# Patient Record
Sex: Male | Born: 1940
Health system: Southern US, Community
[De-identification: ages and names within clinical notes are randomized; demographics above are authoritative.]

## PROBLEM LIST (undated history)

## (undated) DIAGNOSIS — G4733 Obstructive sleep apnea (adult) (pediatric): Secondary | ICD-10-CM

## (undated) DIAGNOSIS — D649 Anemia, unspecified: Secondary | ICD-10-CM

## (undated) DIAGNOSIS — I10 Essential (primary) hypertension: Secondary | ICD-10-CM

## (undated) DIAGNOSIS — S069XAA Unspecified intracranial injury with loss of consciousness status unknown, initial encounter: Secondary | ICD-10-CM

## (undated) DIAGNOSIS — G629 Polyneuropathy, unspecified: Secondary | ICD-10-CM

## (undated) DIAGNOSIS — E785 Hyperlipidemia, unspecified: Secondary | ICD-10-CM

## (undated) DIAGNOSIS — S069X9A Unspecified intracranial injury with loss of consciousness of unspecified duration, initial encounter: Secondary | ICD-10-CM

## (undated) DIAGNOSIS — Z950 Presence of cardiac pacemaker: Secondary | ICD-10-CM

## (undated) DIAGNOSIS — T7840XA Allergy, unspecified, initial encounter: Secondary | ICD-10-CM

## (undated) DIAGNOSIS — K219 Gastro-esophageal reflux disease without esophagitis: Secondary | ICD-10-CM

## (undated) DIAGNOSIS — M1990 Unspecified osteoarthritis, unspecified site: Secondary | ICD-10-CM

## (undated) HISTORY — DX: Allergy, unspecified, initial encounter: T78.40XA

## (undated) HISTORY — DX: Essential (primary) hypertension: I10

## (undated) HISTORY — DX: Anemia, unspecified: D64.9

## (undated) HISTORY — PX: APPENDECTOMY: SHX54

## (undated) HISTORY — PX: ROTATOR CUFF REPAIR: SHX139

## (undated) HISTORY — DX: Unspecified osteoarthritis, unspecified site: M19.90

## (undated) HISTORY — DX: Polyneuropathy, unspecified: G62.9

## (undated) HISTORY — PX: COLONOSCOPY: SHX174

## (undated) HISTORY — DX: Hyperlipidemia, unspecified: E78.5

## (undated) HISTORY — DX: Obstructive sleep apnea (adult) (pediatric): G47.33

## (undated) HISTORY — DX: Gastro-esophageal reflux disease without esophagitis: K21.9

---
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
## 1965-07-13 HISTORY — PX: THORACOTOMY: SUR1349

## 1998-04-08 ENCOUNTER — Ambulatory Visit (HOSPITAL_COMMUNITY): Admission: RE | Admit: 1998-04-08 | Discharge: 1998-04-08 | Payer: Self-pay | Admitting: Ophthalmology

## 1999-07-14 DIAGNOSIS — G629 Polyneuropathy, unspecified: Secondary | ICD-10-CM

## 1999-07-14 HISTORY — DX: Polyneuropathy, unspecified: G62.9

## 2002-05-29 ENCOUNTER — Encounter: Payer: Self-pay | Admitting: Internal Medicine

## 2002-05-29 LAB — CONVERTED CEMR LAB

## 2002-06-12 LAB — HM COLONOSCOPY

## 2002-06-15 ENCOUNTER — Encounter: Payer: Self-pay | Admitting: Internal Medicine

## 2002-10-02 ENCOUNTER — Encounter: Payer: Self-pay | Admitting: Internal Medicine

## 2002-10-02 ENCOUNTER — Encounter: Admission: RE | Admit: 2002-10-02 | Discharge: 2002-10-02 | Payer: Self-pay | Admitting: Internal Medicine

## 2002-11-14 ENCOUNTER — Encounter: Payer: Self-pay | Admitting: Urology

## 2002-11-17 ENCOUNTER — Ambulatory Visit (HOSPITAL_COMMUNITY): Admission: RE | Admit: 2002-11-17 | Discharge: 2002-11-17 | Payer: Self-pay | Admitting: Urology

## 2002-12-27 ENCOUNTER — Inpatient Hospital Stay (HOSPITAL_COMMUNITY): Admission: EM | Admit: 2002-12-27 | Discharge: 2002-12-29 | Payer: Self-pay | Admitting: Emergency Medicine

## 2002-12-27 ENCOUNTER — Encounter: Payer: Self-pay | Admitting: Orthopedic Surgery

## 2002-12-28 ENCOUNTER — Encounter: Payer: Self-pay | Admitting: Orthopedic Surgery

## 2003-12-19 ENCOUNTER — Encounter: Admission: RE | Admit: 2003-12-19 | Discharge: 2003-12-19 | Payer: Self-pay | Admitting: Internal Medicine

## 2004-07-13 HISTORY — PX: ESOPHAGOGASTRODUODENOSCOPY: SHX1529

## 2004-10-03 ENCOUNTER — Ambulatory Visit: Payer: Self-pay | Admitting: Internal Medicine

## 2004-10-06 ENCOUNTER — Ambulatory Visit: Payer: Self-pay | Admitting: Internal Medicine

## 2004-10-16 ENCOUNTER — Ambulatory Visit (HOSPITAL_BASED_OUTPATIENT_CLINIC_OR_DEPARTMENT_OTHER): Admission: RE | Admit: 2004-10-16 | Discharge: 2004-10-16 | Payer: Self-pay | Admitting: Internal Medicine

## 2004-10-16 ENCOUNTER — Encounter: Payer: Self-pay | Admitting: Pulmonary Disease

## 2004-10-30 ENCOUNTER — Ambulatory Visit: Payer: Self-pay | Admitting: Pulmonary Disease

## 2005-01-09 ENCOUNTER — Ambulatory Visit: Payer: Self-pay | Admitting: Pulmonary Disease

## 2005-02-27 ENCOUNTER — Ambulatory Visit: Payer: Self-pay | Admitting: Pulmonary Disease

## 2005-03-31 ENCOUNTER — Ambulatory Visit: Payer: Self-pay | Admitting: Pulmonary Disease

## 2005-04-06 ENCOUNTER — Ambulatory Visit: Payer: Self-pay | Admitting: Internal Medicine

## 2005-04-22 ENCOUNTER — Ambulatory Visit: Payer: Self-pay | Admitting: Internal Medicine

## 2005-05-06 ENCOUNTER — Ambulatory Visit: Payer: Self-pay | Admitting: Internal Medicine

## 2005-05-14 ENCOUNTER — Encounter (INDEPENDENT_AMBULATORY_CARE_PROVIDER_SITE_OTHER): Payer: Self-pay | Admitting: Specialist

## 2005-05-14 ENCOUNTER — Ambulatory Visit: Payer: Self-pay | Admitting: Internal Medicine

## 2005-05-25 ENCOUNTER — Ambulatory Visit: Payer: Self-pay | Admitting: Internal Medicine

## 2005-05-29 ENCOUNTER — Encounter: Admission: RE | Admit: 2005-05-29 | Discharge: 2005-05-29 | Payer: Self-pay | Admitting: Orthopedic Surgery

## 2005-07-24 ENCOUNTER — Ambulatory Visit: Payer: Self-pay | Admitting: Internal Medicine

## 2005-08-19 ENCOUNTER — Ambulatory Visit: Payer: Self-pay | Admitting: Internal Medicine

## 2005-08-20 ENCOUNTER — Ambulatory Visit: Payer: Self-pay | Admitting: Internal Medicine

## 2005-08-21 ENCOUNTER — Ambulatory Visit: Payer: Self-pay | Admitting: Internal Medicine

## 2005-08-25 ENCOUNTER — Ambulatory Visit: Payer: Self-pay | Admitting: Internal Medicine

## 2005-09-22 ENCOUNTER — Ambulatory Visit: Payer: Self-pay | Admitting: Internal Medicine

## 2005-11-03 ENCOUNTER — Ambulatory Visit: Payer: Self-pay | Admitting: Internal Medicine

## 2005-12-02 ENCOUNTER — Ambulatory Visit: Payer: Self-pay | Admitting: Internal Medicine

## 2006-01-06 ENCOUNTER — Ambulatory Visit: Payer: Self-pay | Admitting: Internal Medicine

## 2006-02-03 ENCOUNTER — Ambulatory Visit: Payer: Self-pay | Admitting: Internal Medicine

## 2006-03-16 ENCOUNTER — Ambulatory Visit: Payer: Self-pay | Admitting: Internal Medicine

## 2006-04-14 ENCOUNTER — Ambulatory Visit: Payer: Self-pay | Admitting: Internal Medicine

## 2006-04-15 DIAGNOSIS — E118 Type 2 diabetes mellitus with unspecified complications: Secondary | ICD-10-CM | POA: Insufficient documentation

## 2006-04-15 DIAGNOSIS — D519 Vitamin B12 deficiency anemia, unspecified: Secondary | ICD-10-CM | POA: Insufficient documentation

## 2006-04-15 DIAGNOSIS — J309 Allergic rhinitis, unspecified: Secondary | ICD-10-CM | POA: Insufficient documentation

## 2006-04-15 DIAGNOSIS — I10 Essential (primary) hypertension: Secondary | ICD-10-CM | POA: Insufficient documentation

## 2006-05-19 ENCOUNTER — Ambulatory Visit: Payer: Self-pay | Admitting: Internal Medicine

## 2006-06-23 ENCOUNTER — Ambulatory Visit: Payer: Self-pay | Admitting: Internal Medicine

## 2006-07-28 ENCOUNTER — Ambulatory Visit: Payer: Self-pay | Admitting: Internal Medicine

## 2006-08-23 ENCOUNTER — Ambulatory Visit: Payer: Self-pay | Admitting: Internal Medicine

## 2006-09-22 ENCOUNTER — Ambulatory Visit: Payer: Self-pay | Admitting: Internal Medicine

## 2006-10-29 ENCOUNTER — Ambulatory Visit: Payer: Self-pay | Admitting: Internal Medicine

## 2006-10-29 LAB — CONVERTED CEMR LAB: Vitamin B-12: 394 pg/mL (ref 211–911)

## 2006-12-08 ENCOUNTER — Ambulatory Visit: Payer: Self-pay | Admitting: Internal Medicine

## 2006-12-21 ENCOUNTER — Ambulatory Visit: Payer: Self-pay | Admitting: Internal Medicine

## 2006-12-23 ENCOUNTER — Encounter: Payer: Self-pay | Admitting: Internal Medicine

## 2006-12-23 DIAGNOSIS — H47019 Ischemic optic neuropathy, unspecified eye: Secondary | ICD-10-CM | POA: Insufficient documentation

## 2007-01-13 ENCOUNTER — Ambulatory Visit: Payer: Self-pay | Admitting: Internal Medicine

## 2007-02-22 ENCOUNTER — Ambulatory Visit: Payer: Self-pay | Admitting: Internal Medicine

## 2007-03-02 ENCOUNTER — Ambulatory Visit: Payer: Self-pay | Admitting: Internal Medicine

## 2007-03-04 LAB — CONVERTED CEMR LAB
Basophils Absolute: 0 10*3/uL (ref 0.0–0.1)
Basophils Relative: 0.5 % (ref 0.0–1.0)
Eosinophils Absolute: 0.2 10*3/uL (ref 0.0–0.6)
Eosinophils Relative: 3.6 % (ref 0.0–5.0)
Ferritin: 61.4 ng/mL (ref 22.0–322.0)
Folate: 9.8 ng/mL
HCT: 36.2 % — ABNORMAL LOW (ref 39.0–52.0)
Hemoglobin: 12.4 g/dL — ABNORMAL LOW (ref 13.0–17.0)
Iron: 56 ug/dL (ref 42–165)
Lymphocytes Relative: 23 % (ref 12.0–46.0)
MCHC: 34.1 g/dL (ref 30.0–36.0)
MCV: 82.8 fL (ref 78.0–100.0)
Monocytes Absolute: 0.7 10*3/uL (ref 0.2–0.7)
Monocytes Relative: 11 % (ref 3.0–11.0)
Neutro Abs: 4 10*3/uL (ref 1.4–7.7)
Neutrophils Relative %: 61.9 % (ref 43.0–77.0)
Platelets: 199 10*3/uL (ref 150–400)
RBC: 4.37 M/uL (ref 4.22–5.81)
RDW: 12.9 % (ref 11.5–14.6)
Transferrin: 249.4 mg/dL (ref >=212.0)
Vitamin B-12: 529 pg/mL (ref 211–911)
WBC: 6.3 10*3/uL (ref 4.5–10.5)

## 2007-04-12 ENCOUNTER — Ambulatory Visit: Payer: Self-pay | Admitting: Internal Medicine

## 2007-05-10 ENCOUNTER — Ambulatory Visit: Payer: Self-pay | Admitting: Internal Medicine

## 2007-06-01 ENCOUNTER — Ambulatory Visit: Payer: Self-pay | Admitting: Family Medicine

## 2007-06-21 ENCOUNTER — Ambulatory Visit: Payer: Self-pay | Admitting: Internal Medicine

## 2007-06-21 DIAGNOSIS — N400 Enlarged prostate without lower urinary tract symptoms: Secondary | ICD-10-CM | POA: Insufficient documentation

## 2007-07-27 ENCOUNTER — Ambulatory Visit: Payer: Self-pay | Admitting: Internal Medicine

## 2007-07-27 DIAGNOSIS — E785 Hyperlipidemia, unspecified: Secondary | ICD-10-CM | POA: Insufficient documentation

## 2007-07-29 LAB — CONVERTED CEMR LAB
Basophils Absolute: 0 10*3/uL (ref 0.0–0.1)
Basophils Relative: 0.6 % (ref 0.0–1.0)
Eosinophils Absolute: 0.2 10*3/uL (ref 0.0–0.6)
Eosinophils Relative: 3.7 % (ref 0.0–5.0)
Ferritin: 47 ng/mL (ref 22.0–322.0)
HCT: 34.9 % — ABNORMAL LOW (ref 39.0–52.0)
Hemoglobin: 11.6 g/dL — ABNORMAL LOW (ref 13.0–17.0)
Iron: 77 ug/dL (ref 42–165)
Lymphocytes Relative: 22.8 % (ref 12.0–46.0)
MCHC: 33.4 g/dL (ref 30.0–36.0)
MCV: 83.7 fL (ref 78.0–100.0)
Monocytes Absolute: 0.6 10*3/uL (ref 0.2–0.7)
Monocytes Relative: 9.7 % (ref 3.0–11.0)
Neutro Abs: 4.1 10*3/uL (ref 1.4–7.7)
Neutrophils Relative %: 63.2 % (ref 43.0–77.0)
Platelets: 179 10*3/uL (ref 150–400)
RBC: 4.17 M/uL — ABNORMAL LOW (ref 4.22–5.81)
RDW: 13.1 % (ref 11.5–14.6)
Saturation Ratios: 23.3 % (ref 20.0–50.0)
Transferrin: 235.9 mg/dL (ref >=212.0)
WBC: 6.3 10*3/uL (ref 4.5–10.5)

## 2007-08-24 ENCOUNTER — Ambulatory Visit: Payer: Self-pay | Admitting: Internal Medicine

## 2007-09-21 ENCOUNTER — Ambulatory Visit: Payer: Self-pay | Admitting: Internal Medicine

## 2007-10-27 ENCOUNTER — Ambulatory Visit: Payer: Self-pay | Admitting: Internal Medicine

## 2007-10-28 ENCOUNTER — Telehealth: Payer: Self-pay | Admitting: Internal Medicine

## 2007-11-23 ENCOUNTER — Ambulatory Visit: Payer: Self-pay | Admitting: Internal Medicine

## 2007-12-12 ENCOUNTER — Telehealth: Payer: Self-pay | Admitting: Internal Medicine

## 2007-12-13 ENCOUNTER — Telehealth (INDEPENDENT_AMBULATORY_CARE_PROVIDER_SITE_OTHER): Payer: Self-pay | Admitting: *Deleted

## 2007-12-15 ENCOUNTER — Telehealth (INDEPENDENT_AMBULATORY_CARE_PROVIDER_SITE_OTHER): Payer: Self-pay | Admitting: *Deleted

## 2007-12-21 ENCOUNTER — Encounter: Payer: Self-pay | Admitting: Internal Medicine

## 2007-12-28 ENCOUNTER — Ambulatory Visit: Payer: Self-pay | Admitting: Internal Medicine

## 2008-02-07 ENCOUNTER — Ambulatory Visit: Payer: Self-pay | Admitting: Internal Medicine

## 2008-03-07 ENCOUNTER — Ambulatory Visit: Payer: Self-pay | Admitting: Internal Medicine

## 2008-04-05 ENCOUNTER — Ambulatory Visit: Payer: Self-pay | Admitting: Internal Medicine

## 2008-05-23 ENCOUNTER — Ambulatory Visit: Payer: Self-pay | Admitting: Internal Medicine

## 2008-06-20 ENCOUNTER — Ambulatory Visit: Payer: Self-pay | Admitting: Internal Medicine

## 2008-07-19 ENCOUNTER — Ambulatory Visit: Payer: Self-pay | Admitting: Internal Medicine

## 2008-07-23 LAB — CONVERTED CEMR LAB
ALT: 23 units/L (ref 0–53)
AST: 22 units/L (ref 0–37)
Albumin: 3.8 g/dL (ref 3.5–5.2)
Alkaline Phosphatase: 49 units/L (ref 39–117)
BUN: 22 mg/dL (ref 6–23)
Basophils Absolute: 0 10*3/uL (ref 0.0–0.1)
Basophils Relative: 0.7 % (ref 0.0–3.0)
Bilirubin, Direct: 0.1 mg/dL (ref 0.0–0.3)
CO2: 31 meq/L (ref 19–32)
Calcium: 9.5 mg/dL (ref 8.4–10.5)
Chloride: 103 meq/L (ref 96–112)
Cholesterol: 108 mg/dL (ref 0–200)
Creatinine, Ser: 0.9 mg/dL (ref 0.4–1.5)
Eosinophils Absolute: 0.3 10*3/uL (ref 0.0–0.7)
Eosinophils Relative: 5 % (ref 0.0–5.0)
GFR calc Af Amer: 108 mL/min
GFR calc non Af Amer: 89 mL/min
Glucose, Bld: 99 mg/dL (ref 70–99)
HCT: 36.2 % — ABNORMAL LOW (ref 39.0–52.0)
HDL: 55 mg/dL (ref >39.0)
Hemoglobin: 12.2 g/dL — ABNORMAL LOW (ref 13.0–17.0)
Hgb A1c MFr Bld: 6.4 % — ABNORMAL HIGH (ref 4.6–6.0)
LDL Cholesterol: 42 mg/dL (ref 0–99)
Lymphocytes Relative: 21.1 % (ref 12.0–46.0)
MCHC: 33.6 g/dL (ref 30.0–36.0)
MCV: 84.6 fL (ref 78.0–100.0)
Monocytes Absolute: 0.6 10*3/uL (ref 0.1–1.0)
Monocytes Relative: 11.7 % (ref 3.0–12.0)
Neutro Abs: 3.2 10*3/uL (ref 1.4–7.7)
Neutrophils Relative %: 61.5 % (ref 43.0–77.0)
Platelets: 142 10*3/uL — ABNORMAL LOW (ref 150–400)
Potassium: 4.7 meq/L (ref 3.5–5.1)
RBC: 4.28 M/uL (ref 4.22–5.81)
RDW: 13.3 % (ref 11.5–14.6)
Sodium: 140 meq/L (ref 135–145)
Total Bilirubin: 0.6 mg/dL (ref 0.3–1.2)
Total CHOL/HDL Ratio: 2
Total Protein: 6.4 g/dL (ref 6.0–8.3)
Triglycerides: 56 mg/dL (ref 0–149)
VLDL: 11 mg/dL (ref 0–40)
Vitamin B-12: 553 pg/mL (ref 211–911)
WBC: 5.2 10*3/uL (ref 4.5–10.5)

## 2008-08-14 ENCOUNTER — Ambulatory Visit: Payer: Self-pay | Admitting: Internal Medicine

## 2008-09-13 ENCOUNTER — Ambulatory Visit: Payer: Self-pay | Admitting: Pulmonary Disease

## 2008-09-13 DIAGNOSIS — G4730 Sleep apnea, unspecified: Secondary | ICD-10-CM | POA: Insufficient documentation

## 2008-09-13 DIAGNOSIS — G4733 Obstructive sleep apnea (adult) (pediatric): Secondary | ICD-10-CM | POA: Insufficient documentation

## 2008-09-19 ENCOUNTER — Ambulatory Visit: Payer: Self-pay | Admitting: Internal Medicine

## 2008-09-20 ENCOUNTER — Encounter: Payer: Self-pay | Admitting: Pulmonary Disease

## 2008-11-12 ENCOUNTER — Ambulatory Visit: Payer: Self-pay | Admitting: Internal Medicine

## 2008-12-19 ENCOUNTER — Telehealth: Payer: Self-pay | Admitting: Internal Medicine

## 2008-12-19 ENCOUNTER — Ambulatory Visit: Payer: Self-pay | Admitting: Internal Medicine

## 2009-01-16 ENCOUNTER — Ambulatory Visit: Payer: Self-pay | Admitting: Internal Medicine

## 2009-02-20 ENCOUNTER — Ambulatory Visit: Payer: Self-pay | Admitting: Internal Medicine

## 2009-03-27 ENCOUNTER — Ambulatory Visit: Payer: Self-pay | Admitting: Internal Medicine

## 2009-04-17 ENCOUNTER — Ambulatory Visit: Payer: Self-pay | Admitting: Internal Medicine

## 2009-05-13 ENCOUNTER — Ambulatory Visit: Payer: Self-pay | Admitting: Internal Medicine

## 2009-06-12 ENCOUNTER — Ambulatory Visit: Payer: Self-pay | Admitting: Internal Medicine

## 2009-06-12 LAB — HM DIABETES EYE EXAM

## 2009-07-13 HISTORY — PX: OTHER SURGICAL HISTORY: SHX169

## 2009-07-17 ENCOUNTER — Ambulatory Visit: Payer: Self-pay | Admitting: Internal Medicine

## 2009-07-17 LAB — HM DIABETES FOOT EXAM

## 2009-08-14 ENCOUNTER — Ambulatory Visit: Payer: Self-pay | Admitting: Internal Medicine

## 2009-09-11 ENCOUNTER — Ambulatory Visit: Payer: Self-pay | Admitting: Internal Medicine

## 2009-09-13 ENCOUNTER — Telehealth: Payer: Self-pay | Admitting: Internal Medicine

## 2009-10-24 ENCOUNTER — Ambulatory Visit: Payer: Self-pay | Admitting: Internal Medicine

## 2009-11-22 ENCOUNTER — Ambulatory Visit: Payer: Self-pay | Admitting: Internal Medicine

## 2009-11-25 LAB — CONVERTED CEMR LAB: Vit D, 25-Hydroxy: 22 ng/mL — ABNORMAL LOW (ref 30–89)

## 2009-12-04 ENCOUNTER — Ambulatory Visit: Payer: Self-pay | Admitting: Internal Medicine

## 2009-12-04 ENCOUNTER — Telehealth: Payer: Self-pay | Admitting: Internal Medicine

## 2009-12-25 ENCOUNTER — Ambulatory Visit: Payer: Self-pay | Admitting: Internal Medicine

## 2009-12-25 DIAGNOSIS — F329 Major depressive disorder, single episode, unspecified: Secondary | ICD-10-CM

## 2009-12-25 DIAGNOSIS — F32A Depression, unspecified: Secondary | ICD-10-CM | POA: Insufficient documentation

## 2009-12-27 ENCOUNTER — Ambulatory Visit: Payer: Self-pay | Admitting: Internal Medicine

## 2010-01-01 ENCOUNTER — Ambulatory Visit: Payer: Self-pay | Admitting: Internal Medicine

## 2010-01-08 ENCOUNTER — Ambulatory Visit: Payer: Self-pay | Admitting: Family Medicine

## 2010-01-16 ENCOUNTER — Ambulatory Visit
Admission: RE | Admit: 2010-01-16 | Discharge: 2010-01-16 | Payer: Self-pay | Source: Home / Self Care | Admitting: Orthopedic Surgery

## 2010-01-16 ENCOUNTER — Encounter: Payer: Self-pay | Admitting: Emergency Medicine

## 2010-01-16 ENCOUNTER — Ambulatory Visit: Payer: Self-pay | Admitting: Diagnostic Radiology

## 2010-01-17 ENCOUNTER — Encounter: Payer: Self-pay | Admitting: Internal Medicine

## 2010-02-05 ENCOUNTER — Ambulatory Visit: Payer: Self-pay | Admitting: Internal Medicine

## 2010-02-17 ENCOUNTER — Encounter: Payer: Self-pay | Admitting: Internal Medicine

## 2010-03-19 ENCOUNTER — Telehealth: Payer: Self-pay | Admitting: Internal Medicine

## 2010-03-19 ENCOUNTER — Ambulatory Visit: Payer: Self-pay | Admitting: Internal Medicine

## 2010-03-19 LAB — CONVERTED CEMR LAB
ALT: 27 units/L (ref 0–53)
AST: 26 units/L (ref 0–37)
Albumin: 3.7 g/dL (ref 3.5–5.2)
Alkaline Phosphatase: 59 units/L (ref 39–117)
BUN: 20 mg/dL (ref 6–23)
Basophils Absolute: 0 10*3/uL (ref 0.0–0.1)
Basophils Relative: 0.6 % (ref 0.0–3.0)
Bilirubin, Direct: 0.1 mg/dL (ref 0.0–0.3)
CO2: 33 meq/L — ABNORMAL HIGH (ref 19–32)
Calcium: 9.2 mg/dL (ref 8.4–10.5)
Chloride: 97 meq/L (ref 96–112)
Creatinine, Ser: 0.9 mg/dL (ref 0.4–1.5)
Eosinophils Absolute: 0.3 10*3/uL (ref 0.0–0.7)
Eosinophils Relative: 3.9 % (ref 0.0–5.0)
GFR calc non Af Amer: 89.43 mL/min (ref >60)
Glucose, Bld: 100 mg/dL — ABNORMAL HIGH (ref 70–99)
HCT: 35.5 % — ABNORMAL LOW (ref 39.0–52.0)
Hemoglobin: 11.9 g/dL — ABNORMAL LOW (ref 13.0–17.0)
Lymphocytes Relative: 22.5 % (ref 12.0–46.0)
Lymphs Abs: 1.5 10*3/uL (ref 0.7–4.0)
MCHC: 33.4 g/dL (ref 30.0–36.0)
MCV: 85 fL (ref 78.0–100.0)
Monocytes Absolute: 0.8 10*3/uL (ref 0.1–1.0)
Monocytes Relative: 12 % (ref 3.0–12.0)
Neutro Abs: 3.9 10*3/uL (ref 1.4–7.7)
Neutrophils Relative %: 61 % (ref 43.0–77.0)
Platelets: 175 10*3/uL (ref 150.0–400.0)
Potassium: 5 meq/L (ref 3.5–5.1)
RBC: 4.18 M/uL — ABNORMAL LOW (ref 4.22–5.81)
RDW: 14.7 % — ABNORMAL HIGH (ref 11.5–14.6)
Sodium: 136 meq/L (ref 135–145)
Total Bilirubin: 0.6 mg/dL (ref 0.3–1.2)
Total Protein: 6 g/dL (ref 6.0–8.3)
WBC: 6.5 10*3/uL (ref 4.5–10.5)

## 2010-03-24 LAB — CONVERTED CEMR LAB
Tissue Transglutaminase Ab, IgA: 8 units (ref <20)
Vit D, 25-Hydroxy: 31 ng/mL (ref 30–89)

## 2010-04-16 ENCOUNTER — Ambulatory Visit: Payer: Self-pay | Admitting: Internal Medicine

## 2010-05-14 ENCOUNTER — Ambulatory Visit: Payer: Self-pay | Admitting: Internal Medicine

## 2010-06-18 ENCOUNTER — Ambulatory Visit: Payer: Self-pay | Admitting: Internal Medicine

## 2010-06-25 ENCOUNTER — Ambulatory Visit: Payer: Self-pay | Admitting: Internal Medicine

## 2010-06-25 LAB — CONVERTED CEMR LAB: Vitamin B-12: 927 pg/mL — ABNORMAL HIGH (ref 211–911)

## 2010-07-13 LAB — HM DIABETES EYE EXAM: HM Diabetic Eye Exam: NORMAL

## 2010-07-23 ENCOUNTER — Ambulatory Visit
Admission: RE | Admit: 2010-07-23 | Discharge: 2010-07-23 | Payer: Self-pay | Source: Home / Self Care | Attending: Internal Medicine | Admitting: Internal Medicine

## 2010-07-23 ENCOUNTER — Encounter: Payer: Self-pay | Admitting: Internal Medicine

## 2010-07-23 DIAGNOSIS — E559 Vitamin D deficiency, unspecified: Secondary | ICD-10-CM | POA: Insufficient documentation

## 2010-07-24 LAB — CONVERTED CEMR LAB: Vit D, 25-Hydroxy: 30 ng/mL (ref 30–89)

## 2010-08-02 ENCOUNTER — Encounter: Payer: Self-pay | Admitting: Orthopedic Surgery

## 2010-08-10 LAB — CONVERTED CEMR LAB
Basophils Absolute: 0 10*3/uL (ref 0.0–0.1)
Basophils Relative: 0.5 % (ref 0.0–3.0)
Eosinophils Absolute: 0.3 10*3/uL (ref 0.0–0.7)
Eosinophils Relative: 5 % (ref 0.0–5.0)
Ferritin: 36.8 ng/mL (ref 22.0–322.0)
HCT: 36.5 % — ABNORMAL LOW (ref 39.0–52.0)
Hemoglobin: 11.6 g/dL — ABNORMAL LOW (ref 13.0–17.0)
Iron: 56 ug/dL (ref 42–165)
Lymphocytes Relative: 25.7 % (ref 12.0–46.0)
Lymphs Abs: 1.3 10*3/uL (ref 0.7–4.0)
MCHC: 31.7 g/dL (ref 30.0–36.0)
MCV: 87.2 fL (ref 78.0–100.0)
Monocytes Absolute: 0.5 10*3/uL (ref 0.1–1.0)
Monocytes Relative: 9.6 % (ref 3.0–12.0)
Neutro Abs: 3 10*3/uL (ref 1.4–7.7)
Neutrophils Relative %: 59.2 % (ref 43.0–77.0)
Platelets: 151 10*3/uL (ref 150.0–400.0)
RBC: 4.19 M/uL — ABNORMAL LOW (ref 4.22–5.81)
RDW: 14.2 % (ref 11.5–14.6)
Saturation Ratios: 16 % — ABNORMAL LOW (ref 20.0–50.0)
TSH: 0.98 microintl units/mL (ref 0.35–5.50)
Transferrin: 249.4 mg/dL (ref 212.0–360.0)
Vit D, 25-Hydroxy: 16 ng/mL — ABNORMAL LOW (ref 30–89)
WBC: 5.1 10*3/uL (ref 4.5–10.5)

## 2010-08-13 ENCOUNTER — Ambulatory Visit: Admit: 2010-08-13 | Payer: Self-pay | Admitting: Internal Medicine

## 2010-08-14 NOTE — Assessment & Plan Note (Signed)
Summary: B-12INJ/RCD   Nurse Visit   Allergies: 1)  Sulfamethoxazole (Sulfamethoxazole) 2)  Penicillin V Potassium (Penicillin V Potassium)  Medication Administration  Injection # 1:    Medication: Vit B12 1000 mcg    Diagnosis: ANEMIA-NOS (ICD-285.9)    Route: IM    Site: L deltoid    Exp Date: 7/13    Lot #: [id number]    Mfr: American Regent    Patient tolerated injection without complications    Given by: Alfred Levins, CMA (July 23, 2010 1:25 PM)  Orders Added: 1)  Venipuncture [16109] 2)  Specimen Handling [99000] 3)  T-Vitamin D (25-Hydroxy) [60454-09811] 4)  Vit B12 1000 mcg [J3420] 5)  Admin of Therapeutic Inj  intramuscular or subcutaneous [91478]

## 2010-08-14 NOTE — Letter (Signed)
Summary: CPAP Supp[facility]  CPAP Supplies/[facility]   Imported By: Sherian Rein 10/02/2008 10:59:18  _____________________________________________________________________  External Attachment:    Type:   Image     Comment:   External Document

## 2010-08-14 NOTE — Progress Notes (Signed)
Summary: Labs/Injection today  Phone Note Call from Patient Call back at [phone]   Summary of Call: Patient called with questions about visit today and if he is due for labs or just B-12 inj. I made patient aware that he is due for labs per his July visit and we would draw them here today when he comes for the B-12 inj. Initial call taken by: Lucious Groves CMA,  March 19, 2010 9:18 AM

## 2010-08-14 NOTE — Progress Notes (Signed)
Summary: would like earlier appt about carpal tunnel   Phone Note Call from Patient Call back at [phone]   Caller: pt vm triage Call For: Swords Reason for Call: Lab or Test Results Summary of Call: Has appt  about his carpal tunnel on June 19th would like earlier appt if possible.   Initial call taken by: Roselle Locus,  December 13, 2007 11:12 AM  Follow-up for Phone Call        Left message for patient to return my call. Need information on physician Follow-up by: Florentina Addison,  December 14, 2007 12:47 PM  Additional Follow-up for Phone Call Additional follow up Details #1::        Spoke with patient/Dr. [name] Pea will not have any appts before 06/19/office will put him on the cancellation list. Additional Follow-up by: Florentina Addison,  December 14, 2007 3:03 PM

## 2010-08-14 NOTE — Assessment & Plan Note (Signed)
Summary: B12 INJ / RS   Nurse Visit   Allergies: 1)  Sulfamethoxazole (Sulfamethoxazole) 2)  Penicillin V Potassium (Penicillin V Potassium)  Medication Administration  Injection # 1:    Medication: Vit B12 1000 mcg    Diagnosis: ANEMIA-NOS (ICD-285.9)    Route: IM    Site: R deltoid    Exp Date: 03/14/2011    Lot #: [id number]    Mfr: American Regent    Patient tolerated injection without complications    Given by: Gladis Riffle, RN (January 01, 2010 2:12 PM)  Orders Added: 1)  Vit B12 1000 mcg [J3420] 2)  Admin of Therapeutic Inj  intramuscular or subcutaneous [96372]   Medication Administration  Injection # 1:    Medication: Vit B12 1000 mcg    Diagnosis: ANEMIA-NOS (ICD-285.9)    Route: IM    Site: R deltoid    Exp Date: 03/14/2011    Lot #: [id number]    Mfr: American Regent    Patient tolerated injection without complications    Given by: Gladis Riffle, RN (January 01, 2010 2:12 PM)  Orders Added: 1)  Vit B12 1000 mcg [J3420] 2)  Admin of Therapeutic Inj  intramuscular or subcutaneous [54098]

## 2010-08-14 NOTE — Assessment & Plan Note (Signed)
Summary: b-12 inj/cjr   Nurse Visit   Allergies: 1)  Sulfamethoxazole (Sulfamethoxazole) 2)  Penicillin V Potassium (Penicillin V Potassium)  Medication Administration  Injection # 1:    Medication: Vit B12 1000 mcg    Diagnosis: ANEMIA-NOS (ICD-285.9)    Route: IM    Site: L deltoid    Exp Date: 10/10/2009    Lot #: [id number]    Mfr: American Regent    Patient tolerated injection without complications    Given by: Willy Eddy, LPN (May 13, 2009 1:29 PM)  Orders Added: 1)  Vit B12 1000 mcg [J3420] 2)  Admin of Therapeutic Inj  intramuscular or subcutaneous [96045]

## 2010-08-14 NOTE — Assessment & Plan Note (Signed)
Summary: b12 inj/njr   Nurse Visit   Allergies: 1)  Sulfamethoxazole (Sulfamethoxazole) 2)  Penicillin V Potassium (Penicillin V Potassium)  Medication Administration  Injection # 1:    Medication: Vit B12 1000 mcg    Diagnosis: ANEMIA-NOS (ICD-285.9)    Route: IM    Site: L deltoid    Exp Date: 01/11/2012    Lot #: [id number]    Mfr: American Regent    Patient tolerated injection without complications    Given by: Kern Reap CMA (AAMA) (May 14, 2010 2:00 PM)  Orders Added: 1)  Vit B12 1000 mcg [J3420] 2)  Admin of Therapeutic Inj  intramuscular or subcutaneous [16109]

## 2010-08-14 NOTE — Assessment & Plan Note (Signed)
Summary: B -12 INJ/RCD  Nurse Visit    Prior Medications: ACTOS 45 MG TABS (PIOGLITAZONE HCL) Take 1 tablet by mouth once a day ASPIR-81 81 MG TBEC (ASPIRIN) Take 1 tablet by mouth once a day CYANOCOBALAMIN 1000 MCG/ML SOLN (CYANOCOBALAMIN) Inject GLIPIZIDE 10 MG TABS (GLIPIZIDE) Take 1 tablet by mouth once a day JANUVIA 100 MG  TABS (SITAGLIPTIN PHOSPHATE) Take 1 tablet by mouth once a day LIPITOR 20 MG TABS (ATORVASTATIN CALCIUM) Take 1 tablet by mouth once a day LISINOPRIL 5 MG TABS (LISINOPRIL) Take 1 tablet by mouth once a day METFORMIN HCL 1000 MG TABS (METFORMIN HCL) Take 1 tablet by mouth twice a day CALCIUM 600/VITAMIN D 600-200 MG-UNIT  TABS (CALCIUM CARBONATE-VITAMIN D)  IRON 28 MG  TABS (FERROUS SULFATE) once daily NASONEX 50 MCG/ACT SUSP (MOMETASONE FUROATE) 1-2 each nostril qd DOXAZOSIN MESYLATE 2 MG TABS (DOXAZOSIN MESYLATE) 1 by mouth at bedtime or as directed Current Allergies: SULFAMETHOXAZOLE (SULFAMETHOXAZOLE) PENICILLIN V POTASSIUM (PENICILLIN V POTASSIUM)    Medication Administration  Injection # 1:    Medication: Vit B12 1000 mcg    Diagnosis: ANEMIA-NOS (ICD-285.9)    Route: IM    Site: L deltoid    Exp Date: 11/10/2008    Lot #: [id number]    Mfr: American Regent    Patient tolerated injection without complications    Given by: Willy Eddy, LPN (October 27, 2007 3:57 PM)  Orders Added: 1)  Vit B12 1000 mcg [J3420] 2)  Admin of Therapeutic Inj  intramuscular or subcutaneous Lepidus.[place]    ]

## 2010-08-14 NOTE — Progress Notes (Signed)
Summary: return call  Phone Note Call from Patient   Caller: Patient Call For: Alvino Chapel Summary of Call: returned call ph- [phone] Initial call taken by: Raechel Ache, RN,  September 13, 2009 2:59 PM  Follow-up for Phone Call        only has one Vit D 50000 U left.  Told when he completes to call and make appt for lab to draw Vit D in 4 weeks.  Meanwhile can go back on OTC Vit D 1000U.Patient notified.  Follow-up by: Gladis Riffle, RN,  September 16, 2009 4:24 PM    New/Updated Medications: VITAMIN D 1000 UNIT TABS (CHOLECALCIFEROL) once daily

## 2010-08-14 NOTE — Assessment & Plan Note (Signed)
Summary: ? sore throat//ccm   Vital Signs:  Patient profile:   70 year old male Temp:     98.4 degrees F oral BP sitting:   130 / 64  (left arm) Cuff size:   large  Vitals Entered By: Sid Falcon LPN (January 08, 2010 12:19 PM) CC: sore throat, bronchitis   History of Present Illness: Patient is seen with one day history of sore throat and cough productive of yellow sputum. No fever or chills. Prior history of pneumonia. Has history of diabetes which has been well-controlled. Able to sleep okay last night. Has taken Afrin and Claritin-D with some improvement in nasal congestive symptoms. Denies any nausea, vomiting, or diarrhea. Ex smoker quit over 25 years ago.  Allergies: 1)  Sulfamethoxazole (Sulfamethoxazole) 2)  Penicillin V Potassium (Penicillin V Potassium)  Past History:  Past Medical History: Last updated: 09/13/2008 Allergic rhinitis Diabetes mellitus, type II Hypertension Ischemic optic neuropathy L 2001 Anemia-NOS iron deficient and B12 deficient Hyperlipidemia OSA PMH reviewed for relevance  Review of Systems      See HPI  Physical Exam  General:  Well-developed,well-nourished,in no acute distress; alert,appropriate and cooperative throughout examination Head:  Normocephalic and atraumatic without obvious abnormalities. No apparent alopecia or balding. Ears:  External ear exam shows no significant lesions or deformities.  Otoscopic examination reveals clear canals, tympanic membranes are intact bilaterally without bulging, retraction, inflammation or discharge. Hearing is grossly normal bilaterally. Nose:  External nasal examination shows no deformity or inflammation. Nasal mucosa are pink and moist without lesions or exudates. Mouth:  Oral mucosa and oropharynx without lesions or exudates.  Teeth in good repair. Neck:  No deformities, masses, or tenderness noted. Lungs:  Normal respiratory effort, chest expands symmetrically. Lungs are clear to auscultation,  no crackles or wheezes. Heart:  normal rate and regular rhythm.     Impression & Recommendations:  Problem # 1:  ACUTE BRONCHITIS (ICD-466.0)  suspect viral origin. We've written prescription to fill only if he develops fever or worsening symptoms over the next several days. His updated medication list for this problem includes:    Azithromycin 250 Mg Tabs (Azithromycin) .[name]... 2 by mouth day one then one by mouth once daily for 4 days.  Orders: Prescription Created Electronically [phone]  Complete Medication List: 1)  Actos 45 Mg Tabs (Pioglitazone hcl) .... Take 1 tablet by mouth once a day 2)  Aspir-81 81 Mg Tbec (Aspirin) .... Take 1 tablet by mouth once a day 3)  Cyanocobalamin 1000 Mcg/ml Soln (Cyanocobalamin) .... Inject 4)  Glipizide 10 Mg Tabs (Glipizide) .... Take 1 tablet by mouth once a day 5)  Januvia 100 Mg Tabs (Sitagliptin phosphate) .... Take 1 tablet by mouth once a day 6)  Lipitor 20 Mg Tabs (Atorvastatin calcium) .... Take 1 tablet by mouth once a day 7)  Lisinopril 5 Mg Tabs (Lisinopril) .... Take 1 tablet by mouth once a day 8)  Metformin Hcl 1000 Mg Tabs (Metformin hcl) .... Take 1 tablet by mouth twice a day 9)  Calcium 600/vitamin D 600-200 Mg-unit Tabs (Calcium carbonate-vitamin d) .... Once daily 10)  Iron 28 Mg Tabs (Ferrous sulfate) .... Once daily 11)  Doxazosin Mesylate 4 Mg Tabs (Doxazosin mesylate) .... Take 1 tablet by mouth once a day 12)  Multivitamins Tabs (Multiple vitamin) .... Once daily 13)  Vitamin D 1000 Unit Tabs (Cholecalciferol) .... Once daily--hold while on 50000u 14)  D3-50 50000 Unit Caps (Cholecalciferol) .... Take on once weekly x 12 weeks--call for  vit d level for 4 weeks after completion 15)  Nystatin-triamcinolone 100000-0.1 Unit/gm-% Crea (Nystatin-triamcinolone) .... Apply two times a day to affected area 16)  Citalopram Hydrobromide 10 Mg Tabs (Citalopram hydrobromide) .... Take 1 tab by mouth daily 17)  Azithromycin 250 Mg Tabs  (Azithromycin) .... 2 by mouth day one then one by mouth once daily for 4 days.  Other Orders: Rapid Strep (16109)  Patient Instructions: 1)  Acute Bronchitis symptoms for less then 10 days are not  helped by antibiotics. Take over the counter cough medications. Call if no improvement in 5-7 days, sooner if increasing cough, fever, or new symptoms ( shortness of breath, chest pain) .  Prescriptions: AZITHROMYCIN 250 MG TABS (AZITHROMYCIN) 2 by mouth day one then one by mouth once daily for 4 days.  #6 x 0   Entered and Authorized by:   Evelena Peat MD   Signed by:   Evelena Peat MD on 01/08/2010   Method used:   Print then Give to Patient   RxID:   [id number]

## 2010-08-14 NOTE — Progress Notes (Signed)
Summary: request  Phone Note Call from Patient Call back at Home Phone [phone]   Caller: Patient live Call For: Birdie Sons MD Summary of Call: requests Rx cortisone cream to [facility].  Has had in past from urologist for yeast infection but has not seen him in 5 years.  OTC not strong enough. Initial call taken by: Gladis Riffle, RN,  Dec 04, 2009 1:45 PM  Follow-up for Phone Call        more infor. is this "jock itch" Follow-up by: Birdie Sons MD,  Dec 04, 2009 4:50 PM  Additional Follow-up for Phone Call Additional follow up Details #1::        Left message on machine. Pt to call back. Gladis Riffle, RN  Dec 05, 2009 7:51 AM     Additional Follow-up for Phone Call Additional follow up Details #2::    yes, jock itch.   Follow-up by: Gladis Riffle, RN,  Dec 05, 2009 12:33 PM  Additional Follow-up for Phone Call Additional follow up Details #3:: Details for Additional Follow-up Action Taken: see rx.Patient notified.  Additional Follow-up by: Gladis Riffle, RN,  Dec 06, 2009 12:33 PM  New/Updated Medications: NYSTATIN-TRIAMCINOLONE 100000-0.1 UNIT/GM-% CREA (NYSTATIN-TRIAMCINOLONE) apply two times a day to affected area Prescriptions: NYSTATIN-TRIAMCINOLONE 100000-0.1 UNIT/GM-% CREA (NYSTATIN-TRIAMCINOLONE) apply two times a day to affected area  #30 grams x 1   Entered and Authorized by:   Birdie Sons MD   Signed by:   Birdie Sons MD on 12/06/2009   Method used:   Electronically to        [facility]  [phone]* (retail)       [address]       [place], [place]  [address]       Ph: [phone] or [phone]       Fax: [phone]   RxID:   [id number]

## 2010-08-14 NOTE — Assessment & Plan Note (Signed)
Summary: 6 wk rov/b12 inj/njr/pt rescd from bump//ccm   Vital Signs:  Patient profile:   70 year old male Weight:      228 pounds Temp:     98.1 degrees F oral Pulse rate:   82 / minute BP sitting:   110 / 52  (left arm) Cuff size:   large  Vitals Entered By: Kathrynn Speed CMA (February 05, 2010 10:31 AM) CC: 6 wk fu, B12 injection, src   CC:  6 wk fu, B12 injection, and src.  History of Present Illness:  Follow-Up Visit      This is a 70 year old man who presents for Follow-up visit.  The patient denies chest pain and palpitations.  Since the last visit the patient notes no new problems or concerns.  The patient reports taking meds as prescribed.  When questioned about possible medication side effects, the patient notes none.    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 Take 1 Tablet By Mouth Once A Day 2)  Aspir-81 81 Mg Tbec (Aspirin) .... Take 1 Tablet By Mouth Once A Day 3)  Cyanocobalamin 1000 Mcg/ml Soln (Cyanocobalamin) .... Inject 4)  Glipizide 10 Mg Tabs (Glipizide) .... Take 1 Tablet By Mouth Once A Day 5)  Januvia 100 Mg  Tabs (Sitagliptin Phosphate) .... Take 1 Tablet By Mouth Once A Day 6)  Lipitor 20 Mg Tabs (Atorvastatin Calcium) .... Take 1 Tablet By Mouth Once A Day 7)  Lisinopril 5 Mg Tabs (Lisinopril) .... Take 1 Tablet By Mouth Once A Day 8)  Metformin Hcl 1000 Mg Tabs (Metformin Hcl) .... Take 1 Tablet By Mouth Twice A Day 9)  Calcium 600/vitamin D 600-200 Mg-Unit  Tabs (Calcium Carbonate-Vitamin D) .... Once  Daily 10)  Iron 28 Mg  Tabs (Ferrous Sulfate) .... Two Times A Day 11)  Doxazosin Mesylate 4 Mg Tabs (Doxazosin Mesylate) .... Take 1 Tablet By Mouth Once A Day 12)  Multivitamins  Tabs (Multiple Vitamin) .... Once Daily 13)  Vitamin D 1000 Unit Tabs (Cholecalciferol) .... Once Daily--Hold While On 50000u 14)  D3-50 50000 Unit Caps (Cholecalciferol) .... Take On Once Weekly X 12 Weeks--Call For Vit D Level For 4 Weeks After Completion 15)  Nystatin-Triamcinolone 100000-0.1 Unit/gm-% Crea (Nystatin-Triamcinolone) .... Apply Two Times A Day To Affected Area 16)  Citalopram Hydrobromide 10 Mg  Tabs (Citalopram Hydrobromide) .... 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 Former Smoker. started at age 9.  less  than 4 ppd. Alcohol use-yes  Risk Factors: Smoking Status: quit (12/25/2009)  Physical Exam  General:  alert and well-developed.   Head:  normocephalic and atraumatic.   Eyes:  pupils equal and pupils round.   Neck:  No deformities, masses, or tenderness noted. Lungs:  Normal respiratory effort, chest expands symmetrically. Lungs are clear to auscultation, no crackles or wheezes. Heart:  no gallop and no rub.    Abdomen:  soft and non-tender.   Msk:  No deformity or scoliosis noted of thoracic or lumbar spine.   Neurologic:  cranial nerves II-XII intact and gait normal.     Impression & Recommendations:  Problem # 1:  ANEMIA-NOS (ICD-285.9) will need contined followup His updated medication list for this problem includes:    Cyanocobalamin 1000 Mcg/ml Soln (Cyanocobalamin) ..... Inject    Iron 28 Mg Tabs (Ferrous sulfate) .[name]..[name] Two times a day  Orders: Vit B12 1000 mcg (J3420) Admin of Therapeutic Inj  intramuscular or subcutaneous (16109)  Hgb: 11.6 (07/17/2009)   Hct: 36.5 (07/17/2009)   Platelets: 151.0 (07/17/2009) RBC: 4.19 (07/17/2009)   RDW: 14.2 (07/17/2009)   WBC: 5.1 (07/17/2009) MCV: 87.2 (07/17/2009)   MCHC: 31.7 (07/17/2009) Ferritin: 36.8 (07/17/2009) Iron: 56 (07/17/2009)   % Sat: 16.0 (07/17/2009) B12: 553 (07/19/2008)   Folate: 9.8 (03/02/2007)   TSH: 0.98 (07/17/2009)  Problem # 2:  DEPRESSIVE DISORDER (ICD-311) discussed continue current medications  His updated medication list for this problem includes:    Citalopram Hydrobromide 10 Mg Tabs (Citalopram hydrobromide) .[name]... Take 1 tab by mouth daily  Problem # 3:  DIABETES MELLITUS, TYPE II (ICD-250.00) needs labs continue current medications  His updated medication list for this problem includes:    Actos 45 Mg Tabs (Pioglitazone hcl) .[name]... Take 1 tablet by mouth once a day    Aspir-81 81 Mg Tbec (Aspirin) .[name]... Take 1 tablet by mouth once a day    Glipizide 10 Mg Tabs (Glipizide) .[name]... Take 1 tablet by mouth once a day    Januvia 100 Mg Tabs (Sitagliptin phosphate) .[name]... Take 1 tablet by mouth once a day    Lisinopril 5 Mg Tabs (Lisinopril) .[name]... Take 1 tablet by mouth once a day    Metformin Hcl 1000 Mg Tabs (Metformin hcl) .[name]... Take 1 tablet by mouth twice a day  Labs Reviewed: Creat: 0.9 (07/19/2008)     Last Eye Exam: normal (06/12/2009) Reviewed HgBA1c results: 6.4 (07/19/2008)  Complete Medication  List: 1)  Actos 45 Mg Tabs (Pioglitazone hcl) .... Take 1 tablet by mouth once a day 2)  Aspir-81 81 Mg Tbec (Aspirin) .... Take 1 tablet by mouth once a day 3)  Cyanocobalamin 1000 Mcg/ml Soln (Cyanocobalamin) .... Inject 4)  Glipizide 10 Mg Tabs (Glipizide) .... Take 1 tablet by mouth once a day 5)  Januvia 100 Mg Tabs (Sitagliptin phosphate) .... Take 1 tablet by mouth once a day 6)  Lipitor 20 Mg Tabs (Atorvastatin calcium) .... Take 1 tablet by mouth once a day 7)  Lisinopril 5 Mg Tabs (Lisinopril) .... Take 1 tablet by mouth once a day 8)  Metformin Hcl 1000 Mg Tabs (Metformin hcl) .... Take 1 tablet by mouth twice a day 9)  Calcium 600/vitamin D 600-200 Mg-unit Tabs (Calcium carbonate-vitamin d) .... Once daily 10)  Iron 28 Mg Tabs (Ferrous sulfate) .... Two times a day 11)  Doxazosin Mesylate 4 Mg Tabs (Doxazosin mesylate) .... Take 1 tablet by mouth once a day 12)  Multivitamins Tabs (Multiple vitamin) .Marland KitchenMarland KitchenMarland Kitchen  Once daily 13)  Vitamin D 1000 Unit Tabs (Cholecalciferol) .... Once daily--hold while on 50000u 14)  D3-50 50000 Unit Caps (Cholecalciferol) .... Take on once weekly x 12 weeks--call for vit d level for 4 weeks after completion 15)  Nystatin-triamcinolone 100000-0.1 Unit/gm-% Crea (Nystatin-triamcinolone) .... Apply two times a day to affected area 16)  Citalopram Hydrobromide 10 Mg Tabs (Citalopram hydrobromide) .... Take 1 tab by mouth daily  Patient Instructions: 1)  6 weeks no office 2)  CBC  3)  Sprue panel 4)  lfts 5)  bmet 6)  vitamin D   Medication Administration  Injection # 1:    Medication: Vit B12 1000 mcg    Diagnosis: ANEMIA-NOS (ICD-285.9)    Route: IM    Site: L deltoid    Exp Date: 08/14/2011    Lot #: [id number]    Mfr: American Regent    Patient tolerated injection without complications    Given by: Kern Reap CMA (AAMA) (February 05, 2010 11:19 AM)  Orders Added: 1)  Vit B12 1000 mcg [J3420] 2)  Admin of Therapeutic Inj  intramuscular or  subcutaneous [96372] 3)  Est. Patient Level IV [45409]

## 2010-08-14 NOTE — Assessment & Plan Note (Signed)
Summary: ?shingles/njr   Vital Signs:  Patient profile:   70 year old male Weight:      232 pounds Temp:     98.2 degrees F oral BP sitting:   110 / 60  (left arm) Cuff size:   regular  Vitals Entered By: Kathrynn Speed CMA (December 27, 2009 12:52 PM) CC: Shingles   CC:  Shingles.  History of Present Illness: 70 year old patient, who presents with a two-day history of a urticarial pruritic rash involving his back region bilaterally.  He was concerned about possible shingles, which he has had in the past.  He has been doing some outdoor work, but always is covered.  He has had some contact dermatitis issues in the remote past.  He does have well controlled type 2 diabetes  Current Medications (verified): 1)  Actos 45 Mg Tabs (Pioglitazone Hcl) .... Take 1 Tablet By Mouth Once A Day 2)  Aspir-81 81 Mg Tbec (Aspirin) .... Take 1 Tablet By Mouth Once A Day 3)  Cyanocobalamin 1000 Mcg/ml Soln (Cyanocobalamin) .... Inject 4)  Glipizide 10 Mg Tabs (Glipizide) .... Take 1 Tablet By Mouth Once A Day 5)  Januvia 100 Mg  Tabs (Sitagliptin Phosphate) .... Take 1 Tablet By Mouth Once A Day 6)  Lipitor 20 Mg Tabs (Atorvastatin Calcium) .... Take 1 Tablet By Mouth Once A Day 7)  Lisinopril 5 Mg Tabs (Lisinopril) .... Take 1 Tablet By Mouth Once A Day 8)  Metformin Hcl 1000 Mg Tabs (Metformin Hcl) .... Take 1 Tablet By Mouth Twice A Day 9)  Calcium 600/vitamin D 600-200 Mg-Unit  Tabs (Calcium Carbonate-Vitamin D) .... Once Daily 10)  Iron 28 Mg  Tabs (Ferrous Sulfate) .... Once Daily 11)  Doxazosin Mesylate 4 Mg Tabs (Doxazosin Mesylate) .... Take 1 Tablet By Mouth Once A Day 12)  Multivitamins  Tabs (Multiple Vitamin) .... Once Daily 13)  Vitamin D 1000 Unit Tabs (Cholecalciferol) .... Once Daily--Hold While On 50000u 14)  D3-50 50000 Unit Caps (Cholecalciferol) .... Take On Once Weekly X 12 Weeks--Call For Vit D Level For 4 Weeks After Completion 15)  Nystatin-Triamcinolone 100000-0.1 Unit/gm-%  Crea (Nystatin-Triamcinolone) .... Apply Two Times A Day To Affected Area 16)  Citalopram Hydrobromide 10 Mg  Tabs (Citalopram Hydrobromide) .... Take 1 Tab By Mouth Daily  Allergies (verified): 1)  Sulfamethoxazole (Sulfamethoxazole) 2)  Penicillin V Potassium (Penicillin V Potassium)  Past History:  Past Medical History: Reviewed history from 09/13/2008 and no changes required. Allergic rhinitis Diabetes mellitus, type II Hypertension Ischemic optic neuropathy L 2001 Anemia-NOS iron deficient and B12 deficient Hyperlipidemia OSA  Physical Exam  General:  overweight-appearing.  no distress.  Normal blood pressure Skin:  scattered erythematous, raised urticarial lesions over the mid back region bilaterally.  These are quite pruritic   Impression & Recommendations:  Problem # 1:  DIABETES MELLITUS, TYPE II (ICD-250.00)  His updated medication list for this problem includes:    Actos 45 Mg Tabs (Pioglitazone hcl) .[name]... Take 1 tablet by mouth once a day    Aspir-81 81 Mg Tbec (Aspirin) .[name]... Take 1 tablet by mouth once a day    Glipizide 10 Mg Tabs (Glipizide) .[name]... Take 1 tablet by mouth once a day    Januvia 100 Mg Tabs (Sitagliptin phosphate) .[name]... Take 1 tablet by mouth once a day    Lisinopril 5 Mg Tabs (Lisinopril) .[name]... Take 1 tablet by mouth once a day    Metformin Hcl 1000 Mg Tabs (Metformin hcl) .[name]... Take 1  tablet by mouth twice a day  Problem # 2:  DERMATITIS (ICD-692.9) diabetes seems to be quite well controlled with M-mode A1c is always less than 7.  He is quite uncomfortable with the pruritus; will treat with 40 of Depo-Medrol  Complete Medication List: 1)  Actos 45 Mg Tabs (Pioglitazone hcl) .... Take 1 tablet by mouth once a day 2)  Aspir-81 81 Mg Tbec (Aspirin) .... Take 1 tablet by mouth once a day 3)  Cyanocobalamin 1000 Mcg/ml Soln (Cyanocobalamin) .... Inject 4)  Glipizide 10 Mg Tabs (Glipizide) .... Take 1 tablet by mouth once a day 5)  Januvia 100 Mg  Tabs (Sitagliptin phosphate) .... Take 1 tablet by mouth once a day 6)  Lipitor 20 Mg Tabs (Atorvastatin calcium) .... Take 1 tablet by mouth once a day 7)  Lisinopril 5 Mg Tabs (Lisinopril) .... Take 1 tablet by mouth once a day 8)  Metformin Hcl 1000 Mg Tabs (Metformin hcl) .... Take 1 tablet by mouth twice a day 9)  Calcium 600/vitamin D 600-200 Mg-unit Tabs (Calcium carbonate-vitamin d) .... Once daily 10)  Iron 28 Mg Tabs (Ferrous sulfate) .... Once daily 11)  Doxazosin Mesylate 4 Mg Tabs (Doxazosin mesylate) .... Take 1 tablet by mouth once a day 12)  Multivitamins Tabs (Multiple vitamin) .... Once daily 13)  Vitamin D 1000 Unit Tabs (Cholecalciferol) .... Once daily--hold while on 50000u 14)  D3-50 50000 Unit Caps (Cholecalciferol) .... Take on once weekly x 12 weeks--call for vit d level for 4 weeks after completion 15)  Nystatin-triamcinolone 100000-0.1 Unit/gm-% Crea (Nystatin-triamcinolone) .... Apply two times a day to affected area 16)  Citalopram Hydrobromide 10 Mg Tabs (Citalopram hydrobromide) .... Take 1 tab by mouth daily  Patient Instructions: 1)  Please schedule a follow-up appointment as needed.

## 2010-08-14 NOTE — Assessment & Plan Note (Signed)
Summary: b12 shot/jls   Nurse Visit    Prior Medications: ACTOS 45 MG TABS (PIOGLITAZONE HCL) Take 1 tablet by mouth once a day ASPIR-81 81 MG TBEC (ASPIRIN) Take 1 tablet by mouth once a day CYANOCOBALAMIN 1000 MCG/ML SOLN (CYANOCOBALAMIN) Inject GLIPIZIDE 10 MG TABS (GLIPIZIDE) Take 1 tablet by mouth once a day JANUVIA 100 MG  TABS (SITAGLIPTIN PHOSPHATE) Take 1 tablet by mouth once a day LIPITOR 20 MG TABS (ATORVASTATIN CALCIUM) Take 1 tablet by mouth once a day LISINOPRIL 5 MG TABS (LISINOPRIL) Take 1 tablet by mouth once a day METFORMIN HCL 1000 MG TABS (METFORMIN HCL) Take 1 tablet by mouth twice a day CALCIUM 600/VITAMIN D 600-200 MG-UNIT  TABS (CALCIUM CARBONATE-VITAMIN D)  IRON 28 MG  TABS (FERROUS SULFATE) once daily NASONEX 50 MCG/ACT SUSP (MOMETASONE FUROATE) 1-2 each nostril qd DOXAZOSIN MESYLATE 2 MG TABS (DOXAZOSIN MESYLATE) 1 by mouth at bedtime or as directed Current Allergies: SULFAMETHOXAZOLE (SULFAMETHOXAZOLE) PENICILLIN V POTASSIUM (PENICILLIN V POTASSIUM)    Medication Administration  Injection # 1:    Medication: Vit B12 1000 mcg    Diagnosis: ANEMIA-NOS (ICD-285.9)    Route: IM    Site: L deltoid    Exp Date: 10/11/2009    Lot #: [id number]    Mfr: [employer]    Patient tolerated injection without complications    Given by: Gladis Riffle, RN (May 23, 2008 1:37 PM)  Orders Added: 1)  Vit B12 1000 mcg [J3420] 2)  Admin of Therapeutic Inj  intramuscular or subcutaneous Lepidus.Putnam    ]

## 2010-08-14 NOTE — Assessment & Plan Note (Signed)
Summary: roa/mhf lmom for pt to come in 2.15p/njr  Medications Added JANUVIA 100 MG  TABS (SITAGLIPTIN PHOSPHATE) Take 1 tablet by mouth once a day IRON 28 MG  TABS (FERROUS SULFATE) once daily NASONEX 50 MCG/ACT SUSP (MOMETASONE FUROATE) 1-2 each nostril qd DOXAZOSIN MESYLATE 2 MG TABS (DOXAZOSIN MESYLATE) 1 by mouth at bedtime or as directed        Vital Signs:  Patient Profile:   70 Years Old Male Weight:      225 pounds Temp:     98.2 degrees F oral Pulse rate:   80 / minute BP sitting:   130 / 80  (left arm)  Vitals Entered By: Gladis Riffle, RN (June 21, 2007 4:33 PM)                 Chief Complaint:  difficulty urination and has tried flomax--A1c 6.8 last week.  History of Present Illness: Lng hx of urinating trouble. urgency hesitancy, frequency and nocturia x 3. Hx of flomax use at least 6 months ago---worked ok but developed low semen production---quit.   DM--followed by Dr. Dagoberto Ligas A1C 6.8 %  HTN---no sxs.   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   Eyes:     pupils equal and pupils round.   Ears:     R ear normal and L ear normal.   Neck:     No deformities, masses, or tenderness noted. Lungs:     Normal respiratory effort, chest expands symmetrically. Lungs are clear to auscultation, no crackles or wheezes. Heart:     Normal rate and regular rhythm. S1 and S2 normal without gallop, murmur, click, rub or other extra sounds. Abdomen:     Bowel sounds positive,abdomen soft and non-tender without masses, organomegaly or hernias noted. Msk:     No deformity or scoliosis noted of thoracic or lumbar spine.   Pulses:     R and L carotid,radial,femoral,dorsalis pedis and posterior tibial pulses are full and equal bilaterally Extremities:  No clubbing, cyanosis, edema, or deformity noted   Neurologic:     No cranial nerve deficits noted. Station and gait are normal. Sensory, motor and coordinative functions appear intact.    Impression & Recommendations:  Problem # 1:  BLADDER NECK OBSTRUCTION (ICD-596.0) discuss treatment options.  He would like to try generic medication.  Will try doxazosin 1 mg p.o. nightly for 7 nights and then 2 mg p.o. nightly.  He will see me in one to two months.  Side effects of doxazosin discussed.  Risks of doxazosin discussed.  Problem # 2:  HYPERTENSION (ICD-401.9) adequate control, no further therapy necessary. His updated medication list for this problem includes:    Lisinopril 5 Mg Tabs (Lisinopril) .[name]... Take 1 tablet by mouth once a day    Doxazosin Mesylate 2 Mg Tabs (Doxazosin mesylate) .[name]... 1 by mouth at bedtime or as directed  BP today: 130/80 Prior BP: 112/52 (06/01/2007)   Problem # 3:  DIABETES MELLITUS, TYPE II (ICD-250.00) followed by endocrinology.  I will not  further evaluate. His updated medication list for this problem includes:    Actos 45 Mg Tabs (Pioglitazone hcl) .[name]... Take 1 tablet by mouth once a day    Aspir-81 81 Mg Tbec (Aspirin) .[name]... Take 1 tablet by mouth once a day    Glipizide 10 Mg Tabs (Glipizide) .[name]... Take 1 tablet by mouth once a day    Januvia 100 Mg Tabs (Sitagliptin phosphate) .[name]... Take 1 tablet by mouth once a day    Lisinopril 5 Mg Tabs (Lisinopril) .[name]... Take 1 tablet by mouth once a day    Metformin Hcl 1000 Mg Tabs (Metformin hcl) .[name]... Take 1 tablet by mouth twice a day   Complete Medication List: 1)  Actos 45 Mg Tabs (Pioglitazone hcl) .... Take 1 tablet by mouth once a day 2)  Aspir-81 81 Mg Tbec (Aspirin) .... Take 1 tablet by mouth once a day 3)  Cyanocobalamin 1000 Mcg/ml Soln (Cyanocobalamin) .... Inject 4)  Glipizide 10 Mg Tabs (Glipizide) .... Take 1 tablet by mouth once a day 5)  Januvia 100 Mg Tabs (Sitagliptin phosphate) .... Take 1 tablet by mouth once a day 6)  Lipitor 20 Mg Tabs (Atorvastatin calcium) .... Take 1 tablet by mouth once a day 7)  Lisinopril 5 Mg Tabs (Lisinopril) .... Take 1 tablet by mouth once a day 8)  Metformin Hcl 1000 Mg Tabs (Metformin hcl) .... Take 1 tablet by mouth twice a day 9)  Calcium 600/vitamin D 600-200 Mg-unit Tabs (Calcium carbonate-vitamin d) 10)  Iron 28 Mg Tabs (Ferrous sulfate) .... Once daily 11)  Nasonex 50 Mcg/act Susp (Mometasone furoate) .[name].. 1-2 each nostril qd 12)  Doxazosin Mesylate 2 Mg Tabs (Doxazosin mesylate) .[name].. 1 by mouth at bedtime or as directed  Other Orders: Vit B12 1000 mcg (J3420) Admin of Therapeutic Inj  intramuscular or subcutaneous ([address])   Patient Instructions: 1)  6 weeks    Prescriptions: DOXAZOSIN MESYLATE 2 MG TABS (DOXAZOSIN MESYLATE) 1 by mouth at bedtime or as directed  #30 x 11   Entered and Authorized by:   Birdie Sons MD   Signed by:   Birdie Sons MD on 06/21/2007   Method used:   Electron[facility] sent to ...        [facility]  [address]  [id number]       [address]       [place], [place]  [address]  Ph: [phone] or [phone]       Fax: [phone]   RxID:   [id number]  ]  Medication Administration  Injection # 1:    Medication: Vit B12 1000 mcg    Diagnosis: ANEMIA-NOS (ICD-285.9)    Route: IM    Site: L deltoid    Exp Date: 10/11/2008    Lot #: [id number]    Mfr: [employer]    Patient tolerated injection without complications    Given by: Gladis Riffle, RN (June 21, 2007 4:41 PM)  Orders Added: 1)  Vit B12 1000 mcg [J3420] 2)  Admin of Therapeutic Inj  intramuscular or subcutaneous [90772] 3)  Est. Patient Level IV [57846]

## 2010-08-14 NOTE — Assessment & Plan Note (Signed)
Summary: b12 inj/njr   Nurse Visit    Prior Medications: ACTOS 45 MG TABS (PIOGLITAZONE HCL) Take 1 tablet by mouth once a day ASPIR-81 81 MG TBEC (ASPIRIN) Take 1 tablet by mouth once a day CYANOCOBALAMIN 1000 MCG/ML SOLN (CYANOCOBALAMIN) Inject GLIPIZIDE 10 MG TABS (GLIPIZIDE) Take 1 tablet by mouth once a day JANUVIA 100 MG  TABS (SITAGLIPTIN PHOSPHATE) Take 1 tablet by mouth once a day LIPITOR 20 MG TABS (ATORVASTATIN CALCIUM) Take 1 tablet by mouth once a day LISINOPRIL 5 MG TABS (LISINOPRIL) Take 1 tablet by mouth once a day METFORMIN HCL 1000 MG TABS (METFORMIN HCL) Take 1 tablet by mouth twice a day CALCIUM 600/VITAMIN D 600-200 MG-UNIT  TABS (CALCIUM CARBONATE-VITAMIN D)  IRON 28 MG  TABS (FERROUS SULFATE) once daily NASONEX 50 MCG/ACT SUSP (MOMETASONE FUROATE) 1-2 each nostril qd DOXAZOSIN MESYLATE 2 MG TABS (DOXAZOSIN MESYLATE) 1 by mouth at bedtime or as directed Current Allergies: SULFAMETHOXAZOLE (SULFAMETHOXAZOLE) PENICILLIN V POTASSIUM (PENICILLIN V POTASSIUM)    Medication Administration  Injection # 1:    Medication: Vit B12 1000 mcg    Diagnosis: ANEMIA-NOS (ICD-285.9)    Route: IM    Site: L deltoid    Exp Date: 10/11/2009    Lot #: [id number]    Mfr: [employer]    Patient tolerated injection without complications    Given by: Gladis Riffle, RN (June 20, 2008 12:53 PM)  Orders Added: 1)  Vit B12 1000 mcg [J3420] 2)  Admin of Therapeutic Inj  intramuscular or subcutaneous Lepidus.Putnam    ]

## 2010-08-14 NOTE — Consult Note (Signed)
Summary: Dr Mina Marble note  Dr Mina Marble note   Imported By: Kassie Mends 01/03/2008 16:02:37  _____________________________________________________________________  External Attachment:    Type:   Image     Comment:   Dr Mina Marble note

## 2010-08-14 NOTE — Assessment & Plan Note (Signed)
Summary: b12 inj/njr PT RSC/NJR   Nurse Visit    Prior Medications: ACTOS 45 MG TABS (PIOGLITAZONE HCL) Take 1 tablet by mouth once a day ASPIR-81 81 MG TBEC (ASPIRIN) Take 1 tablet by mouth once a day CYANOCOBALAMIN 1000 MCG/ML SOLN (CYANOCOBALAMIN) Inject GLIPIZIDE 10 MG TABS (GLIPIZIDE) Take 1 tablet by mouth once a day JANUVIA 100 MG  TABS (SITAGLIPTIN PHOSPHATE) Take 1 tablet by mouth once a day LIPITOR 20 MG TABS (ATORVASTATIN CALCIUM) Take 1 tablet by mouth once a day LISINOPRIL 5 MG TABS (LISINOPRIL) Take 1 tablet by mouth once a day METFORMIN HCL 1000 MG TABS (METFORMIN HCL) Take 1 tablet by mouth twice a day CALCIUM 600/VITAMIN D 600-200 MG-UNIT  TABS (CALCIUM CARBONATE-VITAMIN D) once daily IRON 28 MG  TABS (FERROUS SULFATE) once daily NASONEX 50 MCG/ACT SUSP (MOMETASONE FUROATE) 1-2 each nostril qd DOXAZOSIN MESYLATE 2 MG TABS (DOXAZOSIN MESYLATE) 1 by mouth at bedtime MULTIVITAMINS  TABS (MULTIPLE VITAMIN) once daily Current Allergies: SULFAMETHOXAZOLE (SULFAMETHOXAZOLE) PENICILLIN V POTASSIUM (PENICILLIN V POTASSIUM)    Medication Administration  Injection # 1:    Medication: Vit B12 1000 mcg    Diagnosis: ANEMIA-NOS (ICD-285.9)    Route: IM    Site: R deltoid    Exp Date: 02/10/2010    Lot #: [id number]    Mfr: [employer]    Patient tolerated injection without complications    Given by: Gladis Riffle, RN (August 14, 2008 10:02 AM)  Orders Added: 1)  Vit B12 1000 mcg [J3420] 2)  Admin of Therapeutic Inj  intramuscular or subcutaneous Lepidus.Putnam    ]

## 2010-08-14 NOTE — Procedures (Signed)
Summary: Colonoscopy Report/[facility]  Colonoscopy Report/[facility]   Imported By: Maryln Gottron 07/17/2009 14:04:00  _____________________________________________________________________  External Attachment:    Type:   Image     Comment:   External Document  Appended Document: Colonoscopy Report/[facility] call patient. colonoscopy =2013---stool cards this year at his convenience  Appended Document: Colonoscopy Report/[facility] Patient notified. Will pick up stool cards when has next B12 inj.

## 2010-08-14 NOTE — Progress Notes (Signed)
Summary: email ?  Phone Note Call from Patient   Summary of Call: e mail  Follow-up for Phone Call        would you call pt---he tried to send an email--but all outside emails end up in my spam folder and I can't read them. We'll you see if he has a question? LMTCB Debby [facility] CMA  December 12, 2007 2:19 PM Follow-up by: Birdie Sons MD,  December 12, 2007 1:20 PM

## 2010-08-14 NOTE — Assessment & Plan Note (Signed)
Summary: b12 inj/njr   Nurse Visit    Prior Medications: ACTOS 45 MG TABS (PIOGLITAZONE HCL) Take 1 tablet by mouth once a day ASPIR-81 81 MG TBEC (ASPIRIN) Take 1 tablet by mouth once a day CYANOCOBALAMIN 1000 MCG/ML SOLN (CYANOCOBALAMIN) Inject GLIPIZIDE 10 MG TABS (GLIPIZIDE) Take 1 tablet by mouth once a day JANUVIA 100 MG  TABS (SITAGLIPTIN PHOSPHATE) Take 1 tablet by mouth once a day LIPITOR 20 MG TABS (ATORVASTATIN CALCIUM) Take 1 tablet by mouth once a day LISINOPRIL 5 MG TABS (LISINOPRIL) Take 1 tablet by mouth once a day METFORMIN HCL 1000 MG TABS (METFORMIN HCL) Take 1 tablet by mouth twice a day CALCIUM 600/VITAMIN D 600-200 MG-UNIT  TABS (CALCIUM CARBONATE-VITAMIN D)  IRON 28 MG  TABS (FERROUS SULFATE) once daily NASONEX 50 MCG/ACT SUSP (MOMETASONE FUROATE) 1-2 each nostril qd DOXAZOSIN MESYLATE 2 MG TABS (DOXAZOSIN MESYLATE) 1 by mouth at bedtime or as directed Current Allergies: SULFAMETHOXAZOLE (SULFAMETHOXAZOLE) PENICILLIN V POTASSIUM (PENICILLIN V POTASSIUM)    Medication Administration  Injection # 1:    Medication: Vit B12 1000 mcg    Diagnosis: ANEMIA-NOS (ICD-285.9)    Route: IM    Site: L deltoid    Exp Date: 05/13/2009    Lot #: [id number]    Mfr: [employer]    Patient tolerated injection without complications    Given by: Gladis Riffle, RN (April 05, 2008 1:57 PM)  Orders Added: 1)  Vit B12 1000 mcg [J3420] 2)  Admin of Therapeutic Inj  intramuscular or subcutaneous Lepidus.Putnam    ]

## 2010-08-14 NOTE — Assessment & Plan Note (Signed)
Summary: Pt has HX of pneumonia, has had a cold/cough/nn  Medications Added ZITHROMAX Z-PAK 250 MG  TABS (AZITHROMYCIN) as directed        Vital Signs:  Patient Profile:   70 Years Old Male Weight:      231 pounds Temp:     98.7 degrees F oral Pulse rate:   85 / minute Pulse rhythm:   regular BP sitting:   112 / 52  (left arm) Cuff size:   large  Vitals Entered By: Alfred Levins, CMA (June 01, 2007 4:17 PM)                 Chief Complaint:  runny nose, x5d, and coughing up green mucus.  History of Present Illness: 5 days of stuffy head, sinus pressure, BLowing green mucus from nose, and cough up green sputum. No fever. On Mucinex.  Current Allergies: SULFAMETHOXAZOLE (SULFAMETHOXAZOLE) PENICILLIN V POTASSIUM (PENICILLIN V POTASSIUM)     Review of Systems      See HPI   Physical Exam  General:     Well-developed,well-nourished,in no acute distress; alert,appropriate and cooperative throughout examination Head:     Normocephalic and atraumatic without obvious abnormalities. No apparent alopecia or balding. Eyes:     No corneal or conjunctival inflammation noted. EOMI. Perrla. Funduscopic exam benign, without hemorrhages, exudates or papilledema. Vision grossly normal. Ears:     External ear exam shows no significant lesions or deformities.  Otoscopic examination reveals clear canals, tympanic membranes are intact bilaterally without bulging, retraction, inflammation or discharge. Hearing is grossly normal bilaterally. Nose:     External nasal examination shows no deformity or inflammation. Nasal mucosa are pink and moist without lesions or exudates. Mouth:     Oral mucosa and oropharynx without lesions or exudates.  Teeth in good repair. Neck:     No deformities, masses, or tenderness noted. Lungs:     Normal respiratory effort, chest expands symmetrically. Lungs are clear to auscultation, no crackles or wheezes.    Impression & Recommendations:   Problem # 1:  OTHER ACUTE SINUSITIS (ICD-461.8)  His updated medication list for this problem includes:    Zithromax Z-pak 250 Mg Tabs (Azithromycin) .[name]... As directed   Complete Medication List: 1)  Actos 45 Mg Tabs (Pioglitazone hcl) .... Take 1 tablet by mouth once a day 2)  Aspir-81 81 Mg Tbec (Aspirin) .... Take 1 tablet by mouth once a day 3)  Cyanocobalamin 1000 Mcg/ml Soln (Cyanocobalamin) .... Inject 4)  Glipizide 10 Mg Tabs (Glipizide) .... Take 1 tablet by mouth once a day 5)  Januvia 25 Mg Tabs (Sitagliptin phosphate) .... Take once a day 6)  Lipitor 20 Mg Tabs (Atorvastatin calcium) .... Take 1 tablet by mouth once a day 7)  Lisinopril 5 Mg Tabs (Lisinopril) .... Take 1 tablet by mouth once a day 8)  Metformin Hcl 1000 Mg Tabs (Metformin hcl) .... Take 1 tablet by mouth twice a day 9)  Calcium 600/vitamin D 600-200 Mg-unit Tabs (Calcium carbonate-vitamin d) 10)  Zithromax Z-pak 250 Mg Tabs (Azithromycin) .... As directed   Patient Instructions: 1)  Please schedule a follow-up appointment as needed.    Prescriptions: ZITHROMAX Z-PAK 250 MG  TABS (AZITHROMYCIN) as directed  #1 x 0   Entered and Authorized by:   Nelwyn Salisbury MD   Signed by:   Nelwyn Salisbury MD on 06/01/2007   Method used:   Electronically sent to ...       [facility]  [address]  [id number]       [address]       [place], [place]  [address]       Ph: [phone] or [phone]       Fax: [phone]   RxID:   [id number]  ]

## 2010-08-14 NOTE — Miscellaneous (Signed)
Summary: TD given   Clinical Lists Changes  Observations: Added new observation of TD BOOSTER: Td (01/16/2010 15:34)      Immunization History:  Tetanus/Td Immunization History:    Tetanus/Td:  td (01/16/2010) given at [facility]

## 2010-08-14 NOTE — Assessment & Plan Note (Signed)
Summary: B-12INJ/RCD appt 130pm           Flu Vaccine Consent Questions     Do you have a history of severe allergic reactions to this vaccine? no    Any prior history of allergic reactions to egg and/or gelatin? no    Do you have a sensitivity to the preservative Thimersol? no    Do you have a past history of Guillan-Barre Syndrome? no    Do you currently have an acute febrile illness? no    Have you ever had a severe reaction to latex? no    Vaccine information given and explained to patient? yes    Are you currently pregnant? no    Lot Number:[id number]   Exp Date:01/10/2011   Site Given  Left Deltoid IM  Nurse Visit   Allergies: 1)  Sulfamethoxazole (Sulfamethoxazole) 2)  Penicillin V Potassium (Penicillin V Potassium)  Medication Administration  Injection # 1:    Medication: Vit B12 1000 mcg    Diagnosis: ANEMIA-NOS (ICD-285.9)    Route: IM    Site: L deltoid    Exp Date: 10/12/2011    Lot #: [id number]    Mfr: [employer]    Patient tolerated injection without complications    Given by: Sid Falcon LPN (April 16, 2010 1:28 PM)  Orders Added: 1)  Flu Vaccine 84yrs + MEDICARE PATIENTS [Q2039] 2)  Administration Flu vaccine - MCR [G0008] 3)  Vit B12 1000 mcg [J3420] 4)  Admin of Therapeutic Inj  intramuscular or subcutaneous [44034]

## 2010-08-14 NOTE — Letter (Signed)
Summary: [facility]    Imported By: Maryln Gottron 02/26/2010 12:34:28  _____________________________________________________________________  External Attachment:    Type:   Image     Comment:   External Document

## 2010-08-14 NOTE — Assessment & Plan Note (Signed)
Summary: b12 inj/njr PER MARGE DEB WILL GIVE INJ/NJR  Nurse Visit     Allergies: 1)  Sulfamethoxazole (Sulfamethoxazole) 2)  Penicillin V Potassium (Penicillin V Potassium)     Medication Administration  Injection # 1:    Medication: Vit B12 1000 mcg    Diagnosis: ANEMIA-NOS (ICD-285.9)    Route: IM    Site: R deltoid    Exp Date: 09/10/2010    Lot #: [id number]    Mfr: American Regent    Given by: Darra Lis RMA (January 16, 2009 2:48 PM)  Orders Added: 1)  Admin of Therapeutic Inj  intramuscular or subcutaneous [96372] 2)  Vit B12 1000 mcg [J3420]      Medication Administration  Injection # 1:    Medication: Vit B12 1000 mcg    Diagnosis: ANEMIA-NOS (ICD-285.9)    Route: IM    Site: R deltoid    Exp Date: 09/10/2010    Lot #: [id number]    Mfr: American Regent    Given by: Darra Lis RMA (January 16, 2009 2:48 PM)  Orders Added: 1)  Admin of Therapeutic Inj  intramuscular or subcutaneous [96372] 2)  Vit B12 1000 mcg [J3420]

## 2010-08-14 NOTE — Assessment & Plan Note (Signed)
Summary: B12 INJ/NJR/RSC/CJR   Nurse Visit     Allergies: 1)  Sulfamethoxazole (Sulfamethoxazole) 2)  Penicillin V Potassium (Penicillin V Potassium)     Medication Administration  Injection # 1:    Medication: Vit B12 1000 mcg    Diagnosis: ANEMIA-NOS (ICD-285.9)    Route: IM    Site: L deltoid    Exp Date: 08/13/2010    Lot #: [id number]    Mfr: American Regent    Patient tolerated injection without complications    Given by: Gladis Riffle, RN (December 19, 2008 1:40 PM)  Orders Added: 1)  Vit B12 1000 mcg [J3420] 2)  Admin of Therapeutic Inj  intramuscular or subcutaneous [96372]      Medication Administration  Injection # 1:    Medication: Vit B12 1000 mcg    Diagnosis: ANEMIA-NOS (ICD-285.9)    Route: IM    Site: L deltoid    Exp Date: 08/13/2010    Lot #: [id number]    Mfr: American Regent    Patient tolerated injection without complications    Given by: Gladis Riffle, RN (December 19, 2008 1:40 PM)  Orders Added: 1)  Vit B12 1000 mcg [J3420] 2)  Admin of Therapeutic Inj  intramuscular or subcutaneous [[id number]]

## 2010-08-14 NOTE — Assessment & Plan Note (Signed)
Summary: B-12/cjr/pt rescd//ccm/pt rsc/cjr   Nurse Visit   Allergies: 1)  Sulfamethoxazole (Sulfamethoxazole) 2)  Penicillin V Potassium (Penicillin V Potassium)  Medication Administration  Injection # 1:    Medication: Vit B12 1000 mcg    Diagnosis: ANEMIA-NOS (ICD-285.9)    Route: IM    Site: R deltoid    Exp Date: 10/12/2010    Lot #: [id number]    Mfr: [employer] Regent    Patient tolerated injection without complications    Given by: Gladis Riffle, RN (March 27, 2009 1:45 PM)  Orders Added: 1)  Vit B12 1000 mcg [J3420] 2)  Admin of Therapeutic Inj  intramuscular or subcutaneous [96372]   Medication Administration  Injection # 1:    Medication: Vit B12 1000 mcg    Diagnosis: ANEMIA-NOS (ICD-285.9)    Route: IM    Site: R deltoid    Exp Date: 10/12/2010    Lot #: [id number]    Mfr: American Regent    Patient tolerated injection without complications    Given by: Gladis Riffle, RN (March 27, 2009 1:45 PM)  Orders Added: 1)  Vit B12 1000 mcg [J3420] 2)  Admin of Therapeutic Inj  intramuscular or subcutaneous [[id number]]

## 2010-08-14 NOTE — Progress Notes (Signed)
Summary: wants 90 day rx   Phone Note Call from Patient Call back at Home Phone [phone]   Caller: pt vm triage Call For: Trevontae Lindahl Summary of Call: doxazosinmesylate 2 mg tab wrote rx for 30 days and pt would like the rx for 90 days.  He wants this called in to [facility].   Initial call taken by: Roselle Locus,  October 28, 2007 3:23 PM      Prescriptions: DOXAZOSIN MESYLATE 2 MG TABS (DOXAZOSIN MESYLATE) 1 by mouth at bedtime or as directed  #90 x 3   Entered by:   [name] CMA   Authorized by:   Birdie Sons MD   Signed by:   [name] CMA on 10/28/2007   Method used:   Electronically sent to ...       [id number]       [address]       [place], [place]  [address]       Ph: [phone] or [phone]       Fax: [phone]   RxID:   [id number]

## 2010-08-14 NOTE — Assessment & Plan Note (Signed)
Summary: fu pt will come in fasting/njr PT [facility] BMP/NJR   Vital Signs:  Patient Profile:   70 Years Old Male Weight:      230 pounds Temp:     98.5 degrees F Pulse rate:   84 / minute BP sitting:   116 / 60  (left arm)  Vitals Entered By: Gladis Riffle, RN (July 19, 2008 9:36 AM)                 Chief Complaint:  FU and labs already done this AM--does not do CBGs at home.    Updated Prior Medication List: ACTOS 45 MG TABS (PIOGLITAZONE HCL) Take 1 tablet by mouth once a day ASPIR-81 81 MG TBEC (ASPIRIN) Take 1 tablet by mouth once a day CYANOCOBALAMIN 1000 MCG/ML SOLN (CYANOCOBALAMIN) Inject GLIPIZIDE 10 MG TABS (GLIPIZIDE) Take 1 tablet by mouth once a day JANUVIA 100 MG  TABS (SITAGLIPTIN PHOSPHATE) Take 1 tablet by mouth once a day LIPITOR 20 MG TABS (ATORVASTATIN CALCIUM) Take 1 tablet by mouth once a day LISINOPRIL 5 MG TABS (LISINOPRIL) Take 1 tablet by mouth once a day METFORMIN HCL 1000 MG TABS (METFORMIN HCL) Take 1 tablet by mouth twice a day CALCIUM 600/VITAMIN D 600-200 MG-UNIT  TABS (CALCIUM CARBONATE-VITAMIN D) once daily IRON 28 MG  TABS (FERROUS SULFATE) once daily NASONEX 50 MCG/ACT SUSP (MOMETASONE FUROATE) 1-2 each nostril qd DOXAZOSIN MESYLATE 2 MG TABS (DOXAZOSIN MESYLATE) 1 by mouth at bedtime MULTIVITAMINS  TABS (MULTIPLE VITAMIN) once daily  Current Allergies (reviewed today): SULFAMETHOXAZOLE (SULFAMETHOXAZOLE) PENICILLIN V POTASSIUM (PENICILLIN V POTASSIUM)        Complete Medication List: 1)  Actos 45 Mg Tabs (Pioglitazone hcl) .... Take 1 tablet by mouth once a day 2)  Aspir-81 81 Mg Tbec (Aspirin) .... Take 1 tablet by mouth once a day 3)  Cyanocobalamin 1000 Mcg/ml Soln (Cyanocobalamin) .... Inject 4)  Glipizide 10 Mg Tabs (Glipizide) .... Take 1 tablet by mouth once a day 5)  Januvia 100 Mg Tabs (Sitagliptin phosphate) .... Take 1 tablet by mouth once a day 6)  Lipitor 20 Mg Tabs (Atorvastatin calcium) .... Take 1 tablet by mouth once  a day 7)  Lisinopril 5 Mg Tabs (Lisinopril) .... Take 1 tablet by mouth once a day 8)  Metformin Hcl 1000 Mg Tabs (Metformin hcl) .... Take 1 tablet by mouth twice a day 9)  Calcium 600/vitamin D 600-200 Mg-unit Tabs (Calcium carbonate-vitamin d) .... Once daily 10)  Iron 28 Mg Tabs (Ferrous sulfate) .... Once daily 11)  Nasonex 50 Mcg/act Susp (Mometasone furoate) .[name].. 1-2 each nostril qd 12)  Doxazosin Mesylate 2 Mg Tabs (Doxazosin mesylate) .[name].. 1 by mouth at bedtime 13)  Multivitamins Tabs (Multiple vitamin) .... Once daily    ]  Appended Document: fu pt will come in fasting/njr PT [facility] BMP/NJR  History of Present Illness:  Follow-Up Visit      This is a 70 year old man who presents for Follow-up visit.  The patient denies chest pain, palpitations, dizziness, syncope, low blood sugar symptoms, high blood sugar symptoms, edema, SOB, DOE, PND, and orthopnea.  Since the last visit the patient notes no new problems or concerns.  The patient reports taking meds as prescribed, not monitoring BP, and not monitoring blood sugars.  When questioned about possible medication side effects, the patient notes none.    Family History: mother sudden death-67yo father stroke brothe CAD Social History: Occupation: transfers cars--auto auctions Married Former Smoker Alcohol use-yes  no other complaints in a complete ROS     Past Medical History:    Reviewed history from 07/27/2007 and no changes required:       Allergic rhinitis       Diabetes mellitus, type II       Hypertension       Ischemic optic neuropathy L 2001       Anemia-NOS iron deficient and B12 deficient       Hyperlipidemia  Past Surgical History:    Reviewed history from 12/23/2006 and no changes required:       Appendectomy       Rotator cuff repair       Sinus surgery--deviated septum  1959       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 Patient Level IV [16109]   Review of Systems       no other complaints in a complete ROS    Physical Exam  General:     Well-developed,well-nourished,in no acute distress; alert,appropriate and cooperative throughout examination Head:     normocephalic and atraumatic.   Eyes:     pupils equal and pupils round.   Ears:     R ear normal and L ear normal.   Nose:     no external deformity and no external erythema.   Neck:     No deformities, masses, or tenderness noted. Chest Wall:     No deformities, masses, tenderness or gynecomastia noted. Lungs:     Normal respiratory effort, chest expands symmetrically. Lungs are clear to auscultation, no crackles or wheezes. Heart:     Normal rate and regular rhythm. S1 and S2 normal without gallop, murmur, click, rub or other extra sounds. Abdomen:     overweight, active bowel sounds, soft, nontender. Msk:     No deformity or scoliosis noted of thoracic or lumbar spine.   Pulses:     R radial normal and L radial normal.   Extremities:     No clubbing, cyanosis, edema, or deformity noted  Skin:     Intact without suspicious lesions or rashes Cervical Nodes:     no anterior cervical adenopathy and no posterior cervical adenopathy.   Psych:     normally interactive and good eye contact.     Impression & Recommendations:  Problem # 1:  HYPERLIPIDEMIA (ICD-272.4) continue meds pt will get records from dr gegick His updated medication list for this problem includes:    Lipitor 20 Mg Tabs (Atorvastatin calcium) .[name]... Take  1 tablet by mouth once a day   Problem # 2:  HYPERTENSION (ICD-401.9)  His updated medication list for this problem includes:    Lisinopril 5 Mg Tabs (Lisinopril) .[name]... Take 1 tablet by mouth once a day    Doxazosin Mesylate 2 Mg Tabs (Doxazosin mesylate) .[name]... 1 by mouth at bedtime  Prior BP: 116/60 (07/19/2008)   Problem # 3:  DIABETES MELLITUS, TYPE II (ICD-250.00) followed by dr gegick His updated medication list for this problem includes:    Actos 45 Mg Tabs (Pioglitazone hcl) .[name]... Take 1 tablet by mouth once a day    Aspir-81 81 Mg Tbec (Aspirin) .[name]... Take 1 tablet by mouth once a day    Glipizide 10 Mg Tabs (Glipizide) .[name]... Take 1 tablet by mouth once a day    Januvia 100 Mg Tabs (Sitagliptin phosphate) .[name]... Take 1 tablet by mouth once a day    Lisinopril 5 Mg Tabs (Lisinopril) .[name]... Take 1 tablet by mouth once a day    Metformin Hcl 1000 Mg Tabs (Metformin hcl) .[name]... Take 1 tablet by mouth twice a day  has had labs  Complete Medication List: 1)  Actos 45 Mg Tabs (Pioglitazone hcl) .... Take 1 tablet by mouth once a day 2)  Aspir-81 81 Mg Tbec (Aspirin) .... Take 1 tablet by mouth once a day 3)  Cyanocobalamin 1000 Mcg/ml Soln (Cyanocobalamin) .... Inject 4)  Glipizide 10 Mg Tabs (Glipizide) .... Take 1 tablet by mouth once a day 5)  Januvia 100 Mg Tabs (Sitagliptin phosphate) .... Take 1 tablet by mouth once a day 6)  Lipitor 20 Mg Tabs (Atorvastatin calcium) .... Take 1 tablet by mouth once a day 7)  Lisinopril 5 Mg Tabs (Lisinopril) .... Take 1 tablet by mouth once a day 8)  Metformin Hcl 1000 Mg Tabs (Metformin hcl) .... Take 1 tablet by mouth twice a day 9)  Calcium 600/vitamin D 600-200 Mg-unit Tabs (Calcium carbonate-vitamin d) .... Once daily 10)  Iron 28 Mg Tabs (Ferrous sulfate) .... Once daily 11)  Nasonex 50 Mcg/act Susp (Mometasone furoate) .[name].. 1-2 each nostril qd 12)  Doxazosin Mesylate 2 Mg Tabs (Doxazosin mesylate) .[name].. 1 by mouth at bedtime 13)   Multivitamins Tabs (Multiple vitamin) .... Once daily  Other Orders: Vit B12 1000 mcg (J3420) Admin of Therapeutic Inj  intramuscular or subcutaneous (16109) TLB-CBC Platelet - w/Differential (85025-CBCD)   Family History:    Reviewed history from 03/02/2007 and no changes required:       mother sudden death-67yo       father stroke       brothe CAD  Social History:    Reviewed history from 03/02/2007 and no changes required:       Occupation: transfers cars--auto auctions       Married       Former Smoker       Alcohol use-yes

## 2010-08-14 NOTE — Assessment & Plan Note (Signed)
Summary: b12 inj/njr/pt rescd//ccm---PT RSC (BMP) // RS   Nurse Visit   Allergies: 1)  Sulfamethoxazole (Sulfamethoxazole) 2)  Penicillin V Potassium (Penicillin V Potassium)  Medication Administration  Injection # 1:    Medication: Vit B12 1000 mcg    Diagnosis: ANEMIA-NOS (ICD-285.9)    Route: SQ    Site: L deltoid    Exp Date: 03/16/2011    Lot #: [id number]    Mfr: American Regent    Patient tolerated injection without complications    Given by: [name] CMA (October 24, 2009 1:11 PM)  Orders Added: 1)  Vit B12 1000 mcg [J3420] 2)  Admin of Therapeutic Inj  intramuscular or subcutaneous [[id number]]

## 2010-08-14 NOTE — Progress Notes (Signed)
Summary: would like to speak to [facility]   Phone Note Call from Patient Call back at [phone]   Caller: pt vm triage Call For: Swords Summary of Call: wants to speak with "Yolanda Initial call taken by: Roselle Locus,  December 15, 2007 11:49 AM  Follow-up for Phone Call        Spoke with Patient requesting  an appt with Dr. Mina Marble Follow-up by: Florentina Addison,  December 15, 2007 11:51 AM  Additional Follow-up for Phone Call Additional follow up Details #1::        06/09 @ 2:30 with Dr. Mina Marble [address] [phone] Patient aware Additional Follow-up by: Florentina Addison,  December 15, 2007 12:18 PM

## 2010-08-14 NOTE — Assessment & Plan Note (Signed)
Summary: B-12 //VN  Nurse Visit    Prior Medications: ACTOS 45 MG TABS (PIOGLITAZONE HCL) Take 1 tablet by mouth once a day ASPIR-81 81 MG TBEC (ASPIRIN) Take 1 tablet by mouth once a day CYANOCOBALAMIN 1000 MCG/ML SOLN (CYANOCOBALAMIN) Inject GLIPIZIDE 10 MG TABS (GLIPIZIDE) Take 1 tablet by mouth once a day JANUVIA 25 MG TABS (SITAGLIPTIN PHOSPHATE) Take once a day LIPITOR 20 MG TABS (ATORVASTATIN CALCIUM) Take 1 tablet by mouth once a day LISINOPRIL 5 MG TABS (LISINOPRIL) Take 1 tablet by mouth once a day METFORMIN HCL 1000 MG TABS (METFORMIN HCL) Take 1 tablet by mouth twice a day CALCIUM 600/VITAMIN D 600-200 MG-UNIT  TABS (CALCIUM CARBONATE-VITAMIN D)  Current Allergies: SULFAMETHOXAZOLE (SULFAMETHOXAZOLE) PENICILLIN V POTASSIUM (PENICILLIN V POTASSIUM)    Medication Administration  Injection # 1:    Medication: Vit B12 1000 mcg    Diagnosis: ANEMIA-NOS (ICD-285.9)    Route: IM    Site: L deltoid    Exp Date: 10/11/2008    Lot #: [id number]    Mfr: american regent    Patient tolerated injection without complications    Given by: Gladis Riffle, RN (May 10, 2007 4:36 PM)  Orders Added: 1)  Vit B12 1000 mcg [J3420] 2)  Admin of Therapeutic Inj  intramuscular or subcutaneous [email or web address]    ]

## 2010-08-14 NOTE — Progress Notes (Signed)
Summary: dose change request  Phone Note Call from Patient Call back at Home Phone [phone]   Caller: Patient Call For: Birdie Sons MD Summary of Call: Requests change of doxazosin to 4 mg once daily--has been using Rx of friend and was 4mg  and states it works better. Initial call taken by: Gladis Riffle, RN,  December 19, 2008 1:43 PM  Follow-up for Phone Call        ok Follow-up by: Birdie Sons MD,  December 19, 2008 9:09 PM  Additional Follow-up for Phone Call Additional follow up Details #1::        Dose changed on med list .Patient notified by msg on home phone. Additional Follow-up by: Gladis Riffle, RN,  December 20, 2008 8:02 AM    New/Updated Medications: DOXAZOSIN MESYLATE 4 MG TABS (DOXAZOSIN MESYLATE) Take 1 tablet by mouth once a day

## 2010-08-14 NOTE — Assessment & Plan Note (Signed)
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 HYPERTENSION (ICD-401.9)-he is tolerating his medications without difficulty. DIABETES MELLITUS, TYPE II (ICD-250.00)-he obtains yearly eye exam, checks his feet daily.  Laboratory values and medications are tested by endocrinologist.  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 Hyperlipidemia    Social History: Occupation: transfers cars--auto auctions Married Former Smoker Alcohol use-yes   Family History: mother sudden death-67yo father stroke brothe CAD    Current Meds:  ACTOS 45 MG TABS (PIOGLITAZONE HCL) Take 1 tablet by mouth once a day ASPIR-81 81 MG TBEC (ASPIRIN) Take 1 tablet by mouth once a day CYANOCOBALAMIN 1000 MCG/ML SOLN (CYANOCOBALAMIN) Inject GLIPIZIDE 10 MG TABS (GLIPIZIDE) Take 1 tablet by mouth once a day JANUVIA 100 MG  TABS (SITAGLIPTIN PHOSPHATE) Take 1 tablet by mouth once a day LIPITOR 20 MG TABS (ATORVASTATIN CALCIUM) Take 1 tablet by mouth once a day LISINOPRIL 5 MG TABS (LISINOPRIL) Take 1 tablet by mouth once a day METFORMIN HCL 1000 MG TABS (METFORMIN HCL) Take 1 tablet by mouth twice a day CALCIUM 600/VITAMIN D 600-200 MG-UNIT  TABS (CALCIUM CARBONATE-VITAMIN D)  IRON 28 MG  TABS (FERROUS SULFATE) once daily NASONEX 50 MCG/ACT SUSP (MOMETASONE FUROATE) 1-2  each nostril qd DOXAZOSIN MESYLATE 2 MG TABS (DOXAZOSIN MESYLATE) 1 by mouth at bedtime or as directed       Current Allergies: SULFAMETHOXAZOLE (SULFAMETHOXAZOLE) PENICILLIN V POTASSIUM (PENICILLIN V POTASSIUM)     Review of Systems       no other complaints in a complete ROS    Physical Exam  General:     Well-developed,well-nourished,in no acute distress; alert,appropriate and cooperative throughout examination Head:     normocephalic and atraumatic.   Eyes:     pupils equal and pupils round.   Ears:     R ear normal and L ear normal.   Neck:     No deformities, masses, or tenderness noted. Chest Wall:     No deformities, masses, tenderness or gynecomastia noted. Lungs:     Normal respiratory effort, chest expands symmetrically. Lungs are clear to auscultation, no crackles or wheezes. Heart:     Normal rate and regular rhythm. S1 and S2 normal without gallop, murmur, click, rub or other extra sounds. Abdomen:     overweight, active bowel sounds, soft, nontender. Msk:     No deformity or scoliosis noted of thoracic or lumbar spine.   Pulses:     R radial normal and L radial normal.   Extremities:     No clubbing, cyanosis, edema, or deformity noted  Neurologic:     cranial nerves II-XII intact and gait normal.   Skin:     Intact without suspicious lesions or rashes Cervical Nodes:     no anterior cervical adenopathy and no  posterior cervical adenopathy.   Psych:     normally interactive and good eye contact.      Impression & Recommendations:  Problem # 1:  HYPERLIPIDEMIA (ICD-272.4) laboratory work performed with endocrinology. His updated medication list for this problem includes:    Lipitor 20 Mg Tabs (Atorvastatin calcium) .Frank [name]... Take 1 tablet by mouth once a day   Problem # 2:  DIABETES MELLITUS, TYPE II (ICD-250.00) laboratory work performed with endocrinology. His updated medication list for this problem includes:    Actos 45 Mg Tabs  (Pioglitazone hcl) .Frank [name]... Take 1 tablet by mouth once a day    Aspir-81 81 Mg Tbec (Aspirin) .Frank [name]... Take 1 tablet by mouth once a day    Glipizide 10 Mg Tabs (Glipizide) .Frank [name]... Take 1 tablet by mouth once a day    Januvia 100 Mg Tabs (Sitagliptin phosphate) .Frank [name]... Take 1 tablet by mouth once a day    Lisinopril 5 Mg Tabs (Lisinopril) .Frank [name]... Take 1 tablet by mouth once a day    Metformin Hcl 1000 Mg Tabs (Metformin hcl) .Frank [name]... Take 1 tablet by mouth twice a day   Problem # 3:  HYPERTENSION (ICD-401.9) adequate control.  Continue current medications. His updated medication list for this problem includes:    Lisinopril 5 Mg Tabs (Lisinopril) .Frank [name]... Take 1 tablet by mouth once a day    Doxazosin Mesylate 2 Mg Tabs (Doxazosin mesylate) .Frank [name]... 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  Continue doxazosin.  Problem # 6:  ANEMIA-NOS (ICD-285.9)  His updated medication list for this problem includes:    Cyanocobalamin 1000 Mcg/ml Soln (Cyanocobalamin) ..... Inject    Iron 28 Mg Tabs (Ferrous sulfate) ..... Once daily  Orders: Vit B12 1000 mcg (J3420) Admin of Therapeutic Inj  intramuscular or subcutaneous (16109)   Complete Medication List: 1)  Actos 45 Mg Tabs (Pioglitazone hcl) .... Take 1 tablet by mouth once a day 2)  Aspir-81 81 Mg Tbec (Aspirin) .... Take 1 tablet by mouth once a day 3)  Cyanocobalamin 1000 Mcg/ml Soln (Cyanocobalamin) .... Inject 4)  Glipizide 10 Mg Tabs (Glipizide) .... Take 1 tablet by mouth once a day 5)  Januvia 100 Mg Tabs (Sitagliptin phosphate) .... Take 1 tablet by mouth once a day 6)  Lipitor 20 Mg Tabs (Atorvastatin calcium) .... Take 1 tablet by mouth once a day 7)  Lisinopril 5 Mg Tabs (Lisinopril)  .... Take 1 tablet by mouth once a day 8)  Metformin Hcl 1000 Mg Tabs (Metformin hcl) .... Take 1 tablet by mouth twice a day 9)  Calcium 600/vitamin D 600-200 Mg-unit Tabs (Calcium carbonate-vitamin d) 10)  Iron 28 Mg Tabs (Ferrous sulfate) .... Once daily 11)  Nasonex 50 Mcg/act Susp (Mometasone furoate) .Frank [name].. 1-2 each nostril qd 12)  Doxazosin Mesylate 2 Mg Tabs (Doxazosin mesylate) .Frank [name].. 1 by mouth at bedtime or as directed     ]  Medication Administration  Injection # 1:    Medication: Vit B12 1000 mcg    Diagnosis: ANEMIA-NOS (ICD-285.9)    Route: IM    Site: L deltoid    Exp Date: 10/2008    Lot #: [id number]    Mfr: [employer] Regent    Given by: Raechel Ache, RN (August 24, 2007 3:43 PM)  Orders Added: 1)  Vit B12 1000 mcg [J3420] 2)  Admin of Therapeutic Inj  intramuscular or subcutaneous [96372] 3)  Est. Patient Level IV [47425]

## 2010-08-14 NOTE — Assessment & Plan Note (Signed)
Summary: B12 INJ (PT WILL GO TO LAB FOR VIT D CK) // RS/pt rescd for i...   Nurse Visit   Allergies: 1)  Sulfamethoxazole (Sulfamethoxazole) 2)  Penicillin V Potassium (Penicillin V Potassium)  Medication Administration  Injection # 1:    Medication: Vit B12 1000 mcg    Diagnosis: ANEMIA-NOS (ICD-285.9)    Route: IM    Site: L deltoid    Exp Date: 03/14/2011    Lot #: [id number]    Mfr: [employer] Regent    Patient tolerated injection without complications    Given by: Gladis Riffle, RN (Dec 04, 2009 1:42 PM)  Orders Added: 1)  Vit B12 1000 mcg [J3420] 2)  Admin of Therapeutic Inj  intramuscular or subcutaneous [96372]   Medication Administration  Injection # 1:    Medication: Vit B12 1000 mcg    Diagnosis: ANEMIA-NOS (ICD-285.9)    Route: IM    Site: L deltoid    Exp Date: 03/14/2011    Lot #: [id number]    Mfr: [employer] Regent    Patient tolerated injection without complications    Given by: Gladis Riffle, RN (Dec 04, 2009 1:42 PM)  Orders Added: 1)  Vit B12 1000 mcg [J3420] 2)  Admin of Therapeutic Inj  intramuscular or subcutaneous [54098]

## 2010-08-14 NOTE — Assessment & Plan Note (Signed)
Summary: B-12 SHOT- [name]/NTA   Nurse Visit    Prior Medications: ACTOS 45 MG TABS (PIOGLITAZONE HCL) Take 1 tablet by mouth once a day ASPIR-81 81 MG TBEC (ASPIRIN) Take 1 tablet by mouth once a day CYANOCOBALAMIN 1000 MCG/ML SOLN (CYANOCOBALAMIN) Inject GLIPIZIDE 10 MG TABS (GLIPIZIDE) Take 1 tablet by mouth once a day JANUVIA 100 MG  TABS (SITAGLIPTIN PHOSPHATE) Take 1 tablet by mouth once a day LIPITOR 20 MG TABS (ATORVASTATIN CALCIUM) Take 1 tablet by mouth once a day LISINOPRIL 5 MG TABS (LISINOPRIL) Take 1 tablet by mouth once a day METFORMIN HCL 1000 MG TABS (METFORMIN HCL) Take 1 tablet by mouth twice a day CALCIUM 600/VITAMIN D 600-200 MG-UNIT  TABS (CALCIUM CARBONATE-VITAMIN D)  IRON 28 MG  TABS (FERROUS SULFATE) once daily NASONEX 50 MCG/ACT SUSP (MOMETASONE FUROATE) 1-2 each nostril qd DOXAZOSIN MESYLATE 2 MG TABS (DOXAZOSIN MESYLATE) 1 by mouth at bedtime or as directed Current Allergies: SULFAMETHOXAZOLE (SULFAMETHOXAZOLE) PENICILLIN V POTASSIUM (PENICILLIN V POTASSIUM)    Medication Administration  Injection # 1:    Medication: Vit B12 1000 mcg    Diagnosis: ANEMIA-NOS (ICD-285.9)    Route: IM    Site: L deltoid    Exp Date: 03/13/2009    Lot #: [id number]    Mfr: [employer]    Patient tolerated injection without complications    Given by: Gladis Riffle, RN (Nov 23, 2007 4:51 PM)  Orders Added: 1)  Vit B12 1000 mcg [J3420] 2)  Admin of Therapeutic Inj  intramuscular or subcutaneous [email or web address].Putnam    ]

## 2010-08-14 NOTE — Assessment & Plan Note (Signed)
Summary: CONSULT RE: PERSONAL MATTER/CJR   Vital Signs:  Patient profile:   70 year old male Height:      69 inches Weight:      231 pounds BMI:     34.24 Pulse rate:   76 / minute Pulse rhythm:   regular Resp:     12 per minute BP sitting:   128 / 56  (left arm) Cuff size:   regular  Vitals Entered By: Gladis Riffle, RN (December 25, 2009 11:31 AM) CC: discuss personal matter Is Patient Diabetic? Yes Did you bring your meter with you today? No Comments does not do CBGs at home   CC:  discuss personal matter.  Preventive Screening-Counseling & Management  Alcohol-Tobacco     Smoking Status: quit     Year Quit: 1990     Pack years: 4 ppd  Current Medications (verified): 1)  Actos 45 Mg Tabs (Pioglitazone Hcl) .... Take 1 Tablet By Mouth Once A Day 2)  Aspir-81 81 Mg Tbec (Aspirin) .... Take 1 Tablet By Mouth Once A Day 3)  Cyanocobalamin 1000 Mcg/ml Soln (Cyanocobalamin) .... Inject 4)  Glipizide 10 Mg Tabs (Glipizide) .... Take 1 Tablet By Mouth Once A Day 5)  Januvia 100 Mg  Tabs (Sitagliptin Phosphate) .... Take 1 Tablet By Mouth Once A Day 6)  Lipitor 20 Mg Tabs (Atorvastatin Calcium) .... Take 1 Tablet By Mouth Once A Day 7)  Lisinopril 5 Mg Tabs (Lisinopril) .... Take 1 Tablet By Mouth Once A Day 8)  Metformin Hcl 1000 Mg Tabs (Metformin Hcl) .... Take 1 Tablet By Mouth Twice A Day 9)  Calcium 600/vitamin D 600-200 Mg-Unit  Tabs (Calcium Carbonate-Vitamin D) .... Once Daily 10)  Iron 28 Mg  Tabs (Ferrous Sulfate) .... Once Daily 11)  Doxazosin Mesylate 4 Mg Tabs (Doxazosin Mesylate) .... Take 1 Tablet By Mouth Once A Day 12)  Multivitamins  Tabs (Multiple Vitamin) .... Once Daily 13)  Vitamin D 1000 Unit Tabs (Cholecalciferol) .... Once Daily--Hold While On 50000u 14)  D3-50 50000 Unit Caps (Cholecalciferol) .... Take On Once Weekly X 12 Weeks--Call For Vit D Level For 4 Weeks After Completion 15)  Nystatin-Triamcinolone 100000-0.1 Unit/gm-% Crea (Nystatin-Triamcinolone)  .... Apply Two Times A Day To Affected Area  Allergies: 1)  Sulfamethoxazole (Sulfamethoxazole) 2)  Penicillin V Potassium (Penicillin V Potassium)   Impression & Recommendations:  Problem # 1:  DEPRESSIVE DISORDER (ICD-311) face-to-face discussion with the patient greater than 25 minutes. Discussed agitation, depression. I think he would benefit from a low dose SSRI. Side effects discussed. Followup one month. His updated medication list for this problem includes:    Citalopram Hydrobromide 10 Mg Tabs (Citalopram hydrobromide) .[name]... Take 1 tab by mouth daily  Complete Medication List: 1)  Actos 45 Mg Tabs (Pioglitazone hcl) .... Take 1 tablet by mouth once a day 2)  Aspir-81 81 Mg Tbec (Aspirin) .... Take 1 tablet by mouth once a day 3)  Cyanocobalamin 1000 Mcg/ml Soln (Cyanocobalamin) .... Inject 4)  Glipizide 10 Mg Tabs (Glipizide) .... Take 1 tablet by mouth once a day 5)  Januvia 100 Mg Tabs (Sitagliptin phosphate) .... Take 1 tablet by mouth once a day 6)  Lipitor 20 Mg Tabs (Atorvastatin calcium) .... Take 1 tablet by mouth once a day 7)  Lisinopril 5 Mg Tabs (Lisinopril) .... Take 1 tablet by mouth once a day 8)  Metformin Hcl 1000 Mg Tabs (Metformin hcl) .... Take 1 tablet by mouth twice a day 9)  Calcium 600/vitamin D 600-200 Mg-unit Tabs (Calcium carbonate-vitamin d) .... Once daily 10)  Iron 28 Mg Tabs (Ferrous sulfate) .... Once daily 11)  Doxazosin Mesylate 4 Mg Tabs (Doxazosin mesylate) .... Take 1 tablet by mouth once a day 12)  Multivitamins Tabs (Multiple vitamin) .... Once daily 13)  Vitamin D 1000 Unit Tabs (Cholecalciferol) .... Once daily--hold while on 50000u 14)  D3-50 50000 Unit Caps (Cholecalciferol) .... Take on once weekly x 12 weeks--call for vit d level for 4 weeks after completion 15)  Nystatin-triamcinolone 100000-0.1 Unit/gm-% Crea (Nystatin-triamcinolone) .... Apply two times a day to affected area 16)  Citalopram Hydrobromide 10 Mg Tabs (Citalopram  hydrobromide) .... Take 1 tab by mouth daily  Patient Instructions: 1)  Please schedule a follow-up appointment in 1 month. Prescriptions: CITALOPRAM HYDROBROMIDE 10 MG  TABS (CITALOPRAM HYDROBROMIDE) Take 1 tab by mouth daily  #30 x 3   Entered and Authorized by:   Birdie Sons MD   Signed by:   Birdie Sons MD on 12/25/2009   Method used:   Electronically to        [facility]  [phone]* (retail)       [address]       [place], [place]  [address]       Ph: [phone] or [phone]       Fax: [phone]   RxID:   [id number]   Appended Document: CONSULT RE: PERSONAL MATTER/CJR 25 mins > 50% spent counseling and cordinating treatment for pt depression".

## 2010-08-14 NOTE — Assessment & Plan Note (Signed)
Summary: LABS/VIT B LEVEL/RCD   Nurse Visit   Allergies: 1)  Sulfamethoxazole (Sulfamethoxazole) 2)  Penicillin V Potassium (Penicillin V Potassium)  Orders Added: 1)  Venipuncture [16109] 2)  Specimen Handling [99000] 3)  TLB-B12, Serum-Total ONLY [60454-U98]

## 2010-08-14 NOTE — Assessment & Plan Note (Signed)
Summary: CPX //RS   Vital Signs:  Patient profile:   70 year old male Height:      69 inches Weight:      223 pounds BMI:     33.05 Pulse rate:   64 / minute Resp:     12 per minute BP sitting:   118 / 62  (left arm)  Vitals Entered By: Gladis Riffle, RN (July 17, 2009 9:15 AM)   History of Present Illness: cpx  fall on ice two weeks ago---still with some sacral discmfort  Preventive Screening-Counseling & Management  Alcohol-Tobacco     Smoking Status: quit     Year Quit: 1990     Pack years: 4 ppd  Current Problems (verified): 1)  Obstructive Sleep Apnea  (ICD-327.23) 2)  Hyperlipidemia  (ICD-272.4) 3)  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 Take 1 Tablet By Mouth Once A Day 2)  Aspir-81 81 Mg Tbec (Aspirin) .... Take 1 Tablet By Mouth Once A Day 3)  Cyanocobalamin 1000 Mcg/ml Soln (Cyanocobalamin) .... Inject 4)  Glipizide 10 Mg Tabs (Glipizide) .... Take 1 Tablet By Mouth Once A Day 5)  Januvia 100 Mg  Tabs (Sitagliptin Phosphate) .... Take 1 Tablet By Mouth Once A Day 6)  Lipitor 20 Mg Tabs (Atorvastatin Calcium) .... Take 1 Tablet By Mouth Once A Day 7)  Lisinopril 5 Mg Tabs (Lisinopril) .... Take 1 Tablet By Mouth Once A Day 8)  Metformin Hcl 1000 Mg Tabs (Metformin Hcl) .... Take 1 Tablet By Mouth Twice A Day 9)  Calcium 600/vitamin D 600-200 Mg-Unit  Tabs (Calcium Carbonate-Vitamin D) .... Once Daily 10)  Iron 28 Mg  Tabs (Ferrous Sulfate) .... Once Daily 11)  Doxazosin Mesylate 4 Mg Tabs (Doxazosin Mesylate) .... Take 1 Tablet By Mouth Once A Day 12)  Multivitamins  Tabs (Multiple Vitamin) .... Once Daily 13)  Ibuprofen 600 Mg Tabs (Ibuprofen) .... Take 1 Tablet By Mouth Two Times A  Day  Allergies: 1)  Sulfamethoxazole (Sulfamethoxazole) 2)  Penicillin V Potassium (Penicillin V Potassium)  Comments:  Nurse/Medical Assistant: annual review, fasting, fell on ice couple weeks ago; hit head and bled but ok now--does not do CBGs at home  The patient's medications and allergies were reviewed with the patient and were updated in the Medication and Allergy Lists. 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 Former Smoker. started at age 25.  less than 4 ppd. Alcohol use-yes  Risk Factors: Smoking Status: quit (07/17/2009)  Physical Exam  General:  obese male in nad Head:  normocephalic and atraumatic.   Eyes:  pupils equal and pupils round.   Ears:  R ear normal and L ear normal.   Nose:  nose  piercing noted and no external erythema.   Neck:  No deformities, masses, or tenderness noted. Chest Wall:  No deformities, masses, tenderness or gynecomastia noted. Lungs:  Normal respiratory effort, chest expands symmetrically. Lungs are clear to auscultation, no crackles or wheezes. Heart:  Normal rate and regular rhythm. S1 and S2 normal without gallop, murmur, click, rub or other extra sounds. Abdomen:  overweight, active bowel sounds, soft, nontender. Prostate:  refused Msk:  No deformity or  scoliosis noted of thoracic or lumbar spine.   Pulses:  R radial normal and L radial normal.   Neurologic:  cranial nerves II-XII intact and gait normal.   Skin:  turgor normal and color normal.   Cervical Nodes:  no anterior cervical adenopathy and no posterior cervical adenopathy.   Psych:  memory intact for recent and remote and good eye contact.    Diabetes Management Exam:    Foot Exam (with socks and/or shoes not present):       Inspection:          Left foot: normal          Right foot: normal    Eye Exam:       Eye Exam done elsewhere          Date: 06/12/2009          Results: normal          Done by: ophthalmologist   Impression & Recommendations:  Problem # 1:  Preventive Health Care (ICD-V70.0) health maint UTD I'll review colonoscopy  Problem # 2:  HYPERLIPIDEMIA (ICD-272.4) health maint UTD His updated medication list for this problem includes:    Lipitor 20 Mg Tabs (Atorvastatin calcium) .[name]... Take 1 tablet by mouth once a day  Labs Reviewed: SGOT: 22 (07/19/2008)   SGPT: 23 (07/19/2008)   HDL:55.0 (07/19/2008)  LDL:42 (07/19/2008)  Chol:108 (07/19/2008)  Trig:56 (07/19/2008)  Orders: Venipuncture (14782)  Problem # 3:  DIABETES MELLITUS, TYPE II (ICD-250.00)  followed by dr gegick His updated medication list for this problem includes:    Actos 45 Mg Tabs (Pioglitazone hcl) .[name]... Take 1 tablet by mouth once a day    Aspir-81 81 Mg Tbec (Aspirin) .[name]... Take 1 tablet by mouth once a day    Glipizide 10 Mg Tabs (Glipizide) .[name]... Take 1 tablet by mouth once a day    Januvia 100 Mg Tabs (Sitagliptin phosphate) .[name]... Take 1 tablet by mouth once a day    Lisinopril 5 Mg Tabs (Lisinopril) .[name]... Take 1 tablet by mouth once a day    Metformin Hcl 1000 Mg Tabs (Metformin hcl) .[name]... Take 1 tablet by mouth twice a day  Labs Reviewed: Creat: 0.9 (07/19/2008)     Last Eye Exam: normal (06/12/2009) Reviewed HgBA1c results: 6.4 (07/19/2008)  Orders: Venipuncture  (95621)  Problem # 4:  ANEMIA-NOS (ICD-285.9) check labs today His updated medication list for this problem includes:    Cyanocobalamin 1000 Mcg/ml Soln (Cyanocobalamin) ..... Inject    Iron 28 Mg Tabs (Ferrous sulfate) ..... Once daily  Orders: Venipuncture (30865) TLB-CBC Platelet - w/Differential (85025-CBCD) TLB-IBC Pnl (Iron/FE;Transferrin) (83550-IBC) TLB-Ferritin (82728-FER) TLB-TSH (Thyroid Stimulating Hormone) (84443-TSH)  Complete Medication List: 1)  Actos 45 Mg Tabs (Pioglitazone hcl) .... Take 1 tablet by mouth once a day 2)  Aspir-81 81 Mg Tbec (Aspirin) .... Take 1 tablet by mouth once a day 3)  Cyanocobalamin 1000 Mcg/ml Soln (Cyanocobalamin) .... Inject 4)  Glipizide 10 Mg Tabs (Glipizide) .... Take 1 tablet by mouth  once a day 5)  Januvia 100 Mg Tabs (Sitagliptin phosphate) .... Take 1 tablet by mouth once a day 6)  Lipitor 20 Mg Tabs (Atorvastatin calcium) .... Take 1 tablet by mouth once a day 7)  Lisinopril 5 Mg Tabs (Lisinopril) .... Take 1 tablet by mouth once a day 8)  Metformin Hcl 1000 Mg Tabs (Metformin hcl) .... Take 1 tablet by mouth twice a day 9)  Calcium 600/vitamin D 600-200 Mg-unit Tabs (Calcium carbonate-vitamin d) .... Once daily 10)  Iron 28 Mg Tabs (Ferrous sulfate) .... Once daily 11)  Doxazosin Mesylate 4 Mg Tabs (Doxazosin mesylate) .... Take 1 tablet by mouth once a day 12)  Multivitamins Tabs (Multiple vitamin) .... Once daily   Patient Instructions: 1)  Please schedule a follow-up appointment in 6 months.  Appended Document: CPX //RS   Appended Document: CPX //RS   Medication Administration  Injection # 1:    Medication: Vit B12 1000 mcg    Diagnosis: ANEMIA-NOS (ICD-285.9)    Route: IM    Site: L deltoid    Exp Date: 02/11/2011    Lot #: [id number]    Mfr: American Regent    Patient tolerated injection without complications    Given by: Gladis Riffle, RN (July 17, 2009 12:08 PM)  Orders Added: 1)  Vit B12 1000 mcg [J3420] 2)   Admin of Therapeutic Inj  intramuscular or subcutaneous [96045]

## 2010-08-14 NOTE — Assessment & Plan Note (Signed)
Summary: B-12INJ/RCD   Nurse Visit   Allergies: 1)  Sulfamethoxazole (Sulfamethoxazole) 2)  Penicillin V Potassium (Penicillin V Potassium)  Medication Administration  Injection # 1:    Medication: Vit B12 1000 mcg    Diagnosis: ANEMIA-NOS (ICD-285.9)    Route: IM    Site: R deltoid    Exp Date: 7/13    Lot #: [id number]    Mfr: American Regent    Patient tolerated injection without complications    Given by: Alfred Levins, CMA (June 18, 2010 3:23 PM)  Orders Added: 1)  Vit B12 1000 mcg [J3420] 2)  Admin of Therapeutic Inj  intramuscular or subcutaneous [40981]

## 2010-08-14 NOTE — Assessment & Plan Note (Signed)
Summary: B-12 INJ/RCD   Nurse Visit   Allergies: 1)  Sulfamethoxazole (Sulfamethoxazole) 2)  Penicillin V Potassium (Penicillin V Potassium)  Medication Administration  Injection # 1:    Medication: Vit B12 1000 mcg    Diagnosis: ANEMIA-NOS (ICD-285.9)    Route: IM    Site: L deltoid    Exp Date: 03/14/2011    Lot #: [id number]    Mfr: American Regent    Patient tolerated injection without complications    Given by: Gladis Riffle, RN (September 11, 2009 1:36 PM)  Orders Added: 1)  Vit B12 1000 mcg [J3420] 2)  Admin of Therapeutic Inj  intramuscular or subcutaneous [96372]   Medication Administration  Injection # 1:    Medication: Vit B12 1000 mcg    Diagnosis: ANEMIA-NOS (ICD-285.9)    Route: IM    Site: L deltoid    Exp Date: 03/14/2011    Lot #: [id number]    Mfr: [employer] Regent    Patient tolerated injection without complications    Given by: Gladis Riffle, RN (September 11, 2009 1:36 PM)  Orders Added: 1)  Vit B12 1000 mcg [J3420] 2)  Admin of Therapeutic Inj  intramuscular or subcutaneous [47829]

## 2010-08-14 NOTE — Assessment & Plan Note (Signed)
Summary: b12 inj/njr--get stool cards/et   Nurse Visit   Allergies: 1)  Sulfamethoxazole (Sulfamethoxazole) 2)  Penicillin V Potassium (Penicillin V Potassium)  Medication Administration  Injection # 1:    Medication: Vit B12 1000 mcg    Diagnosis: ANEMIA-NOS (ICD-285.9)    Route: IM    Site: L deltoid    Exp Date: 10/12/2010    Lot #: [id number]    Mfr: American Regent    Patient tolerated injection without complications    Given by: Gladis Riffle, RN (August 14, 2009 1:21 PM)  Orders Added: 1)  Vit B12 1000 mcg [J3420] 2)  Admin of Therapeutic Inj  intramuscular or subcutaneous [96372]   Medication Administration  Injection # 1:    Medication: Vit B12 1000 mcg    Diagnosis: ANEMIA-NOS (ICD-285.9)    Route: IM    Site: L deltoid    Exp Date: 10/12/2010    Lot #: [id number]    Mfr: American Regent    Patient tolerated injection without complications    Given by: Gladis Riffle, RN (August 14, 2009 1:21 PM)  Orders Added: 1)  Vit B12 1000 mcg [J3420] 2)  Admin of Therapeutic Inj  intramuscular or subcutaneous [11914]

## 2010-08-14 NOTE — Assessment & Plan Note (Signed)
Summary: B12 INJ/NJR   Nurse Visit   Allergies: 1)  Sulfamethoxazole (Sulfamethoxazole) 2)  Penicillin V Potassium (Penicillin V Potassium)  Medication Administration  Injection # 1:    Medication: Vit B12 1000 mcg    Diagnosis: ANEMIA-NOS (ICD-285.9)    Route: IM    Site: L deltoid    Exp Date: 02/11/2011    Lot #: [id number]    Mfr: American Regent    Patient tolerated injection without complications    Given by: Gladis Riffle, RN (June 12, 2009 1:47 PM)  Orders Added: 1)  Vit B12 1000 mcg [J3420] 2)  Admin of Therapeutic Inj  intramuscular or subcutaneous [96372]   Medication Administration  Injection # 1:    Medication: Vit B12 1000 mcg    Diagnosis: ANEMIA-NOS (ICD-285.9)    Route: IM    Site: L deltoid    Exp Date: 02/11/2011    Lot #: [id number]    Mfr: [employer] Regent    Patient tolerated injection without complications    Given by: Gladis Riffle, RN (June 12, 2009 1:47 PM)  Orders Added: 1)  Vit B12 1000 mcg [J3420] 2)  Admin of Therapeutic Inj  intramuscular or subcutaneous [[id number]]

## 2010-08-14 NOTE — Assessment & Plan Note (Signed)
Summary: b12 inj/njr   Nurse Visit    Prior Medications: ACTOS 45 MG TABS (PIOGLITAZONE HCL) Take 1 tablet by mouth once a day ASPIR-81 81 MG TBEC (ASPIRIN) Take 1 tablet by mouth once a day CYANOCOBALAMIN 1000 MCG/ML SOLN (CYANOCOBALAMIN) Inject GLIPIZIDE 10 MG TABS (GLIPIZIDE) Take 1 tablet by mouth once a day JANUVIA 100 MG  TABS (SITAGLIPTIN PHOSPHATE) Take 1 tablet by mouth once a day LIPITOR 20 MG TABS (ATORVASTATIN CALCIUM) Take 1 tablet by mouth once a day LISINOPRIL 5 MG TABS (LISINOPRIL) Take 1 tablet by mouth once a day METFORMIN HCL 1000 MG TABS (METFORMIN HCL) Take 1 tablet by mouth twice a day CALCIUM 600/VITAMIN D 600-200 MG-UNIT  TABS (CALCIUM CARBONATE-VITAMIN D) once daily IRON 28 MG  TABS (FERROUS SULFATE) once daily DOXAZOSIN MESYLATE 2 MG TABS (DOXAZOSIN MESYLATE) 1 by mouth at bedtime MULTIVITAMINS  TABS (MULTIPLE VITAMIN) once daily IBUPROFEN 600 MG TABS (IBUPROFEN) Take 1 tablet by mouth two times a day Current Allergies: SULFAMETHOXAZOLE (SULFAMETHOXAZOLE) PENICILLIN V POTASSIUM (PENICILLIN V POTASSIUM)    Medication Administration  Injection # 1:    Medication: Vit B12 1000 mcg    Diagnosis: ANEMIA-NOS (ICD-285.9)    Route: IM    Site: L deltoid    Exp Date: 04/12/2010    Lot #: [id number]    Mfr: [employer] Regent    Patient tolerated injection without complications    Given by: Kern Reap CMA (September 19, 2008 1:44 PM)  Orders Added: 1)  Vit B12 1000 mcg [J3420] 2)  Admin of Therapeutic Inj  intramuscular or subcutaneous [email or web address].Putnam    ]

## 2010-08-14 NOTE — Assessment & Plan Note (Signed)
Summary: B12 INJ // RS   Nurse Visit   Allergies: 1)  Sulfamethoxazole (Sulfamethoxazole) 2)  Penicillin V Potassium (Penicillin V Potassium)  Medication Administration  Injection # 1:    Medication: Vit B12 1000 mcg    Diagnosis: ANEMIA-NOS (ICD-285.9)    Route: IM    Site: R deltoid    Exp Date: 01/11/2012    Lot #: [id number]    Mfr: [employer] Regent    Patient tolerated injection without complications    Given by: Lucious Groves CMA (March 19, 2010 1:09 PM)  Orders Added: 1)  Venipuncture [16109] 2)  Specimen Handling [99000] 3)  T-Vitamin D (25-Hydroxy) [60454-09811] 4)  Vit B12 1000 mcg [J3420] 5)  Admin of Therapeutic Inj  intramuscular or subcutaneous [96372] 6)  T-Celiac Disease Ab Evaluation [8002] 7)  TLB-CBC Platelet - w/Differential [85025-CBCD] 8)  TLB-Hepatic/Liver Function Pnl [80076-HEPATIC] 9)  TLB-BMP (Basic Metabolic Panel-BMET) [80048-METABOL]

## 2010-08-14 NOTE — Assessment & Plan Note (Signed)
Summary: rov for osa   Chief Complaint:  Sleep Consult.  History of Present Illness: the pt comes in today for f/u of his osa.  He has not been seen in 4 years, but has remained compliant with cpap.  He feels that he is doing well with the device, and has been keeping up with mask changes.  He feels that he sleeps well, and denies significant EDS.  He is satisfied with his alertness level during the day.  His only complaint is that his humidifier does not seem to be operating properly.  His water level in the reservoir does not decrease significantly despite  the heater at max temp.    Prior Medications Reviewed Using: Patient Recall  Updated Prior Medication List: ACTOS 45 MG TABS (PIOGLITAZONE HCL) Take 1 tablet by mouth once a day ASPIR-81 81 MG TBEC (ASPIRIN) Take 1 tablet by mouth once a day CYANOCOBALAMIN 1000 MCG/ML SOLN (CYANOCOBALAMIN) Inject GLIPIZIDE 10 MG TABS (GLIPIZIDE) Take 1 tablet by mouth once a day JANUVIA 100 MG  TABS (SITAGLIPTIN PHOSPHATE) Take 1 tablet by mouth once a day LIPITOR 20 MG TABS (ATORVASTATIN CALCIUM) Take 1 tablet by mouth once a day LISINOPRIL 5 MG TABS (LISINOPRIL) Take 1 tablet by mouth once a day METFORMIN HCL 1000 MG TABS (METFORMIN HCL) Take 1 tablet by mouth twice a day CALCIUM 600/VITAMIN D 600-200 MG-UNIT  TABS (CALCIUM CARBONATE-VITAMIN D) once daily IRON 28 MG  TABS (FERROUS SULFATE) once daily DOXAZOSIN MESYLATE 2 MG TABS (DOXAZOSIN MESYLATE) 1 by mouth at bedtime MULTIVITAMINS  TABS (MULTIPLE VITAMIN) once daily IBUPROFEN 600 MG TABS (IBUPROFEN) Take 1 tablet by mouth two times a day  Current Allergies: SULFAMETHOXAZOLE (SULFAMETHOXAZOLE) PENICILLIN V POTASSIUM (PENICILLIN V POTASSIUM)   Past Medical History:    Reviewed history from 07/27/2007 and no changes required:       Allergic rhinitis       Diabetes mellitus, type II       Hypertension       Ischemic optic neuropathy L 2001       Anemia-NOS iron deficient and B12 deficient        Hyperlipidemia       OSA  Family History:    mother sudden death-67yo    father stroke    brothe CAD                allergies: father, children    asthma: father    heart disease: mother    cancer: mother (unsure)   Social History:    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    Former Smoker. started at age 79.  less than 4 ppd.    Alcohol use-yes   Risk Factors:     Comments:  started at age 14   Review of Systems      See HPI  Vital Signs:  Patient Profile:   70 Years Old Male Weight:      229.13 pounds O2 Sat:      97 % O2 treatment:    Room Air Temp:     97.4 degrees F oral Pulse rate:   83 / minute BP sitting:   132 / 74  (left arm) Cuff size:   regular  Vitals Entered By: Arman Filter LPN (September 13, 1608 2:04 PM)             Is Patient Diabetic? No Comments Medications reviewed with patient Arman Filter LPN  September 13, 2008 2:04 PM     Physical Exam  General:     obese male in nad Nose:     no skin breakdown or pressure necrosis from the cpap mask   Impression & Recommendations:  Problem # 1:  OBSTRUCTIVE SLEEP APNEA (ICD-327.23) the pt has been compliant with his cpap device, and has adequate symptom control.  I have encouraged him however to work on aggressive weight loss.  I will also have his dme look at his heated humidifier and make sure it is functioning properly.  Medications Added to Medication List This Visit: 1)  Ibuprofen 600 Mg Tabs (Ibuprofen) .... Take 1 tablet by mouth two times a day  Patient Instructions: 1)  work on weight loss 2)  will get sleep med to check your humidifier. 3)  follow up with me in 12mos

## 2010-08-14 NOTE — Assessment & Plan Note (Signed)
Summary: 4 MONTH ROA/JLS   Vital Signs:  Patient Profile:   70 Years Old Male Weight:      226 pounds Temp:     98.4 degrees F oral Pulse rate:   76 / minute Pulse rhythm:   regular Resp:     16 per minute BP sitting:   110 / 72  Vitals Entered By: [name] CMA (March 02, 2007 3:00 PM)               Chief Complaint:  rov.  History of Present Illness: f/u of htn, dm, lipids---he sees Gegick for DM  Follow-Up Visit      This is a 70 year old man who presents for Follow-up visit.  The patient denies chest pain, palpitations, dizziness, syncope, low blood sugar symptoms, high blood sugar symptoms, edema, SOB, DOE, PND, and orthopnea.  Since the last visit the patient notes no new problems or concerns.  The patient reports taking meds as prescribed and not monitoring BP.  When questioned about possible medication side effects, the patient notes none.    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   Family History:    Reviewed history and no changes required:       mother sudden death-67yo       father stroke       brothe CAD  Social History:    Reviewed history and no changes required:       Occupation: transfers cars--auto auctions       Married       Former Smoker       Alcohol use-yes   Risk Factors:  Tobacco use:  quit    Year quit:  1990 Alcohol use:  yes   Review of Systems  The patient denies anorexia, fever, weight loss, vision  loss, decreased hearing, hoarseness, chest pain, syncope, dyspnea on exhertion, peripheral edema, prolonged cough, hemoptysis, abdominal pain, melena, hematochezia, severe indigestion/heartburn, hematuria, incontinence, muscle weakness, suspicious skin lesions, transient blindness, difficulty walking, depression, unusual weight change, abnormal bleeding, enlarged lymph nodes, angioedema, and testicular masses.     Physical Exam  General:     Well-developed,well-nourished,in no acute distress; alert,appropriate and cooperative throughout examination Mouth:     Oral mucosa and oropharynx without lesions or exudates.  Teeth in good repair. Neck:     No deformities, masses, or tenderness noted. Lungs:     Normal respiratory effort, chest expands symmetrically. Lungs are clear to auscultation, no crackles or wheezes. Heart:     Normal rate and regular rhythm. S1 and S2 normal without gallop, murmur, click, rub or other extra sounds. Abdomen:     Bowel sounds positive,abdomen soft and non-tender without masses, organomegaly or hernias noted. Msk:     No deformity or scoliosis noted of thoracic or lumbar spine.  Impression & Recommendations:  Problem # 1:  DIABETES MELLITUS, TYPE II (ICD-250.00) this is followed by dr. Dagoberto Ligas His updated medication list for this problem includes:    Actos 45 Mg Tabs (Pioglitazone hcl) .[name]... Take 1 tablet by mouth once a day    Aspir-81 81 Mg Tbec (Aspirin) .[name]... Take 1 tablet by mouth once a day    Glipizide 10 Mg Tabs (Glipizide) .[name]... Take 1 tablet by mouth once a day    Januvia 25 Mg Tabs (Sitagliptin phosphate) .[name]... Take once a day    Lisinopril 5 Mg Tabs (Lisinopril) .[name]... Take 1 tablet by mouth once a day    Metformin Hcl 1000 Mg Tabs (Metformin hcl) .[name]... Take 1 tablet by mouth twice a day   Problem # 2:  HYPERTENSION (ICD-401.9) adequately controlled His updated medication list for this problem includes:    Lisinopril 5 Mg Tabs  (Lisinopril) .[name]... Take 1 tablet by mouth once a day  BP today: 110/72   Problem # 3:  ALLERGIC RHINITIS (ICD-477.9) adequately controlled  Problem # 4:  ANEMIA-NOS (ICD-285.9) issues been ongoing for quite some time.  He's been diagnosed with B12 deficiency and Iron deficiency.  These had GI evaluation.  Will check appropriate laboratories as below. His updated medication list for this problem includes:    Cyanocobalamin 1000 Mcg/ml Soln (Cyanocobalamin) ..... 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 Take 1 tablet by mouth once a day 2)  Aspir-81 81 Mg Tbec (Aspirin) .... Take 1 tablet by mouth once a day 3)  Cyanocobalamin 1000 Mcg/ml Soln (Cyanocobalamin) .... Inject 4)  Glipizide 10 Mg Tabs (Glipizide) .... Take 1 tablet by mouth once a day 5)  Januvia 25 Mg Tabs (Sitagliptin phosphate) .... Take once a day 6)  Lipitor 20 Mg Tabs (Atorvastatin calcium) .... Take 1 tablet by mouth once a day 7)  Lisinopril 5 Mg Tabs (Lisinopril) .... Take 1 tablet by mouth once a day 8)  Metformin Hcl 1000 Mg Tabs (Metformin hcl) .... Take 1 tablet by mouth twice a day 9)  Calcium 600/vitamin D 600-200 Mg-unit Tabs (Calcium carbonate-vitamin d)   Patient Instructions: 1)  see me 6 months

## 2010-08-14 NOTE — Assessment & Plan Note (Signed)
Summary: B-12 INJ/RCD  Nurse Visit    Prior Medications: ACTOS 45 MG TABS (PIOGLITAZONE HCL) Take 1 tablet by mouth once a day ASPIR-81 81 MG TBEC (ASPIRIN) Take 1 tablet by mouth once a day CYANOCOBALAMIN 1000 MCG/ML SOLN (CYANOCOBALAMIN) Inject GLIPIZIDE 10 MG TABS (GLIPIZIDE) Take 1 tablet by mouth once a day JANUVIA 25 MG TABS (SITAGLIPTIN PHOSPHATE) Take once a day LIPITOR 20 MG TABS (ATORVASTATIN CALCIUM) Take 1 tablet by mouth once a day LISINOPRIL 5 MG TABS (LISINOPRIL) Take 1 tablet by mouth once a day METFORMIN HCL 1000 MG TABS (METFORMIN HCL) Take 1 tablet by mouth twice a day CALCIUM 600/VITAMIN D 600-200 MG-UNIT  TABS (CALCIUM CARBONATE-VITAMIN D)  Current Allergies: SULFAMETHOXAZOLE (SULFAMETHOXAZOLE) PENICILLIN V POTASSIUM (PENICILLIN V POTASSIUM)    Medication Administration  Injection # 1:    Medication: Vit B12 1000 mcg    Diagnosis: ANEMIA-NOS (ICD-285.9)    Route: SQ    Site: L deltoid    Exp Date: 10/11/2008    Lot #: [id number]    Mfr: american reagant    Patient tolerated injection without complications    Given by: [name] CMA (April 12, 2007 4:37 PM)  Orders Added: 1)  Vit B12 1000 mcg [J3420] 2)  Admin of Therapeutic Inj  intramuscular or subcutaneous Quintilian.[email or web address]    ]  Medication Administration  Injection # 1:    Medication: Vit B12 1000 mcg    Diagnosis: ANEMIA-NOS (ICD-285.9)    Route: SQ    Site: L deltoid    Exp Date: 10/11/2008    Lot #: [id number]    Mfr: american reagant    Patient tolerated injection without complications    Given by: [name] CMA (April 12, 2007 4:37 PM)  Orders Added: 1)  Vit B12 1000 mcg [J3420] 2)  Admin of Therapeutic Inj  intramuscular or subcutaneous [90772]

## 2010-08-14 NOTE — Assessment & Plan Note (Signed)
Summary: B-12 INJ/RCD  Nurse Visit    Prior Medications: ACTOS 45 MG TABS (PIOGLITAZONE HCL) Take 1 tablet by mouth once a day ASPIR-81 81 MG TBEC (ASPIRIN) Take 1 tablet by mouth once a day AZITHROMYCIN 250 MG TABS (AZITHROMYCIN) use 2 tablet by mouth as directed CEFTIN 500 MG TABS (CEFUROXIME AXETIL) Take 1 tablet by mouth twice a day CYANOCOBALAMIN 1000 MCG/ML SOLN (CYANOCOBALAMIN) Inject FLOMAX 0.4 MG CP24 (TAMSULOSIN HCL) Take 1 capsule by mouth once a day GLIPIZIDE 10 MG TABS (GLIPIZIDE) Take 1 tablet by mouth once a day JANUVIA 25 MG TABS (SITAGLIPTIN PHOSPHATE) Take once a day LIPITOR 20 MG TABS (ATORVASTATIN CALCIUM) Take 1 tablet by mouth once a day LISINOPRIL 5 MG TABS (LISINOPRIL) Take 1 tablet by mouth once a day METFORMIN HCL 1000 MG TABS (METFORMIN HCL) Take 1 tablet by mouth twice a day Current Allergies: SULFAMETHOXAZOLE (SULFAMETHOXAZOLE) PENICILLIN V POTASSIUM (PENICILLIN V POTASSIUM)    Medication Administration  Injection # 1:    Medication: Vit B12 1000 mcg    Diagnosis: ANEMIA-NOS (ICD-285.9)    Route: SQ    Site: R deltoid    Exp Date: 05/14/2008    Lot #: [id number]    Mfr: american    Patient tolerated injection without complications    Given by: [name] CMA (February 22, 2007 4:54 PM)  Orders Added: 1)  Vit B12 1000 mcg [J3420] 2)  Admin of Therapeutic Inj  intramuscular or subcutaneous [90772] 3)  Admin of Therapeutic Inj  intramuscular or subcutaneous [90772]

## 2010-08-14 NOTE — Assessment & Plan Note (Signed)
Summary: b12 inj/njr   Nurse Visit    Prior Medications: ACTOS 45 MG TABS (PIOGLITAZONE HCL) Take 1 tablet by mouth once a day ASPIR-81 81 MG TBEC (ASPIRIN) Take 1 tablet by mouth once a day CYANOCOBALAMIN 1000 MCG/ML SOLN (CYANOCOBALAMIN) Inject GLIPIZIDE 10 MG TABS (GLIPIZIDE) Take 1 tablet by mouth once a day JANUVIA 100 MG  TABS (SITAGLIPTIN PHOSPHATE) Take 1 tablet by mouth once a day LIPITOR 20 MG TABS (ATORVASTATIN CALCIUM) Take 1 tablet by mouth once a day LISINOPRIL 5 MG TABS (LISINOPRIL) Take 1 tablet by mouth once a day METFORMIN HCL 1000 MG TABS (METFORMIN HCL) Take 1 tablet by mouth twice a day CALCIUM 600/VITAMIN D 600-200 MG-UNIT  TABS (CALCIUM CARBONATE-VITAMIN D)  IRON 28 MG  TABS (FERROUS SULFATE) once daily NASONEX 50 MCG/ACT SUSP (MOMETASONE FUROATE) 1-2 each nostril qd DOXAZOSIN MESYLATE 2 MG TABS (DOXAZOSIN MESYLATE) 1 by mouth at bedtime or as directed Current Allergies: SULFAMETHOXAZOLE (SULFAMETHOXAZOLE) PENICILLIN V POTASSIUM (PENICILLIN V POTASSIUM)    Medication Administration  Injection # 1:    Medication: Vit B12 1000 mcg    Diagnosis: ANEMIA-NOS (ICD-285.9)    Route: IM    Site: L deltoid    Exp Date: 05/13/2009    Lot #: [id number]    Mfr: American Regent    Patient tolerated injection without complications    Given by: Gladis Riffle, RN (December 28, 2007 4:06 PM)  Orders Added: 1)  Vit B12 1000 mcg [J3420] 2)  Admin of Therapeutic Inj  intramuscular or subcutaneous [email or web address].Putnam    ]

## 2010-08-14 NOTE — Assessment & Plan Note (Signed)
Summary: b 12 inj/njr   Nurse Visit    Prior Medications: ACTOS 45 MG TABS (PIOGLITAZONE HCL) Take 1 tablet by mouth once a day ASPIR-81 81 MG TBEC (ASPIRIN) Take 1 tablet by mouth once a day CYANOCOBALAMIN 1000 MCG/ML SOLN (CYANOCOBALAMIN) Inject GLIPIZIDE 10 MG TABS (GLIPIZIDE) Take 1 tablet by mouth once a day JANUVIA 100 MG  TABS (SITAGLIPTIN PHOSPHATE) Take 1 tablet by mouth once a day LIPITOR 20 MG TABS (ATORVASTATIN CALCIUM) Take 1 tablet by mouth once a day LISINOPRIL 5 MG TABS (LISINOPRIL) Take 1 tablet by mouth once a day METFORMIN HCL 1000 MG TABS (METFORMIN HCL) Take 1 tablet by mouth twice a day CALCIUM 600/VITAMIN D 600-200 MG-UNIT  TABS (CALCIUM CARBONATE-VITAMIN D)  IRON 28 MG  TABS (FERROUS SULFATE) once daily NASONEX 50 MCG/ACT SUSP (MOMETASONE FUROATE) 1-2 each nostril qd DOXAZOSIN MESYLATE 2 MG TABS (DOXAZOSIN MESYLATE) 1 by mouth at bedtime or as directed Current Allergies: SULFAMETHOXAZOLE (SULFAMETHOXAZOLE) PENICILLIN V POTASSIUM (PENICILLIN V POTASSIUM)    Medication Administration  Injection # 1:    Medication: Vit B12 1000 mcg    Diagnosis: ANEMIA-NOS (ICD-285.9)    Route: IM    Site: L deltoid    Exp Date: 05/22/2009    Lot #: [id number]    Mfr: American Regent    Patient tolerated injection without complications    Given by: Sid Falcon LPN (March 07, 2008 4:01 PM)  Orders Added: 1)  Vit B12 1000 mcg [email or web address].[email or web address]    ]  Appended Document: Orders Update     Clinical Lists Changes  Orders: Added new Service order of Admin of Therapeutic Inj  intramuscular or subcutaneous (52841) - Signed       Orders Added: 1)  Admin of Therapeutic Inj  intramuscular or subcutaneous [[id number]] Pt was not charged for admin fee, when injection was originally given  Trixie Dredge  March 09, 2008 7:57 AM

## 2010-08-14 NOTE — Assessment & Plan Note (Signed)
Summary: b-12/db/pt rescd/ccm   Nurse Visit    Prior Medications: ACTOS 45 MG TABS (PIOGLITAZONE HCL) Take 1 tablet by mouth once a day ASPIR-81 81 MG TBEC (ASPIRIN) Take 1 tablet by mouth once a day CYANOCOBALAMIN 1000 MCG/ML SOLN (CYANOCOBALAMIN) Inject GLIPIZIDE 10 MG TABS (GLIPIZIDE) Take 1 tablet by mouth once a day JANUVIA 100 MG  TABS (SITAGLIPTIN PHOSPHATE) Take 1 tablet by mouth once a day LIPITOR 20 MG TABS (ATORVASTATIN CALCIUM) Take 1 tablet by mouth once a day LISINOPRIL 5 MG TABS (LISINOPRIL) Take 1 tablet by mouth once a day METFORMIN HCL 1000 MG TABS (METFORMIN HCL) Take 1 tablet by mouth twice a day CALCIUM 600/VITAMIN D 600-200 MG-UNIT  TABS (CALCIUM CARBONATE-VITAMIN D)  IRON 28 MG  TABS (FERROUS SULFATE) once daily NASONEX 50 MCG/ACT SUSP (MOMETASONE FUROATE) 1-2 each nostril qd DOXAZOSIN MESYLATE 2 MG TABS (DOXAZOSIN MESYLATE) 1 by mouth at bedtime or as directed Current Allergies: SULFAMETHOXAZOLE (SULFAMETHOXAZOLE) PENICILLIN V POTASSIUM (PENICILLIN V POTASSIUM)    Medication Administration  Injection # 1:    Medication: Vit B12 1000 mcg    Diagnosis: ANEMIA-NOS (ICD-285.9)    Route: IM    Site: R deltoid    Exp Date: 05/13/2009    Lot #: [id number]    Mfr: American Regent    Patient tolerated injection without complications    Given by: Kern Reap CMA (February 07, 2008 5:05 PM)  Orders Added: 1)  Vit B12 1000 mcg [J3420] 2)  Admin of Therapeutic Inj  intramuscular or subcutaneous Lepidus.Putnam    ]

## 2010-08-26 ENCOUNTER — Encounter: Payer: Self-pay | Admitting: Internal Medicine

## 2010-08-27 ENCOUNTER — Ambulatory Visit (INDEPENDENT_AMBULATORY_CARE_PROVIDER_SITE_OTHER): Payer: MEDICARE | Admitting: Internal Medicine

## 2010-08-27 DIAGNOSIS — D649 Anemia, unspecified: Secondary | ICD-10-CM

## 2010-08-27 MED ORDER — CYANOCOBALAMIN 1000 MCG/ML IJ SOLN
1000.0000 ug | INTRAMUSCULAR | Status: DC
  Administered 2010-08-27 – 2012-01-27 (×13): 1000 ug via INTRAMUSCULAR

## 2010-09-17 ENCOUNTER — Ambulatory Visit (INDEPENDENT_AMBULATORY_CARE_PROVIDER_SITE_OTHER): Payer: MEDICARE | Admitting: Internal Medicine

## 2010-09-17 ENCOUNTER — Encounter: Payer: Self-pay | Admitting: Internal Medicine

## 2010-09-17 DIAGNOSIS — D649 Anemia, unspecified: Secondary | ICD-10-CM

## 2010-09-17 DIAGNOSIS — E119 Type 2 diabetes mellitus without complications: Secondary | ICD-10-CM

## 2010-09-17 DIAGNOSIS — I10 Essential (primary) hypertension: Secondary | ICD-10-CM

## 2010-09-17 DIAGNOSIS — H47019 Ischemic optic neuropathy, unspecified eye: Secondary | ICD-10-CM

## 2010-09-17 DIAGNOSIS — Z0000 Encounter for general adult medical examination without abnormal findings: Secondary | ICD-10-CM

## 2010-09-17 DIAGNOSIS — Z125 Encounter for screening for malignant neoplasm of prostate: Secondary | ICD-10-CM

## 2010-09-17 DIAGNOSIS — N320 Bladder-neck obstruction: Secondary | ICD-10-CM

## 2010-09-17 DIAGNOSIS — Z23 Encounter for immunization: Secondary | ICD-10-CM

## 2010-09-17 DIAGNOSIS — E559 Vitamin D deficiency, unspecified: Secondary | ICD-10-CM

## 2010-09-17 LAB — LIPID PANEL
Cholesterol: 113 mg/dL (ref 0–200)
HDL: 52.5 mg/dL (ref >39.00)
LDL Cholesterol: 49 mg/dL (ref 0–99)
Total CHOL/HDL Ratio: 2
Triglycerides: 60 mg/dL (ref 0.0–149.0)
VLDL: 12 mg/dL (ref 0.0–40.0)

## 2010-09-17 LAB — CBC WITH DIFFERENTIAL/PLATELET
Basophils Absolute: 0 10*3/uL (ref 0.0–0.1)
Basophils Relative: 0.6 % (ref 0.0–3.0)
Eosinophils Absolute: 0.2 10*3/uL (ref 0.0–0.7)
Eosinophils Relative: 4.6 % (ref 0.0–5.0)
HCT: 35.8 % — ABNORMAL LOW (ref 39.0–52.0)
Hemoglobin: 12 g/dL — ABNORMAL LOW (ref 13.0–17.0)
Lymphocytes Relative: 25.5 % (ref 12.0–46.0)
Lymphs Abs: 1.3 10*3/uL (ref 0.7–4.0)
MCHC: 33.6 g/dL (ref 30.0–36.0)
MCV: 85.3 fl (ref 78.0–100.0)
Monocytes Absolute: 0.5 10*3/uL (ref 0.1–1.0)
Monocytes Relative: 9.8 % (ref 3.0–12.0)
Neutro Abs: 3.1 10*3/uL (ref 1.4–7.7)
Neutrophils Relative %: 59.5 % (ref 43.0–77.0)
Platelets: 165 10*3/uL (ref 150.0–400.0)
RBC: 4.2 Mil/uL — ABNORMAL LOW (ref 4.22–5.81)
RDW: 14.1 % (ref 11.5–14.6)
WBC: 5.2 10*3/uL (ref 4.5–10.5)

## 2010-09-17 LAB — POCT URINALYSIS DIPSTICK
Bilirubin, UA: NEGATIVE
Blood, UA: NEGATIVE
Glucose, UA: NEGATIVE
Leukocytes, UA: NEGATIVE
Nitrite, UA: NEGATIVE
Spec Grav, UA: 1.025
Urobilinogen, UA: 0.2
pH, UA: 6

## 2010-09-17 LAB — BASIC METABOLIC PANEL
BUN: 22 mg/dL (ref 6–23)
CO2: 31 mEq/L (ref 19–32)
Calcium: 9.2 mg/dL (ref 8.4–10.5)
Chloride: 99 mEq/L (ref 96–112)
Creatinine, Ser: 1 mg/dL (ref 0.4–1.5)
GFR: 77.18 mL/min (ref >60.00)
Glucose, Bld: 149 mg/dL — ABNORMAL HIGH (ref 70–99)
Potassium: 4.4 mEq/L (ref 3.5–5.1)
Sodium: 137 mEq/L (ref 135–145)

## 2010-09-17 LAB — HEPATIC FUNCTION PANEL
ALT: 27 U/L (ref 0–53)
AST: 23 U/L (ref 0–37)
Albumin: 3.7 g/dL (ref 3.5–5.2)
Alkaline Phosphatase: 49 U/L (ref 39–117)
Bilirubin, Direct: 0.1 mg/dL (ref 0.0–0.3)
Total Bilirubin: 0.5 mg/dL (ref 0.3–1.2)
Total Protein: 6 g/dL (ref 6.0–8.3)

## 2010-09-17 LAB — PSA: PSA: 1.05 ng/mL (ref 0.10–4.00)

## 2010-09-17 LAB — VITAMIN B12: Vitamin B-12: 645 pg/mL (ref 211–911)

## 2010-09-17 LAB — HEMOGLOBIN A1C: Hgb A1c MFr Bld: 6.7 % — ABNORMAL HIGH (ref 4.6–6.5)

## 2010-09-17 LAB — TSH: TSH: 1.12 u[IU]/mL (ref 0.35–5.50)

## 2010-09-17 NOTE — Progress Notes (Signed)
  Subjective:    Patient ID: Frank [name], male    DOB: [birth year], 70 y.o.   MRN: [id number]  HPI  patient comes in for followup of multiple medical problems including type 2 diabetes, hyperlipidemia, hypertension. The patient does not check blood sugar or blood pressure at home. The patetient does not follow an exercise or diet program. The patient denies any polyuria, polydipsia.  In the past the patient has gone to diabetic treatment center. The patient is tolerating medications  Without difficulty. The patient does admit to medication compliance. He also reports recent weight gain.   Past Medical History  Diagnosis Date  . Allergy     Rhinitis  . Diabetes mellitus     Type 2  . Hypertension   . 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 Anemia     NOS iron deficient and B12 deficient  . Hyperlipidemia   . OSA (obstructive sleep apnea)    Past Surgical History  Procedure Date  . Appendectomy   . Rotator cuff repair   . Nasal sinus surgery 1959    Deviated Septum  . 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 He does not have any smokeless tobacco history on file. He reports that he drinks alcohol. His drug history not on file. family history includes Allergies in his father; Breast cancer in his mother; Coronary artery disease in his brother; Heart disease in his mother; and Stroke in his father.  There is no history of Diabetes.    Allergies  Allergen Reactions  . Penicillins     REACTION: unspecified  . Sulfamethoxazole     REACTION: as child     Review of Systems  patient denies chest pain, shortness of breath, orthopnea. Denies lower extremity edema, abdominal pain, change in appetite, change in bowel movements. Patient denies rashes, musculoskeletal complaints. No other specific complaints in a complete review of systems.      Objective:   Physical Exam  well-developed well-nourished male in no acute distress. HEENT exam  atraumatic, normocephalic, neck supple without jugular venous distention. Chest clear to auscultation cardiac exam S1-S2 are regular. Abdominal exam overweight with bowel sounds, soft and nontender. Extremities no edema. Neurologic exam is alert with a normal gait.  Rectal tone is somewhat decreased. Prostate minimally increased in size without any masses or asymmetry.        Assessment & Plan:

## 2010-09-17 NOTE — Assessment & Plan Note (Signed)
Long history of low vit d Now taking supplements Will check today

## 2010-09-17 NOTE — Assessment & Plan Note (Signed)
Check labs today.

## 2010-09-17 NOTE — Progress Notes (Signed)
Addended byAlfred Levins on: 09/17/2010 01:12 PM   Modules accepted: Orders

## 2010-09-17 NOTE — Assessment & Plan Note (Signed)
Minimal symptoms. We'll check PSA today.

## 2010-09-17 NOTE — Assessment & Plan Note (Addendum)
i will start monitoring DM- his endocrinologist is retiring

## 2010-09-17 NOTE — Assessment & Plan Note (Signed)
Adequate control. Continue current meds.  

## 2010-09-17 NOTE — Assessment & Plan Note (Addendum)
Yearly eye exam.

## 2010-09-17 NOTE — Progress Notes (Signed)
Addended by: Rita Ohara on: 09/17/2010 11:13 AM   Modules accepted: Orders

## 2010-09-18 LAB — VITAMIN D 25 HYDROXY (VIT D DEFICIENCY, FRACTURES): Vit D, 25-Hydroxy: 41 ng/mL (ref 30–89)

## 2010-10-01 ENCOUNTER — Ambulatory Visit: Payer: MEDICARE | Admitting: Internal Medicine

## 2010-10-01 DIAGNOSIS — Z0289 Encounter for other administrative examinations: Secondary | ICD-10-CM

## 2010-10-22 ENCOUNTER — Telehealth: Payer: Self-pay | Admitting: *Deleted

## 2010-10-22 DIAGNOSIS — F32A Depression, unspecified: Secondary | ICD-10-CM

## 2010-10-22 DIAGNOSIS — F329 Major depressive disorder, single episode, unspecified: Secondary | ICD-10-CM

## 2010-10-22 NOTE — Telephone Encounter (Signed)
Citalopram not working.  His wife takes Fluoxetine and she told him it works good.  He would like to know if he could try that.  [facility]

## 2010-10-24 MED ORDER — FLUOXETINE HCL 20 MG PO CAPS: 20.0000 mg | ORAL_CAPSULE | Freq: Every day | ORAL | Status: DC

## 2010-10-24 NOTE — Telephone Encounter (Signed)
Called in new rx, pt aware

## 2010-10-24 NOTE — Telephone Encounter (Signed)
Discontinue Cytoxan. Start fluoxetine 20 mg by mouth daily.

## 2010-10-28 ENCOUNTER — Ambulatory Visit (INDEPENDENT_AMBULATORY_CARE_PROVIDER_SITE_OTHER): Payer: MEDICARE | Admitting: Internal Medicine

## 2010-10-28 DIAGNOSIS — D649 Anemia, unspecified: Secondary | ICD-10-CM

## 2010-11-19 ENCOUNTER — Other Ambulatory Visit (INDEPENDENT_AMBULATORY_CARE_PROVIDER_SITE_OTHER): Payer: MEDICARE | Admitting: Internal Medicine

## 2010-11-19 DIAGNOSIS — N390 Urinary tract infection, site not specified: Secondary | ICD-10-CM

## 2010-11-20 LAB — VITAMIN D 25 HYDROXY (VIT D DEFICIENCY, FRACTURES): Vit D, 25-Hydroxy: 41 ng/mL (ref 30–89)

## 2010-11-21 LAB — URINE CULTURE: Colony Count: 2000

## 2010-11-28 NOTE — Procedures (Signed)
NAME:  [name], Frank [name] NO.:  [id number]   MEDICAL RECORD NO.:  [id number]          PATIENT TYPE:  OUT   LOCATION:  SLEEP CENTER                 FACILITY:  [facility]   PHYSICIAN:  Marcelyn Bruins, M.D. [facility] DATE OF BIRTH:  04-Mar-1941   DATE OF STUDY:  10/16/2004                              NOCTURNAL POLYSOMNOGRAM   DATE OF STUDY:  October 16, 2004   REFERRING PHYSICIAN:  Dr. Birdie Sons   INDICATION FOR THE STUDY:  Hypersomnia with sleep apnea.   SLEEP ARCHITECTURE:  The patient had a total sleep time 283 minutes with  very reduced REM and the patient never achieved slow wave sleep. Sleep onset  latency was very rapid at 3 minutes and REM onset was very prolonged at 265  minutes.   IMPRESSION:  1.  Moderate obstructive sleep apnea/hypopnea syndrome with a respiratory      disturbance index of 34 events per hour and O2 desaturation as low as      72%. Events were not positional. Moderate snoring was noted. By split-      night protocol attempts were made to place the patient on continuous      positive airway pressure however, the patient had great difficulty      tolerating any type of continuous positive airway pressure mask. He      complained of nasal congestion as well as mask discomfort. The study was      then continued as a routine NPSG. I would recommend initiation of      continuous positive airway pressure in the home environment with      deconditioning and possibly a sedative-hypnotic medication short-term to      help with continuous positive airway pressure tolerance. May also      consider weight loss and upper airway surgery as other alternatives.  2.  No clinically significant cardiac arrhythmias.      KC/MEDQ  D:  10/30/2004 14:11:02  T:  10/30/2004 20:00:38  Job:  [id number]

## 2010-11-28 NOTE — Op Note (Signed)
   NAME:  Frank [name], Frank [name] NO.:  [id number]   MEDICAL RECORD NO.:  [id number]                   PATIENT TYPE:  AMB   LOCATION:  DAY                                  FACILITY:  [facility]   PHYSICIAN:  Boston Service, M.D.             DATE OF BIRTH:  1941/07/04   DATE OF PROCEDURE:  11/17/2002  DATE OF DISCHARGE:                                 OPERATIVE REPORT   LMD:  Valetta Mole. Swords, M.D.   UROLOGIST:  Boston Service, M.D.   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 The risks and benefits  have been reviewed, the patient agrees to proceed with hydrocelectomy.   POSTOPERATIVE DIAGNOSES:  Same.   PROCEDURE:  Right and left hydrocelectomy.   ANESTHESIA:  General.   DRAINS:  None.   COMPLICATIONS:  None.   DESCRIPTION OF PROCEDURE:  The patient was prepped and draped in the supine  position after institution of an adequate level of general anesthesia. I&O  cath performed with a well lubricated 14 French red rubber catheter about  200 mL of clear urine were obtained. A transverse incision was made across  the anterior aspect of the left hemiscrotum, patient had been premedicated  with 80 mg of IV gentamycin and a g of Ancef. The incision was carried  through the skin and dartos, gentle pressure on the skin edges produced a  large, pearl gray hydrocele cavity. There was no evidence of communication  with spermatic cord. A longitudinal incision was made in the hydrocele  cavity, approximately 3-400 mL of straw colored fluid was returned. The cyst  cavity was sewn back on itself with interrupted sutures of 3-0 Chromic,  replaced in the left scrotum. The skin was closed in three layers, deepest  layer with running 3-0 Vicryl, middle layer with running 2-0 Chromic, skin  with interrupted sutures of 2-0 Chromic. A similar technique was used on the  left side, cord  block with 0.25% Marcaine without epinephrine was used in  both cases, wound was covered with dry gauze fluffs, ice bag and an athletic  supporter and the patient was returned to recovery in satisfactory  condition.                                               Boston Service, M.D.    RH/MEDQ  D:  11/17/2002  T:  11/17/2002  Job:  [id number]   cc:   Valetta Mole. Swords, M.D. [facility]

## 2010-11-28 NOTE — Op Note (Signed)
NAME:  Frank [name], Frank [name] NO.:  [id number]   MEDICAL RECORD NO.:  [id number]                   PATIENT TYPE:  INP   LOCATION:  5013                                 FACILITY:  [facility]   PHYSICIAN:  Elana Alm. Thurston Hole, M.D.              DATE OF BIRTH:  07/02/1941   DATE OF PROCEDURE:  DATE OF DISCHARGE:                                 OPERATIVE REPORT   PREOPERATIVE DIAGNOSIS:  Left proximal humeral shaft fracture.   POSTOPERATIVE DIAGNOSES:  Left proximal humeral shaft fracture.   PROCEDURE:  Open reduction with intramedullary rod fixation left humeral  shaft fracture using Ace-Depuy 24 cm x 9 mm interlocking rod.   SURGEON:  Dr. Elana Alm. Wainer.   ASSESSMENT:  Julien Girt, P.A.   ANESTHESIA:  General.   OPERATIVE TIME:  1 hour, 15 minutes.   COMPLICATIONS:  None.   DESCRIPTION OF PROCEDURE:  Frank [name] was brought to the operating room  on December 27, 2002 and placed on the operating table in supine position.  After an adequate level of general anesthesia was obtained, he was placed in  a beach-chair position.  He received vancomycin 1 gram IV preoperatively for  prophylaxis.  His left shoulder and arm was prepped using Betadine and  draped using sterile technique.   Initially, through a 3 cm longitudinal incision based over the rotator cuff  the underlying subcutaneous tissues were incised in line with the skin  incision.  The deltoid muscle and fashion was split longitudinally revealing  the underlying subacromial space for placement of the first guidewire from  the intramedullary rod.  The guidewire was placed under fluoroscopic control  down to the level of the fracture site.  However, the fracture could not be  reduced satisfactorily in a closed fashion, thus, I elected to perform a 3-4  cm longitudinal lateral incision over the fracture site.  This was performed  and then blunt dissection was carried down to the fracture site.  The  fracture was then manually reduced while the guide pin was placed across the  fracture site into the distal humeral shaft.  After this was done, then the  proximal step reamer was reamed over this.  At this point then sequential  axial reamers were used under fluoroscopic control reaming to a 10 mm size.  The guidewire was then measured for length and was found to be 24 cm in  length and then a 24 cm x 9 mm humeral rod was placed as the guidewire was  changed to a smaller one and then placed over the guidewire across the  fracture site and into the distal humeral shaft holding the fracture in a  reduced and anatomic position.  After this was done, then the proximal  aspect of the rod was interlocked using the proximal jig through a separate  1 cm incision.  Appropriate length screw was placed in this locked position.  At this point, then, a separate 3 cm incision was made anterior over the  distal interlocking position of the rod under fluoroscopic control and again  blunt dissection was carried down to the anterior humeral shaft and then a  drill bit was used under fluoroscopic control and this drill bit was drilled  through the humeral cortex and through the interlocking hole in the rod to  the distal cortex.  It was measured and the appropriate length interlocking  distal screw was placed with the humerus being held in a reduced and  anatomic position.  At this point AP and lateral fluoroscopic x-rays  confirmed anatomic reduction of the fracture and satisfactory position of  the hardware.  The wounds were all irrigated and then closed using #0 and 2-  0 Vicryl in the deltoid fascia and subcutaneous tissues and skin staples on  the skin.  Sterile dressings were applied.  The patient had a sling placed,  awakened, and taken to the Recovery Room in stable condition.   Needle and sponge counts correct x 2 at the end of the case.                                               Robert A.  Thurston Hole, M.D.    RAW/MEDQ  D:  12/28/2002  T:  12/29/2002  Job:  [id number]

## 2010-12-03 ENCOUNTER — Ambulatory Visit: Payer: MEDICARE | Admitting: Internal Medicine

## 2010-12-15 ENCOUNTER — Other Ambulatory Visit: Payer: Self-pay | Admitting: *Deleted

## 2010-12-15 DIAGNOSIS — I10 Essential (primary) hypertension: Secondary | ICD-10-CM

## 2010-12-15 MED ORDER — LISINOPRIL 5 MG PO TABS: 5.0000 mg | ORAL_TABLET | Freq: Every day | ORAL | Status: DC

## 2010-12-15 MED ORDER — GLIPIZIDE 10 MG PO TABS: 10.0000 mg | ORAL_TABLET | Freq: Every day | ORAL | Status: DC

## 2010-12-15 MED ORDER — METFORMIN HCL 1000 MG PO TABS: 1000.0000 mg | ORAL_TABLET | Freq: Two times a day (BID) | ORAL | Status: DC

## 2010-12-16 ENCOUNTER — Other Ambulatory Visit: Payer: Self-pay | Admitting: *Deleted

## 2010-12-16 MED ORDER — GLIPIZIDE ER 10 MG PO TB24: 10.0000 mg | ORAL_TABLET | Freq: Every day | ORAL | Status: DC

## 2010-12-17 ENCOUNTER — Ambulatory Visit (INDEPENDENT_AMBULATORY_CARE_PROVIDER_SITE_OTHER): Payer: Medicare Other | Admitting: Internal Medicine

## 2010-12-17 DIAGNOSIS — D649 Anemia, unspecified: Secondary | ICD-10-CM

## 2011-01-21 ENCOUNTER — Other Ambulatory Visit (INDEPENDENT_AMBULATORY_CARE_PROVIDER_SITE_OTHER): Payer: Medicare Other

## 2011-01-21 DIAGNOSIS — I10 Essential (primary) hypertension: Secondary | ICD-10-CM

## 2011-01-21 DIAGNOSIS — E119 Type 2 diabetes mellitus without complications: Secondary | ICD-10-CM

## 2011-01-21 DIAGNOSIS — D649 Anemia, unspecified: Secondary | ICD-10-CM

## 2011-01-21 LAB — HEPATIC FUNCTION PANEL
ALT: 26 U/L (ref 0–53)
AST: 23 U/L (ref 0–37)
Albumin: 4.2 g/dL (ref 3.5–5.2)
Alkaline Phosphatase: 56 U/L (ref 39–117)
Bilirubin, Direct: 0 mg/dL (ref 0.0–0.3)
Total Bilirubin: 0.6 mg/dL (ref 0.3–1.2)
Total Protein: 6.6 g/dL (ref 6.0–8.3)

## 2011-01-21 LAB — BASIC METABOLIC PANEL
BUN: 21 mg/dL (ref 6–23)
CO2: 33 mEq/L — ABNORMAL HIGH (ref 19–32)
Calcium: 9.1 mg/dL (ref 8.4–10.5)
Chloride: 104 mEq/L (ref 96–112)
Creatinine, Ser: 1 mg/dL (ref 0.4–1.5)
GFR: 79.83 mL/min (ref >60.00)
Glucose, Bld: 170 mg/dL — ABNORMAL HIGH (ref 70–99)
Potassium: 5.1 mEq/L (ref 3.5–5.1)
Sodium: 139 mEq/L (ref 135–145)

## 2011-01-21 LAB — LIPID PANEL
Cholesterol: 127 mg/dL (ref 0–200)
HDL: 65.5 mg/dL (ref >39.00)
LDL Cholesterol: 44 mg/dL (ref 0–99)
Total CHOL/HDL Ratio: 2
Triglycerides: 86 mg/dL (ref 0.0–149.0)
VLDL: 17.2 mg/dL (ref 0.0–40.0)

## 2011-01-21 LAB — HEMOGLOBIN A1C: Hgb A1c MFr Bld: 7.1 % — ABNORMAL HIGH (ref 4.6–6.5)

## 2011-01-28 ENCOUNTER — Ambulatory Visit: Payer: MEDICARE | Admitting: Internal Medicine

## 2011-02-04 ENCOUNTER — Ambulatory Visit: Payer: Self-pay | Admitting: Internal Medicine

## 2011-03-03 ENCOUNTER — Ambulatory Visit (INDEPENDENT_AMBULATORY_CARE_PROVIDER_SITE_OTHER): Payer: Medicare Other | Admitting: Internal Medicine

## 2011-03-03 ENCOUNTER — Encounter: Payer: Self-pay | Admitting: Internal Medicine

## 2011-03-03 DIAGNOSIS — E785 Hyperlipidemia, unspecified: Secondary | ICD-10-CM

## 2011-03-03 DIAGNOSIS — I10 Essential (primary) hypertension: Secondary | ICD-10-CM

## 2011-03-03 DIAGNOSIS — D649 Anemia, unspecified: Secondary | ICD-10-CM

## 2011-03-03 DIAGNOSIS — E119 Type 2 diabetes mellitus without complications: Secondary | ICD-10-CM

## 2011-03-03 NOTE — Patient Instructions (Signed)
Call your insurance about shingles shot

## 2011-03-03 NOTE — Assessment & Plan Note (Signed)
Fair control I would like him to keep a1c less than 7 with diet and exercise No need for more meds at this time

## 2011-03-03 NOTE — Assessment & Plan Note (Signed)
Controlled Continue same meds 

## 2011-03-03 NOTE — Progress Notes (Signed)
  Subjective:    Patient ID: Frank [name], male    DOB: 1940/09/02, 70 y.o.   MRN: [id number]  HPI   patient comes in for followup of multiple medical problems including type 2 diabetes, hyperlipidemia, hypertension. The patient does not check blood sugar or blood pressure at home. The patetient does not follow an exercise or diet program. The patient denies any polyuria, polydipsia.  In the past the patient has gone to diabetic treatment center. The patient is tolerating medications  Without difficulty. The patient does admit to medication compliance.   Past Medical History  Diagnosis Date  . Allergy     Rhinitis  . Diabetes mellitus     Type 2  . Hypertension   . 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 Anemia     NOS iron deficient and B12 deficient  . Hyperlipidemia   . OSA (obstructive sleep apnea)    Past Surgical History  Procedure Date  . Appendectomy   . Rotator cuff repair   . Nasal sinus surgery 1959    Deviated Septum  . 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 He does not have any smokeless tobacco history on file. He reports that he drinks alcohol. His drug history not on file. family history includes Allergies in his father; Breast cancer in his mother; Coronary artery disease in his brother; Heart disease in his mother; and Stroke in his father.  There is no history of Diabetes. Allergies  Allergen Reactions  . Penicillins     REACTION: unspecified  . Sulfamethoxazole     REACTION: as child     Review of Systems  patient denies chest pain, shortness of breath, orthopnea. Denies lower extremity edema, abdominal pain, change in appetite, change in bowel movements. Patient denies rashes, musculoskeletal complaints. No other specific complaints in a complete review of systems.      Objective:   Physical Exam  well-developed well-nourished male in no acute distress. HEENT exam atraumatic, normocephalic, neck supple  without jugular venous distention. Chest clear to auscultation cardiac exam S1-S2 are regular. Abdominal exam overweight with bowel sounds, soft and nontender. Extremities no edema. Neurologic exam is alert with a normal gait.     Assessment & Plan:

## 2011-03-03 NOTE — Assessment & Plan Note (Signed)
BP Readings from Last 3 Encounters:  03/03/11 144/84  09/17/10 112/58  02/05/10 110/52   Repeat bp 126/78 Continue same meds

## 2011-03-25 ENCOUNTER — Telehealth: Payer: Self-pay | Admitting: Internal Medicine

## 2011-03-25 NOTE — Telephone Encounter (Signed)
Opened in error

## 2011-04-01 ENCOUNTER — Ambulatory Visit (INDEPENDENT_AMBULATORY_CARE_PROVIDER_SITE_OTHER): Payer: Medicare Other | Admitting: Internal Medicine

## 2011-04-01 DIAGNOSIS — E538 Deficiency of other specified B group vitamins: Secondary | ICD-10-CM

## 2011-04-01 MED ORDER — CYANOCOBALAMIN 1000 MCG/ML IJ SOLN
1000.0000 ug | Freq: Once | INTRAMUSCULAR | Status: AC
  Administered 2011-04-01: 1000 ug via INTRAMUSCULAR

## 2011-04-23 ENCOUNTER — Other Ambulatory Visit: Payer: Self-pay | Admitting: Internal Medicine

## 2011-05-06 ENCOUNTER — Ambulatory Visit (INDEPENDENT_AMBULATORY_CARE_PROVIDER_SITE_OTHER): Payer: Medicare Other | Admitting: Internal Medicine

## 2011-05-06 DIAGNOSIS — D649 Anemia, unspecified: Secondary | ICD-10-CM

## 2011-05-06 DIAGNOSIS — Z23 Encounter for immunization: Secondary | ICD-10-CM

## 2011-06-08 ENCOUNTER — Other Ambulatory Visit: Payer: Self-pay | Admitting: *Deleted

## 2011-06-08 MED ORDER — ATORVASTATIN CALCIUM 20 MG PO TABS: 20.0000 mg | ORAL_TABLET | Freq: Every day | ORAL | Status: DC

## 2011-06-10 ENCOUNTER — Ambulatory Visit (INDEPENDENT_AMBULATORY_CARE_PROVIDER_SITE_OTHER): Payer: Medicare Other | Admitting: Internal Medicine

## 2011-06-10 DIAGNOSIS — D649 Anemia, unspecified: Secondary | ICD-10-CM

## 2011-06-10 MED ORDER — PIOGLITAZONE HCL 45 MG PO TABS: 45.0000 mg | ORAL_TABLET | Freq: Every day | ORAL | Status: DC

## 2011-06-13 ENCOUNTER — Other Ambulatory Visit: Payer: Self-pay | Admitting: Internal Medicine

## 2011-06-16 ENCOUNTER — Other Ambulatory Visit: Payer: Self-pay | Admitting: Internal Medicine

## 2011-06-19 ENCOUNTER — Telehealth: Payer: Self-pay | Admitting: *Deleted

## 2011-06-19 NOTE — Telephone Encounter (Signed)
Pt is taking Cardura 4 mg once daily and wants to know if he can take 2 tabs once daily

## 2011-06-19 NOTE — Telephone Encounter (Signed)
Should stay with current dose If having worsening urinary sxs it might be worth having him see urology

## 2011-06-19 NOTE — Telephone Encounter (Signed)
Pt aware.

## 2011-06-24 ENCOUNTER — Other Ambulatory Visit (INDEPENDENT_AMBULATORY_CARE_PROVIDER_SITE_OTHER): Payer: Medicare Other

## 2011-06-24 DIAGNOSIS — E119 Type 2 diabetes mellitus without complications: Secondary | ICD-10-CM

## 2011-06-24 LAB — BASIC METABOLIC PANEL
BUN: 24 mg/dL — ABNORMAL HIGH (ref 6–23)
CO2: 30 mEq/L (ref 19–32)
Calcium: 9 mg/dL (ref 8.4–10.5)
Chloride: 97 mEq/L (ref 96–112)
Creatinine, Ser: 1.1 mg/dL (ref 0.4–1.5)
GFR: 72.84 mL/min (ref >60.00)
Glucose, Bld: 155 mg/dL — ABNORMAL HIGH (ref 70–99)
Potassium: 4.6 mEq/L (ref 3.5–5.1)
Sodium: 134 mEq/L — ABNORMAL LOW (ref 135–145)

## 2011-06-24 LAB — HEPATIC FUNCTION PANEL
ALT: 28 U/L (ref 0–53)
AST: 26 U/L (ref 0–37)
Albumin: 3.8 g/dL (ref 3.5–5.2)
Alkaline Phosphatase: 54 U/L (ref 39–117)
Bilirubin, Direct: 0.1 mg/dL (ref 0.0–0.3)
Total Bilirubin: 0.6 mg/dL (ref 0.3–1.2)
Total Protein: 6.5 g/dL (ref 6.0–8.3)

## 2011-06-24 LAB — LIPID PANEL
Cholesterol: 115 mg/dL (ref 0–200)
HDL: 58.1 mg/dL (ref >39.00)
LDL Cholesterol: 45 mg/dL (ref 0–99)
Total CHOL/HDL Ratio: 2
Triglycerides: 60 mg/dL (ref 0.0–149.0)
VLDL: 12 mg/dL (ref 0.0–40.0)

## 2011-06-24 LAB — HEMOGLOBIN A1C: Hgb A1c MFr Bld: 7.1 % — ABNORMAL HIGH (ref 4.6–6.5)

## 2011-07-01 ENCOUNTER — Ambulatory Visit: Payer: Medicare Other | Admitting: Internal Medicine

## 2011-07-06 ENCOUNTER — Ambulatory Visit (INDEPENDENT_AMBULATORY_CARE_PROVIDER_SITE_OTHER): Payer: Medicare Other | Admitting: Internal Medicine

## 2011-07-06 DIAGNOSIS — D649 Anemia, unspecified: Secondary | ICD-10-CM

## 2011-07-22 ENCOUNTER — Ambulatory Visit: Payer: Medicare Other | Admitting: Internal Medicine

## 2011-08-12 ENCOUNTER — Ambulatory Visit (INDEPENDENT_AMBULATORY_CARE_PROVIDER_SITE_OTHER): Payer: Medicare Other | Admitting: Internal Medicine

## 2011-08-12 DIAGNOSIS — D649 Anemia, unspecified: Secondary | ICD-10-CM

## 2011-09-08 ENCOUNTER — Ambulatory Visit (INDEPENDENT_AMBULATORY_CARE_PROVIDER_SITE_OTHER): Payer: Medicare Other | Admitting: Internal Medicine

## 2011-09-08 DIAGNOSIS — D649 Anemia, unspecified: Secondary | ICD-10-CM

## 2011-09-09 ENCOUNTER — Ambulatory Visit: Payer: Medicare Other | Admitting: Internal Medicine

## 2011-09-11 ENCOUNTER — Ambulatory Visit: Payer: Medicare Other | Admitting: Internal Medicine

## 2011-10-05 ENCOUNTER — Other Ambulatory Visit (INDEPENDENT_AMBULATORY_CARE_PROVIDER_SITE_OTHER): Payer: Medicare Other

## 2011-10-05 DIAGNOSIS — N320 Bladder-neck obstruction: Secondary | ICD-10-CM

## 2011-10-05 DIAGNOSIS — Z0000 Encounter for general adult medical examination without abnormal findings: Secondary | ICD-10-CM

## 2011-10-05 DIAGNOSIS — E119 Type 2 diabetes mellitus without complications: Secondary | ICD-10-CM

## 2011-10-05 DIAGNOSIS — E785 Hyperlipidemia, unspecified: Secondary | ICD-10-CM

## 2011-10-05 LAB — CBC WITH DIFFERENTIAL/PLATELET
Basophils Absolute: 0 10*3/uL (ref 0.0–0.1)
Basophils Relative: 0.3 % (ref 0.0–3.0)
Eosinophils Absolute: 0.3 10*3/uL (ref 0.0–0.7)
Eosinophils Relative: 4.5 % (ref 0.0–5.0)
HCT: 36.7 % — ABNORMAL LOW (ref 39.0–52.0)
Hemoglobin: 11.9 g/dL — ABNORMAL LOW (ref 13.0–17.0)
Lymphocytes Relative: 21.5 % (ref 12.0–46.0)
Lymphs Abs: 1.2 10*3/uL (ref 0.7–4.0)
MCHC: 32.4 g/dL (ref 30.0–36.0)
MCV: 85.6 fl (ref 78.0–100.0)
Monocytes Absolute: 0.6 10*3/uL (ref 0.1–1.0)
Monocytes Relative: 10.5 % (ref 3.0–12.0)
Neutro Abs: 3.6 10*3/uL (ref 1.4–7.7)
Neutrophils Relative %: 63.2 % (ref 43.0–77.0)
Platelets: 157 10*3/uL (ref 150.0–400.0)
RBC: 4.29 Mil/uL (ref 4.22–5.81)
RDW: 14.2 % (ref 11.5–14.6)
WBC: 5.6 10*3/uL (ref 4.5–10.5)

## 2011-10-05 LAB — TSH: TSH: 1.28 u[IU]/mL (ref 0.35–5.50)

## 2011-10-05 LAB — HEPATIC FUNCTION PANEL
ALT: 26 U/L (ref 0–53)
AST: 22 U/L (ref 0–37)
Albumin: 3.7 g/dL (ref 3.5–5.2)
Alkaline Phosphatase: 60 U/L (ref 39–117)
Bilirubin, Direct: 0.1 mg/dL (ref 0.0–0.3)
Total Bilirubin: 0.5 mg/dL (ref 0.3–1.2)
Total Protein: 6 g/dL (ref 6.0–8.3)

## 2011-10-05 LAB — LIPID PANEL
Cholesterol: 123 mg/dL (ref 0–200)
HDL: 50.3 mg/dL (ref >39.00)
LDL Cholesterol: 54 mg/dL (ref 0–99)
Total CHOL/HDL Ratio: 2
Triglycerides: 94 mg/dL (ref 0.0–149.0)
VLDL: 18.8 mg/dL (ref 0.0–40.0)

## 2011-10-05 LAB — BASIC METABOLIC PANEL
BUN: 23 mg/dL (ref 6–23)
CO2: 29 mEq/L (ref 19–32)
Calcium: 8.8 mg/dL (ref 8.4–10.5)
Chloride: 100 mEq/L (ref 96–112)
Creatinine, Ser: 0.7 mg/dL (ref 0.4–1.5)
GFR: 110.18 mL/min (ref >60.00)
Glucose, Bld: 147 mg/dL — ABNORMAL HIGH (ref 70–99)
Potassium: 4.6 mEq/L (ref 3.5–5.1)
Sodium: 138 mEq/L (ref 135–145)

## 2011-10-05 LAB — MICROALBUMIN / CREATININE URINE RATIO
Creatinine,U: 184.9 mg/dL
Microalb Creat Ratio: 4 mg/g (ref 0.0–30.0)
Microalb, Ur: 7.4 mg/dL — ABNORMAL HIGH (ref 0.0–1.9)

## 2011-10-05 LAB — POCT URINALYSIS DIPSTICK
Bilirubin, UA: NEGATIVE
Blood, UA: NEGATIVE
Glucose, UA: NEGATIVE
Leukocytes, UA: NEGATIVE
Nitrite, UA: NEGATIVE
Spec Grav, UA: 1.025
Urobilinogen, UA: 0.2
pH, UA: 7

## 2011-10-05 LAB — PSA: PSA: 0.93 ng/mL (ref 0.10–4.00)

## 2011-10-05 LAB — HEMOGLOBIN A1C: Hgb A1c MFr Bld: 7.1 % — ABNORMAL HIGH (ref 4.6–6.5)

## 2011-10-13 ENCOUNTER — Telehealth: Payer: Self-pay | Admitting: Internal Medicine

## 2011-10-13 ENCOUNTER — Encounter: Payer: Self-pay | Admitting: Internal Medicine

## 2011-10-13 ENCOUNTER — Ambulatory Visit (INDEPENDENT_AMBULATORY_CARE_PROVIDER_SITE_OTHER): Payer: Medicare Other | Admitting: Internal Medicine

## 2011-10-13 VITALS — BP 130/70 | HR 80 | Temp 98.6°F | Ht 69.25 in | Wt 228.0 lb

## 2011-10-13 DIAGNOSIS — Z0000 Encounter for general adult medical examination without abnormal findings: Secondary | ICD-10-CM

## 2011-10-13 NOTE — Progress Notes (Signed)
Patient ID: Frank [name], male   DOB: [birth year], 71 y.o.   MRN: [id number]  CPX  Past Medical History  Diagnosis Date  . Allergy     Rhinitis  . Diabetes mellitus     Type 2  . Hypertension   . 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 Anemia     NOS iron deficient and B12 deficient  . Hyperlipidemia   . OSA (obstructive sleep apnea)     History   Social History  . Marital Status: Married    Spouse Name: N/A    Number of Children: N/A  . Years of Education: N/A   Occupational History  . Not on file.   Social History Main Topics  . Smoking status: Former Smoker -- 4.0 packs/day  . Smokeless tobacco: Not on file  . Alcohol Use: Yes  . Drug Use:   . Sexually Active:    Other Topics Concern  . Not on file   Social History Narrative  . No narrative on file    Past Surgical History  Procedure Date  . Appendectomy   . Rotator cuff repair   . Nasal sinus surgery 1959    Deviated Septum  . 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 Heart disease Mother   . Breast cancer Mother   . Stroke Father   . Allergies Father     Father and children  . Coronary artery disease Brother   . Diabetes Neg Hx     Allergies  Allergen Reactions  . Penicillins     REACTION: unspecified  . Sulfamethoxazole     REACTION: as child    Current Outpatient Prescriptions on File Prior to Visit  Medication Sig Dispense Refill  . aspirin 81 MG tablet Take 81 mg by mouth daily.        [name] atorvastatin (LIPITOR) 20 MG tablet Take 1 tablet (20 mg total) by mouth daily.  90 tablet  3  . calcium carbonate (OS-CAL) 600 MG TABS Take 600 mg by mouth daily.        . cyanocobalamin (,VITAMIN B-12,) 1000 MCG/ML injection Inject 1,000 mcg into the muscle once.        . doxazosin (CARDURA) 4 MG tablet TAKE ONE TABLET BY MOUTH ONCE A DAY.  30 tablet  6  . Ferrous Sulfate (IRON) 28 MG TABS Take 28 mg by mouth 2 (two) times daily.        [name]  FLUoxetine (PROZAC) 20 MG capsule TAKE ONE CAPSULE BY MOUTH EVERY DAY  30 capsule  5  . glipiZIDE (GLUCOTROL XL) 10 MG 24 hr tablet TAKE ONE TABLET BY MOUTH EVERY DAY  90 tablet  1  . lisinopril (PRINIVIL,ZESTRIL) 5 MG tablet TAKE ONE TABLET BY MOUTH EVERY DAY  90 tablet  1  . metFORMIN (GLUCOPHAGE) 1000 MG tablet TAKE ONE TABLET BY MOUTH TWICE DAILY  180 tablet  1  . Multiple Vitamin (MULTIVITAMIN) tablet Take 1 tablet by mouth daily.        . Pediatric Multiple Vit-Vit C (VITAMIN DAILY) LIQD Take 2 drops by mouth daily.        . pioglitazone (ACTOS) 45 MG tablet Take 1 tablet (45 mg total) by mouth daily.  90 tablet  3  . sitaGLIPtan (JANUVIA) 100 MG tablet Take 100 mg by mouth daily.         Current Facility-Administered Medications on  File Prior to Visit  Medication Dose Route Frequency Provider Last Rate Last Dose  . cyanocobalamin ((VITAMIN B-12)) injection 1,000 mcg  1,000 mcg Intramuscular Q30 days Lindley Magnus, MD   1,000 mcg at 09/08/11 1224     patient denies chest pain, shortness of breath, orthopnea. Denies lower extremity edema, abdominal pain, change in appetite, change in bowel movements. Patient denies rashes, musculoskeletal complaints. No other specific complaints in a complete review of systems.   BP 130/70  Pulse 80  Temp(Src) 98.6 F (37 C) (Oral)  Ht 5' 9.25" (1.759 m)  Wt 228 lb (103.42 kg)  BMI 33.43 kg/m2 Well-developed male in no acute distress. HEENT exam atraumatic, normocephalic, extraocular muscles are intact. Conjunctivae are pink without exudate. Neck is supple without lymphadenopathy, thyromegaly, jugular venous distention. Chest is clear to auscultation without increased work of breathing. Cardiac exam S1-S2 are regular. The PMI is normal. No significant murmurs or gallops. Abdominal exam active bowel sounds, soft, nontender. No abdominal bruits. Extremities no clubbing cyanosis or edema. Peripheral pulses are normal without bruits. Neurologic exam alert  and oriented without any motor or sensory deficits.  A/P Well visit: health maint UTD

## 2011-10-13 NOTE — Telephone Encounter (Signed)
Please contact patient regarding a no show fee he was charged for 07/22/11.  He stated he did not miss an appointment with Dr. Cato Mulligan - but his appointment was rescheduled to a later time.

## 2011-10-20 ENCOUNTER — Other Ambulatory Visit: Payer: Self-pay | Admitting: Internal Medicine

## 2011-11-18 ENCOUNTER — Ambulatory Visit (INDEPENDENT_AMBULATORY_CARE_PROVIDER_SITE_OTHER): Payer: Medicare Other | Admitting: Internal Medicine

## 2011-11-18 DIAGNOSIS — D649 Anemia, unspecified: Secondary | ICD-10-CM

## 2011-12-08 ENCOUNTER — Other Ambulatory Visit: Payer: Self-pay | Admitting: *Deleted

## 2011-12-08 MED ORDER — DOXAZOSIN MESYLATE 4 MG PO TABS: 4.0000 mg | ORAL_TABLET | Freq: Every day | ORAL | Status: DC

## 2011-12-08 MED ORDER — SITAGLIPTIN PHOSPHATE 100 MG PO TABS: 100.0000 mg | ORAL_TABLET | Freq: Every day | ORAL | Status: DC

## 2011-12-16 ENCOUNTER — Ambulatory Visit (INDEPENDENT_AMBULATORY_CARE_PROVIDER_SITE_OTHER): Payer: Medicare Other | Admitting: Internal Medicine

## 2011-12-16 DIAGNOSIS — D649 Anemia, unspecified: Secondary | ICD-10-CM

## 2011-12-21 ENCOUNTER — Other Ambulatory Visit: Payer: Self-pay | Admitting: Internal Medicine

## 2012-01-07 ENCOUNTER — Other Ambulatory Visit: Payer: Self-pay | Admitting: *Deleted

## 2012-01-07 MED ORDER — ATORVASTATIN CALCIUM 20 MG PO TABS: 20.0000 mg | ORAL_TABLET | Freq: Every day | ORAL | Status: DC

## 2012-01-07 MED ORDER — PIOGLITAZONE HCL 45 MG PO TABS: 45.0000 mg | ORAL_TABLET | Freq: Every day | ORAL | Status: DC

## 2012-01-07 NOTE — Addendum Note (Signed)
Addended by: Alfred Levins D on: 01/07/2012 03:55 PM   Modules accepted: Orders

## 2012-01-27 ENCOUNTER — Ambulatory Visit: Payer: Medicare Other | Admitting: Internal Medicine

## 2012-01-27 ENCOUNTER — Ambulatory Visit (INDEPENDENT_AMBULATORY_CARE_PROVIDER_SITE_OTHER): Payer: Medicare Other | Admitting: *Deleted

## 2012-01-27 DIAGNOSIS — D649 Anemia, unspecified: Secondary | ICD-10-CM

## 2012-01-27 MED ORDER — DESOXIMETASONE 0.25 % EX CREA: TOPICAL_CREAM | Freq: Two times a day (BID) | CUTANEOUS | Status: DC

## 2012-02-24 ENCOUNTER — Ambulatory Visit: Payer: Medicare Other | Admitting: Internal Medicine

## 2012-02-25 ENCOUNTER — Ambulatory Visit (INDEPENDENT_AMBULATORY_CARE_PROVIDER_SITE_OTHER): Payer: Medicare Other | Admitting: *Deleted

## 2012-02-25 DIAGNOSIS — D518 Other vitamin B12 deficiency anemias: Secondary | ICD-10-CM

## 2012-02-25 DIAGNOSIS — D519 Vitamin B12 deficiency anemia, unspecified: Secondary | ICD-10-CM

## 2012-02-25 MED ORDER — CYANOCOBALAMIN 1000 MCG/ML IJ SOLN
1000.0000 ug | INTRAMUSCULAR | Status: DC
  Administered 2012-02-25: 1000 ug via INTRAMUSCULAR

## 2012-03-13 LAB — HM DIABETES FOOT EXAM

## 2012-03-21 ENCOUNTER — Telehealth: Payer: Self-pay | Admitting: Internal Medicine

## 2012-03-21 NOTE — Telephone Encounter (Signed)
Caller: Frank [name]/Patient; Patient Name: Frank [name]; PCP: Birdie Sons (Adults only); Best Callback Phone Number: [phone]; Reason for call: Back Pain. Caller reports he spoke with nurse earlier re some intermittent back pain. He is requesting an appointment to see PCP. Onset of pain 2-3 days ago at intervals only. No known injury. No meds taken. Caller declined offer of Triage, asking to see PCP in the next couple of days. PCP not in office on Tuesday and no open slots on schedule for Wednesday 9/11. Caller advised of same and advised a note will be sent for PCP re him being worked in as requested. Advised to callback for Nursing assistance as needed. He is agreeable and can be reached at above number re an appointment for Wednesday.

## 2012-03-22 NOTE — Telephone Encounter (Signed)
Ok to see padonda

## 2012-03-22 NOTE — Telephone Encounter (Signed)
Pt scheduled  

## 2012-03-23 ENCOUNTER — Encounter: Payer: Self-pay | Admitting: Family

## 2012-03-23 ENCOUNTER — Ambulatory Visit: Payer: Medicare Other | Admitting: Internal Medicine

## 2012-03-23 ENCOUNTER — Ambulatory Visit (INDEPENDENT_AMBULATORY_CARE_PROVIDER_SITE_OTHER): Payer: Medicare Other | Admitting: Family

## 2012-03-23 VITALS — BP 150/60 | HR 87 | Temp 98.7°F | Wt 230.0 lb

## 2012-03-23 DIAGNOSIS — T148XXA Other injury of unspecified body region, initial encounter: Secondary | ICD-10-CM

## 2012-03-23 DIAGNOSIS — M545 Low back pain, unspecified: Secondary | ICD-10-CM

## 2012-03-23 DIAGNOSIS — Z23 Encounter for immunization: Secondary | ICD-10-CM

## 2012-03-23 DIAGNOSIS — D649 Anemia, unspecified: Secondary | ICD-10-CM

## 2012-03-23 LAB — POCT URINALYSIS DIPSTICK
Bilirubin, UA: NEGATIVE
Blood, UA: NEGATIVE
Glucose, UA: NEGATIVE
Ketones, UA: NEGATIVE
Leukocytes, UA: NEGATIVE
Nitrite, UA: NEGATIVE
Spec Grav, UA: 1.025
Urobilinogen, UA: 0.2
pH, UA: 6

## 2012-03-23 MED ORDER — CYANOCOBALAMIN 1000 MCG/ML IJ SOLN
1000.0000 ug | Freq: Once | INTRAMUSCULAR | Status: AC
  Administered 2012-03-23: 1000 ug via INTRAMUSCULAR

## 2012-03-23 NOTE — Patient Instructions (Addendum)
Muscle Strain A muscle strain, or pulled muscle, occurs when a muscle is over-stretched. A small number of muscle fibers may also be torn. This is especially common in athletes. This happens when a sudden violent force placed on a muscle pushes it past its capacity. Usually, recovery from a pulled muscle takes 1 to 2 weeks. But complete healing will take 5 to 6 weeks. There are millions of muscle fibers. Following injury, your body will usually return to normal quickly. HOME CARE INSTRUCTIONS   While awake, apply ice to the sore muscle for 15 to 20 minutes each hour for the first 2 days. Put ice in a plastic bag and place a towel between the bag of ice and your skin.   Do not use the pulled muscle for several days. Do not use the muscle if you have pain.   You may wrap the injured area with an elastic bandage for comfort. Be careful not to bind it too tightly. This may interfere with blood circulation.   Only take over-the-counter or prescription medicines for pain, discomfort, or fever as directed by your caregiver. Do not use aspirin as this will increase bleeding (bruising) at injury site.   Warming up before exercise helps prevent muscle strains.  SEEK MEDICAL CARE IF:  There is increased pain or swelling in the affected area. MAKE SURE YOU:   Understand these instructions.   Will watch your condition.   Will get help right away if you are not doing well or get worse.  Document Released: 06/29/2005 Document Revised: 06/18/2011 Document Reviewed: 01/26/2007 [employer] Patient Information 2012 [employer]. 

## 2012-03-23 NOTE — Progress Notes (Signed)
Subjective:    Patient ID: Frank [name], male    DOB: 11/21/1940, 71 y.o.   MRN: [id number]  HPI 71 year old white male, nonsmoker, patient of Dr. Cato Mulligan is in with complaints of right lower back pain x4 days. Patient sustained a fall down 2 steps while attempting to carry luggage down stairs. He recently and a 4/10 that is worse with movement. He describes it as intermittent he achy. He has not taken any medication for relief. Patient is basically here with concerns of an issue with his bladder or his kidneys because he has a history of type 2 diabetes. Denies any urinary frequency, urgency, burning with urination or blood in his urine. He is also here for a B12 shot.     Review of Systems  Constitutional: Negative.   Respiratory: Negative.   Cardiovascular: Negative.   Gastrointestinal: Negative.   Genitourinary: Negative.  Negative for urgency, frequency and hematuria.  Musculoskeletal: Positive for back pain. Negative for joint swelling.  Skin: Negative.   Hematological: Negative.   Psychiatric/Behavioral: Negative.    Past Medical History  Diagnosis Date  . Allergy     Rhinitis  . Diabetes mellitus     Type 2  . Hypertension   . 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 Anemia     NOS iron deficient and B12 deficient  . Hyperlipidemia   . OSA (obstructive sleep apnea)     History   Social History  . Marital Status: Married    Spouse Name: N/A    Number of Children: N/A  . Years of Education: N/A   Occupational History  . Not on file.   Social History Main Topics  . Smoking status: Former Smoker -- 1.0 packs/day    Quit date: 07/14/1983  . Smokeless tobacco: Not on file  . Alcohol Use: Yes  . Drug Use:   . Sexually Active:    Other Topics Concern  . Not on file   Social History Narrative  . No narrative on file    Past Surgical History  Procedure Date  . Appendectomy   . Rotator cuff repair   . Nasal sinus surgery 1959    Deviated  Septum  . 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 Heart disease Mother   . Breast cancer Mother   . Stroke Father   . Allergies Father     Father and children  . Coronary artery disease Brother   . Diabetes Neg Hx     Allergies  Allergen Reactions  . Penicillins     REACTION: unspecified  . Sulfamethoxazole     REACTION: as child    Current Outpatient Prescriptions on File Prior to Visit  Medication Sig Dispense Refill  . aspirin 81 MG tablet Take 81 mg by mouth daily.        [name] atorvastatin (LIPITOR) 20 MG tablet Take 1 tablet (20 mg total) by mouth daily.  90 tablet  3  . calcium carbonate (OS-CAL) 600 MG TABS Take 600 mg by mouth daily.        . cyanocobalamin (,VITAMIN B-12,) 1000 MCG/ML injection Inject 1,000 mcg into the muscle once.        . desoximetasone (TOPICORT) 0.25 % cream Apply topically 2 (two) times daily.  60 g  0  . doxazosin (CARDURA) 4 MG tablet Take 1 tablet (4 mg total) by mouth daily.  30 tablet  5  . Ferrous Sulfate (IRON) 28 MG TABS Take 28 mg by mouth 2 (two) times daily.        [name] FLUoxetine (PROZAC) 20 MG capsule TAKE ONE CAPSULE BY MOUTH EVERY DAY  30 capsule  5  . glipiZIDE (GLUCOTROL XL) 10 MG 24 hr tablet TAKE ONE TABLET BY MOUTH EVERY DAY  90 tablet  1  . lisinopril (PRINIVIL,ZESTRIL) 5 MG tablet TAKE ONE TABLET BY MOUTH EVERY DAY  90 tablet  1  . metFORMIN (GLUCOPHAGE) 1000 MG tablet TAKE ONE TABLET BY MOUTH TWICE DAILY  180 tablet  1  . Multiple Vitamin (MULTIVITAMIN) tablet Take 1 tablet by mouth daily.        . Pediatric Multiple Vit-Vit C (VITAMIN DAILY) LIQD Take 2 drops by mouth daily.        . pioglitazone (ACTOS) 45 MG tablet Take 1 tablet (45 mg total) by mouth daily.  90 tablet  3  . sitaGLIPtin (JANUVIA) 100 MG tablet Take 1 tablet (100 mg total) by mouth daily.  30 tablet  5   Current Facility-Administered Medications on File Prior to Visit  Medication Dose Route Frequency Provider  Last Rate Last Dose  . cyanocobalamin ((VITAMIN B-12)) injection 1,000 mcg  1,000 mcg Intramuscular Q30 days Lindley Magnus, MD   1,000 mcg at 01/27/12 1206  . cyanocobalamin ((VITAMIN B-12)) injection 1,000 mcg  1,000 mcg Intramuscular Q30 days Lindley Magnus, MD   1,000 mcg at 02/25/12 1014    BP 150/60  Pulse 87  Temp 98.7 F (37.1 C) (Oral)  Wt 230 lb (104.327 kg)  SpO2 95%chart    Objective:   Physical Exam  Constitutional: He is oriented to person, place, and time. He appears well-developed and well-nourished.  Neck: Normal range of motion. Neck supple.  Cardiovascular: Normal rate, regular rhythm and normal heart sounds.   Pulmonary/Chest: Effort normal and breath sounds normal.  Abdominal: Soft. Bowel sounds are normal.  Musculoskeletal: Normal range of motion.       Very mild tenderness to palpation of the back at the right lateral aspect of the lumbar spine. No evidence of bruising. Patient has a 90 flexion at the hip no pain. No pain with lateral movement of extension. Negative straight leg raise  Neurological: He is alert and oriented to person, place, and time.  Skin: Skin is warm and dry.  Psychiatric: He has a normal mood and affect.     Influenza vaccine administered  B12 administered      Assessment & Plan:  Assessment: Low back pain, type 2 diabetes, vitamin B12 deficiency  Plan: It a patient sustained off all ppears that the patient's symptoms are musculoskeletal and not related to his kidneys. Advised anti-inflammatory as needed and Flexeril 5 mg 3 times a day. Ice and heat to the affected area. Patient call the office if symptoms worsen or persist. Recheck as scheduled, and when necessary.

## 2012-04-06 ENCOUNTER — Other Ambulatory Visit (INDEPENDENT_AMBULATORY_CARE_PROVIDER_SITE_OTHER): Payer: Medicare Other

## 2012-04-06 DIAGNOSIS — Z79899 Other long term (current) drug therapy: Secondary | ICD-10-CM

## 2012-04-06 DIAGNOSIS — Z0000 Encounter for general adult medical examination without abnormal findings: Secondary | ICD-10-CM

## 2012-04-06 LAB — HEPATIC FUNCTION PANEL
ALT: 21 U/L (ref 0–53)
AST: 23 U/L (ref 0–37)
Albumin: 3.9 g/dL (ref 3.5–5.2)
Alkaline Phosphatase: 50 U/L (ref 39–117)
Bilirubin, Direct: 0.1 mg/dL (ref 0.0–0.3)
Total Bilirubin: 0.8 mg/dL (ref 0.3–1.2)
Total Protein: 6.6 g/dL (ref 6.0–8.3)

## 2012-04-06 LAB — BASIC METABOLIC PANEL
BUN: 23 mg/dL (ref 6–23)
CO2: 30 mEq/L (ref 19–32)
Calcium: 9.2 mg/dL (ref 8.4–10.5)
Chloride: 101 mEq/L (ref 96–112)
Creatinine, Ser: 1 mg/dL (ref 0.4–1.5)
GFR: 76.85 mL/min (ref >60.00)
Glucose, Bld: 119 mg/dL — ABNORMAL HIGH (ref 70–99)
Potassium: 5.2 mEq/L — ABNORMAL HIGH (ref 3.5–5.1)
Sodium: 139 mEq/L (ref 135–145)

## 2012-04-06 LAB — LIPID PANEL
Cholesterol: 116 mg/dL (ref 0–200)
HDL: 62.6 mg/dL (ref >39.00)
LDL Cholesterol: 40 mg/dL (ref 0–99)
Total CHOL/HDL Ratio: 2
Triglycerides: 65 mg/dL (ref 0.0–149.0)
VLDL: 13 mg/dL (ref 0.0–40.0)

## 2012-04-06 LAB — HEMOGLOBIN A1C: Hgb A1c MFr Bld: 6.3 % (ref 4.6–6.5)

## 2012-04-12 ENCOUNTER — Ambulatory Visit (INDEPENDENT_AMBULATORY_CARE_PROVIDER_SITE_OTHER): Payer: Medicare Other | Admitting: Internal Medicine

## 2012-04-12 ENCOUNTER — Ambulatory Visit: Payer: Medicare Other | Admitting: Internal Medicine

## 2012-04-12 VITALS — BP 124/66 | HR 72 | Temp 98.0°F | Wt 227.0 lb

## 2012-04-12 DIAGNOSIS — D649 Anemia, unspecified: Secondary | ICD-10-CM

## 2012-04-12 DIAGNOSIS — E785 Hyperlipidemia, unspecified: Secondary | ICD-10-CM

## 2012-04-12 DIAGNOSIS — I10 Essential (primary) hypertension: Secondary | ICD-10-CM

## 2012-04-12 DIAGNOSIS — E119 Type 2 diabetes mellitus without complications: Secondary | ICD-10-CM

## 2012-04-12 LAB — HM DIABETES EYE EXAM

## 2012-04-12 MED ORDER — CYANOCOBALAMIN 1000 MCG/ML IJ SOLN
1000.0000 ug | Freq: Once | INTRAMUSCULAR | Status: AC
  Administered 2012-04-12: 1000 ug via INTRAMUSCULAR

## 2012-04-12 NOTE — Progress Notes (Signed)
Patient ID: Frank [name], male   DOB: [birth year], 71 y.o.   MRN: [id number]   patient comes in for followup of multiple medical problems including type 2 diabetes, hyperlipidemia, hypertension. The patient does not check blood sugar or blood pressure at home. The patetient does not follow an exercise or diet program. The patient denies any polyuria, polydipsia.  In the past the patient has gone to diabetic treatment center. The patient is tolerating medications  Without difficulty. The patient does admit to medication compliance.    Past Medical History  Diagnosis Date  . Allergy     Rhinitis  . Diabetes mellitus     Type 2  . Hypertension   . 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 Anemia     NOS iron deficient and B12 deficient  . Hyperlipidemia   . OSA (obstructive sleep apnea)     History   Social History  . Marital Status: Married    Spouse Name: N/A    Number of Children: N/A  . Years of Education: N/A   Occupational History  . Not on file.   Social History Main Topics  . Smoking status: Former Smoker -- 1.0 packs/day    Quit date: 07/14/1983  . Smokeless tobacco: Not on file  . Alcohol Use: Yes  . Drug Use:   . Sexually Active:    Other Topics Concern  . Not on file   Social History Narrative  . No narrative on file    Past Surgical History  Procedure Date  . Appendectomy   . Rotator cuff repair   . Nasal sinus surgery 1959    Deviated Septum  . 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 Heart disease Mother   . Breast cancer Mother   . Stroke Father   . Allergies Father     Father and children  . Coronary artery disease Brother   . Diabetes Neg Hx     Allergies  Allergen Reactions  . Penicillins     REACTION: unspecified  . Sulfamethoxazole     REACTION: as child    Current Outpatient Prescriptions on File Prior to Visit  Medication Sig Dispense Refill  . aspirin 81 MG tablet  Take 81 mg by mouth daily.        [name] atorvastatin (LIPITOR) 20 MG tablet Take 1 tablet (20 mg total) by mouth daily.  90 tablet  3  . calcium carbonate (OS-CAL) 600 MG TABS Take 600 mg by mouth daily.        . cyanocobalamin (,VITAMIN B-12,) 1000 MCG/ML injection Inject 1,000 mcg into the muscle once.        . desoximetasone (TOPICORT) 0.25 % cream Apply topically 2 (two) times daily.  60 g  0  . doxazosin (CARDURA) 4 MG tablet Take 1 tablet (4 mg total) by mouth daily.  30 tablet  5  . Ferrous Sulfate (IRON) 28 MG TABS Take 28 mg by mouth 2 (two) times daily.        [name] FLUoxetine (PROZAC) 20 MG capsule TAKE ONE CAPSULE BY MOUTH EVERY DAY  30 capsule  5  . glipiZIDE (GLUCOTROL XL) 10 MG 24 hr tablet TAKE ONE TABLET BY MOUTH EVERY DAY  90 tablet  1  . lisinopril (PRINIVIL,ZESTRIL) 5 MG tablet TAKE ONE TABLET BY MOUTH EVERY DAY  90 tablet  1  . metFORMIN (GLUCOPHAGE) 1000 MG tablet  TAKE ONE TABLET BY MOUTH TWICE DAILY  180 tablet  1  . Multiple Vitamin (MULTIVITAMIN) tablet Take 1 tablet by mouth daily.        . Pediatric Multiple Vit-Vit C (VITAMIN DAILY) LIQD Take 2 drops by mouth daily.        . pioglitazone (ACTOS) 45 MG tablet Take 1 tablet (45 mg total) by mouth daily.  90 tablet  3  . sitaGLIPtin (JANUVIA) 100 MG tablet Take 1 tablet (100 mg total) by mouth daily.  30 tablet  5   Current Facility-Administered Medications on File Prior to Visit  Medication Dose Route Frequency Provider Last Rate Last Dose  . cyanocobalamin ((VITAMIN B-12)) injection 1,000 mcg  1,000 mcg Intramuscular Q30 days Lindley Magnus, MD   1,000 mcg at 01/27/12 1206  . cyanocobalamin ((VITAMIN B-12)) injection 1,000 mcg  1,000 mcg Intramuscular Q30 days Lindley Magnus, MD   1,000 mcg at 02/25/12 1014     patient denies chest pain, shortness of breath, orthopnea. Denies lower extremity edema, abdominal pain, change in appetite, change in bowel movements. Patient denies rashes, musculoskeletal complaints. No other  specific complaints in a complete review of systems.   BP 124/66  Pulse 72  Temp 98 F (36.7 C) (Oral)  Wt 227 lb (102.967 kg)   well-developed well-nourished male in no acute distress. HEENT exam atraumatic, normocephalic, neck supple without jugular venous distention. Chest clear to auscultation cardiac exam S1-S2 are regular. Abdominal exam overweight with bowel sounds, soft and nontender. Extremities no edema. Neurologic exam is alert with a normal gait.

## 2012-04-12 NOTE — Addendum Note (Signed)
Addended by: Alfred Levins D on: 04/12/2012 05:55 PM   Modules accepted: Orders

## 2012-04-12 NOTE — Assessment & Plan Note (Signed)
BP Readings from Last 3 Encounters:  04/12/12 124/66  03/23/12 150/60  10/13/11 130/70   Adequate control Continue same meds

## 2012-04-12 NOTE — Assessment & Plan Note (Signed)
Controlled Continue same meds 

## 2012-04-12 NOTE — Assessment & Plan Note (Signed)
Lab Results  Component Value Date   HGBA1C 6.3 04/06/2012   Controlled . Continue same meds Encouraged weight loss

## 2012-04-13 ENCOUNTER — Ambulatory Visit: Payer: Medicare Other | Admitting: Internal Medicine

## 2012-04-22 ENCOUNTER — Other Ambulatory Visit: Payer: Self-pay | Admitting: Internal Medicine

## 2012-05-18 ENCOUNTER — Ambulatory Visit: Payer: Medicare Other | Admitting: Internal Medicine

## 2012-05-18 DIAGNOSIS — Z0289 Encounter for other administrative examinations: Secondary | ICD-10-CM

## 2012-05-19 ENCOUNTER — Ambulatory Visit (INDEPENDENT_AMBULATORY_CARE_PROVIDER_SITE_OTHER): Payer: Medicare Other | Admitting: *Deleted

## 2012-05-19 DIAGNOSIS — E538 Deficiency of other specified B group vitamins: Secondary | ICD-10-CM

## 2012-05-19 MED ORDER — CYANOCOBALAMIN 1000 MCG/ML IJ SOLN
1000.0000 ug | Freq: Once | INTRAMUSCULAR | Status: AC
  Administered 2012-05-19: 1000 ug via INTRAMUSCULAR

## 2012-05-25 ENCOUNTER — Ambulatory Visit: Payer: Medicare Other | Admitting: *Deleted

## 2012-05-26 ENCOUNTER — Encounter: Payer: Self-pay | Admitting: Internal Medicine

## 2012-05-27 ENCOUNTER — Encounter: Payer: Self-pay | Admitting: Internal Medicine

## 2012-06-19 ENCOUNTER — Other Ambulatory Visit: Payer: Self-pay | Admitting: Internal Medicine

## 2012-06-23 ENCOUNTER — Ambulatory Visit (INDEPENDENT_AMBULATORY_CARE_PROVIDER_SITE_OTHER): Payer: Medicare Other | Admitting: *Deleted

## 2012-06-23 ENCOUNTER — Ambulatory Visit: Payer: Medicare Other | Admitting: Internal Medicine

## 2012-06-23 DIAGNOSIS — D649 Anemia, unspecified: Secondary | ICD-10-CM

## 2012-06-23 MED ORDER — CYANOCOBALAMIN 1000 MCG/ML IJ SOLN
1000.0000 ug | INTRAMUSCULAR | Status: DC
  Administered 2012-06-23: 1000 ug via INTRAMUSCULAR

## 2012-07-02 ENCOUNTER — Other Ambulatory Visit: Payer: Self-pay | Admitting: Internal Medicine

## 2012-07-27 ENCOUNTER — Ambulatory Visit (INDEPENDENT_AMBULATORY_CARE_PROVIDER_SITE_OTHER): Payer: Medicare Other | Admitting: Internal Medicine

## 2012-07-27 DIAGNOSIS — E538 Deficiency of other specified B group vitamins: Secondary | ICD-10-CM

## 2012-07-27 MED ORDER — CYANOCOBALAMIN 1000 MCG/ML IJ SOLN
1000.0000 ug | Freq: Once | INTRAMUSCULAR | Status: AC
  Administered 2012-07-27: 1000 ug via INTRAMUSCULAR

## 2012-09-14 ENCOUNTER — Ambulatory Visit (INDEPENDENT_AMBULATORY_CARE_PROVIDER_SITE_OTHER): Payer: Medicare Other | Admitting: *Deleted

## 2012-09-14 DIAGNOSIS — E538 Deficiency of other specified B group vitamins: Secondary | ICD-10-CM

## 2012-09-14 MED ORDER — CYANOCOBALAMIN 1000 MCG/ML IJ SOLN
1000.0000 ug | Freq: Once | INTRAMUSCULAR | Status: AC
  Administered 2012-09-14: 1000 ug via INTRAMUSCULAR

## 2012-09-23 ENCOUNTER — Other Ambulatory Visit: Payer: Self-pay | Admitting: Internal Medicine

## 2012-09-29 ENCOUNTER — Other Ambulatory Visit: Payer: Self-pay | Admitting: Internal Medicine

## 2012-10-05 ENCOUNTER — Other Ambulatory Visit (INDEPENDENT_AMBULATORY_CARE_PROVIDER_SITE_OTHER): Payer: Medicare Other

## 2012-10-05 DIAGNOSIS — E119 Type 2 diabetes mellitus without complications: Secondary | ICD-10-CM

## 2012-10-05 LAB — HEPATIC FUNCTION PANEL
ALT: 26 U/L (ref 0–53)
AST: 24 U/L (ref 0–37)
Albumin: 3.7 g/dL (ref 3.5–5.2)
Alkaline Phosphatase: 63 U/L (ref 39–117)
Bilirubin, Direct: 0.1 mg/dL (ref 0.0–0.3)
Total Bilirubin: 0.6 mg/dL (ref 0.3–1.2)
Total Protein: 6.7 g/dL (ref 6.0–8.3)

## 2012-10-05 LAB — BASIC METABOLIC PANEL
BUN: 22 mg/dL (ref 6–23)
CO2: 31 mEq/L (ref 19–32)
Calcium: 9.6 mg/dL (ref 8.4–10.5)
Chloride: 100 mEq/L (ref 96–112)
Creatinine, Ser: 1 mg/dL (ref 0.4–1.5)
GFR: 79.46 mL/min (ref >60.00)
Glucose, Bld: 169 mg/dL — ABNORMAL HIGH (ref 70–99)
Potassium: 5.5 mEq/L — ABNORMAL HIGH (ref 3.5–5.1)
Sodium: 138 mEq/L (ref 135–145)

## 2012-10-05 LAB — LIPID PANEL
Cholesterol: 118 mg/dL (ref 0–200)
HDL: 60.3 mg/dL (ref >39.00)
LDL Cholesterol: 45 mg/dL (ref 0–99)
Total CHOL/HDL Ratio: 2
Triglycerides: 65 mg/dL (ref 0.0–149.0)
VLDL: 13 mg/dL (ref 0.0–40.0)

## 2012-10-05 LAB — HEMOGLOBIN A1C: Hgb A1c MFr Bld: 6.6 % — ABNORMAL HIGH (ref 4.6–6.5)

## 2012-10-07 ENCOUNTER — Ambulatory Visit (INDEPENDENT_AMBULATORY_CARE_PROVIDER_SITE_OTHER): Payer: Medicare Other | Admitting: Family Medicine

## 2012-10-07 ENCOUNTER — Encounter: Payer: Self-pay | Admitting: Family Medicine

## 2012-10-07 VITALS — BP 130/80 | HR 83 | Temp 97.7°F | Wt 234.0 lb

## 2012-10-07 DIAGNOSIS — J209 Acute bronchitis, unspecified: Secondary | ICD-10-CM

## 2012-10-07 MED ORDER — AZITHROMYCIN 250 MG PO TABS: ORAL_TABLET | ORAL | Status: DC

## 2012-10-07 NOTE — Progress Notes (Signed)
  Subjective:    Patient ID: Frank [name], male    DOB: [birth year], 72 y.o.   MRN: [id number]  HPI Here for 4 days of chest congestion, aches, and coughing up green sputum. No fever.    Review of Systems  Constitutional: Negative.   HENT: Positive for congestion and postnasal drip.   Eyes: Negative.   Respiratory: Positive for cough.        Objective:   Physical Exam  Constitutional: He appears well-developed and well-nourished.  HENT:  Right Ear: External ear normal.  Left Ear: External ear normal.  Nose: Nose normal.  Mouth/Throat: Oropharynx is clear and moist.  Eyes: Conjunctivae are normal.  Pulmonary/Chest: No respiratory distress. He has no wheezes. He has no rales.  Scattered rhonchi   Lymphadenopathy:    He has no cervical adenopathy.          Assessment & Plan:  Add Mucinex and delsym

## 2012-10-11 ENCOUNTER — Ambulatory Visit: Payer: Medicare Other | Admitting: Internal Medicine

## 2012-10-12 ENCOUNTER — Encounter: Payer: Self-pay | Admitting: Internal Medicine

## 2012-10-12 ENCOUNTER — Ambulatory Visit (INDEPENDENT_AMBULATORY_CARE_PROVIDER_SITE_OTHER): Payer: Medicare Other | Admitting: Internal Medicine

## 2012-10-12 VITALS — BP 142/70 | HR 72 | Temp 97.8°F | Wt 231.0 lb

## 2012-10-12 DIAGNOSIS — E119 Type 2 diabetes mellitus without complications: Secondary | ICD-10-CM

## 2012-10-12 DIAGNOSIS — N320 Bladder-neck obstruction: Secondary | ICD-10-CM

## 2012-10-12 DIAGNOSIS — I10 Essential (primary) hypertension: Secondary | ICD-10-CM

## 2012-10-12 DIAGNOSIS — E785 Hyperlipidemia, unspecified: Secondary | ICD-10-CM

## 2012-10-12 MED ORDER — FINASTERIDE 5 MG PO TABS: 5.0000 mg | ORAL_TABLET | Freq: Every day | ORAL | Status: DC

## 2012-10-12 NOTE — Assessment & Plan Note (Signed)
Has sxs of urinary urgency Will add finasteride

## 2012-10-12 NOTE — Assessment & Plan Note (Signed)
a1c controlled Continue same meds

## 2012-10-12 NOTE — Assessment & Plan Note (Signed)
Fair control i'd like him to lose weight. He voices understanding

## 2012-10-12 NOTE — Assessment & Plan Note (Signed)
Lipid Panel     Component Value Date/Time   CHOL 118 10/05/2012 0817   TRIG 65.0 10/05/2012 0817   HDL 60.30 10/05/2012 0817   CHOLHDL 2 10/05/2012 0817   VLDL 13.0 10/05/2012 0817   LDLCALC 45 10/05/2012 0817    Controlled Continue same meds

## 2012-10-12 NOTE — Progress Notes (Signed)
Patient ID: Frank [name], male   DOB: 04-30-1941, 72 y.o.   MRN: [id number]   patient comes in for followup of multiple medical problems including type 2 diabetes, hyperlipidemia, hypertension. The patient does not check blood sugar or blood pressure at home. The patetient does not follow an exercise or diet program. The patient denies any polyuria, polydipsia.  In the past the patient has gone to diabetic treatment center. The patient is tolerating medications  Without difficulty. The patient does admit to medication compliance.  Note modest weight gain.  Past Medical History  Diagnosis Date  . Allergy     Rhinitis  . Diabetes mellitus     Type 2  . Hypertension   . 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 Anemia     NOS iron deficient and B12 deficient  . Hyperlipidemia   . OSA (obstructive sleep apnea)     History   Social History  . Marital Status: Married    Spouse Name: N/A    Number of Children: N/A  . Years of Education: N/A   Occupational History  . Not on file.   Social History Main Topics  . Smoking status: Former Smoker -- 1.00 packs/day    Quit date: 07/14/1983  . Smokeless tobacco: Not on file  . Alcohol Use: Yes  . Drug Use: Not on file  . Sexually Active: Not on file   Other Topics Concern  . Not on file   Social History Narrative  . No narrative on file    Past Surgical History  Procedure Laterality Date  . Appendectomy    . Rotator cuff repair    . Nasal sinus surgery  1959    Deviated Septum  . 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 Colonoscopy    . Esophagogastroduodenoscopy  2006    gastritis    Family History  Problem Relation Age of Onset  . Heart disease Mother   . Breast cancer Mother   . Stroke Father   . Allergies Father     Father and children  . Coronary artery disease Brother   . Diabetes Neg Hx     Allergies  Allergen Reactions  . Penicillins     REACTION: unspecified  . Sulfamethoxazole     REACTION:  as child    Current Outpatient Prescriptions on File Prior to Visit  Medication Sig Dispense Refill  . aspirin 81 MG tablet Take 81 mg by mouth daily.        [name] atorvastatin (LIPITOR) 20 MG tablet Take 1 tablet (20 mg total) by mouth daily.  90 tablet  3  . calcium carbonate (OS-CAL) 600 MG TABS Take 600 mg by mouth daily.        [name] desoximetasone (TOPICORT) 0.25 % cream Apply topically 2 (two) times daily.  60 g  0  . doxazosin (CARDURA) 4 MG tablet TAKE ONE TABLET BY MOUTH DAILY  30 tablet  4  . Ferrous Sulfate (IRON) 28 MG TABS Take 28 mg by mouth 2 (two) times daily.        [name] FLUoxetine (PROZAC) 20 MG capsule TAKE ONE CAPSULE BY MOUTH EVERY DAY  30 capsule  0  . glipiZIDE (GLUCOTROL XL) 10 MG 24 hr tablet TAKE ONE TABLET BY MOUTH EVERY DAY  90 tablet  0  . lisinopril (PRINIVIL,ZESTRIL) 5 MG tablet TAKE ONE TABLET BY MOUTH EVERY DAY  90 tablet  0  . loratadine-pseudoephedrine (CLARITIN-D 24-HOUR)  10-240 MG per 24 hr tablet Take 1 tablet by mouth daily.      . metFORMIN (GLUCOPHAGE) 1000 MG tablet TAKE ONE TABLET BY MOUTH TWICE DAILY  180 tablet  0  . Multiple Vitamin (MULTIVITAMIN) tablet Take 1 tablet by mouth daily.        . Pediatric Multiple Vit-Vit C (VITAMIN DAILY) LIQD Take 2 drops by mouth daily.        . pioglitazone (ACTOS) 45 MG tablet Take 1 tablet (45 mg total) by mouth daily.  90 tablet  3  . sitaGLIPtin (JANUVIA) 100 MG tablet Take 1 tablet (100 mg total) by mouth daily.  30 tablet  5   No current facility-administered medications on file prior to visit.     patient denies chest pain, shortness of breath, orthopnea. Denies lower extremity edema, abdominal pain, change in appetite, change in bowel movements. Patient denies rashes, musculoskeletal complaints. No other specific complaints in a complete review of systems.   BP 142/70  Pulse 72  Temp(Src) 97.8 F (36.6 C) (Oral)  Wt 231 lb (104.781 kg)  BMI 33.86 kg/m2   well-developed well-nourished male in no acute  distress. HEENT exam atraumatic, normocephalic, neck supple without jugular venous distention. Chest clear to auscultation cardiac exam S1-S2 are regular. Abdominal exam overweight with bowel sounds, soft and nontender. Extremities no edema. Neurologic exam is alert with a normal gait.

## 2012-10-19 ENCOUNTER — Ambulatory Visit (INDEPENDENT_AMBULATORY_CARE_PROVIDER_SITE_OTHER): Payer: Medicare Other | Admitting: Internal Medicine

## 2012-10-19 DIAGNOSIS — D649 Anemia, unspecified: Secondary | ICD-10-CM

## 2012-10-19 MED ORDER — CYANOCOBALAMIN 1000 MCG/ML IJ SOLN
1000.0000 ug | Freq: Once | INTRAMUSCULAR | Status: AC
  Administered 2012-10-19: 1000 ug via INTRAMUSCULAR

## 2012-11-11 ENCOUNTER — Other Ambulatory Visit: Payer: Self-pay | Admitting: Internal Medicine

## 2012-11-18 ENCOUNTER — Ambulatory Visit: Payer: Medicare Other | Admitting: Internal Medicine

## 2012-11-23 ENCOUNTER — Ambulatory Visit: Payer: Medicare Other | Admitting: *Deleted

## 2012-11-23 ENCOUNTER — Ambulatory Visit: Payer: Medicare Other | Admitting: Internal Medicine

## 2012-11-24 ENCOUNTER — Ambulatory Visit: Payer: Medicare Other | Admitting: *Deleted

## 2012-11-30 ENCOUNTER — Other Ambulatory Visit: Payer: Self-pay | Admitting: Internal Medicine

## 2012-12-05 ENCOUNTER — Other Ambulatory Visit: Payer: Self-pay | Admitting: Internal Medicine

## 2012-12-08 ENCOUNTER — Other Ambulatory Visit: Payer: Self-pay | Admitting: Internal Medicine

## 2012-12-12 ENCOUNTER — Other Ambulatory Visit: Payer: Self-pay | Admitting: Internal Medicine

## 2012-12-21 ENCOUNTER — Encounter: Payer: Self-pay | Admitting: Family

## 2012-12-21 ENCOUNTER — Ambulatory Visit (INDEPENDENT_AMBULATORY_CARE_PROVIDER_SITE_OTHER): Payer: Medicare Other | Admitting: Family

## 2012-12-21 VITALS — BP 120/58 | HR 108 | Wt 228.0 lb

## 2012-12-21 DIAGNOSIS — H109 Unspecified conjunctivitis: Secondary | ICD-10-CM

## 2012-12-21 DIAGNOSIS — J309 Allergic rhinitis, unspecified: Secondary | ICD-10-CM

## 2012-12-21 DIAGNOSIS — A499 Bacterial infection, unspecified: Secondary | ICD-10-CM

## 2012-12-21 DIAGNOSIS — H1089 Other conjunctivitis: Secondary | ICD-10-CM

## 2012-12-21 DIAGNOSIS — R339 Retention of urine, unspecified: Secondary | ICD-10-CM

## 2012-12-21 DIAGNOSIS — B9689 Other specified bacterial agents as the cause of diseases classified elsewhere: Secondary | ICD-10-CM

## 2012-12-21 MED ORDER — POLYMYXIN B-TRIMETHOPRIM 10000-0.1 UNIT/ML-% OP SOLN: 1.0000 [drp] | OPHTHALMIC | Status: DC

## 2012-12-21 NOTE — Progress Notes (Signed)
Subjective:    Patient ID: Frank [name], male    DOB: 1941/01/22, 72 y.o.   MRN: [id number]  HPI 72 year old white male, nonsmoker is in today with complaints of sneezing, cough, congestion has been ongoing x1 week. However, last night he began to have matting and crusting to the right eye. Has been taken Claritin-D over-the-counter and has not helped his symptoms. Take Claritin-D daily.   Review of Systems  Constitutional: Negative.   HENT: Positive for congestion and sneezing.   Eyes: Positive for discharge, redness and itching.  Respiratory: Negative.   Cardiovascular: Negative.   Genitourinary: Negative.   Skin: Negative.   Allergic/Immunologic: Positive for environmental allergies.  Neurological: Negative.   Psychiatric/Behavioral: Negative.    Past Medical History  Diagnosis Date  . Allergy     Rhinitis  . Diabetes mellitus     Type 2  . Hypertension   . 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 Anemia     NOS iron deficient and B12 deficient  . Hyperlipidemia   . OSA (obstructive sleep apnea)     History   Social History  . Marital Status: Married    Spouse Name: N/A    Number of Children: N/A  . Years of Education: N/A   Occupational History  . Not on file.   Social History Main Topics  . Smoking status: Former Smoker -- 1.00 packs/day    Quit date: 07/14/1983  . Smokeless tobacco: Not on file  . Alcohol Use: Yes  . Drug Use: Not on file  . Sexually Active: Not on file   Other Topics Concern  . Not on file   Social History Narrative  . No narrative on file    Past Surgical History  Procedure Laterality Date  . Appendectomy    . Rotator cuff repair    . Nasal sinus surgery  1959    Deviated Septum  . 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 Colonoscopy    . Esophagogastroduodenoscopy  2006    gastritis    Family History  Problem Relation Age of Onset  . Heart disease Mother   . Breast cancer Mother   . Stroke Father   .  Allergies Father     Father and children  . Coronary artery disease Brother   . Diabetes Neg Hx     Allergies  Allergen Reactions  . Penicillins     REACTION: unspecified  . Sulfamethoxazole     REACTION: as child    Current Outpatient Prescriptions on File Prior to Visit  Medication Sig Dispense Refill  . aspirin 81 MG tablet Take 81 mg by mouth daily.        [name] atorvastatin (LIPITOR) 20 MG tablet Take 1 tablet (20 mg total) by mouth daily.  90 tablet  3  . calcium carbonate (OS-CAL) 600 MG TABS Take 600 mg by mouth daily.        [name] desoximetasone (TOPICORT) 0.25 % cream APPLY TOPICALLY TWICE DAILY.  60 g  5  . doxazosin (CARDURA) 4 MG tablet TAKE ONE TABLET BY MOUTH EVERY DAY  30 tablet  5  . Ferrous Sulfate (IRON) 28 MG TABS Take 28 mg by mouth 2 (two) times daily.        . finasteride (PROSCAR) 5 MG tablet Take 1 tablet (5 mg total) by mouth daily.  30 tablet  11  . FLUoxetine (PROZAC) 20 MG capsule TAKE ONE CAPSULE BY MOUTH ONCE  DAILY  30 capsule  5  . glipiZIDE (GLUCOTROL XL) 10 MG 24 hr tablet TAKE ONE TABLET BY MOUTH EVERY DAY  90 tablet  0  . lisinopril (PRINIVIL,ZESTRIL) 5 MG tablet TAKE ONE TABLET BY MOUTH ONCE DAILY  90 tablet  0  . loratadine-pseudoephedrine (CLARITIN-D 24-HOUR) 10-240 MG per 24 hr tablet Take 1 tablet by mouth daily.      . metFORMIN (GLUCOPHAGE) 1000 MG tablet TAKE ONE TABLET BY MOUTH TWICE DAILY  180 tablet  0  . Multiple Vitamin (MULTIVITAMIN) tablet Take 1 tablet by mouth daily.        . Pediatric Multiple Vit-Vit C (VITAMIN DAILY) LIQD Take 2 drops by mouth daily.        . pioglitazone (ACTOS) 45 MG tablet Take 1 tablet (45 mg total) by mouth daily.  90 tablet  3  . sitaGLIPtin (JANUVIA) 100 MG tablet Take 1 tablet (100 mg total) by mouth daily.  30 tablet  5   No current facility-administered medications on file prior to visit.    BP 120/58  Pulse 108  Wt 228 lb (103.42 kg)  BMI 33.43 kg/m2  SpO2 95%chart    Objective:   Physical Exam   Constitutional: He is oriented to person, place, and time. He appears well-developed and well-nourished.  HENT:  Right Ear: External ear normal.  Nose: Nose normal.  Mouth/Throat: Oropharynx is clear and moist.  Eyes:  Right conjunctiva red and injected. Matting and crusting noted to the lower eyelid.  Neck: Normal range of motion.  Cardiovascular: Normal rate, regular rhythm and normal heart sounds.   Pulmonary/Chest: Effort normal and breath sounds normal.  Musculoskeletal: Normal range of motion.  Neurological: He is alert and oriented to person, place, and time.  Skin: Skin is warm and dry.  Psychiatric: He has a normal mood and affect.          Assessment & Plan:  Assessment:  1. Bacterial conjunctivitis 2. Allergic rhinitis 3. Urinary retention related to decongestants  Plan: Polytrim ophthalmic drops as directed. DC Claritin-D and start Zyrtec once daily. Avoid decongestants. Patient call the office if symptoms worsen or persist. Recheck as scheduled, and as needed.

## 2012-12-21 NOTE — Patient Instructions (Addendum)
1. Do not take any decongestants. Take Zyrtec 10 mg once daily. 2. Polytrim ophthalmic drops as discussed.  Conjunctivitis Conjunctivitis is commonly called "pink eye." Conjunctivitis can be caused by bacterial or viral infection, allergies, or injuries. There is usually redness of the lining of the eye, itching, discomfort, and sometimes discharge. There may be deposits of matter along the eyelids. A viral infection usually causes a watery discharge, while a bacterial infection causes a yellowish, thick discharge. Pink eye is very contagious and spreads by direct contact. You may be given antibiotic eyedrops as part of your treatment. Before using your eye medicine, remove all drainage from the eye by washing gently with warm water and cotton balls. Continue to use the medication until you have awakened 2 mornings in a row without discharge from the eye. Do not rub your eye. This increases the irritation and helps spread infection. Use separate towels from other household members. Wash your hands with soap and water before and after touching your eyes. Use cold compresses to reduce pain and sunglasses to relieve irritation from light. Do not wear contact lenses or wear eye makeup until the infection is gone. SEEK MEDICAL CARE IF:   Your symptoms are not better after 3 days of treatment.  You have increased pain or trouble seeing.  The outer eyelids become very red or swollen. Document Released: 08/06/2004 Document Revised: 09/21/2011 Document Reviewed: 06/29/2005 [facility] Patient Information 2014 [place], [place].

## 2012-12-23 ENCOUNTER — Other Ambulatory Visit: Payer: Self-pay | Admitting: Internal Medicine

## 2012-12-28 ENCOUNTER — Ambulatory Visit (INDEPENDENT_AMBULATORY_CARE_PROVIDER_SITE_OTHER): Payer: Medicare Other | Admitting: Internal Medicine

## 2012-12-28 DIAGNOSIS — D649 Anemia, unspecified: Secondary | ICD-10-CM

## 2012-12-28 MED ORDER — CYANOCOBALAMIN 1000 MCG/ML IJ SOLN
1000.0000 ug | Freq: Once | INTRAMUSCULAR | Status: AC
  Administered 2012-12-28: 1000 ug via INTRAMUSCULAR

## 2012-12-31 ENCOUNTER — Other Ambulatory Visit: Payer: Self-pay | Admitting: Internal Medicine

## 2013-01-25 ENCOUNTER — Encounter: Payer: Self-pay | Admitting: Internal Medicine

## 2013-03-01 ENCOUNTER — Ambulatory Visit (INDEPENDENT_AMBULATORY_CARE_PROVIDER_SITE_OTHER): Payer: Medicare Other | Admitting: Internal Medicine

## 2013-03-01 DIAGNOSIS — E538 Deficiency of other specified B group vitamins: Secondary | ICD-10-CM

## 2013-03-01 MED ORDER — CYANOCOBALAMIN 1000 MCG/ML IJ SOLN
1000.0000 ug | Freq: Once | INTRAMUSCULAR | Status: AC
  Administered 2013-03-01: 1000 ug via INTRAMUSCULAR

## 2013-03-17 ENCOUNTER — Other Ambulatory Visit: Payer: Self-pay | Admitting: Internal Medicine

## 2013-04-04 ENCOUNTER — Ambulatory Visit (INDEPENDENT_AMBULATORY_CARE_PROVIDER_SITE_OTHER): Payer: Medicare Other | Admitting: Internal Medicine

## 2013-04-04 DIAGNOSIS — Z23 Encounter for immunization: Secondary | ICD-10-CM

## 2013-04-04 DIAGNOSIS — E538 Deficiency of other specified B group vitamins: Secondary | ICD-10-CM

## 2013-04-04 MED ORDER — CYANOCOBALAMIN 1000 MCG/ML IJ SOLN
1000.0000 ug | Freq: Once | INTRAMUSCULAR | Status: AC
  Administered 2013-04-04: 1000 ug via INTRAMUSCULAR

## 2013-04-12 LAB — HM DIABETES EYE EXAM

## 2013-04-18 ENCOUNTER — Other Ambulatory Visit (INDEPENDENT_AMBULATORY_CARE_PROVIDER_SITE_OTHER): Payer: Medicare Other

## 2013-04-18 DIAGNOSIS — E119 Type 2 diabetes mellitus without complications: Secondary | ICD-10-CM

## 2013-04-18 LAB — LIPID PANEL
Cholesterol: 115 mg/dL (ref 0–200)
HDL: 60.4 mg/dL (ref >39.00)
LDL Cholesterol: 41 mg/dL (ref 0–99)
Total CHOL/HDL Ratio: 2
Triglycerides: 67 mg/dL (ref 0.0–149.0)
VLDL: 13.4 mg/dL (ref 0.0–40.0)

## 2013-04-18 LAB — HEMOGLOBIN A1C: Hgb A1c MFr Bld: 6.7 % — ABNORMAL HIGH (ref 4.6–6.5)

## 2013-04-18 LAB — HEPATIC FUNCTION PANEL
ALT: 20 U/L (ref 0–53)
AST: 19 U/L (ref 0–37)
Albumin: 3.6 g/dL (ref 3.5–5.2)
Alkaline Phosphatase: 42 U/L (ref 39–117)
Bilirubin, Direct: 0.1 mg/dL (ref 0.0–0.3)
Total Bilirubin: 0.6 mg/dL (ref 0.3–1.2)
Total Protein: 6.3 g/dL (ref 6.0–8.3)

## 2013-04-18 LAB — BASIC METABOLIC PANEL
BUN: 22 mg/dL (ref 6–23)
CO2: 31 mEq/L (ref 19–32)
Calcium: 8.8 mg/dL (ref 8.4–10.5)
Chloride: 103 mEq/L (ref 96–112)
Creatinine, Ser: 1 mg/dL (ref 0.4–1.5)
GFR: 82.24 mL/min (ref >60.00)
Glucose, Bld: 134 mg/dL — ABNORMAL HIGH (ref 70–99)
Potassium: 4.7 mEq/L (ref 3.5–5.1)
Sodium: 140 mEq/L (ref 135–145)

## 2013-04-19 ENCOUNTER — Other Ambulatory Visit: Payer: Medicare Other

## 2013-04-20 ENCOUNTER — Other Ambulatory Visit: Payer: Medicare Other

## 2013-04-25 ENCOUNTER — Encounter: Payer: Self-pay | Admitting: Internal Medicine

## 2013-04-25 ENCOUNTER — Ambulatory Visit (INDEPENDENT_AMBULATORY_CARE_PROVIDER_SITE_OTHER): Payer: Medicare Other | Admitting: Internal Medicine

## 2013-04-25 VITALS — BP 102/42 | HR 70 | Temp 98.3°F | Ht 69.5 in | Wt 233.0 lb

## 2013-04-25 DIAGNOSIS — E119 Type 2 diabetes mellitus without complications: Secondary | ICD-10-CM

## 2013-04-25 DIAGNOSIS — Z0000 Encounter for general adult medical examination without abnormal findings: Secondary | ICD-10-CM

## 2013-04-25 DIAGNOSIS — E785 Hyperlipidemia, unspecified: Secondary | ICD-10-CM

## 2013-04-25 DIAGNOSIS — I10 Essential (primary) hypertension: Secondary | ICD-10-CM

## 2013-04-25 DIAGNOSIS — E1159 Type 2 diabetes mellitus with other circulatory complications: Secondary | ICD-10-CM

## 2013-04-25 MED ORDER — ATORVASTATIN CALCIUM 20 MG PO TABS: 10.0000 mg | ORAL_TABLET | Freq: Every day | ORAL | Status: DC

## 2013-04-25 MED ORDER — DOXAZOSIN MESYLATE 4 MG PO TABS: 2.0000 mg | ORAL_TABLET | Freq: Every day | ORAL | Status: DC

## 2013-04-25 NOTE — Progress Notes (Signed)
patient comes in for followup of multiple medical problems including type 2 diabetes, hyperlipidemia, hypertension. The patient does not check blood sugar or blood pressure at home. The patetient does not follow an exercise or diet program. The patient denies any polyuria, polydipsia.  In the past the patient has gone to diabetic treatment center. The patient is tolerating medications  Without difficulty. The patient does admit to medication compliance.  Past Medical History  Diagnosis Date  . Allergy     Rhinitis  . Diabetes mellitus     Type 2  . Hypertension   . 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 Anemia     NOS iron deficient and B12 deficient  . Hyperlipidemia   . OSA (obstructive sleep apnea)     History   Social History  . Marital Status: Married    Spouse Name: N/A    Number of Children: N/A  . Years of Education: N/A   Occupational History  . Not on file.   Social History Main Topics  . Smoking status: Former Smoker -- 1.00 packs/day    Quit date: 07/14/1983  . Smokeless tobacco: Not on file  . Alcohol Use: Yes  . Drug Use: Not on file  . Sexual Activity: Not on file   Other Topics Concern  . Not on file   Social History Narrative  . No narrative on file    Past Surgical History  Procedure Laterality Date  . Appendectomy    . Rotator cuff repair    . Nasal sinus surgery  1959    Deviated Septum  . 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 Colonoscopy    . Esophagogastroduodenoscopy  2006    gastritis    Family History  Problem Relation Age of Onset  . Heart disease Mother   . Breast cancer Mother   . Stroke Father   . Allergies Father     Father and children  . Coronary artery disease Brother   . Diabetes Neg Hx     Allergies  Allergen Reactions  . Penicillins     REACTION: unspecified  . Sulfamethoxazole     REACTION: as child    Current Outpatient Prescriptions on File Prior to Visit  Medication Sig Dispense Refill  . aspirin  81 MG tablet Take 81 mg by mouth daily.        . calcium carbonate (OS-CAL) 600 MG TABS Take 600 mg by mouth daily.        [name] desoximetasone (TOPICORT) 0.25 % cream APPLY TOPICALLY TWICE DAILY.  60 g  5  . doxazosin (CARDURA) 4 MG tablet TAKE ONE TABLET BY MOUTH EVERY DAY  30 tablet  5  . Ferrous Sulfate (IRON) 28 MG TABS Take 28 mg by mouth 2 (two) times daily.        [name] FLUoxetine (PROZAC) 20 MG capsule TAKE ONE CAPSULE BY MOUTH ONCE DAILY  30 capsule  5  . glipiZIDE (GLUCOTROL XL) 10 MG 24 hr tablet TAKE ONE TABLET BY MOUTH ONCE DAILY  90 tablet  1  . lisinopril (PRINIVIL,ZESTRIL) 5 MG tablet TAKE ONE TABLET BY MOUTH ONCE DAILY  90 tablet  0  . metFORMIN (GLUCOPHAGE) 1000 MG tablet TAKE ONE TABLET BY MOUTH TWICE DAILY  180 tablet  0  . Multiple Vitamin (MULTIVITAMIN) tablet Take 1 tablet by mouth daily.        . Pediatric Multiple Vit-Vit C (VITAMIN DAILY) LIQD Take 2 drops by  mouth daily.        . pioglitazone (ACTOS) 45 MG tablet TAKE 1 TABLET (45 MG TOTAL) BY MOUTH DAILY.  90 tablet  3  . sitaGLIPtin (JANUVIA) 100 MG tablet Take 1 tablet (100 mg total) by mouth daily.  30 tablet  5   No current facility-administered medications on file prior to visit.     patient denies chest pain, shortness of breath, orthopnea. Denies lower extremity edema, abdominal pain, change in appetite, change in bowel movements. Patient denies rashes, musculoskeletal complaints. No other specific complaints in a complete review of systems.   BP 102/42  Pulse 70  Temp(Src) 98.3 F (36.8 C) (Oral)  Ht 5' 9.5" (1.765 m)  Wt 233 lb (105.688 kg)  BMI 33.93 kg/m2  well-developed well-nourished male in no acute distress. HEENT exam atraumatic, normocephalic, neck supple without jugular venous distention. Chest clear to auscultation cardiac exam S1-S2 are regular. Abdominal exam overweight with bowel sounds, soft and nontender. Extremities no edema. Neurologic exam is alert with a normal gait.

## 2013-04-26 ENCOUNTER — Ambulatory Visit: Payer: Medicare Other | Admitting: Internal Medicine

## 2013-04-27 NOTE — Assessment & Plan Note (Signed)
BP Readings from Last 3 Encounters:  04/25/13 102/42  12/21/12 120/58  10/12/12 142/70   Blood pressure is on the low side. Will decrease doxazosin to 2 mg by mouth daily.

## 2013-04-27 NOTE — Assessment & Plan Note (Signed)
Lipid Panel     Component Value Date/Time   CHOL 115 04/18/2013 0824   TRIG 67.0 04/18/2013 0824   HDL 60.40 04/18/2013 0824   CHOLHDL 2 04/18/2013 0824   VLDL 13.4 04/18/2013 0824   LDLCALC 41 04/18/2013 0824   Well controlled. Discussed medications. Will decrease atorvastatin 10 mg by mouth daily.

## 2013-04-27 NOTE — Assessment & Plan Note (Signed)
Lab Results  Component Value Date   HGBA1C 6.7* 04/18/2013   Well-controlled. Advised aggressive weight loss. I think with weight loss he could decrease or discontinue many of his diabetic medications.

## 2013-05-09 ENCOUNTER — Ambulatory Visit (INDEPENDENT_AMBULATORY_CARE_PROVIDER_SITE_OTHER): Payer: Medicare Other | Admitting: *Deleted

## 2013-05-09 DIAGNOSIS — E538 Deficiency of other specified B group vitamins: Secondary | ICD-10-CM

## 2013-05-09 MED ORDER — CYANOCOBALAMIN 1000 MCG/ML IJ SOLN
1000.0000 ug | Freq: Once | INTRAMUSCULAR | Status: AC
  Administered 2013-05-09: 1000 ug via INTRAMUSCULAR

## 2013-05-31 ENCOUNTER — Other Ambulatory Visit: Payer: Self-pay | Admitting: Internal Medicine

## 2013-06-09 ENCOUNTER — Ambulatory Visit (INDEPENDENT_AMBULATORY_CARE_PROVIDER_SITE_OTHER): Payer: Medicare Other | Admitting: Internal Medicine

## 2013-06-09 DIAGNOSIS — E559 Vitamin D deficiency, unspecified: Secondary | ICD-10-CM

## 2013-06-09 DIAGNOSIS — E538 Deficiency of other specified B group vitamins: Secondary | ICD-10-CM

## 2013-06-09 MED ORDER — CYANOCOBALAMIN 1000 MCG/ML IJ SOLN
1000.0000 ug | Freq: Once | INTRAMUSCULAR | Status: AC
  Administered 2013-06-09: 1000 ug via INTRAMUSCULAR

## 2013-06-11 ENCOUNTER — Other Ambulatory Visit: Payer: Self-pay | Admitting: Internal Medicine

## 2013-06-14 ENCOUNTER — Encounter: Payer: Self-pay | Admitting: Internal Medicine

## 2013-07-12 ENCOUNTER — Other Ambulatory Visit: Payer: Self-pay | Admitting: Internal Medicine

## 2013-07-13 LAB — HM DIABETES EYE EXAM

## 2013-07-19 ENCOUNTER — Ambulatory Visit (INDEPENDENT_AMBULATORY_CARE_PROVIDER_SITE_OTHER): Payer: Medicare Other | Admitting: *Deleted

## 2013-07-19 DIAGNOSIS — E538 Deficiency of other specified B group vitamins: Secondary | ICD-10-CM

## 2013-07-19 DIAGNOSIS — R32 Unspecified urinary incontinence: Secondary | ICD-10-CM

## 2013-07-19 LAB — POCT URINALYSIS DIPSTICK
Bilirubin, UA: NEGATIVE
Blood, UA: NEGATIVE
Glucose, UA: NEGATIVE
Ketones, UA: NEGATIVE
Nitrite, UA: NEGATIVE
Spec Grav, UA: 1.02
Urobilinogen, UA: 0.2
pH, UA: 6.5

## 2013-07-19 MED ORDER — CYANOCOBALAMIN 1000 MCG/ML IJ SOLN
1000.0000 ug | Freq: Once | INTRAMUSCULAR | Status: AC
  Administered 2013-07-19: 1000 ug via INTRAMUSCULAR

## 2013-07-20 NOTE — Addendum Note (Signed)
Addended by: Townsend Roger D on: 07/20/2013 10:25 AM   Modules accepted: Orders

## 2013-07-22 LAB — URINE CULTURE: Colony Count: 9000

## 2013-07-24 ENCOUNTER — Encounter: Payer: Self-pay | Admitting: Internal Medicine

## 2013-08-16 ENCOUNTER — Ambulatory Visit (AMBULATORY_SURGERY_CENTER): Payer: Self-pay | Admitting: *Deleted

## 2013-08-16 VITALS — Ht 70.0 in | Wt 238.0 lb

## 2013-08-16 DIAGNOSIS — Z1211 Encounter for screening for malignant neoplasm of colon: Secondary | ICD-10-CM

## 2013-08-16 MED ORDER — NA SULFATE-K SULFATE-MG SULF 17.5-3.13-1.6 GM/177ML PO SOLN: 1.0000 | Freq: Once | ORAL | Status: DC

## 2013-08-16 NOTE — Progress Notes (Signed)
No allergies to eggs or soy. No problems with anesthesia.  

## 2013-08-17 ENCOUNTER — Encounter: Payer: Self-pay | Admitting: Internal Medicine

## 2013-08-18 ENCOUNTER — Ambulatory Visit: Payer: Medicare Other | Admitting: Internal Medicine

## 2013-08-30 ENCOUNTER — Encounter: Payer: Self-pay | Admitting: Internal Medicine

## 2013-08-30 ENCOUNTER — Ambulatory Visit (AMBULATORY_SURGERY_CENTER): Payer: Medicare Other | Admitting: Internal Medicine

## 2013-08-30 VITALS — BP 152/82 | HR 74 | Temp 96.7°F | Resp 15 | Ht 70.0 in | Wt 238.0 lb

## 2013-08-30 DIAGNOSIS — K5730 Diverticulosis of large intestine without perforation or abscess without bleeding: Secondary | ICD-10-CM

## 2013-08-30 DIAGNOSIS — Z1211 Encounter for screening for malignant neoplasm of colon: Secondary | ICD-10-CM

## 2013-08-30 LAB — GLUCOSE, CAPILLARY
Glucose-Capillary: 139 mg/dL — ABNORMAL HIGH (ref 70–99)
Glucose-Capillary: 139 mg/dL — ABNORMAL HIGH (ref 70–99)

## 2013-08-30 MED ORDER — SODIUM CHLORIDE 0.9 % IV SOLN: 500.0000 mL | INTRAVENOUS | Status: DC

## 2013-08-30 NOTE — Patient Instructions (Addendum)
No polyps today! Again. You do have a condition called diverticulosis - common and not usually a problem. Please read the handout provided.  You do not need further colon cancer screening.  I appreciate the opportunity to care for you. Gatha Mayer, MD, FACG   YOU HAD AN ENDOSCOPIC PROCEDURE TODAY AT [facility]: Refer to the procedure report that was given to you for any specific questions about what was found during the examination.  If the procedure report does not answer your questions, please call your gastroenterologist to clarify.  If you requested that your care partner not be given the details of your procedure findings, then the procedure report has been included in a sealed envelope for you to review at your convenience later.  YOU SHOULD EXPECT: Some feelings of bloating in the abdomen. Passage of more gas than usual.  Walking can help get rid of the air that was put into your GI tract during the procedure and reduce the bloating. If you had a lower endoscopy (such as a colonoscopy or flexible sigmoidoscopy) you may notice spotting of blood in your stool or on the toilet paper. If you underwent a bowel prep for your procedure, then you may not have a normal bowel movement for a few days.  DIET: Your first meal following the procedure should be a light meal and then it is ok to progress to your normal diet.  A half-sandwich or bowl of soup is an example of a good first meal.  Heavy or fried foods are harder to digest and may make you feel nauseous or bloated.  Likewise meals heavy in dairy and vegetables can cause extra gas to form and this can also increase the bloating.  Drink plenty of fluids but you should avoid alcoholic beverages for 24 hours.  ACTIVITY: Your care partner should take you home directly after the procedure.  You should plan to take it easy, moving slowly for the rest of the day.  You can resume normal activity the day after the procedure however you  should NOT DRIVE or use heavy machinery for 24 hours (because of the sedation medicines used during the test).    SYMPTOMS TO REPORT IMMEDIATELY: A gastroenterologist can be reached at any hour.  During normal business hours, 8:30 AM to 5:00 PM Monday through Friday, call [phone].  After hours and on weekends, please call the GI answering service at [phone] who will take a message and have the physician on call contact you.   Following lower endoscopy (colonoscopy or flexible sigmoidoscopy):  Excessive amounts of blood in the stool  Significant tenderness or worsening of abdominal pains  Swelling of the abdomen that is new, acute  Fever of 100F or higher    FOLLOW UP: If any biopsies were taken you will be contacted by phone or by letter within the next 1-3 weeks.  Call your gastroenterologist if you have not heard about the biopsies in 3 weeks.  Our staff will call the home number listed on your records the next business day following your procedure to check on you and address any questions or concerns that you may have at that time regarding the information given to you following your procedure. This is a courtesy call and so if there is no answer at the home number and we have not heard from you through the emergency physician on call, we will assume that you have returned to your regular daily activities without incident.  SIGNATURES/CONFIDENTIALITY: You and/or your care partner have signed paperwork which will be entered into your electronic medical record.  These signatures attest to the fact that that the information above on your After Visit Summary has been reviewed and is understood.  Full responsibility of the confidentiality of this discharge information lies with you and/or your care-partner.    INFORMATION ON DIVERTICULOSIS AND HIGH FIBER DIET GIVEN TO YOU TODAY

## 2013-08-30 NOTE — Op Note (Signed)
[place]  [address] [place], [address]   COLONOSCOPY PROCEDURE REPORT  PATIENT: Cato, Liburd  MR#: [id number] BIRTHDATE: Apr 12, 1941 , 60  yrs. old GENDER: Male ENDOSCOPIST: Gatha Mayer, MD, [facility] PROCEDURE DATE:  08/30/2013 PROCEDURE:   Colonoscopy, screening First Screening Colonoscopy - Avg.  risk and is 50 yrs.  old or older - No.  Prior Negative Screening - Now for repeat screening. 10 or more years since last screening  History of Adenoma - Now for follow-up colonoscopy & has been > or = to 3 yrs.  N/A  Polyps Removed Today? No.  Recommend repeat exam, <10 yrs? No. ASA CLASS:   Class III INDICATIONS:average risk screening and Last colonoscopy performed 2003. MEDICATIONS: propofol (Diprivan) 300mg  IV, MAC sedation, administered by CRNA, and These medications were titrated to patient response per physician's verbal order  DESCRIPTION OF PROCEDURE:   After the risks benefits and alternatives of the procedure were thoroughly explained, informed consent was obtained.  A digital rectal exam revealed no abnormalities of the rectum, A digital rectal exam revealed the prostate was not enlarged, and A digital rectal exam revealed no prostatic nodules.   The LB [id number]  endoscope was introduced through the anus and advanced to the cecum, which was identified by both the appendix and ileocecal valve. No adverse events experienced.   The quality of the prep was Suprep adequate The instrument was then slowly withdrawn as the colon was fully examined.      COLON FINDINGS: Moderate diverticulosis was noted in the sigmoid colon.   The colon mucosa was otherwise normal.  Retroflexed views revealed no abnormalities. The time to cecum=3 minutes 41 seconds. Withdrawal time=13 minutes 34 seconds.  The scope was withdrawn and the procedure completed. COMPLICATIONS: There were no complications.  ENDOSCOPIC IMPRESSION: 1.   Moderate  diverticulosis was noted in the sigmoid colon 2.   The colon mucosa was otherwise normal - adequate prep  RECOMMENDATIONS: Follow-up as needed - he is 74 and has had 2 negative screening colonoscopies so appropriate to stop CRCA screening   eSigned:  Gatha Mayer, MD, [facility] 08/30/2013 9:39 AM   cc: Lisabeth Pick, MD and The Patient

## 2013-08-31 ENCOUNTER — Telehealth: Payer: Self-pay | Admitting: *Deleted

## 2013-08-31 NOTE — Telephone Encounter (Signed)
  Follow up Call-  Call back number 08/30/2013  Post procedure Call Back phone  # [phone] or cell [phone]  Permission to leave phone message Yes     Patient questions:  Do you have a fever, pain , or abdominal swelling? no Pain Score  0 *  Have you tolerated food without any problems? no  Have you been able to return to your normal activities? yes  Do you have any questions about your discharge instructions: Diet   no Medications  no Follow up visit  no  Do you have questions or concerns about your Care? no  Actions: * If pain score is 4 or above: No action needed, pain <4. Spoke with wife, patient without problems with food.

## 2013-09-05 ENCOUNTER — Ambulatory Visit (INDEPENDENT_AMBULATORY_CARE_PROVIDER_SITE_OTHER): Payer: Medicare Other | Admitting: Internal Medicine

## 2013-09-05 DIAGNOSIS — E538 Deficiency of other specified B group vitamins: Secondary | ICD-10-CM

## 2013-09-05 MED ORDER — CYANOCOBALAMIN 1000 MCG/ML IJ SOLN
1000.0000 ug | Freq: Once | INTRAMUSCULAR | Status: AC
  Administered 2013-09-05: 1000 ug via INTRAMUSCULAR

## 2013-09-29 ENCOUNTER — Ambulatory Visit (INDEPENDENT_AMBULATORY_CARE_PROVIDER_SITE_OTHER): Payer: Medicare Other | Admitting: Family Medicine

## 2013-09-29 ENCOUNTER — Encounter: Payer: Self-pay | Admitting: Family Medicine

## 2013-09-29 VITALS — BP 101/68 | HR 68 | Temp 98.4°F | Wt 242.0 lb

## 2013-09-29 DIAGNOSIS — J309 Allergic rhinitis, unspecified: Secondary | ICD-10-CM

## 2013-09-29 NOTE — Progress Notes (Signed)
Chief Complaint  Patient presents with  . Cough    sneezing, congestion     HPI:  -started: last week -symptoms:nasal congestion, sneezing, cough -denies:fever, SOB, NVD, tooth pain, sinus pain -has tried: zyrtec sometimes -sick contacts/travel/risks: denies flu exposure or Ebola risks -Hx of: allergies - seasonal  ROS: See pertinent positives and negatives per HPI.  Past Medical History  Diagnosis Date  . Allergy     Rhinitis  . Diabetes mellitus     Type 2  . Hypertension   . 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 Anemia     NOS iron deficient and B12 deficient  . Hyperlipidemia   . OSA (obstructive sleep apnea)     cpap  . Arthritis   . GERD (gastroesophageal reflux disease)     Past Surgical History  Procedure Laterality Date  . Appendectomy    . Rotator cuff repair    . Septoplasty  1959    Deviated Septum  . 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 Colonoscopy    . Esophagogastroduodenoscopy  2006    gastritis  . Arm fracture Left 2011    with hardware    Family History  Problem Relation Age of Onset  . Heart disease Mother   . Breast cancer Mother   . Stroke Father   . Allergies Father     Father and children  . Coronary artery disease Brother   . Diabetes Neg Hx   . Colon cancer Neg Hx     History   Social History  . Marital Status: Married    Spouse Name: N/A    Number of Children: N/A  . Years of Education: N/A   Social History Main Topics  . Smoking status: Former Smoker -- 1.00 packs/day    Quit date: 07/14/1983  . Smokeless tobacco: Never Used  . Alcohol Use: 1.0 oz/week    2 drink(s) per week  . Drug Use: None  . Sexual Activity: None   Other Topics Concern  . None   Social History Narrative  . None    Current outpatient prescriptions:aspirin 81 MG tablet, Take 81 mg by mouth daily.  , Disp: , Rfl: ;  atorvastatin (LIPITOR) 20 MG tablet, Take 0.5 tablets (10 mg total) by mouth daily., Disp: 90 tablet, Rfl: 3;   calcium carbonate (OS-CAL) 600 MG TABS, Take 600 mg by mouth daily.  , Disp: , Rfl: ;  cetirizine (ZYRTEC) 10 MG tablet, Take 10 mg by mouth daily., Disp: , Rfl:  desoximetasone (TOPICORT) 0.25 % cream, APPLY TOPICALLY TWICE DAILY., Disp: 60 g, Rfl: 5;  doxazosin (CARDURA) 4 MG tablet, TAKE ONE TABLET BY MOUTH ONCE DAILY, Disp: 30 tablet, Rfl: 0;  Ferrous Sulfate (IRON) 28 MG TABS, Take 28 mg by mouth 2 (two) times daily.  , Disp: , Rfl: ;  fesoterodine (TOVIAZ) 8 MG TB24 tablet, Take 8 mg by mouth daily., Disp: , Rfl:  FLUoxetine (PROZAC) 20 MG capsule, TAKE ONE CAPSULE BY MOUTH ONCE DAILY, Disp: 30 capsule, Rfl: 5;  glipiZIDE (GLUCOTROL XL) 10 MG 24 hr tablet, TAKE ONE TABLET BY MOUTH ONCE DAILY., Disp: 90 tablet, Rfl: 2;  lisinopril (PRINIVIL,ZESTRIL) 5 MG tablet, TAKE ONE TABLET BY MOUTH ONCE DAILY., Disp: 90 tablet, Rfl: 2;  metFORMIN (GLUCOPHAGE) 1000 MG tablet, TAKE ONE TABLET BY MOUTH TWICE DAILY., Disp: 180 tablet, Rfl: 2 Multiple Vitamin (MULTIVITAMIN) tablet, Take 1 tablet by mouth daily.  , Disp: , Rfl: ;  oxybutynin (DITROPAN) 5 MG tablet, Take 5 mg by mouth daily. , Disp: , Rfl: ;  Pediatric Multiple Vit-Vit C (VITAMIN DAILY) LIQD, Take 2 drops by mouth daily.  , Disp: , Rfl: ;  pioglitazone (ACTOS) 45 MG tablet, TAKE 1 TABLET (45 MG TOTAL) BY MOUTH DAILY., Disp: 90 tablet, Rfl: 3 sitaGLIPtin (JANUVIA) 100 MG tablet, Take 1 tablet (100 mg total) by mouth daily., Disp: 30 tablet, Rfl: 5  EXAM:  Filed Vitals:   09/29/13 0903  BP: 101/68  Pulse: 68  Temp: 98.4 F (36.9 C)    Body mass index is 34.72 kg/(m^2).  GENERAL: vitals reviewed and listed above, alert, oriented, appears well hydrated and in no acute distress  HEENT: atraumatic, conjunttiva clear, no obvious abnormalities on inspection of external nose and ears, normal appearance of ear canals and TMs, clear nasal congestion, mild post oropharyngeal erythema with PND, no tonsillar edema or exudate, no sinus TTP  NECK: no  obvious masses on inspection  LUNGS: clear to auscultation bilaterally, no wheezes, rales or rhonchi, good air movement  CV: HRRR, no peripheral edema  MS: moves all extremities without noticeable abnormality  PSYCH: pleasant and cooperative, no obvious depression or anxiety  ASSESSMENT AND PLAN:  Discussed the following assessment and plan:  Allergic rhinitis  -given HPI and exam findings today, a serious infection or illness is unlikely. We discussed potential etiologies, with allergic rhinosinusitis likely. -trail of addition of INS, cont zyrtec, follow up as needed and as scheduled -of course, we advised to return or notify a doctor immediately if symptoms worsen or persist or new concerns arise.    Patient Instructions  -zyrtec daily  -can do trial of nasocort  -follow up with your doctor if worsens or persists     Kymir Coles R.

## 2013-09-29 NOTE — Patient Instructions (Signed)
-  zyrtec daily  -can do trial of nasocort  -follow up with your doctor if worsens or persists

## 2013-09-29 NOTE — Progress Notes (Signed)
Pre visit review using our clinic review tool, if applicable. No additional management support is needed unless otherwise documented below in the visit note. 

## 2013-10-12 ENCOUNTER — Ambulatory Visit (INDEPENDENT_AMBULATORY_CARE_PROVIDER_SITE_OTHER): Payer: Medicare Other | Admitting: Family Medicine

## 2013-10-12 ENCOUNTER — Encounter: Payer: Self-pay | Admitting: Family Medicine

## 2013-10-12 VITALS — BP 130/60 | HR 71 | Temp 98.4°F | Wt 233.0 lb

## 2013-10-12 DIAGNOSIS — J988 Other specified respiratory disorders: Secondary | ICD-10-CM

## 2013-10-12 DIAGNOSIS — J302 Other seasonal allergic rhinitis: Secondary | ICD-10-CM

## 2013-10-12 DIAGNOSIS — J309 Allergic rhinitis, unspecified: Secondary | ICD-10-CM

## 2013-10-12 DIAGNOSIS — J45909 Unspecified asthma, uncomplicated: Secondary | ICD-10-CM

## 2013-10-12 MED ORDER — ALBUTEROL SULFATE HFA 108 (90 BASE) MCG/ACT IN AERS
2.0000 | INHALATION_SPRAY | Freq: Four times a day (QID) | RESPIRATORY_TRACT | Status: DC | PRN
Start: 2013-10-12 — End: 2013-11-01

## 2013-10-12 MED ORDER — PREDNISONE 20 MG PO TABS: 40.0000 mg | ORAL_TABLET | Freq: Every day | ORAL | Status: DC

## 2013-10-12 MED ORDER — DOXYCYCLINE HYCLATE 100 MG PO CAPS
100.0000 mg | ORAL_CAPSULE | Freq: Two times a day (BID) | ORAL | Status: DC
Start: 2013-10-12 — End: 2013-11-01

## 2013-10-12 NOTE — Progress Notes (Signed)
Chief Complaint  Patient presents with  . Cough    HPI:  -started: last week, then resolved, then two days ago developed worsening nasal congestion, cough, wheezing, increased sputum production, mild SOB -denies:fever, NVD, tooth pain -has tried: antihistamine, delsum,  -sick contacts/travel/risks: denies flu exposure or Ebola risks -Hx of: allergies and asthma - reports has used albuterol in the past for this ROS: See pertinent positives and negatives per HPI.  Past Medical History  Diagnosis Date  . Allergy     Rhinitis  . Diabetes mellitus     Type 2  . Hypertension   . 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 Anemia     NOS iron deficient and B12 deficient  . Hyperlipidemia   . OSA (obstructive sleep apnea)     cpap  . Arthritis   . GERD (gastroesophageal reflux disease)     Past Surgical History  Procedure Laterality Date  . Appendectomy    . Rotator cuff repair    . Septoplasty  1959    Deviated Septum  . 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 Colonoscopy    . Esophagogastroduodenoscopy  2006    gastritis  . Arm fracture Left 2011    with hardware    Family History  Problem Relation Age of Onset  . Heart disease Mother   . Breast cancer Mother   . Stroke Father   . Allergies Father     Father and children  . Coronary artery disease Brother   . Diabetes Neg Hx   . Colon cancer Neg Hx     History   Social History  . Marital Status: Married    Spouse Name: N/A    Number of Children: N/A  . Years of Education: N/A   Social History Main Topics  . Smoking status: Former Smoker -- 1.00 packs/day    Quit date: 07/14/1983  . Smokeless tobacco: Never Used  . Alcohol Use: 1.0 oz/week    2 drink(s) per week  . Drug Use: None  . Sexual Activity: None   Other Topics Concern  . None   Social History Narrative  . None    Current outpatient prescriptions:aspirin 81 MG tablet, Take 81 mg by mouth daily.  , Disp: , Rfl: ;  atorvastatin (LIPITOR)  20 MG tablet, Take 0.5 tablets (10 mg total) by mouth daily., Disp: 90 tablet, Rfl: 3;  calcium carbonate (OS-CAL) 600 MG TABS, Take 600 mg by mouth daily.  , Disp: , Rfl: ;  cetirizine (ZYRTEC) 10 MG tablet, Take 10 mg by mouth daily., Disp: , Rfl:  desoximetasone (TOPICORT) 0.25 % cream, APPLY TOPICALLY TWICE DAILY., Disp: 60 g, Rfl: 5;  doxazosin (CARDURA) 4 MG tablet, TAKE ONE TABLET BY MOUTH ONCE DAILY, Disp: 30 tablet, Rfl: 0;  Ferrous Sulfate (IRON) 28 MG TABS, Take 28 mg by mouth 2 (two) times daily.  , Disp: , Rfl: ;  fesoterodine (TOVIAZ) 8 MG TB24 tablet, Take 8 mg by mouth daily., Disp: , Rfl:  FLUoxetine (PROZAC) 20 MG capsule, TAKE ONE CAPSULE BY MOUTH ONCE DAILY, Disp: 30 capsule, Rfl: 5;  glipiZIDE (GLUCOTROL XL) 10 MG 24 hr tablet, TAKE ONE TABLET BY MOUTH ONCE DAILY., Disp: 90 tablet, Rfl: 2;  lisinopril (PRINIVIL,ZESTRIL) 5 MG tablet, TAKE ONE TABLET BY MOUTH ONCE DAILY., Disp: 90 tablet, Rfl: 2;  metFORMIN (GLUCOPHAGE) 1000 MG tablet, TAKE ONE TABLET BY MOUTH TWICE DAILY., Disp: 180 tablet, Rfl: 2 Multiple Vitamin (MULTIVITAMIN)  tablet, Take 1 tablet by mouth daily.  , Disp: , Rfl: ;  oxybutynin (DITROPAN) 5 MG tablet, Take 5 mg by mouth daily. , Disp: , Rfl: ;  Pediatric Multiple Vit-Vit C (VITAMIN DAILY) LIQD, Take 2 drops by mouth daily.  , Disp: , Rfl: ;  pioglitazone (ACTOS) 45 MG tablet, TAKE 1 TABLET (45 MG TOTAL) BY MOUTH DAILY., Disp: 90 tablet, Rfl: 3 sitaGLIPtin (JANUVIA) 100 MG tablet, Take 1 tablet (100 mg total) by mouth daily., Disp: 30 tablet, Rfl: 5;  albuterol (PROAIR HFA) 108 (90 BASE) MCG/ACT inhaler, Inhale 2 puffs into the lungs every 6 (six) hours as needed for wheezing or shortness of breath., Disp: 1 Inhaler, Rfl: 0;  doxycycline (VIBRAMYCIN) 100 MG capsule, Take 1 capsule (100 mg total) by mouth 2 (two) times daily., Disp: 20 capsule, Rfl: 0 predniSONE (DELTASONE) 20 MG tablet, Take 2 tablets (40 mg total) by mouth daily with breakfast., Disp: 6 tablet, Rfl:  0  EXAM:  Filed Vitals:   10/12/13 1602  BP: 130/60  Pulse: 71  Temp: 98.4 F (36.9 C)    Body mass index is 33.43 kg/(m^2).  GENERAL: vitals reviewed and listed above, alert, oriented, appears well hydrated and in no acute distress  HEENT: atraumatic, conjunttiva clear, no obvious abnormalities on inspection of external nose and ears, normal appearance of ear canals and TMs, clear nasal congestion, mild post oropharyngeal erythema with PND, no tonsillar edema or exudate, no sinus TTP  NECK: no obvious masses on inspection  LUNGS: L base wheezing, course sounds  CV: HRRR, no peripheral edema  MS: moves all extremities without noticeable abnormality  PSYCH: pleasant and cooperative, no obvious depression or anxiety  ASSESSMENT AND PLAN:  Discussed the following assessment and plan:  Asthma - Plan: albuterol (PROAIR HFA) 108 (90 BASE) MCG/ACT inhaler, predniSONE (DELTASONE) 20 MG tablet, doxycycline (VIBRAMYCIN) 100 MG capsule  Seasonal allergies  Respiratory infection - Plan: doxycycline (VIBRAMYCIN) 100 MG capsule  -likely viral or allergic but with hx ? Asthma, SOB, worsening, lung exam and increased sputum will tx with abx and alb after discussion risks, also discussed prednisone and he will consider this - risks discussed -of course, we advised to return or notify a doctor immediately if symptoms worsen or persist or new concerns arise.    Patient Instructions  -As we discussed, we have prescribed a new medication for you at this appointment. We discussed the common and serious potential adverse effects of this medication and you can review these and more with the pharmacist when you pick up your medication.  Please follow the instructions for use carefully and notify us immediately if you have any problems taking this medication.       Colin Benton R.

## 2013-10-12 NOTE — Progress Notes (Signed)
Pre visit review using our clinic review tool, if applicable. No additional management support is needed unless otherwise documented below in the visit note. 

## 2013-10-12 NOTE — Patient Instructions (Signed)

## 2013-10-18 ENCOUNTER — Other Ambulatory Visit: Payer: Medicare Other

## 2013-10-25 ENCOUNTER — Ambulatory Visit: Payer: Medicare Other | Admitting: Internal Medicine

## 2013-10-25 ENCOUNTER — Other Ambulatory Visit (INDEPENDENT_AMBULATORY_CARE_PROVIDER_SITE_OTHER): Payer: Medicare Other

## 2013-10-25 DIAGNOSIS — E1159 Type 2 diabetes mellitus with other circulatory complications: Secondary | ICD-10-CM

## 2013-10-25 LAB — LIPID PANEL
Cholesterol: 113 mg/dL (ref 0–200)
HDL: 57 mg/dL (ref >39.00)
LDL Cholesterol: 44 mg/dL (ref 0–99)
Total CHOL/HDL Ratio: 2
Triglycerides: 59 mg/dL (ref 0.0–149.0)
VLDL: 11.8 mg/dL (ref 0.0–40.0)

## 2013-10-25 LAB — BASIC METABOLIC PANEL
BUN: 27 mg/dL — ABNORMAL HIGH (ref 6–23)
CO2: 29 mEq/L (ref 19–32)
Calcium: 9 mg/dL (ref 8.4–10.5)
Chloride: 102 mEq/L (ref 96–112)
Creatinine, Ser: 1 mg/dL (ref 0.4–1.5)
GFR: 78.31 mL/min (ref >60.00)
Glucose, Bld: 121 mg/dL — ABNORMAL HIGH (ref 70–99)
Potassium: 4.6 mEq/L (ref 3.5–5.1)
Sodium: 139 mEq/L (ref 135–145)

## 2013-10-25 LAB — HEPATIC FUNCTION PANEL
ALT: 20 U/L (ref 0–53)
AST: 24 U/L (ref 0–37)
Albumin: 3.4 g/dL — ABNORMAL LOW (ref 3.5–5.2)
Alkaline Phosphatase: 52 U/L (ref 39–117)
Bilirubin, Direct: 0.1 mg/dL (ref 0.0–0.3)
Total Bilirubin: 0.7 mg/dL (ref 0.3–1.2)
Total Protein: 6.3 g/dL (ref 6.0–8.3)

## 2013-10-25 LAB — HEMOGLOBIN A1C: Hgb A1c MFr Bld: 6.3 % (ref 4.6–6.5)

## 2013-10-25 LAB — MICROALBUMIN / CREATININE URINE RATIO
Creatinine,U: 110.2 mg/dL
Microalb Creat Ratio: 5.6 mg/g (ref 0.0–30.0)
Microalb, Ur: 6.2 mg/dL — ABNORMAL HIGH (ref 0.0–1.9)

## 2013-11-01 ENCOUNTER — Ambulatory Visit (INDEPENDENT_AMBULATORY_CARE_PROVIDER_SITE_OTHER): Payer: Medicare Other | Admitting: Internal Medicine

## 2013-11-01 ENCOUNTER — Encounter: Payer: Self-pay | Admitting: Internal Medicine

## 2013-11-01 VITALS — BP 124/50 | HR 76 | Temp 98.5°F | Ht 70.0 in | Wt 234.0 lb

## 2013-11-01 DIAGNOSIS — E538 Deficiency of other specified B group vitamins: Secondary | ICD-10-CM

## 2013-11-01 DIAGNOSIS — E119 Type 2 diabetes mellitus without complications: Secondary | ICD-10-CM

## 2013-11-01 DIAGNOSIS — Z23 Encounter for immunization: Secondary | ICD-10-CM

## 2013-11-01 LAB — HM DIABETES FOOT EXAM

## 2013-11-01 MED ORDER — CYANOCOBALAMIN 1000 MCG/ML IJ SOLN
1000.0000 ug | Freq: Once | INTRAMUSCULAR | Status: AC
  Administered 2013-11-01: 1000 ug via INTRAMUSCULAR

## 2013-11-01 MED ORDER — ATORVASTATIN CALCIUM 20 MG PO TABS: 10.0000 mg | ORAL_TABLET | Freq: Every day | ORAL | Status: DC

## 2013-11-01 NOTE — Progress Notes (Signed)
Pre visit review using our clinic review tool, if applicable. No additional management support is needed unless otherwise documented below in the visit note. 

## 2013-11-01 NOTE — Progress Notes (Signed)
DM- home cbgs not done Lab Results  Component Value Date   HGBA1C 6.3 10/25/2013    Lipids: Tolerating meds Lipid Panel     Component Value Date/Time   CHOL 113 10/25/2013 0816   TRIG 59.0 10/25/2013 0816   HDL 57.00 10/25/2013 0816   CHOLHDL 2 10/25/2013 0816   VLDL 11.8 10/25/2013 0816   LDLCALC 44 10/25/2013 0816    Bladder- sees urology On ditropan bid (very dry mouth)

## 2013-11-10 ENCOUNTER — Telehealth: Payer: Self-pay

## 2013-11-10 NOTE — Telephone Encounter (Signed)
Relevant patient education assigned to patient using Emmi. ° °

## 2013-11-14 ENCOUNTER — Other Ambulatory Visit: Payer: Self-pay | Admitting: Internal Medicine

## 2013-11-24 ENCOUNTER — Ambulatory Visit (INDEPENDENT_AMBULATORY_CARE_PROVIDER_SITE_OTHER): Payer: Medicare Other | Admitting: *Deleted

## 2013-11-24 DIAGNOSIS — E538 Deficiency of other specified B group vitamins: Secondary | ICD-10-CM

## 2013-11-24 MED ORDER — CYANOCOBALAMIN 1000 MCG/ML IJ SOLN
1000.0000 ug | Freq: Once | INTRAMUSCULAR | Status: AC
  Administered 2013-11-24: 1000 ug via INTRAMUSCULAR

## 2013-11-29 ENCOUNTER — Ambulatory Visit: Payer: Medicare Other | Admitting: *Deleted

## 2013-12-01 ENCOUNTER — Ambulatory Visit: Payer: Medicare Other | Admitting: *Deleted

## 2013-12-06 ENCOUNTER — Other Ambulatory Visit: Payer: Self-pay | Admitting: Internal Medicine

## 2013-12-27 ENCOUNTER — Ambulatory Visit (INDEPENDENT_AMBULATORY_CARE_PROVIDER_SITE_OTHER): Payer: Medicare Other | Admitting: *Deleted

## 2013-12-27 DIAGNOSIS — E538 Deficiency of other specified B group vitamins: Secondary | ICD-10-CM

## 2013-12-27 MED ORDER — CYANOCOBALAMIN 1000 MCG/ML IJ SOLN
1000.0000 ug | Freq: Once | INTRAMUSCULAR | Status: AC
  Administered 2013-12-27: 1000 ug via INTRAMUSCULAR

## 2014-01-03 ENCOUNTER — Other Ambulatory Visit: Payer: Self-pay | Admitting: *Deleted

## 2014-01-03 MED ORDER — SITAGLIPTIN PHOSPHATE 100 MG PO TABS: 100.0000 mg | ORAL_TABLET | Freq: Every day | ORAL | Status: DC

## 2014-01-08 ENCOUNTER — Telehealth: Payer: Self-pay | Admitting: Internal Medicine

## 2014-01-08 MED ORDER — FLUOXETINE HCL 20 MG PO CAPS
ORAL_CAPSULE | ORAL | Status: DC
Start: 2014-01-08 — End: 2014-07-20

## 2014-01-08 NOTE — Telephone Encounter (Signed)
[facility] [place], [place] is requesting re-fill on FLUoxetine (PROZAC) 20 MG capsule

## 2014-01-08 NOTE — Telephone Encounter (Signed)
rx sent in electronically 

## 2014-01-31 ENCOUNTER — Ambulatory Visit (INDEPENDENT_AMBULATORY_CARE_PROVIDER_SITE_OTHER): Payer: Medicare Other | Admitting: *Deleted

## 2014-01-31 DIAGNOSIS — E538 Deficiency of other specified B group vitamins: Secondary | ICD-10-CM

## 2014-01-31 MED ORDER — CYANOCOBALAMIN 1000 MCG/ML IJ SOLN
1000.0000 ug | Freq: Once | INTRAMUSCULAR | Status: AC
  Administered 2014-01-31: 1000 ug via INTRAMUSCULAR

## 2014-02-07 ENCOUNTER — Encounter: Payer: Self-pay | Admitting: Internal Medicine

## 2014-02-07 ENCOUNTER — Ambulatory Visit (INDEPENDENT_AMBULATORY_CARE_PROVIDER_SITE_OTHER): Payer: Medicare Other | Admitting: Internal Medicine

## 2014-02-07 VITALS — BP 138/64 | Temp 98.3°F | Ht 70.0 in | Wt 225.0 lb

## 2014-02-07 DIAGNOSIS — R269 Unspecified abnormalities of gait and mobility: Secondary | ICD-10-CM

## 2014-02-07 NOTE — Progress Notes (Signed)
6 week history of "bumping into things". States he feels fine walking and then suddenly bumps into walls/door jams.  He does admit to falling (putting on pants). He does not admit to a fall that was not caused by something else. He does admit to hitting his head "hard" 2 months ago.   patinet with known DM - no hx of neuropathy.   Past Medical History  Diagnosis Date  . Allergy     Rhinitis  . Diabetes mellitus     Type 2  . Hypertension   . 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 Anemia     NOS iron deficient and B12 deficient  . Hyperlipidemia   . OSA (obstructive sleep apnea)     cpap  . Arthritis   . GERD (gastroesophageal reflux disease)     History   Social History  . Marital Status: Married    Spouse Name: N/A    Number of Children: N/A  . Years of Education: N/A   Occupational History  . Not on file.   Social History Main Topics  . Smoking status: Former Smoker -- 1.00 packs/day    Quit date: 07/14/1983  . Smokeless tobacco: Never Used  . Alcohol Use: 1.0 oz/week    2 drink(s) per week  . Drug Use: Not on file  . Sexual Activity: Not on file   Other Topics Concern  . Not on file   Social History Narrative  . No narrative on file    Past Surgical History  Procedure Laterality Date  . Appendectomy    . Rotator cuff repair    . Septoplasty  1959    Deviated Septum  . 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 Colonoscopy    . Esophagogastroduodenoscopy  2006    gastritis  . Arm fracture Left 2011    with hardware    Family History  Problem Relation Age of Onset  . Heart disease Mother   . Breast cancer Mother   . Stroke Father   . Allergies Father     Father and children  . Coronary artery disease Brother   . Diabetes Neg Hx   . Colon cancer Neg Hx     Allergies  Allergen Reactions  . Penicillins     REACTION: unspecified  . Sulfamethoxazole     REACTION: as child    Current Outpatient Prescriptions on File Prior to Visit   Medication Sig Dispense Refill  . aspirin 81 MG tablet Take 81 mg by mouth daily.        [name] atorvastatin (LIPITOR) 20 MG tablet Take 0.5 tablets (10 mg total) by mouth daily.  90 tablet  3  . cetirizine (ZYRTEC) 10 MG tablet Take 10 mg by mouth daily.      [name] desoximetasone (TOPICORT) 0.25 % cream APPLY  TWICE DAILY  60 g  5  . doxazosin (CARDURA) 4 MG tablet TAKE ONE TABLET BY MOUTH ONCE DAILY  30 tablet  0  . FLUoxetine (PROZAC) 20 MG capsule TAKE ONE CAPSULE BY MOUTH ONCE DAILY  30 capsule  5  . glipiZIDE (GLUCOTROL XL) 10 MG 24 hr tablet TAKE ONE TABLET BY MOUTH ONCE DAILY.  90 tablet  2  . lisinopril (PRINIVIL,ZESTRIL) 5 MG tablet TAKE ONE TABLET BY MOUTH ONCE DAILY.  90 tablet  2  . metFORMIN (GLUCOPHAGE) 1000 MG tablet TAKE ONE TABLET BY MOUTH TWICE DAILY.  180 tablet  2  . oxybutynin (  DITROPAN) 5 MG tablet Take 5 mg by mouth daily.       . sitaGLIPtin (JANUVIA) 100 MG tablet Take 1 tablet (100 mg total) by mouth daily.  30 tablet  5   No current facility-administered medications on file prior to visit.     patient denies chest pain, shortness of breath, orthopnea. Denies lower extremity edema, abdominal pain, change in appetite, change in bowel movements. Patient denies rashes, musculoskeletal complaints. No other specific complaints in a complete review of systems.   BP 138/64  Temp(Src) 98.3 F (36.8 C) (Oral)  Ht 5\' 10"  (1.778 m)  Wt 225 lb (102.059 kg)  BMI 32.28 kg/m2  well-developed well-nourished male in no acute distress. HEENT exam atraumatic, normocephalic, neck supple without jugular venous distention. Chest clear to auscultation cardiac exam S1-S2 are regular. Abdominal exam overweight with bowel sounds, soft and nontender. Extremities no edema. Neurologic exam is alert with a broad based gait Romberg negative Tandem gait-- unable to do   Gait abnormality Ct head  PT for gait training

## 2014-02-07 NOTE — Progress Notes (Signed)
Pre visit review using our clinic review tool, if applicable. No additional management support is needed unless otherwise documented below in the visit note. 

## 2014-02-08 ENCOUNTER — Other Ambulatory Visit: Payer: Medicare Other

## 2014-02-09 ENCOUNTER — Ambulatory Visit (INDEPENDENT_AMBULATORY_CARE_PROVIDER_SITE_OTHER)
Admission: RE | Admit: 2014-02-09 | Discharge: 2014-02-09 | Disposition: A | Discharge status: Home / Self Care | Payer: Medicare Other | Source: Ambulatory Visit | Attending: Internal Medicine | Admitting: Internal Medicine

## 2014-02-09 DIAGNOSIS — R269 Unspecified abnormalities of gait and mobility: Secondary | ICD-10-CM

## 2014-02-12 ENCOUNTER — Ambulatory Visit: Payer: Medicare Other | Attending: Internal Medicine | Admitting: Physical Therapy

## 2014-02-12 DIAGNOSIS — E119 Type 2 diabetes mellitus without complications: Secondary | ICD-10-CM | POA: Insufficient documentation

## 2014-02-12 DIAGNOSIS — Z9181 History of falling: Secondary | ICD-10-CM | POA: Insufficient documentation

## 2014-02-12 DIAGNOSIS — R269 Unspecified abnormalities of gait and mobility: Secondary | ICD-10-CM | POA: Diagnosis not present

## 2014-02-12 DIAGNOSIS — IMO0001 Reserved for inherently not codable concepts without codable children: Secondary | ICD-10-CM | POA: Diagnosis not present

## 2014-02-27 ENCOUNTER — Ambulatory Visit: Payer: Medicare Other | Admitting: Physical Therapy

## 2014-02-27 DIAGNOSIS — IMO0001 Reserved for inherently not codable concepts without codable children: Secondary | ICD-10-CM | POA: Diagnosis not present

## 2014-03-07 ENCOUNTER — Ambulatory Visit (INDEPENDENT_AMBULATORY_CARE_PROVIDER_SITE_OTHER): Payer: Medicare Other | Admitting: *Deleted

## 2014-03-07 DIAGNOSIS — E538 Deficiency of other specified B group vitamins: Secondary | ICD-10-CM

## 2014-03-07 MED ORDER — CYANOCOBALAMIN 1000 MCG/ML IJ SOLN
1000.0000 ug | Freq: Once | INTRAMUSCULAR | Status: AC
  Administered 2014-03-07: 1000 ug via INTRAMUSCULAR

## 2014-03-09 ENCOUNTER — Ambulatory Visit: Payer: Medicare Other | Admitting: Physical Therapy

## 2014-04-06 ENCOUNTER — Ambulatory Visit (INDEPENDENT_AMBULATORY_CARE_PROVIDER_SITE_OTHER): Payer: Medicare Other | Admitting: Internal Medicine

## 2014-04-06 ENCOUNTER — Encounter: Payer: Self-pay | Admitting: Internal Medicine

## 2014-04-06 ENCOUNTER — Other Ambulatory Visit (INDEPENDENT_AMBULATORY_CARE_PROVIDER_SITE_OTHER): Payer: Medicare Other

## 2014-04-06 VITALS — BP 132/80 | HR 73 | Temp 98.7°F | Resp 16 | Ht 70.0 in | Wt 227.0 lb

## 2014-04-06 DIAGNOSIS — D649 Anemia, unspecified: Secondary | ICD-10-CM

## 2014-04-06 DIAGNOSIS — E785 Hyperlipidemia, unspecified: Secondary | ICD-10-CM

## 2014-04-06 DIAGNOSIS — I10 Essential (primary) hypertension: Secondary | ICD-10-CM

## 2014-04-06 DIAGNOSIS — G4733 Obstructive sleep apnea (adult) (pediatric): Secondary | ICD-10-CM

## 2014-04-06 DIAGNOSIS — J309 Allergic rhinitis, unspecified: Secondary | ICD-10-CM

## 2014-04-06 DIAGNOSIS — F329 Major depressive disorder, single episode, unspecified: Secondary | ICD-10-CM

## 2014-04-06 DIAGNOSIS — E119 Type 2 diabetes mellitus without complications: Secondary | ICD-10-CM

## 2014-04-06 DIAGNOSIS — E559 Vitamin D deficiency, unspecified: Secondary | ICD-10-CM

## 2014-04-06 DIAGNOSIS — Z23 Encounter for immunization: Secondary | ICD-10-CM

## 2014-04-06 DIAGNOSIS — F3289 Other specified depressive episodes: Secondary | ICD-10-CM

## 2014-04-06 DIAGNOSIS — H47019 Ischemic optic neuropathy, unspecified eye: Secondary | ICD-10-CM

## 2014-04-06 DIAGNOSIS — N320 Bladder-neck obstruction: Secondary | ICD-10-CM

## 2014-04-06 DIAGNOSIS — Z0000 Encounter for general adult medical examination without abnormal findings: Secondary | ICD-10-CM

## 2014-04-06 LAB — CBC WITH DIFFERENTIAL/PLATELET
Basophils Absolute: 0 10*3/uL (ref 0.0–0.1)
Basophils Relative: 0.2 % (ref 0.0–3.0)
Eosinophils Absolute: 0.3 10*3/uL (ref 0.0–0.7)
Eosinophils Relative: 4.6 % (ref 0.0–5.0)
HCT: 35.9 % — ABNORMAL LOW (ref 39.0–52.0)
Hemoglobin: 11.7 g/dL — ABNORMAL LOW (ref 13.0–17.0)
Lymphocytes Relative: 17.8 % (ref 12.0–46.0)
Lymphs Abs: 1.3 10*3/uL (ref 0.7–4.0)
MCHC: 32.5 g/dL (ref 30.0–36.0)
MCV: 84.6 fl (ref 78.0–100.0)
Monocytes Absolute: 0.8 10*3/uL (ref 0.1–1.0)
Monocytes Relative: 10.1 % (ref 3.0–12.0)
Neutro Abs: 5.1 10*3/uL (ref 1.4–7.7)
Neutrophils Relative %: 67.3 % (ref 43.0–77.0)
Platelets: 183 10*3/uL (ref 150.0–400.0)
RBC: 4.25 Mil/uL (ref 4.22–5.81)
RDW: 14.1 % (ref 11.5–15.5)
WBC: 7.5 10*3/uL (ref 4.0–10.5)

## 2014-04-06 LAB — COMPREHENSIVE METABOLIC PANEL
ALT: 24 U/L (ref 0–53)
AST: 24 U/L (ref 0–37)
Albumin: 3.8 g/dL (ref 3.5–5.2)
Alkaline Phosphatase: 70 U/L (ref 39–117)
BUN: 26 mg/dL — ABNORMAL HIGH (ref 6–23)
CO2: 29 mEq/L (ref 19–32)
Calcium: 9.1 mg/dL (ref 8.4–10.5)
Chloride: 99 mEq/L (ref 96–112)
Creatinine, Ser: 1 mg/dL (ref 0.4–1.5)
GFR: 73.89 mL/min (ref >60.00)
Glucose, Bld: 182 mg/dL — ABNORMAL HIGH (ref 70–99)
Potassium: 4.5 mEq/L (ref 3.5–5.1)
Sodium: 133 mEq/L — ABNORMAL LOW (ref 135–145)
Total Bilirubin: 0.7 mg/dL (ref 0.2–1.2)
Total Protein: 6.5 g/dL (ref 6.0–8.3)

## 2014-04-06 LAB — URINALYSIS, ROUTINE W REFLEX MICROSCOPIC
Bilirubin Urine: NEGATIVE
Hgb urine dipstick: NEGATIVE
Ketones, ur: NEGATIVE
Nitrite: NEGATIVE
Specific Gravity, Urine: 1.01 (ref 1.000–1.030)
Urine Glucose: NEGATIVE
Urobilinogen, UA: 0.2 (ref 0.0–1.0)
pH: 6.5 (ref 5.0–8.0)

## 2014-04-06 LAB — TSH: TSH: 0.72 u[IU]/mL (ref 0.35–4.50)

## 2014-04-06 LAB — LIPID PANEL
Cholesterol: 127 mg/dL (ref 0–200)
HDL: 50 mg/dL (ref >39.00)
LDL Cholesterol: 53 mg/dL (ref 0–99)
NonHDL: 77
Total CHOL/HDL Ratio: 3
Triglycerides: 119 mg/dL (ref 0.0–149.0)
VLDL: 23.8 mg/dL (ref 0.0–40.0)

## 2014-04-06 LAB — VITAMIN D 25 HYDROXY (VIT D DEFICIENCY, FRACTURES): VITD: 33.26 ng/mL (ref 30.00–100.00)

## 2014-04-06 LAB — VITAMIN B12: Vitamin B-12: >1500 pg/mL — ABNORMAL HIGH (ref 211–911)

## 2014-04-06 LAB — IBC PANEL
Iron: 75 ug/dL (ref 42–165)
Saturation Ratios: 22.6 % (ref 20.0–50.0)
Transferrin: 236.6 mg/dL (ref 212.0–360.0)

## 2014-04-06 LAB — FERRITIN: Ferritin: 66.8 ng/mL (ref 22.0–322.0)

## 2014-04-06 LAB — FOLATE: Folate: 17.2 ng/mL (ref >5.9)

## 2014-04-06 LAB — FECAL OCCULT BLOOD, GUAIAC: Fecal Occult Blood: NEGATIVE

## 2014-04-06 LAB — HEMOGLOBIN A1C: Hgb A1c MFr Bld: 7.5 % — ABNORMAL HIGH (ref 4.6–6.5)

## 2014-04-06 LAB — PSA: PSA: 1.17 ng/mL (ref 0.10–4.00)

## 2014-04-06 NOTE — Assessment & Plan Note (Signed)
Will check his PSA today

## 2014-04-06 NOTE — Assessment & Plan Note (Signed)
He has no s/s of blood loss Will recheck his CBC and his vitamin levels today

## 2014-04-06 NOTE — Progress Notes (Signed)
Subjective:    Patient ID: Frank [name], male    DOB: 1941-03-16, 73 y.o.   MRN: [id number]  Diabetes He presents for his follow-up diabetic visit. He has type 2 diabetes mellitus. His disease course has been stable. There are no hypoglycemic associated symptoms. Pertinent negatives for hypoglycemia include no dizziness. Pertinent negatives for diabetes include no blurred vision, no chest pain, no fatigue, no foot paresthesias, no foot ulcerations, no polydipsia, no polyphagia, no polyuria, no visual change, no weakness and no weight loss. There are no hypoglycemic complications. Symptoms are stable. There are no diabetic complications. Current diabetic treatment includes oral agent (dual therapy). He is compliant with treatment all of the time. He is following a generally healthy diet. Meal planning includes avoidance of concentrated sweets. He participates in exercise intermittently. There is no change in his home blood glucose trend. An ACE inhibitor/angiotensin II receptor blocker is being taken. He does not see a podiatrist.Eye exam is current.      Review of Systems  Constitutional: Negative.  Negative for fever, chills, weight loss, diaphoresis, appetite change and fatigue.  HENT: Negative.   Eyes: Negative.  Negative for blurred vision.  Respiratory: Negative.  Negative for apnea, cough, choking, chest tightness, shortness of breath, wheezing and stridor.   Cardiovascular: Negative.  Negative for chest pain, palpitations and leg swelling.  Gastrointestinal: Negative.  Negative for nausea, vomiting, abdominal pain, diarrhea, constipation and blood in stool.  Endocrine: Negative.  Negative for polydipsia, polyphagia and polyuria.  Genitourinary: Negative.  Negative for dysuria, urgency, frequency, hematuria, decreased urine volume, difficulty urinating and genital sores.  Musculoskeletal: Negative.  Negative for arthralgias, back pain, joint swelling and myalgias.  Skin:  Negative.  Negative for rash.  Allergic/Immunologic: Negative.   Neurological: Negative.  Negative for dizziness and weakness.  Hematological: Negative.  Negative for adenopathy. Does not bruise/bleed easily.  Psychiatric/Behavioral: Negative.        Objective:   Physical Exam  Vitals reviewed. Constitutional: He is oriented to person, place, and time. He appears well-developed and well-nourished. No distress.  HENT:  Head: Normocephalic and atraumatic.  Mouth/Throat: Oropharynx is clear and moist. No oropharyngeal exudate.  Eyes: Conjunctivae are normal. Right eye exhibits no discharge. Left eye exhibits no discharge. No scleral icterus.  Neck: Normal range of motion. Neck supple. No JVD present. No tracheal deviation present. No thyromegaly present.  Cardiovascular: Normal rate, regular rhythm, normal heart sounds and intact distal pulses.  Exam reveals no gallop and no friction rub.   No murmur heard. Pulmonary/Chest: Effort normal and breath sounds normal. No stridor. No respiratory distress. He has no wheezes. He has no rales. He exhibits no tenderness.  Abdominal: Soft. Bowel sounds are normal. He exhibits no distension and no mass. There is no tenderness. There is no rebound and no guarding. Hernia confirmed negative in the right inguinal area and confirmed negative in the left inguinal area.  Genitourinary: Rectum normal, testes normal and penis normal. Rectal exam shows no external hemorrhoid, no internal hemorrhoid, no fissure, no mass, no tenderness and anal tone normal. Guaiac negative stool. 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 Prostate is not tender. Right testis shows no mass, no swelling and no tenderness. Right testis is descended. Left testis shows no mass, no swelling and no tenderness. Left testis is descended. Uncircumcised. No phimosis, paraphimosis, hypospadias, penile erythema or penile tenderness. No discharge found.  Musculoskeletal: Normal range of motion. He  exhibits no edema and no tenderness.  Lymphadenopathy:  He has no cervical adenopathy.       Right: No inguinal adenopathy present.       Left: No inguinal adenopathy present.  Neurological: He is oriented to person, place, and time.  Skin: Skin is warm and dry. No rash noted. He is not diaphoretic. No erythema. No pallor.  Psychiatric: He has a normal mood and affect. His behavior is normal. Judgment and thought content normal.     Lab Results  Component Value Date   WBC 5.6 10/05/2011   HGB 11.9* 10/05/2011   HCT 36.7* 10/05/2011   PLT 157.0 10/05/2011   GLUCOSE 121* 10/25/2013   CHOL 113 10/25/2013   TRIG 59.0 10/25/2013   HDL 57.00 10/25/2013   LDLCALC 44 10/25/2013   ALT 20 10/25/2013   AST 24 10/25/2013   NA 139 10/25/2013   K 4.6 10/25/2013   CL 102 10/25/2013   CREATININE 1.0 10/25/2013   BUN 27* 10/25/2013   CO2 29 10/25/2013   TSH 1.28 10/05/2011   PSA 0.93 10/05/2011   HGBA1C 6.3 10/25/2013   MICROALBUR 6.2* 10/25/2013       Assessment & Plan:

## 2014-04-06 NOTE — Patient Instructions (Addendum)
Health Maintenance A healthy lifestyle and preventative care can promote health and wellness.  Maintain regular health, dental, and eye exams.  Eat a healthy diet. Foods like vegetables, fruits, whole grains, low-fat dairy products, and lean protein foods contain the nutrients you need and are low in calories. Decrease your intake of foods high in solid fats, added sugars, and salt. Get information about a proper diet from your health care provider, if necessary.  Regular physical exercise is one of the most important things you can do for your health. Most adults should get at least 150 minutes of moderate-intensity exercise (any activity that increases your heart rate and causes you to sweat) each week. In addition, most adults need muscle-strengthening exercises on 2 or more days a week.   Maintain a healthy weight. The body mass index (BMI) is a screening tool to identify possible weight problems. It provides an estimate of body fat based on height and weight. Your health care provider can find your BMI and can help you achieve or maintain a healthy weight. For males 20 years and older:  A BMI below 18.5 is considered underweight.  A BMI of 18.5 to 24.9 is normal.  A BMI of 25 to 29.9 is considered overweight.  A BMI of 30 and above is considered obese.  Maintain normal blood lipids and cholesterol by exercising and minimizing your intake of saturated fat. Eat a balanced diet with plenty of fruits and vegetables. Blood tests for lipids and cholesterol should begin at age 20 and be repeated every 5 years. If your lipid or cholesterol levels are high, you are over age 50, or you are at high risk for heart disease, you may need your cholesterol levels checked more frequently.Ongoing high lipid and cholesterol levels should be treated with medicines if diet and exercise are not working.  If you smoke, find out from your health care provider how to quit. If you do not use tobacco, do not  start.  Lung cancer screening is recommended for adults aged 55-80 years who are at high risk for developing lung cancer because of a history of smoking. A yearly low-dose CT scan of the lungs is recommended for people who have at least a 30-pack-year history of smoking and are current smokers or have quit within the past 15 years. A pack year of smoking is smoking an average of 1 pack of cigarettes a day for 1 year (for example, a 30-pack-year history of smoking could mean smoking 1 pack a day for 30 years or 2 packs a day for 15 years). Yearly screening should continue until the smoker has stopped smoking for at least 15 years. Yearly screening should be stopped for people who develop a health problem that would prevent them from having lung cancer treatment.  If you choose to drink alcohol, do not have more than 2 drinks per day. One drink is considered to be 12 oz (360 mL) of beer, 5 oz (150 mL) of wine, or 1.5 oz (45 mL) of liquor.  Avoid the use of street drugs. Do not share needles with anyone. Ask for help if you need support or instructions about stopping the use of drugs.  High blood pressure causes heart disease and increases the risk of stroke. Blood pressure should be checked at least every 1-2 years. Ongoing high blood pressure should be treated with medicines if weight loss and exercise are not effective.  If you are 45-79 years old, ask your health care provider if   you should take aspirin to prevent heart disease.  Diabetes screening involves taking a blood sample to check your fasting blood sugar level. This should be done once every 3 years after age 45 if you are at a normal weight and without risk factors for diabetes. Testing should be considered at a younger age or be carried out more frequently if you are overweight and have at least 1 risk factor for diabetes.  Colorectal cancer can be detected and often prevented. Most routine colorectal cancer screening begins at the age of 50  and continues through age 75. However, your health care provider may recommend screening at an earlier age if you have risk factors for colon cancer. On a yearly basis, your health care provider may provide home test kits to check for hidden blood in the stool. A small camera at the end of a tube may be used to directly examine the colon (sigmoidoscopy or colonoscopy) to detect the earliest forms of colorectal cancer. Talk to your health care provider about this at age 50 when routine screening begins. A direct exam of the colon should be repeated every 5-10 years through age 75, unless early forms of precancerous polyps or small growths are found.  People who are at an increased risk for hepatitis B should be screened for this virus. You are considered at high risk for hepatitis B if:  You were born in a country where hepatitis B occurs often. Talk with your health care provider about which countries are considered high risk.  Your parents were born in a high-risk country and you have not received a shot to protect against hepatitis B (hepatitis B vaccine).  You have HIV or AIDS.  You use needles to inject street drugs.  You live with, or have sex with, someone who has hepatitis B.  You are a man who has sex with other men (MSM).  You get hemodialysis treatment.  You take certain medicines for conditions like cancer, organ transplantation, and autoimmune conditions.  Hepatitis C blood testing is recommended for all people born from 1945 through 1965 and any individual with known risk factors for hepatitis C.  Healthy men should no longer receive prostate-specific antigen (PSA) blood tests as part of routine cancer screening. Talk to your health care provider about prostate cancer screening.  Testicular cancer screening is not recommended for adolescents or adult males who have no symptoms. Screening includes self-exam, a health care provider exam, and other screening tests. Consult with your  health care provider about any symptoms you have or any concerns you have about testicular cancer.  Practice safe sex. Use condoms and avoid high-risk sexual practices to reduce the spread of sexually transmitted infections (STIs).  You should be screened for STIs, including gonorrhea and chlamydia if:  You are sexually active and are younger than 24 years.  You are older than 24 years, and your health care provider tells you that you are at risk for this type of infection.  Your sexual activity has changed since you were last screened, and you are at an increased risk for chlamydia or gonorrhea. Ask your health care provider if you are at risk.  If you are at risk of being infected with HIV, it is recommended that you take a prescription medicine daily to prevent HIV infection. This is called pre-exposure prophylaxis (PrEP). You are considered at risk if:  You are a man who has sex with other men (MSM).  You are a heterosexual man who   is sexually active with multiple partners.  You take drugs by injection.  You are sexually active with a partner who has HIV.  Talk with your health care provider about whether you are at high risk of being infected with HIV. If you choose to begin PrEP, you should first be tested for HIV. You should then be tested every 3 months for as long as you are taking PrEP.  Use sunscreen. Apply sunscreen liberally and repeatedly throughout the day. You should seek shade when your shadow is shorter than you. Protect yourself by wearing long sleeves, pants, a wide-brimmed hat, and sunglasses year round whenever you are outdoors.  Tell your health care provider of new moles or changes in moles, especially if there is a change in shape or color. Also, tell your health care provider if a mole is larger than the size of a pencil eraser.  A one-time screening for abdominal aortic aneurysm (AAA) and surgical repair of large AAAs by ultrasound is recommended for men aged  65-75 years who are current or former smokers.  Stay current with your vaccines (immunizations). Document Released: 12/26/2007 Document Revised: 07/04/2013 Document Reviewed: 11/24/2010 Exit[employer] Patient Information 2015 [employer]. This information is not intended to replace advice given to you by your health care provider. Make sure you discuss any questions you have with your health care provider. Type 2 Diabetes Mellitus Type 2 diabetes mellitus, often simply referred to as type 2 diabetes, is a long-lasting (chronic) disease. In type 2 diabetes, the pancreas does not make enough insulin (a hormone), the cells are less responsive to the insulin that is made (insulin resistance), or both. Normally, insulin moves sugars from food into the tissue cells. The tissue cells use the sugars for energy. The lack of insulin or the lack of normal response to insulin causes excess sugars to build up in the blood instead of going into the tissue cells. As a result, high blood sugar (hyperglycemia) develops. The effect of high sugar (glucose) levels can cause many complications. Type 2 diabetes was also previously called adult-onset diabetes, but it can occur at any age.  RISK FACTORS  A person is predisposed to developing type 2 diabetes if someone in the family has the disease and also has one or more of the following primary risk factors:  Overweight.  An inactive lifestyle.  A history of consistently eating high-calorie foods. Maintaining a normal weight and regular physical activity can reduce the chance of developing type 2 diabetes. SYMPTOMS  A person with type 2 diabetes may not show symptoms initially. The symptoms of type 2 diabetes appear slowly. The symptoms include:  Increased thirst (polydipsia).  Increased urination (polyuria).  Increased urination during the night (nocturia).  Weight loss. This weight loss may be rapid.  Frequent, recurring infections.  Tiredness  (fatigue).  Weakness.  Vision changes, such as blurred vision.  Fruity smell to your breath.  Abdominal pain.  Nausea or vomiting.  Cuts or bruises which are slow to heal.  Tingling or numbness in the hands or feet. DIAGNOSIS Type 2 diabetes is frequently not diagnosed until complications of diabetes are present. Type 2 diabetes is diagnosed when symptoms or complications are present and when blood glucose levels are increased. Your blood glucose level may be checked by one or more of the following blood tests:  A fasting blood glucose test. You will not be allowed to eat for at least 8 hours before a blood sample is taken.  A random blood   glucose test. Your blood glucose is checked at any time of the day regardless of when you ate.  A hemoglobin A1c blood glucose test. A hemoglobin A1c test provides information about blood glucose control over the previous 3 months.  An oral glucose tolerance test (OGTT). Your blood glucose is measured after you have not eaten (fasted) for 2 hours and then after you drink a glucose-containing beverage. TREATMENT   You may need to take insulin or diabetes medicine daily to keep blood glucose levels in the desired range.  If you use insulin, you may need to adjust the dosage depending on the carbohydrates that you eat with each meal or snack. The treatment goal is to maintain the before meal blood sugar (preprandial glucose) level at 70-130 mg/dL. HOME CARE INSTRUCTIONS   Have your hemoglobin A1c level checked twice a year.  Perform daily blood glucose monitoring as directed by your health care provider.  Monitor urine ketones when you are ill and as directed by your health care provider.  Take your diabetes medicine or insulin as directed by your health care provider to maintain your blood glucose levels in the desired range.  Never run out of diabetes medicine or insulin. It is needed every day.  If you are using insulin, you may need to  adjust the amount of insulin given based on your intake of carbohydrates. Carbohydrates can raise blood glucose levels but need to be included in your diet. Carbohydrates provide vitamins, minerals, and fiber which are an essential part of a healthy diet. Carbohydrates are found in fruits, vegetables, whole grains, dairy products, legumes, and foods containing added sugars.  Eat healthy foods. You should make an appointment to see a registered dietitian to help you create an eating plan that is right for you.  Lose weight if you are overweight.  Carry a medical alert card or wear your medical alert jewelry.  Carry a 15-gram carbohydrate snack with you at all times to treat low blood glucose (hypoglycemia). Some examples of 15-gram carbohydrate snacks include:  Glucose tablets, 3 or 4.  Glucose gel, 15-gram tube.  Raisins, 2 tablespoons (24 grams).  Jelly beans, 6.  Animal crackers, 8.  Regular pop, 4 ounces (120 mL).  Gummy treats, 9.  Recognize hypoglycemia. Hypoglycemia occurs with blood glucose levels of 70 mg/dL and below. The risk for hypoglycemia increases when fasting or skipping meals, during or after intense exercise, and during sleep. Hypoglycemia symptoms can include:  Tremors or shakes.  Decreased ability to concentrate.  Sweating.  Increased heart rate.  Headache.  Dry mouth.  Hunger.  Irritability.  Anxiety.  Restless sleep.  Altered speech or coordination.  Confusion.  Treat hypoglycemia promptly. If you are alert and able to safely swallow, follow the 15:15 rule:  Take 15-20 grams of rapid-acting glucose or carbohydrate. Rapid-acting options include glucose gel, glucose tablets, or 4 ounces (120 mL) of fruit juice, regular soda, or low-fat milk.  Check your blood glucose level 15 minutes after taking the glucose.  Take 15-20 grams more of glucose if the repeat blood glucose level is still 70 mg/dL or below.  Eat a meal or snack within 1 hour  once blood glucose levels return to normal.  Be alert to feeling very thirsty and urinating more frequently than usual, which are early signs of hyperglycemia. An early awareness of hyperglycemia allows for prompt treatment. Treat hyperglycemia as directed by your health care provider.  Engage in at least 150 minutes of moderate-intensity physical activity   a week, spread over at least 3 days of the week or as directed by your health care provider. In addition, you should engage in resistance exercise at least 2 times a week or as directed by your health care provider. Try to spend no more than 90 minutes at one time inactive.  Adjust your medicine and food intake as needed if you start a new exercise or sport.  Follow your sick-day plan anytime you are unable to eat or drink as usual.  Do not use any tobacco products including cigarettes, chewing tobacco, or electronic cigarettes. If you need help quitting, ask your health care provider.  Limit alcohol intake to no more than 1 drink per day for nonpregnant women and 2 drinks per day for men. You should drink alcohol only when you are also eating food. Talk with your health care provider whether alcohol is safe for you. Tell your health care provider if you drink alcohol several times a week.  Keep all follow-up visits as directed by your health care provider. This is important.  Schedule an eye exam soon after the diagnosis of type 2 diabetes and then annually.  Perform daily skin and foot care. Examine your skin and feet daily for cuts, bruises, redness, nail problems, bleeding, blisters, or sores. A foot exam by a health care provider should be done annually.  Brush your teeth and gums at least twice a day and floss at least once a day. Follow up with your dentist regularly.  Share your diabetes management plan with your workplace or school.  Stay up-to-date with immunizations. It is recommended that people with diabetes who are over 65  years old get the pneumonia vaccine. In some cases, two separate shots may be given. Ask your health care provider if your pneumonia vaccination is up-to-date.  Learn to manage stress.  Obtain ongoing diabetes education and support as needed.  Participate in or seek rehabilitation as needed to maintain or improve independence and quality of life. Request a physical or occupational therapy referral if you are having foot or hand numbness, or difficulties with grooming, dressing, eating, or physical activity. SEEK MEDICAL CARE IF:   You are unable to eat food or drink fluids for more than 6 hours.  You have nausea and vomiting for more than 6 hours.  Your blood glucose level is over 240 mg/dL.  There is a change in mental status.  You develop an additional serious illness.  You have diarrhea for more than 6 hours.  You have been sick or have had a fever for a couple of days and are not getting better.  You have pain during any physical activity.  SEEK IMMEDIATE MEDICAL CARE IF:  You have difficulty breathing.  You have moderate to large ketone levels. MAKE SURE YOU:  Understand these instructions.  Will watch your condition.  Will get help right away if you are not doing well or get worse. Document Released: 06/29/2005 Document Revised: 11/13/2013 Document Reviewed: 01/26/2012 [employer] Patient Information 2015 [employer]. This information is not intended to replace advice given to you by your health care provider. Make sure you discuss any questions you have with your health care provider.  

## 2014-04-06 NOTE — Assessment & Plan Note (Signed)
Will recheck his Vit D level

## 2014-04-06 NOTE — Assessment & Plan Note (Signed)
His blood sugars have been well controlled

## 2014-04-06 NOTE — Assessment & Plan Note (Signed)
His BP is well controlled I will check his lytes and renal function 

## 2014-04-06 NOTE — Assessment & Plan Note (Signed)

## 2014-04-06 NOTE — Assessment & Plan Note (Signed)
He has achieved his LDL goal and is doing well on lipitor 

## 2014-04-07 ENCOUNTER — Other Ambulatory Visit: Payer: Self-pay | Admitting: Internal Medicine

## 2014-04-08 ENCOUNTER — Other Ambulatory Visit: Payer: Self-pay | Admitting: Internal Medicine

## 2014-04-18 ENCOUNTER — Other Ambulatory Visit: Payer: Self-pay | Admitting: Internal Medicine

## 2014-05-03 LAB — HM DIABETES EYE EXAM

## 2014-05-11 ENCOUNTER — Ambulatory Visit (INDEPENDENT_AMBULATORY_CARE_PROVIDER_SITE_OTHER): Payer: Medicare Other | Admitting: *Deleted

## 2014-05-11 DIAGNOSIS — E538 Deficiency of other specified B group vitamins: Secondary | ICD-10-CM

## 2014-05-11 MED ORDER — CYANOCOBALAMIN 1000 MCG/ML IJ SOLN
1000.0000 ug | Freq: Once | INTRAMUSCULAR | Status: AC
  Administered 2014-05-11: 1000 ug via INTRAMUSCULAR

## 2014-05-17 ENCOUNTER — Telehealth: Payer: Self-pay | Admitting: Internal Medicine

## 2014-05-17 ENCOUNTER — Other Ambulatory Visit: Payer: Self-pay | Admitting: Internal Medicine

## 2014-05-17 DIAGNOSIS — R270 Ataxia, unspecified: Secondary | ICD-10-CM | POA: Insufficient documentation

## 2014-05-17 NOTE — Telephone Encounter (Signed)
Pt wants a referral to Dr Metta Clines, Neurologist. Pt needs referral from PCP. Pt having problems with his balance.

## 2014-06-04 ENCOUNTER — Other Ambulatory Visit: Payer: Self-pay | Admitting: Internal Medicine

## 2014-06-13 ENCOUNTER — Ambulatory Visit (INDEPENDENT_AMBULATORY_CARE_PROVIDER_SITE_OTHER): Payer: Medicare Other | Admitting: *Deleted

## 2014-06-13 DIAGNOSIS — D509 Iron deficiency anemia, unspecified: Secondary | ICD-10-CM

## 2014-06-13 DIAGNOSIS — D518 Other vitamin B12 deficiency anemias: Secondary | ICD-10-CM

## 2014-06-13 MED ORDER — CYANOCOBALAMIN 1000 MCG/ML IJ SOLN
1000.0000 ug | Freq: Once | INTRAMUSCULAR | Status: AC
  Administered 2014-06-13: 1000 ug via INTRAMUSCULAR

## 2014-06-15 ENCOUNTER — Ambulatory Visit (INDEPENDENT_AMBULATORY_CARE_PROVIDER_SITE_OTHER): Payer: Medicare Other | Admitting: Neurology

## 2014-06-15 ENCOUNTER — Encounter: Payer: Self-pay | Admitting: Neurology

## 2014-06-15 VITALS — BP 140/84 | HR 77 | Ht 70.0 in | Wt 224.2 lb

## 2014-06-15 DIAGNOSIS — E0842 Diabetes mellitus due to underlying condition with diabetic polyneuropathy: Secondary | ICD-10-CM

## 2014-06-15 DIAGNOSIS — R278 Other lack of coordination: Secondary | ICD-10-CM

## 2014-06-15 NOTE — Progress Notes (Signed)
[facility] Clinic Note - Initial Visit   Date: 06/15/2014  Frank [name] MRN: [id number] DOB: 11/08/1940   Dear Dr Ronnald Ramp:  Thank you for your kind referral of Frank [name] for consultation of falls. Although his history is well known to you, please allow [place] to reiterate it for the purpose of our medical record. The patient was accompanied to the clinic by son who also provides collateral information.     History of Present Illness: Frank [name] is a 73 y.o. Caucasian male with fairly well-controlled diabetes mellitus for 20+ years, hypertension, hyperlipidemia, and OSA on CPAP presenting for evaluation of falls.    Starting in 2014, he had 1-2 falls and has fallen 3-4 times this year.  He has not sustained any significant injuries, except superficial abrasions and bruises.  He denies any weakness of the legs or preceding dizziness/lightheadedness. He mostly feels imbalance, especially on uneven surfaces.  His last fall was in September when he fell twice within an hour.  He was at [facility] in [place] and fell down three steps as he was coming out of the entrance.  The second fall occurred on uneven surface (cobble).  He was able to get up himself.  During the summer, he also had a fall going into his store room, but his son said that the ramp is on a steep incline and the wood surface gets slick so feels that it was more mechanical.  He hit the back of his head, but denies any loss of consciousness. He completed balance therapy in September 2015 without significant improvement.    He denies any weakness or numbness/tingling of the legs.  No tremors, slowed movements, changes in handwriting, constipation, or anosmia.    Out-side paper records, electronic medical record, and images have been reviewed where available and summarized as:  CT head 02/09/2014:  Negative Lab Results  Component Value Date   VITAMINB12 >1500* 04/06/2014     Lab Results  Component Value Date   TSH 0.72 04/06/2014   Lab Results  Component Value Date   HGBA1C 7.5* 04/06/2014     Past Medical History  Diagnosis Date  . Allergy     Rhinitis  . Diabetes mellitus     Type 2  . Hypertension   . 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 Anemia     NOS iron deficient and B12 deficient  . Hyperlipidemia   . OSA (obstructive sleep apnea)     cpap  . Arthritis   . GERD (gastroesophageal reflux disease)     Past Surgical History  Procedure Laterality Date  . Appendectomy    . Rotator cuff repair    . Septoplasty  1959    Deviated Septum  . 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 Colonoscopy    . Esophagogastroduodenoscopy  2006    gastritis  . Arm fracture Left 2011    with hardware     Medications:  Current Outpatient Prescriptions on File Prior to Visit  Medication Sig Dispense Refill  . aspirin 81 MG tablet Take 81 mg by mouth daily.      [name] atorvastatin (LIPITOR) 20 MG tablet Take 0.5 tablets (10 mg total) by mouth daily. 90 tablet 3  . cetirizine (ZYRTEC) 10 MG tablet Take 10 mg by mouth daily.    [name] desoximetasone (TOPICORT) 0.25 % cream APPLY  TWICE DAILY 60 g 5  . doxazosin (CARDURA) 4 MG tablet TAKE  ONE TABLET BY MOUTH ONCE DAILY 30 tablet 0  . doxazosin (CARDURA) 4 MG tablet TAKE ONE-HALF TABLET BY MOUTH AT BEDTIME 90 tablet 0  . FLUoxetine (PROZAC) 20 MG capsule TAKE ONE CAPSULE BY MOUTH ONCE DAILY 30 capsule 5  . glipiZIDE (GLUCOTROL XL) 10 MG 24 hr tablet TAKE ONE TABLET BY MOUTH ONCE DAILY 90 tablet 1  . lisinopril (PRINIVIL,ZESTRIL) 5 MG tablet TAKE ONE TABLET BY MOUTH ONCE DAILY 90 tablet 0  . metFORMIN (GLUCOPHAGE) 1000 MG tablet TAKE ONE TABLET BY MOUTH TWICE DAILY. 180 tablet 0  . oxybutynin (DITROPAN) 5 MG tablet Take 5 mg by mouth daily.     . sitaGLIPtin (JANUVIA) 100 MG tablet Take 1 tablet (100 mg total) by mouth daily. 30 tablet 5   No current facility-administered medications on file prior to  visit.    Allergies:  Allergies  Allergen Reactions  . Penicillins     REACTION: unspecified  . Sulfamethoxazole     REACTION: as child    Family History: Family History  Problem Relation Age of Onset  . Heart disease Mother   . Breast cancer Mother   . Stroke Father   . Allergies Father     Father and children  . Coronary artery disease Brother   . Diabetes Neg Hx   . Colon cancer Neg Hx     Social History: History   Social History  . Marital Status: Married    Spouse Name: N/A    Number of Children: N/A  . Years of Education: N/A   Occupational History  . Not on file.   Social History Main Topics  . Smoking status: Former Smoker -- 1.00 packs/day    Quit date: 07/14/1983  . Smokeless tobacco: Never Used  . Alcohol Use: 1.0 oz/week    2 drink(s) per week  . Drug Use: Not on file  . Sexual Activity: Not on file   Other Topics Concern  . Not on file   Social History Narrative    Review of Systems:  CONSTITUTIONAL: No fevers, chills, night sweats, or weight loss.   EYES: No visual changes or eye pain ENT: No hearing changes.  No history of nose bleeds.   RESPIRATORY: No cough, wheezing and shortness of breath.   CARDIOVASCULAR: Negative for chest pain, and palpitations.   GI: Negative for abdominal discomfort, blood in stools or black stools.  No recent change in bowel habits.   GU:  No history of incontinence.   MUSCLOSKELETAL: No history of joint pain or swelling.  No myalgias.   SKIN: Negative for lesions, rash, and itching.   HEMATOLOGY/ONCOLOGY: Negative for prolonged bleeding, bruising easily, and swollen nodes.  No history of cancer.   ENDOCRINE: Negative for cold or heat intolerance, polydipsia or goiter.   PSYCH:  No depression or anxiety symptoms.   NEURO: As Above.   Vital Signs:  BP 140/84 mmHg  Pulse 77  Ht 5\' 10"  (1.778 m)  Wt 224 lb 4 oz (101.719 kg)  BMI 32.18 kg/m2  SpO2 95%   General Medical Exam:   General:  Well appearing,  comfortable.   Eyes/ENT: see cranial nerve examination.   Neck: No masses appreciated.  Full range of motion without tenderness.  No carotid bruits. Respiratory:  Clear to auscultation, good air entry bilaterally.   Cardiac:  Regular rate and rhythm, no murmur.   Extremities:  No deformities, edema, or skin discoloration.  Skin:  No rashes or lesions.  Neurological Exam:  MENTAL STATUS including orientation to time, place, person, recent and remote memory, attention span and concentration, language, and fund of knowledge is normal.  Speech is not dysarthric.  CRANIAL NERVES: II:  Lower quadrant field deficits involving the left eye only.  No visual field defects of the right eye.  Fundoscopic exam is limited, dense cataract bilaterally.   III-IV-VI: Pupils equal round and reactive to light.  Normal conjugate, extra-ocular eye movements in all directions of gaze.  No nystagmus.  No ptosis.   V:  Normal facial sensation.     VII:  Normal facial symmetry and movements.  No pathologic facial reflexes.  VIII:  Normal hearing and vestibular function.   IX-X:  Normal palatal movement.   XI:  Normal shoulder shrug and head rotation.   XII:  Normal tongue strength and range of motion, no deviation or fasciculation.  MOTOR:  No atrophy, fasciculations or abnormal movements.  No pronator drift.  Tone is normal.    Right Upper Extremity:    Left Upper Extremity:    Deltoid  5/5   Deltoid  5/5   Biceps  5/5   Biceps  5/5   Triceps  5/5   Triceps  5/5   Wrist extensors  5/5   Wrist extensors  5/5   Wrist flexors  5/5   Wrist flexors  5/5   Finger extensors  5/5   Finger extensors  5/5   Finger flexors  5/5   Finger flexors  5/5   Dorsal interossei  5/5   Dorsal interossei  5/5   Abductor pollicis  5/5   Abductor pollicis  5/5   Tone (Ashworth scale)  0  Tone (Ashworth scale)  0   Right Lower Extremity:    Left Lower Extremity:    Hip flexors  5/5   Hip flexors  5/5   Hip extensors  5/5   Hip  extensors  5/5   Knee flexors  5/5   Knee flexors  5/5   Knee extensors  5/5   Knee extensors  5/5   Dorsiflexors  5/5   Dorsiflexors  5/5   Plantarflexors  5/5   Plantarflexors  5/5   Toe extensors  5/5   Toe extensors  5/5   Toe flexors  5/5   Toe flexors  5/5   Tone (Ashworth scale)  0  Tone (Ashworth scale)  0   MSRs:  Right                                                                 Left brachioradialis 2+  brachioradialis 2+  biceps 2+  biceps 2+  triceps 2+  triceps 2+  patellar 2+  patellar 2+  ankle jerk tr  ankle jerk tr  Hoffman no  Hoffman no  plantar response down  plantar response down   SENSORY:  Vibration and temperature reduced at feet bilaterally.  Otherwise normal and symmetric perception of light touch, pinprick, vibration, and proprioception.  Romberg's sign absent.   COORDINATION/GAIT: Normal finger-to- nose-finger and heel-to-shin.  Intact rapid alternating movements bilaterally.  Able to rise from a chair without using arms.  Gait narrow based and stable. Stressed gait intact.  He is unsteady with tandem gait after 3 steps.  IMPRESSION: Frank [name] is a 67 year-old gentleman with 20+year history of diabetes mellitus presenting for evaluation of falls. 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  Based on his history and exam, his falls predominately occurred on uneven surfaces suggesting that proprioceptive loss from his neuropathy was contributing.  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  I reassured him that there is no evidence of a neurodegenerative disorder such as Parkinson's disease. EMG was deferred.  I had extensive discussion with the patient regarding the pathogenesis, etiology,  management, and natural course of neuropathy.   He has already had CT brain, vitamin B12, TSH, and folate check which is normal.  At this time, management is targeted to keeping his sugars under control.  I recommended using a cane for gait assistance for prolonged distances and uneven surfaces.  Fall precautions were also discussed and literature was provided.  Return to clinic as needed   The duration of this appointment visit was 45 minutes of face-to-face time with the patient.  Greater than 50% of this time was spent in counseling, explanation of diagnosis, planning of further management, and coordination of care.   Thank you for allowing me to participate in patient's care.  If I can answer any additional questions, I would be pleased to do so.    Sincerely,    Shawnette Augello K. Posey Pronto, DO

## 2014-07-02 ENCOUNTER — Other Ambulatory Visit: Payer: Self-pay | Admitting: Internal Medicine

## 2014-07-05 ENCOUNTER — Ambulatory Visit: Payer: Medicare Other | Admitting: Neurology

## 2014-07-08 ENCOUNTER — Other Ambulatory Visit: Payer: Self-pay | Admitting: Internal Medicine

## 2014-07-09 ENCOUNTER — Ambulatory Visit: Payer: Medicare Other | Admitting: Neurology

## 2014-07-14 ENCOUNTER — Other Ambulatory Visit: Payer: Self-pay | Admitting: Internal Medicine

## 2014-07-18 ENCOUNTER — Ambulatory Visit: Payer: Medicare Other

## 2014-07-18 ENCOUNTER — Ambulatory Visit (INDEPENDENT_AMBULATORY_CARE_PROVIDER_SITE_OTHER): Payer: Medicare Other | Admitting: *Deleted

## 2014-07-18 DIAGNOSIS — E538 Deficiency of other specified B group vitamins: Secondary | ICD-10-CM | POA: Diagnosis not present

## 2014-07-18 DIAGNOSIS — D225 Melanocytic nevi of trunk: Secondary | ICD-10-CM | POA: Diagnosis not present

## 2014-07-18 DIAGNOSIS — L570 Actinic keratosis: Secondary | ICD-10-CM | POA: Diagnosis not present

## 2014-07-18 DIAGNOSIS — L821 Other seborrheic keratosis: Secondary | ICD-10-CM | POA: Diagnosis not present

## 2014-07-18 DIAGNOSIS — D1801 Hemangioma of skin and subcutaneous tissue: Secondary | ICD-10-CM | POA: Diagnosis not present

## 2014-07-18 MED ORDER — CYANOCOBALAMIN 1000 MCG/ML IJ SOLN
1000.0000 ug | Freq: Once | INTRAMUSCULAR | Status: AC
  Administered 2014-07-18: 1000 ug via INTRAMUSCULAR

## 2014-07-20 ENCOUNTER — Other Ambulatory Visit: Payer: Self-pay | Admitting: Internal Medicine

## 2014-07-25 DIAGNOSIS — G4733 Obstructive sleep apnea (adult) (pediatric): Secondary | ICD-10-CM | POA: Diagnosis not present

## 2014-07-27 ENCOUNTER — Other Ambulatory Visit: Payer: Self-pay

## 2014-07-27 MED ORDER — OXYBUTYNIN CHLORIDE 5 MG PO TABS: 5.0000 mg | ORAL_TABLET | Freq: Every day | ORAL | Status: DC

## 2014-07-27 MED ORDER — LISINOPRIL 5 MG PO TABS: 5.0000 mg | ORAL_TABLET | Freq: Every day | ORAL | Status: DC

## 2014-07-27 MED ORDER — FLUOXETINE HCL 20 MG PO CAPS: 20.0000 mg | ORAL_CAPSULE | Freq: Every day | ORAL | Status: DC

## 2014-07-27 MED ORDER — DOXAZOSIN MESYLATE 4 MG PO TABS: 2.0000 mg | ORAL_TABLET | Freq: Every day | ORAL | Status: DC

## 2014-07-27 MED ORDER — ATORVASTATIN CALCIUM 20 MG PO TABS: 10.0000 mg | ORAL_TABLET | Freq: Every day | ORAL | Status: DC

## 2014-07-30 ENCOUNTER — Other Ambulatory Visit: Payer: Self-pay

## 2014-07-30 MED ORDER — METFORMIN HCL 1000 MG PO TABS: 1000.0000 mg | ORAL_TABLET | Freq: Two times a day (BID) | ORAL | Status: DC

## 2014-08-01 ENCOUNTER — Telehealth: Payer: Self-pay | Admitting: Internal Medicine

## 2014-08-01 NOTE — Telephone Encounter (Signed)
Pt called in said that [facility] is needing a PA for the Atorvastatin   Fax number is [phone]

## 2014-08-02 ENCOUNTER — Other Ambulatory Visit: Payer: Self-pay

## 2014-08-02 MED ORDER — GLIPIZIDE ER 10 MG PO TB24: 10.0000 mg | ORAL_TABLET | Freq: Every day | ORAL | Status: DC

## 2014-08-02 MED ORDER — ATORVASTATIN CALCIUM 20 MG PO TABS: 10.0000 mg | ORAL_TABLET | Freq: Every day | ORAL | Status: DC

## 2014-08-02 NOTE — Telephone Encounter (Signed)
Only have received a duplicate med refill request for lipitor, rx sent to mail order.

## 2014-08-20 ENCOUNTER — Telehealth: Payer: Self-pay | Admitting: Internal Medicine

## 2014-08-20 DIAGNOSIS — E118 Type 2 diabetes mellitus with unspecified complications: Secondary | ICD-10-CM

## 2014-08-20 DIAGNOSIS — I10 Essential (primary) hypertension: Secondary | ICD-10-CM

## 2014-08-20 NOTE — Telephone Encounter (Signed)
Patient is requesting to speak with you or dr Ronnald Ramp today if possible.   Also, he is reqeusting that his labs be put in for Wednesday when her comes for b12 injection.

## 2014-08-21 MED ORDER — ALOGLIPTIN BENZOATE 25 MG PO TABS: 1.0000 | ORAL_TABLET | Freq: Every day | ORAL | Status: DC

## 2014-08-21 NOTE — Telephone Encounter (Signed)
Alternative or just take nothing at all?

## 2014-08-21 NOTE — Telephone Encounter (Signed)
Pt.notified

## 2014-08-21 NOTE — Telephone Encounter (Signed)
Discontinue januvia A1C has been ordered

## 2014-08-21 NOTE — Telephone Encounter (Signed)
Try nesina

## 2014-08-21 NOTE — Telephone Encounter (Signed)
Spoke with patient who just had CPX in 03/2014 and would like to have orders for A1c and whatever else testing may be needed at this time. He is to been seen for b12 tomorrow and would like to have collected while her. Lastly, pt can no longer afford Januvia (200$) and would like a mychart message or personal call back from MD to discuss a different med. Thanks

## 2014-08-22 ENCOUNTER — Ambulatory Visit (INDEPENDENT_AMBULATORY_CARE_PROVIDER_SITE_OTHER): Payer: Medicare Other

## 2014-08-22 ENCOUNTER — Encounter: Payer: Self-pay | Admitting: Internal Medicine

## 2014-08-22 ENCOUNTER — Other Ambulatory Visit (INDEPENDENT_AMBULATORY_CARE_PROVIDER_SITE_OTHER): Payer: Medicare Other

## 2014-08-22 DIAGNOSIS — I10 Essential (primary) hypertension: Secondary | ICD-10-CM

## 2014-08-22 DIAGNOSIS — E538 Deficiency of other specified B group vitamins: Secondary | ICD-10-CM | POA: Diagnosis not present

## 2014-08-22 DIAGNOSIS — E118 Type 2 diabetes mellitus with unspecified complications: Secondary | ICD-10-CM | POA: Diagnosis not present

## 2014-08-22 LAB — BASIC METABOLIC PANEL
BUN: 25 mg/dL — ABNORMAL HIGH (ref 6–23)
CO2: 33 mEq/L — ABNORMAL HIGH (ref 19–32)
Calcium: 9.5 mg/dL (ref 8.4–10.5)
Chloride: 97 mEq/L (ref 96–112)
Creatinine, Ser: 1.04 mg/dL (ref 0.40–1.50)
GFR: 73.81 mL/min (ref >60.00)
Glucose, Bld: 185 mg/dL — ABNORMAL HIGH (ref 70–99)
Potassium: 4.8 mEq/L (ref 3.5–5.1)
Sodium: 135 mEq/L (ref 135–145)

## 2014-08-22 LAB — HEMOGLOBIN A1C: Hgb A1c MFr Bld: 7.7 % — ABNORMAL HIGH (ref 4.6–6.5)

## 2014-08-22 MED ORDER — CYANOCOBALAMIN 1000 MCG/ML IJ SOLN
1000.0000 ug | Freq: Once | INTRAMUSCULAR | Status: AC
  Administered 2014-08-22: 1000 ug via INTRAMUSCULAR

## 2014-08-24 ENCOUNTER — Encounter: Payer: Self-pay | Admitting: Internal Medicine

## 2014-09-19 ENCOUNTER — Ambulatory Visit: Payer: Medicare Other

## 2014-09-20 ENCOUNTER — Ambulatory Visit (INDEPENDENT_AMBULATORY_CARE_PROVIDER_SITE_OTHER): Payer: Medicare Other

## 2014-09-20 DIAGNOSIS — E538 Deficiency of other specified B group vitamins: Secondary | ICD-10-CM | POA: Diagnosis not present

## 2014-09-20 MED ORDER — CYANOCOBALAMIN 1000 MCG/ML IJ SOLN
1000.0000 ug | Freq: Once | INTRAMUSCULAR | Status: AC
  Administered 2014-09-20: 1000 ug via INTRAMUSCULAR

## 2014-10-24 ENCOUNTER — Telehealth: Payer: Self-pay | Admitting: *Deleted

## 2014-10-24 ENCOUNTER — Other Ambulatory Visit: Payer: Self-pay | Admitting: Internal Medicine

## 2014-10-24 ENCOUNTER — Ambulatory Visit (INDEPENDENT_AMBULATORY_CARE_PROVIDER_SITE_OTHER): Payer: Medicare Other | Admitting: *Deleted

## 2014-10-24 DIAGNOSIS — E118 Type 2 diabetes mellitus with unspecified complications: Secondary | ICD-10-CM

## 2014-10-24 DIAGNOSIS — E538 Deficiency of other specified B group vitamins: Secondary | ICD-10-CM | POA: Diagnosis not present

## 2014-10-24 MED ORDER — CYANOCOBALAMIN 1000 MCG/ML IJ SOLN
1000.0000 ug | Freq: Once | INTRAMUSCULAR | Status: AC
  Administered 2014-10-24: 1000 ug via INTRAMUSCULAR

## 2014-10-24 MED ORDER — SITAGLIPTIN PHOSPHATE 100 MG PO TABS: 100.0000 mg | ORAL_TABLET | Freq: Every day | ORAL | Status: DC

## 2014-10-24 NOTE — Telephone Encounter (Signed)
Notified pt with md response. Pt stated that he never took the [place] because the med was too expensive. So he started taking the [place] after insurance approved med. He stated he would like to continue taking the [place]...Frank [name]

## 2014-10-24 NOTE — Telephone Encounter (Signed)
Notified pt md sent januvia to pharmacy...Frank [name]

## 2014-10-24 NOTE — Telephone Encounter (Signed)
Pt states he has been trying to get refill on his Januvia. Med has been taking off med list pt stated that at one point insurance didn't cover but he has been taking medication. Requesting refill to be [facility]...Johny Chess

## 2014-10-24 NOTE — Telephone Encounter (Signed)
changed

## 2014-10-24 NOTE — Telephone Encounter (Signed)
This has been changed to nesina

## 2014-11-20 DIAGNOSIS — G4733 Obstructive sleep apnea (adult) (pediatric): Secondary | ICD-10-CM | POA: Diagnosis not present

## 2014-11-23 ENCOUNTER — Other Ambulatory Visit: Payer: Self-pay | Admitting: Internal Medicine

## 2014-11-25 ENCOUNTER — Encounter: Payer: Self-pay | Admitting: Internal Medicine

## 2014-11-26 ENCOUNTER — Other Ambulatory Visit: Payer: Self-pay | Admitting: Internal Medicine

## 2014-11-26 DIAGNOSIS — N3281 Overactive bladder: Secondary | ICD-10-CM | POA: Insufficient documentation

## 2014-11-26 MED ORDER — OXYBUTYNIN CHLORIDE 5 MG PO TABS: 5.0000 mg | ORAL_TABLET | Freq: Two times a day (BID) | ORAL | Status: DC

## 2014-11-28 ENCOUNTER — Encounter: Payer: Self-pay | Admitting: Internal Medicine

## 2014-11-28 ENCOUNTER — Ambulatory Visit (INDEPENDENT_AMBULATORY_CARE_PROVIDER_SITE_OTHER): Payer: Medicare Other | Admitting: Internal Medicine

## 2014-11-28 ENCOUNTER — Other Ambulatory Visit (INDEPENDENT_AMBULATORY_CARE_PROVIDER_SITE_OTHER): Payer: Medicare Other

## 2014-11-28 VITALS — BP 124/80 | HR 84 | Temp 98.7°F | Resp 16 | Ht 70.0 in | Wt 212.0 lb

## 2014-11-28 DIAGNOSIS — D519 Vitamin B12 deficiency anemia, unspecified: Secondary | ICD-10-CM

## 2014-11-28 DIAGNOSIS — E118 Type 2 diabetes mellitus with unspecified complications: Secondary | ICD-10-CM | POA: Diagnosis not present

## 2014-11-28 DIAGNOSIS — I10 Essential (primary) hypertension: Secondary | ICD-10-CM | POA: Diagnosis not present

## 2014-11-28 DIAGNOSIS — N3281 Overactive bladder: Secondary | ICD-10-CM

## 2014-11-28 LAB — BASIC METABOLIC PANEL
BUN: 22 mg/dL (ref 6–23)
CO2: 31 mEq/L (ref 19–32)
Calcium: 9.8 mg/dL (ref 8.4–10.5)
Chloride: 98 mEq/L (ref 96–112)
Creatinine, Ser: 1.02 mg/dL (ref 0.40–1.50)
GFR: 75.43 mL/min (ref >60.00)
Glucose, Bld: 166 mg/dL — ABNORMAL HIGH (ref 70–99)
Potassium: 4.3 mEq/L (ref 3.5–5.1)
Sodium: 136 mEq/L (ref 135–145)

## 2014-11-28 LAB — CBC WITH DIFFERENTIAL/PLATELET
Basophils Absolute: 0 10*3/uL (ref 0.0–0.1)
Basophils Relative: 0.6 % (ref 0.0–3.0)
Eosinophils Absolute: 0.4 10*3/uL (ref 0.0–0.7)
Eosinophils Relative: 6.1 % — ABNORMAL HIGH (ref 0.0–5.0)
HCT: 37.2 % — ABNORMAL LOW (ref 39.0–52.0)
Hemoglobin: 12.4 g/dL — ABNORMAL LOW (ref 13.0–17.0)
Lymphocytes Relative: 19.4 % (ref 12.0–46.0)
Lymphs Abs: 1.3 10*3/uL (ref 0.7–4.0)
MCHC: 33.3 g/dL (ref 30.0–36.0)
MCV: 82.3 fl (ref 78.0–100.0)
Monocytes Absolute: 0.7 10*3/uL (ref 0.1–1.0)
Monocytes Relative: 10.4 % (ref 3.0–12.0)
Neutro Abs: 4.2 10*3/uL (ref 1.4–7.7)
Neutrophils Relative %: 63.5 % (ref 43.0–77.0)
Platelets: 202 10*3/uL (ref 150.0–400.0)
RBC: 4.52 Mil/uL (ref 4.22–5.81)
RDW: 13.8 % (ref 11.5–15.5)
WBC: 6.7 10*3/uL (ref 4.0–10.5)

## 2014-11-28 LAB — MICROALBUMIN / CREATININE URINE RATIO
Creatinine,U: 109.7 mg/dL
Microalb Creat Ratio: 14.7 mg/g (ref 0.0–30.0)
Microalb, Ur: 16.1 mg/dL — ABNORMAL HIGH (ref 0.0–1.9)

## 2014-11-28 LAB — URINALYSIS, ROUTINE W REFLEX MICROSCOPIC
Bilirubin Urine: NEGATIVE
Hgb urine dipstick: NEGATIVE
Ketones, ur: NEGATIVE
Nitrite: NEGATIVE
RBC / HPF: NONE SEEN (ref 0–?)
Specific Gravity, Urine: 1.02 (ref 1.000–1.030)
Total Protein, Urine: 30 — AB
Urine Glucose: NEGATIVE
Urobilinogen, UA: 0.2 (ref 0.0–1.0)
pH: 6 (ref 5.0–8.0)

## 2014-11-28 LAB — HEMOGLOBIN A1C: Hgb A1c MFr Bld: 7.3 % — ABNORMAL HIGH (ref 4.6–6.5)

## 2014-11-28 MED ORDER — CYANOCOBALAMIN 1000 MCG/ML IJ SOLN
1000.0000 ug | Freq: Once | INTRAMUSCULAR | Status: AC
  Administered 2014-11-28: 1000 ug via INTRAMUSCULAR

## 2014-11-28 MED ORDER — OXYBUTYNIN CHLORIDE 5 MG PO TABS: 5.0000 mg | ORAL_TABLET | Freq: Two times a day (BID) | ORAL | Status: DC

## 2014-11-28 NOTE — Patient Instructions (Signed)

## 2014-11-28 NOTE — Progress Notes (Signed)
Subjective:  Patient ID: Frank [name], male    DOB: [birth year]  Age: 74 y.o. MRN: [id number]  CC: Hypertension and Diabetes   HPI [name] presents for follow-up, he feels well and offers no complaints.  Outpatient Prescriptions Prior to Visit  Medication Sig Dispense Refill  . aspirin 81 MG tablet Take 81 mg by mouth daily.      [name] atorvastatin (LIPITOR) 20 MG tablet Take 0.5 tablets (10 mg total) by mouth daily. 90 tablet 3  . cetirizine (ZYRTEC) 10 MG tablet Take 10 mg by mouth daily.    [name] doxazosin (CARDURA) 4 MG tablet Take 0.5 tablets (2 mg total) by mouth at bedtime. 90 tablet 3  . FLUoxetine (PROZAC) 20 MG capsule Take 1 capsule (20 mg total) by mouth daily. 90 capsule 3  . glipiZIDE (GLUCOTROL XL) 10 MG 24 hr tablet Take 1 tablet (10 mg total) by mouth daily. 90 tablet 4  . lisinopril (PRINIVIL,ZESTRIL) 5 MG tablet Take 1 tablet (5 mg total) by mouth daily. 90 tablet 3  . metFORMIN (GLUCOPHAGE) 1000 MG tablet Take 1 tablet (1,000 mg total) by mouth 2 (two) times daily. 180 tablet 4  . sitaGLIPtin (JANUVIA) 100 MG tablet Take 1 tablet (100 mg total) by mouth daily. 90 tablet 3  . desoximetasone (TOPICORT) 0.25 % cream APPLY  TWICE DAILY (Patient not taking: Reported on 11/28/2014) 60 g 5  . oxybutynin (DITROPAN) 5 MG tablet Take 1 tablet (5 mg total) by mouth 2 (two) times daily. 180 tablet 3   No facility-administered medications prior to visit.    ROS Review of Systems  Constitutional: Negative.  Negative for fever, chills, diaphoresis, appetite change and fatigue.  HENT: Negative.   Eyes: Negative.   Respiratory: Negative.  Negative for cough, choking, chest tightness, shortness of breath and stridor.   Cardiovascular: Negative.  Negative for chest pain, palpitations and leg swelling.  Gastrointestinal: Negative.  Negative for nausea, vomiting, abdominal pain, diarrhea, constipation and blood in stool.  Endocrine: Negative.  Negative for polydipsia,  polyphagia and polyuria.  Genitourinary: Negative.  Negative for decreased urine volume, enuresis and difficulty urinating.  Musculoskeletal: Negative.  Negative for myalgias, back pain, arthralgias and neck pain.  Skin: Negative.   Allergic/Immunologic: Negative.   Neurological: Negative.  Negative for dizziness, tremors, syncope, light-headedness, numbness and headaches.  Hematological: Negative.  Negative for adenopathy. Does not bruise/bleed easily.  Psychiatric/Behavioral: Negative.     Objective:  BP 124/80 mmHg  Pulse 84  Temp(Src) 98.7 F (37.1 C) (Oral)  Resp 16  Ht 5\' 10"  (1.778 m)  Wt 212 lb (96.163 kg)  BMI 30.42 kg/m2  SpO2 96%  BP Readings from Last 3 Encounters:  11/28/14 124/80  06/15/14 140/84  04/06/14 132/80    Wt Readings from Last 3 Encounters:  11/28/14 212 lb (96.163 kg)  06/15/14 224 lb 4 oz (101.719 kg)  04/06/14 227 lb (102.967 kg)    Physical Exam  Constitutional: He is oriented to person, place, and time. He appears well-developed and well-nourished. No distress.  HENT:  Head: Normocephalic and atraumatic.  Mouth/Throat: No oropharyngeal exudate.  Eyes: Conjunctivae are normal. Right eye exhibits no discharge. Left eye exhibits no discharge. No scleral icterus.  Neck: Normal range of motion. Neck supple. No JVD present. No tracheal deviation present. No thyromegaly present.  Cardiovascular: Normal rate, regular rhythm, normal heart sounds and intact distal pulses.  Exam reveals no gallop and no friction rub.   No murmur heard.  Pulmonary/Chest: Effort normal and breath sounds normal. No stridor. No respiratory distress. He has no wheezes. He has no rales. He exhibits no tenderness.  Abdominal: Soft. Bowel sounds are normal. He exhibits no distension and no mass. There is no tenderness. There is no rebound and no guarding.  Musculoskeletal: Normal range of motion. He exhibits no edema or tenderness.  Lymphadenopathy:    He has no cervical  adenopathy.  Neurological: He is oriented to person, place, and time.  Skin: Skin is warm and dry. No rash noted. He is not diaphoretic. No erythema. No pallor.  Vitals reviewed.   Lab Results  Component Value Date   WBC 6.7 11/28/2014   HGB 12.4* 11/28/2014   HCT 37.2* 11/28/2014   PLT 202.0 11/28/2014   GLUCOSE 166* 11/28/2014   CHOL 127 04/06/2014   TRIG 119.0 04/06/2014   HDL 50.00 04/06/2014   LDLCALC 53 04/06/2014   ALT 24 04/06/2014   AST 24 04/06/2014   NA 136 11/28/2014   K 4.3 11/28/2014   CL 98 11/28/2014   CREATININE 1.02 11/28/2014   BUN 22 11/28/2014   CO2 31 11/28/2014   TSH 0.72 04/06/2014   PSA 1.17 04/06/2014   HGBA1C 7.3* 11/28/2014   MICROALBUR 16.1* 11/28/2014    Ct Head Wo Contrast  02/09/2014   CLINICAL DATA:  Fall.  Abnormal gait.  EXAM: CT HEAD WITHOUT CONTRAST  TECHNIQUE: Contiguous axial images were obtained from the base of the skull through the vertex without intravenous contrast.  COMPARISON:  None.  FINDINGS: No mass. No hydrocephalus. No hemorrhage. Diffuse cerebral atrophy. No hydrocephalus. Posterior fossa unremarkable. No acute bony abnormality. Mild mucosal thickening and ethmoidal sinuses.  IMPRESSION: No acute intracranial abnormality.   Electronically Signed   By: Marcello Moores  Register   On: 02/09/2014 11:31  Lab Results  Component Value Date   WBC 6.7 11/28/2014   HGB 12.4* 11/28/2014   HCT 37.2* 11/28/2014   PLT 202.0 11/28/2014   GLUCOSE 166* 11/28/2014   CHOL 127 04/06/2014   TRIG 119.0 04/06/2014   HDL 50.00 04/06/2014   LDLCALC 53 04/06/2014   ALT 24 04/06/2014   AST 24 04/06/2014   NA 136 11/28/2014   K 4.3 11/28/2014   CL 98 11/28/2014   CREATININE 1.02 11/28/2014   BUN 22 11/28/2014   CO2 31 11/28/2014   TSH 0.72 04/06/2014   PSA 1.17 04/06/2014   HGBA1C 7.3* 11/28/2014   MICROALBUR 16.1* 11/28/2014    Assessment & Plan:   [facility] was seen today for hypertension and diabetes.  Diagnoses and all orders for this  visit:  OAB (overactive bladder) Orders: -     oxybutynin (DITROPAN) 5 MG tablet; Take 1 tablet (5 mg total) by mouth 2 (two) times daily. -     Urinalysis, Routine w reflex microscopic; Future  Essential hypertension- BP is well controlled, lytes nd renal function are stable Orders: -     Basic metabolic panel; Future  Type II diabetes mellitus with manifestations - his blood sugars are adequately well controlled Orders: -     Microalbumin / creatinine urine ratio; Future -     Basic metabolic panel; Future -     Hemoglobin A1c; Future  B12 deficiency anemia Orders: -     CBC with Differential/Platelet; Future -     cyanocobalamin ((VITAMIN B-12)) injection 1,000 mcg; Inject 1 mL (1,000 mcg total) into the muscle once.   I have discontinued Mr. Struckman desoximetasone. I am also having him  maintain his aspirin, cetirizine, lisinopril, FLUoxetine, doxazosin, metFORMIN, atorvastatin, glipiZIDE, sitaGLIPtin, and oxybutynin. We administered cyanocobalamin.  Meds ordered this encounter  Medications  . oxybutynin (DITROPAN) 5 MG tablet    Sig: Take 1 tablet (5 mg total) by mouth 2 (two) times daily.    Dispense:  180 tablet    Refill:  3  . cyanocobalamin ((VITAMIN B-12)) injection 1,000 mcg    Sig:      Follow-up: Return in about 4 months (around 03/31/2015).  Scarlette Calico, MD

## 2014-11-28 NOTE — Progress Notes (Signed)
Pre visit review using our clinic review tool, if applicable. No additional management support is needed unless otherwise documented below in the visit note. 

## 2014-12-05 ENCOUNTER — Ambulatory Visit: Payer: Medicare Other

## 2015-01-02 ENCOUNTER — Ambulatory Visit: Payer: Medicare Other

## 2015-01-09 ENCOUNTER — Ambulatory Visit (INDEPENDENT_AMBULATORY_CARE_PROVIDER_SITE_OTHER): Payer: Medicare Other

## 2015-01-09 DIAGNOSIS — E538 Deficiency of other specified B group vitamins: Secondary | ICD-10-CM

## 2015-01-09 MED ORDER — CYANOCOBALAMIN 1000 MCG/ML IJ SOLN
1000.0000 ug | Freq: Once | INTRAMUSCULAR | Status: AC
  Administered 2015-01-09: 1000 ug via INTRAMUSCULAR

## 2015-02-06 ENCOUNTER — Ambulatory Visit (INDEPENDENT_AMBULATORY_CARE_PROVIDER_SITE_OTHER): Payer: Medicare Other

## 2015-02-06 ENCOUNTER — Ambulatory Visit: Payer: Medicare Other

## 2015-02-06 DIAGNOSIS — D519 Vitamin B12 deficiency anemia, unspecified: Secondary | ICD-10-CM

## 2015-02-06 MED ORDER — CYANOCOBALAMIN 1000 MCG/ML IJ SOLN
1000.0000 ug | Freq: Once | INTRAMUSCULAR | Status: AC
  Administered 2015-02-06: 1000 ug via SUBCUTANEOUS

## 2015-03-05 ENCOUNTER — Other Ambulatory Visit: Payer: Self-pay | Admitting: Internal Medicine

## 2015-03-06 ENCOUNTER — Other Ambulatory Visit (INDEPENDENT_AMBULATORY_CARE_PROVIDER_SITE_OTHER): Payer: Medicare Other

## 2015-03-06 ENCOUNTER — Ambulatory Visit (INDEPENDENT_AMBULATORY_CARE_PROVIDER_SITE_OTHER): Payer: Medicare Other

## 2015-03-06 ENCOUNTER — Encounter: Payer: Self-pay | Admitting: Internal Medicine

## 2015-03-06 DIAGNOSIS — E119 Type 2 diabetes mellitus without complications: Secondary | ICD-10-CM

## 2015-03-06 DIAGNOSIS — Z23 Encounter for immunization: Secondary | ICD-10-CM | POA: Diagnosis not present

## 2015-03-06 DIAGNOSIS — D649 Anemia, unspecified: Secondary | ICD-10-CM | POA: Diagnosis not present

## 2015-03-06 LAB — HEMOGLOBIN A1C: Hgb A1c MFr Bld: 7 % — ABNORMAL HIGH (ref 4.6–6.5)

## 2015-03-06 MED ORDER — CYANOCOBALAMIN 1000 MCG/ML IJ SOLN
1000.0000 ug | Freq: Once | INTRAMUSCULAR | Status: AC
  Administered 2015-03-06: 1000 ug via INTRAMUSCULAR

## 2015-03-26 LAB — HM DIABETES EYE EXAM

## 2015-04-05 LAB — HM DIABETES EYE EXAM

## 2015-04-10 ENCOUNTER — Ambulatory Visit (INDEPENDENT_AMBULATORY_CARE_PROVIDER_SITE_OTHER): Payer: Medicare Other

## 2015-04-10 DIAGNOSIS — E538 Deficiency of other specified B group vitamins: Secondary | ICD-10-CM | POA: Diagnosis not present

## 2015-04-10 MED ORDER — CYANOCOBALAMIN 1000 MCG/ML IJ SOLN
1000.0000 ug | Freq: Once | INTRAMUSCULAR | Status: AC
  Administered 2015-04-10: 1000 ug via INTRAMUSCULAR

## 2015-04-22 NOTE — Addendum Note (Signed)
Addended by: Janith Lima on: 04/22/2015 07:53 AM   Modules accepted: Miquel Dunn

## 2015-05-08 ENCOUNTER — Ambulatory Visit (INDEPENDENT_AMBULATORY_CARE_PROVIDER_SITE_OTHER): Payer: Medicare Other | Admitting: *Deleted

## 2015-05-08 DIAGNOSIS — E538 Deficiency of other specified B group vitamins: Secondary | ICD-10-CM

## 2015-05-08 MED ORDER — CYANOCOBALAMIN 1000 MCG/ML IJ SOLN
1000.0000 ug | Freq: Once | INTRAMUSCULAR | Status: AC
  Administered 2015-05-08: 1000 ug via INTRAMUSCULAR

## 2015-05-30 ENCOUNTER — Other Ambulatory Visit: Payer: Self-pay | Admitting: Internal Medicine

## 2015-06-12 ENCOUNTER — Ambulatory Visit (INDEPENDENT_AMBULATORY_CARE_PROVIDER_SITE_OTHER): Payer: Medicare Other

## 2015-06-12 DIAGNOSIS — E538 Deficiency of other specified B group vitamins: Secondary | ICD-10-CM

## 2015-06-12 MED ORDER — CYANOCOBALAMIN 1000 MCG/ML IJ SOLN
1000.0000 ug | Freq: Once | INTRAMUSCULAR | Status: AC
  Administered 2015-06-12: 1000 ug via INTRAMUSCULAR

## 2015-07-10 ENCOUNTER — Other Ambulatory Visit (INDEPENDENT_AMBULATORY_CARE_PROVIDER_SITE_OTHER): Payer: Medicare Other

## 2015-07-10 ENCOUNTER — Encounter: Payer: Self-pay | Admitting: Internal Medicine

## 2015-07-10 ENCOUNTER — Ambulatory Visit (INDEPENDENT_AMBULATORY_CARE_PROVIDER_SITE_OTHER): Payer: Medicare Other | Admitting: Internal Medicine

## 2015-07-10 VITALS — BP 150/80 | HR 72 | Temp 97.6°F | Resp 16 | Ht 70.0 in | Wt 213.0 lb

## 2015-07-10 DIAGNOSIS — N400 Enlarged prostate without lower urinary tract symptoms: Secondary | ICD-10-CM | POA: Diagnosis not present

## 2015-07-10 DIAGNOSIS — E118 Type 2 diabetes mellitus with unspecified complications: Secondary | ICD-10-CM | POA: Diagnosis not present

## 2015-07-10 DIAGNOSIS — B351 Tinea unguium: Secondary | ICD-10-CM

## 2015-07-10 DIAGNOSIS — E785 Hyperlipidemia, unspecified: Secondary | ICD-10-CM | POA: Diagnosis not present

## 2015-07-10 DIAGNOSIS — I10 Essential (primary) hypertension: Secondary | ICD-10-CM

## 2015-07-10 DIAGNOSIS — Z0000 Encounter for general adult medical examination without abnormal findings: Secondary | ICD-10-CM

## 2015-07-10 DIAGNOSIS — N3281 Overactive bladder: Secondary | ICD-10-CM

## 2015-07-10 DIAGNOSIS — D519 Vitamin B12 deficiency anemia, unspecified: Secondary | ICD-10-CM

## 2015-07-10 LAB — HEPATIC FUNCTION PANEL
ALT: 18 U/L (ref 0–53)
AST: 16 U/L (ref 0–37)
Albumin: 3.9 g/dL (ref 3.5–5.2)
Alkaline Phosphatase: 64 U/L (ref 39–117)
Bilirubin, Direct: 0.1 mg/dL (ref 0.0–0.3)
Total Bilirubin: 0.5 mg/dL (ref 0.2–1.2)
Total Protein: 6.4 g/dL (ref 6.0–8.3)

## 2015-07-10 LAB — BASIC METABOLIC PANEL
BUN: 22 mg/dL (ref 6–23)
CO2: 31 mEq/L (ref 19–32)
Calcium: 9.4 mg/dL (ref 8.4–10.5)
Chloride: 100 mEq/L (ref 96–112)
Creatinine, Ser: 1.04 mg/dL (ref 0.40–1.50)
GFR: 73.64 mL/min (ref >60.00)
Glucose, Bld: 167 mg/dL — ABNORMAL HIGH (ref 70–99)
Potassium: 4.9 mEq/L (ref 3.5–5.1)
Sodium: 139 mEq/L (ref 135–145)

## 2015-07-10 LAB — CBC WITH DIFFERENTIAL/PLATELET
Basophils Absolute: 0 10*3/uL (ref 0.0–0.1)
Basophils Relative: 0.7 % (ref 0.0–3.0)
Eosinophils Absolute: 0.4 10*3/uL (ref 0.0–0.7)
Eosinophils Relative: 5.8 % — ABNORMAL HIGH (ref 0.0–5.0)
HCT: 36.3 % — ABNORMAL LOW (ref 39.0–52.0)
Hemoglobin: 11.8 g/dL — ABNORMAL LOW (ref 13.0–17.0)
Lymphocytes Relative: 24.6 % (ref 12.0–46.0)
Lymphs Abs: 1.6 10*3/uL (ref 0.7–4.0)
MCHC: 32.6 g/dL (ref 30.0–36.0)
MCV: 84.2 fl (ref 78.0–100.0)
Monocytes Absolute: 0.6 10*3/uL (ref 0.1–1.0)
Monocytes Relative: 9.7 % (ref 3.0–12.0)
Neutro Abs: 3.7 10*3/uL (ref 1.4–7.7)
Neutrophils Relative %: 59.2 % (ref 43.0–77.0)
Platelets: 190 10*3/uL (ref 150.0–400.0)
RBC: 4.3 Mil/uL (ref 4.22–5.81)
RDW: 14.1 % (ref 11.5–15.5)
WBC: 6.3 10*3/uL (ref 4.0–10.5)

## 2015-07-10 LAB — LIPID PANEL
Cholesterol: 132 mg/dL (ref 0–200)
HDL: 54.7 mg/dL (ref >39.00)
LDL Cholesterol: 58 mg/dL (ref 0–99)
NonHDL: 77.3
Total CHOL/HDL Ratio: 2
Triglycerides: 95 mg/dL (ref 0.0–149.0)
VLDL: 19 mg/dL (ref 0.0–40.0)

## 2015-07-10 LAB — PSA: PSA: 1.08 ng/mL (ref 0.10–4.00)

## 2015-07-10 LAB — TSH: TSH: 1.1 u[IU]/mL (ref 0.35–4.50)

## 2015-07-10 LAB — HEMOGLOBIN A1C: Hgb A1c MFr Bld: 7.5 % — ABNORMAL HIGH (ref 4.6–6.5)

## 2015-07-10 MED ORDER — METFORMIN HCL 1000 MG PO TABS: 1000.0000 mg | ORAL_TABLET | Freq: Two times a day (BID) | ORAL | Status: DC

## 2015-07-10 MED ORDER — DOXAZOSIN MESYLATE 4 MG PO TABS: 2.0000 mg | ORAL_TABLET | Freq: Every day | ORAL | Status: DC

## 2015-07-10 MED ORDER — ATORVASTATIN CALCIUM 20 MG PO TABS: 10.0000 mg | ORAL_TABLET | Freq: Every day | ORAL | Status: DC

## 2015-07-10 MED ORDER — CYANOCOBALAMIN 1000 MCG/ML IJ SOLN
1000.0000 ug | Freq: Once | INTRAMUSCULAR | Status: AC
  Administered 2015-07-10: 1000 ug via INTRAMUSCULAR

## 2015-07-10 NOTE — Progress Notes (Signed)
Subjective:  Patient ID: Frank [name], male    DOB: 02-Jul-1941  Age: 74 y.o. MRN: [id number]  CC: Hyperlipidemia; Hypertension; Annual Exam; and Diabetes   HPI Frank [name] presents for a CPX - he is concerned about his right great toenail that has become discolored over the last few months. He offers no other new complaints.  Outpatient Prescriptions Prior to Visit  Medication Sig Dispense Refill  . aspirin 81 MG tablet Take 81 mg by mouth daily.      . cetirizine (ZYRTEC) 10 MG tablet Take 10 mg by mouth daily.    [name] FLUoxetine (PROZAC) 20 MG capsule Take 1 capsule by mouth  daily 90 capsule 3  . glipiZIDE (GLUCOTROL XL) 10 MG 24 hr tablet Take 1 tablet (10 mg total) by mouth daily. 90 tablet 4  . lisinopril (PRINIVIL,ZESTRIL) 5 MG tablet Take 1 tablet by mouth  daily 90 tablet 3  . oxybutynin (DITROPAN) 5 MG tablet Take 1 tablet (5 mg total) by mouth 2 (two) times daily. 180 tablet 3  . sitaGLIPtin (JANUVIA) 100 MG tablet Take 1 tablet (100 mg total) by mouth daily. 90 tablet 3  . atorvastatin (LIPITOR) 20 MG tablet Take 0.5 tablets (10 mg total) by mouth daily. 90 tablet 3  . doxazosin (CARDURA) 4 MG tablet Take 0.5 tablets (2 mg total) by mouth at bedtime. 90 tablet 3  . metFORMIN (GLUCOPHAGE) 1000 MG tablet Take 1 tablet (1,000 mg total) by mouth 2 (two) times daily. 180 tablet 4   No facility-administered medications prior to visit.    ROS Review of Systems  Constitutional: Positive for fatigue. Negative for fever, chills, diaphoresis, activity change, appetite change and unexpected weight change.  HENT: Negative.  Negative for congestion, sinus pressure and trouble swallowing.   Eyes: Negative.  Negative for visual disturbance.  Respiratory: Negative.  Negative for cough, choking, chest tightness, shortness of breath and stridor.   Cardiovascular: Negative.  Negative for chest pain, palpitations and leg swelling.  Gastrointestinal: Negative.  Negative for  nausea, vomiting, abdominal pain, diarrhea, constipation and blood in stool.  Endocrine: Negative.  Negative for polydipsia, polyphagia and polyuria.  Genitourinary: Negative.  Negative for dysuria, urgency, hematuria, difficulty urinating and testicular pain.  Musculoskeletal: Negative.  Negative for myalgias, back pain, arthralgias and neck pain.  Skin: Negative.  Negative for color change, pallor and rash.  Allergic/Immunologic: Negative.   Neurological: Negative.  Negative for dizziness, tremors, weakness, light-headedness, numbness and headaches.  Hematological: Negative.  Negative for adenopathy. Does not bruise/bleed easily.  Psychiatric/Behavioral: Negative.     Objective:  BP 150/80 mmHg  Pulse 72  Temp(Src) 97.6 F (36.4 C) (Oral)  Resp 16  Ht 5\' 10"  (1.778 m)  Wt 213 lb (96.616 kg)  BMI 30.56 kg/m2  SpO2 98%  BP Readings from Last 3 Encounters:  07/10/15 150/80  11/28/14 124/80  06/15/14 140/84    Wt Readings from Last 3 Encounters:  07/10/15 213 lb (96.616 kg)  11/28/14 212 lb (96.163 kg)  06/15/14 224 lb 4 oz (101.719 kg)    Physical Exam  Constitutional: He is oriented to person, place, and time. He appears well-developed and well-nourished. No distress.  HENT:  Head: Normocephalic and atraumatic.  Mouth/Throat: Oropharynx is clear and moist. No oropharyngeal exudate.  Eyes: Conjunctivae are normal. Right eye exhibits no discharge. Left eye exhibits no discharge. No scleral icterus.  Neck: Normal range of motion. Neck supple. No JVD present. No tracheal deviation present. No thyromegaly  present.  Cardiovascular: Normal rate, regular rhythm, normal heart sounds and intact distal pulses.  Exam reveals no gallop and no friction rub.   No murmur heard. Pulmonary/Chest: Effort normal and breath sounds normal. No stridor. No respiratory distress. He has no wheezes. He has no rales. He exhibits no tenderness.  Abdominal: Soft. Bowel sounds are normal. He exhibits no  distension and no mass. There is no tenderness. There is no rebound and no guarding.  Genitourinary:  GU and rectal exams were deferred at his request  Musculoskeletal: Normal range of motion. He exhibits no edema or tenderness.  Lymphadenopathy:    He has no cervical adenopathy.  Neurological: He is oriented to person, place, and time.  Skin: Skin is warm and dry. No rash noted. He is not diaphoretic. No erythema. No pallor.  All toenails show lysis and dystrophy but the right great toenail reveals brown/green subungual debris.  Psychiatric: He has a normal mood and affect. His behavior is normal. Judgment and thought content normal.  Vitals reviewed.   Lab Results  Component Value Date   WBC 6.3 07/10/2015   HGB 11.8* 07/10/2015   HCT 36.3* 07/10/2015   PLT 190.0 07/10/2015   GLUCOSE 167* 07/10/2015   CHOL 132 07/10/2015   TRIG 95.0 07/10/2015   HDL 54.70 07/10/2015   LDLCALC 58 07/10/2015   ALT 18 07/10/2015   AST 16 07/10/2015   NA 139 07/10/2015   K 4.9 07/10/2015   CL 100 07/10/2015   CREATININE 1.04 07/10/2015   BUN 22 07/10/2015   CO2 31 07/10/2015   TSH 1.10 07/10/2015   PSA 1.08 07/10/2015   HGBA1C 7.5* 07/10/2015   MICROALBUR 16.1* 11/28/2014    Ct Head Wo Contrast  02/09/2014  CLINICAL DATA:  Fall.  Abnormal gait. EXAM: CT HEAD WITHOUT CONTRAST TECHNIQUE: Contiguous axial images were obtained from the base of the skull through the vertex without intravenous contrast. COMPARISON:  None. FINDINGS: No mass. No hydrocephalus. No hemorrhage. Diffuse cerebral atrophy. No hydrocephalus. Posterior fossa unremarkable. No acute bony abnormality. Mild mucosal thickening and ethmoidal sinuses. IMPRESSION: No acute intracranial abnormality. Electronically Signed   By: Marcello Moores  Register   On: 02/09/2014 11:31    Assessment & Plan:   Frank [name] was seen today for hyperlipidemia, hypertension, annual exam and diabetes.  Diagnoses and all orders for this visit:  Essential  hypertension- his blood pressure is well-controlled, lites and renal function are stable. -     doxazosin (CARDURA) 4 MG tablet; Take 0.5 tablets (2 mg total) by mouth at bedtime. -     Basic metabolic panel; Future  Type 2 diabetes mellitus with complication, without long-term current use of insulin ([place])- his A1c is up to 7.5% which is still relatively good control for his age, I do not think I should make any changes in medications at this time. He will continue to work on his lifestyle modifications. -     metFORMIN (GLUCOPHAGE) 1000 MG tablet; Take 1 tablet (1,000 mg total) by mouth 2 (two) times daily. -     atorvastatin (LIPITOR) 20 MG tablet; Take 0.5 tablets (10 mg total) by mouth daily. -     Basic metabolic panel; Future -     Hemoglobin A1c; Future -     Hepatic function panel; Future  B12 deficiency anemia- hemoglobin and hematocrit are stable, will continue monthly B12 injections -     CBC with Differential/Platelet; Future -     cyanocobalamin ((VITAMIN B-12)) injection 1,000 mcg;  Inject 1 mL (1,000 mcg total) into the muscle once.  Hyperlipidemia with target LDL less than 100- he is achieved his LDL goal is doing well on the statin. -     atorvastatin (LIPITOR) 20 MG tablet; Take 0.5 tablets (10 mg total) by mouth daily. -     Lipid panel; Future -     TSH; Future -     Hepatic function panel; Future  BPH (benign prostatic hyperplasia)- his PSA remains low so I'm not concerned about prostate cancer, symptoms are well controlled with Cardura. -     doxazosin (CARDURA) 4 MG tablet; Take 0.5 tablets (2 mg total) by mouth at bedtime. -     PSA; Future  OAB (overactive bladder)  Onychomycosis of right great toe -     Ambulatory referral to Podiatry  I am having Mr. Stoessel maintain his aspirin, cetirizine, glipiZIDE, sitaGLIPtin, oxybutynin, FLUoxetine, lisinopril, metFORMIN, doxazosin, atorvastatin, and gatifloxacin. We administered cyanocobalamin.  Meds ordered this  encounter  Medications  . metFORMIN (GLUCOPHAGE) 1000 MG tablet    Sig: Take 1 tablet (1,000 mg total) by mouth 2 (two) times daily.    Dispense:  180 tablet    Refill:  4  . doxazosin (CARDURA) 4 MG tablet    Sig: Take 0.5 tablets (2 mg total) by mouth at bedtime.    Dispense:  90 tablet    Refill:  3  . atorvastatin (LIPITOR) 20 MG tablet    Sig: Take 0.5 tablets (10 mg total) by mouth daily.    Dispense:  90 tablet    Refill:  3    Generic [id number] 20MG  Generic [id number] 20MG   . gatifloxacin (ZYMAXID) 0.5 % SOLN    Sig:   . cyanocobalamin ((VITAMIN B-12)) injection 1,000 mcg    Sig:    See AVS for instructions about healthy living and anticipatory guidance.  Follow-up: Return in about 4 months (around 11/08/2015).  Scarlette Calico, MD

## 2015-07-10 NOTE — Patient Instructions (Signed)

## 2015-07-10 NOTE — Assessment & Plan Note (Signed)

## 2015-07-10 NOTE — Progress Notes (Signed)
Pre visit review using our clinic review tool, if applicable. No additional management support is needed unless otherwise documented below in the visit note. 

## 2015-07-22 ENCOUNTER — Encounter: Payer: Self-pay | Admitting: Podiatry

## 2015-07-22 ENCOUNTER — Ambulatory Visit (INDEPENDENT_AMBULATORY_CARE_PROVIDER_SITE_OTHER): Payer: Medicare Other | Admitting: Podiatry

## 2015-07-22 ENCOUNTER — Ambulatory Visit: Payer: Self-pay | Admitting: Podiatry

## 2015-07-22 VITALS — BP 141/75 | HR 89 | Resp 16 | Ht 70.0 in | Wt 213.0 lb

## 2015-07-22 DIAGNOSIS — M79674 Pain in right toe(s): Secondary | ICD-10-CM

## 2015-07-22 DIAGNOSIS — Q828 Other specified congenital malformations of skin: Secondary | ICD-10-CM

## 2015-07-22 DIAGNOSIS — M79605 Pain in left leg: Secondary | ICD-10-CM

## 2015-07-22 DIAGNOSIS — M79675 Pain in left toe(s): Secondary | ICD-10-CM

## 2015-07-22 DIAGNOSIS — E1151 Type 2 diabetes mellitus with diabetic peripheral angiopathy without gangrene: Secondary | ICD-10-CM

## 2015-07-22 DIAGNOSIS — M79604 Pain in right leg: Secondary | ICD-10-CM

## 2015-07-22 DIAGNOSIS — B351 Tinea unguium: Secondary | ICD-10-CM

## 2015-07-22 NOTE — Progress Notes (Signed)
   Subjective:    Patient ID: Frank [name], male    DOB: [birth year], 75 y.o.   MRN: [id number]  HPI Patient presents with bilateral nail problem; nail discoloration & thickened nails. All nails need to be checked. Pt has used vick's vapor rub on toes with no relief.    Review of Systems  All other systems reviewed and are negative.      Objective:   Physical Exam        Assessment & Plan:

## 2015-07-22 NOTE — Progress Notes (Signed)
Subjective:     Patient ID: Frank [name], male   DOB: 29-Oct-1940, 75 y.o.   MRN: [id number]  HPI patient presents after not being seen for a number of years with significant nail disease and lesion formation of both feet with the nails been painful and lesions being painful at times   Review of Systems  All other systems reviewed and are negative.      Objective:   Physical Exam  Constitutional: He is oriented to person, place, and time.  Cardiovascular: Intact distal pulses.   Musculoskeletal: Normal range of motion.  Neurological: He is oriented to person, place, and time.  Skin: Skin is warm and dry.  Nursing note and vitals reviewed.   neurovascular status  Present but mildly diminished both PT and DP pulses with patient noted to have mild DTR reflexes reduction. Patient's found to have thick keratotic lesions first metatarsal bilateral with also hallux involved and thick yellow brittle nailbeds 1-5 both feet that are painful when pressed    Assessment:      mycotic nail infection with lesion formation and pain    Plan:      H&P and diabetic education rendered to patient. Debrided nailbeds 1-5 both feet today and lesions on both feet with no iatrogenic bleeding and reappoint to recheck

## 2015-08-15 ENCOUNTER — Ambulatory Visit (INDEPENDENT_AMBULATORY_CARE_PROVIDER_SITE_OTHER): Payer: Medicare Other | Admitting: General Practice

## 2015-08-15 DIAGNOSIS — E539 Vitamin B deficiency, unspecified: Secondary | ICD-10-CM | POA: Diagnosis not present

## 2015-08-15 MED ORDER — CYANOCOBALAMIN 1000 MCG/ML IJ SOLN
1000.0000 ug | Freq: Once | INTRAMUSCULAR | Status: AC
  Administered 2015-08-15: 1000 ug via INTRAMUSCULAR

## 2015-08-19 ENCOUNTER — Other Ambulatory Visit: Payer: Self-pay | Admitting: Internal Medicine

## 2015-10-02 ENCOUNTER — Ambulatory Visit (INDEPENDENT_AMBULATORY_CARE_PROVIDER_SITE_OTHER): Payer: Medicare Other

## 2015-10-02 DIAGNOSIS — E538 Deficiency of other specified B group vitamins: Secondary | ICD-10-CM

## 2015-10-02 MED ORDER — CYANOCOBALAMIN 1000 MCG/ML IJ SOLN
1000.0000 ug | Freq: Once | INTRAMUSCULAR | Status: AC
  Administered 2015-10-02: 1000 ug via INTRAMUSCULAR

## 2015-10-23 ENCOUNTER — Ambulatory Visit: Payer: Medicare Other | Admitting: Podiatry

## 2015-10-31 ENCOUNTER — Encounter: Payer: Self-pay | Admitting: Internal Medicine

## 2015-10-31 ENCOUNTER — Ambulatory Visit (INDEPENDENT_AMBULATORY_CARE_PROVIDER_SITE_OTHER): Payer: Medicare Other | Admitting: Internal Medicine

## 2015-10-31 VITALS — BP 120/60 | HR 77 | Temp 98.1°F | Resp 16 | Ht 70.0 in | Wt 213.0 lb

## 2015-10-31 DIAGNOSIS — Z794 Long term (current) use of insulin: Secondary | ICD-10-CM

## 2015-10-31 DIAGNOSIS — E118 Type 2 diabetes mellitus with unspecified complications: Secondary | ICD-10-CM

## 2015-10-31 DIAGNOSIS — I10 Essential (primary) hypertension: Secondary | ICD-10-CM

## 2015-10-31 DIAGNOSIS — R05 Cough: Secondary | ICD-10-CM

## 2015-10-31 DIAGNOSIS — H6592 Unspecified nonsuppurative otitis media, left ear: Secondary | ICD-10-CM | POA: Diagnosis not present

## 2015-10-31 DIAGNOSIS — H6690 Otitis media, unspecified, unspecified ear: Secondary | ICD-10-CM | POA: Insufficient documentation

## 2015-10-31 DIAGNOSIS — R059 Cough, unspecified: Secondary | ICD-10-CM | POA: Insufficient documentation

## 2015-10-31 MED ORDER — TELMISARTAN 40 MG PO TABS: 40.0000 mg | ORAL_TABLET | Freq: Every day | ORAL | Status: DC

## 2015-10-31 MED ORDER — CEFDINIR 300 MG PO CAPS: 300.0000 mg | ORAL_CAPSULE | Freq: Two times a day (BID) | ORAL | Status: DC

## 2015-10-31 MED ORDER — CEFDINIR 300 MG PO CAPS: 300.0000 mg | ORAL_CAPSULE | Freq: Two times a day (BID) | ORAL | Status: AC

## 2015-10-31 NOTE — Patient Instructions (Signed)
Otitis Media With Effusion Otitis media with effusion is the presence of fluid in the middle ear. This is a common problem in children, which often follows ear infections. It may be present for weeks or longer after the infection. Unlike an acute ear infection, otitis media with effusion refers only to fluid behind the ear drum and not infection. Children with repeated ear and sinus infections and allergy problems are the most likely to get otitis media with effusion. CAUSES  The most frequent cause of the fluid buildup is dysfunction of the eustachian tubes. These are the tubes that drain fluid in the ears to the back of the nose (nasopharynx). SYMPTOMS   The main symptom of this condition is hearing loss. As a result, you or your child may:  Listen to the TV at a loud volume.  Not respond to questions.  Ask "what" often when spoken to.  Mistake or confuse one sound or word for another.  There may be a sensation of fullness or pressure but usually not pain. DIAGNOSIS   Your health care provider will diagnose this condition by examining you or your child's ears.  Your health care provider may test the pressure in you or your child's ear with a tympanometer.  A hearing test may be conducted if the problem persists. TREATMENT   Treatment depends on the duration and the effects of the effusion.  Antibiotics, decongestants, nose drops, and cortisone-type drugs (tablets or nasal spray) may not be helpful.  Children with persistent ear effusions may have delayed language or behavioral problems. Children at risk for developmental delays in hearing, learning, and speech may require referral to a specialist earlier than children not at risk.  You or your child's health care provider may suggest a referral to an ear, nose, and throat surgeon for treatment. The following may help restore normal hearing:  Drainage of fluid.  Placement of ear tubes (tympanostomy tubes).  Removal of adenoids  (adenoidectomy). HOME CARE INSTRUCTIONS   Avoid secondhand smoke.  Infants who are breastfed are less likely to have this condition.  Avoid feeding infants while they are lying flat.  Avoid known environmental allergens.  Avoid people who are sick. SEEK MEDICAL CARE IF:   Hearing is not better in 3 months.  Hearing is worse.  Ear pain.  Drainage from the ear.  Dizziness. MAKE SURE YOU:   Understand these instructions.  Will watch your condition.  Will get help right away if you are not doing well or get worse.   This information is not intended to replace advice given to you by your health care provider. Make sure you discuss any questions you have with your health care provider.   Document Released: 08/06/2004 Document Revised: 07/20/2014 Document Reviewed: 01/24/2013 Elsevier Interactive Patient Education 2016 [employer]  

## 2015-10-31 NOTE — Progress Notes (Signed)
Subjective:  Patient ID: Frank [name], male    DOB: 08-Sep-1940  Age: 75 y.o. MRN: [id number]  CC: Cough and Otalgia   HPI Frank [name] presents for cough and earache.  He complains of a chronic cough over the last few months that was nonproductive until about 2 weeks ago when he started coughing up yellow/green phlegm. He denies hemoptysis, chest pain, shortness of breath, night sweats, fever, or chills.  He also complains of left-sided earache for about 4 days. He describes a sensation of loss of hearing but has had no dizziness or vertigo.  Outpatient Prescriptions Prior to Visit  Medication Sig Dispense Refill  . aspirin 81 MG tablet Take 81 mg by mouth daily.      [name] atorvastatin (LIPITOR) 20 MG tablet Take 0.5 tablets (10 mg total) by mouth daily. 90 tablet 3  . cetirizine (ZYRTEC) 10 MG tablet Take 10 mg by mouth daily.    [name] doxazosin (CARDURA) 4 MG tablet Take 0.5 tablets (2 mg total) by mouth at bedtime. 90 tablet 3  . FLUoxetine (PROZAC) 20 MG capsule Take 1 capsule by mouth  daily 90 capsule 3  . glipiZIDE (GLUCOTROL XL) 10 MG 24 hr tablet Take 1 tablet by mouth  daily 90 tablet 1  . metFORMIN (GLUCOPHAGE) 1000 MG tablet Take 1 tablet (1,000 mg total) by mouth 2 (two) times daily. 180 tablet 4  . oxybutynin (DITROPAN) 5 MG tablet Take 1 tablet by mouth two  times daily 180 tablet 1  . sitaGLIPtin (JANUVIA) 100 MG tablet Take 1 tablet (100 mg total) by mouth daily. 90 tablet 3  . gatifloxacin (ZYMAXID) 0.5 % SOLN     . lisinopril (PRINIVIL,ZESTRIL) 5 MG tablet Take 1 tablet by mouth  daily 90 tablet 3   No facility-administered medications prior to visit.    ROS Review of Systems  Constitutional: Negative.  Negative for fever, chills, diaphoresis, appetite change and fatigue.  HENT: Positive for ear pain and hearing loss. Negative for rhinorrhea, sinus pressure, sore throat, tinnitus, trouble swallowing and voice change.   Eyes: Negative.   Respiratory:  Positive for cough. Negative for apnea, choking, chest tightness, shortness of breath, wheezing and stridor.   Cardiovascular: Negative.  Negative for chest pain, palpitations and leg swelling.  Gastrointestinal: Negative.  Negative for nausea, vomiting, abdominal pain, diarrhea and constipation.  Endocrine: Negative.   Genitourinary: Negative.   Musculoskeletal: Negative.  Negative for myalgias, back pain, arthralgias and neck pain.  Skin: Negative.  Negative for color change and rash.  Allergic/Immunologic: Negative.   Neurological: Negative.  Negative for dizziness, tremors, weakness and numbness.  Hematological: Negative.  Negative for adenopathy. Does not bruise/bleed easily.  Psychiatric/Behavioral: Negative.     Objective:  BP 120/60 mmHg  Pulse 77  Temp(Src) 98.1 F (36.7 C) (Oral)  Resp 16  Ht 5\' 10"  (1.778 m)  Wt 213 lb (96.616 kg)  BMI 30.56 kg/m2  SpO2 93%  BP Readings from Last 3 Encounters:  10/31/15 120/60  07/22/15 141/75  07/10/15 150/80    Wt Readings from Last 3 Encounters:  10/31/15 213 lb (96.616 kg)  07/22/15 213 lb (96.616 kg)  07/10/15 213 lb (96.616 kg)    Physical Exam  Constitutional: He is oriented to person, place, and time. No distress.  HENT:  Right Ear: Hearing, tympanic membrane, external ear and ear canal normal.  Left Ear: Hearing, external ear and ear canal normal. No swelling. Tympanic membrane is injected, erythematous and bulging.  Tympanic membrane is not perforated. A middle ear effusion is present.  Mouth/Throat: Oropharynx is clear and moist. No oropharyngeal exudate.  Eyes: Conjunctivae are normal. Right eye exhibits no discharge. Left eye exhibits no discharge. No scleral icterus.  Neck: Normal range of motion. Neck supple. No JVD present. No tracheal deviation present. No thyromegaly present.  Cardiovascular: Normal rate, regular rhythm, normal heart sounds and intact distal pulses.  Exam reveals no gallop and no friction rub.     No murmur heard. Pulmonary/Chest: Effort normal and breath sounds normal. No stridor. No respiratory distress. He has no wheezes. He has no rales. He exhibits no tenderness.  Abdominal: Soft. Bowel sounds are normal. He exhibits no distension and no mass. There is no tenderness. There is no rebound and no guarding.  Musculoskeletal: Normal range of motion. He exhibits no edema.  Lymphadenopathy:    He has no cervical adenopathy.  Neurological: He is oriented to person, place, and time.  Skin: Skin is warm and dry. No rash noted. He is not diaphoretic. No erythema. No pallor.  Vitals reviewed.   Lab Results  Component Value Date   WBC 6.3 07/10/2015   HGB 11.8* 07/10/2015   HCT 36.3* 07/10/2015   PLT 190.0 07/10/2015   GLUCOSE 167* 07/10/2015   CHOL 132 07/10/2015   TRIG 95.0 07/10/2015   HDL 54.70 07/10/2015   LDLCALC 58 07/10/2015   ALT 18 07/10/2015   AST 16 07/10/2015   NA 139 07/10/2015   K 4.9 07/10/2015   CL 100 07/10/2015   CREATININE 1.04 07/10/2015   BUN 22 07/10/2015   CO2 31 07/10/2015   TSH 1.10 07/10/2015   PSA 1.08 07/10/2015   HGBA1C 7.5* 07/10/2015   MICROALBUR 16.1* 11/28/2014    Ct Head Wo Contrast  02/09/2014  CLINICAL DATA:  Fall.  Abnormal gait. EXAM: CT HEAD WITHOUT CONTRAST TECHNIQUE: Contiguous axial images were obtained from the base of the skull through the vertex without intravenous contrast. COMPARISON:  None. FINDINGS: No mass. No hydrocephalus. No hemorrhage. Diffuse cerebral atrophy. No hydrocephalus. Posterior fossa unremarkable. No acute bony abnormality. Mild mucosal thickening and ethmoidal sinuses. IMPRESSION: No acute intracranial abnormality. Electronically Signed   By: Marcello Moores  Register   On: 02/09/2014 11:31    Assessment & Plan:    I have discontinued Mr. Zarling lisinopril and gatifloxacin. I am also having him start on telmisartan. Additionally, I am having him maintain his aspirin, cetirizine, sitaGLIPtin, FLUoxetine,  metFORMIN, doxazosin, atorvastatin, oxybutynin, glipiZIDE, and cefdinir.  Meds ordered this encounter  Medications  . telmisartan (MICARDIS) 40 MG tablet    Sig: Take 1 tablet (40 mg total) by mouth daily.    Dispense:  90 tablet    Refill:  1  . DISCONTD: cefdinir (OMNICEF) 300 MG capsule    Sig: Take 1 capsule (300 mg total) by mouth 2 (two) times daily.    Dispense:  20 capsule    Refill:  1  . cefdinir (OMNICEF) 300 MG capsule    Sig: Take 1 capsule (300 mg total) by mouth 2 (two) times daily.    Dispense:  20 capsule    Refill:  1     Follow-up: Return in about 3 weeks (around 11/21/2015).  Scarlette Calico, MD

## 2015-10-31 NOTE — Progress Notes (Signed)
Pre visit review using our clinic review tool, if applicable. No additional management support is needed unless otherwise documented below in the visit note. 

## 2015-11-02 NOTE — Assessment & Plan Note (Signed)
I will treat this with [facility]

## 2015-11-02 NOTE — Assessment & Plan Note (Signed)
Due to his complaints about cough will stop the ACE inhibitor and will change into an ARB.

## 2015-11-02 NOTE — Assessment & Plan Note (Signed)
I'm concerned his cough might be caused by the ACE inhibitor so I have asked him to stop taking it. Will switch him to an ARB. Will also treat him for respiratory infection with a course of Omnicef.

## 2015-11-06 ENCOUNTER — Ambulatory Visit (INDEPENDENT_AMBULATORY_CARE_PROVIDER_SITE_OTHER): Payer: Medicare Other

## 2015-11-06 ENCOUNTER — Other Ambulatory Visit: Payer: Medicare Other

## 2015-11-06 DIAGNOSIS — E538 Deficiency of other specified B group vitamins: Secondary | ICD-10-CM

## 2015-11-06 MED ORDER — CYANOCOBALAMIN 1000 MCG/ML IJ SOLN
1000.0000 ug | Freq: Once | INTRAMUSCULAR | Status: AC
  Administered 2015-11-06: 1000 ug via INTRAMUSCULAR

## 2015-11-21 ENCOUNTER — Telehealth: Payer: Self-pay | Admitting: *Deleted

## 2015-11-21 DIAGNOSIS — Z794 Long term (current) use of insulin: Secondary | ICD-10-CM

## 2015-11-21 DIAGNOSIS — D519 Vitamin B12 deficiency anemia, unspecified: Secondary | ICD-10-CM

## 2015-11-21 DIAGNOSIS — E785 Hyperlipidemia, unspecified: Secondary | ICD-10-CM

## 2015-11-21 DIAGNOSIS — E118 Type 2 diabetes mellitus with unspecified complications: Secondary | ICD-10-CM

## 2015-11-21 DIAGNOSIS — I10 Essential (primary) hypertension: Secondary | ICD-10-CM

## 2015-11-21 NOTE — Telephone Encounter (Signed)
Left msg on triage Wed afternoon stating coming on 12/03/15 for B12 injection wanting to have labs drawn prior to check levels. MD is out office until "15" will hold until he returns....Frank [name]

## 2015-11-24 NOTE — Telephone Encounter (Signed)
Labs ordered.

## 2015-11-25 NOTE — Telephone Encounter (Signed)
Notified pt labs has been order.../.lmb

## 2015-12-03 ENCOUNTER — Encounter: Payer: Self-pay | Admitting: Internal Medicine

## 2015-12-03 ENCOUNTER — Ambulatory Visit (INDEPENDENT_AMBULATORY_CARE_PROVIDER_SITE_OTHER): Payer: Medicare Other

## 2015-12-03 ENCOUNTER — Other Ambulatory Visit (INDEPENDENT_AMBULATORY_CARE_PROVIDER_SITE_OTHER): Payer: Medicare Other

## 2015-12-03 DIAGNOSIS — I10 Essential (primary) hypertension: Secondary | ICD-10-CM | POA: Diagnosis not present

## 2015-12-03 DIAGNOSIS — Z794 Long term (current) use of insulin: Secondary | ICD-10-CM | POA: Diagnosis not present

## 2015-12-03 DIAGNOSIS — E785 Hyperlipidemia, unspecified: Secondary | ICD-10-CM | POA: Diagnosis not present

## 2015-12-03 DIAGNOSIS — E118 Type 2 diabetes mellitus with unspecified complications: Secondary | ICD-10-CM

## 2015-12-03 DIAGNOSIS — D519 Vitamin B12 deficiency anemia, unspecified: Secondary | ICD-10-CM

## 2015-12-03 LAB — CBC WITH DIFFERENTIAL/PLATELET
Basophils Absolute: 0 10*3/uL (ref 0.0–0.1)
Basophils Relative: 0.6 % (ref 0.0–3.0)
Eosinophils Absolute: 0.4 10*3/uL (ref 0.0–0.7)
Eosinophils Relative: 6.4 % — ABNORMAL HIGH (ref 0.0–5.0)
HCT: 35.9 % — ABNORMAL LOW (ref 39.0–52.0)
Hemoglobin: 11.7 g/dL — ABNORMAL LOW (ref 13.0–17.0)
Lymphocytes Relative: 23.6 % (ref 12.0–46.0)
Lymphs Abs: 1.5 10*3/uL (ref 0.7–4.0)
MCHC: 32.5 g/dL (ref 30.0–36.0)
MCV: 82.3 fl (ref 78.0–100.0)
Monocytes Absolute: 0.6 10*3/uL (ref 0.1–1.0)
Monocytes Relative: 10.3 % (ref 3.0–12.0)
Neutro Abs: 3.7 10*3/uL (ref 1.4–7.7)
Neutrophils Relative %: 59.1 % (ref 43.0–77.0)
Platelets: 186 10*3/uL (ref 150.0–400.0)
RBC: 4.36 Mil/uL (ref 4.22–5.81)
RDW: 14.1 % (ref 11.5–15.5)
WBC: 6.2 10*3/uL (ref 4.0–10.5)

## 2015-12-03 LAB — URINALYSIS, ROUTINE W REFLEX MICROSCOPIC
Bilirubin Urine: NEGATIVE
Hgb urine dipstick: NEGATIVE
Ketones, ur: NEGATIVE
Leukocytes, UA: NEGATIVE
Nitrite: NEGATIVE
RBC / HPF: NONE SEEN (ref 0–?)
Specific Gravity, Urine: 1.015 (ref 1.000–1.030)
Total Protein, Urine: 30 — AB
Urine Glucose: NEGATIVE
Urobilinogen, UA: 0.2 (ref 0.0–1.0)
WBC, UA: NONE SEEN (ref 0–?)
pH: 6 (ref 5.0–8.0)

## 2015-12-03 LAB — COMPREHENSIVE METABOLIC PANEL
ALT: 17 U/L (ref 0–53)
AST: 15 U/L (ref 0–37)
Albumin: 4 g/dL (ref 3.5–5.2)
Alkaline Phosphatase: 55 U/L (ref 39–117)
BUN: 19 mg/dL (ref 6–23)
CO2: 29 mEq/L (ref 19–32)
Calcium: 9.3 mg/dL (ref 8.4–10.5)
Chloride: 103 mEq/L (ref 96–112)
Creatinine, Ser: 0.97 mg/dL (ref 0.40–1.50)
GFR: 79.72 mL/min (ref >60.00)
Glucose, Bld: 165 mg/dL — ABNORMAL HIGH (ref 70–99)
Potassium: 4.2 mEq/L (ref 3.5–5.1)
Sodium: 138 mEq/L (ref 135–145)
Total Bilirubin: 0.4 mg/dL (ref 0.2–1.2)
Total Protein: 6.6 g/dL (ref 6.0–8.3)

## 2015-12-03 LAB — LIPID PANEL
Cholesterol: 132 mg/dL (ref 0–200)
HDL: 49.7 mg/dL (ref >39.00)
LDL Cholesterol: 62 mg/dL (ref 0–99)
NonHDL: 82.18
Total CHOL/HDL Ratio: 3
Triglycerides: 99 mg/dL (ref 0.0–149.0)
VLDL: 19.8 mg/dL (ref 0.0–40.0)

## 2015-12-03 LAB — MICROALBUMIN / CREATININE URINE RATIO
Creatinine,U: 73.9 mg/dL
Microalb Creat Ratio: 23.7 mg/g (ref 0.0–30.0)
Microalb, Ur: 17.5 mg/dL — ABNORMAL HIGH (ref 0.0–1.9)

## 2015-12-03 LAB — HEMOGLOBIN A1C: Hgb A1c MFr Bld: 7.8 % — ABNORMAL HIGH (ref 4.6–6.5)

## 2015-12-03 MED ORDER — CYANOCOBALAMIN 1000 MCG/ML IJ SOLN
1000.0000 ug | Freq: Once | INTRAMUSCULAR | Status: AC
  Administered 2015-12-03: 1000 ug via INTRAMUSCULAR

## 2015-12-04 ENCOUNTER — Ambulatory Visit: Payer: Medicare Other

## 2015-12-04 ENCOUNTER — Other Ambulatory Visit: Payer: Self-pay | Admitting: Internal Medicine

## 2015-12-11 ENCOUNTER — Ambulatory Visit (INDEPENDENT_AMBULATORY_CARE_PROVIDER_SITE_OTHER)
Admission: RE | Admit: 2015-12-11 | Discharge: 2015-12-11 | Disposition: A | Discharge status: Home / Self Care | Payer: Medicare Other | Source: Ambulatory Visit | Attending: Internal Medicine | Admitting: Internal Medicine

## 2015-12-11 ENCOUNTER — Encounter: Payer: Self-pay | Admitting: Internal Medicine

## 2015-12-11 ENCOUNTER — Ambulatory Visit (INDEPENDENT_AMBULATORY_CARE_PROVIDER_SITE_OTHER): Payer: Medicare Other | Admitting: Internal Medicine

## 2015-12-11 VITALS — BP 140/76 | HR 77 | Temp 98.5°F | Resp 16 | Ht 70.0 in | Wt 215.0 lb

## 2015-12-11 DIAGNOSIS — R2232 Localized swelling, mass and lump, left upper limb: Secondary | ICD-10-CM

## 2015-12-11 DIAGNOSIS — N3281 Overactive bladder: Secondary | ICD-10-CM

## 2015-12-11 DIAGNOSIS — I10 Essential (primary) hypertension: Secondary | ICD-10-CM

## 2015-12-11 MED ORDER — MIRABEGRON ER 25 MG PO TB24: 25.0000 mg | ORAL_TABLET | Freq: Every day | ORAL | Status: DC

## 2015-12-11 NOTE — Progress Notes (Signed)
Subjective:  Patient ID: Frank [name], male    DOB: [birth year]  Age: 75 y.o. MRN: [id number]  CC: Hypertension   HPI Frank [name] presents for ablood pressure check, he complains that there is a growth on the dorsum of his left hand that has been there for about 2 or 3 weeks. He can't recall any trauma or injury. The area does not bother him and he has not noticed any bruising, redness, drainage, or streaking.  He also complains of persistent urinary urgency and urinary leakage. He has not gotten much symptom relief with oxybutynin. He has nocturia about 1 or 2 times per night.  He had to discontinue the ACE inhibitor due to coughing and has been changed to an ARB. He tells me the cough has resolved and his blood pressure has been well controlled.  Outpatient Prescriptions Prior to Visit  Medication Sig Dispense Refill  . aspirin 81 MG tablet Take 81 mg by mouth daily.      [name] atorvastatin (LIPITOR) 20 MG tablet Take 0.5 tablets (10 mg total) by mouth daily. 90 tablet 3  . cetirizine (ZYRTEC) 10 MG tablet Take 10 mg by mouth daily.    [name] doxazosin (CARDURA) 4 MG tablet Take 0.5 tablets (2 mg total) by mouth at bedtime. 90 tablet 3  . FLUoxetine (PROZAC) 20 MG capsule Take 1 capsule by mouth  daily 90 capsule 3  . glipiZIDE (GLUCOTROL XL) 10 MG 24 hr tablet Take 1 tablet by mouth  daily 90 tablet 1  . JANUVIA 100 MG tablet TAKE ONE TABLET BY MOUTH ONCE DAILY 90 tablet 1  . metFORMIN (GLUCOPHAGE) 1000 MG tablet Take 1 tablet (1,000 mg total) by mouth 2 (two) times daily. 180 tablet 4  . oxybutynin (DITROPAN) 5 MG tablet Take 1 tablet by mouth two  times daily 180 tablet 1  . telmisartan (MICARDIS) 40 MG tablet Take 1 tablet (40 mg total) by mouth daily. 90 tablet 1   No facility-administered medications prior to visit.    ROS Review of Systems  Constitutional: Negative.  Negative for fever, chills, diaphoresis, appetite change and fatigue.  HENT: Negative.   Eyes:  Negative.  Negative for visual disturbance.  Respiratory: Negative.  Negative for cough, choking, chest tightness, shortness of breath and stridor.   Cardiovascular: Negative.  Negative for chest pain, palpitations and leg swelling.  Gastrointestinal: Negative.  Negative for nausea, vomiting, abdominal pain, diarrhea and constipation.  Endocrine: Negative.   Genitourinary: Positive for urgency. Negative for frequency, hematuria, decreased urine volume and difficulty urinating.  Musculoskeletal: Negative.  Negative for myalgias, back pain, joint swelling and arthralgias.  Skin: Negative.  Negative for color change and rash.  Allergic/Immunologic: Negative.   Neurological: Negative.  Negative for dizziness, tremors, weakness, numbness and headaches.  Hematological: Negative.  Negative for adenopathy. Does not bruise/bleed easily.  Psychiatric/Behavioral: Negative.     Objective:  BP 140/76 mmHg  Pulse 77  Temp(Src) 98.5 F (36.9 C) (Oral)  Resp 16  Ht 5\' 10"  (1.778 m)  Wt 215 lb (97.523 kg)  BMI 30.85 kg/m2  SpO2 95%  BP Readings from Last 3 Encounters:  12/11/15 140/76  10/31/15 120/60  07/22/15 141/75    Wt Readings from Last 3 Encounters:  12/11/15 215 lb (97.523 kg)  10/31/15 213 lb (96.616 kg)  07/22/15 213 lb (96.616 kg)    Physical Exam  Constitutional: He is oriented to person, place, and time. He appears well-developed and well-nourished. No distress.  HENT:  Mouth/Throat: Oropharynx is clear and moist. No oropharyngeal exudate.  Eyes: Conjunctivae are normal. Right eye exhibits no discharge. Left eye exhibits no discharge. No scleral icterus.  Neck: Normal range of motion. Neck supple. No JVD present. No tracheal deviation present. No thyromegaly present.  Cardiovascular: Normal rate, regular rhythm, normal heart sounds and intact distal pulses.  Exam reveals no gallop and no friction rub.   No murmur heard. Pulmonary/Chest: Effort normal and breath sounds normal.  No stridor. No respiratory distress. He has no wheezes. He has no rales. He exhibits no tenderness.  Abdominal: Soft. Bowel sounds are normal. He exhibits no distension and no mass. There is no tenderness. There is no rebound and no guarding.  Musculoskeletal: Normal range of motion. He exhibits no edema or tenderness.       Left hand: He exhibits deformity. He exhibits normal range of motion, no tenderness, no bony tenderness, normal capillary refill, no laceration and no swelling.       Hands: Lymphadenopathy:    He has no cervical adenopathy.  Neurological: He is oriented to person, place, and time.  Skin: Skin is warm and dry. No rash noted. He is not diaphoretic. No erythema. No pallor.  Vitals reviewed.   Lab Results  Component Value Date   WBC 6.2 12/03/2015   HGB 11.7* 12/03/2015   HCT 35.9* 12/03/2015   PLT 186.0 12/03/2015   GLUCOSE 165* 12/03/2015   CHOL 132 12/03/2015   TRIG 99.0 12/03/2015   HDL 49.70 12/03/2015   LDLCALC 62 12/03/2015   ALT 17 12/03/2015   AST 15 12/03/2015   NA 138 12/03/2015   K 4.2 12/03/2015   CL 103 12/03/2015   CREATININE 0.97 12/03/2015   BUN 19 12/03/2015   CO2 29 12/03/2015   TSH 1.10 07/10/2015   PSA 1.08 07/10/2015   HGBA1C 7.8* 12/03/2015   MICROALBUR 17.5* 12/03/2015    Ct Head Wo Contrast  02/09/2014  CLINICAL DATA:  Fall.  Abnormal gait. EXAM: CT HEAD WITHOUT CONTRAST TECHNIQUE: Contiguous axial images were obtained from the base of the skull through the vertex without intravenous contrast. COMPARISON:  None. FINDINGS: No mass. No hydrocephalus. No hemorrhage. Diffuse cerebral atrophy. No hydrocephalus. Posterior fossa unremarkable. No acute bony abnormality. Mild mucosal thickening and ethmoidal sinuses. IMPRESSION: No acute intracranial abnormality. Electronically Signed   By: Frank [name]  Register   On: 02/09/2014 11:31    Assessment & Plan:   Frank [name] was seen today for hypertension.  Diagnoses and all orders for this  visit:  OAB (overactive bladder)- I will add Myrbetriq to oxybutynin to get additional symptom relief -     mirabegron ER (MYRBETRIQ) 25 MG TB24 tablet; Take 1 tablet (25 mg total) by mouth daily.  Mass of left hand- the mass does show up on plain x-ray but there is no evidence of calcification, I've asked him to see hand surgery to consider further evaluation of this suspicious lesion. -     DG Hand Complete Left; Future -     Ambulatory referral to Orthopedic Surgery  Essential hypertension- his blood pressures well controlled, will continue the ARB and doxazosin.   I have discontinued Mr. Moonen lisinopril. I am also having him start on mirabegron ER. Additionally, I am having him maintain his aspirin, cetirizine, FLUoxetine, metFORMIN, doxazosin, atorvastatin, oxybutynin, glipiZIDE, telmisartan, and JANUVIA.  Meds ordered this encounter  Medications  . DISCONTD: lisinopril (PRINIVIL,ZESTRIL) 5 MG tablet    Sig:   . mirabegron ER (MYRBETRIQ)  25 MG TB24 tablet    Sig: Take 1 tablet (25 mg total) by mouth daily.    Dispense:  90 tablet    Refill:  3     Follow-up: Return in about 4 months (around 04/11/2016).  Scarlette Calico, MD

## 2015-12-11 NOTE — Progress Notes (Signed)
Pre visit review using our clinic review tool, if applicable. No additional management support is needed unless otherwise documented below in the visit note. 

## 2015-12-11 NOTE — Patient Instructions (Signed)
Hypertension Hypertension, commonly called high blood pressure, is when the force of blood pumping through your arteries is too strong. Your arteries are the blood vessels that carry blood from your heart throughout your body. A blood pressure reading consists of a higher number over a lower number, such as 110/72. The higher number (systolic) is the pressure inside your arteries when your heart pumps. The lower number (diastolic) is the pressure inside your arteries when your heart relaxes. Ideally you want your blood pressure below 120/80. Hypertension forces your heart to work harder to pump blood. Your arteries may become narrow or stiff. Having untreated or uncontrolled hypertension can cause heart attack, stroke, kidney disease, and other problems. RISK FACTORS Some risk factors for high blood pressure are controllable. Others are not.  Risk factors you cannot control include:   Race. You may be at higher risk if you are African American.  Age. Risk increases with age.  Gender. Men are at higher risk than women before age 45 years. After age 65, women are at higher risk than men. Risk factors you can control include:  Not getting enough exercise or physical activity.  Being overweight.  Getting too much fat, sugar, calories, or salt in your diet.  Drinking too much alcohol. SIGNS AND SYMPTOMS Hypertension does not usually cause signs or symptoms. Extremely high blood pressure (hypertensive crisis) may cause headache, anxiety, shortness of breath, and nosebleed. DIAGNOSIS To check if you have hypertension, your health care provider will measure your blood pressure while you are seated, with your arm held at the level of your heart. It should be measured at least twice using the same arm. Certain conditions can cause a difference in blood pressure between your right and left arms. A blood pressure reading that is higher than normal on one occasion does not mean that you need treatment. If  it is not clear whether you have high blood pressure, you may be asked to return on a different day to have your blood pressure checked again. Or, you may be asked to monitor your blood pressure at home for 1 or more weeks. TREATMENT Treating high blood pressure includes making lifestyle changes and possibly taking medicine. Living a healthy lifestyle can help lower high blood pressure. You may need to change some of your habits. Lifestyle changes may include:  Following the DASH diet. This diet is high in fruits, vegetables, and whole grains. It is low in salt, red meat, and added sugars.  Keep your sodium intake below 2,300 mg per day.  Getting at least 30-45 minutes of aerobic exercise at least 4 times per week.  Losing weight if necessary.  Not smoking.  Limiting alcoholic beverages.  Learning ways to reduce stress. Your health care provider may prescribe medicine if lifestyle changes are not enough to get your blood pressure under control, and if one of the following is true:  You are 18-59 years of age and your systolic blood pressure is above 140.  You are 60 years of age or older, and your systolic blood pressure is above 150.  Your diastolic blood pressure is above 90.  You have diabetes, and your systolic blood pressure is over 140 or your diastolic blood pressure is over 90.  You have kidney disease and your blood pressure is above 140/90.  You have heart disease and your blood pressure is above 140/90. Your personal target blood pressure may vary depending on your medical conditions, your age, and other factors. HOME CARE INSTRUCTIONS    Have your blood pressure rechecked as directed by your health care provider.   Take medicines only as directed by your health care provider. Follow the directions carefully. Blood pressure medicines must be taken as prescribed. The medicine does not work as well when you skip doses. Skipping doses also puts you at risk for  problems.  Do not smoke.   Monitor your blood pressure at home as directed by your health care provider. SEEK MEDICAL CARE IF:   You think you are having a reaction to medicines taken.  You have recurrent headaches or feel dizzy.  You have swelling in your ankles.  You have trouble with your vision. SEEK IMMEDIATE MEDICAL CARE IF:  You develop a severe headache or confusion.  You have unusual weakness, numbness, or feel faint.  You have severe chest or abdominal pain.  You vomit repeatedly.  You have trouble breathing. MAKE SURE YOU:   Understand these instructions.  Will watch your condition.  Will get help right away if you are not doing well or get worse.   This information is not intended to replace advice given to you by your health care provider. Make sure you discuss any questions you have with your health care provider.   Document Released: 06/29/2005 Document Revised: 11/13/2014 Document Reviewed: 04/21/2013 Elsevier Interactive Patient Education 2016 [employer] Inc[employer]  

## 2015-12-26 DIAGNOSIS — M65842 Other synovitis and tenosynovitis, left hand: Secondary | ICD-10-CM | POA: Diagnosis not present

## 2015-12-26 DIAGNOSIS — M6588 Other synovitis and tenosynovitis, other site: Secondary | ICD-10-CM | POA: Diagnosis not present

## 2015-12-26 DIAGNOSIS — R2232 Localized swelling, mass and lump, left upper limb: Secondary | ICD-10-CM | POA: Diagnosis not present

## 2015-12-26 DIAGNOSIS — M19042 Primary osteoarthritis, left hand: Secondary | ICD-10-CM | POA: Insufficient documentation

## 2016-01-02 ENCOUNTER — Other Ambulatory Visit: Payer: Self-pay | Admitting: Internal Medicine

## 2016-01-07 DIAGNOSIS — Z961 Presence of intraocular lens: Secondary | ICD-10-CM | POA: Diagnosis not present

## 2016-01-07 DIAGNOSIS — H47012 Ischemic optic neuropathy, left eye: Secondary | ICD-10-CM | POA: Diagnosis not present

## 2016-01-07 DIAGNOSIS — G4733 Obstructive sleep apnea (adult) (pediatric): Secondary | ICD-10-CM | POA: Diagnosis not present

## 2016-01-08 ENCOUNTER — Ambulatory Visit (INDEPENDENT_AMBULATORY_CARE_PROVIDER_SITE_OTHER): Payer: Medicare Other

## 2016-01-08 DIAGNOSIS — E538 Deficiency of other specified B group vitamins: Secondary | ICD-10-CM

## 2016-01-08 MED ORDER — CYANOCOBALAMIN 1000 MCG/ML IJ SOLN
1000.0000 ug | Freq: Once | INTRAMUSCULAR | Status: AC
  Administered 2016-01-08: 1000 ug via INTRAMUSCULAR

## 2016-01-16 DIAGNOSIS — M19042 Primary osteoarthritis, left hand: Secondary | ICD-10-CM | POA: Diagnosis not present

## 2016-01-29 DIAGNOSIS — D1801 Hemangioma of skin and subcutaneous tissue: Secondary | ICD-10-CM | POA: Diagnosis not present

## 2016-01-29 DIAGNOSIS — L570 Actinic keratosis: Secondary | ICD-10-CM | POA: Diagnosis not present

## 2016-01-29 DIAGNOSIS — D692 Other nonthrombocytopenic purpura: Secondary | ICD-10-CM | POA: Diagnosis not present

## 2016-01-29 DIAGNOSIS — L814 Other melanin hyperpigmentation: Secondary | ICD-10-CM | POA: Diagnosis not present

## 2016-01-29 DIAGNOSIS — L821 Other seborrheic keratosis: Secondary | ICD-10-CM | POA: Diagnosis not present

## 2016-02-05 ENCOUNTER — Ambulatory Visit (INDEPENDENT_AMBULATORY_CARE_PROVIDER_SITE_OTHER): Payer: Medicare Other

## 2016-02-05 DIAGNOSIS — E538 Deficiency of other specified B group vitamins: Secondary | ICD-10-CM | POA: Diagnosis not present

## 2016-02-05 MED ORDER — CYANOCOBALAMIN 1000 MCG/ML IJ SOLN
1000.0000 ug | Freq: Once | INTRAMUSCULAR | Status: AC
  Administered 2016-02-05: 1000 ug via INTRAMUSCULAR

## 2016-02-20 ENCOUNTER — Other Ambulatory Visit: Payer: Self-pay | Admitting: Internal Medicine

## 2016-03-05 ENCOUNTER — Encounter: Payer: Self-pay | Admitting: Internal Medicine

## 2016-03-06 ENCOUNTER — Other Ambulatory Visit: Payer: Self-pay | Admitting: Internal Medicine

## 2016-03-06 DIAGNOSIS — I10 Essential (primary) hypertension: Secondary | ICD-10-CM

## 2016-03-06 DIAGNOSIS — E118 Type 2 diabetes mellitus with unspecified complications: Secondary | ICD-10-CM

## 2016-03-06 DIAGNOSIS — D519 Vitamin B12 deficiency anemia, unspecified: Secondary | ICD-10-CM

## 2016-03-11 ENCOUNTER — Ambulatory Visit (INDEPENDENT_AMBULATORY_CARE_PROVIDER_SITE_OTHER): Payer: Medicare Other

## 2016-03-11 ENCOUNTER — Encounter: Payer: Self-pay | Admitting: Internal Medicine

## 2016-03-11 ENCOUNTER — Other Ambulatory Visit (INDEPENDENT_AMBULATORY_CARE_PROVIDER_SITE_OTHER): Payer: Medicare Other

## 2016-03-11 DIAGNOSIS — I10 Essential (primary) hypertension: Secondary | ICD-10-CM | POA: Diagnosis not present

## 2016-03-11 DIAGNOSIS — Z23 Encounter for immunization: Secondary | ICD-10-CM

## 2016-03-11 DIAGNOSIS — D519 Vitamin B12 deficiency anemia, unspecified: Secondary | ICD-10-CM

## 2016-03-11 DIAGNOSIS — E118 Type 2 diabetes mellitus with unspecified complications: Secondary | ICD-10-CM | POA: Diagnosis not present

## 2016-03-11 DIAGNOSIS — E538 Deficiency of other specified B group vitamins: Secondary | ICD-10-CM

## 2016-03-11 LAB — CBC WITH DIFFERENTIAL/PLATELET
Basophils Absolute: 0 10*3/uL (ref 0.0–0.1)
Basophils Relative: 0.4 % (ref 0.0–3.0)
Eosinophils Absolute: 0.4 10*3/uL (ref 0.0–0.7)
Eosinophils Relative: 5.1 % — ABNORMAL HIGH (ref 0.0–5.0)
HCT: 35.4 % — ABNORMAL LOW (ref 39.0–52.0)
Hemoglobin: 11.6 g/dL — ABNORMAL LOW (ref 13.0–17.0)
Lymphocytes Relative: 23.3 % (ref 12.0–46.0)
Lymphs Abs: 1.7 10*3/uL (ref 0.7–4.0)
MCHC: 32.8 g/dL (ref 30.0–36.0)
MCV: 82.7 fl (ref 78.0–100.0)
Monocytes Absolute: 0.8 10*3/uL (ref 0.1–1.0)
Monocytes Relative: 10.4 % (ref 3.0–12.0)
Neutro Abs: 4.4 10*3/uL (ref 1.4–7.7)
Neutrophils Relative %: 60.8 % (ref 43.0–77.0)
Platelets: 190 10*3/uL (ref 150.0–400.0)
RBC: 4.27 Mil/uL (ref 4.22–5.81)
RDW: 14.7 % (ref 11.5–15.5)
WBC: 7.3 10*3/uL (ref 4.0–10.5)

## 2016-03-11 LAB — BASIC METABOLIC PANEL
BUN: 20 mg/dL (ref 6–23)
CO2: 31 mEq/L (ref 19–32)
Calcium: 8.8 mg/dL (ref 8.4–10.5)
Chloride: 99 mEq/L (ref 96–112)
Creatinine, Ser: 1.12 mg/dL (ref 0.40–1.50)
GFR: 67.49 mL/min (ref >60.00)
Glucose, Bld: 166 mg/dL — ABNORMAL HIGH (ref 70–99)
Potassium: 4.6 mEq/L (ref 3.5–5.1)
Sodium: 136 mEq/L (ref 135–145)

## 2016-03-11 LAB — HEMOGLOBIN A1C: Hgb A1c MFr Bld: 7.2 % — ABNORMAL HIGH (ref 4.6–6.5)

## 2016-03-11 MED ORDER — CYANOCOBALAMIN 1000 MCG/ML IJ SOLN
1000.0000 ug | Freq: Once | INTRAMUSCULAR | Status: AC
  Administered 2016-03-11: 1000 ug via INTRAMUSCULAR

## 2016-03-18 ENCOUNTER — Encounter: Payer: Self-pay | Admitting: Internal Medicine

## 2016-03-18 ENCOUNTER — Ambulatory Visit (INDEPENDENT_AMBULATORY_CARE_PROVIDER_SITE_OTHER)
Admission: RE | Admit: 2016-03-18 | Discharge: 2016-03-18 | Disposition: A | Discharge status: Home / Self Care | Payer: Medicare Other | Source: Ambulatory Visit | Attending: Internal Medicine | Admitting: Internal Medicine

## 2016-03-18 ENCOUNTER — Ambulatory Visit (INDEPENDENT_AMBULATORY_CARE_PROVIDER_SITE_OTHER): Payer: Medicare Other | Admitting: Internal Medicine

## 2016-03-18 VITALS — BP 144/66 | HR 82 | Temp 97.9°F | Resp 16 | Wt 216.0 lb

## 2016-03-18 DIAGNOSIS — J4521 Mild intermittent asthma with (acute) exacerbation: Secondary | ICD-10-CM

## 2016-03-18 DIAGNOSIS — G4733 Obstructive sleep apnea (adult) (pediatric): Secondary | ICD-10-CM | POA: Diagnosis not present

## 2016-03-18 DIAGNOSIS — J45901 Unspecified asthma with (acute) exacerbation: Secondary | ICD-10-CM | POA: Insufficient documentation

## 2016-03-18 DIAGNOSIS — J209 Acute bronchitis, unspecified: Secondary | ICD-10-CM | POA: Diagnosis not present

## 2016-03-18 DIAGNOSIS — R05 Cough: Secondary | ICD-10-CM | POA: Diagnosis not present

## 2016-03-18 MED ORDER — ALBUTEROL SULFATE HFA 108 (90 BASE) MCG/ACT IN AERS: 2.0000 | INHALATION_SPRAY | Freq: Four times a day (QID) | RESPIRATORY_TRACT | 0 refills | Status: DC | PRN

## 2016-03-18 MED ORDER — METHYLPREDNISOLONE ACETATE 80 MG/ML IJ SUSP
80.0000 mg | Freq: Once | INTRAMUSCULAR | Status: AC
  Administered 2016-03-18: 80 mg via INTRAMUSCULAR

## 2016-03-18 MED ORDER — DOXYCYCLINE HYCLATE 100 MG PO TABS: 100.0000 mg | ORAL_TABLET | Freq: Two times a day (BID) | ORAL | 0 refills | Status: DC

## 2016-03-18 NOTE — Assessment & Plan Note (Signed)
His symptoms are consistent with acute bronchitis, will get a cxr today to rule out PNA Doxycycline 100 mg BID x 10 days Albuterol inhaler as needed for asthma exacerbation Call or return if no improvement

## 2016-03-18 NOTE — Assessment & Plan Note (Signed)
Associated with acute bronchitis cxr today to rule out pna Doxycycline 100 mg BID x 10 days Diffuse wheeze on exam with sob - will give depo medrol 80 mg IM x 1 now Albuterol inhaler as needed for asthma exacerbation otc cold medications for symptom relief Call if no improvement or any worsening

## 2016-03-18 NOTE — Assessment & Plan Note (Signed)
He feels his cpap may need to be adjusted Will refer back to pulm - was following with Dr Gwenette Greet

## 2016-03-18 NOTE — Addendum Note (Signed)
Addended by: Terence Lux B on: 03/18/2016 04:01 PM   Modules accepted: Orders

## 2016-03-18 NOTE — Progress Notes (Signed)
Pre visit review using our clinic review tool, if applicable. No additional management support is needed unless otherwise documented below in the visit note. 

## 2016-03-18 NOTE — Patient Instructions (Signed)
Have a chest xray done today.  We will call you with the results.   Start taking the antibiotic - doxycycline as directed.  Use the inhaler every 4-6 hours as needed.   Your prescription(s) have been submitted to your pharmacy. Please take as directed and contact our office if you believe you are having problem(s) with the medication(s).   If you are not feeling better or feel worse after a couple of days please call.   A referral was ordered for pulmonary for your sleep apnea.

## 2016-03-18 NOTE — Progress Notes (Signed)
Subjective:    Patient ID: Lum Pfab, male    DOB: Nov 19, 1940, 75 y.o.   MRN: [id number]  HPI He is here for an acute visit.   Cold symptoms:  His symptoms started yesterday afternoon.  They have progressed quickly.  He has a history of asthma and has had pneumonia a couple of times in the past.  He has nasal congestion, cough that is productive with green phlegm, sob, wheeze, fatigue, and mild lightheadedness.  He did not sleep well last night because he was coughing so much.    He took tussin and it did not help much.   He also took some aspirin.    He has sleep apnea and uses his cpap nightly.  He has not seen pulmonary in a while and needs to follow up with them.   He does not feel his machine is strong enough.   Medications and allergies reviewed with patient and updated if appropriate.  Patient Active Problem List   Diagnosis Date Noted  . Onychomycosis of right great toe 07/10/2015  . OAB (overactive bladder) 11/26/2014  . Ataxia 05/17/2014  . Routine general medical examination at a health care facility 04/06/2014  . Unspecified vitamin D deficiency 07/23/2010  . OBSTRUCTIVE SLEEP APNEA 09/13/2008  . Hyperlipidemia with target LDL less than 100 07/27/2007  . BPH (benign prostatic hyperplasia) 06/21/2007  . 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 Type II diabetes mellitus with manifestations ([place]) 04/15/2006  . B12 deficiency anemia 04/15/2006  . Essential hypertension 04/15/2006  . ALLERGIC RHINITIS 04/15/2006    Current Outpatient Prescriptions on File Prior to Visit  Medication Sig Dispense Refill  . aspirin 81 MG tablet Take 81 mg by mouth daily.      [name] atorvastatin (LIPITOR) 20 MG tablet Take 0.5 tablets (10 mg total) by mouth daily. 90 tablet 3  . cetirizine (ZYRTEC) 10 MG tablet Take 10 mg by mouth daily.    [name] doxazosin (CARDURA) 4 MG tablet Take 0.5 tablets (2 mg total) by mouth at bedtime. 90 tablet 3  . FLUoxetine (PROZAC) 20 MG capsule Take 1  capsule by mouth  daily 90 capsule 3  . glipiZIDE (GLUCOTROL XL) 10 MG 24 hr tablet Take 1 tablet by mouth  daily 90 tablet 1  . JANUVIA 100 MG tablet TAKE ONE TABLET BY MOUTH ONCE DAILY 90 tablet 1  . metFORMIN (GLUCOPHAGE) 1000 MG tablet Take 1 tablet (1,000 mg total) by mouth 2 (two) times daily. 180 tablet 4  . mirabegron ER (MYRBETRIQ) 25 MG TB24 tablet Take 1 tablet (25 mg total) by mouth daily. 90 tablet 3  . oxybutynin (DITROPAN) 5 MG tablet Take 1 tablet by mouth two  times daily 180 tablet 1  . telmisartan (MICARDIS) 40 MG tablet Take 1 tablet by mouth  daily 90 tablet 1   No current facility-administered medications on file prior to visit.     Past Medical History:  Diagnosis Date  . Allergy    Rhinitis  . Anemia    NOS iron deficient and B12 deficient  . Arthritis   . Diabetes mellitus    Type 2  . GERD (gastroesophageal reflux disease)   . Hyperlipidemia   . Hypertension   . 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 OSA (obstructive sleep apnea)    cpap    Past Surgical History:  Procedure Laterality Date  . APPENDECTOMY    . arm fracture Left 2011   with  hardware  . COLONOSCOPY    . ESOPHAGOGASTRODUODENOSCOPY  2006   gastritis  . ROTATOR CUFF REPAIR    . SEPTOPLASTY  1959   Deviated Septum  . 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 Marital status: Married    Spouse name: N/A  . Number of children: N/A  . Years of education: N/A   Social History Main Topics  . Smoking status: Former Smoker    Packs/day: 1.00    Quit date: 07/14/1983  . Smokeless tobacco: Never Used  . Alcohol use 1.0 oz/week    2 drink(s) per week  . Drug use: Unknown  . Sexual activity: Not Asked   Other Topics Concern  . None   Social History Narrative  . None    Family History  Problem Relation Age of Onset  . Heart disease Mother   . Breast cancer Mother   . Stroke Father   . Allergies Father     Father and children  .  Coronary artery disease Brother   . Diabetes Neg Hx   . Colon cancer Neg Hx     Review of Systems  Constitutional: Positive for fatigue. Negative for appetite change and fever.  HENT: Positive for congestion. Negative for ear pain, sinus pressure and sore throat.   Respiratory: Positive for cough (productive, green phlegm), shortness of breath and wheezing (little). Negative for chest tightness.   Cardiovascular: Negative for chest pain and palpitations.  Gastrointestinal: Negative for abdominal pain, diarrhea and nausea.  Musculoskeletal: Negative for myalgias.  Neurological: Positive for light-headedness. Negative for headaches.       Objective:   Vitals:   03/18/16 1435  BP: (!) 144/66  Pulse: 82  Resp: 16  Temp: 97.9 F (36.6 C)   Filed Weights   03/18/16 1435  Weight: 216 lb (98 kg)   Body mass index is 30.99 kg/m.   Physical Exam GENERAL APPEARANCE: Appears stated age, well appearing, NAD EYES: conjunctiva clear, no icterus HEENT: bilateral tympanic membranes and ear canals normal, oropharynx with mild erythema, no thyromegaly, trachea midline, no cervical or supraclavicular lymphadenopathy LUNGS: Unlabored breathing, good air entry bilaterally, diffuse expiratory wheeze, no rales HEART: Normal S1,S2 without murmurs EXTREMITIES: trace edema       Assessment & Plan:   See Problem List for Assessment and Plan of chronic medical problems.

## 2016-03-26 ENCOUNTER — Telehealth: Payer: Self-pay | Admitting: Internal Medicine

## 2016-03-26 NOTE — Telephone Encounter (Signed)
Patient is still not feeling any better from last OV with Frank [name].  Is requesting call back in regard to talk about it.

## 2016-03-26 NOTE — Telephone Encounter (Signed)
Pt c/o of runny nose and cough. Pt has two more days of the Doxycycline. Still sounds very nasaly. Please advise what else pt can do to get some relief.

## 2016-03-26 NOTE — Telephone Encounter (Signed)
Has he tried mucinex, saline nasal sprays or flonase ?  Has he ever taken the cough pills tessalon perles - they help suppress the cough?

## 2016-03-27 MED ORDER — BENZONATATE 200 MG PO CAPS: 200.0000 mg | ORAL_CAPSULE | Freq: Three times a day (TID) | ORAL | 0 refills | Status: DC | PRN

## 2016-03-27 NOTE — Telephone Encounter (Signed)
sent 

## 2016-03-27 NOTE — Telephone Encounter (Signed)
Spoke with pt, he has not tried [employer] before but is okay with having them sent to [facility].

## 2016-04-08 ENCOUNTER — Ambulatory Visit (INDEPENDENT_AMBULATORY_CARE_PROVIDER_SITE_OTHER): Payer: Medicare Other

## 2016-04-08 DIAGNOSIS — E538 Deficiency of other specified B group vitamins: Secondary | ICD-10-CM

## 2016-04-08 MED ORDER — CYANOCOBALAMIN 1000 MCG/ML IJ SOLN
1000.0000 ug | Freq: Once | INTRAMUSCULAR | Status: AC
  Administered 2016-04-08: 1000 ug via INTRAMUSCULAR

## 2016-04-09 DIAGNOSIS — G4733 Obstructive sleep apnea (adult) (pediatric): Secondary | ICD-10-CM | POA: Diagnosis not present

## 2016-05-06 ENCOUNTER — Ambulatory Visit (INDEPENDENT_AMBULATORY_CARE_PROVIDER_SITE_OTHER): Payer: Medicare Other | Admitting: Geriatric Medicine

## 2016-05-06 ENCOUNTER — Encounter: Payer: Self-pay | Admitting: Pulmonary Disease

## 2016-05-06 ENCOUNTER — Ambulatory Visit (INDEPENDENT_AMBULATORY_CARE_PROVIDER_SITE_OTHER): Payer: Medicare Other | Admitting: Pulmonary Disease

## 2016-05-06 ENCOUNTER — Ambulatory Visit: Payer: Medicare Other

## 2016-05-06 DIAGNOSIS — G4733 Obstructive sleep apnea (adult) (pediatric): Secondary | ICD-10-CM | POA: Diagnosis not present

## 2016-05-06 DIAGNOSIS — E538 Deficiency of other specified B group vitamins: Secondary | ICD-10-CM

## 2016-05-06 MED ORDER — CYANOCOBALAMIN 1000 MCG/ML IJ SOLN
1000.0000 ug | Freq: Once | INTRAMUSCULAR | Status: AC
  Administered 2016-05-06: 1000 ug via INTRAMUSCULAR

## 2016-05-06 NOTE — Progress Notes (Signed)
Subjective:    Patient ID: Frank [name], male    DOB: 10/28/1940, 75 y.o.   MRN: [id number]  HPI  Chief Complaint  Patient presents with  . sleep consult    Per Dr. Quay Burow.pt currently wears cpap 6-8hr nightly. pt feels pressure isn't strong enough. [id number]    75 year old diabetic, hypertensive presents to establish care for obstructive sleep apnea. PSG in 2006 showed AHI of 34/hour with desaturation to 72%. He has been maintained on CPAP with nasal pillows since then with good results and improvement in his daytime somnolence. Snoring has been abolished for his wife. He has had the same machine for more than 8 years and his humidifier is not working anymore. He has been getting supplies on time through [facility]. Epworth sleepiness score is 8 and a report sleepiness while sitting and reading or while watching TV. He denies daytime naps and is able to stay active in the daytime  Bedtime is between 10 and 11 AM, sleep latency is minimal, he sleeps on his back with one pillow, reports 2-3 nocturnal awakenings without nocturia and is out of bed by 6:45 AM feeling refreshed without dryness of mouth or headaches. His weight is mostly unchanged over the past few years.   Past Medical History:  Diagnosis Date  . Allergy    Rhinitis  . Anemia    NOS iron deficient and B12 deficient  . Arthritis   . Diabetes mellitus    Type 2  . GERD (gastroesophageal reflux disease)   . Hyperlipidemia   . Hypertension   . 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 OSA (obstructive sleep apnea)    cpap     Past Surgical History:  Procedure Laterality Date  . APPENDECTOMY    . arm fracture Left 2011   with hardware  . COLONOSCOPY    . ESOPHAGOGASTRODUODENOSCOPY  2006   gastritis  . ROTATOR CUFF REPAIR    . SEPTOPLASTY  1959   Deviated Septum  . 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 Lisinopril Cough  . Penicillins     REACTION:  unspecified  . Sulfamethoxazole     REACTION: as child     Social History   Social History  . Marital status: Married    Spouse name: N/A  . Number of children: N/A  . Years of education: N/A   Occupational History  . Not on file.   Social History Main Topics  . Smoking status: Former Smoker    Packs/day: 1.00    Quit date: 07/14/1983  . Smokeless tobacco: Never Used  . Alcohol use 1.0 oz/week    2 drink(s) per week  . Drug use: Unknown  . Sexual activity: Not on file   Other Topics Concern  . Not on file   Social History Narrative  . No narrative on file     Family History  Problem Relation Age of Onset  . Heart disease Mother   . Breast cancer Mother   . Stroke Father   . Allergies Father     Father and children  . Coronary artery disease Brother   . Diabetes Neg Hx   . Colon cancer Neg Hx      Review of Systems  Constitutional: Negative for fever and unexpected weight change.  HENT: Positive for nosebleeds. Negative for congestion, dental problem, ear pain, postnasal drip, rhinorrhea, sinus pressure, sneezing, sore throat and trouble swallowing.  Eyes: Negative for redness and itching.  Respiratory: Negative for cough, chest tightness, shortness of breath and wheezing.   Cardiovascular: Negative for palpitations and leg swelling.  Gastrointestinal: Negative for nausea and vomiting.  Genitourinary: Negative for dysuria.  Musculoskeletal: Negative for joint swelling.  Skin: Negative for rash.  Neurological: Negative for headaches.  Hematological: Does not bruise/bleed easily.  Psychiatric/Behavioral: Negative for dysphoric mood. The patient is not nervous/anxious.        Objective:   Physical Exam  Gen. Pleasant, well-nourished,elderly, in no distress, normal affect ENT - no lesions, no post nasal drip Neck: No JVD, no thyromegaly, no carotid bruits Lungs: no use of accessory muscles, no dullness to percussion, clear without rales or rhonchi    Cardiovascular: Rhythm regular, heart sounds  normal, no murmurs or gallops, no peripheral edema Abdomen: soft and non-tender, no hepatosplenomegaly, BS normal. Musculoskeletal: No deformities, no cyanosis or clubbing Neuro:  alert, non focal       Assessment & Plan:

## 2016-05-06 NOTE — Assessment & Plan Note (Signed)
He appears to be compliant with his CPAP machine and has had good results with this. We will obtain settings from [facility] and asked them to obtain download on your machine  Based on that we will write prescription for new CPAP machine His machine is really old and we will replace it by sending prescription for new CPAP -download will be checked in a few weeks to ensure correct settings   -compliance with goal of at least 4-6 hrs every night is the expectation. Advised against medications with sedative side effects Cautioned against driving when sleepy - understanding that sleepiness will vary on a day to day basis

## 2016-05-06 NOTE — Patient Instructions (Signed)
We will obtain settings from [facility] and asked them to obtain download on your machine  Based on that we will write prescription for new CPAP machine

## 2016-05-07 ENCOUNTER — Encounter: Payer: Self-pay | Admitting: Pulmonary Disease

## 2016-05-07 DIAGNOSIS — G4733 Obstructive sleep apnea (adult) (pediatric): Secondary | ICD-10-CM | POA: Diagnosis not present

## 2016-05-27 ENCOUNTER — Institutional Professional Consult (permissible substitution): Payer: Medicare Other | Admitting: Pulmonary Disease

## 2016-06-05 ENCOUNTER — Encounter: Payer: Self-pay | Admitting: Internal Medicine

## 2016-06-05 ENCOUNTER — Encounter: Payer: Self-pay | Admitting: Pulmonary Disease

## 2016-06-07 ENCOUNTER — Other Ambulatory Visit: Payer: Self-pay | Admitting: Internal Medicine

## 2016-06-09 ENCOUNTER — Ambulatory Visit (INDEPENDENT_AMBULATORY_CARE_PROVIDER_SITE_OTHER): Payer: Medicare Other | Admitting: General Practice

## 2016-06-09 DIAGNOSIS — E538 Deficiency of other specified B group vitamins: Secondary | ICD-10-CM | POA: Diagnosis not present

## 2016-06-09 MED ORDER — CYANOCOBALAMIN 1000 MCG/ML IJ SOLN
1000.0000 ug | Freq: Once | INTRAMUSCULAR | Status: AC
  Administered 2016-06-09: 1000 ug via INTRAMUSCULAR

## 2016-06-16 ENCOUNTER — Encounter: Payer: Self-pay | Admitting: Internal Medicine

## 2016-06-16 ENCOUNTER — Other Ambulatory Visit (INDEPENDENT_AMBULATORY_CARE_PROVIDER_SITE_OTHER): Payer: Medicare Other

## 2016-06-16 ENCOUNTER — Ambulatory Visit (INDEPENDENT_AMBULATORY_CARE_PROVIDER_SITE_OTHER): Payer: Medicare Other | Admitting: Internal Medicine

## 2016-06-16 VITALS — BP 140/60 | HR 83 | Temp 98.1°F | Ht 69.5 in | Wt 217.0 lb

## 2016-06-16 DIAGNOSIS — D518 Other vitamin B12 deficiency anemias: Secondary | ICD-10-CM | POA: Diagnosis not present

## 2016-06-16 DIAGNOSIS — I10 Essential (primary) hypertension: Secondary | ICD-10-CM | POA: Diagnosis not present

## 2016-06-16 DIAGNOSIS — E118 Type 2 diabetes mellitus with unspecified complications: Secondary | ICD-10-CM | POA: Diagnosis not present

## 2016-06-16 DIAGNOSIS — D539 Nutritional anemia, unspecified: Secondary | ICD-10-CM

## 2016-06-16 LAB — BASIC METABOLIC PANEL
BUN: 22 mg/dL (ref 6–23)
CO2: 32 mEq/L (ref 19–32)
Calcium: 9.2 mg/dL (ref 8.4–10.5)
Chloride: 99 mEq/L (ref 96–112)
Creatinine, Ser: 1.17 mg/dL (ref 0.40–1.50)
GFR: 64.13 mL/min (ref >60.00)
Glucose, Bld: 209 mg/dL — ABNORMAL HIGH (ref 70–99)
Potassium: 4.6 mEq/L (ref 3.5–5.1)
Sodium: 137 mEq/L (ref 135–145)

## 2016-06-16 LAB — CBC WITH DIFFERENTIAL/PLATELET
Basophils Absolute: 0.1 10*3/uL (ref 0.0–0.1)
Basophils Relative: 0.9 % (ref 0.0–3.0)
Eosinophils Absolute: 0.3 10*3/uL (ref 0.0–0.7)
Eosinophils Relative: 5.4 % — ABNORMAL HIGH (ref 0.0–5.0)
HCT: 35.6 % — ABNORMAL LOW (ref 39.0–52.0)
Hemoglobin: 11.8 g/dL — ABNORMAL LOW (ref 13.0–17.0)
Lymphocytes Relative: 22.3 % (ref 12.0–46.0)
Lymphs Abs: 1.4 10*3/uL (ref 0.7–4.0)
MCHC: 33.1 g/dL (ref 30.0–36.0)
MCV: 83.1 fl (ref 78.0–100.0)
Monocytes Absolute: 0.8 10*3/uL (ref 0.1–1.0)
Monocytes Relative: 12.4 % — ABNORMAL HIGH (ref 3.0–12.0)
Neutro Abs: 3.8 10*3/uL (ref 1.4–7.7)
Neutrophils Relative %: 59 % (ref 43.0–77.0)
Platelets: 192 10*3/uL (ref 150.0–400.0)
RBC: 4.28 Mil/uL (ref 4.22–5.81)
RDW: 13.9 % (ref 11.5–15.5)
WBC: 6.4 10*3/uL (ref 4.0–10.5)

## 2016-06-16 LAB — FOLATE: Folate: 18.7 ng/mL (ref >5.9)

## 2016-06-16 LAB — HEMOGLOBIN A1C: Hgb A1c MFr Bld: 7.4 % — ABNORMAL HIGH (ref 4.6–6.5)

## 2016-06-16 LAB — IBC PANEL
Iron: 63 ug/dL (ref 42–165)
Saturation Ratios: 19.2 % — ABNORMAL LOW (ref 20.0–50.0)
Transferrin: 234 mg/dL (ref 212.0–360.0)

## 2016-06-16 LAB — FERRITIN: Ferritin: 52.8 ng/mL (ref 22.0–322.0)

## 2016-06-16 NOTE — Telephone Encounter (Signed)
Pt. Came to the office because he had not heard anything about him getting a new machine. According to previous messages we needed a download from his machine. Pt. Did inform me that he took his machine to [place]. I informed the pt. That I would reach out to [place]. Called [facility] and requested a download from his machine.Will forward the message to [place] to follow up on.

## 2016-06-16 NOTE — Patient Instructions (Signed)

## 2016-06-16 NOTE — Progress Notes (Signed)
Subjective:  Patient ID: Frank [name], male    DOB: 07-27-1940  Age: 75 y.o. MRN: [id number]  CC: Hypertension and Diabetes   HPI Frank [name] presents for follow-up on hypertension and diabetes. He complains of worsening fatigue over the last 1-2 years. He tells me his blood pressure and blood sugars have been relatively well controlled. He denies any recent episodes of headache/blurred vision/chest pain/shortness of breasts/DOE/palpitations/edema/palpitations.  Outpatient Medications Prior to Visit  Medication Sig Dispense Refill  . aspirin 81 MG tablet Take 81 mg by mouth daily.      [name] atorvastatin (LIPITOR) 20 MG tablet Take 0.5 tablets (10 mg total) by mouth daily. 90 tablet 3  . cetirizine (ZYRTEC) 10 MG tablet Take 10 mg by mouth daily.    [name] doxazosin (CARDURA) 4 MG tablet Take 0.5 tablets (2 mg total) by mouth at bedtime. 90 tablet 3  . FLUoxetine (PROZAC) 20 MG capsule Take 1 capsule by mouth  daily 90 capsule 3  . glipiZIDE (GLUCOTROL XL) 10 MG 24 hr tablet Take 1 tablet by mouth  daily 90 tablet 1  . JANUVIA 100 MG tablet TAKE ONE TABLET BY MOUTH ONCE DAILY 90 tablet 1  . metFORMIN (GLUCOPHAGE) 1000 MG tablet Take 1 tablet (1,000 mg total) by mouth 2 (two) times daily. 180 tablet 4  . oxybutynin (DITROPAN) 5 MG tablet Take 1 tablet by mouth two  times daily 180 tablet 1  . telmisartan (MICARDIS) 40 MG tablet Take 1 tablet by mouth  daily 90 tablet 1  . albuterol (PROVENTIL HFA;VENTOLIN HFA) 108 (90 Base) MCG/ACT inhaler Inhale 2 puffs into the lungs every 6 (six) hours as needed for wheezing or shortness of breath. 1 Inhaler 0  . benzonatate (TESSALON) 200 MG capsule Take 1 capsule (200 mg total) by mouth 3 (three) times daily as needed for cough. 30 capsule 0  . mirabegron ER (MYRBETRIQ) 25 MG TB24 tablet Take 1 tablet (25 mg total) by mouth daily. 90 tablet 3   No facility-administered medications prior to visit.     ROS Review of Systems    Constitutional: Positive for fatigue. Negative for activity change, appetite change, diaphoresis and unexpected weight change.  HENT: Negative.   Eyes: Negative.  Negative for visual disturbance.  Respiratory: Negative for cough, chest tightness, shortness of breath, wheezing and stridor.   Cardiovascular: Negative.  Negative for chest pain, palpitations and leg swelling.  Gastrointestinal: Negative for abdominal pain, blood in stool, constipation, diarrhea, nausea and vomiting.  Endocrine: Negative.  Negative for cold intolerance, heat intolerance, polydipsia, polyphagia and polyuria.  Genitourinary: Negative.  Negative for difficulty urinating, flank pain, frequency, hematuria and urgency.  Musculoskeletal: Negative.  Negative for arthralgias, back pain, myalgias and neck pain.  Skin: Negative.  Negative for color change and rash.  Allergic/Immunologic: Negative.   Neurological: Negative.  Negative for dizziness, weakness, light-headedness, numbness and headaches.  Hematological: Negative.  Negative for adenopathy. Does not bruise/bleed easily.  Psychiatric/Behavioral: Negative.     Objective:  BP 140/60 (BP Location: Left Arm, Patient Position: Sitting, Cuff Size: Normal)   Pulse 83   Temp 98.1 F (36.7 C) (Oral)   Ht 5' 9.5" (1.765 m)   Wt 217 lb (98.4 kg)   SpO2 91%   BMI 31.59 kg/m   BP Readings from Last 3 Encounters:  06/16/16 140/60  05/06/16 120/60  03/18/16 (!) 144/66    Wt Readings from Last 3 Encounters:  06/16/16 217 lb (98.4 kg)  05/06/16 219  lb 9.6 oz (99.6 kg)  03/18/16 216 lb (98 kg)    Physical Exam  Constitutional: He is oriented to person, place, and time. No distress.  HENT:  Mouth/Throat: Oropharynx is clear and moist. No oropharyngeal exudate.  Eyes: Conjunctivae are normal. Right eye exhibits no discharge. Left eye exhibits no discharge. No scleral icterus.  Neck: Normal range of motion. Neck supple. No JVD present. No tracheal deviation present.  No thyromegaly present.  Cardiovascular: Normal rate, regular rhythm, normal heart sounds and intact distal pulses.  Exam reveals no gallop and no friction rub.   No murmur heard. Pulmonary/Chest: Effort normal and breath sounds normal. No stridor. No respiratory distress. He has no wheezes. He has no rales. He exhibits no tenderness.  Abdominal: Soft. Bowel sounds are normal. He exhibits no distension and no mass. There is no tenderness. There is no rebound and no guarding.  Musculoskeletal: Normal range of motion. He exhibits no edema, tenderness or deformity.  Lymphadenopathy:    He has no cervical adenopathy.  Neurological: He is oriented to person, place, and time.  Skin: Skin is warm and dry. No rash noted. He is not diaphoretic. No erythema. No pallor.  Psychiatric: He has a normal mood and affect. His behavior is normal. Judgment and thought content normal.  Vitals reviewed.   Lab Results  Component Value Date   WBC 6.4 06/16/2016   HGB 11.8 (L) 06/16/2016   HCT 35.6 (L) 06/16/2016   PLT 192.0 06/16/2016   GLUCOSE 209 (H) 06/16/2016   CHOL 132 12/03/2015   TRIG 99.0 12/03/2015   HDL 49.70 12/03/2015   LDLCALC 62 12/03/2015   ALT 17 12/03/2015   AST 15 12/03/2015   NA 137 06/16/2016   K 4.6 06/16/2016   CL 99 06/16/2016   CREATININE 1.17 06/16/2016   BUN 22 06/16/2016   CO2 32 06/16/2016   TSH 0.89 06/16/2016   PSA 1.08 07/10/2015   HGBA1C 7.4 (H) 06/16/2016   MICROALBUR 17.5 (H) 12/03/2015    Dg Chest 2 View  Result Date: 03/18/2016 CLINICAL DATA:  Wheezing with cough and congestion for 2 days EXAM: CHEST  2 VIEW COMPARISON:  December 19, 2003 FINDINGS: There is mild scarring in the bases. There is no edema or consolidation. The heart size and pulmonary vascularity are normal. There is atherosclerotic calcification in the aorta. There is also calcification in the mitral annulus. There is evidence of old rib trauma on the right with remodeling. There is postoperative change  in the proximal left humerus. There is degenerative change in each shoulder. There is calcification in each carotid artery. IMPRESSION: Bibasilar scarring. No edema or consolidation. Aortic atherosclerosis. Remodeling of the anterior right fifth rib, stable. There is bilateral carotid artery calcification. Electronically Signed   By: Lowella Grip III M.D.   On: 03/18/2016 16:21    Assessment & Plan:   Frank [name] was seen today for hypertension and diabetes.  Diagnoses and all orders for this visit:  Essential hypertension- His blood pressure is adequately well-controlled, electrolytes and renal function are stable. -     Basic metabolic panel; Future -     Thyroid Panel With TSH; Future  Type 2 diabetes mellitus with complication, without long-term current use of insulin ([place])- his A1c has gone up slightly and is at 7.4%. Considering his age and other comorbidities this is adequately well controlled. Continue current regimen. -     Basic metabolic panel; Future -     Hemoglobin A1c; Future  Other vitamin B12 deficiency anemia- his H&H are stable, will continue B12 replacement therapy. 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 Will continue to monitor. -     IBC panel; Future -     Ferritin; Future -     Folate; Future   I have discontinued Frank [name] mirabegron ER, albuterol, and benzonatate. I am also having him maintain his aspirin, cetirizine, metFORMIN, doxazosin, atorvastatin, glipiZIDE, telmisartan, oxybutynin, FLUoxetine, and JANUVIA.  No orders of the defined types were placed in this encounter.    Follow-up: Return in about 6 months (around 12/15/2016).  Scarlette Calico, MD

## 2016-06-16 NOTE — Progress Notes (Unsigned)
Patient in the lobby - questioning on if readings from [place] were ever received - pt hasn't heard from [place] or them - pr

## 2016-06-16 NOTE — Progress Notes (Signed)
Pre visit review using our clinic review tool, if applicable. No additional management support is needed unless otherwise documented below in the visit note. 

## 2016-06-17 ENCOUNTER — Encounter: Payer: Self-pay | Admitting: Internal Medicine

## 2016-06-17 LAB — THYROID PANEL WITH TSH
Free Thyroxine Index: 2.1 (ref 1.4–3.8)
T3 Uptake: 34 % (ref 22–35)
T4, Total: 6.2 ug/dL (ref 4.5–12.0)
TSH: 0.89 mIU/L (ref 0.40–4.50)

## 2016-07-02 ENCOUNTER — Telehealth: Payer: Self-pay | Admitting: Pulmonary Disease

## 2016-07-02 ENCOUNTER — Encounter: Payer: Self-pay | Admitting: Internal Medicine

## 2016-07-02 ENCOUNTER — Other Ambulatory Visit: Payer: Self-pay | Admitting: Internal Medicine

## 2016-07-02 DIAGNOSIS — G4733 Obstructive sleep apnea (adult) (pediatric): Secondary | ICD-10-CM

## 2016-07-02 DIAGNOSIS — N3281 Overactive bladder: Secondary | ICD-10-CM

## 2016-07-02 NOTE — Telephone Encounter (Signed)
Per Dr Elsworth Soho -  CPAP download showed excellent usage.  No change in settings at this time.

## 2016-07-02 NOTE — Telephone Encounter (Signed)
Per the download results RA gave back to me : Per Dr Elsworth Soho, Please obtain CPAP pressure from [place] and we can at that point send Rx for new CPAP machine.  Thanks.

## 2016-07-02 NOTE — Telephone Encounter (Signed)
Called [facility]. They state that he got his CPAP machine back in 2013. [facility] does not know what his CPAP machine pressure is set as he had his current machine before he came to them.  RA - please advise. Thanks.

## 2016-07-03 NOTE — Telephone Encounter (Signed)
Spoke with pt. He is aware of CPAP download results. States that he would like to have a new machine. His current machine is about 77 years old.  RA - please advise. Thanks.

## 2016-07-03 NOTE — Telephone Encounter (Signed)
Okay to send order for new CPAP at same settings as his current machine

## 2016-07-07 NOTE — Telephone Encounter (Signed)
Order has been placed for the pt to have a new order for a new cpap machine.  Nothing further is needed.

## 2016-07-08 DIAGNOSIS — G4733 Obstructive sleep apnea (adult) (pediatric): Secondary | ICD-10-CM | POA: Diagnosis not present

## 2016-07-09 ENCOUNTER — Ambulatory Visit (INDEPENDENT_AMBULATORY_CARE_PROVIDER_SITE_OTHER): Payer: Medicare Other | Admitting: General Practice

## 2016-07-09 DIAGNOSIS — E538 Deficiency of other specified B group vitamins: Secondary | ICD-10-CM | POA: Diagnosis not present

## 2016-07-09 MED ORDER — CYANOCOBALAMIN 1000 MCG/ML IJ SOLN
1000.0000 ug | Freq: Once | INTRAMUSCULAR | Status: AC
  Administered 2016-07-09: 1000 ug via INTRAMUSCULAR

## 2016-07-10 ENCOUNTER — Encounter: Payer: Self-pay | Admitting: Pulmonary Disease

## 2016-07-17 ENCOUNTER — Other Ambulatory Visit: Payer: Self-pay | Admitting: Internal Medicine

## 2016-07-17 ENCOUNTER — Telehealth: Payer: Self-pay

## 2016-07-17 DIAGNOSIS — I10 Essential (primary) hypertension: Secondary | ICD-10-CM

## 2016-07-17 DIAGNOSIS — N401 Enlarged prostate with lower urinary tract symptoms: Secondary | ICD-10-CM

## 2016-07-17 DIAGNOSIS — E118 Type 2 diabetes mellitus with unspecified complications: Secondary | ICD-10-CM

## 2016-07-17 DIAGNOSIS — E785 Hyperlipidemia, unspecified: Secondary | ICD-10-CM

## 2016-07-17 MED ORDER — OXYBUTYNIN CHLORIDE 5 MG PO TABS: 5.0000 mg | ORAL_TABLET | Freq: Two times a day (BID) | ORAL | 1 refills | Status: DC

## 2016-07-17 MED ORDER — ATORVASTATIN CALCIUM 20 MG PO TABS: 10.0000 mg | ORAL_TABLET | Freq: Every day | ORAL | 3 refills | Status: DC

## 2016-07-17 MED ORDER — METFORMIN HCL 1000 MG PO TABS: 1000.0000 mg | ORAL_TABLET | Freq: Two times a day (BID) | ORAL | 1 refills | Status: DC

## 2016-07-17 MED ORDER — DOXAZOSIN MESYLATE 4 MG PO TABS: 2.0000 mg | ORAL_TABLET | Freq: Every day | ORAL | 3 refills | Status: DC

## 2016-07-17 NOTE — Telephone Encounter (Signed)
done

## 2016-07-17 NOTE — Telephone Encounter (Signed)
[facility] sent rf rq for  Doxazosin Metformin atorvastatin

## 2016-07-27 DIAGNOSIS — H2511 Age-related nuclear cataract, right eye: Secondary | ICD-10-CM | POA: Diagnosis not present

## 2016-07-27 DIAGNOSIS — Z0100 Encounter for examination of eyes and vision without abnormal findings: Secondary | ICD-10-CM | POA: Diagnosis not present

## 2016-08-12 DIAGNOSIS — H25811 Combined forms of age-related cataract, right eye: Secondary | ICD-10-CM | POA: Diagnosis not present

## 2016-08-12 DIAGNOSIS — H2511 Age-related nuclear cataract, right eye: Secondary | ICD-10-CM | POA: Diagnosis not present

## 2016-08-28 ENCOUNTER — Encounter: Payer: Self-pay | Admitting: Internal Medicine

## 2016-08-28 NOTE — Progress Notes (Signed)
Pre visit review using our clinic review tool, if applicable. No additional management support is needed unless otherwise documented below in the visit note. 

## 2016-08-28 NOTE — Progress Notes (Addendum)
Subjective:   Frank [name] is a 76 y.o. male who presents for Medicare Annual/Subsequent preventive examination.  Review of Systems:  No ROS.  Medicare Wellness Visit.    Sleep patterns: no sleep issues, feels rested on waking, gets up 1-2 times nightly to void and sleeps 7 hours nightly.   Home Safety/Smoke Alarms:  Feels safe in home. Smoke alarms in place.   Living environment; residence and Firearm Safety: 1-story house/ trailer, firearms stored safely. Seat Belt Safety/Bike Helmet: Wears seat belt.   Counseling:   Eye Exam- Patient has an appointment in 3 weeks Dental- going to make appnt soon with [facility]   Male:   CCS- Last 08/30/13, Report states age 35 with 2 negative results appropriate to stop screening   PSA- Lab Results  Component Value Date   PSA 1.08 07/10/2015   PSA 1.17 04/06/2014   PSA 0.93 10/05/2011         Objective:    Vitals: There were no vitals taken for this visit.  There is no height or weight on file to calculate BMI.  Tobacco History  Smoking Status  . Former Smoker  . Packs/day: 1.00  . Quit date: 07/14/1983  Smokeless Tobacco  . Never Used     Counseling given: Not Answered   Past Medical History:  Diagnosis Date  . Allergy    Rhinitis  . Anemia    NOS iron deficient and B12 deficient  . Arthritis   . Diabetes mellitus    Type 2  . GERD (gastroesophageal reflux disease)   . Hyperlipidemia   . Hypertension   . 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 OSA (obstructive sleep apnea)    cpap   Past Surgical History:  Procedure Laterality Date  . APPENDECTOMY    . arm fracture Left 2011   with hardware  . COLONOSCOPY    . ESOPHAGOGASTRODUODENOSCOPY  2006   gastritis  . ROTATOR CUFF REPAIR    . SEPTOPLASTY  1959   Deviated Septum  . 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 Heart disease Mother   . Breast cancer Mother   . Stroke  Father   . Allergies Father     Father and children  . Coronary artery disease Brother   . Diabetes Neg Hx   . Colon cancer Neg Hx    History  Sexual Activity  . Sexual activity: Not on file    Outpatient Encounter Prescriptions as of 08/31/2016  Medication Sig  . aspirin 81 MG tablet Take 81 mg by mouth daily.    [name] atorvastatin (LIPITOR) 20 MG tablet Take 0.5 tablets (10 mg total) by mouth daily.  . cetirizine (ZYRTEC) 10 MG tablet Take 10 mg by mouth daily.  [name] doxazosin (CARDURA) 4 MG tablet Take 0.5 tablets (2 mg total) by mouth at bedtime.  [name] FLUoxetine (PROZAC) 20 MG capsule Take 1 capsule by mouth  daily  . glipiZIDE (GLUCOTROL XL) 10 MG 24 hr tablet Take 1 tablet by mouth  daily  . JANUVIA 100 MG tablet TAKE ONE TABLET BY MOUTH ONCE DAILY  . metFORMIN (GLUCOPHAGE) 1000 MG tablet Take 1 tablet (1,000 mg total) by mouth 2 (two) times daily.  [name] oxybutynin (DITROPAN) 5 MG tablet Take 1 tablet (5 mg total) by mouth 2 (two) times daily.  [name] telmisartan (MICARDIS) 40 MG tablet Take 1 tablet by mouth  daily   No facility-administered encounter medications on file as of 08/31/2016.     Activities of Daily Living No flowsheet data found.  Patient Care Team: Janith Lima, MD as PCP - General (Internal Medicine)   Assessment:    Physical assessment deferred to PCP.  Exercise Activities and Dietary recommendations   Diet (meal preparation, eat out, water intake, caffeinated beverages, dairy products, fruits and vegetables): well balanced, diabetic, low fat/ cholesterol, low salt   3-4 cups of water  Encouraged patient to increase water, vegetables and fruit intake.  Goals    None     Fall Risk Fall Risk  07/10/2015 07/10/2015 04/06/2014 11/01/2013  Falls in the past year? - Yes No Yes  Number falls in past yr: 1 2 or more - 1  Injury with Fall? No No - Yes  Risk for fall due to : History of fall(s) - - -  Follow up Education provided - - -   Depression Screen PHQ 2/9  Scores 07/10/2015 07/10/2015 04/06/2014 11/01/2013  PHQ - 2 Score 0 0 0 0    Cognitive Function       Ad8 score reviewed for issues:  Issues making decisions: no  Less interest in hobbies / activities: no  Repeats questions, stories (family complaining): no  Trouble using ordinary gadgets (microwave, computer, phone): no  Forgets the month or year: no  Mismanaging finances: no  Remembering appts: no  Daily problems with thinking and/or memory: no Ad8 score is= 0     Immunization History  Administered Date(s) Administered  . Influenza Split 05/06/2011, 03/23/2012  . Influenza Whole 04/17/2009, 04/16/2010  . Influenza, High Dose Seasonal PF 03/11/2016  . Influenza,inj,Quad PF,36+ Mos 04/04/2013, 04/06/2014, 03/06/2015  . Pneumococcal Conjugate-13 11/01/2013  . Pneumococcal Polysaccharide-23 07/13/2001, 09/17/2010  . Td 01/16/2010  . Zoster 05/06/2011   Screening Tests Health Maintenance  Topic Date Due  . OPHTHALMOLOGY EXAM  04/04/2016  . HEMOGLOBIN A1C  12/15/2016  . TETANUS/TDAP  01/17/2020  . INFLUENZA VACCINE  Completed  . ZOSTAVAX  Completed  . PNA vac Low Risk Adult  Completed      Plan:     Patient has intermittent issues with urine leakage. Discussed kegel exercises, increase water intake, going to the bathroom every 2 hours to train bladder.   During the course of the visit the patient was educated and counseled about the following appropriate screening and preventive services:   Vaccines to include Pneumoccal, Influenza, Hepatitis B, Td, Zostavax, HCV  Cardiovascular Disease  Colorectal cancer screening  Diabetes screening  Prostate Cancer Screening  Glaucoma screening  Nutrition counseling   Patient Instructions (the written plan) was given to the patient.    Michiel Cowboy, RN  08/28/2016  Medical screening examination/treatment/procedure(s) were performed by non-physician practitioner and as supervising physician I was immediately  available for consultation/collaboration. I agree with above. Scarlette Calico, MD

## 2016-08-31 ENCOUNTER — Ambulatory Visit (INDEPENDENT_AMBULATORY_CARE_PROVIDER_SITE_OTHER): Payer: Medicare Other | Admitting: *Deleted

## 2016-08-31 VITALS — BP 154/82 | HR 68 | Resp 18 | Ht 70.0 in | Wt 217.0 lb

## 2016-08-31 DIAGNOSIS — E538 Deficiency of other specified B group vitamins: Secondary | ICD-10-CM

## 2016-08-31 DIAGNOSIS — Z0000 Encounter for general adult medical examination without abnormal findings: Secondary | ICD-10-CM

## 2016-08-31 DIAGNOSIS — D1801 Hemangioma of skin and subcutaneous tissue: Secondary | ICD-10-CM | POA: Diagnosis not present

## 2016-08-31 DIAGNOSIS — D225 Melanocytic nevi of trunk: Secondary | ICD-10-CM | POA: Diagnosis not present

## 2016-08-31 DIAGNOSIS — L814 Other melanin hyperpigmentation: Secondary | ICD-10-CM | POA: Diagnosis not present

## 2016-08-31 DIAGNOSIS — D692 Other nonthrombocytopenic purpura: Secondary | ICD-10-CM | POA: Diagnosis not present

## 2016-08-31 DIAGNOSIS — L570 Actinic keratosis: Secondary | ICD-10-CM | POA: Diagnosis not present

## 2016-08-31 DIAGNOSIS — L821 Other seborrheic keratosis: Secondary | ICD-10-CM | POA: Diagnosis not present

## 2016-08-31 MED ORDER — CYANOCOBALAMIN 1000 MCG/ML IJ SOLN: 1000.0000 ug | Freq: Once | INTRAMUSCULAR | Status: DC

## 2016-08-31 MED ORDER — CYANOCOBALAMIN 1000 MCG/ML IJ SOLN
1000.0000 ug | Freq: Once | INTRAMUSCULAR | Status: AC
  Administered 2016-08-31: 1000 ug via INTRAMUSCULAR

## 2016-08-31 NOTE — Patient Instructions (Addendum)
Frank [name] , Thank you for taking time to come for your Medicare Wellness Visit. I appreciate your ongoing commitment to your health goals. Please review the following plan we discussed and let me know if I can assist you in the future.   These are the goals we discussed: Goals    . Exercise 3x per week (60 min per time)          Walk in my neighborhood and/or go to the [facility] 3 times per week.        This is a list of the screening recommended for you and due dates:  Health Maintenance  Topic Date Due  . Eye exam for diabetics  04/04/2016  . Hemoglobin A1C  12/15/2016  . Tetanus Vaccine  01/17/2020  . Flu Shot  Completed  . Pneumonia vaccines  Completed    Fall Prevention in the Home Introduction Falls can cause injuries. They can happen to people of all ages. There are many things you can do to make your home safe and to help prevent falls. What can I do on the outside of my home?  Regularly fix the edges of walkways and driveways and fix any cracks.  Remove anything that might make you trip as you walk through a door, such as a raised step or threshold.  Trim any bushes or trees on the path to your home.  Use bright outdoor lighting.  Clear any walking paths of anything that might make someone trip, such as rocks or tools.  Regularly check to see if handrails are loose or broken. Make sure that both sides of any steps have handrails.  Any raised decks and porches should have guardrails on the edges.  Have any leaves, snow, or ice cleared regularly.  Use sand or salt on walking paths during winter.  Clean up any spills in your garage right away. This includes oil or grease spills. What can I do in the bathroom?  Use night lights.  Install grab bars by the toilet and in the tub and shower. Do not use towel bars as grab bars.  Use non-skid mats or decals in the tub or shower.  If you need to sit down in the shower, use a plastic, non-slip stool.  Keep the  floor dry. Clean up any water that spills on the floor as soon as it happens.  Remove soap buildup in the tub or shower regularly.  Attach bath mats securely with double-sided non-slip rug tape.  Do not have throw rugs and other things on the floor that can make you trip. What can I do in the bedroom?  Use night lights.  Make sure that you have a light by your bed that is easy to reach.  Do not use any sheets or blankets that are too big for your bed. They should not hang down onto the floor.  Have a firm chair that has side arms. You can use this for support while you get dressed.  Do not have throw rugs and other things on the floor that can make you trip. What can I do in the kitchen?  Clean up any spills right away.  Avoid walking on wet floors.  Keep items that you use a lot in easy-to-reach places.  If you need to reach something above you, use a strong step stool that has a grab bar.  Keep electrical cords out of the way.  Do not use floor polish or wax that makes floors slippery.  If you must use wax, use non-skid floor wax.  Do not have throw rugs and other things on the floor that can make you trip. What can I do with my stairs?  Do not leave any items on the stairs.  Make sure that there are handrails on both sides of the stairs and use them. Fix handrails that are broken or loose. Make sure that handrails are as long as the stairways.  Check any carpeting to make sure that it is firmly attached to the stairs. Fix any carpet that is loose or worn.  Avoid having throw rugs at the top or bottom of the stairs. If you do have throw rugs, attach them to the floor with carpet tape.  Make sure that you have a light switch at the top of the stairs and the bottom of the stairs. If you do not have them, ask someone to add them for you. What else can I do to help prevent falls?  Wear shoes that:  Do not have high heels.  Have rubber bottoms.  Are comfortable and fit  you well.  Are closed at the toe. Do not wear sandals.  If you use a stepladder:  Make sure that it is fully opened. Do not climb a closed stepladder.  Make sure that both sides of the stepladder are locked into place.  Ask someone to hold it for you, if possible.  Clearly mark and make sure that you can see:  Any grab bars or handrails.  First and last steps.  Where the edge of each step is.  Use tools that help you move around (mobility aids) if they are needed. These include:  Canes.  Walkers.  Scooters.  Crutches.  Turn on the lights when you go into a dark area. Replace any light bulbs as soon as they burn out.  Set up your furniture so you have a clear path. Avoid moving your furniture around.  If any of your floors are uneven, fix them.  If there are any pets around you, be aware of where they are.  Review your medicines with your doctor. Some medicines can make you feel dizzy. This can increase your chance of falling. Ask your doctor what other things that you can do to help prevent falls. This information is not intended to replace advice given to you by your health care provider. Make sure you discuss any questions you have with your health care provider. Document Released: 04/25/2009 Document Revised: 12/05/2015 Document Reviewed: 08/03/2014  2017 [employer]vier

## 2016-09-01 ENCOUNTER — Other Ambulatory Visit: Payer: Self-pay | Admitting: Internal Medicine

## 2016-09-30 ENCOUNTER — Ambulatory Visit (INDEPENDENT_AMBULATORY_CARE_PROVIDER_SITE_OTHER): Payer: Medicare Other

## 2016-09-30 ENCOUNTER — Telehealth: Payer: Self-pay

## 2016-09-30 ENCOUNTER — Encounter: Payer: Self-pay | Admitting: Internal Medicine

## 2016-09-30 ENCOUNTER — Other Ambulatory Visit (INDEPENDENT_AMBULATORY_CARE_PROVIDER_SITE_OTHER): Payer: Medicare Other

## 2016-09-30 DIAGNOSIS — E538 Deficiency of other specified B group vitamins: Secondary | ICD-10-CM

## 2016-09-30 LAB — VITAMIN B12: Vitamin B-12: 469 pg/mL (ref 211–911)

## 2016-09-30 MED ORDER — CYANOCOBALAMIN 1000 MCG/ML IJ SOLN
1000.0000 ug | Freq: Once | INTRAMUSCULAR | Status: AC
  Administered 2016-09-30: 1000 ug via INTRAMUSCULAR

## 2016-09-30 NOTE — Telephone Encounter (Signed)
Routing to dr Ronnald Ramp, I have entered b12 lab order for this patient and greg has agreed to sign order---patient in for nurse visit, b12 injection today---a valid lab has not been done since 2012 for a verifiable b12 lab value---patient is getting lab first today and then injection, also routing to greg, fyi.Marland KitchenMarland KitchenMarland Kitchen

## 2016-09-30 NOTE — Telephone Encounter (Signed)
B12 normal. Will defer to Dr. Ronnald Ramp for continuation of treatment.

## 2016-10-17 ENCOUNTER — Other Ambulatory Visit: Payer: Self-pay | Admitting: Internal Medicine

## 2016-10-17 DIAGNOSIS — E118 Type 2 diabetes mellitus with unspecified complications: Secondary | ICD-10-CM

## 2016-10-23 DIAGNOSIS — G4733 Obstructive sleep apnea (adult) (pediatric): Secondary | ICD-10-CM | POA: Diagnosis not present

## 2016-10-29 LAB — HM DIABETES EYE EXAM

## 2016-11-02 ENCOUNTER — Telehealth: Payer: Self-pay | Admitting: Internal Medicine

## 2016-11-02 ENCOUNTER — Other Ambulatory Visit: Payer: Self-pay | Admitting: Internal Medicine

## 2016-11-02 DIAGNOSIS — D518 Other vitamin B12 deficiency anemias: Secondary | ICD-10-CM

## 2016-11-02 DIAGNOSIS — I10 Essential (primary) hypertension: Secondary | ICD-10-CM

## 2016-11-02 DIAGNOSIS — E118 Type 2 diabetes mellitus with unspecified complications: Secondary | ICD-10-CM

## 2016-11-02 DIAGNOSIS — E559 Vitamin D deficiency, unspecified: Secondary | ICD-10-CM

## 2016-11-02 NOTE — Telephone Encounter (Signed)
Pt was told to have labs done to check his B12 levels and possibly some other labs done also. Can this order be put in for him? He would like a call when they are ready. Please advise.

## 2016-11-02 NOTE — Telephone Encounter (Signed)
Called pt and B12 redraw is due in May. Pt is requesting his other labs as well (a1c, etc).

## 2016-11-02 NOTE — Telephone Encounter (Signed)
Labs ordered.

## 2016-11-03 NOTE — Telephone Encounter (Signed)
Pt informed that labs have been ordered.

## 2016-11-04 ENCOUNTER — Ambulatory Visit: Payer: Medicare Other

## 2016-11-05 ENCOUNTER — Telehealth: Payer: Self-pay | Admitting: Emergency Medicine

## 2016-11-05 ENCOUNTER — Other Ambulatory Visit: Payer: Self-pay | Admitting: Internal Medicine

## 2016-11-05 ENCOUNTER — Other Ambulatory Visit (INDEPENDENT_AMBULATORY_CARE_PROVIDER_SITE_OTHER): Payer: Medicare Other

## 2016-11-05 DIAGNOSIS — E559 Vitamin D deficiency, unspecified: Secondary | ICD-10-CM

## 2016-11-05 DIAGNOSIS — E118 Type 2 diabetes mellitus with unspecified complications: Secondary | ICD-10-CM

## 2016-11-05 DIAGNOSIS — D518 Other vitamin B12 deficiency anemias: Secondary | ICD-10-CM | POA: Diagnosis not present

## 2016-11-05 DIAGNOSIS — I10 Essential (primary) hypertension: Secondary | ICD-10-CM | POA: Diagnosis not present

## 2016-11-05 LAB — BASIC METABOLIC PANEL
BUN: 26 mg/dL — ABNORMAL HIGH (ref 6–23)
CO2: 30 mEq/L (ref 19–32)
Calcium: 9.7 mg/dL (ref 8.4–10.5)
Chloride: 100 mEq/L (ref 96–112)
Creatinine, Ser: 1.14 mg/dL (ref 0.40–1.50)
GFR: 66.01 mL/min (ref >60.00)
Glucose, Bld: 172 mg/dL — ABNORMAL HIGH (ref 70–99)
Potassium: 4.6 mEq/L (ref 3.5–5.1)
Sodium: 138 mEq/L (ref 135–145)

## 2016-11-05 LAB — HEMOGLOBIN A1C: Hgb A1c MFr Bld: 7.3 % — ABNORMAL HIGH (ref 4.6–6.5)

## 2016-11-05 LAB — VITAMIN D 25 HYDROXY (VIT D DEFICIENCY, FRACTURES): VITD: 34.09 ng/mL (ref 30.00–100.00)

## 2016-11-05 LAB — VITAMIN B12: Vitamin B-12: 434 pg/mL (ref 211–911)

## 2016-11-05 NOTE — Telephone Encounter (Signed)
Lab called this morning stating that pt came in for labs, BMP and Hgb A1c need to be re-entered. And pt wants to get a vitamin d and b12. I see that pt had b12 checked in march. Please advise.

## 2016-11-05 NOTE — Telephone Encounter (Signed)
This has been taken care of. Closing note.

## 2016-11-06 ENCOUNTER — Encounter: Payer: Self-pay | Admitting: Internal Medicine

## 2016-11-22 DIAGNOSIS — G4733 Obstructive sleep apnea (adult) (pediatric): Secondary | ICD-10-CM | POA: Diagnosis not present

## 2016-12-04 ENCOUNTER — Encounter: Payer: Self-pay | Admitting: Internal Medicine

## 2016-12-09 ENCOUNTER — Ambulatory Visit (INDEPENDENT_AMBULATORY_CARE_PROVIDER_SITE_OTHER): Payer: Medicare Other | Admitting: Nurse Practitioner

## 2016-12-09 ENCOUNTER — Encounter: Payer: Self-pay | Admitting: Nurse Practitioner

## 2016-12-09 VITALS — BP 160/74 | HR 67 | Temp 97.7°F | Ht 69.5 in | Wt 214.0 lb

## 2016-12-09 DIAGNOSIS — R590 Localized enlarged lymph nodes: Secondary | ICD-10-CM

## 2016-12-09 DIAGNOSIS — E118 Type 2 diabetes mellitus with unspecified complications: Secondary | ICD-10-CM | POA: Diagnosis not present

## 2016-12-09 MED ORDER — SITAGLIPTIN PHOSPHATE 100 MG PO TABS: 100.0000 mg | ORAL_TABLET | Freq: Every day | ORAL | 0 refills | Status: DC

## 2016-12-09 NOTE — Progress Notes (Signed)
Subjective:  Patient ID: Frank [name], male    DOB: 03-May-1941  Age: 76 y.o. MRN: [id number]  CC: Cyst (knot under left chin notice on Sunday. req refil for Januvia?)   HPI Needs Januvia refilled.  Nodule under chin: He noted a lump under left sde of chin 2days ago.  Outpatient Medications Prior to Visit  Medication Sig Dispense Refill  . aspirin 81 MG tablet Take 81 mg by mouth daily.      [name] atorvastatin (LIPITOR) 20 MG tablet Take 0.5 tablets (10 mg total) by mouth daily. 90 tablet 3  . cetirizine (ZYRTEC) 10 MG tablet Take 10 mg by mouth daily.    [name] doxazosin (CARDURA) 4 MG tablet Take 0.5 tablets (2 mg total) by mouth at bedtime. 90 tablet 3  . FLUoxetine (PROZAC) 20 MG capsule Take 1 capsule by mouth  daily 90 capsule 3  . glipiZIDE (GLUCOTROL XL) 10 MG 24 hr tablet TAKE 1 TABLET BY MOUTH  DAILY 90 tablet 1  . metFORMIN (GLUCOPHAGE) 1000 MG tablet TAKE 1 TABLET BY MOUTH TWO  TIMES DAILY 180 tablet 1  . oxybutynin (DITROPAN) 5 MG tablet Take 1 tablet (5 mg total) by mouth 2 (two) times daily. 180 tablet 1  . telmisartan (MICARDIS) 40 MG tablet TAKE 1 TABLET BY MOUTH  DAILY 90 tablet 1  . JANUVIA 100 MG tablet TAKE ONE TABLET BY MOUTH ONCE DAILY 90 tablet 1   No facility-administered medications prior to visit.     ROS Review of Systems  Constitutional: Negative for chills, diaphoresis, fever, malaise/fatigue and weight loss.  HENT: Negative.  Negative for congestion, sinus pain and sore throat.   Respiratory: Negative.  Negative for cough and stridor.   Gastrointestinal: Negative for heartburn.  Musculoskeletal: Negative for joint pain and neck pain.  Skin: Negative.   Neurological: Negative for dizziness, weakness and headaches.  Endo/Heme/Allergies: Does not bruise/bleed easily.    Objective:  BP (!) 160/74   Pulse 67   Temp 97.7 F (36.5 C)   Ht 5' 9.5" (1.765 m)   Wt 214 lb (97.1 kg)   SpO2 98%   BMI 31.15 kg/m   BP Readings from Last 3  Encounters:  12/09/16 (!) 160/74  08/31/16 (!) 154/82  06/16/16 140/60    Wt Readings from Last 3 Encounters:  12/09/16 214 lb (97.1 kg)  08/31/16 217 lb (98.4 kg)  06/16/16 217 lb (98.4 kg)    Physical Exam  Constitutional: He is oriented to person, place, and time. No distress.  HENT:  Right Ear: Tympanic membrane, external ear and ear canal normal.  Left Ear: Tympanic membrane, external ear and ear canal normal.  Nose: Nose normal.  Mouth/Throat: Uvula is midline, oropharynx is clear and moist and mucous membranes are normal. No oral lesions. No trismus in the jaw.  Neck: Normal range of motion. Neck supple. No JVD present. No thyromegaly present.    Cardiovascular: Normal rate.   Pulmonary/Chest: Effort normal.  Musculoskeletal: He exhibits no tenderness.  Lymphadenopathy:    He has no cervical adenopathy.  Neurological: He is alert and oriented to person, place, and time.  Skin: Skin is warm and dry. No rash noted. No erythema.  Vitals reviewed.   Lab Results  Component Value Date   WBC 6.4 06/16/2016   HGB 11.8 (L) 06/16/2016   HCT 35.6 (L) 06/16/2016   PLT 192.0 06/16/2016   GLUCOSE 172 (H) 11/05/2016   CHOL 132 12/03/2015   TRIG 99.0 12/03/2015  HDL 49.70 12/03/2015   LDLCALC 62 12/03/2015   ALT 17 12/03/2015   AST 15 12/03/2015   NA 138 11/05/2016   K 4.6 11/05/2016   CL 100 11/05/2016   CREATININE 1.14 11/05/2016   BUN 26 (H) 11/05/2016   CO2 30 11/05/2016   TSH 0.89 06/16/2016   PSA 1.08 07/10/2015   HGBA1C 7.3 (H) 11/05/2016   MICROALBUR 17.5 (H) 12/03/2015    Dg Chest 2 View  Result Date: 03/18/2016 CLINICAL DATA:  Wheezing with cough and congestion for 2 days EXAM: CHEST  2 VIEW COMPARISON:  December 19, 2003 FINDINGS: There is mild scarring in the bases. There is no edema or consolidation. The heart size and pulmonary vascularity are normal. There is atherosclerotic calcification in the aorta. There is also calcification in the mitral annulus.  There is evidence of old rib trauma on the right with remodeling. There is postoperative change in the proximal left humerus. There is degenerative change in each shoulder. There is calcification in each carotid artery. IMPRESSION: Bibasilar scarring. No edema or consolidation. Aortic atherosclerosis. Remodeling of the anterior right fifth rib, stable. There is bilateral carotid artery calcification. Electronically Signed   By: Lowella Grip III M.D.   On: 03/18/2016 16:21    Assessment & Plan:   [name] was seen today for cyst.  Diagnoses and all orders for this visit:  Enlarged submental lymph node  Type 2 diabetes mellitus with complication, without long-term current use of insulin ([place]) -     sitaGLIPtin (JANUVIA) 100 MG tablet; Take 1 tablet (100 mg total) by mouth daily.   I have changed [name] to sitaGLIPtin. I am also having him maintain his aspirin, cetirizine, FLUoxetine, oxybutynin, atorvastatin, doxazosin, telmisartan, glipiZIDE, and metFORMIN.  Meds ordered this encounter  Medications  . sitaGLIPtin (JANUVIA) 100 MG tablet    Sig: Take 1 tablet (100 mg total) by mouth daily.    Dispense:  90 tablet    Refill:  0    Order Specific Question:   Supervising Provider    Answer:   Cassandria Anger [[id number]]    Follow-up: Return if symptoms worsen or fail to improve.  Wilfred Lacy, NP

## 2016-12-09 NOTE — Patient Instructions (Addendum)
We will need to monitor lymph node at this time. Return to office if develops increase in size, pain, fever, sore throat, difficulty chewing or swallowing.  Lymphangitis, Adult Lymphangitis is inflammation of one or more lymph vessels. This condition is usually caused by a bacterial infection. The lymphatic system is part of the body's defense system (immune system). It is a network of vessels, glands, and organs that carry fluid (lymph) and other substances around the body. Lymph vessels drain into glands called lymph nodes. These nodes filter bacteria and waste products from lymph. Lymphangitis usually develops when bacteria spreads to the lymphatic system from an infected wound or scrape on the skin of the arms or legs. It can also result from a skin infection (cellulitis) that spreads to the lymph nodes. Lymphangitis can spread quickly through your lymph system and into your blood (bacteremia). What are the causes? This condition is usually caused by bacteria that get into the lymph vessels. Most often, this is streptococcus bacteria. In some cases, staphylococcus bacteria may be the cause. Many other types of bacteria can also cause lymphangitis, but that is rare. What increases the risk? The following factors may make you more likely to develop this condition:  Being male. Men are more likely to get lymphangitis caused by cellulitis.  Having a decreased ability to fight infection or a weakened immune system.  Having diabetes.  Taking drugs that suppress the immune system.  Having chickenpox.  Being weak from another illness. What are the signs or symptoms? The most common symptom of lymphangitis is a wound or skin infection that develops red streaks in the skin. These are the infected lymph vessels. The red streaks will extend toward the lymph nodes that drain the vessels. Other symptoms may include:  Warmth and tenderness over the streaks.  Throbbing pain.  Swollen and tender  lymph nodes.  For arm infections, these will be under the arm.  For leg infections, these will be in the groin area.  Fever.  Chills.  Headache.  Appetite loss.  Muscle aches.  Fast pulse. How is this diagnosed? This condition may be diagnosed based on your symptoms and a physical exam. You may also have tests, such as:  Blood tests to check for an increase in white blood cells.  Blood cultures to look for bacteremia.  Culture and sensitivity testing. This is a test to find out what type of bacteria will grow from a sample of pus swabbed from the wound or skin infection. The results help determine which antibiotic medicines will kill the bacteria. How is this treated? Treatment for this condition may include:  Antibiotics.  You may be started on an antibiotic that is known to kill both streptococcus and staphylococcus bacteria.  Your antibiotics may need to be switched if tests show that your condition is caused by another type of bacteria.  If your infection is very bad or has spread to another area of your body, you may need to get antibiotics through an IV at the hospital.  Pain medicine.  Incision and drainage. This is a procedure that may be done at the hospital if pus needs to be drained from your wound. Follow these instructions at home:  Take over-the-counter and prescription medicines only as told by your health care provider.  Take your antibiotic medicine as told by your health care provider. Do not stop taking the antibiotic even if you start to feel better.  Rest at home until your health care provider says that  you can return to your normal activities.  Follow instructions from your health care provider about how to take care of your wound.  Raise (elevate) the affected area above the level of your heart while you are sitting or lying down.  Keep all follow-up visits as told by your health care provider. This is important. Contact a health care  provider if:  You have chills or a fever.  Your symptoms do not go away with treatment.  Your symptoms come back after treatment. This information is not intended to replace advice given to you by your health care provider. Make sure you discuss any questions you have with your health care provider. Document Released: 07/26/2015 Document Revised: 12/05/2015 Document Reviewed: 07/18/2014 Elsevier Interactive Patient Education  2017 [employer].

## 2016-12-23 DIAGNOSIS — G4733 Obstructive sleep apnea (adult) (pediatric): Secondary | ICD-10-CM | POA: Diagnosis not present

## 2017-01-21 DIAGNOSIS — G4733 Obstructive sleep apnea (adult) (pediatric): Secondary | ICD-10-CM | POA: Diagnosis not present

## 2017-01-22 DIAGNOSIS — G4733 Obstructive sleep apnea (adult) (pediatric): Secondary | ICD-10-CM | POA: Diagnosis not present

## 2017-01-27 ENCOUNTER — Encounter: Payer: Self-pay | Admitting: Internal Medicine

## 2017-01-27 ENCOUNTER — Ambulatory Visit: Payer: Medicare Other | Admitting: Internal Medicine

## 2017-02-01 ENCOUNTER — Other Ambulatory Visit (INDEPENDENT_AMBULATORY_CARE_PROVIDER_SITE_OTHER): Payer: Medicare Other

## 2017-02-01 ENCOUNTER — Encounter: Payer: Self-pay | Admitting: Internal Medicine

## 2017-02-01 ENCOUNTER — Ambulatory Visit (INDEPENDENT_AMBULATORY_CARE_PROVIDER_SITE_OTHER): Payer: Medicare Other | Admitting: Internal Medicine

## 2017-02-01 VITALS — BP 138/80 | HR 74 | Temp 98.1°F | Resp 12 | Ht 69.5 in | Wt 211.0 lb

## 2017-02-01 DIAGNOSIS — D510 Vitamin B12 deficiency anemia due to intrinsic factor deficiency: Secondary | ICD-10-CM | POA: Diagnosis not present

## 2017-02-01 DIAGNOSIS — E559 Vitamin D deficiency, unspecified: Secondary | ICD-10-CM

## 2017-02-01 DIAGNOSIS — D539 Nutritional anemia, unspecified: Secondary | ICD-10-CM | POA: Diagnosis not present

## 2017-02-01 DIAGNOSIS — E785 Hyperlipidemia, unspecified: Secondary | ICD-10-CM

## 2017-02-01 DIAGNOSIS — E118 Type 2 diabetes mellitus with unspecified complications: Secondary | ICD-10-CM

## 2017-02-01 DIAGNOSIS — I10 Essential (primary) hypertension: Secondary | ICD-10-CM

## 2017-02-01 LAB — IBC PANEL
Iron: 75 ug/dL (ref 42–165)
Saturation Ratios: 21.7 % (ref 20.0–50.0)
Transferrin: 247 mg/dL (ref 212.0–360.0)

## 2017-02-01 LAB — CBC WITH DIFFERENTIAL/PLATELET
Basophils Absolute: 0 10*3/uL (ref 0.0–0.1)
Basophils Relative: 0.7 % (ref 0.0–3.0)
Eosinophils Absolute: 0.3 10*3/uL (ref 0.0–0.7)
Eosinophils Relative: 5 % (ref 0.0–5.0)
HCT: 38 % — ABNORMAL LOW (ref 39.0–52.0)
Hemoglobin: 12.1 g/dL — ABNORMAL LOW (ref 13.0–17.0)
Lymphocytes Relative: 23.8 % (ref 12.0–46.0)
Lymphs Abs: 1.5 10*3/uL (ref 0.7–4.0)
MCHC: 31.9 g/dL (ref 30.0–36.0)
MCV: 85.6 fl (ref 78.0–100.0)
Monocytes Absolute: 0.7 10*3/uL (ref 0.1–1.0)
Monocytes Relative: 10.9 % (ref 3.0–12.0)
Neutro Abs: 3.7 10*3/uL (ref 1.4–7.7)
Neutrophils Relative %: 59.6 % (ref 43.0–77.0)
Platelets: 166 10*3/uL (ref 150.0–400.0)
RBC: 4.43 Mil/uL (ref 4.22–5.81)
RDW: 13.9 % (ref 11.5–15.5)
WBC: 6.2 10*3/uL (ref 4.0–10.5)

## 2017-02-01 LAB — HEMOGLOBIN A1C: Hgb A1c MFr Bld: 7.7 % — ABNORMAL HIGH (ref 4.6–6.5)

## 2017-02-01 LAB — FOLATE: Folate: 12.5 ng/mL (ref >5.9)

## 2017-02-01 LAB — MICROALBUMIN / CREATININE URINE RATIO
Creatinine,U: 123.6 mg/dL
Microalb Creat Ratio: 21 mg/g (ref 0.0–30.0)
Microalb, Ur: 26 mg/dL — ABNORMAL HIGH (ref 0.0–1.9)

## 2017-02-01 LAB — BASIC METABOLIC PANEL
BUN: 23 mg/dL (ref 6–23)
CO2: 28 mEq/L (ref 19–32)
Calcium: 9.4 mg/dL (ref 8.4–10.5)
Chloride: 101 mEq/L (ref 96–112)
Creatinine, Ser: 1.09 mg/dL (ref 0.40–1.50)
GFR: 69.47 mL/min (ref >60.00)
Glucose, Bld: 183 mg/dL — ABNORMAL HIGH (ref 70–99)
Potassium: 4.5 mEq/L (ref 3.5–5.1)
Sodium: 137 mEq/L (ref 135–145)

## 2017-02-01 LAB — LIPID PANEL
Cholesterol: 140 mg/dL (ref 0–200)
HDL: 50.2 mg/dL (ref >39.00)
LDL Cholesterol: 62 mg/dL (ref 0–99)
NonHDL: 90.25
Total CHOL/HDL Ratio: 3
Triglycerides: 140 mg/dL (ref 0.0–149.0)
VLDL: 28 mg/dL (ref 0.0–40.0)

## 2017-02-01 LAB — FERRITIN: Ferritin: 48.6 ng/mL (ref 22.0–322.0)

## 2017-02-01 LAB — VITAMIN B12: Vitamin B-12: 390 pg/mL (ref 211–911)

## 2017-02-01 LAB — VITAMIN D 25 HYDROXY (VIT D DEFICIENCY, FRACTURES): VITD: 37.47 ng/mL (ref 30.00–100.00)

## 2017-02-01 NOTE — Patient Instructions (Signed)

## 2017-02-01 NOTE — Progress Notes (Addendum)
Subjective:  Patient ID: Frank [name], male    DOB: 01-25-1941  Age: 76 y.o. MRN: [id number]  CC: Anemia; Hyperlipidemia; and Diabetes   HPI Frank [name] presents for f/up - He has a history of anemia and was given a B12 injections but it didn't improve his symptoms are quality of life so he stopped taking the B12 injection. Over the last year he has developed a slight decreased interest in food and is lost a few pounds. He denies odynophagia, dysphagia, abdominal pain, early satiety, nausea, vomiting, melena, or blood in his stools.  Outpatient Medications Prior to Visit  Medication Sig Dispense Refill  . aspirin 81 MG tablet Take 81 mg by mouth daily.      [name] atorvastatin (LIPITOR) 20 MG tablet Take 0.5 tablets (10 mg total) by mouth daily. 90 tablet 3  . cetirizine (ZYRTEC) 10 MG tablet Take 10 mg by mouth daily.    [name] doxazosin (CARDURA) 4 MG tablet Take 0.5 tablets (2 mg total) by mouth at bedtime. 90 tablet 3  . FLUoxetine (PROZAC) 20 MG capsule Take 1 capsule by mouth  daily 90 capsule 3  . glipiZIDE (GLUCOTROL XL) 10 MG 24 hr tablet TAKE 1 TABLET BY MOUTH  DAILY 90 tablet 1  . metFORMIN (GLUCOPHAGE) 1000 MG tablet TAKE 1 TABLET BY MOUTH TWO  TIMES DAILY 180 tablet 1  . oxybutynin (DITROPAN) 5 MG tablet Take 1 tablet (5 mg total) by mouth 2 (two) times daily. 180 tablet 1  . telmisartan (MICARDIS) 40 MG tablet TAKE 1 TABLET BY MOUTH  DAILY 90 tablet 1  . sitaGLIPtin (JANUVIA) 100 MG tablet Take 1 tablet (100 mg total) by mouth daily. (Patient not taking: Reported on 02/01/2017) 90 tablet 0   No facility-administered medications prior to visit.     ROS Review of Systems  Constitutional: Positive for appetite change and unexpected weight change. Negative for activity change, diaphoresis and fatigue.  HENT: Negative.  Negative for sinus pressure, sore throat and trouble swallowing.   Eyes: Negative for visual disturbance.  Respiratory: Negative.  Negative for cough,  chest tightness, shortness of breath and wheezing.   Cardiovascular: Negative.  Negative for chest pain, palpitations and leg swelling.  Gastrointestinal: Negative for abdominal pain, blood in stool, constipation, diarrhea, nausea and vomiting.  Endocrine: Negative.  Negative for polydipsia, polyphagia and polyuria.  Genitourinary: Negative.  Negative for difficulty urinating, dysuria, frequency and urgency.  Musculoskeletal: Negative.  Negative for back pain and myalgias.  Skin: Negative.  Negative for color change and rash.  Allergic/Immunologic: Negative.   Neurological: Negative.  Negative for dizziness, weakness and headaches.  Hematological: Negative for adenopathy. Does not bruise/bleed easily.  Psychiatric/Behavioral: Negative.     Objective:  BP 138/80 (BP Location: Left Arm, Patient Position: Sitting, Cuff Size: Normal)   Pulse 74   Temp 98.1 F (36.7 C) (Oral)   Resp 12   Ht 5' 9.5" (1.765 m)   Wt 211 lb (95.7 kg)   SpO2 96%   BMI 30.71 kg/m   BP Readings from Last 3 Encounters:  02/01/17 138/80  12/09/16 (!) 160/74  08/31/16 (!) 154/82    Wt Readings from Last 3 Encounters:  02/01/17 211 lb (95.7 kg)  12/09/16 214 lb (97.1 kg)  08/31/16 217 lb (98.4 kg)    Physical Exam  Constitutional: He is oriented to person, place, and time. No distress.  HENT:  Mouth/Throat: Oropharynx is clear and moist. No oropharyngeal exudate.  Eyes: Conjunctivae are  normal. Right eye exhibits no discharge. Left eye exhibits no discharge. No scleral icterus.  Neck: Normal range of motion. Neck supple. No JVD present. No thyromegaly present.  Cardiovascular: Normal rate, regular rhythm and intact distal pulses.  Exam reveals no gallop and no friction rub.   No murmur heard. Pulmonary/Chest: Effort normal. No accessory muscle usage. No respiratory distress. He has no decreased breath sounds. He has no wheezes. He has rales in the right lower field and the left lower field. He exhibits  no tenderness.  Abdominal: Soft. Bowel sounds are normal. He exhibits no distension and no mass. There is no tenderness. There is no rebound and no guarding.  Musculoskeletal: Normal range of motion. He exhibits no edema, tenderness or deformity.  Lymphadenopathy:    He has no cervical adenopathy.  Neurological: He is alert and oriented to person, place, and time.  Skin: Skin is warm and dry. No rash noted. He is not diaphoretic. No erythema. No pallor.  Vitals reviewed.   Lab Results  Component Value Date   WBC 6.2 02/01/2017   HGB 12.1 (L) 02/01/2017   HCT 38.0 (L) 02/01/2017   PLT 166.0 02/01/2017   GLUCOSE 183 (H) 02/01/2017   CHOL 140 02/01/2017   TRIG 140.0 02/01/2017   HDL 50.20 02/01/2017   LDLCALC 62 02/01/2017   ALT 17 12/03/2015   AST 15 12/03/2015   NA 137 02/01/2017   K 4.5 02/01/2017   CL 101 02/01/2017   CREATININE 1.09 02/01/2017   BUN 23 02/01/2017   CO2 28 02/01/2017   TSH 0.89 06/16/2016   PSA 1.08 07/10/2015   HGBA1C 7.7 (H) 02/01/2017   MICROALBUR 26.0 (H) 02/01/2017    Dg Chest 2 View  Result Date: 03/18/2016 CLINICAL DATA:  Wheezing with cough and congestion for 2 days EXAM: CHEST  2 VIEW COMPARISON:  December 19, 2003 FINDINGS: There is mild scarring in the bases. There is no edema or consolidation. The heart size and pulmonary vascularity are normal. There is atherosclerotic calcification in the aorta. There is also calcification in the mitral annulus. There is evidence of old rib trauma on the right with remodeling. There is postoperative change in the proximal left humerus. There is degenerative change in each shoulder. There is calcification in each carotid artery. IMPRESSION: Bibasilar scarring. No edema or consolidation. Aortic atherosclerosis. Remodeling of the anterior right fifth rib, stable. There is bilateral carotid artery calcification. Electronically Signed   By: Lowella Grip III M.D.   On: 03/18/2016 16:21    Assessment & Plan:   Frank [name]  was seen today for anemia, hyperlipidemia and diabetes.  Diagnoses and all orders for this visit:  Essential hypertension- his blood pressure is well-controlled, electrolytes and renal function are normal. -     Basic metabolic panel; Future  Type 2 diabetes mellitus with complication, without long-term current use of insulin ([place])- his A1c is up to 7.7%. At his age I don't think that he should try to achieve better blood sugar control. Will continue the combination of metformin and an SU. -     Basic metabolic panel; Future -     Hemoglobin A1c; Future -     Microalbumin / creatinine urine ratio; Future  Vitamin B12 deficiency anemia due to intrinsic factor deficiency- H&H have improved and his B12 level is normal off of B12 supplementation. -     CBC with Differential/Platelet; Future  Hyperlipidemia with target LDL less than 100- he has achieved his LDL goal and  is doing well on the statin. -     Lipid panel; Future  Vitamin D deficiency- this is normal now. -     VITAMIN D 25 Hydroxy (Vit-D Deficiency, Fractures); Future  Deficiency anemia- his H&H have improved, he is asymptomatic and his vitamin levels are normal. This is consistent with the anemia of chronic disease. -     IBC panel; Future -     Vitamin B12; Future -     Folate; Future -     Ferritin; Future   I have discontinued Mr. Proby sitaGLIPtin. I am also having him maintain his aspirin, cetirizine, FLUoxetine, oxybutynin, atorvastatin, doxazosin, telmisartan, glipiZIDE, and metFORMIN.  No orders of the defined types were placed in this encounter.    Follow-up: Return in about 6 months (around 08/04/2017).  Scarlette Calico, MD

## 2017-02-22 DIAGNOSIS — G4733 Obstructive sleep apnea (adult) (pediatric): Secondary | ICD-10-CM | POA: Diagnosis not present

## 2017-03-01 ENCOUNTER — Other Ambulatory Visit: Payer: Self-pay | Admitting: Internal Medicine

## 2017-03-01 DIAGNOSIS — N401 Enlarged prostate with lower urinary tract symptoms: Secondary | ICD-10-CM

## 2017-03-14 ENCOUNTER — Encounter (HOSPITAL_BASED_OUTPATIENT_CLINIC_OR_DEPARTMENT_OTHER): Payer: Self-pay

## 2017-03-14 ENCOUNTER — Emergency Department (HOSPITAL_BASED_OUTPATIENT_CLINIC_OR_DEPARTMENT_OTHER)
Admission: EM | Admit: 2017-03-14 | Discharge: 2017-03-14 | Disposition: A | Discharge status: Home / Self Care | Payer: Medicare Other | Attending: Emergency Medicine | Admitting: Emergency Medicine

## 2017-03-14 ENCOUNTER — Emergency Department (HOSPITAL_BASED_OUTPATIENT_CLINIC_OR_DEPARTMENT_OTHER): Payer: Medicare Other

## 2017-03-14 DIAGNOSIS — S0990XA Unspecified injury of head, initial encounter: Secondary | ICD-10-CM | POA: Diagnosis not present

## 2017-03-14 DIAGNOSIS — E1140 Type 2 diabetes mellitus with diabetic neuropathy, unspecified: Secondary | ICD-10-CM | POA: Insufficient documentation

## 2017-03-14 DIAGNOSIS — Y998 Other external cause status: Secondary | ICD-10-CM | POA: Insufficient documentation

## 2017-03-14 DIAGNOSIS — Y9222 Religious institution as the place of occurrence of the external cause: Secondary | ICD-10-CM | POA: Insufficient documentation

## 2017-03-14 DIAGNOSIS — Y9389 Activity, other specified: Secondary | ICD-10-CM | POA: Insufficient documentation

## 2017-03-14 DIAGNOSIS — Z87891 Personal history of nicotine dependence: Secondary | ICD-10-CM | POA: Diagnosis not present

## 2017-03-14 DIAGNOSIS — S4991XA Unspecified injury of right shoulder and upper arm, initial encounter: Secondary | ICD-10-CM | POA: Diagnosis present

## 2017-03-14 DIAGNOSIS — S42031A Displaced fracture of lateral end of right clavicle, initial encounter for closed fracture: Secondary | ICD-10-CM | POA: Diagnosis not present

## 2017-03-14 DIAGNOSIS — I10 Essential (primary) hypertension: Secondary | ICD-10-CM | POA: Diagnosis not present

## 2017-03-14 DIAGNOSIS — Z7984 Long term (current) use of oral hypoglycemic drugs: Secondary | ICD-10-CM | POA: Diagnosis not present

## 2017-03-14 DIAGNOSIS — Z7982 Long term (current) use of aspirin: Secondary | ICD-10-CM | POA: Diagnosis not present

## 2017-03-14 DIAGNOSIS — Z79899 Other long term (current) drug therapy: Secondary | ICD-10-CM | POA: Insufficient documentation

## 2017-03-14 DIAGNOSIS — M25511 Pain in right shoulder: Secondary | ICD-10-CM | POA: Diagnosis not present

## 2017-03-14 DIAGNOSIS — W01198A Fall on same level from slipping, tripping and stumbling with subsequent striking against other object, initial encounter: Secondary | ICD-10-CM | POA: Insufficient documentation

## 2017-03-14 DIAGNOSIS — S199XXA Unspecified injury of neck, initial encounter: Secondary | ICD-10-CM | POA: Diagnosis not present

## 2017-03-14 MED ORDER — HYDROCODONE-ACETAMINOPHEN 5-325 MG PO TABS
1.0000 | ORAL_TABLET | Freq: Once | ORAL | Status: AC
  Administered 2017-03-14: 1 via ORAL
  Filled 2017-03-14: qty 1

## 2017-03-14 MED ORDER — HYDROCODONE-ACETAMINOPHEN 5-325 MG PO TABS: 1.0000 | ORAL_TABLET | ORAL | 0 refills | Status: DC | PRN

## 2017-03-14 NOTE — Discharge Instructions (Signed)
You have a right clavicle fracture. Please wear sling for comfort. Please contact your primary care doctor or orthopedic surgeon for follow-up. Return for worsening symptoms, including escalating pain, new numbness or weakness, confusion, or any other symptoms concerning to you

## 2017-03-14 NOTE — ED Triage Notes (Signed)
Pt reports fall last night onto chair. Pt with pain to right shoulder with bruising.

## 2017-03-14 NOTE — ED Provider Notes (Signed)
[place] [facility] Provider Note   CSN: [id number] Arrival date & time: 03/14/17  1004     History   Chief Complaint Chief Complaint  Patient presents with  . Fall  . Shoulder Pain    HPI Frank [name] is a 76 y.o. male.  The history is provided by the patient.  Shoulder Pain   This is a new problem. The current episode started yesterday. The problem occurs constantly. The problem has not changed since onset.The pain is present in the right shoulder. The quality of the pain is described as aching. The pain is moderate. Associated symptoms include limited range of motion. Pertinent negatives include no numbness and no tingling. The symptoms are aggravated by activity. He has tried OTC pain medications for the symptoms. The treatment provided no relief. There has been a history of trauma.   76 year old male who presents with mechanical fall. He has a history of type 2 diabetes, hypertension and hyperlipidemia. Takes a baby aspirin but is not on blood thinners. Had a mechanical fall yesterday, and reports slipping and falling against [employer] at [employer]. He hit his right shoulder with notable bruising today. He did hit his head but did not have any headache, nausea vomiting, or loss of consciousness. Today with worsening right shoulder pain, not improved with Tylenol. Pain is worse with movement and palpation. No alleviating factors. No focal numbness or weakness, confusion, vision or speech changes, chest pain or difficulty breathing, abdominal pain or back pain. Has been ambulatory.   Past Medical History:  Diagnosis Date  . Allergy    Rhinitis  . Anemia    NOS iron deficient and B12 deficient  . Arthritis   . Diabetes mellitus    Type 2  . GERD (gastroesophageal reflux disease)   . Hyperlipidemia   . Hypertension   . 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 OSA (obstructive sleep apnea)    cpap    Patient Active Problem List   Diagnosis Date Noted  . Deficiency  anemia 06/16/2016  . OAB (overactive bladder) 11/26/2014  . Routine general medical examination at a health care facility 04/06/2014  . Vitamin D deficiency 07/23/2010  . Obstructive sleep apnea 09/13/2008  . Hyperlipidemia with target LDL less than 100 07/27/2007  . BPH (benign prostatic hyperplasia) 06/21/2007  . 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 Type II diabetes mellitus with manifestations ([place]) 04/15/2006  . B12 deficiency anemia 04/15/2006  . Essential hypertension 04/15/2006  . ALLERGIC RHINITIS 04/15/2006    Past Surgical History:  Procedure Laterality Date  . APPENDECTOMY    . arm fracture Left 2011   with hardware  . COLONOSCOPY    . ESOPHAGOGASTRODUODENOSCOPY  2006   gastritis  . ROTATOR CUFF REPAIR    . SEPTOPLASTY  1959   Deviated Septum  . 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 Authorizing Provider  aspirin 81 MG tablet Take 81 mg by mouth daily.      [provider]  atorvastatin (LIPITOR) 20 MG tablet Take 0.5 tablets (10 mg total) by mouth daily. 07/17/16   Janith Lima, MD  cetirizine (ZYRTEC) 10 MG tablet Take 10 mg by mouth daily.    [provider]  doxazosin (CARDURA) 4 MG tablet Take 0.5 tablets (2 mg total) by mouth at bedtime. 07/17/16   Janith Lima, MD  FLUoxetine (PROZAC) 20 MG capsule TAKE 1 CAPSULE BY MOUTH  DAILY 03/01/17   Janith Lima, MD  glipiZIDE (GLUCOTROL XL) 10 MG 24 hr tablet TAKE 1 TABLET BY MOUTH  DAILY 03/01/17   Janith Lima, MD  HYDROcodone-acetaminophen (NORCO/VICODIN) 5-325 MG tablet Take 1 tablet by mouth every 4 (four) hours as needed for moderate pain. 03/14/17   Forde Dandy, MD  metFORMIN (GLUCOPHAGE) 1000 MG tablet TAKE 1 TABLET BY MOUTH TWO  TIMES DAILY 10/18/16   Janith Lima, MD  oxybutynin (DITROPAN) 5 MG tablet TAKE 1 TABLET BY MOUTH TWO  TIMES DAILY 03/01/17   Janith Lima, MD    telmisartan (MICARDIS) 40 MG tablet TAKE 1 TABLET BY MOUTH  DAILY 03/01/17   Janith Lima, MD    Family History Family History  Problem Relation Age of Onset  . Heart disease Mother   . Breast cancer Mother   . Stroke Father   . Allergies Father        Father and children  . Coronary artery disease Brother   . Diabetes Neg Hx   . Colon cancer Neg Hx     Social History Social History  Substance Use Topics  . Smoking status: Former Smoker    Packs/day: 1.00    Quit date: 07/14/1983  . Smokeless tobacco: Never Used  . Alcohol use 1.0 oz/week    2 Standard drinks or equivalent per week     Comment: occassionally     Allergies   Lisinopril; Penicillins; and Sulfamethoxazole   Review of Systems Review of Systems  Constitutional: Negative for fever.  Respiratory: Negative for shortness of breath.   Cardiovascular: Negative for chest pain.  Gastrointestinal: Negative for abdominal pain.  Genitourinary: Negative for flank pain.  Musculoskeletal: Negative for back pain and neck pain.  Allergic/Immunologic: Negative for immunocompromised state.  Neurological: Negative for tingling, syncope, speech difficulty, weakness, numbness and headaches.  Hematological: Does not bruise/bleed easily.  Psychiatric/Behavioral: Negative for confusion.  All other systems reviewed and are negative.    Physical Exam Updated Vital Signs BP (!) 166/71 (BP Location: Left Arm)   Pulse 80   Temp 98.5 F (36.9 C) (Oral)   Resp 16   Ht 5\' 10"  (1.778 m)   Wt 96.6 kg (213 lb)   SpO2 98%   BMI 30.56 kg/m   Physical Exam Physical Exam  Nursing note and vitals reviewed. Constitutional: Well developed, well nourished, non-toxic, and in no acute distress Head: Normocephalic and atraumatic.  Mouth/Throat: Oropharynx is clear and moist.  Neck: Normal range of motion. Neck supple.  no cervical spine tenderness. Cardiovascular: Normal rate and regular rhythm.   +2 radial pulses  bilaterally Pulmonary/Chest: Effort normal and breath sounds normal.  no chest wall tenderness Abdominal: Soft. There is no tenderness. There is no rebound and no guarding.  Musculoskeletal: Bruising over the superior aspect of the right shoulder with limited range of motion due to pain. No obvious deformity.  normal range of motion of bilateral lower extremities and left upper extremity.  Neurological: Alert, no facial droop, fluent speech, bilateral equal hand grip, full strength bilateral lower extremities, normal gait, sensation to light touch intact throughout, PERRL, EOMI Skin: Skin is warm and dry.  Psychiatric: Cooperative   ED Treatments / Results  Labs (all labs ordered are listed, but only abnormal results are displayed) Labs Reviewed - No data to display  EKG  EKG Interpretation None       Radiology  Dg Shoulder Right  Result Date: 03/14/2017 CLINICAL DATA:  Right shoulder pain and bruising following a fall onto a chair last night. EXAM: RIGHT SHOULDER - 2+ VIEW COMPARISON:  Right shoulder MR dated 05/29/2005. Chest dated 03/18/2016. FINDINGS: Mild to moderate inferior glenohumeral spur formation. Right humeral head fixation anchor. Distal right clavicle fracture with superior displacement and inferior angulation of the distal fragment. IMPRESSION: 1. Distal right clavicle fracture, as described above. 2. Mild to moderate glenohumeral joint degenerative changes. Electronically Signed   By: Claudie Revering M.D.   On: 03/14/2017 11:25   Ct Head Wo Contrast  Result Date: 03/14/2017 CLINICAL DATA:  [place] yesterday.  Reported head injury. EXAM: CT HEAD WITHOUT CONTRAST CT CERVICAL SPINE WITHOUT CONTRAST TECHNIQUE: Multidetector CT imaging of the head and cervical spine was performed following the standard protocol without intravenous contrast. Multiplanar CT image reconstructions of the cervical spine were also generated. COMPARISON:  Head CT dated 02/09/2014. FINDINGS: CT HEAD FINDINGS  Brain: Mildly enlarged subarachnoid spaces. Normal size and position of the ventricles. Patchy white matter low density in both cerebral hemispheres. No intracranial hemorrhage, mass lesion or CT evidence of acute infarction. Vascular: No hyperdense vessel or unexpected calcification. Skull: Normal. Negative for fracture or focal lesion. Sinuses/Orbits: Status post bilateral cataract extraction. Mild bilateral ethmoid and sphenoid sinus mucosal thickening. Other: None. CT CERVICAL SPINE FINDINGS Alignment: Straightening of the normal cervical lordosis. Skull base and vertebrae: No acute fracture. No primary bone lesion or focal pathologic process. Soft tissues and spinal canal: No prevertebral fluid or swelling. No visible canal hematoma. Disc levels:  Multilevel degenerative changes. Upper chest: Clear lung apices. Other: Dense bilateral carotid artery calcifications. IMPRESSION: 1. No skull fracture or intracranial hemorrhage. 2. No cervical spine fracture or subluxation. 3. Mild diffuse cerebral and cerebellar cortical atrophy. 4. Minimal chronic small vessel white matter ischemic changes in both cerebral hemispheres. 5. Multilevel cervical spine degenerative changes. 6. Dense bilateral carotid artery atheromatous calcifications. Electronically Signed   By: Claudie Revering M.D.   On: 03/14/2017 11:40   Ct Cervical Spine Wo Contrast  Result Date: 03/14/2017 CLINICAL DATA:  [place] yesterday.  Reported head injury. EXAM: CT HEAD WITHOUT CONTRAST CT CERVICAL SPINE WITHOUT CONTRAST TECHNIQUE: Multidetector CT imaging of the head and cervical spine was performed following the standard protocol without intravenous contrast. Multiplanar CT image reconstructions of the cervical spine were also generated. COMPARISON:  Head CT dated 02/09/2014. FINDINGS: CT HEAD FINDINGS Brain: Mildly enlarged subarachnoid spaces. Normal size and position of the ventricles. Patchy white matter low density in both cerebral hemispheres. No  intracranial hemorrhage, mass lesion or CT evidence of acute infarction. Vascular: No hyperdense vessel or unexpected calcification. Skull: Normal. Negative for fracture or focal lesion. Sinuses/Orbits: Status post bilateral cataract extraction. Mild bilateral ethmoid and sphenoid sinus mucosal thickening. Other: None. CT CERVICAL SPINE FINDINGS Alignment: Straightening of the normal cervical lordosis. Skull base and vertebrae: No acute fracture. No primary bone lesion or focal pathologic process. Soft tissues and spinal canal: No prevertebral fluid or swelling. No visible canal hematoma. Disc levels:  Multilevel degenerative changes. Upper chest: Clear lung apices. Other: Dense bilateral carotid artery calcifications. IMPRESSION: 1. No skull fracture or intracranial hemorrhage. 2. No cervical spine fracture or subluxation. 3. Mild diffuse cerebral and cerebellar cortical atrophy. 4. Minimal chronic small vessel white matter ischemic changes in both cerebral hemispheres. 5. Multilevel cervical spine degenerative changes. 6. Dense bilateral carotid artery atheromatous calcifications. Electronically Signed   By: Percell Locus.D.  On: 03/14/2017 11:40    Procedures Procedures (including critical care time)  Medications Ordered in ED Medications  HYDROcodone-acetaminophen (NORCO/VICODIN) 5-325 MG per tablet 1 tablet (1 tablet Oral Given 03/14/17 1030)     Initial Impression / Assessment and Plan / ED Course  I have reviewed the triage vital signs and the nursing notes.  Pertinent labs & imaging results that were available during my care of the patient were reviewed by me and considered in my medical decision making (see chart for details).     Presents after mechanical fall one day ago with right shoulder pain. X-ray of the shoulder reveals distal right clavicle fracture. He is neurologically intact, mentating well, has no other signs or symptoms of injury. He did undergo CT head and cervical spine  that shows no acute traumatic head or neck injury. I he is placed in sling. Provide pain control. He does have his own orthopedic surgeon that he has seen before and primary care doctor that he can follow-up with. Strict return and follow-up instructions reviewed. He expressed understanding of all discharge instructions and felt comfortable with the plan of care.   Final Clinical Impressions(s) / ED Diagnoses   Final diagnoses:  Closed displaced fracture of acromial end of right clavicle, initial encounter    New Prescriptions New Prescriptions   HYDROCODONE-ACETAMINOPHEN (NORCO/VICODIN) 5-325 MG TABLET    Take 1 tablet by mouth every 4 (four) hours as needed for moderate pain.     Forde Dandy, MD 03/14/17 [phone]

## 2017-03-16 DIAGNOSIS — S42021A Displaced fracture of shaft of right clavicle, initial encounter for closed fracture: Secondary | ICD-10-CM | POA: Diagnosis not present

## 2017-03-19 DIAGNOSIS — Z961 Presence of intraocular lens: Secondary | ICD-10-CM | POA: Diagnosis not present

## 2017-03-25 DIAGNOSIS — S42021D Displaced fracture of shaft of right clavicle, subsequent encounter for fracture with routine healing: Secondary | ICD-10-CM | POA: Diagnosis not present

## 2017-03-25 DIAGNOSIS — G4733 Obstructive sleep apnea (adult) (pediatric): Secondary | ICD-10-CM | POA: Diagnosis not present

## 2017-04-05 DIAGNOSIS — S42021D Displaced fracture of shaft of right clavicle, subsequent encounter for fracture with routine healing: Secondary | ICD-10-CM | POA: Diagnosis not present

## 2017-04-12 DIAGNOSIS — D2261 Melanocytic nevi of right upper limb, including shoulder: Secondary | ICD-10-CM | POA: Diagnosis not present

## 2017-04-12 DIAGNOSIS — L814 Other melanin hyperpigmentation: Secondary | ICD-10-CM | POA: Diagnosis not present

## 2017-04-12 DIAGNOSIS — L821 Other seborrheic keratosis: Secondary | ICD-10-CM | POA: Diagnosis not present

## 2017-04-12 DIAGNOSIS — D225 Melanocytic nevi of trunk: Secondary | ICD-10-CM | POA: Diagnosis not present

## 2017-04-12 DIAGNOSIS — L570 Actinic keratosis: Secondary | ICD-10-CM | POA: Diagnosis not present

## 2017-04-12 DIAGNOSIS — D692 Other nonthrombocytopenic purpura: Secondary | ICD-10-CM | POA: Diagnosis not present

## 2017-04-17 ENCOUNTER — Encounter: Payer: Self-pay | Admitting: Internal Medicine

## 2017-04-21 DIAGNOSIS — G4733 Obstructive sleep apnea (adult) (pediatric): Secondary | ICD-10-CM | POA: Diagnosis not present

## 2017-04-22 DIAGNOSIS — S42021D Displaced fracture of shaft of right clavicle, subsequent encounter for fracture with routine healing: Secondary | ICD-10-CM | POA: Diagnosis not present

## 2017-04-24 DIAGNOSIS — G4733 Obstructive sleep apnea (adult) (pediatric): Secondary | ICD-10-CM | POA: Diagnosis not present

## 2017-05-04 ENCOUNTER — Encounter: Payer: Self-pay | Admitting: Internal Medicine

## 2017-05-04 ENCOUNTER — Other Ambulatory Visit (INDEPENDENT_AMBULATORY_CARE_PROVIDER_SITE_OTHER): Payer: Medicare Other

## 2017-05-04 ENCOUNTER — Ambulatory Visit (INDEPENDENT_AMBULATORY_CARE_PROVIDER_SITE_OTHER): Payer: Medicare Other | Admitting: Internal Medicine

## 2017-05-04 VITALS — BP 134/82 | HR 67 | Temp 98.3°F | Resp 16 | Ht 70.0 in | Wt 216.0 lb

## 2017-05-04 DIAGNOSIS — D510 Vitamin B12 deficiency anemia due to intrinsic factor deficiency: Secondary | ICD-10-CM | POA: Diagnosis not present

## 2017-05-04 DIAGNOSIS — E559 Vitamin D deficiency, unspecified: Secondary | ICD-10-CM

## 2017-05-04 DIAGNOSIS — Z23 Encounter for immunization: Secondary | ICD-10-CM | POA: Diagnosis not present

## 2017-05-04 DIAGNOSIS — N3281 Overactive bladder: Secondary | ICD-10-CM

## 2017-05-04 DIAGNOSIS — I10 Essential (primary) hypertension: Secondary | ICD-10-CM

## 2017-05-04 DIAGNOSIS — N401 Enlarged prostate with lower urinary tract symptoms: Secondary | ICD-10-CM

## 2017-05-04 DIAGNOSIS — E118 Type 2 diabetes mellitus with unspecified complications: Secondary | ICD-10-CM | POA: Diagnosis not present

## 2017-05-04 LAB — BASIC METABOLIC PANEL
BUN: 22 mg/dL (ref 6–23)
CO2: 31 mEq/L (ref 19–32)
Calcium: 9.6 mg/dL (ref 8.4–10.5)
Chloride: 98 mEq/L (ref 96–112)
Creatinine, Ser: 1.11 mg/dL (ref 0.40–1.50)
GFR: 67.99 mL/min (ref >60.00)
Glucose, Bld: 154 mg/dL — ABNORMAL HIGH (ref 70–99)
Potassium: 4.5 mEq/L (ref 3.5–5.1)
Sodium: 137 mEq/L (ref 135–145)

## 2017-05-04 LAB — CBC WITH DIFFERENTIAL/PLATELET
Basophils Absolute: 0 10*3/uL (ref 0.0–0.1)
Basophils Relative: 0.8 % (ref 0.0–3.0)
Eosinophils Absolute: 0.3 10*3/uL (ref 0.0–0.7)
Eosinophils Relative: 4.3 % (ref 0.0–5.0)
HCT: 37.8 % — ABNORMAL LOW (ref 39.0–52.0)
Hemoglobin: 12.2 g/dL — ABNORMAL LOW (ref 13.0–17.0)
Lymphocytes Relative: 22.7 % (ref 12.0–46.0)
Lymphs Abs: 1.4 10*3/uL (ref 0.7–4.0)
MCHC: 32.3 g/dL (ref 30.0–36.0)
MCV: 85.4 fl (ref 78.0–100.0)
Monocytes Absolute: 0.7 10*3/uL (ref 0.1–1.0)
Monocytes Relative: 11.4 % (ref 3.0–12.0)
Neutro Abs: 3.8 10*3/uL (ref 1.4–7.7)
Neutrophils Relative %: 60.8 % (ref 43.0–77.0)
Platelets: 181 10*3/uL (ref 150.0–400.0)
RBC: 4.43 Mil/uL (ref 4.22–5.81)
RDW: 14 % (ref 11.5–15.5)
WBC: 6.2 10*3/uL (ref 4.0–10.5)

## 2017-05-04 LAB — VITAMIN B12: Vitamin B-12: 398 pg/mL (ref 211–911)

## 2017-05-04 LAB — FOLATE: Folate: 16.7 ng/mL (ref >5.9)

## 2017-05-04 LAB — HEMOGLOBIN A1C: Hgb A1c MFr Bld: 7.2 % — ABNORMAL HIGH (ref 4.6–6.5)

## 2017-05-04 LAB — VITAMIN D 25 HYDROXY (VIT D DEFICIENCY, FRACTURES): VITD: 34.79 ng/mL (ref 30.00–100.00)

## 2017-05-04 NOTE — Patient Instructions (Signed)
Diabetes Mellitus and Food It is important for you to manage your blood sugar (glucose) level. Your blood glucose level can be greatly affected by what you eat. Eating healthier foods in the appropriate amounts throughout the day at about the same time each day will help you control your blood glucose level. It can also help slow or prevent worsening of your diabetes mellitus. Healthy eating may even help you improve the level of your blood pressure and reach or maintain a healthy weight. General recommendations for healthful eating and cooking habits include:  Eating meals and snacks regularly. Avoid going long periods of time without eating to lose weight.  Eating a diet that consists mainly of plant-based foods, such as fruits, vegetables, nuts, legumes, and whole grains.  Using low-heat cooking methods, such as baking, instead of high-heat cooking methods, such as deep frying.  Work with your dietitian to make sure you understand how to use the Nutrition Facts information on food labels. How can food affect me? Carbohydrates Carbohydrates affect your blood glucose level more than any other type of food. Your dietitian will help you determine how many carbohydrates to eat at each meal and teach you how to count carbohydrates. Counting carbohydrates is important to keep your blood glucose at a healthy level, especially if you are using insulin or taking certain medicines for diabetes mellitus. Alcohol Alcohol can cause sudden decreases in blood glucose (hypoglycemia), especially if you use insulin or take certain medicines for diabetes mellitus. Hypoglycemia can be a life-threatening condition. Symptoms of hypoglycemia (sleepiness, dizziness, and disorientation) are similar to symptoms of having too much alcohol. If your health care provider has given you approval to drink alcohol, do so in moderation and use the following guidelines:  Women should not have more than one drink per day, and men  should not have more than two drinks per day. One drink is equal to: ? 12 oz of beer. ? 5 oz of wine. ? 1 oz of hard liquor.  Do not drink on an empty stomach.  Keep yourself hydrated. Have water, diet soda, or unsweetened iced tea.  Regular soda, juice, and other mixers might contain a lot of carbohydrates and should be counted.  What foods are not recommended? As you make food choices, it is important to remember that all foods are not the same. Some foods have fewer nutrients per serving than other foods, even though they might have the same number of calories or carbohydrates. It is difficult to get your body what it needs when you eat foods with fewer nutrients. Examples of foods that you should avoid that are high in calories and carbohydrates but low in nutrients include:  Trans fats (most processed foods list trans fats on the Nutrition Facts label).  Regular soda.  Juice.  Candy.  Sweets, such as cake, pie, doughnuts, and cookies.  Fried foods.  What foods can I eat? Eat nutrient-rich foods, which will nourish your body and keep you healthy. The food you should eat also will depend on several factors, including:  The calories you need.  The medicines you take.  Your weight.  Your blood glucose level.  Your blood pressure level.  Your cholesterol level.  You should eat a variety of foods, including:  Protein. ? Lean cuts of meat. ? Proteins low in saturated fats, such as fish, egg whites, and beans. Avoid processed meats.  Fruits and vegetables. ? Fruits and vegetables that may help control blood glucose levels, such as apples,   mangoes, and yams.  Dairy products. ? Choose fat-free or low-fat dairy products, such as milk, yogurt, and cheese.  Grains, bread, pasta, and rice. ? Choose whole grain products, such as multigrain bread, whole oats, and brown rice. These foods may help control blood pressure.  Fats. ? Foods containing healthful fats, such as  nuts, avocado, olive oil, canola oil, and fish.  Does everyone with diabetes mellitus have the same meal plan? Because every person with diabetes mellitus is different, there is not one meal plan that works for everyone. It is very important that you meet with a dietitian who will help you create a meal plan that is just right for you. This information is not intended to replace advice given to you by your health care provider. Make sure you discuss any questions you have with your health care provider. Document Released: 03/26/2005 Document Revised: 12/05/2015 Document Reviewed: 05/26/2013 [employer] Interactive Patient Education  2017 [employer]  

## 2017-05-04 NOTE — Progress Notes (Signed)
Subjective:  Patient ID: Frank [name], male    DOB: [birth year]  Age: 76 y.o. MRN: [id number]  CC: Anemia; Hypertension; and Diabetes   HPI Frank [name] presents for f/up -he wants to recheck his vitamin D level.  He offers no complaints today.  Outpatient Medications Prior to Visit  Medication Sig Dispense Refill  . aspirin 81 MG tablet Take 81 mg by mouth daily.      [name] atorvastatin (LIPITOR) 20 MG tablet Take 0.5 tablets (10 mg total) by mouth daily. 90 tablet 3  . cetirizine (ZYRTEC) 10 MG tablet Take 10 mg by mouth daily.    [name] doxazosin (CARDURA) 4 MG tablet Take 0.5 tablets (2 mg total) by mouth at bedtime. 90 tablet 3  . FLUoxetine (PROZAC) 20 MG capsule TAKE 1 CAPSULE BY MOUTH  DAILY 90 capsule 1  . glipiZIDE (GLUCOTROL XL) 10 MG 24 hr tablet TAKE 1 TABLET BY MOUTH  DAILY 90 tablet 1  . metFORMIN (GLUCOPHAGE) 1000 MG tablet TAKE 1 TABLET BY MOUTH TWO  TIMES DAILY 180 tablet 1  . oxybutynin (DITROPAN) 5 MG tablet TAKE 1 TABLET BY MOUTH TWO  TIMES DAILY 180 tablet 1  . telmisartan (MICARDIS) 40 MG tablet TAKE 1 TABLET BY MOUTH  DAILY 90 tablet 1  . HYDROcodone-acetaminophen (NORCO/VICODIN) 5-325 MG tablet Take 1 tablet by mouth every 4 (four) hours as needed for moderate pain. 15 tablet 0   No facility-administered medications prior to visit.     ROS Review of Systems  Constitutional: Negative.  Negative for chills, diaphoresis, fatigue and fever.  HENT: Negative.   Eyes: Negative.   Respiratory: Negative.  Negative for cough, chest tightness, shortness of breath and wheezing.   Cardiovascular: Negative.  Negative for chest pain, palpitations and leg swelling.  Gastrointestinal: Negative.  Negative for abdominal pain, constipation, diarrhea, nausea and vomiting.  Endocrine: Negative.  Negative for polyphagia and polyuria.  Genitourinary: Negative.  Negative for difficulty urinating.  Musculoskeletal: Negative.  Negative for arthralgias, joint swelling and  myalgias.  Skin: Negative.   Allergic/Immunologic: Negative.   Neurological: Negative.   Hematological: Negative for adenopathy. Does not bruise/bleed easily.    Objective:  BP 134/82 (BP Location: Left Arm, Patient Position: Sitting, Cuff Size: Normal)   Pulse 67   Temp 98.3 F (36.8 C) (Oral)   Resp 16   Ht 5\' 10"  (1.778 m)   Wt 216 lb (98 kg)   SpO2 94%   BMI 30.99 kg/m   BP Readings from Last 3 Encounters:  05/04/17 134/82  03/14/17 (!) 150/84  02/01/17 138/80    Wt Readings from Last 3 Encounters:  05/04/17 216 lb (98 kg)  03/14/17 213 lb (96.6 kg)  02/01/17 211 lb (95.7 kg)    Physical Exam  Constitutional: He is oriented to person, place, and time. No distress.  HENT:  Mouth/Throat: Oropharynx is clear and moist.  Eyes: Conjunctivae are normal. Right eye exhibits no discharge. Left eye exhibits no discharge. No scleral icterus.  Neck: Normal range of motion. Neck supple. No JVD present. No thyromegaly present.  Cardiovascular: Normal rate, regular rhythm and intact distal pulses.  Exam reveals no gallop and no friction rub.   No murmur heard. Pulmonary/Chest: Effort normal and breath sounds normal. No respiratory distress. He has no wheezes. He has no rales. He exhibits no tenderness.  Abdominal: Soft. Bowel sounds are normal. He exhibits no distension and no mass. There is no tenderness. There is no rebound and no  guarding.  Musculoskeletal: Normal range of motion. He exhibits no edema, tenderness or deformity.  Lymphadenopathy:    He has no cervical adenopathy.  Neurological: He is alert and oriented to person, place, and time.  Skin: Skin is warm and dry. No rash noted. He is not diaphoretic. No erythema. No pallor.  Vitals reviewed.   Lab Results  Component Value Date   WBC 6.2 05/04/2017   HGB 12.2 (L) 05/04/2017   HCT 37.8 (L) 05/04/2017   PLT 181.0 05/04/2017   GLUCOSE 154 (H) 05/04/2017   CHOL 140 02/01/2017   TRIG 140.0 02/01/2017   HDL 50.20  02/01/2017   LDLCALC 62 02/01/2017   ALT 17 12/03/2015   AST 15 12/03/2015   NA 137 05/04/2017   K 4.5 05/04/2017   CL 98 05/04/2017   CREATININE 1.11 05/04/2017   BUN 22 05/04/2017   CO2 31 05/04/2017   TSH 0.89 06/16/2016   PSA 1.08 07/10/2015   HGBA1C 7.2 (H) 05/04/2017   MICROALBUR 26.0 (H) 02/01/2017    Dg Shoulder Right  Result Date: 03/14/2017 CLINICAL DATA:  Right shoulder pain and bruising following a fall onto a chair last night. EXAM: RIGHT SHOULDER - 2+ VIEW COMPARISON:  Right shoulder MR dated 05/29/2005. Chest dated 03/18/2016. FINDINGS: Mild to moderate inferior glenohumeral spur formation. Right humeral head fixation anchor. Distal right clavicle fracture with superior displacement and inferior angulation of the distal fragment. IMPRESSION: 1. Distal right clavicle fracture, as described above. 2. Mild to moderate glenohumeral joint degenerative changes. Electronically Signed   By: Claudie Revering M.D.   On: 03/14/2017 11:25   Ct Head Wo Contrast  Result Date: 03/14/2017 CLINICAL DATA:  [place] yesterday.  Reported head injury. EXAM: CT HEAD WITHOUT CONTRAST CT CERVICAL SPINE WITHOUT CONTRAST TECHNIQUE: Multidetector CT imaging of the head and cervical spine was performed following the standard protocol without intravenous contrast. Multiplanar CT image reconstructions of the cervical spine were also generated. COMPARISON:  Head CT dated 02/09/2014. FINDINGS: CT HEAD FINDINGS Brain: Mildly enlarged subarachnoid spaces. Normal size and position of the ventricles. Patchy white matter low density in both cerebral hemispheres. No intracranial hemorrhage, mass lesion or CT evidence of acute infarction. Vascular: No hyperdense vessel or unexpected calcification. Skull: Normal. Negative for fracture or focal lesion. Sinuses/Orbits: Status post bilateral cataract extraction. Mild bilateral ethmoid and sphenoid sinus mucosal thickening. Other: None. CT CERVICAL SPINE FINDINGS Alignment:  Straightening of the normal cervical lordosis. Skull base and vertebrae: No acute fracture. No primary bone lesion or focal pathologic process. Soft tissues and spinal canal: No prevertebral fluid or swelling. No visible canal hematoma. Disc levels:  Multilevel degenerative changes. Upper chest: Clear lung apices. Other: Dense bilateral carotid artery calcifications. IMPRESSION: 1. No skull fracture or intracranial hemorrhage. 2. No cervical spine fracture or subluxation. 3. Mild diffuse cerebral and cerebellar cortical atrophy. 4. Minimal chronic small vessel white matter ischemic changes in both cerebral hemispheres. 5. Multilevel cervical spine degenerative changes. 6. Dense bilateral carotid artery atheromatous calcifications. Electronically Signed   By: Claudie Revering M.D.   On: 03/14/2017 11:40   Ct Cervical Spine Wo Contrast  Result Date: 03/14/2017 CLINICAL DATA:  [place] yesterday.  Reported head injury. EXAM: CT HEAD WITHOUT CONTRAST CT CERVICAL SPINE WITHOUT CONTRAST TECHNIQUE: Multidetector CT imaging of the head and cervical spine was performed following the standard protocol without intravenous contrast. Multiplanar CT image reconstructions of the cervical spine were also generated. COMPARISON:  Head CT dated 02/09/2014. FINDINGS: CT HEAD FINDINGS Brain:  Mildly enlarged subarachnoid spaces. Normal size and position of the ventricles. Patchy white matter low density in both cerebral hemispheres. No intracranial hemorrhage, mass lesion or CT evidence of acute infarction. Vascular: No hyperdense vessel or unexpected calcification. Skull: Normal. Negative for fracture or focal lesion. Sinuses/Orbits: Status post bilateral cataract extraction. Mild bilateral ethmoid and sphenoid sinus mucosal thickening. Other: None. CT CERVICAL SPINE FINDINGS Alignment: Straightening of the normal cervical lordosis. Skull base and vertebrae: No acute fracture. No primary bone lesion or focal pathologic process. Soft tissues  and spinal canal: No prevertebral fluid or swelling. No visible canal hematoma. Disc levels:  Multilevel degenerative changes. Upper chest: Clear lung apices. Other: Dense bilateral carotid artery calcifications. IMPRESSION: 1. No skull fracture or intracranial hemorrhage. 2. No cervical spine fracture or subluxation. 3. Mild diffuse cerebral and cerebellar cortical atrophy. 4. Minimal chronic small vessel white matter ischemic changes in both cerebral hemispheres. 5. Multilevel cervical spine degenerative changes. 6. Dense bilateral carotid artery atheromatous calcifications. Electronically Signed   By: Claudie Revering M.D.   On: 03/14/2017 11:40    Assessment & Plan:   Frank [name] was seen today for anemia, hypertension and diabetes.  Diagnoses and all orders for this visit:  Essential hypertension-his blood pressure is well controlled.  Electrolytes and renal function are stable. -     Basic metabolic panel; Future  Type 2 diabetes mellitus with complication, without long-term current use of insulin ([name])-his A1c is at 7.2%.  His blood sugars are adequately well controlled. -     Basic metabolic panel; Future -     Hemoglobin A1c; Future  Vitamin B12 deficiency anemia due to intrinsic factor deficiency- his H&H are stable despite adequate B12 repletion.  It looks like he also has a component of anemia of chronic disease.  Will continue to monitor this. -     Vitamin B12; Future -     Folate; Future -     CBC with Differential/Platelet; Future  Vitamin D deficiency-his vitamin D level is normal. -     VITAMIN D 25 Hydroxy (Vit-D Deficiency, Fractures); Future  OAB (overactive bladder)  Benign prostatic hyperplasia with lower urinary tract symptoms, symptom details unspecified  Need for influenza vaccination -     Flu vaccine HIGH DOSE PF (Fluzone High dose)  Need for pneumococcal vaccination -     Pneumococcal polysaccharide vaccine 23-valent greater than or equal to 2yo  subcutaneous/IM   I have discontinued Mr. Longest HYDROcodone-acetaminophen. I am also having him maintain his aspirin, cetirizine, atorvastatin, doxazosin, metFORMIN, oxybutynin, telmisartan, FLUoxetine, glipiZIDE, and JANUVIA.  Meds ordered this encounter  Medications  . JANUVIA 100 MG tablet     Follow-up: Return in about 6 months (around 11/02/2017).  Scarlette Calico, MD

## 2017-05-12 ENCOUNTER — Encounter: Payer: Self-pay | Admitting: Adult Health

## 2017-05-12 ENCOUNTER — Ambulatory Visit (INDEPENDENT_AMBULATORY_CARE_PROVIDER_SITE_OTHER): Payer: Medicare Other | Admitting: Adult Health

## 2017-05-12 DIAGNOSIS — G4733 Obstructive sleep apnea (adult) (pediatric): Secondary | ICD-10-CM | POA: Diagnosis not present

## 2017-05-12 NOTE — Patient Instructions (Signed)
Continue on CPAP At bedtime   Keep up good job.  Work on [employer] and weight .  Do not drive if sleepy  Follow up with Dr. Elsworth Soho  In 1 year and As needed

## 2017-05-12 NOTE — Assessment & Plan Note (Signed)
Well controlled on CPAP   Plan  Patient Instructions  Continue on CPAP At bedtime   Keep up good job.  Work on [employer] and weight .  Do not drive if sleepy  Follow up with Dr. Elsworth Soho  In 1 year and As needed

## 2017-05-12 NOTE — Progress Notes (Signed)
@Patient  ID: Frank [name], male    DOB: 1941-05-02, 76 y.o.   MRN: [id number]  Chief Complaint  Patient presents with  . Follow-up    OSA    Referring provider: Janith Lima, MD  HPI: 76 yo male followed for OSA   TEST  PSG in 2006 showed AHI of 34/hour with desaturation to 72%.   05/12/2017 Follow up : OSA  Pt returns for a 1 year follow up for sleep apnea. He says he is doing well on CPAP at bedtime . He feels rested with no significant daytime sleepiness. He wears CPAP each night, never misses a night. Download shows excellent compliance with avg usage at 7 .5 hr each night . AHI 2.9 , + leaks.  On CPAP 10cmH2O .  Discussed weight loss and healthy diet.    Allergies  Allergen Reactions  . Lisinopril Cough  . Penicillins     REACTION: unspecified  . Sulfamethoxazole     REACTION: as child    Immunization History  Administered Date(s) Administered  . Influenza Split 05/06/2011, 03/23/2012  . Influenza Whole 04/17/2009, 04/16/2010  . Influenza, High Dose Seasonal PF 03/11/2016, 05/04/2017  . Influenza,inj,Quad PF,6+ Mos 04/04/2013, 04/06/2014, 03/06/2015  . Pneumococcal Conjugate-13 11/01/2013  . Pneumococcal Polysaccharide-23 07/13/2001, 09/17/2010, 05/04/2017  . Td 01/16/2010  . Zoster 05/06/2011    Past Medical History:  Diagnosis Date  . Allergy    Rhinitis  . Anemia    NOS iron deficient and B12 deficient  . Arthritis   . Diabetes mellitus    Type 2  . GERD (gastroesophageal reflux disease)   . Hyperlipidemia   . Hypertension   . 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 OSA (obstructive sleep apnea)    cpap    Tobacco History: History  Smoking Status  . Former Smoker  . Packs/day: 1.00  . Quit date: 07/14/1983  Smokeless Tobacco  . Never Used   Counseling given: Not Answered   Outpatient Encounter Prescriptions as of 05/12/2017  Medication Sig  . aspirin 81 MG tablet Take 81 mg by mouth daily.    [name] atorvastatin (LIPITOR)  20 MG tablet Take 0.5 tablets (10 mg total) by mouth daily.  . cetirizine (ZYRTEC) 10 MG tablet Take 10 mg by mouth daily.  [name] doxazosin (CARDURA) 4 MG tablet Take 0.5 tablets (2 mg total) by mouth at bedtime.  [name] FLUoxetine (PROZAC) 20 MG capsule TAKE 1 CAPSULE BY MOUTH  DAILY  . glipiZIDE (GLUCOTROL XL) 10 MG 24 hr tablet TAKE 1 TABLET BY MOUTH  DAILY  . JANUVIA 100 MG tablet   . metFORMIN (GLUCOPHAGE) 1000 MG tablet TAKE 1 TABLET BY MOUTH TWO  TIMES DAILY  . oxybutynin (DITROPAN) 5 MG tablet TAKE 1 TABLET BY MOUTH TWO  TIMES DAILY  . telmisartan (MICARDIS) 40 MG tablet TAKE 1 TABLET BY MOUTH  DAILY   No facility-administered encounter medications on file as of 05/12/2017.      Review of Systems  Constitutional:   No  weight loss, night sweats,  Fevers, chills, fatigue, or  lassitude.  HEENT:   No headaches,  Difficulty swallowing,  Tooth/dental problems, or  Sore throat,                No sneezing, itching, ear ache, nasal congestion, post nasal drip,   CV:  No chest pain,  Orthopnea, PND, swelling in lower extremities, anasarca, dizziness, palpitations, syncope.   GI  No heartburn, indigestion, abdominal pain,  nausea, vomiting, diarrhea, change in bowel habits, loss of appetite, bloody stools.   Resp: No shortness of breath with exertion or at rest.  No excess mucus, no productive cough,  No non-productive cough,  No coughing up of blood.  No change in color of mucus.  No wheezing.  No chest wall deformity  Skin: no rash or lesions.  GU: no dysuria, change in color of urine, no urgency or frequency.  No flank pain, no hematuria   MS:  No joint pain or swelling.  No decreased range of motion.  No back pain.    Physical Exam  BP 132/68 (BP Location: Left Arm, Cuff Size: Normal)   Pulse 74   SpO2 93%   GEN: A/Ox3; pleasant , NAD, obese    HEENT:  [place]/AT,  EACs-clear, TMs-wnl, NOSE-clear, THROAT-clear, no lesions, no postnasal drip or exudate noted. Class 2 MP airway   NECK:   Supple w/ fair ROM; no JVD; normal carotid impulses w/o bruits; no thyromegaly or nodules palpated; no lymphadenopathy.    RESP  Clear  P & A; w/o, wheezes/ rales/ or rhonchi. no accessory muscle use, no dullness to percussion  CARD:  RRR, no m/r/g, no peripheral edema, pulses intact, no cyanosis or clubbing.  GI:   Soft & nt; nml bowel sounds; no organomegaly or masses detected.   Musco: Warm bil, no deformities or joint swelling noted.   Neuro: alert, no focal deficits noted.    Skin: Warm, no lesions or rashes    Lab Results:    BNP No results found for: BNP  ProBNP No results found for: PROBNP  Imaging: No results found.   Assessment & Plan:   No problem-specific Assessment & Plan notes found for this encounter.     Rexene Edison, NP 05/12/2017

## 2017-05-12 NOTE — Assessment & Plan Note (Signed)
Wt loss  

## 2017-05-13 DIAGNOSIS — S42021D Displaced fracture of shaft of right clavicle, subsequent encounter for fracture with routine healing: Secondary | ICD-10-CM | POA: Diagnosis not present

## 2017-05-18 ENCOUNTER — Other Ambulatory Visit: Payer: Self-pay | Admitting: Internal Medicine

## 2017-05-18 DIAGNOSIS — E118 Type 2 diabetes mellitus with unspecified complications: Secondary | ICD-10-CM

## 2017-05-25 DIAGNOSIS — G4733 Obstructive sleep apnea (adult) (pediatric): Secondary | ICD-10-CM | POA: Diagnosis not present

## 2017-06-15 DIAGNOSIS — S42021D Displaced fracture of shaft of right clavicle, subsequent encounter for fracture with routine healing: Secondary | ICD-10-CM | POA: Diagnosis not present

## 2017-06-23 ENCOUNTER — Inpatient Hospital Stay (HOSPITAL_BASED_OUTPATIENT_CLINIC_OR_DEPARTMENT_OTHER)
Admission: EM | Admit: 2017-06-23 | Discharge: 2017-06-28 | DRG: 064 | Disposition: A | Discharge status: Rehabilitation Facility | Payer: Worker's Compensation | Attending: Neurology | Admitting: Neurology

## 2017-06-23 ENCOUNTER — Other Ambulatory Visit: Payer: Self-pay

## 2017-06-23 ENCOUNTER — Encounter (HOSPITAL_BASED_OUTPATIENT_CLINIC_OR_DEPARTMENT_OTHER): Payer: Self-pay | Admitting: Emergency Medicine

## 2017-06-23 ENCOUNTER — Emergency Department (HOSPITAL_BASED_OUTPATIENT_CLINIC_OR_DEPARTMENT_OTHER): Payer: Worker's Compensation

## 2017-06-23 DIAGNOSIS — I639 Cerebral infarction, unspecified: Secondary | ICD-10-CM | POA: Diagnosis not present

## 2017-06-23 DIAGNOSIS — E785 Hyperlipidemia, unspecified: Secondary | ICD-10-CM

## 2017-06-23 DIAGNOSIS — E1142 Type 2 diabetes mellitus with diabetic polyneuropathy: Secondary | ICD-10-CM

## 2017-06-23 DIAGNOSIS — Z7982 Long term (current) use of aspirin: Secondary | ICD-10-CM | POA: Diagnosis not present

## 2017-06-23 DIAGNOSIS — Z8673 Personal history of transient ischemic attack (TIA), and cerebral infarction without residual deficits: Secondary | ICD-10-CM | POA: Diagnosis not present

## 2017-06-23 DIAGNOSIS — Z8781 Personal history of (healed) traumatic fracture: Secondary | ICD-10-CM

## 2017-06-23 DIAGNOSIS — R51 Headache: Secondary | ICD-10-CM | POA: Diagnosis not present

## 2017-06-23 DIAGNOSIS — F32A Depression, unspecified: Secondary | ICD-10-CM

## 2017-06-23 DIAGNOSIS — S42031K Displaced fracture of lateral end of right clavicle, subsequent encounter for fracture with nonunion: Secondary | ICD-10-CM | POA: Diagnosis not present

## 2017-06-23 DIAGNOSIS — G8194 Hemiplegia, unspecified affecting left nondominant side: Secondary | ICD-10-CM | POA: Diagnosis present

## 2017-06-23 DIAGNOSIS — Z87891 Personal history of nicotine dependence: Secondary | ICD-10-CM | POA: Diagnosis not present

## 2017-06-23 DIAGNOSIS — Z823 Family history of stroke: Secondary | ICD-10-CM | POA: Diagnosis not present

## 2017-06-23 DIAGNOSIS — Z882 Allergy status to sulfonamides status: Secondary | ICD-10-CM | POA: Diagnosis not present

## 2017-06-23 DIAGNOSIS — M7989 Other specified soft tissue disorders: Secondary | ICD-10-CM | POA: Diagnosis not present

## 2017-06-23 DIAGNOSIS — H47012 Ischemic optic neuropathy, left eye: Secondary | ICD-10-CM | POA: Diagnosis not present

## 2017-06-23 DIAGNOSIS — N400 Enlarged prostate without lower urinary tract symptoms: Secondary | ICD-10-CM | POA: Diagnosis not present

## 2017-06-23 DIAGNOSIS — Z888 Allergy status to other drugs, medicaments and biological substances status: Secondary | ICD-10-CM | POA: Diagnosis not present

## 2017-06-23 DIAGNOSIS — S42031A Displaced fracture of lateral end of right clavicle, initial encounter for closed fracture: Secondary | ICD-10-CM | POA: Diagnosis not present

## 2017-06-23 DIAGNOSIS — Z79899 Other long term (current) drug therapy: Secondary | ICD-10-CM

## 2017-06-23 DIAGNOSIS — R296 Repeated falls: Secondary | ICD-10-CM | POA: Diagnosis not present

## 2017-06-23 DIAGNOSIS — D62 Acute posthemorrhagic anemia: Secondary | ICD-10-CM | POA: Diagnosis not present

## 2017-06-23 DIAGNOSIS — S42001A Fracture of unspecified part of right clavicle, initial encounter for closed fracture: Secondary | ICD-10-CM | POA: Diagnosis not present

## 2017-06-23 DIAGNOSIS — G936 Cerebral edema: Secondary | ICD-10-CM | POA: Diagnosis not present

## 2017-06-23 DIAGNOSIS — I6529 Occlusion and stenosis of unspecified carotid artery: Secondary | ICD-10-CM | POA: Diagnosis not present

## 2017-06-23 DIAGNOSIS — Z880 Allergy status to penicillin: Secondary | ICD-10-CM

## 2017-06-23 DIAGNOSIS — Z7984 Long term (current) use of oral hypoglycemic drugs: Secondary | ICD-10-CM

## 2017-06-23 DIAGNOSIS — I611 Nontraumatic intracerebral hemorrhage in hemisphere, cortical: Principal | ICD-10-CM | POA: Diagnosis present

## 2017-06-23 DIAGNOSIS — M75101 Unspecified rotator cuff tear or rupture of right shoulder, not specified as traumatic: Secondary | ICD-10-CM | POA: Diagnosis not present

## 2017-06-23 DIAGNOSIS — N182 Chronic kidney disease, stage 2 (mild): Secondary | ICD-10-CM | POA: Diagnosis not present

## 2017-06-23 DIAGNOSIS — W000XXA Fall on same level due to ice and snow, initial encounter: Secondary | ICD-10-CM | POA: Diagnosis present

## 2017-06-23 DIAGNOSIS — S32110A Nondisplaced Zone I fracture of sacrum, initial encounter for closed fracture: Secondary | ICD-10-CM | POA: Diagnosis not present

## 2017-06-23 DIAGNOSIS — N179 Acute kidney failure, unspecified: Secondary | ICD-10-CM | POA: Diagnosis not present

## 2017-06-23 DIAGNOSIS — R7989 Other specified abnormal findings of blood chemistry: Secondary | ICD-10-CM | POA: Diagnosis not present

## 2017-06-23 DIAGNOSIS — Z8249 Family history of ischemic heart disease and other diseases of the circulatory system: Secondary | ICD-10-CM

## 2017-06-23 DIAGNOSIS — S199XXA Unspecified injury of neck, initial encounter: Secondary | ICD-10-CM | POA: Diagnosis not present

## 2017-06-23 DIAGNOSIS — I951 Orthostatic hypotension: Secondary | ICD-10-CM | POA: Diagnosis not present

## 2017-06-23 DIAGNOSIS — S062XAA Diffuse traumatic brain injury with loss of consciousness status unknown, initial encounter: Secondary | ICD-10-CM | POA: Diagnosis present

## 2017-06-23 DIAGNOSIS — I10 Essential (primary) hypertension: Secondary | ICD-10-CM | POA: Diagnosis not present

## 2017-06-23 DIAGNOSIS — N183 Chronic kidney disease, stage 3 (moderate): Secondary | ICD-10-CM | POA: Diagnosis not present

## 2017-06-23 DIAGNOSIS — I5030 Unspecified diastolic (congestive) heart failure: Secondary | ICD-10-CM | POA: Diagnosis not present

## 2017-06-23 DIAGNOSIS — S0990XA Unspecified injury of head, initial encounter: Secondary | ICD-10-CM | POA: Diagnosis not present

## 2017-06-23 DIAGNOSIS — G4733 Obstructive sleep apnea (adult) (pediatric): Secondary | ICD-10-CM | POA: Diagnosis present

## 2017-06-23 DIAGNOSIS — M1811 Unilateral primary osteoarthritis of first carpometacarpal joint, right hand: Secondary | ICD-10-CM | POA: Diagnosis not present

## 2017-06-23 DIAGNOSIS — R269 Unspecified abnormalities of gait and mobility: Secondary | ICD-10-CM | POA: Diagnosis not present

## 2017-06-23 DIAGNOSIS — I69398 Other sequelae of cerebral infarction: Secondary | ICD-10-CM | POA: Diagnosis not present

## 2017-06-23 DIAGNOSIS — E871 Hypo-osmolality and hyponatremia: Secondary | ICD-10-CM | POA: Diagnosis not present

## 2017-06-23 DIAGNOSIS — S06301S Unspecified focal traumatic brain injury with loss of consciousness of 30 minutes or less, sequela: Secondary | ICD-10-CM | POA: Diagnosis not present

## 2017-06-23 DIAGNOSIS — E119 Type 2 diabetes mellitus without complications: Secondary | ICD-10-CM | POA: Diagnosis present

## 2017-06-23 DIAGNOSIS — F329 Major depressive disorder, single episode, unspecified: Secondary | ICD-10-CM | POA: Diagnosis not present

## 2017-06-23 DIAGNOSIS — S3992XA Unspecified injury of lower back, initial encounter: Secondary | ICD-10-CM | POA: Diagnosis not present

## 2017-06-23 DIAGNOSIS — D638 Anemia in other chronic diseases classified elsewhere: Secondary | ICD-10-CM | POA: Diagnosis not present

## 2017-06-23 DIAGNOSIS — K219 Gastro-esophageal reflux disease without esophagitis: Secondary | ICD-10-CM | POA: Diagnosis present

## 2017-06-23 DIAGNOSIS — S322XXA Fracture of coccyx, initial encounter for closed fracture: Secondary | ICD-10-CM | POA: Diagnosis present

## 2017-06-23 DIAGNOSIS — N189 Chronic kidney disease, unspecified: Secondary | ICD-10-CM | POA: Diagnosis not present

## 2017-06-23 DIAGNOSIS — Z9989 Dependence on other enabling machines and devices: Secondary | ICD-10-CM | POA: Diagnosis not present

## 2017-06-23 DIAGNOSIS — S062X9A Diffuse traumatic brain injury with loss of consciousness of unspecified duration, initial encounter: Secondary | ICD-10-CM | POA: Diagnosis present

## 2017-06-23 DIAGNOSIS — I610 Nontraumatic intracerebral hemorrhage in hemisphere, subcortical: Secondary | ICD-10-CM | POA: Diagnosis not present

## 2017-06-23 DIAGNOSIS — Z803 Family history of malignant neoplasm of breast: Secondary | ICD-10-CM | POA: Diagnosis not present

## 2017-06-23 DIAGNOSIS — E1159 Type 2 diabetes mellitus with other circulatory complications: Secondary | ICD-10-CM | POA: Diagnosis not present

## 2017-06-23 DIAGNOSIS — I159 Secondary hypertension, unspecified: Secondary | ICD-10-CM | POA: Diagnosis not present

## 2017-06-23 DIAGNOSIS — S062X0A Diffuse traumatic brain injury without loss of consciousness, initial encounter: Secondary | ICD-10-CM

## 2017-06-23 DIAGNOSIS — I619 Nontraumatic intracerebral hemorrhage, unspecified: Secondary | ICD-10-CM | POA: Diagnosis present

## 2017-06-23 DIAGNOSIS — S42024A Nondisplaced fracture of shaft of right clavicle, initial encounter for closed fracture: Secondary | ICD-10-CM | POA: Diagnosis not present

## 2017-06-23 DIAGNOSIS — S06349D Traumatic hemorrhage of right cerebrum with loss of consciousness of unspecified duration, subsequent encounter: Secondary | ICD-10-CM | POA: Diagnosis not present

## 2017-06-23 LAB — RAPID URINE DRUG SCREEN, HOSP PERFORMED
Amphetamines: NOT DETECTED
Barbiturates: NOT DETECTED
Benzodiazepines: NOT DETECTED
Cocaine: NOT DETECTED
Opiates: NOT DETECTED
Tetrahydrocannabinol: NOT DETECTED

## 2017-06-23 LAB — URINALYSIS, ROUTINE W REFLEX MICROSCOPIC
Bilirubin Urine: NEGATIVE
Glucose, UA: NEGATIVE mg/dL
Ketones, ur: 15 mg/dL — AB
Leukocytes, UA: NEGATIVE
Nitrite: NEGATIVE
Protein, ur: 30 mg/dL — AB
Specific Gravity, Urine: 1.025 (ref 1.005–1.030)
pH: 6 (ref 5.0–8.0)

## 2017-06-23 LAB — URINALYSIS, MICROSCOPIC (REFLEX)

## 2017-06-23 LAB — CBC
HCT: 33 % — ABNORMAL LOW (ref 39.0–52.0)
Hemoglobin: 10.5 g/dL — ABNORMAL LOW (ref 13.0–17.0)
MCH: 27.3 pg (ref 26.0–34.0)
MCHC: 31.8 g/dL (ref 30.0–36.0)
MCV: 85.9 fL (ref 78.0–100.0)
Platelets: 150 10*3/uL (ref 150–400)
RBC: 3.84 MIL/uL — ABNORMAL LOW (ref 4.22–5.81)
RDW: 13.1 % (ref 11.5–15.5)
WBC: 6.7 10*3/uL (ref 4.0–10.5)

## 2017-06-23 LAB — GLUCOSE, CAPILLARY
Glucose-Capillary: 185 mg/dL — ABNORMAL HIGH (ref 65–99)
Glucose-Capillary: 176 mg/dL — ABNORMAL HIGH (ref 65–99)

## 2017-06-23 LAB — BASIC METABOLIC PANEL
Anion gap: 8 (ref 5–15)
BUN: 36 mg/dL — ABNORMAL HIGH (ref 6–20)
CO2: 25 mmol/L (ref 22–32)
Calcium: 9.2 mg/dL (ref 8.9–10.3)
Chloride: 103 mmol/L (ref 101–111)
Creatinine, Ser: 1.22 mg/dL (ref 0.61–1.24)
GFR calc Af Amer: >60 mL/min (ref >60)
GFR calc non Af Amer: 55 mL/min — ABNORMAL LOW (ref >60)
Glucose, Bld: 216 mg/dL — ABNORMAL HIGH (ref 65–99)
Potassium: 5 mmol/L (ref 3.5–5.1)
Sodium: 136 mmol/L (ref 135–145)

## 2017-06-23 LAB — ETHANOL: Alcohol, Ethyl (B): <10 mg/dL (ref <10)

## 2017-06-23 LAB — PROTIME-INR
INR: 0.94
Prothrombin Time: 12.5 seconds (ref 11.4–15.2)

## 2017-06-23 LAB — APTT: aPTT: 24 seconds (ref 24–36)

## 2017-06-23 LAB — TROPONIN I: Troponin I: <0.03 ng/mL (ref <0.03)

## 2017-06-23 LAB — MRSA PCR SCREENING: MRSA by PCR: NEGATIVE

## 2017-06-23 MED ORDER — OXYBUTYNIN CHLORIDE 5 MG PO TABS
5.0000 mg | ORAL_TABLET | Freq: Two times a day (BID) | ORAL | Status: DC
  Administered 2017-06-23 – 2017-06-28 (×10): 5 mg via ORAL
  Filled 2017-06-23 (×10): qty 1

## 2017-06-23 MED ORDER — SENNOSIDES-DOCUSATE SODIUM 8.6-50 MG PO TABS
1.0000 | ORAL_TABLET | Freq: Two times a day (BID) | ORAL | Status: DC
  Administered 2017-06-23 – 2017-06-28 (×10): 1 via ORAL
  Filled 2017-06-23 (×10): qty 1

## 2017-06-23 MED ORDER — IRBESARTAN 150 MG PO TABS
150.0000 mg | ORAL_TABLET | Freq: Every day | ORAL | Status: DC
  Administered 2017-06-24 – 2017-06-27 (×4): 150 mg via ORAL
  Filled 2017-06-23 (×4): qty 1

## 2017-06-23 MED ORDER — PANTOPRAZOLE SODIUM 40 MG IV SOLR
40.0000 mg | Freq: Every day | INTRAVENOUS | Status: DC
  Administered 2017-06-23: 40 mg via INTRAVENOUS
  Filled 2017-06-23: qty 40

## 2017-06-23 MED ORDER — INSULIN ASPART 100 UNIT/ML ~~LOC~~ SOLN
0.0000 [IU] | Freq: Three times a day (TID) | SUBCUTANEOUS | Status: DC
  Administered 2017-06-23 – 2017-06-24 (×2): 3 [IU] via SUBCUTANEOUS
  Administered 2017-06-24: 5 [IU] via SUBCUTANEOUS
  Administered 2017-06-24: 2 [IU] via SUBCUTANEOUS
  Administered 2017-06-25 (×2): 5 [IU] via SUBCUTANEOUS
  Administered 2017-06-25 – 2017-06-26 (×2): 3 [IU] via SUBCUTANEOUS
  Administered 2017-06-26: 5 [IU] via SUBCUTANEOUS
  Administered 2017-06-26 – 2017-06-27 (×3): 3 [IU] via SUBCUTANEOUS
  Administered 2017-06-27: 5 [IU] via SUBCUTANEOUS
  Administered 2017-06-28 (×2): 3 [IU] via SUBCUTANEOUS

## 2017-06-23 MED ORDER — ACETAMINOPHEN 160 MG/5ML PO SOLN: 650.0000 mg | ORAL | Status: DC | PRN

## 2017-06-23 MED ORDER — STROKE: EARLY STAGES OF RECOVERY BOOK
Freq: Once | Status: AC
  Administered 2017-06-23: 1
  Filled 2017-06-23: qty 1

## 2017-06-23 MED ORDER — NICARDIPINE HCL IN NACL 40-0.83 MG/200ML-% IV SOLN
3.0000 mg/h | INTRAVENOUS | Status: DC
  Administered 2017-06-23: 30 mg/h via INTRAVENOUS
  Administered 2017-06-23: 15 mg/h via INTRAVENOUS
  Administered 2017-06-24: 11 mg/h via INTRAVENOUS
  Administered 2017-06-24: 8.5 mg/h via INTRAVENOUS
  Administered 2017-06-24: 3 mg/h via INTRAVENOUS
  Filled 2017-06-23 (×4): qty 200

## 2017-06-23 MED ORDER — ATORVASTATIN CALCIUM 10 MG PO TABS
10.0000 mg | ORAL_TABLET | Freq: Every day | ORAL | Status: DC
  Administered 2017-06-24 – 2017-06-28 (×5): 10 mg via ORAL
  Filled 2017-06-23 (×5): qty 1

## 2017-06-23 MED ORDER — FLUOXETINE HCL 20 MG PO CAPS
20.0000 mg | ORAL_CAPSULE | Freq: Every day | ORAL | Status: DC
  Administered 2017-06-24 – 2017-06-28 (×5): 20 mg via ORAL
  Filled 2017-06-23 (×4): qty 1

## 2017-06-23 MED ORDER — DOXAZOSIN MESYLATE 2 MG PO TABS
2.0000 mg | ORAL_TABLET | Freq: Every day | ORAL | Status: DC
  Administered 2017-06-23 – 2017-06-27 (×5): 2 mg via ORAL
  Filled 2017-06-23 (×5): qty 1

## 2017-06-23 MED ORDER — ACETAMINOPHEN 325 MG PO TABS
650.0000 mg | ORAL_TABLET | ORAL | Status: DC | PRN
  Administered 2017-06-27 – 2017-06-28 (×2): 650 mg via ORAL
  Filled 2017-06-23 (×2): qty 2

## 2017-06-23 MED ORDER — NICARDIPINE HCL IN NACL 20-0.86 MG/200ML-% IV SOLN
0.0000 mg/h | INTRAVENOUS | Status: AC
  Administered 2017-06-23: 2 mg/h via INTRAVENOUS
  Administered 2017-06-23: 15 mg/h via INTRAVENOUS
  Filled 2017-06-23 (×3): qty 200

## 2017-06-23 MED ORDER — ACETAMINOPHEN 650 MG RE SUPP: 650.0000 mg | RECTAL | Status: DC | PRN

## 2017-06-23 NOTE — H&P (Signed)
Neurology H&P  CC: Fall  History is obtained from: Patient  HPI: Frank [name] is a 76 y.o. male with a history of diabetes, hypertension, hyperlipidemia who presents after a fall.  He states that he felt like he fell because he slipped on ice.  He did not identify any symptoms prior to that, and states that he does not feel many symptoms afterwards other than some mild dizziness.  As part of his workup he had a CT scan which identified a small intraparenchymal hemorrhage in the right posterior parietal region.  It was unclear as to whether this was there prior to the fall, possibly contributing versus resulting from the fall.  It is an unusual location for a posttraumatic hemorrhage and therefore we decided to treat as potentially nontraumatic hemorrhage.   LKW: Unclear tpa given?: no, ICH ICH Score: 0  ROS: A 14 point ROS was performed and is negative except as noted in the HPI.   Past Medical History:  Diagnosis Date  . Allergy    Rhinitis  . Anemia    NOS iron deficient and B12 deficient  . Arthritis   . Diabetes mellitus    Type 2  . GERD (gastroesophageal reflux disease)   . Hyperlipidemia   . Hypertension   . 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 OSA (obstructive sleep apnea)    cpap     Family History  Problem Relation Age of Onset  . Heart disease Mother   . Breast cancer Mother   . Stroke Father   . Allergies Father        Father and children  . Coronary artery disease Brother   . Diabetes Neg Hx   . Colon cancer Neg Hx      Social History:  reports that he quit smoking about 33 years ago. He smoked 1.00 pack per day. he has never used smokeless tobacco. He reports that he drinks about 1.0 oz of alcohol per week. He reports that he does not use drugs.   Exam: Current vital signs: BP (!) 139/104   Pulse (!) 107   Temp 97.6 F (36.4 C) (Oral)   Resp 12   Ht 5\' 10"  (1.778 m)   Wt 94.1 kg (207 lb 7.3 oz)   SpO2 91%   BMI 29.77 kg/m   Vital signs in last 24 hours: Temp:  [97.6 F (36.4 C)-98.3 F (36.8 C)] 97.6 F (36.4 C) (12/12 1544) Pulse Rate:  [72-107] 107 (12/12 1700) Resp:  [12-23] 12 (12/12 1700) BP: (126-185)/(53-104) 139/104 (12/12 1700) SpO2:  [89 %-96 %] 91 % (12/12 1700) Weight:  [94.1 kg (207 lb 7.3 oz)-96.6 kg (213 lb)] 94.1 kg (207 lb 7.3 oz) (12/12 1544)  Physical Exam  Constitutional: Appears well-developed and well-nourished.  Psych: Affect appropriate to situation Eyes: No scleral injection HENT: No OP obstrucion Head: Normocephalic.  Cardiovascular: Normal rate and regular rhythm.  Respiratory: Effort normal  GI: Soft.  No distension. There is no tenderness.  Skin: WDI  Neuro: Mental Status: Patient is awake, alert, oriented to person, place, month, year, and situation. Patient is able to give a clear and coherent history. No signs of aphasia or neglect Cranial Nerves: II: Visual Fields are full. Pupils are equal, round, and reactive to light.   III,IV, VI: EOMI without ptosis or diploplia.  V: Facial sensation is symmetric to temperature VII: Facial movement is symmetric.  VIII: hearing is intact to voice X: Uvula elevates symmetrically XI: Shoulder  shrug is symmetric. XII: tongue is midline without atrophy or fasciculations.  Motor: Tone is normal. Bulk is normal. 5/5 strength was present in all four extremities.  Sensory: Sensation is symmetric to light touch and temperature in the arms and legs. Cerebellar: FNF  intact bilaterally   I have reviewed labs in epic and the results pertinent to this consultation are: Glucose of 216, normal sodium, normal potassium UA with signs of infection Negative UDS CBC-mild anemia  I have reviewed the images obtained: CT head- right parietal intraparenchymal hematoma that is near the surface.  Impression: 76 year old male with intra-parenchymal hematoma.  Given how mild his symptoms are at the current time, I do not think that he is a  surgical candidate.  It is unclear if this truly was posttraumatic, therefore I am treating as potentially spontaneous hemorrhage at this time.  Amyloid angiopathy would be high in the differential.  He is currently on IV nicardipine.  Recommendations: 1) Admit to ICU 2) no antiplatelets or anticoagulants 3) blood pressure control with goal systolic [phone] 4) Frequent neuro checks 5) If symptoms worsen or there is decreased mental status, repeat stat head CT 6) PT,OT,ST   This patient is critically ill and at significant risk of neurological worsening, death and care requires constant monitoring of vital signs, hemodynamics,respiratory and cardiac monitoring, neurological assessment, discussion with family, other specialists and medical decision making of high complexity. I spent 50 minutes of neurocritical care time  in the care of  this patient.  Roland Rack, MD [facility] [phone]  If 7pm- 7am, please page neurology on call as listed in [place]. 06/23/2017  5:25 PM

## 2017-06-23 NOTE — ED Provider Notes (Signed)
[facility] EMERGENCY DEPARTMENT Provider Note   CSN: [id number] Arrival date & time: 06/23/17  1000     History   Chief Complaint Chief Complaint  Patient presents with  . Fall    HPI Frank [name] is a 76 y.o. male.  HPI Patient presents after a fall.  Slipped at work and landed backwards hitting his head.  Complains of a headache and is somewhat unsteady.  Dizzy and pale upon arrival to the ER.  No confusion.  No vision changes.  States he had some mild pain in his "fanny".  No numbness or weakness.  He is on baby aspirin but no other anticoagulation.  No chest pain.  No abdominal pain. Past Medical History:  Diagnosis Date  . Allergy    Rhinitis  . Anemia    NOS iron deficient and B12 deficient  . Arthritis   . Diabetes mellitus    Type 2  . GERD (gastroesophageal reflux disease)   . Hyperlipidemia   . Hypertension   . 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 OSA (obstructive sleep apnea)    cpap    Patient Active Problem List   Diagnosis Date Noted  . Intracranial hematoma following injury ([place]) 06/23/2017  . Morbid obesity ([place]) 05/12/2017  . OAB (overactive bladder) 11/26/2014  . Routine general medical examination at a health care facility 04/06/2014  . Vitamin D deficiency 07/23/2010  . Obstructive sleep apnea 09/13/2008  . Hyperlipidemia with target LDL less than 100 07/27/2007  . BPH (benign prostatic hyperplasia) 06/21/2007  . 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 Type II diabetes mellitus with manifestations ([place]) 04/15/2006  . B12 deficiency anemia 04/15/2006  . Essential hypertension 04/15/2006  . ALLERGIC RHINITIS 04/15/2006    Past Surgical History:  Procedure Laterality Date  . APPENDECTOMY    . arm fracture Left 2011   with hardware  . COLONOSCOPY    . ESOPHAGOGASTRODUODENOSCOPY  2006   gastritis  . ROTATOR CUFF REPAIR    . SEPTOPLASTY  1959   Deviated Septum  . 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 Authorizing Provider  aspirin 81 MG tablet Take 81 mg by mouth daily.      [provider]  atorvastatin (LIPITOR) 20 MG tablet Take 0.5 tablets (10 mg total) by mouth daily. 07/17/16   Janith Lima, MD  cetirizine (ZYRTEC) 10 MG tablet Take 10 mg by mouth daily.    [provider]  doxazosin (CARDURA) 4 MG tablet Take 0.5 tablets (2 mg total) by mouth at bedtime. 07/17/16   Janith Lima, MD  FLUoxetine (PROZAC) 20 MG capsule TAKE 1 CAPSULE BY MOUTH  DAILY 03/01/17   Janith Lima, MD  glipiZIDE (GLUCOTROL XL) 10 MG 24 hr tablet TAKE 1 TABLET BY MOUTH  DAILY 03/01/17   Janith Lima, MD  JANUVIA 100 MG tablet  05/01/17   [provider]  metFORMIN (GLUCOPHAGE) 1000 MG tablet TAKE 1 TABLET BY MOUTH TWO  TIMES DAILY 05/18/17   Janith Lima, MD  oxybutynin (DITROPAN) 5 MG tablet TAKE 1 TABLET BY MOUTH TWO  TIMES DAILY 03/01/17   Janith Lima, MD  telmisartan (MICARDIS) 40 MG tablet TAKE 1 TABLET BY MOUTH  DAILY 03/01/17   Janith Lima, MD    Family History Family History  Problem Relation  Age of Onset  . Heart disease Mother   . Breast cancer Mother   . Stroke Father   . Allergies Father        Father and children  . Coronary artery disease Brother   . Diabetes Neg Hx   . Colon cancer Neg Hx     Social History Social History   Tobacco Use  . Smoking status: Former Smoker    Packs/day: 1.00    Last attempt to quit: 07/14/1983    Years since quitting: 33.9  . Smokeless tobacco: Never Used  Substance Use Topics  . Alcohol use: Yes    Alcohol/week: 1.0 oz    Types: 2 Standard drinks or equivalent per week    Comment: occassionally  . Drug use: No     Allergies   Lisinopril; Penicillins; and Sulfamethoxazole   Review of Systems Review of Systems  Constitutional: Negative for activity change.  HENT: Negative for congestion.    Respiratory: Negative for chest tightness.   Cardiovascular: Negative for chest pain.  Gastrointestinal: Negative for abdominal pain.  Genitourinary: Negative for flank pain.  Musculoskeletal: Negative for back pain.  Neurological: Positive for dizziness.  Hematological: Negative for adenopathy.  Psychiatric/Behavioral: Negative for confusion.     Physical Exam Updated Vital Signs BP (!) 152/73 (BP Location: Left Arm)   Pulse 85   Temp 98.3 F (36.8 C) (Oral)   Resp 18   Ht 5\' 10"  (1.778 m)   Wt 96.6 kg (213 lb)   SpO2 95%   BMI 30.56 kg/m   Physical Exam  Constitutional: He is oriented to person, place, and time. He appears well-developed.  HENT:  Head: Normocephalic.  Mild abrasion occipital area.  Eyes: Pupils are equal, round, and reactive to light.  Neck:  Mild upper cervical spine tenderness.  Cardiovascular: Normal rate.  Pulmonary/Chest: Effort normal.  Abdominal: There is no tenderness.  Musculoskeletal: He exhibits no edema or tenderness.  Neurological: He is alert and oriented to person, place, and time.  Patient gets dizzy with sitting up.  Finger-nose may be slightly unsteady on the left side.  Good grip strength bilaterally.  Able to straight leg raise bilaterally.  Awake and appropriate  Skin: Skin is warm. Capillary refill takes less than 2 seconds.  Psychiatric: He has a normal mood and affect.     ED Treatments / Results  Labs (all labs ordered are listed, but only abnormal results are displayed) Labs Reviewed  BASIC METABOLIC PANEL - Abnormal; Notable for the following components:      Result Value   Glucose, Bld 216 (*)    BUN 36 (*)    GFR calc non Af Amer 55 (*)    All other components within normal limits  CBC - Abnormal; Notable for the following components:   RBC 3.84 (*)    Hemoglobin 10.5 (*)    HCT 33.0 (*)    All other components within normal limits  URINALYSIS, ROUTINE W REFLEX MICROSCOPIC - Abnormal; Notable for the following  components:   Hgb urine dipstick TRACE (*)    Ketones, ur 15 (*)    Protein, ur 30 (*)    All other components within normal limits  URINALYSIS, MICROSCOPIC (REFLEX) - Abnormal; Notable for the following components:   Bacteria, UA RARE (*)    Squamous Epithelial / LPF 0-5 (*)    All other components within normal limits  ETHANOL  PROTIME-INR  APTT  TROPONIN I  RAPID URINE DRUG SCREEN, HOSP  PERFORMED    EKG  EKG Interpretation  Date/Time:  Wednesday June 23 2017 10:16:34 EST Ventricular Rate:  71 PR Interval:    QRS Duration: 101 QT Interval:  425 QTC Calculation: 462 R Axis:   55 Text Interpretation:  Sinus rhythm Minimal ST elevation, anterior leads Confirmed by Davonna Belling [phone]) on 06/23/2017 11:01:08 AM       Radiology Ct Head Wo Contrast  Addendum Date: 06/23/2017   ADDENDUM REPORT: 06/23/2017 12:09 ADDENDUM: Dr. Leonel Ramsay called to discuss this case. Given that the patient fell this morning, the surrounding edema is more than one might expect and it is possible that this could be a nontraumatic intraparenchymal hemorrhage either due to stroke, mass or amyloid angiopathy. The differential diagnosis remains that of post traumatic intraparenchymal hemorrhage. Scalp soft tissue swelling is seen in the posterior midline. Electronically Signed   By: Nelson Chimes M.D.   On: 06/23/2017 12:09   Result Date: 06/23/2017 CLINICAL DATA:  Fall, hit back of head. EXAM: CT HEAD WITHOUT CONTRAST CT CERVICAL SPINE WITHOUT CONTRAST TECHNIQUE: Multidetector CT imaging of the head and cervical spine was performed following the standard protocol without intravenous contrast. Multiplanar CT image reconstructions of the cervical spine were also generated. COMPARISON:  03/14/2017 FINDINGS: CT HEAD FINDINGS Brain: There is an area of hemorrhage within the right posterior parietal lobe measuring 3.2 x 1.8 cm. Small amount of surrounding edema. No significant mass effect or midline  shift. No hydrocephalus. Vascular: No hyperdense vessel or unexpected calcification. Skull: No acute calvarial abnormality. Sinuses/Orbits: Mucosal thickening within the paranasal sinuses. No acute finding. Other: None CT CERVICAL SPINE FINDINGS Alignment: Normal Skull base and vertebrae: No fractured Soft tissues and spinal canal: Prevertebral soft tissues are normal. No epidural or paraspinal hematoma. Disc levels:  Diffuse degenerative disc disease and facet disease. Upper chest: No acute findings. Other: Dense carotid artery/ bulb calcifications bilaterally. IMPRESSION: Area of hemorrhage in the posterior right parietal lobe measuring 3.2 x 1.8 cm. No significant mass effect or midline shift. Chronic sinusitis changes. Cervical spondylosis.  No acute findings in the cervical spine. Critical Value/emergent results were called by telephone at the time of interpretation on 06/23/2017 at 10:58 am to Dr. Davonna Belling , who verbally acknowledged these results. Electronically Signed: By: Rolm Baptise M.D. On: 06/23/2017 10:58   Ct Cervical Spine Wo Contrast  Addendum Date: 06/23/2017   ADDENDUM REPORT: 06/23/2017 12:09 ADDENDUM: Dr. Leonel Ramsay called to discuss this case. Given that the patient fell this morning, the surrounding edema is more than one might expect and it is possible that this could be a nontraumatic intraparenchymal hemorrhage either due to stroke, mass or amyloid angiopathy. The differential diagnosis remains that of post traumatic intraparenchymal hemorrhage. Scalp soft tissue swelling is seen in the posterior midline. Electronically Signed   By: Nelson Chimes M.D.   On: 06/23/2017 12:09   Result Date: 06/23/2017 CLINICAL DATA:  Fall, hit back of head. EXAM: CT HEAD WITHOUT CONTRAST CT CERVICAL SPINE WITHOUT CONTRAST TECHNIQUE: Multidetector CT imaging of the head and cervical spine was performed following the standard protocol without intravenous contrast. Multiplanar CT image  reconstructions of the cervical spine were also generated. COMPARISON:  03/14/2017 FINDINGS: CT HEAD FINDINGS Brain: There is an area of hemorrhage within the right posterior parietal lobe measuring 3.2 x 1.8 cm. Small amount of surrounding edema. No significant mass effect or midline shift. No hydrocephalus. Vascular: No hyperdense vessel or unexpected calcification. Skull: No acute calvarial abnormality. Sinuses/Orbits: Mucosal thickening  within the paranasal sinuses. No acute finding. Other: None CT CERVICAL SPINE FINDINGS Alignment: Normal Skull base and vertebrae: No fractured Soft tissues and spinal canal: Prevertebral soft tissues are normal. No epidural or paraspinal hematoma. Disc levels:  Diffuse degenerative disc disease and facet disease. Upper chest: No acute findings. Other: Dense carotid artery/ bulb calcifications bilaterally. IMPRESSION: Area of hemorrhage in the posterior right parietal lobe measuring 3.2 x 1.8 cm. No significant mass effect or midline shift. Chronic sinusitis changes. Cervical spondylosis.  No acute findings in the cervical spine. Critical Value/emergent results were called by telephone at the time of interpretation on 06/23/2017 at 10:58 am to Dr. Davonna Belling , who verbally acknowledged these results. Electronically Signed: By: Rolm Baptise M.D. On: 06/23/2017 10:58    Procedures Procedures (including critical care time)  Medications Ordered in ED Medications  nicardipine (CARDENE) 20mg  in 0.86% saline 223ml IV infusion (0.1 mg/ml) (0 mg/hr Intravenous Stopped 06/23/17 1406)     Initial Impression / Assessment and Plan / ED Course  I have reviewed the triage vital signs and the nursing notes.  Pertinent labs & imaging results that were available during my care of the patient were reviewed by me and considered in my medical decision making (see chart for details).     Patient with fall.  Reportedly slipped on the ice and fell striking his head.  He is  somewhat dizzy and finger-nose may be off slightly on the left side.  With sitting states he gets more dizzy.  CT scan done and shows intracranial hemorrhage.  However it is more likely of the imaging stroke versus traumatic.  Discussed with Dr. Arnoldo Morale from neurosurgery.  Requests admission to neurology.  Discussed with Dr. Leonel Ramsay.  Request tighter blood pressure control.  Discussed with patient and his family.  Will need further workup in the hospital.  Not a TPA candidate due to the bleed.  Question of stroke versus traumatic bleed.  CRITICAL CARE Performed by: Davonna Belling Total critical care time: 35 minutes Critical care time was exclusive of separately billable procedures and treating other patients. Critical care was necessary to treat or prevent imminent or life-threatening deterioration. Critical care was time spent personally by me on the following activities: development of treatment plan with patient and/or surrogate as well as nursing, discussions with consultants, evaluation of patient's response to treatment, examination of patient, obtaining history from patient or surrogate, ordering and performing treatments and interventions, ordering and review of laboratory studies, ordering and review of radiographic studies, pulse oximetry and re-evaluation of patient's condition.   Final Clinical Impressions(s) / ED Diagnoses   Final diagnoses:  Intracranial hematoma following injury, without loss of consciousness, initial encounter [facility])    ED Discharge Orders    None       Davonna Belling, MD 06/23/17 1436

## 2017-06-23 NOTE — ED Notes (Signed)
Family at bedside. 

## 2017-06-23 NOTE — ED Notes (Signed)
Patient is pale and dizzy at this time. Reports nausea

## 2017-06-23 NOTE — ED Triage Notes (Signed)
Patient was at work when he slipped and fell.  The patient is on ASA  - patient reports pain to his head and his "fanny"

## 2017-06-23 NOTE — ED Notes (Signed)
Patient transported to CT 

## 2017-06-24 ENCOUNTER — Inpatient Hospital Stay (HOSPITAL_COMMUNITY): Payer: Worker's Compensation

## 2017-06-24 DIAGNOSIS — I611 Nontraumatic intracerebral hemorrhage in hemisphere, cortical: Principal | ICD-10-CM

## 2017-06-24 DIAGNOSIS — R269 Unspecified abnormalities of gait and mobility: Secondary | ICD-10-CM

## 2017-06-24 DIAGNOSIS — I5030 Unspecified diastolic (congestive) heart failure: Secondary | ICD-10-CM

## 2017-06-24 DIAGNOSIS — I69398 Other sequelae of cerebral infarction: Secondary | ICD-10-CM

## 2017-06-24 LAB — ECHOCARDIOGRAM COMPLETE
Area-P 1/2: 2 cm2
E decel time: 333 msec
E/e' ratio: 5.27
FS: 38 % (ref 28–44)
Height: 70 in
IVS/LV PW RATIO, ED: 1.04
LA ID, A-P, ES: 41 mm
LA diam end sys: 41 mm
LA diam index: 1.93 cm/m2
LA vol A4C: 60.9 ml
LA vol index: 36.9 mL/m2
LA vol: 78.3 mL
LV E/e' medial: 5.27
LV E/e'average: 5.27
LV PW d: 11.2 mm — AB (ref 0.6–1.1)
LV dias vol index: 50 mL/m2
LV dias vol: 105 mL (ref 62–150)
LV e' LATERAL: 9.65 cm/s
LV sys vol index: 22 mL/m2
LV sys vol: 46 mL (ref 21–61)
LVOT SV: 81 mL
LVOT VTI: 21.3 cm
LVOT area: 3.8 cm2
LVOT diameter: 22 mm
LVOT peak vel: 108 cm/s
Lateral S' vel: 12.4 cm/s
MV Dec: 333
MV pk A vel: 102 m/s
MV pk E vel: 50.9 m/s
P 1/2 time: 110 ms
Simpson's disk: 56
Stroke v: 59 ml
TAPSE: 17.8 mm
TDI e' lateral: 9.65
TDI e' medial: 6.7
Weight: 3319.25 oz

## 2017-06-24 LAB — GLUCOSE, CAPILLARY
Glucose-Capillary: 189 mg/dL — ABNORMAL HIGH (ref 65–99)
Glucose-Capillary: 146 mg/dL — ABNORMAL HIGH (ref 65–99)
Glucose-Capillary: 216 mg/dL — ABNORMAL HIGH (ref 65–99)
Glucose-Capillary: 149 mg/dL — ABNORMAL HIGH (ref 65–99)

## 2017-06-24 MED ORDER — GADOBENATE DIMEGLUMINE 529 MG/ML IV SOLN
20.0000 mL | Freq: Once | INTRAVENOUS | Status: AC
  Administered 2017-06-24: 20 mL via INTRAVENOUS

## 2017-06-24 MED ORDER — LABETALOL HCL 5 MG/ML IV SOLN
10.0000 mg | INTRAVENOUS | Status: AC | PRN
  Administered 2017-06-24 (×2): 10 mg via INTRAVENOUS
  Filled 2017-06-24 (×2): qty 4

## 2017-06-24 MED ORDER — PANTOPRAZOLE SODIUM 40 MG PO TBEC
40.0000 mg | DELAYED_RELEASE_TABLET | Freq: Every day | ORAL | Status: DC
  Administered 2017-06-24 – 2017-06-27 (×4): 40 mg via ORAL
  Filled 2017-06-24 (×4): qty 1

## 2017-06-24 NOTE — Progress Notes (Signed)
OT Evaluation  PTA, pt lived with wife and was independent with mobility and ADL. Apparently pt with increased falls within the last several months. Pt currently requires mod A +2 for sit - stand and Max A +2 for ambulation and Max A with LB ADL due to significant deficits as listed below. Feel pt would benefit from intensive rehab at CIR to facilitate safe DC home with assistance after DC. Will follow acutely to address established goals and facilitate DC to next venue of care.    06/24/17 1500  OT Visit Information  Last OT Received On 06/24/17  Assistance Needed +2  PT/OT/SLP Co-Evaluation/Treatment Yes  Reason for Co-Treatment Complexity of the patient's impairments (multi-system involvement);To address functional/ADL transfers;For patient/therapist safety  OT goals addressed during session ADL's and self-care  History of Present Illness 76 y.o. male with a history of diabetes, hypertension, hyperlipidemia who presents after a fall with intraparenchymal hemorrhage in the right posterior parietal region.   Precautions  Precautions Fall  Precaution Comments left bias  Restrictions  Weight Bearing Restrictions No  Home Living  Family/patient expects to be discharged to: Private residence  Living Arrangements Spouse/significant other  Available Help at Discharge Family;Available 24 hours/day  Type of Home House  Home Access Stairs to enter  Entrance Stairs-Number of Steps 4  Entrance Stairs-Rails Left;Right;Can reach both  Home Layout One level  Engineer, manufacturing systems Yes  How Accessible Accessible via walker  Home Equipment None  Prior Function  Level of Independence Independent  Comments drove rental cars as job; 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  Communication  Communication No difficulties  Pain Assessment  Pain Assessment No/denies pain  Cognition  Arousal/Alertness Awake/alert  Behavior During Therapy  WFL for tasks assessed/performed  Overall Cognitive Status Impaired/Different from baseline  Area of Impairment Safety/judgement;Problem solving;Awareness;Attention  Current Attention Level Selective  Safety/Judgement Decreased awareness of safety;Decreased awareness of deficits  Awareness Emergent  Problem Solving Requires verbal cues  General Comments Pt unaware of falling toward L  Upper Extremity Assessment  Upper Extremity Assessment LUE deficits/detail  LUE Deficits / Details sensorimotor deficits. Appears to have impaired propriception  LUE Sensation decreased proprioception  LUE Coordination decreased gross motor  Lower Extremity Assessment  Lower Extremity Assessment LLE deficits/detail  LLE Sensation decreased proprioception  Cervical / Trunk Assessment  Cervical / Trunk Assessment Other exceptions (forward head and rounded shoulders)  Cervical / Trunk Exceptions forward head; L bias  ADL  Overall ADL's  Needs assistance/impaired  Eating/Feeding Set up;Supervision/ safety  Grooming Minimal assistance;Sitting  Upper Body Bathing Minimal assistance;Bed level  Upper Body Bathing Details (indicate cue type and reason) unable to sit unsupported to bath  Lower Body Bathing Moderate assistance;Sit to/from stand  Upper Body Dressing  Moderate assistance;Sitting  Lower Body Dressing Maximal assistance;Sit to/from Retail buyer +2 for physical assistance;Maximal assistance  Toileting- Clothing Manipulation and Hygiene Moderate assistance  Functional mobility during ADLs +2 for physical assistance;Maximal assistance;Cueing for sequencing;Cueing for safety  General ADL Comments unable to sit unsupported EOB  Vision- Assessment  Additional Comments appears WFL. Will further assess  Perception  Perception Tested? Yes  Perception Deficits Spatial orientation  Spatial deficits poor midline orientation; L bias  Praxis  Praxis tested? WFL  Bed Mobility  Overal bed mobility  Needs Assistance  Bed Mobility Rolling;Sidelying to Sit  Rolling Min assist  Sidelying to sit Mod assist  General bed mobility comments cues for sequence  with  assist to rotate trunk , bring legs off of bed and elevate trunk   Transfers  Overall transfer level Needs assistance  Transfers Sit to/from Stand  Sit to Stand Mod assist;+2 physical assistance;+2 safety/equipment  General transfer comment mod assist for safety with rise as pt with significant left bias and requires frequent cues for midline and posture as well as awareness of foot position; Max A for ambulation  Balance  Overall balance assessment Needs assistance  Sitting balance-Leahy Scale Poor  Sitting balance - Comments cues for position in space with use of RUE to hold rail to correct with cues  Postural control Left lateral lean  Standing balance-Leahy Scale Zero  Standing balance comment 2 person assist in standing to correct to midline due to left lean  OT - End of Session  Equipment Utilized During Treatment Gait belt;Rolling walker  Activity Tolerance Patient tolerated treatment well  Patient left in chair;with call bell/phone within reach;with chair alarm set  Nurse Communication Mobility status;Need for lift equipment (Use [place])  OT Assessment  OT Recommendation/Assessment Patient needs continued OT Services  OT Visit Diagnosis Other abnormalities of gait and mobility (R26.89);Repeated falls (R29.6);Muscle weakness (generalized) (M62.81);Other symptoms and signs involving cognitive function  OT Problem List Decreased strength;Decreased activity tolerance;Impaired balance (sitting and/or standing);Impaired vision/perception;Decreased coordination;Decreased cognition;Decreased safety awareness;Decreased knowledge of use of DME or AE;Impaired sensation;Impaired UE functional use  OT Plan  OT Frequency (ACUTE ONLY) Min 2X/week  OT Treatment/Interventions (ACUTE ONLY) Self-care/ADL training;Neuromuscular education;DME  and/or AE instruction;Therapeutic activities;Cognitive remediation/compensation;Visual/perceptual remediation/compensation;Patient/family education;Balance training  AM-PAC OT "6 Clicks" Daily Activity Outcome Measure  Help from another person eating meals? 3  Help from another person taking care of personal grooming? 3  Help from another person toileting, which includes using toliet, bedpan, or urinal? 2  Help from another person bathing (including washing, rinsing, drying)? 2  Help from another person to put on and taking off regular upper body clothing? 2  Help from another person to put on and taking off regular lower body clothing? 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

## 2017-06-24 NOTE — Consult Note (Signed)
2D Echo attempted but patient in the chair and neuro consult in the room. Will try echo later when patient is in the bed.

## 2017-06-24 NOTE — Consult Note (Signed)
Physical Medicine and Rehabilitation Consult Reason for Consult: Decreased functional mobility with multiple falls Referring Physician: Dr. Leonie Man   HPI: Frank [name] is a 76 y.o. right handed male with history of diabetes mellitus, hyperlipidemia, hypertension. Presented 06/23/2017 with multiple falls over this past year and latest fall stating he slipped on the ice after becoming dizzy. Patient lives with spouse reported to be independent prior to admission. One level home with 4 steps to entry. Cranial CT scan showed an area of hemorrhage in the posterior right parietal lobe measuring 3.2 x 1.8 cm. No significant mass effect or midline shift. Cervical spine films negative. Follow-up MRI identifies a 3.7 cm right parietal hemorrhage with unchanged mild surrounding edema and no mass effect. 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 Echocardiogram is pending. Tolerating a regular diet. Physical therapy evaluation completed 06/24/2017 with recommendations of physical medicine rehabilitation consult.  Still has headache.  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  HENT: Negative for hearing loss.   Eyes: Negative for blurred vision and double vision.  Respiratory: Negative for cough and shortness of breath.   Cardiovascular: Positive for leg swelling. Negative for chest pain and palpitations.  Gastrointestinal: Positive for constipation. Negative for nausea and vomiting.       GERD  Genitourinary: Positive for urgency. Negative for dysuria, flank pain and hematuria.  Musculoskeletal: Positive for falls.  Skin: Negative for rash.  Neurological: Positive for dizziness.  Psychiatric/Behavioral: Positive for depression.  All other systems reviewed and are negative.  Past Medical History:  Diagnosis Date  .  Allergy    Rhinitis  . Anemia    NOS iron deficient and B12 deficient  . Arthritis   . Diabetes mellitus    Type 2  . GERD (gastroesophageal reflux disease)   . Hyperlipidemia   . Hypertension   . 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 OSA (obstructive sleep apnea)    cpap   Past Surgical History:  Procedure Laterality Date  . APPENDECTOMY    . arm fracture Left 2011   with hardware  . COLONOSCOPY    . ESOPHAGOGASTRODUODENOSCOPY  2006   gastritis  . ROTATOR CUFF REPAIR    . SEPTOPLASTY  1959   Deviated Septum  . 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 Heart disease Mother   . Breast cancer Mother   . Stroke Father   . Allergies Father        Father and children  . Coronary artery disease Brother   . Diabetes Neg Hx   . Colon cancer Neg Hx    Social History:  reports that he quit smoking about 33 years ago. He smoked 1.00 pack per day. he has never used smokeless tobacco. He reports that he drinks about 1.0 oz of alcohol per week. He reports that he does not use drugs. Allergies:  Allergies  Allergen Reactions  . Lisinopril Cough  . Penicillins     REACTION: unspecified  . Sulfamethoxazole     REACTION: as child   Medications Prior to Admission  Medication Sig Dispense Refill  . aspirin 81 MG tablet Take 81 mg by mouth daily.      [name] atorvastatin (LIPITOR) 20 MG tablet Take 0.5 tablets (10  mg total) by mouth daily. 90 tablet 3  . cetirizine (ZYRTEC) 10 MG tablet Take 10 mg by mouth daily.    [name] doxazosin (CARDURA) 4 MG tablet Take 0.5 tablets (2 mg total) by mouth at bedtime. 90 tablet 3  . FLUoxetine (PROZAC) 20 MG capsule TAKE 1 CAPSULE BY MOUTH  DAILY (Patient taking differently: TAKE 1 CAPSULE (20mg ) BY MOUTH  DAILY) 90 capsule 1  . glipiZIDE (GLUCOTROL XL) 10 MG 24 hr tablet TAKE 1 TABLET BY MOUTH  DAILY (Patient taking differently: TAKE 1 TABLET (10mg ) BY MOUTH  DAILY) 90 tablet 1  . JANUVIA 100 MG tablet  Take 100 mg by mouth daily.     . metFORMIN (GLUCOPHAGE) 1000 MG tablet TAKE 1 TABLET BY MOUTH TWO  TIMES DAILY (Patient taking differently: TAKE 1 TABLET (1000mg ) BY MOUTH TWO  TIMES DAILY) 180 tablet 1  . oxybutynin (DITROPAN) 5 MG tablet TAKE 1 TABLET BY MOUTH TWO  TIMES DAILY (Patient taking differently: TAKE 1 TABLET (5mg ) BY MOUTH three TIMES DAILY) 180 tablet 1  . telmisartan (MICARDIS) 40 MG tablet TAKE 1 TABLET BY MOUTH  DAILY (Patient taking differently: TAKE 1 TABLET (40mg ) BY MOUTH  DAILY) 90 tablet 1    Home: Home Living Family/patient expects to be discharged to:: Private residence Living Arrangements: Spouse/significant other Available Help at Discharge: Family, Available 24 hours/day Type of Home: House Home Access: Stairs to enter Technical brewer of Steps: 4 Entrance Stairs-Rails: Left, Right, Can reach both Home Layout: One level Bathroom Shower/Tub: Multimedia programmer: Standard Home Equipment: None  Functional History: Prior Function Level of Independence: Independent Functional Status:  Mobility: Bed Mobility Overal bed mobility: Needs Assistance Bed Mobility: Rolling, Sidelying to Sit Rolling: Min assist Sidelying to sit: Mod assist General bed mobility comments: cues for sequence with  assist to rotate trunk , bring legs off of bed and elevate trunk  Transfers Overall transfer level: Needs assistance Transfers: Sit to/from Stand Sit to Stand: Mod assist, +2 physical assistance, +2 safety/equipment General transfer comment: mod assist for safety with rise as pt with significant left bias and requires frequent cues for midline and posture as well as awareness of foot position Ambulation/Gait Ambulation/Gait assistance: Mod assist, +2 safety/equipment, +2 physical assistance Ambulation Distance (Feet): 8 Feet Assistive device: 2 person hand held assist Gait Pattern/deviations: Step-to pattern, Narrow base of support General Gait Details:  pt with heavy left lean requiring max cues to shift weight to the right as well as cues for positioning and progression of LLE as lack of proprioception. Physical assist at trunk for balance and stability. Walked 8' x 2 with seated rest between Gait velocity interpretation: Below normal speed for age/gender    ADL:    Cognition: Cognition Overall Cognitive Status: Impaired/Different from baseline Orientation Level: Oriented X4 Cognition Arousal/Alertness: Awake/alert Behavior During Therapy: WFL for tasks assessed/performed Overall Cognitive Status: Impaired/Different from baseline Area of Impairment: Safety/judgement, Problem solving Safety/Judgement: Decreased awareness of safety, Decreased awareness of deficits Problem Solving: Requires verbal cues  Blood pressure 125/72, pulse 72, temperature 97.8 F (36.6 C), temperature source Oral, resp. rate 20, height 5\' 10"  (1.778 m), weight 94.1 kg (207 lb 7.3 oz), SpO2 94 %. Physical Exam  Vitals reviewed. Constitutional: He is oriented to person, place, and time.  HENT:  Head: Normocephalic.  Eyes: EOM are normal.  Neck: Normal range of motion. Neck supple. No thyromegaly present.  Cardiovascular: Normal rate, regular rhythm and normal heart sounds.  Respiratory: Effort normal and  breath sounds normal. No respiratory distress.  GI: Soft. Bowel sounds are normal. He exhibits no distension.  Neurological: He is alert and oriented to person, place, and time.  Follows commands  Skin: Skin is warm and dry.  Motor strength is 5/5 in the right deltoid, bicep, tricep, grip, hip flexor, knee extensor, ankle dorsiflexor, 4+ in the left deltoid bicep tricep grip hip flexor knee extensor ankle dorsiflexor Cerebellar shows no evidence dysmetria finger-nose-finger or heel-to-shin testing. Decreased visual fields left eye lower quadrants  Results for orders placed or performed during the hospital encounter of 06/23/17 (from the past 24 hour(s))    MRSA PCR Screening     Status: None   Collection Time: 06/23/17  3:50 PM  Result Value Ref Range   MRSA by PCR NEGATIVE NEGATIVE  Glucose, capillary     Status: Abnormal   Collection Time: 06/23/17  4:33 PM  Result Value Ref Range   Glucose-Capillary 185 (H) 65 - 99 mg/dL   Comment 1 Notify RN    Comment 2 Document in Chart   Glucose, capillary     Status: Abnormal   Collection Time: 06/23/17  8:43 PM  Result Value Ref Range   Glucose-Capillary 176 (H) 65 - 99 mg/dL  Glucose, capillary     Status: Abnormal   Collection Time: 06/24/17  7:57 AM  Result Value Ref Range   Glucose-Capillary 189 (H) 65 - 99 mg/dL  Glucose, capillary     Status: Abnormal   Collection Time: 06/24/17 11:23 AM  Result Value Ref Range   Glucose-Capillary 146 (H) 65 - 99 mg/dL   Ct Head Wo Contrast  Addendum Date: 06/23/2017   ADDENDUM REPORT: 06/23/2017 12:09 ADDENDUM: Dr. Leonel Ramsay called to discuss this case. Given that the patient fell this morning, the surrounding edema is more than one might expect and it is possible that this could be a nontraumatic intraparenchymal hemorrhage either due to stroke, mass or amyloid angiopathy. The differential diagnosis remains that of post traumatic intraparenchymal hemorrhage. Scalp soft tissue swelling is seen in the posterior midline. Electronically Signed   By: Nelson Chimes M.D.   On: 06/23/2017 12:09   Result Date: 06/23/2017 CLINICAL DATA:  Fall, hit back of head. EXAM: CT HEAD WITHOUT CONTRAST CT CERVICAL SPINE WITHOUT CONTRAST TECHNIQUE: Multidetector CT imaging of the head and cervical spine was performed following the standard protocol without intravenous contrast. Multiplanar CT image reconstructions of the cervical spine were also generated. COMPARISON:  03/14/2017 FINDINGS: CT HEAD FINDINGS Brain: There is an area of hemorrhage within the right posterior parietal lobe measuring 3.2 x 1.8 cm. Small amount of surrounding edema. No significant mass effect or  midline shift. No hydrocephalus. Vascular: No hyperdense vessel or unexpected calcification. Skull: No acute calvarial abnormality. Sinuses/Orbits: Mucosal thickening within the paranasal sinuses. No acute finding. Other: None CT CERVICAL SPINE FINDINGS Alignment: Normal Skull base and vertebrae: No fractured Soft tissues and spinal canal: Prevertebral soft tissues are normal. No epidural or paraspinal hematoma. Disc levels:  Diffuse degenerative disc disease and facet disease. Upper chest: No acute findings. Other: Dense carotid artery/ bulb calcifications bilaterally. IMPRESSION: Area of hemorrhage in the posterior right parietal lobe measuring 3.2 x 1.8 cm. No significant mass effect or midline shift. Chronic sinusitis changes. Cervical spondylosis.  No acute findings in the cervical spine. Critical Value/emergent results were called by telephone at the time of interpretation on 06/23/2017 at 10:58 am to Dr. Davonna Belling , who verbally acknowledged these results. Electronically Signed: By:  Rolm Baptise M.D. On: 06/23/2017 10:58   Ct Cervical Spine Wo Contrast  Addendum Date: 06/23/2017   ADDENDUM REPORT: 06/23/2017 12:09 ADDENDUM: Dr. Leonel Ramsay called to discuss this case. Given that the patient fell this morning, the surrounding edema is more than one might expect and it is possible that this could be a nontraumatic intraparenchymal hemorrhage either due to stroke, mass or amyloid angiopathy. The differential diagnosis remains that of post traumatic intraparenchymal hemorrhage. Scalp soft tissue swelling is seen in the posterior midline. Electronically Signed   By: Nelson Chimes M.D.   On: 06/23/2017 12:09   Result Date: 06/23/2017 CLINICAL DATA:  Fall, hit back of head. EXAM: CT HEAD WITHOUT CONTRAST CT CERVICAL SPINE WITHOUT CONTRAST TECHNIQUE: Multidetector CT imaging of the head and cervical spine was performed following the standard protocol without intravenous contrast. Multiplanar CT image  reconstructions of the cervical spine were also generated. COMPARISON:  03/14/2017 FINDINGS: CT HEAD FINDINGS Brain: There is an area of hemorrhage within the right posterior parietal lobe measuring 3.2 x 1.8 cm. Small amount of surrounding edema. No significant mass effect or midline shift. No hydrocephalus. Vascular: No hyperdense vessel or unexpected calcification. Skull: No acute calvarial abnormality. Sinuses/Orbits: Mucosal thickening within the paranasal sinuses. No acute finding. Other: None CT CERVICAL SPINE FINDINGS Alignment: Normal Skull base and vertebrae: No fractured Soft tissues and spinal canal: Prevertebral soft tissues are normal. No epidural or paraspinal hematoma. Disc levels:  Diffuse degenerative disc disease and facet disease. Upper chest: No acute findings. Other: Dense carotid artery/ bulb calcifications bilaterally. IMPRESSION: Area of hemorrhage in the posterior right parietal lobe measuring 3.2 x 1.8 cm. No significant mass effect or midline shift. Chronic sinusitis changes. Cervical spondylosis.  No acute findings in the cervical spine. Critical Value/emergent results were called by telephone at the time of interpretation on 06/23/2017 at 10:58 am to Dr. Davonna Belling , who verbally acknowledged these results. Electronically Signed: By: Rolm Baptise M.D. On: 06/23/2017 10:58   Mr Jodene Nam Head Wo Contrast  Result Date: 06/24/2017 CLINICAL DATA:  Right parietal hemorrhage.  Fall. EXAM: MRI HEAD WITHOUT AND WITH CONTRAST MRA HEAD WITHOUT CONTRAST TECHNIQUE: Multiplanar, multiecho pulse sequences of the brain and surrounding structures were obtained without and with intravenous contrast. Angiographic images of the head were obtained using MRA technique without contrast. CONTRAST:  59mL MULTIHANCE GADOBENATE DIMEGLUMINE 529 MG/ML IV SOLN COMPARISON:  Head CT 06/23/2017 FINDINGS: MRI HEAD FINDINGS Brain: The a medial right parietal parenchymal hematoma measures 3.7 x 2.0 cm, stable to  minimally larger than on the prior CT with the apparent change possibly due to differences in modality. Mild surrounding edema is similar to the prior CT, and there is no significant mass effect. No underlying enhancing mass is identified. There is a small vein coursing along the medial aspect of the hematoma, however no vascular nidus or enlarged draining vein is identified. This vein may be coursing within an effaced sulcus or could reflect a small developmental venous anomaly, with the former favored. No diffusion abnormality is seen around the hemorrhage to indicate an infarct. There is no evidence of acute or chronic intracranial hemorrhage elsewhere. Scattered small foci of T2 hyperintensity in the subcortical and deep cerebral white matter bilaterally are nonspecific but compatible with mild chronic small vessel ischemic disease. Minimal chronic small vessel changes are noted in the pons. There is mild cerebral atrophy. There is no midline shift or extra-axial fluid collection. Vascular: Major intracranial vascular flow voids are preserved. Skull  and upper cervical spine: Unremarkable bone marrow signal. Sinuses/Orbits: Bilateral cataract extraction. Moderate mucosal thickening in the bilateral ethmoid and right maxillary sinuses. Small to moderate volume fluid in the right maxillary sinus. Small left mastoid effusion. Other: None. MRA HEAD FINDINGS The visualized distal vertebral arteries are patent to the basilar. Patent PICA, AICA, and SCA origins are identified bilaterally. The basilar artery is widely patent. There are medium sized posterior communicating arteries bilaterally. The PCAs are patent without evidence of significant stenosis. The internal carotid arteries are widely patent from skullbase to carotid termini. The ACAs and MCAs are patent without evidence of proximal branch occlusion or significant proximal stenosis. An aneurysm projecting anterosuperiorly in the anterior communicating region  measures 4 x 2 mm. The MRA did not extend completely through the right parietal hemorrhage, however no abnormal arterial structures are seen along the inferior aspect of the hemorrhage. IMPRESSION: 1. 3.7 cm right parietal hemorrhage with unchanged mild surrounding edema and no mass effect. No surrounding infarct, underlying mass, or definite vascular malformation identified. No chronic hemorrhages to suggest cerebral amyloid angiopathy. 2. Mild chronic small vessel ischemic disease. 3. Patent circle of Willis without proximal branch occlusion or significant stenosis. 4. 4 mm anterior communicating aneurysm. Electronically Signed   By: Logan Bores M.D.   On: 06/24/2017 11:41   Mr Jeri Cos DV Contrast  Result Date: 06/24/2017 CLINICAL DATA:  Right parietal hemorrhage.  Fall. EXAM: MRI HEAD WITHOUT AND WITH CONTRAST MRA HEAD WITHOUT CONTRAST TECHNIQUE: Multiplanar, multiecho pulse sequences of the brain and surrounding structures were obtained without and with intravenous contrast. Angiographic images of the head were obtained using MRA technique without contrast. CONTRAST:  30mL MULTIHANCE GADOBENATE DIMEGLUMINE 529 MG/ML IV SOLN COMPARISON:  Head CT 06/23/2017 FINDINGS: MRI HEAD FINDINGS Brain: The a medial right parietal parenchymal hematoma measures 3.7 x 2.0 cm, stable to minimally larger than on the prior CT with the apparent change possibly due to differences in modality. Mild surrounding edema is similar to the prior CT, and there is no significant mass effect. No underlying enhancing mass is identified. There is a small vein coursing along the medial aspect of the hematoma, however no vascular nidus or enlarged draining vein is identified. This vein may be coursing within an effaced sulcus or could reflect a small developmental venous anomaly, with the former favored. No diffusion abnormality is seen around the hemorrhage to indicate an infarct. There is no evidence of acute or chronic intracranial  hemorrhage elsewhere. Scattered small foci of T2 hyperintensity in the subcortical and deep cerebral white matter bilaterally are nonspecific but compatible with mild chronic small vessel ischemic disease. Minimal chronic small vessel changes are noted in the pons. There is mild cerebral atrophy. There is no midline shift or extra-axial fluid collection. Vascular: Major intracranial vascular flow voids are preserved. Skull and upper cervical spine: Unremarkable bone marrow signal. Sinuses/Orbits: Bilateral cataract extraction. Moderate mucosal thickening in the bilateral ethmoid and right maxillary sinuses. Small to moderate volume fluid in the right maxillary sinus. Small left mastoid effusion. Other: None. MRA HEAD FINDINGS The visualized distal vertebral arteries are patent to the basilar. Patent PICA, AICA, and SCA origins are identified bilaterally. The basilar artery is widely patent. There are medium sized posterior communicating arteries bilaterally. The PCAs are patent without evidence of significant stenosis. The internal carotid arteries are widely patent from skullbase to carotid termini. The ACAs and MCAs are patent without evidence of proximal branch occlusion or significant proximal stenosis. An aneurysm projecting  anterosuperiorly in the anterior communicating region measures 4 x 2 mm. The MRA did not extend completely through the right parietal hemorrhage, however no abnormal arterial structures are seen along the inferior aspect of the hemorrhage. IMPRESSION: 1. 3.7 cm right parietal hemorrhage with unchanged mild surrounding edema and no mass effect. No surrounding infarct, underlying mass, or definite vascular malformation identified. No chronic hemorrhages to suggest cerebral amyloid angiopathy. 2. Mild chronic small vessel ischemic disease. 3. Patent circle of Willis without proximal branch occlusion or significant stenosis. 4. 4 mm anterior communicating aneurysm. Electronically Signed   By:  Logan Bores M.D.   On: 06/24/2017 11:41    Assessment/Plan: Diagnosis: Right posterior parietal intraparenchymal hemorrhage with gait disorder and mild left hemiparesis. 1. Does the need for close, 24 hr/day medical supervision in concert with the patient's rehab needs make it unreasonable for this patient to be served in a less intensive setting? Yes 2. Co-Morbidities requiring supervision/potential complications: Diabetes, hyperlipidemia, hypertension 3. Due to bladder management, bowel management, safety, skin/wound care, disease management, medication administration, pain management and patient education, does the patient require 24 hr/day rehab nursing? Yes 4. Does the patient require coordinated care of a physician, rehab nurse, PT (1-2 hrs/day, 5 days/week) and OT (1-2 hrs/day, 5 days/week) to address physical and functional deficits in the context of the above medical diagnosis(es)? Yes Addressing deficits in the following areas: balance, endurance, locomotion, strength, transferring, bowel/bladder control, bathing, dressing, feeding, grooming, toileting and psychosocial support 5. Can the patient actively participate in an intensive therapy program of at least 3 hrs of therapy per day at least 5 days per week? Yes 6. The potential for patient to make measurable gains while on inpatient rehab is excellent 7. Anticipated functional outcomes upon discharge from inpatient rehab are modified independent  with PT, modified independent with OT, n/a with SLP. 8. Estimated rehab length of stay to reach the above functional goals is: 10-14 days 9. Anticipated D/C setting: Home 10. Anticipated post D/C treatments: [place] therapy 11. Overall Rehab/Functional Prognosis: excellent  RECOMMENDATIONS: This patient's condition is appropriate for continued rehabilitative care in the following setting: CIR Patient has agreed to participate in recommended program. Yes Note that insurance prior authorization  may be required for reimbursement for recommended care.  Comment:   Charlett Blake M.D. [facility] FAAPM&R (Sports Med, Neuromuscular Med) Diplomate Am Board of Electrodiagnostic Med  Elizabeth Sauer 06/24/2017

## 2017-06-24 NOTE — Progress Notes (Signed)
NEUROHOSPITALISTS STROKE TEAM - DAILY PROGRESS NOTE   ADMISSION HISTORY:  Frank [name] is a 76 y.o. male with a history of diabetes, hypertension, hyperlipidemia who presents after a fall.  He states that he felt like he fell because he slipped on ice.  He did not identify any symptoms prior to that, and states that he does not feel many symptoms afterwards other than some mild dizziness.  As part of his workup he had a CT scan which identified a small intraparenchymal hemorrhage in the right posterior parietal region.  It was unclear as to whether this was there prior to the fall, possibly contributing versus resulting from the fall.  It is an unusual location for a posttraumatic hemorrhage and therefore we decided to treat as potentially nontraumatic hemorrhage.  LKW: Unclear tpa given?: no, ICH ICH Score: 0  SUBJECTIVE (INTERVAL HISTORY) Wife and daughter is at the bedside. Patient is found laying in bed in NAD. Overall he feels his condition is gradually improving. Voices no new complaints. No new events reported overnight.  He states he slipped and fell and hit the back of his head.  He had minor abrasion on his  head but denies loss of consciousness.  He did help to get up and had mild headache.  Family state that he has had frequent recent falls and is worried about his balance  OBJECTIVE Lab Results: CBC:  Recent Labs  Lab 06/23/17 1018  WBC 6.7  HGB 10.5*  HCT 33.0*  MCV 85.9  PLT 150   BMP: Recent Labs  Lab 06/23/17 1018  NA 136  K 5.0  CL 103  CO2 25  GLUCOSE 216*  BUN 36*  CREATININE 1.22  CALCIUM 9.2   Cardiac Enzymes:  Recent Labs  Lab 06/23/17 1235  TROPONINI <0.03   Coagulation Studies:  Recent Labs    06/23/17 1235  APTT 24  INR 0.94   Urinalysis:  Recent Labs  Lab 06/23/17 1150  COLORURINE YELLOW  APPEARANCEUR CLEAR  LABSPEC 1.025  PHURINE 6.0  GLUCOSEU NEGATIVE  HGBUR  TRACE*  BILIRUBINUR NEGATIVE  KETONESUR 15*  PROTEINUR 30*  NITRITE NEGATIVE  LEUKOCYTESUR NEGATIVE   PHYSICAL EXAM Temp:  [97.8 F (36.6 C)-98.1 F (36.7 C)] 97.8 F (36.6 C) (12/13 1200) Pulse Rate:  [72-107] 72 (12/13 1300) Resp:  [11-21] 20 (12/13 1300) BP: (107-144)/(50-112) 125/72 (12/13 1301) SpO2:  [89 %-100 %] 94 % (12/13 1300) General - Well nourished, well developed, in no apparent distress Respiratory - Lungs clear bilaterally. No wheezing. Cardiovascular - Regular rate and rhythm  Neuro: Mental Status: Patient is awake, alert, oriented to person, place, month, year, and situation. Patient is able to give a clear and coherent history. No signs of aphasia or neglect Cranial Nerves: II: Visual Fields are full. Pupils are equal, round, and reactive to light.   III,IV, VI: EOMI without ptosis or diploplia.  V: Facial sensation is symmetric to temperature VII: Facial movement is symmetric.  VIII: hearing is intact to voice X: Uvula elevates symmetrically XI: Shoulder shrug is symmetric. XII: tongue is midline without atrophy or fasciculations.  Motor: Tone is normal. Bulk is normal. 5/5 strength was present in all four extremities. Very mild Left sided weakness noted with fine motor movements.  Orbits right over left upper extremity.  Minimum weakness of left grip Sensory: Sensation is symmetric to light touch and temperature in the arms and legs. Cerebellar: FNF  intact bilaterally  IMAGING: I have personally reviewed the  radiological images below and agree with the radiology interpretations. Ct Head & CT Cervical Spine Wo Contrast Addendum Date: 06/23/2017   ADDENDUM REPORT: 06/23/2017 12:09 ADDENDUM: Dr. Leonel Ramsay called to discuss this case. Given that the patient fell this morning, the surrounding edema is more than one might expect and it is possible that this could be a nontraumatic intraparenchymal hemorrhage either due to stroke, mass or amyloid  angiopathy. The differential diagnosis remains that of post traumatic intraparenchymal hemorrhage. Scalp soft tissue swelling is seen in the posterior midline. Electronically Signed   By: Nelson Chimes M.D.   On: 06/23/2017 12:09   Result Date: 06/23/2017 IMPRESSION: Area of hemorrhage in the posterior right parietal lobe measuring 3.2 x 1.8 cm. No significant mass effect or midline shift. Chronic sinusitis changes. Cervical spondylosis.  No acute findings in the cervical spine.   MRI/ MRA Head & Brain Wo Contrast Result Date: 06/24/2017 IMPRESSION: 1. 3.7 cm right parietal hemorrhage with unchanged mild surrounding edema and no mass effect. No surrounding infarct, underlying mass, or definite vascular malformation identified. No chronic hemorrhages to suggest cerebral amyloid angiopathy. 2. Mild chronic small vessel ischemic disease. 3. Patent circle of Willis without proximal branch occlusion or significant stenosis. 4. 4 mm anterior communicating aneurysm. Electronically Signed   By: Logan Bores M.D.   On: 06/24/2017 11:41   Echocardiogram:                                            PENDING     ASSESSMENT: [name] is a 76 y.o. male with PMH of HTN, HLD, DM and remote Hx of CVA who presents with an intra-parenchymal hematoma from a fall on the ice.  Given how mild his symptoms are at the current time, I do not think that he is a surgical candidate.  It is likey a posttraumatic event.  3.7 cm right parietal hemorrhage with unchanged mild surrounding edema and no mass effect  Suspected Etiology: Likely posttraumatic, possible HTN hemorrhage contributed, patient had elevated blood pressure on admission.   Resultant Symptoms: Minimal left-sided weakness noted on today's exam.  Admission symptoms of dizziness, slurred speech, headache-all resolved at this time Stroke Risk Factors: diabetes mellitus, hyperlipidemia and hypertension Other Stroke Risk Factors: Advanced age, Hx  stroke, Obstructive sleep apnea, on CPAP at home  Outstanding Stroke Work-up Studies: Echocardiogram: not done               PENDING  06/24/2017: Patient's neuro exam is stable with minimal left-sided weakness noted.  He denies headache.  Hemorrhage most likely posttraumatic due to patient falling on the ice backwards and hitting his head.  Patient's blood pressures have remained stable.  Home blood pressure medications have been restarted.  Patient will be monitored overnight.  He will be evaluated for possible CIR admission.  His family explains an approximate 2-year history of stumbling and unsteadiness with frequent falling.  Patient states during these episodes he does not feel dizziness or experience a headache.  He states he just feels off balance. He has had no workup for these symptoms.  PLAN  06/24/2017: Continue Statin HOLD ASA for now Frequent neuro checks Telemetry monitoring PT/OT/SLP Consult PM & Rehab Ongoing aggressive stroke risk factor management Patient counseled to be compliant with his statin medication Follow [facility] in 6 weeks  HX OF  STROKES: 20 years ago - No residual deficits  MEDICAL ISSUES: Anemia- Likely of chronic disease Hgb 10.5, Hct 33.0 today 12/13 Repeat labs in AM  HYPERTENSION: Stable SBP goal less than 140/90  Long term BP goal normotensive. Restart home B/P medications today 12/13 Home Meds: Cardura, Micardis  HYPERLIPIDEMIA:    Component Value Date/Time   CHOL 140 02/01/2017 0852   TRIG 140.0 02/01/2017 0852   HDL 50.20 02/01/2017 0852   CHOLHDL 3 02/01/2017 0852   VLDL 28.0 02/01/2017 0852   LDLCALC 62 02/01/2017 0852  Home Meds:  Lipitor 10 mg LDL  goal < 70 Continued on  Lipitor to 10 mg daily Continue statin at discharge  DIABETES: Lab Results  Component Value Date   HGBA1C 7.2 (H) 05/04/2017  HgbA1c goal < 7.0 Currently on: Novolog Continue CBG monitoring and SSI to maintain glucose  140-180 mg/dl DM education   4 mm anterior communicating aneurysm asymptomatic Outpatient neurology surveillance  Hx of Frequent Falls over the past 2 years Outpatient neurology workup  Other Active Problems: Active Problems:   Intracranial hematoma following injury ([place])   ICH (intracerebral hemorrhage) [facility])  Hospital day # 1 VTE prophylaxis: SCD's  Diet : Diet Carb Modified Fluid consistency: Thin; Room service appropriate? Yes   FAMILY UPDATES: family at bedside  TEAM UPDATES: Garvin Fila, MD     Prior Home Stroke Medications:  aspirin 81 mg daily and Lipitor 10 mg  Discharge Stroke Meds:  Please discharge patient on No antithrombotic and Lipitor 10 mg   Disposition: 01-Home or Self Care Therapy Recs:  CIR Home Equipment:  PENDING Follow Up:  Follow-up Information    Garvin Fila, MD. Schedule an appointment as soon as possible for a visit in 6 week(s).   Specialties:  Neurology, Radiology Contact information: [address] [place] [address] [phone]          Janith Lima, MD -PCP Follow up in 1-2 weeks  Renie Ora Stroke Neurology Team 06/24/2017 4:05 PM I have personally examined this patient, reviewed notes, independently viewed imaging studies, participated in medical decision making and plan of care.ROS completed by me personally and pertinent positives fully documented  I have made any additions or clarifications directly to the above note. Agree with note above.  He has presented with likely traumatic intracerebral hemorrhage.  He needs close neurological monitoring and strict blood pressure control.  MRI has ruled out any underlying vascular or structural lesions.  Long discussion at the bedside with the patient, wife and daughter and answered questions.  Mobilize out of bed and therapy consults. This patient is critically ill and at significant risk of neurological worsening, death and care requires constant  monitoring of vital signs, hemodynamics,respiratory and cardiac monitoring, extensive review of multiple databases, frequent neurological assessment, discussion with family, other specialists and medical decision making of high complexity.I have made any additions or clarifications directly to the above note.This critical care time does not reflect procedure time, or teaching time or supervisory time of PA/NP/Med Resident etc but could involve care discussion time.  I spent 30 minutes of neurocritical care time  in the care of  this patient.   Antony Contras, MD Medical Director [place] Pager: [phone] 06/24/2017 5:52 PM  To contact Stroke Continuity provider, please refer to [email or web address] After hours, contact General Neurology

## 2017-06-24 NOTE — Progress Notes (Signed)
  Echocardiogram 2D Echocardiogram has been performed.  Johny Chess 06/24/2017, 5:26 PM

## 2017-06-24 NOTE — Evaluation (Addendum)
Physical Therapy Evaluation Patient Details Name: Frank [name] MRN: [id number] DOB: 1940/12/31 Today's Date: 06/24/2017   History of Present Illness  76 y.o. male with a history of diabetes, hypertension, hyperlipidemia who presents after a fall with intraparenchymal hemorrhage in the right posterior parietal region.   Clinical Impression  Pt very pleasant and wanting to get out of bed. Pt with significant left bias in sitting and standing with difficulty finding and maintaining midline positioning. Pt with initial report of dizziness with sitting without significant change in BP 125/72. Pt with decreased balance, functional mobility, gait and independence who will benefit from acute therapy for balance and gait training as well as functional mobility to decrease burden of care and return pt to PLOF. Family reports pt has had 5 falls this year and educated for need for assist with all mobility     Follow Up Recommendations CIR;Supervision/Assistance - 24 hour    Equipment Recommendations  Other (comment)(TBD)    Recommendations for Other Services       Precautions / Restrictions Precautions Precautions: Fall Precaution Comments: left bias      Mobility  Bed Mobility Overal bed mobility: Needs Assistance Bed Mobility: Rolling;Sidelying to Sit Rolling: Min assist Sidelying to sit: Mod assist       General bed mobility comments: cues for sequence with  assist to rotate trunk , bring legs off of bed and elevate trunk   Transfers Overall transfer level: Needs assistance   Transfers: Sit to/from Stand Sit to Stand: Mod assist;+2 physical assistance;+2 safety/equipment         General transfer comment: mod assist for safety with rise as pt with significant left bias and requires frequent cues for midline and posture as well as awareness of foot position  Ambulation/Gait Ambulation/Gait assistance: Mod assist;+2 safety/equipment;+2 physical assistance Ambulation  Distance (Feet): 8 Feet Assistive device: 2 person hand held assist Gait Pattern/deviations: Step-to pattern;Narrow base of support   Gait velocity interpretation: Below normal speed for age/gender General Gait Details: pt with heavy left lean requiring max cues to shift weight to the right as well as cues for positioning and progression of LLE as lack of proprioception. Physical assist at trunk for balance and stability. Walked 8' x 2 with seated rest between  [employer] Mobility    Modified Rankin (Stroke Patients Only) Modified Rankin (Stroke Patients Only) Pre-Morbid Rankin Score: No significant disability Modified Rankin: Moderately severe disability     Balance Overall balance assessment: Needs assistance   Sitting balance-Leahy Scale: Poor Sitting balance - Comments: cues for position in space with use of RUE to hold rail to correct with cues Postural control: Left lateral lean   Standing balance-Leahy Scale: Zero Standing balance comment: 2 person assist in standing to correct to midline due to left lean                             Pertinent Vitals/Pain Pain Assessment: No/denies pain    Home Living Family/patient expects to be discharged to:: Private residence Living Arrangements: Spouse/significant other Available Help at Discharge: Family;Available 24 hours/day Type of Home: House Home Access: Stairs to enter Entrance Stairs-Rails: Left;Right;Can reach both Entrance Stairs-Number of Steps: 4 Home Layout: One level Home Equipment: None      Prior Function Level of Independence: Independent               Hand  Dominance        Extremity/Trunk Assessment   Upper Extremity Assessment Upper Extremity Assessment: Defer to OT evaluation    Lower Extremity Assessment Lower Extremity Assessment: (5/5 hip flexion, knee flexion and extension with report of decreased sensation LLE)    Cervical / Trunk  Assessment Cervical / Trunk Assessment: (forward head and rounded shoulders)  Communication   Communication: No difficulties  Cognition Arousal/Alertness: Awake/alert Behavior During Therapy: WFL for tasks assessed/performed Overall Cognitive Status: Impaired/Different from baseline Area of Impairment: Safety/judgement;Problem solving                         Safety/Judgement: Decreased awareness of safety;Decreased awareness of deficits   Problem Solving: Requires verbal cues        General Comments      Exercises     Assessment/Plan    PT Assessment Patient needs continued PT services  PT Problem List Decreased mobility;Decreased safety awareness;Decreased coordination;Decreased knowledge of precautions;Decreased activity tolerance;Decreased cognition;Decreased balance;Decreased knowledge of use of DME       PT Treatment Interventions Gait training;Patient/family education;Therapeutic activities;DME instruction;Cognitive remediation;Balance training;Functional mobility training;Neuromuscular re-education;Stair training    PT Goals (Current goals can be found in the Care Plan section)  Acute Rehab PT Goals Patient Stated Goal: 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 PT Goal Formulation: With patient/family Time For Goal Achievement: 07/08/17 Potential to Achieve Goals: Good    Frequency Min 4X/week   Barriers to discharge        Co-evaluation PT/OT/SLP Co-Evaluation/Treatment: Yes Reason for Co-Treatment: Complexity of the patient's impairments (multi-system involvement);For patient/therapist safety PT goals addressed during session: Mobility/safety with mobility;Balance         AM-PAC PT "6 Clicks" Daily Activity  Outcome Measure Difficulty turning over in bed (including adjusting bedclothes, sheets and blankets)?: Unable Difficulty moving from lying on back to sitting on the side of the bed? : Unable Difficulty sitting down on and  standing up from a chair with arms (e.g., wheelchair, bedside commode, etc,.)?: Unable Help needed moving to and from a bed to chair (including a wheelchair)?: A Lot Help needed walking in hospital room?: A Lot Help needed climbing 3-5 steps with a railing? : Total 6 Click Score: 8    End of Session Equipment Utilized During Treatment: Gait belt Activity Tolerance: Patient tolerated treatment well Patient left: in chair;with chair alarm set;with family/visitor present;with nursing/sitter in room Nurse Communication: Mobility status;Precautions;Need for lift equipment([place]) PT Visit Diagnosis: Other abnormalities of gait and mobility (R26.89);Unsteadiness on feet (R26.81);Repeated falls (R29.6)    Time: 1751-0258 PT Time Calculation (min) (ACUTE ONLY): 27 min   Charges:   PT Evaluation $PT Eval Moderate Complexity: 1 Mod     PT G Codes:        Elwyn Reach, PT [phone]   Willard Madrigal B Terez Montee 06/24/2017, 1:10 PM

## 2017-06-24 NOTE — Evaluation (Signed)
Speech Language Pathology Evaluation Patient Details Name: Frank [name] MRN: [id number] DOB: 1941/01/29 Today's Date: 06/24/2017 Time: 2585-2778 SLP Time Calculation (min) (ACUTE ONLY): 15 min  Problem List:  Patient Active Problem List   Diagnosis Date Noted  . Intracranial hematoma following injury ([place]) 06/23/2017  . ICH (intracerebral hemorrhage) ([place]) 06/23/2017  . Morbid obesity ([place]) 05/12/2017  . OAB (overactive bladder) 11/26/2014  . Routine general medical examination at a health care facility 04/06/2014  . Vitamin D deficiency 07/23/2010  . Obstructive sleep apnea 09/13/2008  . Hyperlipidemia with target LDL less than 100 07/27/2007  . BPH (benign prostatic hyperplasia) 06/21/2007  . 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 Type II diabetes mellitus with manifestations ([place]) 04/15/2006  . B12 deficiency anemia 04/15/2006  . Essential hypertension 04/15/2006  . ALLERGIC RHINITIS 04/15/2006   Past Medical History:  Past Medical History:  Diagnosis Date  . Allergy    Rhinitis  . Anemia    NOS iron deficient and B12 deficient  . Arthritis   . Diabetes mellitus    Type 2  . GERD (gastroesophageal reflux disease)   . Hyperlipidemia   . Hypertension   . 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 OSA (obstructive sleep apnea)    cpap   Past Surgical History:  Past Surgical History:  Procedure Laterality Date  . APPENDECTOMY    . arm fracture Left 2011   with hardware  . COLONOSCOPY    . ESOPHAGOGASTRODUODENOSCOPY  2006   gastritis  . ROTATOR CUFF REPAIR    . SEPTOPLASTY  1959   Deviated Septum  . 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 He states that he felt like he fell because he slipped on ice. He did not identify any symptoms prior to that, and states that he did not feel many symptoms afterwards other than some mild  dizziness. As part of his workup he had a CT scan which identified a small intraparenchymal hemorrhage in the right posterior parietal region. Per Neuro MD, unclear as to whether this was there prior to the fall, possibly contributing versus resulting from the fall, unusual location for a posttraumatic hemorrhage and therefore decided to treat as potentially nontraumatic hemorrhage.   Assessment / Plan / Recommendation Clinical Impression  Cognitive-linguistic function WFL with patient scoring 29-30 on the [place]. No f/u SLP services indicated at this time.     SLP Assessment  SLP Recommendation/Assessment: Patient does not need any further Speech Lanaguage Pathology Services SLP Visit Diagnosis: Cognitive communication deficit (R41.841)    Follow Up Recommendations  None          SLP Evaluation Cognition  Overall Cognitive Status: Within Functional Limits for tasks assessed Orientation Level: Oriented X4       Comprehension  Auditory Comprehension Overall Auditory Comprehension: Appears within functional limits for tasks assessed Visual Recognition/Discrimination Discrimination: Within Function Limits Reading Comprehension Reading Status: Within funtional limits    Expression Expression Primary Mode of Expression: Verbal Verbal Expression Overall Verbal Expression: Appears within functional limits for tasks assessed Initiation: No impairment   Oral / Motor  Oral Motor/Sensory Function Overall Oral Motor/Sensory Function: Within functional limits Motor Speech Overall Motor Speech: Appears within functional limits for tasks assessed   GO                   [name] MA, CCC-SLP [phone]  [name] 06/24/2017, 3:33 PM

## 2017-06-24 NOTE — Progress Notes (Signed)
Inpatient Rehabilitation  Per PT request, patient was screened by Charie Pinkus for appropriateness for an Inpatient Acute Rehab consult.  At this time we are recommending an Inpatient Rehab consult.  Please order if you are agreeable.    Sybella Harnish, M.A., CCC/SLP Admission Coordinator  [facility]  Cell [phone]  

## 2017-06-24 NOTE — Progress Notes (Signed)
SLP Cancellation Note  Patient Details Name: Frank [name] MRN: [id number] DOB: 1941-04-02   Cancelled treatment:       Reason Eval/Treat Not Completed: Patient at procedure or test/unavailable  [name] [place], CCC-SLP [phone]  [name] [place] 06/24/2017, 10:45 AM

## 2017-06-25 LAB — CBC
HCT: 32.9 % — ABNORMAL LOW (ref 39.0–52.0)
Hemoglobin: 10.7 g/dL — ABNORMAL LOW (ref 13.0–17.0)
MCH: 27.2 pg (ref 26.0–34.0)
MCHC: 32.5 g/dL (ref 30.0–36.0)
MCV: 83.7 fL (ref 78.0–100.0)
Platelets: 146 10*3/uL — ABNORMAL LOW (ref 150–400)
RBC: 3.93 MIL/uL — ABNORMAL LOW (ref 4.22–5.81)
RDW: 13.2 % (ref 11.5–15.5)
WBC: 7.4 10*3/uL (ref 4.0–10.5)

## 2017-06-25 LAB — GLUCOSE, CAPILLARY
Glucose-Capillary: 206 mg/dL — ABNORMAL HIGH (ref 65–99)
Glucose-Capillary: 223 mg/dL — ABNORMAL HIGH (ref 65–99)
Glucose-Capillary: 182 mg/dL — ABNORMAL HIGH (ref 65–99)
Glucose-Capillary: 140 mg/dL — ABNORMAL HIGH (ref 65–99)

## 2017-06-25 LAB — BASIC METABOLIC PANEL
Anion gap: 7 (ref 5–15)
BUN: 19 mg/dL (ref 6–20)
CO2: 27 mmol/L (ref 22–32)
Calcium: 9 mg/dL (ref 8.9–10.3)
Chloride: 101 mmol/L (ref 101–111)
Creatinine, Ser: 1.12 mg/dL (ref 0.61–1.24)
GFR calc Af Amer: >60 mL/min (ref >60)
GFR calc non Af Amer: >60 mL/min (ref >60)
Glucose, Bld: 193 mg/dL — ABNORMAL HIGH (ref 65–99)
Potassium: 4 mmol/L (ref 3.5–5.1)
Sodium: 135 mmol/L (ref 135–145)

## 2017-06-25 NOTE — Progress Notes (Signed)
NEUROHOSPITALISTS STROKE TEAM - DAILY PROGRESS NOTE   ADMISSION HISTORY:  Frank [name] is a 76 y.o. male with a history of diabetes, hypertension, hyperlipidemia who presents after a fall.  He states that he felt like he fell because he slipped on ice.  He did not identify any symptoms prior to that, and states that he does not feel many symptoms afterwards other than some mild dizziness.  As part of his workup he had a CT scan which identified a small intraparenchymal hemorrhage in the right posterior parietal region.  It was unclear as to whether this was there prior to the fall, possibly contributing versus resulting from the fall.  It is an unusual location for a posttraumatic hemorrhage and therefore we decided to treat as potentially nontraumatic hemorrhage.  LKW: Unclear tpa given?: no, ICH ICH Score: 0  SUBJECTIVE (INTERVAL HISTORY) Wife and daughter is at the bedside. Patient is found laying in bed in NAD. Overall he feels his condition is gradually improving. Voices no new complaints. No new events reported overnight.  Family is looking forward to patient going to CIR.  OBJECTIVE Lab Results: CBC:  Recent Labs  Lab 06/23/17 1018 06/25/17 0434  WBC 6.7 7.4  HGB 10.5* 10.7*  HCT 33.0* 32.9*  MCV 85.9 83.7  PLT 150 146*   BMP: Recent Labs  Lab 06/23/17 1018 06/25/17 0434  NA 136 135  K 5.0 4.0  CL 103 101  CO2 25 27  GLUCOSE 216* 193*  BUN 36* 19  CREATININE 1.22 1.12  CALCIUM 9.2 9.0   Cardiac Enzymes:  Recent Labs  Lab 06/23/17 1235  TROPONINI <0.03   Coagulation Studies:  Recent Labs    06/23/17 1235  APTT 24  INR 0.94   Urinalysis:  Recent Labs  Lab 06/23/17 1150  COLORURINE YELLOW  APPEARANCEUR CLEAR  LABSPEC 1.025  PHURINE 6.0  GLUCOSEU NEGATIVE  HGBUR TRACE*  BILIRUBINUR NEGATIVE  KETONESUR 15*  PROTEINUR 30*  NITRITE NEGATIVE  LEUKOCYTESUR NEGATIVE   PHYSICAL  EXAM Temp:  [97.6 F (36.4 C)-98.2 F (36.8 C)] 97.8 F (36.6 C) (12/14 0800) Pulse Rate:  [69-105] 79 (12/14 1000) Resp:  [10-24] 17 (12/14 1000) BP: (115-154)/(57-119) 127/58 (12/14 1000) SpO2:  [86 %-99 %] 94 % (12/14 1000) General - Well nourished, well developed, in no apparent distress Respiratory - Lungs clear bilaterally. No wheezing. Cardiovascular - Regular rate and rhythm  Neuro: Mental Status: Patient is awake, alert, oriented to person, place, month, year, and situation. Patient is able to give a clear and coherent history. No signs of aphasia or neglect Cranial Nerves: II: Visual Fields are full. Pupils are equal, round, and reactive to light.   III,IV, VI: EOMI without ptosis or diploplia.  V: Facial sensation is symmetric to temperature VII: Facial movement is symmetric.  VIII: hearing is intact to voice X: Uvula elevates symmetrically XI: Shoulder shrug is symmetric. XII: tongue is midline without atrophy or fasciculations.  Motor: Tone is normal. Bulk is normal. 5/5 strength was present in all four extremities. Very mild Left sided weakness noted with fine motor movements.  Orbits right over left upper extremity.  Minimum weakness of left grip Sensory: Sensation is symmetric to light touch and temperature in the arms and legs. Cerebellar: FNF  intact bilaterally  IMAGING: I have personally reviewed the radiological images below and agree with the radiology interpretations. Ct Head & CT Cervical Spine Wo Contrast Addendum Date: 06/23/2017   ADDENDUM REPORT: 06/23/2017 12:09 ADDENDUM: Dr. Leonel Ramsay  called to discuss this case. Given that the patient fell this morning, the surrounding edema is more than one might expect and it is possible that this could be a nontraumatic intraparenchymal hemorrhage either due to stroke, mass or amyloid angiopathy. The differential diagnosis remains that of post traumatic intraparenchymal hemorrhage. Scalp soft tissue swelling is  seen in the posterior midline. Electronically Signed   By: Nelson Chimes M.D.   On: 06/23/2017 12:09   Result Date: 06/23/2017 IMPRESSION: Area of hemorrhage in the posterior right parietal lobe measuring 3.2 x 1.8 cm. No significant mass effect or midline shift. Chronic sinusitis changes. Cervical spondylosis.  No acute findings in the cervical spine.   MRI/ MRA Head & Brain Wo Contrast Result Date: 06/24/2017 IMPRESSION: 1. 3.7 cm right parietal hemorrhage with unchanged mild surrounding edema and no mass effect. No surrounding infarct, underlying mass, or definite vascular malformation identified. No chronic hemorrhages to suggest cerebral amyloid angiopathy. 2. Mild chronic small vessel ischemic disease. 3. Patent circle of Willis without proximal branch occlusion or significant stenosis. 4. 4 mm anterior communicating aneurysm. Electronically Signed   By: Logan Bores M.D.   On: 06/24/2017 11:41   Echocardiogram:     Study Conclusions - Left ventricle: The cavity size was normal. There was mild   concentric hypertrophy. Systolic function was normal. The   estimated ejection fraction was in the range of 55% to 60%. There   was an increased relative contribution of atrial contraction to   ventricular filling. Doppler parameters are consistent with   abnormal left ventricular relaxation (grade 1 diastolic   dysfunction). - Aortic valve: Mildly calcified leaflets. There was trivial   regurgitation. - Mitral valve: Calcified annulus. Valve area by pressure   half-time: 2 cm^2. - Left atrium: The atrium was mildly dilated. - Pulmonary arteries: Systolic pressure could not be accurately   estimated                                             ASSESSMENT: [name] is a 76 y.o. male with PMH of HTN, HLD, DM and remote Hx of CVA who presents with an intra-parenchymal hematoma from a fall on the ice.  Given how mild his symptoms are at the current time, I do not think that he is  a surgical candidate.  It is likey a posttraumatic event.  3.7 cm right parietal hemorrhage with unchanged mild surrounding edema and no mass effect  Suspected Etiology: Indeterminate, likely posttraumatic, possible HTN hemorrhage contributed, patient had elevated blood pressure on admission.   Resultant Symptoms: Minimal left-sided weakness noted on today's exam.  Admission symptoms of dizziness, slurred speech, headache-all resolved at this time Stroke Risk Factors: diabetes mellitus, hyperlipidemia and hypertension Other Stroke Risk Factors: Advanced age, Hx stroke, Obstructive sleep apnea, on CPAP at home  Outstanding Stroke Work-up Studies:    Workup completed  06/24/2017: Patient's neuro exam is stable with minimal left-sided weakness noted.  He denies headache.  Hemorrhage most likely posttraumatic due to patient falling on the ice backwards and hitting his head.  Patient's blood pressures have remained stable.  Home blood pressure medications have been restarted.  Patient will be monitored overnight.  He will be evaluated for possible CIR admission.  His family explains an approximate 2-year history of stumbling and unsteadiness with frequent falling.  Patient states during these episodes  he does not feel dizziness or experience a headache.  He states he just feels off balance. He has had no workup for these symptoms.  He has presented with likely traumatic intracerebral hemorrhage.  He needs close neurological monitoring and strict blood pressure control.  MRI has ruled out any underlying vascular or structural lesions.  Long discussion at the bedside with the patient, wife and daughter and answered questions.  Mobilize out of bed and therapy consults  06/25/2017: Patient's neuro exam remained stable.  Blood pressures have remained stable overnight.  IV fluids and Cardene drip discontinued.  Patient awaiting decision from CIR and bed availability.  Will likely be discharged to CIR.  Will  resume aspirin 81 mg daily prior to discharge.  PLAN  06/25/2017: Continue Statin HOLD ASA 81 mg for now- will likely resume at discharge Frequent neuro checks Telemetry monitoring PT/OT/SLP Consult PM & Rehab Ongoing aggressive stroke risk factor management Patient counseled to be compliant with his statin medication Follow up with [facility] in 6 weeks  HX OF STROKES: 20 years ago - No residual deficits  MEDICAL ISSUES: Anemia- Stable, Likely of chronic disease Hgb 10.7, Hct 32.9 today 12/14  HYPERTENSION: Stable SBP goal less than 180/90  Long term BP goal normotensive. Restarted home B/P medications today 12/13 Home Meds: Cardura, Micardis  HYPERLIPIDEMIA:    Component Value Date/Time   CHOL 140 02/01/2017 0852   TRIG 140.0 02/01/2017 0852   HDL 50.20 02/01/2017 0852   CHOLHDL 3 02/01/2017 0852   VLDL 28.0 02/01/2017 0852   LDLCALC 62 02/01/2017 0852  Home Meds:  Lipitor 10 mg LDL  goal < 70 Continued on  Lipitor to 10 mg daily Continue statin at discharge  DIABETES: Lab Results  Component Value Date   HGBA1C 7.2 (H) 05/04/2017  HgbA1c goal < 7.0 Currently on: Novolog Continue CBG monitoring and SSI to maintain glucose 140-180 mg/dl DM education   4 mm anterior communicating aneurysm asymptomatic Outpatient neurology surveillance  Hx of Frequent Falls over the past 2 years Outpatient neurology workup  Other Active Problems: Active Problems:   Intracranial hematoma following injury ([place])   ICH (intracerebral hemorrhage) [facility])  Hospital day # 2 VTE prophylaxis: SCD's  Diet : Diet Carb Modified Fluid consistency: Thin; Room service appropriate? Yes   FAMILY UPDATES: family at bedside  TEAM UPDATES: Garvin Fila, MD     Prior Home Stroke Medications:  aspirin 81 mg daily and Lipitor 10 mg  Discharge Stroke Meds:  Please discharge patient on ASA 81 mg and Lipitor 10 mg   Disposition: CIR Therapy Recs:  CIR Home Equipment:   PENDING Follow Up:  Follow-up Information    Garvin Fila, MD. Schedule an appointment as soon as possible for a visit in 6 week(s).   Specialties:  Neurology, Radiology Contact information: [address] [place] [address] [phone]          Janith Lima, MD -PCP Follow up in 1-2 weeks  Renie O[facility] Stroke Neurology Team 06/25/2017 12:23 PM I have personally examined this patient, reviewed notes, independently viewed imaging studies, participated in medical decision making and plan of care.ROS completed by me personally and pertinent positives fully documented  I have made any additions or clarifications directly to the above note. Agree with note above. [place] floor bed today and to inpatient rehabilitation over the next few days when bed available. Discussed with patient and wife and answered questions. Greater than  50% time during this 25 minute visit was spent on counseling and coordination of care about his intracranial hemorrhage and answering questions  Antony Contras, [place] Pager: [phone] 06/25/2017 2:29 PM   To contact Stroke Continuity provider, please refer to [email or web address] After hours, contact General Neurology

## 2017-06-25 NOTE — Progress Notes (Signed)
I met with pt at bedside to discuss goals and expectations of an inpt rehab admit. I received his supervisor's number and I will contact him to clarify workers compensation contact. I await progress functionally over the weekend and contact with workers Comp to determine final rehab venue. I will follow up on Monday. [phone]

## 2017-06-25 NOTE — Progress Notes (Signed)
Physical Therapy Treatment Patient Details Name: Frank [name] MRN: [id number] DOB: [birth year] Today's Date: 06/25/2017    History of Present Illness 76 y.o. male with a history of diabetes, hypertension, hyperlipidemia who presents after a fall with intraparenchymal hemorrhage in the right posterior parietal region.     PT Comments    Pt progresses towards PT goals today, ambulating 100-ft with RW and mod-assist +1. Pt is cooperative and pleasant to work with. Pt's left lateral lean seems to have slightly improved since last PT session with the use of RW. Pt is accepting of his lack of awareness of his position in space. Pt's lack of proprioception of LUE and LLE persists. Mirror was utilized during [employer] today. Current plan for CIR remains appropriate. PT will continue to follow acutely in order to improve upon deficits and promote safe mobility while in hospital setting.    Follow Up Recommendations  CIR;Supervision/Assistance - 24 hour     Equipment Recommendations  Rolling walker with 5" wheels    Recommendations for Other Services       Precautions / Restrictions Precautions Precautions: Fall Precaution Comments: left bias Restrictions Weight Bearing Restrictions: No    Mobility  Bed Mobility               General bed mobility comments: Pt OOB in chair upon PT arrival  Transfers Overall transfer level: Needs assistance Equipment used: Rolling walker (2 wheeled) Transfers: Sit to/from Stand Sit to Stand: Mod assist         General transfer comment: Pt continues to lean torwards left when standing up from chair. x1 from chair. Pt in chair post-ambulation.   Ambulation/Gait Ambulation/Gait assistance: Mod assist Ambulation Distance (Feet): 100 Feet Assistive device: Rolling walker (2 wheeled) Gait Pattern/deviations: Step-to pattern;Decreased weight shift to right;Drifts right/left;Narrow base of support;Decreased step length - right;Decreased  step length - left Gait velocity: decreased   General Gait Details: Pt leans to the left and requires VCs to shift weight back towards center. Pt leads with RLE and steps-to with LLE. Pt's LUE shifts to an under-hand grip on RW during gait and pt is unable to determine that his LUE hand position is different from RUE.    Stairs            Wheelchair Mobility    Modified Rankin (Stroke Patients Only) Modified Rankin (Stroke Patients Only) Pre-Morbid Rankin Score: No significant disability Modified Rankin: Moderately severe disability     Balance Overall balance assessment: Needs assistance Sitting-balance support: Single extremity supported;Feet supported Sitting balance-Leahy Scale: Poor Sitting balance - Comments: pt leans towards left and rests on LUE Postural control: Left lateral lean   Standing balance-Leahy Scale: Poor Standing balance comment: Pt requiring RW and min-mod assist support from PT in order to maintain standing balance due to left lateral lean.                             Cognition Arousal/Alertness: Awake/alert Behavior During Therapy: WFL for tasks assessed/performed                             Safety/Judgement: Decreased awareness of safety   Problem Solving: Requires verbal cues General Comments: Pt acknowledges that "they tell me I'm leaning to the left but I don't feel it". Pt accepts that he is unaware of when he is leaning to his left.  Exercises      General Comments General comments (skin integrity, edema, etc.): Utilized mirror during PT session in order for pt to see his left lean.       Pertinent Vitals/Pain Pain Assessment: No/denies pain    Home Living                      Prior Function            PT Goals (current goals can now be found in the care plan section) Progress towards PT goals: Progressing toward goals    Frequency    Min 4X/week      PT Plan Current plan remains  appropriate    Co-evaluation              AM-PAC PT "6 Clicks" Daily Activity  Outcome Measure  Difficulty turning over in bed (including adjusting bedclothes, sheets and blankets)?: Unable Difficulty moving from lying on back to sitting on the side of the bed? : Unable Difficulty sitting down on and standing up from a chair with arms (e.g., wheelchair, bedside commode, etc,.)?: Unable Help needed moving to and from a bed to chair (including a wheelchair)?: A Lot Help needed walking in hospital room?: A Lot Help needed climbing 3-5 steps with a railing? : A Lot 6 Click Score: 9    End of Session Equipment Utilized During Treatment: Gait belt Activity Tolerance: Patient tolerated treatment well Patient left: in chair;with chair alarm set;with call bell/phone within reach Nurse Communication: Mobility status PT Visit Diagnosis: Other abnormalities of gait and mobility (R26.89);Unsteadiness on feet (R26.81);Repeated falls (R29.6)     Time: 1540-0867 PT Time Calculation (min) (ACUTE ONLY): 33 min  Charges:  $Gait Training: 8-22 mins $Neuromuscular Re-education: 8-22 mins                    G Codes:       Judee Clara, SPT   Judee Clara 06/25/2017, 12:56 PM

## 2017-06-25 NOTE — Plan of Care (Signed)
Pt progressing well.  

## 2017-06-26 ENCOUNTER — Inpatient Hospital Stay (HOSPITAL_COMMUNITY): Payer: Worker's Compensation

## 2017-06-26 DIAGNOSIS — I159 Secondary hypertension, unspecified: Secondary | ICD-10-CM

## 2017-06-26 DIAGNOSIS — G4733 Obstructive sleep apnea (adult) (pediatric): Secondary | ICD-10-CM

## 2017-06-26 DIAGNOSIS — E119 Type 2 diabetes mellitus without complications: Secondary | ICD-10-CM

## 2017-06-26 DIAGNOSIS — I639 Cerebral infarction, unspecified: Secondary | ICD-10-CM

## 2017-06-26 DIAGNOSIS — Z9989 Dependence on other enabling machines and devices: Secondary | ICD-10-CM

## 2017-06-26 LAB — GLUCOSE, CAPILLARY
Glucose-Capillary: 181 mg/dL — ABNORMAL HIGH (ref 65–99)
Glucose-Capillary: 233 mg/dL — ABNORMAL HIGH (ref 65–99)
Glucose-Capillary: 164 mg/dL — ABNORMAL HIGH (ref 65–99)
Glucose-Capillary: 200 mg/dL — ABNORMAL HIGH (ref 65–99)

## 2017-06-26 LAB — LIPID PANEL
Cholesterol: 102 mg/dL (ref 0–200)
HDL: 42 mg/dL (ref >40)
LDL Cholesterol: 42 mg/dL (ref 0–99)
Total CHOL/HDL Ratio: 2.4 RATIO
Triglycerides: 88 mg/dL (ref <150)
VLDL: 18 mg/dL (ref 0–40)

## 2017-06-26 LAB — BASIC METABOLIC PANEL
Anion gap: 8 (ref 5–15)
BUN: 27 mg/dL — ABNORMAL HIGH (ref 6–20)
CO2: 27 mmol/L (ref 22–32)
Calcium: 8.7 mg/dL — ABNORMAL LOW (ref 8.9–10.3)
Chloride: 100 mmol/L — ABNORMAL LOW (ref 101–111)
Creatinine, Ser: 1.17 mg/dL (ref 0.61–1.24)
GFR calc Af Amer: >60 mL/min (ref >60)
GFR calc non Af Amer: 58 mL/min — ABNORMAL LOW (ref >60)
Glucose, Bld: 168 mg/dL — ABNORMAL HIGH (ref 65–99)
Potassium: 4.1 mmol/L (ref 3.5–5.1)
Sodium: 135 mmol/L (ref 135–145)

## 2017-06-26 LAB — CBC
HCT: 32.6 % — ABNORMAL LOW (ref 39.0–52.0)
Hemoglobin: 10.3 g/dL — ABNORMAL LOW (ref 13.0–17.0)
MCH: 26.6 pg (ref 26.0–34.0)
MCHC: 31.6 g/dL (ref 30.0–36.0)
MCV: 84.2 fL (ref 78.0–100.0)
Platelets: 174 10*3/uL (ref 150–400)
RBC: 3.87 MIL/uL — ABNORMAL LOW (ref 4.22–5.81)
RDW: 13.4 % (ref 11.5–15.5)
WBC: 7.3 10*3/uL (ref 4.0–10.5)

## 2017-06-26 LAB — VITAMIN B12: Vitamin B-12: 386 pg/mL (ref 180–914)

## 2017-06-26 NOTE — Plan of Care (Signed)
  Education: Knowledge of General Education information will improve 06/26/2017 2331 - Progressing by Marcos Eke, RN   Health Behavior/Discharge Planning: Ability to manage health-related needs will improve 06/26/2017 2331 - Progressing by Marcos Eke, RN   Clinical Measurements: Ability to maintain clinical measurements within normal limits will improve 06/26/2017 2331 - Progressing by Marcos Eke, RN   Clinical Measurements: Will remain free from infection 06/26/2017 2331 - Progressing by Marcos Eke, RN

## 2017-06-26 NOTE — Progress Notes (Signed)
NEUROHOSPITALISTS STROKE TEAM - DAILY PROGRESS NOTE   SUBJECTIVE (INTERVAL HISTORY) Son and daughter are at the bedside. Patient is found laying in bed in NAD. Just had CUS which was unremarkable.  Still complains of right clavicle pain and sacral region pain post fall.  He had a recent right clavicle fracture which was told to be healed 2 weeks ago.  However, after the fall, he started to have right clavicle pain again.  Pending CIR.  OBJECTIVE Lab Results: CBC:  Recent Labs  Lab 06/23/17 1018 06/25/17 0434 06/26/17 0230  WBC 6.7 7.4 7.3  HGB 10.5* 10.7* 10.3*  HCT 33.0* 32.9* 32.6*  MCV 85.9 83.7 84.2  PLT 150 146* 174   BMP: Recent Labs  Lab 06/23/17 1018 06/25/17 0434 06/26/17 0230  NA 136 135 135  K 5.0 4.0 4.1  CL 103 101 100*  CO2 25 27 27   GLUCOSE 216* 193* 168*  BUN 36* 19 27*  CREATININE 1.22 1.12 1.17  CALCIUM 9.2 9.0 8.7*   Cardiac Enzymes:  Recent Labs  Lab 06/23/17 1235  TROPONINI <0.03   Coagulation Studies:  Recent Labs    06/23/17 1235  APTT 24  INR 0.94   Urinalysis:  Recent Labs  Lab 06/23/17 1150  COLORURINE YELLOW  APPEARANCEUR CLEAR  LABSPEC 1.025  PHURINE 6.0  GLUCOSEU NEGATIVE  HGBUR TRACE*  BILIRUBINUR NEGATIVE  KETONESUR 15*  PROTEINUR 30*  NITRITE NEGATIVE  LEUKOCYTESUR NEGATIVE   PHYSICAL EXAM Temp:  [97.7 F (36.5 C)-98.8 F (37.1 C)] 97.7 F (36.5 C) (12/15 0947) Pulse Rate:  [69-72] 70 (12/15 0947) Resp:  [14-18] 16 (12/15 0947) BP: (108-177)/(50-73) 108/50 (12/15 0947) SpO2:  [93 %-98 %] 97 % (12/15 0947) General - Well nourished, well developed, in no apparent distress Respiratory - Lungs clear bilaterally. No wheezing. Cardiovascular - Regular rate and rhythm  Neuro: Mental Status: Patient is awake, alert, oriented to person, place, month, year, and situation. Patient is able to give a clear and coherent history. No signs of aphasia or  neglect Cranial Nerves: II: Visual Fields are full. Pupils are equal, round, and reactive to light.   III,IV, VI: EOMI without ptosis or diploplia.  V: Facial sensation is symmetric to temperature VII: Facial movement is symmetric.  VIII: hearing is intact to voice X: Uvula elevates symmetrically XI: Shoulder shrug is symmetric. XII: tongue is midline without atrophy or fasciculations.  Motor: Tone is normal. Bulk is normal. 5/5 strength was present in all four extremities. Very mild Left sided weakness noted with fine motor movements.  Orbits right over left upper extremity.  Minimum weakness of left grip Sensory: Sensation is symmetric to light touch and temperature in the arms and legs. Cerebellar: FNF  intact bilaterally  IMAGING: I have personally reviewed the radiological images below and agree with the radiology interpretations.  Ct Head & CT Cervical Spine Wo Contrast 06/23/2017 IMPRESSION: Area of hemorrhage in the posterior right parietal lobe measuring 3.2 x 1.8 cm. No significant mass effect or midline shift. Chronic sinusitis changes. Cervical spondylosis.  No acute findings in the cervical spine.   MRI/ MRA Head & Brain Wo Contrast Result Date: 06/24/2017 IMPRESSION: 1. 3.7 cm right parietal hemorrhage with unchanged mild surrounding edema and no mass effect. No surrounding infarct, underlying mass, or definite vascular malformation identified. No chronic hemorrhages to suggest cerebral amyloid angiopathy. 2. Mild chronic small vessel ischemic disease. 3. Patent circle of Willis without proximal branch occlusion or significant stenosis. 4. 4 mm anterior communicating  aneurysm. Electronically Signed   By: Logan Bores M.D.   On: 06/24/2017 11:41   Echocardiogram:     Study Conclusions - Left ventricle: The cavity size was normal. There was mild   concentric hypertrophy. Systolic function was normal. The   estimated ejection fraction was in the range of 55% to 60%. There    was an increased relative contribution of atrial contraction to   ventricular filling. Doppler parameters are consistent with   abnormal left ventricular relaxation (grade 1 diastolic   dysfunction). - Aortic valve: Mildly calcified leaflets. There was trivial   regurgitation. - Mitral valve: Calcified annulus. Valve area by pressure   half-time: 2 cm^2. - Left atrium: The atrium was mildly dilated. - Pulmonary arteries: Systolic pressure could not be accurately   estimated       Carotid Dopplers 06/26/2017 1-39% ICA stenosis. Vertebral artery flow is antegrade.   X-ray right clavicle and sacral/coccyx -pending     ASSESSMENT: Frank [name] is a 76 y.o. male with PMH of HTN, HLD, DM and remote Hx of CVA who presents with an intra-parenchymal hematoma on CT head after a fall on the ice.    ICH: 3.7 cm right parietal hemorrhage with unchanged mild surrounding edema and no mass effect  Suspected Etiology: Indeterminate, likely posttraumatic, possible HTN hemorrhage contributed, patient had elevated blood pressure on admission.   Resultant Symptoms: Minimal left-sided weakness noted on today's exam.  Admission symptoms of dizziness, slurred speech, headache-all resolved at this time Stroke Risk Factors: diabetes mellitus, hyperlipidemia and hypertension Other Stroke Risk Factors: Advanced age, Hx stroke, Obstructive sleep apnea, on CPAP at home  Outstanding Stroke Work-up Studies:    Workup completed  06/24/2017: Patient's neuro exam is stable with minimal left-sided weakness noted.  He denies headache.  Hemorrhage most likely posttraumatic due to patient falling on the ice backwards and hitting his head.  Patient's blood pressures have remained stable.  Home blood pressure medications have been restarted.  Patient will be monitored overnight.  He will be evaluated for possible CIR admission.  His family explains an approximate 2-year history of stumbling and unsteadiness  with frequent falling.  Patient states during these episodes he does not feel dizziness or experience a headache.  He states he just feels off balance. He has had no workup for these symptoms.  He has presented with likely traumatic intracerebral hemorrhage.  He needs close neurological monitoring and strict blood pressure control.  MRI has ruled out any underlying vascular or structural lesions.  Long discussion at the bedside with the patient, wife and daughter and answered questions.  Mobilize out of bed and therapy consults  06/25/2017: Patient's neuro exam remained stable.  Blood pressures have remained stable overnight.  IV fluids and Cardene drip discontinued.  Patient awaiting decision from CIR and bed availability.  Will likely be discharged to CIR.  Will resume aspirin 81 mg daily prior to discharge.  PLAN  06/26/2017: Continue Statin HOLD ASA 81 mg for now- will likely resume at discharge Frequent neuro checks Telemetry monitoring PT/OT/SLP -> CIR recommended. Consult PM & Rehab Ongoing aggressive stroke risk factor management Patient counseled to be compliant with his statin medication Follow up with [facility] in 6 weeks  HX OF STROKES: 20 years ago - No residual deficits  MEDICAL ISSUES: Anemia- Stable, Likely of chronic disease Hgb 10.7, Hct 32.9 today 12/14  HYPERTENSION: Stable SBP goal less than 180/90  Long term BP goal normotensive. Restarted  home B/P medications today 12/13 Home Meds: Cardura, Micardis  HYPERLIPIDEMIA:    Component Value Date/Time   CHOL 102 06/26/2017 0230   TRIG 88 06/26/2017 0230   HDL 42 06/26/2017 0230   CHOLHDL 2.4 06/26/2017 0230   VLDL 18 06/26/2017 0230   LDLCALC 42 06/26/2017 0230  Home Meds:  Lipitor 10 mg LDL  goal < 70 Continued on  Lipitor to 10 mg daily Continue statin at discharge  DIABETES: Lab Results  Component Value Date   HGBA1C 7.2 (H) 05/04/2017  HgbA1c goal < 7.0 Currently on:  Novolog Continue CBG monitoring and SSI to maintain glucose 140-180 mg/dl DM education   4 mm anterior communicating aneurysm asymptomatic Outpatient neurology surveillance  Hx of Frequent Falls over the past 2 years Outpatient neurology workup  Other Active Problems: Active Problems:   Intracranial hematoma following injury ([place])   [place] (intracerebral hemorrhage) [facility])  Hospital day # 3 VTE prophylaxis: SCD's  Diet : Diet Carb Modified Fluid consistency: Thin; Room service appropriate? Yes   FAMILY UPDATES: family at bedside  TEAM UPDATES: Frank Hawking, Frank [name]     Prior Home Stroke Medications:  aspirin 81 mg daily and Lipitor 10 mg  Discharge Stroke Meds:  Please discharge patient on ASA 81 mg and Lipitor 10 mg   Disposition: CIR Therapy Recs:  CIR Home Equipment:  PENDING Follow Up:  Follow-up Information    Marcial Pacas, Frank [name]. Schedule an appointment as soon as possible for a visit in 6 week(s).   Specialty:  Neurology Contact information: [address] [place] [address] [phone]          Janith Lima, Frank [name] -PCP Follow up in 1-2 weeks  ATTENDING NOTE: I reviewed above note and agree with the assessment and plan. I have made any additions or clarifications directly to the above note. Pt was seen and examined.   76 year old male with history of diabetes, hypertension, hyperlipidemia, OSA on CPAP admitted after fall on ice.  However, CT showed right paramedian parietal ICH.  Not sure whether this was traumatic ICH versus ICH related to fall.  MRI confirmed ICH, but no other structural lesion or CAA.  MRA head negative.  Carotid Doppler unremarkable.  EF 55-60% A1c 7.2, and LDL 42.  B12 398.  BP still fluctuating, but getting better.  On Lipitor, doxazosin and Avapro.  Pending CIR.  Complaint of right clavicle and sacral region pain after fall, pending x-ray.  Frank Hawking, MD PhD Stroke Neurology 06/26/2017 4:20 PM      To contact Stroke Continuity  provider, please refer to [email or web address] After hours, contact General Neurology

## 2017-06-26 NOTE — Progress Notes (Signed)
OFF THE FLOOR-VASCULAR

## 2017-06-26 NOTE — Progress Notes (Signed)
VASCULAR LAB PRELIMINARY  PRELIMINARY  PRELIMINARY  PRELIMINARY   Carotid duplex completed.    Preliminary report:  1-39% ICA stenosis. Vertebral artery flow is antegrade.   Malorie Bigford, RVT 06/26/2017, 11:28 AM

## 2017-06-26 NOTE — Progress Notes (Signed)
Patient's daughter asked if RN can asked doctor to do an Xray of his "butt". She states "this is workman's comp and since he fell on his butt, we are worried, he said it hurts" She also expressed concern about his "clavivle". She states can the doctor scan him, he says it hurts"  MD notified

## 2017-06-27 DIAGNOSIS — E1159 Type 2 diabetes mellitus with other circulatory complications: Secondary | ICD-10-CM

## 2017-06-27 LAB — CBC
HCT: 33.3 % — ABNORMAL LOW (ref 39.0–52.0)
Hemoglobin: 10.4 g/dL — ABNORMAL LOW (ref 13.0–17.0)
MCH: 26.4 pg (ref 26.0–34.0)
MCHC: 31.2 g/dL (ref 30.0–36.0)
MCV: 84.5 fL (ref 78.0–100.0)
Platelets: 180 10*3/uL (ref 150–400)
RBC: 3.94 MIL/uL — ABNORMAL LOW (ref 4.22–5.81)
RDW: 13.1 % (ref 11.5–15.5)
WBC: 6.8 10*3/uL (ref 4.0–10.5)

## 2017-06-27 LAB — BASIC METABOLIC PANEL
Anion gap: 9 (ref 5–15)
BUN: 25 mg/dL — ABNORMAL HIGH (ref 6–20)
CO2: 28 mmol/L (ref 22–32)
Calcium: 9 mg/dL (ref 8.9–10.3)
Chloride: 99 mmol/L — ABNORMAL LOW (ref 101–111)
Creatinine, Ser: 1.17 mg/dL (ref 0.61–1.24)
GFR calc Af Amer: >60 mL/min (ref >60)
GFR calc non Af Amer: 58 mL/min — ABNORMAL LOW (ref >60)
Glucose, Bld: 180 mg/dL — ABNORMAL HIGH (ref 65–99)
Potassium: 4.1 mmol/L (ref 3.5–5.1)
Sodium: 136 mmol/L (ref 135–145)

## 2017-06-27 LAB — GLUCOSE, CAPILLARY
Glucose-Capillary: 191 mg/dL — ABNORMAL HIGH (ref 65–99)
Glucose-Capillary: 248 mg/dL — ABNORMAL HIGH (ref 65–99)
Glucose-Capillary: 181 mg/dL — ABNORMAL HIGH (ref 65–99)
Glucose-Capillary: 230 mg/dL — ABNORMAL HIGH (ref 65–99)

## 2017-06-27 MED ORDER — ONDANSETRON HCL 4 MG/2ML IJ SOLN
4.0000 mg | Freq: Four times a day (QID) | INTRAMUSCULAR | Status: DC | PRN
  Administered 2017-06-27 – 2017-06-28 (×2): 4 mg via INTRAVENOUS
  Filled 2017-06-27 (×2): qty 2

## 2017-06-27 MED ORDER — IRBESARTAN 150 MG PO TABS
150.0000 mg | ORAL_TABLET | Freq: Two times a day (BID) | ORAL | Status: DC
  Administered 2017-06-27 – 2017-06-28 (×2): 150 mg via ORAL
  Filled 2017-06-27 (×3): qty 1

## 2017-06-27 MED ORDER — AMLODIPINE BESYLATE 10 MG PO TABS
10.0000 mg | ORAL_TABLET | Freq: Every day | ORAL | Status: DC
  Administered 2017-06-27 – 2017-06-28 (×2): 10 mg via ORAL
  Filled 2017-06-27 (×2): qty 1

## 2017-06-27 MED ORDER — LABETALOL HCL 5 MG/ML IV SOLN
10.0000 mg | INTRAVENOUS | Status: DC | PRN
  Administered 2017-06-27: 10 mg via INTRAVENOUS
  Filled 2017-06-27: qty 4

## 2017-06-27 NOTE — Progress Notes (Signed)
NEUROHOSPITALISTS STROKE TEAM - DAILY PROGRESS NOTE   SUBJECTIVE (INTERVAL HISTORY) Daughter is at the bedside. Had X-ray of right clavicle and sacrum/coccyx showed nonunion of right clavicle and possible nondisplaced fracture at sacrum/coccyx bone. Will have orthopedic consult. No other complains. Pending CIR.   OBJECTIVE Lab Results: CBC:  Recent Labs  Lab 06/25/17 0434 06/26/17 0230 06/27/17 0412  WBC 7.4 7.3 6.8  HGB 10.7* 10.3* 10.4*  HCT 32.9* 32.6* 33.3*  MCV 83.7 84.2 84.5  PLT 146* 174 180   BMP: Recent Labs  Lab 06/23/17 1018 06/25/17 0434 06/26/17 0230 06/27/17 0412  NA 136 135 135 136  K 5.0 4.0 4.1 4.1  CL 103 101 100* 99*  CO2 25 27 27 28   GLUCOSE 216* 193* 168* 180*  BUN 36* 19 27* 25*  CREATININE 1.22 1.12 1.17 1.17  CALCIUM 9.2 9.0 8.7* 9.0   Cardiac Enzymes:  Recent Labs  Lab 06/23/17 1235  TROPONINI <0.03   Coagulation Studies:  No results for input(s): APTT, INR in the last 72 hours. Urinalysis:  Recent Labs  Lab 06/23/17 1150  COLORURINE YELLOW  APPEARANCEUR CLEAR  LABSPEC 1.025  PHURINE 6.0  GLUCOSEU NEGATIVE  HGBUR TRACE*  BILIRUBINUR NEGATIVE  KETONESUR 15*  PROTEINUR 30*  NITRITE NEGATIVE  LEUKOCYTESUR NEGATIVE   PHYSICAL EXAM Temp:  [97.6 F (36.4 C)-97.9 F (36.6 C)] 97.9 F (36.6 C) (12/16 0115) Pulse Rate:  [70-79] 79 (12/16 0115) Resp:  [16-20] 20 (12/16 0115) BP: (108-174)/(50-76) 174/74 (12/16 0115) SpO2:  [95 %-98 %] 95 % (12/16 0115) General - Well nourished, well developed, in no apparent distress Respiratory - Lungs clear bilaterally. No wheezing. Cardiovascular - Regular rate and rhythm  Neuro: Mental Status: Patient is awake, alert, oriented to person, place, month, year, and situation. Patient is able to give a clear and coherent history. No signs of aphasia or neglect Cranial Nerves: II: Visual Fields are full. Pupils are equal, round, and  reactive to light.   III,IV, VI: EOMI without ptosis or diploplia.  V: Facial sensation is symmetric to temperature VII: Facial movement is symmetric.  VIII: hearing is intact to voice X: Uvula elevates symmetrically XI: Shoulder shrug is symmetric. XII: tongue is midline without atrophy or fasciculations.  Motor: Tone is normal. Bulk is normal. 5/5 strength was present in all four extremities except very mild Left sided weakness more with fine motor movements.  Orbits right over left upper extremity.  Minimum weakness of left grip Sensory: Sensation is symmetric to light touch and temperature in the arms and legs. Cerebellar: FNF  intact bilaterally  IMAGING: I have personally reviewed the radiological images below and agree with the radiology interpretations.  Ct Head & CT Cervical Spine Wo Contrast 06/23/2017 IMPRESSION: Area of hemorrhage in the posterior right parietal lobe measuring 3.2 x 1.8 cm. No significant mass effect or midline shift. Chronic sinusitis changes. Cervical spondylosis.  No acute findings in the cervical spine.   MRI/ MRA Head & Brain Wo Contrast Result Date: 06/24/2017 IMPRESSION: 1. 3.7 cm right parietal hemorrhage with unchanged mild surrounding edema and no mass effect. No surrounding infarct, underlying mass, or definite vascular malformation identified. No chronic hemorrhages to suggest cerebral amyloid angiopathy. 2. Mild chronic small vessel ischemic disease. 3. Patent circle of Willis without proximal branch occlusion or significant stenosis. 4. 4 mm anterior communicating aneurysm. Electronically Signed   By: Logan Bores M.D.   On: 06/24/2017 11:41   Echocardiogram:     Study Conclusions - Left  ventricle: The cavity size was normal. There was mild   concentric hypertrophy. Systolic function was normal. The   estimated ejection fraction was in the range of 55% to 60%. There   was an increased relative contribution of atrial contraction to    ventricular filling. Doppler parameters are consistent with   abnormal left ventricular relaxation (grade 1 diastolic   dysfunction). - Aortic valve: Mildly calcified leaflets. There was trivial   regurgitation. - Mitral valve: Calcified annulus. Valve area by pressure   half-time: 2 cm^2. - Left atrium: The atrium was mildly dilated. - Pulmonary arteries: Systolic pressure could not be accurately   estimated       Carotid Dopplers 06/26/2017 1-39% ICA stenosis. Vertebral artery flow is antegrade.   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 This is new since 03/18/2016   DG Sacral / Coccyx  06/26/2017 Question upper coccygeal nondisplaced fracture.   ASSESSMENT: Frank [name] is a 76 y.o. male with PMH of HTN, HLD, DM and remote Hx of CVA who presents with an intra-parenchymal hematoma on CT head after a fall on the ice.    ICH: 3.7 cm right parietal hemorrhage with unchanged mild surrounding edema and no mass effect  Suspected Etiology: Indeterminate, likely posttraumatic, possible HTN hemorrhage contributed, patient had elevated blood pressure on admission.   Resultant Symptoms: Minimal left-sided weakness noted on today's exam.  Admission symptoms of dizziness, slurred speech, headache-all resolved at this time Stroke Risk Factors: diabetes mellitus, hyperlipidemia and hypertension Other Stroke Risk Factors: Advanced age, Hx stroke, Obstructive sleep apnea, on CPAP at home  Outstanding Stroke Work-up Studies:    Workup completed  06/24/2017: Patient's neuro exam is stable with minimal left-sided weakness noted.  He denies headache.  Hemorrhage most likely posttraumatic due to patient falling on the ice backwards and hitting his head.  Patient's blood pressures have remained stable.  Home blood pressure medications have been restarted.  Patient will be monitored overnight.  He will be  evaluated for possible CIR admission.  His family explains an approximate 2-year history of stumbling and unsteadiness with frequent falling.  Patient states during these episodes he does not feel dizziness or experience a headache.  He states he just feels off balance. He has had no workup for these symptoms.  He has presented with likely traumatic intracerebral hemorrhage.  He needs close neurological monitoring and strict blood pressure control.  MRI has ruled out any underlying vascular or structural lesions.  Long discussion at the bedside with the patient, wife and daughter and answered questions.  Mobilize out of bed and therapy consults  06/25/2017: Patient's neuro exam remained stable.  Blood pressures have remained stable overnight.  IV fluids and Cardene drip discontinued.  Patient awaiting decision from CIR and bed availability.  Will likely be discharged to CIR.  Will resume aspirin 81 mg daily prior to discharge.  PLAN  06/27/2017: Continue Statin HOLD ASA 81 mg for now- will likely resume at discharge Frequent neuro checks Telemetry monitoring PT/OT/SLP -> CIR recommended. Consult PM & Rehab Ongoing aggressive stroke risk factor management Patient counseled to be compliant with his statin medication Follow up with [facility] in 6 weeks  HX OF STROKES: 20 years ago - No residual deficits  MEDICAL ISSUES: Anemia- Stable, Likely of chronic disease Hgb 10.7, Hct 32.9 today 12/14  HYPERTENSION: Stable SBP goal less than 180/90  Long term BP goal  normotensive. Restarted home B/P medications today 12/13, add amlodipine today.  Home Meds: Cardura, Micardis  HYPERLIPIDEMIA:    Component Value Date/Time   CHOL 102 06/26/2017 0230   TRIG 88 06/26/2017 0230   HDL 42 06/26/2017 0230   CHOLHDL 2.4 06/26/2017 0230   VLDL 18 06/26/2017 0230   LDLCALC 42 06/26/2017 0230  Home Meds:  Lipitor 10 mg LDL  goal < 70 Continued on  Lipitor to 10 mg daily Continue  statin at discharge  DIABETES: Lab Results  Component Value Date   HGBA1C 7.2 (H) 05/04/2017  HgbA1c goal < 7.0 Currently on: Novolog Continue CBG monitoring and SSI to maintain glucose 140-180 mg/dl DM education   4 mm anterior communicating aneurysm asymptomatic Outpatient neurology surveillance  Hx of Frequent Falls over the past 2 years Outpatient neurology workup  Other Active Problems: Active Problems:   Intracranial hematoma following injury ([place])   ICH (intracerebral hemorrhage) ([place])    Possible Sacral and Rt Clavicle Fx Pt states he sees Dr Noemi Chapel for ortho. Dr Percell Miller on call - contacted -> he will see patient today.    Hospital day # 4 VTE prophylaxis: SCD's  Diet : Diet Carb Modified Fluid consistency: Thin; Room service appropriate? Yes   FAMILY UPDATES: family at bedside  TEAM UPDATES: Rosalin Hawking, MD     Prior Home Stroke Medications:  aspirin 81 mg daily and Lipitor 10 mg  Discharge Stroke Meds:  Please discharge patient on ASA 81 mg and Lipitor 10 mg   Disposition: CIR Therapy Recs:  CIR Home Equipment:  PENDING Follow Up:  Follow-up Information    Marcial Pacas, MD. Schedule an appointment as soon as possible for a visit in 6 week(s).   Specialty:  Neurology Contact information: [address] [place] [address] [phone]          Janith Lima, MD -PCP Follow up in 1-2 weeks  ATTENDING NOTE: I reviewed above note and agree with the assessment and plan. I have made any additions or clarifications directly to the above note. Pt was seen and examined.   76 year old male with history of diabetes, hypertension, hyperlipidemia, OSA on CPAP admitted after fall on ice.  However, CT showed right paramedian parietal ICH.  Not sure whether this was traumatic ICH versus ICH causing the fall.  MRI confirmed ICH, but no other structural lesion or CAA.  MRA head negative.  Carotid Doppler unremarkable.  EF 55-60% A1c 7.2, and LDL 42.   B12 398.  BP still at high end, will add amlodipine on top of avapro and doxazosin. Complaint of right clavicle and sacral region pain after fall -> x-rays done and ortho consulted. Pending CIR.   Rosalin Hawking, MD PhD Stroke Neurology 06/27/2017 1:00 PM   To contact Stroke Continuity provider, please refer to [email or web address] After hours, contact General Neurology

## 2017-06-27 NOTE — Consult Note (Signed)
I have reviewed xrays  Tentative plan is for conservative management   WBAT BLE Ok to use RUE as tolerated but sling for comfort ok if he desires.    Formal consult to follow     Ayiden Milliman, Ernesta Amble

## 2017-06-27 NOTE — Progress Notes (Signed)
Neuro MD notified of BP- 181/76

## 2017-06-28 ENCOUNTER — Encounter (HOSPITAL_COMMUNITY): Payer: Self-pay

## 2017-06-28 ENCOUNTER — Inpatient Hospital Stay (HOSPITAL_COMMUNITY)
Admission: RE | Admit: 2017-06-28 | Discharge: 2017-07-09 | DRG: 945 | Disposition: A | Discharge status: Home Health | Payer: Worker's Compensation | Source: Intra-hospital | Attending: Physical Medicine & Rehabilitation | Admitting: Physical Medicine & Rehabilitation

## 2017-06-28 ENCOUNTER — Other Ambulatory Visit: Payer: Self-pay

## 2017-06-28 DIAGNOSIS — F329 Major depressive disorder, single episode, unspecified: Secondary | ICD-10-CM | POA: Diagnosis present

## 2017-06-28 DIAGNOSIS — Z7984 Long term (current) use of oral hypoglycemic drugs: Secondary | ICD-10-CM

## 2017-06-28 DIAGNOSIS — M75101 Unspecified rotator cuff tear or rupture of right shoulder, not specified as traumatic: Secondary | ICD-10-CM | POA: Diagnosis not present

## 2017-06-28 DIAGNOSIS — R269 Unspecified abnormalities of gait and mobility: Secondary | ICD-10-CM | POA: Diagnosis present

## 2017-06-28 DIAGNOSIS — E1142 Type 2 diabetes mellitus with diabetic polyneuropathy: Secondary | ICD-10-CM

## 2017-06-28 DIAGNOSIS — Z803 Family history of malignant neoplasm of breast: Secondary | ICD-10-CM

## 2017-06-28 DIAGNOSIS — R7989 Other specified abnormal findings of blood chemistry: Secondary | ICD-10-CM | POA: Diagnosis not present

## 2017-06-28 DIAGNOSIS — N183 Chronic kidney disease, stage 3 unspecified: Secondary | ICD-10-CM

## 2017-06-28 DIAGNOSIS — Z8249 Family history of ischemic heart disease and other diseases of the circulatory system: Secondary | ICD-10-CM | POA: Diagnosis not present

## 2017-06-28 DIAGNOSIS — S06349D Traumatic hemorrhage of right cerebrum with loss of consciousness of unspecified duration, subsequent encounter: Secondary | ICD-10-CM | POA: Diagnosis not present

## 2017-06-28 DIAGNOSIS — S42031K Displaced fracture of lateral end of right clavicle, subsequent encounter for fracture with nonunion: Secondary | ICD-10-CM

## 2017-06-28 DIAGNOSIS — Z7982 Long term (current) use of aspirin: Secondary | ICD-10-CM

## 2017-06-28 DIAGNOSIS — E118 Type 2 diabetes mellitus with unspecified complications: Secondary | ICD-10-CM

## 2017-06-28 DIAGNOSIS — R296 Repeated falls: Secondary | ICD-10-CM | POA: Diagnosis not present

## 2017-06-28 DIAGNOSIS — N189 Chronic kidney disease, unspecified: Secondary | ICD-10-CM | POA: Diagnosis not present

## 2017-06-28 DIAGNOSIS — E871 Hypo-osmolality and hyponatremia: Secondary | ICD-10-CM | POA: Diagnosis not present

## 2017-06-28 DIAGNOSIS — F32A Depression, unspecified: Secondary | ICD-10-CM

## 2017-06-28 DIAGNOSIS — N182 Chronic kidney disease, stage 2 (mild): Secondary | ICD-10-CM | POA: Diagnosis not present

## 2017-06-28 DIAGNOSIS — Z823 Family history of stroke: Secondary | ICD-10-CM

## 2017-06-28 DIAGNOSIS — E785 Hyperlipidemia, unspecified: Secondary | ICD-10-CM

## 2017-06-28 DIAGNOSIS — N179 Acute kidney failure, unspecified: Secondary | ICD-10-CM | POA: Diagnosis not present

## 2017-06-28 DIAGNOSIS — N400 Enlarged prostate without lower urinary tract symptoms: Secondary | ICD-10-CM

## 2017-06-28 DIAGNOSIS — D62 Acute posthemorrhagic anemia: Secondary | ICD-10-CM | POA: Diagnosis not present

## 2017-06-28 DIAGNOSIS — W000XXD Fall on same level due to ice and snow, subsequent encounter: Secondary | ICD-10-CM | POA: Diagnosis present

## 2017-06-28 DIAGNOSIS — Z79899 Other long term (current) drug therapy: Secondary | ICD-10-CM | POA: Diagnosis not present

## 2017-06-28 DIAGNOSIS — S06309A Unspecified focal traumatic brain injury with loss of consciousness of unspecified duration, initial encounter: Secondary | ICD-10-CM | POA: Diagnosis present

## 2017-06-28 DIAGNOSIS — S062X0A Diffuse traumatic brain injury without loss of consciousness, initial encounter: Secondary | ICD-10-CM

## 2017-06-28 DIAGNOSIS — S06301S Unspecified focal traumatic brain injury with loss of consciousness of 30 minutes or less, sequela: Secondary | ICD-10-CM

## 2017-06-28 DIAGNOSIS — Z87891 Personal history of nicotine dependence: Secondary | ICD-10-CM | POA: Diagnosis not present

## 2017-06-28 DIAGNOSIS — M7989 Other specified soft tissue disorders: Secondary | ICD-10-CM | POA: Diagnosis not present

## 2017-06-28 DIAGNOSIS — I10 Essential (primary) hypertension: Secondary | ICD-10-CM | POA: Diagnosis not present

## 2017-06-28 DIAGNOSIS — I610 Nontraumatic intracerebral hemorrhage in hemisphere, subcortical: Secondary | ICD-10-CM

## 2017-06-28 DIAGNOSIS — I951 Orthostatic hypotension: Secondary | ICD-10-CM | POA: Diagnosis present

## 2017-06-28 DIAGNOSIS — I69398 Other sequelae of cerebral infarction: Secondary | ICD-10-CM | POA: Diagnosis not present

## 2017-06-28 DIAGNOSIS — G8194 Hemiplegia, unspecified affecting left nondominant side: Secondary | ICD-10-CM | POA: Diagnosis not present

## 2017-06-28 DIAGNOSIS — S0630AA Unspecified focal traumatic brain injury with loss of consciousness status unknown, initial encounter: Secondary | ICD-10-CM | POA: Diagnosis present

## 2017-06-28 DIAGNOSIS — Z8781 Personal history of (healed) traumatic fracture: Secondary | ICD-10-CM

## 2017-06-28 DIAGNOSIS — E119 Type 2 diabetes mellitus without complications: Secondary | ICD-10-CM | POA: Diagnosis not present

## 2017-06-28 LAB — BASIC METABOLIC PANEL
Anion gap: 8 (ref 5–15)
BUN: 24 mg/dL — ABNORMAL HIGH (ref 6–20)
CO2: 29 mmol/L (ref 22–32)
Calcium: 9.1 mg/dL (ref 8.9–10.3)
Chloride: 99 mmol/L — ABNORMAL LOW (ref 101–111)
Creatinine, Ser: 1.15 mg/dL (ref 0.61–1.24)
GFR calc Af Amer: >60 mL/min (ref >60)
GFR calc non Af Amer: 59 mL/min — ABNORMAL LOW (ref >60)
Glucose, Bld: 176 mg/dL — ABNORMAL HIGH (ref 65–99)
Potassium: 4.1 mmol/L (ref 3.5–5.1)
Sodium: 136 mmol/L (ref 135–145)

## 2017-06-28 LAB — GLUCOSE, CAPILLARY
Glucose-Capillary: 166 mg/dL — ABNORMAL HIGH (ref 65–99)
Glucose-Capillary: 198 mg/dL — ABNORMAL HIGH (ref 65–99)
Glucose-Capillary: 178 mg/dL — ABNORMAL HIGH (ref 65–99)
Glucose-Capillary: 217 mg/dL — ABNORMAL HIGH (ref 65–99)

## 2017-06-28 LAB — CBC
HCT: 33.1 % — ABNORMAL LOW (ref 39.0–52.0)
Hemoglobin: 10.4 g/dL — ABNORMAL LOW (ref 13.0–17.0)
MCH: 26.7 pg (ref 26.0–34.0)
MCHC: 31.4 g/dL (ref 30.0–36.0)
MCV: 84.9 fL (ref 78.0–100.0)
Platelets: 189 10*3/uL (ref 150–400)
RBC: 3.9 MIL/uL — ABNORMAL LOW (ref 4.22–5.81)
RDW: 13.2 % (ref 11.5–15.5)
WBC: 7.7 10*3/uL (ref 4.0–10.5)

## 2017-06-28 MED ORDER — FLUOXETINE HCL 20 MG PO CAPS
20.0000 mg | ORAL_CAPSULE | Freq: Every day | ORAL | Status: DC
  Administered 2017-06-29 – 2017-07-09 (×11): 20 mg via ORAL
  Filled 2017-06-28 (×11): qty 1

## 2017-06-28 MED ORDER — ACETAMINOPHEN 325 MG PO TABS
650.0000 mg | ORAL_TABLET | ORAL | Status: DC | PRN
  Administered 2017-06-29 – 2017-07-02 (×5): 650 mg via ORAL
  Filled 2017-06-28 (×5): qty 2

## 2017-06-28 MED ORDER — ONDANSETRON HCL 4 MG/2ML IJ SOLN: 4.0000 mg | Freq: Four times a day (QID) | INTRAMUSCULAR | Status: DC | PRN

## 2017-06-28 MED ORDER — ATORVASTATIN CALCIUM 10 MG PO TABS
10.0000 mg | ORAL_TABLET | Freq: Every day | ORAL | Status: DC
  Administered 2017-06-29 – 2017-07-09 (×11): 10 mg via ORAL
  Filled 2017-06-28 (×11): qty 1

## 2017-06-28 MED ORDER — INSULIN ASPART 100 UNIT/ML ~~LOC~~ SOLN
0.0000 [IU] | Freq: Three times a day (TID) | SUBCUTANEOUS | Status: DC
  Administered 2017-06-28: 3 [IU] via SUBCUTANEOUS
  Administered 2017-06-29: 8 [IU] via SUBCUTANEOUS
  Administered 2017-06-29: 3 [IU] via SUBCUTANEOUS
  Administered 2017-06-29: 2 [IU] via SUBCUTANEOUS
  Administered 2017-06-30 – 2017-07-01 (×4): 5 [IU] via SUBCUTANEOUS
  Administered 2017-07-01 – 2017-07-02 (×2): 3 [IU] via SUBCUTANEOUS
  Administered 2017-07-02: 2 [IU] via SUBCUTANEOUS
  Administered 2017-07-02 – 2017-07-04 (×4): 3 [IU] via SUBCUTANEOUS
  Administered 2017-07-04 (×2): 5 [IU] via SUBCUTANEOUS
  Administered 2017-07-05 (×2): 3 [IU] via SUBCUTANEOUS
  Administered 2017-07-06: 8 [IU] via SUBCUTANEOUS
  Administered 2017-07-06: 2 [IU] via SUBCUTANEOUS
  Administered 2017-07-07: 3 [IU] via SUBCUTANEOUS
  Administered 2017-07-07: 5 [IU] via SUBCUTANEOUS
  Administered 2017-07-08: 3 [IU] via SUBCUTANEOUS
  Administered 2017-07-08: 2 [IU] via SUBCUTANEOUS
  Administered 2017-07-08 – 2017-07-09 (×2): 3 [IU] via SUBCUTANEOUS

## 2017-06-28 MED ORDER — DOXAZOSIN MESYLATE 2 MG PO TABS
2.0000 mg | ORAL_TABLET | Freq: Every day | ORAL | Status: DC
  Administered 2017-06-28 – 2017-07-07 (×10): 2 mg via ORAL
  Filled 2017-06-28 (×10): qty 1

## 2017-06-28 MED ORDER — PANTOPRAZOLE SODIUM 40 MG PO TBEC
40.0000 mg | DELAYED_RELEASE_TABLET | Freq: Every day | ORAL | Status: DC
  Administered 2017-06-28 – 2017-07-08 (×12): 40 mg via ORAL
  Filled 2017-06-28 (×11): qty 1

## 2017-06-28 MED ORDER — ACETAMINOPHEN 650 MG RE SUPP: 650.0000 mg | RECTAL | Status: DC | PRN

## 2017-06-28 MED ORDER — AMLODIPINE BESYLATE 10 MG PO TABS
10.0000 mg | ORAL_TABLET | Freq: Every day | ORAL | Status: DC
  Administered 2017-06-29 – 2017-07-09 (×10): 10 mg via ORAL
  Filled 2017-06-28 (×12): qty 1

## 2017-06-28 MED ORDER — ACETAMINOPHEN 160 MG/5ML PO SOLN: 650.0000 mg | ORAL | Status: DC | PRN

## 2017-06-28 MED ORDER — IRBESARTAN 75 MG PO TABS
150.0000 mg | ORAL_TABLET | Freq: Two times a day (BID) | ORAL | Status: DC
  Administered 2017-06-28 – 2017-07-02 (×7): 150 mg via ORAL
  Filled 2017-06-28 (×9): qty 2

## 2017-06-28 MED ORDER — SORBITOL 70 % SOLN: 30.0000 mL | Freq: Every day | Status: DC | PRN

## 2017-06-28 MED ORDER — ENSURE ENLIVE PO LIQD
237.0000 mL | Freq: Two times a day (BID) | ORAL | Status: DC
  Administered 2017-06-28 – 2017-06-29 (×2): 237 mL via ORAL

## 2017-06-28 MED ORDER — OXYBUTYNIN CHLORIDE 5 MG PO TABS
5.0000 mg | ORAL_TABLET | Freq: Two times a day (BID) | ORAL | Status: DC
  Administered 2017-06-28 – 2017-07-09 (×23): 5 mg via ORAL
  Filled 2017-06-28 (×22): qty 1

## 2017-06-28 MED ORDER — SENNOSIDES-DOCUSATE SODIUM 8.6-50 MG PO TABS
1.0000 | ORAL_TABLET | Freq: Two times a day (BID) | ORAL | Status: DC
  Administered 2017-06-28 – 2017-07-09 (×22): 1 via ORAL
  Filled 2017-06-28 (×22): qty 1

## 2017-06-28 MED ORDER — ONDANSETRON HCL 4 MG PO TABS: 4.0000 mg | ORAL_TABLET | Freq: Four times a day (QID) | ORAL | Status: DC | PRN

## 2017-06-28 NOTE — Progress Notes (Signed)
Physical Therapy Treatment Patient Details Name: Frank [name] MRN: [id number] DOB: May 26, 1941 Today's Date: 06/28/2017    History of Present Illness 76 y.o. male with a history of diabetes, hypertension, hyperlipidemia who presents after a fall with intraparenchymal hemorrhage in the right posterior parietal region.     PT Comments    Pt progresses towards PT goals today, demonstrating ambulation with min assist and no AD. Pt continues to demonstrate spatial awareness deficits of LLE, however pt responds well to visual feedback using a mirror during ambulation, and is able to make corrections to foot position. Pt continues to drift left during ambulation and runs into objects on left x2. Nurse tech encouraged to walk with pt time permitting. Pt remains motivated and pleasant to work with. Current plan remains appropriate (CIR) as pt continues to make steady improvements and hopes to return to prior level of function (independent without assistive device). PT will follow acutely in order to improve mobility status and increase level of independence in hospital setting.    Follow Up Recommendations  CIR;Supervision/Assistance - 24 hour     Equipment Recommendations  Rolling walker with 5" wheels    Recommendations for Other Services       Precautions / Restrictions Precautions Precautions: Fall Restrictions Weight Bearing Restrictions: No    Mobility  Bed Mobility Overal bed mobility: Needs Assistance Bed Mobility: Rolling;Sidelying to Sit Rolling: Min assist Sidelying to sit: Mod assist;HOB elevated       General bed mobility comments: Pt uses bed railing to assist in bed mobility. Pt with nauesea upon sitting up.   Transfers Overall transfer level: Needs assistance Equipment used: Rolling walker (2 wheeled) Transfers: Sit to/from Stand Sit to Stand: Min assist         General transfer comment: VCs for hand placement. x1 from EOB. Pt in chair post-ambulation.    Ambulation/Gait Ambulation/Gait assistance: Min assist Ambulation Distance (Feet): 100 Feet Assistive device: None;Rolling walker (2 wheeled) Gait Pattern/deviations: Step-to pattern;Drifts right/left;Decreased step length - right;Decreased step length - left Gait velocity: decreased Gait velocity interpretation: Below normal speed for age/gender General Gait Details: RW was utilized at beginning of session, however pt with short steps and step-to gait. As pt previously without use of ADs, RW was set aside and pt was able to take larger steps with PT assist and VCs. Pt drifts towards left consistently during ambulation and requires VCs to move right. Pt has developed compensated right lateral trunk lean. Pt with left foot rotated outwards, requiring verbal and visual cues to correct (mirror). Pt staggers to left once with PT min assist to regain balance.   Stairs            Wheelchair Mobility    Modified Rankin (Stroke Patients Only) Modified Rankin (Stroke Patients Only) Pre-Morbid Rankin Score: No significant disability Modified Rankin: Moderately severe disability     Balance Overall balance assessment: Needs assistance Sitting-balance support: No upper extremity supported Sitting balance-Leahy Scale: Fair Sitting balance - Comments: Pt demonstrates sittin EOB with hands in lap without external support, however has slight posterior lean. Pt unable to shift weight without external support.    Standing balance support: No upper extremity supported;During functional activity Standing balance-Leahy Scale: Fair Standing balance comment: Pt tolerates standing without external support momentarily, however requires therapist assist or external support from surface in order to maintain balance with weight shifting.  Cognition Arousal/Alertness: Awake/alert Behavior During Therapy: WFL for tasks assessed/performed Overall Cognitive Status:  Within Functional Limits for tasks assessed                                        Exercises      General Comments General comments (skin integrity, edema, etc.): Pt responded well to visual feedback via use of mirror during standing and ambulation in order to correct postural abnormalities. Pt is able to correct positioning of left foot with verbal and visual feedback with increased time. Pt feeling nauseated pre-session and nurse notified to give meds. Meds recieved pre-ambulation and pt reports nausea subsided.        Pertinent Vitals/Pain Pain Assessment: No/denies pain    Home Living                      Prior Function            PT Goals (current goals can now be found in the care plan section) Progress towards PT goals: Progressing toward goals    Frequency    Min 4X/week      PT Plan Current plan remains appropriate    Co-evaluation              AM-PAC PT "6 Clicks" Daily Activity  Outcome Measure  Difficulty turning over in bed (including adjusting bedclothes, sheets and blankets)?: A Little Difficulty moving from lying on back to sitting on the side of the bed? : Unable Difficulty sitting down on and standing up from a chair with arms (e.g., wheelchair, bedside commode, etc,.)?: Unable Help needed moving to and from a bed to chair (including a wheelchair)?: A Little Help needed walking in hospital room?: A Little Help needed climbing 3-5 steps with a railing? : A Lot 6 Click Score: 13    End of Session Equipment Utilized During Treatment: Gait belt Activity Tolerance: Patient tolerated treatment well Patient left: in chair;with chair alarm set;with call bell/phone within reach Nurse Communication: Mobility status PT Visit Diagnosis: Other abnormalities of gait and mobility (R26.89);Unsteadiness on feet (R26.81);Repeated falls (R29.6)     Time: 8676-7209 PT Time Calculation (min) (ACUTE ONLY): 34 min  Charges:  $Gait  Training: 8-22 mins $Neuromuscular Re-education: 8-22 mins                    G Codes:       Judee Clara, SPT   Judee Clara 06/28/2017, 11:02 AM

## 2017-06-28 NOTE — Consult Note (Signed)
            [facility] CM Primary Care Navigator  06/28/2017  Frank [name] 1941-03-19 [id number]   Seen patient at the bedside to identify possible discharge needs.  Patient reports that he had "slipped/ fallen on the ice and hit head and buttocks" that resulted to this admission.  Patient endorses Dr. Scarlette Calico with [facility] [place] as his primary care provider.    Patient verbalized using [facility] service and [facility] [address] to obtain medications without any problem so far.   Patient states managing his own medications at home straight out of the containers.   Patient reports that he was driving prior to admission but wife [name]) will be able to provide transportation to his doctors' appointments when he gets home.   Patient states that he lives with wife at home who serves as his primary caregiver.  Anticipated discharge plan is [facility] (C[facility]) as recommended by therapy prior to returning home.   Patient voiced understanding to call primary care provider's office when he returns back home, for a post discharge follow-up within 1-2 weeks or sooner if needs arise. Patient letter (with PCP's contact number) was provided as a reminder.   Discussed with patient regarding [facility] CM services available for health management at home. He verbalized that he had been managing self so far.  He expressed understanding of need to seek referral from primary care provider to [facility] care management if deemed necessary and appropriate for services in the near future.   [facility] care management information was provided for future needs that he may have.    For additional questions please contact:  Edwena Felty A. Terryn Rosenkranz, BSN, RN-BC [facility] PRIMARY CARE Navigator Cell: [phone]

## 2017-06-28 NOTE — Progress Notes (Signed)
Patient and spouse welcomed to unit, oriented to safety precautions and unit schedule. Questions answered. Patient resting comfortably with call bell in place.

## 2017-06-28 NOTE — Progress Notes (Signed)
[name], RN  Rehab Admission Coordinator  Physical Medicine and Rehabilitation  PMR Pre-admission  Signed  Date of Service:  06/28/2017 11:18 AM       Related encounter: ED to Hosp-Admission (Current) from 06/23/2017 in [facility]      Signed           [] Hide copied text  [] Hover for details   PMR Admission Coordinator Pre-Admission Assessment  Patient: Frank [name] is an 76 y.o., male MRN: [id number] DOB: 1940-11-05 Height: 5\' 10"  (177.8 cm) Weight: 94.1 kg (207 lb 7.3 oz)                                                                                                                                                  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      Policy#: claim # [id number]      Subscriber: pt CM Name: Lulu Riding      Phone#: [phone]     Fax#: [phone] Pre-Cert#: approved for injuries related to fall for lower back and back of head      Employer: [employer]  Adjuster is Charlott Holler phone [place] group phone [phone]  SECONDARY: [facility] Medicare      Policy#: [id number]      Subscriber: pt  Medicaid Application Date:       Case Manager:  Disability Application Date:       Case Worker:   Emergency [place] Information    Name Relation Home Work [place] Spouse [phone]  [phone]   Ikaika, Showers   [phone]   [name] Daughter   [phone]     Current Medical History  Patient Admitting Diagnosis: right parietal ICH  History of Present Illness:    : [id number] Oziel Beitler a 76 y.o.right handed malewith history of diabetes mellitus, hyperlipidemia, hypertension, right shoulder chronic rotator cuff tear followed by Dr. Noemi Chapel as well as chronic right distal clavicle fracture. Presented 06/23/2017 with multiple falls over this past year and latest fall  stating he slipped on the ice after becoming dizzy.  Cranial CT scan showed an area of hemorrhage in the posterior right parietal lobe measuring 3.2 x 1.8 cm. No significant mass effect or midline shift. Cervical spine films negative. Follow-up MRI identifies a 3.7 cm right parietal hemorrhage with unchanged mild surrounding edema and no mass effect. 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 Echocardiogramwith ejection fraction of 98% grade 1 diastolic dysfunction.Carotid Dopplers in no ICA stenosis. X-rays of sacrum coccyx 06/26/2017 showed question upper coccygealnondisplaced fracture advise conservative care per orthopedic services. 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   Total: 0 NIHSS  Past Medical History      Past Medical History:  Diagnosis Date  . Allergy    Rhinitis  . Anemia    NOS iron deficient and B12 deficient  . Arthritis   . Diabetes mellitus    Type 2  . GERD (gastroesophageal reflux disease)   . Hyperlipidemia   . Hypertension   . 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 OSA (obstructive sleep apnea)    cpap    Family History  family history includes Allergies in his father; Breast cancer in his mother; Coronary artery disease in his brother; Heart disease in his mother; Stroke in his father.  Prior Rehab/Hospitalizations:  Has the patient had major surgery during 100 days prior to admission? No  Current Medications   Current Facility-Administered Medications:  .  acetaminophen (TYLENOL) tablet 650 mg, 650 mg, Oral, Q4H PRN, 650 mg at 06/28/17 0947 **OR** acetaminophen (TYLENOL) solution 650 mg, 650 mg, Per Tube, Q4H PRN **OR** acetaminophen (TYLENOL) suppository 650 mg, 650 mg, Rectal, Q4H PRN, [name], MD .  amLODipine (NORVASC)  tablet 10 mg, 10 mg, Oral, Daily, Rosalin Hawking, MD, 10 mg at 06/28/17 0948 .  atorvastatin (LIPITOR) tablet 10 mg, 10 mg, Oral, Daily, [name], MD, 10 mg at 06/28/17 0948 .  doxazosin (CARDURA) tablet 2 mg, 2 mg, Oral, QHS, [name], MD, 2 mg at 06/27/17 2139 .  FLUoxetine (PROZAC) capsule 20 mg, 20 mg, Oral, Daily, [name], MD, 20 mg at 06/28/17 0947 .  insulin aspart (novoLOG) injection 0-15 Units, 0-15 Units, Subcutaneous, TID WC, [name], MD, 3 Units at 06/28/17 [phone] .  irbesartan (AVAPRO) tablet 150 mg, 150 mg, Oral, BID, Rosalin Hawking, MD, 150 mg at 06/28/17 0948 .  labetalol (NORMODYNE,TRANDATE) injection 10-20 mg, 10-20 mg, Intravenous, Q2H PRN, Rosalin Hawking, MD, 10 mg at 06/27/17 1728 .  ondansetron (ZOFRAN) injection 4 mg, 4 mg, Intravenous, Q6H PRN, Amie Portland, MD, 4 mg at 06/28/17 0945 .  oxybutynin (DITROPAN) tablet 5 mg, 5 mg, Oral, BID, [name], MD, 5 mg at 06/28/17 0948 .  pantoprazole (PROTONIX) EC tablet 40 mg, 40 mg, Oral, Daily, Masters, Jake Church, RPH, 40 mg at 06/27/17 2139 .  senna-docusate (Senokot-S) tablet 1 tablet, 1 tablet, Oral, BID, [name], MD, 1 tablet at 06/28/17 3976  Patients Current Diet: Diet Carb Modified Fluid consistency: Thin; Room service appropriate? Yes  Precautions / Restrictions Precautions Precautions: Fall Precaution Comments: left bias Restrictions Weight Bearing Restrictions: No   Has the patient had 2 or more falls or a fall with injury in the past year?Yes  Prior Activity Level Community (5-7x/wk): Independent and driving pta; No AD  Home Assistive Devices / Equipment Home Assistive Devices/Equipment: None Home Equipment: None  Prior Device Use: Indicate devices/aids used by the patient prior to current illness, exacerbation or injury? None of the above  Prior Functional Level Prior Function Level of Independence: Independent Comments:  drove rental cars as job; 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  Self Care: Did the patient need help bathing, dressing, using the toilet or eating?  Independent  Indoor Mobility: Did the patient need assistance with walking from room to room (with or without device)? Independent  Stairs: Did the patient need assistance with internal or external stairs (with or without device)? Independent  Functional Cognition: Did the patient need help planning regular tasks such  as shopping or remembering to take medications? Independent  Current Functional Level Cognition  Overall Cognitive Status: Within Functional Limits for tasks assessed Current Attention Level: Selective Orientation Level: Oriented X4 Safety/Judgement: Decreased awareness of safety General Comments: Pt acknowledges that "they tell me I'm leaning to the left but I don't feel it". Pt accepts that he is unaware of when he is leaning to his left.     Extremity Assessment (includes Sensation/Coordination)  Upper Extremity Assessment: LUE deficits/detail LUE Deficits / Details: sensorimotor deficits. Appears to have impaired propriception LUE Sensation: decreased proprioception LUE Coordination: decreased gross motor  Lower Extremity Assessment: LLE deficits/detail LLE Sensation: decreased proprioception    ADLs  Overall ADL's : Needs assistance/impaired Eating/Feeding: Set up, Supervision/ safety Grooming: Minimal assistance, Sitting Upper Body Bathing: Minimal assistance, Bed level Upper Body Bathing Details (indicate cue type and reason): unable to sit unsupported to bath Lower Body Bathing: Moderate assistance, Sit to/from stand Upper Body Dressing : Moderate assistance, Sitting Lower Body Dressing: Maximal assistance, Sit to/from stand Toilet Transfer: +2 for physical assistance, Maximal assistance Toileting- Clothing Manipulation and Hygiene: Moderate assistance Functional mobility during ADLs: +2 for  physical assistance, Maximal assistance, Cueing for sequencing, Cueing for safety General ADL Comments: unable to sit unsupported EOB    Mobility  Overal bed mobility: Needs Assistance Bed Mobility: Rolling, Sidelying to Sit Rolling: Min assist Sidelying to sit: Mod assist, HOB elevated General bed mobility comments: Pt uses bed railing to assist in bed mobility. Pt with nauesea upon sitting up.     Transfers  Overall transfer level: Needs assistance Equipment used: Rolling walker (2 wheeled) Transfers: Sit to/from Stand Sit to Stand: Min assist General transfer comment: VCs for hand placement. x1 from EOB. Pt in chair post-ambulation.     Ambulation / Gait / Stairs / Wheelchair Mobility  Ambulation/Gait Ambulation/Gait assistance: Museum/gallery curator (Feet): 100 Feet Assistive device: None, Rolling walker (2 wheeled) Gait Pattern/deviations: Step-to pattern, Drifts right/left, Decreased step length - right, Decreased step length - left General Gait Details: RW was utilized at beginning of session, however pt with short steps and step-to gait. As pt previously without use of ADs, RW was set aside and pt was able to take larger steps with PT assist and VCs. Pt drifts towards left consistently during ambulation and requires VCs to move right. Pt has developed compensated right lateral trunk lean. Pt with left foot rotated outwards, requiring verbal and visual cues to correct (mirror). Pt staggers to left once with PT min assist to regain balance. Gait velocity: decreased Gait velocity interpretation: Below normal speed for age/gender    Posture / Balance Dynamic Sitting Balance Sitting balance - Comments: Pt demonstrates sittin EOB with hands in lap without external support, however has slight posterior lean. Pt unable to shift weight without external support.  Balance Overall balance assessment: Needs assistance Sitting-balance support: No upper extremity  supported Sitting balance-Leahy Scale: Fair Sitting balance - Comments: Pt demonstrates sittin EOB with hands in lap without external support, however has slight posterior lean. Pt unable to shift weight without external support.  Postural control: Left lateral lean Standing balance support: No upper extremity supported, During functional activity Standing balance-Leahy Scale: Fair Standing balance comment: Pt tolerates standing without external support momentarily, however requires therapist assist or external support from surface in order to maintain balance with weight shifting.    Special needs/care consideration BiPAP/CPAP  N/a CPM  N/a Continuous Drip IV  N/a Dialysis  N/a Life Vest  N/a Oxygen  N/a Special Bed  N/a Trach Size  N/a Wound Vac  N/a Skin  intact                            Bowel mgmt: LBM 06/28/2017 Bladder mgmt: continent Diabetic mgmt  N/a   Previous Home Environment Living Arrangements: Spouse/significant other  Lives With: Spouse Available Help at Discharge: Family, Available 24 hours/day Type of Home: House Home Layout: One level Home Access: Stairs to enter Entrance Stairs-Rails: Left, Right, Can reach both Entrance Stairs-Number of Steps: 4 Bathroom Shower/Tub: Multimedia programmer: Standard Bathroom Accessibility: Yes How Accessible: Accessible via walker [place]: No  Discharge Living Setting Plans for Discharge Living Setting: Patient's home, Lives with (comment)(wife) Type of Home at Discharge: House Discharge Home Layout: One level Discharge Home Access: Stairs to enter Entrance Stairs-Rails: Right, Left, Can reach both Entrance Stairs-Number of Steps: 4 Discharge Bathroom Shower/Tub: Walk-in shower Discharge Bathroom Toilet: Standard Discharge Bathroom Accessibility: Yes How Accessible: Accessible via walker Does the patient have any problems obtaining your medications?: No  Social/Family/Support  Systems Patient Roles: Spouse, Parent, Other (Comment)(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; Naval architect for [employer]) Contact Information: [name], wife Anticipated Caregiver: wife Anticipated Ambulance person Information: see above Ability/Limitations of Caregiver: wife works from home Caregiver Availability: 24/7 Discharge Plan Discussed with Primary Caregiver: Yes Is Caregiver In Agreement with Plan?: Yes Does Caregiver/Family have Issues with Lodging/Transportation while Pt is in Rehab?: No  Wife works from home 3 days per week arranging appointments for [employer] Goals/Additional Needs Patient/Family Goal for Rehab: Mod I with PT and OT Expected length of stay: ELOS 10- 14 days Pt/Family Agrees to Admission and willing to participate: Yes Program Orientation Provided & Reviewed with Pt/Caregiver Including Roles  & Responsibilities: Yes  Decrease burden of Care through IP rehab admission: n/a  Possible need for SNF placement upon discharge: not anticipated  Patient Condition: This patient's medical and functional status has changed since the consult dated: 06/24/2017 in which the Rehabilitation Physician determined and documented that the patient's condition is appropriate for intensive rehabilitative care in an inpatient rehabilitation facility. See "History of Present Illness" (above) for medical update. Functional changes are: mod assist. Patient's medical and functional status update has been discussed with the Rehabilitation physician and patient remains appropriate for inpatient rehabilitation. Will admit to inpatient rehab today.  Preadmission Screen Completed By:  Cleatrice Burke, 06/28/2017 11:18 AM ______________________________________________________________________   Discussed status with Dr. Posey Pronto on 06/28/2017 at  1126 and received telephone approval for admission today.  Admission Coordinator:  Cleatrice Burke, time 1914 Date 06/28/2017              Cosigned by: Jamse Arn, MD at 06/28/2017 11:40 AM  Revision History

## 2017-06-28 NOTE — Discharge Summary (Signed)
Stroke Discharge Summary  Patient ID: Frank [name]   MRN: [id number]      DOB: [birth year]  Date of Admission: 06/23/2017 Date of Discharge: 06/28/2017  Attending Physician:  Rosalin Hawking, MD Consultant(s):  Treatment Team:  Renette Butters, MD rehabilitation medicine and orthopedic surgery Patient's PCP:  Janith Lima, MD  Discharge Diagnoses:  Active Problems:   Intracranial hematoma following injury ([place])   ICH (intracerebral hemorrhage) ([name]) Anemia Hypertension Hyperlipidemia Diabetes Mellitus 4 mm anterior communicating aneurysm asymptomatic Possible Sacral fracture - nondisplaced Right Clavicle Fracture - chronic  Past Medical History:  Diagnosis Date  . Allergy    Rhinitis  . Anemia    NOS iron deficient and B12 deficient  . Arthritis   . Diabetes mellitus    Type 2  . GERD (gastroesophageal reflux disease)   . Hyperlipidemia   . Hypertension   . 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 OSA (obstructive sleep apnea)    cpap   Past Surgical History:  Procedure Laterality Date  . APPENDECTOMY    . arm fracture Left 2011   with hardware  . COLONOSCOPY    . ESOPHAGOGASTRODUODENOSCOPY  2006   gastritis  . ROTATOR CUFF REPAIR    . SEPTOPLASTY  1959   Deviated Septum  . 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 amLODipine  10 mg Oral Daily  . atorvastatin  10 mg Oral Daily  . doxazosin  2 mg Oral QHS  . FLUoxetine  20 mg Oral Daily  . insulin aspart  0-15 Units Subcutaneous TID WC  . irbesartan  150 mg Oral BID  . oxybutynin  5 mg Oral BID  . pantoprazole  40 mg Oral Daily  . senna-docusate  1 tablet Oral BID   LABORATORY STUDIES CBC    Component Value Date/Time   WBC 7.7 06/28/2017 0438   RBC 3.90 (L) 06/28/2017 0438   HGB 10.4 (L) 06/28/2017 0438   HCT 33.1 (L) 06/28/2017 0438   PLT 189 06/28/2017 0438   MCV 84.9 06/28/2017 0438   MCH 26.7 06/28/2017 0438   MCHC 31.4 06/28/2017 0438    RDW 13.2 06/28/2017 0438   LYMPHSABS 1.4 05/04/2017 1000   MONOABS 0.7 05/04/2017 1000   EOSABS 0.3 05/04/2017 1000   BASOSABS 0.0 05/04/2017 1000   CMP    Component Value Date/Time   NA 136 06/28/2017 0438   K 4.1 06/28/2017 0438   CL 99 (L) 06/28/2017 0438   CO2 29 06/28/2017 0438   GLUCOSE 176 (H) 06/28/2017 0438   BUN 24 (H) 06/28/2017 0438   CREATININE 1.15 06/28/2017 0438   CALCIUM 9.1 06/28/2017 0438   PROT 6.6 12/03/2015 0849   ALBUMIN 4.0 12/03/2015 0849   AST 15 12/03/2015 0849   ALT 17 12/03/2015 0849   ALKPHOS 55 12/03/2015 0849   BILITOT 0.4 12/03/2015 0849   GFRNONAA 59 (L) 06/28/2017 0438   GFRAA >60 06/28/2017 0438   COAGS Lab Results  Component Value Date   INR 0.94 06/23/2017   Lipid Panel    Component Value Date/Time   CHOL 102 06/26/2017 0230   TRIG 88 06/26/2017 0230   HDL 42 06/26/2017 0230   CHOLHDL 2.4 06/26/2017 0230   VLDL 18 06/26/2017 0230   LDLCALC 42 06/26/2017 0230   HgbA1C  Lab Results  Component Value Date   HGBA1C 7.2 (H) 05/04/2017   Urinalysis  Component Value Date/Time   COLORURINE YELLOW 06/23/2017 1150   APPEARANCEUR CLEAR 06/23/2017 1150   LABSPEC 1.025 06/23/2017 1150   PHURINE 6.0 06/23/2017 1150   GLUCOSEU NEGATIVE 06/23/2017 1150   GLUCOSEU NEGATIVE 12/03/2015 0849   HGBUR TRACE (A) 06/23/2017 1150   BILIRUBINUR NEGATIVE 06/23/2017 1150   BILIRUBINUR n 07/19/2013 1136   KETONESUR 15 (A) 06/23/2017 1150   PROTEINUR 30 (A) 06/23/2017 1150   UROBILINOGEN 0.2 12/03/2015 0849   NITRITE NEGATIVE 06/23/2017 1150   LEUKOCYTESUR NEGATIVE 06/23/2017 1150   Urine Drug Screen     Component Value Date/Time   LABOPIA NONE DETECTED 06/23/2017 1150   COCAINSCRNUR NONE DETECTED 06/23/2017 1150   LABBENZ NONE DETECTED 06/23/2017 1150   AMPHETMU NONE DETECTED 06/23/2017 1150   THCU NONE DETECTED 06/23/2017 1150   LABBARB NONE DETECTED 06/23/2017 1150    Alcohol Level    Component Value Date/Time   ETH <10  06/23/2017 1235   SIGNIFICANT DIAGNOSTIC STUDIES  &  HISTORY OF PRESENT ILLNESS Ct Head & CT Cervical Spine Wo Contrast 06/23/2017 IMPRESSION: Area of hemorrhage in the posterior right parietal lobe measuring 3.2 x 1.8 cm. No significant mass effect or midline shift. Chronic sinusitis changes. Cervical spondylosis.  No acute findings in the cervical spine.   MRI/ MRA Head & Brain Wo Contrast Result Date: 06/24/2017 IMPRESSION: 1. 3.7 cm right parietal hemorrhage with unchanged mild surrounding edema and no mass effect. No surrounding infarct, underlying mass, or definite vascular malformation identified. No chronic hemorrhages to suggest cerebral amyloid angiopathy. 2. Mild chronic small vessel ischemic disease. 3. Patent circle of Willis without proximal branch occlusion or significant stenosis. 4. 4 mm anterior communicating aneurysm. Electronically Signed   By: Logan Bores M.D.   On: 06/24/2017 11:41   Echocardiogram:     Study Conclusions - Left ventricle: The cavity size was normal. There was mild concentric hypertrophy. Systolic function was normal. The estimated ejection fraction was in the range of 55% to 60%. There was an increased relative contribution of atrial contraction to ventricular filling. Doppler parameters are consistent with abnormal left ventricular relaxation (grade 1 diastolic dysfunction). - Aortic valve: Mildly calcified leaflets. There was trivial regurgitation. - Mitral valve: Calcified annulus. Valve area by pressure half-time: 2 cm^2. - Left atrium: The atrium was mildly dilated. - Pulmonary arteries: Systolic pressure could not be accurately estimated       Carotid Dopplers 06/26/2017 1-39% ICA stenosis. Vertebral artery flow is antegrade.  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 This is new since 03/18/2016  DG Sacral / Coccyx   06/26/2017 Question upper coccygeal nondisplaced fracture.  HOSPITAL COURSE ASSESSMENT: Mr. Jermie Hippe is a 76 y.o. male with PMH of HTN, HLD, DM and remote Hx of CVA who presents with an intra-parenchymal hematoma on CT head after a fall on the ice.   ICH: 3.7 cm right parietal hemorrhage with unchanged mild surrounding edema and no mass effect  Suspected Etiology: Indeterminate, likely posttraumatic, possible HTN hemorrhage contributed, patient had elevated blood pressure on admission.   Resultant Symptoms: Minimal left-sided weakness noted on today's exam.  Admission symptoms of dizziness, slurred speech, headache-all resolved at this time Stroke Risk Factors: diabetes mellitus, hyperlipidemia and hypertension Other Stroke Risk Factors: Advanced age, Hx stroke, Obstructive sleep apnea, on CPAP at home  Outstanding Stroke Work-up Studies:    Workup completed  06/24/2017: Patient's neuro exam is stable with minimal left-sided weakness  noted.  He denies headache.  Hemorrhage most likely posttraumatic due to patient falling on the ice backwards and hitting his head.  Patient's blood pressures have remained stable.  Home blood pressure medications have been restarted.  Patient will be monitored overnight.  He will be evaluated for possible CIR admission.  His family explains an approximate 2-year history of stumbling and unsteadiness with frequent falling.  Patient states during these episodes he does not feel dizziness or experience a headache.  He states he just feels off balance. He has had no workup for these symptoms.  He has presented with likely traumatic intracerebral hemorrhage. He needs close neurological monitoring and strict blood pressure control. MRI has ruled out any underlying vascular or structural lesions. Long discussion at the bedside with the patient, wife and daughter and answered questions. Mobilize out of bed and therapy consults  06/25/2017:  Patient's neuro exam remained stable.  Blood pressures have remained stable overnight.  IV fluids and Cardene drip discontinued.  Patient awaiting decision from CIR and bed availability.  Will likely be discharged to CIR.  Will resume aspirin 81 mg daily prior to discharge.  06/26/17:  BP still fluctuating, but getting better.  On Lipitor, doxazosin and Avapro.  Pending CIR.  Complaint of right clavicle and sacral region pain after fall, pending x-ray.   06/27/17:  BP still at high end, will add amlodipine on top of avapro and doxazosin. Complaint of right clavicle and sacral region pain after fall -> x-rays done and ortho consulted. Pending CIR.   PLAN  06/28/2017: D/C to CIR Today Continue Statin HOLD ASA 81 mg for now- will resume at follow up Neurology appointment and after Repeat Head CT to evaluate for bleeding. PT/OT/SLP -> CIR Ongoing aggressive stroke risk factor management Patient counseled to be compliant withhisstatin medication Follow up with [facility] in 6 weeks  HX OF STROKES: 20 years ago - No residual deficits  Possible Sacral and Chronic Rt Clavicle Fx Pt states he sees Dr Noemi Chapel for ortho. Dr Percell Miller has seen the patient yesterday - conservative management for now.  MEDICAL ISSUES: Anemia- Stable, Likely of chronic disease Hgb 10.4, Hct 33.1 today 12/17  HYPERTENSION: Stable - improved SBP goal less than 180/90  Long term BP goal normotensive. Restarted home B/P medications on 12/13, added amlodipine  Home Meds: Cardura, Micardis  HYPERLIPIDEMIA: Labs(Brief)          Component Value Date/Time   CHOL 102 06/26/2017 0230   TRIG 88 06/26/2017 0230   HDL 42 06/26/2017 0230   CHOLHDL 2.4 06/26/2017 0230   VLDL 18 06/26/2017 0230   LDLCALC 42 06/26/2017 0230    Home Meds:  Lipitor 10 mg LDL  goal < 70 Continued on  Lipitor to 10 mg daily Continue statin at discharge  DIABETES: RecentLabs  Lab Results  Component Value  Date   HGBA1C 7.2 (H) 05/04/2017    HgbA1c goal < 7.0 Currently on: Novolog Continue CBG monitoring and SSI to maintain glucose 140-180 mg/dl DM education   4 mm anterior communicating aneurysm asymptomatic Outpatient neurology surveillance  Hx of [place] Falls over the past 2 years Outpatient neurology workup  DISCHARGE EXAM Blood pressure 124/60, pulse 67, temperature 97.6 F (36.4 C), temperature source Oral, resp. rate 17, height 5\' 10"  (1.778 m), weight 94.1 kg (207 lb 7.3 oz), SpO2 96 %. General - Well nourished, well developed, in no apparent distress Respiratory - Lungs clear bilaterally. No wheezing. Cardiovascular - Regular rate  and rhythm  Neuro: Mental Status: Patient is awake, alert, oriented to person, place, month, year, and situation. Patient is able to give a clear and coherent history. No signs of aphasia or neglect Cranial Nerves: II: Visual Fields are full. Pupils are equal, round, and reactive to light.  III,IV, VI: EOMI without ptosis or diploplia.  V: Facial sensation is symmetric to temperature VII: Facial movement is symmetric.  VIII: hearing is intact to voice X: Uvula elevates symmetrically XI: Shoulder shrug is symmetric. XII: tongue is midline without atrophy or fasciculations.  Motor: Tone is normal. Bulk is normal. 5/5 strength was present in all four extremities except very mild Left sided weakness more with fine motor movements.  Orbits right over left upper extremity.  Minimum weakness of left grip Sensory: Sensation is symmetric to light touch and temperature in the arms and legs. Cerebellar: FNF intact bilaterally  Discharge Diet  Diet Carb Modified Fluid consistency: Thin; Room service appropriate? Yes liquids  DISCHARGE PLAN  Disposition:  Transfer to [facility] for ongoing PT, OT and ST Prior Home Stroke Medications:  aspirin 81 mg daily and Lipitor 10 mg  Discharge Stroke Meds:  Please discharge patient on  Lipitor 10 mg  ASA to be restarted after Repeat Head CT to evaluate for bleeding. Will address at follow up Neurology appointment.  Disposition: CIR Therapy Recs: CIR Home Equipment:  NONE Follow Up:     Follow-up Information    Marcial Pacas, MD. Schedule an appointment as soon as possible for a visit in 6 week(s).   Specialty:  Neurology Contact information: [place] [address] [phone]          Renette Butters, MD  Orthopedic Surgery [address] [place] [address]    Janith Lima, MD -PCP Follow up in 1-2 weeks. Recommend ongoing risk factor control by Primary Care Physician at time of discharge from inpatient rehabilitation.    Greater than 30 minutes were spent preparing discharge.  Renie Ora Stroke Neurology Team 06/28/2017 10:38 AM  I reviewed above note and agree with the assessment and plan. I have made any additions or clarifications directly to the above note. Pt was seen and examined. Pt doing well neurologically. Will admit to CIR today.  Orthopedics consult done and recommend conservative management and follow up with Dr. Percell Miller in 2 weeks. And his wife said he was following with Dr. Krista Blue at [facility] and will make appointment.   Rosalin Hawking, MD PhD Stroke Neurology 06/28/2017 3:28 PM

## 2017-06-28 NOTE — Consult Note (Signed)
ORTHOPAEDIC CONSULTATION  REQUESTING PHYSICIAN: Rosalin Hawking, MD  Chief Complaint: Coccyx pain.  Chronic right distal clavicle fracture.  Assessment / Plan: Active Problems:   Intracranial hematoma following injury (Frank [name])   ICH (intracerebral hemorrhage) ([name])  Right clavicle, chronic (~73mo nonunion   This is minimally symptomatic and he has good function.    Followed recently by Dr. 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    Follow symptomatically and continue non-operative management.  Possible Non-displaced coccygeal fracture: Non-operative management. WBAT. Follow up with Dr. TAlain Marionin the office in 2 weeks   HPI: Frank [name] a 76y.o. male who complains of discomfort about his tailbone after falling on it.  He is a longtime patient of Dr. WNoemi Chapeland has been followed for 3+ months for his Right clavicle fracture treated non-operatively.  His clavicle is minimally bothersome- he had some soreness when he was picked up from the ground.  He denies dysfunction in his right arm beyond baseline.  He is happy to follow both injuries symptomatically and follow-up in the office.   Past Medical History:  Diagnosis Date  . Allergy    Rhinitis  . Anemia    NOS iron deficient and B12 deficient  . Arthritis   . Diabetes mellitus    Type 2  . GERD (gastroesophageal reflux disease)   . Hyperlipidemia   . Hypertension   . 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 OSA (obstructive sleep apnea)    cpap   Past Surgical History:  Procedure Laterality Date  . APPENDECTOMY    . arm fracture Left 2011   with hardware  . COLONOSCOPY    . ESOPHAGOGASTRODUODENOSCOPY  2006   gastritis  . ROTATOR CUFF REPAIR    . SEPTOPLASTY  1959   Deviated Septum  . 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 Marital status: Married    Spouse name: None  .  Number of children: None  . Years of education: None  . Highest education level: None  Social Needs  . Financial resource strain: None  . Food insecurity - worry: None  . Food insecurity - inability: None  . Transportation needs - medical: None  . Transportation needs - non-medical: None  Occupational History  . None  Tobacco Use  . Smoking status: Former Smoker    Packs/day: 1.00    Last attempt to quit: 07/14/1983    Years since quitting: 33.9  . Smokeless tobacco: Never Used  Substance and Sexual Activity  . Alcohol use: Yes    Alcohol/week: 1.0 oz    Types: 2 Standard drinks or equivalent per week    Comment: occassionally  . Drug use: No  . Sexual activity: No  Other Topics Concern  . None  Social History Narrative  . None   Family History  Problem Relation Age of Onset  . Heart disease Mother   . Breast cancer Mother   . Stroke Father   . Allergies Father        Father and children  . Coronary artery disease Brother   . Diabetes Neg Hx   . Colon cancer Neg Hx    Allergies  Allergen Reactions  . Lisinopril Cough  . Penicillins     REACTION: unspecified  . Sulfamethoxazole     REACTION: as child   Prior to Admission  medications   Medication Sig Start Date End Date Taking? Authorizing Provider  aspirin 81 MG tablet Take 81 mg by mouth daily.     Yes [provider]  atorvastatin (LIPITOR) 20 MG tablet Take 0.5 tablets (10 mg total) by mouth daily. 07/17/16  Yes Janith Lima, MD  cetirizine (ZYRTEC) 10 MG tablet Take 10 mg by mouth daily.   Yes [provider]  doxazosin (CARDURA) 4 MG tablet Take 0.5 tablets (2 mg total) by mouth at bedtime. 07/17/16  Yes Janith Lima, MD  FLUoxetine (PROZAC) 20 MG capsule TAKE 1 CAPSULE BY MOUTH  DAILY Patient taking differently: TAKE 1 CAPSULE (61m) BY MOUTH  DAILY 03/01/17  Yes JJanith Lima MD  glipiZIDE (GLUCOTROL XL) 10 MG 24 hr tablet TAKE 1 TABLET BY MOUTH  DAILY Patient taking differently: TAKE  1 TABLET (153m BY MOUTH  DAILY 03/01/17  Yes JoJanith LimaMD  JANUVIA 100 MG tablet Take 100 mg by mouth daily.  05/01/17  Yes [provider]  metFORMIN (GLUCOPHAGE) 1000 MG tablet TAKE 1 TABLET BY MOUTH TWO  TIMES DAILY Patient taking differently: TAKE 1 TABLET (100070mBY MOUTH TWO  TIMES DAILY 05/18/17  Yes JonJanith LimaD  oxybutynin (DITROPAN) 5 MG tablet TAKE 1 TABLET BY MOUTH TWO  TIMES DAILY Patient taking differently: TAKE 1 TABLET (5mg58mY MOUTH three TIMES DAILY 03/01/17  Yes JoneJanith Lima  telmisartan (MICARDIS) 40 MG tablet TAKE 1 TABLET BY MOUTH  DAILY Patient taking differently: TAKE 1 TABLET (40mg22m MOUTH  DAILY 03/01/17  Yes JonesJanith Lima  Dg Sacrum/coccyx  Result Date: 06/26/2017 CLINICAL DATA:  Fall.  Buttock pain. EXAM: SACRUM AND COCCYX - 2+ VIEW COMPARISON:  None. FINDINGS: Cortical irregularity is noted in the upper coccyx region concerning for coccygeal fracture. This is nondisplaced. IMPRESSION: Question upper coccygeal nondisplaced fracture. Electronically Signed   By: KevinRolm Baptise   On: 06/26/2017 17:46   Dg Clavicle Right  Result Date: 06/26/2017 CLINICAL DATA:  Fall Wednesday.  Right clavicle pain. EXAM: RIGHT CLAVICLE - 2+ VIEWS COMPARISON:  Chest x-ray 03/18/2016 FINDINGS: Distal right clavicle fracture noted with displacement of distal fragments. The appearance suggests a chronic process with nonunion, but this is new since 03/18/2016. Degenerative changes in the glenohumeral joint. IMPRESSION: Distal right clavicle fracture with the appearance suggesting a chronic fracture and nonunion. This is new since 03/18/2016 Electronically Signed   By: KevinRolm Baptise   On: 06/26/2017 17:20    Positive ROS: All other systems have been reviewed and were otherwise negative with the exception of those mentioned in the HPI and as above.  Objective: Labs cbc Recent Labs    06/27/17 0412 06/28/17 0438  WBC 6.8 7.7  HGB 10.4* 10.4*    HCT 33.3* 33.1*  PLT 180 189    Labs inflam No results for input(s): CRP in the last 72 hours.  Invalid input(s): ESR  Labs coag No results for input(s): INR, PTT in the last 72 hours.  Invalid input(s): PT  Recent Labs    06/27/17 0412 06/28/17 0438  NA 136 136  K 4.1 4.1  CL 99* 99*  CO2 28 29  GLUCOSE 180* 176*  BUN 25* 24*  CREATININE 1.17 1.15  CALCIUM 9.0 9.1    Physical Exam: Vitals:   06/28/17 0110 06/28/17 0622  BP: 128/68 135/65  Pulse: 73 69  Resp: 20 20  Temp:  98.3 F (36.8  C)  SpO2: 97% 93%   General: Alert, no acute distress Mental status: Alert and Oriented x3 Neurologic: Speech Clear and organized, no gross focal findings or movement disorder appreciated. Respiratory: No cyanosis, no use of accessory musculature Cardiovascular: No pedal edema GI: Abdomen is soft and non-tender, non-distended. Skin: Warm and dry.  No lesions in the area of chief complaint. Extremities: Warm and well perfused w/o edema Psychiatric: Patient is competent for consent with normal mood and affect  MUSCULOSKELETAL:  He has range of motion to about 140 degrees of forward flexion right upper extremity.  Nontender over clavicle.  Neurovascularly intact distally.  Mild soreness reported about his coccyx pressure.  Other extremities are atraumatic with painless ROM and NVI.   Prudencio Burly III PA-C 06/28/2017 8:25 AM

## 2017-06-28 NOTE — Care Management Note (Signed)
Case Management Note  Patient Details  Name: Frank [name] MRN: [id number] Date of Birth: 09/22/1940  Subjective/Objective:                    Action/Plan: Pt discharging to CIR today. No further needs per CM.  Expected Discharge Date:  06/28/17               Expected Discharge Plan:  [place]  In-House Referral:     Discharge planning Services  CM Consult  Post Acute Care Choice:    Choice offered to:     DME Arranged:    DME Agency:     HH Arranged:    HH Agency:     Status of Service:  Completed, signed off  If discussed at [employer], dates discussed:    Additional Comments:  Pollie Friar, RN 06/28/2017, 11:05 AM

## 2017-06-28 NOTE — PMR Pre-admission (Signed)
PMR Admission Coordinator Pre-Admission Assessment  Patient: Frank [name] is an 76 y.o., male MRN: [id number] DOB: 16-Dec-1940 Height: 5\' 10"  (177.8 cm) Weight: 94.1 kg (207 lb 7.3 oz)              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      Policy#: claim # [id number]      Subscriber: pt CM Name: Lulu Riding      Phone#: [phone]     Fax#: [phone] Pre-Cert#: approved for injuries related to fall for lower back and back of head      Employer: [employer]  Adjuster is Charlott Holler phone [phone] [place] [employer] phone [phone]  SECONDARY: [facility] Medicare      Policy#: [id number]      Subscriber: pt  Medicaid Application Date:       Case Manager:  Disability Application Date:       Case Worker:   Emergency Contact Information Contact Information    Name Relation Home Work Mobile   [place] Spouse [phone]  [phone]   Arafat, Cocuzza   [phone]   [name] Daughter   [phone]     Current Medical History  Patient Admitting Diagnosis: right parietal ICH  History of Present Illness:    : HPI: Frank [name] a 76 y.o.right handed malewith history of diabetes mellitus, hyperlipidemia, hypertension, right shoulder chronic rotator cuff tear followed by Dr. Noemi Chapel as well as chronic right distal clavicle fracture. Presented 06/23/2017 with multiple falls over this past year and latest fall stating he slipped on the ice after becoming dizzy.  Cranial CT scan showed an area of hemorrhage in the posterior right parietal lobe measuring 3.2 x 1.8 cm. No significant mass effect or midline shift. Cervical spine films negative. Follow-up MRI identifies a 3.7 cm right parietal hemorrhage with unchanged mild surrounding edema and no mass effect. 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 Echocardiogram with ejection fraction of 24% grade 1 diastolic dysfunction. Carotid Dopplers in no ICA  stenosis. X-rays of sacrum coccyx 06/26/2017 showed question upper coccygeal nondisplaced fracture advise conservative care per orthopedic services. 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 Tolerating a regular diet.   Total: 0 NIHSS    Past Medical History  Past Medical History:  Diagnosis Date  . Allergy    Rhinitis  . Anemia    NOS iron deficient and B12 deficient  . Arthritis   . Diabetes mellitus    Type 2  . GERD (gastroesophageal reflux disease)   . Hyperlipidemia   . Hypertension   . 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 OSA (obstructive sleep apnea)    cpap    Family History  family history includes Allergies in his father; Breast cancer in his mother; Coronary artery disease in his brother; Heart disease in his mother; Stroke in his father.  Prior Rehab/Hospitalizations:  Has the patient had major surgery during 100 days prior to admission? No  Current Medications   Current Facility-Administered Medications:  .  acetaminophen (TYLENOL) tablet 650 mg, 650 mg, Oral, Q4H PRN, 650 mg at 06/28/17 0947 **OR** acetaminophen (TYLENOL) solution 650 mg, 650 mg, Per Tube, Q4H PRN **OR** acetaminophen (TYLENOL) suppository 650 mg, 650 mg, Rectal, Q4H PRN, [name], MD .  amLODipine (NORVASC) tablet 10 mg, 10 mg, Oral, Daily, Rosalin Hawking, MD,  10 mg at 06/28/17 0948 .  atorvastatin (LIPITOR) tablet 10 mg, 10 mg, Oral, Daily, [name], MD, 10 mg at 06/28/17 0948 .  doxazosin (CARDURA) tablet 2 mg, 2 mg, Oral, QHS, [name], MD, 2 mg at 06/27/17 2139 .  FLUoxetine (PROZAC) capsule 20 mg, 20 mg, Oral, Daily, [name], MD, 20 mg at 06/28/17 0947 .  insulin aspart (novoLOG) injection 0-15 Units, 0-15 Units, Subcutaneous, TID WC, [name], MD, 3 Units at 06/28/17  [phone] .  irbesartan (AVAPRO) tablet 150 mg, 150 mg, Oral, BID, Rosalin Hawking, MD, 150 mg at 06/28/17 0948 .  labetalol (NORMODYNE,TRANDATE) injection 10-20 mg, 10-20 mg, Intravenous, Q2H PRN, Rosalin Hawking, MD, 10 mg at 06/27/17 1728 .  ondansetron (ZOFRAN) injection 4 mg, 4 mg, Intravenous, Q6H PRN, Amie Portland, MD, 4 mg at 06/28/17 0945 .  oxybutynin (DITROPAN) tablet 5 mg, 5 mg, Oral, BID, [name], MD, 5 mg at 06/28/17 0948 .  pantoprazole (PROTONIX) EC tablet 40 mg, 40 mg, Oral, Daily, Masters, Jake Church, RPH, 40 mg at 06/27/17 2139 .  senna-docusate (Senokot-S) tablet 1 tablet, 1 tablet, Oral, BID, [name], MD, 1 tablet at 06/28/17 2505  Patients Current Diet: Diet Carb Modified Fluid consistency: Thin; Room service appropriate? Yes  Precautions / Restrictions Precautions Precautions: Fall Precaution Comments: left bias Restrictions Weight Bearing Restrictions: No   Has the patient had 2 or more falls or a fall with injury in the past year?Yes  Prior Activity Level Community (5-7x/wk): Independent and driving pta; No AD  Home Assistive Devices / Equipment Home Assistive Devices/Equipment: None Home Equipment: None  Prior Device Use: Indicate devices/aids used by the patient prior to current illness, exacerbation or injury? None of the above  Prior Functional Level Prior Function Level of Independence: Independent Comments: drove rental cars as job; 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  Self Care: Did the patient need help bathing, dressing, using the toilet or eating?  Independent  Indoor Mobility: Did the patient need assistance with walking from room to room (with or without device)? Independent  Stairs: Did the patient need assistance with internal or external stairs (with or without device)? Independent  Functional Cognition: Did the patient need help planning regular tasks such as shopping or remembering to take medications?  Independent  Current Functional Level Cognition  Overall Cognitive Status: Within Functional Limits for tasks assessed Current Attention Level: Selective Orientation Level: Oriented X4 Safety/Judgement: Decreased awareness of safety General Comments: Pt acknowledges that "they tell me I'm leaning to the left but I don't feel it". Pt accepts that he is unaware of when he is leaning to his left.     Extremity Assessment (includes Sensation/Coordination)  Upper Extremity Assessment: LUE deficits/detail LUE Deficits / Details: sensorimotor deficits. Appears to have impaired propriception LUE Sensation: decreased proprioception LUE Coordination: decreased gross motor  Lower Extremity Assessment: LLE deficits/detail LLE Sensation: decreased proprioception    ADLs  Overall ADL's : Needs assistance/impaired Eating/Feeding: Set up, Supervision/ safety Grooming: Minimal assistance, Sitting Upper Body Bathing: Minimal assistance, Bed level Upper Body Bathing Details (indicate cue type and reason): unable to sit unsupported to bath Lower Body Bathing: Moderate assistance, Sit to/from stand Upper Body Dressing : Moderate assistance, Sitting Lower Body Dressing: Maximal assistance, Sit to/from stand Toilet Transfer: +2 for physical assistance, Maximal assistance Toileting- Clothing Manipulation and Hygiene: Moderate assistance Functional mobility during ADLs: +2 for physical assistance, Maximal assistance, Cueing for sequencing, Cueing for safety  General ADL Comments: unable to sit unsupported EOB    Mobility  Overal bed mobility: Needs Assistance Bed Mobility: Rolling, Sidelying to Sit Rolling: Min assist Sidelying to sit: Mod assist, HOB elevated General bed mobility comments: Pt uses bed railing to assist in bed mobility. Pt with nauesea upon sitting up.     Transfers  Overall transfer level: Needs assistance Equipment used: Rolling walker (2 wheeled) Transfers: Sit to/from Stand Sit  to Stand: Min assist General transfer comment: VCs for hand placement. x1 from EOB. Pt in chair post-ambulation.     Ambulation / Gait / Stairs / Wheelchair Mobility  Ambulation/Gait Ambulation/Gait assistance: Museum/gallery curator (Feet): 100 Feet Assistive device: None, Rolling walker (2 wheeled) Gait Pattern/deviations: Step-to pattern, Drifts right/left, Decreased step length - right, Decreased step length - left General Gait Details: RW was utilized at beginning of session, however pt with short steps and step-to gait. As pt previously without use of ADs, RW was set aside and pt was able to take larger steps with PT assist and VCs. Pt drifts towards left consistently during ambulation and requires VCs to move right. Pt has developed compensated right lateral trunk lean. Pt with left foot rotated outwards, requiring verbal and visual cues to correct (mirror). Pt staggers to left once with PT min assist to regain balance. Gait velocity: decreased Gait velocity interpretation: Below normal speed for age/gender    Posture / Balance Dynamic Sitting Balance Sitting balance - Comments: Pt demonstrates sittin EOB with hands in lap without external support, however has slight posterior lean. Pt unable to shift weight without external support.  Balance Overall balance assessment: Needs assistance Sitting-balance support: No upper extremity supported Sitting balance-Leahy Scale: Fair Sitting balance - Comments: Pt demonstrates sittin EOB with hands in lap without external support, however has slight posterior lean. Pt unable to shift weight without external support.  Postural control: Left lateral lean Standing balance support: No upper extremity supported, During functional activity Standing balance-Leahy Scale: Fair Standing balance comment: Pt tolerates standing without external support momentarily, however requires therapist assist or external support from surface in order to maintain  balance with weight shifting.    Special needs/care consideration BiPAP/CPAP  N/a CPM  N/a Continuous Drip IV  N/a Dialysis  N/a Life Vest  N/a Oxygen  N/a Special Bed  N/a Trach Size  N/a Wound Vac  N/a Skin  intact                            Bowel mgmt: LBM 06/28/2017 Bladder mgmt: continent Diabetic mgmt  N/a   Previous Home Environment Living Arrangements: Spouse/significant other  Lives With: Spouse Available Help at Discharge: Family, Available 24 hours/day Type of Home: House Home Layout: One level Home Access: Stairs to enter Entrance Stairs-Rails: Left, Right, Can reach both Entrance Stairs-Number of Steps: 4 Bathroom Shower/Tub: Multimedia programmer: Standard Bathroom Accessibility: Yes How Accessible: Accessible via walker Jewett: No  Discharge Living Setting Plans for Discharge Living Setting: Patient's home, Lives with (comment)(wife) Type of Home at Discharge: House Discharge Home Layout: One level Discharge Home Access: Stairs to enter Entrance Stairs-Rails: Right, Left, Can reach both Entrance Stairs-Number of Steps: 4 Discharge Bathroom Shower/Tub: Walk-in shower Discharge Bathroom Toilet: Standard Discharge Bathroom Accessibility: Yes How Accessible: Accessible via walker Does the patient have any problems obtaining your medications?: No  Social/Family/Support Systems Patient Roles: Spouse, Parent, Other (Comment)(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; 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 Information: [name], wife Anticipated Caregiver: wife Anticipated Ambulance person Information: see above Ability/Limitations of Caregiver: wife works from home Caregiver Availability: 24/7 Discharge Plan Discussed with Primary Caregiver: Yes Is Caregiver In Agreement with Plan?: Yes Does Caregiver/Family have Issues with Lodging/Transportation while Pt is in Rehab?: No  Wife works from home 3 days per week arranging appointments for  [employer] Goals/Additional Needs Patient/Family Goal for Rehab: Mod I with PT and OT Expected length of stay: ELOS 10- 14 days Pt/Family Agrees to Admission and willing to participate: Yes Program Orientation Provided & Reviewed with Pt/Caregiver Including Roles  & Responsibilities: Yes  Decrease burden of Care through IP rehab admission: n/a  Possible need for SNF placement upon discharge: not anticipated  Patient Condition: This patient's medical and functional status has changed since the consult dated: 06/24/2017 in which the Rehabilitation Physician determined and documented that the patient's condition is appropriate for intensive rehabilitative care in an inpatient rehabilitation facility. See "History of Present Illness" (above) for medical update. Functional changes are: mod assist. Patient's medical and functional status update has been discussed with the Rehabilitation physician and patient remains appropriate for inpatient rehabilitation. Will admit to inpatient rehab today.  Preadmission Screen Completed By:  Cleatrice Burke, 06/28/2017 11:18 AM ______________________________________________________________________   Discussed status with Dr. Posey Pronto on 06/28/2017 at  1126 and received telephone approval for admission today.  Admission Coordinator:  Cleatrice Burke, time [id number] Date 06/28/2017

## 2017-06-28 NOTE — H&P (Signed)
Physical Medicine and Rehabilitation Admission H&P    Chief Complaint  Patient presents with  . Fall  : HPI: Frank [name] is a 76 y.o. right handed male with history of diabetes mellitus, hyperlipidemia, hypertension, right shoulder chronic rotator cuff tear followed by Dr. Noemi Chapel as well as chronic right distal clavicle fracture. Per chart review and patient, presented 06/23/2017 with multiple falls over this past year and latest fall stating he slipped on the ice after becoming dizzy. Patient lives with spouse reported to be independent prior to admission. One level home with 4 steps to entry. Cranial CT scan reviewed, showing right parietal hemorrhage.  per report, area of hemorrhage in the posterior right parietal lobe measuring 3.2 x 1.8 cm. No significant mass effect or midline shift. Cervical spine films negative. Follow-up MRI identifies a 3.7 cm right parietal hemorrhage with unchanged mild surrounding edema and no mass effect. 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 Echocardiogram with ejection fraction of 83% grade 1 diastolic dysfunction. Carotid Dopplers in no ICA stenosis. X-rays of sacrum coccyx 06/26/2017 showed question upper coccygeal nondisplaced fracture advise conservative care per orthopedic services. 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 Tolerating a regular diet. Physical and occupational therapy evaluations completed 06/24/2017 with recommendations of physical medicine rehabilitation consult. Patient was admitted for a comprehensive rehabilitation program  Review of Systems  Constitutional: Negative for chills and fever.  HENT: Negative for hearing loss.   Eyes: Negative for blurred vision and double vision.  Respiratory: Negative for cough and shortness of breath.   Cardiovascular: Negative for  chest pain and palpitations.  Gastrointestinal: Positive for constipation.       GERD  Genitourinary: Positive for urgency.  Musculoskeletal: Positive for falls.  Skin: Negative for rash.  Neurological: Positive for dizziness. Negative for seizures.  Psychiatric/Behavioral: Positive for depression.  All other systems reviewed and are negative.  Past Medical History:  Diagnosis Date  . Allergy    Rhinitis  . Anemia    NOS iron deficient and B12 deficient  . Arthritis   . Diabetes mellitus    Type 2  . GERD (gastroesophageal reflux disease)   . Hyperlipidemia   . Hypertension   . 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 OSA (obstructive sleep apnea)    cpap   Past Surgical History:  Procedure Laterality Date  . APPENDECTOMY    . arm fracture Left 2011   with hardware  . COLONOSCOPY    . ESOPHAGOGASTRODUODENOSCOPY  2006   gastritis  . ROTATOR CUFF REPAIR    . SEPTOPLASTY  1959   Deviated Septum  . 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 Heart disease Mother   . Breast cancer Mother   . Stroke Father   . Allergies Father        Father and children  . Coronary artery disease Brother   . Diabetes Neg Hx   . Colon cancer Neg Hx    Social History:  reports that he quit smoking about 33 years ago. He smoked 1.00 pack per day. he has never used smokeless tobacco. He reports that he drinks about 1.0 oz of alcohol per week. He reports that he does not use drugs. Allergies:  Allergies  Allergen Reactions  . Lisinopril Cough  . Penicillins  REACTION: unspecified  . Sulfamethoxazole     REACTION: as child   Medications Prior to Admission  Medication Sig Dispense Refill  . aspirin 81 MG tablet Take 81 mg by mouth daily.      [name] atorvastatin (LIPITOR) 20 MG tablet Take 0.5 tablets (10 mg total) by mouth daily. 90 tablet 3  . cetirizine (ZYRTEC) 10 MG tablet Take 10 mg by mouth daily.    [name] doxazosin (CARDURA) 4 MG  tablet Take 0.5 tablets (2 mg total) by mouth at bedtime. 90 tablet 3  . FLUoxetine (PROZAC) 20 MG capsule TAKE 1 CAPSULE BY MOUTH  DAILY (Patient taking differently: TAKE 1 CAPSULE (36m) BY MOUTH  DAILY) 90 capsule 1  . glipiZIDE (GLUCOTROL XL) 10 MG 24 hr tablet TAKE 1 TABLET BY MOUTH  DAILY (Patient taking differently: TAKE 1 TABLET (139m BY MOUTH  DAILY) 90 tablet 1  . JANUVIA 100 MG tablet Take 100 mg by mouth daily.     . metFORMIN (GLUCOPHAGE) 1000 MG tablet TAKE 1 TABLET BY MOUTH TWO  TIMES DAILY (Patient taking differently: TAKE 1 TABLET (100083mBY MOUTH TWO  TIMES DAILY) 180 tablet 1  . oxybutynin (DITROPAN) 5 MG tablet TAKE 1 TABLET BY MOUTH TWO  TIMES DAILY (Patient taking differently: TAKE 1 TABLET (5mg62mY MOUTH three TIMES DAILY) 180 tablet 1  . telmisartan (MICARDIS) 40 MG tablet TAKE 1 TABLET BY MOUTH  DAILY (Patient taking differently: TAKE 1 TABLET (40mg17m MOUTH  DAILY) 90 tablet 1    Drug Regimen Review  Drug regimen was reviewed and remains appropriate with no significant issues identified  Home: Home Living Family/patient expects to be discharged to:: Private residence Living Arrangements: Spouse/significant other Available Help at Discharge: Family, Available 24 hours/day Type of Home: House Home Access: Stairs to enter [employer]: 4 Entrance Stairs-Rails: Left, Right, Can reach both Home Layout: One level Bathroom Shower/Tub: Walk-Multimedia programmerndard Bathroom Accessibility: Yes Home Equipment: None   Functional History: Prior Function Level of Independence: Independent Comments: drove rental cars as job; 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  Functional Status:  Mobility: Bed Mobility Overal bed mobility: Needs Assistance Bed Mobility: Rolling, Sidelying to Sit Rolling: Min assist Sidelying to sit: Mod assist General bed mobility comments: Pt OOB in chair upon PT arrival Transfers Overall transfer level:  Needs assistance Equipment used: Rolling walker (2 wheeled) Transfers: Sit to/from Stand Sit to Stand: Mod assist General transfer comment: Pt continues to lean torwards left when standing up from chair. x1 from chair. Pt in chair post-ambulation.  Ambulation/Gait Ambulation/Gait assistance: Mod assist Ambulation Distance (Feet): 100 Feet Assistive device: Rolling walker (2 wheeled) Gait Pattern/deviations: Step-to pattern, Decreased weight shift to right, Drifts right/left, Narrow base of support, Decreased step length - right, Decreased step length - left General Gait Details: Pt leans to the left and requires VCs to shift weight back towards center. Pt leads with RLE and steps-to with LLE. Pt's LUE shifts to an under-hand grip on RW during gait and pt is unable to determine that his LUE hand position is different from RUE.  Gait velocity: decreased Gait velocity interpretation: Below normal speed for age/gender    ADL: ADL Overall ADL's : Needs assistance/impaired Eating/Feeding: Set up, Supervision/ safety Grooming: Minimal assistance, Sitting Upper Body Bathing: Minimal assistance, Bed level Upper Body Bathing Details (indicate cue type and reason): unable to sit unsupported to bath Lower Body Bathing: Moderate assistance, Sit to/from stand Upper Body  Dressing : Moderate assistance, Sitting Lower Body Dressing: Maximal assistance, Sit to/from stand Toilet Transfer: +2 for physical assistance, Maximal assistance Toileting- Clothing Manipulation and Hygiene: Moderate assistance Functional mobility during ADLs: +2 for physical assistance, Maximal assistance, Cueing for sequencing, Cueing for safety General ADL Comments: unable to sit unsupported EOB  Cognition: Cognition Overall Cognitive Status: Impaired/Different from baseline Orientation Level: Oriented X4 Cognition Arousal/Alertness: Awake/alert Behavior During Therapy: WFL for tasks assessed/performed Overall Cognitive  Status: Impaired/Different from baseline Area of Impairment: Safety/judgement, Problem solving, Awareness, Attention Current Attention Level: Selective Safety/Judgement: Decreased awareness of safety Awareness: Emergent Problem Solving: Requires verbal cues General Comments: Pt acknowledges that "they tell me I'm leaning to the left but I don't feel it". Pt accepts that he is unaware of when he is leaning to his left.   Physical Exam: Blood pressure 135/65, pulse 69, temperature 98.3 F (36.8 C), temperature source Oral, resp. rate 20, height 5' 10" (1.778 m), weight 94.1 kg (207 lb 7.3 oz), SpO2 93 %. Physical Exam  Vitals reviewed. Constitutional: He is oriented to person, place, and time. He appears well-developed.  Obese  HENT:  Head: Normocephalic and atraumatic.  Eyes: EOM are normal. Right eye exhibits no discharge. Left eye exhibits no discharge.  Neck: Normal range of motion. Neck supple. No thyromegaly present.  Cardiovascular: Normal rate, regular rhythm and normal heart sounds.  Respiratory: Effort normal and breath sounds normal. No respiratory distress.  GI: Soft. Bowel sounds are normal. He exhibits no distension.  Musculoskeletal: He exhibits no edema or tenderness.  Neurological: He is alert and oriented to person, place, and time.  Motor:  RUE?RLE: 5/5 proximal to distal  LUE/LLE: 4+/5 proximal to distal LUE +dysmetria  Skin: Skin is warm and dry.  Psychiatric: He has a normal mood and affect. His behavior is normal. Thought content normal.   Results for orders placed or performed during the hospital encounter of 06/23/17 (from the past 48 hour(s))  Glucose, capillary     Status: Abnormal   Collection Time: 06/26/17 12:30 PM  Result Value Ref Range   Glucose-Capillary 233 (H) 65 - 99 mg/dL  Glucose, capillary     Status: Abnormal   Collection Time: 06/26/17  4:39 PM  Result Value Ref Range   Glucose-Capillary 164 (H) 65 - 99 mg/dL  Glucose, capillary      Status: Abnormal   Collection Time: 06/26/17  9:12 PM  Result Value Ref Range   Glucose-Capillary 200 (H) 65 - 99 mg/dL   Comment 1 Notify RN    Comment 2 Document in Chart   CBC     Status: Abnormal   Collection Time: 06/27/17  4:12 AM  Result Value Ref Range   WBC 6.8 4.0 - 10.5 K/uL   RBC 3.94 (L) 4.22 - 5.81 MIL/uL   Hemoglobin 10.4 (L) 13.0 - 17.0 g/dL   HCT 33.3 (L) 39.0 - 52.0 %   MCV 84.5 78.0 - 100.0 fL   MCH 26.4 26.0 - 34.0 pg   MCHC 31.2 30.0 - 36.0 g/dL   RDW 13.1 11.5 - 15.5 %   Platelets 180 150 - 400 K/uL  Basic metabolic panel     Status: Abnormal   Collection Time: 06/27/17  4:12 AM  Result Value Ref Range   Sodium 136 135 - 145 mmol/L   Potassium 4.1 3.5 - 5.1 mmol/L   Chloride 99 (L) 101 - 111 mmol/L   CO2 28 22 - 32 mmol/L   Glucose, Bld 180 (  H) 65 - 99 mg/dL   BUN 25 (H) 6 - 20 mg/dL   Creatinine, Ser 1.17 0.61 - 1.24 mg/dL   Calcium 9.0 8.9 - 10.3 mg/dL   GFR calc non Af Amer 58 (L) >60 mL/min   GFR calc Af Amer >60 >60 mL/min    Comment: (NOTE) The eGFR has been calculated using the CKD EPI equation. This calculation has not been validated in all clinical situations. eGFR's persistently <60 mL/min signify possible Chronic Kidney Disease.    Anion gap 9 5 - 15  Glucose, capillary     Status: Abnormal   Collection Time: 06/27/17  6:22 AM  Result Value Ref Range   Glucose-Capillary 191 (H) 65 - 99 mg/dL   Comment 1 Notify RN    Comment 2 Document in Chart   Glucose, capillary     Status: Abnormal   Collection Time: 06/27/17 11:25 AM  Result Value Ref Range   Glucose-Capillary 248 (H) 65 - 99 mg/dL  Glucose, capillary     Status: Abnormal   Collection Time: 06/27/17  4:54 PM  Result Value Ref Range   Glucose-Capillary 181 (H) 65 - 99 mg/dL  Glucose, capillary     Status: Abnormal   Collection Time: 06/27/17  9:50 PM  Result Value Ref Range   Glucose-Capillary 230 (H) 65 - 99 mg/dL   Comment 1 Notify RN    Comment 2 Document in Chart   CBC      Status: Abnormal   Collection Time: 06/28/17  4:38 AM  Result Value Ref Range   WBC 7.7 4.0 - 10.5 K/uL   RBC 3.90 (L) 4.22 - 5.81 MIL/uL   Hemoglobin 10.4 (L) 13.0 - 17.0 g/dL   HCT 33.1 (L) 39.0 - 52.0 %   MCV 84.9 78.0 - 100.0 fL   MCH 26.7 26.0 - 34.0 pg   MCHC 31.4 30.0 - 36.0 g/dL   RDW 13.2 11.5 - 15.5 %   Platelets 189 150 - 400 K/uL  Basic metabolic panel     Status: Abnormal   Collection Time: 06/28/17  4:38 AM  Result Value Ref Range   Sodium 136 135 - 145 mmol/L   Potassium 4.1 3.5 - 5.1 mmol/L   Chloride 99 (L) 101 - 111 mmol/L   CO2 29 22 - 32 mmol/L   Glucose, Bld 176 (H) 65 - 99 mg/dL   BUN 24 (H) 6 - 20 mg/dL   Creatinine, Ser 1.15 0.61 - 1.24 mg/dL   Calcium 9.1 8.9 - 10.3 mg/dL   GFR calc non Af Amer 59 (L) >60 mL/min   GFR calc Af Amer >60 >60 mL/min    Comment: (NOTE) The eGFR has been calculated using the CKD EPI equation. This calculation has not been validated in all clinical situations. eGFR's persistently <60 mL/min signify possible Chronic Kidney Disease.    Anion gap 8 5 - 15  Glucose, capillary     Status: Abnormal   Collection Time: 06/28/17  6:29 AM  Result Value Ref Range   Glucose-Capillary 166 (H) 65 - 99 mg/dL   Dg Sacrum/coccyx  Result Date: 06/26/2017 CLINICAL DATA:  Fall.  Buttock pain. EXAM: SACRUM AND COCCYX - 2+ VIEW COMPARISON:  None. FINDINGS: Cortical irregularity is noted in the upper coccyx region concerning for coccygeal fracture. This is nondisplaced. IMPRESSION: Question upper coccygeal nondisplaced fracture. Electronically Signed   By: Rolm Baptise M.D.   On: 06/26/2017 17:46   Dg Clavicle Right  Result Date:  06/26/2017 CLINICAL DATA:  Fall Wednesday.  Right clavicle pain. EXAM: RIGHT CLAVICLE - 2+ VIEWS COMPARISON:  Chest x-ray 03/18/2016 FINDINGS: Distal right clavicle fracture noted with displacement of distal fragments. The appearance suggests a chronic process with nonunion, but this is new since 03/18/2016.  Degenerative changes in the glenohumeral joint. IMPRESSION: Distal right clavicle fracture with the appearance suggesting a chronic fracture and nonunion. This is new since 03/18/2016 Electronically Signed   By: Rolm Baptise M.D.   On: 06/26/2017 17:20     Medical Problem List and Plan: 1.  Gait disorder and mild left hemiparesis secondary to right posterior parietal intraparenchymal hemorrhage with reported history of multiple falls 2.  DVT Prophylaxis/Anticoagulation: SCDs. Monitor for any signs of DVT. Check vascular study 3. Pain Management: Tylenol as needed 4. Mood: Prozac 20 mg daily. Provide emotional support 5. Neuropsych: This patient is capable of making decisions on his own behalf. 6. Skin/Wound Care: Routine skin checks 7. Fluids/Electrolytes/Nutrition: Routine I&O's with follow-up chemistries 8. Hypertension. Norvasc 10 mg daily, Cardura 2 mg daily at bedtime, Avapro 150 mg twice a day. Monitor with increased mobility 9. Diabetes mellitus peripheral neuropathy. Hemoglobin A1c 7.2. SSI. Check blood sugars before meals and at bedtime. Patient on Glucotrol XL 10 mg daily, Januvia 100 mg daily, Glucophage 1000 mg twice a day prior to admission. Resume as needed 10. BPH. The triptan 5 mg twice a day. Check PVR 3 11. Hyperlipidemia. Lipitor 12. History of chronic right rotator cuff tear as well as right distal clavicle fracture. Conservative care. Weightbearing as tolerated. Shoulder sling for comfort.  Post Admission Physician Evaluation: 1. Preadmission assessment reviewed and changes made below. 2. Functional deficits secondary  to right posterior parietal intraparenchymal hemorrhage with reported history of multiple falls. 3. Patient is admitted to receive collaborative, interdisciplinary care between the physiatrist, rehab nursing staff, and therapy team. 4. Patient's level of medical complexity and substantial therapy needs in context of that medical necessity cannot be provided  at a lesser intensity of care such as a SNF. 5. Patient has experienced substantial functional loss from his/her baseline which was documented above under the "Functional History" and "Functional Status" headings.  Judging by the patient's diagnosis, physical exam, and functional history, the patient has potential for functional progress which will result in measurable gains while on inpatient rehab.  These gains will be of substantial and practical use upon discharge  in facilitating mobility and self-care at the household level. 70. Physiatrist will provide 24 hour management of medical needs as well as oversight of the therapy plan/treatment and provide guidance as appropriate regarding the interaction of the two. 7. 24 hour rehab nursing will assist with bladder management, safety, disease management and patient education  and help integrate therapy concepts, techniques,education, etc. 8. PT will assess and treat for/with: Lower extremity strength, range of motion, stamina, balance, functional mobility, safety, adaptive techniques and equipment, woundcare, coping skills, pain control, education.   Goals are: Mod I. 9. OT will assess and treat for/with: ADL's, functional mobility, safety, upper extremity strength, adaptive techniques and equipment, wound mgt, ego support, and community reintegration.   Goals are: Mod I. Therapy may proceed with showering this patient. 10. Case Management and Social Worker will assess and treat for psychological issues and discharge planning. 11. Team conference will be held weekly to assess progress toward goals and to determine barriers to discharge. 12. Patient will receive at least 3 hours of therapy per day at least 5 days per week. 13.  ELOS: 6-9 days.       14. Prognosis:  good  Delice Lesch, MD, ABPMR Lavon Paganini Angiulli, PA-C 06/28/2017

## 2017-06-28 NOTE — Care Management Important Message (Signed)
Important Message  Patient Details  Name: Frank [name] MRN: [id number] Date of Birth: 1941-04-30   Medicare Important Message Given:  Yes    [name] 06/28/2017, 10:06 AM

## 2017-06-28 NOTE — Progress Notes (Signed)
Inpatient Diabetes Program Recommendations  AACE/ADA: New Consensus Statement on Inpatient Glycemic Control (2015)  Target Ranges:  Prepandial:   less than 140 mg/dL      Peak postprandial:   less than 180 mg/dL (1-2 hours)      Critically ill patients:  140 - 180 mg/dL   Results for Frank [name], Frank [name] (MRN [id number]) as of 06/28/2017 09:16  Ref. Range 06/27/2017 06:22 06/27/2017 11:25 06/27/2017 16:54 06/27/2017 21:50  Glucose-Capillary Latest Ref Range: 65 - 99 mg/dL 191 (H) 248 (H) 181 (H) 230 (H)   Results for Frank [name], Frank [name] (MRN [id number]) as of 06/28/2017 09:16  Ref. Range 06/28/2017 06:29  Glucose-Capillary Latest Ref Range: 65 - 99 mg/dL 166 (H)    Home DM Meds: Januvia 100 mg daily       Metformin 1000 mg BID       Glipizide 10 mg daily  Current Insulin Orders: Novolog Moderate Correction Scale/ SSI (0-15 units) TID AC      MD- Note patient having elevated postprandial glucose levels.  Please consider starting Novolog Meal Coverage while home oral Diabetes medications are on hold:  Novolog 4 units TID with meals (hold if pt eats <50% of meal)  (Use Glycemic Control Order set)      --Will follow patient during hospitalization--  Wyn Quaker RN, MSN, CDE Diabetes Coordinator Inpatient Glycemic Control Team Team Pager: [phone] (8a-5p)

## 2017-06-28 NOTE — Progress Notes (Addendum)
I have workers Economist to admit pt to inpt rehab today via Lulu Riding, RN CM. [phone]. I met with pt and will make the arrangements. [phone]

## 2017-06-28 NOTE — Progress Notes (Signed)
Kirsteins, Luanna Salk, MD  Physician  Physical Medicine and Rehabilitation  Consult Note  Signed  Date of Service:  06/24/2017 1:36 PM       Related encounter: ED to Hosp-Admission (Current) from 06/23/2017 in [place] [facility]      Signed      Expand All Collapse All      [] Hide copied text  [] Hover for details        Physical Medicine and Rehabilitation Consult Reason for Consult: Decreased functional mobility with multiple falls Referring Physician: Dr. Leonie Man   HPI: Frank [name] is a 76 y.o. right handed male with history of diabetes mellitus, hyperlipidemia, hypertension. Presented 06/23/2017 with multiple falls over this past year and latest fall stating he slipped on the ice after becoming dizzy. Patient lives with spouse reported to be independent prior to admission. One level home with 4 steps to entry. Cranial CT scan showed an area of hemorrhage in the posterior right parietal lobe measuring 3.2 x 1.8 cm. No significant mass effect or midline shift. Cervical spine films negative. Follow-up MRI identifies a 3.7 cm right parietal hemorrhage with unchanged mild surrounding edema and no mass effect. 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 Echocardiogram is pending. Tolerating a regular diet. Physical therapy evaluation completed 06/24/2017 with recommendations of physical medicine rehabilitation consult.  Still has headache.  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  HENT: Negative for hearing loss.   Eyes: Negative for blurred vision and double vision.  Respiratory: Negative for cough and shortness of breath.   Cardiovascular: Positive for leg swelling. Negative for chest pain and palpitations.  Gastrointestinal: Positive for constipation. Negative for nausea  and vomiting.       GERD  Genitourinary: Positive for urgency. Negative for dysuria, flank pain and hematuria.  Musculoskeletal: Positive for falls.  Skin: Negative for rash.  Neurological: Positive for dizziness.  Psychiatric/Behavioral: Positive for depression.  All other systems reviewed and are negative.      Past Medical History:  Diagnosis Date  . Allergy    Rhinitis  . Anemia    NOS iron deficient and B12 deficient  . Arthritis   . Diabetes mellitus    Type 2  . GERD (gastroesophageal reflux disease)   . Hyperlipidemia   . Hypertension   . 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 OSA (obstructive sleep apnea)    cpap        Past Surgical History:  Procedure Laterality Date  . APPENDECTOMY    . arm fracture Left 2011   with hardware  . COLONOSCOPY    . ESOPHAGOGASTRODUODENOSCOPY  2006   gastritis  . ROTATOR CUFF REPAIR    . SEPTOPLASTY  1959   Deviated Septum  . 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 Heart disease Mother   . Breast cancer Mother   . Stroke Father   . Allergies Father        Father and children  . Coronary artery disease Brother   . Diabetes Neg Hx   . Colon cancer Neg Hx    Social History:  reports that he quit smoking about 33 years ago. He smoked 1.00 pack per day. he has never used smokeless  tobacco. He reports that he drinks about 1.0 oz of alcohol per week. He reports that he does not use drugs. Allergies:       Allergies  Allergen Reactions  . Lisinopril Cough  . Penicillins     REACTION: unspecified  . Sulfamethoxazole     REACTION: as child   Medications Prior to Admission  Medication Sig Dispense Refill  . aspirin 81 MG tablet Take 81 mg by mouth daily.      [name] atorvastatin (LIPITOR) 20 MG tablet Take 0.5 tablets (10 mg total) by mouth daily. 90 tablet 3  . cetirizine (ZYRTEC) 10 MG tablet Take 10 mg by mouth daily.     [name] doxazosin (CARDURA) 4 MG tablet Take 0.5 tablets (2 mg total) by mouth at bedtime. 90 tablet 3  . FLUoxetine (PROZAC) 20 MG capsule TAKE 1 CAPSULE BY MOUTH  DAILY (Patient taking differently: TAKE 1 CAPSULE (20mg ) BY MOUTH  DAILY) 90 capsule 1  . glipiZIDE (GLUCOTROL XL) 10 MG 24 hr tablet TAKE 1 TABLET BY MOUTH  DAILY (Patient taking differently: TAKE 1 TABLET (10mg ) BY MOUTH  DAILY) 90 tablet 1  . JANUVIA 100 MG tablet Take 100 mg by mouth daily.     . metFORMIN (GLUCOPHAGE) 1000 MG tablet TAKE 1 TABLET BY MOUTH TWO  TIMES DAILY (Patient taking differently: TAKE 1 TABLET (1000mg ) BY MOUTH TWO  TIMES DAILY) 180 tablet 1  . oxybutynin (DITROPAN) 5 MG tablet TAKE 1 TABLET BY MOUTH TWO  TIMES DAILY (Patient taking differently: TAKE 1 TABLET (5mg ) BY MOUTH three TIMES DAILY) 180 tablet 1  . telmisartan (MICARDIS) 40 MG tablet TAKE 1 TABLET BY MOUTH  DAILY (Patient taking differently: TAKE 1 TABLET (40mg ) BY MOUTH  DAILY) 90 tablet 1    Home: Home Living Family/patient expects to be discharged to:: Private residence Living Arrangements: Spouse/significant other Available Help at Discharge: Family, Available 24 hours/day Type of Home: House Home Access: Stairs to enter Technical brewer of Steps: 4 Entrance Stairs-Rails: Left, Right, Can reach both Home Layout: One level Bathroom Shower/Tub: Multimedia programmer: Standard Home Equipment: None  Functional History: Prior Function Level of Independence: Independent Functional Status:  Mobility: Bed Mobility Overal bed mobility: Needs Assistance Bed Mobility: Rolling, Sidelying to Sit Rolling: Min assist Sidelying to sit: Mod assist General bed mobility comments: cues for sequence with  assist to rotate trunk , bring legs off of bed and elevate trunk  Transfers Overall transfer level: Needs assistance Transfers: Sit to/from Stand Sit to Stand: Mod assist, +2 physical assistance, +2 safety/equipment General transfer  comment: mod assist for safety with rise as pt with significant left bias and requires frequent cues for midline and posture as well as awareness of foot position Ambulation/Gait Ambulation/Gait assistance: Mod assist, +2 safety/equipment, +2 physical assistance Ambulation Distance (Feet): 8 Feet Assistive device: 2 person hand held assist Gait Pattern/deviations: Step-to pattern, Narrow base of support General Gait Details: pt with heavy left lean requiring max cues to shift weight to the right as well as cues for positioning and progression of LLE as lack of proprioception. Physical assist at trunk for balance and stability. Walked 8' x 2 with seated rest between Gait velocity interpretation: Below normal speed for age/gender  ADL:  Cognition: Cognition Overall Cognitive Status: Impaired/Different from baseline Orientation Level: Oriented X4 Cognition Arousal/Alertness: Awake/alert Behavior During Therapy: WFL for tasks assessed/performed Overall Cognitive Status: Impaired/Different from baseline Area of Impairment: Safety/judgement, Problem solving Safety/Judgement: Decreased awareness of safety, Decreased awareness  of deficits Problem Solving: Requires verbal cues  Blood pressure 125/72, pulse 72, temperature 97.8 F (36.6 C), temperature source Oral, resp. rate 20, height 5\' 10"  (1.778 m), weight 94.1 kg (207 lb 7.3 oz), SpO2 94 %. Physical Exam  Vitals reviewed. Constitutional: He is oriented to person, place, and time.  HENT:  Head: Normocephalic.  Eyes: EOM are normal.  Neck: Normal range of motion. Neck supple. No thyromegaly present.  Cardiovascular: Normal rate, regular rhythm and normal heart sounds.  Respiratory: Effort normal and breath sounds normal. No respiratory distress.  GI: Soft. Bowel sounds are normal. He exhibits no distension.  Neurological: He is alert and oriented to person, place, and time.  Follows commands  Skin: Skin is warm and dry.  Motor  strength is 5/5 in the right deltoid, bicep, tricep, grip, hip flexor, knee extensor, ankle dorsiflexor, 4+ in the left deltoid bicep tricep grip hip flexor knee extensor ankle dorsiflexor Cerebellar shows no evidence dysmetria finger-nose-finger or heel-to-shin testing. Decreased visual fields left eye lower quadrants  LabResultsLast24Hours       Results for orders placed or performed during the hospital encounter of 06/23/17 (from the past 24 hour(s))  MRSA PCR Screening     Status: None   Collection Time: 06/23/17  3:50 PM  Result Value Ref Range   MRSA by PCR NEGATIVE NEGATIVE  Glucose, capillary     Status: Abnormal   Collection Time: 06/23/17  4:33 PM  Result Value Ref Range   Glucose-Capillary 185 (H) 65 - 99 mg/dL   Comment 1 Notify RN    Comment 2 Document in Chart   Glucose, capillary     Status: Abnormal   Collection Time: 06/23/17  8:43 PM  Result Value Ref Range   Glucose-Capillary 176 (H) 65 - 99 mg/dL  Glucose, capillary     Status: Abnormal   Collection Time: 06/24/17  7:57 AM  Result Value Ref Range   Glucose-Capillary 189 (H) 65 - 99 mg/dL  Glucose, capillary     Status: Abnormal   Collection Time: 06/24/17 11:23 AM  Result Value Ref Range   Glucose-Capillary 146 (H) 65 - 99 mg/dL      ImagingResults(Last48hours)  Ct Head Wo Contrast  Addendum Date: 06/23/2017   ADDENDUM REPORT: 06/23/2017 12:09 ADDENDUM: Dr. Leonel Ramsay called to discuss this case. Given that the patient fell this morning, the surrounding edema is more than one might expect and it is possible that this could be a nontraumatic intraparenchymal hemorrhage either due to stroke, mass or amyloid angiopathy. The differential diagnosis remains that of post traumatic intraparenchymal hemorrhage. Scalp soft tissue swelling is seen in the posterior midline. Electronically Signed   By: Nelson Chimes M.D.   On: 06/23/2017 12:09   Result Date: 06/23/2017 CLINICAL DATA:  Fall, hit  back of head. EXAM: CT HEAD WITHOUT CONTRAST CT CERVICAL SPINE WITHOUT CONTRAST TECHNIQUE: Multidetector CT imaging of the head and cervical spine was performed following the standard protocol without intravenous contrast. Multiplanar CT image reconstructions of the cervical spine were also generated. COMPARISON:  03/14/2017 FINDINGS: CT HEAD FINDINGS Brain: There is an area of hemorrhage within the right posterior parietal lobe measuring 3.2 x 1.8 cm. Small amount of surrounding edema. No significant mass effect or midline shift. No hydrocephalus. Vascular: No hyperdense vessel or unexpected calcification. Skull: No acute calvarial abnormality. Sinuses/Orbits: Mucosal thickening within the paranasal sinuses. No acute finding. Other: None CT CERVICAL SPINE FINDINGS Alignment: Normal Skull base and vertebrae: No fractured Soft tissues  and spinal canal: Prevertebral soft tissues are normal. No epidural or paraspinal hematoma. Disc levels:  Diffuse degenerative disc disease and facet disease. Upper chest: No acute findings. Other: Dense carotid artery/ bulb calcifications bilaterally. IMPRESSION: Area of hemorrhage in the posterior right parietal lobe measuring 3.2 x 1.8 cm. No significant mass effect or midline shift. Chronic sinusitis changes. Cervical spondylosis.  No acute findings in the cervical spine. Critical Value/emergent results were called by telephone at the time of interpretation on 06/23/2017 at 10:58 am to Dr. Davonna Belling , who verbally acknowledged these results. Electronically Signed: By: Rolm Baptise M.D. On: 06/23/2017 10:58   Ct Cervical Spine Wo Contrast  Addendum Date: 06/23/2017   ADDENDUM REPORT: 06/23/2017 12:09 ADDENDUM: Dr. Leonel Ramsay called to discuss this case. Given that the patient fell this morning, the surrounding edema is more than one might expect and it is possible that this could be a nontraumatic intraparenchymal hemorrhage either due to stroke, mass or amyloid  angiopathy. The differential diagnosis remains that of post traumatic intraparenchymal hemorrhage. Scalp soft tissue swelling is seen in the posterior midline. Electronically Signed   By: Nelson Chimes M.D.   On: 06/23/2017 12:09   Result Date: 06/23/2017 CLINICAL DATA:  Fall, hit back of head. EXAM: CT HEAD WITHOUT CONTRAST CT CERVICAL SPINE WITHOUT CONTRAST TECHNIQUE: Multidetector CT imaging of the head and cervical spine was performed following the standard protocol without intravenous contrast. Multiplanar CT image reconstructions of the cervical spine were also generated. COMPARISON:  03/14/2017 FINDINGS: CT HEAD FINDINGS Brain: There is an area of hemorrhage within the right posterior parietal lobe measuring 3.2 x 1.8 cm. Small amount of surrounding edema. No significant mass effect or midline shift. No hydrocephalus. Vascular: No hyperdense vessel or unexpected calcification. Skull: No acute calvarial abnormality. Sinuses/Orbits: Mucosal thickening within the paranasal sinuses. No acute finding. Other: None CT CERVICAL SPINE FINDINGS Alignment: Normal Skull base and vertebrae: No fractured Soft tissues and spinal canal: Prevertebral soft tissues are normal. No epidural or paraspinal hematoma. Disc levels:  Diffuse degenerative disc disease and facet disease. Upper chest: No acute findings. Other: Dense carotid artery/ bulb calcifications bilaterally. IMPRESSION: Area of hemorrhage in the posterior right parietal lobe measuring 3.2 x 1.8 cm. No significant mass effect or midline shift. Chronic sinusitis changes. Cervical spondylosis.  No acute findings in the cervical spine. Critical Value/emergent results were called by telephone at the time of interpretation on 06/23/2017 at 10:58 am to Dr. Davonna Belling , who verbally acknowledged these results. Electronically Signed: By: Rolm Baptise M.D. On: 06/23/2017 10:58   Mr Jodene Nam Head Wo Contrast  Result Date: 06/24/2017 CLINICAL DATA:  Right parietal  hemorrhage.  Fall. EXAM: MRI HEAD WITHOUT AND WITH CONTRAST MRA HEAD WITHOUT CONTRAST TECHNIQUE: Multiplanar, multiecho pulse sequences of the brain and surrounding structures were obtained without and with intravenous contrast. Angiographic images of the head were obtained using MRA technique without contrast. CONTRAST:  1mL MULTIHANCE GADOBENATE DIMEGLUMINE 529 MG/ML IV SOLN COMPARISON:  Head CT 06/23/2017 FINDINGS: MRI HEAD FINDINGS Brain: The a medial right parietal parenchymal hematoma measures 3.7 x 2.0 cm, stable to minimally larger than on the prior CT with the apparent change possibly due to differences in modality. Mild surrounding edema is similar to the prior CT, and there is no significant mass effect. No underlying enhancing mass is identified. There is a small vein coursing along the medial aspect of the hematoma, however no vascular nidus or enlarged draining vein is identified. This vein may  be coursing within an effaced sulcus or could reflect a small developmental venous anomaly, with the former favored. No diffusion abnormality is seen around the hemorrhage to indicate an infarct. There is no evidence of acute or chronic intracranial hemorrhage elsewhere. Scattered small foci of T2 hyperintensity in the subcortical and deep cerebral white matter bilaterally are nonspecific but compatible with mild chronic small vessel ischemic disease. Minimal chronic small vessel changes are noted in the pons. There is mild cerebral atrophy. There is no midline shift or extra-axial fluid collection. Vascular: Major intracranial vascular flow voids are preserved. Skull and upper cervical spine: Unremarkable bone marrow signal. Sinuses/Orbits: Bilateral cataract extraction. Moderate mucosal thickening in the bilateral ethmoid and right maxillary sinuses. Small to moderate volume fluid in the right maxillary sinus. Small left mastoid effusion. Other: None. MRA HEAD FINDINGS The visualized distal vertebral arteries  are patent to the basilar. Patent PICA, AICA, and SCA origins are identified bilaterally. The basilar artery is widely patent. There are medium sized posterior communicating arteries bilaterally. The PCAs are patent without evidence of significant stenosis. The internal carotid arteries are widely patent from skullbase to carotid termini. The ACAs and MCAs are patent without evidence of proximal branch occlusion or significant proximal stenosis. An aneurysm projecting anterosuperiorly in the anterior communicating region measures 4 x 2 mm. The MRA did not extend completely through the right parietal hemorrhage, however no abnormal arterial structures are seen along the inferior aspect of the hemorrhage. IMPRESSION: 1. 3.7 cm right parietal hemorrhage with unchanged mild surrounding edema and no mass effect. No surrounding infarct, underlying mass, or definite vascular malformation identified. No chronic hemorrhages to suggest cerebral amyloid angiopathy. 2. Mild chronic small vessel ischemic disease. 3. Patent circle of Willis without proximal branch occlusion or significant stenosis. 4. 4 mm anterior communicating aneurysm. Electronically Signed   By: Logan Bores M.D.   On: 06/24/2017 11:41   Mr Jeri Cos UE Contrast  Result Date: 06/24/2017 CLINICAL DATA:  Right parietal hemorrhage.  Fall. EXAM: MRI HEAD WITHOUT AND WITH CONTRAST MRA HEAD WITHOUT CONTRAST TECHNIQUE: Multiplanar, multiecho pulse sequences of the brain and surrounding structures were obtained without and with intravenous contrast. Angiographic images of the head were obtained using MRA technique without contrast. CONTRAST:  72mL MULTIHANCE GADOBENATE DIMEGLUMINE 529 MG/ML IV SOLN COMPARISON:  Head CT 06/23/2017 FINDINGS: MRI HEAD FINDINGS Brain: The a medial right parietal parenchymal hematoma measures 3.7 x 2.0 cm, stable to minimally larger than on the prior CT with the apparent change possibly due to differences in modality. Mild  surrounding edema is similar to the prior CT, and there is no significant mass effect. No underlying enhancing mass is identified. There is a small vein coursing along the medial aspect of the hematoma, however no vascular nidus or enlarged draining vein is identified. This vein may be coursing within an effaced sulcus or could reflect a small developmental venous anomaly, with the former favored. No diffusion abnormality is seen around the hemorrhage to indicate an infarct. There is no evidence of acute or chronic intracranial hemorrhage elsewhere. Scattered small foci of T2 hyperintensity in the subcortical and deep cerebral white matter bilaterally are nonspecific but compatible with mild chronic small vessel ischemic disease. Minimal chronic small vessel changes are noted in the pons. There is mild cerebral atrophy. There is no midline shift or extra-axial fluid collection. Vascular: Major intracranial vascular flow voids are preserved. Skull and upper cervical spine: Unremarkable bone marrow signal. Sinuses/Orbits: Bilateral cataract extraction. Moderate mucosal thickening  in the bilateral ethmoid and right maxillary sinuses. Small to moderate volume fluid in the right maxillary sinus. Small left mastoid effusion. Other: None. MRA HEAD FINDINGS The visualized distal vertebral arteries are patent to the basilar. Patent PICA, AICA, and SCA origins are identified bilaterally. The basilar artery is widely patent. There are medium sized posterior communicating arteries bilaterally. The PCAs are patent without evidence of significant stenosis. The internal carotid arteries are widely patent from skullbase to carotid termini. The ACAs and MCAs are patent without evidence of proximal branch occlusion or significant proximal stenosis. An aneurysm projecting anterosuperiorly in the anterior communicating region measures 4 x 2 mm. The MRA did not extend completely through the right parietal hemorrhage, however no abnormal  arterial structures are seen along the inferior aspect of the hemorrhage. IMPRESSION: 1. 3.7 cm right parietal hemorrhage with unchanged mild surrounding edema and no mass effect. No surrounding infarct, underlying mass, or definite vascular malformation identified. No chronic hemorrhages to suggest cerebral amyloid angiopathy. 2. Mild chronic small vessel ischemic disease. 3. Patent circle of Willis without proximal branch occlusion or significant stenosis. 4. 4 mm anterior communicating aneurysm. Electronically Signed   By: Logan Bores M.D.   On: 06/24/2017 11:41     Assessment/Plan: Diagnosis: Right posterior parietal intraparenchymal hemorrhage with gait disorder and mild left hemiparesis. 1. Does the need for close, 24 hr/day medical supervision in concert with the patient's rehab needs make it unreasonable for this patient to be served in a less intensive setting? Yes 2. Co-Morbidities requiring supervision/potential complications: Diabetes, hyperlipidemia, hypertension 3. Due to bladder management, bowel management, safety, skin/wound care, disease management, medication administration, pain management and patient education, does the patient require 24 hr/day rehab nursing? Yes 4. Does the patient require coordinated care of a physician, rehab nurse, PT (1-2 hrs/day, 5 days/week) and OT (1-2 hrs/day, 5 days/week) to address physical and functional deficits in the context of the above medical diagnosis(es)? Yes Addressing deficits in the following areas: balance, endurance, locomotion, strength, transferring, bowel/bladder control, bathing, dressing, feeding, grooming, toileting and psychosocial support 5. Can the patient actively participate in an intensive therapy program of at least 3 hrs of therapy per day at least 5 days per week? Yes 6. The potential for patient to make measurable gains while on inpatient rehab is excellent 7. Anticipated functional outcomes upon discharge from inpatient  rehab are modified independent  with PT, modified independent with OT, n/a with SLP. 8. Estimated rehab length of stay to reach the above functional goals is: 10-14 days 9. Anticipated D/C setting: Home 10. Anticipated post D/C treatments: [place] therapy 11. Overall Rehab/Functional Prognosis: excellent  RECOMMENDATIONS: This patient's condition is appropriate for continued rehabilitative care in the following setting: CIR Patient has agreed to participate in recommended program. Yes Note that insurance prior authorization may be required for reimbursement for recommended care.  Comment:   Charlett Blake M.D. [facility] FAAPM&R (Sports Med, Neuromuscular Med) Diplomate Am Board of Electrodiagnostic Med  Elizabeth Sauer 06/24/2017          Revision History                        Routing History

## 2017-06-29 ENCOUNTER — Inpatient Hospital Stay (HOSPITAL_COMMUNITY): Payer: Self-pay | Admitting: Physical Therapy

## 2017-06-29 ENCOUNTER — Inpatient Hospital Stay (HOSPITAL_COMMUNITY): Payer: Worker's Compensation

## 2017-06-29 ENCOUNTER — Telehealth: Payer: Self-pay | Admitting: *Deleted

## 2017-06-29 ENCOUNTER — Inpatient Hospital Stay (HOSPITAL_COMMUNITY): Payer: Self-pay | Admitting: Occupational Therapy

## 2017-06-29 ENCOUNTER — Inpatient Hospital Stay (HOSPITAL_COMMUNITY): Payer: Self-pay

## 2017-06-29 DIAGNOSIS — S06301S Unspecified focal traumatic brain injury with loss of consciousness of 30 minutes or less, sequela: Secondary | ICD-10-CM

## 2017-06-29 DIAGNOSIS — M7989 Other specified soft tissue disorders: Secondary | ICD-10-CM

## 2017-06-29 DIAGNOSIS — R7989 Other specified abnormal findings of blood chemistry: Secondary | ICD-10-CM

## 2017-06-29 DIAGNOSIS — E119 Type 2 diabetes mellitus without complications: Secondary | ICD-10-CM

## 2017-06-29 LAB — GLUCOSE, CAPILLARY
Glucose-Capillary: 174 mg/dL — ABNORMAL HIGH (ref 65–99)
Glucose-Capillary: 283 mg/dL — ABNORMAL HIGH (ref 65–99)
Glucose-Capillary: 143 mg/dL — ABNORMAL HIGH (ref 65–99)
Glucose-Capillary: 253 mg/dL — ABNORMAL HIGH (ref 65–99)

## 2017-06-29 LAB — CBC WITH DIFFERENTIAL/PLATELET
Basophils Absolute: 0 10*3/uL (ref 0.0–0.1)
Basophils Relative: 0 %
Eosinophils Absolute: 0.4 10*3/uL (ref 0.0–0.7)
Eosinophils Relative: 6 %
HCT: 35.5 % — ABNORMAL LOW (ref 39.0–52.0)
Hemoglobin: 11.4 g/dL — ABNORMAL LOW (ref 13.0–17.0)
Lymphocytes Relative: 24 %
Lymphs Abs: 1.7 10*3/uL (ref 0.7–4.0)
MCH: 27.5 pg (ref 26.0–34.0)
MCHC: 32.1 g/dL (ref 30.0–36.0)
MCV: 85.5 fL (ref 78.0–100.0)
Monocytes Absolute: 0.6 10*3/uL (ref 0.1–1.0)
Monocytes Relative: 9 %
Neutro Abs: 4.3 10*3/uL (ref 1.7–7.7)
Neutrophils Relative %: 61 %
Platelets: 202 10*3/uL (ref 150–400)
RBC: 4.15 MIL/uL — ABNORMAL LOW (ref 4.22–5.81)
RDW: 13.2 % (ref 11.5–15.5)
WBC: 7 10*3/uL (ref 4.0–10.5)

## 2017-06-29 LAB — COMPREHENSIVE METABOLIC PANEL
ALT: 27 U/L (ref 17–63)
AST: 21 U/L (ref 15–41)
Albumin: 3.4 g/dL — ABNORMAL LOW (ref 3.5–5.0)
Alkaline Phosphatase: 72 U/L (ref 38–126)
Anion gap: 7 (ref 5–15)
BUN: 31 mg/dL — ABNORMAL HIGH (ref 6–20)
CO2: 29 mmol/L (ref 22–32)
Calcium: 9.3 mg/dL (ref 8.9–10.3)
Chloride: 98 mmol/L — ABNORMAL LOW (ref 101–111)
Creatinine, Ser: 1.22 mg/dL (ref 0.61–1.24)
GFR calc Af Amer: >60 mL/min (ref >60)
GFR calc non Af Amer: 55 mL/min — ABNORMAL LOW (ref >60)
Glucose, Bld: 236 mg/dL — ABNORMAL HIGH (ref 65–99)
Potassium: 4.3 mmol/L (ref 3.5–5.1)
Sodium: 134 mmol/L — ABNORMAL LOW (ref 135–145)
Total Bilirubin: 0.7 mg/dL (ref 0.3–1.2)
Total Protein: 6.9 g/dL (ref 6.5–8.1)

## 2017-06-29 MED ORDER — GLUCERNA SHAKE PO LIQD
237.0000 mL | Freq: Two times a day (BID) | ORAL | Status: DC
  Administered 2017-06-29 – 2017-07-09 (×19): 237 mL via ORAL

## 2017-06-29 NOTE — Evaluation (Signed)
Physical Therapy Assessment and Plan  Patient Details  Name: Frank [name] MRN: [id number] Date of Birth: [birth year]  PT Diagnosis: Abnormal posture, Difficulty walking and Muscle weakness Rehab Potential: Good ELOS: 10-12   Today's Date: 06/29/2017 PT Individual Time: 1000-1100 PT Individual Time Calculation (min): 60 min    Problem List:  Patient Active Problem List   Diagnosis Date Noted  . Traumatic intracranial hemorrhage ([place]) 06/28/2017  . H/O clavicle fracture   . Depression   . Benign essential HTN   . Type 2 diabetes mellitus with peripheral neuropathy ([name])   . Hyperlipidemia   . Intracranial hematoma following injury ([place]) 06/23/2017  . ICH (intracerebral hemorrhage) ([place]) 06/23/2017  . Morbid obesity ([place]) 05/12/2017  . OAB (overactive bladder) 11/26/2014  . Routine general medical examination at a health care facility 04/06/2014  . Vitamin D deficiency 07/23/2010  . Obstructive sleep apnea 09/13/2008  . Hyperlipidemia with target LDL less than 100 07/27/2007  . BPH (benign prostatic hyperplasia) 06/21/2007  . 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 Type II diabetes mellitus with manifestations ([place]) 04/15/2006  . B12 deficiency anemia 04/15/2006  . Essential hypertension 04/15/2006  . ALLERGIC RHINITIS 04/15/2006    Past Medical History:  Past Medical History:  Diagnosis Date  . Allergy    Rhinitis  . Anemia    NOS iron deficient and B12 deficient  . Arthritis   . Diabetes mellitus    Type 2  . GERD (gastroesophageal reflux disease)   . Hyperlipidemia   . Hypertension   . 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 OSA (obstructive sleep apnea)    cpap   Past Surgical History:  Past Surgical History:  Procedure Laterality Date  . APPENDECTOMY    . arm fracture Left 2011   with hardware  . COLONOSCOPY    . ESOPHAGOGASTRODUODENOSCOPY  2006   gastritis  . ROTATOR CUFF REPAIR    . SEPTOPLASTY  1959   Deviated Septum  . 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 year old male with history of diabetes mellitus, hyperlipidemia, hypertension, right shoulder chronic rotator cuff tear followed by Dr. Noemi Chapel as well as chronic right distal clavicle fracture. Per chart review and patient, presented 06/23/2017 with multiple falls over this past year and latest fall stating he slipped on the ice after becoming dizzy. Patient lives with spouse reported to be independent prior to admission. One level home with 4 steps to entry. Cranial CT scan reviewed, showing right parietal hemorrhage.  per report, area of hemorrhage in the posterior right parietal lobe measuring 3.2 x 1.8 cm. No significant mass effect or midline shift. Cervical spine films negative. Follow-up MRI identifies a 3.7 cm right parietal hemorrhage with unchanged mild surrounding edema and no mass effect. 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 Echocardiogram with ejection fraction of 57% grade 1 diastolic dysfunction. Carotid Dopplers in no ICA stenosis. X-rays of sacrum coccyx 06/26/2017 showed question upper coccygeal nondisplaced fracture advise conservative care per orthopedic services. 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 Tolerating a regular diet. Physical and occupational therapy evaluations completed 06/24/2017 with recommendations of physical medicine rehabilitation consult. Patient was admitted for a comprehensive rehabilitation program   Patient transferred to CIR on 06/28/2017 .   Patient currently requires min with mobility secondary  to muscle weakness, impaired timing and sequencing and decreased coordination and decreased standing balance, decreased postural control and decreased balance strategies.  Prior to hospitalization, patient was  independent  with mobility and lived with Spouse in a House home.  Home access is 4Stairs to enter.  Patient will benefit from skilled PT intervention to maximize safe functional mobility, minimize fall risk and decrease caregiver burden for planned discharge intermittent supervision.  Anticipate patient will benefit from follow up OP at discharge.  PT - End of Session Activity Tolerance: Tolerates 30+ min activity with multiple rests;Decreased this session Endurance Deficit: Yes PT Assessment Rehab Potential (ACUTE/IP ONLY): Good PT Barriers to Discharge: Behavior PT Patient demonstrates impairments in the following area(s): Balance;Behavior;Motor;Perception;Safety;Sensory;Endurance PT Transfers Functional Problem(s): Bed Mobility;Bed to Chair;Car;Furniture PT Locomotion Functional Problem(s): Ambulation;Wheelchair Mobility;Stairs PT Plan PT Intensity: Minimum of 1-2 x/day ,45 to 90 minutes PT Frequency: 5 out of 7 days PT Duration Estimated Length of Stay: 10-12 PT Treatment/Interventions: Ambulation/gait training;Discharge planning;Functional mobility training;Therapeutic Activities;Visual/perceptual remediation/compensation;Balance/vestibular training;Disease management/prevention;Neuromuscular re-education;Therapeutic Exercise;Wheelchair propulsion/positioning;Cognitive remediation/compensation;Splinting/orthotics;UE/LE Strength taining/ROM;DME/adaptive equipment instruction;Community reintegration;Functional electrical stimulation;Patient/family education;Stair training;UE/LE Coordination activities PT Transfers Anticipated Outcome(s): supervision PT Locomotion Anticipated Outcome(s): supervsion  PT Recommendation Follow Up Recommendations: Outpatient PT Patient destination: Home Equipment Recommended: To be determined  Skilled Therapeutic Intervention Evaluation completed (see details above and below) with education on PT POC and goals and individual treatment initiated with focus on  functional mobility, fall precautions and balance. Pt seated EOB upon PT arrival, agreeable to therapy tx and denies pain. Therapist completed strength, sensation, coordination and balance testing as detailed below. Pt seated EOB donned pants and performed sit<>stand with min assist. Pt propelled w/c within room for increased independence in room with supervision. Pt ambulated x 100 ft without AD requiring mod assist. Pt ambulated on unlevel surfaces without AD requiring mod assist. Pt ascended/descended 4 steps with B handrails and mod assist, verbal cues for techniques. Pt completed berg balance assessment, during 360 turn item the pt lost his balance to the left and the therapist provided assist to lower to the ground. Pt reported feeling fine and denies any pain. This therapist and another therapist assisted him back up to a standing position. Pt ambulated x 150 ft using RW back to his room with min assist. Pt left seated EOB with needs in reach, hand off to RN for assessment.   PT Evaluation Precautions/Restrictions Precautions Precautions: Fall Restrictions Weight Bearing Restrictions: No General   Vital SignsTherapy Vitals Temp: (!) 97.4 F (36.3 C) Temp Source: Oral Pulse Rate: 78 Resp: 18 BP: (!) 96/54 Patient Position (if appropriate): Sitting Oxygen Therapy SpO2: 95 % O2 Device: Not Delivered Pain Pain Assessment Pain Assessment: No/denies pain Home Living/Prior Functioning Home Living Available Help at Discharge: Family;Available 24 hours/day Type of Home: House Home Access: Stairs to enter [employer] of Steps: 4 Entrance Stairs-Rails: Left;Right;Can reach both Home Layout: One level  Lives With: Spouse Prior Function Level of Independence: Independent with basic ADLs;Independent with gait  Able to Take Stairs?: Yes Driving: Yes Vocation: Full time employment Vocation Requirements: 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 and his wife works from home Leisure:  Hobbies-yes (Comment) Comments: enjoys traveling Cognition Overall Cognitive Status: Within Functional Limits for tasks assessed Arousal/Alertness: Awake/alert Orientation Level: Oriented X4 Attention: Sustained Sustained Attention: Appears intact Memory: Appears intact Awareness: Impaired Awareness Impairment: Anticipatory impairment Problem Solving: Appears intact Safety/Judgment: Impaired Sensation Sensation Light Touch: Appears Intact Proprioception: Appears Intact Additional Comments: intact sensation B LEs Coordination Gross Motor  Movements are Fluid and Coordinated: No Fine Motor Movements are Fluid and Coordinated: Yes Coordination and Movement Description: mild incoordination noted with functional mobility Heel Shin Test: [facility] Motor  Motor Motor - Skilled Clinical Observations: mild left inattention, bradykinesia noted during sit<>stands and turns  Trunk/Postural Assessment  Cervical Assessment Cervical Assessment: Exceptions to WFL(cervical protraction) Thoracic Assessment Thoracic Assessment: Exceptions to WFL(kyphotic) Lumbar Assessment Lumbar Assessment: Within Functional Limits Postural Control Postural Control: Deficits on evaluation Trunk Control: L lateral lean with narrow BOS Righting Reactions: poor Protective Responses: poor  Balance Balance Balance Assessed: Yes Standardized Balance Assessment Standardized Balance Assessment: Berg Balance Test Berg Balance Test Sit to Stand: Able to stand using hands after several tries Standing Unsupported: Able to stand 2 minutes with supervision Sitting with Back Unsupported but Feet Supported on Floor or Stool: Able to sit safely and securely 2 minutes Stand to Sit: Controls descent by using hands Transfers: Able to transfer safely, definite need of hands Standing Unsupported with Eyes Closed: Able to stand 10 seconds with supervision Standing Ubsupported with Feet Together: Needs help to attain position and  unable to hold for 15 seconds From Standing, Reach Forward with Outstretched Arm: Can reach forward >5 cm safely (2") From Standing Position, Pick up Object from Floor: Unable to pick up shoe, but reaches 2-5 cm (1-2") from shoe and balances independently From Standing Position, Turn to Look Behind Over each Shoulder: Turn sideways only but maintains balance Turn 360 Degrees: Needs assistance while turning Standing Unsupported, Alternately Place Feet on Step/Stool: Needs assistance to keep from falling or unable to try Standing Unsupported, One Foot in Front: Loses balance while stepping or standing Standing on One Leg: Unable to try or needs assist to prevent fall Total Score: 24 Static Sitting Balance Static Sitting - Level of Assistance: 5: Stand by assistance Dynamic Sitting Balance Dynamic Sitting - Level of Assistance: 5: Stand by assistance Static Standing Balance Static Standing - Level of Assistance: 4: Min assist Dynamic Standing Balance Dynamic Standing - Level of Assistance: 3: Mod assist(without AD) Extremity Assessment      RLE Assessment RLE Assessment: Exceptions to WFL(strength grossly 4/5 throughout) LLE Assessment LLE Assessment: Exceptions to WFL(strength grossly 4/5 throughout)   See Function Navigator for Current Functional Status.   Refer to Care Plan for Long Term Goals  Recommendations for other services: None   Discharge Criteria: Patient will be discharged from PT if patient refuses treatment 3 consecutive times without medical reason, if treatment goals not met, if there is a change in medical status, if patient makes no progress towards goals or if patient is discharged from hospital.  The above assessment, treatment plan, treatment alternatives and goals were discussed and mutually agreed upon: by patient  Netta Corrigan, PT, DPT 06/29/2017, 11:01 AM

## 2017-06-29 NOTE — Progress Notes (Signed)
Physical Therapy Session Note  Patient Details  Name: Shaquel Chavous MRN: [id number] Date of Birth: 1941/03/29  Today's Date: 06/29/2017 PT Individual Time: 1040-1110 PT Individual Time Calculation (min): 30 min   Short Term Goals: Week 1:     Skilled Therapeutic Interventions/Progress Updates: Pt presented sitting EOB agreeable to therapy. Performed sit to stand with RW and minA from soft bed, ambulated to rehab gym with min guard and cues for maintaining straight trajectory, safe management of RW, and increasing L step length. Pt performed standing balance activities including horseshoe toss with use of LUE and reaching outside BOS and crossing midline with LUE. Pt noted to have poor righting reactions to L when performing far reach to L, requiring minA from PTA for correction. Pt performed sitting reaching to L via ball toss with pt able to reach to far L and return to midline. Pt instructed in safe use of hands performing sit/stand transfer and returned to room in same manner as prior with reinforcement of increasing L step length. Pt transferred to recliner chair at end of session with call bell within reach and LSW present.    Therapy Documentation Precautions:  Precautions Precautions: Fall Restrictions Weight Bearing Restrictions: No General:   Vital Signs: Therapy Vitals Temp: 97.6 F (36.4 C) Temp Source: Oral Pulse Rate: 77 Resp: 18 BP: (!) 103/56 Patient Position (if appropriate): Sitting Oxygen Therapy SpO2: 96 % O2 Device: Not Delivered Pain: Pain Assessment Pain Assessment: 0-10 Pain Score: 3  Pain Type: Acute pain Pain Location: Head Pain Orientation: Posterior Pain Descriptors / Indicators: Sore Pain Frequency: Intermittent Pain Intervention(s): Medication (See eMAR) Mobility:     See Function Navigator for Current Functional Status.   Therapy/Group: Individual Therapy  Arelis Neumeier  Clancey Welton, PTA  06/29/2017, 12:21 PM

## 2017-06-29 NOTE — Progress Notes (Signed)
   06/29/17 0950  What Happened  Was fall witnessed? Yes  Who witnessed fall? Raquel Sarna Schagen  Patients activity before fall during therapy  Was patient injured? No  Follow Up  MD notified Silvestre Mesi PA  Time MD notified 86  Family notified No- patient refusal (Family with patient in therapy)  Time family notified 0950  Additional tests No  Progress note created (see row info) Yes  Adult Fall Risk Assessment  Risk Factor Category (scoring not indicated) High fall risk per protocol (document High fall risk)  Patient's Fall Risk High Fall Risk (>13 points)  Adult Fall Risk Interventions  Required Bundle Interventions *See Row Information* High fall risk - low, moderate, and high requirements implemented  Additional Interventions PT/OT need assessed if change in mobility from baseline  Screening for Fall Injury Risk  Risk For Fall Injury- See Row Information  Bleeding Risk - anticoagulation (not prophylaxis)  Required Injury Bundle Interventions *See Row Information* Injury Bundle Implemented  Screening for Fall Injury Risk Interventions  Additional Interventions PT/OT consult

## 2017-06-29 NOTE — Care Management Note (Signed)
[facility] Individual Statement of Services  Patient Name:  Frank [name]  Date:  06/29/2017  Welcome to the [facility].  Our goal is to provide you with an individualized program based on your diagnosis and situation, designed to meet your specific needs.  With this comprehensive rehabilitation program, you will be expected to participate in at least 3 hours of rehabilitation therapies Monday-Friday, with modified therapy programming on the weekends.  Your rehabilitation program will include the following services:  Physical Therapy (PT), Occupational Therapy (OT), Speech Therapy (ST), 24 hour per day rehabilitation nursing, Neuropsychology, Case Management (Social Worker), Rehabilitation Medicine, Nutrition Services and Pharmacy Services  Weekly team conferences will be held on Wednesday to discuss your progress.  Your Social Worker will talk with you frequently to get your input and to update you on team discussions.  Team conferences with you and your family in attendance may also be held.  Expected length of stay: 10-12 days Overall anticipated outcome: mod/i-supervision level  Depending on your progress and recovery, your program may change. Your Social Worker will coordinate services and will keep you informed of any changes. Your Social Worker's name and contact numbers are listed  below.  The following services may also be recommended but are not provided by the [facility] will be made to provide these services after discharge if needed.  Arrangements include referral to agencies that provide these services.  Your insurance has been verified to be:  Workers Comp & UHC-Medicare Your primary doctor is:  Scarlette Calico  Pertinent information will be shared with your doctor and  your insurance company.  Social Worker:  Ovidio Kin, [place] or ([phone]  Information discussed with and copy given to patient by: [name], 06/29/2017, 10:40 AM

## 2017-06-29 NOTE — Progress Notes (Signed)
Nutrition Brief Note  Patient identified on the Malnutrition Screening Tool (MST) Report  Wt Readings from Last 15 Encounters:  06/28/17 198 lb 13.7 oz (90.2 kg)  06/23/17 207 lb 7.3 oz (94.1 kg)  05/12/17 217 lb 3.2 oz (98.5 kg)  05/04/17 216 lb (98 kg)  03/14/17 213 lb (96.6 kg)  02/01/17 211 lb (95.7 kg)  12/09/16 214 lb (97.1 kg)  08/31/16 217 lb (98.4 kg)  06/16/16 217 lb (98.4 kg)  05/06/16 219 lb 9.6 oz (99.6 kg)  03/18/16 216 lb (98 kg)  12/11/15 215 lb (97.5 kg)  10/31/15 213 lb (96.6 kg)  07/22/15 213 lb (96.6 kg)  07/10/15 213 lb (96.6 kg)    Body mass index is 28.53 kg/m. Patient meets criteria for overweight based on current BMI.   Current diet order is carbohydrate modified, patient is consuming approximately 100% of meals at this time. Pt reports having a good appetite currently and PTA with usual consumption of at least 2-3 meals a day with no other difficulties. Labs and medications reviewed. Pt currently has Ensure ordered and requests continuation of supplements. Pt does report concern of elevated blood sugar, thus Ensure will be modified to Glucerna shake.  No further nutrition interventions warranted at this time. If nutrition issues arise, please consult RD.   Frank Parker, MS, RD, LDN Pager # [phone] After hours/ weekend pager # [phone]

## 2017-06-29 NOTE — Progress Notes (Signed)
Patient information reviewed and entered into eRehab system by Leola Fiore, RN, CRRN, PPS Coordinator.  Information including medical coding and functional independence measure will be reviewed and updated through discharge.     Per nursing patient was given "Data Collection Information Summary for Patients in Inpatient Rehabilitation Facilities with attached "Privacy Act Statement-Health Care Records" upon admission.  

## 2017-06-29 NOTE — Progress Notes (Signed)
Social Work Assessment and Plan Social Work Assessment and Plan  Patient Details  Name: Frank [name] MRN: [id number] Date of Birth: 05-08-1941  Today's Date: 06/29/2017  Problem List:  Patient Active Problem List   Diagnosis Date Noted  . Traumatic intracranial hemorrhage ([place]) 06/28/2017  . H/O clavicle fracture   . Depression   . Benign essential HTN   . Type 2 diabetes mellitus with peripheral neuropathy ([name])   . Hyperlipidemia   . Intracranial hematoma following injury ([place]) 06/23/2017  . ICH (intracerebral hemorrhage) ([place]) 06/23/2017  . Morbid obesity ([place]) 05/12/2017  . OAB (overactive bladder) 11/26/2014  . Routine general medical examination at a health care facility 04/06/2014  . Vitamin D deficiency 07/23/2010  . Obstructive sleep apnea 09/13/2008  . Hyperlipidemia with target LDL less than 100 07/27/2007  . BPH (benign prostatic hyperplasia) 06/21/2007  . 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 Type II diabetes mellitus with manifestations ([place]) 04/15/2006  . B12 deficiency anemia 04/15/2006  . Essential hypertension 04/15/2006  . ALLERGIC RHINITIS 04/15/2006   Past Medical History:  Past Medical History:  Diagnosis Date  . Allergy    Rhinitis  . Anemia    NOS iron deficient and B12 deficient  . Arthritis   . Diabetes mellitus    Type 2  . GERD (gastroesophageal reflux disease)   . Hyperlipidemia   . Hypertension   . 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 OSA (obstructive sleep apnea)    cpap   Past Surgical History:  Past Surgical History:  Procedure Laterality Date  . APPENDECTOMY    . arm fracture Left 2011   with hardware  . COLONOSCOPY    . ESOPHAGOGASTRODUODENOSCOPY  2006   gastritis  . ROTATOR CUFF REPAIR    . SEPTOPLASTY  1959   Deviated Septum  . 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 He smoked 1.00 pack per day. he has never used smokeless tobacco. He  reports that he drinks about 1.0 oz of alcohol per week. He reports that he does not use drugs.  Family / Support Systems Marital Status: Married Patient Roles: Spouse, Parent, Other (Comment)(Employee) Spouse/Significant Other: Frank [name] [phone] Children: Frank [name] [phone]  Frank [name] [phone] Other Supports: Friends and co-workers Anticipated Caregiver: Wife Ability/Limitations of Caregiver: Wife is in good health and works from home Caregiver Availability: 24/7 Family Dynamics: Close knit family son lives locally and daughter has recently gone back to [place] after checking on Dad. Pt has always been very independent and taken care of himself and he plans on doing this again once discharged from here.  Social History Preferred language: English Religion: Jewish Cultural Background: No issues Education: Some Data processing manager: Yes Write: Yes Employment Status: Employed Name of Employer: Nurse, children's Return to Work Plans: Plans to return to work Freight forwarder Issues: No issues Guardian/Conservator: None-according to MD pt is capable of making his decisions while here   Abuse/Neglect Abuse/Neglect Assessment Can Be Completed: Yes Physical Abuse: Denies Verbal Abuse: Denies Sexual Abuse: Denies Exploitation of patient/patient's resources: Denies Self-Neglect: Denies  Emotional Status Pt's affect, behavior adn adjustment status: Pt has always been independent and taken pride in being this way. He is not one that sits idle and plans to be acitve when he leaves here and back to work. He feels improves his quality of life and makes him happy. His wife is very active also Recent  Psychosocial Issues: has fallen a few times in the last three months-feels other health issues are being managed by PCP Pyschiatric History: No issues-deferred depression screen due to seems to be coping appropriately and able to verbalize his  concerns. Will ask team if would benefit from seeing neuro-psych while here. Should be short length of stay due to high level Substance Abuse History: No issues  Patient / Family Perceptions, Expectations & Goals Pt/Family understanding of illness & functional limitations: Pt and wife can explain his bleed and deficits as a result. He is improving and this is encouraging to the both of them. The do talk with the MD's and feel they have a good understanding of his treatment plan going forward. Premorbid pt/family roles/activities: Husband, father, grandfather, employee, church member, friend, etc Anticipated changes in roles/activities/participation: resume upon discharge Pt/family expectations/goals: Pt states: " I plan to be moving well and taking care of myself when I leave here."  Wife states: " I hope he is doing well and mobile, he doesn't want me to help him."  [employer]: None Premorbid Home Care/DME Agencies: Other (Comment)(has OP therapies in the past) Transportation available at discharge: Wife if pt is unable to at discharge Resource referrals recommended: Support group (specify)  Discharge Planning Living Arrangements: Spouse/significant other Support Systems: Spouse/significant other, Children, Friends/neighbors, Church/faith community Type of Residence: Private residence Insurance Resources: Multimedia programmer (specify)(Workers Comp and [employer]) Museum/gallery curator Resources: Employment, Fish farm manager, Family Support Financial Screen Referred: No Living Expenses: Own Money Management: Patient, Spouse Does the patient have any problems obtaining your medications?: No Home Management: Both he and wife Patient/Family Preliminary Plans: Return home with wif ewho is available to assist if needed. She does work from home three days a week. Pt wants to return to work also when able. Will await team's evaluations and work on discharge plans.  Wife plans to be  here daily to attend therapies with pt to see his progress. Social Work Anticipated Follow Up Needs: HH/OP, Support Group  Clinical Impression Pleasant gentleman who is motivated and ready to go to therapies and improve. He realizes his balance needs to be better and this is what he is working on while here. His wife and son are involved and ready to assist if needed. Will await therapy team evaluations and work on the discharge plan. Pt iss fairly high level and will not be here long.  Frank [name] 06/29/2017, 1:06 PM

## 2017-06-29 NOTE — Telephone Encounter (Signed)
   Pt on TCM l;ist admitted 06/23/17 for remote Hx of CVA who presents with an intra-parenchymal hematoma on CT head after a fall on the ice.  Saw neurologist and had additional testing, and was D/C 06/28/17 to CIR for for ongoing PT, OT and ST. Pt will f/u w/neurologist in 6 weeks...Johny Chess

## 2017-06-29 NOTE — Progress Notes (Signed)
[facility] PHYSICAL MEDICINE & REHABILITATION     PROGRESS NOTE    Subjective/Complaints: Had a good night. Pleased with activity tolerance today. No pain except for a mild headache  ROS: pt denies nausea, vomiting, diarrhea, cough, shortness of breath or chest pain   Objective: Vital Signs: Blood pressure 133/72, pulse 69, temperature 97.6 F (36.4 C), temperature source Oral, resp. rate 20, height 5\' 10"  (1.778 m), weight 90.2 kg (198 lb 13.7 oz), SpO2 96 %. No results found. Recent Labs    06/28/17 0438 06/29/17 0759  WBC 7.7 7.0  HGB 10.4* 11.4*  HCT 33.1* 35.5*  PLT 189 202   Recent Labs    06/28/17 0438 06/29/17 0759  NA 136 134*  K 4.1 4.3  CL 99* 98*  GLUCOSE 176* 236*  BUN 24* 31*  CREATININE 1.15 1.22  CALCIUM 9.1 9.3   CBG (last 3)  Recent Labs    06/28/17 1632 06/28/17 2053 06/29/17 0738  GLUCAP 178* 217* 174*    Wt Readings from Last 3 Encounters:  06/28/17 90.2 kg (198 lb 13.7 oz)  06/23/17 94.1 kg (207 lb 7.3 oz)  05/12/17 98.5 kg (217 lb 3.2 oz)    Physical Exam:  Constitutional: He is oriented to person, place, and time. He appears well-developed.  Obese  HENT:  Head: Normocephalic and atraumatic.  Eyes: EOM are normal. Right eye exhibits no discharge. Left eye exhibits no discharge.  Neck: Normal range of motion. Neck supple. No thyromegaly present.  Cardiovascular: RRR without murmur. No JVD   Respiratory: CTA Bilaterally without wheezes or rales. Normal effort  GI: Soft. Bowel sounds are normal. He exhibits no distension.  Musculoskeletal: He exhibits no edema or tenderness.  Neurological: He is alert and oriented to person, place, and time. Normal insight and awarenes Motor:  RUE?RLE: 5/5 proximal to distal  LUE/LLE: 4+/5 prox to distal. Decr FMC.  Skin: Skin is warm and dry.  Psychiatric: pleasant and appropriate    Assessment/Plan: 1. Functional and mobility deficits secondary to right posterior parietal IPH which require  3+ hours per day of interdisciplinary therapy in a comprehensive inpatient rehab setting. Physiatrist is providing close team supervision and 24 hour management of active medical problems listed below. Physiatrist and rehab team continue to assess barriers to discharge/monitor patient progress toward functional and medical goals.  Function:  Bathing Bathing position      Bathing parts      Bathing assist        Upper Body Dressing/Undressing Upper body dressing                    Upper body assist        Lower Body Dressing/Undressing Lower body dressing                                  Lower body assist        Toileting Toileting   Toileting steps completed by patient: Adjust clothing prior to toileting, Performs perineal hygiene, Adjust clothing after toileting      Toileting assist Assist level: Set up/obtain supplies   Transfers Chair/bed transfer             Locomotion Ambulation           Wheelchair          Cognition Comprehension Comprehension assist level: Follows complex conversation/direction with extra time/assistive device  Expression Expression assist  level: Expresses complex 90% of the time/cues < 10% of the time  Social Interaction Social Interaction assist level: Interacts appropriately with others - No medications needed.  Problem Solving Problem solving assist level: Solves complex problems: With extra time  Memory     Medical Problem List and Plan: 1.  Gait disorder and mild left hemiparesis secondary to right posterior parietal intraparenchymal hemorrhage with reported history of multiple falls    -beginning therapies today 2.  DVT Prophylaxis/Anticoagulation: SCDs. Monitor for any signs of DVT. Check vascular study 3. Pain Management: Tylenol as needed 4. Mood: Prozac 20 mg daily. Provide emotional support 5. Neuropsych: This patient is capable of making decisions on his own behalf. 6. Skin/Wound Care: Routine  skin checks 7. Fluids/Electrolytes/Nutrition: Routine I&O's. I personally reviewed the patient's labs today.     -BUN/Cr trending up. Encourage fluids   -recheck bmet on Thursday 8. Hypertension. Norvasc 10 mg daily, Cardura 2 mg daily at bedtime, Avapro 150 mg twice a day. Monitor with increased mobility 9. Diabetes mellitus peripheral neuropathy. Hemoglobin A1c 7.2. SSI. Check blood sugars before meals and at bedtime. Patient on Glucotrol XL 10 mg daily, Januvia 100 mg daily, Glucophage 1000 mg twice a day prior to admission. Follow for CBG pattern 10. BPH. The triptan 5 mg twice a day. Check PVR 3 11. Hyperlipidemia. Lipitor 12. History of chronic right rotator cuff tear as well as right distal clavicle fracture. Conservative care. Weightbearing as tolerated. Shoulder sling for comfort as needed.    LOS (Days) 1 A [place] T, MD 06/29/2017 9:40 AM

## 2017-06-29 NOTE — Plan of Care (Signed)
Progressing

## 2017-06-29 NOTE — Evaluation (Signed)
Occupational Therapy Assessment and Plan  Patient Details  Name: Frank [name] MRN: [id number] Date of Birth: Nov 10, 1940  OT Diagnosis: abnormal posture, hemiplegia affecting non-dominant side and muscle weakness (generalized) Rehab Potential: Rehab Potential (ACUTE ONLY): Good ELOS: 10-12 days   Today's Date: 06/29/2017 OT Individual Time: 1300-1415 OT Individual Time Calculation (min): 75 min     Problem List:  Patient Active Problem List   Diagnosis Date Noted  . Traumatic intracranial hemorrhage ([place]) 06/28/2017  . H/O clavicle fracture   . Depression   . Benign essential HTN   . Type 2 diabetes mellitus with peripheral neuropathy ([name])   . Hyperlipidemia   . Intracranial hematoma following injury ([place]) 06/23/2017  . ICH (intracerebral hemorrhage) ([place]) 06/23/2017  . Morbid obesity ([place]) 05/12/2017  . OAB (overactive bladder) 11/26/2014  . Routine general medical examination at a health care facility 04/06/2014  . Vitamin D deficiency 07/23/2010  . Obstructive sleep apnea 09/13/2008  . Hyperlipidemia with target LDL less than 100 07/27/2007  . BPH (benign prostatic hyperplasia) 06/21/2007  . 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 Type II diabetes mellitus with manifestations ([place]) 04/15/2006  . B12 deficiency anemia 04/15/2006  . Essential hypertension 04/15/2006  . ALLERGIC RHINITIS 04/15/2006    Past Medical History:  Past Medical History:  Diagnosis Date  . Allergy    Rhinitis  . Anemia    NOS iron deficient and B12 deficient  . Arthritis   . Diabetes mellitus    Type 2  . GERD (gastroesophageal reflux disease)   . Hyperlipidemia   . Hypertension   . 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 OSA (obstructive sleep apnea)    cpap   Past Surgical History:  Past Surgical History:  Procedure Laterality Date  . APPENDECTOMY    . arm fracture Left 2011   with hardware  . COLONOSCOPY    . ESOPHAGOGASTRODUODENOSCOPY  2006   gastritis  .  ROTATOR CUFF REPAIR    . SEPTOPLASTY  1959   Deviated Septum  . 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 Noemi Chapel as well as chronic right distal clavicle fracture. Per chart review and patient, presented 06/23/2017 with multiple falls over this past year and latest fall stating he slipped on the ice after becoming dizzy. Patient lives with spouse reported to be independent prior to admission. One level home with 4 steps to entry. Cranial CT scan reviewed, showing right parietal hemorrhage.  per report, area of hemorrhage in the posterior right parietal lobe measuring 3.2 x 1.8 cm. No significant mass effect or midline shift. Cervical spine films negative. Follow-up MRI identifies a 3.7 cm right parietal hemorrhage with unchanged mild surrounding edema and no mass effect. 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 Echocardiogram with ejection fraction of 08% grade 1 diastolic dysfunction. Carotid Dopplers in no ICA stenosis. X-rays of sacrum coccyx 06/26/2017 showed question upper coccygeal nondisplaced fracture advise conservative care per orthopedic services. 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 Tolerating a regular diet. Physical and occupational therapy evaluations completed 06/24/2017 with recommendations of physical medicine rehabilitation consult. Patient was admitted for a comprehensive rehabilitation program.  Patient transferred to CIR on 06/28/2017 .  Patient currently requires mod with basic self-care skills secondary to muscle weakness, decreased cardiorespiratoy endurance, decreased coordination, decreased attention to  left, decreased safety awareness and decreased sitting balance, decreased standing balance, decreased postural control and decreased balance strategies.  Prior to hospitalization, patient could complete ADLs/IADLs with independent .  Patient will benefit from skilled intervention to decrease level of assist with basic self-care skills, increase independence with basic self-care skills and increase level of independence with iADL prior to discharge home with care partner.  Anticipate patient will require intermittent supervision and follow up outpatient.  OT - End of Session Activity Tolerance: Tolerates 10 - 20 min activity with multiple rests Endurance Deficit: Yes OT Assessment Rehab Potential (ACUTE ONLY): Good OT Patient demonstrates impairments in the following area(s): Balance;Perception;Safety;Sensory;Endurance;Motor OT Basic ADL's Functional Problem(s): Grooming;Dressing;Bathing;Toileting OT Transfers Functional Problem(s): Toilet;Tub/Shower OT Additional Impairment(s): None OT Plan OT Intensity: Minimum of 1-2 x/day, 45 to 90 minutes OT Frequency: 5 out of 7 days OT Duration/Estimated Length of Stay: 10-12 days OT Treatment/Interventions: Balance/vestibular training;Discharge planning;Functional electrical stimulation;Pain management;Self Care/advanced ADL retraining;Therapeutic Activities;UE/LE Coordination activities;Functional mobility training;Patient/family education;Therapeutic Exercise;Community reintegration;DME/adaptive equipment instruction;Neuromuscular re-education;Psychosocial support;UE/LE Strength taining/ROM OT Self Feeding Anticipated Outcome(s): Mod I OT Basic Self-Care Anticipated Outcome(s): Supervision-mod I OT Toileting Anticipated Outcome(s): Mod I OT Bathroom Transfers Anticipated Outcome(s): Supervision-mod I OT Recommendation Recommendations for Other Services: Therapeutic Recreation consult Therapeutic Recreation Interventions: Pet therapy;Outing/community  reintergration;Stress management Patient destination: Home Follow Up Recommendations: Outpatient OT Equipment Recommended: To be determined   Skilled Therapeutic Intervention Pt seen for OT eval and ADL bathing/dressing session. Pt sitting up in recliner upon arrival, agreeable to tx session. He ambulated throughout room with overall min A, required intermittent assist for RW management in functional context with VCs and assist to turn RW.  He gathered clothing items from drawers, bending to reach low drawer with min-mod steadying assist. He bathed seated on tub bench, requiring mod A standing balance when attempting to complete pericare/ buttock hygiene.  Pt with decreased safety awareness/ awareness of deficits, requiring VCs throughout for safety as pt attempting to stand on one leg in shower to wash foot, etc. He dressed seated on toilet, requiring assist to thread LE into pants leg and standing with steadying assist at RW to pull pants up. He completed grooming tasks standing at sink, pt with strong L lean in standing when fatigued, required VCs for awareness to lean and to self correct.  Pt left seated in w/c at end of session, all needs in reach. Education provided throughout session regarding role of OT, POC, IPR, OT/PT goals, hemi dressing technique and d/c planning.  OT Evaluation Precautions/Restrictions  Precautions Precautions: Fall Precaution Comments: left bias; R chronic clavicle fx Required Braces or Orthoses: Sling(Sling for comfort only, pt declines use) Restrictions Weight Bearing Restrictions: No General Chart Reviewed: Yes Additional Pertinent History: R chronic clavicle fx Pain Pain Assessment Pain Assessment: 0-10 Pain Score:No/ denies pain Home Living/Prior Functioning Home Living Family/patient expects to be discharged to:: Private residence Living Arrangements: Spouse/significant other Available Help at Discharge: Family, Available 24 hours/day Type of  Home: House Home Access: Stairs to enter [employer] of Steps: 4 Entrance Stairs-Rails: Left, Right, Can reach both Home Layout: One level Bathroom Shower/Tub: Multimedia programmer: Associate Professor Accessibility: Yes  Lives With: Spouse IADL History Current License: Yes Mode of Transportation: Car Occupation: Full time employment 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 [employer] and Hobbies: Sports Prior Function Level of Independence: Independent with  basic ADLs, Independent with gait, Independent with homemaking with ambulation, Independent with transfers  Able to Take Stairs?: Yes Driving: Yes Vocation: Full time employment Vocation Requirements: 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 and his wife works from home Leisure: Hobbies-yes (Comment) Comments: enjoys traveling ADL   Vision Baseline Vision/History: Wears glasses Wears Glasses: Reading only Patient Visual Report: No change from baseline Vision Assessment?: Yes Eye Alignment: Within Functional Limits Ocular Range of Motion: Within Functional Limits Perception  Perception: Impaired Inattention/Neglect: Impaired-to be further tested in functional context(Mild L inattention) Praxis Praxis: Intact Cognition Overall Cognitive Status: Within Functional Limits for tasks assessed Arousal/Alertness: Awake/alert Orientation Level: Person;Place;Situation Person: Oriented Place: Oriented Situation: Oriented Year: 2018 Month: December Day of Week: Correct Memory: Appears intact Immediate Memory Recall: Sock;Blue;Bed Memory Recall: Sock;Blue Memory Recall Sock: Without Cue Memory Recall Blue: Without Cue Attention: Sustained Sustained Attention: Appears intact Awareness: Impaired Awareness Impairment: Anticipatory impairment Problem Solving: Appears intact Safety/Judgment: Impaired Comments: Decreased awareness of deficits Sensation Sensation Light Touch: Appears  Intact Proprioception: Appears Intact Coordination Gross Motor Movements are Fluid and Coordinated: No Fine Motor Movements are Fluid and Coordinated: Yes Coordination and Movement Description: mild incoordination noted with functional mobility Finger Nose Finger Test: WFL Motor  Motor Motor: Hemiplegia Motor - Skilled Clinical Observations: mild left inattention, bradykinesia noted during sit<>stands and turns Mobility     Trunk/Postural Assessment  Cervical Assessment Cervical Assessment: Exceptions to WFL(Forward head) Thoracic Assessment Thoracic Assessment: Exceptions to WFL(Kyphotic) Lumbar Assessment Lumbar Assessment: Within Functional Limits Postural Control Postural Control: Deficits on evaluation Trunk Control: L lateral lean with narrow BOS Righting Reactions: Severly delayed to non-existent Protective Responses: poor  Balance Balance Balance Assessed: Yes Static Sitting Balance Static Sitting - Level of Assistance: 5: Stand by assistance Dynamic Sitting Balance Dynamic Sitting - Balance Support: During functional activity;Feet supported Dynamic Sitting - Level of Assistance: 4: Min assist Sitting balance - Comments: Sitting on toilet to dress Static Standing Balance Static Standing - Balance Support: No upper extremity supported;During functional activity Static Standing - Level of Assistance: 4: Min assist Dynamic Standing Balance Dynamic Standing - Balance Support: No upper extremity supported;During functional activity Dynamic Standing - Level of Assistance: 3: Mod assist Dynamic Standing - Comments: Standing to complete bathing/dressing tasks Extremity/Trunk Assessment RUE Assessment RUE Assessment: Within Functional Limits(Not formally assessed due to hx R clavicle fx; appears WFL during ADL tasks) LUE Assessment LUE Assessment: Within Functional Limits   See Function Navigator for Current Functional Status.   Refer to Care Plan for Long Term  Goals  Recommendations for other services: Therapeutic Recreation  Pet therapy, Kitchen group, Stress management and Outing/community reintegration   Discharge Criteria: Patient will be discharged from OT if patient refuses treatment 3 consecutive times without medical reason, if treatment goals not met, if there is a change in medical status, if patient makes no progress towards goals or if patient is discharged from hospital.  The above assessment, treatment plan, treatment alternatives and goals were discussed and mutually agreed upon: by patient  Monserrate Blaschke L 06/29/2017, 3:05 PM

## 2017-06-29 NOTE — Progress Notes (Signed)
*  PRELIMINARY RESULTS* Vascular Ultrasound Bilateral lower extremity venous duplex has been completed.  Preliminary findings: No evidence of deep vein thrombosis or baker's cysts bilaterally.   Everrett Coombe 06/29/2017, 4:15 PM

## 2017-06-30 ENCOUNTER — Inpatient Hospital Stay (HOSPITAL_COMMUNITY): Payer: Self-pay | Admitting: Occupational Therapy

## 2017-06-30 ENCOUNTER — Telehealth: Payer: Self-pay | Admitting: *Deleted

## 2017-06-30 ENCOUNTER — Inpatient Hospital Stay (HOSPITAL_COMMUNITY): Payer: Self-pay | Admitting: Physical Therapy

## 2017-06-30 LAB — GLUCOSE, CAPILLARY
Glucose-Capillary: 208 mg/dL — ABNORMAL HIGH (ref 65–99)
Glucose-Capillary: 233 mg/dL — ABNORMAL HIGH (ref 65–99)
Glucose-Capillary: 219 mg/dL — ABNORMAL HIGH (ref 65–99)
Glucose-Capillary: 116 mg/dL — ABNORMAL HIGH (ref 65–99)

## 2017-06-30 MED ORDER — GLIPIZIDE ER 10 MG PO TB24
10.0000 mg | ORAL_TABLET | Freq: Every day | ORAL | Status: DC
  Administered 2017-06-30 – 2017-07-09 (×10): 10 mg via ORAL
  Filled 2017-06-30 (×11): qty 1

## 2017-06-30 NOTE — Progress Notes (Signed)
[facility]IC[facility]     PROGRESS NOTE    Subjective/Complaints: Had a good night. Pleased with activity tolerance today. No pain except for a mild headache  ROS: pt denies nausea, vomiting, diarrhea, cough, shortness of breath or chest pain   Objective: Vital Signs: Blood pressure (!) 147/66, pulse 67, temperature 97.6 F (36.4 C), temperature source Oral, resp. rate 16, height 5\' 10"  (1.778 m), weight 90.2 kg (198 lb 13.7 oz), SpO2 96 %. No results found. Recent Labs    06/28/17 0438 06/29/17 0759  WBC 7.7 7.0  HGB 10.4* 11.4*  HCT 33.1* 35.5*  PLT 189 202   Recent Labs    06/28/17 0438 06/29/17 0759  NA 136 134*  K 4.1 4.3  CL 99* 98*  GLUCOSE 176* 236*  BUN 24* 31*  CREATININE 1.15 1.22  CALCIUM 9.1 9.3   CBG (last 3)  Recent Labs    06/29/17 1636 06/29/17 2101 06/30/17 0625  GLUCAP 143* 253* 208*    Wt Readings from Last 3 Encounters:  06/28/17 90.2 kg (198 lb 13.7 oz)  06/23/17 94.1 kg (207 lb 7.3 oz)  05/12/17 98.5 kg (217 lb 3.2 oz)    Physical Exam:  Constitutional: He is oriented to person, place, and time. He appears well-developed.  Obese  HENT:  Head: Normocephalic and atraumatic.  Eyes: EOM are normal. Right eye exhibits no discharge. Left eye exhibits no discharge.  Neck: Normal range of motion. Neck supple. No thyromegaly present.  Cardiovascular: RRR without murmur. No JVD   Respiratory:CTA Bilaterally without wheezes or rales. Normal effort  GI: Soft. Bowel sounds are normal. He exhibits no distension.  Musculoskeletal: He exhibits no edema or tenderness.  Neurological: He is alert and oriented to person, place, and time. Normal insight and awarenes Motor:  RUE?RLE: 5/5 proximal to distal  LUE/LLE: 4+/5 prox to distal.  No changes.  Good sitting balance.  Skin: Skin is warm and dry.  Psychiatric: pleasant and appropriate    Assessment/Plan: 1. Functional and mobility deficits secondary to right  posterior parietal IPH which require 3+ hours per day of interdisciplinary therapy in a comprehensive inpatient rehab setting. Physiatrist is providing close team supervision and 24 hour management of active medical problems listed below. Physiatrist and rehab team continue to assess barriers to discharge/monitor patient progress toward functional and medical goals.  Function:  Bathing Bathing position Bathing activity did not occur: Refused Position: Surveyor, mining parts bathed by helper: Back  Bathing assist Assist Level: Touching or steadying assistance(Pt > 75%)      Upper Body Dressing/Undressing Upper body dressing   What is the patient wearing?: Pull over shirt/dress     Pull over shirt/dress - Perfomed by patient: Thread/unthread right sleeve, Thread/unthread left sleeve, Put head through opening, Pull shirt over trunk          Upper body assist Assist Level: Set up, Supervision or verbal cues   Set up : To obtain clothing/put away  Lower Body Dressing/Undressing Lower body dressing   What is the patient wearing?: Underwear, Pants, Socks, Shoes Underwear - Performed by patient: Pull underwear up/down, Thread/unthread left underwear leg, Thread/unthread right underwear leg Underwear - Performed by helper: Thread/unthread right underwear leg Pants- Performed by patient: Thread/unthread right pants leg, Thread/unthread left pants leg, Pull pants up/down Pants- Performed by helper: Thread/unthread right pants leg   Non-skid slipper socks- Performed by helper: Don/doff right sock, Don/doff left sock Socks - Performed  by patient: Don/doff right sock, Don/doff left sock   Shoes - Performed by patient: Don/doff right shoe, Don/doff left shoe, Fasten right Shoes - Performed by helper: Fasten left          Lower body assist Assist for lower body dressing: Touching or steadying assistance (Pt > 75%)      Toileting Toileting   Toileting steps completed by  patient: Adjust clothing prior to toileting, Performs perineal hygiene, Adjust clothing after toileting   Toileting Assistive Devices: Grab bar or rail  Toileting assist Assist level: Touching or steadying assistance (Pt.75%)   Transfers Chair/bed transfer   Chair/bed transfer method: Stand pivot Chair/bed transfer assist level: Moderate assist (Pt 50 - 74%/lift or lower) Chair/bed transfer assistive device: Medical sales representative     Max distance: 150 ft Assist level: Moderate assist (Pt 50 - 74%)   Wheelchair          Cognition Comprehension Comprehension assist level: Follows complex conversation/direction with extra time/assistive device  Expression Expression assist level: Expresses complex 90% of the time/cues < 10% of the time  Social Interaction Social Interaction assist level: Interacts appropriately with others with medication or extra time (anti-anxiety, antidepressant).  Problem Solving Problem solving assist level: Solves complex 90% of the time/cues < 10% of the time  Memory Memory assist level: Recognizes or recalls 90% of the time/requires cueing < 10% of the time   Medical Problem List and Plan: 1.  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  OT did report some loss of balance during functional tasks of assessment    -Team conference today 2.  DVT Prophylaxis/Anticoagulation: SCDs. Monitor for any signs of DVT. Check vascular study 3. Pain Management: Tylenol as needed 4. Mood: Prozac 20 mg daily. Provide emotional support 5. Neuropsych: This patient is capable of making decisions on his own behalf. 6. Skin/Wound Care: Routine skin checks 7. Fluids/Electrolytes/Nutrition: BUN and creatinine trending up   -Reviewed fluid intake with patient this morning   -recheck bmet on Thursday 8. Hypertension.  Norvasc 10 mg daily, Cardura 2 mg daily at bedtime, Avapro 150 mg twice a day. Monitor with increased mobility   -Reasonable control at present 9. Diabetes mellitus peripheral neuropathy. Hemoglobin A1c 7.2. SSI. Check blood sugars before meals and at bedtime. Patient on Glucotrol XL 10 mg daily, Januvia 100 mg daily, Glucophage 1000 mg twice a day prior to admission.   -Resume Glucotrol XL today     CBG (last 3)  Recent Labs    06/29/17 1636 06/29/17 2101 06/30/17 0625  GLUCAP 143* 253* 208*    10. BPH. The triptan 5 mg twice a day.  Emptying bladder 11. Hyperlipidemia. Lipitor 12. History of chronic right rotator cuff tear as well as right distal clavicle fracture. Conservative care. Weightbearing as tolerated. Shoulder sling for comfort as needed   -Does not appear to be a major issue at present   LOS (Days) 2 A [place] T, MD 06/30/2017 8:47 AM

## 2017-06-30 NOTE — Progress Notes (Signed)
Occupational Therapy Session Note  Patient Details  Name: Frank [name] MRN: [id number] Date of Birth: July 27, 1940  Today's Date: 06/29/2017 OT Individual Time:  - 1430-15:00    Skilled Therapeutic Interventions/Progress Updates:    1:1 focus on activity tolerance and functional mobility throughout session.  Ambulated from room to gym with min guard with RW at slow speed and at times not always picking up left LE. In gym focus on sit to stands without UE support with control and then progressed to using a step under right LE to encourage more awareness and strengthening of Left LE. Functional ambulation without RW in the hallway with multiple head turns looking for items and 360 turns  (at slow speed).  PT able to ambulate at a more normal pace without the RW and with min A. Returned to home and left in w/c with wife present.   Therapy Documentation Precautions:  Precautions Precautions: Fall Precaution Comments: left bias; R chronic clavicle fx Required Braces or Orthoses: Sling(Sling for comfort only, pt declines use) Restrictions Weight Bearing Restrictions: No   Pain: 0/10 See Function Navigator for Current Functional Status.   Therapy/Group: Individual Therapy  Willeen Cass [facility] 06/30/2017, 1:25 AM

## 2017-06-30 NOTE — Patient Care Conference (Signed)
Inpatient RehabilitationTeam Conference and Plan of Care Update Date: 06/30/2017   Time: 10:50 AM    Patient Name: Frank [name]      Medical Record Number: [id number]  Date of Birth: [birth year] Sex: Male         Room/Bed: 4W25C/4W25C-01 Payor Info: Payor: [employer] MEDICARE / Plan: [facility] MEDICARE / Product Type: *No Product type* /    Admitting Diagnosis: Fall  Admit Date/Time:  06/28/2017  1:46 PM Admission Comments: No comment available   Primary Diagnosis:  <principal problem not specified> Principal Problem: <principal problem not specified>  Patient Active Problem List   Diagnosis Date Noted  . Traumatic intracranial hemorrhage (Frank [name]) 06/28/2017  . H/O clavicle fracture   . Depression   . Benign essential HTN   . Type 2 diabetes mellitus with peripheral neuropathy ([name])   . Hyperlipidemia   . Intracranial hematoma following injury (Frank [name]) 06/23/2017  . ICH (intracerebral hemorrhage) (Frank [name]) 06/23/2017  . Morbid obesity (Frank [name]) 05/12/2017  . OAB (overactive bladder) 11/26/2014  . Routine general medical examination at a health care facility 04/06/2014  . Vitamin D deficiency 07/23/2010  . Obstructive sleep apnea 09/13/2008  . Hyperlipidemia with target LDL less than 100 07/27/2007  . BPH (benign prostatic hyperplasia) 06/21/2007  . 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 Type II diabetes mellitus with manifestations (Frank [name]) 04/15/2006  . B12 deficiency anemia 04/15/2006  . Essential hypertension 04/15/2006  . ALLERGIC RHINITIS 04/15/2006    Expected Discharge Date: Expected Discharge Date: 07/09/17  Team Members Present: Physician leading conference: Dr. Delice Lesch Social Worker Present: Ovidio Kin, LCSW Nurse Present: Benjie Karvonen, RN PT Present: Phylliss Bob, PTA;Barrie Folk, PT OT Present: Napoleon Form, OT SLP Present: Charolett Bumpers, SLP PPS Coordinator present : Daiva Nakayama, RN, Frank [name]     Current Status/Progress Goal Weekly Team Focus   Medical   Patient admitted with left parietal hemorrhage after fall.  Patient with mild prerenal azotemia and  Maximize of capacity to participate in therapy  Normalized blood sugars and improve nutritional/hydration status   Bowel/Bladder   continent of b/b LBM 12/18  normal bowel and bladder function free of constipation  timed toileting    Swallow/Nutrition/ Hydration             ADL's   Min A overall with VCs for safety awarenress  Mod I overall, supervision shower stall transfer  ADL re-training, neuro re-ed, functional standing balance and endurance   Mobility   minA/min guard transfers, min guard gait with RW up to 176ft, por dynamic balance with poor righting reactions to L  Mod I- supervision  dynamic balance, L NMR, safety awareness, gait with LRAD   Communication             Safety/Cognition/ Behavioral Observations            Pain   c/o headache tylenol prn with relief  pt will have pain score <=2  assess pain q shift and prn assess for relief after analgesics administered   Skin   abrasion ot posterior head   healing abrasion and no new skin breakdown  assess skin q shift and prn      *See Care Plan and progress notes for long and short-term goals.     Barriers to Discharge  Current Status/Progress Possible Resolutions Date Resolved   Physician    Medical stability        Treat the above      Nursing  PT  Behavior                 OT                  SLP                SW                Discharge Planning/Teaching Needs:  Home with wife who can provide assist if needed. Pt very motivated to recover and remain independent.      Team Discussion:  Goals of supervision-mod/i level. Working on balance, dizzy this am with PT. Berg 25/56. Safety awareness needs to work on and be aware of. BP controlled and DM-re-started oral meds per MD.  Revisions to Treatment Plan:  DC 12/28    Continued Need for Acute Rehabilitation Level of Care: The  patient requires daily medical management by a physician with specialized training in physical medicine and rehabilitation for the following conditions: Daily direction of a multidisciplinary physical rehabilitation program to ensure safe treatment while eliciting the highest outcome that is of practical value to the patient.: Yes Daily medical management of patient stability for increased activity during participation in an intensive rehabilitation regime.: Yes Daily analysis of laboratory values and/or radiology reports with any subsequent need for medication adjustment of medical intervention for : Neurological problems;Other;Diabetes problems  Frank [name] 06/30/2017, 2:06 PM

## 2017-06-30 NOTE — Progress Notes (Signed)
Physical Therapy Session Note  Patient Details  Name: Frank [name] MRN: [id number] Date of Birth: September 24, 1940  Today's Date: 06/30/2017 PT Individual Time: 0850-1000AND 1430-1530 PT Individual Time Calculation (min): 70 min 60 min   Short Term Goals: Week 1:  PT Short Term Goal 1 (Week 1): Pt will ambulated x 150 ft with LRAD and supervision assist PT Short Term Goal 2 (Week 1): Pt will perform sit<>stands with supervision  PT Short Term Goal 3 (Week 1): Pt will improve balance by demonstrating berg balance test score of at least 35/56   Skilled Therapeutic Interventions/Progress Updates:   Pt received sitting in [facility] and agreeable to PT  PT transpoerted pt to rehab gym in [facility]  Gait training with RW and no AD 2x166f each. Min guard-supevrision with RW. Min assist- min guard without AD. Pt noted to have increased step length and height with no AD compared to gait with RW.  Dynamic gait training without AD Weave through 8 cones x 2 min assist from PT.   Reciprocal foot taps on 4 inch step. Min-mod assist from PT to prevent posterior lOB and faciliate tr lateral weight shifting to to improve LLE step height.   Dynamic standing balance to complete 2 low difficulty peg board puzzle, standing on 3 inch wedge. Mod assist from PT to prevent posterior LOB. Pt noted to have slight disorientation to midline  Reaching task to the R to force weight shifting to the R. Poor trunk rotiation noted with increased fatigue.   Throughout treatment, pt instructed in sit<>stand and stand pivot transfers with supervision assist and cues for UE placement and improved step length on the L to prevent LOB.   Gait back to room with min guard and no AD.   [facility] and [facility] needs met.     Session 2: Pt received sitting in WC and agreeable to PT PT provided pt with hybrid Roho cushion to reduce pressure on sacrum and improved sitting tolerance  and posture. [facility] Gait without AD [id number] CGA-supervision assist and min-mod cues for step height and improved safety in turns. Variable gait training in parallel bars. Forward/retrogait with BUE support [id number] and without UE support [id number] each direction. Side stepping with UE support [id number] bilatreally. Moderate cues for improved symmetry of movement  Reciprocal mini lunges to targets R and L. BUE support on parallel bars. Moderate cues for improved weight shifting and increased step length/height in the LLE. PT instructed pt in stair training with min assist overall and mod assist x1 to ascend descend 8 steps + up/down 1 step x6 . Moderate cues for step to gait pattern and improved awareness of the LUE.  [facility] returned to room and [facility] and all needs met.               Therapy Documentation Precautions:  Precautions Precautions: Fall Precaution Comments: left bias; R chronic clavicle fx Required Braces or Orthoses: Sling(Sling for comfort only, pt declines use) Restrictions Weight Bearing Restrictions: No Vital Signs: Therapy Vitals Pulse Rate: 71 Resp: 16 BP: (!) 115/52 Patient Position (if appropriate): Sitting Oxygen Therapy SpO2: 97 % O2 Device: Not Delivered Pain: Pain Assessment Pain Assessment: No/denies pain   See Function Navigator for Current Functional Status.   Therapy/Group: Individual Therapy  AustiLorie Phenix9/2018, 10:04  AM  

## 2017-06-30 NOTE — Plan of Care (Signed)
Pt takes tylenol for headache with relief Continent of b/b Follow plan for safety

## 2017-06-30 NOTE — Telephone Encounter (Signed)
Pt was on TCM list admitted 06/23/17 for Intracranial hematoma following injury,   ICH (intracerebral hemorrhage) ([place]) remote Hx of CVA who presents with an intra-parenchymal hematoma on CT head after a fall on the ice. Pt was D/c to CIR on 06/28/17. Will be f/u w/neurologist in 6 weeks...Johny Chess

## 2017-06-30 NOTE — Progress Notes (Signed)
Pt express concerns of CBG levels being high. Pt was asked, "What is being done different here, than how he control his blood sugars at home?" Pt noted he takes Metformin 1000mg  bid, Januvia 100mg  and I eat less at home. Educated pt on the changes in his diet, along with the amount of consumption contribute to his changes of blood sugar levels and his doctor is regulate his CBG by usage sliding scale.

## 2017-06-30 NOTE — Progress Notes (Signed)
Occupational Therapy Session Note  Patient Details  Name: Frank [name] MRN: [id number] Date of Birth: 1940/11/22  Today's Date: 06/30/2017 OT Individual Time: 8756-4332 OT Individual Time Calculation (min): 60 min    Short Term Goals: Week 1:  OT Short Term Goal 1 (Week 1): Pt will complete grooming tasks from standing position with supervision OT Short Term Goal 2 (Week 1): Pt will bathe sit <> stand with min steadying assist OT Short Term Goal 3 (Week 1): Pt will utilize RW during functional ambulation and sit <> stands safely with VCs only OT Short Term Goal 4 (Week 1): Pt will complete 3/3 toileting tasks with guarding assist  Skilled Therapeutic Interventions/Progress Updates:    Pt seen for OT session focusingn on ADL re-training. Pt in supine upon arrival, easily awoken and agreeable to tx session. Hospital bed placed in neutral position and bed rails removed to simulate home envirnment. Pt with decreased problem solving ability to plan and execute bed mobility coming to sitting EOB, ultimately requiring max cuing and mod A. Pt ate breakfast seated EOB. He ambulated thorughout room with min HHA, however, required VCs not to reach out for furniture for support and awareness to obstacles on L side. He wlaked into bathroom and completed toileting task with steadying assist, mod A for controlled descent onto toilet.  Exited bathroom ambulating with RW, decreased ambulation speed, however, not requiring VCs. He dressed seated EOB with min A to stand and pull pants up. Required min A and VCs for problem solving positioning in order to reach feet to don B socks/shoes and fasten. Pt returned to w/c at end of session, left set-up with rest of breakfast tray and all needs in reach. Throughout session, education provided regarding OT goals, POC, return to work, and d/c planning.   Therapy Documentation Precautions:  Precautions Precautions: Fall Precaution Comments: left bias; R  chronic clavicle fx Required Braces or Orthoses: Sling(Sling for comfort only, pt declines use) Restrictions Weight Bearing Restrictions: No Pain: Pain Assessment Pain Score: 0-No pain  See Function Navigator for Current Functional Status.   Therapy/Group: Individual Therapy  Daniil Labarge L 06/30/2017, 7:02 AM

## 2017-07-01 ENCOUNTER — Inpatient Hospital Stay (HOSPITAL_COMMUNITY): Payer: Self-pay | Admitting: Occupational Therapy

## 2017-07-01 ENCOUNTER — Inpatient Hospital Stay (HOSPITAL_COMMUNITY): Payer: Self-pay | Admitting: Physical Therapy

## 2017-07-01 LAB — GLUCOSE, CAPILLARY
Glucose-Capillary: 155 mg/dL — ABNORMAL HIGH (ref 65–99)
Glucose-Capillary: 204 mg/dL — ABNORMAL HIGH (ref 65–99)
Glucose-Capillary: 110 mg/dL — ABNORMAL HIGH (ref 65–99)
Glucose-Capillary: 175 mg/dL — ABNORMAL HIGH (ref 65–99)

## 2017-07-01 LAB — BASIC METABOLIC PANEL
Anion gap: 7 (ref 5–15)
BUN: 38 mg/dL — ABNORMAL HIGH (ref 6–20)
CO2: 29 mmol/L (ref 22–32)
Calcium: 9 mg/dL (ref 8.9–10.3)
Chloride: 97 mmol/L — ABNORMAL LOW (ref 101–111)
Creatinine, Ser: 1.28 mg/dL — ABNORMAL HIGH (ref 0.61–1.24)
GFR calc Af Amer: >60 mL/min (ref >60)
GFR calc non Af Amer: 52 mL/min — ABNORMAL LOW (ref >60)
Glucose, Bld: 168 mg/dL — ABNORMAL HIGH (ref 65–99)
Potassium: 4.2 mmol/L (ref 3.5–5.1)
Sodium: 133 mmol/L — ABNORMAL LOW (ref 135–145)

## 2017-07-01 NOTE — Progress Notes (Signed)
Occupational Therapy Session Note  Patient Details  Name: Frank [name] MRN: [id number] Date of Birth: 1940-10-22  Today's Date: 07/01/2017 OT Individual Time: 2542-7062 OT Individual Time Calculation (min): 60 min    Short Term Goals: Week 1:  OT Short Term Goal 1 (Week 1): Pt will complete grooming tasks from standing position with supervision OT Short Term Goal 2 (Week 1): Pt will bathe sit <> stand with min steadying assist OT Short Term Goal 3 (Week 1): Pt will utilize RW during functional ambulation and sit <> stands safely with VCs only OT Short Term Goal 4 (Week 1): Pt will complete 3/3 toileting tasks with guarding assist  Skilled Therapeutic Interventions/Progress Updates:    Pt seen for OT ADL bathing/dressing session. Pt in supine upon arrrival, awkae and ready for tx session. He transferred to EOB from flat bed with no rails in simulation of home environment, requiring VCs for technique and min A to elevate trunk.  He ambulated with HHA and gathered clothing items from dresser with assist for bending down to obtain items. He bathed seated on tub bench with supervision. Required VCs for safety awareness as pt attempted to stand to doff shirt, impeding vision and balance compensation as well as standing on one foot in the shower in attempt to dry fet. Education provided regarding pt's high fall risk and ways to reduce risk of fall. He required mod A when ambulating out of bathroom over large decline threshhold.  He dressed seated in w/c, standing with guarding assist to pull pants up. Propped foot up in recliner in order to access to don B shoes and socks. He returned to recliner at end of session, left set-up with breakfast tray and all needs in reach.   Therapy Documentation Precautions:  Precautions Precautions: Fall Precaution Comments: left bias; R chronic clavicle fx Required Braces or Orthoses: Sling(Sling for comfort only, pt declines use) Restrictions Weight  Bearing Restrictions: No Pain:   No/ denies pain  See Function Navigator for Current Functional Status.   Therapy/Group: Individual Therapy  Jniyah Dantuono L 07/01/2017, 7:07 AM

## 2017-07-01 NOTE — IPOC Note (Signed)
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[place])     Functional Problem List: Nursing Safety  PT Balance, Behavior, Motor, Perception, Safety, Sensory, Endurance  OT Balance, Perception, Safety, Sensory, Endurance, Motor  SLP    TR         Basic ADL's: OT Grooming, Dressing, Bathing, Toileting     Advanced  ADL's: OT       Transfers: PT Bed Mobility, Bed to Chair, Car, Manufacturing systems engineer, Metallurgist: PT Ambulation, Emergency planning/management officer, Stairs     Additional Impairments: OT None  SLP        TR      Anticipated Outcomes Item Anticipated Outcome  Self Feeding Mod I  Swallowing      Basic self-care  Supervision-mod I  Toileting  Mod I   Bathroom Transfers Supervision-mod I  Bowel/Bladder  remain continent of bowel and bladder, mod I  Transfers  supervision  Locomotion  supervsion   Communication     Cognition     Pain  Remain free of pain on pain controlled below level 2  Safety/Judgment  Remain free of falls with cues and reminders. Remain free of infection and skin breakdown with min assist.    Therapy Plan: PT Intensity: Minimum of 1-2 x/day ,45 to 90 minutes PT Frequency: 5 out of 7 days PT Duration Estimated Length of Stay: 10-12 OT Intensity: Minimum of 1-2 x/day, 45 to 90 minutes OT Frequency: 5 out of 7 days OT Duration/Estimated Length of Stay: 10-12 days      Team Interventions: Nursing Interventions Patient/Family Education, Discharge Planning, Medication Management  PT interventions Ambulation/gait training, Discharge planning, Functional mobility training, Therapeutic Activities, Visual/perceptual remediation/compensation, Training and development officer, Disease management/prevention, Neuromuscular re-education, Therapeutic  Exercise, Wheelchair propulsion/positioning, Cognitive remediation/compensation, Splinting/orthotics, UE/LE Strength taining/ROM, DME/adaptive equipment instruction, Community reintegration, Technical sales engineer stimulation, Patient/family education, IT trainer, UE/LE Coordination activities  OT Interventions Training and development officer, Discharge planning, Functional electrical stimulation, Pain management, Self Care/advanced ADL retraining, Therapeutic Activities, UE/LE Coordination activities, Functional mobility training, Patient/family education, Therapeutic Exercise, Community reintegration, Engineer, drilling, Neuromuscular re-education, Psychosocial support, UE/LE Strength taining/ROM  SLP Interventions    TR Interventions    SW/CM Interventions Discharge Planning, Psychosocial Support, Patient/Family Education   Barriers to Discharge MD  Medical stability  Nursing      PT Behavior    OT      SLP      SW       Team Discharge Planning: Destination: PT-Home ,OT- Home , SLP-  Projected Follow-up: PT-Outpatient PT, OT-  Outpatient OT, SLP-  Projected Equipment Needs: PT-To be determined, OT- To be determined, SLP-  Equipment Details: PT- , OT-  Patient/family involved in discharge planning: PT- Patient,  OT-Patient, SLP-   MD ELOS: 10-12 days Medical Rehab Prognosis:  Excellent Assessment: The patient has been admitted for CIR therapies with the diagnosis of tbi. The team will be addressing functional mobility, strength, stamina, balance, safety, adaptive techniques and equipment, self-care, bowel and bladder mgt, patient and caregiver education, NMR, visual-perceptual awareness, family ed. Goals have been set at supervision for mobility, self-care/ADL's.Meredith Staggers, MD, FAAPMR      See Team Conference Notes for weekly updates to the plan of care

## 2017-07-01 NOTE — Progress Notes (Signed)
Physical Therapy Session Note  Patient Details  Name: Frank [name] MRN: [id number] Date of Birth: 1941-04-02  Today's Date: 07/01/2017 PT Individual Time: 1015-1130AND 1600-1700 PT Individual Time Calculation (min): 75 min   Short Term Goals: Week 1:  PT Short Term Goal 1 (Week 1): Pt will ambulated x 150 ft with LRAD and supervision assist PT Short Term Goal 2 (Week 1): Pt will perform sit<>stands with supervision  PT Short Term Goal 3 (Week 1): Pt will improve balance by demonstrating berg balance test score of at least 35/56   Skilled Therapeutic Interventions/Progress Updates:      Pt received sitting in WC and agreeable to PT  Gait training instructed by PT without AD 23f, 156fx 2, 7580fnd [id number] assist progressing to supervision assist. Pt noted to have decreased weight shift R as well as intermittent decreased step length on the L.   Dynavision on level surface progressing to standing on airex pad. On level surface pt scored 27/29/29 on airex pad, 19/21/19. Pt noted to have slowest reaction time in the Lower L quadrant. Supervision assist on level surface. Min-mod assist on airex pad due to posterior/L LOB. Mild improvement in use of ankle strategy to prevent LOB throughout balance training.   PT instructed pt in DGI, see below for results.   Patient returned to room and [facility] and all needs met.    Session 2:  Pt received sitting in recliner and agreeable to PT.   [facility] and min [facility]. PT transported pt to rehab gym. PT treatment focused on gait training with various assistive  Devices. No AD x 150ft71fh min assist; poor weight shift R as well as inconsistent foot clearance on the L. Gait training with SPC 3 x 150ft 69f min assist and moderate cues for AD management as well as 3 pont gait pattern. Mild improvement in weight shifting with SPC, but pt note to have difficulty sustaining reciprocal  pattern. Gait training also performed with ROllator, 150ft, 54ft, a67f20ft; [id number] assist throughout with rollator. Improved posture and gait pattern with rollator compared to all other AD. Toileting with ambulatory transfer to bathroom with supervision assist. Pt able to complete all peri care with distant supervision assist from PT. Patient returned to room and left sitting in recliner with call bell in reach and all needs met.         Therapy Documentation Precautions:  Precautions Precautions: Fall Precaution Comments: left bias; R chronic clavicle fx Required Braces or Orthoses: Sling(Sling for comfort only, pt declines use) Restrictions Weight Bearing Restrictions: No Pain: 0/10     Balance: Balance Balance Assessed: Yes Standardized Balance Assessment Standardized Balance Assessment: Dynamic Gait Index Dynamic Gait Index Level Surface: Mild Impairment Change in Gait Speed: Severe Impairment Gait with Horizontal Head Turns: Moderate Impairment Gait with Vertical Head Turns: Mild Impairment Gait and Pivot Turn: Mild Impairment Step Over Obstacle: Severe Impairment Step Around Obstacles: Moderate Impairment Steps: Moderate Impairment Total Score: 9  (<19 indicates increased fall risk)    See Function Navigator for Current Functional Status.   Therapy/Group: Individual Therapy  Taja Pentland E Lorie Phenix18, 1:48 PM

## 2017-07-01 NOTE — Progress Notes (Signed)
PHYSICAL MEDICINE & REHABILITATION     PROGRESS NOTE    Subjective/Complaints: Up with OT this morning. No new complaints.   ROS: pt denies nausea, vomiting, diarrhea, cough, shortness of breath or chest pain    Objective: Vital Signs: Blood pressure (!) 158/68, pulse 71, temperature 98.4 F (36.9 C), temperature source Oral, resp. rate 18, height 5\' 10"  (1.778 m), weight 90.2 kg (198 lb 13.7 oz), SpO2 96 %. No results found. Recent Labs    06/29/17 0759  WBC 7.0  HGB 11.4*  HCT 35.5*  PLT 202   Recent Labs    06/29/17 0759 07/01/17 0527  NA 134* 133*  K 4.3 4.2  CL 98* 97*  GLUCOSE 236* 168*  BUN 31* 38*  CREATININE 1.22 1.28*  CALCIUM 9.3 9.0   CBG (last 3)  Recent Labs    06/30/17 1618 06/30/17 2119 07/01/17 0646  GLUCAP 219* 116* 155*    Wt Readings from Last 3 Encounters:  06/28/17 90.2 kg (198 lb 13.7 oz)  06/23/17 94.1 kg (207 lb 7.3 oz)  05/12/17 98.5 kg (217 lb 3.2 oz)    Physical Exam:  Constitutional: He is oriented to person, place, and time. He appears well-developed.  Obese  HENT:  Head: Normocephalic and atraumatic.  Eyes: EOM are normal. Right eye exhibits no discharge. Left eye exhibits no discharge.  Neck: Normal range of motion. Neck supple. No thyromegaly present.  Cardiovascular: RRR without murmur. No JVD   Respiratory: CTA Bilaterally without wheezes or rales. Normal effort  GI: Soft. Bowel sounds are normal. He exhibits no distension.  Musculoskeletal: He exhibits no edema or tenderness.  Neurological: He is alert and oriented to person, place, and time. Normal insight and awarenes Motor:  RUE?RLE: 5/5 proximal to distal  LUE/LLE: 4+/5 prox to distal.  No changes.  Good sitting balance.  Skin: Skin is warm and dry.  Psychiatric: pleasant and appropriate    Assessment/Plan: 1. Functional and mobility deficits secondary to right posterior parietal IPH which require 3+ hours per day of interdisciplinary therapy in  a comprehensive inpatient rehab setting. Physiatrist is providing close team supervision and 24 hour management of active medical problems listed below. Physiatrist and rehab team continue to assess barriers to discharge/monitor patient progress toward functional and medical goals.  Function:  Bathing Bathing position Bathing activity did not occur: Refused Position: Production manager parts bathed by patient: Right arm, Left arm, Chest, Abdomen, Front perineal area, Right upper leg, Buttocks, Left upper leg, Right lower leg, Left lower leg Body parts bathed by helper: Back  Bathing assist Assist Level: Touching or steadying assistance(Pt > 75%)      Upper Body Dressing/Undressing Upper body dressing   What is the patient wearing?: Pull over shirt/dress     Pull over shirt/dress - Perfomed by patient: Thread/unthread right sleeve, Thread/unthread left sleeve, Put head through opening, Pull shirt over trunk          Upper body assist Assist Level: Supervision or verbal cues   Set up : To obtain clothing/put away  Lower Body Dressing/Undressing Lower body dressing   What is the patient wearing?: Underwear, Pants, Socks, Shoes Underwear - Performed by patient: Pull underwear up/down, Thread/unthread left underwear leg, Thread/unthread right underwear leg Underwear - Performed by helper: Thread/unthread right underwear leg Pants- Performed by patient: Thread/unthread right pants leg, Thread/unthread left pants leg, Pull pants up/down Pants- Performed by helper: Thread/unthread right pants leg   Non-skid slipper socks-  Performed by helper: Don/doff right sock, Don/doff left sock Socks - Performed by patient: Don/doff right sock, Don/doff left sock   Shoes - Performed by patient: Don/doff right shoe, Don/doff left shoe, Fasten right, Fasten left Shoes - Performed by helper: Fasten left          Lower body assist Assist for lower body dressing: Touching or steadying  assistance (Pt > 75%)      Toileting Toileting   Toileting steps completed by patient: Adjust clothing prior to toileting, Performs perineal hygiene, Adjust clothing after toileting   Toileting Assistive Devices: Grab bar or rail  Toileting assist Assist level: Touching or steadying assistance (Pt.75%)   Transfers Chair/bed transfer   Chair/bed transfer method: Stand pivot Chair/bed transfer assist level: Touching or steadying assistance (Pt > 75%) Chair/bed transfer assistive device: Medical sales representative     Max distance: 151ft  Assist level: Supervision or verbal cues   Wheelchair          Cognition Comprehension Comprehension assist level: Follows complex conversation/direction with extra time/assistive device  Expression Expression assist level: Expresses complex 90% of the time/cues < 10% of the time  Social Interaction Social Interaction assist level: Interacts appropriately with others with medication or extra time (anti-anxiety, antidepressant).  Problem Solving Problem solving assist level: Solves complex problems: With extra time  Memory Memory assist level: Recognizes or recalls 90% of the time/requires cueing < 10% of the time   Medical Problem List and Plan: 1.  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  DVT Prophylaxis/Anticoagulation: SCDs. Monitor for any signs of DVT. Check vascular study 3. Pain Management: Tylenol as needed 4. Mood: Prozac 20 mg daily. Provide emotional support 5. Neuropsych: This patient is capable of making decisions on his own behalf. 6. Skin/Wound Care: Routine skin checks 7. Fluids/Electrolytes/Nutrition: BUN and creatinine trending up further today   -discussed fluid intake again.     -recheck bmet again tomorrow, if further worsening, will start IVF 8. Hypertension. Norvasc 10 mg daily,  Cardura 2 mg daily at bedtime, Avapro 150 mg twice a day. Monitor with increased mobility   -Reasonable control at present 9. Diabetes mellitus peripheral neuropathy. Hemoglobin A1c 7.2. SSI. Check blood sugars before meals and at bedtime. Patient on Glucotrol XL 10 mg daily, Januvia 100 mg daily, Glucophage 1000 mg twice a day prior to admission.   -Resumed Glucotrol XL on 12/19     CBG (last 3)  Recent Labs    06/30/17 1618 06/30/17 2119 07/01/17 0646  GLUCAP 219* 116* 155*    10. BPH. The triptan 5 mg twice a day.  Emptying bladder 11. Hyperlipidemia. Lipitor 12. History of chronic right rotator cuff tear as well as right distal clavicle fracture. Conservative care. Weightbearing as tolerated. Shoulder sling for comfort as needed   -Does not appear to be a major issue at present   LOS (Days) 3 A Duarte T, MD 07/01/2017 10:13 AM

## 2017-07-02 ENCOUNTER — Inpatient Hospital Stay (HOSPITAL_COMMUNITY): Payer: Self-pay | Admitting: Occupational Therapy

## 2017-07-02 ENCOUNTER — Inpatient Hospital Stay (HOSPITAL_COMMUNITY): Payer: Self-pay | Admitting: Physical Therapy

## 2017-07-02 LAB — BASIC METABOLIC PANEL
Anion gap: 8 (ref 5–15)
BUN: 30 mg/dL — ABNORMAL HIGH (ref 6–20)
CO2: 28 mmol/L (ref 22–32)
Calcium: 9.1 mg/dL (ref 8.9–10.3)
Chloride: 96 mmol/L — ABNORMAL LOW (ref 101–111)
Creatinine, Ser: 1.25 mg/dL — ABNORMAL HIGH (ref 0.61–1.24)
GFR calc Af Amer: >60 mL/min (ref >60)
GFR calc non Af Amer: 53 mL/min — ABNORMAL LOW (ref >60)
Glucose, Bld: 250 mg/dL — ABNORMAL HIGH (ref 65–99)
Potassium: 4.6 mmol/L (ref 3.5–5.1)
Sodium: 132 mmol/L — ABNORMAL LOW (ref 135–145)

## 2017-07-02 LAB — GLUCOSE, CAPILLARY
Glucose-Capillary: 187 mg/dL — ABNORMAL HIGH (ref 65–99)
Glucose-Capillary: 165 mg/dL — ABNORMAL HIGH (ref 65–99)
Glucose-Capillary: 149 mg/dL — ABNORMAL HIGH (ref 65–99)
Glucose-Capillary: 183 mg/dL — ABNORMAL HIGH (ref 65–99)

## 2017-07-02 MED ORDER — IRBESARTAN 150 MG PO TABS
150.0000 mg | ORAL_TABLET | Freq: Two times a day (BID) | ORAL | Status: DC
  Administered 2017-07-02 – 2017-07-09 (×15): 150 mg via ORAL
  Filled 2017-07-02 (×16): qty 1

## 2017-07-02 NOTE — Progress Notes (Signed)
Occupational Therapy Session Note  Patient Details  Name: Frank [name] MRN: [id number] Date of Birth: [birth year]  Today's Date: 07/02/2017 OT Individual Time: 0700-0800 OT Individual Time Calculation (min): 60 min    Short Term Goals: Week 1:  OT Short Term Goal 1 (Week 1): Pt will complete grooming tasks from standing position with supervision OT Short Term Goal 2 (Week 1): Pt will bathe sit <> stand with min steadying assist OT Short Term Goal 3 (Week 1): Pt will utilize RW during functional ambulation and sit <> stands safely with VCs only OT Short Term Goal 4 (Week 1): Pt will complete 3/3 toileting tasks with guarding assist  Skilled Therapeutic Interventions/Progress Updates:    Pt seen for OT ADL bathing/dressing session. Pt in supine upon arrival, awake and ready for tx Session. He transferred to EOB using hospital bed functions with increased time. Voiced dizziness upon sitting EOB, BP 169/69 though pt reports dizziness quickly subsided. He ambulated throughout room with rollator and guardin assist, min cues for management of breaks.  He bathed with distant supervision seated on tub bench. Returned to toilet to dress requiring increased time for clothing management, displays some proprioceptive dficits in L UE impacting his dressing abilities.   He completed grooming tasks standing at sink, maintaining standing balance at midline with supervision. Pt returned to recliner at end of session, left sitting in recliner set-up with breakfast and all needs in reach  Therapy Documentation Precautions:  Precautions Precautions: Fall Precaution Comments: left bias; R chronic clavicle fx Required Braces or Orthoses: Sling(Sling for comfort only, pt declines use) Restrictions Weight Bearing Restrictions: No Pain:   Complaints of headache upon arrival, RN aware, shower, and repositioned. BP assessed  See Function Navigator for Current Functional Status.   Therapy/Group:  Individual Therapy  Kenzee Bassin L 07/02/2017, 6:24 AM

## 2017-07-02 NOTE — Progress Notes (Signed)
Occupational Therapy Session Note  Patient Details  Name: Frank [name] MRN: [id number] Date of Birth: Jul 18, 1940  Today's Date: 07/02/2017 OT Individual Time: 8325-4982 OT Individual Time Calculation (min): 55 min    Short Term Goals: Week 1:  OT Short Term Goal 1 (Week 1): Pt will complete grooming tasks from standing position with supervision OT Short Term Goal 2 (Week 1): Pt will bathe sit <> stand with min steadying assist OT Short Term Goal 3 (Week 1): Pt will utilize RW during functional ambulation and sit <> stands safely with VCs only OT Short Term Goal 4 (Week 1): Pt will complete 3/3 toileting tasks with guarding assist  Skilled Therapeutic Interventions/Progress Updates:    Pt seated in bedside recliner upon OT arrival and was agreeable to participating in skilled OT session with focus on dynamic standing balance and safety with functional mobility during BADLs/IADLs.  Pt stood with SBA and ambulated with rollator to gym with CGA and multimodal cues to attend to obstacles on L side of hallway as pt noticed obstacles ~25% of the time, frequently hitting rollator against them and/or tripping over obstacles with LLE.  Mod cues for rollator safety and awareness.  Pt participated in home environment simulation, reaching down to low cabinets with CGA/SBA and unilateral UE support on countertop.  Pt ambulated to gym in similar fashion to participate in visual activity designed to improve attention to detail necessary for safety/I with fxnl mobility and self-care tasks.  Pt then completed gentle BUE therex with no reports of pain, completing UE therex with 1# dowel rod, 2 sets of 10 x 4 exercises.  Pt assisted back to room and was left seated in bedside recliner with all needs in reach upon OT departure.  Therapy Documentation Precautions:  Precautions Precautions: Fall Precaution Comments: left bias; R chronic clavicle fx Required Braces or Orthoses: Sling(Sling for comfort only,  pt declines use) Restrictions Weight Bearing Restrictions: No Pain: Pain Assessment Pain Assessment: No/denies pain  See Function Navigator for Current Functional Status.   Therapy/Group: Individual Therapy  Marcella Dubs 07/02/2017, 12:18 PM

## 2017-07-02 NOTE — Progress Notes (Signed)
[name] PHYSICAL MEDICINE & REHABILITATION     PROGRESS NOTE    Subjective/Complaints: No new issues. Had a good night. No pain. Pleased with functional progress  ROS: pt denies nausea, vomiting, diarrhea, cough, shortness of breath or chest pain   Objective: Vital Signs: Blood pressure (!) 163/73, pulse 69, temperature 97.8 F (36.6 C), temperature source Oral, resp. rate 16, height 5\' 10"  (1.778 m), weight 90.2 kg (198 lb 13.7 oz), SpO2 96 %. No results found. No results for input(s): WBC, HGB, HCT, PLT in the last 72 hours. Recent Labs    07/01/17 0527 07/02/17 0920  NA 133* 132*  K 4.2 4.6  CL 97* 96*  GLUCOSE 168* 250*  BUN 38* 30*  CREATININE 1.28* 1.25*  CALCIUM 9.0 9.1   CBG (last 3)  Recent Labs    07/01/17 1718 07/01/17 2119 07/02/17 0622  GLUCAP 110* 175* 187*    Wt Readings from Last 3 Encounters:  06/28/17 90.2 kg (198 lb 13.7 oz)  06/23/17 94.1 kg (207 lb 7.3 oz)  05/12/17 98.5 kg (217 lb 3.2 oz)    Physical Exam:  Constitutional: He is oriented to person, place, and time. He appears well-developed.  Obese  HENT:  Head: Normocephalic and atraumatic.  Eyes: EOM are normal. Right eye exhibits no discharge. Left eye exhibits no discharge.  Neck: Normal range of motion. Neck supple. No thyromegaly present.  Cardiovascular: RRR without murmur. No JVD    Respiratory: CTA Bilaterally without wheezes or rales. Normal effort  GI: Soft. Bowel sounds are normal. He exhibits no distension.  Musculoskeletal: He exhibits no edema or tenderness.  Neurological: He is alert and oriented to person, place, and time. Normal insight and awarenes Motor:  RUE?RLE: 5/5 proximal to distal  LUE/LLE: 4+/5 prox to distal.  No changes.  Improving standing balance  Skin: Skin is warm and dry.  Psychiatric: pleasant and appropriate    Assessment/Plan: 1. Functional and mobility deficits secondary to right posterior parietal IPH which require 3+ hours per day of  interdisciplinary therapy in a comprehensive inpatient rehab setting. Physiatrist is providing close team supervision and 24 hour management of active medical problems listed below. Physiatrist and rehab team continue to assess barriers to discharge/monitor patient progress toward functional and medical goals.  Function:  Bathing Bathing position Bathing activity did not occur: Refused Position: Production manager parts bathed by patient: Right arm, Left arm, Chest, Abdomen, Front perineal area, Right upper leg, Buttocks, Left upper leg, Right lower leg, Left lower leg, Back Body parts bathed by helper: Back  Bathing assist Assist Level: Supervision or verbal cues      Upper Body Dressing/Undressing Upper body dressing   What is the patient wearing?: Pull over shirt/dress     Pull over shirt/dress - Perfomed by patient: Thread/unthread right sleeve, Thread/unthread left sleeve, Put head through opening, Pull shirt over trunk          Upper body assist Assist Level: Touching or steadying assistance(Pt > 75%)   Set up : To obtain clothing/put away  Lower Body Dressing/Undressing Lower body dressing   What is the patient wearing?: Underwear, Pants, Socks, Shoes Underwear - Performed by patient: Pull underwear up/down, Thread/unthread left underwear leg, Thread/unthread right underwear leg Underwear - Performed by helper: Thread/unthread right underwear leg Pants- Performed by patient: Thread/unthread right pants leg, Thread/unthread left pants leg, Pull pants up/down Pants- Performed by helper: Thread/unthread right pants leg   Non-skid slipper socks- Performed by helper:  Don/doff right sock, Don/doff left sock Socks - Performed by patient: Don/doff right sock, Don/doff left sock   Shoes - Performed by patient: Don/doff right shoe, Don/doff left shoe, Fasten right, Fasten left Shoes - Performed by helper: Fasten left          Lower body assist Assist for lower body  dressing: Supervision or verbal cues      Toileting Toileting   Toileting steps completed by patient: Adjust clothing prior to toileting, Performs perineal hygiene, Adjust clothing after toileting Toileting steps completed by helper: Adjust clothing prior to toileting, Adjust clothing after toileting Toileting Assistive Devices: Grab bar or rail  Toileting assist Assist level: Touching or steadying assistance (Pt.75%)   Transfers Chair/bed transfer   Chair/bed transfer method: Stand pivot Chair/bed transfer assist level: Touching or steadying assistance (Pt > 75%) Chair/bed transfer assistive device: Medical sales representative     Max distance: 220 Assist level: Supervision or verbal cues   Wheelchair          Cognition Comprehension Comprehension assist level: Follows complex conversation/direction with extra time/assistive device  Expression Expression assist level: Expresses complex 90% of the time/cues < 10% of the time  Social Interaction Social Interaction assist level: Interacts appropriately with others with medication or extra time (anti-anxiety, antidepressant).  Problem Solving Problem solving assist level: Solves complex problems: With extra time  Memory Memory assist level: Recognizes or recalls 90% of the time/requires cueing < 10% of the time   Medical Problem List and Plan: 1.  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  DVT Prophylaxis/Anticoagulation: SCDs. Monitor for any signs of DVT. Check vascular study 3. Pain Management: Tylenol as needed 4. Mood: Prozac 20 mg daily. Provide emotional support 5. Neuropsych: This patient is capable of making decisions on his own behalf. 6. Skin/Wound Care: Routine skin checks 7. Fluids/Electrolytes/Nutrition: BUN and creatinine improved today  (30/1.25)   -continue to push po.      -recheck labs monday 8. Hypertension. Norvasc 10 mg daily, Cardura 2 mg daily at bedtime, Avapro 150 mg twice a day. Monitor with increased mobility   -fair to reasonable control at present 9. Diabetes mellitus peripheral neuropathy. Hemoglobin A1c 7.2. SSI. Check blood sugars before meals and at bedtime. Patient on Glucotrol XL 10 mg daily, Januvia 100 mg daily, Glucophage 1000 mg twice a day prior to admission.   -Resumed Glucotrol XL on 12/19 with improvement already. Continue to observe today     CBG (last 3)  Recent Labs    07/01/17 1718 07/01/17 2119 07/02/17 0622  GLUCAP 110* 175* 187*    10. BPH. The triptan 5 mg twice a day.  Emptying bladder 11. Hyperlipidemia. Lipitor 12. History of chronic right rotator cuff tear as well as right distal clavicle fracture. Conservative care. Weightbearing as tolerated. Shoulder sling for comfort as needed   -Does not appear to be a major issue at present   LOS (Days) 4 A Beauregard T, MD 07/02/2017 10:29 AM

## 2017-07-02 NOTE — Progress Notes (Signed)
Physical Therapy Session Note  Patient Details  Name: Feliz Lincoln MRN: [id number] Date of Birth: 18-Mar-1941  Today's Date: 07/02/2017 PT Individual Time: 4239-5320 PT Individual Time Calculation (min): 75 min   Short Term Goals: Week 1:  PT Short Term Goal 1 (Week 1): Pt will ambulated x 150 ft with LRAD and supervision assist PT Short Term Goal 2 (Week 1): Pt will perform sit<>stands with supervision  PT Short Term Goal 3 (Week 1): Pt will improve balance by demonstrating berg balance test score of at least 35/56   Skilled Therapeutic Interventions/Progress Updates:   Pt received sitting in recliner and agreeable to PT  Gait training to rehab gym with Rollator and supervision assist. Stand pivot transfer to [place]. Pt transported to hospital gift shop. Gait with rollator x 21f through hospital gift shop and in food court on first floor. As patient turned to sit in arm chair of hospital food court area, he miss stepped with the LLE resulting in sudden LOB to the L. PT attempted to redirect pt's fall into Arm chair, but was unable and PT was required to assist patient to floor to minimize impact. Pt did not sustain head injury or lose consciousness. Pt assisted into standing position with +2 assist. Pt reports making contact with the ground on his L hip, no pain following assist to floor by PT. PT transported back to rehab gym. Vitals assessed: HR 68, BP125/53, and SpO2 100%.   Pt agreeable to continue PT treatment. Standing balance to complete cloth pin tree with lateral and cross body reaches with BUE. Gait with Rollator x [id number] min-supervision assist from PT. Min cues for gait pattern in turns. [facility] and all needs met.          Therapy Documentation Precautions:  Precautions Precautions: Fall Precaution Comments: left bias; R chronic clavicle fx Required Braces or Orthoses: Sling(Sling for comfort only, pt  declines use) Restrictions Weight Bearing Restrictions: No Vital Signs: Therapy Vitals Pulse Rate: 68 BP: (!) 125/56 Patient Position (if appropriate): Sitting Oxygen Therapy SpO2: 100 % O2 Device: Not Delivered Pain: 0/10   See Function Navigator for Current Functional Status.   Therapy/Group: Individual Therapy  AuLorie Phenix2/21/2018, 5:31 PM

## 2017-07-03 ENCOUNTER — Inpatient Hospital Stay (HOSPITAL_COMMUNITY): Payer: Self-pay | Admitting: Occupational Therapy

## 2017-07-03 ENCOUNTER — Inpatient Hospital Stay (HOSPITAL_COMMUNITY): Payer: Self-pay | Admitting: Physical Therapy

## 2017-07-03 DIAGNOSIS — E871 Hypo-osmolality and hyponatremia: Secondary | ICD-10-CM

## 2017-07-03 DIAGNOSIS — I10 Essential (primary) hypertension: Secondary | ICD-10-CM

## 2017-07-03 DIAGNOSIS — E1142 Type 2 diabetes mellitus with diabetic polyneuropathy: Secondary | ICD-10-CM

## 2017-07-03 DIAGNOSIS — D62 Acute posthemorrhagic anemia: Secondary | ICD-10-CM

## 2017-07-03 DIAGNOSIS — N182 Chronic kidney disease, stage 2 (mild): Secondary | ICD-10-CM

## 2017-07-03 LAB — GLUCOSE, CAPILLARY
Glucose-Capillary: 180 mg/dL — ABNORMAL HIGH (ref 65–99)
Glucose-Capillary: 166 mg/dL — ABNORMAL HIGH (ref 65–99)
Glucose-Capillary: 116 mg/dL — ABNORMAL HIGH (ref 65–99)
Glucose-Capillary: 221 mg/dL — ABNORMAL HIGH (ref 65–99)

## 2017-07-03 MED ORDER — HYDRALAZINE HCL 10 MG PO TABS
10.0000 mg | ORAL_TABLET | Freq: Every day | ORAL | Status: DC
  Administered 2017-07-03 – 2017-07-04 (×2): 10 mg via ORAL
  Filled 2017-07-03 (×2): qty 1

## 2017-07-03 MED ORDER — LINAGLIPTIN 5 MG PO TABS
2.5000 mg | ORAL_TABLET | Freq: Every day | ORAL | Status: DC
  Administered 2017-07-03 – 2017-07-06 (×4): 2.5 mg via ORAL
  Filled 2017-07-03 (×5): qty 1

## 2017-07-03 NOTE — Progress Notes (Signed)
Occupational Therapy Session Note  Patient Details  Name: Frank [name] MRN: [id number] Date of Birth: 03-05-1941  Today's Date: 07/03/2017 OT Individual Time: 1345-1430 OT Individual Time Calculation (min): 45 min    Short Term Goals: Week 1:  OT Short Term Goal 1 (Week 1): Pt will complete grooming tasks from standing position with supervision OT Short Term Goal 2 (Week 1): Pt will bathe sit <> stand with min steadying assist OT Short Term Goal 3 (Week 1): Pt will utilize RW during functional ambulation and sit <> stands safely with VCs only OT Short Term Goal 4 (Week 1): Pt will complete 3/3 toileting tasks with guarding assist  Skilled Therapeutic Interventions/Progress Updates:    Treatment session focused on ADL/self care training, transfer training, balance retraining, NMR, and pt education. Upon entering room, pt upright in w/c and agreeable to OT session. Pt completed shaving with electric razor at sink at standing level with min guard A. Pt demo'ed functional mobility with RW in room and in hallway with min guard A. Therapist educated pt on safety with use of RW and practiced brakes and hallway maneuvering. Pt completed sit<> stand transfers with RW and in chairs with CGA and v/c for hand/foot placement. Pt noted to have increased safety awareness and balance during session with good standing tolerance during standing there activity. Pt completed standing for 5 minutes during task with minimal support from walker for dynamic standing balance task on even surface. Pt returned to room via RW and repositioned in chair with all needs met.    Therapy Documentation Precautions:  Precautions Precautions: Fall Precaution Comments: left bias; R chronic clavicle fx Required Braces or Orthoses: Sling(Sling for comfort only, pt declines use) Restrictions Weight Bearing Restrictions: No General:   Vital Signs: Therapy Vitals Temp: (!) 97.5 F (36.4 C) Temp Source: Oral Pulse  Rate: 75 Resp: 18 BP: (!) 89/47(nurse notified) Patient Position (if appropriate): Sitting Oxygen Therapy SpO2: 100 % O2 Device: Not Delivered Pain: Pain Assessment Pain Assessment: No/denies pain  See Function Navigator for Current Functional Status.   Therapy/Group: Individual Therapy  Delon [place] 07/03/2017, 4:22 PM

## 2017-07-03 NOTE — Progress Notes (Signed)
[facility] PHYSICAL MEDICINE & REHABILITATION     PROGRESS NOTE    Subjective/Complaints: Patient seen lying in bed this morning. He states he slept well overnight. He denies complaints.  ROS: Denies nausea, vomiting, diarrhea, shortness of breath or chest pain   Objective: Vital Signs: Blood pressure (!) 166/63, pulse 71, temperature 97.6 F (36.4 C), temperature source Oral, resp. rate 20, height 5\' 10"  (1.778 m), weight 90.2 kg (198 lb 13.7 oz), SpO2 97 %. No results found. No results for input(s): WBC, HGB, HCT, PLT in the last 72 hours. Recent Labs    07/01/17 0527 07/02/17 0920  NA 133* 132*  K 4.2 4.6  CL 97* 96*  GLUCOSE 168* 250*  BUN 38* 30*  CREATININE 1.28* 1.25*  CALCIUM 9.0 9.1   CBG (last 3)  Recent Labs    07/02/17 2106 07/03/17 0637 07/03/17 1203  GLUCAP 183* 180* 166*    Wt Readings from Last 3 Encounters:  06/28/17 90.2 kg (198 lb 13.7 oz)  06/23/17 94.1 kg (207 lb 7.3 oz)  05/12/17 98.5 kg (217 lb 3.2 oz)    Physical Exam:  Constitutional: He appears well-developed. Obese  HENT: Normocephalic and atraumatic.  Eyes: EOM are normal. No discharge.  Cardiovascular: RRR. No JVD    Respiratory: CTA Bilaterally. Normal effort  GI: Bowel sounds are normal. He exhibits no distension.  Musculoskeletal: He exhibits no edema or tenderness.  Neurological: He is alert and oriented Normal insight and awarenes Motor:  RUE/RLE: 5/5 proximal to distal  LUE/LLE: 4+-5/5 prox to distal.   Skin: Skin is warm and dry.  Psychiatric: pleasant and appropriate  Assessment/Plan: 1. Functional and mobility deficits secondary to right posterior parietal IPH which require 3+ hours per day of interdisciplinary therapy in a comprehensive inpatient rehab setting. Physiatrist is providing close team supervision and 24 hour management of active medical problems listed below. Physiatrist and rehab team continue to assess barriers to discharge/monitor patient progress  toward functional and medical goals.  Function:  Bathing Bathing position Bathing activity did not occur: Refused Position: Production manager parts bathed by patient: Right arm, Left arm, Chest, Abdomen, Front perineal area, Right upper leg, Buttocks, Left upper leg, Right lower leg, Left lower leg, Back Body parts bathed by helper: Back  Bathing assist Assist Level: Supervision or verbal cues      Upper Body Dressing/Undressing Upper body dressing   What is the patient wearing?: Pull over shirt/dress     Pull over shirt/dress - Perfomed by patient: Thread/unthread right sleeve, Thread/unthread left sleeve, Put head through opening, Pull shirt over trunk          Upper body assist Assist Level: Touching or steadying assistance(Pt > 75%)   Set up : To obtain clothing/put away  Lower Body Dressing/Undressing Lower body dressing   What is the patient wearing?: Underwear, Pants, Socks, Shoes Underwear - Performed by patient: Pull underwear up/down, Thread/unthread left underwear leg, Thread/unthread right underwear leg Underwear - Performed by helper: Thread/unthread right underwear leg Pants- Performed by patient: Thread/unthread right pants leg, Thread/unthread left pants leg, Pull pants up/down Pants- Performed by helper: Thread/unthread right pants leg   Non-skid slipper socks- Performed by helper: Don/doff right sock, Don/doff left sock Socks - Performed by patient: Don/doff right sock, Don/doff left sock   Shoes - Performed by patient: Don/doff right shoe, Don/doff left shoe, Fasten right, Fasten left Shoes - Performed by helper: Fasten left  Lower body assist Assist for lower body dressing: Supervision or verbal cues      Toileting Toileting   Toileting steps completed by patient: Adjust clothing prior to toileting, Performs perineal hygiene, Adjust clothing after toileting Toileting steps completed by helper: Adjust clothing prior to toileting,  Adjust clothing after toileting Toileting Assistive Devices: Grab bar or rail  Toileting assist Assist level: Supervision or verbal cues   Transfers Chair/bed transfer   Chair/bed transfer method: Ambulatory Chair/bed transfer assist level: Supervision or verbal cues Chair/bed transfer assistive device: Armrests, Medical sales representative     Max distance: 244ft  Assist level: Touching or steadying assistance (Pt > 75%)   Wheelchair          Cognition Comprehension Comprehension assist level: Follows complex conversation/direction with extra time/assistive device  Expression Expression assist level: Expresses complex 90% of the time/cues < 10% of the time  Social Interaction Social Interaction assist level: Interacts appropriately with others with medication or extra time (anti-anxiety, antidepressant).  Problem Solving Problem solving assist level: Solves complex problems: With extra time  Memory Memory assist level: Recognizes or recalls 90% of the time/requires cueing < 10% of the time   Medical Problem List and Plan: 1.  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  DVT Prophylaxis/Anticoagulation: SCDs. Monitor for any signs of DVT.    Vascular study neg for DVT 3. Pain Management: Tylenol as needed 4. Mood: Prozac 20 mg daily. Provide emotional support 5. Neuropsych: This patient is capable of making decisions on his own behalf. 6. Skin/Wound Care: Routine skin checks 7. Fluids/Electrolytes/Nutrition:    Continue to push po.  8. Hypertension. Norvasc 10 mg daily, Cardura 2 mg daily at bedtime, Avapro 150 mg twice a day. Monitor with increased mobility   AM elevations, hydralazine 10 started qhs 9. Diabetes mellitus peripheral neuropathy. Hemoglobin A1c 7.2. SSI. Check blood sugars before meals and at bedtime. Patient on Glucotrol XL 10 mg daily,  Januvia 100 mg daily, Glucophage 1000 mg twice a day prior to admission.   Resumed Glucotrol XL on 12/19     Tradjenta 2.5 started on 12/22 CBG (last 3)  Recent Labs    07/02/17 2106 07/03/17 0637 07/03/17 1203  GLUCAP 183* 180* 166*    10. BPH. The triptan 5 mg twice a day.  Emptying bladder 11. Hyperlipidemia. Lipitor 12. History of chronic right rotator cuff tear as well as right distal clavicle fracture. Conservative care. Weightbearing as tolerated. Shoulder sling for comfort as needed   -Does not appear to be a major issue at present 13. Hyponatremia   Na 132 on 12/21   Labs ordered for Monday   Cont to monitor 14. ABLA   Hb 11.4 on 12/18   Labs ordered for Monday 15. CKD   Cr 1.25 on 12/21   Labs ordered for Monday   LOS (Days) 5 A FACE TO FACE EVALUATION WAS PERFORMED  Blessin Kanno Lorie Phenix, MD 07/03/2017 12:51 PM

## 2017-07-03 NOTE — Progress Notes (Signed)
Physical Therapy Session Note  Patient Details  Name: Frank [name] MRN: [id number] Date of Birth: [birth year]  Today's Date: 07/03/2017 PT Individual Time: 0800-0915 PT Individual Time Calculation (min): 75 min   Short Term Goals: Week 1:  PT Short Term Goal 1 (Week 1): Pt will ambulated x 150 ft with LRAD and supervision assist PT Short Term Goal 2 (Week 1): Pt will perform sit<>stands with supervision  PT Short Term Goal 3 (Week 1): Pt will improve balance by demonstrating berg balance test score of at least 35/56   Skilled Therapeutic Interventions/Progress Updates:   Pt received sitting EOB and agreeable to PT. Pt requesting to use restroom gait in room to toilet for urination. PT assisted pt with dressing at EOB supervision assist to pull pants to waist, min assist to thread the LLE through pant leg prior to stand. Following dressing, Ambulatory  Transfer to [facility] with supervision assist.   Gait training with Rollator. Variable gait training at rail in hall, forward/retro gait with 1 UE support on rail in hall x4. Side stepping L and R with BUE support on rail x 4 bilaterally. figure 8 2x2 with GCA. Threshold training with Rollator. Min assist and moderate cues for AD management, safety, and problem solving. PT instructed pt in gait training back to room with min assist to manage clothing. Pt noted to have intermittent foot drag on the L, but able to correct without increased cues from PT.  Patient returned to room and left sitting in [facility] with call bell in reach and all needs met.            Therapy Documentation Precautions:  Precautions Precautions: Fall Precaution Comments: left bias; R chronic clavicle fx Required Braces or Orthoses: Sling(Sling for comfort only, pt declines use) Restrictions Weight Bearing Restrictions: No  Vital Signs: Therapy Vitals Temp: 97.6 F (36.4 C) Temp Source: Oral Pulse Rate: 71 Resp: 20 BP: (!) 166/63 Patient Position (if  appropriate): Lying Oxygen Therapy SpO2: 97 % O2 Device: Not Delivered Pain:   0/10   See Function Navigator for Current Functional Status.   Therapy/Group: Individual Therapy  Lorie Phenix 07/03/2017, 9:15 AM

## 2017-07-03 NOTE — Progress Notes (Signed)
Occupational Therapy Session Note  Patient Details  Name: Frank [name] MRN: [id number] Date of Birth: 1940-10-15  Today's Date: 07/03/2017 OT Individual Time: 1045-1200 OT Individual Time Calculation (min): 75 min    Short Term Goals: Week 1:  OT Short Term Goal 1 (Week 1): Pt will complete grooming tasks from standing position with supervision OT Short Term Goal 2 (Week 1): Pt will bathe sit <> stand with min steadying assist OT Short Term Goal 3 (Week 1): Pt will utilize RW during functional ambulation and sit <> stands safely with VCs only OT Short Term Goal 4 (Week 1): Pt will complete 3/3 toileting tasks with guarding assist  Skilled Therapeutic Interventions/Progress Updates:    Pt seen for OT session focusing on ADL re-training, functional mobility, and education. Pt received sitting on toilet upon arrival finished task and completed toileting tasks from standing posistiion with VCs for safe management of rollator during sit>stand and dynamic tasks.  He ambulated throughout unit with rollator and supervision/ VCs. Pt noted to have L foot drag when fatigued and when ambulating at end of session, walking to L of midline of RW, requiring VCs to return to midline.  In ADL apartment, completed kitchen mobility and accessibility with rollator. Required min cues for rollator management in functional context. During seated rest breaks, extensive discussion and education provided regarding pt's high fall risk, ways to reduce risk of fall, DME, continuum of care, and d/c planning. Pt with decreased insight into deficits and poor safety awareness which increases his risk of falls. He did not recall the 2 falls that have taken place during this admission on IPR. Pt returned to room at end of session, left seated in w/c with friend present.     Therapy Documentation Precautions:  Precautions Precautions: Fall Precaution Comments: left bias; R chronic clavicle fx Required Braces or  Orthoses: Sling(Sling for comfort only, pt declines use) Restrictions Weight Bearing Restrictions: No Pain:   No/ denies pain  See Function Navigator for Current Functional Status.   Therapy/Group: Individual Therapy  Vergene Marland L 07/03/2017, 6:43 AM

## 2017-07-04 DIAGNOSIS — N183 Chronic kidney disease, stage 3 unspecified: Secondary | ICD-10-CM

## 2017-07-04 LAB — GLUCOSE, CAPILLARY
Glucose-Capillary: 173 mg/dL — ABNORMAL HIGH (ref 65–99)
Glucose-Capillary: 212 mg/dL — ABNORMAL HIGH (ref 65–99)
Glucose-Capillary: 227 mg/dL — ABNORMAL HIGH (ref 65–99)
Glucose-Capillary: 116 mg/dL — ABNORMAL HIGH (ref 65–99)

## 2017-07-04 NOTE — Plan of Care (Signed)
  Not Progressing RH BLADDER ELIMINATION RH STG MANAGE BLADDER WITH ASSISTANCE Description STG Manage Bladder With mod I  Assistance  Missed urinal requiring total assist with clean up. 07/04/2017 0337 - Not Progressing by Cornell Barman, RN

## 2017-07-04 NOTE — Progress Notes (Signed)
[facility] PHYSICAL MEDICINE & REHABILITATION     PROGRESS NOTE    Subjective/Complaints: Pt seen laying in bed this AM.He slept well overnight.  He feels he is making progress.  He states he does not need a day of rest, but will take it.    ROS: Denies nausea, vomiting, diarrhea, shortness of breath or chest pain   Objective: Vital Signs: Blood pressure (!) 112/45, pulse 73, temperature 97.8 F (36.6 C), temperature source Oral, resp. rate 18, height 5\' 10"  (1.778 m), weight 90.2 kg (198 lb 13.7 oz), SpO2 96 %. No results found. No results for input(s): WBC, HGB, HCT, PLT in the last 72 hours. Recent Labs    07/02/17 0920  NA 132*  K 4.6  CL 96*  GLUCOSE 250*  BUN 30*  CREATININE 1.25*  CALCIUM 9.1   CBG (last 3)  Recent Labs    07/04/17 0627 07/04/17 1139 07/04/17 1636  GLUCAP 173* 212* 227*    Wt Readings from Last 3 Encounters:  06/28/17 90.2 kg (198 lb 13.7 oz)  06/23/17 94.1 kg (207 lb 7.3 oz)  05/12/17 98.5 kg (217 lb 3.2 oz)    Physical Exam:  Constitutional: He appears well-developed. Obese  HENT: Normocephalic and atraumatic.  Eyes: EOM are normal. No discharge.  Cardiovascular: RRR. No JVD    Respiratory: CTA Bilaterally. Normal effort  GI: Bowel sounds are normal. He exhibits no distension.  Musculoskeletal: He exhibits no edema or tenderness.  Neurological: He is alert and oriented Normal insight and awarenes Motor:  RUE/RLE: 5/5 proximal to distal  LUE/LLE: 4+5/5 prox to distal.   Skin: Skin is warm and dry.  Psychiatric: pleasant and appropriate  Assessment/Plan: 1. Functional and mobility deficits secondary to right posterior parietal IPH which require 3+ hours per day of interdisciplinary therapy in a comprehensive inpatient rehab setting. Physiatrist is providing close team supervision and 24 hour management of active medical problems listed below. Physiatrist and rehab team continue to assess barriers to discharge/monitor patient  progress toward functional and medical goals.  Function:  Bathing Bathing position Bathing activity did not occur: Refused Position: Production manager parts bathed by patient: Right arm, Left arm, Chest, Abdomen, Front perineal area, Right upper leg, Buttocks, Left upper leg, Right lower leg, Left lower leg, Back Body parts bathed by helper: Back  Bathing assist Assist Level: Supervision or verbal cues      Upper Body Dressing/Undressing Upper body dressing   What is the patient wearing?: Pull over shirt/dress     Pull over shirt/dress - Perfomed by patient: Thread/unthread right sleeve, Thread/unthread left sleeve, Put head through opening, Pull shirt over trunk          Upper body assist Assist Level: Touching or steadying assistance(Pt > 75%)   Set up : To obtain clothing/put away  Lower Body Dressing/Undressing Lower body dressing   What is the patient wearing?: Underwear, Pants, Socks, Shoes Underwear - Performed by patient: Pull underwear up/down, Thread/unthread left underwear leg, Thread/unthread right underwear leg Underwear - Performed by helper: Thread/unthread right underwear leg Pants- Performed by patient: Thread/unthread right pants leg, Thread/unthread left pants leg, Pull pants up/down Pants- Performed by helper: Thread/unthread right pants leg   Non-skid slipper socks- Performed by helper: Don/doff right sock, Don/doff left sock Socks - Performed by patient: Don/doff right sock, Don/doff left sock   Shoes - Performed by patient: Don/doff right shoe, Don/doff left shoe, Fasten right, Fasten left Shoes - Performed by helper: Fasten  left          Lower body assist Assist for lower body dressing: Supervision or verbal cues      Toileting Toileting   Toileting steps completed by patient: Adjust clothing prior to toileting, Performs perineal hygiene, Adjust clothing after toileting Toileting steps completed by helper: Adjust clothing prior to  toileting, Adjust clothing after toileting Toileting Assistive Devices: Grab bar or rail  Toileting assist Assist level: Supervision or verbal cues   Transfers Chair/bed transfer   Chair/bed transfer method: Ambulatory Chair/bed transfer assist level: Supervision or verbal cues Chair/bed transfer assistive device: Armrests, Medical sales representative     Max distance: 251ft  Assist level: Touching or steadying assistance (Pt > 75%)   Wheelchair          Cognition Comprehension Comprehension assist level: Follows complex conversation/direction with extra time/assistive device  Expression Expression assist level: Expresses complex 90% of the time/cues < 10% of the time  Social Interaction Social Interaction assist level: Interacts appropriately with others with medication or extra time (anti-anxiety, antidepressant).  Problem Solving Problem solving assist level: Solves complex problems: With extra time  Memory Memory assist level: Recognizes or recalls 90% of the time/requires cueing < 10% of the time   Medical Problem List and Plan: 1.  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  DVT Prophylaxis/Anticoagulation: SCDs. Monitor for any signs of DVT.    Vascular study neg for DVT 3. Pain Management: Tylenol as needed 4. Mood: Prozac 20 mg daily. Provide emotional support 5. Neuropsych: This patient is capable of making decisions on his own behalf. 6. Skin/Wound Care: Routine skin checks 7. Fluids/Electrolytes/Nutrition:    Continue to push po.  8. Hypertension. Norvasc 10 mg daily, Cardura 2 mg daily at bedtime, Avapro 150 mg twice a day. Monitor with increased mobility   AM elevations, hydralazine 10 started qhs   Labile, but overall improved control 9. Diabetes mellitus peripheral neuropathy. Hemoglobin A1c 7.2. SSI. Check blood sugars before meals  and at bedtime. Patient on Glucotrol XL 10 mg daily, Januvia 100 mg daily, Glucophage 1000 mg twice a day prior to admission.   Resumed Glucotrol XL on 12/19     Tradjenta 2.5 started on 12/22 CBG (last 3)  Recent Labs    07/04/17 0627 07/04/17 1139 07/04/17 1636  GLUCAP 173* 212* 227*    Remains elevated, will consider further adjustments 10. BPH. The triptan 5 mg twice a day.  Emptying bladder 11. Hyperlipidemia. Lipitor 12. History of chronic right rotator cuff tear as well as right distal clavicle fracture. Conservative care. Weightbearing as tolerated. Shoulder sling for comfort as needed   -Does not appear to be a major issue at present 13. Hyponatremia   Na 132 on 12/21   Labs ordered for tomorrow   Cont to monitor 14. ABLA   Hb 11.4 on 12/18   Labs ordered for tomorrow 15. CKD   Cr 1.25 on 12/21   Labs ordered for tomorrow  LOS (Days) 6 A FACE TO FACE EVALUATION WAS PERFORMED  Nadean Montanaro Lorie Phenix, MD 07/04/2017 5:25 PM

## 2017-07-05 ENCOUNTER — Inpatient Hospital Stay (HOSPITAL_COMMUNITY): Payer: Self-pay | Admitting: Occupational Therapy

## 2017-07-05 ENCOUNTER — Inpatient Hospital Stay (HOSPITAL_COMMUNITY): Payer: Self-pay | Admitting: Physical Therapy

## 2017-07-05 ENCOUNTER — Ambulatory Visit (HOSPITAL_COMMUNITY): Payer: Self-pay

## 2017-07-05 DIAGNOSIS — I69398 Other sequelae of cerebral infarction: Secondary | ICD-10-CM

## 2017-07-05 DIAGNOSIS — R269 Unspecified abnormalities of gait and mobility: Secondary | ICD-10-CM

## 2017-07-05 LAB — CBC WITH DIFFERENTIAL/PLATELET
Basophils Absolute: 0 10*3/uL (ref 0.0–0.1)
Basophils Relative: 0 %
Eosinophils Absolute: 0.3 10*3/uL (ref 0.0–0.7)
Eosinophils Relative: 3 %
HCT: 32.2 % — ABNORMAL LOW (ref 39.0–52.0)
Hemoglobin: 10.4 g/dL — ABNORMAL LOW (ref 13.0–17.0)
Lymphocytes Relative: 14 %
Lymphs Abs: 1.2 10*3/uL (ref 0.7–4.0)
MCH: 27.2 pg (ref 26.0–34.0)
MCHC: 32.3 g/dL (ref 30.0–36.0)
MCV: 84.3 fL (ref 78.0–100.0)
Monocytes Absolute: 0.9 10*3/uL (ref 0.1–1.0)
Monocytes Relative: 10 %
Neutro Abs: 6.1 10*3/uL (ref 1.7–7.7)
Neutrophils Relative %: 73 %
Platelets: 182 10*3/uL (ref 150–400)
RBC: 3.82 MIL/uL — ABNORMAL LOW (ref 4.22–5.81)
RDW: 13.3 % (ref 11.5–15.5)
WBC: 8.3 10*3/uL (ref 4.0–10.5)

## 2017-07-05 LAB — BASIC METABOLIC PANEL
Anion gap: 8 (ref 5–15)
BUN: 35 mg/dL — ABNORMAL HIGH (ref 6–20)
CO2: 26 mmol/L (ref 22–32)
Calcium: 8.9 mg/dL (ref 8.9–10.3)
Chloride: 98 mmol/L — ABNORMAL LOW (ref 101–111)
Creatinine, Ser: 1.92 mg/dL — ABNORMAL HIGH (ref 0.61–1.24)
GFR calc Af Amer: 37 mL/min — ABNORMAL LOW (ref >60)
GFR calc non Af Amer: 32 mL/min — ABNORMAL LOW (ref >60)
Glucose, Bld: 254 mg/dL — ABNORMAL HIGH (ref 65–99)
Potassium: 4.4 mmol/L (ref 3.5–5.1)
Sodium: 132 mmol/L — ABNORMAL LOW (ref 135–145)

## 2017-07-05 LAB — GLUCOSE, CAPILLARY
Glucose-Capillary: 184 mg/dL — ABNORMAL HIGH (ref 65–99)
Glucose-Capillary: 185 mg/dL — ABNORMAL HIGH (ref 65–99)
Glucose-Capillary: 83 mg/dL (ref 65–99)
Glucose-Capillary: 213 mg/dL — ABNORMAL HIGH (ref 65–99)

## 2017-07-05 MED ORDER — SODIUM CHLORIDE 0.45 % IV SOLN
INTRAVENOUS | Status: DC
  Administered 2017-07-05 – 2017-07-06 (×2): via INTRAVENOUS

## 2017-07-05 NOTE — Progress Notes (Signed)
Physical Therapy Session Note  Patient Details  Name: Frank [name] MRN: [id number] Date of Birth: [birth year]  Today's Date: 07/05/2017 PT Group Time: 1420-1520 PT Group Time Calculation (min): 60 min  Short Term Goals: Week 1:  PT Short Term Goal 1 (Week 1): Pt will ambulated x 150 ft with LRAD and supervision assist PT Short Term Goal 2 (Week 1): Pt will perform sit<>stands with supervision  PT Short Term Goal 3 (Week 1): Pt will improve balance by demonstrating berg balance test score of at least 35/56   Skilled Therapeutic Interventions/Progress Updates:    Pt participated in walking and balance group with focus on functional gait in controlled and home environment (navigating obstacle course with lateral sidestepping and turns as well as in ADL apartment to use bathroom during session) with rollator and close supervision to occasional steady assist to address balance, gait, endurance, and safety with rollator in a social setting. Pt requires close supervision to min assistance throughout session with verbal cues for attention to easy distractibility (pt able to verbalize this but still requires cues for obstacle negotiation). Education provided on energy conservation, safe use of AD, and overall fall risk due to decreased balance. Pt engaged in dynamic standing balance and tolerance activity to play wii bowling with supervision/steadying assist with 1 UE support. TUG administered with rollator with average of 27.3 sec.    Therapy Documentation Precautions:  Precautions Precautions: Fall Precaution Comments: left bias; R chronic clavicle fx Required Braces or Orthoses: Sling(Sling for comfort only, pt declines use) Restrictions Weight Bearing Restrictions: No  Pain:  No complaints of pain and did not report any dizziness during session.    See Function Navigator for Current Functional Status.   Therapy/Group: Group Therapy  Canary Brim Ivory Broad, PT,  DPT  07/05/2017, 3:45 PM

## 2017-07-05 NOTE — Progress Notes (Signed)
Social Work Patient ID: Frank [name], male   DOB: Nov 29, 1940, 76 y.o.   MRN: [id number]  Left message for Workers Comp-adjuster Marlowe Kays to inform of goals and target discharge date. Await return call.

## 2017-07-05 NOTE — Progress Notes (Signed)
Occupational Therapy Session Note  Patient Details  Name: Frank [name] MRN: [id number] Date of Birth: [birth year]  Today's Date: 07/05/2017 OT Individual Time: 0700-0800 OT Individual Time Calculation (min): 60 min    Short Term Goals: Week 1:  OT Short Term Goal 1 (Week 1): Pt will complete grooming tasks from standing position with supervision OT Short Term Goal 2 (Week 1): Pt will bathe sit <> stand with min steadying assist OT Short Term Goal 3 (Week 1): Pt will utilize RW during functional ambulation and sit <> stands safely with VCs only OT Short Term Goal 4 (Week 1): Pt will complete 3/3 toileting tasks with guarding assist  Skilled Therapeutic Interventions/Progress Updates:     Pt seen for OT ADL bathing/dressing session. Pt asleep in supine upon arrival, easily awoken and agreeable to tx session. He transferred to EOB using bed rail with min A. Ambulated throughout room with rollator, doing well to remember to lock brakes approrpaitely. He gathered clothes from drawer in prep for showering task, requiring increased time to organize all items to ensure he had all needed clothing.  He bathed seated on tub bench with distant supervision following VCs to begin.  He ambulated out of bathroom to w/c to get dressed, cont to require VCs for safety awareness as pt frequently reaches very far outside BOS for items, sits prematurely and demonstrates poor insight into deficits and his high fall risk.  He dressed seated in w/c, guarding assist for standing to pull pants up and min cuing for problem solving.  He then ambulated throughout unit with rollator and supervision, pt veering L into rollator, cued to return to midline. Completed sit <> stand from low soft rocking recliner with increased time and supervision. During seated rest break, discussed d/c planning, pt desiring to return to work soon. Educated regarding physical as well as cognitive deficits and activity analysis of pt's  job. Pt aware MD must clear him to return to work. Pt returned to recliner at end of session, left set up with breakfast and all needs in reac.  Discussed d/c planning with recommednation for shower chair, pt reports he will purchase this independently and that they recently had grab bars installed.   Therapy Documentation Precautions:  Precautions Precautions: Fall Precaution Comments: left bias; R chronic clavicle fx Required Braces or Orthoses: Sling(Sling for comfort only, pt declines use) Restrictions Weight Bearing Restrictions: No Pain:   No/ denies pain  See Function Navigator for Current Functional Status.   Therapy/Group: Individual Therapy  Long Brimage L 07/05/2017, 6:37 AM

## 2017-07-05 NOTE — Progress Notes (Signed)
[facility] PHYSICAL MEDICINE & REHABILITATION     PROGRESS NOTE    Subjective/Complaints: Became dizzy in therapy, systolic went down to 66 per RN but went up to 90 after rest and po fluids Pt states he does not drink enough  ROS: Denies nausea, vomiting, diarrhea, shortness of breath or chest pain   Objective: Vital Signs: Blood pressure (!) 147/63, pulse 69, temperature 98 F (36.7 C), temperature source Oral, resp. rate 18, height 5\' 10"  (1.778 m), weight 90.2 kg (198 lb 13.7 oz), SpO2 95 %. No results found. Recent Labs    07/05/17 1057  WBC 8.3  HGB 10.4*  HCT 32.2*  PLT 182   Recent Labs    07/05/17 1057  NA 132*  K 4.4  CL 98*  GLUCOSE 254*  BUN 35*  CREATININE 1.92*  CALCIUM 8.9   CBG (last 3)  Recent Labs    07/04/17 2056 07/05/17 0622 07/05/17 1144  GLUCAP 116* 184* 185*    Wt Readings from Last 3 Encounters:  06/28/17 90.2 kg (198 lb 13.7 oz)  06/23/17 94.1 kg (207 lb 7.3 oz)  05/12/17 98.5 kg (217 lb 3.2 oz)    Physical Exam:  Constitutional: He appears well-developed. Obese  HENT: Normocephalic and atraumatic.  Eyes: EOM are normal. No discharge.  Cardiovascular: RRR. No JVD    Respiratory: CTA Bilaterally. Normal effort  GI: Bowel sounds are normal. He exhibits no distension.  Musculoskeletal: He exhibits no edema or tenderness.  Neurological: He is alert and oriented Normal insight and awarenes Motor:  RUE/RLE: 5/5 proximal to distal  LUE/LLE: 4+5/5 prox to distal.   Skin: Skin is warm and dry.  Psychiatric: pleasant and appropriate  Assessment/Plan: 1. Functional and mobility deficits secondary to right posterior parietal IPH which require 3+ hours per day of interdisciplinary therapy in a comprehensive inpatient rehab setting. Physiatrist is providing close team supervision and 24 hour management of active medical problems listed below. Physiatrist and rehab team continue to assess barriers to discharge/monitor patient progress  toward functional and medical goals.  Function:  Bathing Bathing position Bathing activity did not occur: Refused Position: Production manager parts bathed by patient: Right arm, Left arm, Chest, Abdomen, Front perineal area, Right upper leg, Buttocks, Left upper leg, Right lower leg, Left lower leg, Back Body parts bathed by helper: Back  Bathing assist Assist Level: Supervision or verbal cues      Upper Body Dressing/Undressing Upper body dressing   What is the patient wearing?: Pull over shirt/dress     Pull over shirt/dress - Perfomed by patient: Thread/unthread right sleeve, Thread/unthread left sleeve, Put head through opening, Pull shirt over trunk          Upper body assist Assist Level: Supervision or verbal cues   Set up : To obtain clothing/put away  Lower Body Dressing/Undressing Lower body dressing   What is the patient wearing?: Underwear, Pants, Socks, Shoes Underwear - Performed by patient: Pull underwear up/down, Thread/unthread left underwear leg, Thread/unthread right underwear leg Underwear - Performed by helper: Thread/unthread right underwear leg Pants- Performed by patient: Thread/unthread right pants leg, Thread/unthread left pants leg, Pull pants up/down Pants- Performed by helper: Thread/unthread right pants leg   Non-skid slipper socks- Performed by helper: Don/doff right sock, Don/doff left sock Socks - Performed by patient: Don/doff right sock, Don/doff left sock   Shoes - Performed by patient: Don/doff right shoe, Don/doff left shoe, Fasten right, Fasten left Shoes - Performed by helper: Fasten  left          Lower body assist Assist for lower body dressing: Touching or steadying assistance (Pt > 75%)      Toileting Toileting   Toileting steps completed by patient: Adjust clothing prior to toileting, Performs perineal hygiene, Adjust clothing after toileting Toileting steps completed by helper: Adjust clothing prior to toileting,  Adjust clothing after toileting Toileting Assistive Devices: Grab bar or rail  Toileting assist Assist level: Supervision or verbal cues   Transfers Chair/bed transfer   Chair/bed transfer method: Ambulatory Chair/bed transfer assist level: Supervision or verbal cues Chair/bed transfer assistive device: Armrests, Medical sales representative     Max distance: 234ft  Assist level: Touching or steadying assistance (Pt > 75%)   Wheelchair          Cognition Comprehension Comprehension assist level: Follows complex conversation/direction with extra time/assistive device  Expression Expression assist level: Expresses complex 90% of the time/cues < 10% of the time  Social Interaction Social Interaction assist level: Interacts appropriately with others with medication or extra time (anti-anxiety, antidepressant).  Problem Solving Problem solving assist level: Solves complex problems: With extra time  Memory Memory assist level: Recognizes or recalls 90% of the time/requires cueing < 10% of the time   Medical Problem List and Plan: 1.  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  DVT Prophylaxis/Anticoagulation: SCDs. Monitor for any signs of DVT.    Vascular study neg for DVT 3. Pain Management: Tylenol as needed 4. Mood: Prozac 20 mg daily. Provide emotional support 5. Neuropsych: This patient is capable of making decisions on his own behalf. 6. Skin/Wound Care: Routine skin checks 7. Fluids/Electrolytes/Nutrition:    Continue to push po.  8. Hypertension. Norvasc 10 mg daily, Cardura 2 mg daily at bedtime, Avapro 150 mg twice a day. Monitor with increased mobility   AM elevations, hydralazine 10 started qhs, with orthostasis yesterday and today will d/c     Vitals:   07/04/17 2013 07/05/17 0300  BP: 118/65 (!) 147/63  Pulse:  69  Resp:  18   Temp:  98 F (36.7 C)  SpO2:  95%   9. Diabetes mellitus peripheral neuropathy. Hemoglobin A1c 7.2. SSI. Check blood sugars before meals and at bedtime. Patient on Glucotrol XL 10 mg daily, Januvia 100 mg daily, Glucophage 1000 mg twice a day prior to admission.   Resumed Glucotrol XL on 12/19     Tradjenta 2.5 started on 12/22 CBG (last 3)  Recent Labs    07/04/17 2056 07/05/17 0622 07/05/17 1144  GLUCAP 116* 184* 185*    Improving but not at goal, no change in tx 12/24 10. BPH. The triptan 5 mg twice a day.  Emptying bladder 11. Hyperlipidemia. Lipitor 12. History of chronic right rotator cuff tear as well as right distal clavicle fracture. Conservative care. Weightbearing as tolerated. Shoulder sling for comfort as needed   -Does not appear to be a major issue at present 13. Hyponatremia   Na 132 on 12/21 stable 12/24     Cont to monitor 14. ABLA   Hb 11.4 on 12/18   Labs ordered for tomorrow 15. Acute on CKD   Cr 1.25 on 12/21, up to 1.92 12/24 16.  Start IVF at noc, ECHO shows gr 1 diastolic dysfunction, Ej fx normal, monitor for overload  LOS (Days)  [place] EVALUATION WAS PERFORMED  Charlett Blake, MD 07/05/2017 12:17 PM

## 2017-07-05 NOTE — Progress Notes (Signed)
Occupational Therapy Session Note  Patient Details  Name: Frank [name] MRN: [id number] Date of Birth: 05-Jul-1941  Today's Date: 07/05/2017 OT Individual Time: 1000-1030 OT Individual Time Calculation (min): 30 min    Short Term Goals: Week 1:  OT Short Term Goal 1 (Week 1): Pt will complete grooming tasks from standing position with supervision OT Short Term Goal 2 (Week 1): Pt will bathe sit <> stand with min steadying assist OT Short Term Goal 3 (Week 1): Pt will utilize RW during functional ambulation and sit <> stands safely with VCs only OT Short Term Goal 4 (Week 1): Pt will complete 3/3 toileting tasks with guarding assist  Skilled Therapeutic Interventions/Progress Updates:    Pt presented sitting up in recliner with no c/o pain agreeable to OT tx session. Pt completes sit<>stand with increased time/effort, ambulates using rollator to ADL apartment with close minGuard throughout, min verbal cues for L foot clearance. Pt transfers to recliner for seated rest break, requires MinA to stand from lower surface height of recliner (completing sit<>stand x3 from recliner during session). Pt engages in dynamic activity in kitchen reaching and obtaining items outside BOS with overall minGuard using rollator, one instance of MinA for increased stability while picking up dropped item from floor. Pt requires min verbal safety cues during activity completion with education provided throughout for increased safety using rollator, overall demonstrates good recall to lock brakes of rollator prior to reaching for an item. After seated rest break in recliner Pt stands at table to engage in game of solitaire with minGuard-MinA throughout for maintaining standing balance. Began to exit ADL apartment towards end of session, however Pt reporting increased dizziness, instructed Pt to sit EOB, requires mod verbal safety cues as Pt attempting to sit early. Provided chair and Pt transitioned to sitting in  chair with close MinGuard. Upon exiting room to obtain Pt's w/c and dinamap to monitor BP, PT arriving to begin tx session. Updated PT re: Pt's symptoms and handoff to PT to check BP/begin tx session.   Therapy Documentation Precautions:  Precautions Precautions: Fall Precaution Comments: left bias; R chronic clavicle fx Required Braces or Orthoses: Sling(Sling for comfort only, pt declines use) Restrictions Weight Bearing Restrictions: No  See Function Navigator for Current Functional Status.   Therapy/Group: Individual Therapy  [name] 07/05/2017, 1:13 PM

## 2017-07-05 NOTE — Progress Notes (Signed)
Physical Therapy Session Note  Patient Details  Name: Frank [name] MRN: [id number] Date of Birth: July 27, 1940  Today's Date: 07/05/2017 PT Individual Time: 1030-1130 PT Individual Time Calculation (min): 60 min   Short Term Goals: Week 1:  PT Short Term Goal 1 (Week 1): Pt will ambulated x 150 ft with LRAD and supervision assist PT Short Term Goal 2 (Week 1): Pt will perform sit<>stands with supervision  PT Short Term Goal 3 (Week 1): Pt will improve balance by demonstrating berg balance test score of at least 35/56   Skilled Therapeutic Interventions/Progress Updates:    Pt seated in ADL apartment finishing up OT session. Pt reports feeling slightly dizzy. BP 99/49. Encouraged pt to drink water and pt performed seated therex x 10 reps. BP 66/34. Encouraged more fluids and therex, BP 79/39. Stand pivot transfer from w/c to recliner with CGA for balance. Seated and long-sitting therex x 10-15 reps. Pt's BP at 93/43 at conclusion of therapy session, nursing notified of low BP throughout therapy session. Deferred standing activities this session due to low BP this AM. Pt left seated in recliner with needs in reach.  Therapy Documentation Precautions:  Precautions Precautions: Fall Precaution Comments: left bias; R chronic clavicle fx Required Braces or Orthoses: Sling(Sling for comfort only, pt declines use) Restrictions Weight Bearing Restrictions: No  See Function Navigator for Current Functional Status.   Therapy/Group: Individual Therapy  Excell Seltzer, PT, DPT  07/05/2017, 12:29 PM

## 2017-07-06 DIAGNOSIS — I951 Orthostatic hypotension: Secondary | ICD-10-CM

## 2017-07-06 DIAGNOSIS — N189 Chronic kidney disease, unspecified: Secondary | ICD-10-CM

## 2017-07-06 DIAGNOSIS — N179 Acute kidney failure, unspecified: Secondary | ICD-10-CM

## 2017-07-06 LAB — GLUCOSE, CAPILLARY
Glucose-Capillary: 164 mg/dL — ABNORMAL HIGH (ref 65–99)
Glucose-Capillary: 258 mg/dL — ABNORMAL HIGH (ref 65–99)
Glucose-Capillary: 98 mg/dL (ref 65–99)
Glucose-Capillary: 210 mg/dL — ABNORMAL HIGH (ref 65–99)

## 2017-07-06 NOTE — Progress Notes (Signed)
[place] PHYSICAL MEDICINE & REHABILITATION     PROGRESS NOTE    Subjective/Complaints:BP lying to standing drop of 25mmHg per RN this am, mild dizziness during that time Received IVF last noc, urine clear per RN, no swelling in legs per pt  ROS: Denies nausea, vomiting, diarrhea, shortness of breath or chest pain   Objective: Vital Signs: Blood pressure (!) 153/70, pulse 74, temperature 99.1 F (37.3 C), temperature source Oral, resp. rate 19, height 5\' 10"  (1.778 m), weight 90.2 kg (198 lb 13.7 oz), SpO2 95 %. No results found. Recent Labs    07/05/17 1057  WBC 8.3  HGB 10.4*  HCT 32.2*  PLT 182   Recent Labs    07/05/17 1057  NA 132*  K 4.4  CL 98*  GLUCOSE 254*  BUN 35*  CREATININE 1.92*  CALCIUM 8.9   CBG (last 3)  Recent Labs    07/05/17 1635 07/05/17 2127 07/06/17 0635  GLUCAP 83 213* 164*    Wt Readings from Last 3 Encounters:  06/28/17 90.2 kg (198 lb 13.7 oz)  06/23/17 94.1 kg (207 lb 7.3 oz)  05/12/17 98.5 kg (217 lb 3.2 oz)    Physical Exam:  Constitutional: He appears well-developed. Obese  HENT: Normocephalic and atraumatic.  Eyes: EOM are normal. No discharge.  Cardiovascular: RRR. No JVD    Respiratory: CTA Bilaterally. Normal effort  GI: Bowel sounds are normal. He exhibits no distension.  Musculoskeletal: He exhibits no edema or tenderness.  Neurological: He is alert and oriented Normal insight and awarenes Motor:  RUE/RLE: 5/5 proximal to distal  LUE/LLE: 4+5/5 prox to distal.   Skin: Skin is warm and dry.  Psychiatric: pleasant and appropriate Ext no C/C/E Assessment/Plan: 1. Functional and mobility deficits secondary to right posterior parietal IPH which require 3+ hours per day of interdisciplinary therapy in a comprehensive inpatient rehab setting. Physiatrist is providing close team supervision and 24 hour management of active medical problems listed below. Physiatrist and rehab team continue to assess barriers to  discharge/monitor patient progress toward functional and medical goals.  Function:  Bathing Bathing position Bathing activity did not occur: Refused Position: Production manager parts bathed by patient: Right arm, Left arm, Chest, Abdomen, Front perineal area, Right upper leg, Buttocks, Left upper leg, Right lower leg, Left lower leg, Back Body parts bathed by helper: Back  Bathing assist Assist Level: Supervision or verbal cues      Upper Body Dressing/Undressing Upper body dressing   What is the patient wearing?: Pull over shirt/dress     Pull over shirt/dress - Perfomed by patient: Thread/unthread right sleeve, Thread/unthread left sleeve, Put head through opening, Pull shirt over trunk          Upper body assist Assist Level: Supervision or verbal cues   Set up : To obtain clothing/put away  Lower Body Dressing/Undressing Lower body dressing   What is the patient wearing?: Underwear, Pants, Socks, Shoes Underwear - Performed by patient: Pull underwear up/down, Thread/unthread left underwear leg, Thread/unthread right underwear leg Underwear - Performed by helper: Thread/unthread right underwear leg Pants- Performed by patient: Thread/unthread right pants leg, Thread/unthread left pants leg, Pull pants up/down Pants- Performed by helper: Thread/unthread right pants leg   Non-skid slipper socks- Performed by helper: Don/doff right sock, Don/doff left sock Socks - Performed by patient: Don/doff right sock, Don/doff left sock   Shoes - Performed by patient: Don/doff right shoe, Don/doff left shoe, Fasten right, Fasten left Shoes - Performed  by helper: Fasten left          Lower body assist Assist for lower body dressing: Touching or steadying assistance (Pt > 75%)      Toileting Toileting   Toileting steps completed by patient: Adjust clothing prior to toileting, Performs perineal hygiene, Adjust clothing after toileting Toileting steps completed by helper:  Adjust clothing prior to toileting, Adjust clothing after toileting Toileting Assistive Devices: Grab bar or rail  Toileting assist Assist level: Supervision or verbal cues   Transfers Chair/bed transfer   Chair/bed transfer method: Stand pivot Chair/bed transfer assist level: Touching or steadying assistance (Pt > 75%) Chair/bed transfer assistive device: Armrests     Locomotion Ambulation     Max distance: 241ft  Assist level: Touching or steadying assistance (Pt > 75%)   Wheelchair          Cognition Comprehension Comprehension assist level: Follows complex conversation/direction with extra time/assistive device  Expression Expression assist level: Expresses complex 90% of the time/cues < 10% of the time  Social Interaction Social Interaction assist level: Interacts appropriately with others with medication or extra time (anti-anxiety, antidepressant).  Problem Solving Problem solving assist level: Solves complex problems: With extra time  Memory Memory assist level: Recognizes or recalls 90% of the time/requires cueing < 10% of the time   Medical Problem List and Plan: 1.  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  DVT Prophylaxis/Anticoagulation: SCDs. Monitor for any signs of DVT.    Vascular study neg for DVT 3. Pain Management: Tylenol as needed 4. Mood: Prozac 20 mg daily. Provide emotional support 5. Neuropsych: This patient is capable of making decisions on his own behalf. 6. Skin/Wound Care: Routine skin checks 7. Fluids/Electrolytes/Nutrition:    Continue to push po.  8. Hypertension. Norvasc 10 mg daily, Cardura 2 mg daily at bedtime, Avapro 150 mg twice a day. Monitor with increased mobility   AM elevations, hydralazine d/c with some improvement symptomatically also received IVF    Vitals:   07/05/17 2032 07/06/17 0204  BP:  (!) 124/53 (!) 153/70  Pulse: 70 74  Resp:  19  Temp:  99.1 F (37.3 C)  SpO2:  95%   9. Diabetes mellitus peripheral neuropathy. Hemoglobin A1c 7.2. SSI. Check blood sugars before meals and at bedtime. Patient on Glucotrol XL 10 mg daily, Januvia 100 mg daily, Glucophage 1000 mg twice a day prior to admission.   Resumed Glucotrol XL on 12/19     Tradjenta 2.5 started on 12/22 Will not restart metformin unless Creat normalizes CBG (last 3)  Recent Labs    07/05/17 1635 07/05/17 2127 07/06/17 0635  GLUCAP 83 213* 164*    Improving but not at goal, no change in tx 12/25 10. BPH. The triptan 5 mg twice a day.  Emptying bladder 11. Hyperlipidemia. Lipitor 12. History of chronic right rotator cuff tear as well as right distal clavicle fracture. Conservative care. Weightbearing as tolerated. Shoulder sling for comfort as needed   -Does not appear to be a major issue at present 13. Hyponatremia   Na 132 on 12/21 stable 12/24     Cont to monitor 14. ABLA   Hb 11.4 on 12/18    15. Acute on CKD   Cr 1.25 on 12/21, up to 1.92 12/24   Cont IVF at noc, ECHO shows gr 1 diastolic dysfunction, Ej  fx normal, monitor for overload Recheck BMET in am  LOS (Days) [place] EVALUATION WAS PERFORMED  Charlett Blake, MD 07/06/2017 9:19 AM

## 2017-07-06 NOTE — Progress Notes (Signed)
Pt noted dizziness when sitting up. Orthostatic vitals taken  Pt noted to have increased dizziness w movement and  a 35 pt decrease in systolic and 17 pt decrease  in Diastolic B/Ps Dr Letta Pate informed, and placed  new Orders will continue to monitor.

## 2017-07-07 ENCOUNTER — Inpatient Hospital Stay (HOSPITAL_COMMUNITY): Payer: Self-pay | Admitting: Physical Therapy

## 2017-07-07 ENCOUNTER — Inpatient Hospital Stay (HOSPITAL_COMMUNITY): Payer: Self-pay | Admitting: Occupational Therapy

## 2017-07-07 LAB — BASIC METABOLIC PANEL
Anion gap: 7 (ref 5–15)
BUN: 29 mg/dL — ABNORMAL HIGH (ref 6–20)
CO2: 26 mmol/L (ref 22–32)
Calcium: 8.8 mg/dL — ABNORMAL LOW (ref 8.9–10.3)
Chloride: 99 mmol/L — ABNORMAL LOW (ref 101–111)
Creatinine, Ser: 1.2 mg/dL (ref 0.61–1.24)
GFR calc Af Amer: >60 mL/min (ref >60)
GFR calc non Af Amer: 56 mL/min — ABNORMAL LOW (ref >60)
Glucose, Bld: 159 mg/dL — ABNORMAL HIGH (ref 65–99)
Potassium: 4.4 mmol/L (ref 3.5–5.1)
Sodium: 132 mmol/L — ABNORMAL LOW (ref 135–145)

## 2017-07-07 LAB — GLUCOSE, CAPILLARY
Glucose-Capillary: 153 mg/dL — ABNORMAL HIGH (ref 65–99)
Glucose-Capillary: 247 mg/dL — ABNORMAL HIGH (ref 65–99)
Glucose-Capillary: 117 mg/dL — ABNORMAL HIGH (ref 65–99)
Glucose-Capillary: 241 mg/dL — ABNORMAL HIGH (ref 65–99)

## 2017-07-07 MED ORDER — LINAGLIPTIN 5 MG PO TABS
5.0000 mg | ORAL_TABLET | Freq: Every day | ORAL | Status: DC
  Administered 2017-07-08 – 2017-07-09 (×2): 5 mg via ORAL
  Filled 2017-07-07 (×2): qty 1

## 2017-07-07 NOTE — Progress Notes (Signed)
Physical Therapy Session Note  Patient Details  Name: Frank [name] MRN: [id number] Date of Birth: 11-07-1940  Today's Date: 07/07/2017 PT Individual Time: 0830-0930 AND 1300-1415 PT Individual Time Calculation (min): 60 min   Short Term Goals: Week 1:  PT Short Term Goal 1 (Week 1): Pt will ambulated x 150 ft with LRAD and supervision assist PT Short Term Goal 2 (Week 1): Pt will perform sit<>stands with supervision  PT Short Term Goal 3 (Week 1): Pt will improve balance by demonstrating berg balance test score of at least 35/56   Skilled Therapeutic Interventions/Progress Updates:   Pt received sitting EOB and agreeable. PT assessed orthostatic BP. Sitting: 114/93. Standing 114/54. PT assisted pt to don ted hose and pants. Orthostatics re-assessed: sitting: 114/62. Standing 118/54. Pt performed ambulatory transfer to [facility] with Rollator and distant supervision assist. PT transported to rehab gym. Pt reports need for urination. Gait through hall to public restroom with rollator and distant supervision assist. Pt able to complete toilet trasfer and personal hygiene with supervision assist from PT. PT applied Abdominal binder and Orthostatics re-assessed. Sitting: 134/58. Standing at 25mn: 120/46. Standing at 3 minutes 121/51. Pt asymptomatic throughout treatment.  PT instructed pt in gait training with Rollator x [id number] supervision assist from PT; pt noted to maintain upright posture, symmetrical stride length, and improved gait pattern in turns to minimize shuffle. Patient returned to room and [facility] reach and all needs met.    Pt received sitting in WC and agreeable to PT. [facility] instructed pt in blocked practice sit<>supine bed mobility to R and L. Pt required supervision assist into supine and min assist to sitting through L side. Supervision assist to sitting through R side. Pt reports signifcant increased in dizziness  when lying down to the L. Mild to no symptoms on the L. PT transported pt in orthogym. PT performed DixHalpike BPPV assessment. Pt positive(+) for posterior L BPPV with rotational Nystagmus and significant dizzness. PT progressed to Elpy maneuver for treatment of BPPV with increased dizziness with each new position. Following BPPV treatment, PT instructed pt in   Gait training x [id number] distant supervision assist and BUE support on rollator. Min cues prior to turning to sit for AD management.        Therapy Documentation Precautions:  Precautions Precautions: Fall Precaution Comments: left bias; R chronic clavicle fx Required Braces or Orthoses: Sling(Sling for comfort only, pt declines use) Restrictions Weight Bearing Restrictions: No Pain:0/10   See Function Navigator for Current Functional Status.   Therapy/Group: Individual Therapy  AusLorie Phenix/26/2018, 9:29 AM

## 2017-07-07 NOTE — Patient Care Conference (Signed)
Inpatient RehabilitationTeam Conference and Plan of Care Update Date: 07/07/2017   Time: 10:50 AM    Patient Name: Frank [name]      Medical Record Number: [id number]  Date of Birth: Jul 04, 1941 Sex: Male         Room/Bed: 4W25C/4W25C-01 Payor Info: Payor: [employer] MEDICARE / Plan: [facility] MEDICARE / Product Type: *No Product type* /    Admitting Diagnosis: Fall  Admit Date/Time:  06/28/2017  1:46 PM Admission Comments: No comment available   Primary Diagnosis:  <principal problem not specified> Principal Problem: <principal problem not specified>  Patient Active Problem List   Diagnosis Date Noted  . Stage 3 chronic kidney disease ([place])   . Acute blood loss anemia   . Stage 2 chronic kidney disease   . Hyponatremia   . Traumatic intracranial hemorrhage ([place]) 06/28/2017  . H/O clavicle fracture   . Depression   . Benign essential HTN   . Type 2 diabetes mellitus with peripheral neuropathy ([name])   . Hyperlipidemia   . Intracranial hematoma following injury ([place]) 06/23/2017  . ICH (intracerebral hemorrhage) ([place]) 06/23/2017  . Morbid obesity ([place]) 05/12/2017  . OAB (overactive bladder) 11/26/2014  . Routine general medical examination at a health care facility 04/06/2014  . Vitamin D deficiency 07/23/2010  . Obstructive sleep apnea 09/13/2008  . Hyperlipidemia with target LDL less than 100 07/27/2007  . BPH (benign prostatic hyperplasia) 06/21/2007  . 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 Type II diabetes mellitus with manifestations ([place]) 04/15/2006  . B12 deficiency anemia 04/15/2006  . Essential hypertension 04/15/2006  . ALLERGIC RHINITIS 04/15/2006    Expected Discharge Date: Expected Discharge Date: 07/09/17  Team Members Present: Physician leading conference: Dr. Alysia Penna Social Worker Present: Ovidio Kin, LCSW Nurse Present: Other (comment)(Lisa Lucius Conn) PT Present: Phylliss Bob, PTA;Barrie Folk, PT OT Present: Napoleon Form,  OT SLP Present: Charolett Bumpers, SLP PPS Coordinator present : Daiva Nakayama, RN, [place] [facility]     Current Status/Progress Goal Weekly Team Focus  Medical   Balance improving , azotemia resolved, orthostatic hypotension improving  Control BP and CBGs  fluid management and diabetic med adjustment   Bowel/Bladder   Continent of bowel-LBM 12/24, spills urinal occasionally at HS. Taking ditropan at [facility] without constipation  Time toilet at [facility]   Swallow/Nutrition/ Hydration             ADL's   Supervision-mod I overall, requires min cuing for safety  Mod I overall, supervision shower stall transfer and bathing  ADL re-training, education, d/c planning   Mobility   Supervision assist overall using rollator for gait and transfers. Occassional min assist for bed mobiltiy secondary to dizzness.   Mod I-supervision assist with LRAD   improved safety awareness, increased independence with gait and transfers.    Communication             Safety/Cognition/ Behavioral Observations            Pain   without complaint of pain  pain < 2 on pain scale  assess pain q shift    Skin   abrasion basically healed  no new skin breakdown this admission  assess skin q shift      *See Care Plan and progress notes for long and short-term goals.     Barriers to Discharge  Current Status/Progress Possible Resolutions Date Resolved   Physician    Medical stability     progressing toward goals  Nursing                  PT                    OT                  SLP                SW                Discharge Planning/Teaching Needs:  HOme with wife who can provide supervision level. Pt wants to return to work unsure how realistic this is.      Team Discussion:  Goals supervision-mod/i level. Dizzy yesterday MD ordered teds and abdominal binder when out of bed. Adjusting BP meds. Labs look better today. Some safety awareness issues, would benefit from 24 hr supervision level at  discharge.   Revisions to Treatment Plan:  DC 12/28    Continued Need for Acute Rehabilitation Level of Care: The patient requires daily medical management by a physician with specialized training in physical medicine and rehabilitation for the following conditions: Daily direction of a multidisciplinary physical rehabilitation program to ensure safe treatment while eliciting the highest outcome that is of practical value to the patient.: Yes Daily medical management of patient stability for increased activity during participation in an intensive rehabilitation regime.: Yes Daily analysis of laboratory values and/or radiology reports with any subsequent need for medication adjustment of medical intervention for : Diabetes problems;Neurological problems;Blood pressure problems  Kieley Akter, Gardiner Rhyme 07/08/2017, 3:00 PM

## 2017-07-07 NOTE — Progress Notes (Signed)
Occupational Therapy Session Note  Patient Details  Name: Frank [name] MRN: [id number] Date of Birth: 12-28-1940  Today's Date: 07/07/2017 OT Individual Time: 1500-1600 OT Individual Time Calculation (min): 60 min    Short Term Goals: Week 1:  OT Short Term Goal 1 (Week 1): Pt will complete grooming tasks from standing position with supervision OT Short Term Goal 2 (Week 1): Pt will bathe sit <> stand with min steadying assist OT Short Term Goal 3 (Week 1): Pt will utilize RW during functional ambulation and sit <> stands safely with VCs only OT Short Term Goal 4 (Week 1): Pt will complete 3/3 toileting tasks with guarding assist  Skilled Therapeutic Interventions/Progress Updates:    Pt seen for OT session focusing on functional transfers and d/c planning and education. Pt sitting up in recliner upon arrival, agreeable to tx session. Assessed BP: Sitting with abdominal binder and TEDS: 101/44 Standing with abdominal binder and TEDS: 105/57 Pt asymptomatic in all positions. He ambulated throughout room with rollator and supervision, requiring intermittent cuing for appropriate use of rollator brakes and positioning. Completed toileting task with supervision using grab bar for support. He then ambulated to ADL apartment with supervision. Discussed pt's home bathroom layout, pt unable to clearly describe bathroom/ shower layout, therefore practiced simulated shower stall transfer from several possible entrances. Pt reports that grab bars have been installed in his home bathroom during the time of his hospital admission. Requested pt have wife take pictures in order to better simulate home environment. Also recommending to pt to practice a "dry run shower" prior to real showering task at home. Close supervision-min A with VCs for technique for simulated transfer with pt using hallway railing as grab bar. Printed picture of recommendation for shower chair for home use with arm and back  rest. Cont to discuss with pt his high fall risk and recommendation for use of rollator at all times as pt asked several times throughout session whether rollator would be needed at home. Practiced ambulation without AD in hallway, completed with min A, however, once in room, pt reaching outside BOS for furniture to stabilize self on, reviewed high safety/ fall risk this behavior can cause. Pt's son arrived at end of session and therapist reviewed all recommendations with pt's son. Pt left in recliner at end of session, all needs in reach and son present.     Therapy Documentation Precautions:  Precautions Precautions: Fall Precaution Comments: left bias; R chronic clavicle fx Required Braces or Orthoses: Sling(Sling for comfort only, pt declines use) Restrictions Weight Bearing Restrictions: No Pain:   No/ denies pain  See Function Navigator for Current Functional Status.   Therapy/Group: Individual Therapy  Kashius Dominic L 07/07/2017, 7:19 AM

## 2017-07-07 NOTE — Progress Notes (Signed)
Rested good. CPAP in place. 1/2 NS at 75cc/hr infusing. Without complaint of pain.Frank [name]

## 2017-07-07 NOTE — Progress Notes (Signed)
[place] PHYSICAL MEDICINE & REHABILITATION     PROGRESS NOTE    Subjective/Complaints: No further orthostatic drops, discussed with PT, RN, and pt  ROS: Denies nausea, vomiting, diarrhea, shortness of breath or chest pain   Objective: Vital Signs: Blood pressure 129/76, pulse 65, temperature 98.3 F (36.8 C), temperature source Oral, resp. rate 18, height 5' 10"  (1.778 m), weight 93.9 kg (207 lb 0.2 oz), SpO2 95 %. No results found. Recent Labs    07/05/17 1057  WBC 8.3  HGB 10.4*  HCT 32.2*  PLT 182   Recent Labs    07/05/17 1057 07/07/17 0540  NA 132* 132*  K 4.4 4.4  CL 98* 99*  GLUCOSE 254* 159*  BUN 35* 29*  CREATININE 1.92* 1.20  CALCIUM 8.9 8.8*   CBG (last 3)  Recent Labs    07/06/17 1642 07/06/17 2150 07/07/17 0632  GLUCAP 98 210* 153*    Wt Readings from Last 3 Encounters:  07/07/17 93.9 kg (207 lb 0.2 oz)  06/23/17 94.1 kg (207 lb 7.3 oz)  05/12/17 98.5 kg (217 lb 3.2 oz)    Physical Exam:  Constitutional: He appears well-developed. Obese  HENT: Normocephalic and atraumatic.  Eyes: EOM are normal. No discharge.  Cardiovascular: RRR. No JVD    Respiratory: CTA Bilaterally. Normal effort  GI: Bowel sounds are normal. He exhibits no distension.  Musculoskeletal: He exhibits no edema or tenderness.  Neurological: He is alert and oriented Normal insight and awarenes Motor:  RUE/RLE: 5/5 proximal to distal  LUE/LLE: 4+5/5 prox to distal.   Skin: Skin is warm and dry.  Psychiatric: pleasant and appropriate Ext no C/C/E Assessment/Plan: 1. Functional and mobility deficits secondary to right posterior parietal IPH which require 3+ hours per day of interdisciplinary therapy in a comprehensive inpatient rehab setting. Physiatrist is providing close team supervision and 24 hour management of active medical problems listed below. Physiatrist and rehab team continue to assess barriers to discharge/monitor patient progress toward functional and  medical goals.  Function:  Bathing Bathing position Bathing activity did not occur: Refused Position: Production manager parts bathed by patient: Right arm, Left arm, Chest, Abdomen, Front perineal area, Right upper leg, Buttocks, Left upper leg, Right lower leg, Left lower leg, Back Body parts bathed by helper: Back  Bathing assist Assist Level: Supervision or verbal cues      Upper Body Dressing/Undressing Upper body dressing   What is the patient wearing?: Pull over shirt/dress     Pull over shirt/dress - Perfomed by patient: Thread/unthread right sleeve, Thread/unthread left sleeve, Put head through opening, Pull shirt over trunk          Upper body assist Assist Level: Supervision or verbal cues   Set up : To obtain clothing/put away  Lower Body Dressing/Undressing Lower body dressing   What is the patient wearing?: Underwear, Pants, Socks, Shoes Underwear - Performed by patient: Pull underwear up/down, Thread/unthread left underwear leg, Thread/unthread right underwear leg Underwear - Performed by helper: Thread/unthread right underwear leg Pants- Performed by patient: Thread/unthread right pants leg, Thread/unthread left pants leg, Pull pants up/down Pants- Performed by helper: Thread/unthread right pants leg   Non-skid slipper socks- Performed by helper: Don/doff right sock, Don/doff left sock Socks - Performed by patient: Don/doff right sock, Don/doff left sock   Shoes - Performed by patient: Don/doff right shoe, Don/doff left shoe, Fasten right, Fasten left Shoes - Performed by helper: Fasten left  Lower body assist Assist for lower body dressing: Touching or steadying assistance (Pt > 75%)      Toileting Toileting   Toileting steps completed by patient: Adjust clothing prior to toileting, Performs perineal hygiene, Adjust clothing after toileting Toileting steps completed by helper: Adjust clothing prior to toileting, Adjust clothing after  toileting Toileting Assistive Devices: Grab bar or rail  Toileting assist Assist level: Supervision or verbal cues   Transfers Chair/bed transfer   Chair/bed transfer method: Stand pivot Chair/bed transfer assist level: Touching or steadying assistance (Pt > 75%) Chair/bed transfer assistive device: Armrests     Locomotion Ambulation     Max distance: 275f  Assist level: Touching or steadying assistance (Pt > 75%)   Wheelchair          Cognition Comprehension Comprehension assist level: Follows complex conversation/direction with extra time/assistive device  Expression Expression assist level: Expresses complex 90% of the time/cues < 10% of the time  Social Interaction Social Interaction assist level: Interacts appropriately with others with medication or extra time (anti-anxiety, antidepressant).  Problem Solving Problem solving assist level: Solves complex problems: With extra time  Memory Memory assist level: Recognizes or recalls 90% of the time/requires cueing < 10% of the time   Medical Problem List and Plan: 1.  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 Formulized individual treatment plan based on medical history, underlying problem and comorbidities.  2.  DVT Prophylaxis/Anticoagulation: SCDs. Monitor for any signs of DVT.    Vascular study neg for DVT 3. Pain Management: Tylenol as needed 4. Mood: Prozac 20 mg daily. Provide emotional support 5. Neuropsych: This patient is capable of making decisions on his own behalf. 6. Skin/Wound Care: Routine skin checks 7. Fluids/Electrolytes/Nutrition:    Continue to push po.  8. Hypertension. Norvasc 10 mg daily, Cardura 2 mg daily at bedtime,  Avapro 150 mg twice a day. Monitor with increased mobility   Orthostatic hypotension, sympptoms improved after IVF and d/c hydralazine    Vitals:   07/06/17 2216 07/07/17 0146  BP: (!) 160/77 129/76  Pulse:  65  Resp:  18  Temp:  98.3 F (36.8 C)  SpO2:  95%   9. Diabetes mellitus peripheral neuropathy. Hemoglobin A1c 7.2. SSI. Check blood sugars before meals and at bedtime. Patient on Glucotrol XL 10 mg daily, Januvia 100 mg daily, Glucophage 1000 mg twice a day prior to admission.   Resumed Glucotrol XL on 12/19     Tradjenta 2.5 started on 12/22 Will not restart metformin unless Creat normalizes CBG (last 3)  Recent Labs    07/06/17 1642 07/06/17 2150 07/07/17 0632  GLUCAP 98 210* 153*    Generally staying <180 with daily spike>200, increase tradjenta 10. BPH. cardura 278mdaily  Emptying bladder, monitor emptying on ditropan 11. Hyperlipidemia. Lipitor 12. History of chronic right rotator cuff tear as well as right distal clavicle fracture. Conservative care. Weightbearing as tolerated. Shoulder sling for comfort as needed   -Does not appear to be a major issue at present 13. Hyponatremia   Na 132 on 12/21 stable 12/26 despite hypotonic saline     No need to monitor serially 14. ABLA   Hb 11.4 on 12/18    15. Acute on CKD   Cr 1.25 on  12/21, up to 1.92 12/24, Cr 1.2 on 12/26 post IV hydration D/C IVF at noc,enc po fluid LOS (Days) [facility] EVALUATION WAS PERFORMED  Charlett Blake, MD 07/07/2017 9:38 AM

## 2017-07-08 ENCOUNTER — Inpatient Hospital Stay (HOSPITAL_COMMUNITY): Payer: Worker's Compensation

## 2017-07-08 ENCOUNTER — Inpatient Hospital Stay (HOSPITAL_COMMUNITY): Payer: Self-pay | Admitting: Occupational Therapy

## 2017-07-08 ENCOUNTER — Inpatient Hospital Stay (HOSPITAL_COMMUNITY): Payer: Self-pay | Admitting: Physical Therapy

## 2017-07-08 LAB — GLUCOSE, CAPILLARY
Glucose-Capillary: 146 mg/dL — ABNORMAL HIGH (ref 65–99)
Glucose-Capillary: 163 mg/dL — ABNORMAL HIGH (ref 65–99)
Glucose-Capillary: 145 mg/dL — ABNORMAL HIGH (ref 65–99)
Glucose-Capillary: 250 mg/dL — ABNORMAL HIGH (ref 65–99)

## 2017-07-08 NOTE — Discharge Summary (Signed)
[name], [name] NO.:  [id number]  MEDICAL RECORD NO.:  [id number]  LOCATION:  4W25C                        FACILITY:  [facility]  PHYSICIAN:  [name], M.D.DATE OF BIRTH:  08-27-1940  DATE OF ADMISSION:  06/28/2017 DATE OF DISCHARGE:  07/09/2017                              DISCHARGE SUMMARY   DISCHARGE DIAGNOSES: 1. Right posterior parietal intraparenchymal hemorrhage with reported     history of multiple falls. 2. SCDs for deep venous thrombosis prophylaxis and pain management. 3. Mood. 4. Hypertension. 5. Diabetes mellitus. 6. Peripheral neuropathy. 7. Hyperlipidemia. 8. History of chronic right rotator cuff tear as well as right distal     clavicle fracture. 9. Hyponatremia. 10.Acute blood loss anemia. 11.Acute on chronic kidney disease.  HISTORY OF PRESENT ILLNESS:  This is a 76 year old right-handed male with history of diabetes mellitus, hypertension, and right rotator cuff tear followed by [name].  He presented on June 23, 2017 with multiple falls over the past year.  Latest was when he slipped on the ice after becoming dizzy.  He lives with spouse independent prior to admission.  Cranial CT scan showed a right parietal hemorrhage, area of hemorrhage in the posterior right parietal lobe measuring 3.2 x 1.8 cm. No significant mass effect.  Cervical spine films were negative. Followup MRI identifies a 3.7 right parietal hemorrhage with unchanged mild surrounding edema and no mass effect.  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  Echocardiogram with ejection fraction of 60% and grade 1 diastolic dysfunction.  Carotid Dopplers with no ICA stenosis.  X-rays of sacrum showed question of upper coccygeal nondisplaced fracture, advised conservative care per [name].  Also, noted history of distal right clavicle fracture, chronic in nature, nonunion.  Again, weightbearing as tolerated and conservative care.  The  patient was admitted for a comprehensive rehabilitation program.  PAST MEDICAL HISTORY:  See discharge diagnoses.  SOCIAL HISTORY:  He lives with spouse.  Reported to be independent prior to admission with reported multiple falls over the past year.  FUNCTIONAL HISTORY:  Upon admission to [name] with moderate assist, 100 feet with rolling walker; moderate assist for sit-to-stand; and moderate-to-maximum assist for activities of daily living.  PHYSICAL EXAMINATION:  VITAL SIGNS:  Blood pressure of 135/65, pulse of 69, temperature of 98, and respirations of 20. GENERAL:  Alert male, in no acute distress.  Oriented to person, place, and time.  EOMs are intact. NECK:  Supple.  Nontender.  No jugular venous distention. CARDIAC:  Rate controlled. GASTROINTESTINAL:  Abdomen is soft and nontender.  Good bowel sounds. PULMONARY:  Lungs are clear to auscultation without wheeze. EXTREMITIES:  Muscle strength in right upper and lower extremities is 5/5 proximal and distal, left upper and lower is 4+/5 proximal to distal.  REHABILITATION HOSPITAL COURSE:  The patient was admitted to [name] with therapies initiated on a 3-hour daily basis consisting of physical therapy, occupational therapy, and rehabilitation nursing.  The following issues were addressed during the patient's rehabilitation stay.  Pertaining to [name] traumatic right posterior parietal intraparenchymal hemorrhage, remained stable. Participating fully with therapies, he would follow up with Neu[name].  SCDs for deep venous thrombosis prophylaxis.  Vascular  studies are negative.  He remained on Prozac for history of depression attending full therapies.  Blood pressures are controlled on Norvasc, Cardura, and Avapro with no orthostatic changes.  He would follow up with his primary MD.  Blood sugars are monitored with hemoglobin A1c of 7.2 as he remained on Glucotrol as well as Tradjenta.  Her Glucophage was held due to some elevations in creatinine as well as Januvia. Patient should follow-up with her primary care provider.  Full diabetic teaching was completed.  During his rehabilitation course, his hydralazine had been discontinued due to initial bouts of orthostasis. Upon his admission to [name], he did receive a short run of IV fluids.  Blood pressures normalized, he would remain off hydralazine.  Continue Lipitor for hyperlipidemia.  Conservative care for chronic right rotator cuff tear and right distal clavicle fracture; would follow up with [facility] [name].  Hyponatremia was stable at 132.  Acute blood loss anemia, no bleeding episodes, 11.4 hemoglobin. Latest creatinine of 1.25 and up to 1.92.  He was monitored closely with his initial round of IV fluids.  Latest creatinine was 1.20.  The patient received weekly collaborative interdisciplinary team conferences to discuss estimated length of stay, family teaching, and any barriers to discharge.  He performed ambulatory transfers to wheelchair with rolling walker, distance supervision.  He ambulates 240 feet distance supervision with rolling walker.  He practiced sit-to-supine and bed mobility with supervision.  He required supervision assist into supine and minimal assist to sitting to roll to the left side.  He could gather his belongings for activities of daily living and homemaking, dressing, grooming, and hygiene.  Full family teaching was completed and plan was discharge to home.  DISCHARGE MEDICATIONS:  Included 1. Norvasc 10 mg p.o. daily. 2. Lipitor 10 mg p.o. daily. 3. Cardura 2 mg at bedtime. 4. Prozac 20 mg p.o. daily. 5. Glucotrol 10 mg p.o. daily. 6. Avapro 150 mg p.o. b.i.d. 7. Tradjenta 5 mg p.o. daily. 8. Ditropan 5 mg p.o. b.i.d. 9. Protonix 40 mg p.o. daily. 10.Senokot S one tablet p.o. b.i.d.  DISCHARGE DIET:  His diet was a diabetic diet.   Special instructions.  Follow up with neurology [name] FrankXu on resuming aspirin therapy  DISCHARGE FOLLOWUP:  He would follow up with [facility] [name] at the outpatient rehab center as directed; Frank [name] of [name], call for appointment; Dr. Edmonia Lynch of [name], call for appointment; and Dr. Scarlette Calico for medical management.  DISCHARGE INSTRUCTIONS:  Special instructions of no driving.     Lauraine Rinne, P.A.   ______________________________ [name], M.D.    DA/MEDQ  D:  07/08/2017  T:  07/08/2017  Job:  [id number]  cc:   TIMOTHY DR. Romilda Joy, M.D. [name] Scarlette Calico, MD

## 2017-07-08 NOTE — Progress Notes (Signed)
Occupational Therapy Session Note  Patient Details  Name: Frank [name] MRN: [id number] Date of Birth: 1941/02/04  Today's Date: 07/08/2017 OT Individual Time: 0830-0900 OT Individual Time Calculation (min): 30 min    Short Term Goals: Week 1:  OT Short Term Goal 1 (Week 1): Pt will complete grooming tasks from standing position with supervision OT Short Term Goal 2 (Week 1): Pt will bathe sit <> stand with min steadying assist OT Short Term Goal 3 (Week 1): Pt will utilize RW during functional ambulation and sit <> stands safely with VCs only OT Short Term Goal 4 (Week 1): Pt will complete 3/3 toileting tasks with guarding assist  Skilled Therapeutic Interventions/Progress Updates:    1:1 focus on bed mobility to come to EOB, just dressing at EOB with focus on dynamic sitting balance and sit to stands without UE support. Pt required mod cuing sitting EOB to weight shift to the right to maintain both feet on the floor and both hips on the surface. Pt required extra time to thread pants but able to. Setup for donning TEDS and abdominal binder to aide with his dizziness. Pt perform transfer without rollator with supervision with extra time/    Therapy Documentation Precautions:  Precautions Precautions: Fall Precaution Comments: left bias; R chronic clavicle fx Required Braces or Orthoses: Sling(Sling for comfort only, pt declines use) Restrictions Weight Bearing Restrictions: No Pain:  no c/o pain  See Function Navigator for Current Functional Status.   Therapy/Group: Individual Therapy  Willeen Cass [facility] 07/08/2017, 2:31 PM

## 2017-07-08 NOTE — Progress Notes (Signed)
Social Work Patient ID: Joan Avetisyan, male   DOB: [birth year], 76 y.o.   MRN: [id number]  [place] with wife regarding pt's team conference goals and discharge date tomorrow 12/28. Have made several attempts to contact Workers Comp CM-Connie without success unsure if she is working this week and unable to leave voice mail due to mailbox is full. Will continue to attempt to contact but will go ahead and order pt's equipment. Wife only has Connie's number also.

## 2017-07-08 NOTE — Discharge Summary (Signed)
Discharge summary job [phone]

## 2017-07-08 NOTE — Progress Notes (Signed)
Physical Therapy Discharge Summary  Patient Details  Name: Frank [name] MRN: [id number] Date of Birth: 17-Jul-1940  Today's Date: 07/08/2017 PT Individual Time: 3149-7026 PT Individual Time Calculation (min): 70 min    Patient has met 6 of 7 long term goals due to improved activity tolerance, improved balance, improved postural control, increased strength, increased range of motion, ability to compensate for deficits, improved awareness and improved coordination.  Patient to discharge at an ambulatory level Modified Independent.   Patient's care partner is independent to provide the necessary physical and cognitive assistance at discharge.  Reasons goals not met: Pt continues to require cues for technique with bed mobility   Recommendation:  Patient will benefit from ongoing skilled PT services in home health setting to continue to advance safe functional mobility, address ongoing impairments in balance, awareness safety, gait, coordination, and minimize fall risk.  Equipment: rollator   Reasons for discharge: treatment goals met and discharge from hospital  Patient/family agrees with progress made and goals achieved: Yes   PT treatment PT instructed pt in Grad day assessment to measure progress toward goals. See below for details. Pt noted to have no dizziness with sit>supine to the L, but reports dizziness with sit>supine to the R. PT performed dix-halpike test for R posterior canal; positive(+) result with rotational nystagmus. Progress through [facility] for treatment. Pt left seated EOB with trade off for OdT.     PT Discharge Precautions/Restrictions Precautions Precautions: Fall Restrictions Weight Bearing Restrictions: No Vital Signs Therapy Vitals Patient Position (if appropriate): Orthostatic Vitals Oxygen Therapy SpO2: 97 % O2 Device: Not Delivered Pain Pain Assessment Pain Assessment: No/denies pain Vision/Perception  Vision - Assessment Eye  Alignment: Within Functional Limits Perception Perception: Impaired Inattention/Neglect: Does not attend to left side of body(mild L inattention) Praxis Praxis: Intact  Sensation Sensation Light Touch: Appears Intact Proprioception: Appears Intact Coordination Gross Motor Movements are Fluid and Coordinated: No Fine Motor Movements are Fluid and Coordinated: Yes Coordination and Movement Description: mild L discordination  Finger Nose Finger Test: WFL Motor  Motor Motor: Hemiplegia Motor - Discharge Observations: mild L hemiplegia. decreased coordination in the LLE  Mobility Bed Mobility Bed Mobility: Rolling Right;Rolling Left;Supine to Sit;Sit to Supine Rolling Right: 5: Supervision Rolling Left: 5: Supervision Supine to Sit: 5: Supervision Sit to Supine: 5: Supervision Transfers Transfers: Yes Sit to Stand: 6: Modified independent (Device/Increase time)(with Rollator) Stand Pivot Transfers: 6: Modified independent (Device/Increase time)(with rollator) Locomotion  Ambulation Ambulation: Yes Ambulation/Gait Assistance: 6: Modified independent (Device/Increase time) Ambulation Distance (Feet): 250 Feet Gait Gait: Yes Gait Pattern: Impaired Gait Pattern: Wide base of support Stairs / Additional Locomotion Stairs: Yes Stairs Assistance: 5: Supervision Stair Management Technique: Two rails Number of Stairs: 12 Height of Stairs: 6 Wheelchair Mobility Wheelchair Mobility: No  Trunk/Postural Assessment  Cervical Assessment Cervical Assessment: Exceptions to WFL(Forward head) Thoracic Assessment Thoracic Assessment: Exceptions to WFL(Kyphotic) Lumbar Assessment Lumbar Assessment: Within Functional Limits Postural Control Postural Control: Deficits on evaluation Protective Responses: delayed  Balance Berg Balance Test Sit to Stand: Able to stand  independently using hands Standing Unsupported: Able to stand 2 minutes with supervision Sitting with Back Unsupported  but Feet Supported on Floor or Stool: Able to sit safely and securely 2 minutes Stand to Sit: Controls descent by using hands Transfers: Able to transfer safely, definite need of hands Standing Unsupported with Eyes Closed: Able to stand 10 seconds with supervision Standing Ubsupported with Feet Together: Able to place feet together independently and stand for 1  minute with supervision From Standing, Reach Forward with Outstretched Arm: Can reach confidently >25 cm (10") From Standing Position, Pick up Object from Floor: Able to pick up shoe, needs supervision From Standing Position, Turn to Look Behind Over each Shoulder: Turn sideways only but maintains balance Turn 360 Degrees: Needs close supervision or verbal cueing Standing Unsupported, Alternately Place Feet on Step/Stool: Able to complete >2 steps/needs minimal assist Standing Unsupported, One Foot in Front: Able to take small step independently and hold 30 seconds Standing on One Leg: Tries to lift leg/unable to hold 3 seconds but remains standing independently Total Score: 36 Static Sitting Balance Static Sitting - Level of Assistance: 6: Modified independent (Device/Increase time) Dynamic Sitting Balance Dynamic Sitting - Level of Assistance: 6: Modified independent (Device/Increase time) Static Standing Balance Static Standing - Level of Assistance: 6: Modified independent (Device/Increase time) Dynamic Standing Balance Dynamic Standing - Level of Assistance: 5: Stand by assistance;6: Modified independent (Device/Increase time)(mod I with 1 UE support. supervision assist without UE support ) Extremity Assessment      RLE Assessment RLE Assessment: Within Functional Limits(strength grossly 4+/5 to 5/5 proximal to distal) LLE Assessment LLE Assessment: Exceptions to WFL(strength grossly 4+/5 throughout)   See Function Navigator for Current Functional Status.  Lorie Phenix 07/08/2017, 10:41 AM

## 2017-07-08 NOTE — Progress Notes (Signed)
Occupational Therapy Session Note  Patient Details  Name: Frank [name] MRN: [id number] Date of Birth: Oct 26, 1940  Today's Date: 07/08/2017 OT Individual Time: 1100-1200 OT Individual Time Calculation (min): 60 min    Short Term Goals: Week 1:  OT Short Term Goal 1 (Week 1): Pt will complete grooming tasks from standing position with supervision OT Short Term Goal 2 (Week 1): Pt will bathe sit <> stand with min steadying assist OT Short Term Goal 3 (Week 1): Pt will utilize RW during functional ambulation and sit <> stands safely with VCs only OT Short Term Goal 4 (Week 1): Pt will complete 3/3 toileting tasks with guarding assist  Skilled Therapeutic Interventions/Progress Updates:    Pt seen for OT ADL bathing/dressing session. Pt received sitting EOB with hand off from PT, pt agreeable to tx session and bathing at shower level. He ambulated throughout session with rollator and supervision. Bathed seated on tub bench with distant supervision, using support of grab bars for steadying assist.  He returned to EOB to dress. He required questioning cues for attention to L during dressing task as x2 during session pt voiced being done dressing and ready to ambulate with only R shoe donned. Required min cuing throughout session for rollator management in functional context.  Completed bed mobility at overall mod I level, reported "swimmy headed" feeling when rolling to L and coming into sitting EOB- completed with TED hose and abdominal binder donned. Pt returned to recliner at end of session, left seated with all needs in reach awaiting lunch.   Therapy Documentation Precautions:  Precautions Precautions: Fall Precaution Comments: left bias; R chronic clavicle fx Required Braces or Orthoses: Sling(Sling for comfort only, pt declines use) Restrictions Weight Bearing Restrictions: No Pain:   No/ denies pain  See Function Navigator for Current Functional Status.   Therapy/Group:  Individual Therapy  Kendrick Haapala L 07/08/2017, 7:14 AM

## 2017-07-08 NOTE — Progress Notes (Signed)
Social Work Patient ID: Frank [name], male   DOB: 08-May-1941, 76 y.o.   MRN: [id number] Spoke with [employer] for Workers Comp she reports Catrina-adjuster will be setting up equipment and home health. Spoke with her and she will begin working on it but unsure if will be done prior to pt's discharge. Have made referral to [facility] for equipment so can get home. Will contact in am regarding agency found to staff home health services.

## 2017-07-08 NOTE — Progress Notes (Signed)
Physical Therapy Note  Patient Details  Name: Silvia Markuson MRN: [id number] Date of Birth: 07/30/1940 Today's Date: 07/08/2017  1453-1535, 42 min individual tx Pain: none per pt  Gait training with rollator walker rooom>< gym, without cues for brakes.  [place] A exs via hand out and multimodal cues for standing 10 x 1 R/L hamstring curls, bil mini squats, R/L leg abduction, R/L modified tandem stance x 10 seconds each and sit>< stand with bil hands x 5. Pt needed mod assist for balance to find modified tandem position with L foot leading in which he was able to balance x 10 seconds.  During 5 x STS from armchair, pt had posterior LOB on 4th trial, requiring mod assist to regain.  Discussed fitness for life and avoiding falls as he ages.  Standing on wedge for balance challenge and sustained stretch x 2 minutes, with bil UE support fading to 0UE support, using hip strategy to balance, due to lack of ankle strategy.. Pt left resting in recliner with all needs within reach.    See function navigator for current status.  Savon Cobbs 07/08/2017, 3:08 PM

## 2017-07-08 NOTE — Plan of Care (Signed)
  Progressing RH SAFETY RH STG ADHERE TO SAFETY PRECAUTIONS W/ASSISTANCE/DEVICE Description STG Adhere to Safety Precautions With Assistance/Device. Maintain safety with cues and reminders.  STG Adhere to Safety Precautions With Assistance/Device. 07/08/2017 0000 - Progressing by Edd Arbour, RN RH BOWEL ELIMINATION RH STG MANAGE BOWEL WITH ASSISTANCE Description STG Manage Bowel with  Mod I Assistance.  07/08/2017 0000 - Progressing by Edd Arbour, RN RH BLADDER ELIMINATION RH STG MANAGE BLADDER WITH ASSISTANCE Description STG Manage Bladder With mod I  Assistance  07/08/2017 0000 - Progressing by Edd Arbour, RN RH SKIN INTEGRITY RH STG SKIN FREE OF INFECTION/BREAKDOWN Description No skin infection on entire stay  07/08/2017 0000 - Progressing by Edd Arbour, RN RH KNOWLEDGE DEFICIT RH STG INCREASE KNOWLEDGE OF HYPERTENSION 07/08/2017 0000 - Progressing by Edd Arbour, RN

## 2017-07-08 NOTE — Progress Notes (Signed)
Occupational Therapy Discharge Summary  Patient Details  Name: Frank [name] MRN: [id number] Date of Birth: 1940/10/13   Patient has met 10 of 10 long term goals due to improved activity tolerance, improved balance, postural control, improved attention, improved awareness and improved coordination.  Patient to discharge at overall Supervision level.  According to Frank [name], pt's wife is aware that we are recommending supervision assist at d/c, however, pt's wife has not been present during rehab admission, and therefore has not been formally made aware of pt's physical and cognitive deficits. His wife reports to CSW that she will willing and able to provide needed assist at d/c.  Recommending pt use rollator for all functional mobility, he voices understanding, however, is reluctant to use AD and would like to return to independence as soon as possible. He has decreased safety awareness and decreased awareness of deficits which increases his risk of falls.   Reasons goals not met: Overall, pt has met mod I goals. However, he does require VCs for safety awareness during dynamic tasks as pt with decreased awareness of deficits and at times poor management of rollator. Although he is at mod I level, would recommend pt have supervision at d/c- family is aware of recommendation.   Recommendation:  Patient will benefit from ongoing skilled OT services in home health setting to continue to advance functional skills in the area of BADL and Reduce care partner burden.  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) Perception  Perception: Impaired Inattention/Neglect: Does not attend to left side of body Praxis Praxis: Intact Cognition Overall Cognitive Status: Impaired/Different from baseline Arousal/Alertness: Awake/alert Orientation Level: Oriented X4 Sustained Attention: Appears intact Memory: Impaired Memory Impairment: Decreased recall of new information;Decreased short term memory Decreased Short Term Memory: Verbal basic;Functional complex Awareness: Impaired Awareness Impairment: Anticipatory impairment Problem Solving: Impaired Problem Solving Impairment: Verbal complex;Functional complex Safety/Judgment: Impaired Comments: Decreased safety awareness and poor awareness of deficits Sensation Sensation Light Touch: Appears Intact Proprioception: Appears Intact Coordination Gross Motor Movements are Fluid and Coordinated: No Fine Motor Movements are Fluid and Coordinated: Yes Coordination and Movement Description: mild L discordination  Finger Nose Finger Test: [facility] Heel Shin Test: [facility] Motor  Motor Motor: Hemiplegia Motor - Discharge Observations: mild L hemiplegia. decreased coordination in the LLE Mobility  Bed Mobility Bed Mobility: Rolling Right;Rolling Left;Supine to Sit;Sit to Supine Rolling Right: 5: Supervision Rolling Left: 5: Supervision Supine to Sit: 5: Supervision Sit to Supine: 5: Supervision Transfers Sit to Stand: 6: Modified independent (Device/Increase time)(with Rollator)  Trunk/Postural Assessment  Cervical Assessment Cervical Assessment: Exceptions to WFL(Forward head) Thoracic Assessment Thoracic Assessment: Exceptions to WFL(Kyphotic) Lumbar Assessment Lumbar Assessment: Within Functional Limits Postural  Control Postural Control: Deficits on evaluation Protective Responses: delayed  Balance Balance Balance Assessed: Yes Dynamic Sitting Balance Dynamic Sitting - Balance Support: During functional activity;Feet supported Dynamic Sitting - Level of Assistance: 6: Modified independent (Device/Increase time) Sitting balance - Comments: Sitting on toilet to dress Static Standing Balance Static Standing - Balance Support: No upper extremity supported;During functional activity  Static Standing - Level of Assistance: 6: Modified independent (Device/Increase time) Dynamic Standing Balance Dynamic Standing - Balance Support: No upper extremity supported;During functional activity Dynamic Standing - Level of Assistance: 5: Stand by assistance;6: Modified independent (Device/Increase time)(Mod I with one UE support, supervision without UE support) Dynamic Standing - Comments: Standing to complete bathing/dressing Extremity/Trunk Assessment RUE Assessment RUE Assessment: Within Functional Limits(Not formally assessed due to chronic R clavicle fx, however, pt able to use WFL) LUE Assessment LUE Assessment: Within Functional Limits   See Function Navigator for Current Functional Status.  Jazarah Capili L 07/08/2017, 3:39 PM

## 2017-07-08 NOTE — Progress Notes (Signed)
Social Work Patient ID: Frank [name], male   DOB: 02-05-1941, 76 y.o.   MRN: [id number]   Finally got a hold of Marlowe Kays Workers Comp CM who wants scripts faxed to her and she will see if preferred provider. She is ill with the flu, she will get back with this worker.

## 2017-07-08 NOTE — Progress Notes (Signed)
[facility] PHYSICAL MEDICINE & REHABILITATION     PROGRESS NOTE    Subjective/Complaints:  Per PT treated for BPPV on left yesterday with some improvement in dizziness Still orthostatic this am ROS: Denies nausea, vomiting, diarrhea, shortness of breath or chest pain   Objective: Vital Signs: Blood pressure (!) 117/50, pulse 66, temperature 97.7 F (36.5 C), temperature source Oral, resp. rate 18, height 5\' 10"  (1.778 m), weight 93.9 kg (207 lb 0.2 oz), SpO2 97 %. No results found. Recent Labs    07/05/17 1057  WBC 8.3  HGB 10.4*  HCT 32.2*  PLT 182   Recent Labs    07/05/17 1057 07/07/17 0540  NA 132* 132*  K 4.4 4.4  CL 98* 99*  GLUCOSE 254* 159*  BUN 35* 29*  CREATININE 1.92* 1.20  CALCIUM 8.9 8.8*   CBG (last 3)  Recent Labs    07/07/17 1709 07/07/17 2115 07/08/17 0653  GLUCAP 117* 241* 146*    Wt Readings from Last 3 Encounters:  07/07/17 93.9 kg (207 lb 0.2 oz)  06/23/17 94.1 kg (207 lb 7.3 oz)  05/12/17 98.5 kg (217 lb 3.2 oz)    Physical Exam:  Constitutional: He appears well-developed. Obese  HENT: Normocephalic and atraumatic.  Eyes: EOM are normal. No discharge.  Cardiovascular: RRR. No JVD    Respiratory: CTA Bilaterally. Normal effort  GI: Bowel sounds are normal. He exhibits no distension.  Musculoskeletal: He exhibits no edema or tenderness.  Neurological: He is alert and oriented Normal insight and awarenes Motor:  RUE/RLE: 5/5 proximal to distal  LUE/LLE: 4+5/5 prox to distal.   Skin: Skin is warm and dry.  Psychiatric: pleasant and appropriate Ext no C/C/E Assessment/Plan: 1. Functional and mobility deficits secondary to right posterior parietal IPH which require 3+ hours per day of interdisciplinary therapy in a comprehensive inpatient rehab setting. Physiatrist is providing close team supervision and 24 hour management of active medical problems listed below. Physiatrist and rehab team continue to assess barriers to  discharge/monitor patient progress toward functional and medical goals.  Function:  Bathing Bathing position Bathing activity did not occur: Refused Position: Production manager parts bathed by patient: Right arm, Left arm, Chest, Abdomen, Front perineal area, Right upper leg, Buttocks, Left upper leg, Right lower leg, Left lower leg, Back Body parts bathed by helper: Back  Bathing assist Assist Level: Supervision or verbal cues      Upper Body Dressing/Undressing Upper body dressing   What is the patient wearing?: Pull over shirt/dress     Pull over shirt/dress - Perfomed by patient: Thread/unthread right sleeve, Thread/unthread left sleeve, Put head through opening, Pull shirt over trunk          Upper body assist Assist Level: Supervision or verbal cues   Set up : To obtain clothing/put away  Lower Body Dressing/Undressing Lower body dressing   What is the patient wearing?: Underwear, Pants, Socks, Shoes Underwear - Performed by patient: Pull underwear up/down, Thread/unthread left underwear leg, Thread/unthread right underwear leg Underwear - Performed by helper: Thread/unthread right underwear leg Pants- Performed by patient: Thread/unthread right pants leg, Thread/unthread left pants leg, Pull pants up/down Pants- Performed by helper: Thread/unthread right pants leg   Non-skid slipper socks- Performed by helper: Don/doff right sock, Don/doff left sock Socks - Performed by patient: Don/doff right sock, Don/doff left sock   Shoes - Performed by patient: Don/doff right shoe, Don/doff left shoe, Fasten right, Fasten left Shoes - Performed by helper: Fasten  left          Lower body assist Assist for lower body dressing: Touching or steadying assistance (Pt > 75%)      Toileting Toileting   Toileting steps completed by patient: Adjust clothing prior to toileting, Performs perineal hygiene, Adjust clothing after toileting Toileting steps completed by helper:  Adjust clothing prior to toileting, Adjust clothing after toileting Toileting Assistive Devices: Grab bar or rail  Toileting assist Assist level: Supervision or verbal cues   Transfers Chair/bed transfer   Chair/bed transfer method: Stand pivot Chair/bed transfer assist level: Supervision or verbal cues Chair/bed transfer assistive device: Medical sales representative     Max distance: 23ft Assist level: Supervision or verbal cues   Wheelchair          Cognition Comprehension Comprehension assist level: Follows complex conversation/direction with extra time/assistive device  Expression Expression assist level: Expresses complex 90% of the time/cues < 10% of the time  Social Interaction Social Interaction assist level: Interacts appropriately with others with medication or extra time (anti-anxiety, antidepressant).  Problem Solving Problem solving assist level: Solves complex problems: With extra time  Memory Memory assist level: Recognizes or recalls 90% of the time/requires cueing < 10% of the time   Medical Problem List and Plan: 1.  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  DVT Prophylaxis/Anticoagulation: SCDs. Monitor for any signs of DVT.    Vascular study neg for DVT 3. Pain Management: Tylenol as needed 4. Mood: Prozac 20 mg daily. Provide emotional support 5. Neuropsych: This patient is capable of making decisions on his own behalf. 6. Skin/Wound Care: Routine skin checks 7. Fluids/Electrolytes/Nutrition:    Continue to push po.  8. Hypertension. Norvasc 10 mg daily, Cardura 2 mg daily at bedtime, Avapro 150 mg twice a day. Monitor with increased mobility   Orthostatic hypotension, symptoms improved after IVF and d/c hydralazine, still drops to <90 when standing with abd binder and TEDs will d/c  cardura    Vitals:   07/07/17 1450 07/08/17 0215  BP: (!) 110/46 (!) 117/50  Pulse: 80 66  Resp: 17 18  Temp: 98.2 F (36.8 C) 97.7 F (36.5 C)  SpO2: 95% 97%   9. Diabetes mellitus peripheral neuropathy. Hemoglobin A1c 7.2. SSI. Check blood sugars before meals and at bedtime. Patient on Glucotrol XL 10 mg daily, Januvia 100 mg daily, Glucophage 1000 mg twice a day prior to admission.   Resumed Glucotrol XL on 12/19     Tradjenta 2.5 started on 12/22, increased to 5mg  12/27 Will not restart metformin unless Creat normalizes CBG (last 3)  Recent Labs    07/07/17 1709 07/07/17 2115 07/08/17 0653  GLUCAP 117* 241* 146*    Improving but not at goal, no change in tx 12/25 10. BPH. The triptan 5 mg twice a day.  Emptying bladder 11. Hyperlipidemia. Lipitor 12. History of chronic right rotator cuff tear as well as right distal clavicle fracture. Conservative care. Weightbearing as tolerated. Shoulder sling for comfort as needed   -Does not appear to be a major issue at present 13. Hyponatremia   Na 132 on 12/21 stable 12/26 despite hypotonic saline     No need to monitor serially 14. ABLA   Hb 11.4 on 12/18    15. Acute on CKD   Cr 1.25  on 12/21, up to 1.92 12/24, Cr 1.2 on 12/26 post IV hydration D/C IVF at noc,enc po fluid LOS (Days) [place] EVALUATION WAS PERFORMED  Charlett Blake, MD 07/08/2017 9:14 AM

## 2017-07-09 LAB — GLUCOSE, CAPILLARY: Glucose-Capillary: 178 mg/dL — ABNORMAL HIGH (ref 65–99)

## 2017-07-09 MED ORDER — FLUOXETINE HCL 20 MG PO CAPS: 20.0000 mg | ORAL_CAPSULE | Freq: Every day | ORAL | 1 refills | Status: DC

## 2017-07-09 MED ORDER — AMLODIPINE BESYLATE 10 MG PO TABS
10.0000 mg | ORAL_TABLET | Freq: Every day | ORAL | 0 refills | Status: DC
Start: 2017-07-09 — End: 2017-07-20

## 2017-07-09 MED ORDER — LINAGLIPTIN 5 MG PO TABS: 5.0000 mg | ORAL_TABLET | Freq: Every day | ORAL | 0 refills | Status: DC

## 2017-07-09 MED ORDER — PANTOPRAZOLE SODIUM 40 MG PO TBEC: 40.0000 mg | DELAYED_RELEASE_TABLET | Freq: Every day | ORAL | 0 refills | Status: DC

## 2017-07-09 MED ORDER — GLIPIZIDE ER 10 MG PO TB24: 10.0000 mg | ORAL_TABLET | Freq: Every day | ORAL | 1 refills | Status: DC

## 2017-07-09 MED ORDER — OXYBUTYNIN CHLORIDE 5 MG PO TABS: 5.0000 mg | ORAL_TABLET | Freq: Two times a day (BID) | ORAL | 0 refills | Status: DC

## 2017-07-09 MED ORDER — TELMISARTAN 40 MG PO TABS: 40.0000 mg | ORAL_TABLET | Freq: Every day | ORAL | 1 refills | Status: DC

## 2017-07-09 MED ORDER — ATORVASTATIN CALCIUM 20 MG PO TABS: 10.0000 mg | ORAL_TABLET | Freq: Every day | ORAL | 3 refills | Status: DC

## 2017-07-09 NOTE — Progress Notes (Signed)
[facility] PHYSICAL MEDICINE & REHABILITATION     PROGRESS NOTE    Subjective/Complaints:  Slept well, pt without breathing issues , discussed orthostatic hypotension  ROS: Denies nausea, vomiting, diarrhea, shortness of breath or chest pain   Objective: Vital Signs: Blood pressure (!) 161/73, pulse 71, temperature 98.2 F (36.8 C), temperature source Oral, resp. rate 18, height 5\' 10"  (1.778 m), weight 93.9 kg (207 lb 0.2 oz), SpO2 100 %. No results found. No results for input(s): WBC, HGB, HCT, PLT in the last 72 hours. Recent Labs    07/07/17 0540  NA 132*  K 4.4  CL 99*  GLUCOSE 159*  BUN 29*  CREATININE 1.20  CALCIUM 8.8*   CBG (last 3)  Recent Labs    07/08/17 1628 07/08/17 2200 07/09/17 0651  GLUCAP 145* 250* 178*    Wt Readings from Last 3 Encounters:  07/07/17 93.9 kg (207 lb 0.2 oz)  06/23/17 94.1 kg (207 lb 7.3 oz)  05/12/17 98.5 kg (217 lb 3.2 oz)    Physical Exam:  Constitutional: He appears well-developed. Obese  HENT: Normocephalic and atraumatic.  Eyes: EOM are normal. No discharge.  Cardiovascular: RRR. No JVD    Respiratory: CTA Bilaterally. Normal effort  GI: Bowel sounds are normal. He exhibits no distension.  Musculoskeletal: He exhibits no edema or tenderness.  Neurological: He is alert and oriented Normal insight and awarenes Motor:  RUE/RLE: 5/5 proximal to distal  LUE/LLE: 4+5/5 prox to distal.   Skin: Skin is warm and dry.  Psychiatric: pleasant and appropriate Ext no C/C/E Assessment/Plan: 1. Functional and mobility deficits secondary to right posterior parietal IPH  Stable for D/C today F/u PCP in 3-4 weeks F/u PM&R 2 weeks See D/C summary See D/C instructions Function:  Bathing Bathing position Bathing activity did not occur: Refused Position: Shower  Bathing parts Body parts bathed by patient: Right arm, Left arm, Chest, Abdomen, Front perineal area, Right upper leg, Buttocks, Left upper leg, Right lower leg, Left  lower leg, Back Body parts bathed by helper: Back  Bathing assist Assist Level: Supervision or verbal cues      Upper Body Dressing/Undressing Upper body dressing   What is the patient wearing?: Pull over shirt/dress     Pull over shirt/dress - Perfomed by patient: Thread/unthread right sleeve, Thread/unthread left sleeve, Put head through opening, Pull shirt over trunk          Upper body assist Assist Level: More than reasonable time   Set up : To obtain clothing/put away  Lower Body Dressing/Undressing Lower body dressing   What is the patient wearing?: Pants, Shoes Underwear - Performed by patient: Pull underwear up/down, Thread/unthread left underwear leg, Thread/unthread right underwear leg Underwear - Performed by helper: Thread/unthread right underwear leg Pants- Performed by patient: Thread/unthread right pants leg, Thread/unthread left pants leg, Pull pants up/down Pants- Performed by helper: Thread/unthread right pants leg   Non-skid slipper socks- Performed by helper: Don/doff right sock, Don/doff left sock Socks - Performed by patient: Don/doff right sock, Don/doff left sock   Shoes - Performed by patient: Don/doff right shoe, Don/doff left shoe, Fasten right, Fasten left Shoes - Performed by helper: Fasten left          Lower body assist Assist for lower body dressing: Supervision or verbal cues      Toileting Toileting   Toileting steps completed by patient: Adjust clothing prior to toileting, Performs perineal hygiene, Adjust clothing after toileting Toileting steps completed by helper: Adjust  clothing prior to toileting, Adjust clothing after toileting [place]: Grab bar or rail, Other (comment)(rollator)  Toileting assist Assist level: Supervision or verbal cues   Transfers Chair/bed transfer   Chair/bed transfer method: Ambulatory Chair/bed transfer assist level: No Help, no cues, assistive device, takes more than a reasonable  amount of time Chair/bed transfer assistive device: Walker, Air cabin crew     Max distance: 250  Assist level: No help, No cues, assistive device, takes more than a reasonable amount of time   Wheelchair Wheelchair activity did not occur: N/A        Cognition Comprehension Comprehension assist level: Follows complex conversation/direction with extra time/assistive device  Expression Expression assist level: Expresses complex 90% of the time/cues < 10% of the time  Social Interaction Social Interaction assist level: Interacts appropriately with others with medication or extra time (anti-anxiety, antidepressant).  Problem Solving Problem solving assist level: Solves basic problems with no assist  Memory Memory assist level: Recognizes or recalls 75 - 89% of the time/requires cueing 10 - 24% of the time   Medical Problem List and Plan: 1.  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  DVT Prophylaxis/Anticoagulation: SCDs. Monitor for any signs of DVT.    Vascular study neg for DVT 3. Pain Management: Tylenol as needed 4. Mood: Prozac 20 mg daily. Provide emotional support 5. Neuropsych: This patient is capable of making decisions on his own behalf. 6. Skin/Wound Care: Routine skin checks 7. Fluids/Electrolytes/Nutrition:    Continue to push po.  8. Hypertension. Norvasc 10 mg daily, Cardura 2 mg daily at bedtime, Avapro 150 mg twice a day. Monitor with increased mobility   Orthostatic hypotension, symptoms improved after IVF and d/c hydralazine, still drops to <90 when standing with abd binder and TEDs will d/c cardura f/u with PCP, mainly has am systolic elevation when laying only    Vitals:   07/08/17 2100 07/09/17 0407  BP: 124/60 (!) 161/73  Pulse: 77 71  Resp: 18 18  Temp: 98.6 F (37 C) 98.2 F (36.8 C)   SpO2: 99% 100%   9. Diabetes mellitus peripheral neuropathy. Hemoglobin A1c 7.2. SSI. Check blood sugars before meals and at bedtime. Patient on Glucotrol XL 10 mg daily, Januvia 100 mg daily, Glucophage 1000 mg twice a day prior to admission.   Resumed Glucotrol XL on 12/19     Tradjenta 2.5 started on 12/22, increased to 5mg  12/27 May need restart metformin at home depending on CBG on Home diet, f/u with PCP CBG (last 3)  Recent Labs    07/08/17 1628 07/08/17 2200 07/09/17 0651  GLUCAP 145* 250* 178*     10. BPH. The triptan 5 mg twice a day.  Emptying bladder 11. Hyperlipidemia. Lipitor 12. History of chronic right rotator cuff tear as well as right distal clavicle fracture. Conservative care. Weightbearing as tolerated. Shoulder sling for comfort as needed   -Does not appear to be a major issue at present 13. Hyponatremia   Na 132 on 12/21 stable 12/26 despite hypotonic saline     No need to monitor serially 14. ABLA   Hb 11.4 on 12/18    15. Acute on CKD   Cr 1.25 on 12/21, up to 1.92 12/24, Cr 1.2 on 12/26 post IV hydration D/C IVF at noc,enc po fluid LOS (Days)  [place] EVALUATION WAS PERFORMED  Charlett Blake, MD 07/09/2017 8:43 AM

## 2017-07-09 NOTE — Progress Notes (Signed)
Social Work  Discharge Note  The overall goal for the admission was met for:   Discharge location: Yes-HOME WITH WIFE WHO CAN PROVIDE 24 HR SUPERVISION  Length of Stay: Yes-11 DAYS  Discharge activity level: Yes-SUPERVISION LEVEL  Home/community participation: Yes  Services provided included: MD, RD, PT, OT, RN, CM, Pharmacy and SW  Financial Services: Worker's Arboriculturist: [employer]  Follow-up services arranged: Home Health: [facility], DME: [facility] and Patient/Family has no preference for HH/DME agencies  Comments (or additional information):WIFE AWARE NED FOR 24 HR SUPERVISION AND WILL BE PROVIDING THIS. MADE AWARE OF SAFETY CONCERNS OF TEAM  Patient/Family verbalized understanding of follow-up arrangements: Yes  Individual responsible for coordination of the follow-up plan: INA-WIFE  Confirmed correct DME delivered: [name] 07/09/2017    [name]

## 2017-07-09 NOTE — Discharge Instructions (Signed)
Inpatient Rehab Discharge Instructions  Avantae Bither Discharge date and time: No discharge date for patient encounter.   Activities/Precautions/ Functional Status: Activity: activity as tolerated Diet: diabetic diet Wound Care: none needed Functional status:  ___ No restrictions     ___ Walk up steps independently ___ 24/7 supervision/assistance   ___ Walk up steps with assistance ___ Intermittent supervision/assistance  ___ Bathe/dress independently ___ Walk with walker     _x__ Bathe/dress with assistance ___ Walk Independently    ___ Shower independently ___ Walk with assistance    ___ Shower with assistance ___ No alcohol     ___ Return to work/school ________  Special Instructions: No driving  Follow-up with neurology services Dr.Xu in regards to resuming aspirin therapy   COMMUNITY REFERRALS UPON DISCHARGE:    Home Health:   PT &  OT TO BE SET UP BY WORKERS COMP CASE MANAGER-CONNIE BAKER [phone]  Medical Equipment/Items [place]   [phone]    My questions have been answered and I understand these instructions. I will adhere to these goals and the provided educational materials after my discharge from the hospital.  Patient/Caregiver Signature _______________________________ Date __________  Clinician Signature _______________________________________ Date __________  Please bring this form and your medication list with you to all your follow-up doctor's appointments.

## 2017-07-09 NOTE — Progress Notes (Signed)
Pt in room receiving discharge instructions with wife, packing items, excited and ready to leave

## 2017-07-12 ENCOUNTER — Inpatient Hospital Stay: Payer: Self-pay | Admitting: Internal Medicine

## 2017-07-14 ENCOUNTER — Encounter: Payer: Self-pay | Admitting: Internal Medicine

## 2017-07-15 ENCOUNTER — Telehealth: Payer: Self-pay | Admitting: Physical Medicine & Rehabilitation

## 2017-07-15 NOTE — Telephone Encounter (Signed)
Frank [name] with Home Health needs clarification on Home Health orders for this patient.  Patient was discharged from hospital.  Please call him at [phone].

## 2017-07-16 ENCOUNTER — Encounter: Payer: Self-pay | Admitting: Internal Medicine

## 2017-07-16 NOTE — Telephone Encounter (Signed)
LVM encouraging home health to call us back

## 2017-07-20 ENCOUNTER — Other Ambulatory Visit (INDEPENDENT_AMBULATORY_CARE_PROVIDER_SITE_OTHER): Payer: Medicare Other

## 2017-07-20 ENCOUNTER — Encounter: Payer: Self-pay | Admitting: Internal Medicine

## 2017-07-20 ENCOUNTER — Ambulatory Visit (INDEPENDENT_AMBULATORY_CARE_PROVIDER_SITE_OTHER): Payer: Medicare Other | Admitting: Internal Medicine

## 2017-07-20 VITALS — BP 130/58 | HR 74 | Temp 98.6°F | Resp 16 | Ht 70.0 in | Wt 207.0 lb

## 2017-07-20 DIAGNOSIS — D62 Acute posthemorrhagic anemia: Secondary | ICD-10-CM

## 2017-07-20 DIAGNOSIS — E871 Hypo-osmolality and hyponatremia: Secondary | ICD-10-CM

## 2017-07-20 DIAGNOSIS — D510 Vitamin B12 deficiency anemia due to intrinsic factor deficiency: Secondary | ICD-10-CM | POA: Diagnosis not present

## 2017-07-20 DIAGNOSIS — K219 Gastro-esophageal reflux disease without esophagitis: Secondary | ICD-10-CM

## 2017-07-20 DIAGNOSIS — E1142 Type 2 diabetes mellitus with diabetic polyneuropathy: Secondary | ICD-10-CM | POA: Diagnosis not present

## 2017-07-20 DIAGNOSIS — I10 Essential (primary) hypertension: Secondary | ICD-10-CM | POA: Diagnosis not present

## 2017-07-20 DIAGNOSIS — N183 Chronic kidney disease, stage 3 unspecified: Secondary | ICD-10-CM

## 2017-07-20 DIAGNOSIS — Z23 Encounter for immunization: Secondary | ICD-10-CM | POA: Insufficient documentation

## 2017-07-20 LAB — CBC WITH DIFFERENTIAL/PLATELET
Basophils Absolute: 0 10*3/uL (ref 0.0–0.1)
Basophils Relative: 0.7 % (ref 0.0–3.0)
Eosinophils Absolute: 0.4 10*3/uL (ref 0.0–0.7)
Eosinophils Relative: 6.1 % — ABNORMAL HIGH (ref 0.0–5.0)
HCT: 34.2 % — ABNORMAL LOW (ref 39.0–52.0)
Hemoglobin: 11 g/dL — ABNORMAL LOW (ref 13.0–17.0)
Lymphocytes Relative: 15.8 % (ref 12.0–46.0)
Lymphs Abs: 1.2 10*3/uL (ref 0.7–4.0)
MCHC: 32.2 g/dL (ref 30.0–36.0)
MCV: 84 fl (ref 78.0–100.0)
Monocytes Absolute: 1.1 10*3/uL — ABNORMAL HIGH (ref 0.1–1.0)
Monocytes Relative: 14.9 % — ABNORMAL HIGH (ref 3.0–12.0)
Neutro Abs: 4.6 10*3/uL (ref 1.4–7.7)
Neutrophils Relative %: 62.5 % (ref 43.0–77.0)
Platelets: 186 10*3/uL (ref 150.0–400.0)
RBC: 4.07 Mil/uL — ABNORMAL LOW (ref 4.22–5.81)
RDW: 13.3 % (ref 11.5–15.5)
WBC: 7.3 10*3/uL (ref 4.0–10.5)

## 2017-07-20 LAB — BASIC METABOLIC PANEL
BUN: 22 mg/dL (ref 6–23)
CO2: 30 mEq/L (ref 19–32)
Calcium: 9.1 mg/dL (ref 8.4–10.5)
Chloride: 98 mEq/L (ref 96–112)
Creatinine, Ser: 1.19 mg/dL (ref 0.40–1.50)
GFR: 62.71 mL/min (ref >60.00)
Glucose, Bld: 149 mg/dL — ABNORMAL HIGH (ref 70–99)
Potassium: 4.6 mEq/L (ref 3.5–5.1)
Sodium: 135 mEq/L (ref 135–145)

## 2017-07-20 LAB — POCT GLYCOSYLATED HEMOGLOBIN (HGB A1C): Hemoglobin A1C: 7.2

## 2017-07-20 MED ORDER — ZOSTER VAC RECOMB ADJUVANTED 50 MCG/0.5ML IM SUSR: 0.5000 mL | Freq: Once | INTRAMUSCULAR | 1 refills | Status: AC

## 2017-07-20 NOTE — Patient Instructions (Signed)

## 2017-07-20 NOTE — Progress Notes (Signed)
Subjective:  Patient ID: Frank [name], male    DOB: Jan 10, 1941  Age: 77 y.o. MRN: [id number]  CC: Hypertension and Diabetes   HPI Frank [name] presents for f/up - he was recently admitted and had a rehab stay after he had a traumatic brain bleed.  His only remaining symptom is mild but improving left upper and lower extremity weakness.  He iha also had some fatigue but is making progress.  He also suffered an acute kidney injury during this and was found to be mildly anemic.  Outpatient Medications Prior to Visit  Medication Sig Dispense Refill  . atorvastatin (LIPITOR) 20 MG tablet Take 0.5 tablets (10 mg total) by mouth daily. 90 tablet 3  . FLUoxetine (PROZAC) 20 MG capsule Take 1 capsule (20 mg total) by mouth daily. 90 capsule 1  . glipiZIDE (GLUCOTROL XL) 10 MG 24 hr tablet Take 1 tablet (10 mg total) by mouth daily. 90 tablet 1  . oxybutynin (DITROPAN) 5 MG tablet Take 1 tablet (5 mg total) by mouth 2 (two) times daily. 60 tablet 0  . telmisartan (MICARDIS) 40 MG tablet Take 1 tablet (40 mg total) by mouth daily. 90 tablet 1  . linagliptin (TRADJENTA) 5 MG TABS tablet Take 1 tablet (5 mg total) by mouth daily. 30 tablet 0  . pantoprazole (PROTONIX) 40 MG tablet Take 1 tablet (40 mg total) by mouth daily. 30 tablet 0  . cetirizine (ZYRTEC) 10 MG tablet Take 10 mg by mouth daily.    [name] amLODipine (NORVASC) 10 MG tablet Take 1 tablet (10 mg total) by mouth daily. (Patient not taking: Reported on 07/20/2017) 30 tablet 0   No facility-administered medications prior to visit.     ROS Review of Systems  Constitutional: Positive for activity change and fatigue. Negative for appetite change and unexpected weight change.  HENT: Negative.  Negative for trouble swallowing.   Eyes: Negative for visual disturbance.  Respiratory: Negative for cough, chest tightness, shortness of breath and wheezing.   Cardiovascular: Negative for chest pain, palpitations and leg swelling.    Gastrointestinal: Negative for abdominal pain, constipation, diarrhea, nausea and vomiting.  Endocrine: Negative.   Genitourinary: Negative.   Musculoskeletal: Positive for gait problem. Negative for back pain and myalgias.  Skin: Negative for color change and rash.  Allergic/Immunologic: Negative.   Neurological: Positive for weakness. Negative for dizziness, seizures, facial asymmetry, light-headedness, numbness and headaches.  Hematological: Negative for adenopathy. Does not bruise/bleed easily.  Psychiatric/Behavioral: Negative.  Negative for dysphoric mood and suicidal ideas. The patient is not nervous/anxious.     Objective:  BP (!) 130/58 (BP Location: Left Arm, Patient Position: Sitting, Cuff Size: Normal)   Pulse 74   Temp 98.6 F (37 C) (Oral)   Resp 16   Ht 5\' 10"  (1.778 m)   Wt 207 lb (93.9 kg)   SpO2 93%   BMI 29.70 kg/m   BP Readings from Last 3 Encounters:  07/20/17 (!) 130/58  07/09/17 (!) 161/73  06/28/17 124/60    Wt Readings from Last 3 Encounters:  07/20/17 207 lb (93.9 kg)  07/07/17 207 lb 0.2 oz (93.9 kg)  06/23/17 207 lb 7.3 oz (94.1 kg)    Physical Exam  Constitutional: He is oriented to person, place, and time. No distress.  HENT:  Mouth/Throat: Oropharynx is clear and moist. No oropharyngeal exudate.  Eyes: Conjunctivae are normal. Left eye exhibits no discharge. No scleral icterus.  Neck: Normal range of motion. Neck supple. No JVD  present. No thyromegaly present.  Cardiovascular: Normal rate, regular rhythm and normal heart sounds.  No murmur heard. Pulmonary/Chest: Effort normal and breath sounds normal. No respiratory distress. He has no wheezes. He has no rales.  Abdominal: Soft. Bowel sounds are normal. He exhibits no mass. There is no tenderness.  Musculoskeletal: Normal range of motion. He exhibits no edema, tenderness or deformity.  Neurological: He is alert and oriented to person, place, and time. He displays no atrophy, no tremor  and normal reflexes. No sensory deficit. He exhibits normal muscle tone. He displays a negative Romberg sign. He displays no seizure activity. Coordination and gait normal. He displays no Babinski's sign on the right side. He displays no Babinski's sign on the left side.  Skin: Skin is warm and dry. No rash noted. He is not diaphoretic. No erythema. No pallor.  Vitals reviewed.   Lab Results  Component Value Date   WBC 7.3 07/20/2017   HGB 11.0 (L) 07/20/2017   HCT 34.2 (L) 07/20/2017   PLT 186.0 07/20/2017   GLUCOSE 149 (H) 07/20/2017   CHOL 102 06/26/2017   TRIG 88 06/26/2017   HDL 42 06/26/2017   LDLCALC 42 06/26/2017   ALT 27 06/29/2017   AST 21 06/29/2017   NA 135 07/20/2017   K 4.6 07/20/2017   CL 98 07/20/2017   CREATININE 1.19 07/20/2017   BUN 22 07/20/2017   CO2 30 07/20/2017   TSH 0.89 06/16/2016   PSA 1.08 07/10/2015   INR 0.94 06/23/2017   HGBA1C 7.2 07/20/2017   MICROALBUR 26.0 (H) 02/01/2017    No results found.  Assessment & Plan:   Frank [name] was seen today for hypertension and diabetes.  Diagnoses and all orders for this visit:  Essential hypertension- His blood pressure is well controlled.  Electrolytes and renal function are normal. -     Basic metabolic panel; Future  Stage 3 chronic kidney disease ([place])- His renal function is improving.  He will avoid nephrotoxic agents.  Will also seek to maintain systolic blood pressure less than 130/80. -     Basic metabolic panel; Future  Acute blood loss anemia- his H&H are improving.  I will continue to monitor this. -     CBC with Differential/Platelet; Future  Vitamin B12 deficiency anemia due to intrinsic factor deficiency- Will continue B12 replacement therapy.  Morbid obesity ([place])- He is working on his lifestyle modifications to lose weight.  Hyponatremia- His sodium level is normal now. -     Basic metabolic panel; Future  Need for shingles vaccine -     Zoster Vaccine Adjuvanted [facility])  injection; Inject 0.5 mLs into the muscle once for 1 dose.  Type 2 diabetes mellitus with peripheral neuropathy ([place])- His A1c is at 7.2%.  His blood sugars are adequately well controlled. -     POCT glycosylated hemoglobin (Hb A1C) -     linagliptin (TRADJENTA) 5 MG TABS tablet; Take 1 tablet (5 mg total) by mouth daily.  GERD without esophagitis -     pantoprazole (PROTONIX) 40 MG tablet; Take 1 tablet (40 mg total) by mouth daily.   I have discontinued [name] Blackwelder's amLODipine. I am also having him start on Zoster Vaccine Adjuvanted. Additionally, I am having him maintain his cetirizine, atorvastatin, FLUoxetine, glipiZIDE, oxybutynin, telmisartan, linagliptin, and pantoprazole.  Meds ordered this encounter  Medications  . Zoster Vaccine Adjuvanted [facility]) injection    Sig: Inject 0.5 mLs into the muscle once for 1 dose.  Dispense:  0.5 mL    Refill:  1  . linagliptin (TRADJENTA) 5 MG TABS tablet    Sig: Take 1 tablet (5 mg total) by mouth daily.    Dispense:  90 tablet    Refill:  1  . pantoprazole (PROTONIX) 40 MG tablet    Sig: Take 1 tablet (40 mg total) by mouth daily.    Dispense:  90 tablet    Refill:  1     Follow-up: Return in about 4 months (around 11/17/2017).  Scarlette Calico, MD

## 2017-07-21 ENCOUNTER — Encounter: Payer: Self-pay | Admitting: Internal Medicine

## 2017-07-21 DIAGNOSIS — G4733 Obstructive sleep apnea (adult) (pediatric): Secondary | ICD-10-CM | POA: Diagnosis not present

## 2017-07-22 DIAGNOSIS — K219 Gastro-esophageal reflux disease without esophagitis: Secondary | ICD-10-CM | POA: Insufficient documentation

## 2017-07-22 MED ORDER — LINAGLIPTIN 5 MG PO TABS: 5.0000 mg | ORAL_TABLET | Freq: Every day | ORAL | 1 refills | Status: DC

## 2017-07-22 MED ORDER — PANTOPRAZOLE SODIUM 40 MG PO TBEC: 40.0000 mg | DELAYED_RELEASE_TABLET | Freq: Every day | ORAL | 1 refills | Status: DC

## 2017-07-25 DIAGNOSIS — G4733 Obstructive sleep apnea (adult) (pediatric): Secondary | ICD-10-CM | POA: Diagnosis not present

## 2017-07-27 ENCOUNTER — Encounter: Payer: Worker's Compensation | Attending: Physical Medicine & Rehabilitation

## 2017-07-27 ENCOUNTER — Encounter: Payer: Self-pay | Admitting: Physical Medicine & Rehabilitation

## 2017-07-27 ENCOUNTER — Ambulatory Visit (HOSPITAL_BASED_OUTPATIENT_CLINIC_OR_DEPARTMENT_OTHER): Payer: Worker's Compensation | Admitting: Physical Medicine & Rehabilitation

## 2017-07-27 VITALS — BP 130/80 | HR 78

## 2017-07-27 DIAGNOSIS — S062X0S Diffuse traumatic brain injury without loss of consciousness, sequela: Secondary | ICD-10-CM

## 2017-07-27 DIAGNOSIS — I10 Essential (primary) hypertension: Secondary | ICD-10-CM | POA: Insufficient documentation

## 2017-07-27 DIAGNOSIS — R269 Unspecified abnormalities of gait and mobility: Secondary | ICD-10-CM | POA: Diagnosis present

## 2017-07-27 DIAGNOSIS — G8194 Hemiplegia, unspecified affecting left nondominant side: Secondary | ICD-10-CM | POA: Diagnosis not present

## 2017-07-27 DIAGNOSIS — E119 Type 2 diabetes mellitus without complications: Secondary | ICD-10-CM | POA: Diagnosis not present

## 2017-07-27 DIAGNOSIS — I69114 Frontal lobe and executive function deficit following nontraumatic intracerebral hemorrhage: Secondary | ICD-10-CM | POA: Diagnosis not present

## 2017-07-27 DIAGNOSIS — I69154 Hemiplegia and hemiparesis following nontraumatic intracerebral hemorrhage affecting left non-dominant side: Secondary | ICD-10-CM | POA: Diagnosis present

## 2017-07-27 DIAGNOSIS — Z9181 History of falling: Secondary | ICD-10-CM | POA: Diagnosis not present

## 2017-07-27 DIAGNOSIS — R296 Repeated falls: Secondary | ICD-10-CM | POA: Diagnosis not present

## 2017-07-27 NOTE — Patient Instructions (Signed)
Would rec [facility]

## 2017-07-27 NOTE — Progress Notes (Signed)
Subjective:    Patient ID: Frank [name], male    DOB: 07-20-1940, 77 y.o.   MRN: [id number] Workers comp case HPI 77 year old right-handed male with history of diabetes mellitus, hypertension, and right rotator cuff tear followed by Dr. Noemi Chapel.  He presented on June 23, 2017 with multiple falls over the past year.  Latest was when he slipped on the ice after becoming dizzy.  He lives with spouse independent prior to admission.  Cranial CT scan showed a right parietal hemorrhage, area of hemorrhage in the posterior right parietal lobe measuring 3.2 x 1.8 cm. No significant mass effect.  Cervical spine films were negative. Followup MRI identifies a 3.7 right parietal hemorrhage with unchanged mild surrounding edema and no mass effect.  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  Echocardiogram with ejection fraction of 60% and grade 1 diastolic dysfunction.  Carotid Dopplers with no ICA stenosis.  X-rays of sacrum showed question of upper coccygeal nondisplaced fracture, advised conservative care per Orthopedic Services.  Following up in office since discharge from my inpt rehab service at [facility]  No current pain complaints  Left leg spasm x 1was severe lasted a couple minutes, no loss of consciousness, no recurrence, no other associated movements  Seen by HHPT ,OT  Had 4 falls since once slid off corner of bed, standing donning shoes  Dressing and bathing mod I Pt would like to drive.  We discussed my recommendation not to do so until cleared by MD.   Pain Inventory Average Pain 0 Pain Right Now 1 My pain is intermittent  In the last 24 hours, has pain interfered with the following? General activity 0 Relation with others 0 Enjoyment of life 0 What TIME of day is your pain at its worst? na Sleep (in general) Good  Pain is worse with: na Pain improves with: na Relief from Meds: 0  Mobility use a walker ability to climb steps?  yes do you  drive?  no  Function employed # of hrs/week 40  Neuro/Psych weakness tremor spasms  Prior Studies Any changes since last visit?  no  Physicians involved in your care Any changes since last visit?  no   Family History  Problem Relation Age of Onset  . Heart disease Mother   . Breast cancer Mother   . Stroke Father   . Allergies Father        Father and children  . Coronary artery disease Brother   . Diabetes Neg Hx   . Colon cancer Neg Hx    Social History   Socioeconomic History  . Marital status: Married    Spouse name: Not on file  . Number of children: Not on file  . Years of education: Not on file  . Highest education level: Not on file  Social Needs  . Financial resource strain: Not on file  . Food insecurity - worry: Not on file  . Food insecurity - inability: Not on file  . Transportation needs - medical: Not on file  . Transportation needs - non-medical: Not on file  Occupational History  . Not on file  Tobacco Use  . Smoking status: Former Smoker    Packs/day: 1.00    Last attempt to quit: 07/14/1983    Years since quitting: 34.0  . Smokeless tobacco: Never Used  Substance and Sexual Activity  . Alcohol use: Yes    Alcohol/week: 1.0 oz    Types: 2 Standard drinks or equivalent per week  Comment: occassionally  . Drug use: No  . Sexual activity: No  Other Topics Concern  . Not on file  Social History Narrative  . Not on file   Past Surgical History:  Procedure Laterality Date  . APPENDECTOMY    . arm fracture Left 2011   with hardware  . COLONOSCOPY    . ESOPHAGOGASTRODUODENOSCOPY  2006   gastritis  . ROTATOR CUFF REPAIR    . SEPTOPLASTY  1959   Deviated Septum  . 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 Allergy    Rhinitis  . Anemia    NOS iron deficient and B12 deficient  . Arthritis   . Diabetes mellitus    Type 2  . GERD (gastroesophageal reflux disease)   . Hyperlipidemia   .  Hypertension   . 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 OSA (obstructive sleep apnea)    cpap   There were no vitals taken for this visit.  Opioid Risk Score:   Fall Risk Score:  `1  Depression screen PHQ 2/9  Depression screen [facility] 2/9 08/31/2016 07/10/2015 07/10/2015 04/06/2014 11/01/2013  Decreased Interest 0 0 0 0 0  Down, Depressed, Hopeless 0 0 0 0 0  PHQ - 2 Score 0 0 0 0 0     Review of Systems  Constitutional: Negative.   HENT: Negative.   Eyes: Negative.   Respiratory: Positive for apnea.   Cardiovascular: Negative.   Gastrointestinal: Negative.   Endocrine: Negative.   Genitourinary: Negative.   Musculoskeletal: Negative.   Skin: Negative.   Allergic/Immunologic: Negative.   Neurological: Negative.   Hematological: Bruises/bleeds easily.  Psychiatric/Behavioral: Negative.   All other systems reviewed and are negative.      Objective:   Physical Exam  Constitutional: He is oriented to person, place, and time. He appears well-developed and well-nourished. No distress.  HENT:  Head: Normocephalic and atraumatic.  Eyes: Conjunctivae and EOM are normal. Pupils are equal, round, and reactive to light.  Cardiovascular: Normal rate, regular rhythm and normal heart sounds. Exam reveals no friction rub.  No murmur heard. Pulmonary/Chest: Effort normal and breath sounds normal. No respiratory distress. He has no wheezes.  Abdominal: Soft. Bowel sounds are normal. He exhibits no distension. There is no tenderness.  Neurological: He is alert and oriented to person, place, and time.  Skin: He is not diaphoretic.  Nursing note and vitals reviewed.         Assessment & Plan:  #1.  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  Patient was to follow-up with neurology however [place] neurolog[facility] per patient does not accept [employer]. We will order repeat CT scan and if this is showing resolution of bleeding  can resume aspirin. Would recommend follow-up with another neurologist that accepts workers [employer]  Cont HH PT, OT,x 4 wks then outpt therapy at [facility]  Charlett Blake M.D. [facility] FAAPM&R (Sports Med, Neuromuscular Med) Diplomate Am Board of Electrodiagnostic Med

## 2017-07-29 ENCOUNTER — Encounter: Payer: Self-pay | Admitting: Internal Medicine

## 2017-08-02 ENCOUNTER — Encounter: Payer: Self-pay | Admitting: Internal Medicine

## 2017-08-04 ENCOUNTER — Emergency Department (HOSPITAL_COMMUNITY): Payer: Worker's Compensation

## 2017-08-04 ENCOUNTER — Emergency Department (HOSPITAL_COMMUNITY): Payer: Worker's Compensation | Admitting: Anesthesiology

## 2017-08-04 ENCOUNTER — Encounter (HOSPITAL_COMMUNITY)
Admission: EM | Disposition: A | Discharge status: Home Health | Payer: Self-pay | Source: Home / Self Care | Attending: Neurosurgery

## 2017-08-04 ENCOUNTER — Ambulatory Visit: Payer: Self-pay | Admitting: *Deleted

## 2017-08-04 ENCOUNTER — Encounter (HOSPITAL_COMMUNITY): Payer: Self-pay | Admitting: *Deleted

## 2017-08-04 ENCOUNTER — Other Ambulatory Visit: Payer: Self-pay

## 2017-08-04 ENCOUNTER — Ambulatory Visit: Payer: Medicare Other | Admitting: Internal Medicine

## 2017-08-04 ENCOUNTER — Inpatient Hospital Stay (HOSPITAL_COMMUNITY)
Admission: EM | Admit: 2017-08-04 | Discharge: 2017-08-07 | DRG: 027 | Disposition: A | Discharge status: Home Health | Payer: Worker's Compensation | Attending: Neurosurgery | Admitting: Neurosurgery

## 2017-08-04 DIAGNOSIS — Z87891 Personal history of nicotine dependence: Secondary | ICD-10-CM | POA: Diagnosis not present

## 2017-08-04 DIAGNOSIS — Z87820 Personal history of traumatic brain injury: Secondary | ICD-10-CM

## 2017-08-04 DIAGNOSIS — Z8249 Family history of ischemic heart disease and other diseases of the circulatory system: Secondary | ICD-10-CM | POA: Diagnosis not present

## 2017-08-04 DIAGNOSIS — S065XAA Traumatic subdural hemorrhage with loss of consciousness status unknown, initial encounter: Secondary | ICD-10-CM

## 2017-08-04 DIAGNOSIS — I6202 Nontraumatic subacute subdural hemorrhage: Secondary | ICD-10-CM | POA: Diagnosis present

## 2017-08-04 DIAGNOSIS — K219 Gastro-esophageal reflux disease without esophagitis: Secondary | ICD-10-CM | POA: Diagnosis present

## 2017-08-04 DIAGNOSIS — J3089 Other allergic rhinitis: Secondary | ICD-10-CM | POA: Diagnosis present

## 2017-08-04 DIAGNOSIS — E1140 Type 2 diabetes mellitus with diabetic neuropathy, unspecified: Secondary | ICD-10-CM | POA: Diagnosis present

## 2017-08-04 DIAGNOSIS — E785 Hyperlipidemia, unspecified: Secondary | ICD-10-CM | POA: Diagnosis present

## 2017-08-04 DIAGNOSIS — S065X9A Traumatic subdural hemorrhage with loss of consciousness of unspecified duration, initial encounter: Principal | ICD-10-CM | POA: Diagnosis present

## 2017-08-04 DIAGNOSIS — I10 Essential (primary) hypertension: Secondary | ICD-10-CM | POA: Diagnosis present

## 2017-08-04 DIAGNOSIS — Z888 Allergy status to other drugs, medicaments and biological substances status: Secondary | ICD-10-CM

## 2017-08-04 DIAGNOSIS — I6200 Nontraumatic subdural hemorrhage, unspecified: Secondary | ICD-10-CM | POA: Diagnosis not present

## 2017-08-04 DIAGNOSIS — Z880 Allergy status to penicillin: Secondary | ICD-10-CM | POA: Diagnosis not present

## 2017-08-04 DIAGNOSIS — G4733 Obstructive sleep apnea (adult) (pediatric): Secondary | ICD-10-CM | POA: Diagnosis present

## 2017-08-04 DIAGNOSIS — R531 Weakness: Secondary | ICD-10-CM

## 2017-08-04 DIAGNOSIS — R404 Transient alteration of awareness: Secondary | ICD-10-CM | POA: Diagnosis not present

## 2017-08-04 HISTORY — PX: BURR HOLE: SHX908

## 2017-08-04 HISTORY — DX: Unspecified intracranial injury with loss of consciousness status unknown, initial encounter: S06.9XAA

## 2017-08-04 HISTORY — DX: Unspecified intracranial injury with loss of consciousness of unspecified duration, initial encounter: S06.9X9A

## 2017-08-04 LAB — PROTIME-INR
INR: 0.98
Prothrombin Time: 12.9 seconds (ref 11.4–15.2)

## 2017-08-04 LAB — I-STAT TROPONIN, ED: Troponin i, poc: 0.01 ng/mL (ref 0.00–0.08)

## 2017-08-04 LAB — CBC
HCT: 33.3 % — ABNORMAL LOW (ref 39.0–52.0)
Hemoglobin: 10.6 g/dL — ABNORMAL LOW (ref 13.0–17.0)
MCH: 27.1 pg (ref 26.0–34.0)
MCHC: 31.8 g/dL (ref 30.0–36.0)
MCV: 85.2 fL (ref 78.0–100.0)
Platelets: 189 10*3/uL (ref 150–400)
RBC: 3.91 MIL/uL — ABNORMAL LOW (ref 4.22–5.81)
RDW: 13.5 % (ref 11.5–15.5)
WBC: 7.3 10*3/uL (ref 4.0–10.5)

## 2017-08-04 LAB — URINALYSIS, ROUTINE W REFLEX MICROSCOPIC
Bacteria, UA: NONE SEEN
Bilirubin Urine: NEGATIVE
Glucose, UA: NEGATIVE mg/dL
Ketones, ur: NEGATIVE mg/dL
Leukocytes, UA: NEGATIVE
Nitrite: NEGATIVE
Protein, ur: 30 mg/dL — AB
Specific Gravity, Urine: 1.019 (ref 1.005–1.030)
Squamous Epithelial / LPF: NONE SEEN
pH: 5 (ref 5.0–8.0)

## 2017-08-04 LAB — HEPATIC FUNCTION PANEL
ALT: 19 U/L (ref 17–63)
AST: 20 U/L (ref 15–41)
Albumin: 3.5 g/dL (ref 3.5–5.0)
Alkaline Phosphatase: 85 U/L (ref 38–126)
Bilirubin, Direct: <0.1 mg/dL — ABNORMAL LOW (ref 0.1–0.5)
Total Bilirubin: 0.6 mg/dL (ref 0.3–1.2)
Total Protein: 6.6 g/dL (ref 6.5–8.1)

## 2017-08-04 LAB — BASIC METABOLIC PANEL
Anion gap: 11 (ref 5–15)
BUN: 45 mg/dL — ABNORMAL HIGH (ref 6–20)
CO2: 24 mmol/L (ref 22–32)
Calcium: 9.3 mg/dL (ref 8.9–10.3)
Chloride: 103 mmol/L (ref 101–111)
Creatinine, Ser: 1.42 mg/dL — ABNORMAL HIGH (ref 0.61–1.24)
GFR calc Af Amer: 53 mL/min — ABNORMAL LOW (ref >60)
GFR calc non Af Amer: 46 mL/min — ABNORMAL LOW (ref >60)
Glucose, Bld: 155 mg/dL — ABNORMAL HIGH (ref 65–99)
Potassium: 4 mmol/L (ref 3.5–5.1)
Sodium: 138 mmol/L (ref 135–145)

## 2017-08-04 LAB — GLUCOSE, CAPILLARY: Glucose-Capillary: 98 mg/dL (ref 65–99)

## 2017-08-04 LAB — ABO/RH: ABO/RH(D): B POS

## 2017-08-04 SURGERY — CREATION, CRANIAL BURR HOLE
Anesthesia: General | Site: Head | Laterality: Bilateral

## 2017-08-04 MED ORDER — ARTIFICIAL TEARS OPHTHALMIC OINT
TOPICAL_OINTMENT | OPHTHALMIC | Status: AC
  Filled 2017-08-04: qty 3.5

## 2017-08-04 MED ORDER — SUGAMMADEX SODIUM 200 MG/2ML IV SOLN
INTRAVENOUS | Status: DC | PRN
  Administered 2017-08-04: 200 mg via INTRAVENOUS

## 2017-08-04 MED ORDER — SODIUM CHLORIDE 0.9 % IR SOLN
Status: DC | PRN
  Administered 2017-08-04: 500 mL

## 2017-08-04 MED ORDER — ONDANSETRON HCL 4 MG/2ML IJ SOLN: 4.0000 mg | Freq: Once | INTRAMUSCULAR | Status: DC | PRN

## 2017-08-04 MED ORDER — LIDOCAINE-EPINEPHRINE 1 %-1:100000 IJ SOLN
INTRAMUSCULAR | Status: DC | PRN
  Administered 2017-08-04: 3 mL via INTRADERMAL

## 2017-08-04 MED ORDER — LIDOCAINE HCL (CARDIAC) 20 MG/ML IV SOLN
INTRAVENOUS | Status: DC | PRN
  Administered 2017-08-04: 60 mg via INTRAVENOUS

## 2017-08-04 MED ORDER — SODIUM CHLORIDE 0.9 % IV BOLUS (SEPSIS): 500.0000 mL | Freq: Once | INTRAVENOUS | Status: DC

## 2017-08-04 MED ORDER — CEFAZOLIN SODIUM-DEXTROSE 2-3 GM-%(50ML) IV SOLR
INTRAVENOUS | Status: DC | PRN
  Administered 2017-08-04: 2 g via INTRAVENOUS

## 2017-08-04 MED ORDER — ARTIFICIAL TEARS OPHTHALMIC OINT
TOPICAL_OINTMENT | OPHTHALMIC | Status: DC | PRN
  Administered 2017-08-04: 1 via OPHTHALMIC

## 2017-08-04 MED ORDER — FENTANYL CITRATE (PF) 100 MCG/2ML IJ SOLN
INTRAMUSCULAR | Status: AC
  Filled 2017-08-04: qty 2

## 2017-08-04 MED ORDER — OXYCODONE HCL 5 MG PO TABS: 5.0000 mg | ORAL_TABLET | Freq: Once | ORAL | Status: DC | PRN

## 2017-08-04 MED ORDER — FENTANYL CITRATE (PF) 100 MCG/2ML IJ SOLN
INTRAMUSCULAR | Status: DC | PRN
  Administered 2017-08-04: 25 ug via INTRAVENOUS
  Administered 2017-08-04: 75 ug via INTRAVENOUS
  Administered 2017-08-04: 50 ug via INTRAVENOUS

## 2017-08-04 MED ORDER — LIDOCAINE 2% (20 MG/ML) 5 ML SYRINGE
INTRAMUSCULAR | Status: AC
  Filled 2017-08-04: qty 5

## 2017-08-04 MED ORDER — LACTATED RINGERS IV SOLN: INTRAVENOUS | Status: DC | PRN

## 2017-08-04 MED ORDER — DEXTROSE 5 % IV SOLN
INTRAVENOUS | Status: DC | PRN
  Administered 2017-08-04: 50 ug/min via INTRAVENOUS

## 2017-08-04 MED ORDER — THROMBIN (RECOMBINANT) 5000 UNITS EX SOLR
CUTANEOUS | Status: AC
  Filled 2017-08-04: qty 5000

## 2017-08-04 MED ORDER — BACITRACIN ZINC 500 UNIT/GM EX OINT
TOPICAL_OINTMENT | CUTANEOUS | Status: AC
  Filled 2017-08-04: qty 28.35

## 2017-08-04 MED ORDER — ONDANSETRON HCL 4 MG/2ML IJ SOLN
INTRAMUSCULAR | Status: AC
  Filled 2017-08-04: qty 2

## 2017-08-04 MED ORDER — THROMBIN (RECOMBINANT) 20000 UNITS EX SOLR
CUTANEOUS | Status: AC
  Filled 2017-08-04: qty 20000

## 2017-08-04 MED ORDER — SODIUM CHLORIDE 0.9 % IV SOLN
INTRAVENOUS | Status: DC | PRN
  Administered 2017-08-04 (×2): via INTRAVENOUS

## 2017-08-04 MED ORDER — EPHEDRINE 5 MG/ML INJ
INTRAVENOUS | Status: AC
  Filled 2017-08-04: qty 10

## 2017-08-04 MED ORDER — THROMBIN (RECOMBINANT) 20000 UNITS EX SOLR
CUTANEOUS | Status: DC | PRN
  Administered 2017-08-04: 20 mL

## 2017-08-04 MED ORDER — CEFAZOLIN SODIUM-DEXTROSE 2-4 GM/100ML-% IV SOLN
INTRAVENOUS | Status: AC
  Filled 2017-08-04: qty 100

## 2017-08-04 MED ORDER — ROCURONIUM BROMIDE 10 MG/ML (PF) SYRINGE
PREFILLED_SYRINGE | INTRAVENOUS | Status: AC
  Filled 2017-08-04: qty 5

## 2017-08-04 MED ORDER — FENTANYL CITRATE (PF) 100 MCG/2ML IJ SOLN
25.0000 ug | INTRAMUSCULAR | Status: DC | PRN
  Administered 2017-08-04: 25 ug via INTRAVENOUS

## 2017-08-04 MED ORDER — 0.9 % SODIUM CHLORIDE (POUR BTL) OPTIME
TOPICAL | Status: DC | PRN
  Administered 2017-08-04 (×2): 1000 mL

## 2017-08-04 MED ORDER — ROCURONIUM BROMIDE 100 MG/10ML IV SOLN
INTRAVENOUS | Status: DC | PRN
  Administered 2017-08-04: 50 mg via INTRAVENOUS

## 2017-08-04 MED ORDER — PROPOFOL 10 MG/ML IV BOLUS
INTRAVENOUS | Status: AC
  Filled 2017-08-04: qty 20

## 2017-08-04 MED ORDER — EPHEDRINE SULFATE 50 MG/ML IJ SOLN
INTRAMUSCULAR | Status: DC | PRN
  Administered 2017-08-04: 5 mg via INTRAVENOUS

## 2017-08-04 MED ORDER — OXYCODONE HCL 5 MG/5ML PO SOLN: 5.0000 mg | Freq: Once | ORAL | Status: DC | PRN

## 2017-08-04 MED ORDER — ONDANSETRON HCL 4 MG/2ML IJ SOLN
INTRAMUSCULAR | Status: DC | PRN
  Administered 2017-08-04: 4 mg via INTRAVENOUS

## 2017-08-04 MED ORDER — PROPOFOL 10 MG/ML IV BOLUS
INTRAVENOUS | Status: DC | PRN
  Administered 2017-08-04: 120 mg via INTRAVENOUS

## 2017-08-04 MED ORDER — BACITRACIN ZINC 500 UNIT/GM EX OINT
TOPICAL_OINTMENT | CUTANEOUS | Status: DC | PRN
  Administered 2017-08-04: 1 via TOPICAL

## 2017-08-04 MED ORDER — FENTANYL CITRATE (PF) 250 MCG/5ML IJ SOLN
INTRAMUSCULAR | Status: AC
  Filled 2017-08-04: qty 5

## 2017-08-04 MED ORDER — LIDOCAINE-EPINEPHRINE 1 %-1:100000 IJ SOLN
INTRAMUSCULAR | Status: AC
  Filled 2017-08-04: qty 1

## 2017-08-04 MED ORDER — SODIUM CHLORIDE 0.9 % IV BOLUS (SEPSIS)
500.0000 mL | Freq: Once | INTRAVENOUS | Status: AC
  Administered 2017-08-04: 500 mL via INTRAVENOUS

## 2017-08-04 MED ORDER — SUGAMMADEX SODIUM 200 MG/2ML IV SOLN
INTRAVENOUS | Status: AC
  Filled 2017-08-04: qty 2

## 2017-08-04 MED ORDER — PHENYLEPHRINE 40 MCG/ML (10ML) SYRINGE FOR IV PUSH (FOR BLOOD PRESSURE SUPPORT)
PREFILLED_SYRINGE | INTRAVENOUS | Status: AC
  Filled 2017-08-04: qty 10

## 2017-08-04 SURGICAL SUPPLY — 80 items
BAG DECANTER FOR FLEXI CONT (MISCELLANEOUS) ×2 IMPLANT
BANDAGE GAUZE 4  KLING STR (GAUZE/BANDAGES/DRESSINGS) IMPLANT
BLADE CLIPPER SURG (BLADE) IMPLANT
BLADE SURG 11 STRL SS (BLADE) ×2 IMPLANT
BNDG COHESIVE 4X5 TAN NS LF (GAUZE/BANDAGES/DRESSINGS) IMPLANT
BUR ACORN 9.0 PRECISION (BURR) ×2 IMPLANT
CABLE BIPOLOR RESECTION CORD (MISCELLANEOUS) ×2 IMPLANT
CANISTER SUCT 3000ML PPV (MISCELLANEOUS) ×2 IMPLANT
CARTRIDGE OIL MAESTRO DRILL (MISCELLANEOUS) ×1 IMPLANT
CATH ROBINSON RED A/P 10FR (CATHETERS) ×2 IMPLANT
CLIP VESOCCLUDE MED 6/CT (CLIP) IMPLANT
DECANTER SPIKE VIAL GLASS SM (MISCELLANEOUS) ×2 IMPLANT
DERMABOND ADVANCED (GAUZE/BANDAGES/DRESSINGS) ×1
DERMABOND ADVANCED .7 DNX12 (GAUZE/BANDAGES/DRESSINGS) ×1 IMPLANT
DIFFUSER DRILL AIR PNEUMATIC (MISCELLANEOUS) ×2 IMPLANT
DRAPE NEUROLOGICAL W/INCISE (DRAPES) IMPLANT
DRAPE SURG 17X23 STRL (DRAPES) IMPLANT
DRAPE WARM FLUID 44X44 (DRAPE) ×2 IMPLANT
DRSG OPSITE POSTOP 3X4 (GAUZE/BANDAGES/DRESSINGS) ×6 IMPLANT
ELECT CAUTERY BLADE 6.4 (BLADE) ×2 IMPLANT
ELECT REM PT RETURN 9FT ADLT (ELECTROSURGICAL) ×2
ELECTRODE REM PT RTRN 9FT ADLT (ELECTROSURGICAL) ×1 IMPLANT
GAUZE SPONGE 4X4 12PLY STRL (GAUZE/BANDAGES/DRESSINGS) IMPLANT
GAUZE SPONGE 4X4 16PLY XRAY LF (GAUZE/BANDAGES/DRESSINGS) IMPLANT
GLOVE BIO SURGEON STRL SZ 6.5 (GLOVE) IMPLANT
GLOVE BIO SURGEON STRL SZ7 (GLOVE) ×6 IMPLANT
GLOVE BIO SURGEON STRL SZ7.5 (GLOVE) IMPLANT
GLOVE BIO SURGEON STRL SZ8 (GLOVE) ×2 IMPLANT
GLOVE BIO SURGEON STRL SZ8.5 (GLOVE) IMPLANT
GLOVE BIOGEL M 8.0 STRL (GLOVE) IMPLANT
GLOVE BIOGEL PI IND STRL 7.0 (GLOVE) IMPLANT
GLOVE BIOGEL PI IND STRL 7.5 (GLOVE) ×2 IMPLANT
GLOVE BIOGEL PI INDICATOR 7.0 (GLOVE)
GLOVE BIOGEL PI INDICATOR 7.5 (GLOVE) ×2
GLOVE ECLIPSE 6.5 STRL STRAW (GLOVE) IMPLANT
GLOVE ECLIPSE 7.0 STRL STRAW (GLOVE) IMPLANT
GLOVE ECLIPSE 7.5 STRL STRAW (GLOVE) IMPLANT
GLOVE ECLIPSE 8.0 STRL XLNG CF (GLOVE) IMPLANT
GLOVE ECLIPSE 8.5 STRL (GLOVE) IMPLANT
GLOVE EXAM NITRILE LRG STRL (GLOVE) IMPLANT
GLOVE EXAM NITRILE XL STR (GLOVE) IMPLANT
GLOVE EXAM NITRILE XS STR PU (GLOVE) IMPLANT
GLOVE INDICATOR 6.5 STRL GRN (GLOVE) IMPLANT
GLOVE INDICATOR 7.0 STRL GRN (GLOVE) IMPLANT
GLOVE INDICATOR 7.5 STRL GRN (GLOVE) IMPLANT
GLOVE INDICATOR 8.0 STRL GRN (GLOVE) IMPLANT
GLOVE INDICATOR 8.5 STRL (GLOVE) ×2 IMPLANT
GLOVE OPTIFIT SS 7.5 STRL LX (GLOVE) ×2 IMPLANT
GLOVE OPTIFIT SS 8.0 STRL (GLOVE) IMPLANT
GLOVE SURG SS PI 6.5 STRL IVOR (GLOVE) IMPLANT
GOWN STRL REUS W/ TWL LRG LVL3 (GOWN DISPOSABLE) ×1 IMPLANT
GOWN STRL REUS W/ TWL XL LVL3 (GOWN DISPOSABLE) ×1 IMPLANT
GOWN STRL REUS W/TWL 2XL LVL3 (GOWN DISPOSABLE) ×2 IMPLANT
GOWN STRL REUS W/TWL LRG LVL3 (GOWN DISPOSABLE) ×1
GOWN STRL REUS W/TWL XL LVL3 (GOWN DISPOSABLE) ×1
HEMOSTAT SURGICEL 2X14 (HEMOSTASIS) IMPLANT
HOOK DURA (MISCELLANEOUS) ×2 IMPLANT
KIT BASIN OR (CUSTOM PROCEDURE TRAY) ×2 IMPLANT
KIT ROOM TURNOVER OR (KITS) ×2 IMPLANT
NEEDLE HYPO 25X1 1.5 SAFETY (NEEDLE) ×2 IMPLANT
NS IRRIG 1000ML POUR BTL (IV SOLUTION) ×2 IMPLANT
OIL CARTRIDGE MAESTRO DRILL (MISCELLANEOUS) ×2
PACK CRANIOTOMY (CUSTOM PROCEDURE TRAY) ×2 IMPLANT
PAD ARMBOARD 7.5X6 YLW CONV (MISCELLANEOUS) ×6 IMPLANT
PATTIES SURGICAL .25X.25 (GAUZE/BANDAGES/DRESSINGS) IMPLANT
PATTIES SURGICAL .5 X.5 (GAUZE/BANDAGES/DRESSINGS) IMPLANT
PATTIES SURGICAL .5 X3 (DISPOSABLE) IMPLANT
PATTIES SURGICAL 1X1 (DISPOSABLE) IMPLANT
PIN MAYFIELD SKULL DISP (PIN) IMPLANT
SPONGE NEURO XRAY DETECT 1X3 (DISPOSABLE) IMPLANT
SPONGE SURGIFOAM ABS GEL 100 (HEMOSTASIS) IMPLANT
STAPLER VISISTAT 35W (STAPLE) ×2 IMPLANT
SUT NURALON 4 0 TR CR/8 (SUTURE) ×4 IMPLANT
SUT VIC AB 2-0 CT1 18 (SUTURE) ×2 IMPLANT
SYR CONTROL 10ML LL (SYRINGE) ×2 IMPLANT
TOWEL GREEN STERILE (TOWEL DISPOSABLE) ×2 IMPLANT
TOWEL GREEN STERILE FF (TOWEL DISPOSABLE) IMPLANT
TRAY FOLEY W/METER SILVER 14FR (SET/KITS/TRAYS/PACK) ×2 IMPLANT
TRAY FOLEY W/METER SILVER 16FR (SET/KITS/TRAYS/PACK) IMPLANT
WATER STERILE IRR 1000ML POUR (IV SOLUTION) ×2 IMPLANT

## 2017-08-04 NOTE — ED Provider Notes (Signed)
Emergency Department Provider Note   I have reviewed the triage vital signs and the nursing notes.   HISTORY  Chief Complaint Weakness   HPI Frank [name] is a 77 y.o. male with PMH of DM, HLD, HTN, and recent TBI and concern for intraparenchymal hemorrhage presents to the emergency department for evaluation of worsening weakness.  Patient describes generalized weakness and fatigue in the setting of significantly increasing his walking distance.  He was discharged in late December 2018 has been working with physical therapy and occupational therapy at home.  Over the past 3 days his family has been taking him out of the house on errands including a trip to [employer].  Patient's wife states that yesterday she did not think he was going to be able to make it out of the department store.  He had to take frequent breaks very weak.  This morning he seemed more weak and had trouble getting out of bed.  He denies any pain.  Specifically no chest pain, shortness of breath, palpitations.  No lightheadedness with standing.  Family states is not been drinking very well.  No fevers or chills.  Patient did have a mild headache yesterday but took Tylenol and this resolved.  No additional falls or head trauma.   Past Medical History:  Diagnosis Date  . Allergy    Rhinitis  . Anemia    NOS iron deficient and B12 deficient  . Arthritis   . Diabetes mellitus    Type 2  . GERD (gastroesophageal reflux disease)   . Hyperlipidemia   . Hypertension   . 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 OSA (obstructive sleep apnea)    cpap  . TBI (traumatic brain injury) [facility])     Patient Active Problem List   Diagnosis Date Noted  . Subdural hematoma ([place]) 08/04/2017  . GERD without esophagitis 07/22/2017  . Need for shingles vaccine 07/20/2017  . Stage 3 chronic kidney disease ([place])   . Acute blood loss anemia   . Hyponatremia   . Traumatic intracranial hemorrhage ([place]) 06/28/2017  .  Depression   . Type 2 diabetes mellitus with peripheral neuropathy ([name])   . Intracranial hematoma following injury ([place]) 06/23/2017  . Morbid obesity ([place]) 05/12/2017  . OAB (overactive bladder) 11/26/2014  . Routine general medical examination at a health care facility 04/06/2014  . Vitamin D deficiency 07/23/2010  . Obstructive sleep apnea 09/13/2008  . Hyperlipidemia with target LDL less than 100 07/27/2007  . BPH (benign prostatic hyperplasia) 06/21/2007  . 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 B12 deficiency anemia 04/15/2006  . Essential hypertension 04/15/2006  . ALLERGIC RHINITIS 04/15/2006    Past Surgical History:  Procedure Laterality Date  . APPENDECTOMY    . arm fracture Left 2011   with hardware  . COLONOSCOPY    . ESOPHAGOGASTRODUODENOSCOPY  2006   gastritis  . ROTATOR CUFF REPAIR    . SEPTOPLASTY  1959   Deviated Septum  . 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 Heart disease Mother   . Breast cancer Mother   . Stroke Father   . Allergies Father        Father and children  . Coronary artery disease Brother   . Diabetes Neg Hx   . Colon cancer Neg Hx     Social History Social History   Tobacco  Use  . Smoking status: Former Smoker    Packs/day: 1.00    Last attempt to quit: 07/14/1983    Years since quitting: 34.0  . Smokeless tobacco: Never Used  Substance Use Topics  . Alcohol use: Yes    Alcohol/week: 1.0 oz    Types: 2 Standard drinks or equivalent per week    Comment: occassionally  . Drug use: No    Review of Systems  Constitutional: No fever/chills. Positive generalized weakness.  Eyes: No visual changes. ENT: No sore throat. Cardiovascular: Denies chest pain. Respiratory: Denies shortness of breath. Gastrointestinal: No abdominal pain.  No nausea, no vomiting.  No diarrhea.  No constipation. Genitourinary: Negative  for dysuria. Musculoskeletal: Negative for back pain. Skin: Negative for rash. Neurological: Negative for headaches, focal weakness or numbness.  10-point ROS otherwise negative.  ____________________________________________   PHYSICAL EXAM:  VITAL SIGNS: ED Triage Vitals  Enc Vitals Group     BP 08/04/17 1713 (!) 122/58     Pulse Rate 08/04/17 1713 67     Resp 08/04/17 1713 20     Temp 08/04/17 1713 98.3 F (36.8 C)     Temp Source 08/04/17 1713 Oral     SpO2 08/04/17 1713 97 %     Weight 08/04/17 1756 207 lb (93.9 kg)     Height 08/04/17 1756 5\' 10"  (1.778 m)     Pain Score 08/04/17 1756 0    Constitutional: Alert and oriented. Well appearing and in no acute distress. Eyes: Conjunctivae are normal Head: Atraumatic. Nose: No congestion/rhinnorhea. Mouth/Throat: Mucous membranes are moist.  Oropharynx non-erythematous. Neck: No stridor. Cardiovascular: Normal rate, regular rhythm. Good peripheral circulation. Grossly normal heart sounds.   Respiratory: Normal respiratory effort.  No retractions. Lungs CTAB. Gastrointestinal: Soft and nontender. No distention.  Musculoskeletal: No lower extremity tenderness nor edema. No gross deformities of extremities. Neurologic:  Normal speech and language. No gross focal neurologic deficits are appreciated. Equal strength in bilateral upper and lower extremities.  Skin:  Skin is warm, dry and intact. No rash noted.  ____________________________________________   LABS (all labs ordered are listed, but only abnormal results are displayed)  Labs Reviewed  BASIC METABOLIC PANEL - Abnormal; Notable for the following components:      Result Value   Glucose, Bld 155 (*)    BUN 45 (*)    Creatinine, Ser 1.42 (*)    GFR calc non Af Amer 46 (*)    GFR calc Af Amer 53 (*)    All other components within normal limits  CBC - Abnormal; Notable for the following components:   RBC 3.91 (*)    Hemoglobin 10.6 (*)    HCT 33.3 (*)    All  other components within normal limits  URINALYSIS, ROUTINE W REFLEX MICROSCOPIC - Abnormal; Notable for the following components:   Hgb urine dipstick SMALL (*)    Protein, ur 30 (*)    All other components within normal limits  HEPATIC FUNCTION PANEL - Abnormal; Notable for the following components:   Bilirubin, Direct <0.1 (*)    All other components within normal limits  PROTIME-INR  GLUCOSE, CAPILLARY  BASIC METABOLIC PANEL  CBC  I-STAT TROPONIN, ED  TYPE AND SCREEN  ABO/RH   ____________________________________________  EKG   EKG Interpretation  Date/Time:  Wednesday August 04 2017 18:09:02 EST Ventricular Rate:  74 PR Interval:    QRS Duration: 104 QT Interval:  409 QTC Calculation: 454 R Axis:   49 Text Interpretation:  Sinus rhythm Borderline ST elevation, anterior leads Baseline wander in lead(s) V3 No STEMI.  Confirmed by Nanda Quinton [id number] on 08/04/2017 6:13:20 PM       ____________________________________________  RADIOLOGY  Ct Head Wo Contrast  Result Date: 08/04/2017 CLINICAL DATA:  77 year old male with a history of traumatic brain injury December 12th. Weakness EXAM: CT HEAD WITHOUT CONTRAST TECHNIQUE: Contiguous axial images were obtained from the base of the skull through the vertex without intravenous contrast. COMPARISON:  06/23/2017, 03/14/2017, MRI 06/24/2017 FINDINGS: Brain: New bilateral supratentorial extra-axial fluid collections, compatible with subdural hematoma. Intermediate density within the fluid bilaterally with no evidence of acute blood products. Linear differential density overlying the right frontal region, within the subdural collection may represent vasculature or subacute blood products. The larger is the left side, which extends from the anterior cranial fossa overlying the left frontal lobe to the posterior, overlying the parietal lobe. Greatest thickness measured on the coronal reformatted images approximately 17 mm in thickness.  There is associated mass effect on the left hemisphere with left to right midline shift measured at the foramen of Monro approximately 8 mm. Associated compression of the left lateral ventricle. The right subdural collection extends from the frontal region over the hemisphere to the parietal region. Greatest thickness measured on coronal reformatted images of approximately 12 mm in thickness. There is associated compression on the underlying right hemisphere. Gray-white differentiation maintained. 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 Vascular: Calcifications of the anterior circulation. Skull: No acute bony abnormality or aggressive bone lesion. Sinuses/Orbits: Bilateral proptosis, with similar configuration to the prior. No mastoid effusion. Scattered ethmoid air cell opacification. Other: None IMPRESSION: New bilateral cerebral convexity intermediate density subdural hematoma, with associated mass effect. Greatest thickness of the left is 17 mm and the right is 12 mm, with net mass effect resulting in left to right midline shift at foramen of Monro of approximately 8 mm. Linear hyperdensity in the anterior right fluid collection may represent vasculature or small subacute blood products. Focal hypodensity in the posterior right parietal region, corresponds to evolving encephalomalacia after prior focal hemorrhage. These results were called by telephone at the time of interpretation on 08/04/2017 at 7:11 pm to Dr. Nanda Quinton , who verbally acknowledged these results. Electronically Signed   By: Corrie Mckusick D.O.   On: 08/04/2017 19:11    ____________________________________________   PROCEDURES  Procedure(s) performed:   .Critical Care  Performed by: Margette Fast, MD  Authorized by: Margette Fast, MD   Critical care provider statement:    Critical care time (minutes):  45    Critical care time was exclusive of:  Separately billable procedures and treating other patients and teaching time   Critical care was necessary to treat or prevent imminent or life-threatening deterioration of the following conditions:  CNS failure or compromise   Critical care was time spent personally by me on the following activities:  Blood draw for specimens, development of treatment plan with patient or surrogate, discussions with consultants, evaluation of patient's response to treatment, examination of patient, ordering and performing treatments and interventions, ordering and review of laboratory studies, ordering and review of radiographic studies, pulse oximetry, re-evaluation of patient's condition and review of old charts   I assumed direction of critical care for this patient from another provider in my specialty: no    ___________________________________________   INITIAL IMPRESSION / ASSESSMENT AND PLAN / ED COURSE  Pertinent labs & imaging results that were available during my care of the patient were reviewed by me and considered in my medical decision making (see chart for details).  Patient presents to the emergency department for evaluation of generalized weakness in the setting of significantly increasing his activity.  Patient had an admission in late December after head injury.  Low suspicion for worsening intracerebral hemorrhage given no new falls or trauma.  Family is concerned that they may have worn him out by doing too much too soon which is possible given the history.  Plan for screening labs, repeat CT imaging, reassess after IVF bolus.   Spoke with Dr. Saintclair Halsted regarding the CT findings and presentation. Patient last ate at 4 PM. Sending coags. Patient will likely need surgery this evening. He will discuss further with family.   Discussed patient's case with NSG, Dr. Saintclair Halsted to request admission. Patient and family (if present) updated with plan. Care transferred to [place]  service.  I reviewed all nursing notes, vitals, pertinent old records, EKGs, labs, imaging (as available).  ____________________________________________  FINAL CLINICAL IMPRESSION(S) / ED DIAGNOSES  Final diagnoses:  SDH (subdural hematoma) ([name])  Weakness     MEDICATIONS GIVEN DURING THIS VISIT:  Medications  ceFAZolin (ANCEF) 2-4 GM/100ML-% IVPB (not administered)  fentaNYL (SUBLIMAZE) injection 25-50 mcg (25 mcg Intravenous Given 08/04/17 2316)  ondansetron (ZOFRAN) injection 4 mg (not administered)  oxyCODONE (Oxy IR/ROXICODONE) immediate release tablet 5 mg (not administered)    Or  oxyCODONE (ROXICODONE) 5 MG/5ML solution 5 mg (not administered)  fentaNYL (SUBLIMAZE) 100 MCG/2ML injection (not administered)  sodium chloride 0.9 % bolus 500 mL (500 mLs Intravenous New Bag/Given 08/04/17 1824)    Note:  This document was prepared using Dragon voice recognition software and may include unintentional dictation errors.  Nanda Quinton, MD Emergency Medicine   Long, [facility], MD 08/04/17 [phone]

## 2017-08-04 NOTE — H&P (Signed)
Frank [name] is an 77 y.o. male.   Chief Complaint: Dizziness confusion and headaches HPI: 77 year old gentleman who was admitted to the hospital about 6 weeks ago with a fall and small and parenchymal contusion. Patient was observed was in rehabilitation for a little while and patient was discharged. He been doing fairly well following up with his rehabilitation doctor who was ordering outpatient follow-up CT week ago when the patient over the last 2 days his had progressive difficulty with balance and ambulation dizziness and confusion. Family brought him into the ER where follow-up CT scan showed bilateral subacute subdural hematomas. Largest one is on the left with left-to-right midline shift. Right side is also not insignificant. I have extensively gone over the risks and benefits of bilateral bur holes with the patient and family as well as perioperative course expectations of outcome and alternatives of surgery and they understand and agree to proceed forward.  Past Medical History:  Diagnosis Date  . Allergy    Rhinitis  . Anemia    NOS iron deficient and B12 deficient  . Arthritis   . Diabetes mellitus    Type 2  . GERD (gastroesophageal reflux disease)   . Hyperlipidemia   . Hypertension   . 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 OSA (obstructive sleep apnea)    cpap  . TBI (traumatic brain injury) [facility])     Past Surgical History:  Procedure Laterality Date  . APPENDECTOMY    . arm fracture Left 2011   with hardware  . COLONOSCOPY    . ESOPHAGOGASTRODUODENOSCOPY  2006   gastritis  . ROTATOR CUFF REPAIR    . SEPTOPLASTY  1959   Deviated Septum  . 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 Heart disease Mother   . Breast cancer Mother   . Stroke Father   . Allergies Father        Father and children  . Coronary artery disease Brother   . Diabetes Neg Hx   . Colon cancer Neg Hx    Social History:  reports  that he quit smoking about 34 years ago. He smoked 1.00 pack per day. he has never used smokeless tobacco. He reports that he drinks about 1.0 oz of alcohol per week. He reports that he does not use drugs.  Allergies:  Allergies  Allergen Reactions  . Lisinopril Cough  . Penicillins     REACTION: unspecified  . Sulfamethoxazole     REACTION: as child     (Not in a hospital admission)  Results for orders placed or performed during the hospital encounter of 08/04/17 (from the past 48 hour(s))  Basic metabolic panel     Status: Abnormal   Collection Time: 08/04/17  6:30 PM  Result Value Ref Range   Sodium 138 135 - 145 mmol/L   Potassium 4.0 3.5 - 5.1 mmol/L   Chloride 103 101 - 111 mmol/L   CO2 24 22 - 32 mmol/L   Glucose, Bld 155 (H) 65 - 99 mg/dL   BUN 45 (H) 6 - 20 mg/dL   Creatinine, Ser 1.42 (H) 0.61 - 1.24 mg/dL   Calcium 9.3 8.9 - 10.3 mg/dL   GFR calc non Af Amer 46 (L) >60 mL/min   GFR calc Af Amer 53 (L) >60 mL/min    Comment: (NOTE) The eGFR has been calculated using the CKD EPI equation. This calculation has not been  validated in all clinical situations. eGFR's persistently <60 mL/min signify possible Chronic Kidney Disease.    Anion gap 11 5 - 15  CBC     Status: Abnormal   Collection Time: 08/04/17  6:30 PM  Result Value Ref Range   WBC 7.3 4.0 - 10.5 K/uL   RBC 3.91 (L) 4.22 - 5.81 MIL/uL   Hemoglobin 10.6 (L) 13.0 - 17.0 g/dL   HCT 33.3 (L) 39.0 - 52.0 %   MCV 85.2 78.0 - 100.0 fL   MCH 27.1 26.0 - 34.0 pg   MCHC 31.8 30.0 - 36.0 g/dL   RDW 13.5 11.5 - 15.5 %   Platelets 189 150 - 400 K/uL  Hepatic function panel     Status: Abnormal   Collection Time: 08/04/17  6:30 PM  Result Value Ref Range   Total Protein 6.6 6.5 - 8.1 g/dL   Albumin 3.5 3.5 - 5.0 g/dL   AST 20 15 - 41 U/L   ALT 19 17 - 63 U/L   Alkaline Phosphatase 85 38 - 126 U/L   Total Bilirubin 0.6 0.3 - 1.2 mg/dL   Bilirubin, Direct <0.1 (L) 0.1 - 0.5 mg/dL   Indirect Bilirubin NOT  CALCULATED 0.3 - 0.9 mg/dL  I-stat troponin, ED     Status: None   Collection Time: 08/04/17  6:35 PM  Result Value Ref Range   Troponin i, poc 0.01 0.00 - 0.08 ng/mL   Comment 3            Comment: Due to the release kinetics of cTnI, a negative result within the first hours of the onset of symptoms does not rule out myocardial infarction with certainty. If myocardial infarction is still suspected, repeat the test at appropriate intervals.    Ct Head Wo Contrast  Result Date: 08/04/2017 CLINICAL DATA:  77 year old male with a history of traumatic brain injury December 12th. Weakness EXAM: CT HEAD WITHOUT CONTRAST TECHNIQUE: Contiguous axial images were obtained from the base of the skull through the vertex without intravenous contrast. COMPARISON:  06/23/2017, 03/14/2017, MRI 06/24/2017 FINDINGS: Brain: New bilateral supratentorial extra-axial fluid collections, compatible with subdural hematoma. Intermediate density within the fluid bilaterally with no evidence of acute blood products. Linear differential density overlying the right frontal region, within the subdural collection may represent vasculature or subacute blood products. The larger is the left side, which extends from the anterior cranial fossa overlying the left frontal lobe to the posterior, overlying the parietal lobe. Greatest thickness measured on the coronal reformatted images approximately 17 mm in thickness. There is associated mass effect on the left hemisphere with left to right midline shift measured at the foramen of Monro approximately 8 mm. Associated compression of the left lateral ventricle. The right subdural collection extends from the frontal region over the hemisphere to the parietal region. Greatest thickness measured on coronal reformatted images of approximately 12 mm in thickness. There is associated compression on the underlying right hemisphere. Gray-white differentiation maintained. Focal hypodensity in the  posterior right parietal region, correlates with encephalomalacia of evolving hemorrhagic infarction identified on prior imaging. Vascular: Calcifications of the anterior circulation. Skull: No acute bony abnormality or aggressive bone lesion. Sinuses/Orbits: Bilateral proptosis, with similar configuration to the prior. No mastoid effusion. Scattered ethmoid air cell opacification. Other: None IMPRESSION: New bilateral cerebral convexity intermediate density subdural hematoma, with associated mass effect. Greatest thickness of the left is 17 mm and the right is 12 mm, with net mass effect resulting in left to  right midline shift at foramen of Monro of approximately 8 mm. Linear hyperdensity in the anterior right fluid collection may represent vasculature or small subacute blood products. Focal hypodensity in the posterior right parietal region, corresponds to evolving encephalomalacia after prior focal hemorrhage. These results were called by telephone at the time of interpretation on 08/04/2017 at 7:11 pm to Dr. Nanda Quinton , who verbally acknowledged these results. Electronically Signed   By: Corrie Mckusick D.O.   On: 08/04/2017 19:11    Review of Systems  Neurological: Positive for dizziness, sensory change, speech change and headaches.    Blood pressure (!) 143/73, pulse 75, temperature 98.3 F (36.8 C), temperature source Oral, resp. rate 14, height 5' 10"  (1.778 m), weight 93.9 kg (207 lb), SpO2 97 %. Physical Exam  Constitutional: He is oriented to person, place, and time. He appears well-developed.  HENT:  Head: Normocephalic.  Eyes: Pupils are equal, round, and reactive to light.  Neck: Normal range of motion.  GI: Soft.  Neurological: He is oriented to person, place, and time. GCS eye subscore is 4. GCS verbal subscore is 5. GCS motor subscore is 6.  Patient is awake and alert peoples equal x-ray was intact cranial nerves are intact. Strength is 5 out of 5 upper and lower extremities with no  pronator drift.  Skin: Skin is warm and dry.     Assessment/Plan 77 year old gentleman presents with bilateral subacute subdural hematomas for burr holes.  Frank [name] P, MD 08/04/2017, 8:41 PM

## 2017-08-04 NOTE — ED Triage Notes (Signed)
Pt has been slowly improving since a traumatic brain injury on Dec 12.  Last week family began taking pt out to eat, shopping, etc.  Monday, pt became significantly weaker.  Denies urinary s/s or cough.

## 2017-08-04 NOTE — Anesthesia Procedure Notes (Signed)
Procedure Name: Intubation Date/Time: 08/04/2017 9:37 PM Performed by: Suzy Bouchard, CRNA Pre-anesthesia Checklist: Patient identified, Emergency Drugs available, Suction available, Timeout performed and Patient being monitored Patient Re-evaluated:Patient Re-evaluated prior to induction Oxygen Delivery Method: Circle system utilized Preoxygenation: Pre-oxygenation with 100% oxygen Induction Type: IV induction Ventilation: Mask ventilation without difficulty and Oral airway inserted - appropriate to patient size Laryngoscope Size: Sabra Heck and 2 Grade View: Grade I Tube type: Oral Tube size: 7.5 mm Number of attempts: 1 Airway Equipment and Method: Stylet Placement Confirmation: ETT inserted through vocal cords under direct vision,  positive ETCO2 and breath sounds checked- equal and bilateral Secured at: 22 cm Tube secured with: Tape Dental Injury: Teeth and Oropharynx as per pre-operative assessment

## 2017-08-04 NOTE — Telephone Encounter (Signed)
Initial call from son who was not with pt at time of call. 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 Son unable to provide additional information other than "increasingly weak and sleeping a lot." Son also states pt will say he is OK and nothing is wrong but family sees a change in him as far as lethargy. They have also noted a change in gait "shuffling." Called and spoke to pt and wife. Pt states he does feel very weak, wife added he could not sit up in bed yesterday but a little better today. Pt is able to ambulate with frequent rest breaks and assist, uses walker. Reports headache yesterday, did not score, relieved with tylenol. Denies any nausea, vomiting, no one sided weakness or speech difficulties. Does report dizziness with positional changes "lasts about one minute."   Pt does have home PT and son states "Had him out doing a lot over the weekend." Wife states "He has deteriorated since Monday." Instructed to go to ED. Pt's wife stated she would call EMS as she was not sure she could safely get him to car as son was not at home to assist.  Reason for Disposition . Patient sounds very sick or weak to the triager  Answer Assessment - Initial Assessment Questions 1. DESCRIPTION: "Describe how you are feeling."     "Weak"  Son states progressively worse since Monday.  2. SEVERITY: "How bad is it?"  "Can you stand and walk?"   - MILD - Feels weak or tired, but does not interfere with work, school or normal activities   - [place] to stand and walk; weakness interferes with work, school, or normal activities   - SEVERE - Unable to stand or walk     Unable to sit up yesterday, better today, requiring assist to ambulate (in addition to Rolator) Frequent rest breaks. Wife states "deteriorated since Monday." 3. ONSET:  "When did the weakness begin?"     Monday 4. CAUSE: "What do you think is causing the weakness?"     Not sure 5.  MEDICINES: "Have you recently started a new medicine or had a change in the amount of a medicine?"     No 6. OTHER SYMPTOMS: "Do you have any other symptoms?" (e.g., chest pain, fever, cough, SOB, vomiting, diarrhea, bleeding)    Pt's wife and son states pt is sleeping more than usual. Pt reports headache yesterday, relieved with tylenol. Denies nausea, vomiting, denies numbness, one sided weakness.  Pt has H/O TBI , ICH s/p fall 06/23/17.  Protocols used: WEAKNESS (GENERALIZED) AND FATIGUE-A-AH

## 2017-08-04 NOTE — Anesthesia Preprocedure Evaluation (Addendum)
Anesthesia Evaluation  Patient identified by MRN, date of birth, ID band Patient awake    Reviewed: Allergy & Precautions, NPO status , Patient's Chart, lab work & pertinent test results  Airway Mallampati: II  TM Distance: >3 FB Neck ROM: Full    Dental  (+) Teeth Intact, Dental Advisory Given   Pulmonary sleep apnea and Continuous Positive Airway Pressure Ventilation , former smoker,    breath sounds clear to auscultation       Cardiovascular hypertension, Pt. on medications  Rhythm:Regular Rate:Normal     Neuro/Psych    GI/Hepatic GERD  ,  Endo/Other  diabetes, Type 2, Oral Hypoglycemic Agents  Renal/GU Renal disease     Musculoskeletal   Abdominal   Peds  Hematology   Anesthesia Other Findings   Reproductive/Obstetrics                            Anesthesia Physical Anesthesia Plan  ASA: IV and emergent  Anesthesia Plan: General   Post-op Pain Management:    Induction: Intravenous  PONV Risk Score and Plan: Ondansetron  Airway Management Planned: Oral ETT  Additional Equipment:   Intra-op Plan:   Post-operative Plan: Extubation in OR  Informed Consent: I have reviewed the patients History and Physical, chart, labs and discussed the procedure including the risks, benefits and alternatives for the proposed anesthesia with the patient or authorized representative who has indicated his/her understanding and acceptance.   Dental advisory given  Plan Discussed with: CRNA and Anesthesiologist  Anesthesia Plan Comments:         Anesthesia Quick Evaluation

## 2017-08-04 NOTE — Transfer of Care (Signed)
Immediate Anesthesia Transfer of Care Note  Patient: Rolf Fells  Procedure(s) Performed: Haskell Flirt (Bilateral Head)  Patient Location: PACU  Anesthesia Type:General  Level of Consciousness: drowsy  Airway & Oxygen Therapy: Patient Spontanous Breathing and Patient connected to nasal cannula oxygen  Post-op Assessment: Report given to RN, Post -op Vital signs reviewed and stable and Patient moving all extremities  Post vital signs: Reviewed and stable  Last Vitals:  Vitals:   08/04/17 1713 08/04/17 1930  BP: (!) 122/58 (!) 143/73  Pulse: 67 75  Resp: 20 14  Temp: 36.8 C   SpO2: 97% 97%    Last Pain:  Vitals:   08/04/17 1944  TempSrc:   PainSc: 0-No pain         Complications: No apparent anesthesia complications

## 2017-08-04 NOTE — Op Note (Signed)
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  EBL: Minimal  History of present illness: 77 year old gentleman who had a previous head injury back and being of December was doing fairly well until a few days ago had significant decline in mental status balance and cognition. Brought to the ER workup showed bilateral subacute to chronic subdural hematomas left one measuring 17 mm with 8 mm of shift and a 12 mm 1 on the right. Due to patient progressive clinical syndrome imaging findings and failure conservative treatment I recommended bilateral burr hole craniectomy for evacuation. I extensively went over the risks and benefits of the operation with the patient as well as perioperative course expectations of outcome and alternatives of surgery and he and his family understood and agreed to proceed forward.  Operative procedure: Patient brought into the or was induced on general anesthesia positioned supine and ligament head looking strata neck in slight infection flexion to bur holes were drawn out on the left side of his head after adequate prepping and draping 1 on the right all 3 of these were infiltrated incised and 3 bur holes were drilled the dura was coagulated and incised in a cruciate fashion first working on the left side both the dura were opened up and a lot of subacute blood under pressure came out I then irrigated through with a red rubber catheter until nothing but clear irrigant was coming through either burr hole with irrigation. I elected not to put a drain and secondary to the potential of introduction of pneumocephalus the brain did not fill up the space very quickly although cortical surface was easily visualized. Then the  right-sided bur hole was opened up the dura was incised in a cruciate fashion again again some old blood came out under mild to moderate amount of pressure. Then after adequate irrigation of both bur holes was achieved both the scalp incisions were closed with after Vicryl skin is closed with staples. Wounds were dressed patient recovered in stable condition. The end the case all needle counts sponge counts were correct.

## 2017-08-05 ENCOUNTER — Inpatient Hospital Stay (HOSPITAL_COMMUNITY): Payer: Worker's Compensation

## 2017-08-05 ENCOUNTER — Encounter (HOSPITAL_COMMUNITY): Payer: Self-pay | Admitting: Neurosurgery

## 2017-08-05 DIAGNOSIS — S065XAA Traumatic subdural hemorrhage with loss of consciousness status unknown, initial encounter: Secondary | ICD-10-CM | POA: Diagnosis present

## 2017-08-05 DIAGNOSIS — S065X9A Traumatic subdural hemorrhage with loss of consciousness of unspecified duration, initial encounter: Secondary | ICD-10-CM | POA: Diagnosis present

## 2017-08-05 LAB — CBC
HCT: 33.5 % — ABNORMAL LOW (ref 39.0–52.0)
Hemoglobin: 10.8 g/dL — ABNORMAL LOW (ref 13.0–17.0)
MCH: 27.2 pg (ref 26.0–34.0)
MCHC: 32.2 g/dL (ref 30.0–36.0)
MCV: 84.4 fL (ref 78.0–100.0)
Platelets: 174 10*3/uL (ref 150–400)
RBC: 3.97 MIL/uL — ABNORMAL LOW (ref 4.22–5.81)
RDW: 13.1 % (ref 11.5–15.5)
WBC: 8.6 10*3/uL (ref 4.0–10.5)

## 2017-08-05 LAB — BASIC METABOLIC PANEL
Anion gap: 11 (ref 5–15)
Anion gap: 9 (ref 5–15)
BUN: 32 mg/dL — ABNORMAL HIGH (ref 6–20)
BUN: 27 mg/dL — ABNORMAL HIGH (ref 6–20)
CO2: 27 mmol/L (ref 22–32)
CO2: 25 mmol/L (ref 22–32)
Calcium: 8.9 mg/dL (ref 8.9–10.3)
Calcium: 8.7 mg/dL — ABNORMAL LOW (ref 8.9–10.3)
Chloride: 109 mmol/L (ref 101–111)
Chloride: 104 mmol/L (ref 101–111)
Creatinine, Ser: 1.37 mg/dL — ABNORMAL HIGH (ref 0.61–1.24)
Creatinine, Ser: 1.07 mg/dL (ref 0.61–1.24)
GFR calc Af Amer: 55 mL/min — ABNORMAL LOW (ref >60)
GFR calc Af Amer: >60 mL/min (ref >60)
GFR calc non Af Amer: 48 mL/min — ABNORMAL LOW (ref >60)
GFR calc non Af Amer: >60 mL/min (ref >60)
Glucose, Bld: 127 mg/dL — ABNORMAL HIGH (ref 65–99)
Glucose, Bld: 164 mg/dL — ABNORMAL HIGH (ref 65–99)
Potassium: 3.9 mmol/L (ref 3.5–5.1)
Potassium: 3.9 mmol/L (ref 3.5–5.1)
Sodium: 147 mmol/L — ABNORMAL HIGH (ref 135–145)
Sodium: 138 mmol/L (ref 135–145)

## 2017-08-05 LAB — GLUCOSE, CAPILLARY
Glucose-Capillary: 106 mg/dL — ABNORMAL HIGH (ref 65–99)
Glucose-Capillary: 110 mg/dL — ABNORMAL HIGH (ref 65–99)
Glucose-Capillary: 118 mg/dL — ABNORMAL HIGH (ref 65–99)
Glucose-Capillary: 119 mg/dL — ABNORMAL HIGH (ref 65–99)
Glucose-Capillary: 151 mg/dL — ABNORMAL HIGH (ref 65–99)

## 2017-08-05 LAB — TYPE AND SCREEN
ABO/RH(D): B POS
Antibody Screen: NEGATIVE

## 2017-08-05 LAB — MRSA PCR SCREENING: MRSA by PCR: NEGATIVE

## 2017-08-05 MED ORDER — ONDANSETRON HCL 4 MG/2ML IJ SOLN: 4.0000 mg | INTRAMUSCULAR | Status: DC | PRN

## 2017-08-05 MED ORDER — GLIPIZIDE ER 10 MG PO TB24
10.0000 mg | ORAL_TABLET | Freq: Every day | ORAL | Status: DC
  Administered 2017-08-05 – 2017-08-07 (×3): 10 mg via ORAL
  Filled 2017-08-05 (×5): qty 1

## 2017-08-05 MED ORDER — POLYSACCHARIDE IRON COMPLEX 150 MG PO CAPS
150.0000 mg | ORAL_CAPSULE | Freq: Every day | ORAL | Status: DC
  Administered 2017-08-05 – 2017-08-07 (×3): 150 mg via ORAL
  Filled 2017-08-05 (×3): qty 1

## 2017-08-05 MED ORDER — LABETALOL HCL 5 MG/ML IV SOLN: 10.0000 mg | INTRAVENOUS | Status: DC | PRN

## 2017-08-05 MED ORDER — OXYBUTYNIN CHLORIDE 5 MG PO TABS
5.0000 mg | ORAL_TABLET | Freq: Two times a day (BID) | ORAL | Status: DC
  Administered 2017-08-05 – 2017-08-07 (×5): 5 mg via ORAL
  Filled 2017-08-05 (×8): qty 1

## 2017-08-05 MED ORDER — SODIUM CHLORIDE 0.9 % IV SOLN
500.0000 mg | Freq: Two times a day (BID) | INTRAVENOUS | Status: DC
  Administered 2017-08-05 – 2017-08-07 (×6): 500 mg via INTRAVENOUS
  Filled 2017-08-05 (×6): qty 5

## 2017-08-05 MED ORDER — AMLODIPINE BESYLATE 10 MG PO TABS
10.0000 mg | ORAL_TABLET | Freq: Every day | ORAL | Status: DC
  Administered 2017-08-05 – 2017-08-07 (×3): 10 mg via ORAL
  Filled 2017-08-05 (×3): qty 1

## 2017-08-05 MED ORDER — FENTANYL CITRATE (PF) 100 MCG/2ML IJ SOLN
INTRAMUSCULAR | Status: AC
  Filled 2017-08-05: qty 2

## 2017-08-05 MED ORDER — PANTOPRAZOLE SODIUM 40 MG IV SOLR: 40.0000 mg | Freq: Every day | INTRAVENOUS | Status: DC

## 2017-08-05 MED ORDER — PANTOPRAZOLE SODIUM 40 MG PO TBEC
40.0000 mg | DELAYED_RELEASE_TABLET | Freq: Every day | ORAL | Status: DC
  Administered 2017-08-05 – 2017-08-07 (×3): 40 mg via ORAL
  Filled 2017-08-05 (×3): qty 1

## 2017-08-05 MED ORDER — LORATADINE 10 MG PO TABS
10.0000 mg | ORAL_TABLET | Freq: Every day | ORAL | Status: DC
  Administered 2017-08-05 – 2017-08-07 (×3): 10 mg via ORAL
  Filled 2017-08-05 (×3): qty 1

## 2017-08-05 MED ORDER — ONDANSETRON HCL 4 MG PO TABS: 4.0000 mg | ORAL_TABLET | ORAL | Status: DC | PRN

## 2017-08-05 MED ORDER — IRBESARTAN 75 MG PO TABS
75.0000 mg | ORAL_TABLET | Freq: Every day | ORAL | Status: DC
  Administered 2017-08-05 – 2017-08-07 (×3): 75 mg via ORAL
  Filled 2017-08-05 (×3): qty 1

## 2017-08-05 MED ORDER — CEFAZOLIN SODIUM-DEXTROSE 2-4 GM/100ML-% IV SOLN
2.0000 g | Freq: Three times a day (TID) | INTRAVENOUS | Status: AC
  Administered 2017-08-05 (×2): 2 g via INTRAVENOUS
  Filled 2017-08-05 (×2): qty 100

## 2017-08-05 MED ORDER — HYDROCODONE-ACETAMINOPHEN 5-325 MG PO TABS
1.0000 | ORAL_TABLET | ORAL | Status: DC | PRN
  Administered 2017-08-05 – 2017-08-06 (×4): 1 via ORAL
  Filled 2017-08-05 (×4): qty 1

## 2017-08-05 MED ORDER — PROMETHAZINE HCL 25 MG PO TABS: 12.5000 mg | ORAL_TABLET | ORAL | Status: DC | PRN

## 2017-08-05 MED ORDER — LINAGLIPTIN 5 MG PO TABS
5.0000 mg | ORAL_TABLET | Freq: Every day | ORAL | Status: DC
  Administered 2017-08-05 – 2017-08-07 (×3): 5 mg via ORAL
  Filled 2017-08-05 (×3): qty 1

## 2017-08-05 MED ORDER — FLUOXETINE HCL 20 MG PO CAPS
20.0000 mg | ORAL_CAPSULE | Freq: Every day | ORAL | Status: DC
  Administered 2017-08-05 – 2017-08-07 (×3): 20 mg via ORAL
  Filled 2017-08-05 (×3): qty 1

## 2017-08-05 MED ORDER — ATORVASTATIN CALCIUM 10 MG PO TABS
10.0000 mg | ORAL_TABLET | Freq: Every day | ORAL | Status: DC
  Administered 2017-08-05 – 2017-08-07 (×3): 10 mg via ORAL
  Filled 2017-08-05 (×3): qty 1

## 2017-08-05 MED ORDER — INSULIN ASPART 100 UNIT/ML ~~LOC~~ SOLN
0.0000 [IU] | Freq: Three times a day (TID) | SUBCUTANEOUS | Status: DC
  Administered 2017-08-05 – 2017-08-06 (×2): 3 [IU] via SUBCUTANEOUS
  Administered 2017-08-06: 2 [IU] via SUBCUTANEOUS
  Administered 2017-08-06: 3 [IU] via SUBCUTANEOUS
  Administered 2017-08-07: 5 [IU] via SUBCUTANEOUS

## 2017-08-05 MED ORDER — DOCUSATE SODIUM 100 MG PO CAPS
100.0000 mg | ORAL_CAPSULE | Freq: Two times a day (BID) | ORAL | Status: DC
  Administered 2017-08-05 – 2017-08-07 (×5): 100 mg via ORAL
  Filled 2017-08-05 (×5): qty 1

## 2017-08-05 MED ORDER — POTASSIUM CHLORIDE IN NACL 20-0.9 MEQ/L-% IV SOLN
INTRAVENOUS | Status: DC
  Administered 2017-08-05 (×2): via INTRAVENOUS
  Filled 2017-08-05 (×4): qty 1000

## 2017-08-05 MED ORDER — ACETAMINOPHEN 500 MG PO TABS
500.0000 mg | ORAL_TABLET | Freq: Four times a day (QID) | ORAL | Status: DC | PRN
  Administered 2017-08-05 – 2017-08-06 (×2): 500 mg via ORAL
  Filled 2017-08-05 (×3): qty 1

## 2017-08-05 MED ORDER — FLUTICASONE PROPIONATE 50 MCG/ACT NA SUSP: 1.0000 | Freq: Every day | NASAL | Status: DC | PRN

## 2017-08-05 MED ORDER — HYDROMORPHONE HCL 1 MG/ML IJ SOLN
0.5000 mg | INTRAMUSCULAR | Status: DC | PRN
  Administered 2017-08-05 (×3): 0.5 mg via INTRAVENOUS
  Filled 2017-08-05 (×3): qty 0.5

## 2017-08-05 NOTE — Progress Notes (Signed)
Subjective: Patient reports Procedure well has limited a headache but otherwise no other complaints.  Objective: Vital signs in last 24 hours: Temp:  [97.8 F (36.6 C)-98.3 F (36.8 C)] 98.1 F (36.7 C) (01/24 0400) Pulse Rate:  [67-84] 73 (01/24 0700) Resp:  [11-21] 21 (01/24 0700) BP: (121-166)/(56-78) 149/69 (01/24 0700) SpO2:  [91 %-100 %] 100 % (01/24 0700) Weight:  [93.9 kg (207 lb)] 93.9 kg (207 lb) (01/23 1756)  Intake/Output from previous day: 01/23 0701 - 01/24 0700 In: 1118.8 [I.V.:1118.8] Out: 1600 [Urine:1550; Blood:50] Intake/Output this shift: No intake/output data recorded.  Awake alert oriented strength 5 out of 5  Lab Results: Recent Labs    08/04/17 1830 08/05/17 0030  WBC 7.3 8.6  HGB 10.6* 10.8*  HCT 33.3* 33.5*  PLT 189 174   BMET Recent Labs    08/05/17 0030 08/05/17 0432  NA 147* 138  K 3.9 3.9  CL 109 104  CO2 27 25  GLUCOSE 127* 164*  BUN 32* 27*  CREATININE 1.37* 1.07  CALCIUM 8.9 8.7*    Studies/Results: Ct Head Wo Contrast  Result Date: 08/05/2017 CLINICAL DATA:  Follow-up examination for subdural hemorrhage. EXAM: CT HEAD WITHOUT CONTRAST TECHNIQUE: Contiguous axial images were obtained from the base of the skull through the vertex without intravenous contrast. COMPARISON:  Prior CT from 08/04/2017. FINDINGS: Brain: Postoperative changes from interval bilateral burr hole placement for subdural evacuation is seen. Two burr holes have been performed on the left with a single burr hole on the right. Postoperative pneumocephalus present within the extra-axial space bilaterally. There has been partial evaluation of the previously seen bilateral subdural collections, overall decreased in size from previous. Collection on the left now measures up to 12 mm at the level of the left frontal convexity, previously 16 mm. Collection on the right also slightly decreased measuring up to 10 mm in maximal thickness at the level of the right parietal  convexity. Small amount of acute blood products seen layering posteriorly within these collections. Persistent but improved left-to-right shift measuring 6 mm. No hydrocephalus or ventricular trapping. Basilar cisterns remain patent. No other acute intracranial abnormality. Small remote lacunar infarct noted at the right caudate. No acute large vessel territory infarct. No other acute intracranial hemorrhage. No mass lesion. Vascular: No hyperdense vessel. Scattered vascular calcifications noted within the carotid siphons. Skull: Skin staples overlie the bilateral burr hole craniotomies. Associated soft tissue swelling with emphysema. Sinuses/Orbits: Globes and orbital soft tissues within normal limits. Patient status post lens extraction bilaterally. Metallic foreign body just superior to the left globe again noted, unchanged. Scattered ethmoidal, right sphenoid, and right maxillary sinus mucosal thickening, likely chronic. Visualized mastoid air cells are clear. Other: None. IMPRESSION: 1. Postoperative changes from interval bilateral burr hole craniotomy for subdural evacuation. No complication. 2. Bilateral subdural collections overall decreased in size as compared to previous. Persistent but improved 6 mm of left-to-right midline shift. No hydrocephalus or ventricular trapping. 3. No other new acute intracranial abnormality. Electronically Signed   By: Jeannine Boga M.D.   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 Weakness EXAM: CT HEAD WITHOUT CONTRAST TECHNIQUE: Contiguous axial images were obtained from the base of the skull through the vertex without intravenous contrast. COMPARISON:  06/23/2017, 03/14/2017, MRI 06/24/2017 FINDINGS: Brain: New bilateral supratentorial extra-axial fluid collections, compatible with subdural hematoma. Intermediate density within the fluid bilaterally  with  no evidence of acute blood products. Linear differential density overlying the right frontal region, within the subdural collection may represent vasculature or subacute blood products. The larger is the left side, which extends from the anterior cranial fossa overlying the left frontal lobe to the posterior, overlying the parietal lobe. Greatest thickness measured on the coronal reformatted images approximately 17 mm in thickness. There is associated mass effect on the left hemisphere with left to right midline shift measured at the foramen of Monro approximately 8 mm. Associated compression of the left lateral ventricle. The right subdural collection extends from the frontal region over the hemisphere to the parietal region. Greatest thickness measured on coronal reformatted images of approximately 12 mm in thickness. There is associated compression on the underlying right hemisphere. Gray-white differentiation maintained. 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 Vascular: Calcifications of the anterior circulation. Skull: No acute bony abnormality or aggressive bone lesion. Sinuses/Orbits: Bilateral proptosis, with similar configuration to the prior. No mastoid effusion. Scattered ethmoid air cell opacification. Other: None IMPRESSION: New bilateral cerebral convexity intermediate density subdural hematoma, with associated mass effect. Greatest thickness of the left is 17 mm and the right is 12 mm, with net mass effect resulting in left to right midline shift at foramen of Monro of approximately 8 mm. Linear hyperdensity in the anterior right fluid collection may represent vasculature or small subacute blood products. Focal hypodensity in the posterior right parietal region, corresponds to evolving encephalomalacia after prior focal hemorrhage. These results were called by telephone at the time  of interpretation on 08/04/2017 at 7:11 pm to Dr. Nanda Quinton , who verbally acknowledged these results. Electronically Signed   By: Corrie Mckusick D.O.   On: 08/04/2017 19:11    Assessment/Plan: Postop day 1 burr hole craniectomy for subdural hematoma. Follow up CT scan shows significant improvement just in pneumocephalus. Mobilized today with physical and occupational therapy.  LOS: 1 day     Aveion Nguyen P 08/05/2017, 7:56 AM

## 2017-08-05 NOTE — Plan of Care (Signed)
Pt ate 50% of meal trays. Pt calm and cooperative, does not appear anxious. Pt comfortable, pain controlled w/ PRN dilaudid and vicodin.

## 2017-08-05 NOTE — Anesthesia Postprocedure Evaluation (Signed)
Anesthesia Post Note  Patient: Frank [name]  Procedure(s) Performed: Haskell Flirt (Bilateral Head)     Patient location during evaluation: PACU Anesthesia Type: General Level of consciousness: awake and alert Pain management: pain level controlled Vital Signs Assessment: post-procedure vital signs reviewed and stable Respiratory status: spontaneous breathing, nonlabored ventilation, respiratory function stable and patient connected to nasal cannula oxygen Cardiovascular status: blood pressure returned to baseline and stable Postop Assessment: no apparent nausea or vomiting Anesthetic complications: no    Last Vitals:  Vitals:   08/05/17 1800 08/05/17 1900  BP: (!) 144/65 (!) 148/73  Pulse: 72 72  Resp: 19 19  Temp:    SpO2: 100% 100%    Last Pain:  Vitals:   08/05/17 1940  TempSrc:   PainSc: 5                  Anasia Agro COKER

## 2017-08-06 LAB — GLUCOSE, CAPILLARY
Glucose-Capillary: 136 mg/dL — ABNORMAL HIGH (ref 65–99)
Glucose-Capillary: 161 mg/dL — ABNORMAL HIGH (ref 65–99)
Glucose-Capillary: 155 mg/dL — ABNORMAL HIGH (ref 65–99)
Glucose-Capillary: 132 mg/dL — ABNORMAL HIGH (ref 65–99)

## 2017-08-06 NOTE — Progress Notes (Signed)
Subjective: Patient reports Patient well with minimal headache no nausea  Objective: Vital signs in last 24 hours: Temp:  [97.8 F (36.6 C)-98.3 F (36.8 C)] 98.2 F (36.8 C) (01/25 1135) Pulse Rate:  [65-94] 76 (01/25 1200) Resp:  [11-40] 21 (01/25 1200) BP: (120-166)/(59-101) 136/70 (01/25 1200) SpO2:  [93 %-100 %] 98 % (01/25 1200)  Intake/Output from previous day: 01/24 0701 - 01/25 0700 In: 2215 [I.V.:1800; IV Piggyback:415] Out: 1185 [Urine:1185] Intake/Output this shift: Total I/O In: 480 [I.V.:375; IV Piggyback:105] Out: 325 [Urine:325]  Awake alert and oriented neurologically nonfocal  Lab Results: Recent Labs    08/04/17 1830 08/05/17 0030  WBC 7.3 8.6  HGB 10.6* 10.8*  HCT 33.3* 33.5*  PLT 189 174   BMET Recent Labs    08/05/17 0030 08/05/17 0432  NA 147* 138  K 3.9 3.9  CL 109 104  CO2 27 25  GLUCOSE 127* 164*  BUN 32* 27*  CREATININE 1.37* 1.07  CALCIUM 8.9 8.7*    Studies/Results: Ct Head Wo Contrast  Result Date: 08/05/2017 CLINICAL DATA:  Follow-up examination for subdural hemorrhage. EXAM: CT HEAD WITHOUT CONTRAST TECHNIQUE: Contiguous axial images were obtained from the base of the skull through the vertex without intravenous contrast. COMPARISON:  Prior CT from 08/04/2017. FINDINGS: Brain: Postoperative changes from interval bilateral burr hole placement for subdural evacuation is seen. Two burr holes have been performed on the left with a single burr hole on the right. Postoperative pneumocephalus present within the extra-axial space bilaterally. There has been partial evaluation of the previously seen bilateral subdural collections, overall decreased in size from previous. Collection on the left now measures up to 12 mm at the level of the left frontal convexity, previously 16 mm. Collection on the right also slightly decreased measuring up to 10 mm in maximal thickness at the level of the right parietal convexity. Small amount of acute blood  products seen layering posteriorly within these collections. Persistent but improved left-to-right shift measuring 6 mm. No hydrocephalus or ventricular trapping. Basilar cisterns remain patent. No other acute intracranial abnormality. Small remote lacunar infarct noted at the right caudate. No acute large vessel territory infarct. No other acute intracranial hemorrhage. No mass lesion. Vascular: No hyperdense vessel. Scattered vascular calcifications noted within the carotid siphons. Skull: Skin staples overlie the bilateral burr hole craniotomies. Associated soft tissue swelling with emphysema. Sinuses/Orbits: Globes and orbital soft tissues within normal limits. Patient status post lens extraction bilaterally. Metallic foreign body just superior to the left globe again noted, unchanged. Scattered ethmoidal, right sphenoid, and right maxillary sinus mucosal thickening, likely chronic. Visualized mastoid air cells are clear. Other: None. IMPRESSION: 1. Postoperative changes from interval bilateral burr hole craniotomy for subdural evacuation. No complication. 2. Bilateral subdural collections overall decreased in size as compared to previous. Persistent but improved 6 mm of left-to-right midline shift. No hydrocephalus or ventricular trapping. 3. No other new acute intracranial abnormality. Electronically Signed   By: Jeannine Boga M.D.   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 Weakness EXAM: CT HEAD WITHOUT CONTRAST TECHNIQUE: Contiguous axial images were obtained from the base of the skull through the vertex without intravenous contrast. COMPARISON:  06/23/2017, 03/14/2017, MRI 06/24/2017 FINDINGS: Brain: New bilateral supratentorial extra-axial fluid collections, compatible with subdural hematoma. Intermediate density within the fluid bilaterally with no evidence of acute blood  products. Linear differential density overlying the  right frontal region, within the subdural collection may represent vasculature or subacute blood products. The larger is the left side, which extends from the anterior cranial fossa overlying the left frontal lobe to the posterior, overlying the parietal lobe. Greatest thickness measured on the coronal reformatted images approximately 17 mm in thickness. There is associated mass effect on the left hemisphere with left to right midline shift measured at the foramen of Monro approximately 8 mm. Associated compression of the left lateral ventricle. The right subdural collection extends from the frontal region over the hemisphere to the parietal region. Greatest thickness measured on coronal reformatted images of approximately 12 mm in thickness. There is associated compression on the underlying right hemisphere. Gray-white differentiation maintained. 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 Vascular: Calcifications of the anterior circulation. Skull: No acute bony abnormality or aggressive bone lesion. Sinuses/Orbits: Bilateral proptosis, with similar configuration to the prior. No mastoid effusion. Scattered ethmoid air cell opacification. Other: None IMPRESSION: New bilateral cerebral convexity intermediate density subdural hematoma, with associated mass effect. Greatest thickness of the left is 17 mm and the right is 12 mm, with net mass effect resulting in left to right midline shift at foramen of Monro of approximately 8 mm. Linear hyperdensity in the anterior right fluid collection may represent vasculature or small subacute blood products. Focal hypodensity in the posterior right parietal region, corresponds to evolving encephalomalacia after prior focal hemorrhage. These results were called by telephone at the time of interpretation on 08/04/2017 at  7:11 pm to Dr. Nanda Quinton , who verbally acknowledged these results. Electronically Signed   By: Corrie Mckusick D.O.   On: 08/04/2017 19:11    Assessment/Plan: Postoperative day to burr hole craniectomy for evacuation of bilateral subacute to chronic subdural hematomas. Patient recovered well and doing very well. Patient should be able to be discharged potentially over the weekend however we need to make sure that social work case management discusses with the family especially the wife and everybody is comfortable take care of the patient with what he would need at home.  LOS: 2 days     Nezar Buckles P 08/06/2017, 1:09 PM

## 2017-08-06 NOTE — Evaluation (Signed)
Occupational Therapy Evaluation Patient Details Name: Frank [name] MRN: [id number] DOB: 1940/09/03 Today's Date: 08/06/2017    History of Present Illness 77 year old gentleman with PMH of DM, HTN, hyperlipidemia who was admitted to the hospital about 6 weeks ago with a fall and small and parenchymal contusion.  Returned to ED due to dizziness, imbalance and confusion.  Follow-up CT scan showed bilateral subacute subdural hematomas.  Now s/p bilateral bur holes.   Clinical Impression   This 77 yo male admitted and underwent above presents to acute OT with decreased flexability thus making it difficult for LBD, and decreased balance thus affecting his safety and independence with basic ADLs. He will benefit from acute OT with follow up [place] to get back to PLOF.    Follow Up Recommendations  Home health OT;Supervision/Assistance - 24 hour    Equipment Recommendations  None recommended by OT       Precautions / Restrictions Precautions Precautions: Fall Precaution Comments: reports fear of falling again Restrictions Weight Bearing Restrictions: No      Mobility Bed Mobility Overal bed mobility: Needs Assistance Bed Mobility: Supine to Sit;Sit to Supine     Supine to sit: Min assist(HOB flat and no rail (but wife reports they now have a rail he can use)) Sit to supine: Min guard      Transfers Overall transfer level: Needs assistance Equipment used: Rolling walker (2 wheeled) Transfers: Sit to/from Stand Sit to Stand: Min guard         General transfer comment: cues for hand placement and increased time    Balance Overall balance assessment: Needs assistance Sitting-balance support: No upper extremity supported;Feet supported Sitting balance-Leahy Scale: Good     Standing balance support: Bilateral upper extremity supported Standing balance-Leahy Scale: Poor Standing balance comment: UE support in standing                           ADL either  performed or assessed with clinical judgement   ADL Overall ADL's : Needs assistance/impaired Eating/Feeding: Independent;Sitting   Grooming: Standing;Min guard   Upper Body Bathing: Set up;Sitting   Lower Body Bathing: Minimal assistance Lower Body Bathing Details (indicate cue type and reason): min guard A sit<>stand Upper Body Dressing : Set up;Sitting   Lower Body Dressing: Minimal assistance Lower Body Dressing Details (indicate cue type and reason): min guard A sit<>stand Toilet Transfer: Min guard;Ambulation;Comfort height toilet;Grab bars;RW   Toileting- Water quality scientist and Hygiene: Min guard;Sit to/from stand         General ADL Comments: Pt reports one of his issues is getting up to bathroom quickly at night; I recommended he may want to use BSC instead of walking to bathroom at night     Vision Baseline Vision/History: Wears glasses Wears Glasses: Reading only Patient Visual Report: No change from baseline              Pertinent Vitals/Pain Pain Assessment: 0-10 Pain Score: 2  Pain Location: head Pain Descriptors / Indicators: Headache Pain Intervention(s): Monitored during session     Hand Dominance Right   Extremity/Trunk Assessment Upper Extremity Assessment Upper Extremity Assessment: Overall WFL for tasks assessed           Communication Communication Communication: No difficulties   Cognition Arousal/Alertness: Awake/alert Behavior During Therapy: WFL for tasks assessed/performed Overall Cognitive Status: Within Functional Limits for tasks assessed  Home Living Family/patient expects to be discharged to:: Private residence Living Arrangements: Spouse/significant other Available Help at Discharge: Family;Available 24 hours/day Type of Home: House Home Access: Stairs to enter [employer] of Steps: 4 Entrance Stairs-Rails: Can reach both Home Layout: One  level     Bathroom Shower/Tub: Occupational psychologist: Handicapped height     Home Equipment: Grab bars - tub/shower;Grab bars - toilet;Walker - 4 wheels;Shower seat;Adaptive equipment Adaptive Equipment: Sock aid        Prior Functioning/Environment Level of Independence: Independent with assistive device(s)        Comments: I love to drive, but they told me I can't yet        OT Problem List: Decreased strength;Impaired balance (sitting and/or standing);Pain      OT Treatment/Interventions: Self-care/ADL training;Balance training;DME and/or AE instruction;Patient/family education    OT Goals(Current goals can be found in the care plan section) Acute Rehab OT Goals Patient Stated Goal: To go home OT Goal Formulation: With patient Time For Goal Achievement: 08/13/17 Potential to Achieve Goals: Good  OT Frequency: Min 2X/week              AM-PAC PT "6 Clicks" Daily Activity     Outcome Measure Help from another person eating meals?: None Help from another person taking care of personal grooming?: A Little Help from another person toileting, which includes using toliet, bedpan, or urinal?: A Little Help from another person bathing (including washing, rinsing, drying)?: A Little Help from another person to put on and taking off regular upper body clothing?: A Little Help from another person to put on and taking off regular lower body clothing?: A Little 6 Click Score: 19   End of Session Equipment Utilized During Treatment: Gait belt;Rolling walker Nurse Communication: Mobility status  Activity Tolerance: Patient tolerated treatment well Patient left: in bed;with call bell/phone within reach  OT Visit Diagnosis: Unsteadiness on feet (R26.81)                Time: 4967-5916 OT Time Calculation (min): 43 min Charges:  OT General Charges $OT Visit: 1 Visit OT Evaluation $OT Eval Moderate Complexity: 1 Mod OT Treatments $Self Care/Home Management :  23-37 mins [place], OTR/L [phone] 08/06/2017

## 2017-08-06 NOTE — Evaluation (Signed)
Physical Therapy Evaluation Patient Details Name: Frank [name] MRN: [id number] DOB: May 27, 1941 Today's Date: 08/06/2017   History of Present Illness  77 year old gentleman with PMH of DM, HTN, hyperlipidemia who was admitted to the hospital about 6 weeks ago with a fall and small and parenchymal contusion.  Returned to ED due to dizziness, imbalance and confusion.  Follow-up CT scan showed bilateral subacute subdural hematomas.  Now s/p bilateral bur holes.  Clinical Impression  Patient presents with decreased independence with mobility due to decreased balance, decreased safety with fear of falling and decreased LE strength/coordination.  He will benefit from skilled PT in the acute setting to allow d/c home with family support and to resume HHPT.  Currently min A level for mobility, was S with walker at home.    Follow Up Recommendations Home health PT;Supervision/Assistance - 24 hour    Equipment Recommendations  None recommended by PT    Recommendations for Other Services       Precautions / Restrictions Precautions Precautions: Fall Precaution Comments: reports fear of falling again      Mobility  Bed Mobility Overal bed mobility: Needs Assistance Bed Mobility: Supine to Sit     Supine to sit: HOB elevated;Min assist     General bed mobility comments: cues and assist to scoot forward and lift trunk  Transfers Overall transfer level: Needs assistance Equipment used: Rolling walker (2 wheeled) Transfers: Sit to/from Stand Sit to Stand: Min assist         General transfer comment: cues for hand placement for transfer from bed and toilet  Ambulation/Gait Ambulation/Gait assistance: Min guard;Supervision Ambulation Distance (Feet): 150 Feet Assistive device: Rolling walker (2 wheeled) Gait Pattern/deviations: Step-to pattern;Narrow base of support;Shuffle;Decreased stride length     General Gait Details: cues for proximity to walker and for step length,  short shuffling steps and slow pace (pt with reported fear of falling and states was unable to coordinate using walker at rehab so used rollator)  Stairs            Wheelchair Mobility    Modified Rankin (Stroke Patients Only)       Balance Overall balance assessment: Needs assistance Sitting-balance support: Feet supported Sitting balance-Leahy Scale: Fair     Standing balance support: Bilateral upper extremity supported Standing balance-Leahy Scale: Poor Standing balance comment: UE support in standing                             Pertinent Vitals/Pain Pain Assessment: 0-10 Pain Score: 3  Pain Location: head Pain Descriptors / Indicators: Headache Pain Intervention(s): Monitored during session    Home Living Family/patient expects to be discharged to:: Private residence Living Arrangements: Spouse/significant other Available Help at Discharge: Family;Available 24 hours/day Type of Home: House Home Access: Stairs to enter Entrance Stairs-Rails: Can reach both Entrance Stairs-Number of Steps: 4 Home Layout: One level Home Equipment: Grab bars - tub/shower;Grab bars - toilet;Walker - 4 wheels;Shower seat      Prior Function Level of Independence: Independent with assistive device(s)         Comments: I love to drive, but they told me I can't yet     Hand Dominance   Dominant Hand: Right    Extremity/Trunk Assessment   Upper Extremity Assessment Upper Extremity Assessment: Defer to OT evaluation    Lower Extremity Assessment Lower Extremity Assessment: Generalized weakness    Cervical / Trunk Assessment Cervical / Trunk Assessment: Kyphotic  Communication   Communication: No difficulties  Cognition Arousal/Alertness: Awake/alert Behavior During Therapy: WFL for tasks assessed/performed Overall Cognitive Status: Within Functional Limits for tasks assessed                                        General Comments  General comments (skin integrity, edema, etc.): son in room, reports 4 falls since d/c from CIR; all but one got up with "cheerleader" but states he helped him up after one fall.    Exercises     Assessment/Plan    PT Assessment Patient needs continued PT services  PT Problem List Decreased strength;Decreased balance;Decreased knowledge of use of DME;Decreased knowledge of precautions;Decreased mobility;Decreased safety awareness       PT Treatment Interventions Gait training;DME instruction;Functional mobility training;Balance training;Patient/family education;Therapeutic activities;Stair training;Therapeutic exercise    PT Goals (Current goals can be found in the Care Plan section)  Acute Rehab PT Goals Patient Stated Goal: To go home PT Goal Formulation: With patient/family Time For Goal Achievement: 08/13/17 Potential to Achieve Goals: Good    Frequency Min 3X/week   Barriers to discharge        Co-evaluation               AM-PAC PT "6 Clicks" Daily Activity  Outcome Measure Difficulty turning over in bed (including adjusting bedclothes, sheets and blankets)?: A Little Difficulty moving from lying on back to sitting on the side of the bed? : Unable Difficulty sitting down on and standing up from a chair with arms (e.g., wheelchair, bedside commode, etc,.)?: Unable Help needed moving to and from a bed to chair (including a wheelchair)?: A Little Help needed walking in hospital room?: A Little Help needed climbing 3-5 steps with a railing? : A Little 6 Click Score: 14    End of Session Equipment Utilized During Treatment: Gait belt Activity Tolerance: Patient tolerated treatment well Patient left: in chair;with family/visitor present Nurse Communication: Mobility status PT Visit Diagnosis: Other abnormalities of gait and mobility (R26.89);History of falling (Z91.81)    Time: 3818-2993 PT Time Calculation (min) (ACUTE ONLY): 26 min   Charges:   PT  Evaluation $PT Eval Moderate Complexity: 1 Mod PT Treatments $Gait Training: 8-22 mins   PT G CodesMagda Kiel, [place] [phone] 08/06/2017   Reginia Naas 08/06/2017, 11:26 AM

## 2017-08-06 NOTE — Care Management Note (Signed)
Case Management Note  Patient Details  Name: Frank [name] MRN: [id number] Date of Birth: 25-Oct-1940  Subjective/Objective: Pt admitted on 08/04/17 with bilateral SDH, requiring [facility] for evacuation.  PTA, pt resided at home with spouse; he is active with [facility] for HHPT/OT and HHA, provided through [employer]  Lulu Riding, RN, is following WC case Freight forwarder. Phone: [phone].   Action/Plan: Met with pt, family and WC case manager to discuss home arrangements.  PT/OT recommending HH follow up at dc as prior to admission.  Wife has been receiving HHA 6h/day, 5 days/ week, but has been struggling at home caring for pt.  WC Case manager states she can approve HHA daily for 12h/day (wife's preference) and continuation of HHPT/OT.  Pt/wife happy with this plan.  Will electronically fax clinical information and [place] orders to Ms. Baker, per her request.  WC case manager did state that because there are no insurance constraints or authorizations needed with this case, pt could stay in house through the weekend, if needed.      Expected Discharge Date:                  Expected Discharge Plan:  [place]  In-House Referral:     Discharge planning Services  CM Consult  Post Acute Care Choice:    Choice offered to:  Patient, Spouse  DME Arranged:    DME Agency:     HH Arranged:  PT, OT, Nurse's Aide [place] Agency:  Other - See comment  Status of Service:  Completed, signed off  If discussed at [place] of Stay Meetings, dates discussed:    Additional Comments:  Reinaldo Raddle, RN, BSN  Trauma/Neuro ICU Case Manager [phone]

## 2017-08-07 LAB — GLUCOSE, CAPILLARY
Glucose-Capillary: 202 mg/dL — ABNORMAL HIGH (ref 65–99)
Glucose-Capillary: 211 mg/dL — ABNORMAL HIGH (ref 65–99)

## 2017-08-07 MED ORDER — LEVETIRACETAM 500 MG PO TABS: 500.0000 mg | ORAL_TABLET | Freq: Two times a day (BID) | ORAL | 0 refills | Status: DC

## 2017-08-07 NOTE — Progress Notes (Addendum)
Occupational Therapy Treatment Patient Details Name: Frank [name] MRN: [id number] DOB: 25-Mar-1941 Today's Date: 08/07/2017    History of present illness 77 year old gentleman with PMH of DM, HTN, hyperlipidemia who was admitted to the hospital about 6 weeks ago with a fall and small and parenchymal contusion.  Returned to ED due to dizziness, imbalance and confusion.  Follow-up CT scan showed bilateral subacute subdural hematomas.  Now s/p bilateral bur holes.   OT comments  Pt continues to require min guard assist for functional mobility due to balance deficits and difficulty staying inside of RW, seems to want to veer L and walk on R side of RW. HR up to 122 with mobility; not sustained and pt asymptomatic. Pt able to complete LB dressing and grooming task in standing with min guard assist today. D/c plan remains appropriate. Will continue to follow acutley.   Follow Up Recommendations  Home health OT;Supervision/Assistance - 24 hour    Equipment Recommendations  None recommended by OT    Recommendations for Other Services      Precautions / Restrictions Precautions Precautions: Fall Restrictions Weight Bearing Restrictions: No       Mobility Bed Mobility               General bed mobility comments: Pt OOB in chair upon arrival  Transfers Overall transfer level: Needs assistance Equipment used: Rolling walker (2 wheeled) Transfers: Sit to/from Stand Sit to Stand: Min guard         General transfer comment: Min guard for safety.    Balance Overall balance assessment: Needs assistance Sitting-balance support: Feet supported;No upper extremity supported Sitting balance-Leahy Scale: Good     Standing balance support: No upper extremity supported;During functional activity Standing balance-Leahy Scale: Fair Standing balance comment: for static standing                           ADL either performed or assessed with clinical judgement    ADL Overall ADL's : Needs assistance/impaired     Grooming: Min guard;Standing;Wash/dry hands               Lower Body Dressing: Min guard;Sit to/from stand   Toilet Transfer: Ambulation;RW;Min Psychiatric nurse Details (indicate cue type and reason): Simulated. Pt walking to R side of RW; cues to maintain midline when walking         Functional mobility during ADLs: Min guard;Rolling walker       Vision       Perception     Praxis      Cognition Arousal/Alertness: Awake/alert Behavior During Therapy: WFL for tasks assessed/performed Overall Cognitive Status: Within Functional Limits for tasks assessed                                          Exercises     Shoulder Instructions       General Comments      Pertinent Vitals/ Pain       Pain Assessment: No/denies pain  Home Living                                          Prior Functioning/Environment              Frequency  Min  2X/week        Progress Toward Goals  OT Goals(current goals can now be found in the care plan section)  Progress towards OT goals: Progressing toward goals  Acute Rehab OT Goals Patient Stated Goal: To go home OT Goal Formulation: With patient  Plan Discharge plan remains appropriate    Co-evaluation                 AM-PAC PT "6 Clicks" Daily Activity     Outcome Measure   Help from another person eating meals?: None Help from another person taking care of personal grooming?: A Little Help from another person toileting, which includes using toliet, bedpan, or urinal?: A Little Help from another person bathing (including washing, rinsing, drying)?: A Little Help from another person to put on and taking off regular upper body clothing?: A Little Help from another person to put on and taking off regular lower body clothing?: A Little 6 Click Score: 19    End of Session Equipment Utilized During Treatment:  Rolling walker  OT Visit Diagnosis: Unsteadiness on feet (R26.81)   Activity Tolerance Patient tolerated treatment well   Patient Left in chair;with call bell/phone within reach;with chair alarm set;with nursing/sitter in room   Nurse Communication Mobility status        Time: 4332-9518 OT Time Calculation (min): 16 min  Charges: OT General Charges $OT Visit: 1 Visit OT Treatments $Self Care/Home Management : 8-22 mins  Tinna Kolker A. Ulice Brilliant, M.S., OTR/L Pager: [place] 08/07/2017, 11:57 AM

## 2017-08-07 NOTE — Progress Notes (Signed)
Physical Therapy Treatment Patient Details Name: Frank [name] MRN: [id number] DOB: June 04, 1941 Today's Date: 08/07/2017    History of Present Illness 77 year old gentleman with PMH of DM, HTN, hyperlipidemia who was admitted to the hospital about 6 weeks ago with a fall and small and parenchymal contusion.  Returned to ED due to dizziness, imbalance and confusion.  Follow-up CT scan showed bilateral subacute subdural hematomas.  Now s/p bilateral bur holes.    PT Comments    Pt ambulating and stair training with min to min guard assist.  I believe he will do better with his usual AD (rollator) and stairs with rails he can reach both (we did one rail and hand held assist today).  He is safe to return home from a mobility standpoint as he has supportive family who can provide the physical assist and supervision he needs at this time to be safe. PT to follow acutely until d/c confirmed.     Follow Up Recommendations  Home health PT;Supervision/Assistance - 24 hour     Equipment Recommendations  None recommended by PT    Recommendations for Other Services   NA     Precautions / Restrictions Precautions Precautions: Fall Restrictions Weight Bearing Restrictions: No    Mobility  Bed Mobility               General bed mobility comments: Pt was OOB in the recliner chair.   Transfers Overall transfer level: Needs assistance Equipment used: Rolling walker (2 wheeled) Transfers: Sit to/from Stand Sit to Stand: Min guard         General transfer comment: Min guard assist for safety during transitions.   Ambulation/Gait Ambulation/Gait assistance: Min guard;Supervision Ambulation Distance (Feet): 150 Feet Assistive device: Rolling walker (2 wheeled) Gait Pattern/deviations: Step-through pattern;Trunk flexed;Shuffle Gait velocity: decreased Gait velocity interpretation: Below normal speed for age/gender General Gait Details: Pt with slow shuffling/choppy gait  pattern, improve with increased distance.  Pt also reporting that he usually uses a rollator which has a bit more swivel and would be easier for him to steer and maneuver around obstacles.    Stairs Stairs: Yes   Stair Management: One rail Right;Step to pattern;Forwards(hand held assist left. ) Number of Stairs: 4 General stair comments: Min hand held assist as therapist simulated his rail on th opposite side that he could reach at home.  I bet he could be as good as supervision if he could reach both rails.  Verbal cues to go slow and one step at a time.          Balance Overall balance assessment: Needs assistance Sitting-balance support: Feet supported;No upper extremity supported Sitting balance-Leahy Scale: Good     Standing balance support: No upper extremity supported;Bilateral upper extremity supported;Single extremity supported Standing balance-Leahy Scale: Fair Standing balance comment: for static standing                            Cognition Arousal/Alertness: Awake/alert Behavior During Therapy: WFL for tasks assessed/performed Overall Cognitive Status: Within Functional Limits for tasks assessed(not specifically tested, but conversation normal)                                               Pertinent Vitals/Pain Pain Assessment: No/denies pain           PT Goals (  current goals can now be found in the care plan section) Acute Rehab PT Goals Patient Stated Goal: To go home Progress towards PT goals: Progressing toward goals    Frequency    Min 3X/week      PT Plan Current plan remains appropriate       AM-PAC PT "6 Clicks" Daily Activity  Outcome Measure  Difficulty turning over in bed (including adjusting bedclothes, sheets and blankets)?: A Little Difficulty moving from lying on back to sitting on the side of the bed? : Unable Difficulty sitting down on and standing up from a chair with arms (e.g., wheelchair,  bedside commode, etc,.)?: Unable Help needed moving to and from a bed to chair (including a wheelchair)?: A Little Help needed walking in hospital room?: A Little Help needed climbing 3-5 steps with a railing? : A Little 6 Click Score: 14    End of Session Equipment Utilized During Treatment: Gait belt Activity Tolerance: Patient tolerated treatment well Patient left: in chair;with family/visitor present Nurse Communication: Mobility status PT Visit Diagnosis: Other abnormalities of gait and mobility (R26.89);History of falling (Z91.81)     Time: 1213-1227 PT Time Calculation (min) (ACUTE ONLY): 14 min  Charges:  $Gait Training: 8-22 mins                    Robyne Matar B. [place], [place], DPT [phone]    08/07/2017, 2:09 PM

## 2017-08-07 NOTE — Progress Notes (Signed)
Patient ID: Frank [name], male   DOB: Sep 24, 1940, 77 y.o.   MRN: [id number] Doing well, no headache, moves very well, awake/ alert, d/c home when safe (PT/OT today, HH being arranged)

## 2017-08-07 NOTE — Discharge Summary (Signed)
Physician Discharge Summary  Patient ID: Frank [name] MRN: [id number] DOB/AGE: 12/26/1940 77 y.o.  Admit date: 08/04/2017 Discharge date: 08/07/2017  Admission Diagnoses:  SDH  Discharge Diagnoses:  Same Active Problems:   Subdural hematoma ([name])   SDH (subdural hematoma) [facility])   Discharged Condition: Stable  Hospital Course:  Frank [name] is a 77 y.o. male who presented to ER 08/04/17 due to progressive dizziness, confusion and HA. 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 Underwent surgery as below. There were no post operative complications. At time of discharge, pain was well controlled, ambulating with Pt/OT, tolerating po, voiding normal. Ready for discharge.  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 rate 18, height 5\' 10"  (1.778 m), weight 90.4 kg (199 lb 4.7 oz), SpO2 93 %. Awake, alert, oriented Speech fluent, appropriate CN grossly intact 5/5 BUE/BLE Wound c/d/i  Disposition: 06-Home-Health Care Svc  Discharge Instructions    Call MD for:  difficulty breathing, headache or visual disturbances   Complete by:  As directed    Call MD for:  persistant dizziness or light-headedness   Complete by:  As directed    Call MD for:  redness, tenderness, or signs of infection (pain, swelling, redness, odor or green/yellow discharge around incision site)   Complete by:  As directed    Call MD for:  severe uncontrolled pain   Complete by:  As directed    Call MD for:  temperature >100.4   Complete by:  As directed    Diet general   Complete by:  As directed    Driving Restrictions   Complete by:  As directed    Do not drive until given clearance.   Increase activity slowly   Complete by:  As directed    Lifting restrictions   Complete by:  As directed    Do  not lift anything >10lbs. Avoid bending and twisting in awkward positions. Avoid bending at the back.   May shower / Bathe   Complete by:  As directed    In 24 hours. Okay to wash wound with warm soapy water. Avoid scrubbing the wound. Pat dry.   Remove dressing in 24 hours   Complete by:  As directed      Allergies as of 08/07/2017      Reactions   Lisinopril Cough   Penicillins    REACTION: unspecified Allergy as a child.  Patient does not remember reaction.  Has had cephalasporins without problems.   Sulfamethoxazole    REACTION: as child      Medication List    TAKE these medications   acetaminophen 500 MG tablet Commonly known as:  TYLENOL Take 500 mg by mouth every 6 (six) hours as needed for mild pain.   amLODipine 10 MG tablet Commonly known as:  NORVASC Take 10 mg by mouth daily.   atorvastatin 20 MG tablet Commonly known as:  LIPITOR Take 0.5 tablets (10 mg total) by mouth daily.   Carbonyl Iron 25 MG Tabs Take 25 mg by mouth daily.   cetirizine 10 MG tablet Commonly known as:  ZYRTEC Take 10 mg by mouth at bedtime.   FLUoxetine 20 MG capsule Commonly known as:  PROZAC Take 1 capsule (20 mg total) by mouth daily.   fluticasone 50 MCG/ACT nasal spray Commonly known as:  [place]  1 spray into both nostrils daily as needed for allergies or rhinitis.   glipiZIDE 10 MG 24 hr tablet Commonly known as:  GLUCOTROL XL Take 1 tablet (10 mg total) by mouth daily.   levETIRAcetam 500 MG tablet Commonly known as:  KEPPRA Take 1 tablet (500 mg total) by mouth 2 (two) times daily.   linagliptin 5 MG Tabs tablet Commonly known as:  TRADJENTA Take 1 tablet (5 mg total) by mouth daily.   oxybutynin 5 MG tablet Commonly known as:  DITROPAN Take 1 tablet (5 mg total) by mouth 2 (two) times daily.   pantoprazole 40 MG tablet Commonly known as:  PROTONIX Take 1 tablet (40 mg total) by mouth daily.   telmisartan 40 MG tablet Commonly known as:   MICARDIS Take 1 tablet (40 mg total) by mouth daily.      Follow-up Information    [name], MD Follow up.   Specialty:  Neurosurgery Contact information: [address] [place] [address] [phone]           Signed: Traci Sermon 08/07/2017, 1:03 PM

## 2017-08-09 ENCOUNTER — Telehealth: Payer: Self-pay | Admitting: *Deleted

## 2017-08-09 NOTE — Telephone Encounter (Signed)
Pt was on TCM list admitted 08/04/17 for SDH (subdural hematoma). 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 Pt D/C 08/07/17, and will follow-up w/specialist Dr. [name].Marland KitchenJohny Chess

## 2017-08-10 ENCOUNTER — Other Ambulatory Visit: Payer: Self-pay | Admitting: Internal Medicine

## 2017-08-16 ENCOUNTER — Encounter (HOSPITAL_COMMUNITY): Payer: Self-pay | Admitting: Neurosurgery

## 2017-08-19 ENCOUNTER — Encounter: Payer: Self-pay | Admitting: Internal Medicine

## 2017-08-19 ENCOUNTER — Ambulatory Visit (INDEPENDENT_AMBULATORY_CARE_PROVIDER_SITE_OTHER): Payer: Medicare Other | Admitting: Internal Medicine

## 2017-08-19 VITALS — BP 120/60 | HR 80 | Temp 99.4°F | Resp 16 | Ht 70.0 in | Wt 200.1 lb

## 2017-08-19 DIAGNOSIS — N138 Other obstructive and reflux uropathy: Secondary | ICD-10-CM

## 2017-08-19 DIAGNOSIS — I10 Essential (primary) hypertension: Secondary | ICD-10-CM

## 2017-08-19 DIAGNOSIS — E1142 Type 2 diabetes mellitus with diabetic polyneuropathy: Secondary | ICD-10-CM

## 2017-08-19 DIAGNOSIS — N401 Enlarged prostate with lower urinary tract symptoms: Secondary | ICD-10-CM

## 2017-08-19 DIAGNOSIS — D510 Vitamin B12 deficiency anemia due to intrinsic factor deficiency: Secondary | ICD-10-CM | POA: Diagnosis not present

## 2017-08-19 DIAGNOSIS — S062X0S Diffuse traumatic brain injury without loss of consciousness, sequela: Secondary | ICD-10-CM

## 2017-08-19 MED ORDER — DOXAZOSIN MESYLATE 2 MG PO TABS: 2.0000 mg | ORAL_TABLET | Freq: Every day | ORAL | 1 refills | Status: DC

## 2017-08-19 MED ORDER — CYANOCOBALAMIN 1000 MCG/ML IJ SOLN
1000.0000 ug | Freq: Once | INTRAMUSCULAR | Status: AC
  Administered 2017-08-19: 1000 ug via INTRAMUSCULAR

## 2017-08-19 NOTE — Progress Notes (Signed)
Subjective:  Patient ID: Frank [name], male    DOB: 1941-06-16  Age: 77 y.o. MRN: [id number]  CC: Anemia   HPI Frank [name] presents for f/up - He was recently admitted for bilateral subdural hematomas that required bur hole craniotomy for evacuation.  He is markedly improved.  He has no more headaches and denies ataxia.  He was found to be anemic and tells me he is taking an iron pill but is not getting a vitamin B12 supplement.  He complains of difficulty urinating with urinary hesitation and frequent nocturia.  For some reason, he tells me that doxazosin was discontinued but he wants to restart it.  Outpatient Medications Prior to Visit  Medication Sig Dispense Refill  . acetaminophen (TYLENOL) 500 MG tablet Take 500 mg by mouth every 6 (six) hours as needed for mild pain.    [name] amLODipine (NORVASC) 10 MG tablet Take 10 mg by mouth daily.    [name] atorvastatin (LIPITOR) 20 MG tablet Take 0.5 tablets (10 mg total) by mouth daily. 90 tablet 3  . Carbonyl Iron 25 MG TABS Take 25 mg by mouth daily.    . cetirizine (ZYRTEC) 10 MG tablet Take 10 mg by mouth at bedtime.     [name] FLUoxetine (PROZAC) 20 MG capsule Take 1 capsule (20 mg total) by mouth daily. 90 capsule 1  . fluticasone (FLONASE) 50 MCG/ACT nasal spray Place 1 spray into both nostrils daily as needed for allergies or rhinitis.    [name] glipiZIDE (GLUCOTROL XL) 10 MG 24 hr tablet Take 1 tablet (10 mg total) by mouth daily. 90 tablet 1  . levETIRAcetam (KEPPRA) 500 MG tablet Take 1 tablet (500 mg total) by mouth 2 (two) times daily. 30 tablet 0  . oxybutynin (DITROPAN) 5 MG tablet Take 1 tablet (5 mg total) by mouth 2 (two) times daily. 60 tablet 0  . pantoprazole (PROTONIX) 40 MG tablet Take 1 tablet (40 mg total) by mouth daily. 90 tablet 1  . telmisartan (MICARDIS) 40 MG tablet Take 1 tablet (40 mg total) by mouth daily. 90 tablet 1  . linagliptin (TRADJENTA) 5 MG TABS tablet Take 1 tablet (5 mg total) by mouth daily.  (Patient not taking: Reported on 08/19/2017) 90 tablet 1   No facility-administered medications prior to visit.     ROS Review of Systems  Constitutional: Negative for chills, diaphoresis, fatigue and unexpected weight change.  HENT: Negative.   Eyes: Negative for photophobia and visual disturbance.  Respiratory: Negative.  Negative for cough, chest tightness, shortness of breath and wheezing.   Cardiovascular: Negative.  Negative for chest pain, palpitations and leg swelling.  Gastrointestinal: Negative for abdominal pain, constipation, diarrhea, nausea and vomiting.  Endocrine: Negative.   Genitourinary: Positive for difficulty urinating. Negative for decreased urine volume, dysuria, flank pain, hematuria and urgency.  Musculoskeletal: Negative.  Negative for back pain, neck pain and neck stiffness.  Skin: Negative.  Negative for color change.  Allergic/Immunologic: Negative.   Neurological: Negative.  Negative for dizziness, weakness, light-headedness, numbness and headaches.  Hematological: Negative for adenopathy. Does not bruise/bleed easily.  Psychiatric/Behavioral: Negative.  Negative for dysphoric mood, sleep disturbance and suicidal ideas. The patient is not nervous/anxious.     Objective:  BP 120/60 (BP Location: Right Arm, Patient Position: Sitting, Cuff Size: Normal)   Pulse 80   Temp 99.4 F (37.4 C) (Oral)   Resp 16   Ht 5\' 10"  (1.778 m)   Wt 200 lb 1.3 oz (90.8 kg)  SpO2 94%   BMI 28.71 kg/m   BP Readings from Last 3 Encounters:  08/19/17 120/60  08/07/17 (!) 112/59  07/27/17 130/80    Wt Readings from Last 3 Encounters:  08/19/17 200 lb 1.3 oz (90.8 kg)  08/06/17 199 lb 4.7 oz (90.4 kg)  07/20/17 207 lb (93.9 kg)    Physical Exam  Constitutional: He is oriented to person, place, and time. No distress.  HENT:  Head:    Mouth/Throat: Oropharynx is clear and moist. No oropharyngeal exudate.  Eyes: Conjunctivae are normal. Left eye exhibits no  discharge. No scleral icterus.  Neck: Normal range of motion. Neck supple. No JVD present. No thyromegaly present.  Cardiovascular: Normal rate, regular rhythm and normal heart sounds. Exam reveals no gallop.  No murmur heard. Pulmonary/Chest: Effort normal and breath sounds normal. No respiratory distress. He has no wheezes. He has no rales.  Abdominal: Soft. Bowel sounds are normal. He exhibits no distension and no mass. There is no tenderness.  Musculoskeletal: Normal range of motion. He exhibits no edema, tenderness or deformity.  Lymphadenopathy:    He has no cervical adenopathy.  Neurological: He is alert and oriented to person, place, and time. He has normal reflexes. He displays normal reflexes. He exhibits normal muscle tone. Coordination normal.  Skin: Skin is warm and dry. No rash noted. He is not diaphoretic. No erythema. No pallor.  Vitals reviewed.   Lab Results  Component Value Date   WBC 8.6 08/05/2017   HGB 10.8 (L) 08/05/2017   HCT 33.5 (L) 08/05/2017   PLT 174 08/05/2017   GLUCOSE 164 (H) 08/05/2017   CHOL 102 06/26/2017   TRIG 88 06/26/2017   HDL 42 06/26/2017   LDLCALC 42 06/26/2017   ALT 19 08/04/2017   AST 20 08/04/2017   NA 138 08/05/2017   K 3.9 08/05/2017   CL 104 08/05/2017   CREATININE 1.07 08/05/2017   BUN 27 (H) 08/05/2017   CO2 25 08/05/2017   TSH 0.89 06/16/2016   PSA 1.08 07/10/2015   INR 0.98 08/04/2017   HGBA1C 7.2 07/20/2017   MICROALBUR 26.0 (H) 02/01/2017    Ct Head Wo Contrast  Result Date: 08/05/2017 CLINICAL DATA:  Follow-up examination for subdural hemorrhage. EXAM: CT HEAD WITHOUT CONTRAST TECHNIQUE: Contiguous axial images were obtained from the base of the skull through the vertex without intravenous contrast. COMPARISON:  Prior CT from 08/04/2017. FINDINGS: Brain: Postoperative changes from interval bilateral burr hole placement for subdural evacuation is seen. Two burr holes have been performed on the left with a single burr  hole on the right. Postoperative pneumocephalus present within the extra-axial space bilaterally. There has been partial evaluation of the previously seen bilateral subdural collections, overall decreased in size from previous. Collection on the left now measures up to 12 mm at the level of the left frontal convexity, previously 16 mm. Collection on the right also slightly decreased measuring up to 10 mm in maximal thickness at the level of the right parietal convexity. Small amount of acute blood products seen layering posteriorly within these collections. Persistent but improved left-to-right shift measuring 6 mm. No hydrocephalus or ventricular trapping. Basilar cisterns remain patent. No other acute intracranial abnormality. Small remote lacunar infarct noted at the right caudate. No acute large vessel territory infarct. No other acute intracranial hemorrhage. No mass lesion. Vascular: No hyperdense vessel. Scattered vascular calcifications noted within the carotid siphons. Skull: Skin staples overlie the bilateral burr hole craniotomies. Associated soft tissue swelling with emphysema.  Sinuses/Orbits: Globes and orbital soft tissues within normal limits. Patient status post lens extraction bilaterally. Metallic foreign body just superior to the left globe again noted, unchanged. Scattered ethmoidal, right sphenoid, and right maxillary sinus mucosal thickening, likely chronic. Visualized mastoid air cells are clear. Other: None. IMPRESSION: 1. Postoperative changes from interval bilateral burr hole craniotomy for subdural evacuation. No complication. 2. Bilateral subdural collections overall decreased in size as compared to previous. Persistent but improved 6 mm of left-to-right midline shift. No hydrocephalus or ventricular trapping. 3. No other new acute intracranial abnormality. Electronically Signed   By: Jeannine Boga M.D.   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 Weakness EXAM: CT HEAD WITHOUT CONTRAST TECHNIQUE: Contiguous axial images were obtained from the base of the skull through the vertex without intravenous contrast. COMPARISON:  06/23/2017, 03/14/2017, MRI 06/24/2017 FINDINGS: Brain: New bilateral supratentorial extra-axial fluid collections, compatible with subdural hematoma. Intermediate density within the fluid bilaterally with no evidence of acute blood products. Linear differential density overlying the right frontal region, within the subdural collection may represent vasculature or subacute blood products. The larger is the left side, which extends from the anterior cranial fossa overlying the left frontal lobe to the posterior, overlying the parietal lobe. Greatest thickness measured on the coronal reformatted images approximately 17 mm in thickness. There is associated mass effect on the left hemisphere with left to right midline shift measured at the foramen of Monro approximately 8 mm. Associated compression of the left lateral ventricle. The right subdural collection extends from the frontal region over the hemisphere to the parietal region. Greatest thickness measured on coronal reformatted images of approximately 12 mm in thickness. There is associated compression on the underlying right hemisphere. Gray-white differentiation maintained. 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 Vascular: Calcifications of the anterior circulation. Skull: No acute bony abnormality or aggressive bone lesion. Sinuses/Orbits: Bilateral proptosis, with similar configuration to the prior. No mastoid effusion. Scattered ethmoid air cell opacification. Other: None IMPRESSION: New bilateral cerebral convexity intermediate density subdural hematoma, with associated  mass effect. Greatest thickness of the left is 17 mm and the right is 12 mm, with net mass effect resulting in left to right midline shift at foramen of Monro of approximately 8 mm. Linear hyperdensity in the anterior right fluid collection may represent vasculature or small subacute blood products. Focal hypodensity in the posterior right parietal region, corresponds to evolving encephalomalacia after prior focal hemorrhage. These results were called by telephone at the time of interpretation on 08/04/2017 at 7:11 pm to Dr. Nanda Quinton , who verbally acknowledged these results. Electronically Signed   By: Corrie Mckusick D.O.   On: 08/04/2017 19:11    Assessment & Plan:   Alfredo was seen today for anemia.  Diagnoses and all orders for this visit:  Benign prostatic hyperplasia with urinary obstruction- Will restart the peripheral alpha blocker to alleviate his urinary symptoms. -     doxazosin (CARDURA) 2 MG tablet; Take 1 tablet (2 mg total) by mouth daily. -     Urinalysis, Routine w reflex microscopic; Future  Vitamin B12 deficiency anemia due to intrinsic factor deficiency- His recent H&H were down to 10.8 and 33.5.  Will restart B12 replacement therapy. -     CBC with Differential/Platelet; Future -     cyanocobalamin ((  VITAMIN B-12)) injection 1,000 mcg  Type 2 diabetes mellitus with peripheral neuropathy ([place])- Will check for microalbuminuria today.  His blood sugars have recently been adequately well controlled. -     Microalbumin / creatinine urine ratio; Future  Essential hypertension- His blood pressure is adequately well controlled. -     CBC with Differential/Platelet; Future   I have discontinued [name] Giza's linagliptin. I am also having him start on doxazosin. Additionally, I am having him maintain his cetirizine, atorvastatin, FLUoxetine, glipiZIDE, oxybutynin, telmisartan, pantoprazole, amLODipine, acetaminophen, Carbonyl Iron, fluticasone, and levETIRAcetam. We  administered cyanocobalamin.  Meds ordered this encounter  Medications  . doxazosin (CARDURA) 2 MG tablet    Sig: Take 1 tablet (2 mg total) by mouth daily.    Dispense:  90 tablet    Refill:  1  . cyanocobalamin ((VITAMIN B-12)) injection 1,000 mcg     Follow-up: Return in about 3 months (around 11/16/2017).  Scarlette Calico, MD

## 2017-08-19 NOTE — Patient Instructions (Signed)
Anemia Anemia is a condition in which you do not have enough red blood cells or hemoglobin. Hemoglobin is a substance in red blood cells that carries oxygen. When you do not have enough red blood cells or hemoglobin (are anemic), your body cannot get enough oxygen and your organs may not work properly. As a result, you may feel very tired or have other problems. What are the causes? Common causes of anemia include:  Excessive bleeding. Anemia can be caused by excessive bleeding inside or outside the body, including bleeding from the intestine or from periods in women.  Poor nutrition.  Long-lasting (chronic) kidney, thyroid, and liver disease.  Bone marrow disorders.  Cancer and treatments for cancer.  HIV (human immunodeficiency virus) and AIDS (acquired immunodeficiency syndrome).  Treatments for HIV and AIDS.  Spleen problems.  Blood disorders.  Infections, medicines, and autoimmune disorders that destroy red blood cells.  What are the signs or symptoms? Symptoms of this condition include:  Minor weakness.  Dizziness.  Headache.  Feeling heartbeats that are irregular or faster than normal (palpitations).  Shortness of breath, especially with exercise.  Paleness.  Cold sensitivity.  Indigestion.  Nausea.  Difficulty sleeping.  Difficulty concentrating.  Symptoms may occur suddenly or develop slowly. If your anemia is mild, you may not have symptoms. How is this diagnosed? This condition is diagnosed based on:  Blood tests.  Your medical history.  A physical exam.  Bone marrow biopsy.  Your health care provider may also check your stool (feces) for blood and may do additional testing to look for the cause of your bleeding. You may also have other tests, including:  Imaging tests, such as a CT scan or MRI.  Endoscopy.  Colonoscopy.  How is this treated? Treatment for this condition depends on the cause. If you continue to lose a lot of blood,  you may need to be treated at a hospital. Treatment may include:  Taking supplements of iron, vitamin B12, or folic acid.  Taking a hormone medicine (erythropoietin) that can help to stimulate red blood cell growth.  Having a blood transfusion. This may be needed if you lose a lot of blood.  Making changes to your diet.  Having surgery to remove your spleen.  Follow these instructions at home:  Take over-the-counter and prescription medicines only as told by your health care provider.  Take supplements only as told by your health care provider.  Follow any diet instructions that you were given.  Keep all follow-up visits as told by your health care provider. This is important. Contact a health care provider if:  You develop new bleeding anywhere in the body. Get help right away if:  You are very weak.  You are short of breath.  You have pain in your abdomen or chest.  You are dizzy or feel faint.  You have trouble concentrating.  You have bloody or black, tarry stools.  You vomit repeatedly or you vomit up blood. Summary  Anemia is a condition in which you do not have enough red blood cells or enough of a substance in your red blood cells that carries oxygen (hemoglobin).  Symptoms may occur suddenly or develop slowly.  If your anemia is mild, you may not have symptoms.  This condition is diagnosed with blood tests as well as a medical history and physical exam. Other tests may be needed.  Treatment for this condition depends on the cause of the anemia. This information is not intended to replace advice   given to you by your health care provider. Make sure you discuss any questions you have with your health care provider. Document Released: 08/06/2004 Document Revised: 07/31/2016 Document Reviewed: 07/31/2016 Elsevier Interactive Patient Education  [employer].

## 2017-08-22 ENCOUNTER — Other Ambulatory Visit: Payer: Self-pay | Admitting: Neurosurgery

## 2017-08-22 DIAGNOSIS — S065X9A Traumatic subdural hemorrhage with loss of consciousness of unspecified duration, initial encounter: Secondary | ICD-10-CM

## 2017-08-22 DIAGNOSIS — S065XAA Traumatic subdural hemorrhage with loss of consciousness status unknown, initial encounter: Secondary | ICD-10-CM

## 2017-08-22 NOTE — Assessment & Plan Note (Signed)
He is improving nicely No complications noted We will continue to follow-up with neurosurgery

## 2017-08-24 ENCOUNTER — Ambulatory Visit: Payer: Worker's Compensation | Admitting: Physical Medicine & Rehabilitation

## 2017-08-24 ENCOUNTER — Ambulatory Visit: Payer: Self-pay

## 2017-08-25 DIAGNOSIS — G4733 Obstructive sleep apnea (adult) (pediatric): Secondary | ICD-10-CM | POA: Diagnosis not present

## 2017-09-10 ENCOUNTER — Other Ambulatory Visit: Payer: Self-pay | Admitting: Internal Medicine

## 2017-09-10 ENCOUNTER — Ambulatory Visit: Payer: Self-pay | Admitting: *Deleted

## 2017-09-10 MED ORDER — AMLODIPINE BESYLATE 10 MG PO TABS: 10.0000 mg | ORAL_TABLET | Freq: Every day | ORAL | 1 refills | Status: DC

## 2017-09-10 NOTE — Telephone Encounter (Signed)
Contacted pt's wife and she states that she was able to pick up her husband's blood pressure medication from the pharmacy; she needs no further assistance.

## 2017-09-10 NOTE — Telephone Encounter (Signed)
Patient's wife is calling to request refill of medication patient had been started on while in hospital. He has recently been released from [facility] and he does have a follow up appointment in the office on 09/21/17. She would like the medication sent to [facility]. She can be reached at her cell number [phone].  LOV: 08/19/17   PCP: [place]: verified

## 2017-09-10 NOTE — Telephone Encounter (Signed)
Called pt no answer LMOM Reviewed chart pt is up-to-date sent refills to [facility].Marland KitchenJohny Chess

## 2017-09-16 DIAGNOSIS — Z961 Presence of intraocular lens: Secondary | ICD-10-CM | POA: Diagnosis not present

## 2017-09-16 DIAGNOSIS — E113293 Type 2 diabetes mellitus with mild nonproliferative diabetic retinopathy without macular edema, bilateral: Secondary | ICD-10-CM | POA: Diagnosis not present

## 2017-09-16 DIAGNOSIS — H5203 Hypermetropia, bilateral: Secondary | ICD-10-CM | POA: Diagnosis not present

## 2017-09-16 LAB — HM DIABETES EYE EXAM

## 2017-09-21 ENCOUNTER — Encounter: Payer: Self-pay | Admitting: Internal Medicine

## 2017-09-21 ENCOUNTER — Other Ambulatory Visit (INDEPENDENT_AMBULATORY_CARE_PROVIDER_SITE_OTHER): Payer: Medicare Other

## 2017-09-21 ENCOUNTER — Ambulatory Visit (INDEPENDENT_AMBULATORY_CARE_PROVIDER_SITE_OTHER): Payer: Medicare Other | Admitting: Internal Medicine

## 2017-09-21 VITALS — BP 116/84 | HR 73 | Temp 98.4°F | Ht 70.0 in | Wt 204.0 lb

## 2017-09-21 DIAGNOSIS — D510 Vitamin B12 deficiency anemia due to intrinsic factor deficiency: Secondary | ICD-10-CM

## 2017-09-21 DIAGNOSIS — D539 Nutritional anemia, unspecified: Secondary | ICD-10-CM

## 2017-09-21 DIAGNOSIS — E559 Vitamin D deficiency, unspecified: Secondary | ICD-10-CM

## 2017-09-21 LAB — IBC PANEL
Iron: 58 ug/dL (ref 42–165)
Saturation Ratios: 21.5 % (ref 20.0–50.0)
Transferrin: 193 mg/dL — ABNORMAL LOW (ref 212.0–360.0)

## 2017-09-21 LAB — CBC WITH DIFFERENTIAL/PLATELET
Basophils Absolute: 0 10*3/uL (ref 0.0–0.1)
Basophils Relative: 0.8 % (ref 0.0–3.0)
Eosinophils Absolute: 0.2 10*3/uL (ref 0.0–0.7)
Eosinophils Relative: 3.7 % (ref 0.0–5.0)
HCT: 30.6 % — ABNORMAL LOW (ref 39.0–52.0)
Hemoglobin: 10.1 g/dL — ABNORMAL LOW (ref 13.0–17.0)
Lymphocytes Relative: 19.7 % (ref 12.0–46.0)
Lymphs Abs: 1 10*3/uL (ref 0.7–4.0)
MCHC: 33 g/dL (ref 30.0–36.0)
MCV: 82 fl (ref 78.0–100.0)
Monocytes Absolute: 0.5 10*3/uL (ref 0.1–1.0)
Monocytes Relative: 9.6 % (ref 3.0–12.0)
Neutro Abs: 3.4 10*3/uL (ref 1.4–7.7)
Neutrophils Relative %: 66.2 % (ref 43.0–77.0)
Platelets: 155 10*3/uL (ref 150.0–400.0)
RBC: 3.74 Mil/uL — ABNORMAL LOW (ref 4.22–5.81)
RDW: 14.4 % (ref 11.5–15.5)
WBC: 5.1 10*3/uL (ref 4.0–10.5)

## 2017-09-21 LAB — FOLATE: Folate: 12.9 ng/mL (ref >5.9)

## 2017-09-21 LAB — VITAMIN D 25 HYDROXY (VIT D DEFICIENCY, FRACTURES): VITD: 26.86 ng/mL — ABNORMAL LOW (ref 30.00–100.00)

## 2017-09-21 LAB — FERRITIN: Ferritin: 80.3 ng/mL (ref 22.0–322.0)

## 2017-09-21 MED ORDER — CYANOCOBALAMIN 1000 MCG/ML IJ SOLN
1000.0000 ug | Freq: Once | INTRAMUSCULAR | Status: AC
  Administered 2017-09-21: 1000 ug via INTRAMUSCULAR

## 2017-09-21 NOTE — Progress Notes (Signed)
Subjective:  Patient ID: Frank [name], male    DOB: [birth year]  Age: 77 y.o. MRN: [id number]  CC: Anemia   HPI Frank [name] presents for f/up - He has just completed an intensive rehab at [facility] in [place] for his recent brain injury.  His wife is pleased and says he is doing much better.  He does some activities around the house but when leaving the house he is wheelchair-bound.  He is also being followed for anemia.  He had a B12 injection about a month ago.  He has a history of vitamin D deficiency but he does not know if he is taking a vitamin D supplement.  Outpatient Medications Prior to Visit  Medication Sig Dispense Refill  . acetaminophen (TYLENOL) 500 MG tablet Take 500 mg by mouth every 6 (six) hours as needed for mild pain.    [name] amLODipine (NORVASC) 10 MG tablet Take 1 tablet (10 mg total) by mouth daily. 90 tablet 1  . atorvastatin (LIPITOR) 20 MG tablet Take 0.5 tablets (10 mg total) by mouth daily. 90 tablet 3  . Carbonyl Iron 25 MG TABS Take 25 mg by mouth daily.    . cetirizine (ZYRTEC) 10 MG tablet Take 10 mg by mouth at bedtime.     [name] doxazosin (CARDURA) 2 MG tablet Take 1 tablet (2 mg total) by mouth daily. 90 tablet 1  . FLUoxetine (PROZAC) 20 MG capsule Take 1 capsule (20 mg total) by mouth daily. 90 capsule 1  . fluticasone (FLONASE) 50 MCG/ACT nasal spray Place 1 spray into both nostrils daily as needed for allergies or rhinitis.    [name] glipiZIDE (GLUCOTROL XL) 10 MG 24 hr tablet Take 1 tablet (10 mg total) by mouth daily. 90 tablet 1  . levETIRAcetam (KEPPRA) 500 MG tablet Take 1 tablet (500 mg total) by mouth 2 (two) times daily. 30 tablet 0  . oxybutynin (DITROPAN) 5 MG tablet Take 1 tablet (5 mg total) by mouth 2 (two) times daily. 60 tablet 0  . pantoprazole (PROTONIX) 40 MG tablet Take 1 tablet (40 mg total) by mouth daily. 90 tablet 1  . telmisartan (MICARDIS) 40 MG tablet Take 1 tablet (40 mg total) by mouth daily. 90 tablet 1   No  facility-administered medications prior to visit.     ROS Review of Systems  Constitutional: Positive for fatigue. Negative for chills, diaphoresis and unexpected weight change.  HENT: Negative.  Negative for trouble swallowing.   Eyes: Negative for visual disturbance.  Gastrointestinal: Negative for abdominal pain, constipation, diarrhea, nausea and vomiting.  Endocrine: Negative.   Genitourinary: Negative.  Negative for difficulty urinating.  Musculoskeletal: Positive for gait problem.  Skin: Negative.  Negative for color change and pallor.  Allergic/Immunologic: Negative.   Neurological: Positive for weakness. Negative for headaches.  Hematological: Negative for adenopathy. Does not bruise/bleed easily.  Psychiatric/Behavioral: Negative.     Objective:  BP 116/84 (BP Location: Left Arm, Patient Position: Sitting, Cuff Size: Normal)   Pulse 73   Temp 98.4 F (36.9 C) (Oral)   Ht 5\' 10"  (1.778 m)   Wt 204 lb 0.6 oz (92.6 kg)   SpO2 93%   BMI 29.28 kg/m   BP Readings from Last 3 Encounters:  09/21/17 116/84  08/19/17 120/60  08/07/17 (!) 112/59    Wt Readings from Last 3 Encounters:  09/21/17 204 lb 0.6 oz (92.6 kg)  08/19/17 200 lb 1.3 oz (90.8 kg)  08/06/17 199 lb 4.7 oz (90.4  kg)    Physical Exam  Constitutional: He is oriented to person, place, and time. No distress.  Frail Wheelchair-bound  HENT:  Mouth/Throat: Oropharynx is clear and moist. No oropharyngeal exudate.  Eyes: Conjunctivae are normal. Left eye exhibits no discharge. No scleral icterus.  Neck: Normal range of motion. Neck supple. No JVD present. No thyromegaly present.  Cardiovascular: Normal rate, regular rhythm and normal heart sounds. Exam reveals no gallop and no friction rub.  No murmur heard. Pulmonary/Chest: Effort normal and breath sounds normal. No respiratory distress. He has no wheezes. He has no rales.  Abdominal: Soft. Bowel sounds are normal. He exhibits no distension and no mass.  There is no tenderness. There is no guarding.  Musculoskeletal: Normal range of motion. He exhibits no edema, tenderness or deformity.  Lymphadenopathy:    He has no cervical adenopathy.  Neurological: He is alert and oriented to person, place, and time.  He has diffuse weakness and muscle atrophy but there are no new focal deficits.  Skin: Skin is warm and dry. No rash noted. He is not diaphoretic. No erythema. No pallor.    Lab Results  Component Value Date   WBC 5.1 09/21/2017   HGB 10.1 (L) 09/21/2017   HCT 30.6 (L) 09/21/2017   PLT 155.0 09/21/2017   GLUCOSE 164 (H) 08/05/2017   CHOL 102 06/26/2017   TRIG 88 06/26/2017   HDL 42 06/26/2017   LDLCALC 42 06/26/2017   ALT 19 08/04/2017   AST 20 08/04/2017   NA 138 08/05/2017   K 3.9 08/05/2017   CL 104 08/05/2017   CREATININE 1.07 08/05/2017   BUN 27 (H) 08/05/2017   CO2 25 08/05/2017   TSH 0.89 06/16/2016   PSA 1.08 07/10/2015   INR 0.98 08/04/2017   HGBA1C 7.2 07/20/2017   MICROALBUR 26.0 (H) 02/01/2017    Ct Head Wo Contrast  Result Date: 08/05/2017 CLINICAL DATA:  Follow-up examination for subdural hemorrhage. EXAM: CT HEAD WITHOUT CONTRAST TECHNIQUE: Contiguous axial images were obtained from the base of the skull through the vertex without intravenous contrast. COMPARISON:  Prior CT from 08/04/2017. FINDINGS: Brain: Postoperative changes from interval bilateral burr hole placement for subdural evacuation is seen. Two burr holes have been performed on the left with a single burr hole on the right. Postoperative pneumocephalus present within the extra-axial space bilaterally. There has been partial evaluation of the previously seen bilateral subdural collections, overall decreased in size from previous. Collection on the left now measures up to 12 mm at the level of the left frontal convexity, previously 16 mm. Collection on the right also slightly decreased measuring up to 10 mm in maximal thickness at the level of the  right parietal convexity. Small amount of acute blood products seen layering posteriorly within these collections. Persistent but improved left-to-right shift measuring 6 mm. No hydrocephalus or ventricular trapping. Basilar cisterns remain patent. No other acute intracranial abnormality. Small remote lacunar infarct noted at the right caudate. No acute large vessel territory infarct. No other acute intracranial hemorrhage. No mass lesion. Vascular: No hyperdense vessel. Scattered vascular calcifications noted within the carotid siphons. Skull: Skin staples overlie the bilateral burr hole craniotomies. Associated soft tissue swelling with emphysema. Sinuses/Orbits: Globes and orbital soft tissues within normal limits. Patient status post lens extraction bilaterally. Metallic foreign body just superior to the left globe again noted, unchanged. Scattered ethmoidal, right sphenoid, and right maxillary sinus mucosal thickening, likely chronic. Visualized mastoid air cells are clear. Other: None. IMPRESSION: 1. Postoperative  changes from interval bilateral burr hole craniotomy for subdural evacuation. No complication. 2. Bilateral subdural collections overall decreased in size as compared to previous. Persistent but improved 6 mm of left-to-right midline shift. No hydrocephalus or ventricular trapping. 3. No other new acute intracranial abnormality. Electronically Signed   By: Jeannine Boga M.D.   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 Weakness EXAM: CT HEAD WITHOUT CONTRAST TECHNIQUE: Contiguous axial images were obtained from the base of the skull through the vertex without intravenous contrast. COMPARISON:  06/23/2017, 03/14/2017, MRI 06/24/2017 FINDINGS: Brain: New bilateral supratentorial extra-axial fluid collections, compatible with subdural hematoma. Intermediate density within the  fluid bilaterally with no evidence of acute blood products. Linear differential density overlying the right frontal region, within the subdural collection may represent vasculature or subacute blood products. The larger is the left side, which extends from the anterior cranial fossa overlying the left frontal lobe to the posterior, overlying the parietal lobe. Greatest thickness measured on the coronal reformatted images approximately 17 mm in thickness. There is associated mass effect on the left hemisphere with left to right midline shift measured at the foramen of Monro approximately 8 mm. Associated compression of the left lateral ventricle. The right subdural collection extends from the frontal region over the hemisphere to the parietal region. Greatest thickness measured on coronal reformatted images of approximately 12 mm in thickness. There is associated compression on the underlying right hemisphere. Gray-white differentiation maintained. 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 Vascular: Calcifications of the anterior circulation. Skull: No acute bony abnormality or aggressive bone lesion. Sinuses/Orbits: Bilateral proptosis, with similar configuration to the prior. No mastoid effusion. Scattered ethmoid air cell opacification. Other: None IMPRESSION: New bilateral cerebral convexity intermediate density subdural hematoma, with associated mass effect. Greatest thickness of the left is 17 mm and the right is 12 mm, with net mass effect resulting in left to right midline shift at foramen of Monro of approximately 8 mm. Linear hyperdensity in the anterior right fluid collection may represent vasculature or small subacute blood products. Focal hypodensity in the posterior right parietal region, corresponds to evolving encephalomalacia after prior focal hemorrhage. These results were called by  telephone at the time of interpretation on 08/04/2017 at 7:11 pm to Dr. Nanda Quinton , who verbally acknowledged these results. Electronically Signed   By: Corrie Mckusick D.O.   On: 08/04/2017 19:11    Assessment & Plan:   Jeryn was seen today for anemia.  Diagnoses and all orders for this visit:  Vitamin B12 deficiency anemia due to intrinsic factor deficiency- I have asked him to continue monthly B12 injections. -     CBC with Differential/Platelet; Future -     cyanocobalamin ((VITAMIN B-12)) injection 1,000 mcg  Vitamin D deficiency- His vitamin D level is mildly low.  I have asked him to start a vitamin D supplement to improve his strength and endurance. -     VITAMIN D 25 Hydroxy (Vit-D Deficiency, Fractures); Future -     Cholecalciferol 2000 units TABS; Take 1 tablet (2,000 Units total) by mouth daily.  Deficiency anemia- His H&H remains mildly low.  Will continue monthly B12 injections.  Will also screen for other vitamin deficiencies. -     CBC with Differential/Platelet; Future -     IBC panel; Future -  Ferritin; Future -     Folate; Future -     Vitamin B1; Future   I am having [name] [name] "ken" start on Cholecalciferol. I am also having him maintain his cetirizine, atorvastatin, FLUoxetine, glipiZIDE, oxybutynin, telmisartan, pantoprazole, acetaminophen, Carbonyl Iron, fluticasone, levETIRAcetam, doxazosin, and amLODipine. We administered cyanocobalamin.  Meds ordered this encounter  Medications  . cyanocobalamin ((VITAMIN B-12)) injection 1,000 mcg  . Cholecalciferol 2000 units TABS    Sig: Take 1 tablet (2,000 Units total) by mouth daily.    Dispense:  90 tablet    Refill:  1     Follow-up: Return in about 3 months (around 12/22/2017).  Scarlette Calico, MD

## 2017-09-21 NOTE — Patient Instructions (Signed)
Anemia Anemia is a condition in which you do not have enough red blood cells or hemoglobin. Hemoglobin is a substance in red blood cells that carries oxygen. When you do not have enough red blood cells or hemoglobin (are anemic), your body cannot get enough oxygen and your organs may not work properly. As a result, you may feel very tired or have other problems. What are the causes? Common causes of anemia include:  Excessive bleeding. Anemia can be caused by excessive bleeding inside or outside the body, including bleeding from the intestine or from periods in women.  Poor nutrition.  Long-lasting (chronic) kidney, thyroid, and liver disease.  Bone marrow disorders.  Cancer and treatments for cancer.  HIV (human immunodeficiency virus) and AIDS (acquired immunodeficiency syndrome).  Treatments for HIV and AIDS.  Spleen problems.  Blood disorders.  Infections, medicines, and autoimmune disorders that destroy red blood cells.  What are the signs or symptoms? Symptoms of this condition include:  Minor weakness.  Dizziness.  Headache.  Feeling heartbeats that are irregular or faster than normal (palpitations).  Shortness of breath, especially with exercise.  Paleness.  Cold sensitivity.  Indigestion.  Nausea.  Difficulty sleeping.  Difficulty concentrating.  Symptoms may occur suddenly or develop slowly. If your anemia is mild, you may not have symptoms. How is this diagnosed? This condition is diagnosed based on:  Blood tests.  Your medical history.  A physical exam.  Bone marrow biopsy.  Your health care provider may also check your stool (feces) for blood and may do additional testing to look for the cause of your bleeding. You may also have other tests, including:  Imaging tests, such as a CT scan or MRI.  Endoscopy.  Colonoscopy.  How is this treated? Treatment for this condition depends on the cause. If you continue to lose a lot of blood,  you may need to be treated at a hospital. Treatment may include:  Taking supplements of iron, vitamin B12, or folic acid.  Taking a hormone medicine (erythropoietin) that can help to stimulate red blood cell growth.  Having a blood transfusion. This may be needed if you lose a lot of blood.  Making changes to your diet.  Having surgery to remove your spleen.  Follow these instructions at home:  Take over-the-counter and prescription medicines only as told by your health care provider.  Take supplements only as told by your health care provider.  Follow any diet instructions that you were given.  Keep all follow-up visits as told by your health care provider. This is important. Contact a health care provider if:  You develop new bleeding anywhere in the body. Get help right away if:  You are very weak.  You are short of breath.  You have pain in your abdomen or chest.  You are dizzy or feel faint.  You have trouble concentrating.  You have bloody or black, tarry stools.  You vomit repeatedly or you vomit up blood. Summary  Anemia is a condition in which you do not have enough red blood cells or enough of a substance in your red blood cells that carries oxygen (hemoglobin).  Symptoms may occur suddenly or develop slowly.  If your anemia is mild, you may not have symptoms.  This condition is diagnosed with blood tests as well as a medical history and physical exam. Other tests may be needed.  Treatment for this condition depends on the cause of the anemia. This information is not intended to replace advice   given to you by your health care provider. Make sure you discuss any questions you have with your health care provider. Document Released: 08/06/2004 Document Revised: 07/31/2016 Document Reviewed: 07/31/2016 Elsevier Interactive Patient Education  [employer].

## 2017-09-22 ENCOUNTER — Encounter: Payer: Self-pay | Admitting: Internal Medicine

## 2017-09-22 MED ORDER — CHOLECALCIFEROL 50 MCG (2000 UT) PO TABS: 1.0000 | ORAL_TABLET | Freq: Every day | ORAL | 1 refills | Status: DC

## 2017-09-24 LAB — VITAMIN B1: Vitamin B1 (Thiamine): 17 nmol/L (ref 8–30)

## 2017-09-28 ENCOUNTER — Ambulatory Visit
Admission: RE | Admit: 2017-09-28 | Discharge: 2017-09-28 | Disposition: A | Discharge status: Home / Self Care | Payer: Worker's Compensation | Source: Ambulatory Visit | Attending: Neurosurgery | Admitting: Neurosurgery

## 2017-09-28 DIAGNOSIS — S065XAA Traumatic subdural hemorrhage with loss of consciousness status unknown, initial encounter: Secondary | ICD-10-CM

## 2017-09-28 DIAGNOSIS — S065X9A Traumatic subdural hemorrhage with loss of consciousness of unspecified duration, initial encounter: Secondary | ICD-10-CM

## 2017-09-30 ENCOUNTER — Other Ambulatory Visit: Payer: Self-pay

## 2017-10-11 DIAGNOSIS — L814 Other melanin hyperpigmentation: Secondary | ICD-10-CM | POA: Diagnosis not present

## 2017-10-11 DIAGNOSIS — D1801 Hemangioma of skin and subcutaneous tissue: Secondary | ICD-10-CM | POA: Diagnosis not present

## 2017-10-11 DIAGNOSIS — D225 Melanocytic nevi of trunk: Secondary | ICD-10-CM | POA: Diagnosis not present

## 2017-10-11 DIAGNOSIS — D692 Other nonthrombocytopenic purpura: Secondary | ICD-10-CM | POA: Diagnosis not present

## 2017-10-11 DIAGNOSIS — L570 Actinic keratosis: Secondary | ICD-10-CM | POA: Diagnosis not present

## 2017-10-11 DIAGNOSIS — L821 Other seborrheic keratosis: Secondary | ICD-10-CM | POA: Diagnosis not present

## 2017-10-13 ENCOUNTER — Encounter: Payer: Self-pay | Admitting: Internal Medicine

## 2017-10-19 DIAGNOSIS — G4733 Obstructive sleep apnea (adult) (pediatric): Secondary | ICD-10-CM | POA: Diagnosis not present

## 2017-10-19 MED FILL — SHINGRIX 50 MCG SUS: 50 | 1 days supply | Qty: 1 | Fill #0

## 2017-10-20 ENCOUNTER — Other Ambulatory Visit: Payer: Self-pay | Admitting: Neurosurgery

## 2017-10-20 DIAGNOSIS — S065XAA Traumatic subdural hemorrhage with loss of consciousness status unknown, initial encounter: Secondary | ICD-10-CM

## 2017-10-20 DIAGNOSIS — S065X9A Traumatic subdural hemorrhage with loss of consciousness of unspecified duration, initial encounter: Secondary | ICD-10-CM

## 2017-10-25 ENCOUNTER — Ambulatory Visit
Admission: RE | Admit: 2017-10-25 | Discharge: 2017-10-25 | Disposition: A | Discharge status: Home / Self Care | Payer: Worker's Compensation | Source: Ambulatory Visit | Attending: Neurosurgery | Admitting: Neurosurgery

## 2017-10-25 DIAGNOSIS — S065X9A Traumatic subdural hemorrhage with loss of consciousness of unspecified duration, initial encounter: Secondary | ICD-10-CM

## 2017-10-25 DIAGNOSIS — S065XAA Traumatic subdural hemorrhage with loss of consciousness status unknown, initial encounter: Secondary | ICD-10-CM

## 2017-11-01 ENCOUNTER — Other Ambulatory Visit: Payer: Self-pay | Admitting: Internal Medicine

## 2017-11-01 DIAGNOSIS — I10 Essential (primary) hypertension: Secondary | ICD-10-CM

## 2017-11-01 DIAGNOSIS — N401 Enlarged prostate with lower urinary tract symptoms: Secondary | ICD-10-CM

## 2017-11-01 DIAGNOSIS — E785 Hyperlipidemia, unspecified: Secondary | ICD-10-CM

## 2017-11-01 DIAGNOSIS — E118 Type 2 diabetes mellitus with unspecified complications: Secondary | ICD-10-CM

## 2017-11-03 ENCOUNTER — Telehealth: Payer: Self-pay | Admitting: Internal Medicine

## 2017-11-03 NOTE — Telephone Encounter (Signed)
Copied from [place] [id number]. Topic: Quick Communication - See Telephone Encounter >> Nov 03, 2017 11:36 AM Boyd Kerbs wrote: CRM for notification.   [facility] with [facility] [phone] Ref# [id number] verbal order needed for medication amLODipine (NORVASC) 10 MG tablet.  See Telephone encounter for: 11/03/17.

## 2017-11-04 MED ORDER — AMLODIPINE BESYLATE 10 MG PO TABS: 10.0000 mg | ORAL_TABLET | Freq: Every day | ORAL | 1 refills | Status: DC

## 2017-11-04 NOTE — Telephone Encounter (Signed)
erx sent to [facility].

## 2017-11-15 DIAGNOSIS — G4733 Obstructive sleep apnea (adult) (pediatric): Secondary | ICD-10-CM | POA: Diagnosis not present

## 2017-11-16 ENCOUNTER — Ambulatory Visit (INDEPENDENT_AMBULATORY_CARE_PROVIDER_SITE_OTHER): Payer: Medicare Other | Admitting: Internal Medicine

## 2017-11-16 ENCOUNTER — Encounter: Payer: Self-pay | Admitting: Internal Medicine

## 2017-11-16 ENCOUNTER — Other Ambulatory Visit (INDEPENDENT_AMBULATORY_CARE_PROVIDER_SITE_OTHER): Payer: Medicare Other

## 2017-11-16 VITALS — BP 134/60 | HR 66 | Temp 97.9°F | Resp 16 | Ht 70.0 in | Wt 211.0 lb

## 2017-11-16 DIAGNOSIS — I10 Essential (primary) hypertension: Secondary | ICD-10-CM

## 2017-11-16 DIAGNOSIS — E1142 Type 2 diabetes mellitus with diabetic polyneuropathy: Secondary | ICD-10-CM

## 2017-11-16 DIAGNOSIS — N401 Enlarged prostate with lower urinary tract symptoms: Secondary | ICD-10-CM | POA: Diagnosis not present

## 2017-11-16 DIAGNOSIS — D510 Vitamin B12 deficiency anemia due to intrinsic factor deficiency: Secondary | ICD-10-CM | POA: Diagnosis not present

## 2017-11-16 DIAGNOSIS — N138 Other obstructive and reflux uropathy: Secondary | ICD-10-CM

## 2017-11-16 LAB — CBC WITH DIFFERENTIAL/PLATELET
Basophils Absolute: 0 10*3/uL (ref 0.0–0.1)
Basophils Relative: 0.6 % (ref 0.0–3.0)
Eosinophils Absolute: 0.3 10*3/uL (ref 0.0–0.7)
Eosinophils Relative: 5 % (ref 0.0–5.0)
HCT: 33.2 % — ABNORMAL LOW (ref 39.0–52.0)
Hemoglobin: 10.8 g/dL — ABNORMAL LOW (ref 13.0–17.0)
Lymphocytes Relative: 23.3 % (ref 12.0–46.0)
Lymphs Abs: 1.2 10*3/uL (ref 0.7–4.0)
MCHC: 32.6 g/dL (ref 30.0–36.0)
MCV: 82.3 fl (ref 78.0–100.0)
Monocytes Absolute: 0.7 10*3/uL (ref 0.1–1.0)
Monocytes Relative: 13.3 % — ABNORMAL HIGH (ref 3.0–12.0)
Neutro Abs: 3 10*3/uL (ref 1.4–7.7)
Neutrophils Relative %: 57.8 % (ref 43.0–77.0)
Platelets: 181 10*3/uL (ref 150.0–400.0)
RBC: 4.03 Mil/uL — ABNORMAL LOW (ref 4.22–5.81)
RDW: 14.7 % (ref 11.5–15.5)
WBC: 5.3 10*3/uL (ref 4.0–10.5)

## 2017-11-16 LAB — URINALYSIS, ROUTINE W REFLEX MICROSCOPIC
Bilirubin Urine: NEGATIVE
Hgb urine dipstick: NEGATIVE
Ketones, ur: NEGATIVE
Leukocytes, UA: NEGATIVE
Nitrite: NEGATIVE
RBC / HPF: NONE SEEN (ref 0–?)
Specific Gravity, Urine: 1.02 (ref 1.000–1.030)
Urine Glucose: 100 — AB
Urobilinogen, UA: 0.2 (ref 0.0–1.0)
pH: 6 (ref 5.0–8.0)

## 2017-11-16 LAB — BASIC METABOLIC PANEL
BUN: 24 mg/dL — ABNORMAL HIGH (ref 6–23)
CO2: 30 mEq/L (ref 19–32)
Calcium: 9 mg/dL (ref 8.4–10.5)
Chloride: 100 mEq/L (ref 96–112)
Creatinine, Ser: 1.14 mg/dL (ref 0.40–1.50)
GFR: 65.83 mL/min (ref >60.00)
Glucose, Bld: 209 mg/dL — ABNORMAL HIGH (ref 70–99)
Potassium: 4.3 mEq/L (ref 3.5–5.1)
Sodium: 136 mEq/L (ref 135–145)

## 2017-11-16 LAB — MICROALBUMIN / CREATININE URINE RATIO
Creatinine,U: 98.7 mg/dL
Microalb Creat Ratio: 12.8 mg/g (ref 0.0–30.0)
Microalb, Ur: 12.6 mg/dL — ABNORMAL HIGH (ref 0.0–1.9)

## 2017-11-16 LAB — HEMOGLOBIN A1C: Hgb A1c MFr Bld: 8.3 % — ABNORMAL HIGH (ref 4.6–6.5)

## 2017-11-16 MED ORDER — CYANOCOBALAMIN 1000 MCG/ML IJ SOLN
1000.0000 ug | Freq: Once | INTRAMUSCULAR | Status: AC
  Administered 2017-11-16: 1000 ug via INTRAMUSCULAR

## 2017-11-16 MED ORDER — METFORMIN HCL ER 750 MG PO TB24: 750.0000 mg | ORAL_TABLET | Freq: Every day | ORAL | 1 refills | Status: DC

## 2017-11-16 NOTE — Progress Notes (Signed)
Subjective:  Patient ID: Frank [name], male    DOB: 1940-08-04  Age: 77 y.o. MRN: [id number]  CC: Anemia and Diabetes   HPI Joushua Dugar presents for f/up - He tells me that he has been giving into his sweet tooth and has gained some weight.  He is concerned that this may have elevated his blood sugar.  He continues to take the sulfonylurea but is not on any other agents for blood sugar control.  He denies any recent episodes of polys.  He feels like he is recovering from the subdural hematoma.  He has had no recent headaches or blurred vision.  He continues to struggle with ataxia and feeling off balance.  He ambulates some around the house but when he is out in public he is in a wheelchair.  Outpatient Medications Prior to Visit  Medication Sig Dispense Refill  . acetaminophen (TYLENOL) 500 MG tablet Take 500 mg by mouth every 6 (six) hours as needed for mild pain.    [name] amLODipine (NORVASC) 10 MG tablet Take 1 tablet (10 mg total) by mouth daily. 90 tablet 1  . atorvastatin (LIPITOR) 20 MG tablet TAKE ONE-HALF TABLET BY  MOUTH DAILY 45 tablet 1  . Carbonyl Iron 25 MG TABS Take 25 mg by mouth daily.    . cetirizine (ZYRTEC) 10 MG tablet Take 10 mg by mouth at bedtime.     . Cholecalciferol 2000 units TABS Take 1 tablet (2,000 Units total) by mouth daily. 90 tablet 1  . doxazosin (CARDURA) 2 MG tablet Take 1 tablet (2 mg total) by mouth daily. 90 tablet 1  . doxazosin (CARDURA) 4 MG tablet TAKE ONE-HALF TABLET BY  MOUTH AT BEDTIME 45 tablet 1  . FLUoxetine (PROZAC) 20 MG capsule Take 1 capsule (20 mg total) by mouth daily. 90 capsule 1  . fluticasone (FLONASE) 50 MCG/ACT nasal spray Place 1 spray into both nostrils daily as needed for allergies or rhinitis.    [name] glipiZIDE (GLUCOTROL XL) 10 MG 24 hr tablet Take 1 tablet (10 mg total) by mouth daily. 90 tablet 1  . levETIRAcetam (KEPPRA) 500 MG tablet Take 1 tablet (500 mg total) by mouth 2 (two) times daily. 30 tablet 0  .  oxybutynin (DITROPAN) 5 MG tablet Take 1 tablet (5 mg total) by mouth 2 (two) times daily. 60 tablet 0  . pantoprazole (PROTONIX) 40 MG tablet Take 1 tablet (40 mg total) by mouth daily. 90 tablet 1  . telmisartan (MICARDIS) 40 MG tablet Take 1 tablet (40 mg total) by mouth daily. 90 tablet 1  . atorvastatin (LIPITOR) 20 MG tablet Take 0.5 tablets (10 mg total) by mouth daily. 90 tablet 3   No facility-administered medications prior to visit.     ROS Review of Systems  Constitutional: Negative.  Negative for diaphoresis and fatigue.  HENT: Negative.   Eyes: Negative for visual disturbance.  Respiratory: Negative for cough, chest tightness, shortness of breath and wheezing.   Cardiovascular: Negative.  Negative for chest pain, palpitations and leg swelling.  Gastrointestinal: Negative for abdominal pain, constipation, diarrhea, nausea and vomiting.  Endocrine: Negative for polydipsia, polyphagia and polyuria.  Genitourinary: Negative.  Negative for difficulty urinating.  Musculoskeletal: Positive for gait problem. Negative for back pain and myalgias.  Skin: Negative.   Neurological: Positive for weakness. Negative for dizziness, light-headedness, numbness and headaches.  Hematological: Negative for adenopathy. Does not bruise/bleed easily.  Psychiatric/Behavioral: Negative.     Objective:  BP 134/60 (BP  Location: Left Arm, Patient Position: Sitting, Cuff Size: Normal)   Pulse 66   Temp 97.9 F (36.6 C) (Oral)   Resp 16   Ht 5\' 10"  (1.778 m)   Wt 211 lb (95.7 kg)   SpO2 93%   BMI 30.28 kg/m   BP Readings from Last 3 Encounters:  11/16/17 134/60  09/21/17 116/84  08/19/17 120/60    Wt Readings from Last 3 Encounters:  11/16/17 211 lb (95.7 kg)  09/21/17 204 lb 0.6 oz (92.6 kg)  08/19/17 200 lb 1.3 oz (90.8 kg)    Physical Exam  Constitutional: He is oriented to person, place, and time. No distress.  He is in a wheelchair today  HENT:  Mouth/Throat: Oropharynx is  clear and moist. No oropharyngeal exudate.  Eyes: Conjunctivae are normal. No scleral icterus.  Neck: Normal range of motion. Neck supple. No thyromegaly present.  Cardiovascular: Normal rate, regular rhythm and normal heart sounds. Exam reveals no gallop and no friction rub.  No murmur heard. Pulmonary/Chest: Effort normal and breath sounds normal. No stridor. No respiratory distress. He has no wheezes. He has no rales.  Abdominal: Soft. Bowel sounds are normal. There is no tenderness.  Musculoskeletal: Normal range of motion. He exhibits no edema, tenderness or deformity.  Lymphadenopathy:    He has no cervical adenopathy.  Neurological: He is alert and oriented to person, place, and time.  Skin: Skin is warm and dry. He is not diaphoretic.  Psychiatric: He has a normal mood and affect. His behavior is normal. Judgment and thought content normal.  Vitals reviewed.   Lab Results  Component Value Date   WBC 5.3 11/16/2017   HGB 10.8 (L) 11/16/2017   HCT 33.2 (L) 11/16/2017   PLT 181.0 11/16/2017   GLUCOSE 209 (H) 11/16/2017   CHOL 102 06/26/2017   TRIG 88 06/26/2017   HDL 42 06/26/2017   LDLCALC 42 06/26/2017   ALT 19 08/04/2017   AST 20 08/04/2017   NA 136 11/16/2017   K 4.3 11/16/2017   CL 100 11/16/2017   CREATININE 1.14 11/16/2017   BUN 24 (H) 11/16/2017   CO2 30 11/16/2017   TSH 0.89 06/16/2016   PSA 1.08 07/10/2015   INR 0.98 08/04/2017   HGBA1C 8.3 (H) 11/16/2017   MICROALBUR 12.6 (H) 11/16/2017    Ct Head Wo Contrast  Result Date: 10/25/2017 CLINICAL DATA:  Subdural hematoma. EXAM: CT HEAD WITHOUT CONTRAST TECHNIQUE: Contiguous axial images were obtained from the base of the skull through the vertex without intravenous contrast. COMPARISON:  CT head 09/28/2017. FINDINGS: Brain: Bilateral extra-axial collections are not significantly changed from the prior exam. Mixed density collections are again seen bilaterally. Blood products are more heterogeneous than on the  prior study with increased areas of hyperdense blood. No definite new hemorrhage is present. 5 mm midline shift is again seen.  No cortical infarcts are present. Vascular: Atherosclerotic calcifications are present within the cavernous internal carotid arteries bilaterally. There is no hyperdense vessel. Skull: Bilateral burr holes are again noted. There are 2 burr holes on the left. Calvarium is otherwise intact. No focal lytic or blastic lesions are present. Sinuses/Orbits: The paranasal sinuses and mastoid air cells are clear. Bilateral lens replacements are present. Globes and orbits are otherwise within normal limits. IMPRESSION: 1. Stable size of bilateral extra-axial collections compatible with subdural hemorrhage. 2. Increased heterogeneity of the collections likely represents evolution of blood products. No definite new hemorrhage is present. 3. Stable 5 mm left right midline  shift. Electronically Signed   By: San Morelle M.D.   On: 10/25/2017 09:48    Assessment & Plan:   Markelle was seen today for anemia and diabetes.  Diagnoses and all orders for this visit:  Essential hypertension- His blood pressure is adequately well controlled.  Electrolytes and renal function are normal. -     Basic metabolic panel; Future  Type 2 diabetes mellitus with peripheral neuropathy ([place])- His A1c is up to 8.3%.  I have asked him to add metformin to the SU.  I have also asked him to decrease his intake of carbohydrates. -     Hemoglobin A1c; Future -     metFORMIN (GLUCOPHAGE-XR) 750 MG 24 hr tablet; Take 1 tablet (750 mg total) by mouth daily with breakfast.  Vitamin B12 deficiency anemia due to intrinsic factor deficiency- His H&H  Are slightly improved.  Will continue B12 replacement therapy, there is likely also a component of anemia of chronic disease. -     CBC with Differential/Platelet; Future -     cyanocobalamin ((VITAMIN B-12)) injection 1,000 mcg   I am having [name] [name]  "ken" start on metFORMIN. I am also having him maintain his cetirizine, FLUoxetine, glipiZIDE, oxybutynin, telmisartan, pantoprazole, acetaminophen, Carbonyl Iron, fluticasone, levETIRAcetam, doxazosin, Cholecalciferol, doxazosin, atorvastatin, and amLODipine. We administered cyanocobalamin.  Meds ordered this encounter  Medications  . cyanocobalamin ((VITAMIN B-12)) injection 1,000 mcg  . metFORMIN (GLUCOPHAGE-XR) 750 MG 24 hr tablet    Sig: Take 1 tablet (750 mg total) by mouth daily with breakfast.    Dispense:  90 tablet    Refill:  1     Follow-up: Return in about 6 months (around 05/19/2018).  Scarlette Calico, MD

## 2017-11-16 NOTE — Patient Instructions (Signed)

## 2017-11-30 ENCOUNTER — Emergency Department (HOSPITAL_COMMUNITY): Payer: Worker's Compensation

## 2017-11-30 ENCOUNTER — Other Ambulatory Visit: Payer: Self-pay

## 2017-11-30 ENCOUNTER — Other Ambulatory Visit (HOSPITAL_COMMUNITY): Payer: Self-pay | Admitting: Neurosurgery

## 2017-11-30 ENCOUNTER — Inpatient Hospital Stay (HOSPITAL_COMMUNITY)
Admission: EM | Admit: 2017-11-30 | Discharge: 2017-12-03 | DRG: 027 | Disposition: A | Discharge status: Home / Self Care | Payer: Worker's Compensation | Attending: Neurosurgery | Admitting: Neurosurgery

## 2017-11-30 ENCOUNTER — Encounter (HOSPITAL_COMMUNITY): Payer: Self-pay

## 2017-11-30 DIAGNOSIS — Z8249 Family history of ischemic heart disease and other diseases of the circulatory system: Secondary | ICD-10-CM

## 2017-11-30 DIAGNOSIS — S065X9A Traumatic subdural hemorrhage with loss of consciousness of unspecified duration, initial encounter: Secondary | ICD-10-CM | POA: Diagnosis not present

## 2017-11-30 DIAGNOSIS — I6201 Nontraumatic acute subdural hemorrhage: Secondary | ICD-10-CM

## 2017-11-30 DIAGNOSIS — E785 Hyperlipidemia, unspecified: Secondary | ICD-10-CM | POA: Diagnosis present

## 2017-11-30 DIAGNOSIS — Z9181 History of falling: Secondary | ICD-10-CM

## 2017-11-30 DIAGNOSIS — Z880 Allergy status to penicillin: Secondary | ICD-10-CM

## 2017-11-30 DIAGNOSIS — I6203 Nontraumatic chronic subdural hemorrhage: Secondary | ICD-10-CM | POA: Diagnosis not present

## 2017-11-30 DIAGNOSIS — G4733 Obstructive sleep apnea (adult) (pediatric): Secondary | ICD-10-CM | POA: Diagnosis not present

## 2017-11-30 DIAGNOSIS — Z803 Family history of malignant neoplasm of breast: Secondary | ICD-10-CM

## 2017-11-30 DIAGNOSIS — Z87891 Personal history of nicotine dependence: Secondary | ICD-10-CM | POA: Diagnosis not present

## 2017-11-30 DIAGNOSIS — I10 Essential (primary) hypertension: Secondary | ICD-10-CM | POA: Diagnosis present

## 2017-11-30 DIAGNOSIS — Z888 Allergy status to other drugs, medicaments and biological substances status: Secondary | ICD-10-CM

## 2017-11-30 DIAGNOSIS — E1151 Type 2 diabetes mellitus with diabetic peripheral angiopathy without gangrene: Secondary | ICD-10-CM | POA: Diagnosis not present

## 2017-11-30 DIAGNOSIS — Z9889 Other specified postprocedural states: Secondary | ICD-10-CM

## 2017-11-30 DIAGNOSIS — E1142 Type 2 diabetes mellitus with diabetic polyneuropathy: Secondary | ICD-10-CM | POA: Diagnosis not present

## 2017-11-30 DIAGNOSIS — K219 Gastro-esophageal reflux disease without esophagitis: Secondary | ICD-10-CM | POA: Diagnosis present

## 2017-11-30 DIAGNOSIS — S065XAA Traumatic subdural hemorrhage with loss of consciousness status unknown, initial encounter: Secondary | ICD-10-CM | POA: Diagnosis present

## 2017-11-30 DIAGNOSIS — E1140 Type 2 diabetes mellitus with diabetic neuropathy, unspecified: Secondary | ICD-10-CM | POA: Diagnosis present

## 2017-11-30 DIAGNOSIS — Z882 Allergy status to sulfonamides status: Secondary | ICD-10-CM | POA: Diagnosis not present

## 2017-11-30 DIAGNOSIS — Z7951 Long term (current) use of inhaled steroids: Secondary | ICD-10-CM | POA: Diagnosis not present

## 2017-11-30 DIAGNOSIS — Z7984 Long term (current) use of oral hypoglycemic drugs: Secondary | ICD-10-CM | POA: Diagnosis not present

## 2017-11-30 DIAGNOSIS — I6200 Nontraumatic subdural hemorrhage, unspecified: Secondary | ICD-10-CM | POA: Diagnosis not present

## 2017-11-30 DIAGNOSIS — W19XXXA Unspecified fall, initial encounter: Secondary | ICD-10-CM | POA: Diagnosis present

## 2017-11-30 LAB — CBC WITH DIFFERENTIAL/PLATELET
Abs Immature Granulocytes: 0 10*3/uL (ref 0.0–0.1)
Basophils Absolute: 0.1 10*3/uL (ref 0.0–0.1)
Basophils Relative: 1 %
Eosinophils Absolute: 0.3 10*3/uL (ref 0.0–0.7)
Eosinophils Relative: 4 %
HCT: 36.8 % — ABNORMAL LOW (ref 39.0–52.0)
Hemoglobin: 11.3 g/dL — ABNORMAL LOW (ref 13.0–17.0)
Immature Granulocytes: 0 %
Lymphocytes Relative: 21 %
Lymphs Abs: 1.4 10*3/uL (ref 0.7–4.0)
MCH: 25.9 pg — ABNORMAL LOW (ref 26.0–34.0)
MCHC: 30.7 g/dL (ref 30.0–36.0)
MCV: 84.2 fL (ref 78.0–100.0)
Monocytes Absolute: 0.8 10*3/uL (ref 0.1–1.0)
Monocytes Relative: 12 %
Neutro Abs: 3.9 10*3/uL (ref 1.7–7.7)
Neutrophils Relative %: 62 %
Platelets: 179 10*3/uL (ref 150–400)
RBC: 4.37 MIL/uL (ref 4.22–5.81)
RDW: 14 % (ref 11.5–15.5)
WBC: 6.4 10*3/uL (ref 4.0–10.5)

## 2017-11-30 LAB — URINALYSIS, ROUTINE W REFLEX MICROSCOPIC
Bacteria, UA: NONE SEEN
Bilirubin Urine: NEGATIVE
Glucose, UA: NEGATIVE mg/dL
Ketones, ur: NEGATIVE mg/dL
Leukocytes, UA: NEGATIVE
Nitrite: NEGATIVE
Protein, ur: 30 mg/dL — AB
Specific Gravity, Urine: 1.013 (ref 1.005–1.030)
pH: 7 (ref 5.0–8.0)

## 2017-11-30 LAB — BASIC METABOLIC PANEL
Anion gap: 8 (ref 5–15)
BUN: 26 mg/dL — ABNORMAL HIGH (ref 6–20)
CO2: 27 mmol/L (ref 22–32)
Calcium: 9.2 mg/dL (ref 8.9–10.3)
Chloride: 106 mmol/L (ref 101–111)
Creatinine, Ser: 1.21 mg/dL (ref 0.61–1.24)
GFR calc Af Amer: >60 mL/min (ref >60)
GFR calc non Af Amer: 55 mL/min — ABNORMAL LOW (ref >60)
Glucose, Bld: 167 mg/dL — ABNORMAL HIGH (ref 65–99)
Potassium: 4.4 mmol/L (ref 3.5–5.1)
Sodium: 141 mmol/L (ref 135–145)

## 2017-11-30 NOTE — ED Provider Notes (Signed)
[facility] EMERGENCY DEPARTMENT Provider Note   CSN: [id number] Arrival date & time: 11/30/17  1714     History   Chief Complaint Chief Complaint  Patient presents with  . CT SCAN follow up    HPI Frank [name] is a 77 y.o. male.  HPI  77 year old male, he has a known history of type 2 diabetes, hypertension, traumatic brain injury and underwent bur hole drainage of subdural hematomas bilaterally in January 2019.  Since that time he has been working with physical and occupational therapy and doing well however over after the last 2 weeks he has had a decline in his status according to the physical therapist.  Family has also noted that he has had some difficulty with certain movements especially using his left arm, becoming more fatigued and having some confusion when he tries to do certain tasks.  There is been no fevers vomiting headaches blurred vision or other complaints.  His symptoms have been progressive over the last 2 weeks and gradually worsening.  He was seen at the neurosurgeons office today, CT scan was ordered and the patient was sent to the emergency room to expedite these tests as it could not be done until tomorrow otherwise.  Past Medical History:  Diagnosis Date  . Allergy    Rhinitis  . Anemia    NOS iron deficient and B12 deficient  . Arthritis   . Diabetes mellitus    Type 2  . GERD (gastroesophageal reflux disease)   . Hyperlipidemia   . Hypertension   . 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 OSA (obstructive sleep apnea)    cpap  . TBI (traumatic brain injury) [facility])     Patient Active Problem List   Diagnosis Date Noted  . GERD without esophagitis 07/22/2017  . Depression   . Type 2 diabetes mellitus with peripheral neuropathy ([name])   . Intracranial hematoma following injury ([place]) 06/23/2017  . Morbid obesity ([place]) 05/12/2017  . Arthritis of left hand 12/26/2015  . OAB (overactive bladder) 11/26/2014  . Routine  general medical examination at a health care facility 04/06/2014  . Vitamin D deficiency 07/23/2010  . Obstructive sleep apnea 09/13/2008  . Hyperlipidemia with target LDL less than 100 07/27/2007  . BPH (benign prostatic hyperplasia) 06/21/2007  . 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 B12 deficiency anemia 04/15/2006  . Essential hypertension 04/15/2006  . ALLERGIC RHINITIS 04/15/2006    Past Surgical History:  Procedure Laterality Date  . APPENDECTOMY    . arm fracture Left 2011   with hardware  . BURR HOLE Bilateral 08/04/2017   Procedure: Haskell Flirt;  Surgeon: [name], MD;  Location: [facility];  Service: Neurosurgery;  Laterality: Bilateral;  . COLONOSCOPY    . ESOPHAGOGASTRODUODENOSCOPY  2006   gastritis  . ROTATOR CUFF REPAIR    . SEPTOPLASTY  1959   Deviated Septum  . 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 Authorizing Provider  acetaminophen (TYLENOL) 500 MG tablet Take 500 mg by mouth every 6 (six) hours as needed for mild pain.   Yes [provider]  amLODipine (NORVASC) 10 MG tablet Take 1 tablet (10 mg total) by mouth daily. 11/04/17  Yes Janith Lima, MD  atorvastatin (LIPITOR) 20 MG tablet TAKE ONE-HALF TABLET BY  MOUTH DAILY Patient taking differently: TAKE 10MG  BY  MOUTH DAILY 11/01/17  Yes Janith Lima, MD  Carbonyl Iron 25 MG TABS Take 25 mg by mouth daily.   Yes [provider]  cetirizine (ZYRTEC) 10 MG tablet Take 10 mg by mouth at bedtime.    Yes [provider]  Cholecalciferol 2000 units TABS Take 1 tablet (2,000 Units total) by mouth daily. 09/22/17  Yes Janith Lima, MD  doxazosin (CARDURA) 2 MG tablet Take 1 tablet (2 mg total) by mouth daily. Patient taking differently: Take 2 mg by mouth every evening.  08/19/17  Yes Janith Lima, MD  FLUoxetine (PROZAC) 20 MG capsule Take 1 capsule (20 mg total) by mouth daily.  07/09/17  Yes Angiulli, Lavon Paganini, PA-C  fluticasone (FLONASE) 50 MCG/ACT nasal spray Place 1 spray into both nostrils daily as needed for allergies or rhinitis.   Yes [provider]  glipiZIDE (GLUCOTROL XL) 10 MG 24 hr tablet Take 1 tablet (10 mg total) by mouth daily. 07/09/17  Yes Angiulli, Lavon Paganini, PA-C  levETIRAcetam (KEPPRA) 500 MG tablet Take 1 tablet (500 mg total) by mouth 2 (two) times daily. 08/07/17  Yes Costella, Vista Mink, PA-C  metFORMIN (GLUCOPHAGE-XR) 750 MG 24 hr tablet Take 1 tablet (750 mg total) by mouth daily with breakfast. 11/16/17  Yes Janith Lima, MD  oxybutynin (DITROPAN) 5 MG tablet Take 1 tablet (5 mg total) by mouth 2 (two) times daily. 07/09/17  Yes Angiulli, Lavon Paganini, PA-C  pantoprazole (PROTONIX) 40 MG tablet Take 1 tablet (40 mg total) by mouth daily. 07/22/17  Yes Janith Lima, MD  telmisartan (MICARDIS) 40 MG tablet Take 1 tablet (40 mg total) by mouth daily. 07/09/17  Yes Angiulli, Lavon Paganini, PA-C  doxazosin (CARDURA) 4 MG tablet TAKE ONE-HALF TABLET BY  MOUTH AT BEDTIME Patient not taking: Reported on 11/30/2017 11/01/17   Janith Lima, MD    Family History Family History  Problem Relation Age of Onset  . Heart disease Mother   . Breast cancer Mother   . Stroke Father   . Allergies Father        Father and children  . Coronary artery disease Brother   . Diabetes Neg Hx   . Colon cancer Neg Hx     Social History Social History   Tobacco Use  . Smoking status: Former Smoker    Packs/day: 1.00    Last attempt to quit: 07/14/1983    Years since quitting: 34.4  . Smokeless tobacco: Never Used  Substance Use Topics  . Alcohol use: Yes    Alcohol/week: 1.0 oz    Types: 2 Standard drinks or equivalent per week    Comment: occassionally  . Drug use: No     Allergies   Lisinopril; Penicillins; and Sulfamethoxazole   Review of Systems Review of Systems  All other systems reviewed and are negative.    Physical Exam Updated  Vital Signs BP (!) 156/65 (BP Location: Right Arm)   Pulse 67   Temp 98.6 F (37 C) (Oral)   Resp 18   Ht 5\' 10"  (1.778 m)   Wt 90.7 kg (200 lb)   SpO2 100%   BMI 28.70 kg/m   Physical Exam  Constitutional: He appears well-developed and well-nourished. No distress.  HENT:  Head: Normocephalic and atraumatic.  Mouth/Throat: Oropharynx is clear and moist. No oropharyngeal exudate.  Eyes: Pupils are equal, round, and reactive to light. Conjunctivae and EOM are normal. Right eye exhibits no discharge. Left eye exhibits  no discharge. No scleral icterus.  Neck: Normal range of motion. Neck supple. No JVD present. No thyromegaly present.  Cardiovascular: Normal rate, regular rhythm, normal heart sounds and intact distal pulses. Exam reveals no gallop and no friction rub.  No murmur heard. Pulmonary/Chest: Effort normal and breath sounds normal. No respiratory distress. He has no wheezes. He has no rales.  Abdominal: Soft. Bowel sounds are normal. He exhibits no distension and no mass. There is no tenderness.  Musculoskeletal: Normal range of motion. He exhibits no edema or tenderness.  Lymphadenopathy:    He has no cervical adenopathy.  Neurological: He is alert. Coordination normal.  The patient is able to follow all my commands, he has a normal level of alertness, his speech is normal, there is no obvious facial droop.  He has normal strength in his bilateral upper and lower extremities and is able to grip bilaterally, finger-nose-finger bilaterally, follow commands without difficulty, answers my questions without slurred speech or difficulty.  Skin: Skin is warm and dry. No rash noted. No erythema.  Psychiatric: He has a normal mood and affect. His behavior is normal.  Nursing note and vitals reviewed.    ED Treatments / Results  Labs (all labs ordered are listed, but only abnormal results are displayed) Labs Reviewed  BASIC METABOLIC PANEL - Abnormal; Notable for the following  components:      Result Value   Glucose, Bld 167 (*)    BUN 26 (*)    GFR calc non Af Amer 55 (*)    All other components within normal limits  CBC WITH DIFFERENTIAL/PLATELET - Abnormal; Notable for the following components:   Hemoglobin 11.3 (*)    HCT 36.8 (*)    MCH 25.9 (*)    All other components within normal limits  URINALYSIS, ROUTINE W REFLEX MICROSCOPIC    EKG None  Radiology Ct Head Wo Contrast  Result Date: 11/30/2017 CLINICAL DATA:  Subdural hematoma follow-up EXAM: CT HEAD WITHOUT CONTRAST TECHNIQUE: Contiguous axial images were obtained from the base of the skull through the vertex without intravenous contrast. COMPARISON:  10/25/2017 FINDINGS: Brain: There bilateral mixed density, predominantly hypodense, subdural hematomas. Left-sided collection has undergone some degree of redistribution but the size is unchanged. Thickness of the right-sided collection has slightly decreased, now measuring approximately 8 mm, previously 12 mm. No intraparenchymal hemorrhage. Old right caudate head lacunar infarct. Vascular: No hyperdense vessel or unexpected vascular calcification. Skull: Right frontal and left frontal and parietal burr holes. Sinuses/Orbits: No fluid levels or advanced mucosal thickening of the visualized paranasal sinuses. No mastoid or middle ear effusion. The orbits are normal. IMPRESSION: 1. Slight redistribution of blood within the left-sided mixed age subdural hematoma, but overall unchanged in size. Slightly decreased rightward midline shift measuring 3 mm. 2. Decreased size of right subdural hematoma. Electronically Signed   By: Ulyses Jarred M.D.   On: 11/30/2017 20:23    Procedures .Critical Care Performed by: Noemi Chapel, MD Authorized by: Noemi Chapel, MD   Critical care provider statement:    Critical care time (minutes):  35   Critical care time was exclusive of:  Separately billable procedures and treating other patients and teaching time    Critical care was necessary to treat or prevent imminent or life-threatening deterioration of the following conditions:  CNS failure or compromise   Critical care was time spent personally by me on the following activities:  Blood draw for specimens, development of treatment plan with patient or surrogate, discussions with  consultants, evaluation of patient's response to treatment, examination of patient, obtaining history from patient or surrogate, ordering and performing treatments and interventions, ordering and review of laboratory studies, ordering and review of radiographic studies, pulse oximetry, re-evaluation of patient's condition and review of old charts   (including critical care time)  Medications Ordered in ED Medications - No data to display   Initial Impression / Assessment and Plan / ED Course  I have reviewed the triage vital signs and the nursing notes.  Pertinent labs & imaging results that were available during my care of the patient were reviewed by me and considered in my medical decision making (see chart for details).    I have personally looked at the CT scan and find that there is some reaccumulation of fluid, there is a mixed hyperdensities in that subdural space, he has no focal neurologic findings on exam to raise concern however the blood products in the brain that show some mixed bleeding is of concern and will need to be discussed with neurosurgery.  Discussed with Dr. Ellene Route at 8:30 PM, he will come to see the patient  The pt will be admited to NS service - is critically ill with SDH reoccurance - will need OR tomorrow according to NS - appreciate Dr. Clarice Pole timely assistance and specialty care.  Final Clinical Impressions(s) / ED Diagnoses   Final diagnoses:  Acute on chronic intracranial subdural hematoma [place])    ED Discharge Orders    None       Noemi Chapel, MD 11/30/17 2121

## 2017-11-30 NOTE — ED Triage Notes (Signed)
Pt family reports that pt is needing a follow up CT r/t subdural hematoma. Daughter reports pt is acting similar to how he was when he "had to have 3 holes drilled in his head". Daughter reports pt is more fatigued, memory problems, delayed responses all of which has gotten worse over the last 2 weeks.

## 2017-11-30 NOTE — H&P (Signed)
Frank [name] is an 77 y.o. male.   Chief Complaint: Worsening confusion deterioration of function over 2 weeks time HPI: Frank [name] is a 77 year old right-handed individual who had subdural hematomas decompressed over a month ago by Dr. Willow Ora.  He had bilateral bur holes with 2 bur holes being placed on the left side.  Patient seemed to be making some significant improvement but his wife notes that over the past week to 2 weeks his level of function has diminished he seems more easily confused and he has been unsteady on his feet to a greater degree.  He was seen in the emergency room after having had a CT scan he was seen in the office by Dr. Saintclair Halsted today who suggested that he needs to have a repeat CT scan.  The CT scan demonstrates that the patient has reaccumulation of subdural blood with mixed densities and layering evident on the CT scan suggesting presence of multiple membranes.  Was consulted to evaluate the patient and I noted given the changes that he is been experiencing he will likely need recurrent drainage of left-sided subdural hematomas.  He is now being admitted.  Past Medical History:  Diagnosis Date  . Allergy    Rhinitis  . Anemia    NOS iron deficient and B12 deficient  . Arthritis   . Diabetes mellitus    Type 2  . GERD (gastroesophageal reflux disease)   . Hyperlipidemia   . Hypertension   . 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 OSA (obstructive sleep apnea)    cpap  . TBI (traumatic brain injury) [facility])     Past Surgical History:  Procedure Laterality Date  . APPENDECTOMY    . arm fracture Left 2011   with hardware  . BURR HOLE Bilateral 08/04/2017   Procedure: Haskell Flirt;  Surgeon: [name], MD;  Location: [facility];  Service: Neurosurgery;  Laterality: Bilateral;  . COLONOSCOPY    . ESOPHAGOGASTRODUODENOSCOPY  2006   gastritis  . ROTATOR CUFF REPAIR    . SEPTOPLASTY  1959   Deviated Septum  . 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 Heart disease Mother   . Breast cancer Mother   . Stroke Father   . Allergies Father        Father and children  . Coronary artery disease Brother   . Diabetes Neg Hx   . Colon cancer Neg Hx    Social History:  reports that he quit smoking about 34 years ago. He smoked 1.00 pack per day. He has never used smokeless tobacco. He reports that he drinks about 1.0 oz of alcohol per week. He reports that he does not use drugs.  Allergies:  Allergies  Allergen Reactions  . Lisinopril Cough  . Penicillins     REACTION: unspecified  Allergy as a child.  Patient does not remember reaction.  Has had cephalasporins without problems.  . Sulfamethoxazole     REACTION: as child     (Not in a hospital admission)  Results for orders placed or performed during the hospital encounter of 11/30/17 (from the past 48 hour(s))  Basic metabolic panel     Status: Abnormal   Collection Time: 11/30/17  5:56 PM  Result Value Ref Range   Sodium 141 135 - 145 mmol/L   Potassium 4.4 3.5 - 5.1 mmol/L   Chloride 106 101 - 111 mmol/L  CO2 27 22 - 32 mmol/L   Glucose, Bld 167 (H) 65 - 99 mg/dL   BUN 26 (H) 6 - 20 mg/dL   Creatinine, Ser 1.21 0.61 - 1.24 mg/dL   Calcium 9.2 8.9 - 10.3 mg/dL   GFR calc non Af Amer 55 (L) >60 mL/min   GFR calc Af Amer >60 >60 mL/min    Comment: (NOTE) The eGFR has been calculated using the CKD EPI equation. This calculation has not been validated in all clinical situations. eGFR's persistently <60 mL/min signify possible Chronic Kidney Disease.    Anion gap 8 5 - 15    Comment: Performed at [facility], [address] [place], [place] [address]  CBC with Differential     Status: Abnormal   Collection Time: 11/30/17  5:56 PM  Result Value Ref Range   WBC 6.4 4.0 - 10.5 K/uL   RBC 4.37 4.22 - 5.81 MIL/uL   Hemoglobin 11.3 (L) 13.0 - 17.0 g/dL   HCT 36.8 (L) 39.0 - 52.0 %   MCV 84.2 78.0 - 100.0 fL   MCH  25.9 (L) 26.0 - 34.0 pg   MCHC 30.7 30.0 - 36.0 g/dL   RDW 14.0 11.5 - 15.5 %   Platelets 179 150 - 400 K/uL   Neutrophils Relative % 62 %   Neutro Abs 3.9 1.7 - 7.7 K/uL   Lymphocytes Relative 21 %   Lymphs Abs 1.4 0.7 - 4.0 K/uL   Monocytes Relative 12 %   Monocytes Absolute 0.8 0.1 - 1.0 K/uL   Eosinophils Relative 4 %   Eosinophils Absolute 0.3 0.0 - 0.7 K/uL   Basophils Relative 1 %   Basophils Absolute 0.1 0.0 - 0.1 K/uL   Immature Granulocytes 0 %   Abs Immature Granulocytes 0.0 0.0 - 0.1 K/uL    Comment: Performed at [facility], [address] [place], [place] [address]  Urinalysis, Routine w reflex microscopic     Status: Abnormal   Collection Time: 11/30/17  9:40 PM  Result Value Ref Range   Color, Urine YELLOW YELLOW   APPearance CLEAR CLEAR   Specific Gravity, Urine 1.013 1.005 - 1.030   pH 7.0 5.0 - 8.0   Glucose, UA NEGATIVE NEGATIVE mg/dL   Hgb urine dipstick SMALL (A) NEGATIVE   Bilirubin Urine NEGATIVE NEGATIVE   Ketones, ur NEGATIVE NEGATIVE mg/dL   Protein, ur 30 (A) NEGATIVE mg/dL   Nitrite NEGATIVE NEGATIVE   Leukocytes, UA NEGATIVE NEGATIVE   RBC / HPF 0-5 0 - 5 RBC/hpf   WBC, UA 0-5 0 - 5 WBC/hpf   Bacteria, UA NONE SEEN NONE SEEN   Mucus PRESENT     Comment: Performed at [facility], [address] [place], [place] [address]   Ct Head Wo Contrast  Result Date: 11/30/2017 CLINICAL DATA:  Subdural hematoma follow-up EXAM: CT HEAD WITHOUT CONTRAST TECHNIQUE: Contiguous axial images were obtained from the base of the skull through the vertex without intravenous contrast. COMPARISON:  10/25/2017 FINDINGS: Brain: There bilateral mixed density, predominantly hypodense, subdural hematomas. Left-sided collection has undergone some degree of redistribution but the size is unchanged. Thickness of the right-sided collection has slightly decreased, now measuring approximately 8 mm, previously 12 mm. No intraparenchymal hemorrhage. Old right caudate  head lacunar infarct. Vascular: No hyperdense vessel or unexpected vascular calcification. Skull: Right frontal and left frontal and parietal burr holes. Sinuses/Orbits: No fluid levels or advanced mucosal thickening of the visualized paranasal sinuses.   No mastoid or middle ear effusion. The orbits are normal. IMPRESSION: 1. Slight redistribution of blood within the left-sided mixed age subdural hematoma, but overall unchanged in size. Slightly decreased rightward midline shift measuring 3 mm. 2. Decreased size of right subdural hematoma. Electronically Signed   By: Kevin  Herman M.D.   On: 11/30/2017 20:23    Review of Systems  Constitutional: Positive for malaise/fatigue.  HENT: Negative.   Eyes: Negative.   Respiratory: Negative.   Cardiovascular: Negative.   Gastrointestinal: Negative.   Genitourinary: Negative.   Musculoskeletal: Negative.   Skin: Negative.   Neurological:       Mild confusion and unsteadiness in gait  Psychiatric/Behavioral: Negative.     Blood pressure (!) 156/65, pulse 67, temperature 98.6 F (37 C), temperature source Oral, resp. rate 18, height 5' 10" (1.778 m), weight 90.7 kg (200 lb), SpO2 100 %. Physical Exam  Constitutional: He is oriented to person, place, and time. He appears well-developed and well-nourished.  HENT:  Head: Normocephalic.  Well-healed scars from previous bur holes 2 on the left one on the right  Eyes: Pupils are equal, round, and reactive to light. EOM are normal.  Neck: Normal range of motion. Neck supple.  Cardiovascular: Normal rate and regular rhythm.  Respiratory: Effort normal and breath sounds normal.  GI: Soft. Bowel sounds are normal.  Musculoskeletal: Normal range of motion.  Neurological: He is alert and oriented to person, place, and time. He has normal reflexes.  Mild right sided cortical drift.  Skin: Skin is warm and dry.  Psychiatric: He has a normal mood and affect. His behavior is normal. Judgment and thought  content normal.     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  Plan: Admit for surgical decompression via bur hole and possible craniotomy for evacuation of subdural.  , J, MD 11/30/2017, 10:04 PM   

## 2017-11-30 NOTE — ED Provider Notes (Signed)
Patient placed in Quick Look pathway, seen and evaluated   Chief Complaint: Follow-up CT scan   HPI:   77 y.o. M with PMH/o Subdural hematoma who presents for evaluation of follow-up CT scan of the head.  Patient was referred by his neurosurgeon Dr. Saintclair Halsted for repeat CT.  Patient had a fall in December 2018 that resulted in a subdural hematoma.  Patient was hospitalized and had bur holes performed.  Patient has been having rehab since then.  Family reports that patient had been progressing well in rehab but states that he had a sudden decline and states that he was having difficulty completing the assignments.  Family reports that patient hand would not complete the movements as directed.  We discussed with neurosurgery and was told that he needs to get a repeat CT to make sure there is no subdural hematoma.  Patient states he has been having left-sided deficits since the incident.  Denies any changes in those deficits.  Patient denies any vision changes, difficulty speaking, chest pain, difficulty breathing, new numbness/weakness.  ROS: -CP, SOB, Vision changes, new numbness/weakness  Physical Exam:   Gen: No distress  Neuro: Awake and Alert  Skin: Warm    Focused Exam:   Cranial nerves III-XII intact  Follows commands, Moves all extremities   5/5 strength to RLE and RUE.  Slightly diminished strength noted to LUE and LLE.  Sensation intact throughout all major nerve distributions  Normal finger to nose.  No dysdiadochokinesia.  No pronator drift.   No slurred speech. No facial droop.    Initiation of care has begun. The patient has been counseled on the process, plan, and necessity for staying for the completion/evaluation, and the remainder of the medical screening examination    Desma Mcgregor 11/30/17 1750    Noemi Chapel, MD 11/30/17 2031

## 2017-12-01 ENCOUNTER — Inpatient Hospital Stay (HOSPITAL_COMMUNITY)
Admission: EM | Disposition: A | Discharge status: Home / Self Care | Payer: Self-pay | Source: Home / Self Care | Attending: Neurosurgery

## 2017-12-01 ENCOUNTER — Inpatient Hospital Stay (HOSPITAL_COMMUNITY): Payer: Worker's Compensation | Admitting: Anesthesiology

## 2017-12-01 ENCOUNTER — Encounter (HOSPITAL_COMMUNITY): Payer: Self-pay | Admitting: Orthopedic Surgery

## 2017-12-01 DIAGNOSIS — S065XAA Traumatic subdural hemorrhage with loss of consciousness status unknown, initial encounter: Secondary | ICD-10-CM | POA: Diagnosis present

## 2017-12-01 DIAGNOSIS — Z9889 Other specified postprocedural states: Secondary | ICD-10-CM

## 2017-12-01 DIAGNOSIS — S065X9A Traumatic subdural hemorrhage with loss of consciousness of unspecified duration, initial encounter: Secondary | ICD-10-CM | POA: Diagnosis present

## 2017-12-01 HISTORY — PX: CRANIOTOMY: SHX93

## 2017-12-01 LAB — CBC
HCT: 32 % — ABNORMAL LOW (ref 39.0–52.0)
HCT: 35.2 % — ABNORMAL LOW (ref 39.0–52.0)
Hemoglobin: 10 g/dL — ABNORMAL LOW (ref 13.0–17.0)
Hemoglobin: 10.9 g/dL — ABNORMAL LOW (ref 13.0–17.0)
MCH: 25.8 pg — ABNORMAL LOW (ref 26.0–34.0)
MCH: 26 pg (ref 26.0–34.0)
MCHC: 31.3 g/dL (ref 30.0–36.0)
MCHC: 31 g/dL (ref 30.0–36.0)
MCV: 82.7 fL (ref 78.0–100.0)
MCV: 83.8 fL (ref 78.0–100.0)
Platelets: 141 10*3/uL — ABNORMAL LOW (ref 150–400)
Platelets: 154 10*3/uL (ref 150–400)
RBC: 3.87 MIL/uL — ABNORMAL LOW (ref 4.22–5.81)
RBC: 4.2 MIL/uL — ABNORMAL LOW (ref 4.22–5.81)
RDW: 14 % (ref 11.5–15.5)
RDW: 13.8 % (ref 11.5–15.5)
WBC: 6.8 10*3/uL (ref 4.0–10.5)
WBC: 8.5 10*3/uL (ref 4.0–10.5)

## 2017-12-01 LAB — BASIC METABOLIC PANEL WITH GFR
Anion gap: 8 (ref 5–15)
BUN: 21 mg/dL — ABNORMAL HIGH (ref 6–20)
CO2: 28 mmol/L (ref 22–32)
Calcium: 8.9 mg/dL (ref 8.9–10.3)
Chloride: 103 mmol/L (ref 101–111)
Creatinine, Ser: 1.18 mg/dL (ref 0.61–1.24)
GFR calc Af Amer: >60 mL/min
GFR calc non Af Amer: 57 mL/min — ABNORMAL LOW
Glucose, Bld: 248 mg/dL — ABNORMAL HIGH (ref 65–99)
Potassium: 4.1 mmol/L (ref 3.5–5.1)
Sodium: 139 mmol/L (ref 135–145)

## 2017-12-01 LAB — GLUCOSE, CAPILLARY
Glucose-Capillary: 162 mg/dL — ABNORMAL HIGH (ref 65–99)
Glucose-Capillary: 244 mg/dL — ABNORMAL HIGH (ref 65–99)
Glucose-Capillary: 212 mg/dL — ABNORMAL HIGH (ref 65–99)
Glucose-Capillary: 213 mg/dL — ABNORMAL HIGH (ref 65–99)

## 2017-12-01 LAB — TYPE AND SCREEN
ABO/RH(D): B POS
Antibody Screen: NEGATIVE

## 2017-12-01 LAB — CBG MONITORING, ED: Glucose-Capillary: 155 mg/dL — ABNORMAL HIGH (ref 65–99)

## 2017-12-01 SURGERY — CRANIOTOMY HEMATOMA EVACUATION SUBDURAL
Anesthesia: General | Site: Head | Laterality: Left

## 2017-12-01 MED ORDER — ONDANSETRON HCL 4 MG/2ML IJ SOLN: 4.0000 mg | INTRAMUSCULAR | Status: DC | PRN

## 2017-12-01 MED ORDER — MORPHINE SULFATE (PF) 4 MG/ML IV SOLN
INTRAVENOUS | Status: AC
  Filled 2017-12-01: qty 1

## 2017-12-01 MED ORDER — IRBESARTAN 150 MG PO TABS
150.0000 mg | ORAL_TABLET | Freq: Every day | ORAL | Status: DC
  Administered 2017-12-02 – 2017-12-03 (×2): 150 mg via ORAL
  Filled 2017-12-01 (×3): qty 1

## 2017-12-01 MED ORDER — NEOSTIGMINE METHYLSULFATE 10 MG/10ML IV SOLN
INTRAVENOUS | Status: DC | PRN
  Administered 2017-12-01: 3 mg via INTRAVENOUS

## 2017-12-01 MED ORDER — ONDANSETRON HCL 4 MG/2ML IJ SOLN
INTRAMUSCULAR | Status: DC | PRN
  Administered 2017-12-01: 4 mg via INTRAVENOUS

## 2017-12-01 MED ORDER — PROPOFOL 10 MG/ML IV BOLUS
INTRAVENOUS | Status: DC | PRN
  Administered 2017-12-01: 120 mg via INTRAVENOUS

## 2017-12-01 MED ORDER — PHENYLEPHRINE HCL 10 MG/ML IJ SOLN
INTRAMUSCULAR | Status: DC | PRN
  Administered 2017-12-01: 120 ug via INTRAVENOUS

## 2017-12-01 MED ORDER — DOXAZOSIN MESYLATE 2 MG PO TABS
2.0000 mg | ORAL_TABLET | Freq: Every evening | ORAL | Status: DC
  Administered 2017-12-01 – 2017-12-02 (×2): 2 mg via ORAL
  Filled 2017-12-01 (×3): qty 1

## 2017-12-01 MED ORDER — PROPOFOL 10 MG/ML IV BOLUS
INTRAVENOUS | Status: AC
  Filled 2017-12-01: qty 20

## 2017-12-01 MED ORDER — VANCOMYCIN HCL IN DEXTROSE 1-5 GM/200ML-% IV SOLN
INTRAVENOUS | Status: AC
  Filled 2017-12-01: qty 200

## 2017-12-01 MED ORDER — FLUOXETINE HCL 20 MG PO CAPS
20.0000 mg | ORAL_CAPSULE | Freq: Every day | ORAL | Status: DC
  Administered 2017-12-02 – 2017-12-03 (×2): 20 mg via ORAL
  Filled 2017-12-01 (×2): qty 1

## 2017-12-01 MED ORDER — FLUTICASONE PROPIONATE 50 MCG/ACT NA SUSP
1.0000 | Freq: Every day | NASAL | Status: DC | PRN
Start: 2017-12-01 — End: 2017-12-03

## 2017-12-01 MED ORDER — LEVETIRACETAM IN NACL 500 MG/100ML IV SOLN
500.0000 mg | Freq: Two times a day (BID) | INTRAVENOUS | Status: DC
  Administered 2017-12-01 – 2017-12-03 (×4): 500 mg via INTRAVENOUS
  Filled 2017-12-01 (×4): qty 100

## 2017-12-01 MED ORDER — MORPHINE SULFATE (PF) 4 MG/ML IV SOLN
1.0000 mg | INTRAVENOUS | Status: DC | PRN
  Administered 2017-12-01 (×3): 2 mg via INTRAVENOUS

## 2017-12-01 MED ORDER — DEXAMETHASONE SODIUM PHOSPHATE 10 MG/ML IJ SOLN
6.0000 mg | Freq: Four times a day (QID) | INTRAMUSCULAR | Status: AC
  Administered 2017-12-02 (×4): 6 mg via INTRAVENOUS
  Filled 2017-12-01 (×4): qty 1

## 2017-12-01 MED ORDER — ROCURONIUM BROMIDE 100 MG/10ML IV SOLN
INTRAVENOUS | Status: DC | PRN
  Administered 2017-12-01: 50 mg via INTRAVENOUS

## 2017-12-01 MED ORDER — DOCUSATE SODIUM 100 MG PO CAPS
100.0000 mg | ORAL_CAPSULE | Freq: Two times a day (BID) | ORAL | Status: DC
  Administered 2017-12-02 – 2017-12-03 (×3): 100 mg via ORAL
  Filled 2017-12-01 (×3): qty 1

## 2017-12-01 MED ORDER — SODIUM CHLORIDE 0.9 % IV SOLN
INTRAVENOUS | Status: DC | PRN
  Administered 2017-12-01: 500 mL

## 2017-12-01 MED ORDER — DEXAMETHASONE SODIUM PHOSPHATE 4 MG/ML IJ SOLN
4.0000 mg | Freq: Three times a day (TID) | INTRAMUSCULAR | Status: DC
Start: 2017-12-04 — End: 2017-12-03

## 2017-12-01 MED ORDER — HEMOSTATIC AGENTS (NO CHARGE) OPTIME
TOPICAL | Status: DC | PRN
  Administered 2017-12-01 (×2): 1 via TOPICAL

## 2017-12-01 MED ORDER — DEXAMETHASONE SODIUM PHOSPHATE 4 MG/ML IJ SOLN
4.0000 mg | Freq: Four times a day (QID) | INTRAMUSCULAR | Status: DC
  Administered 2017-12-03 (×3): 4 mg via INTRAVENOUS
  Filled 2017-12-01 (×3): qty 1

## 2017-12-01 MED ORDER — GLIPIZIDE ER 10 MG PO TB24
10.0000 mg | ORAL_TABLET | Freq: Every day | ORAL | Status: DC
  Administered 2017-12-02 – 2017-12-03 (×2): 10 mg via ORAL
  Filled 2017-12-01 (×3): qty 1

## 2017-12-01 MED ORDER — GLYCOPYRROLATE 0.2 MG/ML IJ SOLN
INTRAMUSCULAR | Status: DC | PRN
  Administered 2017-12-01: 0.4 mg via INTRAVENOUS

## 2017-12-01 MED ORDER — ARTIFICIAL TEARS OPHTHALMIC OINT
TOPICAL_OINTMENT | OPHTHALMIC | Status: DC | PRN
  Administered 2017-12-01: 1 via OPHTHALMIC

## 2017-12-01 MED ORDER — ONDANSETRON HCL 4 MG PO TABS: 4.0000 mg | ORAL_TABLET | ORAL | Status: DC | PRN

## 2017-12-01 MED ORDER — OXYBUTYNIN CHLORIDE 5 MG PO TABS
5.0000 mg | ORAL_TABLET | Freq: Two times a day (BID) | ORAL | Status: DC
  Administered 2017-12-01 – 2017-12-03 (×4): 5 mg via ORAL
  Filled 2017-12-01 (×5): qty 1

## 2017-12-01 MED ORDER — VANCOMYCIN HCL IN DEXTROSE 1-5 GM/200ML-% IV SOLN
1000.0000 mg | Freq: Once | INTRAVENOUS | Status: AC
  Administered 2017-12-01: 1000 mg via INTRAVENOUS

## 2017-12-01 MED ORDER — BACITRACIN ZINC 500 UNIT/GM EX OINT
TOPICAL_OINTMENT | CUTANEOUS | Status: AC
  Filled 2017-12-01: qty 28.35

## 2017-12-01 MED ORDER — SODIUM CHLORIDE 0.9 % IV SOLN
INTRAVENOUS | Status: DC
  Administered 2017-12-01: 12:00:00 via INTRAVENOUS

## 2017-12-01 MED ORDER — VANCOMYCIN HCL 1000 MG IV SOLR
INTRAVENOUS | Status: AC
  Filled 2017-12-01: qty 1000

## 2017-12-01 MED ORDER — HYDROMORPHONE HCL 1 MG/ML IJ SOLN
0.5000 mg | INTRAMUSCULAR | Status: DC | PRN
  Administered 2017-12-01 – 2017-12-02 (×2): 0.5 mg via INTRAVENOUS
  Filled 2017-12-01 (×2): qty 1

## 2017-12-01 MED ORDER — POTASSIUM CHLORIDE IN NACL 20-0.9 MEQ/L-% IV SOLN
INTRAVENOUS | Status: DC
  Administered 2017-12-01 – 2017-12-03 (×3): via INTRAVENOUS
  Filled 2017-12-01 (×4): qty 1000

## 2017-12-01 MED ORDER — THROMBIN (RECOMBINANT) 20000 UNITS EX SOLR
CUTANEOUS | Status: DC | PRN
  Administered 2017-12-01: 20 mL via TOPICAL

## 2017-12-01 MED ORDER — EPHEDRINE SULFATE 50 MG/ML IJ SOLN
INTRAMUSCULAR | Status: AC
  Filled 2017-12-01: qty 1

## 2017-12-01 MED ORDER — THROMBIN 20000 UNITS EX SOLR
CUTANEOUS | Status: AC
  Filled 2017-12-01: qty 20000

## 2017-12-01 MED ORDER — LIDOCAINE HCL (CARDIAC) PF 100 MG/5ML IV SOSY
PREFILLED_SYRINGE | INTRAVENOUS | Status: DC | PRN
  Administered 2017-12-01: 60 mg via INTRAVENOUS

## 2017-12-01 MED ORDER — PANTOPRAZOLE SODIUM 40 MG PO TBEC
40.0000 mg | DELAYED_RELEASE_TABLET | Freq: Every day | ORAL | Status: DC
  Administered 2017-12-02 – 2017-12-03 (×2): 40 mg via ORAL
  Filled 2017-12-01 (×2): qty 1

## 2017-12-01 MED ORDER — MUPIROCIN 2 % EX OINT: 1.0000 "application " | TOPICAL_OINTMENT | Freq: Once | CUTANEOUS | Status: DC

## 2017-12-01 MED ORDER — PANTOPRAZOLE SODIUM 40 MG PO TBEC: 40.0000 mg | DELAYED_RELEASE_TABLET | Freq: Every day | ORAL | Status: DC

## 2017-12-01 MED ORDER — EPHEDRINE SULFATE 50 MG/ML IJ SOLN
INTRAMUSCULAR | Status: DC | PRN
  Administered 2017-12-01 (×5): 10 mg via INTRAVENOUS

## 2017-12-01 MED ORDER — AMLODIPINE BESYLATE 10 MG PO TABS
10.0000 mg | ORAL_TABLET | Freq: Every day | ORAL | Status: DC
  Administered 2017-12-02 – 2017-12-03 (×2): 10 mg via ORAL
  Filled 2017-12-01 (×2): qty 1

## 2017-12-01 MED ORDER — CEFAZOLIN SODIUM-DEXTROSE 2-4 GM/100ML-% IV SOLN
2.0000 g | Freq: Three times a day (TID) | INTRAVENOUS | Status: AC
  Administered 2017-12-02 (×2): 2 g via INTRAVENOUS
  Filled 2017-12-01 (×2): qty 100

## 2017-12-01 MED ORDER — LABETALOL HCL 5 MG/ML IV SOLN
10.0000 mg | INTRAVENOUS | Status: DC | PRN
  Administered 2017-12-03: 10 mg via INTRAVENOUS
  Filled 2017-12-01: qty 4

## 2017-12-01 MED ORDER — 0.9 % SODIUM CHLORIDE (POUR BTL) OPTIME
TOPICAL | Status: DC | PRN
  Administered 2017-12-01 (×2): 1000 mL

## 2017-12-01 MED ORDER — PROMETHAZINE HCL 25 MG/ML IJ SOLN: 6.2500 mg | INTRAMUSCULAR | Status: DC | PRN

## 2017-12-01 MED ORDER — FENTANYL CITRATE (PF) 100 MCG/2ML IJ SOLN
INTRAMUSCULAR | Status: DC | PRN
  Administered 2017-12-01: 25 ug via INTRAVENOUS
  Administered 2017-12-01: 100 ug via INTRAVENOUS

## 2017-12-01 MED ORDER — INSULIN ASPART 100 UNIT/ML ~~LOC~~ SOLN
SUBCUTANEOUS | Status: AC
  Filled 2017-12-01: qty 1

## 2017-12-01 MED ORDER — BACITRACIN ZINC 500 UNIT/GM EX OINT
TOPICAL_OINTMENT | CUTANEOUS | Status: DC | PRN
  Administered 2017-12-01: 1 via TOPICAL

## 2017-12-01 MED ORDER — METFORMIN HCL ER 750 MG PO TB24
750.0000 mg | ORAL_TABLET | Freq: Every day | ORAL | Status: DC
  Administered 2017-12-02 – 2017-12-03 (×2): 750 mg via ORAL
  Filled 2017-12-01 (×3): qty 1

## 2017-12-01 MED ORDER — ROCURONIUM BROMIDE 50 MG/5ML IV SOLN
INTRAVENOUS | Status: AC
  Filled 2017-12-01: qty 1

## 2017-12-01 MED ORDER — LIDOCAINE-EPINEPHRINE 1 %-1:100000 IJ SOLN
INTRAMUSCULAR | Status: DC | PRN
  Administered 2017-12-01: 4 mL

## 2017-12-01 MED ORDER — DEXAMETHASONE SODIUM PHOSPHATE 10 MG/ML IJ SOLN
INTRAMUSCULAR | Status: DC | PRN
  Administered 2017-12-01: 10 mg via INTRAVENOUS

## 2017-12-01 MED ORDER — ATORVASTATIN CALCIUM 10 MG PO TABS
10.0000 mg | ORAL_TABLET | Freq: Every day | ORAL | Status: DC
  Administered 2017-12-02 – 2017-12-03 (×2): 10 mg via ORAL
  Filled 2017-12-01 (×2): qty 1

## 2017-12-01 MED ORDER — NEOSTIGMINE METHYLSULFATE 5 MG/5ML IV SOSY
PREFILLED_SYRINGE | INTRAVENOUS | Status: AC
  Filled 2017-12-01: qty 5

## 2017-12-01 MED ORDER — HYDROCODONE-ACETAMINOPHEN 5-325 MG PO TABS: 1.0000 | ORAL_TABLET | ORAL | Status: DC | PRN

## 2017-12-01 MED ORDER — THROMBIN 5000 UNITS EX SOLR
CUTANEOUS | Status: AC
  Filled 2017-12-01: qty 5000

## 2017-12-01 MED ORDER — LEVETIRACETAM 500 MG PO TABS
500.0000 mg | ORAL_TABLET | Freq: Two times a day (BID) | ORAL | Status: DC
  Administered 2017-12-01: 500 mg via ORAL
  Filled 2017-12-01: qty 1

## 2017-12-01 MED ORDER — PROMETHAZINE HCL 25 MG PO TABS: 12.5000 mg | ORAL_TABLET | ORAL | Status: DC | PRN

## 2017-12-01 MED ORDER — PHENYLEPHRINE 40 MCG/ML (10ML) SYRINGE FOR IV PUSH (FOR BLOOD PRESSURE SUPPORT)
PREFILLED_SYRINGE | INTRAVENOUS | Status: AC
  Filled 2017-12-01: qty 10

## 2017-12-01 MED ORDER — INSULIN ASPART 100 UNIT/ML ~~LOC~~ SOLN
4.0000 [IU] | Freq: Once | SUBCUTANEOUS | Status: AC
  Administered 2017-12-01: 4 [IU] via SUBCUTANEOUS

## 2017-12-01 MED ORDER — FENTANYL CITRATE (PF) 250 MCG/5ML IJ SOLN
INTRAMUSCULAR | Status: AC
  Filled 2017-12-01: qty 5

## 2017-12-01 MED ORDER — LIDOCAINE-EPINEPHRINE 1 %-1:100000 IJ SOLN
INTRAMUSCULAR | Status: AC
Start: 2017-12-01 — End: ?
  Filled 2017-12-01: qty 1

## 2017-12-01 SURGICAL SUPPLY — 77 items
BAG DECANTER FOR FLEXI CONT (MISCELLANEOUS) ×2 IMPLANT
BANDAGE GAUZE 4  KLING STR (GAUZE/BANDAGES/DRESSINGS) IMPLANT
BIT DRILL WIRE PASS 1.3MM (BIT) ×1 IMPLANT
BLADE CLIPPER SURG (BLADE) ×2 IMPLANT
BLADE SURG 11 STRL SS (BLADE) ×2 IMPLANT
BNDG COHESIVE 4X5 TAN NS LF (GAUZE/BANDAGES/DRESSINGS) IMPLANT
BUR ACORN 9.0 PRECISION (BURR) ×2 IMPLANT
BUR SPIRAL ROUTER 2.3 (BUR) ×2 IMPLANT
CANISTER SUCT 3000ML PPV (MISCELLANEOUS) ×2 IMPLANT
CARTRIDGE OIL MAESTRO DRILL (MISCELLANEOUS) ×1 IMPLANT
CATH VENTRIC 35X38 W/TROCAR LG (CATHETERS) ×2 IMPLANT
CLIP VESOCCLUDE MED 6/CT (CLIP) IMPLANT
COVER BACK TABLE 60X90IN (DRAPES) IMPLANT
DECANTER SPIKE VIAL GLASS SM (MISCELLANEOUS) ×2 IMPLANT
DERMABOND ADVANCED (GAUZE/BANDAGES/DRESSINGS) ×1
DERMABOND ADVANCED .7 DNX12 (GAUZE/BANDAGES/DRESSINGS) ×1 IMPLANT
DIFFUSER DRILL AIR PNEUMATIC (MISCELLANEOUS) ×2 IMPLANT
DRAPE NEUROLOGICAL W/INCISE (DRAPES) ×2 IMPLANT
DRAPE SURG 17X23 STRL (DRAPES) IMPLANT
DRAPE WARM FLUID 44X44 (DRAPE) ×2 IMPLANT
DRILL WIRE PASS 1.3MM (BIT) ×2
ELECT CAUTERY BLADE 6.4 (BLADE) ×2 IMPLANT
ELECT REM PT RETURN 9FT ADLT (ELECTROSURGICAL) ×2
ELECTRODE REM PT RTRN 9FT ADLT (ELECTROSURGICAL) ×1 IMPLANT
GAUZE SPONGE 4X4 12PLY STRL (GAUZE/BANDAGES/DRESSINGS) IMPLANT
GAUZE SPONGE 4X4 12PLY STRL LF (GAUZE/BANDAGES/DRESSINGS) ×2 IMPLANT
GAUZE SPONGE 4X4 16PLY XRAY LF (GAUZE/BANDAGES/DRESSINGS) IMPLANT
GLOVE BIO SURGEON STRL SZ7 (GLOVE) ×2 IMPLANT
GLOVE BIO SURGEON STRL SZ8 (GLOVE) ×2 IMPLANT
GLOVE BIOGEL PI IND STRL 6.5 (GLOVE) ×3 IMPLANT
GLOVE BIOGEL PI IND STRL 7.0 (GLOVE) ×1 IMPLANT
GLOVE BIOGEL PI INDICATOR 6.5 (GLOVE) ×3
GLOVE BIOGEL PI INDICATOR 7.0 (GLOVE) ×1
GLOVE EXAM NITRILE LRG STRL (GLOVE) IMPLANT
GLOVE EXAM NITRILE XL STR (GLOVE) IMPLANT
GLOVE EXAM NITRILE XS STR PU (GLOVE) IMPLANT
GLOVE INDICATOR 8.5 STRL (GLOVE) ×2 IMPLANT
GLOVE SURG SS PI 6.5 STRL IVOR (GLOVE) ×10 IMPLANT
GOWN STRL REUS W/ TWL LRG LVL3 (GOWN DISPOSABLE) ×2 IMPLANT
GOWN STRL REUS W/ TWL XL LVL3 (GOWN DISPOSABLE) ×1 IMPLANT
GOWN STRL REUS W/TWL 2XL LVL3 (GOWN DISPOSABLE) ×2 IMPLANT
GOWN STRL REUS W/TWL LRG LVL3 (GOWN DISPOSABLE) ×2
GOWN STRL REUS W/TWL XL LVL3 (GOWN DISPOSABLE) ×1
HEMOSTAT SURGICEL 2X14 (HEMOSTASIS) ×2 IMPLANT
KIT BASIN OR (CUSTOM PROCEDURE TRAY) ×2 IMPLANT
KIT DRAIN CSF ACCUDRAIN (MISCELLANEOUS) ×2 IMPLANT
KIT TURNOVER KIT B (KITS) ×2 IMPLANT
NEEDLE HYPO 25X1 1.5 SAFETY (NEEDLE) ×2 IMPLANT
NS IRRIG 1000ML POUR BTL (IV SOLUTION) ×4 IMPLANT
OIL CARTRIDGE MAESTRO DRILL (MISCELLANEOUS) ×2
PACK CRANIOTOMY CUSTOM (CUSTOM PROCEDURE TRAY) ×2 IMPLANT
PAD ARMBOARD 7.5X6 YLW CONV (MISCELLANEOUS) ×2 IMPLANT
PATTIES SURGICAL .25X.25 (GAUZE/BANDAGES/DRESSINGS) IMPLANT
PATTIES SURGICAL .5 X.5 (GAUZE/BANDAGES/DRESSINGS) IMPLANT
PATTIES SURGICAL .5 X3 (DISPOSABLE) IMPLANT
PATTIES SURGICAL 1X1 (DISPOSABLE) IMPLANT
PIN MAYFIELD SKULL DISP (PIN) IMPLANT
PLATE 1.5  2HOLE LNG NEURO (Plate) ×2 IMPLANT
PLATE 1.5 2HOLE LNG NEURO (Plate) ×2 IMPLANT
PLATE 1.5/0.5 18.5MM BURR HOLE (Plate) ×4 IMPLANT
SCREW SELF DRILL HT 1.5/4MM (Screw) ×16 IMPLANT
SPONGE NEURO XRAY DETECT 1X3 (DISPOSABLE) IMPLANT
SPONGE SURGIFOAM ABS GEL 100 (HEMOSTASIS) ×2 IMPLANT
STAPLER SKIN PROX WIDE 3.9 (STAPLE) IMPLANT
STAPLER VISISTAT 35W (STAPLE) ×2 IMPLANT
SUT ETHILON 3 0 FSL (SUTURE) IMPLANT
SUT NURALON 4 0 TR CR/8 (SUTURE) ×6 IMPLANT
SUT VIC AB 2-0 CT1 18 (SUTURE) ×4 IMPLANT
SYR CONTROL 10ML LL (SYRINGE) ×2 IMPLANT
TAPE CLOTH SURG 4X10 WHT LF (GAUZE/BANDAGES/DRESSINGS) ×2 IMPLANT
TOWEL GREEN STERILE (TOWEL DISPOSABLE) ×2 IMPLANT
TOWEL GREEN STERILE FF (TOWEL DISPOSABLE) ×2 IMPLANT
TRAY FOLEY CATH 14FR (SET/KITS/TRAYS/PACK) ×2 IMPLANT
TRAY FOLEY MTR SLVR 16FR STAT (SET/KITS/TRAYS/PACK) ×2 IMPLANT
TUBE CONNECTING 20X1/4 (TUBING) ×2 IMPLANT
UNDERPAD 30X30 (UNDERPADS AND DIAPERS) IMPLANT
WATER STERILE IRR 1000ML POUR (IV SOLUTION) ×2 IMPLANT

## 2017-12-01 NOTE — ED Notes (Signed)
Pt cleaned of urine which pt state just happenmed.

## 2017-12-01 NOTE — Transfer of Care (Signed)
Immediate Anesthesia Transfer of Care Note  Patient: Frank [name]  Procedure(s) Performed: Trudee Kuster Holes CRANIOTOMY HEMATOMA EVACUATION SUBDURAL (Left Head)  Patient Location: PACU  Anesthesia Type:General  Level of Consciousness: awake, alert , oriented and patient cooperative  Airway & Oxygen Therapy: Patient Spontanous Breathing and Patient connected to nasal cannula oxygen  Post-op Assessment: Report given to RN and Post -op Vital signs reviewed and stable  Post vital signs: Reviewed and stable  Last Vitals:  Vitals Value Taken Time  BP 151/74 12/01/2017  2:46 PM  Temp    Pulse 103 12/01/2017  2:48 PM  Resp 19 12/01/2017  2:48 PM  SpO2 96 % 12/01/2017  2:48 PM  Vitals shown include unvalidated device data.  Last Pain:  Vitals:   12/01/17 1004  TempSrc:   PainSc: 0-No pain         Complications: No apparent anesthesia complications

## 2017-12-01 NOTE — Progress Notes (Signed)
Subjective: Patient reports Doing well no significant headache still with clumsiness and altered mental status.  Objective: Vital signs in last 24 hours: Temp:  [98.2 F (36.8 C)-98.6 F (37 C)] 98.2 F (36.8 C) (05/22 0906) Pulse Rate:  [64-81] 64 (05/22 0906) Resp:  [16-18] 16 (05/22 0906) BP: (133-156)/(65-97) 135/66 (05/22 0906) SpO2:  [95 %-100 %] 98 % (05/22 0906) Weight:  [90.7 kg (200 lb)] 90.7 kg (200 lb) (05/21 1747)  Intake/Output from previous day: No intake/output data recorded. Intake/Output this shift: No intake/output data recorded.  Patient is awake alert and bilateral decreased coordination left greater than right  Lab Results: Recent Labs    11/30/17 1756 12/01/17 0330  WBC 6.4 6.8  HGB 11.3* 10.0*  HCT 36.8* 32.0*  PLT 179 141*   BMET Recent Labs    11/30/17 1756  NA 141  K 4.4  CL 106  CO2 27  GLUCOSE 167*  BUN 26*  CREATININE 1.21  CALCIUM 9.2    Studies/Results: Ct Head Wo Contrast  Result Date: 11/30/2017 CLINICAL DATA:  Subdural hematoma follow-up EXAM: CT HEAD WITHOUT CONTRAST TECHNIQUE: Contiguous axial images were obtained from the base of the skull through the vertex without intravenous contrast. COMPARISON:  10/25/2017 FINDINGS: Brain: There bilateral mixed density, predominantly hypodense, subdural hematomas. Left-sided collection has undergone some degree of redistribution but the size is unchanged. Thickness of the right-sided collection has slightly decreased, now measuring approximately 8 mm, previously 12 mm. No intraparenchymal hemorrhage. Old right caudate head lacunar infarct. Vascular: No hyperdense vessel or unexpected vascular calcification. Skull: Right frontal and left frontal and parietal burr holes. Sinuses/Orbits: No fluid levels or advanced mucosal thickening of the visualized paranasal sinuses. No mastoid or middle ear effusion. The orbits are normal. IMPRESSION: 1. Slight redistribution of blood within the left-sided  mixed age subdural hematoma, but overall unchanged in size. Slightly decreased rightward midline shift measuring 3 mm. 2. Decreased size of right subdural hematoma. Electronically Signed   By: Ulyses Jarred M.D.   On: 11/30/2017 20:23    Assessment/Plan: 77 year old gentleman presents to the ER last night with a CT scan showing worsening left-sided subacute subdural hematoma. Due to patient's clinical deterioration of the last 2 weeks worsening subdural and imaging of recommended left-sided craniotomy for evacuation. I've extensively gone over the risks and benefits of the procedure with him as well as perioperative course expectations of outcome and alternatives of surgery and he understands and agrees to proceed forward.  LOS: 1 day     Taygen Newsome P 12/01/2017, 9:49 AM

## 2017-12-01 NOTE — ED Notes (Signed)
Consent signed and at bedsdie.

## 2017-12-01 NOTE — Op Note (Signed)
Preoperative diagnosis: Left frontoparietal subacute subdural hematoma  Postoperative diagnosis: Same  Procedure: left frontoparietal craniotomy for evacuation of a subacute subdural hematoma  Surgeon: Dominica Severin Rachella Basden   assistant: Glenford Peers  Anesthesia: Gen.  EBL: Minimal  History of present illness: 77 year old gentleman who's had history of a subacute subdural hematoma from a work-related injury underwent burr hole craniectomy do well initially however start deteriorating follow up CT scan showed worsening of his left-sided subdural fluid collection with increased mass effect so recommend a craniotomy for evacuation. I extensively went over the risks and benefits of that operation with him as well as perioperative course expectations of outcome and alternatives of surgery and he has family have agreed to proceed forward.  Operative procedure: Patient brought into the or was induced on general anesthesia positioned supine with a shoulder bump under his left shoulder his head turned the right exposing the left frontoparietal area. His old incision were exposed eyes head was shaved prepped and draped in routine sterile fashion then I single linear incision connected the 2 incisions exposed he'll burr hole sites and connected the 2 bur holes up with [place] small craniotomy flap. I then incised the dura which did appear tense in a cruciate fashion some thick subdural membrane was initially incised to get the dura and then we'll identified multiple different layers of membranes that were all lysed and removed exposing medication. Cortical surface to the undersurface of the dura. I copiously irrigated all subdural fluid out coagulate some bridging veins and then I placed a drain in the form of a ventricular catheter and reapproximated the dura with interrupted Nurolon's replace the bone flap with the Lorenz plating system and closed the scalp with interrupted Vicryls in the galea and staples in the skin.  Wound was dressed patient was asked paracentral recovered in stable condition. At the end the case on it counts sponge counts were correct.

## 2017-12-01 NOTE — ED Notes (Signed)
Pt updated with information from or.

## 2017-12-01 NOTE — Anesthesia Preprocedure Evaluation (Addendum)
Anesthesia Evaluation  Patient identified by MRN, date of birth, ID band Patient awake    Reviewed: Allergy & Precautions, NPO status , Patient's Chart, lab work & pertinent test results  Airway Mallampati: II  TM Distance: >3 FB Neck ROM: Full    Dental no notable dental hx.    Pulmonary neg pulmonary ROS, former smoker,    Pulmonary exam normal breath sounds clear to auscultation       Cardiovascular hypertension, Normal cardiovascular exam Rhythm:Regular Rate:Normal     Neuro/Psych TBI Brain: There bilateral mixed density, predominantly hypodense, subdural hematomas. Left-sided collection has undergone some degree of redistribution but the size is unchanged. Thickness of the right-sided collection has slightly decreased, now measuring approximately 8 mm, previously 12 mm. No intraparenchymal hemorrhage. Old right caudate head lacunar infarct.  Vascular: No hyperdense vessel or unexpected vascular calcification.  Skull: Right frontal and left frontal and parietal burr holes.  Sinuses/Orbits: No fluid levels or advanced mucosal thickening of the visualized paranasal sinuses. No mastoid or middle ear effusion. The orbits are normal.  IMPRESSION: 1. Slight redistribution of blood within the left-sided mixed age subdural hematoma, but overall unchanged in size. Slightly decreased rightward midline shift measuring 3 mm. 2. Decreased size of right subdural hematoma. negative psych ROS   GI/Hepatic negative GI ROS, Neg liver ROS,   Endo/Other  diabetes  Renal/GU negative Renal ROS  negative genitourinary   Musculoskeletal negative musculoskeletal ROS (+)   Abdominal   Peds negative pediatric ROS (+)  Hematology  (+) anemia ,   Anesthesia Other Findings   Reproductive/Obstetrics negative OB ROS                            Anesthesia Physical Anesthesia Plan  ASA:  III  Anesthesia Plan: General   Post-op Pain Management:    Induction: Intravenous  PONV Risk Score and Plan: 3 and Ondansetron, Dexamethasone and Treatment may vary due to age or medical condition  Airway Management Planned: Oral ETT  Additional Equipment:   Intra-op Plan:   Post-operative Plan: Possible Post-op intubation/ventilation  Informed Consent: I have reviewed the patients History and Physical, chart, labs and discussed the procedure including the risks, benefits and alternatives for the proposed anesthesia with the patient or authorized representative who has indicated his/her understanding and acceptance.   Dental advisory given  Plan Discussed with: CRNA and Surgeon  Anesthesia Plan Comments:         Anesthesia Quick Evaluation

## 2017-12-01 NOTE — ED Notes (Signed)
Anesthesia charge contacted to confirm no morning medication. CBG requested.

## 2017-12-02 ENCOUNTER — Encounter (HOSPITAL_COMMUNITY): Payer: Self-pay | Admitting: Neurosurgery

## 2017-12-02 ENCOUNTER — Inpatient Hospital Stay (HOSPITAL_COMMUNITY): Payer: Worker's Compensation

## 2017-12-02 LAB — BASIC METABOLIC PANEL
Anion gap: 8 (ref 5–15)
BUN: 22 mg/dL — ABNORMAL HIGH (ref 6–20)
CO2: 27 mmol/L (ref 22–32)
Calcium: 8.8 mg/dL — ABNORMAL LOW (ref 8.9–10.3)
Chloride: 102 mmol/L (ref 101–111)
Creatinine, Ser: 1.11 mg/dL (ref 0.61–1.24)
GFR calc Af Amer: >60 mL/min (ref >60)
GFR calc non Af Amer: >60 mL/min (ref >60)
Glucose, Bld: 230 mg/dL — ABNORMAL HIGH (ref 65–99)
Potassium: 4.7 mmol/L (ref 3.5–5.1)
Sodium: 137 mmol/L (ref 135–145)

## 2017-12-02 LAB — MRSA PCR SCREENING: MRSA by PCR: NEGATIVE

## 2017-12-02 MED FILL — Thrombin For Soln 20000 Unit: CUTANEOUS | Qty: 1 | Status: AC

## 2017-12-02 NOTE — Anesthesia Postprocedure Evaluation (Signed)
Anesthesia Post Note  Patient: Frank [name]  Procedure(s) Performed: Trudee Kuster Holes CRANIOTOMY HEMATOMA EVACUATION SUBDURAL (Left Head)     Patient location during evaluation: PACU Anesthesia Type: General Level of consciousness: awake and alert Pain management: pain level controlled Vital Signs Assessment: post-procedure vital signs reviewed and stable Respiratory status: spontaneous breathing, nonlabored ventilation, respiratory function stable and patient connected to nasal cannula oxygen Cardiovascular status: blood pressure returned to baseline and stable Postop Assessment: no apparent nausea or vomiting Anesthetic complications: no    Last Vitals:  Vitals:   12/02/17 0700 12/02/17 0730  BP: 134/65 136/76  Pulse: 87 90  Resp: 13 12  Temp:    SpO2: 93% 97%    Last Pain:  Vitals:   12/02/17 0730  TempSrc:   PainSc: 5                  [name] S

## 2017-12-02 NOTE — Progress Notes (Signed)
Patient ID: Frank [name], male   DOB: [birth year], 77 y.o.   MRN: [id number] Subjective: Patient reports mild headache, hands feel better  Objective: Vital signs in last 24 hours: Temp:  [97.5 F (36.4 C)-98.2 F (36.8 C)] 97.7 F (36.5 C) (05/23 0400) Pulse Rate:  [64-105] 90 (05/23 0730) Resp:  [9-23] 12 (05/23 0730) BP: (97-164)/(51-109) 136/76 (05/23 0730) SpO2:  [92 %-100 %] 97 % (05/23 0730) Weight:  [90.7 kg (200 lb)-92.3 kg (203 lb 7.8 oz)] 92.3 kg (203 lb 7.8 oz) (05/22 2200)  Intake/Output from previous day: 05/22 0701 - 05/23 0700 In: 1773.8 [I.V.:1273.8; IV Piggyback:500] Out: 1509 [Urine:1225; Drains:184; Blood:100] Intake/Output this shift: No intake/output data recorded.  Neurologic: Grossly normal, MAE, no aphasia  Lab Results: Lab Results  Component Value Date   WBC 8.5 12/01/2017   HGB 10.9 (L) 12/01/2017   HCT 35.2 (L) 12/01/2017   MCV 83.8 12/01/2017   PLT 154 12/01/2017   Lab Results  Component Value Date   INR 0.98 08/04/2017   BMET Lab Results  Component Value Date   NA 137 12/02/2017   K 4.7 12/02/2017   CL 102 12/02/2017   CO2 27 12/02/2017   GLUCOSE 230 (H) 12/02/2017   BUN 22 (H) 12/02/2017   CREATININE 1.11 12/02/2017   CALCIUM 8.8 (L) 12/02/2017    Studies/Results: Ct Head Wo Contrast  Result Date: 12/02/2017 CLINICAL DATA:  Follow-up subdural hematoma. EXAM: CT HEAD WITHOUT CONTRAST TECHNIQUE: Contiguous axial images were obtained from the base of the skull through the vertex without intravenous contrast. COMPARISON:  None. CT HEAD Nov 30, 2017 FINDINGS: BRAIN: Interval LEFT craniotomy for placement of extra-axial surgical drain. LEFT holohemispheric subdural hematoma was 17 mm, now 7 mm with small volume extra-axial pneumocephalus. Stable mixed density RIGHT holohemispheric subdural hematoma measuring to 10 mm. No intraparenchymal hemorrhage, mass effect or midline shift. Old RIGHT caudate head lacunar infarct. No parenchymal  brain volume loss for age. No acute large vascular territory infarcts. Basal cisterns are patent. VASCULAR: Moderate calcific atherosclerosis of the carotid siphons. SKULL: New LEFT frontal craniotomy with multiple burr holes. Old RIGHT frontal burr hole. Scalp soft tissue swelling with LEFT skin staples. SINUSES/ORBITS: Mild lobulated paranasal sinus mucosal thickening without air-fluid levels. Included mastoid air cells are well aerated.The included ocular globes and orbital contents are non-suspicious. Status post bilateral ocular lens implants. OTHER: None. IMPRESSION: 1. Interval LEFT craniotomy and placement of extra-axial surgical drain. 7 mm LEFT subdural hematoma was 17 mm. 2. Stable 1 cm mixed density RIGHT subdural hematoma. Electronically Signed   By: Elon Alas M.D.   On: 12/02/2017 05:36   Ct Head Wo Contrast  Result Date: 11/30/2017 CLINICAL DATA:  Subdural hematoma follow-up EXAM: CT HEAD WITHOUT CONTRAST TECHNIQUE: Contiguous axial images were obtained from the base of the skull through the vertex without intravenous contrast. COMPARISON:  10/25/2017 FINDINGS: Brain: There bilateral mixed density, predominantly hypodense, subdural hematomas. Left-sided collection has undergone some degree of redistribution but the size is unchanged. Thickness of the right-sided collection has slightly decreased, now measuring approximately 8 mm, previously 12 mm. No intraparenchymal hemorrhage. Old right caudate head lacunar infarct. Vascular: No hyperdense vessel or unexpected vascular calcification. Skull: Right frontal and left frontal and parietal burr holes. Sinuses/Orbits: No fluid levels or advanced mucosal thickening of the visualized paranasal sinuses. No mastoid or middle ear effusion. The orbits are normal. IMPRESSION: 1. Slight redistribution of blood within the left-sided mixed age subdural hematoma, but overall unchanged in size.  Slightly decreased rightward midline shift measuring 3 mm. 2.  Decreased size of right subdural hematoma. Electronically Signed   By: Ulyses Jarred M.D.   On: 11/30/2017 20:23    Assessment/Plan: Doing well, mobilize  Estimated body mass index is 29.2 kg/m as calculated from the following:   Height as of this encounter: 5\' 10"  (1.778 m).   Weight as of this encounter: 92.3 kg (203 lb 7.8 oz).    LOS: 2 days    Frank [name] S 12/02/2017, 7:56 AM

## 2017-12-02 NOTE — Progress Notes (Signed)
Results for JOHNNEY, SCARLATA" (MRN [id number]) as of 12/02/2017 12:48  Ref. Range 12/01/2017 09:19 12/01/2017 11:49 12/01/2017 16:03 12/01/2017 17:48 12/01/2017 21:10  Glucose-Capillary Latest Ref Range: 65 - 99 mg/dL 155 (H) 162 (H) 244 (H) 212 (H) 213 (H)  Noted that blood sugars have been greater than 180 mg/dl.  Recommend adding Novolog SENSITIVE correction scale TID & HS. Check blood sugars TID & HS while in the hospital.  Harvel Ricks RN BSN CDE Diabetes Coordinator Pager: [phone]  8am-5pm

## 2017-12-02 NOTE — Evaluation (Signed)
Physical Therapy Evaluation Patient Details Name: Frank [name] MRN: [id number] DOB: 06-Jul-1941 Today's Date: 12/02/2017   History of Present Illness  Patient is a 77 y/o male admitted with worsening confusion & deterioration of function over 2 weeks time.  He has had bilateral SDH with previous bilateral (2 on L side) burr hole evacuation 08/04/17.  He is now s/p left frontoparietal craniotomy for evacuation of a subacute subdural hematoma  Clinical Impression  Patient presents with decreased independence with mobility due to h/o decreased balance, decreased safety awareness and decreased gait.  Feel he can benefit from skilled PT in the acute setting to allow return home with family support and follow up HHPT.     Follow Up Recommendations Home health PT;Supervision/Assistance - 24 hour    Equipment Recommendations  None recommended by PT    Recommendations for Other Services       Precautions / Restrictions Precautions Precautions: Fall Precaution Comments: EVD (RN clamped prior to mobility)      Mobility  Bed Mobility               General bed mobility comments: up in chair  Transfers Overall transfer level: Needs assistance Equipment used: Rolling walker (2 wheeled) Transfers: Sit to/from Stand Sit to Stand: Min guard         General transfer comment: with UE support, assist for safety  Ambulation/Gait Ambulation/Gait assistance: Min guard;+2 safety/equipment(assist for IV poles with EVD) Ambulation Distance (Feet): 150 Feet Assistive device: Rolling walker (2 wheeled) Gait Pattern/deviations: Step-through pattern;Decreased stride length;Shuffle;Trunk flexed     General Gait Details: stays back in walker and maintains shorter step length with shuffling pattern daughter says is typical of his premorbid gait  Stairs            Wheelchair Mobility    Modified Rankin (Stroke Patients Only)       Balance Overall balance assessment: Needs  assistance Sitting-balance support: Bilateral upper extremity supported Sitting balance-Leahy Scale: Good     Standing balance support: Bilateral upper extremity supported Standing balance-Leahy Scale: Poor Standing balance comment: UE support for balance                             Pertinent Vitals/Pain Pain Assessment: 0-10 Pain Score: 4  Pain Location: headache on left Pain Descriptors / Indicators: Headache Pain Intervention(s): Repositioned;Monitored during session    Home Living Family/patient expects to be discharged to:: Private residence Living Arrangements: Spouse/significant other Available Help at Discharge: Family;Available 24 hours/day Type of Home: House Home Access: Stairs to enter Entrance Stairs-Rails: Can reach both Entrance Stairs-Number of Steps: 4 Home Layout: One level Home Equipment: Grab bars - tub/shower;Grab bars - toilet;Walker - 4 wheels;Shower seat;Adaptive equipment      Prior Function Level of Independence: Independent with assistive device(s)               Hand Dominance   Dominant Hand: Right    Extremity/Trunk Assessment   Upper Extremity Assessment Upper Extremity Assessment: Overall WFL for tasks assessed    Lower Extremity Assessment Lower Extremity Assessment: Overall WFL for tasks assessed       Communication   Communication: No difficulties  Cognition Arousal/Alertness: Awake/alert Behavior During Therapy: WFL for tasks assessed/performed Overall Cognitive Status: Within Functional Limits for tasks assessed  General Comments General comments (skin integrity, edema, etc.): brother and daughter in room; reporting improved function from prior to surgery    Exercises     Assessment/Plan    PT Assessment Patient needs continued PT services  PT Problem List Decreased balance;Decreased knowledge of use of DME;Decreased mobility;Decreased safety  awareness;Decreased knowledge of precautions       PT Treatment Interventions DME instruction;Functional mobility training;Balance training;Patient/family education;Gait training;Therapeutic activities;Stair training;Therapeutic exercise;Neuromuscular re-education    PT Goals (Current goals can be found in the Care Plan section)  Acute Rehab PT Goals Patient Stated Goal: to return home PT Goal Formulation: With patient/family Time For Goal Achievement: 12/16/17 Potential to Achieve Goals: Good    Frequency Min 3X/week   Barriers to discharge        Co-evaluation               AM-PAC PT "6 Clicks" Daily Activity  Outcome Measure Difficulty turning over in bed (including adjusting bedclothes, sheets and blankets)?: A Little Difficulty moving from lying on back to sitting on the side of the bed? : Unable Difficulty sitting down on and standing up from a chair with arms (e.g., wheelchair, bedside commode, etc,.)?: Unable Help needed moving to and from a bed to chair (including a wheelchair)?: A Little Help needed walking in hospital room?: A Little Help needed climbing 3-5 steps with a railing? : A Lot 6 Click Score: 13    End of Session Equipment Utilized During Treatment: Gait belt Activity Tolerance: Patient tolerated treatment well Patient left: with call bell/phone within reach;in chair;with family/visitor present   PT Visit Diagnosis: Other abnormalities of gait and mobility (R26.89)    Time: 6606-0045 PT Time Calculation (min) (ACUTE ONLY): 25 min   Charges:   PT Evaluation $PT Eval Moderate Complexity: 1 Mod PT Treatments $Gait Training: 8-22 mins   PT G CodesMagda Kiel, [place] [phone] 12/02/2017   Reginia Naas 12/02/2017, 1:47 PM

## 2017-12-03 MED ORDER — LEVETIRACETAM 500 MG PO TABS: 500.0000 mg | ORAL_TABLET | Freq: Two times a day (BID) | ORAL | 0 refills | Status: DC

## 2017-12-03 NOTE — Discharge Summary (Signed)
Physician Discharge Summary  Patient ID: Frank [name] MRN: [id number] DOB/AGE: 1941-05-02 77 y.o.  Admit date: 11/30/2017 Discharge date: 12/03/2017  Admission Diagnoses: Left frontoparietal subacute subdural hematoma   Discharge Diagnoses: same   Discharged Condition: good  Hospital Course: The patient was admitted on 11/30/2017 and taken to the operating room where the patient underwent craniotomy. The patient tolerated the procedure well and was taken to the recovery room and then to the ICU in stable condition. The hospital course was routine. There were no complications. The wound remained clean dry and intact. Pt had appropriate head soreness. No complaints of new pain or new N/T/W. The patient remained afebrile with stable vital signs, and tolerated a regular diet. The patient continued to increase activities, and pain was well controlled with oral pain medications.   Consults: None  Significant Diagnostic Studies:  Results for orders placed or performed during the hospital encounter of 11/30/17  MRSA PCR Screening  Result Value Ref Range   MRSA by PCR NEGATIVE NEGATIVE  Basic metabolic panel  Result Value Ref Range   Sodium 141 135 - 145 mmol/L   Potassium 4.4 3.5 - 5.1 mmol/L   Chloride 106 101 - 111 mmol/L   CO2 27 22 - 32 mmol/L   Glucose, Bld 167 (H) 65 - 99 mg/dL   BUN 26 (H) 6 - 20 mg/dL   Creatinine, Ser 1.21 0.61 - 1.24 mg/dL   Calcium 9.2 8.9 - 10.3 mg/dL   GFR calc non Af Amer 55 (L) >60 mL/min   GFR calc Af Amer >60 >60 mL/min   Anion gap 8 5 - 15  CBC with Differential  Result Value Ref Range   WBC 6.4 4.0 - 10.5 K/uL   RBC 4.37 4.22 - 5.81 MIL/uL   Hemoglobin 11.3 (L) 13.0 - 17.0 g/dL   HCT 36.8 (L) 39.0 - 52.0 %   MCV 84.2 78.0 - 100.0 fL   MCH 25.9 (L) 26.0 - 34.0 pg   MCHC 30.7 30.0 - 36.0 g/dL   RDW 14.0 11.5 - 15.5 %   Platelets 179 150 - 400 K/uL   Neutrophils Relative % 62 %   Neutro Abs 3.9 1.7 - 7.7 K/uL   Lymphocytes Relative 21 %    Lymphs Abs 1.4 0.7 - 4.0 K/uL   Monocytes Relative 12 %   Monocytes Absolute 0.8 0.1 - 1.0 K/uL   Eosinophils Relative 4 %   Eosinophils Absolute 0.3 0.0 - 0.7 K/uL   Basophils Relative 1 %   Basophils Absolute 0.1 0.0 - 0.1 K/uL   Immature Granulocytes 0 %   Abs Immature Granulocytes 0.0 0.0 - 0.1 K/uL  Urinalysis, Routine w reflex microscopic  Result Value Ref Range   Color, Urine YELLOW YELLOW   APPearance CLEAR CLEAR   Specific Gravity, Urine 1.013 1.005 - 1.030   pH 7.0 5.0 - 8.0   Glucose, UA NEGATIVE NEGATIVE mg/dL   Hgb urine dipstick SMALL (A) NEGATIVE   Bilirubin Urine NEGATIVE NEGATIVE   Ketones, ur NEGATIVE NEGATIVE mg/dL   Protein, ur 30 (A) NEGATIVE mg/dL   Nitrite NEGATIVE NEGATIVE   Leukocytes, UA NEGATIVE NEGATIVE   RBC / HPF 0-5 0 - 5 RBC/hpf   WBC, UA 0-5 0 - 5 WBC/hpf   Bacteria, UA NONE SEEN NONE SEEN   Mucus PRESENT   CBC  Result Value Ref Range   WBC 6.8 4.0 - 10.5 K/uL   RBC 3.87 (L) 4.22 - 5.81 MIL/uL  Hemoglobin 10.0 (L) 13.0 - 17.0 g/dL   HCT 32.0 (L) 39.0 - 52.0 %   MCV 82.7 78.0 - 100.0 fL   MCH 25.8 (L) 26.0 - 34.0 pg   MCHC 31.3 30.0 - 36.0 g/dL   RDW 14.0 11.5 - 15.5 %   Platelets 141 (L) 150 - 400 K/uL  Glucose, capillary  Result Value Ref Range   Glucose-Capillary 162 (H) 65 - 99 mg/dL   Comment 1 Notify RN   Glucose, capillary  Result Value Ref Range   Glucose-Capillary 244 (H) 65 - 99 mg/dL  Glucose, capillary  Result Value Ref Range   Glucose-Capillary 212 (H) 65 - 99 mg/dL  Basic metabolic panel  Result Value Ref Range   Sodium 139 135 - 145 mmol/L   Potassium 4.1 3.5 - 5.1 mmol/L   Chloride 103 101 - 111 mmol/L   CO2 28 22 - 32 mmol/L   Glucose, Bld 248 (H) 65 - 99 mg/dL   BUN 21 (H) 6 - 20 mg/dL   Creatinine, Ser 1.18 0.61 - 1.24 mg/dL   Calcium 8.9 8.9 - 10.3 mg/dL   GFR calc non Af Amer 57 (L) >60 mL/min   GFR calc Af Amer >60 >60 mL/min   Anion gap 8 5 - 15  CBC  Result Value Ref Range   WBC 8.5 4.0 - 10.5  K/uL   RBC 4.20 (L) 4.22 - 5.81 MIL/uL   Hemoglobin 10.9 (L) 13.0 - 17.0 g/dL   HCT 35.2 (L) 39.0 - 52.0 %   MCV 83.8 78.0 - 100.0 fL   MCH 26.0 26.0 - 34.0 pg   MCHC 31.0 30.0 - 36.0 g/dL   RDW 13.8 11.5 - 15.5 %   Platelets 154 150 - 400 K/uL  Glucose, capillary  Result Value Ref Range   Glucose-Capillary 213 (H) 65 - 99 mg/dL  Basic metabolic panel  Result Value Ref Range   Sodium 137 135 - 145 mmol/L   Potassium 4.7 3.5 - 5.1 mmol/L   Chloride 102 101 - 111 mmol/L   CO2 27 22 - 32 mmol/L   Glucose, Bld 230 (H) 65 - 99 mg/dL   BUN 22 (H) 6 - 20 mg/dL   Creatinine, Ser 1.11 0.61 - 1.24 mg/dL   Calcium 8.8 (L) 8.9 - 10.3 mg/dL   GFR calc non Af Amer >60 >60 mL/min   GFR calc Af Amer >60 >60 mL/min   Anion gap 8 5 - 15  CBG monitoring, ED  Result Value Ref Range   Glucose-Capillary 155 (H) 65 - 99 mg/dL  Type and screen [place]  Result Value Ref Range   ABO/RH(D) B POS    Antibody Screen NEG    Sample Expiration      12/04/2017 Performed at [facility], [address], [place], [place] [address]     Ct Head Wo Contrast  Result Date: 12/02/2017 CLINICAL DATA:  Follow-up subdural hematoma. EXAM: CT HEAD WITHOUT CONTRAST TECHNIQUE: Contiguous axial images were obtained from the base of the skull through the vertex without intravenous contrast. COMPARISON:  None. CT HEAD Nov 30, 2017 FINDINGS: BRAIN: Interval LEFT craniotomy for placement of extra-axial surgical drain. LEFT holohemispheric subdural hematoma was 17 mm, now 7 mm with small volume extra-axial pneumocephalus. Stable mixed density RIGHT holohemispheric subdural hematoma measuring to 10 mm. No intraparenchymal hemorrhage, mass effect or midline shift. Old RIGHT caudate head lacunar infarct. No parenchymal brain volume loss for age. No  acute large vascular territory infarcts. Basal cisterns are patent. VASCULAR: Moderate calcific atherosclerosis of the carotid siphons. SKULL: New LEFT frontal  craniotomy with multiple burr holes. Old RIGHT frontal burr hole. Scalp soft tissue swelling with LEFT skin staples. SINUSES/ORBITS: Mild lobulated paranasal sinus mucosal thickening without air-fluid levels. Included mastoid air cells are well aerated.The included ocular globes and orbital contents are non-suspicious. Status post bilateral ocular lens implants. OTHER: None. IMPRESSION: 1. Interval LEFT craniotomy and placement of extra-axial surgical drain. 7 mm LEFT subdural hematoma was 17 mm. 2. Stable 1 cm mixed density RIGHT subdural hematoma. Electronically Signed   By: Elon Alas M.D.   On: 12/02/2017 05:36   Ct Head Wo Contrast  Result Date: 11/30/2017 CLINICAL DATA:  Subdural hematoma follow-up EXAM: CT HEAD WITHOUT CONTRAST TECHNIQUE: Contiguous axial images were obtained from the base of the skull through the vertex without intravenous contrast. COMPARISON:  10/25/2017 FINDINGS: Brain: There bilateral mixed density, predominantly hypodense, subdural hematomas. Left-sided collection has undergone some degree of redistribution but the size is unchanged. Thickness of the right-sided collection has slightly decreased, now measuring approximately 8 mm, previously 12 mm. No intraparenchymal hemorrhage. Old right caudate head lacunar infarct. Vascular: No hyperdense vessel or unexpected vascular calcification. Skull: Right frontal and left frontal and parietal burr holes. Sinuses/Orbits: No fluid levels or advanced mucosal thickening of the visualized paranasal sinuses. No mastoid or middle ear effusion. The orbits are normal. IMPRESSION: 1. Slight redistribution of blood within the left-sided mixed age subdural hematoma, but overall unchanged in size. Slightly decreased rightward midline shift measuring 3 mm. 2. Decreased size of right subdural hematoma. Electronically Signed   By: Ulyses Jarred M.D.   On: 11/30/2017 20:23    Antibiotics:  Anti-infectives (From admission, onward)   Start      Dose/Rate Route Frequency Ordered Stop   12/01/17 2300  ceFAZolin (ANCEF) IVPB 2g/100 mL premix     2 g 200 mL/hr over 30 Minutes Intravenous Every 8 hours 12/01/17 2216 12/02/17 0700   12/01/17 1331  bacitracin 50,000 Units in sodium chloride 0.9 % 500 mL irrigation  Status:  Discontinued       As needed 12/01/17 1331 12/01/17 1440   12/01/17 1200  vancomycin (VANCOCIN) IVPB 1000 mg/200 mL premix     1,000 mg 200 mL/hr over 60 Minutes Intravenous  Once 12/01/17 1156 12/01/17 1319   12/01/17 1200  vancomycin (VANCOCIN) 1-5 GM/200ML-% IVPB    Note to Pharmacy:  Grace Blight   : cabinet override      12/01/17 1200 12/01/17 1219      Discharge Exam: Blood pressure (!) 161/119, pulse 69, temperature (!) 97.5 F (36.4 C), temperature source Axillary, resp. rate 13, height 5\' 10"  (1.778 m), weight 92.3 kg (203 lb 7.8 oz), SpO2 97 %. Neurologic: Grossly normal Ambulating and voiding well  Discharge Medications:   Allergies as of 12/03/2017      Reactions   Lisinopril Cough   Penicillins    REACTION: unspecified Allergy as a child.  Patient does not remember reaction.  Has had cephalasporins without problems.   Sulfamethoxazole    REACTION: as child      Medication List    TAKE these medications   acetaminophen 500 MG tablet Commonly known as:  TYLENOL Take 500 mg by mouth every 6 (six) hours as needed for mild pain.   amLODipine 10 MG tablet Commonly known as:  NORVASC Take 1 tablet (10 mg total) by mouth daily.   atorvastatin  20 MG tablet Commonly known as:  LIPITOR TAKE ONE-HALF TABLET BY  MOUTH DAILY What changed:    how much to take  how to take this  when to take this   Carbonyl Iron 25 MG Tabs Take 25 mg by mouth daily.   cetirizine 10 MG tablet Commonly known as:  ZYRTEC Take 10 mg by mouth at bedtime.   Cholecalciferol 2000 units Tabs Take 1 tablet (2,000 Units total) by mouth daily.   doxazosin 2 MG tablet Commonly known as:  CARDURA Take 1 tablet  (2 mg total) by mouth daily. What changed:  when to take this   doxazosin 4 MG tablet Commonly known as:  CARDURA TAKE ONE-HALF TABLET BY  MOUTH AT BEDTIME What changed:  Another medication with the same name was changed. Make sure you understand how and when to take each.   FLUoxetine 20 MG capsule Commonly known as:  PROZAC Take 1 capsule (20 mg total) by mouth daily.   fluticasone 50 MCG/ACT nasal spray Commonly known as:  FLONASE Place 1 spray into both nostrils daily as needed for allergies or rhinitis.   glipiZIDE 10 MG 24 hr tablet Commonly known as:  GLUCOTROL XL Take 1 tablet (10 mg total) by mouth daily.   levETIRAcetam 500 MG tablet Commonly known as:  KEPPRA Take 1 tablet (500 mg total) by mouth 2 (two) times daily. What changed:  Another medication with the same name was added. Make sure you understand how and when to take each.   levETIRAcetam 500 MG tablet Commonly known as:  KEPPRA Take 1 tablet (500 mg total) by mouth 2 (two) times daily. What changed:  You were already taking a medication with the same name, and this prescription was added. Make sure you understand how and when to take each.   metFORMIN 750 MG 24 hr tablet Commonly known as:  GLUCOPHAGE-XR Take 1 tablet (750 mg total) by mouth daily with breakfast.   oxybutynin 5 MG tablet Commonly known as:  DITROPAN Take 1 tablet (5 mg total) by mouth 2 (two) times daily.   pantoprazole 40 MG tablet Commonly known as:  PROTONIX Take 1 tablet (40 mg total) by mouth daily.   telmisartan 40 MG tablet Commonly known as:  MICARDIS Take 1 tablet (40 mg total) by mouth daily.       Disposition: home   Final Dx: craniotomy for sdh  Discharge Instructions    Call MD for:  difficulty breathing, headache or visual disturbances   Complete by:  As directed    Call MD for:  extreme fatigue   Complete by:  As directed    Call MD for:  hives   Complete by:  As directed    Call MD for:  persistant  dizziness or light-headedness   Complete by:  As directed    Call MD for:  persistant nausea and vomiting   Complete by:  As directed    Call MD for:  redness, tenderness, or signs of infection (pain, swelling, redness, odor or green/yellow discharge around incision site)   Complete by:  As directed    Call MD for:  severe uncontrolled pain   Complete by:  As directed    Diet - low sodium heart healthy   Complete by:  As directed    Driving Restrictions   Complete by:  As directed    No driving   Increase activity slowly   Complete by:  As directed  Follow-up Information    [name], MD. Schedule an appointment as soon as possible for a visit in 1 week(s).   Specialty:  Neurosurgery Contact information: [address] [place] [address] [place] [address] [phone]            Signed: Ocie Cornfield [facility] 12/03/2017, 10:20 AM

## 2017-12-03 NOTE — Care Management Important Message (Signed)
Important Message  Patient Details  Name: Frank [name] MRN: [id number] Date of Birth: 02-09-1941   Medicare Important Message Given:  Yes    Dymphna Wadley P Aubrianna Orchard 12/03/2017, 3:26 PM

## 2017-12-03 NOTE — Progress Notes (Signed)
Physical Therapy Treatment Patient Details Name: Frank [name] MRN: [id number] DOB: 06/16/1941 Today's Date: 12/03/2017    History of Present Illness Patient is a 77 y/o male admitted with worsening confusion & deterioration of function over 2 weeks time.  He has had bilateral SDH with previous bilateral (2 on L side) burr hole evacuation 08/04/17.  He is now s/p left frontoparietal craniotomy for evacuation of a subacute subdural hematoma    PT Comments    Pt ambulating and transitioning up and down with less assistance today.  He was able to practice stairs with minimal assistance simulating home entry and wife present reporting his walking quality is much better than when he came into the hospital.  They are both confident they can manage if d/c home today.  PT to follow acutely until d/c confirmed.     Follow Up Recommendations  Home health PT;Supervision/Assistance - 24 hour     Equipment Recommendations  None recommended by PT(resume)    Recommendations for Other Services   NA     Precautions / Restrictions Precautions Precautions: Fall Precaution Comments: no more drain    Mobility  Bed Mobility               General bed mobility comments: Pt was OOB in the recliner chair.   Transfers Overall transfer level: Needs assistance Equipment used: Rolling walker (2 wheeled) Transfers: Sit to/from Stand Sit to Stand: Supervision         General transfer comment: supervision for safety  Ambulation/Gait Ambulation/Gait assistance: Min guard Ambulation Distance (Feet): 130 Feet Assistive device: Rolling walker (2 wheeled) Gait Pattern/deviations: Step-through pattern;Shuffle;Decreased stride length     General Gait Details: Pt with short, choppy steps that improved with increased gait distance, verbal cues for closer proximity to RW and to keep feet inside of RW during truns.  He is used to a rollator.     Stairs Stairs: Yes Stairs assistance: Min  assist Stair Management: One rail Right;Step to pattern;Forwards(hand held assist on the left.  ) Number of Stairs: 4 General stair comments: I think pt could have been supervision or min guard had he had both rails to reach at once like he has at home.  Min hand held assist provided and step to pattern preformed.  Pt did well with control coming up and down.          Balance Overall balance assessment: Needs assistance Sitting-balance support: Feet supported;No upper extremity supported Sitting balance-Leahy Scale: Good     Standing balance support: Bilateral upper extremity supported;No upper extremity supported;Single extremity supported Standing balance-Leahy Scale: Fair                              Cognition Arousal/Alertness: Awake/alert Behavior During Therapy: WFL for tasks assessed/performed Overall Cognitive Status: Within Functional Limits for tasks assessed                                 General Comments: Not specifically tested, but appears WNL             Pertinent Vitals/Pain Pain Assessment: No/denies pain           PT Goals (current goals can now be found in the care plan section) Acute Rehab PT Goals Patient Stated Goal: to return home today Progress towards PT goals: Progressing toward goals    Frequency  Min 3X/week      PT Plan Current plan remains appropriate       AM-PAC PT "6 Clicks" Daily Activity  Outcome Measure  Difficulty turning over in bed (including adjusting bedclothes, sheets and blankets)?: A Little Difficulty moving from lying on back to sitting on the side of the bed? : Unable Difficulty sitting down on and standing up from a chair with arms (e.g., wheelchair, bedside commode, etc,.)?: Unable Help needed moving to and from a bed to chair (including a wheelchair)?: A Little Help needed walking in hospital room?: A Little Help needed climbing 3-5 steps with a railing? : A Little 6 Click  Score: 14    End of Session Equipment Utilized During Treatment: Gait belt Activity Tolerance: Patient tolerated treatment well Patient left: in chair;with call bell/phone within reach;with family/visitor present   PT Visit Diagnosis: Other abnormalities of gait and mobility (R26.89)     Time: 2863-8177 PT Time Calculation (min) (ACUTE ONLY): 37 min  Charges:  $Gait Training: 23-37 mins          Shoshanna Mcquitty B. [place], [place], DPT [phone]            12/03/2017, 12:30 PM

## 2017-12-03 NOTE — Progress Notes (Signed)
Frank [name] discharged home per MD order.  Discharge instructions reviewed and discussed with the patient and family, all questions and concerns answered. Copy of instructions given to patient.  Allergies as of 12/03/2017      Reactions   Lisinopril Cough   Penicillins    REACTION: unspecified Allergy as a child.  Patient does not remember reaction.  Has had cephalasporins without problems.   Sulfamethoxazole    REACTION: as child      Medication List    TAKE these medications   acetaminophen 500 MG tablet Commonly known as:  TYLENOL Take 500 mg by mouth every 6 (six) hours as needed for mild pain.   amLODipine 10 MG tablet Commonly known as:  NORVASC Take 1 tablet (10 mg total) by mouth daily.   atorvastatin 20 MG tablet Commonly known as:  LIPITOR TAKE ONE-HALF TABLET BY  MOUTH DAILY What changed:    how much to take  how to take this  when to take this   Carbonyl Iron 25 MG Tabs Take 25 mg by mouth daily.   cetirizine 10 MG tablet Commonly known as:  ZYRTEC Take 10 mg by mouth at bedtime.   Cholecalciferol 2000 units Tabs Take 1 tablet (2,000 Units total) by mouth daily.   doxazosin 2 MG tablet Commonly known as:  CARDURA Take 1 tablet (2 mg total) by mouth daily. What changed:  when to take this   doxazosin 4 MG tablet Commonly known as:  CARDURA TAKE ONE-HALF TABLET BY  MOUTH AT BEDTIME What changed:  Another medication with the same name was changed. Make sure you understand how and when to take each.   FLUoxetine 20 MG capsule Commonly known as:  PROZAC Take 1 capsule (20 mg total) by mouth daily.   fluticasone 50 MCG/ACT nasal spray Commonly known as:  FLONASE Place 1 spray into both nostrils daily as needed for allergies or rhinitis.   glipiZIDE 10 MG 24 hr tablet Commonly known as:  GLUCOTROL XL Take 1 tablet (10 mg total) by mouth daily.   levETIRAcetam 500 MG tablet Commonly known as:  KEPPRA Take 1 tablet (500 mg total) by  mouth 2 (two) times daily. What changed:  Another medication with the same name was added. Make sure you understand how and when to take each.   levETIRAcetam 500 MG tablet Commonly known as:  KEPPRA Take 1 tablet (500 mg total) by mouth 2 (two) times daily. What changed:  You were already taking a medication with the same name, and this prescription was added. Make sure you understand how and when to take each.   metFORMIN 750 MG 24 hr tablet Commonly known as:  GLUCOPHAGE-XR Take 1 tablet (750 mg total) by mouth daily with breakfast.   oxybutynin 5 MG tablet Commonly known as:  DITROPAN Take 1 tablet (5 mg total) by mouth 2 (two) times daily.   pantoprazole 40 MG tablet Commonly known as:  PROTONIX Take 1 tablet (40 mg total) by mouth daily.   telmisartan 40 MG tablet Commonly known as:  MICARDIS Take 1 tablet (40 mg total) by mouth daily.       IV site discontinued and catheter remains intact. Site without signs and symptoms of complications. Dressing and pressure applied.  Patient escorted to car by NT in a wheelchair,  no distress noted upon discharge.  Darnelle Maffucci 12/03/2017 1:42 PM

## 2017-12-03 NOTE — Evaluation (Signed)
Occupational Therapy Evaluation Patient Details Name: Frank [name] MRN: [id number] DOB: 1940-08-13 Today's Date: 12/03/2017    History of Present Illness Patient is a 77 y/o male admitted with worsening confusion & deterioration of function over 2 weeks time.  He has had bilateral SDH with previous bilateral (2 on L side) burr hole evacuation 08/04/17.  He is now s/p left frontoparietal craniotomy for evacuation of a subacute subdural hematoma   Clinical Impression   Patient evaluated by Occupational Therapy with no further acute OT needs identified. All education has been completed and the patient has no further questions. See below for any follow-up Occupational Therapy or equipment needs. OT to sign off. Thank you for referral.      Follow Up Recommendations  Home health OT    Equipment Recommendations  None recommended by OT    Recommendations for Other Services       Precautions / Restrictions Precautions Precautions: Fall Precaution Comments: no more drain      Mobility Bed Mobility               General bed mobility comments: oob in chair  Transfers Overall transfer level: Needs assistance Equipment used: Rolling walker (2 wheeled) Transfers: Sit to/from Stand Sit to Stand: Supervision         General transfer comment: supervision for safety    Balance Overall balance assessment: Needs assistance Sitting-balance support: Feet supported;No upper extremity supported Sitting balance-Leahy Scale: Good     Standing balance support: Bilateral upper extremity supported;No upper extremity supported;Single extremity supported Standing balance-Leahy Scale: Fair                             ADL either performed or assessed with clinical judgement   ADL Overall ADL's : Needs assistance/impaired     Grooming: Supervision/safety   Upper Body Bathing: Supervision/ safety Upper Body Bathing Details (indicate cue type and reason):  recommend sitting and wife / patient informed. pt plans to bath upon d/c and recommend speaking with RN about staples. pt advised to use hand held shower head for chest down unless RN gives other directions Lower Body Bathing: Supervison/ safety   Upper Body Dressing : Supervision/safety   Lower Body Dressing: Supervision/safety Lower Body Dressing Details (indicate cue type and reason): LOB due to attempting to don standing. pt educated on sitting to don LB lcothing Toilet Transfer: Product/process development scientist Details (indicate cue type and reason): discussed and wife showing pictures of grab bars at home. pt advised to keep RW with him for transfer to shower and then transfer to grab bars. pt is not to leave RW by the door to the bathroom Functional mobility during ADLs: Supervision/safety General ADL Comments: pt dressed for home during session. pt reports admission after two weeks at home each time . pt hopes to remain home longer than 2 weeks     Vision Baseline Vision/History: Wears glasses Wears Glasses: At all times       Perception     Praxis      Pertinent Vitals/Pain Pain Assessment: No/denies pain     Hand Dominance Right   Extremity/Trunk Assessment Upper Extremity Assessment Upper Extremity Assessment: Overall WFL for tasks assessed   Lower Extremity Assessment Lower Extremity Assessment: Overall WFL for tasks assessed   Cervical / Trunk Assessment Cervical / Trunk Assessment: Normal   Communication Communication Communication: No difficulties  Cognition Arousal/Alertness: Awake/alert Behavior During Therapy: WFL for tasks assessed/performed Overall Cognitive Status: Impaired/Different from baseline Area of Impairment: Safety/judgement                         Safety/Judgement: Decreased awareness of deficits;Decreased awareness of safety     General Comments: pt attempting to stand to dress instead of sitting with  balance deficits pt with LOB standing and then attempting it again during session   General Comments       Exercises     Shoulder Instructions      Home Living Family/patient expects to be discharged to:: Private residence Living Arrangements: Spouse/significant other Available Help at Discharge: Family;Available 24 hours/day Type of Home: House Home Access: Stairs to enter [employer] of Steps: 4 Entrance Stairs-Rails: Can reach both Home Layout: One level     Bathroom Shower/Tub: Occupational psychologist: Handicapped height Bathroom Accessibility: Yes How Accessible: Accessible via walker Home Equipment: Grab bars - tub/shower;Grab bars - toilet;Walker - 4 wheels;Shower seat;Adaptive equipment Adaptive Equipment: Sock aid Additional Comments: pt currently with home health therapist and aide in the home. wife states "he wont be alone"      Prior Functioning/Environment Level of Independence: Independent with assistive device(s)                 OT Problem List:        OT Treatment/Interventions:      OT Goals(Current goals can be found in the care plan section) Acute Rehab OT Goals Patient Stated Goal: to go home asap  OT Frequency:     Barriers to D/C:            Co-evaluation              AM-PAC PT "6 Clicks" Daily Activity     Outcome Measure Help from another person eating meals?: None Help from another person taking care of personal grooming?: None Help from another person toileting, which includes using toliet, bedpan, or urinal?: None Help from another person bathing (including washing, rinsing, drying)?: A Little Help from another person to put on and taking off regular upper body clothing?: None Help from another person to put on and taking off regular lower body clothing?: A Little 6 Click Score: 22   End of Session Equipment Utilized During Treatment: Rolling walker Nurse Communication: Mobility  status;Precautions  Activity Tolerance: Patient tolerated treatment well Patient left: in chair;with call bell/phone within reach;with family/visitor present  OT Visit Diagnosis: Unsteadiness on feet (R26.81)                Time: 3154-0086 OT Time Calculation (min): 15 min Charges:  OT General Charges $OT Visit: 1 Visit OT Evaluation $OT Eval Moderate Complexity: 1 Mod G-Codes:      Jeri Modena   OTR/L Pager: [phone] Office: [phone] .   Parke Poisson B 12/03/2017, 2:36 PM

## 2017-12-17 ENCOUNTER — Other Ambulatory Visit: Payer: Self-pay | Admitting: Neurosurgery

## 2017-12-17 DIAGNOSIS — S065X9A Traumatic subdural hemorrhage with loss of consciousness of unspecified duration, initial encounter: Secondary | ICD-10-CM

## 2017-12-17 DIAGNOSIS — S065XAA Traumatic subdural hemorrhage with loss of consciousness status unknown, initial encounter: Secondary | ICD-10-CM

## 2017-12-20 ENCOUNTER — Other Ambulatory Visit: Payer: Self-pay | Admitting: Internal Medicine

## 2017-12-20 DIAGNOSIS — K219 Gastro-esophageal reflux disease without esophagitis: Secondary | ICD-10-CM

## 2017-12-22 ENCOUNTER — Ambulatory Visit
Admission: RE | Admit: 2017-12-22 | Discharge: 2017-12-22 | Disposition: A | Discharge status: Home / Self Care | Payer: Worker's Compensation | Source: Ambulatory Visit | Attending: Neurosurgery | Admitting: Neurosurgery

## 2017-12-22 DIAGNOSIS — S065XAA Traumatic subdural hemorrhage with loss of consciousness status unknown, initial encounter: Secondary | ICD-10-CM

## 2017-12-22 DIAGNOSIS — S065X9A Traumatic subdural hemorrhage with loss of consciousness of unspecified duration, initial encounter: Secondary | ICD-10-CM

## 2017-12-30 ENCOUNTER — Ambulatory Visit (INDEPENDENT_AMBULATORY_CARE_PROVIDER_SITE_OTHER): Payer: Medicare Other | Admitting: Family Medicine

## 2017-12-30 ENCOUNTER — Encounter: Payer: Self-pay | Admitting: Family Medicine

## 2017-12-30 VITALS — BP 122/64 | HR 70 | Temp 98.6°F | Ht 70.0 in | Wt 213.0 lb

## 2017-12-30 DIAGNOSIS — R05 Cough: Secondary | ICD-10-CM

## 2017-12-30 DIAGNOSIS — R059 Cough, unspecified: Secondary | ICD-10-CM

## 2017-12-30 MED FILL — SHINGRIX 50 MCG SUS: 50 | 1 days supply | Qty: 1 | Fill #1

## 2017-12-30 NOTE — Progress Notes (Signed)
Frank [name] - 77 y.o. male MRN [id number]  Date of birth: 1941-03-26  SUBJECTIVE:  Including CC & ROS.  Chief Complaint  Patient presents with  . Cough    Frank [name] is a 77 y.o. male that is here today for an evaluation of a cough. Ongoing for two days. Admits to productive cough. Denies fevers, chills and body aches. He has not been around anyone with similar symptoms. He has not taken anything for the cough. Denies chest pain or shortness of breath.  Denies any PND.  Recently had craniotomy 12/01/17 to remove a hematoma from a fall.  Review of chest x-ray from 03/18/2016 shows bibasilar scarring.   Review of the echo from 2018 shows an ejection fraction of 55 to 60% with mild concentric hypertrophy.  Shows grade 1 diastolic dysfunction  Review of Systems  Constitutional: Negative for fever.  Respiratory: Positive for cough. Negative for shortness of breath.   Cardiovascular: Negative for chest pain.  Gastrointestinal: Negative for abdominal pain.    HISTORY: Past Medical, Surgical, Social, and Family History Reviewed & Updated per EMR.   Pertinent Historical Findings include:  Past Medical History:  Diagnosis Date  . Allergy    Rhinitis  . Anemia    NOS iron deficient and B12 deficient  . Arthritis   . Diabetes mellitus    Type 2  . GERD (gastroesophageal reflux disease)   . Hyperlipidemia   . Hypertension   . 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 OSA (obstructive sleep apnea)    cpap  . TBI (traumatic brain injury) [facility])     Past Surgical History:  Procedure Laterality Date  . APPENDECTOMY    . arm fracture Left 2011   with hardware  . BURR HOLE Bilateral 08/04/2017   Procedure: Haskell Flirt;  Surgeon: [name], MD;  Location: [facility];  Service: Neurosurgery;  Laterality: Bilateral;  . COLONOSCOPY    . CRANIOTOMY Left 12/01/2017   Procedure: Trudee Kuster Holes CRANIOTOMY HEMATOMA EVACUATION SUBDURAL;  Surgeon: [name], MD;  Location: [place];   Service: Neurosurgery;  Laterality: Left;  . ESOPHAGOGASTRODUODENOSCOPY  2006   gastritis  . ROTATOR CUFF REPAIR    . SEPTOPLASTY  1959   Deviated Septum  . 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 Lisinopril Cough  . Penicillins     REACTION: unspecified  Allergy as a child.  Patient does not remember reaction.  Has had cephalasporins without problems.  . Sulfamethoxazole     REACTION: as child    Family History  Problem Relation Age of Onset  . Heart disease Mother   . Breast cancer Mother   . Stroke Father   . Allergies Father        Father and children  . Coronary artery disease Brother   . Diabetes Neg Hx   . Colon cancer Neg Hx      Social History   Socioeconomic History  . Marital status: Married    Spouse name: Not on file  . Number of children: Not on file  . Years of education: Not on file  . Highest education level: Not on file  Occupational History  . Not on file  Social Needs  . Financial resource strain: Not on file  . Food insecurity:    Worry: Not on file    Inability: Not on file  . Transportation needs:    Medical: Not on file  Non-medical: Not on file  Tobacco Use  . Smoking status: Former Smoker    Packs/day: 1.00    Last attempt to quit: 07/14/1983    Years since quitting: 34.4  . Smokeless tobacco: Never Used  Substance and Sexual Activity  . Alcohol use: Yes    Alcohol/week: 1.2 oz    Types: 2 Standard drinks or equivalent per week    Comment: occassionally  . Drug use: No  . Sexual activity: Never  Lifestyle  . Physical activity:    Days per week: Not on file    Minutes per session: Not on file  . Stress: Not on file  Relationships  . Social connections:    Talks on phone: Not on file    Gets together: Not on file    Attends religious service: Not on file    Active member of club or organization: Not on file    Attends meetings of clubs or organizations: Not on file    Relationship  status: Not on file  . Intimate partner violence:    Fear of current or ex partner: Not on file    Emotionally abused: Not on file    Physically abused: Not on file    Forced sexual activity: Not on file  Other Topics Concern  . Not on file  Social History Narrative  . Not on file     PHYSICAL EXAM:  VS: BP 122/64 (BP Location: Left Arm, Patient Position: Sitting, Cuff Size: Normal)   Pulse 70   Temp 98.6 F (37 C) (Oral)   Ht 5\' 10"  (1.778 m)   Wt 213 lb (96.6 kg)   SpO2 95%   BMI 30.56 kg/m  Physical Exam Gen: NAD, alert, cooperative with exam, well-appearing ENT: normal lips, normal nasal mucosa,  Eye: normal EOM, normal conjunctiva and lids CV:  no edema, +2 pedal pulses, regular rate and rhythm Resp: no accessory muscle use, non-labored, clear to auscultation bilaterally Skin: no rashes, no areas of induration  Neuro: normal tone, normal sensation to touch Psych:  normal insight, alert and oriented MSK: sitting in wheelchair,      ASSESSMENT & PLAN:   Cough Doesn't appear infectious. Possible to be related to allergic source  - flonase  - continue zyrtec  - if no improvement then provided indications to follow up

## 2017-12-30 NOTE — Patient Instructions (Signed)
Nice to meet you  Please try using the 2 puffs of the flonase twice daily for a week and see how that does  Please let us know if your symptoms don't improve.

## 2017-12-30 NOTE — Assessment & Plan Note (Signed)
Doesn't appear infectious. Possible to be related to allergic source  - flonase  - continue zyrtec  - if no improvement then provided indications to follow up

## 2018-01-17 DIAGNOSIS — G4733 Obstructive sleep apnea (adult) (pediatric): Secondary | ICD-10-CM | POA: Diagnosis not present

## 2018-01-24 ENCOUNTER — Ambulatory Visit
Admission: RE | Admit: 2018-01-24 | Discharge: 2018-01-24 | Disposition: A | Discharge status: Home / Self Care | Payer: Worker's Compensation | Source: Ambulatory Visit | Attending: Neurosurgery | Admitting: Neurosurgery

## 2018-01-24 ENCOUNTER — Other Ambulatory Visit: Payer: Self-pay | Admitting: Neurosurgery

## 2018-01-24 DIAGNOSIS — S065X9A Traumatic subdural hemorrhage with loss of consciousness of unspecified duration, initial encounter: Secondary | ICD-10-CM

## 2018-01-24 DIAGNOSIS — S065XAA Traumatic subdural hemorrhage with loss of consciousness status unknown, initial encounter: Secondary | ICD-10-CM

## 2018-01-25 ENCOUNTER — Other Ambulatory Visit: Payer: Self-pay | Admitting: Neurosurgery

## 2018-01-25 DIAGNOSIS — S065XAA Traumatic subdural hemorrhage with loss of consciousness status unknown, initial encounter: Secondary | ICD-10-CM

## 2018-01-25 DIAGNOSIS — S065X9A Traumatic subdural hemorrhage with loss of consciousness of unspecified duration, initial encounter: Secondary | ICD-10-CM

## 2018-01-28 ENCOUNTER — Encounter: Payer: Self-pay | Admitting: Internal Medicine

## 2018-02-01 ENCOUNTER — Other Ambulatory Visit: Payer: Self-pay | Admitting: Internal Medicine

## 2018-02-01 DIAGNOSIS — N401 Enlarged prostate with lower urinary tract symptoms: Secondary | ICD-10-CM

## 2018-02-01 DIAGNOSIS — E118 Type 2 diabetes mellitus with unspecified complications: Secondary | ICD-10-CM

## 2018-02-01 DIAGNOSIS — I10 Essential (primary) hypertension: Secondary | ICD-10-CM

## 2018-02-01 DIAGNOSIS — E785 Hyperlipidemia, unspecified: Secondary | ICD-10-CM

## 2018-02-02 MED ORDER — LEVETIRACETAM 500 MG PO TABS: 500.0000 mg | ORAL_TABLET | Freq: Two times a day (BID) | ORAL | 0 refills | Status: DC

## 2018-02-02 NOTE — Addendum Note (Signed)
Addended by: Cresenciano Lick on: 02/02/2018 11:26 AM   Modules accepted: Orders

## 2018-02-02 NOTE — Addendum Note (Signed)
Addended by: Cresenciano Lick on: 02/02/2018 11:25 AM   Modules accepted: Orders

## 2018-02-09 ENCOUNTER — Other Ambulatory Visit: Payer: Self-pay | Admitting: Neurosurgery

## 2018-02-09 DIAGNOSIS — R569 Unspecified convulsions: Secondary | ICD-10-CM

## 2018-02-15 ENCOUNTER — Telehealth: Payer: Self-pay | Admitting: Internal Medicine

## 2018-02-15 ENCOUNTER — Ambulatory Visit (HOSPITAL_COMMUNITY)
Admission: RE | Admit: 2018-02-15 | Discharge: 2018-02-15 | Disposition: A | Discharge status: Home / Self Care | Payer: Worker's Compensation | Source: Ambulatory Visit | Attending: Neurosurgery | Admitting: Neurosurgery

## 2018-02-15 DIAGNOSIS — R569 Unspecified convulsions: Secondary | ICD-10-CM | POA: Diagnosis not present

## 2018-02-15 MED ORDER — LEVETIRACETAM 500 MG PO TABS: 500.0000 mg | ORAL_TABLET | Freq: Two times a day (BID) | ORAL | 0 refills | Status: DC

## 2018-02-15 NOTE — Progress Notes (Signed)
EEG complete - results pending 

## 2018-02-15 NOTE — Telephone Encounter (Signed)
Copied from [place] [id number]. Topic: Quick Communication - Rx Refill/Question >> Feb 15, 2018 11:41 AM Frank [name], NT wrote: Medication: levETIRAcetam (KEPPRA) 500 MG tablet ,  Has the patient contacted their pharmacy? yes  (Agent: If no, request that the patient contact the pharmacy for the refill. (Agent: If yes, when and what did the pharmacy advise?  Preferred Pharmacy (with phone number or street name [place], [place] [phone] (Phone) [phone] (Fax)      Agent: Please be advised that RX refills may take up to 3 business days. We ask that you follow-up with your pharmacy.

## 2018-02-15 NOTE — Procedures (Signed)
History: 77 year old male status post craniotomy for subdural's  Sedation: None  Technique: This is a 21 channel routine scalp EEG performed at the bedside with bipolar and monopolar montages arranged in accordance to the international 10/20 system of electrode placement. One channel was dedicated to EKG recording.    Background: The background consists of intermixed alpha and beta activities. There is a well defined posterior dominant rhythm of 9 hz that attenuates with eye opening.  There is increased slow activity associated with drowsiness, but sleep is not recorded.  Photic stimulation: Physiologic driving is not performed  EEG Abnormalities: None  Clinical Interpretation: This normal EEG is recorded in the waking and drowsy state. There was no seizure or seizure predisposition recorded on this study. Please note that a normal EEG does not preclude the possibility of epilepsy.   Frank Rack, MD [facility] [phone]  If 7pm- 7am, please page neurology on call as listed in [place].

## 2018-02-15 NOTE — Telephone Encounter (Signed)
Request for refill of Levetiracetam (Keppra) 500 mg tab. Prescription shows that it was faxed to [facility] on 02/02/18. Pt has contacted the pharmacy and prescription has not been received.

## 2018-02-15 NOTE — Telephone Encounter (Signed)
[facility]ent [facility]

## 2018-02-22 ENCOUNTER — Other Ambulatory Visit (INDEPENDENT_AMBULATORY_CARE_PROVIDER_SITE_OTHER): Payer: Medicare Other

## 2018-02-22 ENCOUNTER — Encounter: Payer: Self-pay | Admitting: Internal Medicine

## 2018-02-22 ENCOUNTER — Ambulatory Visit (INDEPENDENT_AMBULATORY_CARE_PROVIDER_SITE_OTHER): Payer: Medicare Other | Admitting: Internal Medicine

## 2018-02-22 VITALS — BP 134/78 | HR 72 | Temp 99.0°F | Resp 14 | Ht 70.0 in | Wt 217.0 lb

## 2018-02-22 DIAGNOSIS — E1142 Type 2 diabetes mellitus with diabetic polyneuropathy: Secondary | ICD-10-CM | POA: Diagnosis not present

## 2018-02-22 DIAGNOSIS — D510 Vitamin B12 deficiency anemia due to intrinsic factor deficiency: Secondary | ICD-10-CM | POA: Diagnosis not present

## 2018-02-22 DIAGNOSIS — E559 Vitamin D deficiency, unspecified: Secondary | ICD-10-CM

## 2018-02-22 DIAGNOSIS — I10 Essential (primary) hypertension: Secondary | ICD-10-CM

## 2018-02-22 LAB — CBC WITH DIFFERENTIAL/PLATELET
Basophils Absolute: 0.1 10*3/uL (ref 0.0–0.1)
Basophils Relative: 0.8 % (ref 0.0–3.0)
Eosinophils Absolute: 0.2 10*3/uL (ref 0.0–0.7)
Eosinophils Relative: 2.9 % (ref 0.0–5.0)
HCT: 32.4 % — ABNORMAL LOW (ref 39.0–52.0)
Hemoglobin: 10.7 g/dL — ABNORMAL LOW (ref 13.0–17.0)
Lymphocytes Relative: 19.4 % (ref 12.0–46.0)
Lymphs Abs: 1.3 10*3/uL (ref 0.7–4.0)
MCHC: 33.1 g/dL (ref 30.0–36.0)
MCV: 80.9 fl (ref 78.0–100.0)
Monocytes Absolute: 0.7 10*3/uL (ref 0.1–1.0)
Monocytes Relative: 10.9 % (ref 3.0–12.0)
Neutro Abs: 4.5 10*3/uL (ref 1.4–7.7)
Neutrophils Relative %: 66 % (ref 43.0–77.0)
Platelets: 174 10*3/uL (ref 150.0–400.0)
RBC: 4 Mil/uL — ABNORMAL LOW (ref 4.22–5.81)
RDW: 14.4 % (ref 11.5–15.5)
WBC: 6.8 10*3/uL (ref 4.0–10.5)

## 2018-02-22 LAB — BASIC METABOLIC PANEL
BUN: 25 mg/dL — ABNORMAL HIGH (ref 6–23)
CO2: 31 mEq/L (ref 19–32)
Calcium: 9 mg/dL (ref 8.4–10.5)
Chloride: 100 mEq/L (ref 96–112)
Creatinine, Ser: 1.37 mg/dL (ref 0.40–1.50)
GFR: 53.22 mL/min — ABNORMAL LOW (ref >60.00)
Glucose, Bld: 272 mg/dL — ABNORMAL HIGH (ref 70–99)
Potassium: 4.5 mEq/L (ref 3.5–5.1)
Sodium: 135 mEq/L (ref 135–145)

## 2018-02-22 LAB — VITAMIN D 25 HYDROXY (VIT D DEFICIENCY, FRACTURES): VITD: 29.77 ng/mL — ABNORMAL LOW (ref 30.00–100.00)

## 2018-02-22 LAB — HEMOGLOBIN A1C: Hgb A1c MFr Bld: 8 % — ABNORMAL HIGH (ref 4.6–6.5)

## 2018-02-22 MED ORDER — CYANOCOBALAMIN 1000 MCG/ML IJ SOLN
1000.0000 ug | Freq: Once | INTRAMUSCULAR | Status: AC
  Administered 2018-02-22: 1000 ug via INTRAMUSCULAR

## 2018-02-22 NOTE — Progress Notes (Signed)
Subjective:  Patient ID: Frank [name], male    DOB: August 09, 1940  Age: 77 y.o. MRN: [id number]  CC: Anemia; Hypertension; and Diabetes   HPI Davinder Haff presents for f/up - He is recovering from a subdural hematoma.  He tells me his balance, strength, and endurance are getting better with PT and OT.  He tells me he recently underwent an EEG that was unremarkable.  He has not recently received a vitamin B12 injection.  Outpatient Medications Prior to Visit  Medication Sig Dispense Refill  . acetaminophen (TYLENOL) 500 MG tablet Take 500 mg by mouth every 6 (six) hours as needed for mild pain.    [name] amLODipine (NORVASC) 10 MG tablet TAKE 1 TABLET BY MOUTH  DAILY 90 tablet 1  . atorvastatin (LIPITOR) 20 MG tablet TAKE ONE-HALF TABLET BY  MOUTH DAILY 45 tablet 1  . Carbonyl Iron 25 MG TABS Take 25 mg by mouth daily.    . cetirizine (ZYRTEC) 10 MG tablet Take 10 mg by mouth at bedtime.     [name] doxazosin (CARDURA) 4 MG tablet TAKE ONE-HALF TABLET BY  MOUTH AT BEDTIME 45 tablet 1  . FLUoxetine (PROZAC) 20 MG capsule TAKE 1 CAPSULE BY MOUTH  DAILY 90 capsule 1  . fluticasone (FLONASE) 50 MCG/ACT nasal spray Place 1 spray into both nostrils daily as needed for allergies or rhinitis.    [name] glipiZIDE (GLUCOTROL XL) 10 MG 24 hr tablet TAKE 1 TABLET BY MOUTH  DAILY 90 tablet 1  . levETIRAcetam (KEPPRA) 500 MG tablet Take 1 tablet (500 mg total) by mouth 2 (two) times daily. 180 tablet 0  . metFORMIN (GLUCOPHAGE-XR) 750 MG 24 hr tablet Take 1 tablet (750 mg total) by mouth daily with breakfast. 90 tablet 1  . oxybutynin (DITROPAN) 5 MG tablet TAKE 1 TABLET BY MOUTH TWO  TIMES DAILY 180 tablet 1  . pantoprazole (PROTONIX) 40 MG tablet TAKE 1 TABLET BY MOUTH  DAILY 90 tablet 1  . telmisartan (MICARDIS) 40 MG tablet TAKE 1 TABLET BY MOUTH  DAILY 90 tablet 1  . Cholecalciferol 2000 units TABS Take 1 tablet (2,000 Units total) by mouth daily. 90 tablet 1   No facility-administered medications  prior to visit.     ROS Review of Systems  Constitutional: Negative for diaphoresis, fatigue and unexpected weight change.  HENT: Negative.   Eyes: Negative.  Negative for visual disturbance.  Respiratory: Positive for apnea. Negative for cough, shortness of breath and wheezing.   Cardiovascular: Negative for chest pain, palpitations and leg swelling.  Gastrointestinal: Negative for abdominal pain, blood in stool, diarrhea and nausea.  Endocrine: Negative.  Negative for polydipsia, polyphagia and polyuria.  Genitourinary: Negative.  Negative for difficulty urinating.  Musculoskeletal: Positive for gait problem. Negative for arthralgias and myalgias.  Skin: Negative.  Negative for color change and pallor.  Neurological: Negative for dizziness, tremors, syncope, weakness, light-headedness, numbness and headaches.  Hematological: Negative for adenopathy. Does not bruise/bleed easily.  Psychiatric/Behavioral: Negative.  Negative for dysphoric mood and sleep disturbance. The patient is not nervous/anxious.     Objective:  BP 134/78 (BP Location: Left Arm, Patient Position: Sitting, Cuff Size: Large)   Pulse 72   Temp 99 F (37.2 C) (Oral)   Resp 14   Ht 5\' 10"  (1.778 m)   Wt 217 lb (98.4 kg)   SpO2 93%   BMI 31.14 kg/m   BP Readings from Last 3 Encounters:  02/22/18 134/78  12/30/17 122/64  12/03/17 [name])  134/107    Wt Readings from Last 3 Encounters:  02/22/18 217 lb (98.4 kg)  12/30/17 213 lb (96.6 kg)  12/01/17 203 lb 7.8 oz (92.3 kg)    Physical Exam  Constitutional: He is oriented to person, place, and time. No distress.  HENT:  Mouth/Throat: Oropharynx is clear and moist. No oropharyngeal exudate.  Eyes: Conjunctivae are normal. No scleral icterus.  Neck: Normal range of motion. Neck supple. No JVD present. No thyromegaly present.  Cardiovascular: Normal rate, regular rhythm and normal heart sounds.  Pulmonary/Chest: Effort normal and breath sounds normal. No  respiratory distress. He has no wheezes. He has no rales.  Abdominal: Soft. Normal appearance and bowel sounds are normal. He exhibits no mass. There is no hepatosplenomegaly. There is no tenderness.  Musculoskeletal: Normal range of motion. He exhibits no edema, tenderness or deformity.  Lymphadenopathy:    He has no cervical adenopathy.  Neurological: He is alert and oriented to person, place, and time. He displays atrophy. He displays no tremor. No sensory deficit. He exhibits abnormal muscle tone. He displays a negative Romberg sign. He displays no seizure activity. Coordination and gait abnormal.  Wheel chair bound  Skin: Skin is warm and dry. No rash noted. He is not diaphoretic.  Psychiatric: He has a normal mood and affect. His behavior is normal. Judgment and thought content normal.    Lab Results  Component Value Date   WBC 6.8 02/22/2018   HGB 10.7 (L) 02/22/2018   HCT 32.4 (L) 02/22/2018   PLT 174.0 02/22/2018   GLUCOSE 272 (H) 02/22/2018   CHOL 102 06/26/2017   TRIG 88 06/26/2017   HDL 42 06/26/2017   LDLCALC 42 06/26/2017   ALT 19 08/04/2017   AST 20 08/04/2017   NA 135 02/22/2018   K 4.5 02/22/2018   CL 100 02/22/2018   CREATININE 1.37 02/22/2018   BUN 25 (H) 02/22/2018   CO2 31 02/22/2018   TSH 0.89 06/16/2016   PSA 1.08 07/10/2015   INR 0.98 08/04/2017   HGBA1C 8.0 (H) 02/22/2018   MICROALBUR 12.6 (H) 11/16/2017    No results found.  Assessment & Plan:   Benjimen was seen today for anemia, hypertension and diabetes.  Diagnoses and all orders for this visit:  Vitamin B12 deficiency anemia due to intrinsic factor deficiency- He remains mildly anemic.  Will restart monthly B12 injections. -     cyanocobalamin ((VITAMIN B-12)) injection 1,000 mcg -     CBC with Differential/Platelet; Future  Type 2 diabetes mellitus with peripheral neuropathy ([place])- His A1c is at 8.0%.  This is an acceptable level of blood sugar control considering his age and comorbid  illnesses. -     Basic metabolic panel; Future -     Hemoglobin A1c; Future  Vitamin D deficiency- His vitamin D level remains low.  I have asked him to increase his vitamin D supplement to 4000 IUs/day. -     VITAMIN D 25 Hydroxy (Vit-D Deficiency, Fractures); Future  Essential hypertension- His blood pressure is well controlled.  Electrolytes and renal function are normal. -     Basic metabolic panel; Future   I have changed [name] Esquer "ken"'s Cholecalciferol. I am also having him maintain his cetirizine, acetaminophen, Carbonyl Iron, fluticasone, metFORMIN, pantoprazole, oxybutynin, FLUoxetine, telmisartan, glipiZIDE, atorvastatin, amLODipine, doxazosin, and levETIRAcetam. We administered cyanocobalamin.  Meds ordered this encounter  Medications  . cyanocobalamin ((VITAMIN B-12)) injection 1,000 mcg  . Cholecalciferol 2000 units TABS    Sig: Take  2 tablets (4,000 Units total) by mouth daily.    Dispense:  180 tablet    Refill:  1     Follow-up: No follow-ups on file.  Scarlette Calico, MD

## 2018-02-23 ENCOUNTER — Encounter: Payer: Self-pay | Admitting: Internal Medicine

## 2018-02-24 ENCOUNTER — Encounter: Payer: Self-pay | Admitting: Internal Medicine

## 2018-02-24 MED ORDER — CHOLECALCIFEROL 50 MCG (2000 UT) PO TABS: 2.0000 | ORAL_TABLET | Freq: Every day | ORAL | 1 refills | Status: DC

## 2018-02-24 NOTE — Patient Instructions (Signed)

## 2018-03-10 ENCOUNTER — Telehealth: Payer: Self-pay | Admitting: Internal Medicine

## 2018-03-10 NOTE — Telephone Encounter (Signed)
Copied from [place] [id number]. Topic: Quick Communication - See Telephone Encounter >> Mar 10, 2018  9:37 AM Ivar Drape wrote: CRM for notification. See Telephone encounter for: 03/10/18. The patient needs an order for the PT and OT to continue at [facility].  PT, 3 times a week for 1/2 hr and OT twice a week for 1/2hr.  Order needs to be sent to Kandis Mannan at [place] [phone].

## 2018-03-11 NOTE — Telephone Encounter (Signed)
They are faxing orders to be signed.

## 2018-03-15 DIAGNOSIS — L821 Other seborrheic keratosis: Secondary | ICD-10-CM | POA: Diagnosis not present

## 2018-03-15 DIAGNOSIS — L570 Actinic keratosis: Secondary | ICD-10-CM | POA: Diagnosis not present

## 2018-03-15 DIAGNOSIS — D485 Neoplasm of uncertain behavior of skin: Secondary | ICD-10-CM | POA: Diagnosis not present

## 2018-03-23 ENCOUNTER — Other Ambulatory Visit: Payer: Self-pay | Admitting: Internal Medicine

## 2018-03-25 ENCOUNTER — Ambulatory Visit (INDEPENDENT_AMBULATORY_CARE_PROVIDER_SITE_OTHER): Payer: Medicare Other | Admitting: Emergency Medicine

## 2018-03-25 DIAGNOSIS — D510 Vitamin B12 deficiency anemia due to intrinsic factor deficiency: Secondary | ICD-10-CM | POA: Diagnosis not present

## 2018-03-25 DIAGNOSIS — Z23 Encounter for immunization: Secondary | ICD-10-CM

## 2018-03-25 IMAGING — CT CT HEAD W/O CM
4 series · 15 of 47 positions shown, 17 images · non-contrast
Comparison: Prior CT from 08/04/2017.

CLINICAL DATA: Follow-up examination for subdural hemorrhage.

EXAM:
CT HEAD WITHOUT CONTRAST
TECHNIQUE: Contiguous axial images were obtained from the base of the skull
through the vertex without intravenous contrast.

[Series 3: head wo · axial · 0.43mm/px · z∈[-116,+4]mm · 7 of 33 slices shown, 9 images]
[im 5/33  brain]
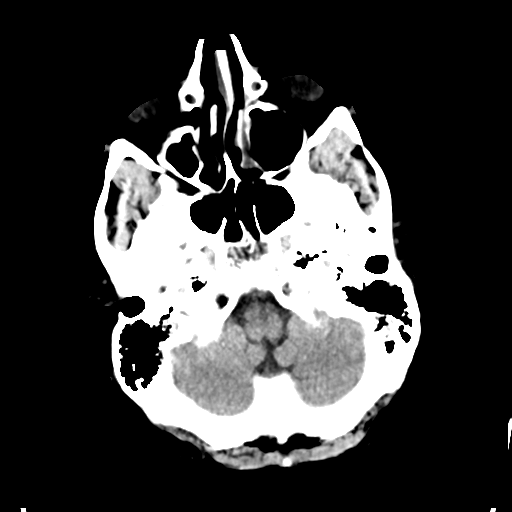
[im 5/33  bone]
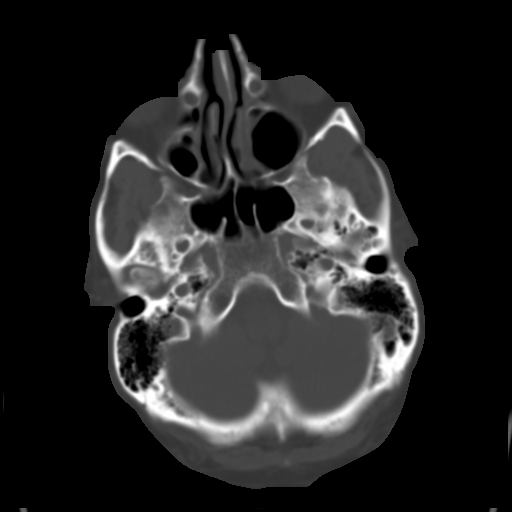
[im 9/33  brain]
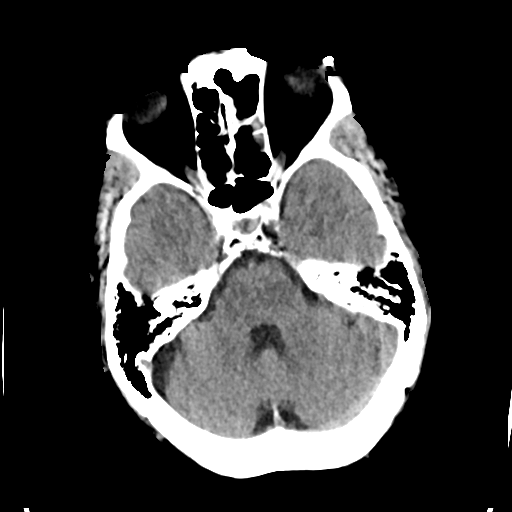
[im 13/33  brain]
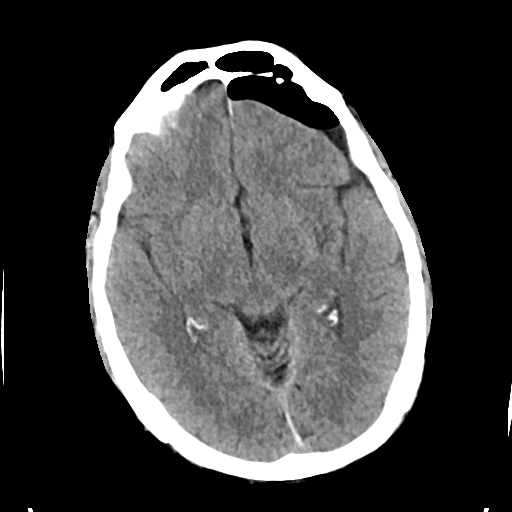
[im 17/33  brain]
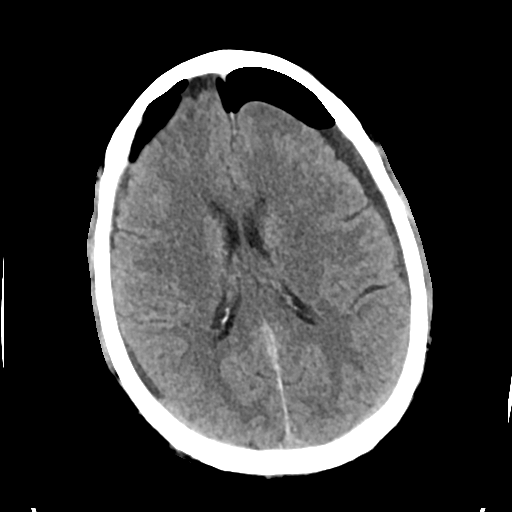
[im 21/33  brain]
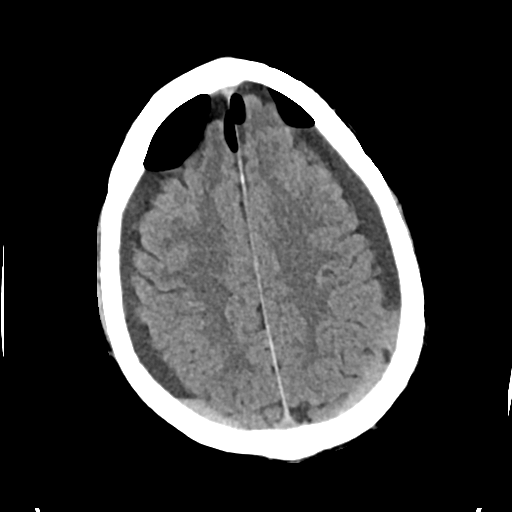
[im 21/33  bone]
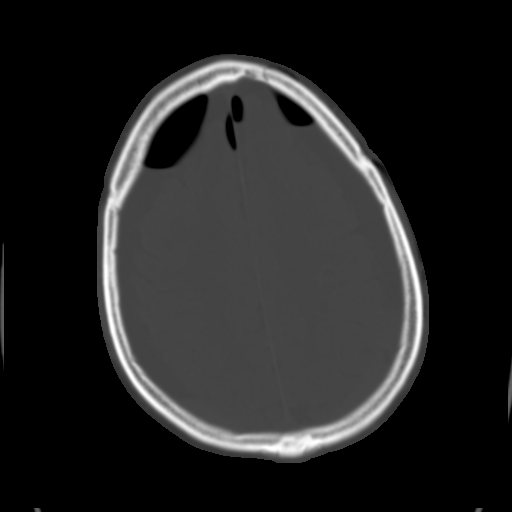
[im 25/33  brain]
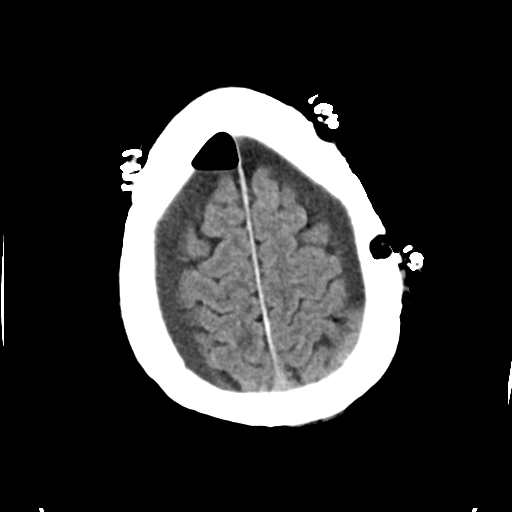
[im 29/33  brain]
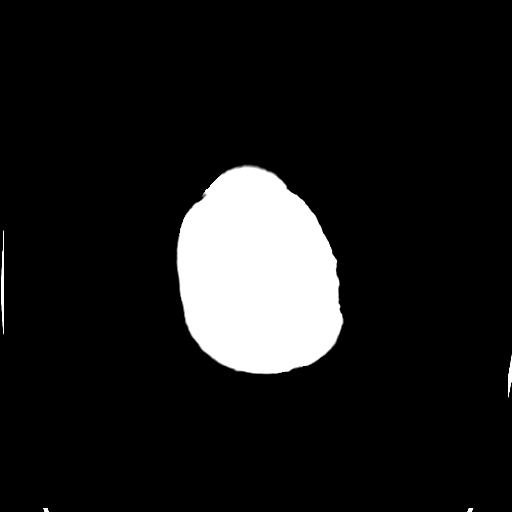

[Series 4: head bone · axial · 0.43mm/px · z∈[-120,-104]mm · 2 of 81 slices shown]
[im 9/81  bone]
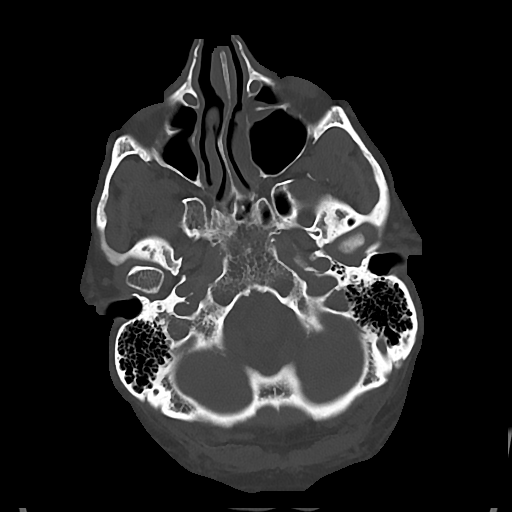
[im 17/81  bone]
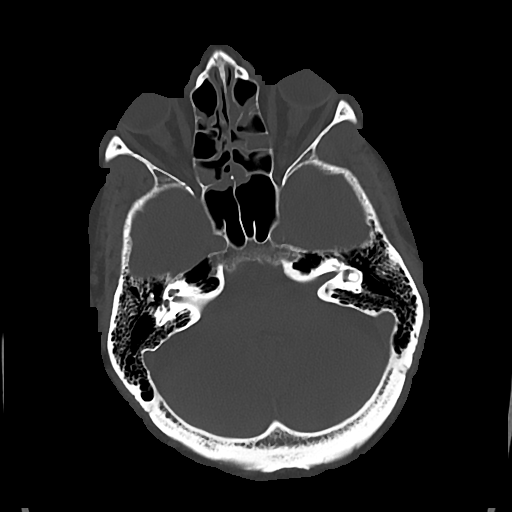

[Series 5: cor soft · coronal · 0.33mm/px · 3 of 75 slices shown]
[im 25/75  brain]
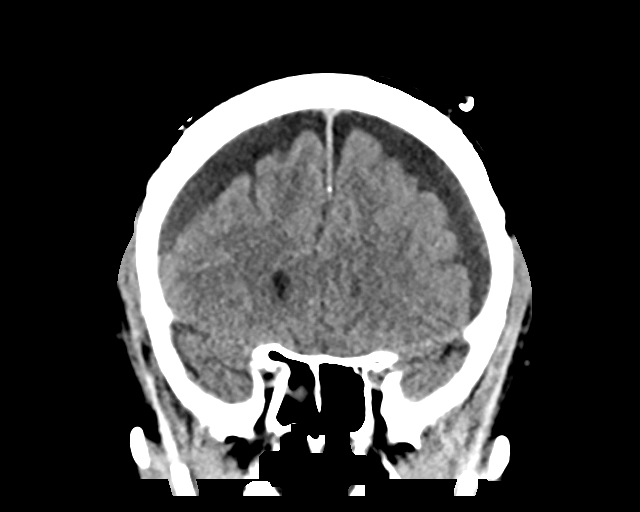
[im 33/75  brain]
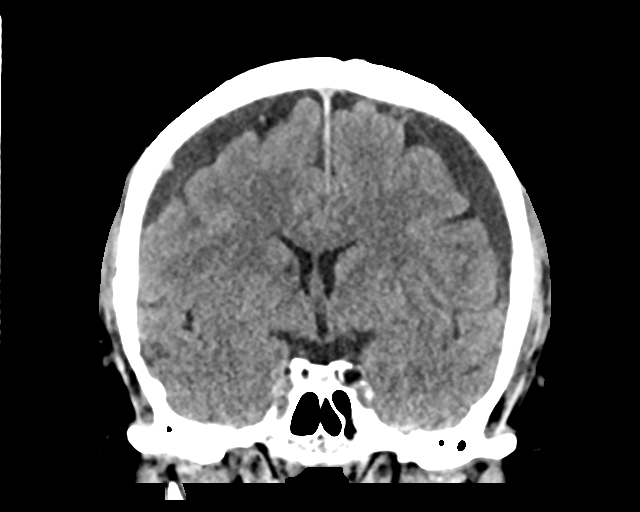
[im 42/75  brain]
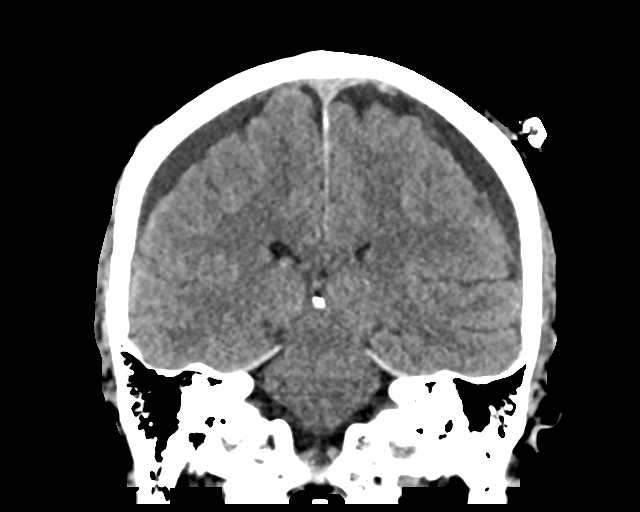

[Series 6: sag soft · sagittal · 0.32mm/px · 3 of 67 slices shown]
[im 23/67  brain]
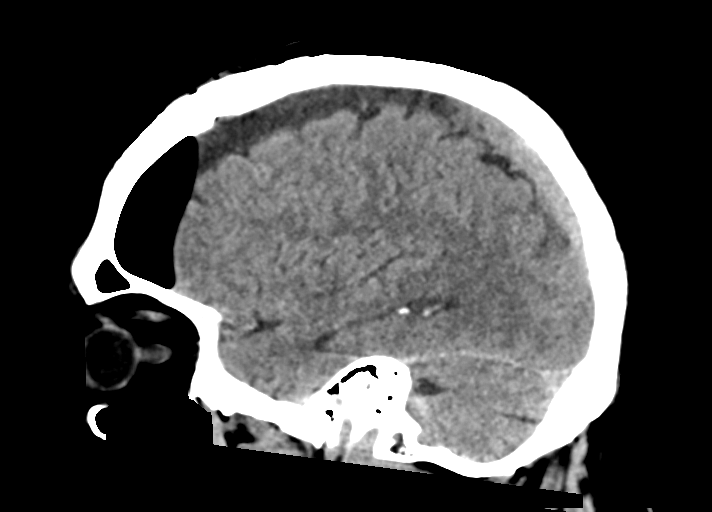
[im 34/67  brain]
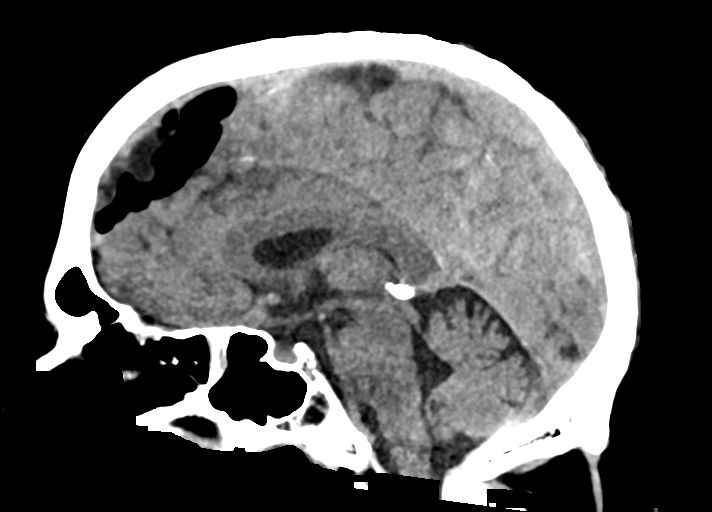
[im 45/67  brain]
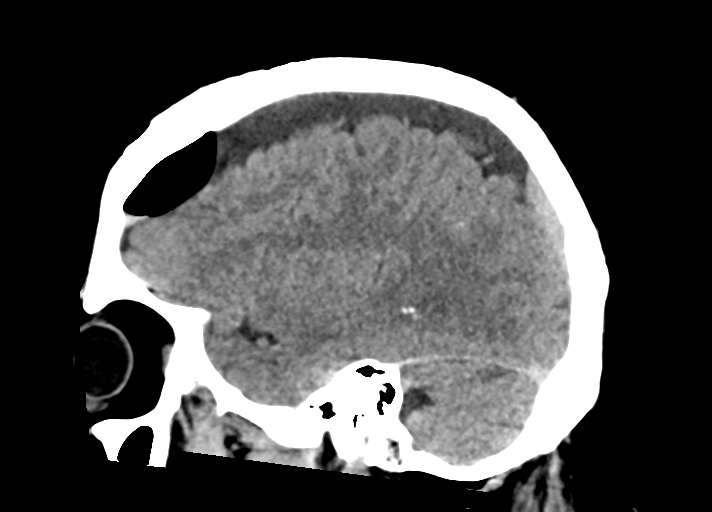

[15 of 47 positions shown; findings below may reference images not displayed]

FINDINGS: Brain: Postoperative changes from interval bilateral burr hole
placement for subdural evacuation is seen. Two burr holes have been
performed on the left with a single burr hole on the right.
Postoperative pneumocephalus present within the extra-axial space
bilaterally. There has been partial evaluation of the previously
seen bilateral subdural collections, overall decreased in size from
previous. Collection on the left now measures up to 12 mm at the
level of the left frontal convexity, previously 16 mm. Collection on
the right also slightly decreased measuring up to 10 mm in maximal
thickness at the level of the right parietal convexity. Small amount
of acute blood products seen layering posteriorly within these
collections. Persistent but improved left-to-right shift measuring 6
mm. No hydrocephalus or ventricular trapping. Basilar cisterns
remain patent.

No other acute intracranial abnormality. Small remote lacunar
infarct noted at the right caudate. No acute large vessel territory
infarct. No other acute intracranial hemorrhage. No mass lesion.

Vascular: No hyperdense vessel. Scattered vascular calcifications
noted within the carotid siphons.

Skull: Skin staples overlie the bilateral burr hole craniotomies.
Associated soft tissue swelling with emphysema.

Sinuses/Orbits: Globes and orbital soft tissues within normal
limits. Patient status post lens extraction bilaterally. Metallic
foreign body just superior to the left globe again noted, unchanged.
Scattered ethmoidal, right sphenoid, and right maxillary sinus
mucosal thickening, likely chronic. Visualized mastoid air cells are
clear.

Other: None.
IMPRESSION: 1. Postoperative changes from interval bilateral burr hole
craniotomy for subdural evacuation. No complication.
2. Bilateral subdural collections overall decreased in size as
compared to previous. Persistent but improved 6 mm of left-to-right
midline shift. No hydrocephalus or ventricular trapping.
3. No other new acute intracranial abnormality.

## 2018-03-25 MED ORDER — CYANOCOBALAMIN 1000 MCG/ML IJ SOLN
1000.0000 ug | Freq: Once | INTRAMUSCULAR | Status: AC
  Administered 2018-03-25: 1000 ug via INTRAMUSCULAR

## 2018-03-25 NOTE — Progress Notes (Addendum)
b12 Injection given.   Binnie Rail, MD  I have reviewed and agree

## 2018-04-18 DIAGNOSIS — G4733 Obstructive sleep apnea (adult) (pediatric): Secondary | ICD-10-CM | POA: Diagnosis not present

## 2018-04-25 ENCOUNTER — Ambulatory Visit (INDEPENDENT_AMBULATORY_CARE_PROVIDER_SITE_OTHER): Payer: Medicare Other

## 2018-04-25 DIAGNOSIS — E538 Deficiency of other specified B group vitamins: Secondary | ICD-10-CM | POA: Diagnosis not present

## 2018-04-25 MED ORDER — CYANOCOBALAMIN 1000 MCG/ML IJ SOLN
1000.0000 ug | Freq: Once | INTRAMUSCULAR | Status: AC
  Administered 2018-04-25: 1000 ug via INTRAMUSCULAR

## 2018-05-11 ENCOUNTER — Other Ambulatory Visit: Payer: Self-pay

## 2018-05-11 NOTE — Patient Outreach (Signed)
[facility]) Care Management  05/11/2018  Frank [name] 1941/07/03 [id number]   Medication Adherence call to [name] left a message for patient to call back patient is due on Atorvastatin 20 mg and Telmisartan 40 mg. [name] is showing past due under Frank [name].   [place] [place] Management Direct Dial [phone]  Fax [phone] [name]

## 2018-05-21 ENCOUNTER — Other Ambulatory Visit: Payer: Self-pay | Admitting: Internal Medicine

## 2018-05-21 DIAGNOSIS — E118 Type 2 diabetes mellitus with unspecified complications: Secondary | ICD-10-CM

## 2018-05-23 ENCOUNTER — Other Ambulatory Visit: Payer: Self-pay

## 2018-05-23 NOTE — Patient Outreach (Signed)
[name]) Care Management  05/23/2018  Frank [name] 1940-11-11 [id number]   Medication Adherence call to [name] spoke with patient he is due on Atorvastatin 20 mg and Telmisartan 40 mg patient have call [facility] to order both medication but, [facility] is on back order on Telmisartan left a message at doctor office to change to a different one. [name] is showing past due under Frank [name].   [facility] Management Direct Dial [phone]  Fax [phone] [name]

## 2018-05-25 ENCOUNTER — Encounter: Payer: Self-pay | Admitting: Internal Medicine

## 2018-05-25 ENCOUNTER — Ambulatory Visit (INDEPENDENT_AMBULATORY_CARE_PROVIDER_SITE_OTHER): Payer: Medicare Other | Admitting: Internal Medicine

## 2018-05-25 ENCOUNTER — Other Ambulatory Visit (INDEPENDENT_AMBULATORY_CARE_PROVIDER_SITE_OTHER): Payer: Medicare Other

## 2018-05-25 VITALS — BP 132/70 | HR 72 | Temp 98.2°F | Resp 16 | Ht 70.0 in | Wt 217.2 lb

## 2018-05-25 DIAGNOSIS — E785 Hyperlipidemia, unspecified: Secondary | ICD-10-CM

## 2018-05-25 DIAGNOSIS — I10 Essential (primary) hypertension: Secondary | ICD-10-CM | POA: Diagnosis not present

## 2018-05-25 DIAGNOSIS — E1142 Type 2 diabetes mellitus with diabetic polyneuropathy: Secondary | ICD-10-CM | POA: Diagnosis not present

## 2018-05-25 DIAGNOSIS — D510 Vitamin B12 deficiency anemia due to intrinsic factor deficiency: Secondary | ICD-10-CM

## 2018-05-25 DIAGNOSIS — E538 Deficiency of other specified B group vitamins: Secondary | ICD-10-CM

## 2018-05-25 DIAGNOSIS — E559 Vitamin D deficiency, unspecified: Secondary | ICD-10-CM

## 2018-05-25 LAB — BASIC METABOLIC PANEL
BUN: 34 mg/dL — ABNORMAL HIGH (ref 6–23)
CO2: 30 mEq/L (ref 19–32)
Calcium: 9.1 mg/dL (ref 8.4–10.5)
Chloride: 102 mEq/L (ref 96–112)
Creatinine, Ser: 1.41 mg/dL (ref 0.40–1.50)
GFR: 51.45 mL/min — ABNORMAL LOW (ref >60.00)
Glucose, Bld: 187 mg/dL — ABNORMAL HIGH (ref 70–99)
Potassium: 4.7 mEq/L (ref 3.5–5.1)
Sodium: 139 mEq/L (ref 135–145)

## 2018-05-25 LAB — LIPID PANEL
Cholesterol: 146 mg/dL (ref 0–200)
HDL: 46.1 mg/dL (ref >39.00)
LDL Cholesterol: 69 mg/dL (ref 0–99)
NonHDL: 100.16
Total CHOL/HDL Ratio: 3
Triglycerides: 158 mg/dL — ABNORMAL HIGH (ref 0.0–149.0)
VLDL: 31.6 mg/dL (ref 0.0–40.0)

## 2018-05-25 LAB — CBC WITH DIFFERENTIAL/PLATELET
Basophils Absolute: 0 10*3/uL (ref 0.0–0.1)
Basophils Relative: 0.9 % (ref 0.0–3.0)
Eosinophils Absolute: 0.3 10*3/uL (ref 0.0–0.7)
Eosinophils Relative: 5.3 % — ABNORMAL HIGH (ref 0.0–5.0)
HCT: 35.1 % — ABNORMAL LOW (ref 39.0–52.0)
Hemoglobin: 11.7 g/dL — ABNORMAL LOW (ref 13.0–17.0)
Lymphocytes Relative: 21 % (ref 12.0–46.0)
Lymphs Abs: 1.1 10*3/uL (ref 0.7–4.0)
MCHC: 33.3 g/dL (ref 30.0–36.0)
MCV: 81.5 fl (ref 78.0–100.0)
Monocytes Absolute: 0.6 10*3/uL (ref 0.1–1.0)
Monocytes Relative: 11.2 % (ref 3.0–12.0)
Neutro Abs: 3.4 10*3/uL (ref 1.4–7.7)
Neutrophils Relative %: 61.6 % (ref 43.0–77.0)
Platelets: 183 10*3/uL (ref 150.0–400.0)
RBC: 4.3 Mil/uL (ref 4.22–5.81)
RDW: 14.1 % (ref 11.5–15.5)
WBC: 5.5 10*3/uL (ref 4.0–10.5)

## 2018-05-25 LAB — VITAMIN D 25 HYDROXY (VIT D DEFICIENCY, FRACTURES): VITD: 33.42 ng/mL (ref 30.00–100.00)

## 2018-05-25 LAB — POCT GLYCOSYLATED HEMOGLOBIN (HGB A1C): Hemoglobin A1C: 7.6 % — AB (ref 4.0–5.6)

## 2018-05-25 LAB — TSH: TSH: 2.02 u[IU]/mL (ref 0.35–4.50)

## 2018-05-25 MED ORDER — AZILSARTAN MEDOXOMIL 40 MG PO TABS: 1.0000 | ORAL_TABLET | Freq: Every day | ORAL | 1 refills | Status: DC

## 2018-05-25 MED ORDER — CYANOCOBALAMIN 1000 MCG/ML IJ SOLN
1000.0000 ug | Freq: Once | INTRAMUSCULAR | Status: AC
  Administered 2018-05-25: 1000 ug via INTRAMUSCULAR

## 2018-05-25 NOTE — Patient Instructions (Signed)

## 2018-05-25 NOTE — Progress Notes (Signed)
Subjective:  Patient ID: Frank [name], male    DOB: [birth year]  Age: 77 y.o. MRN: [id number]  CC: Hyperlipidemia; Hypertension; Diabetes; and Anemia   HPI Frank [name] presents for f/up - He tells me his insurance will no longer cover telmisartan and he needs a different ARB for blood pressure control.  He has been feeling better over the last few weeks with increasing strength, better endurance, and improving balance.  Outpatient Medications Prior to Visit  Medication Sig Dispense Refill  . amLODipine (NORVASC) 10 MG tablet TAKE 1 TABLET BY MOUTH  DAILY 90 tablet 1  . atorvastatin (LIPITOR) 20 MG tablet TAKE ONE-HALF TABLET BY  MOUTH DAILY 45 tablet 1  . Carbonyl Iron 25 MG TABS Take 25 mg by mouth daily.    . cetirizine (ZYRTEC) 10 MG tablet Take 10 mg by mouth at bedtime.     [name] doxazosin (CARDURA) 4 MG tablet TAKE ONE-HALF TABLET BY  MOUTH AT BEDTIME 45 tablet 1  . FLUoxetine (PROZAC) 20 MG capsule TAKE 1 CAPSULE BY MOUTH  DAILY 90 capsule 1  . fluticasone (FLONASE) 50 MCG/ACT nasal spray Place 1 spray into both nostrils daily as needed for allergies or rhinitis.    [name] glipiZIDE (GLUCOTROL XL) 10 MG 24 hr tablet TAKE 1 TABLET BY MOUTH  DAILY 90 tablet 1  . oxybutynin (DITROPAN) 5 MG tablet TAKE 1 TABLET BY MOUTH TWO  TIMES DAILY 180 tablet 1  . pantoprazole (PROTONIX) 40 MG tablet TAKE 1 TABLET BY MOUTH  DAILY 90 tablet 1  . metFORMIN (GLUCOPHAGE) 1000 MG tablet TAKE 1 TABLET (1000mg ) BY MOUTH TWO  TIMES DAILY 180 tablet 1  . acetaminophen (TYLENOL) 500 MG tablet Take 500 mg by mouth every 6 (six) hours as needed for mild pain.    . Cholecalciferol 2000 units TABS Take 2 tablets (4,000 Units total) by mouth daily. 180 tablet 1  . levETIRAcetam (KEPPRA) 500 MG tablet TAKE 1 TABLET BY MOUTH TWO  TIMES DAILY 180 tablet 0  . metFORMIN (GLUCOPHAGE-XR) 750 MG 24 hr tablet Take 1 tablet (750 mg total) by mouth daily with breakfast. 90 tablet 1  . telmisartan (MICARDIS) 40 MG  tablet TAKE 1 TABLET BY MOUTH  DAILY 90 tablet 1   No facility-administered medications prior to visit.     ROS Review of Systems  Constitutional: Negative.  Negative for diaphoresis, fatigue and unexpected weight change.  HENT: Negative.   Eyes: Negative for visual disturbance.  Respiratory: Negative for cough, chest tightness and shortness of breath.   Cardiovascular: Negative for chest pain, palpitations and leg swelling.  Gastrointestinal: Negative for abdominal pain, constipation, diarrhea, nausea and vomiting.  Endocrine: Negative.   Genitourinary: Negative.  Negative for difficulty urinating.  Musculoskeletal: Negative.  Negative for arthralgias and myalgias.  Skin: Negative.  Negative for color change.  Neurological: Negative.  Negative for dizziness, light-headedness and headaches.  Hematological: Negative for adenopathy. Does not bruise/bleed easily.  Psychiatric/Behavioral: Negative.     Objective:  BP 132/70 (BP Location: Left Arm, Patient Position: Sitting, Cuff Size: Normal)   Pulse 72   Temp 98.2 F (36.8 C) (Oral)   Resp 16   Ht 5\' 10"  (1.778 m)   Wt 217 lb 4 oz (98.5 kg)   SpO2 96%   BMI 31.17 kg/m   BP Readings from Last 3 Encounters:  05/25/18 132/70  02/22/18 134/78  12/30/17 122/64    Wt Readings from Last 3 Encounters:  05/25/18 217 lb 4  oz (98.5 kg)  02/22/18 217 lb (98.4 kg)  12/30/17 213 lb (96.6 kg)    Physical Exam  Constitutional: He is oriented to person, place, and time. No distress.  HENT:  Mouth/Throat: Oropharynx is clear and moist. No oropharyngeal exudate.  Eyes: Conjunctivae are normal. No scleral icterus.  Neck: Normal range of motion. Neck supple. No JVD present. No thyromegaly present.  Cardiovascular: Normal rate, regular rhythm and normal heart sounds.  No murmur heard. Pulmonary/Chest: Effort normal and breath sounds normal. No respiratory distress. He has no wheezes. He has no rales.  Abdominal: Soft. Bowel sounds are  normal. He exhibits no mass. There is no hepatosplenomegaly. There is no tenderness.  Musculoskeletal: Normal range of motion. He exhibits no edema, tenderness or deformity.  Lymphadenopathy:    He has no cervical adenopathy.  Neurological: He is alert and oriented to person, place, and time.  Skin: Skin is warm and dry. No rash noted. He is not diaphoretic.  Vitals reviewed.   Lab Results  Component Value Date   WBC 5.5 05/25/2018   HGB 11.7 (L) 05/25/2018   HCT 35.1 (L) 05/25/2018   PLT 183.0 05/25/2018   GLUCOSE 187 (H) 05/25/2018   CHOL 146 05/25/2018   TRIG 158.0 (H) 05/25/2018   HDL 46.10 05/25/2018   LDLCALC 69 05/25/2018   ALT 19 08/04/2017   AST 20 08/04/2017   NA 139 05/25/2018   K 4.7 05/25/2018   CL 102 05/25/2018   CREATININE 1.41 05/25/2018   BUN 34 (H) 05/25/2018   CO2 30 05/25/2018   TSH 2.02 05/25/2018   PSA 1.08 07/10/2015   INR 0.98 08/04/2017   HGBA1C 7.6 (A) 05/25/2018   MICROALBUR 12.6 (H) 11/16/2017    No results found.  Assessment & Plan:   Frank [name] was seen today for hyperlipidemia, hypertension, diabetes and anemia.  Diagnoses and all orders for this visit:  B12 deficiency -     cyanocobalamin ((VITAMIN B-12)) injection 1,000 mcg  Type 2 diabetes mellitus with peripheral neuropathy ([place])- His blood sugar is adequately well controlled with an A1c of 7.2%. -     POCT glycosylated hemoglobin (Hb A1C) -     Basic metabolic panel; Future -     Azilsartan Medoxomil (EDARBI) 40 MG TABS; Take 1 tablet by mouth daily.  Vitamin B12 deficiency anemia due to intrinsic factor deficiency -     CBC with Differential/Platelet; Future  Essential hypertension- His blood pressure is adequately well controlled.  Electrolytes and renal function are normal.  Will change him to a different ARB. -     Basic metabolic panel; Future -     TSH; Future -     Azilsartan Medoxomil (EDARBI) 40 MG TABS; Take 1 tablet by mouth daily.  Hyperlipidemia with target LDL  less than 100- He has achieved his LDL goal and is doing well on the statin. -     Lipid panel; Future  Vitamin D deficiency- His vitamin D level is normal now. -     VITAMIN D 25 Hydroxy (Vit-D Deficiency, Fractures); Future   I have discontinued Frank [name] "ken"'s acetaminophen, telmisartan, Cholecalciferol, and levETIRAcetam. I am also having him start on Azilsartan Medoxomil. Additionally, I am having him maintain his cetirizine, Carbonyl Iron, fluticasone, pantoprazole, oxybutynin, FLUoxetine, glipiZIDE, atorvastatin, amLODipine, doxazosin, and metFORMIN. We administered cyanocobalamin.  Meds ordered this encounter  Medications  . cyanocobalamin ((VITAMIN B-12)) injection 1,000 mcg  . Azilsartan Medoxomil (EDARBI) 40 MG TABS    Sig:  Take 1 tablet by mouth daily.    Dispense:  90 tablet    Refill:  1     Follow-up: Return in about 6 months (around 11/23/2018).  Scarlette Calico, MD

## 2018-05-26 ENCOUNTER — Encounter: Payer: Self-pay | Admitting: Internal Medicine

## 2018-05-27 ENCOUNTER — Encounter: Payer: Self-pay | Admitting: Internal Medicine

## 2018-06-02 ENCOUNTER — Encounter: Payer: Self-pay | Admitting: Internal Medicine

## 2018-06-02 NOTE — Progress Notes (Signed)
Outside notes received. Information abstracted. Notes sent to scan.  

## 2018-06-03 ENCOUNTER — Telehealth: Payer: Self-pay | Admitting: Internal Medicine

## 2018-06-03 NOTE — Telephone Encounter (Signed)
Copied from [place] [id number]. Topic: Quick Communication - See Telephone Encounter >> Jun 03, 2018  1:52 PM Conception Chancy, NT wrote: CRM for notification. See Telephone encounter for: 06/03/18.  [facility] is calling and requesting Telmisartan 40mg  1 a day. She would take a verbal or fax.   Cb# [phone] Fax# [phone]  Reference# [id number]

## 2018-06-03 NOTE — Telephone Encounter (Signed)
Telmisartan is not on pt medication list. BP med was changed to [place].  I have sent a mychart message to patient.

## 2018-06-06 ENCOUNTER — Other Ambulatory Visit: Payer: Self-pay | Admitting: Internal Medicine

## 2018-06-06 DIAGNOSIS — I10 Essential (primary) hypertension: Secondary | ICD-10-CM

## 2018-06-06 DIAGNOSIS — E1142 Type 2 diabetes mellitus with diabetic polyneuropathy: Secondary | ICD-10-CM

## 2018-06-06 MED ORDER — TELMISARTAN 80 MG PO TABS: 80.0000 mg | ORAL_TABLET | Freq: Every day | ORAL | 1 refills | Status: DC

## 2018-06-06 NOTE — Telephone Encounter (Signed)
PCP sent in telmisartan for pt.

## 2018-06-23 ENCOUNTER — Ambulatory Visit (INDEPENDENT_AMBULATORY_CARE_PROVIDER_SITE_OTHER): Payer: Medicare Other

## 2018-06-23 DIAGNOSIS — E538 Deficiency of other specified B group vitamins: Secondary | ICD-10-CM

## 2018-06-23 MED ORDER — CYANOCOBALAMIN 1000 MCG/ML IJ SOLN
1000.0000 ug | Freq: Once | INTRAMUSCULAR | Status: AC
  Administered 2018-06-23: 1000 ug via INTRAMUSCULAR

## 2018-06-23 NOTE — Progress Notes (Signed)
I have reviewed and agree.

## 2018-07-18 DIAGNOSIS — G4733 Obstructive sleep apnea (adult) (pediatric): Secondary | ICD-10-CM | POA: Diagnosis not present

## 2018-07-22 ENCOUNTER — Other Ambulatory Visit: Payer: Self-pay | Admitting: Internal Medicine

## 2018-07-25 ENCOUNTER — Ambulatory Visit (INDEPENDENT_AMBULATORY_CARE_PROVIDER_SITE_OTHER): Payer: Medicare Other | Admitting: Emergency Medicine

## 2018-07-25 DIAGNOSIS — E538 Deficiency of other specified B group vitamins: Secondary | ICD-10-CM

## 2018-07-25 MED ORDER — CYANOCOBALAMIN 1000 MCG/ML IJ SOLN
1000.0000 ug | Freq: Once | INTRAMUSCULAR | Status: AC
  Administered 2018-07-25: 1000 ug via INTRAMUSCULAR

## 2018-07-25 NOTE — Progress Notes (Signed)
I have reviewed and agree.

## 2018-08-16 ENCOUNTER — Other Ambulatory Visit: Payer: Self-pay | Admitting: Internal Medicine

## 2018-08-16 DIAGNOSIS — K219 Gastro-esophageal reflux disease without esophagitis: Secondary | ICD-10-CM

## 2018-08-16 DIAGNOSIS — E118 Type 2 diabetes mellitus with unspecified complications: Secondary | ICD-10-CM

## 2018-08-16 DIAGNOSIS — I10 Essential (primary) hypertension: Secondary | ICD-10-CM

## 2018-08-16 DIAGNOSIS — N401 Enlarged prostate with lower urinary tract symptoms: Secondary | ICD-10-CM

## 2018-08-16 DIAGNOSIS — E785 Hyperlipidemia, unspecified: Secondary | ICD-10-CM

## 2018-08-17 MED ORDER — PANTOPRAZOLE SODIUM 40 MG PO TBEC: 40.0000 mg | DELAYED_RELEASE_TABLET | Freq: Every day | ORAL | 1 refills | Status: DC

## 2018-08-25 ENCOUNTER — Ambulatory Visit: Payer: Medicare Other

## 2018-09-05 ENCOUNTER — Ambulatory Visit (INDEPENDENT_AMBULATORY_CARE_PROVIDER_SITE_OTHER): Payer: Medicare Other

## 2018-09-05 DIAGNOSIS — E538 Deficiency of other specified B group vitamins: Secondary | ICD-10-CM | POA: Diagnosis not present

## 2018-09-05 MED ORDER — CYANOCOBALAMIN 1000 MCG/ML IJ SOLN
1000.0000 ug | Freq: Once | INTRAMUSCULAR | Status: AC
  Administered 2018-09-05: 1000 ug via INTRAMUSCULAR

## 2018-09-05 NOTE — Progress Notes (Signed)
I have reviewed and agree.

## 2018-09-16 DIAGNOSIS — D225 Melanocytic nevi of trunk: Secondary | ICD-10-CM | POA: Diagnosis not present

## 2018-09-16 DIAGNOSIS — I8312 Varicose veins of left lower extremity with inflammation: Secondary | ICD-10-CM | POA: Diagnosis not present

## 2018-09-16 DIAGNOSIS — C44622 Squamous cell carcinoma of skin of right upper limb, including shoulder: Secondary | ICD-10-CM | POA: Diagnosis not present

## 2018-09-16 DIAGNOSIS — L570 Actinic keratosis: Secondary | ICD-10-CM | POA: Diagnosis not present

## 2018-09-16 DIAGNOSIS — I872 Venous insufficiency (chronic) (peripheral): Secondary | ICD-10-CM | POA: Diagnosis not present

## 2018-09-16 DIAGNOSIS — L821 Other seborrheic keratosis: Secondary | ICD-10-CM | POA: Diagnosis not present

## 2018-09-16 DIAGNOSIS — I8311 Varicose veins of right lower extremity with inflammation: Secondary | ICD-10-CM | POA: Diagnosis not present

## 2018-09-19 DIAGNOSIS — H5203 Hypermetropia, bilateral: Secondary | ICD-10-CM | POA: Diagnosis not present

## 2018-09-19 DIAGNOSIS — Z961 Presence of intraocular lens: Secondary | ICD-10-CM | POA: Diagnosis not present

## 2018-09-19 LAB — HM DIABETES EYE EXAM

## 2018-09-20 ENCOUNTER — Encounter: Payer: Self-pay | Admitting: Internal Medicine

## 2018-09-23 ENCOUNTER — Ambulatory Visit: Payer: Self-pay

## 2018-09-30 ENCOUNTER — Ambulatory Visit (INDEPENDENT_AMBULATORY_CARE_PROVIDER_SITE_OTHER): Payer: Medicare Other | Admitting: *Deleted

## 2018-09-30 ENCOUNTER — Other Ambulatory Visit: Payer: Self-pay

## 2018-09-30 DIAGNOSIS — E538 Deficiency of other specified B group vitamins: Secondary | ICD-10-CM | POA: Diagnosis not present

## 2018-09-30 MED ORDER — CYANOCOBALAMIN 1000 MCG/ML IJ SOLN
1000.0000 ug | Freq: Once | INTRAMUSCULAR | Status: AC
  Administered 2018-09-30: 1000 ug via INTRAMUSCULAR

## 2018-09-30 NOTE — Progress Notes (Signed)
b12 Injection given.   Frank Todt [name] [name], MD  

## 2018-10-11 ENCOUNTER — Telehealth: Payer: Self-pay | Admitting: *Deleted

## 2018-10-11 NOTE — Telephone Encounter (Signed)
Called patient to inform them the nurse needs to either convert their upcoming AWV to a virtual visit or reschedule the visit out into the future due to covid-19 safety measures. A virtual visit was scheduled.

## 2018-10-17 DIAGNOSIS — G4733 Obstructive sleep apnea (adult) (pediatric): Secondary | ICD-10-CM | POA: Diagnosis not present

## 2018-10-24 ENCOUNTER — Ambulatory Visit: Payer: Self-pay

## 2018-11-01 ENCOUNTER — Other Ambulatory Visit: Payer: Self-pay | Admitting: Internal Medicine

## 2018-11-01 DIAGNOSIS — I10 Essential (primary) hypertension: Secondary | ICD-10-CM

## 2018-11-01 DIAGNOSIS — E1142 Type 2 diabetes mellitus with diabetic polyneuropathy: Secondary | ICD-10-CM

## 2018-11-03 NOTE — Progress Notes (Addendum)
Subjective:   Frank [name] is a 78 y.o. male who presents for Medicare Annual/Subsequent preventive examination.  I connected with patient 11/07/18 at 10:00 AM EDT by a video enabled telemedicine application and verified that I am speaking with the correct person using two identifiers. Patient stated full name and DOB. Patient gave permission to continue with virtual visit. Patient's location was at home and Nurse's location was at [place] office.   Review of Systems:  No ROS.  Medicare Wellness Visit. Additional risk factors are reflected in the social history.  Cardiac Risk Factors include: advanced age (>59men, >79 women);diabetes mellitus;dyslipidemia;male gender;hypertension;obesity (BMI >30kg/m2) Sleep patterns: no sleep issues, feels rested on waking, gets up 0-1 times nightly to void and sleeps 7-8 hours nightly. Wears C-PAP routinely.  Home Safety/Smoke Alarms: Feels safe in home. Smoke alarms in place.  Living environment; residence and Firearm Safety: 1-story house/ trailer, equipment: Walkers, Type: rollator (not currently using) and Hydrologist, Type: Tub Surveyor, quantity. Lives with wife and an aide comes M-F to assist with ADLs, no needs for DME, good support system Seat Belt Safety/Bike Helmet: Wears seat belt.   PSA-  Lab Results  Component Value Date   PSA 1.08 07/10/2015   PSA 1.17 04/06/2014   PSA 0.93 10/05/2011       Objective:    Vitals: There were no vitals taken for this visit.  There is no height or weight on file to calculate BMI.  Advanced Directives 11/07/2018 12/01/2017 06/28/2017 06/23/2017 03/14/2017 08/31/2016 07/10/2015  Does Patient Have a Medical Advance Directive? Yes Yes No No No Yes Yes  Type of Paramedic of [place];Living will Living will - - - [place];Living will [place];Living will  Does patient want to make changes to medical advance directive? - No - Patient declined - -  - - No - Patient declined  Copy of [place] in Chart? No - copy requested - - - - No - copy requested Yes  Would patient like information on creating a medical advance directive? - - No - Patient declined No - Patient declined No - Patient declined - -    Tobacco Social History   Tobacco Use  Smoking Status Former Smoker  . Packs/day: 1.00  . Last attempt to quit: 07/14/1983  . Years since quitting: 35.3  Smokeless Tobacco Never Used     Counseling given: Not Answered  Past Medical History:  Diagnosis Date  . Allergy    Rhinitis  . Anemia    NOS iron deficient and B12 deficient  . Arthritis   . Diabetes mellitus    Type 2  . GERD (gastroesophageal reflux disease)   . Hyperlipidemia   . Hypertension   . 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 OSA (obstructive sleep apnea)    cpap  . TBI (traumatic brain injury) [facility])    Past Surgical History:  Procedure Laterality Date  . APPENDECTOMY    . arm fracture Left 2011   with hardware  . BURR HOLE Bilateral 08/04/2017   Procedure: Haskell Flirt;  Surgeon: [name], MD;  Location: [facility];  Service: Neurosurgery;  Laterality: Bilateral;  . COLONOSCOPY    . CRANIOTOMY Left 12/01/2017   Procedure: Trudee Kuster Holes CRANIOTOMY HEMATOMA EVACUATION SUBDURAL;  Surgeon: [name], MD;  Location: [facility];  Service: Neurosurgery;  Laterality: Left;  . ESOPHAGOGASTRODUODENOSCOPY  2006   gastritis  . ROTATOR CUFF REPAIR    .  SEPTOPLASTY  1959   Deviated Septum  . 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 Heart disease Mother   . Breast cancer Mother   . Stroke Father   . Allergies Father        Father and children  . Coronary artery disease Brother   . Diabetes Neg Hx   . Colon cancer Neg Hx    Social History   Socioeconomic History  . Marital status: Married    Spouse name: Not on file  . Number of children: 1  . Years of education: Not on file  . Highest education  level: Not on file  Occupational History  . Not on file  Social Needs  . Financial resource strain: Not hard at all  . Food insecurity:    Worry: Never true    Inability: Never true  . Transportation needs:    Medical: No    Non-medical: No  Tobacco Use  . Smoking status: Former Smoker    Packs/day: 1.00    Last attempt to quit: 07/14/1983    Years since quitting: 35.3  . Smokeless tobacco: Never Used  Substance and Sexual Activity  . Alcohol use: Yes    Alcohol/week: 2.0 standard drinks    Types: 2 Standard drinks or equivalent per week    Comment: occassionally  . Drug use: No  . Sexual activity: Never  Lifestyle  . Physical activity:    Days per week: 0 days    Minutes per session: 0 min  . Stress: Not at all  Relationships  . Social connections:    Talks on phone: More than three times a week    Gets together: More than three times a week    Attends religious service: Not on file    Active member of club or organization: Not on file    Attends meetings of clubs or organizations: Not on file    Relationship status: Married  Other Topics Concern  . Not on file  Social History Narrative  . Not on file    Outpatient Encounter Medications as of 11/07/2018  Medication Sig  . amLODipine (NORVASC) 10 MG tablet Take 1 tablet (10 mg total) by mouth daily.  [name] atorvastatin (LIPITOR) 20 MG tablet Take 0.5 tablets (10 mg total) by mouth daily.  Carin Hock Iron 25 MG TABS Take 25 mg by mouth daily.  . cetirizine (ZYRTEC) 10 MG tablet Take 10 mg by mouth at bedtime.   [name] doxazosin (CARDURA) 4 MG tablet Take 0.5 tablets (2 mg total) by mouth at bedtime.  [name] FLUoxetine (PROZAC) 20 MG capsule TAKE 1 CAPSULE BY MOUTH  DAILY  . fluticasone (FLONASE) 50 MCG/ACT nasal spray Place 1 spray into both nostrils daily as needed for allergies or rhinitis.  [name] glipiZIDE (GLUCOTROL XL) 10 MG 24 hr tablet Take 1 tablet (10 mg total) by mouth daily.  . metFORMIN (GLUCOPHAGE) 1000 MG tablet TAKE 1  TABLET (1000mg ) BY MOUTH TWO  TIMES DAILY  . oxybutynin (DITROPAN) 5 MG tablet TAKE 1 TABLET BY MOUTH TWO  TIMES DAILY  . telmisartan (MICARDIS) 80 MG tablet TAKE 1 TABLET BY MOUTH  DAILY  . pantoprazole (PROTONIX) 40 MG tablet Take 1 tablet (40 mg total) by mouth daily.   No facility-administered encounter medications on file as of 11/07/2018.     Activities of Daily Living In your present state of health, do you have any difficulty performing  the following activities: 11/07/2018 12/01/2017  Hearing? N N  Vision? N N  Difficulty concentrating or making decisions? N N  Walking or climbing stairs? Y Y  Dressing or bathing? Y N  Doing errands, shopping? Tempie Donning  Preparing Food and eating ? Y -  Using the Toilet? N -  In the past six months, have you accidently leaked urine? N -  Do you have problems with loss of bowel control? N -  Managing your Medications? N -  Managing your Finances? N -  Housekeeping or managing your Housekeeping? Y -  Some recent data might be hidden    Patient Care Team: Janith Lima, MD as PCP - General (Internal Medicine) [name], MD as Consulting Physician (Neurosurgery)   Assessment:   This is a routine wellness examination for Frank [name]. Physical assessment deferred to PCP.   Exercise Activities and Dietary recommendations Current Exercise Habits: Home exercise routine;Structured exercise class(goes to PT x3 weekly), Time (Minutes): 40, Frequency (Times/Week): 3, Weekly Exercise (Minutes/Week): 120, Intensity: Mild, Exercise limited by: orthopedic condition(s) Works with OT x2 weekly.  Diet (meal preparation, eat out, water intake, caffeinated beverages, dairy products, fruits and vegetables): in general, a "healthy" diet    Reports decrease appetite at times.   Discussed supplementing with Glucerna and coupons were mailed to patient. Reviewed heart healthy and diabetic diet Encouraged patient to increase daily water and healthy fluid intake.  Goals    .  Exercise 3x per week (60 min per time)     Walk in my neighborhood and/or go to the [facility] 3 times per week.        Fall Risk Fall Risk  11/07/2018 07/27/2017 08/31/2016 07/10/2015 07/10/2015  Falls in the past year? 1 Yes Yes - Yes  Number falls in past yr: 0 2 or more 2 or more 1 2 or more  Injury with Fall? 0 No - No No  Risk Factor Category  - - High Fall Risk - -  Risk for fall due to : History of fall(s);Impaired balance/gait;Impaired mobility Impaired balance/gait;Impaired mobility;History of fall(s) Impaired balance/gait;Impaired mobility History of fall(s) -  Risk for fall due to: Comment severe cranial injury with a past fall - - - -  Follow up - - - Education provided -    Depression Screen PHQ 2/9 Scores 11/07/2018 05/25/2018 11/16/2017 07/27/2017  PHQ - 2 Score 0 0 0 0  PHQ- 9 Score 2 1 0 -    Cognitive Function       Ad8 score reviewed for issues:  Issues making decisions: no  Less interest in hobbies / activities: no  Repeats questions, stories (family complaining): no  Trouble using ordinary gadgets (microwave, computer, phone):no  Forgets the month or year: no  Mismanaging finances: no  Remembering appts: no  Daily problems with thinking and/or memory: no Ad8 score is= 0  Immunization History  Administered Date(s) Administered  . Influenza Split 05/06/2011, 03/23/2012  . Influenza Whole 04/17/2009, 04/16/2010  . Influenza, High Dose Seasonal PF 03/11/2016, 05/04/2017, 03/25/2018  . Influenza,inj,Quad PF,6+ Mos 04/04/2013, 04/06/2014, 03/06/2015  . Pneumococcal Conjugate-13 11/01/2013  . Pneumococcal Polysaccharide-23 07/13/2001, 09/17/2010, 05/04/2017  . Td 01/16/2010  . Zoster 05/06/2011  . Zoster Recombinat (Shingrix) 10/19/2017, 01/04/2018   Screening Tests Health Maintenance  Topic Date Due  . INFLUENZA VACCINE  02/11/2019  . OPHTHALMOLOGY EXAM  09/19/2019  . TETANUS/TDAP  01/17/2020  . PNA vac Low Risk Adult  Completed  Plan:     Reviewed health maintenance screenings with patient today and relevant education, vaccines, and/or referrals were provided.   Continue to eat heart healthy diet (full of fruits, vegetables, whole grains, lean protein, water--limit salt, fat, and sugar intake) and increase physical activity as tolerated.  Continue doing brain stimulating activities (puzzles, reading, adult coloring books, staying active) to keep memory sharp.   I have personally reviewed and noted the following in the patient's chart:   . Medical and social history . Use of alcohol, tobacco or illicit drugs  . Current medications and supplements . Functional ability and status . Nutritional status . Physical activity . Advanced directives . List of other physicians . Screenings to include cognitive, depression, and falls . Referrals and appointments  In addition, I have reviewed and discussed with patient certain preventive protocols, quality metrics, and best practice recommendations. A written personalized care plan for preventive services as well as general preventive health recommendations were provided to patient.     Michiel Cowboy, RN  11/07/2018  Medical screening examination/treatment/procedure(s) were performed by non-physician practitioner and as supervising physician I was immediately available for consultation/collaboration. I agree with above. Scarlette Calico, MD

## 2018-11-07 ENCOUNTER — Ambulatory Visit (INDEPENDENT_AMBULATORY_CARE_PROVIDER_SITE_OTHER): Payer: Medicare Other | Admitting: *Deleted

## 2018-11-07 DIAGNOSIS — Z0000 Encounter for general adult medical examination without abnormal findings: Secondary | ICD-10-CM | POA: Diagnosis not present

## 2018-11-07 NOTE — Patient Instructions (Addendum)
Continue doing brain stimulating activities (puzzles, reading, adult coloring books, staying active) to keep memory sharp.   Continue to eat heart healthy diet (full of fruits, vegetables, whole grains, lean protein, water--limit salt, fat, and sugar intake) and increase physical activity as tolerated.   Frank [name] , Thank you for taking time to come for your Medicare Wellness Visit. I appreciate your ongoing commitment to your health goals. Please review the following plan we discussed and let me know if I can assist you in the future.   These are the goals we discussed: Goals    . Exercise 3x per week (60 min per time)     Walk in my neighborhood and/or go to the [facility] 3 times per week.     . Patient Stated     Continue to gain strength physically by staying active, increase the amount of water I drink daily, and I want to start to drive again.        This is a list of the screening recommended for you and due dates:  Health Maintenance  Topic Date Due  . Flu Shot  02/11/2019  . Eye exam for diabetics  09/19/2019  . Tetanus Vaccine  01/17/2020  . Pneumonia vaccines  Completed    Preventive Care 44 Years and Older, Male Preventive care refers to lifestyle choices and visits with your health care provider that can promote health and wellness. What does preventive care include?   A yearly physical exam. This is also called an annual well check.  Dental exams once or twice a year.  Routine eye exams. Ask your health care provider how often you should have your eyes checked.  Personal lifestyle choices, including: ? Daily care of your teeth and gums. ? Regular physical activity. ? Eating a healthy diet. ? Avoiding tobacco and drug use. ? Limiting alcohol use. ? Practicing safe sex. ? Taking low doses of aspirin every day. ? Taking vitamin and mineral supplements as recommended by your health care provider. What happens during an annual well check? The services and  screenings done by your health care provider during your annual well check will depend on your age, overall health, lifestyle risk factors, and family history of disease. Counseling Your health care provider may ask you questions about your:  Alcohol use.  Tobacco use.  Drug use.  Emotional well-being.  Home and relationship well-being.  Sexual activity.  Eating habits.  History of falls.  Memory and ability to understand (cognition).  Work and work Statistician. Screening You may have the following tests or measurements:  Height, weight, and BMI.  Blood pressure.  Lipid and cholesterol levels. These may be checked every 5 years, or more frequently if you are over 19 years old.  Skin check.  Lung cancer screening. You may have this screening every year starting at age 47 if you have a 30-pack-year history of smoking and currently smoke or have quit within the past 15 years.  Colorectal cancer screening. All adults should have this screening starting at age 40 and continuing until age 79. You will have tests every 1-10 years, depending on your results and the type of screening test. People at increased risk should start screening at an earlier age. Screening tests may include: ? Guaiac-based fecal occult blood testing. ? Fecal immunochemical test (FIT). ? Stool DNA test. ? Virtual colonoscopy. ? Sigmoidoscopy. During this test, a flexible tube with a tiny camera (sigmoidoscope) is used to examine your rectum and lower colon.  The sigmoidoscope is inserted through your anus into your rectum and lower colon. ? Colonoscopy. During this test, a long, thin, flexible tube with a tiny camera (colonoscope) is used to examine your entire colon and rectum.  Prostate cancer screening. Recommendations will vary depending on your family history and other risks.  Hepatitis C blood test.  Hepatitis B blood test.  Sexually transmitted disease (STD) testing.  Diabetes screening. This  is done by checking your blood sugar (glucose) after you have not eaten for a while (fasting). You may have this done every 1-3 years.  Abdominal aortic aneurysm (AAA) screening. You may need this if you are a current or former smoker.  Osteoporosis. You may be screened starting at age 46 if you are at high risk. Talk with your health care provider about your test results, treatment options, and if necessary, the need for more tests. Vaccines Your health care provider may recommend certain vaccines, such as:  Influenza vaccine. This is recommended every year.  Tetanus, diphtheria, and acellular pertussis (Tdap, Td) vaccine. You may need a Td booster every 10 years.  Varicella vaccine. You may need this if you have not been vaccinated.  Zoster vaccine. You may need this after age 78.  Measles, mumps, and rubella (MMR) vaccine. You may need at least one dose of MMR if you were born in 1957 or later. You may also need a second dose.  Pneumococcal 13-valent conjugate (PCV13) vaccine. One dose is recommended after age 62.  Pneumococcal polysaccharide (PPSV23) vaccine. One dose is recommended after age 60.  Meningococcal vaccine. You may need this if you have certain conditions.  Hepatitis A vaccine. You may need this if you have certain conditions or if you travel or work in places where you may be exposed to hepatitis A.  Hepatitis B vaccine. You may need this if you have certain conditions or if you travel or work in places where you may be exposed to hepatitis B.  Haemophilus influenzae type b (Hib) vaccine. You may need this if you have certain risk factors. Talk to your health care provider about which screenings and vaccines you need and how often you need them. This information is not intended to replace advice given to you by your health care provider. Make sure you discuss any questions you have with your health care provider. Document Released: 07/26/2015 Document Revised:  08/19/2017 Document Reviewed: 04/30/2015 Elsevier Interactive Patient Education  2019 [employer].

## 2018-11-14 DIAGNOSIS — G4733 Obstructive sleep apnea (adult) (pediatric): Secondary | ICD-10-CM | POA: Diagnosis not present

## 2018-11-16 ENCOUNTER — Telehealth: Payer: Self-pay

## 2018-11-16 NOTE — Telephone Encounter (Signed)
Routing to dr jones----patient has upcoming appt with you on 11/21/18---right now it's showing to be a virtual visit, however patient's last A1c lab was 02/2018---and patient is due b12 injection--do you recommend patient come into office for his visit or do you want to keep virtual visit ?  Please advise, I will call patient to confirm type of visit, thanks

## 2018-11-16 NOTE — Telephone Encounter (Signed)
Ask him to come in for an OV with B12 inj and A1C  TJ

## 2018-11-17 NOTE — Telephone Encounter (Signed)
[facility] office to call patient to see if he can make this visit in person to check a1c and get b12 injection

## 2018-11-21 ENCOUNTER — Encounter: Payer: Self-pay | Admitting: Internal Medicine

## 2018-11-21 ENCOUNTER — Other Ambulatory Visit: Payer: Self-pay

## 2018-11-21 ENCOUNTER — Ambulatory Visit (INDEPENDENT_AMBULATORY_CARE_PROVIDER_SITE_OTHER)
Admission: RE | Admit: 2018-11-21 | Discharge: 2018-11-21 | Disposition: A | Discharge status: Home / Self Care | Payer: Medicare Other | Source: Ambulatory Visit | Attending: Internal Medicine | Admitting: Internal Medicine

## 2018-11-21 ENCOUNTER — Ambulatory Visit (INDEPENDENT_AMBULATORY_CARE_PROVIDER_SITE_OTHER): Payer: Medicare Other | Admitting: Internal Medicine

## 2018-11-21 ENCOUNTER — Other Ambulatory Visit (INDEPENDENT_AMBULATORY_CARE_PROVIDER_SITE_OTHER): Payer: Medicare Other

## 2018-11-21 VITALS — BP 132/56 | HR 77 | Temp 98.4°F | Resp 16 | Ht 70.0 in | Wt 213.0 lb

## 2018-11-21 DIAGNOSIS — R05 Cough: Secondary | ICD-10-CM

## 2018-11-21 DIAGNOSIS — E559 Vitamin D deficiency, unspecified: Secondary | ICD-10-CM

## 2018-11-21 DIAGNOSIS — R059 Cough, unspecified: Secondary | ICD-10-CM

## 2018-11-21 DIAGNOSIS — I10 Essential (primary) hypertension: Secondary | ICD-10-CM

## 2018-11-21 DIAGNOSIS — D510 Vitamin B12 deficiency anemia due to intrinsic factor deficiency: Secondary | ICD-10-CM

## 2018-11-21 DIAGNOSIS — E1142 Type 2 diabetes mellitus with diabetic polyneuropathy: Secondary | ICD-10-CM

## 2018-11-21 DIAGNOSIS — E1129 Type 2 diabetes mellitus with other diabetic kidney complication: Secondary | ICD-10-CM

## 2018-11-21 DIAGNOSIS — J988 Other specified respiratory disorders: Secondary | ICD-10-CM

## 2018-11-21 DIAGNOSIS — N183 Chronic kidney disease, stage 3 unspecified: Secondary | ICD-10-CM | POA: Insufficient documentation

## 2018-11-21 DIAGNOSIS — R809 Proteinuria, unspecified: Secondary | ICD-10-CM

## 2018-11-21 LAB — CBC WITH DIFFERENTIAL/PLATELET
Basophils Absolute: 0.1 10*3/uL (ref 0.0–0.1)
Basophils Relative: 0.9 % (ref 0.0–3.0)
Eosinophils Absolute: 0.3 10*3/uL (ref 0.0–0.7)
Eosinophils Relative: 4.6 % (ref 0.0–5.0)
HCT: 33.5 % — ABNORMAL LOW (ref 39.0–52.0)
Hemoglobin: 11 g/dL — ABNORMAL LOW (ref 13.0–17.0)
Lymphocytes Relative: 20.6 % (ref 12.0–46.0)
Lymphs Abs: 1.3 10*3/uL (ref 0.7–4.0)
MCHC: 32.7 g/dL (ref 30.0–36.0)
MCV: 82.3 fl (ref 78.0–100.0)
Monocytes Absolute: 0.6 10*3/uL (ref 0.1–1.0)
Monocytes Relative: 10 % (ref 3.0–12.0)
Neutro Abs: 3.9 10*3/uL (ref 1.4–7.7)
Neutrophils Relative %: 63.9 % (ref 43.0–77.0)
Platelets: 180 10*3/uL (ref 150.0–400.0)
RBC: 4.07 Mil/uL — ABNORMAL LOW (ref 4.22–5.81)
RDW: 13.8 % (ref 11.5–15.5)
WBC: 6.2 10*3/uL (ref 4.0–10.5)

## 2018-11-21 LAB — BASIC METABOLIC PANEL
BUN: 34 mg/dL — ABNORMAL HIGH (ref 6–23)
CO2: 25 mEq/L (ref 19–32)
Calcium: 8.6 mg/dL (ref 8.4–10.5)
Chloride: 100 mEq/L (ref 96–112)
Creatinine, Ser: 1.5 mg/dL (ref 0.40–1.50)
GFR: 45.01 mL/min — ABNORMAL LOW (ref >60.00)
Glucose, Bld: 213 mg/dL — ABNORMAL HIGH (ref 70–99)
Potassium: 5 mEq/L (ref 3.5–5.1)
Sodium: 134 mEq/L — ABNORMAL LOW (ref 135–145)

## 2018-11-21 LAB — URINALYSIS, ROUTINE W REFLEX MICROSCOPIC
Bilirubin Urine: NEGATIVE
Hgb urine dipstick: NEGATIVE
Leukocytes,Ua: NEGATIVE
Nitrite: NEGATIVE
Specific Gravity, Urine: >=1.03 — AB (ref 1.000–1.030)
Urine Glucose: NEGATIVE
Urobilinogen, UA: 0.2 (ref 0.0–1.0)
pH: 5.5 (ref 5.0–8.0)

## 2018-11-21 LAB — MICROALBUMIN / CREATININE URINE RATIO
Creatinine,U: 172.9 mg/dL
Microalb Creat Ratio: 4.6 mg/g (ref 0.0–30.0)
Microalb, Ur: 7.9 mg/dL — ABNORMAL HIGH (ref 0.0–1.9)

## 2018-11-21 LAB — HEMOGLOBIN A1C: Hgb A1c MFr Bld: 7.9 % — ABNORMAL HIGH (ref 4.6–6.5)

## 2018-11-21 LAB — VITAMIN D 25 HYDROXY (VIT D DEFICIENCY, FRACTURES): VITD: 27.11 ng/mL — ABNORMAL LOW (ref 30.00–100.00)

## 2018-11-21 MED ORDER — CEFDINIR 300 MG PO CAPS: 300.0000 mg | ORAL_CAPSULE | Freq: Two times a day (BID) | ORAL | 0 refills | Status: AC

## 2018-11-21 MED ORDER — CHOLECALCIFEROL 50 MCG (2000 UT) PO TABS: 1.0000 | ORAL_TABLET | Freq: Every day | ORAL | 1 refills | Status: DC

## 2018-11-21 MED ORDER — CYANOCOBALAMIN 1000 MCG/ML IJ SOLN
1000.0000 ug | Freq: Once | INTRAMUSCULAR | Status: AC
  Administered 2018-11-21: 1000 ug via INTRAMUSCULAR

## 2018-11-21 MED ORDER — CANAGLIFLOZIN-METFORMIN HCL ER 150-1000 MG PO TB24: 1.0000 | ORAL_TABLET | Freq: Every day | ORAL | 1 refills | Status: DC

## 2018-11-21 NOTE — Progress Notes (Signed)
Subjective:  Patient ID: Frank [name], male    DOB: 1940-11-30  Age: 78 y.o. MRN: [id number]  CC: Cough; Diabetes; and Anemia   HPI Frank [name] presents for f/up - Frank [name] complains of a several month history of cough that is productive of thick yellow/green phlegm.  Frank [name] has his baseline shortness of breath that has not worsened.  Frank [name] denies fever, chills, night sweats, hemoptysis, or wheezing.  Outpatient Medications Prior to Visit  Medication Sig Dispense Refill  . amLODipine (NORVASC) 10 MG tablet Take 1 tablet (10 mg total) by mouth daily. 90 tablet 1  . atorvastatin (LIPITOR) 20 MG tablet Take 0.5 tablets (10 mg total) by mouth daily. 45 tablet 1  . Carbonyl Iron 25 MG TABS Take 25 mg by mouth daily.    . cetirizine (ZYRTEC) 10 MG tablet Take 10 mg by mouth at bedtime.     [name] doxazosin (CARDURA) 4 MG tablet Take 0.5 tablets (2 mg total) by mouth at bedtime. 45 tablet 1  . FLUoxetine (PROZAC) 20 MG capsule TAKE 1 CAPSULE BY MOUTH  DAILY 90 capsule 1  . fluticasone (FLONASE) 50 MCG/ACT nasal spray Place 1 spray into both nostrils daily as needed for allergies or rhinitis.    [name] oxybutynin (DITROPAN) 5 MG tablet TAKE 1 TABLET BY MOUTH TWO  TIMES DAILY 180 tablet 1  . pantoprazole (PROTONIX) 40 MG tablet Take 1 tablet (40 mg total) by mouth daily. 90 tablet 1  . telmisartan (MICARDIS) 80 MG tablet TAKE 1 TABLET BY MOUTH  DAILY 90 tablet 1  . glipiZIDE (GLUCOTROL XL) 10 MG 24 hr tablet Take 1 tablet (10 mg total) by mouth daily. 90 tablet 1  . metFORMIN (GLUCOPHAGE) 1000 MG tablet TAKE 1 TABLET (1000mg ) BY MOUTH TWO  TIMES DAILY 180 tablet 1   No facility-administered medications prior to visit.     ROS Review of Systems  Constitutional: Negative.  Negative for chills, diaphoresis, fatigue and fever.  HENT: Negative.   Respiratory: Positive for cough. Negative for wheezing.   Cardiovascular: Negative for chest pain, palpitations and leg swelling.  Gastrointestinal:  Negative for abdominal pain, constipation, diarrhea, nausea and vomiting.  Endocrine: Positive for polyphagia. Negative for polydipsia and polyuria.  Genitourinary: Negative.  Negative for difficulty urinating.  Musculoskeletal: Negative.  Negative for arthralgias and myalgias.  Skin: Negative.  Negative for pallor.  Neurological: Negative.  Negative for dizziness, weakness and light-headedness.  Hematological: Negative for adenopathy. Does not bruise/bleed easily.  Psychiatric/Behavioral: Negative.     Objective:  BP (!) 132/56 (BP Location: Left Arm, Patient Position: Sitting, Cuff Size: Large)   Pulse 77   Temp 98.4 F (36.9 C) (Oral)   Resp 16   Ht 5\' 10"  (1.778 m)   Wt 213 lb (96.6 kg)   SpO2 95%   BMI 30.56 kg/m   BP Readings from Last 3 Encounters:  11/21/18 (!) 132/56  05/25/18 132/70  02/22/18 134/78    Wt Readings from Last 3 Encounters:  11/21/18 213 lb (96.6 kg)  05/25/18 217 lb 4 oz (98.5 kg)  02/22/18 217 lb (98.4 kg)    Physical Exam Constitutional:      General: Frank [name] is not in acute distress.    Appearance: Frank [name] is obese. Frank [name] is not ill-appearing, toxic-appearing or diaphoretic.  HENT:     Nose: Nose normal. No congestion.     Mouth/Throat:     Pharynx: Oropharynx is clear. No oropharyngeal exudate or posterior oropharyngeal erythema.  Eyes:  General: No scleral icterus.    Conjunctiva/sclera: Conjunctivae normal.  Neck:     Musculoskeletal: Normal range of motion and neck supple.  Cardiovascular:     Rate and Rhythm: Normal rate and regular rhythm.     Heart sounds: No murmur.  Pulmonary:     Effort: Pulmonary effort is normal. No respiratory distress.     Breath sounds: No stridor. No wheezing, rhonchi or rales.  Abdominal:     General: Abdomen is flat.     Palpations: There is no hepatomegaly, splenomegaly or mass.     Tenderness: There is no abdominal tenderness. There is no guarding.  Musculoskeletal: Normal range of motion.        General:  No swelling.     Right lower leg: No edema.     Left lower leg: No edema.  Lymphadenopathy:     Cervical: No cervical adenopathy.  Skin:    General: Skin is warm and dry.     Coloration: Skin is not pale.  Neurological:     General: No focal deficit present.     Lab Results  Component Value Date   WBC 6.2 11/21/2018   HGB 11.0 (L) 11/21/2018   HCT 33.5 (L) 11/21/2018   PLT 180.0 11/21/2018   GLUCOSE 213 (H) 11/21/2018   CHOL 146 05/25/2018   TRIG 158.0 (H) 05/25/2018   HDL 46.10 05/25/2018   LDLCALC 69 05/25/2018   ALT 19 08/04/2017   AST 20 08/04/2017   NA 134 (L) 11/21/2018   K 5.0 11/21/2018   CL 100 11/21/2018   CREATININE 1.50 11/21/2018   BUN 34 (H) 11/21/2018   CO2 25 11/21/2018   TSH 2.02 05/25/2018   PSA 1.08 07/10/2015   INR 0.98 08/04/2017   HGBA1C 7.9 (H) 11/21/2018   MICROALBUR 7.9 (H) 11/21/2018    Dg Chest 2 View  Result Date: 11/21/2018 CLINICAL DATA:  Cough EXAM: CHEST - 2 VIEW COMPARISON:  03/18/2016 FINDINGS: There is an old deformity of the fifth rib anterior laterally on the right. The cardiac silhouette is mildly enlarged. There is no pneumothorax. There is an old healed fracture of the right clavicle. The lungs are somewhat hyperexpanded with persistent blunting of the right costophrenic angle. Aortic calcifications are noted. IMPRESSION: 1. No acute cardiopulmonary process identified. 2. Chronic changes as above. Electronically Signed   By: Constance Holster M.D.   On: 11/21/2018 15:46    Assessment & Plan:   Herndon was seen today for cough, diabetes and anemia.  Diagnoses and all orders for this visit:  Essential hypertension- His blood pressure is adequately well controlled. -     Basic metabolic panel; Future -     Urinalysis, Routine w reflex microscopic; Future  Type 2 diabetes mellitus with peripheral neuropathy ([place])- His A1c is up to 7.9% and Frank [name] has microalbuminuria.  I have asked him to stop taking the sulfonylurea due to  concerns about weight gain and hypoglycemia.  I would like for him to change to an SGLT2 inhibitor for cardiovascular and renal benefits.  Will continue metformin. -     Basic metabolic panel; Future -     Urinalysis, Routine w reflex microscopic; Future -     Hemoglobin A1c; Future -     Microalbumin / creatinine urine ratio; Future -     Canagliflozin-metFORMIN HCl ER (INVOKAMET XR) [phone] MG TB24; Take 1 tablet by mouth daily.  Vitamin B12 deficiency anemia due to intrinsic factor deficiency- His H&H have declined  some.  Frank [name] has been taking an oral B12 supplement for the last month.  I have asked him to return to a B12 injection every 1 to 2 months. -     CBC with Differential/Platelet; Future -     cyanocobalamin ((VITAMIN B-12)) injection 1,000 mcg  RTI (respiratory tract infection)- I will treat the infection with a broad-spectrum cephalosporin antibiotic. -     cefdinir (OMNICEF) 300 MG capsule; Take 1 capsule (300 mg total) by mouth 2 (two) times daily for 10 days.  Cough- His chest x-ray is negative for mass or infiltrate. -     DG Chest 2 View; Future  Vitamin D deficiency- Frank [name] is not taking a vitamin D supplement and his vitamin D level is low again.  I have asked him to restart a vitamin D supplement. -     VITAMIN D 25 Hydroxy (Vit-D Deficiency, Fractures); Future -     Cholecalciferol 50 MCG (2000 UT) TABS; Take 1 tablet (2,000 Units total) by mouth daily.  Chronic renal disease, stage 3, moderately decreased glomerular filtration rate (GFR) between 30-59 mL/min/1.73 square meter ([place])- His renal function has declined slightly.  I have asked him to start taking an SGLT2 inhibitor for renal protection.  I have also asked him to avoid nephrotoxic agents.  Microalbuminuria due to type 2 diabetes mellitus ([name]) -     Canagliflozin-metFORMIN HCl ER (INVOKAMET XR) [phone] MG TB24; Take 1 tablet by mouth daily.   I have discontinued [name] [name] "ken"'s metFORMIN and  glipiZIDE. I am also having him start on cefdinir, Cholecalciferol, and Canagliflozin-metFORMIN HCl ER. Additionally, I am having him maintain his cetirizine, Carbonyl Iron, fluticasone, oxybutynin, FLUoxetine, amLODipine, atorvastatin, doxazosin, pantoprazole, and telmisartan. We administered cyanocobalamin.  Meds ordered this encounter  Medications  . cefdinir (OMNICEF) 300 MG capsule    Sig: Take 1 capsule (300 mg total) by mouth 2 (two) times daily for 10 days.    Dispense:  20 capsule    Refill:  0  . cyanocobalamin ((VITAMIN B-12)) injection 1,000 mcg  . Cholecalciferol 50 MCG (2000 UT) TABS    Sig: Take 1 tablet (2,000 Units total) by mouth daily.    Dispense:  90 tablet    Refill:  1  . Canagliflozin-metFORMIN HCl ER (INVOKAMET XR) [phone] MG TB24    Sig: Take 1 tablet by mouth daily.    Dispense:  90 tablet    Refill:  1     Follow-up: Return in about 4 weeks (around 12/19/2018).  Scarlette Calico, MD

## 2018-11-21 NOTE — Patient Instructions (Signed)
Cough, Adult  Coughing is a reflex that clears your throat and your airways. Coughing helps to heal and protect your lungs. It is normal to cough occasionally, but a cough that happens with other symptoms or lasts a long time may be a sign of a condition that needs treatment. A cough may last only 2-3 weeks (acute), or it may last longer than 8 weeks (chronic). What are the causes? Coughing is commonly caused by:  Breathing in substances that irritate your lungs.  A viral or bacterial respiratory infection.  Allergies.  Asthma.  Postnasal drip.  Smoking.  Acid backing up from the stomach into the esophagus (gastroesophageal reflux).  Certain medicines.  Chronic lung problems, including COPD (or rarely, lung cancer).  Other medical conditions such as heart failure. Follow these instructions at home: Pay attention to any changes in your symptoms. Take these actions to help with your discomfort:  Take medicines only as told by your health care provider. ? If you were prescribed an antibiotic medicine, take it as told by your health care provider. Do not stop taking the antibiotic even if you start to feel better. ? Talk with your health care provider before you take a cough suppressant medicine.  Drink enough fluid to keep your urine clear or pale yellow.  If the air is dry, use a cold steam vaporizer or humidifier in your bedroom or your home to help loosen secretions.  Avoid anything that causes you to cough at work or at home.  If your cough is worse at night, try sleeping in a semi-upright position.  Avoid cigarette smoke. If you smoke, quit smoking. If you need help quitting, ask your health care provider.  Avoid caffeine.  Avoid alcohol.  Rest as needed. Contact a health care provider if:  You have new symptoms.  You cough up pus.  Your cough does not get better after 2-3 weeks, or your cough gets worse.  You cannot control your cough with suppressant  medicines and you are losing sleep.  You develop pain that is getting worse or pain that is not controlled with pain medicines.  You have a fever.  You have unexplained weight loss.  You have night sweats. Get help right away if:  You cough up blood.  You have difficulty breathing.  Your heartbeat is very fast. This information is not intended to replace advice given to you by your health care provider. Make sure you discuss any questions you have with your health care provider. Document Released: 12/26/2010 Document Revised: 12/05/2015 Document Reviewed: 09/05/2014 Elsevier Interactive Patient Education  2019 [employer]  

## 2018-11-22 MED ORDER — CANAGLIFLOZIN-METFORMIN HCL ER 150-1000 MG PO TB24: 1.0000 | ORAL_TABLET | Freq: Every day | ORAL | 1 refills | Status: DC

## 2018-11-22 NOTE — Telephone Encounter (Signed)
Should pt take the glimiperide until Invokamet comes in the mail?

## 2018-11-24 ENCOUNTER — Ambulatory Visit: Payer: Self-pay | Admitting: Internal Medicine

## 2018-11-30 ENCOUNTER — Ambulatory Visit (INDEPENDENT_AMBULATORY_CARE_PROVIDER_SITE_OTHER): Payer: Medicare Other | Admitting: Internal Medicine

## 2018-11-30 ENCOUNTER — Other Ambulatory Visit: Payer: Self-pay

## 2018-11-30 ENCOUNTER — Encounter: Payer: Self-pay | Admitting: Internal Medicine

## 2018-11-30 ENCOUNTER — Other Ambulatory Visit (INDEPENDENT_AMBULATORY_CARE_PROVIDER_SITE_OTHER): Payer: Medicare Other

## 2018-11-30 VITALS — BP 104/42 | HR 67 | Temp 97.4°F | Resp 16 | Ht 70.0 in | Wt 213.0 lb

## 2018-11-30 DIAGNOSIS — E119 Type 2 diabetes mellitus without complications: Secondary | ICD-10-CM

## 2018-11-30 DIAGNOSIS — D696 Thrombocytopenia, unspecified: Secondary | ICD-10-CM

## 2018-11-30 DIAGNOSIS — E1142 Type 2 diabetes mellitus with diabetic polyneuropathy: Secondary | ICD-10-CM | POA: Diagnosis not present

## 2018-11-30 DIAGNOSIS — N183 Chronic kidney disease, stage 3 unspecified: Secondary | ICD-10-CM

## 2018-11-30 DIAGNOSIS — D510 Vitamin B12 deficiency anemia due to intrinsic factor deficiency: Secondary | ICD-10-CM

## 2018-11-30 DIAGNOSIS — I10 Essential (primary) hypertension: Secondary | ICD-10-CM

## 2018-11-30 DIAGNOSIS — J301 Allergic rhinitis due to pollen: Secondary | ICD-10-CM | POA: Diagnosis not present

## 2018-11-30 LAB — CBC WITH DIFFERENTIAL/PLATELET
Basophils Absolute: 0 10*3/uL (ref 0.0–0.1)
Basophils Relative: 0.3 % (ref 0.0–3.0)
Eosinophils Absolute: 0.3 10*3/uL (ref 0.0–0.7)
Eosinophils Relative: 6.7 % — ABNORMAL HIGH (ref 0.0–5.0)
HCT: 33.2 % — ABNORMAL LOW (ref 39.0–52.0)
Hemoglobin: 11 g/dL — ABNORMAL LOW (ref 13.0–17.0)
Lymphocytes Relative: 18.1 % (ref 12.0–46.0)
Lymphs Abs: 0.9 10*3/uL (ref 0.7–4.0)
MCHC: 33.2 g/dL (ref 30.0–36.0)
MCV: 81.4 fl (ref 78.0–100.0)
Monocytes Absolute: 0.8 10*3/uL (ref 0.1–1.0)
Monocytes Relative: 17 % — ABNORMAL HIGH (ref 3.0–12.0)
Neutro Abs: 2.7 10*3/uL (ref 1.4–7.7)
Neutrophils Relative %: 57.9 % (ref 43.0–77.0)
Platelets: 93 10*3/uL — ABNORMAL LOW (ref 150.0–400.0)
RBC: 4.08 Mil/uL — ABNORMAL LOW (ref 4.22–5.81)
RDW: 14.2 % (ref 11.5–15.5)
WBC: 4.7 10*3/uL (ref 4.0–10.5)

## 2018-11-30 LAB — BASIC METABOLIC PANEL
BUN: 33 mg/dL — ABNORMAL HIGH (ref 6–23)
CO2: 24 mEq/L (ref 19–32)
Calcium: 8.7 mg/dL (ref 8.4–10.5)
Chloride: 99 mEq/L (ref 96–112)
Creatinine, Ser: 1.82 mg/dL — ABNORMAL HIGH (ref 0.40–1.50)
GFR: 36.01 mL/min — ABNORMAL LOW (ref >60.00)
Glucose, Bld: 143 mg/dL — ABNORMAL HIGH (ref 70–99)
Potassium: 4.1 mEq/L (ref 3.5–5.1)
Sodium: 134 mEq/L — ABNORMAL LOW (ref 135–145)

## 2018-11-30 LAB — FOLATE: Folate: 13.5 ng/mL (ref >5.9)

## 2018-11-30 LAB — VITAMIN B12: Vitamin B-12: 1104 pg/mL — ABNORMAL HIGH (ref 211–911)

## 2018-11-30 MED ORDER — AZELASTINE HCL 0.1 % NA SOLN: 1.0000 | Freq: Two times a day (BID) | NASAL | 1 refills | Status: DC

## 2018-11-30 MED ORDER — METFORMIN HCL 1000 MG PO TABS: 1000.0000 mg | ORAL_TABLET | Freq: Two times a day (BID) | ORAL | 1 refills | Status: DC

## 2018-11-30 MED ORDER — LEVOCETIRIZINE DIHYDROCHLORIDE 5 MG PO TABS: 5.0000 mg | ORAL_TABLET | Freq: Every evening | ORAL | 1 refills | Status: DC

## 2018-11-30 MED ORDER — GLIPIZIDE ER 10 MG PO TB24: 10.0000 mg | ORAL_TABLET | Freq: Every day | ORAL | 1 refills | Status: DC

## 2018-11-30 NOTE — Progress Notes (Signed)
Subjective:  Patient ID: Frank [name], male    DOB: March 19, 1941  Age: 78 y.o. MRN: [id number]  CC: Diabetes   HPI Frank [name] presents for f/up - His recent A1c was too high at 7.9% so I asked him to transition from metformin and a sulfonylurea to metformin plus an SGLT2 inhibitor.  I did this to help lower his blood sugar, avoid episodes of hypoglycemia, and for cardiovascular risk reduction.  Unfortunately he has not tolerated Invokana well.  He complains of low BP, frequent urination, nocturia, dizziness, and lightheadedness.  Outpatient Medications Prior to Visit  Medication Sig Dispense Refill  . amLODipine (NORVASC) 10 MG tablet Take 1 tablet (10 mg total) by mouth daily. 90 tablet 1  . atorvastatin (LIPITOR) 20 MG tablet Take 0.5 tablets (10 mg total) by mouth daily. 45 tablet 1  . Carbonyl Iron 25 MG TABS Take 25 mg by mouth daily.    . cefdinir (OMNICEF) 300 MG capsule Take 1 capsule (300 mg total) by mouth 2 (two) times daily for 10 days. 20 capsule 0  . Cholecalciferol 50 MCG (2000 UT) TABS Take 1 tablet (2,000 Units total) by mouth daily. 90 tablet 1  . doxazosin (CARDURA) 4 MG tablet Take 0.5 tablets (2 mg total) by mouth at bedtime. 45 tablet 1  . FLUoxetine (PROZAC) 20 MG capsule TAKE 1 CAPSULE BY MOUTH  DAILY 90 capsule 1  . fluticasone (FLONASE) 50 MCG/ACT nasal spray Place 1 spray into both nostrils daily as needed for allergies or rhinitis.    [name] oxybutynin (DITROPAN) 5 MG tablet TAKE 1 TABLET BY MOUTH TWO  TIMES DAILY 180 tablet 1  . pantoprazole (PROTONIX) 40 MG tablet Take 1 tablet (40 mg total) by mouth daily. 90 tablet 1  . telmisartan (MICARDIS) 80 MG tablet TAKE 1 TABLET BY MOUTH  DAILY 90 tablet 1  . Canagliflozin-metFORMIN HCl ER (INVOKAMET XR) [phone] MG TB24 Take 1 tablet by mouth daily. 90 tablet 1  . cetirizine (ZYRTEC) 10 MG tablet Take 10 mg by mouth at bedtime.      No facility-administered medications prior to visit.     ROS Review  of Systems  Constitutional: Negative.  Negative for diaphoresis, fatigue and unexpected weight change.  HENT: Negative.   Eyes: Negative for visual disturbance.  Respiratory: Negative for cough, chest tightness, shortness of breath and wheezing.   Cardiovascular: Negative for chest pain, palpitations and leg swelling.  Gastrointestinal: Negative.  Negative for abdominal pain, constipation, diarrhea, nausea and vomiting.  Endocrine: Positive for polyuria. Negative for polydipsia and polyphagia.  Genitourinary: Positive for frequency. Negative for difficulty urinating, dysuria, hematuria and urgency.  Musculoskeletal: Negative.  Negative for arthralgias and myalgias.  Skin: Negative.  Negative for color change and rash.  Neurological: Positive for dizziness and light-headedness. Negative for weakness.  Hematological: Negative for adenopathy. Does not bruise/bleed easily.  Psychiatric/Behavioral: Negative.     Objective:  BP (!) 104/42 (BP Location: Left Arm, Patient Position: Sitting, Cuff Size: Normal)   Pulse 67   Temp (!) 97.4 F (36.3 C) (Oral)   Resp 16   Ht 5\' 10"  (1.778 m)   Wt 213 lb (96.6 kg)   SpO2 93%   BMI 30.56 kg/m   BP Readings from Last 3 Encounters:  11/30/18 (!) 104/42  11/21/18 (!) 132/56  05/25/18 132/70    Wt Readings from Last 3 Encounters:  11/30/18 213 lb (96.6 kg)  11/21/18 213 lb (96.6 kg)  05/25/18 217 lb 4  oz (98.5 kg)    Physical Exam Vitals signs reviewed.  Constitutional:      Appearance: He is not ill-appearing or diaphoretic.  HENT:     Nose: Nose normal. No congestion or rhinorrhea.     Mouth/Throat:     Mouth: Mucous membranes are moist.     Pharynx: No oropharyngeal exudate.  Eyes:     Conjunctiva/sclera: Conjunctivae normal.  Neck:     Musculoskeletal: Normal range of motion and neck supple. No neck rigidity.  Cardiovascular:     Rate and Rhythm: Normal rate and regular rhythm.     Heart sounds: No murmur. No gallop.    Pulmonary:     Effort: Pulmonary effort is normal.     Breath sounds: No stridor. No wheezing or rales.  Abdominal:     General: Abdomen is flat. Bowel sounds are normal.     Palpations: There is no mass.     Tenderness: There is no abdominal tenderness.  Musculoskeletal: Normal range of motion.     Right lower leg: No edema.     Left lower leg: No edema.  Lymphadenopathy:     Cervical: No cervical adenopathy.  Skin:    General: Skin is warm.     Coloration: Skin is not jaundiced.     Findings: No bruising.  Neurological:     General: No focal deficit present.  Psychiatric:        Mood and Affect: Mood normal.        Behavior: Behavior normal.     Lab Results  Component Value Date   WBC 4.7 11/30/2018   HGB 11.0 (L) 11/30/2018   HCT 33.2 (L) 11/30/2018   PLT 93.0 Repeated and verified X2. (L) 11/30/2018   GLUCOSE 143 (H) 11/30/2018   CHOL 146 05/25/2018   TRIG 158.0 (H) 05/25/2018   HDL 46.10 05/25/2018   LDLCALC 69 05/25/2018   ALT 19 08/04/2017   AST 20 08/04/2017   NA 134 (L) 11/30/2018   K 4.1 11/30/2018   CL 99 11/30/2018   CREATININE 1.82 (H) 11/30/2018   BUN 33 (H) 11/30/2018   CO2 24 11/30/2018   TSH 2.02 05/25/2018   PSA 1.08 07/10/2015   INR 0.98 08/04/2017   HGBA1C 7.9 (H) 11/21/2018   MICROALBUR 7.9 (H) 11/21/2018    Dg Chest 2 View  Result Date: 11/21/2018 CLINICAL DATA:  Cough EXAM: CHEST - 2 VIEW COMPARISON:  03/18/2016 FINDINGS: There is an old deformity of the fifth rib anterior laterally on the right. The cardiac silhouette is mildly enlarged. There is no pneumothorax. There is an old healed fracture of the right clavicle. The lungs are somewhat hyperexpanded with persistent blunting of the right costophrenic angle. Aortic calcifications are noted. IMPRESSION: 1. No acute cardiopulmonary process identified. 2. Chronic changes as above. Electronically Signed   By: Constance Holster M.D.   On: 11/21/2018 15:46    Assessment & Plan:   Dyke  was seen today for diabetes.  Diagnoses and all orders for this visit:  Essential hypertension- His blood pressure is adequately well controlled.  Since starting the SGLT2 inhibitor he has become hypotensive, prerenal, and has had a decline in his renal function.  I have asked him to stop taking the SGLT2 inhibitor. -     Basic metabolic panel; Future  Type 2 diabetes mellitus with peripheral neuropathy ([place])- He is not tolerating the SGLT2 inhibitor so I have asked him to restart metformin and the sulfonylurea. -  Basic metabolic panel; Future -     glipiZIDE (GLIPIZIDE XL) 10 MG 24 hr tablet; Take 1 tablet (10 mg total) by mouth daily. -     metFORMIN (GLUCOPHAGE) 1000 MG tablet; Take 1 tablet (1,000 mg total) by mouth 2 (two) times daily with a meal.  Diabetes mellitus ([name]) -     glipiZIDE (GLIPIZIDE XL) 10 MG 24 hr tablet; Take 1 tablet (10 mg total) by mouth daily. -     metFORMIN (GLUCOPHAGE) 1000 MG tablet; Take 1 tablet (1,000 mg total) by mouth 2 (two) times daily with a meal.  Vitamin B12 deficiency anemia due to intrinsic factor deficiency -     CBC with Differential/Platelet; Future -     Folate; Future -     Vitamin B12; Future  Seasonal allergic rhinitis due to pollen -     levocetirizine (XYZAL) 5 MG tablet; Take 1 tablet (5 mg total) by mouth every evening. -     azelastine (ASTELIN) 0.1 % nasal spray; Place 1 spray into both nostrils 2 (two) times daily. Use in each nostril as directed  Chronic renal disease, stage 3, moderately decreased glomerular filtration rate (GFR) between 30-59 mL/min/1.73 square meter ([place])- He will avoid nephrotoxic agents and will stop taking the SGLT2 inhibitor.  Thrombocytopenia ([place])- His anemia is stable but he has developed low platelets and an elevated monocyte count.  I have asked him to see hematology to be evaluated for lymphoproliferative disease. -     Ambulatory referral to Hematology   I have discontinued [name] [name]  "ken"'s cetirizine and Canagliflozin-metFORMIN HCl ER. I have also changed his metFORMIN. Additionally, I am having him start on levocetirizine and azelastine. Lastly, I am having him maintain his Carbonyl Iron, fluticasone, oxybutynin, FLUoxetine, amLODipine, atorvastatin, doxazosin, pantoprazole, telmisartan, Cholecalciferol, and glipiZIDE.  Meds ordered this encounter  Medications  . glipiZIDE (GLIPIZIDE XL) 10 MG 24 hr tablet    Sig: Take 1 tablet (10 mg total) by mouth daily.    Dispense:  90 tablet    Refill:  1  . metFORMIN (GLUCOPHAGE) 1000 MG tablet    Sig: Take 1 tablet (1,000 mg total) by mouth 2 (two) times daily with a meal.    Dispense:  180 tablet    Refill:  1  . levocetirizine (XYZAL) 5 MG tablet    Sig: Take 1 tablet (5 mg total) by mouth every evening.    Dispense:  90 tablet    Refill:  1  . azelastine (ASTELIN) 0.1 % nasal spray    Sig: Place 1 spray into both nostrils 2 (two) times daily. Use in each nostril as directed    Dispense:  90 mL    Refill:  1     Follow-up: Return in about 6 months (around 06/02/2019).  Scarlette Calico, MD

## 2018-11-30 NOTE — Patient Instructions (Signed)
Type 2 Diabetes Mellitus, Diagnosis, Adult Type 2 diabetes (type 2 diabetes mellitus) is a long-term (chronic) disease. In type 2 diabetes, one or both of these problems may be present:  The pancreas does not make enough of a hormone called insulin.  Cells in the body do not respond properly to insulin that the body makes (insulin resistance). Normally, insulin allows blood sugar (glucose) to enter cells in the body. The cells use glucose for energy. Insulin resistance or lack of insulin causes excess glucose to build up in the blood instead of going into cells. As a result, high blood glucose (hyperglycemia) develops. What increases the risk? The following factors may make you more likely to develop type 2 diabetes:  Having a family member with type 2 diabetes.  Being overweight or obese.  Having an inactive (sedentary) lifestyle.  Having been diagnosed with insulin resistance.  Having a history of prediabetes, gestational diabetes, or polycystic ovary syndrome (PCOS).  Being of American-Indian, African-American, Hispanic/Latino, or Asian/Pacific Islander descent. What are the signs or symptoms? In the early stage of this condition, you may not have symptoms. Symptoms develop slowly and may include:  Increased thirst (polydipsia).  Increased hunger(polyphagia).  Increased urination (polyuria).  Increased urination during the night (nocturia).  Unexplained weight loss.  Frequent infections that keep coming back (recurring).  Fatigue.  Weakness.  Vision changes, such as blurry vision.  Cuts or bruises that are slow to heal.  Tingling or numbness in the hands or feet.  Dark patches on the skin (acanthosis nigricans). How is this diagnosed? This condition is diagnosed based on your symptoms, your medical history, a physical exam, and your blood glucose level. Your blood glucose may be checked with one or more of the following blood tests:  A fasting blood glucose (FBG)  test. You will not be allowed to eat (you will fast) for 8 hours or longer before a blood sample is taken.  A random blood glucose test. This test checks blood glucose at any time of day regardless of when you ate.  An A1c (hemoglobin A1c) blood test. This test provides information about blood glucose control over the previous 2-3 months.  An oral glucose tolerance test (OGTT). This test measures your blood glucose at two times: ? After fasting. This is your baseline blood glucose level. ? Two hours after drinking a beverage that contains glucose. You may be diagnosed with type 2 diabetes if:  Your FBG level is 126 mg/dL (7.0 mmol/L) or higher.  Your random blood glucose level is 200 mg/dL (11.1 mmol/L) or higher.  Your A1c level is 6.5% or higher.  Your OGTT result is higher than 200 mg/dL (11.1 mmol/L). These blood tests may be repeated to confirm your diagnosis. How is this treated? Your treatment may be managed by a specialist called an endocrinologist. Type 2 diabetes may be treated by following instructions from your health care provider about:  Making diet and lifestyle changes. This may include: ? Following an individualized nutrition plan that is developed by a diet and nutrition specialist (registered dietitian). ? Exercising regularly. ? Finding ways to manage stress.  Checking your blood glucose level as often as told.  Taking diabetes medicines or insulin daily. This helps to keep your blood glucose levels in the healthy range. ? If you use insulin, you may need to adjust the dosage depending on how physically active you are and what foods you eat. Your health care provider will tell you how to adjust your dosage.    Taking medicines to help prevent complications from diabetes, such as: ? Aspirin. ? Medicine to lower cholesterol. ? Medicine to control blood pressure. Your health care provider will set individualized treatment goals for you. Your goals will be based on  your age, other medical conditions you have, and how you respond to diabetes treatment. Generally, the goal of treatment is to maintain the following blood glucose levels:  Before meals (preprandial): 80-130 mg/dL (4.4-7.2 mmol/L).  After meals (postprandial): below 180 mg/dL (10 mmol/L).  A1c level: less than 7%. Follow these instructions at home: Questions to ask your health care provider  Consider asking the following questions: ? Do I need to meet with a diabetes educator? ? Where can I find a support group for people with diabetes? ? What equipment will I need to manage my diabetes at home? ? What diabetes medicines do I need, and when should I take them? ? How often do I need to check my blood glucose? ? What number can I call if I have questions? ? When is my next appointment? General instructions  Take over-the-counter and prescription medicines only as told by your health care provider.  Keep all follow-up visits as told by your health care provider. This is important.  For more information about diabetes, visit: ? American Diabetes Association (ADA): www.diabetes.org ? American Association of Diabetes Educators (AADE): www.diabeteseducator.org Contact a health care provider if:  Your blood glucose is at or above 240 mg/dL (13.3 mmol/L) for 2 days in a row.  You have been sick or have had a fever for 2 days or longer, and you are not getting better.  You have any of the following problems for more than 6 hours: ? You cannot eat or drink. ? You have nausea and vomiting. ? You have diarrhea. Get help right away if:  Your blood glucose is lower than 54 mg/dL (3.0 mmol/L).  You become confused or you have trouble thinking clearly.  You have difficulty breathing.  You have moderate or large ketone levels in your urine. Summary  Type 2 diabetes (type 2 diabetes mellitus) is a long-term (chronic) disease. In type 2 diabetes, the pancreas does not make enough of a  hormone called insulin, or cells in the body do not respond properly to insulin that the body makes (insulin resistance).  This condition is treated by making diet and lifestyle changes and taking diabetes medicines or insulin.  Your health care provider will set individualized treatment goals for you. Your goals will be based on your age, other medical conditions you have, and how you respond to diabetes treatment.  Keep all follow-up visits as told by your health care provider. This is important. This information is not intended to replace advice given to you by your health care provider. Make sure you discuss any questions you have with your health care provider. Document Released: 06/29/2005 Document Revised: 01/28/2017 Document Reviewed: 08/02/2015 Elsevier Interactive Patient Education  2019 [employer]  

## 2018-12-07 ENCOUNTER — Telehealth: Payer: Self-pay | Admitting: Hematology and Oncology

## 2018-12-07 NOTE — Telephone Encounter (Signed)
A new hem appt has been scheduled for the pt to see Dr. Lindi Adie on 6/1 at 230pm. Pt has been made aware to arrive 15 minutes early.

## 2018-12-12 ENCOUNTER — Inpatient Hospital Stay: Payer: Medicare Other

## 2018-12-12 ENCOUNTER — Other Ambulatory Visit: Payer: Self-pay

## 2018-12-12 ENCOUNTER — Inpatient Hospital Stay: Payer: Medicare Other | Admitting: Hematology and Oncology

## 2018-12-12 ENCOUNTER — Inpatient Hospital Stay: Payer: Medicare Other | Attending: Hematology and Oncology | Admitting: Hematology and Oncology

## 2018-12-12 DIAGNOSIS — Z79899 Other long term (current) drug therapy: Secondary | ICD-10-CM | POA: Insufficient documentation

## 2018-12-12 DIAGNOSIS — E785 Hyperlipidemia, unspecified: Secondary | ICD-10-CM | POA: Insufficient documentation

## 2018-12-12 DIAGNOSIS — E1140 Type 2 diabetes mellitus with diabetic neuropathy, unspecified: Secondary | ICD-10-CM | POA: Diagnosis not present

## 2018-12-12 DIAGNOSIS — Z87891 Personal history of nicotine dependence: Secondary | ICD-10-CM | POA: Insufficient documentation

## 2018-12-12 DIAGNOSIS — K219 Gastro-esophageal reflux disease without esophagitis: Secondary | ICD-10-CM | POA: Diagnosis not present

## 2018-12-12 DIAGNOSIS — D696 Thrombocytopenia, unspecified: Secondary | ICD-10-CM

## 2018-12-12 DIAGNOSIS — M1990 Unspecified osteoarthritis, unspecified site: Secondary | ICD-10-CM | POA: Diagnosis not present

## 2018-12-12 DIAGNOSIS — B1910 Unspecified viral hepatitis B without hepatic coma: Secondary | ICD-10-CM | POA: Diagnosis not present

## 2018-12-12 DIAGNOSIS — G4733 Obstructive sleep apnea (adult) (pediatric): Secondary | ICD-10-CM | POA: Diagnosis not present

## 2018-12-12 DIAGNOSIS — Z7984 Long term (current) use of oral hypoglycemic drugs: Secondary | ICD-10-CM | POA: Diagnosis not present

## 2018-12-12 DIAGNOSIS — I10 Essential (primary) hypertension: Secondary | ICD-10-CM | POA: Insufficient documentation

## 2018-12-12 LAB — CBC WITH DIFFERENTIAL (CANCER CENTER ONLY)
Abs Immature Granulocytes: 0.01 10*3/uL (ref 0.00–0.07)
Basophils Absolute: 0.1 10*3/uL (ref 0.0–0.1)
Basophils Relative: 1 %
Eosinophils Absolute: 0.2 10*3/uL (ref 0.0–0.5)
Eosinophils Relative: 3 %
HCT: 32.1 % — ABNORMAL LOW (ref 39.0–52.0)
Hemoglobin: 10.2 g/dL — ABNORMAL LOW (ref 13.0–17.0)
Immature Granulocytes: 0 %
Lymphocytes Relative: 25 %
Lymphs Abs: 1.4 10*3/uL (ref 0.7–4.0)
MCH: 26.6 pg (ref 26.0–34.0)
MCHC: 31.8 g/dL (ref 30.0–36.0)
MCV: 83.8 fL (ref 80.0–100.0)
Monocytes Absolute: 0.7 10*3/uL (ref 0.1–1.0)
Monocytes Relative: 13 %
Neutro Abs: 3.4 10*3/uL (ref 1.7–7.7)
Neutrophils Relative %: 58 %
Platelet Count: 160 10*3/uL (ref 150–400)
RBC: 3.83 MIL/uL — ABNORMAL LOW (ref 4.22–5.81)
RDW: 13.6 % (ref 11.5–15.5)
WBC Count: 5.8 10*3/uL (ref 4.0–10.5)
nRBC: 0 % (ref 0.0–0.2)

## 2018-12-12 LAB — PLATELET BY CITRATE

## 2018-12-12 NOTE — Assessment & Plan Note (Signed)
Mild thrombocytopenia: Platelet count 93 on 11/30/2018; 180 a month ago Remainder of the CBC with differential was normal, hemoglobin 11, WBC 4.7 with normal differential  Differential diagnosis: We need to rule out spurious thrombocytopenia. 1. Low-grade ITP 2. medication induced: On further review patient is not taking any medications that are associated with thrombocytopenia 3. Bone marrow factors 4. Hepatitis B/C 5. Splenomegaly: No enlarged spleen was palpable to physical exam.  I discussed with the patient that the level of thrombocytopenia is very mild and that these levels, there are usually no adverse effects. There is usually no risk of bleeding and hence it can be observed without making any changes to patient's medications or requiring any further investigations like bone marrow biopsies.  I recommended watchful monitoring.  I will call him with results of today's CBC and we will decide further steps.

## 2018-12-12 NOTE — Progress Notes (Signed)
[place] NOTE  Patient Care Team: Janith Lima, MD as PCP - General (Internal Medicine) [name], MD as Consulting Physician (Neurosurgery)  CHIEF COMPLAINTS/PURPOSE OF CONSULTATION:  Thrombocytopenia  HISTORY OF PRESENTING ILLNESS:  Frank [name] 78 y.o. male is here because of recent diagnosis of thrombocytopenia with a platelet count 93.  Patient has had extensive chronic medical problems including diabetes hyperlipidemia and hypertension.  Recently he has had additional problems with his blood sugars where he could not tolerate diabetic medication and had to be in the hospital.  He is feeling a lot better.  He was noted on the most recent blood work to have a platelet count 93 which is verified and felt to be accurate.  He was sent to [place] for discussion regarding testing and investigations.  He has chronic bruising from 10 skin but otherwise without any problems with bleeding issues.  I reviewed her records extensively and collaborated the history with the patient.  SUMMARY OF ONCOLOGIC HISTORY:  No history exists.     MEDICAL HISTORY:  Past Medical History:  Diagnosis Date  . Allergy    Rhinitis  . Anemia    NOS iron deficient and B12 deficient  . Arthritis   . Diabetes mellitus    Type 2  . GERD (gastroesophageal reflux disease)   . Hyperlipidemia   . Hypertension   . 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 OSA (obstructive sleep apnea)    cpap  . TBI (traumatic brain injury) [facility])     SURGICAL HISTORY: Past Surgical History:  Procedure Laterality Date  . APPENDECTOMY    . arm fracture Left 2011   with hardware  . BURR HOLE Bilateral 08/04/2017   Procedure: Haskell Flirt;  Surgeon: [name], MD;  Location: [facility];  Service: Neurosurgery;  Laterality: Bilateral;  . COLONOSCOPY    . CRANIOTOMY Left 12/01/2017   Procedure: Trudee Kuster Holes CRANIOTOMY HEMATOMA EVACUATION SUBDURAL;  Surgeon: [name], MD;  Location: [place];  Service:  Neurosurgery;  Laterality: Left;  . ESOPHAGOGASTRODUODENOSCOPY  2006   gastritis  . ROTATOR CUFF REPAIR    . SEPTOPLASTY  1959   Deviated Septum  . 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 Marital status: Married    Spouse name: Not on file  . Number of children: 1  . Years of education: Not on file  . Highest education level: Not on file  Occupational History  . Not on file  Social Needs  . Financial resource strain: Not hard at all  . Food insecurity:    Worry: Never true    Inability: Never true  . Transportation needs:    Medical: No    Non-medical: No  Tobacco Use  . Smoking status: Former Smoker    Packs/day: 1.00    Last attempt to quit: 07/14/1983    Years since quitting: 35.4  . Smokeless tobacco: Never Used  Substance and Sexual Activity  . Alcohol use: Yes    Alcohol/week: 2.0 standard drinks    Types: 2 Standard drinks or equivalent per week    Comment: occassionally  . Drug use: No  . Sexual activity: Never  Lifestyle  . Physical activity:    Days per week: 0 days    Minutes per session: 0 min  . Stress: Not at all  Relationships  . Social connections:    Talks on phone: More  than three times a week    Gets together: More than three times a week    Attends religious service: Not on file    Active member of club or organization: Not on file    Attends meetings of clubs or organizations: Not on file    Relationship status: Married  . Intimate partner violence:    Fear of current or ex partner: No    Emotionally abused: No    Physically abused: No    Forced sexual activity: No  Other Topics Concern  . Not on file  Social History Narrative  . Not on file    FAMILY HISTORY: Family History  Problem Relation Age of Onset  . Heart disease Mother   . Breast cancer Mother   . Stroke Father   . Allergies Father        Father and children  . Coronary artery disease Brother   . Diabetes Neg  Hx   . Colon cancer Neg Hx     ALLERGIES:  is allergic to lisinopril; penicillins; and sulfamethoxazole.  MEDICATIONS:  Current Outpatient Medications  Medication Sig Dispense Refill  . amLODipine (NORVASC) 10 MG tablet Take 1 tablet (10 mg total) by mouth daily. 90 tablet 1  . atorvastatin (LIPITOR) 20 MG tablet Take 0.5 tablets (10 mg total) by mouth daily. 45 tablet 1  . azelastine (ASTELIN) 0.1 % nasal spray Place 1 spray into both nostrils 2 (two) times daily. Use in each nostril as directed 90 mL 1  . Carbonyl Iron 25 MG TABS Take 25 mg by mouth daily.    . Cholecalciferol 50 MCG (2000 UT) TABS Take 1 tablet (2,000 Units total) by mouth daily. 90 tablet 1  . doxazosin (CARDURA) 4 MG tablet Take 0.5 tablets (2 mg total) by mouth at bedtime. 45 tablet 1  . FLUoxetine (PROZAC) 20 MG capsule TAKE 1 CAPSULE BY MOUTH  DAILY 90 capsule 1  . fluticasone (FLONASE) 50 MCG/ACT nasal spray Place 1 spray into both nostrils daily as needed for allergies or rhinitis.    [name] glipiZIDE (GLIPIZIDE XL) 10 MG 24 hr tablet Take 1 tablet (10 mg total) by mouth daily. 90 tablet 1  . levocetirizine (XYZAL) 5 MG tablet Take 1 tablet (5 mg total) by mouth every evening. 90 tablet 1  . metFORMIN (GLUCOPHAGE) 1000 MG tablet Take 1 tablet (1,000 mg total) by mouth 2 (two) times daily with a meal. 180 tablet 1  . oxybutynin (DITROPAN) 5 MG tablet TAKE 1 TABLET BY MOUTH TWO  TIMES DAILY 180 tablet 1  . pantoprazole (PROTONIX) 40 MG tablet Take 1 tablet (40 mg total) by mouth daily. 90 tablet 1  . telmisartan (MICARDIS) 80 MG tablet TAKE 1 TABLET BY MOUTH  DAILY 90 tablet 1   No current facility-administered medications for this visit.     REVIEW OF SYSTEMS:   Constitutional: Denies fevers, chills or abnormal night sweats Eyes: Denies blurriness of vision, double vision or watery eyes Ears, nose, mouth, throat, and face: Denies mucositis or sore throat Respiratory: Denies cough, dyspnea or  wheezes Cardiovascular: Denies palpitation, chest discomfort or lower extremity swelling Gastrointestinal:  Denies nausea, heartburn or change in bowel habits Skin: Denies abnormal skin rashes Lymphatics: Denies new lymphadenopathy or easy bruising Neurological:Denies numbness, tingling or new weaknesses Behavioral/Psych: Mood is stable, no new changes    All other systems were reviewed with the patient and are negative.  PHYSICAL EXAMINATION: ECOG PERFORMANCE STATUS: 2 - Symptomatic, <50% confined  to bed  Vitals:   12/12/18 1235  BP: 130/62  Pulse: 72  Resp: 17  Temp: 98.7 F (37.1 C)  SpO2: 97%   Filed Weights   12/12/18 1235  Weight: 210 lb 3.2 oz (95.3 kg)    Exam not performed due to COVID-19 precautions LABORATORY DATA:  I have reviewed the data as listed Lab Results  Component Value Date   WBC 4.7 11/30/2018   HGB 11.0 (L) 11/30/2018   HCT 33.2 (L) 11/30/2018   MCV 81.4 11/30/2018   PLT 93.0 Repeated and verified X2. (L) 11/30/2018   Lab Results  Component Value Date   NA 134 (L) 11/30/2018   K 4.1 11/30/2018   CL 99 11/30/2018   CO2 24 11/30/2018    RADIOGRAPHIC STUDIES: I have personally reviewed the radiological reports and agreed with the findings in the report.  ASSESSMENT AND PLAN:  Thrombocytopenia ([name]) Mild thrombocytopenia: Platelet count 93 on 11/30/2018; 180 a month ago Remainder of the CBC with differential was normal, hemoglobin 11, WBC 4.7 with normal differential  Differential diagnosis: We need to rule out spurious thrombocytopenia. 1. Low-grade ITP 2. medication induced: On further review patient is not taking any medications that are associated with thrombocytopenia 3. Bone marrow factors 4. Hepatitis B/C 5. Splenomegaly: No enlarged spleen was palpable to physical exam.  On repeat checking his platelet count is 160. Because of this we do not recommend any further investigations. Return to clinic on an as-needed  basis.    All questions were answered. The patient knows to call the clinic with any problems, questions or concerns.    Harriette Ohara, MD 12/12/18

## 2018-12-19 ENCOUNTER — Encounter: Payer: Self-pay | Admitting: Internal Medicine

## 2018-12-19 ENCOUNTER — Other Ambulatory Visit: Payer: Self-pay

## 2018-12-19 ENCOUNTER — Ambulatory Visit (INDEPENDENT_AMBULATORY_CARE_PROVIDER_SITE_OTHER): Payer: Medicare Other | Admitting: Internal Medicine

## 2018-12-19 VITALS — BP 118/60 | HR 76 | Temp 98.3°F | Resp 16 | Ht 70.0 in | Wt 208.0 lb

## 2018-12-19 DIAGNOSIS — D510 Vitamin B12 deficiency anemia due to intrinsic factor deficiency: Secondary | ICD-10-CM | POA: Diagnosis not present

## 2018-12-19 DIAGNOSIS — I10 Essential (primary) hypertension: Secondary | ICD-10-CM

## 2018-12-19 DIAGNOSIS — E1142 Type 2 diabetes mellitus with diabetic polyneuropathy: Secondary | ICD-10-CM | POA: Diagnosis not present

## 2018-12-19 MED ORDER — CYANOCOBALAMIN 1000 MCG/ML IJ SOLN
1000.0000 ug | Freq: Once | INTRAMUSCULAR | Status: AC
  Administered 2018-12-19: 1000 ug via INTRAMUSCULAR

## 2018-12-19 NOTE — Progress Notes (Signed)
Subjective:  Patient ID: Frank [name], male    DOB: 04-14-1941  Age: 78 y.o. MRN: [id number]  CC: Anemia; Hypertension; and Diabetes   HPI Frank [name] presents for f/up - Since I last saw him he has been seen by hematology for evaluation of thrombocytopenia.  When he saw hematology his platelet count was normal.  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  Outpatient Medications Prior to Visit  Medication Sig Dispense Refill  . amLODipine (NORVASC) 10 MG tablet Take 1 tablet (10 mg total) by mouth daily. 90 tablet 1  . atorvastatin (LIPITOR) 20 MG tablet Take 0.5 tablets (10 mg total) by mouth daily. 45 tablet 1  . azelastine (ASTELIN) 0.1 % nasal spray Place 1 spray into both nostrils 2 (two) times daily. Use in each nostril as directed 90 mL 1  . Carbonyl Iron 25 MG TABS Take 25 mg by mouth daily.    . Cholecalciferol 50 MCG (2000 UT) TABS Take 1 tablet (2,000 Units total) by mouth daily. 90 tablet 1  . doxazosin (CARDURA) 4 MG tablet Take 0.5 tablets (2 mg total) by mouth at bedtime. 45 tablet 1  . FLUoxetine (PROZAC) 20 MG capsule TAKE 1 CAPSULE BY MOUTH  DAILY 90 capsule 1  . fluticasone (FLONASE) 50 MCG/ACT nasal spray Place 1 spray into both nostrils daily as needed for allergies or rhinitis.    [name] glipiZIDE (GLIPIZIDE XL) 10 MG 24 hr tablet Take 1 tablet (10 mg total) by mouth daily. 90 tablet 1  . levocetirizine (XYZAL) 5 MG tablet Take 1 tablet (5 mg total) by mouth every evening. 90 tablet 1  . metFORMIN (GLUCOPHAGE) 1000 MG tablet Take 1 tablet (1,000 mg total) by mouth 2 (two) times daily with a meal. 180 tablet 1  . oxybutynin (DITROPAN) 5 MG tablet TAKE 1 TABLET BY MOUTH TWO  TIMES DAILY 180 tablet 1  . pantoprazole (PROTONIX) 40 MG tablet Take 1 tablet (40 mg total) by mouth daily. 90 tablet 1  . telmisartan (MICARDIS) 80 MG tablet TAKE 1 TABLET BY MOUTH  DAILY 90 tablet 1   No facility-administered medications prior  to visit.     ROS Review of Systems  Constitutional: Negative for diaphoresis and fatigue.  HENT: Negative.   Eyes: Negative for visual disturbance.  Respiratory: Negative for cough, chest tightness, shortness of breath and wheezing.   Cardiovascular: Negative for chest pain, palpitations and leg swelling.  Gastrointestinal: Negative for abdominal pain, blood in stool, constipation, diarrhea, nausea and vomiting.  Genitourinary: Negative.  Negative for difficulty urinating, dysuria and hematuria.  Musculoskeletal: Negative for arthralgias, back pain and neck pain.  Skin: Negative for color change and pallor.  Neurological: Negative.  Negative for dizziness, weakness and light-headedness.  Hematological: Negative for adenopathy. Does not bruise/bleed easily.  Psychiatric/Behavioral: Negative.     Objective:  BP 118/60 (BP Location: Left Arm, Patient Position: Sitting, Cuff Size: Normal)   Pulse 76   Temp 98.3 F (36.8 C) (Oral)   Resp 16   Ht 5\' 10"  (1.778 m)   Wt 208 lb (94.3 kg)   SpO2 97%   BMI 29.84 kg/m   BP Readings from Last 3 Encounters:  12/19/18 118/60  12/12/18 130/62  11/30/18 (!) 104/42    Wt Readings from Last 3 Encounters:  12/19/18 208 lb (94.3 kg)  12/12/18 210 lb 3.2 oz (95.3 kg)  11/30/18 213 lb (96.6 kg)    Physical Exam  Vitals signs reviewed.  Constitutional:      Appearance: He is not ill-appearing or diaphoretic.  HENT:     Nose: Nose normal. No congestion.     Mouth/Throat:     Mouth: Mucous membranes are moist.     Pharynx: No posterior oropharyngeal erythema.  Eyes:     General: No scleral icterus.    Conjunctiva/sclera: Conjunctivae normal.  Neck:     Musculoskeletal: Normal range of motion. No neck rigidity.  Cardiovascular:     Rate and Rhythm: Normal rate and regular rhythm.     Heart sounds: No murmur. No gallop.   Pulmonary:     Effort: Pulmonary effort is normal.     Breath sounds: No stridor. No wheezing, rhonchi or rales.   Abdominal:     General: Abdomen is flat.     Palpations: There is no hepatomegaly, splenomegaly or mass.     Tenderness: There is no abdominal tenderness.  Musculoskeletal: Normal range of motion.     Right lower leg: No edema.     Left lower leg: No edema.  Lymphadenopathy:     Cervical: No cervical adenopathy.  Skin:    Coloration: Skin is not jaundiced or pale.     Findings: No bruising.  Neurological:     General: No focal deficit present.     Mental Status: Mental status is at baseline.  Psychiatric:        Mood and Affect: Mood normal.     Lab Results  Component Value Date   WBC 5.8 12/12/2018   HGB 10.2 (L) 12/12/2018   HCT 32.1 (L) 12/12/2018   PLT 160 12/12/2018   GLUCOSE 143 (H) 11/30/2018   CHOL 146 05/25/2018   TRIG 158.0 (H) 05/25/2018   HDL 46.10 05/25/2018   LDLCALC 69 05/25/2018   ALT 19 08/04/2017   AST 20 08/04/2017   NA 134 (L) 11/30/2018   K 4.1 11/30/2018   CL 99 11/30/2018   CREATININE 1.82 (H) 11/30/2018   BUN 33 (H) 11/30/2018   CO2 24 11/30/2018   TSH 2.02 05/25/2018   PSA 1.08 07/10/2015   INR 0.98 08/04/2017   HGBA1C 7.9 (H) 11/21/2018   MICROALBUR 7.9 (H) 11/21/2018    Dg Chest 2 View  Result Date: 11/21/2018 CLINICAL DATA:  Cough EXAM: CHEST - 2 VIEW COMPARISON:  03/18/2016 FINDINGS: There is an old deformity of the fifth rib anterior laterally on the right. The cardiac silhouette is mildly enlarged. There is no pneumothorax. There is an old healed fracture of the right clavicle. The lungs are somewhat hyperexpanded with persistent blunting of the right costophrenic angle. Aortic calcifications are noted. IMPRESSION: 1. No acute cardiopulmonary process identified. 2. Chronic changes as above. Electronically Signed   By: Constance Holster M.D.   On: 11/21/2018 15:46    Assessment & Plan:   Holley was seen today for anemia, hypertension and diabetes.  Diagnoses and all orders for this visit:  Vitamin B12 deficiency anemia due to  intrinsic factor deficiency- He agrees to receive a monthly B12 injection. -     cyanocobalamin ((VITAMIN B-12)) injection 1,000 mcg  Essential hypertension- His blood pressure is adequately well controlled.  Type 2 diabetes mellitus with peripheral neuropathy ([place])- He is doing well on the current combination of an SU and metformin.  Will continue these meds.   I am having Frank Noa L. [name] "ken" maintain his Carbonyl Iron, fluticasone, oxybutynin, FLUoxetine, amLODipine, atorvastatin, doxazosin, pantoprazole, telmisartan, Cholecalciferol, glipiZIDE, metFORMIN, levocetirizine, and  azelastine. We administered cyanocobalamin.  Meds ordered this encounter  Medications  . cyanocobalamin ((VITAMIN B-12)) injection 1,000 mcg     Follow-up: Return in about 6 months (around 06/20/2019).  Scarlette Calico, MD

## 2018-12-19 NOTE — Patient Instructions (Signed)
Type 2 Diabetes Mellitus, Diagnosis, Adult Type 2 diabetes (type 2 diabetes mellitus) is a long-term (chronic) disease. In type 2 diabetes, one or both of these problems may be present:  The pancreas does not make enough of a hormone called insulin.  Cells in the body do not respond properly to insulin that the body makes (insulin resistance). Normally, insulin allows blood sugar (glucose) to enter cells in the body. The cells use glucose for energy. Insulin resistance or lack of insulin causes excess glucose to build up in the blood instead of going into cells. As a result, high blood glucose (hyperglycemia) develops. What increases the risk? The following factors may make you more likely to develop type 2 diabetes:  Having a family member with type 2 diabetes.  Being overweight or obese.  Having an inactive (sedentary) lifestyle.  Having been diagnosed with insulin resistance.  Having a history of prediabetes, gestational diabetes, or polycystic ovary syndrome (PCOS).  Being of American-Indian, African-American, Hispanic/Latino, or Asian/Pacific Islander descent. What are the signs or symptoms? In the early stage of this condition, you may not have symptoms. Symptoms develop slowly and may include:  Increased thirst (polydipsia).  Increased hunger(polyphagia).  Increased urination (polyuria).  Increased urination during the night (nocturia).  Unexplained weight loss.  Frequent infections that keep coming back (recurring).  Fatigue.  Weakness.  Vision changes, such as blurry vision.  Cuts or bruises that are slow to heal.  Tingling or numbness in the hands or feet.  Dark patches on the skin (acanthosis nigricans). How is this diagnosed? This condition is diagnosed based on your symptoms, your medical history, a physical exam, and your blood glucose level. Your blood glucose may be checked with one or more of the following blood tests:  A fasting blood glucose (FBG)  test. You will not be allowed to eat (you will fast) for 8 hours or longer before a blood sample is taken.  A random blood glucose test. This test checks blood glucose at any time of day regardless of when you ate.  An A1c (hemoglobin A1c) blood test. This test provides information about blood glucose control over the previous 2-3 months.  An oral glucose tolerance test (OGTT). This test measures your blood glucose at two times: ? After fasting. This is your baseline blood glucose level. ? Two hours after drinking a beverage that contains glucose. You may be diagnosed with type 2 diabetes if:  Your FBG level is 126 mg/dL (7.0 mmol/L) or higher.  Your random blood glucose level is 200 mg/dL (11.1 mmol/L) or higher.  Your A1c level is 6.5% or higher.  Your OGTT result is higher than 200 mg/dL (11.1 mmol/L). These blood tests may be repeated to confirm your diagnosis. How is this treated? Your treatment may be managed by a specialist called an endocrinologist. Type 2 diabetes may be treated by following instructions from your health care provider about:  Making diet and lifestyle changes. This may include: ? Following an individualized nutrition plan that is developed by a diet and nutrition specialist (registered dietitian). ? Exercising regularly. ? Finding ways to manage stress.  Checking your blood glucose level as often as told.  Taking diabetes medicines or insulin daily. This helps to keep your blood glucose levels in the healthy range. ? If you use insulin, you may need to adjust the dosage depending on how physically active you are and what foods you eat. Your health care provider will tell you how to adjust your dosage.    Taking medicines to help prevent complications from diabetes, such as: ? Aspirin. ? Medicine to lower cholesterol. ? Medicine to control blood pressure. Your health care provider will set individualized treatment goals for you. Your goals will be based on  your age, other medical conditions you have, and how you respond to diabetes treatment. Generally, the goal of treatment is to maintain the following blood glucose levels:  Before meals (preprandial): 80-130 mg/dL (4.4-7.2 mmol/L).  After meals (postprandial): below 180 mg/dL (10 mmol/L).  A1c level: less than 7%. Follow these instructions at home: Questions to ask your health care provider  Consider asking the following questions: ? Do I need to meet with a diabetes educator? ? Where can I find a support group for people with diabetes? ? What equipment will I need to manage my diabetes at home? ? What diabetes medicines do I need, and when should I take them? ? How often do I need to check my blood glucose? ? What number can I call if I have questions? ? When is my next appointment? General instructions  Take over-the-counter and prescription medicines only as told by your health care provider.  Keep all follow-up visits as told by your health care provider. This is important.  For more information about diabetes, visit: ? American Diabetes Association (ADA): www.diabetes.org ? American Association of Diabetes Educators (AADE): www.diabeteseducator.org Contact a health care provider if:  Your blood glucose is at or above 240 mg/dL (13.3 mmol/L) for 2 days in a row.  You have been sick or have had a fever for 2 days or longer, and you are not getting better.  You have any of the following problems for more than 6 hours: ? You cannot eat or drink. ? You have nausea and vomiting. ? You have diarrhea. Get help right away if:  Your blood glucose is lower than 54 mg/dL (3.0 mmol/L).  You become confused or you have trouble thinking clearly.  You have difficulty breathing.  You have moderate or large ketone levels in your urine. Summary  Type 2 diabetes (type 2 diabetes mellitus) is a long-term (chronic) disease. In type 2 diabetes, the pancreas does not make enough of a  hormone called insulin, or cells in the body do not respond properly to insulin that the body makes (insulin resistance).  This condition is treated by making diet and lifestyle changes and taking diabetes medicines or insulin.  Your health care provider will set individualized treatment goals for you. Your goals will be based on your age, other medical conditions you have, and how you respond to diabetes treatment.  Keep all follow-up visits as told by your health care provider. This is important. This information is not intended to replace advice given to you by your health care provider. Make sure you discuss any questions you have with your health care provider. Document Released: 06/29/2005 Document Revised: 01/28/2017 Document Reviewed: 08/02/2015 Elsevier Interactive Patient Education  2019 [employer]  

## 2019-01-02 ENCOUNTER — Other Ambulatory Visit: Payer: Self-pay | Admitting: Internal Medicine

## 2019-01-02 DIAGNOSIS — K219 Gastro-esophageal reflux disease without esophagitis: Secondary | ICD-10-CM

## 2019-01-18 ENCOUNTER — Ambulatory Visit (INDEPENDENT_AMBULATORY_CARE_PROVIDER_SITE_OTHER): Payer: Medicare Other

## 2019-01-18 ENCOUNTER — Other Ambulatory Visit: Payer: Self-pay

## 2019-01-18 DIAGNOSIS — E538 Deficiency of other specified B group vitamins: Secondary | ICD-10-CM

## 2019-01-18 MED ORDER — CYANOCOBALAMIN 1000 MCG/ML IJ SOLN
1000.0000 ug | Freq: Once | INTRAMUSCULAR | Status: AC
  Administered 2019-01-18: 1000 ug via INTRAMUSCULAR

## 2019-01-18 NOTE — Progress Notes (Signed)
I have reviewed and agree.

## 2019-01-23 ENCOUNTER — Other Ambulatory Visit: Payer: Self-pay | Admitting: Internal Medicine

## 2019-01-23 DIAGNOSIS — E1129 Type 2 diabetes mellitus with other diabetic kidney complication: Secondary | ICD-10-CM

## 2019-01-23 DIAGNOSIS — R809 Proteinuria, unspecified: Secondary | ICD-10-CM

## 2019-01-23 DIAGNOSIS — E1142 Type 2 diabetes mellitus with diabetic polyneuropathy: Secondary | ICD-10-CM

## 2019-02-06 DIAGNOSIS — G4733 Obstructive sleep apnea (adult) (pediatric): Secondary | ICD-10-CM | POA: Diagnosis not present

## 2019-02-20 ENCOUNTER — Ambulatory Visit (INDEPENDENT_AMBULATORY_CARE_PROVIDER_SITE_OTHER): Payer: Medicare Other

## 2019-02-20 DIAGNOSIS — E538 Deficiency of other specified B group vitamins: Secondary | ICD-10-CM

## 2019-02-20 MED ORDER — CYANOCOBALAMIN 1000 MCG/ML IJ SOLN
1000.0000 ug | Freq: Once | INTRAMUSCULAR | Status: AC
  Administered 2019-02-20: 1000 ug via INTRAMUSCULAR

## 2019-02-20 NOTE — Progress Notes (Signed)
I have reviewed and agree.

## 2019-02-21 ENCOUNTER — Other Ambulatory Visit: Payer: Self-pay | Admitting: Internal Medicine

## 2019-02-21 DIAGNOSIS — J301 Allergic rhinitis due to pollen: Secondary | ICD-10-CM

## 2019-03-07 ENCOUNTER — Encounter: Payer: Self-pay | Admitting: Internal Medicine

## 2019-03-10 DIAGNOSIS — G4733 Obstructive sleep apnea (adult) (pediatric): Secondary | ICD-10-CM | POA: Diagnosis not present

## 2019-03-13 ENCOUNTER — Other Ambulatory Visit: Payer: Self-pay | Admitting: Internal Medicine

## 2019-03-13 DIAGNOSIS — N401 Enlarged prostate with lower urinary tract symptoms: Secondary | ICD-10-CM

## 2019-03-13 DIAGNOSIS — E118 Type 2 diabetes mellitus with unspecified complications: Secondary | ICD-10-CM

## 2019-03-13 DIAGNOSIS — E785 Hyperlipidemia, unspecified: Secondary | ICD-10-CM

## 2019-03-13 DIAGNOSIS — I10 Essential (primary) hypertension: Secondary | ICD-10-CM

## 2019-03-13 DIAGNOSIS — E1142 Type 2 diabetes mellitus with diabetic polyneuropathy: Secondary | ICD-10-CM

## 2019-03-15 ENCOUNTER — Ambulatory Visit: Payer: Medicare Other

## 2019-03-23 ENCOUNTER — Ambulatory Visit (INDEPENDENT_AMBULATORY_CARE_PROVIDER_SITE_OTHER): Payer: Medicare Other

## 2019-03-23 DIAGNOSIS — E538 Deficiency of other specified B group vitamins: Secondary | ICD-10-CM

## 2019-03-23 MED ORDER — CYANOCOBALAMIN 1000 MCG/ML IJ SOLN
1000.0000 ug | Freq: Once | INTRAMUSCULAR | Status: AC
  Administered 2019-03-23: 1000 ug via INTRAMUSCULAR

## 2019-03-23 NOTE — Progress Notes (Signed)
I have reviewed and agree.

## 2019-03-28 DIAGNOSIS — L570 Actinic keratosis: Secondary | ICD-10-CM | POA: Diagnosis not present

## 2019-03-28 DIAGNOSIS — I8312 Varicose veins of left lower extremity with inflammation: Secondary | ICD-10-CM | POA: Diagnosis not present

## 2019-03-28 DIAGNOSIS — I8311 Varicose veins of right lower extremity with inflammation: Secondary | ICD-10-CM | POA: Diagnosis not present

## 2019-03-28 DIAGNOSIS — L821 Other seborrheic keratosis: Secondary | ICD-10-CM | POA: Diagnosis not present

## 2019-03-28 DIAGNOSIS — I872 Venous insufficiency (chronic) (peripheral): Secondary | ICD-10-CM | POA: Diagnosis not present

## 2019-03-28 DIAGNOSIS — Z85828 Personal history of other malignant neoplasm of skin: Secondary | ICD-10-CM | POA: Diagnosis not present

## 2019-04-20 ENCOUNTER — Other Ambulatory Visit (INDEPENDENT_AMBULATORY_CARE_PROVIDER_SITE_OTHER): Payer: Medicare Other

## 2019-04-20 ENCOUNTER — Other Ambulatory Visit: Payer: Self-pay

## 2019-04-20 ENCOUNTER — Encounter: Payer: Self-pay | Admitting: Internal Medicine

## 2019-04-20 ENCOUNTER — Ambulatory Visit (INDEPENDENT_AMBULATORY_CARE_PROVIDER_SITE_OTHER): Payer: Medicare Other | Admitting: Internal Medicine

## 2019-04-20 VITALS — BP 138/80 | HR 64 | Temp 98.5°F | Ht 70.0 in | Wt 216.0 lb

## 2019-04-20 DIAGNOSIS — E1142 Type 2 diabetes mellitus with diabetic polyneuropathy: Secondary | ICD-10-CM

## 2019-04-20 DIAGNOSIS — Z0000 Encounter for general adult medical examination without abnormal findings: Secondary | ICD-10-CM

## 2019-04-20 DIAGNOSIS — D539 Nutritional anemia, unspecified: Secondary | ICD-10-CM | POA: Insufficient documentation

## 2019-04-20 DIAGNOSIS — Z0001 Encounter for general adult medical examination with abnormal findings: Secondary | ICD-10-CM | POA: Diagnosis not present

## 2019-04-20 DIAGNOSIS — D696 Thrombocytopenia, unspecified: Secondary | ICD-10-CM

## 2019-04-20 DIAGNOSIS — L602 Onychogryphosis: Secondary | ICD-10-CM

## 2019-04-20 DIAGNOSIS — D510 Vitamin B12 deficiency anemia due to intrinsic factor deficiency: Secondary | ICD-10-CM | POA: Diagnosis not present

## 2019-04-20 DIAGNOSIS — E559 Vitamin D deficiency, unspecified: Secondary | ICD-10-CM | POA: Diagnosis not present

## 2019-04-20 DIAGNOSIS — E785 Hyperlipidemia, unspecified: Secondary | ICD-10-CM | POA: Diagnosis not present

## 2019-04-20 DIAGNOSIS — N1832 Chronic kidney disease, stage 3b: Secondary | ICD-10-CM | POA: Diagnosis not present

## 2019-04-20 DIAGNOSIS — I10 Essential (primary) hypertension: Secondary | ICD-10-CM

## 2019-04-20 DIAGNOSIS — J301 Allergic rhinitis due to pollen: Secondary | ICD-10-CM

## 2019-04-20 LAB — IBC PANEL
Iron: 65 ug/dL (ref 42–165)
Saturation Ratios: 21.2 % (ref 20.0–50.0)
Transferrin: 219 mg/dL (ref 212.0–360.0)

## 2019-04-20 LAB — BASIC METABOLIC PANEL
BUN: 25 mg/dL — ABNORMAL HIGH (ref 6–23)
CO2: 28 mEq/L (ref 19–32)
Calcium: 9.3 mg/dL (ref 8.4–10.5)
Chloride: 103 mEq/L (ref 96–112)
Creatinine, Ser: 1.32 mg/dL (ref 0.40–1.50)
GFR: 52.11 mL/min — ABNORMAL LOW (ref >60.00)
Glucose, Bld: 154 mg/dL — ABNORMAL HIGH (ref 70–99)
Potassium: 4.7 mEq/L (ref 3.5–5.1)
Sodium: 138 mEq/L (ref 135–145)

## 2019-04-20 LAB — CBC WITH DIFFERENTIAL/PLATELET
Basophils Absolute: 0 10*3/uL (ref 0.0–0.1)
Basophils Relative: 0.9 % (ref 0.0–3.0)
Eosinophils Absolute: 0.3 10*3/uL (ref 0.0–0.7)
Eosinophils Relative: 4.6 % (ref 0.0–5.0)
HCT: 33.8 % — ABNORMAL LOW (ref 39.0–52.0)
Hemoglobin: 11 g/dL — ABNORMAL LOW (ref 13.0–17.0)
Lymphocytes Relative: 22.2 % (ref 12.0–46.0)
Lymphs Abs: 1.2 10*3/uL (ref 0.7–4.0)
MCHC: 32.5 g/dL (ref 30.0–36.0)
MCV: 83.8 fl (ref 78.0–100.0)
Monocytes Absolute: 0.6 10*3/uL (ref 0.1–1.0)
Monocytes Relative: 11.6 % (ref 3.0–12.0)
Neutro Abs: 3.3 10*3/uL (ref 1.4–7.7)
Neutrophils Relative %: 60.7 % (ref 43.0–77.0)
Platelets: 164 10*3/uL (ref 150.0–400.0)
RBC: 4.03 Mil/uL — ABNORMAL LOW (ref 4.22–5.81)
RDW: 14.2 % (ref 11.5–15.5)
WBC: 5.4 10*3/uL (ref 4.0–10.5)

## 2019-04-20 LAB — LIPID PANEL
Cholesterol: 130 mg/dL (ref 0–200)
HDL: 46.1 mg/dL (ref >39.00)
LDL Cholesterol: 66 mg/dL (ref 0–99)
NonHDL: 83.44
Total CHOL/HDL Ratio: 3
Triglycerides: 89 mg/dL (ref 0.0–149.0)
VLDL: 17.8 mg/dL (ref 0.0–40.0)

## 2019-04-20 LAB — FERRITIN: Ferritin: 48.8 ng/mL (ref 22.0–322.0)

## 2019-04-20 LAB — VITAMIN D 25 HYDROXY (VIT D DEFICIENCY, FRACTURES): VITD: 37.8 ng/mL (ref 30.00–100.00)

## 2019-04-20 LAB — FOLATE: Folate: 12.7 ng/mL (ref >5.9)

## 2019-04-20 LAB — HEMOGLOBIN A1C: Hgb A1c MFr Bld: 7.7 % — ABNORMAL HIGH (ref 4.6–6.5)

## 2019-04-20 MED ORDER — CYANOCOBALAMIN 1000 MCG/ML IJ SOLN
1000.0000 ug | Freq: Once | INTRAMUSCULAR | Status: AC
  Administered 2019-04-20: 1000 ug via INTRAMUSCULAR

## 2019-04-20 MED ORDER — LEVOCETIRIZINE DIHYDROCHLORIDE 5 MG PO TABS: 5.0000 mg | ORAL_TABLET | Freq: Every evening | ORAL | 1 refills | Status: DC

## 2019-04-20 NOTE — Progress Notes (Signed)
Subjective:  Patient ID: Frank [name], male    DOB: 12-09-1940  Age: 78 y.o. MRN: [id number]  CC: Annual Exam, Hyperlipidemia, Diabetes, and Anemia   HPI Frank [name] presents for a CPX.  He complains of weight gain but otherwise feels well and offers no other new complaints.  He denies chest pain, palpitations, shortness of breath, edema, or fatigue.  Outpatient Medications Prior to Visit  Medication Sig Dispense Refill  . amLODipine (NORVASC) 10 MG tablet TAKE 1 TABLET BY MOUTH  DAILY 90 tablet 1  . atorvastatin (LIPITOR) 20 MG tablet TAKE ONE-HALF TABLET BY  MOUTH DAILY 45 tablet 1  . azelastine (ASTELIN) 0.1 % nasal spray Place 1 spray into both nostrils 2 (two) times daily. Use in each nostril as directed 90 mL 1  . Carbonyl Iron 25 MG TABS Take 25 mg by mouth daily.    . Cholecalciferol 50 MCG (2000 UT) TABS Take 1 tablet (2,000 Units total) by mouth daily. 90 tablet 1  . doxazosin (CARDURA) 4 MG tablet TAKE ONE-HALF TABLET BY  MOUTH AT BEDTIME 45 tablet 1  . FLUoxetine (PROZAC) 20 MG capsule TAKE 1 CAPSULE BY MOUTH  DAILY 90 capsule 1  . fluticasone (FLONASE) 50 MCG/ACT nasal spray Place 1 spray into both nostrils daily as needed for allergies or rhinitis.    [name] glipiZIDE (GLIPIZIDE XL) 10 MG 24 hr tablet Take 1 tablet (10 mg total) by mouth daily. 90 tablet 1  . metFORMIN (GLUCOPHAGE) 1000 MG tablet Take 1 tablet (1,000 mg total) by mouth 2 (two) times daily with a meal. 180 tablet 1  . oxybutynin (DITROPAN) 5 MG tablet TAKE 1 TABLET BY MOUTH TWO  TIMES DAILY 180 tablet 1  . pantoprazole (PROTONIX) 40 MG tablet TAKE 1 TABLET BY MOUTH  DAILY 90 tablet 1  . telmisartan (MICARDIS) 80 MG tablet TAKE 1 TABLET BY MOUTH  DAILY 90 tablet 1  . levocetirizine (XYZAL) 5 MG tablet TAKE 1 TABLET BY MOUTH ONCE DAILY IN THE EVENING 90 tablet 1   No facility-administered medications prior to visit.     ROS Review of Systems  Constitutional: Positive for unexpected  weight change (wt gain). Negative for diaphoresis and fatigue.  HENT: Positive for postnasal drip and rhinorrhea. Negative for facial swelling and nosebleeds.   Eyes: Negative for visual disturbance.  Respiratory: Negative for cough, chest tightness and shortness of breath.   Cardiovascular: Negative for chest pain, palpitations and leg swelling.  Gastrointestinal: Negative for abdominal pain, constipation, diarrhea, nausea and vomiting.  Endocrine: Negative.  Negative for polydipsia, polyphagia and polyuria.  Genitourinary: Negative.  Negative for difficulty urinating.  Musculoskeletal: Negative.  Negative for arthralgias and myalgias.  Skin: Negative.  Negative for color change and pallor.  Neurological: Negative.  Negative for dizziness, weakness and light-headedness.  Hematological: Negative for adenopathy. Does not bruise/bleed easily.  Psychiatric/Behavioral: Negative.     Objective:  BP 138/80 (BP Location: Left Arm, Patient Position: Sitting, Cuff Size: Normal)   Pulse 64   Temp 98.5 F (36.9 C) (Oral)   Ht 5\' 10"  (1.778 m)   Wt 216 lb (98 kg)   SpO2 95%   BMI 30.99 kg/m   BP Readings from Last 3 Encounters:  04/20/19 138/80  12/19/18 118/60  12/12/18 130/62    Wt Readings from Last 3 Encounters:  04/20/19 216 lb (98 kg)  12/19/18 208 lb (94.3 kg)  12/12/18 210 lb 3.2 oz (95.3 kg)  Physical Exam Vitals signs reviewed.  Constitutional:      Appearance: Normal appearance.  HENT:     Nose: Nose normal.     Mouth/Throat:     Mouth: Mucous membranes are moist.  Eyes:     General: No scleral icterus.    Conjunctiva/sclera: Conjunctivae normal.  Neck:     Musculoskeletal: Normal range of motion.  Cardiovascular:     Rate and Rhythm: Normal rate and regular rhythm.     Heart sounds: No murmur.  Pulmonary:     Effort: Pulmonary effort is normal.     Breath sounds: No stridor. No wheezing, rhonchi or rales.  Abdominal:     General: Abdomen is flat. Bowel  sounds are normal. There is no distension.     Palpations: Abdomen is soft. There is no hepatomegaly or splenomegaly.     Tenderness: There is no abdominal tenderness.  Genitourinary:    Comments: GU and rectal exams were deferred at his request. Musculoskeletal: Normal range of motion.     Right lower leg: No edema.     Left lower leg: No edema.  Lymphadenopathy:     Cervical: No cervical adenopathy.  Skin:    General: Skin is warm and dry.     Coloration: Skin is not pale.  Neurological:     Mental Status: He is alert and oriented to person, place, and time. Mental status is at baseline.  Psychiatric:        Mood and Affect: Mood normal.        Behavior: Behavior normal.     Lab Results  Component Value Date   WBC 5.4 04/20/2019   HGB 11.0 (L) 04/20/2019   HCT 33.8 (L) 04/20/2019   PLT 164.0 04/20/2019   GLUCOSE 154 (H) 04/20/2019   CHOL 130 04/20/2019   TRIG 89.0 04/20/2019   HDL 46.10 04/20/2019   LDLCALC 66 04/20/2019   ALT 19 08/04/2017   AST 20 08/04/2017   NA 138 04/20/2019   K 4.7 04/20/2019   CL 103 04/20/2019   CREATININE 1.32 04/20/2019   BUN 25 (H) 04/20/2019   CO2 28 04/20/2019   TSH 2.02 05/25/2018   PSA 1.08 07/10/2015   INR 0.98 08/04/2017   HGBA1C 7.7 (H) 04/20/2019   MICROALBUR 7.9 (H) 11/21/2018    Dg Chest 2 View  Result Date: 11/21/2018 CLINICAL DATA:  Cough EXAM: CHEST - 2 VIEW COMPARISON:  03/18/2016 FINDINGS: There is an old deformity of the fifth rib anterior laterally on the right. The cardiac silhouette is mildly enlarged. There is no pneumothorax. There is an old healed fracture of the right clavicle. The lungs are somewhat hyperexpanded with persistent blunting of the right costophrenic angle. Aortic calcifications are noted. IMPRESSION: 1. No acute cardiopulmonary process identified. 2. Chronic changes as above. Electronically Signed   By: Constance Holster M.D.   On: 11/21/2018 15:46    Assessment & Plan:   Frank [name] was seen today  for annual exam, hyperlipidemia, diabetes and anemia.  Diagnoses and all orders for this visit:  Essential hypertension- His blood pressure is adequately well controlled.  Electrolytes are normal.  Renal function is stable. -     Basic metabolic panel; Future  Type 2 diabetes mellitus with peripheral neuropathy ([place])- His A1c is at 7.7%.  His blood sugars are adequately well controlled. -     Hemoglobin A1c; Future -     HM Diabetes Foot Exam  Stage 3b chronic kidney disease -  Basic metabolic panel; Future  Hyperlipidemia with target LDL less than 100- He has achieved his LDL goal and is doing well on the statin. -     Lipid panel; Future  Thrombocytopenia ([place])- His platelet count is normal now. -     CBC with Differential/Platelet; Future  Vitamin B12 deficiency anemia due to intrinsic factor deficiency -     CBC with Differential/Platelet; Future -     Folate; Future -     cyanocobalamin ((VITAMIN B-12)) injection 1,000 mcg  Vitamin D deficiency -     VITAMIN D 25 Hydroxy (Vit-D Deficiency, Fractures); Future  Deficiency anemia- His H&H are slightly improved.  His vitamin levels are normal.  This is likely the anemia of chronic disease. -     IBC panel; Future -     Ferritin; Future -     Vitamin B1; Future  Long toenail -     Ambulatory referral to Podiatry  Seasonal allergic rhinitis due to pollen -     levocetirizine (XYZAL) 5 MG tablet; Take 1 tablet (5 mg total) by mouth every evening.   I have changed [name] [name] "ken"'s levocetirizine. I am also having him maintain his Carbonyl Iron, fluticasone, Cholecalciferol, glipiZIDE, metFORMIN, azelastine, pantoprazole, oxybutynin, FLUoxetine, doxazosin, telmisartan, atorvastatin, and amLODipine. We administered cyanocobalamin.  Meds ordered this encounter  Medications  . levocetirizine (XYZAL) 5 MG tablet    Sig: Take 1 tablet (5 mg total) by mouth every evening.    Dispense:  90 tablet    Refill:  1  .  cyanocobalamin ((VITAMIN B-12)) injection 1,000 mcg     Follow-up: Return in about 6 months (around 10/19/2019).  Scarlette Calico, MD

## 2019-04-20 NOTE — Patient Instructions (Signed)

## 2019-04-24 ENCOUNTER — Encounter: Payer: Self-pay | Admitting: Internal Medicine

## 2019-04-24 LAB — VITAMIN B1: Vitamin B1 (Thiamine): 12 nmol/L (ref 8–30)

## 2019-04-25 NOTE — Assessment & Plan Note (Signed)
Exam completed Labs reviewed Vaccines reviewed No cancer screenings are indicated Patient education was given. 

## 2019-05-01 ENCOUNTER — Ambulatory Visit: Payer: Medicare Other | Admitting: Podiatry

## 2019-05-01 ENCOUNTER — Other Ambulatory Visit: Payer: Self-pay

## 2019-05-01 ENCOUNTER — Encounter: Payer: Self-pay | Admitting: Podiatry

## 2019-05-01 DIAGNOSIS — E1149 Type 2 diabetes mellitus with other diabetic neurological complication: Secondary | ICD-10-CM

## 2019-05-01 DIAGNOSIS — M79674 Pain in right toe(s): Secondary | ICD-10-CM | POA: Diagnosis not present

## 2019-05-01 DIAGNOSIS — L84 Corns and callosities: Secondary | ICD-10-CM

## 2019-05-01 DIAGNOSIS — M79675 Pain in left toe(s): Secondary | ICD-10-CM

## 2019-05-01 DIAGNOSIS — B351 Tinea unguium: Secondary | ICD-10-CM

## 2019-05-01 DIAGNOSIS — E1140 Type 2 diabetes mellitus with diabetic neuropathy, unspecified: Secondary | ICD-10-CM | POA: Diagnosis not present

## 2019-05-01 NOTE — Progress Notes (Signed)
Subjective:   Patient ID: Frank [name], male   DOB: 78 y.o.   MRN: [id number]   HPI Patient presents with thickened nailbeds 1-5 both feet that are incurvated he cannot cut along with lesions second right with long-term diabetes that he has had.  Patient does not smoke likes to be active   Review of Systems  All other systems reviewed and are negative.       Objective:  Physical Exam Vitals signs and nursing note reviewed.  Constitutional:      Appearance: He is well-developed.  Pulmonary:     Effort: Pulmonary effort is normal.  Musculoskeletal: Normal range of motion.  Skin:    General: Skin is warm.  Neurological:     Mental Status: He is alert.     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  Discussed diabetic condition and gave advice on diabetic foot care

## 2019-05-17 ENCOUNTER — Encounter: Payer: Self-pay | Admitting: Internal Medicine

## 2019-05-17 ENCOUNTER — Other Ambulatory Visit: Payer: Self-pay | Admitting: Internal Medicine

## 2019-05-17 DIAGNOSIS — E1142 Type 2 diabetes mellitus with diabetic polyneuropathy: Secondary | ICD-10-CM

## 2019-05-17 DIAGNOSIS — K219 Gastro-esophageal reflux disease without esophagitis: Secondary | ICD-10-CM

## 2019-05-17 DIAGNOSIS — E119 Type 2 diabetes mellitus without complications: Secondary | ICD-10-CM

## 2019-05-22 ENCOUNTER — Other Ambulatory Visit: Payer: Self-pay

## 2019-05-22 ENCOUNTER — Ambulatory Visit (INDEPENDENT_AMBULATORY_CARE_PROVIDER_SITE_OTHER): Payer: Medicare Other

## 2019-05-22 DIAGNOSIS — E538 Deficiency of other specified B group vitamins: Secondary | ICD-10-CM | POA: Diagnosis not present

## 2019-05-22 MED ORDER — CYANOCOBALAMIN 1000 MCG/ML IJ SOLN
1000.0000 ug | Freq: Once | INTRAMUSCULAR | Status: AC
  Administered 2019-05-22: 1000 ug via INTRAMUSCULAR

## 2019-05-22 NOTE — Progress Notes (Signed)
I have reviewed and agree.

## 2019-05-29 ENCOUNTER — Encounter: Payer: Self-pay | Admitting: Internal Medicine

## 2019-05-29 ENCOUNTER — Ambulatory Visit: Payer: Self-pay

## 2019-05-29 ENCOUNTER — Other Ambulatory Visit: Payer: Self-pay | Admitting: Internal Medicine

## 2019-05-29 DIAGNOSIS — I10 Essential (primary) hypertension: Secondary | ICD-10-CM

## 2019-05-29 NOTE — Telephone Encounter (Signed)
Pt. Reports he has had elevated BP readings this past Friday and today. Today it is 160/70. Denies any symptoms. Taking all medications as prescribed.Wants to know if Dr. Ronnald Ramp wants to see him, or "just change my medications." Please advise pt.  Answer Assessment - Initial Assessment Questions 1. BLOOD PRESSURE: "What is the blood pressure?" "Did you take at least two measurements 5 minutes apart?"     160/70 2. ONSET: "When did you take your blood pressure?"     This morning 3. HOW: "How did you obtain the blood pressure?" (e.g., visiting nurse, automatic home BP monitor)     Home monitor 4. HISTORY: "Do you have a history of high blood pressure?"     Yes 5. MEDICATIONS: "Are you taking any medications for blood pressure?" "Have you missed any doses recently?"     No missed doses 6. OTHER SYMPTOMS: "Do you have any symptoms?" (e.g., headache, chest pain, blurred vision, difficulty breathing, weakness)     No - other than dizziness last week 7. PREGNANCY: "Is there any chance you are pregnant?" "When was your last menstrual period?"     n/a  Protocols used: HIGH BLOOD PRESSURE-A-AH

## 2019-05-29 NOTE — Telephone Encounter (Signed)
This is a minimal blood pressure elevation and does not need to be treated if he does not have any symptoms with this.

## 2019-05-29 NOTE — Telephone Encounter (Addendum)
Pt informed of below. He states he is asymptomatic at this time. He will call us to schedule if needed or go to ER if he develops any new sxs.

## 2019-05-30 ENCOUNTER — Ambulatory Visit: Payer: Medicare Other | Admitting: Internal Medicine

## 2019-06-05 ENCOUNTER — Other Ambulatory Visit: Payer: Self-pay | Admitting: Internal Medicine

## 2019-06-05 DIAGNOSIS — G4733 Obstructive sleep apnea (adult) (pediatric): Secondary | ICD-10-CM | POA: Diagnosis not present

## 2019-06-05 DIAGNOSIS — E559 Vitamin D deficiency, unspecified: Secondary | ICD-10-CM

## 2019-06-19 ENCOUNTER — Ambulatory Visit: Payer: Medicare Other

## 2019-06-22 ENCOUNTER — Other Ambulatory Visit: Payer: Self-pay

## 2019-06-22 ENCOUNTER — Ambulatory Visit (INDEPENDENT_AMBULATORY_CARE_PROVIDER_SITE_OTHER): Payer: Medicare Other

## 2019-06-22 DIAGNOSIS — D510 Vitamin B12 deficiency anemia due to intrinsic factor deficiency: Secondary | ICD-10-CM | POA: Diagnosis not present

## 2019-06-22 MED ORDER — CYANOCOBALAMIN 1000 MCG/ML IJ SOLN
1000.0000 ug | Freq: Once | INTRAMUSCULAR | Status: AC
  Administered 2019-06-22: 1000 ug via INTRAMUSCULAR

## 2019-06-22 NOTE — Progress Notes (Signed)
I have reviewed and agree.

## 2019-06-26 ENCOUNTER — Encounter: Payer: Self-pay | Admitting: Internal Medicine

## 2019-06-30 DIAGNOSIS — G4733 Obstructive sleep apnea (adult) (pediatric): Secondary | ICD-10-CM | POA: Diagnosis not present

## 2019-07-16 ENCOUNTER — Encounter: Payer: Self-pay | Admitting: Internal Medicine

## 2019-07-18 ENCOUNTER — Ambulatory Visit: Payer: Medicare Other | Attending: Internal Medicine

## 2019-07-18 DIAGNOSIS — Z20822 Contact with and (suspected) exposure to covid-19: Secondary | ICD-10-CM | POA: Diagnosis not present

## 2019-07-19 ENCOUNTER — Other Ambulatory Visit: Payer: Self-pay | Admitting: Internal Medicine

## 2019-07-19 DIAGNOSIS — E118 Type 2 diabetes mellitus with unspecified complications: Secondary | ICD-10-CM

## 2019-07-19 DIAGNOSIS — I10 Essential (primary) hypertension: Secondary | ICD-10-CM

## 2019-07-19 DIAGNOSIS — E1142 Type 2 diabetes mellitus with diabetic polyneuropathy: Secondary | ICD-10-CM

## 2019-07-19 DIAGNOSIS — N401 Enlarged prostate with lower urinary tract symptoms: Secondary | ICD-10-CM

## 2019-07-19 DIAGNOSIS — E785 Hyperlipidemia, unspecified: Secondary | ICD-10-CM

## 2019-07-20 LAB — NOVEL CORONAVIRUS, NAA: SARS-CoV-2, NAA: NOT DETECTED

## 2019-07-24 ENCOUNTER — Ambulatory Visit (INDEPENDENT_AMBULATORY_CARE_PROVIDER_SITE_OTHER): Payer: Medicare Other

## 2019-07-24 ENCOUNTER — Other Ambulatory Visit: Payer: Self-pay

## 2019-07-24 DIAGNOSIS — E538 Deficiency of other specified B group vitamins: Secondary | ICD-10-CM

## 2019-07-24 MED ORDER — CYANOCOBALAMIN 1000 MCG/ML IJ SOLN
1000.0000 ug | Freq: Once | INTRAMUSCULAR | Status: AC
  Administered 2019-07-24: 1000 ug via INTRAMUSCULAR

## 2019-07-24 NOTE — Progress Notes (Signed)
I have reviewed and agree.

## 2019-07-26 ENCOUNTER — Ambulatory Visit: Payer: Medicare Other

## 2019-07-27 ENCOUNTER — Ambulatory Visit: Payer: Medicare Other

## 2019-07-29 ENCOUNTER — Ambulatory Visit: Payer: Medicare Other | Attending: Internal Medicine

## 2019-07-29 DIAGNOSIS — Z23 Encounter for immunization: Secondary | ICD-10-CM | POA: Insufficient documentation

## 2019-07-29 NOTE — Progress Notes (Signed)
   Covid-19 Vaccination Clinic  Name:  Frank [name]    MRN: [id number] DOB: 1940/10/28  07/29/2019  [name] was observed post Covid-19 immunization for 15 minutes without incidence. He was provided with Vaccine Information Sheet and instruction to access the V-Safe system.   [name] was instructed to call 911 with any severe reactions post vaccine: [name] Difficulty breathing  . Swelling of your face and throat  . A fast heartbeat  . A bad rash all over your body  . Dizziness and weakness    Immunizations Administered    Name Date Dose VIS Date Route   Pfizer COVID-19 Vaccine 07/29/2019 11:55 AM 0.3 mL 06/23/2019 Intramuscular   Manufacturer: [place]   Lot: [id number]   [place]: [id number]

## 2019-08-19 ENCOUNTER — Ambulatory Visit: Payer: Medicare Other | Attending: Internal Medicine

## 2019-08-19 DIAGNOSIS — Z23 Encounter for immunization: Secondary | ICD-10-CM

## 2019-08-19 NOTE — Progress Notes (Signed)
   Covid-19 Vaccination Clinic  Name:  Frank [name]    MRN: [id number] DOB: Nov 27, 1940  08/19/2019  [name] was observed post Covid-19 immunization for 30 minutes based on pre-vaccination screening without incidence. He was provided with Vaccine Information Sheet and instruction to access the V-Safe system.   [name] was instructed to call 911 with any severe reactions post vaccine: [name] Difficulty breathing  . Swelling of your face and throat  . A fast heartbeat  . A bad rash all over your body  . Dizziness and weakness    Immunizations Administered    Name Date Dose VIS Date Route   Pfizer COVID-19 Vaccine 08/19/2019 11:48 AM 0.3 mL 06/23/2019 Intramuscular   Manufacturer: [place]   Lot: [id number]   [place]: [id number]

## 2019-08-21 ENCOUNTER — Ambulatory Visit: Payer: Medicare Other

## 2019-08-21 ENCOUNTER — Other Ambulatory Visit: Payer: Self-pay

## 2019-08-21 ENCOUNTER — Other Ambulatory Visit: Payer: Medicare Other

## 2019-08-21 ENCOUNTER — Ambulatory Visit (INDEPENDENT_AMBULATORY_CARE_PROVIDER_SITE_OTHER): Payer: Medicare Other | Admitting: Internal Medicine

## 2019-08-21 ENCOUNTER — Encounter: Payer: Self-pay | Admitting: Internal Medicine

## 2019-08-21 VITALS — BP 128/68 | HR 75 | Temp 98.1°F | Ht 70.0 in | Wt 212.5 lb

## 2019-08-21 DIAGNOSIS — N1832 Chronic kidney disease, stage 3b: Secondary | ICD-10-CM

## 2019-08-21 DIAGNOSIS — E559 Vitamin D deficiency, unspecified: Secondary | ICD-10-CM

## 2019-08-21 DIAGNOSIS — D510 Vitamin B12 deficiency anemia due to intrinsic factor deficiency: Secondary | ICD-10-CM

## 2019-08-21 DIAGNOSIS — E1142 Type 2 diabetes mellitus with diabetic polyneuropathy: Secondary | ICD-10-CM | POA: Diagnosis not present

## 2019-08-21 DIAGNOSIS — I10 Essential (primary) hypertension: Secondary | ICD-10-CM | POA: Diagnosis not present

## 2019-08-21 LAB — BASIC METABOLIC PANEL
BUN: 43 mg/dL — ABNORMAL HIGH (ref 6–23)
CO2: 27 mEq/L (ref 19–32)
Calcium: 8.9 mg/dL (ref 8.4–10.5)
Chloride: 103 mEq/L (ref 96–112)
Creatinine, Ser: 1.66 mg/dL — ABNORMAL HIGH (ref 0.40–1.50)
GFR: 39.97 mL/min — ABNORMAL LOW (ref >60.00)
Glucose, Bld: 163 mg/dL — ABNORMAL HIGH (ref 70–99)
Potassium: 5 mEq/L (ref 3.5–5.1)
Sodium: 138 mEq/L (ref 135–145)

## 2019-08-21 LAB — CBC WITH DIFFERENTIAL/PLATELET
Basophils Absolute: 0 10*3/uL (ref 0.0–0.1)
Basophils Relative: 0.8 % (ref 0.0–3.0)
Eosinophils Absolute: 0.4 10*3/uL (ref 0.0–0.7)
Eosinophils Relative: 5.4 % — ABNORMAL HIGH (ref 0.0–5.0)
HCT: 31.9 % — ABNORMAL LOW (ref 39.0–52.0)
Hemoglobin: 10.4 g/dL — ABNORMAL LOW (ref 13.0–17.0)
Lymphocytes Relative: 19.4 % (ref 12.0–46.0)
Lymphs Abs: 1.3 10*3/uL (ref 0.7–4.0)
MCHC: 32.6 g/dL (ref 30.0–36.0)
MCV: 84.5 fl (ref 78.0–100.0)
Monocytes Absolute: 0.7 10*3/uL (ref 0.1–1.0)
Monocytes Relative: 11.3 % (ref 3.0–12.0)
Neutro Abs: 4.1 10*3/uL (ref 1.4–7.7)
Neutrophils Relative %: 63.1 % (ref 43.0–77.0)
Platelets: 144 10*3/uL — ABNORMAL LOW (ref 150.0–400.0)
RBC: 3.77 Mil/uL — ABNORMAL LOW (ref 4.22–5.81)
RDW: 13.9 % (ref 11.5–15.5)
WBC: 6.5 10*3/uL (ref 4.0–10.5)

## 2019-08-21 LAB — VITAMIN D 25 HYDROXY (VIT D DEFICIENCY, FRACTURES): VITD: 35.6 ng/mL (ref 30.00–100.00)

## 2019-08-21 LAB — HEMOGLOBIN A1C: Hgb A1c MFr Bld: 7 % — ABNORMAL HIGH (ref 4.6–6.5)

## 2019-08-21 MED ORDER — CYANOCOBALAMIN 1000 MCG/ML IJ SOLN
1000.0000 ug | Freq: Once | INTRAMUSCULAR | Status: AC
  Administered 2019-08-21: 1000 ug via INTRAMUSCULAR

## 2019-08-21 NOTE — Patient Instructions (Signed)
Anemia  Anemia is a condition in which you do not have enough red blood cells or hemoglobin. Hemoglobin is a substance in red blood cells that carries oxygen. When you do not have enough red blood cells or hemoglobin (are anemic), your body cannot get enough oxygen and your organs may not work properly. As a result, you may feel very tired or have other problems. What are the causes? Common causes of anemia include:  Excessive bleeding. Anemia can be caused by excessive bleeding inside or outside the body, including bleeding from the intestine or from periods in women.  Poor nutrition.  Long-lasting (chronic) kidney, thyroid, and liver disease.  Bone marrow disorders.  Cancer and treatments for cancer.  HIV (human immunodeficiency virus) and AIDS (acquired immunodeficiency syndrome).  Treatments for HIV and AIDS.  Spleen problems.  Blood disorders.  Infections, medicines, and autoimmune disorders that destroy red blood cells. What are the signs or symptoms? Symptoms of this condition include:  Minor weakness.  Dizziness.  Headache.  Feeling heartbeats that are irregular or faster than normal (palpitations).  Shortness of breath, especially with exercise.  Paleness.  Cold sensitivity.  Indigestion.  Nausea.  Difficulty sleeping.  Difficulty concentrating. Symptoms may occur suddenly or develop slowly. If your anemia is mild, you may not have symptoms. How is this diagnosed? This condition is diagnosed based on:  Blood tests.  Your medical history.  A physical exam.  Bone marrow biopsy. Your health care provider may also check your stool (feces) for blood and may do additional testing to look for the cause of your bleeding. You may also have other tests, including:  Imaging tests, such as a CT scan or MRI.  Endoscopy.  Colonoscopy. How is this treated? Treatment for this condition depends on the cause. If you continue to lose a lot of blood, you may  need to be treated at a hospital. Treatment may include:  Taking supplements of iron, vitamin S31, or folic acid.  Taking a hormone medicine (erythropoietin) that can help to stimulate red blood cell growth.  Having a blood transfusion. This may be needed if you lose a lot of blood.  Making changes to your diet.  Having surgery to remove your spleen. Follow these instructions at home:  Take over-the-counter and prescription medicines only as told by your health care provider.  Take supplements only as told by your health care provider.  Follow any diet instructions that you were given.  Keep all follow-up visits as told by your health care provider. This is important. Contact a health care provider if:  You develop new bleeding anywhere in the body. Get help right away if:  You are very weak.  You are short of breath.  You have pain in your abdomen or chest.  You are dizzy or feel faint.  You have trouble concentrating.  You have bloody or black, tarry stools.  You vomit repeatedly or you vomit up blood. Summary  Anemia is a condition in which you do not have enough red blood cells or enough of a substance in your red blood cells that carries oxygen (hemoglobin).  Symptoms may occur suddenly or develop slowly.  If your anemia is mild, you may not have symptoms.  This condition is diagnosed with blood tests as well as a medical history and physical exam. Other tests may be needed.  Treatment for this condition depends on the cause of the anemia. This information is not intended to replace advice given to you by  your health care provider. Make sure you discuss any questions you have with your health care provider. Document Revised: 06/11/2017 Document Reviewed: 07/31/2016 Elsevier Patient Education  [place].

## 2019-08-21 NOTE — Progress Notes (Signed)
Subjective:  Patient ID: Frank [name], male    DOB: February 14, 1941  Age: 79 y.o. MRN: [id number]  CC: Diabetes and Anemia  This visit occurred during the SARS-CoV-2 public health emergency.  Safety protocols were in place, including screening questions prior to the visit, additional usage of staff PPE, and extensive cleaning of exam room while observing appropriate contact time as indicated for disinfecting solutions.    HPI Frank [name] presents for f/up - Frank [name] tells me Frank [name] is in his usual state of health with no recent complications or development of new symptoms.  Frank [name] tells me his blood pressure and blood sugar have been well controlled.  Outpatient Medications Prior to Visit  Medication Sig Dispense Refill  . amLODipine (NORVASC) 10 MG tablet TAKE 1 TABLET BY MOUTH  DAILY 90 tablet 1  . atorvastatin (LIPITOR) 20 MG tablet TAKE ONE-HALF TABLET BY  MOUTH DAILY 45 tablet 1  . azelastine (ASTELIN) 0.1 % nasal spray Place 1 spray into both nostrils 2 (two) times daily. Use in each nostril as directed 90 mL 1  . Carbonyl Iron 25 MG TABS Take 25 mg by mouth daily.    . Cholecalciferol (VITAMIN D3) 50 MCG (2000 UT) TABS Take 1 tablet by mouth once daily 90 tablet 0  . doxazosin (CARDURA) 4 MG tablet TAKE ONE-HALF TABLET BY  MOUTH AT BEDTIME 45 tablet 1  . FLUoxetine (PROZAC) 20 MG capsule TAKE 1 CAPSULE BY MOUTH  DAILY 90 capsule 1  . fluticasone (FLONASE) 50 MCG/ACT nasal spray Place 1 spray into both nostrils daily as needed for allergies or rhinitis.    [name] FLUZONE HIGH-DOSE QUADRIVALENT 0.7 ML SUSY TO BE ADMINISTERED BY PHARMACIST FOR IMMUNIZATION    . glipiZIDE (GLUCOTROL XL) 10 MG 24 hr tablet TAKE 1 TABLET BY MOUTH  DAILY 90 tablet 1  . levocetirizine (XYZAL) 5 MG tablet Take 1 tablet (5 mg total) by mouth every evening. 90 tablet 1  . metFORMIN (GLUCOPHAGE) 1000 MG tablet TAKE 1 TABLET BY MOUTH  TWICE DAILY WITH A MEAL 180 tablet 3  . oxybutynin (DITROPAN) 5 MG tablet TAKE 1  TABLET BY MOUTH  TWICE DAILY 180 tablet 1  . pantoprazole (PROTONIX) 40 MG tablet TAKE 1 TABLET BY MOUTH  DAILY 90 tablet 3  . telmisartan (MICARDIS) 80 MG tablet TAKE 1 TABLET BY MOUTH  DAILY 90 tablet 1  . triamcinolone cream (KENALOG) 0.1 %      No facility-administered medications prior to visit.    ROS Review of Systems  Constitutional: Negative for appetite change, diaphoresis, fatigue and unexpected weight change.  HENT: Negative.   Eyes: Negative.   Respiratory: Negative for cough, chest tightness, shortness of breath and wheezing.   Cardiovascular: Negative for chest pain, palpitations and leg swelling.  Gastrointestinal: Negative for abdominal pain, constipation, diarrhea, nausea and vomiting.  Endocrine: Negative.   Genitourinary: Negative.  Negative for difficulty urinating and dysuria.  Musculoskeletal: Negative.  Negative for arthralgias and myalgias.  Skin: Negative.   Neurological: Negative.  Negative for dizziness, weakness, light-headedness and headaches.  Hematological: Negative for adenopathy. Does not bruise/bleed easily.  Psychiatric/Behavioral: Negative for decreased concentration.    Objective:  BP 128/68 (BP Location: Left Arm, Patient Position: Sitting, Cuff Size: Normal)   Pulse 75   Temp 98.1 F (36.7 C) (Oral)   Ht 5\' 10"  (1.778 m)   Wt 212 lb 8 oz (96.4 kg)   SpO2 96%   BMI 30.49 kg/m   BP Readings  from Last 3 Encounters:  08/21/19 128/68  04/20/19 138/80  12/19/18 118/60    Wt Readings from Last 3 Encounters:  08/21/19 212 lb 8 oz (96.4 kg)  04/20/19 216 lb (98 kg)  12/19/18 208 lb (94.3 kg)    Physical Exam Vitals reviewed.  Constitutional:      Appearance: Normal appearance.  HENT:     Nose: Nose normal.     Mouth/Throat:     Mouth: Mucous membranes are moist.  Eyes:     General: No scleral icterus.    Conjunctiva/sclera: Conjunctivae normal.  Cardiovascular:     Rate and Rhythm: Normal rate and regular rhythm.     Heart  sounds: No murmur.  Pulmonary:     Effort: Pulmonary effort is normal.     Breath sounds: No stridor. No wheezing, rhonchi or rales.  Abdominal:     General: Abdomen is flat. Bowel sounds are normal. There is no distension.     Palpations: Abdomen is soft. There is no hepatomegaly, splenomegaly or mass.     Tenderness: There is no abdominal tenderness.  Musculoskeletal:        General: Normal range of motion.     Cervical back: Neck supple.     Right lower leg: Edema (trace) present.     Left lower leg: Edema (trace) present.  Lymphadenopathy:     Cervical: No cervical adenopathy.  Skin:    General: Skin is warm.  Neurological:     General: No focal deficit present.     Mental Status: Frank [name] is alert and oriented to person, place, and time. Mental status is at baseline.     Lab Results  Component Value Date   WBC 6.5 08/21/2019   HGB 10.4 (L) 08/21/2019   HCT 31.9 (L) 08/21/2019   PLT 144.0 (L) 08/21/2019   GLUCOSE 163 (H) 08/21/2019   CHOL 130 04/20/2019   TRIG 89.0 04/20/2019   HDL 46.10 04/20/2019   LDLCALC 66 04/20/2019   ALT 19 08/04/2017   AST 20 08/04/2017   NA 138 08/21/2019   K 5.0 08/21/2019   CL 103 08/21/2019   CREATININE 1.66 (H) 08/21/2019   BUN 43 (H) 08/21/2019   CO2 27 08/21/2019   TSH 2.02 05/25/2018   PSA 1.08 07/10/2015   INR 0.98 08/04/2017   HGBA1C 7.0 (H) 08/21/2019   MICROALBUR 7.9 (H) 11/21/2018    DG Chest 2 View  Result Date: 11/21/2018 CLINICAL DATA:  Cough EXAM: CHEST - 2 VIEW COMPARISON:  03/18/2016 FINDINGS: There is an old deformity of the fifth rib anterior laterally on the right. The cardiac silhouette is mildly enlarged. There is no pneumothorax. There is an old healed fracture of the right clavicle. The lungs are somewhat hyperexpanded with persistent blunting of the right costophrenic angle. Aortic calcifications are noted. IMPRESSION: 1. No acute cardiopulmonary process identified. 2. Chronic changes as above. Electronically Signed    By: Constance Holster M.D.   On: 11/21/2018 15:46    Assessment & Plan:   Frank [name] was seen today for diabetes and anemia.  Diagnoses and all orders for this visit:  Essential hypertension- His blood pressure is well controlled.  Electrolytes are normal.  Renal function is stable. -     Basic metabolic panel; Future  Type 2 diabetes mellitus with peripheral neuropathy ([place])- His A1c is at 7.0%.  Frank [name] has achieved adequate glycemic control. -     HM Diabetes Foot Exam -     Hemoglobin A1c; Future  Stage 3b chronic kidney disease- His renal function is stable.  Frank [name] will avoid nephrotoxic agents.  Will continue to maintain control of his blood pressure and his blood sugar. -     Basic metabolic panel; Future  Vitamin B12 deficiency anemia due to intrinsic factor deficiency -     CBC with Differential/Platelet; Future -     cyanocobalamin ((VITAMIN B-12)) injection 1,000 mcg  Vitamin D deficiency- His vitamin D level is normal now. -     VITAMIN D 25 Hydroxy (Vit-D Deficiency, Fractures); Future   I am having Frank Noa L. [name] "ken" maintain his Carbonyl Iron, fluticasone, azelastine, levocetirizine, FLUoxetine, metFORMIN, pantoprazole, glipiZIDE, oxybutynin, Vitamin D3, telmisartan, atorvastatin, amLODipine, doxazosin, Fluzone High-Dose Quadrivalent, and triamcinolone cream. We administered cyanocobalamin.  Meds ordered this encounter  Medications  . cyanocobalamin ((VITAMIN B-12)) injection 1,000 mcg     Follow-up: Return in about 6 months (around 02/18/2020).  Scarlette Calico, MD

## 2019-08-22 ENCOUNTER — Encounter: Payer: Self-pay | Admitting: Internal Medicine

## 2019-09-05 ENCOUNTER — Other Ambulatory Visit: Payer: Self-pay | Admitting: Internal Medicine

## 2019-09-05 DIAGNOSIS — E559 Vitamin D deficiency, unspecified: Secondary | ICD-10-CM

## 2019-09-20 ENCOUNTER — Ambulatory Visit (INDEPENDENT_AMBULATORY_CARE_PROVIDER_SITE_OTHER): Payer: Medicare Other | Admitting: *Deleted

## 2019-09-20 ENCOUNTER — Other Ambulatory Visit: Payer: Self-pay

## 2019-09-20 DIAGNOSIS — E538 Deficiency of other specified B group vitamins: Secondary | ICD-10-CM | POA: Diagnosis not present

## 2019-09-20 MED ORDER — CYANOCOBALAMIN 1000 MCG/ML IJ SOLN
1000.0000 ug | Freq: Once | INTRAMUSCULAR | Status: AC
  Administered 2019-09-20: 1000 ug via INTRAMUSCULAR

## 2019-09-20 NOTE — Progress Notes (Addendum)
I have reviewed and agree.

## 2019-09-21 DIAGNOSIS — H5203 Hypermetropia, bilateral: Secondary | ICD-10-CM | POA: Diagnosis not present

## 2019-09-21 DIAGNOSIS — E119 Type 2 diabetes mellitus without complications: Secondary | ICD-10-CM | POA: Diagnosis not present

## 2019-09-21 LAB — HM DIABETES EYE EXAM

## 2019-09-27 DIAGNOSIS — Z85828 Personal history of other malignant neoplasm of skin: Secondary | ICD-10-CM | POA: Diagnosis not present

## 2019-09-27 DIAGNOSIS — L570 Actinic keratosis: Secondary | ICD-10-CM | POA: Diagnosis not present

## 2019-09-27 DIAGNOSIS — D485 Neoplasm of uncertain behavior of skin: Secondary | ICD-10-CM | POA: Diagnosis not present

## 2019-09-27 DIAGNOSIS — L821 Other seborrheic keratosis: Secondary | ICD-10-CM | POA: Diagnosis not present

## 2019-09-27 DIAGNOSIS — L820 Inflamed seborrheic keratosis: Secondary | ICD-10-CM | POA: Diagnosis not present

## 2019-09-27 DIAGNOSIS — D225 Melanocytic nevi of trunk: Secondary | ICD-10-CM | POA: Diagnosis not present

## 2019-09-27 DIAGNOSIS — D1801 Hemangioma of skin and subcutaneous tissue: Secondary | ICD-10-CM | POA: Diagnosis not present

## 2019-09-28 DIAGNOSIS — G4733 Obstructive sleep apnea (adult) (pediatric): Secondary | ICD-10-CM | POA: Diagnosis not present

## 2019-10-10 LAB — HM DIABETES EYE EXAM

## 2019-10-23 ENCOUNTER — Ambulatory Visit (INDEPENDENT_AMBULATORY_CARE_PROVIDER_SITE_OTHER): Payer: Medicare Other | Admitting: *Deleted

## 2019-10-23 ENCOUNTER — Other Ambulatory Visit: Payer: Self-pay

## 2019-10-23 DIAGNOSIS — E538 Deficiency of other specified B group vitamins: Secondary | ICD-10-CM | POA: Diagnosis not present

## 2019-10-23 MED ORDER — CYANOCOBALAMIN 1000 MCG/ML IJ SOLN
1000.0000 ug | Freq: Once | INTRAMUSCULAR | Status: AC
  Administered 2019-10-23: 1000 ug via INTRAMUSCULAR

## 2019-10-23 NOTE — Progress Notes (Addendum)
Pls cosign for B12 inj../lmb I have reviewed and agree  

## 2019-10-26 ENCOUNTER — Other Ambulatory Visit: Payer: Self-pay | Admitting: Internal Medicine

## 2019-10-26 DIAGNOSIS — E1142 Type 2 diabetes mellitus with diabetic polyneuropathy: Secondary | ICD-10-CM

## 2019-10-26 DIAGNOSIS — E119 Type 2 diabetes mellitus without complications: Secondary | ICD-10-CM

## 2019-10-30 ENCOUNTER — Ambulatory Visit: Payer: Medicare Other | Admitting: Podiatry

## 2019-11-01 ENCOUNTER — Encounter: Payer: Self-pay | Admitting: Podiatry

## 2019-11-01 ENCOUNTER — Ambulatory Visit: Payer: Medicare Other | Admitting: Podiatry

## 2019-11-01 ENCOUNTER — Other Ambulatory Visit: Payer: Self-pay

## 2019-11-01 VITALS — Temp 98.2°F

## 2019-11-01 DIAGNOSIS — L84 Corns and callosities: Secondary | ICD-10-CM | POA: Diagnosis not present

## 2019-11-01 DIAGNOSIS — E1149 Type 2 diabetes mellitus with other diabetic neurological complication: Secondary | ICD-10-CM | POA: Diagnosis not present

## 2019-11-01 DIAGNOSIS — M79675 Pain in left toe(s): Secondary | ICD-10-CM | POA: Diagnosis not present

## 2019-11-01 DIAGNOSIS — M79674 Pain in right toe(s): Secondary | ICD-10-CM | POA: Diagnosis not present

## 2019-11-01 DIAGNOSIS — E1140 Type 2 diabetes mellitus with diabetic neuropathy, unspecified: Secondary | ICD-10-CM

## 2019-11-01 DIAGNOSIS — B351 Tinea unguium: Secondary | ICD-10-CM

## 2019-11-01 NOTE — Progress Notes (Signed)
Subjective:   Patient ID: Frank [name], male   DOB: 79 y.o.   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

## 2019-11-08 ENCOUNTER — Ambulatory Visit (INDEPENDENT_AMBULATORY_CARE_PROVIDER_SITE_OTHER): Payer: Medicare Other

## 2019-11-08 ENCOUNTER — Other Ambulatory Visit: Payer: Self-pay

## 2019-11-08 VITALS — BP 120/78 | HR 70 | Temp 97.8°F | Resp 16 | Ht 70.0 in | Wt 213.2 lb

## 2019-11-08 DIAGNOSIS — Z0000 Encounter for general adult medical examination without abnormal findings: Secondary | ICD-10-CM

## 2019-11-08 NOTE — Progress Notes (Addendum)
Subjective:   Frank [name] is a 79 y.o. male who presents for Medicare Annual/Subsequent preventive examination.  Review of Systems:  No ROS. Medicare Wellness Visit. Cardiac Risk Factors include: advanced age (>52men, >22 women);diabetes mellitus;dyslipidemia;male gender;obesity (BMI >30kg/m2)  Sleep Patterns: No issues with falling asleep; gets up 1-2 times to void and sleeps 8 hours nightly. Home Safety/Smoke Alarms: Feels safe in home; Smoke alarms in place. Living environment: 1-story home; lives with wife; no need for DME and has a good support system. Seat Belt Safety/Bike Helmet: Wears seat belt.      Objective:    Vitals: BP 120/78 (BP Location: Left Arm, Patient Position: Sitting, Cuff Size: Normal)   Pulse 70   Temp 97.8 F (36.6 C)   Resp 16   Ht 5\' 10"  (1.778 m)   Wt 213 lb 3.2 oz (96.7 kg)   SpO2 95%   BMI 30.59 kg/m   Body mass index is 30.59 kg/m.  Advanced Directives 11/08/2019 11/07/2018 12/01/2017 06/28/2017 06/23/2017 03/14/2017 08/31/2016  Does Patient Have a Medical Advance Directive? Yes Yes Yes No No No Yes  Type of Paramedic of [place];Living will [place];Living will Living will - - - [place];Living will  Does patient want to make changes to medical advance directive? No - Patient declined - No - Patient declined - - - -  Copy of [place] in Chart? No - copy requested No - copy requested - - - - No - copy requested  Would patient like information on creating a medical advance directive? - - - No - Patient declined No - Patient declined No - Patient declined -    Tobacco Social History   Tobacco Use  Smoking Status Former Smoker  . Packs/day: 1.00  . Quit date: 07/14/1983  . Years since quitting: 36.3  Smokeless Tobacco Never Used     Counseling given: No   Clinical Intake:  Pre-visit preparation completed: Yes  Pain : No/denies pain Pain Score:  0-No pain     BMI - recorded: 30.6 Nutritional Status: BMI > 30  Obese Nutritional Risks: None Diabetes: Yes CBG done?: No Did pt. bring in CBG monitor from home?: No  How often do you need to have someone help you when you read instructions, pamphlets, or other written materials from your doctor or pharmacy?: 1 - Never  Interpreter Needed?: No  Information entered by :: Sheral Flow, LPN  Past Medical History:  Diagnosis Date  . Allergy    Rhinitis  . Anemia    NOS iron deficient and B12 deficient  . Arthritis   . Diabetes mellitus    Type 2  . GERD (gastroesophageal reflux disease)   . Hyperlipidemia   . Hypertension   . 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 OSA (obstructive sleep apnea)    cpap  . TBI (traumatic brain injury) [facility])    Past Surgical History:  Procedure Laterality Date  . APPENDECTOMY    . arm fracture Left 2011   with hardware  . BURR HOLE Bilateral 08/04/2017   Procedure: Haskell Flirt;  Surgeon: [name], MD;  Location: [place];  Service: Neurosurgery;  Laterality: Bilateral;  . COLONOSCOPY    . CRANIOTOMY Left 12/01/2017   Procedure: Trudee Kuster Holes CRANIOTOMY HEMATOMA EVACUATION SUBDURAL;  Surgeon: [name], MD;  Location: [place];  Service: Neurosurgery;  Laterality: Left;  . ESOPHAGOGASTRODUODENOSCOPY  2006   gastritis  .  ROTATOR CUFF REPAIR    . SEPTOPLASTY  1959   Deviated Septum  . 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 Heart disease Mother   . Breast cancer Mother   . Stroke Father   . Allergies Father        Father and children  . Coronary artery disease Brother   . Diabetes Neg Hx   . Colon cancer Neg Hx    Social History   Socioeconomic History  . Marital status: Married    Spouse name: Not on file  . Number of children: 1  . Years of education: Not on file  . Highest education level: Not on file  Occupational History  . Not on file  Tobacco Use  . Smoking status:  Former Smoker    Packs/day: 1.00    Quit date: 07/14/1983    Years since quitting: 36.3  . Smokeless tobacco: Never Used  Substance and Sexual Activity  . Alcohol use: Yes    Alcohol/week: 2.0 standard drinks    Types: 2 Standard drinks or equivalent per week    Comment: occassionally  . Drug use: No  . Sexual activity: Never  Other Topics Concern  . Not on file  Social History Narrative  . Not on file   Social Determinants of Health   Financial Resource Strain:   . Difficulty of Paying Living Expenses:   Food Insecurity:   . Worried About Charity fundraiser in the Last Year:   . Arboriculturist in the Last Year:   Transportation Needs:   . Film/video editor (Medical):   [name] Lack of Transportation (Non-Medical):   Physical Activity:   . Days of Exercise per Week:   . Minutes of Exercise per Session:   Stress:   . Feeling of Stress :   Social Connections:   . Frequency of Communication with Friends and Family:   . Frequency of Social Gatherings with Friends and Family:   . Attends Religious Services:   . Active Member of Clubs or Organizations:   . Attends Archivist Meetings:   [name] Marital Status:     Outpatient Encounter Medications as of 11/08/2019  Medication Sig  . amLODipine (NORVASC) 10 MG tablet TAKE 1 TABLET BY MOUTH  DAILY  . atorvastatin (LIPITOR) 20 MG tablet TAKE ONE-HALF TABLET BY  MOUTH DAILY  . azelastine (ASTELIN) 0.1 % nasal spray Place 1 spray into both nostrils 2 (two) times daily. Use in each nostril as directed  . Carbonyl Iron 25 MG TABS Take 25 mg by mouth daily.  . Cholecalciferol (VITAMIN D3) 50 MCG (2000 UT) TABS Take 1 tablet by mouth once daily  . doxazosin (CARDURA) 4 MG tablet TAKE ONE-HALF TABLET BY  MOUTH AT BEDTIME  . FLUoxetine (PROZAC) 20 MG capsule TAKE 1 CAPSULE BY MOUTH  DAILY  . fluticasone (FLONASE) 50 MCG/ACT nasal spray Place 1 spray into both nostrils daily as needed for allergies or rhinitis.  [name] FLUZONE HIGH-DOSE  QUADRIVALENT 0.7 ML SUSY TO BE ADMINISTERED BY PHARMACIST FOR IMMUNIZATION  . glipiZIDE (GLUCOTROL XL) 10 MG 24 hr tablet TAKE 1 TABLET BY MOUTH  DAILY  . levocetirizine (XYZAL) 5 MG tablet Take 1 tablet (5 mg total) by mouth every evening.  . metFORMIN (GLUCOPHAGE) 1000 MG tablet TAKE 1 TABLET BY MOUTH  TWICE DAILY WITH A MEAL  . oxybutynin (DITROPAN) 5 MG tablet TAKE 1 TABLET  BY MOUTH  TWICE DAILY  . pantoprazole (PROTONIX) 40 MG tablet TAKE 1 TABLET BY MOUTH  DAILY  . telmisartan (MICARDIS) 80 MG tablet TAKE 1 TABLET BY MOUTH  DAILY  . triamcinolone cream (KENALOG) 0.1 %   . [DISCONTINUED] Cholecalciferol (VITAMIN D3) 50 MCG (2000 UT) TABS Take 1 tablet by mouth once daily   No facility-administered encounter medications on file as of 11/08/2019.    Activities of Daily Living In your present state of health, do you have any difficulty performing the following activities: 11/08/2019  Hearing? N  Vision? N  Difficulty concentrating or making decisions? Y  Walking or climbing stairs? N  Dressing or bathing? N  Doing errands, shopping? N  Preparing Food and eating ? N  Using the Toilet? N  In the past six months, have you accidently leaked urine? N  Do you have problems with loss of bowel control? N  Managing your Medications? N  Managing your Finances? N  Housekeeping or managing your Housekeeping? N  Some recent data might be hidden    Patient Care Team: Janith Lima, MD as PCP - General (Internal Medicine) [name], MD as Consulting Physician (Neurosurgery)   Assessment:   This is a routine wellness examination for Frank [name].  Exercise Activities and Dietary recommendations Current Exercise Habits: Home exercise routine, Type of exercise: walking, Time (Minutes): 25, Frequency (Times/Week): 7, Weekly Exercise (Minutes/Week): 175, Intensity: Moderate, Exercise limited by: None identified  Goals    . Client understands the importance of follow-up with providers by attending  scheduled visits    . Exercise 3x per week (60 min per time)     Walk in my neighborhood and/or go to the [facility] 3 times per week.     . Patient Stated     Continue to gain strength physically by staying active, increase the amount of water I drink daily, and I want to start to drive again.     . Patient Stated     To be around next year       Fall Risk Fall Risk  11/08/2019 11/07/2018 07/27/2017 08/31/2016 07/10/2015  Falls in the past year? 0 1 Yes Yes -  Number falls in past yr: 0 0 2 or more 2 or more 1  Injury with Fall? 0 0 No - No  Risk Factor Category  - - - High Fall Risk -  Risk for fall due to : No Fall Risks History of fall(s);Impaired balance/gait;Impaired mobility Impaired balance/gait;Impaired mobility;History of fall(s) Impaired balance/gait;Impaired mobility History of fall(s)  Risk for fall due to: Comment - severe cranial injury with a past fall - - -  Follow up Falls evaluation completed;Education provided - - - Education provided   Is the patient's home free of loose throw rugs in walkways, pet beds, electrical cords, etc?   yes      Grab bars in the bathroom? yes      Handrails on the stairs?   yes      Adequate lighting?   yes  Depression Screen PHQ 2/9 Scores 11/08/2019 04/20/2019 11/07/2018 05/25/2018  PHQ - 2 Score 0 0 0 0  PHQ- 9 Score - 0 2 1          Immunization History  Administered Date(s) Administered  . Influenza Split 05/06/2011, 03/23/2012  . Influenza Whole 04/17/2009, 04/16/2010  . Influenza, High Dose Seasonal PF 03/11/2016, 05/04/2017, 03/25/2018, 03/07/2019  . Influenza,inj,Quad PF,6+ Mos 04/04/2013, 04/06/2014, 03/06/2015  . PFIZER SARS-COV-2 Vaccination  07/29/2019, 08/19/2019  . Pneumococcal Conjugate-13 11/01/2013  . Pneumococcal Polysaccharide-23 07/13/2001, 09/17/2010, 05/04/2017  . Td 01/16/2010  . Zoster 05/06/2011  . Zoster Recombinat (Shingrix) 10/19/2017, 01/04/2018    Qualifies for Shingles Vaccine? Completed  Screening  Tests Health Maintenance  Topic Date Due  . OPHTHALMOLOGY EXAM  09/19/2019  . TETANUS/TDAP  01/17/2020  . INFLUENZA VACCINE  02/11/2020  . COVID-19 Vaccine  Completed  . PNA vac Low Risk Adult  Completed   Cancer Screenings: Lung: Low Dose CT Chest recommended if Age 62-80 years, 30 pack-year currently smoking OR have quit w/in 15years. Patient does not qualify. Colorectal: yes, not recommended due to age    Plan:     Reviewed health maintenance screenings with patient today and relevant education, vaccines, and/or referrals were provided.    Continue doing brain stimulating activities (puzzles, reading, adult coloring books, staying active) to keep memory sharp.    Continue to eat heart healthy diet (full of fruits, vegetables, whole grains, lean protein, water--limit salt, fat, and sugar intake) and increase physical activity as tolerated.  I have personally reviewed and noted the following in the patient's chart:   . Medical and social history . Use of alcohol, tobacco or illicit drugs  . Current medications and supplements . Functional ability and status . Nutritional status . Physical activity . Advanced directives . List of other physicians . Hospitalizations, surgeries, and ER visits in previous 12 months . Vitals . Screenings to include cognitive, depression, and falls . Referrals and appointments  In addition, I have reviewed and discussed with patient certain preventive protocols, quality metrics, and best practice recommendations. A written personalized care plan for preventive services as well as general preventive health recommendations were provided to patient.     Sheral Flow, LPN  [id number]  Nurse Health Advisor  Medical screening examination/treatment/procedure(s) were performed by non-physician practitioner and as supervising physician I was immediately available for consultation/collaboration. I agree with above. Scarlette Calico, MD

## 2019-11-08 NOTE — Patient Instructions (Addendum)
Frank [name] , Thank you for taking time to come for your Medicare Wellness Visit. I appreciate your ongoing commitment to your health goals. Please review the following plan we discussed and let me know if I can assist you in the future.   Screening recommendations/referrals: Colorectal Screening: 08/30/2013  Vision and Dental Exams: Recommended annual ophthalmology exams for early detection of glaucoma and other disorders of the eye Recommended annual dental exams for proper oral hygiene  Diabetic Exams: Diabetic Eye Exam: 09/19/2018 Diabetic Foot Exam: 08/21/2019  Vaccinations: Influenza vaccine: 03/07/2019 Pneumococcal vaccine: completed; Prevnar 11/01/2013, Pneumovax 05/04/2017 Tdap vaccine: 01/16/2010; Due every 10 years Zoster vaccine: 05/06/2011 Shingles vaccine: completed; Shingrix 10/19/2017,  01/04/2018 Covid vaccine: completed; [place] 07/29/2019, 08/19/2019  Advanced directives: Advance directives discussed with you today. Please bring a copy of your POA (Power of [place]) and/or Living Will to your next appointment.  Goals:  Recommend to drink at least 6-8 8oz glasses of water per day.  Recommend to exercise for at least 150 minutes per week.  Recommend to remove any items from the home that may cause slips or trips.  Recommend to decrease portion sizes by eating 3 small healthy meals and at least 2 healthy snacks per day.  Recommend to begin DASH diet as directed below  Recommend to continue efforts to reduce smoking habits until no longer smoking. Smoking Cessation literature is attached below.  Next appointment: Please schedule your Annual Wellness Visit with your Nurse Health Advisor in one year.  Preventive Care 81 Years and Older, Male Preventive care refers to lifestyle choices and visits with your health care provider that can promote health and wellness. What does preventive care include?  A yearly physical exam. This is also called an annual well check.  Dental  exams once or twice a year.  Routine eye exams. Ask your health care provider how often you should have your eyes checked.  Personal lifestyle choices, including:  Daily care of your teeth and gums.  Regular physical activity.  Eating a healthy diet.  Avoiding tobacco and drug use.  Limiting alcohol use.  Practicing safe sex.  Taking low doses of aspirin every day if recommended by your health care provider..  Taking vitamin and mineral supplements as recommended by your health care provider. What happens during an annual well check? The services and screenings done by your health care provider during your annual well check will depend on your age, overall health, lifestyle risk factors, and family history of disease. Counseling  Your health care provider may ask you questions about your:  Alcohol use.  Tobacco use.  Drug use.  Emotional well-being.  Home and relationship well-being.  Sexual activity.  Eating habits.  History of falls.  Memory and ability to understand (cognition).  Work and work Statistician. Screening  You may have the following tests or measurements:  Height, weight, and BMI.  Blood pressure.  Lipid and cholesterol levels. These may be checked every 5 years, or more frequently if you are over 62 years old.  Skin check.  Lung cancer screening. You may have this screening every year starting at age 46 if you have a 30-pack-year history of smoking and currently smoke or have quit within the past 15 years.  Fecal occult blood test (FOBT) of the stool. You may have this test every year starting at age 80.  Flexible sigmoidoscopy or colonoscopy. You may have a sigmoidoscopy every 5 years or a colonoscopy every 10 years starting at age 46.  Prostate cancer screening. Recommendations will vary depending on your family history and other risks.  Hepatitis C blood test.  Hepatitis B blood test.  Sexually transmitted disease (STD)  testing.  Diabetes screening. This is done by checking your blood sugar (glucose) after you have not eaten for a while (fasting). You may have this done every 1-3 years.  Abdominal aortic aneurysm (AAA) screening. You may need this if you are a current or former smoker.  Osteoporosis. You may be screened starting at age 79 if you are at high risk. Talk with your health care provider about your test results, treatment options, and if necessary, the need for more tests. Vaccines  Your health care provider may recommend certain vaccines, such as:  Influenza vaccine. This is recommended every year.  Tetanus, diphtheria, and acellular pertussis (Tdap, Td) vaccine. You may need a Td booster every 10 years.  Zoster vaccine. You may need this after age 41.  Pneumococcal 13-valent conjugate (PCV13) vaccine. One dose is recommended after age 62.  Pneumococcal polysaccharide (PPSV23) vaccine. One dose is recommended after age 60. Talk to your health care provider about which screenings and vaccines you need and how often you need them. This information is not intended to replace advice given to you by your health care provider. Make sure you discuss any questions you have with your health care provider. Document Released: 07/26/2015 Document Revised: 03/18/2016 Document Reviewed: 04/30/2015 Elsevier Interactive Patient Education  2017 [employer] Prevention in the Home Falls can cause injuries. They can happen to people of all ages. There are many things you can do to make your home safe and to help prevent falls. What can I do on the outside of my home?  Regularly fix the edges of walkways and driveways and fix any cracks.  Remove anything that might make you trip as you walk through a door, such as a raised step or threshold.  Trim any bushes or trees on the path to your home.  Use bright outdoor lighting.  Clear any walking paths of anything that might make someone trip, such as  rocks or tools.  Regularly check to see if handrails are loose or broken. Make sure that both sides of any steps have handrails.  Any raised decks and porches should have guardrails on the edges.  Have any leaves, snow, or ice cleared regularly.  Use sand or salt on walking paths during winter.  Clean up any spills in your garage right away. This includes oil or grease spills. What can I do in the bathroom?  Use night lights.  Install grab bars by the toilet and in the tub and shower. Do not use towel bars as grab bars.  Use non-skid mats or decals in the tub or shower.  If you need to sit down in the shower, use a plastic, non-slip stool.  Keep the floor dry. Clean up any water that spills on the floor as soon as it happens.  Remove soap buildup in the tub or shower regularly.  Attach bath mats securely with double-sided non-slip rug tape.  Do not have throw rugs and other things on the floor that can make you trip. What can I do in the bedroom?  Use night lights.  Make sure that you have a light by your bed that is easy to reach.  Do not use any sheets or blankets that are too big for your bed. They should not hang down onto the floor.  Have  a firm chair that has side arms. You can use this for support while you get dressed.  Do not have throw rugs and other things on the floor that can make you trip. What can I do in the kitchen?  Clean up any spills right away.  Avoid walking on wet floors.  Keep items that you use a lot in easy-to-reach places.  If you need to reach something above you, use a strong step stool that has a grab bar.  Keep electrical cords out of the way.  Do not use floor polish or wax that makes floors slippery. If you must use wax, use non-skid floor wax.  Do not have throw rugs and other things on the floor that can make you trip. What can I do with my stairs?  Do not leave any items on the stairs.  Make sure that there are handrails on  both sides of the stairs and use them. Fix handrails that are broken or loose. Make sure that handrails are as long as the stairways.  Check any carpeting to make sure that it is firmly attached to the stairs. Fix any carpet that is loose or worn.  Avoid having throw rugs at the top or bottom of the stairs. If you do have throw rugs, attach them to the floor with carpet tape.  Make sure that you have a light switch at the top of the stairs and the bottom of the stairs. If you do not have them, ask someone to add them for you. What else can I do to help prevent falls?  Wear shoes that:  Do not have high heels.  Have rubber bottoms.  Are comfortable and fit you well.  Are closed at the toe. Do not wear sandals.  If you use a stepladder:  Make sure that it is fully opened. Do not climb a closed stepladder.  Make sure that both sides of the stepladder are locked into place.  Ask someone to hold it for you, if possible.  Clearly mark and make sure that you can see:  Any grab bars or handrails.  First and last steps.  Where the edge of each step is.  Use tools that help you move around (mobility aids) if they are needed. These include:  Canes.  Walkers.  Scooters.  Crutches.  Turn on the lights when you go into a dark area. Replace any light bulbs as soon as they burn out.  Set up your furniture so you have a clear path. Avoid moving your furniture around.  If any of your floors are uneven, fix them.  If there are any pets around you, be aware of where they are.  Review your medicines with your doctor. Some medicines can make you feel dizzy. This can increase your chance of falling. Ask your doctor what other things that you can do to help prevent falls. This information is not intended to replace advice given to you by your health care provider. Make sure you discuss any questions you have with your health care provider. Document Released: 04/25/2009 Document  Revised: 12/05/2015 Document Reviewed: 08/03/2014 Elsevier Interactive Patient Education  2017 [employer].

## 2019-11-20 ENCOUNTER — Ambulatory Visit: Payer: Medicare Other | Admitting: Internal Medicine

## 2019-11-21 ENCOUNTER — Telehealth: Payer: Self-pay | Admitting: Internal Medicine

## 2019-11-21 DIAGNOSIS — N138 Other obstructive and reflux uropathy: Secondary | ICD-10-CM

## 2019-11-21 NOTE — Progress Notes (Signed)
  Chronic Care Management   Outreach Note  11/21/2019 Name: Frank [name] MRN: [id number] DOB: 09-27-1940  Referred by: Janith Lima, MD Reason for referral : No chief complaint on file.   An unsuccessful telephone outreach was attempted today. The patient was referred to the pharmacist for assistance with care management and care coordination.   This note is not being shared with the patient for the following reason: To respect privacy (The patient or proxy has requested that the information not be shared).  Follow Up Plan:   Earney Hamburg Upstream Scheduler

## 2019-11-21 NOTE — Progress Notes (Signed)
ERROR, ACCEPT WAS NOT SELECTED.  This note is not being shared with the patient for the following reason: To respect privacy (The patient or proxy has requested that the information not be shared).  Earney Hamburg Upstream Scheduler

## 2019-11-21 NOTE — Progress Notes (Signed)
  Chronic Care Management   Outreach Note  11/21/2019 Name: Frank [name] MRN: [id number] DOB: [birth year]  Referred by: Janith Lima, MD Reason for referral : Chronic Care Management (INITIAL CCM OUTREACH)    Chronic Care Management   Note  11/21/2019 Name: Frank [name] MRN: [id number] DOB: [birth year]  [name] is a 79 y.o. year old male who is a primary care patient of Janith Lima, MD. I reached out to [name] by phone today in response to a referral sent by Mr. [name]'s PCP, Janith Lima, MD.   [name] was given information about Chronic Care Management services today including:  1. CCM service includes personalized support from designated clinical staff supervised by his physician, including individualized plan of care and coordination with other care providers 2. 24/7 contact phone numbers for assistance for urgent and routine care needs. 3. Service will only be billed when office clinical staff spend 20 minutes or more in a month to coordinate care. 4. Only one practitioner may furnish and bill the service in a calendar month. 5. The patient may stop CCM services at any time (effective at the end of the month) by phone call to the office staff.   Patient agreed to services and verbal consent obtained.   This note is not being shared with the patient for the following reason: To respect privacy (The patient or proxy has requested that the information not be shared).  Follow up plan:   Earney Hamburg Upstream Scheduler  Follow Up Plan:   SIGNATURE

## 2019-11-27 ENCOUNTER — Encounter: Payer: Self-pay | Admitting: Internal Medicine

## 2019-11-27 ENCOUNTER — Other Ambulatory Visit: Payer: Self-pay

## 2019-11-27 ENCOUNTER — Ambulatory Visit (INDEPENDENT_AMBULATORY_CARE_PROVIDER_SITE_OTHER): Payer: Medicare Other | Admitting: Internal Medicine

## 2019-11-27 VITALS — BP 136/58 | HR 75 | Temp 98.3°F | Resp 16 | Ht 70.0 in | Wt 218.0 lb

## 2019-11-27 DIAGNOSIS — Z23 Encounter for immunization: Secondary | ICD-10-CM | POA: Diagnosis not present

## 2019-11-27 DIAGNOSIS — N1832 Chronic kidney disease, stage 3b: Secondary | ICD-10-CM

## 2019-11-27 DIAGNOSIS — D510 Vitamin B12 deficiency anemia due to intrinsic factor deficiency: Secondary | ICD-10-CM | POA: Diagnosis not present

## 2019-11-27 DIAGNOSIS — J301 Allergic rhinitis due to pollen: Secondary | ICD-10-CM

## 2019-11-27 DIAGNOSIS — E1142 Type 2 diabetes mellitus with diabetic polyneuropathy: Secondary | ICD-10-CM

## 2019-11-27 DIAGNOSIS — F0280 Dementia in other diseases classified elsewhere without behavioral disturbance: Secondary | ICD-10-CM

## 2019-11-27 DIAGNOSIS — I10 Essential (primary) hypertension: Secondary | ICD-10-CM | POA: Diagnosis not present

## 2019-11-27 LAB — BASIC METABOLIC PANEL
BUN: 35 mg/dL — ABNORMAL HIGH (ref 6–23)
CO2: 27 mEq/L (ref 19–32)
Calcium: 9.1 mg/dL (ref 8.4–10.5)
Chloride: 102 mEq/L (ref 96–112)
Creatinine, Ser: 1.89 mg/dL — ABNORMAL HIGH (ref 0.40–1.50)
GFR: 34.39 mL/min — ABNORMAL LOW (ref >60.00)
Glucose, Bld: 260 mg/dL — ABNORMAL HIGH (ref 70–99)
Potassium: 5 mEq/L (ref 3.5–5.1)
Sodium: 136 mEq/L (ref 135–145)

## 2019-11-27 LAB — CBC WITH DIFFERENTIAL/PLATELET
Basophils Absolute: 0 10*3/uL (ref 0.0–0.1)
Basophils Relative: 0.4 % (ref 0.0–3.0)
Eosinophils Absolute: 2.7 10*3/uL — ABNORMAL HIGH (ref 0.0–0.7)
Eosinophils Relative: 31.3 % — ABNORMAL HIGH (ref 0.0–5.0)
HCT: 31.6 % — ABNORMAL LOW (ref 39.0–52.0)
Hemoglobin: 10.4 g/dL — ABNORMAL LOW (ref 13.0–17.0)
Lymphocytes Relative: 17.7 % (ref 12.0–46.0)
Lymphs Abs: 1.5 10*3/uL (ref 0.7–4.0)
MCHC: 32.9 g/dL (ref 30.0–36.0)
MCV: 84.9 fl (ref 78.0–100.0)
Monocytes Absolute: 0.7 10*3/uL (ref 0.1–1.0)
Monocytes Relative: 7.9 % (ref 3.0–12.0)
Neutro Abs: 3.7 10*3/uL (ref 1.4–7.7)
Neutrophils Relative %: 42.7 % — ABNORMAL LOW (ref 43.0–77.0)
Platelets: 168 10*3/uL (ref 150.0–400.0)
RBC: 3.72 Mil/uL — ABNORMAL LOW (ref 4.22–5.81)
RDW: 13.9 % (ref 11.5–15.5)
WBC: 8.7 10*3/uL (ref 4.0–10.5)

## 2019-11-27 LAB — URINALYSIS, ROUTINE W REFLEX MICROSCOPIC
Bilirubin Urine: NEGATIVE
Hgb urine dipstick: NEGATIVE
Ketones, ur: NEGATIVE
Leukocytes,Ua: NEGATIVE
Nitrite: NEGATIVE
RBC / HPF: NONE SEEN (ref 0–?)
Specific Gravity, Urine: 1.025 (ref 1.000–1.030)
Urine Glucose: NEGATIVE
Urobilinogen, UA: 0.2 (ref 0.0–1.0)
WBC, UA: NONE SEEN (ref 0–?)
pH: 5.5 (ref 5.0–8.0)

## 2019-11-27 LAB — MICROALBUMIN / CREATININE URINE RATIO
Creatinine,U: 119.2 mg/dL
Microalb Creat Ratio: 9.2 mg/g (ref 0.0–30.0)
Microalb, Ur: 10.9 mg/dL — ABNORMAL HIGH (ref 0.0–1.9)

## 2019-11-27 LAB — HEMOGLOBIN A1C: Hgb A1c MFr Bld: 6.9 % — ABNORMAL HIGH (ref 4.6–6.5)

## 2019-11-27 MED ORDER — CYANOCOBALAMIN 1000 MCG/ML IJ SOLN
1000.0000 ug | Freq: Once | INTRAMUSCULAR | Status: AC
  Administered 2019-11-27: 1000 ug via INTRAMUSCULAR

## 2019-11-27 MED ORDER — AZELASTINE HCL 0.1 % NA SOLN: 1.0000 | Freq: Two times a day (BID) | NASAL | 1 refills | Status: DC

## 2019-11-27 MED ORDER — LEVOCETIRIZINE DIHYDROCHLORIDE 5 MG PO TABS: 5.0000 mg | ORAL_TABLET | Freq: Every evening | ORAL | 1 refills | Status: DC

## 2019-11-27 NOTE — Progress Notes (Signed)
Subjective:  Patient ID: Frank [name], male    DOB: 09-27-1940  Age: 79 y.o. MRN: [id number]  CC: Hypertension, Diabetes, and Anemia  This visit occurred during the SARS-CoV-2 public health emergency.  Safety protocols were in place, including screening questions prior to the visit, additional usage of staff PPE, and extensive cleaning of exam room while observing appropriate contact time as indicated for disinfecting solutions.    HPI Frank [name] presents for f/up - Over the last year he has noticed a gradual decline in his memory with episodes of forgetfulness and word finding difficulty.  He denies any recent episodes of headache or blurred vision.  He says his strength and balance are stable.  He also complains of chronic postnasal drip with throat clearing.  He is not getting much symptom relief with a steroid nasal spray and an oral antihistamine.  Outpatient Medications Prior to Visit  Medication Sig Dispense Refill  . amLODipine (NORVASC) 10 MG tablet TAKE 1 TABLET BY MOUTH  DAILY 90 tablet 1  . atorvastatin (LIPITOR) 20 MG tablet TAKE ONE-HALF TABLET BY  MOUTH DAILY 45 tablet 1  . Carbonyl Iron 25 MG TABS Take 25 mg by mouth daily.    . Cholecalciferol (VITAMIN D3) 50 MCG (2000 UT) TABS Take 1 tablet by mouth once daily 90 tablet 1  . doxazosin (CARDURA) 4 MG tablet TAKE ONE-HALF TABLET BY  MOUTH AT BEDTIME 45 tablet 1  . FLUoxetine (PROZAC) 20 MG capsule TAKE 1 CAPSULE BY MOUTH  DAILY 90 capsule 1  . fluticasone (FLONASE) 50 MCG/ACT nasal spray Place 1 spray into both nostrils daily as needed for allergies or rhinitis.    [name] glipiZIDE (GLUCOTROL XL) 10 MG 24 hr tablet TAKE 1 TABLET BY MOUTH  DAILY 90 tablet 1  . metFORMIN (GLUCOPHAGE) 1000 MG tablet TAKE 1 TABLET BY MOUTH  TWICE DAILY WITH A MEAL 180 tablet 3  . oxybutynin (DITROPAN) 5 MG tablet TAKE 1 TABLET BY MOUTH  TWICE DAILY 180 tablet 1  . pantoprazole (PROTONIX) 40 MG tablet TAKE 1 TABLET BY MOUTH  DAILY  90 tablet 3  . telmisartan (MICARDIS) 80 MG tablet TAKE 1 TABLET BY MOUTH  DAILY 90 tablet 1  . triamcinolone cream (KENALOG) 0.1 %     . azelastine (ASTELIN) 0.1 % nasal spray Place 1 spray into both nostrils 2 (two) times daily. Use in each nostril as directed 90 mL 1  . FLUZONE HIGH-DOSE QUADRIVALENT 0.7 ML SUSY TO BE ADMINISTERED BY PHARMACIST FOR IMMUNIZATION    . levocetirizine (XYZAL) 5 MG tablet Take 1 tablet (5 mg total) by mouth every evening. 90 tablet 1   No facility-administered medications prior to visit.    ROS Review of Systems  Constitutional: Negative.  Negative for appetite change, chills, diaphoresis, fatigue and fever.  HENT: Positive for postnasal drip and rhinorrhea. Negative for congestion, facial swelling, nosebleeds, sinus pressure, sinus pain, sore throat and tinnitus.   Respiratory: Negative for cough, chest tightness, shortness of breath and wheezing.   Cardiovascular: Negative for chest pain, palpitations and leg swelling.  Gastrointestinal: Negative for abdominal pain, constipation, diarrhea, nausea and vomiting.  Endocrine: Negative.   Genitourinary: Negative.  Negative for difficulty urinating and hematuria.  Musculoskeletal: Negative for arthralgias, myalgias and neck pain.  Skin: Negative.  Negative for color change and pallor.  Neurological: Negative.  Negative for dizziness, weakness, light-headedness, numbness and headaches.  Hematological: Negative.  Negative for adenopathy. Does not bruise/bleed easily.  Psychiatric/Behavioral: Positive  for decreased concentration. Negative for agitation, behavioral problems, confusion, dysphoric mood, hallucinations, self-injury, sleep disturbance and suicidal ideas. The patient is not nervous/anxious.     Objective:  BP (!) 136/58   Pulse 75   Temp 98.3 F (36.8 C) (Oral)   Resp 16   Ht 5\' 10"  (1.778 m)   Wt 218 lb (98.9 kg)   SpO2 97%   BMI 31.28 kg/m   BP Readings from Last 3 Encounters:  11/28/19 (!)  136/58  11/08/19 120/78  08/21/19 128/68    Wt Readings from Last 3 Encounters:  11/28/19 218 lb (98.9 kg)  11/08/19 213 lb 3.2 oz (96.7 kg)  08/21/19 212 lb 8 oz (96.4 kg)    Physical Exam Vitals reviewed.  Constitutional:      Appearance: Normal appearance.  HENT:     Nose: Rhinorrhea present. No mucosal edema or congestion.     Right Nostril: No epistaxis.     Left Nostril: No epistaxis.     Right Turbinates: Not enlarged, swollen or pale.     Left Turbinates: Not enlarged, swollen or pale.     Right Sinus: No maxillary sinus tenderness or frontal sinus tenderness.     Left Sinus: No maxillary sinus tenderness or frontal sinus tenderness.     Mouth/Throat:     Mouth: Mucous membranes are moist.  Eyes:     General: No scleral icterus.    Conjunctiva/sclera: Conjunctivae normal.  Cardiovascular:     Rate and Rhythm: Normal rate and regular rhythm.     Heart sounds: No murmur.  Pulmonary:     Effort: Pulmonary effort is normal.     Breath sounds: No stridor. No wheezing, rhonchi or rales.  Abdominal:     General: Abdomen is flat. Bowel sounds are normal. There is no distension.     Palpations: Abdomen is soft. There is no hepatomegaly, splenomegaly or mass.     Tenderness: There is no abdominal tenderness.  Musculoskeletal:        General: Normal range of motion.     Cervical back: Neck supple.  Lymphadenopathy:     Cervical: No cervical adenopathy.  Skin:    General: Skin is warm and dry.     Findings: No rash.  Neurological:     General: No focal deficit present.     Mental Status: He is alert and oriented to person, place, and time. Mental status is at baseline.     Sensory: No sensory deficit.     Motor: No weakness.     Coordination: Coordination abnormal.     Gait: Gait abnormal.     Deep Tendon Reflexes: Reflexes normal.  Psychiatric:        Mood and Affect: Mood normal.        Behavior: Behavior normal.     Lab Results  Component Value Date   WBC  8.7 11/27/2019   HGB 10.4 (L) 11/27/2019   HCT 31.6 (L) 11/27/2019   PLT 168.0 11/27/2019   GLUCOSE 260 (H) 11/27/2019   CHOL 130 04/20/2019   TRIG 89.0 04/20/2019   HDL 46.10 04/20/2019   LDLCALC 66 04/20/2019   ALT 19 08/04/2017   AST 20 08/04/2017   NA 136 11/27/2019   K 5.0 11/27/2019   CL 102 11/27/2019   CREATININE 1.89 (H) 11/27/2019   BUN 35 (H) 11/27/2019   CO2 27 11/27/2019   TSH 2.02 05/25/2018   PSA 1.08 07/10/2015   INR 0.98 08/04/2017   HGBA1C  6.9 (H) 11/27/2019   MICROALBUR 10.9 (H) 11/27/2019    DG Chest 2 View  Result Date: 11/21/2018 CLINICAL DATA:  Cough EXAM: CHEST - 2 VIEW COMPARISON:  03/18/2016 FINDINGS: There is an old deformity of the fifth rib anterior laterally on the right. The cardiac silhouette is mildly enlarged. There is no pneumothorax. There is an old healed fracture of the right clavicle. The lungs are somewhat hyperexpanded with persistent blunting of the right costophrenic angle. Aortic calcifications are noted. IMPRESSION: 1. No acute cardiopulmonary process identified. 2. Chronic changes as above. Electronically Signed   By: Constance Holster M.D.   On: 11/21/2018 15:46    Assessment & Plan:   Gurpreet was seen today for hypertension, diabetes and anemia.  Diagnoses and all orders for this visit:  Essential hypertension- His blood pressure is adequately well controlled.  Renal function is stable.  Electrolytes are normal. -     CBC with Differential/Platelet; Future -     Basic metabolic panel; Future -     Urinalysis, Routine w reflex microscopic; Future -     Urinalysis, Routine w reflex microscopic -     Basic metabolic panel -     CBC with Differential/Platelet  Type 2 diabetes mellitus with peripheral neuropathy ([place])- His blood sugar is adequately well controlled.  Will continue the current dose of the sulfonylurea and Metformin. -     Basic metabolic panel; Future -     Microalbumin / creatinine urine ratio; Future -      Hemoglobin A1c; Future -     Hemoglobin A1c -     Microalbumin / creatinine urine ratio -     Basic metabolic panel  Stage 3b chronic kidney disease- His renal function is stable.  He will avoid nephrotoxic agents.  Will continue to maintain tight control of his blood pressure and his blood sugar. 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  Will continue B12 replacement therapy.  I think the reason his anemia has not improved much is because it is related to aging and the anemia of chronic disease. -     CBC with Differential/Platelet; Future -     cyanocobalamin ((VITAMIN B-12)) injection 1,000 mcg -     CBC with Differential/Platelet  Seasonal allergic rhinitis due to pollen- Will add a nasal antihistamine for additional symptom relief. -     azelastine (ASTELIN) 0.1 % nasal spray; Place 1 spray into both nostrils 2 (two) times daily. Use in each nostril as directed -     levocetirizine (XYZAL) 5 MG tablet; Take 1 tablet (5 mg total) by mouth every evening.  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  There are no pharmaceutical options to treat this.  He will continue with lifestyle measures to improve his cognitive functioning.   I have discontinued [name] [name] "ken"'s Fluzone High-Dose Quadrivalent. I am also having him maintain his Carbonyl Iron, fluticasone, metFORMIN, pantoprazole, telmisartan, atorvastatin, amLODipine, doxazosin, triamcinolone cream, Vitamin D3, oxybutynin, glipiZIDE, FLUoxetine, azelastine, and levocetirizine. We administered cyanocobalamin.  Meds ordered this encounter  Medications  . cyanocobalamin ((VITAMIN B-12))  injection 1,000 mcg  .  azelastine (ASTELIN) 0.1 % nasal spray    Sig: Place 1 spray into both nostrils 2 (two) times daily. Use in each nostril as directed    Dispense:  90 mL    Refill:  1  . levocetirizine (XYZAL) 5 MG tablet    Sig: Take 1 tablet (5 mg total) by mouth every evening.    Dispense:  90 tablet    Refill:  1   I spent 50 minutes in preparing to see the patient by review of recent labs, imaging and procedures, obtaining and reviewing separately obtained history, communicating with the patient and family or caregiver, ordering medications, tests or procedures, and documenting clinical information in the EHR including the differential Dx, treatment, and any further evaluation and other management of 1. Essential hypertension 2. Type 2 diabetes mellitus with peripheral neuropathy ([name]) 3. Stage 3b chronic kidney disease 4. Vitamin B12 deficiency anemia due to intrinsic factor deficiency 5. Seasonal allergic rhinitis due to pollen 6. 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  Scarlette Calico, MD

## 2019-11-27 NOTE — Patient Instructions (Signed)
Type 2 Diabetes Mellitus, Diagnosis, Adult Type 2 diabetes (type 2 diabetes mellitus) is a long-term (chronic) disease. In type 2 diabetes, one or both of these problems may be present:  The pancreas does not make enough of a hormone called insulin.  Cells in the body do not respond properly to insulin that the body makes (insulin resistance). Normally, insulin allows blood sugar (glucose) to enter cells in the body. The cells use glucose for energy. Insulin resistance or lack of insulin causes excess glucose to build up in the blood instead of going into cells. As a result, high blood glucose (hyperglycemia) develops. What increases the risk? The following factors may make you more likely to develop type 2 diabetes:  Having a family member with type 2 diabetes.  Being overweight or obese.  Having an inactive (sedentary) lifestyle.  Having been diagnosed with insulin resistance.  Having a history of prediabetes, gestational diabetes, or polycystic ovary syndrome (PCOS).  Being of American-Indian, African-American, Hispanic/Latino, or Asian/Pacific Islander descent. What are the signs or symptoms? In the early stage of this condition, you may not have symptoms. Symptoms develop slowly and may include:  Increased thirst (polydipsia).  Increased hunger(polyphagia).  Increased urination (polyuria).  Increased urination during the night (nocturia).  Unexplained weight loss.  Frequent infections that keep coming back (recurring).  Fatigue.  Weakness.  Vision changes, such as blurry vision.  Cuts or bruises that are slow to heal.  Tingling or numbness in the hands or feet.  Dark patches on the skin (acanthosis nigricans). How is this diagnosed? This condition is diagnosed based on your symptoms, your medical history, a physical exam, and your blood glucose level. Your blood glucose may be checked with one or more of the following blood tests:  A fasting blood glucose (FBG)  test. You will not be allowed to eat (you will fast) for 8 hours or longer before a blood sample is taken.  A random blood glucose test. This test checks blood glucose at any time of day regardless of when you ate.  An A1c (hemoglobin A1c) blood test. This test provides information about blood glucose control over the previous 2-3 months.  An oral glucose tolerance test (OGTT). This test measures your blood glucose at two times: ? After fasting. This is your baseline blood glucose level. ? Two hours after drinking a beverage that contains glucose. You may be diagnosed with type 2 diabetes if:  Your FBG level is 126 mg/dL (7.0 mmol/L) or higher.  Your random blood glucose level is 200 mg/dL (11.1 mmol/L) or higher.  Your A1c level is 6.5% or higher.  Your OGTT result is higher than 200 mg/dL (11.1 mmol/L). These blood tests may be repeated to confirm your diagnosis. How is this treated? Your treatment may be managed by a specialist called an endocrinologist. Type 2 diabetes may be treated by following instructions from your health care provider about:  Making diet and lifestyle changes. This may include: ? Following an individualized nutrition plan that is developed by a diet and nutrition specialist (registered dietitian). ? Exercising regularly. ? Finding ways to manage stress.  Checking your blood glucose level as often as told.  Taking diabetes medicines or insulin daily. This helps to keep your blood glucose levels in the healthy range. ? If you use insulin, you may need to adjust the dosage depending on how physically active you are and what foods you eat. Your health care provider will tell you how to adjust your dosage.    Taking medicines to help prevent complications from diabetes, such as: ? Aspirin. ? Medicine to lower cholesterol. ? Medicine to control blood pressure. Your health care provider will set individualized treatment goals for you. Your goals will be based on  your age, other medical conditions you have, and how you respond to diabetes treatment. Generally, the goal of treatment is to maintain the following blood glucose levels:  Before meals (preprandial): 80-130 mg/dL (4.4-7.2 mmol/L).  After meals (postprandial): below 180 mg/dL (10 mmol/L).  A1c level: less than 7%. Follow these instructions at home: Questions to ask your health care provider  Consider asking the following questions: ? Do I need to meet with a diabetes educator? ? Where can I find a support group for people with diabetes? ? What equipment will I need to manage my diabetes at home? ? What diabetes medicines do I need, and when should I take them? ? How often do I need to check my blood glucose? ? What number can I call if I have questions? ? When is my next appointment? General instructions  Take over-the-counter and prescription medicines only as told by your health care provider.  Keep all follow-up visits as told by your health care provider. This is important.  For more information about diabetes, visit: ? American Diabetes Association (ADA): www.diabetes.org ? American Association of Diabetes Educators (AADE): www.diabeteseducator.org Contact a health care provider if:  Your blood glucose is at or above 240 mg/dL (13.3 mmol/L) for 2 days in a row.  You have been sick or have had a fever for 2 days or longer, and you are not getting better.  You have any of the following problems for more than 6 hours: ? You cannot eat or drink. ? You have nausea and vomiting. ? You have diarrhea. Get help right away if:  Your blood glucose is lower than 54 mg/dL (3.0 mmol/L).  You become confused or you have trouble thinking clearly.  You have difficulty breathing.  You have moderate or large ketone levels in your urine. Summary  Type 2 diabetes (type 2 diabetes mellitus) is a long-term (chronic) disease. In type 2 diabetes, the pancreas does not make enough of a  hormone called insulin, or cells in the body do not respond properly to insulin that the body makes (insulin resistance).  This condition is treated by making diet and lifestyle changes and taking diabetes medicines or insulin.  Your health care provider will set individualized treatment goals for you. Your goals will be based on your age, other medical conditions you have, and how you respond to diabetes treatment.  Keep all follow-up visits as told by your health care provider. This is important. This information is not intended to replace advice given to you by your health care provider. Make sure you discuss any questions you have with your health care provider. Document Revised: 08/27/2017 Document Reviewed: 08/02/2015 Elsevier Patient Education  2020 [employer]  

## 2019-11-28 DIAGNOSIS — Z23 Encounter for immunization: Secondary | ICD-10-CM | POA: Insufficient documentation

## 2019-11-28 DIAGNOSIS — F0280 Dementia in other diseases classified elsewhere without behavioral disturbance: Secondary | ICD-10-CM | POA: Insufficient documentation

## 2019-12-08 ENCOUNTER — Emergency Department (HOSPITAL_BASED_OUTPATIENT_CLINIC_OR_DEPARTMENT_OTHER)
Admission: EM | Admit: 2019-12-08 | Discharge: 2019-12-09 | Disposition: A | Discharge status: Home / Self Care | Payer: Medicare Other | Attending: Emergency Medicine | Admitting: Emergency Medicine

## 2019-12-08 ENCOUNTER — Encounter (HOSPITAL_BASED_OUTPATIENT_CLINIC_OR_DEPARTMENT_OTHER): Payer: Self-pay | Admitting: Emergency Medicine

## 2019-12-08 ENCOUNTER — Telehealth: Payer: Self-pay | Admitting: Internal Medicine

## 2019-12-08 ENCOUNTER — Other Ambulatory Visit: Payer: Self-pay

## 2019-12-08 ENCOUNTER — Emergency Department (HOSPITAL_BASED_OUTPATIENT_CLINIC_OR_DEPARTMENT_OTHER): Payer: Medicare Other

## 2019-12-08 DIAGNOSIS — Z7984 Long term (current) use of oral hypoglycemic drugs: Secondary | ICD-10-CM | POA: Diagnosis not present

## 2019-12-08 DIAGNOSIS — M6281 Muscle weakness (generalized): Secondary | ICD-10-CM | POA: Diagnosis not present

## 2019-12-08 DIAGNOSIS — R531 Weakness: Secondary | ICD-10-CM

## 2019-12-08 DIAGNOSIS — Z87891 Personal history of nicotine dependence: Secondary | ICD-10-CM | POA: Insufficient documentation

## 2019-12-08 DIAGNOSIS — R05 Cough: Secondary | ICD-10-CM | POA: Diagnosis not present

## 2019-12-08 DIAGNOSIS — N179 Acute kidney failure, unspecified: Secondary | ICD-10-CM | POA: Diagnosis not present

## 2019-12-08 DIAGNOSIS — E119 Type 2 diabetes mellitus without complications: Secondary | ICD-10-CM | POA: Insufficient documentation

## 2019-12-08 DIAGNOSIS — I10 Essential (primary) hypertension: Secondary | ICD-10-CM | POA: Diagnosis not present

## 2019-12-08 DIAGNOSIS — Z79899 Other long term (current) drug therapy: Secondary | ICD-10-CM | POA: Diagnosis not present

## 2019-12-08 LAB — BASIC METABOLIC PANEL
Anion gap: 11 (ref 5–15)
BUN: 41 mg/dL — ABNORMAL HIGH (ref 8–23)
CO2: 25 mmol/L (ref 22–32)
Calcium: 8.9 mg/dL (ref 8.9–10.3)
Chloride: 102 mmol/L (ref 98–111)
Creatinine, Ser: 2.3 mg/dL — ABNORMAL HIGH (ref 0.61–1.24)
GFR calc Af Amer: 30 mL/min — ABNORMAL LOW (ref >60)
GFR calc non Af Amer: 26 mL/min — ABNORMAL LOW (ref >60)
Glucose, Bld: 136 mg/dL — ABNORMAL HIGH (ref 70–99)
Potassium: 4.6 mmol/L (ref 3.5–5.1)
Sodium: 138 mmol/L (ref 135–145)

## 2019-12-08 LAB — URINALYSIS, MICROSCOPIC (REFLEX)

## 2019-12-08 LAB — CBC WITH DIFFERENTIAL/PLATELET
Abs Immature Granulocytes: 0.02 10*3/uL (ref 0.00–0.07)
Basophils Absolute: 0.1 10*3/uL (ref 0.0–0.1)
Basophils Relative: 1 %
Eosinophils Absolute: 1.3 10*3/uL — ABNORMAL HIGH (ref 0.0–0.5)
Eosinophils Relative: 18 %
HCT: 34.5 % — ABNORMAL LOW (ref 39.0–52.0)
Hemoglobin: 10.9 g/dL — ABNORMAL LOW (ref 13.0–17.0)
Immature Granulocytes: 0 %
Lymphocytes Relative: 26 %
Lymphs Abs: 1.9 10*3/uL (ref 0.7–4.0)
MCH: 27.9 pg (ref 26.0–34.0)
MCHC: 31.6 g/dL (ref 30.0–36.0)
MCV: 88.2 fL (ref 80.0–100.0)
Monocytes Absolute: 0.8 10*3/uL (ref 0.1–1.0)
Monocytes Relative: 11 %
Neutro Abs: 3.2 10*3/uL (ref 1.7–7.7)
Neutrophils Relative %: 44 %
Platelets: 163 10*3/uL (ref 150–400)
RBC: 3.91 MIL/uL — ABNORMAL LOW (ref 4.22–5.81)
RDW: 13.2 % (ref 11.5–15.5)
WBC: 7.3 10*3/uL (ref 4.0–10.5)
nRBC: 0 % (ref 0.0–0.2)

## 2019-12-08 LAB — URINALYSIS, ROUTINE W REFLEX MICROSCOPIC
Bilirubin Urine: NEGATIVE
Glucose, UA: NEGATIVE mg/dL
Hgb urine dipstick: NEGATIVE
Ketones, ur: NEGATIVE mg/dL
Nitrite: NEGATIVE
Protein, ur: NEGATIVE mg/dL
Specific Gravity, Urine: 1.02 (ref 1.005–1.030)
pH: 5.5 (ref 5.0–8.0)

## 2019-12-08 NOTE — ED Provider Notes (Signed)
[place] [facility] Provider Note: Georgena Spurling, MD, FACEP  CSN: [id number] MRN: [id number] ARRIVAL: 12/08/19 at 1614 ROOM: [facility]   HISTORY OF PRESENT ILLNESS  12/08/19 10:52 PM Frank [name] is a 79 y.o. male who is at the [employer] and otherwise out in the sun earlier today.  He subsequently went home and felt "blah, no vim and vigor".  His caretaker (he has a history of traumatic brain injury requiring surgery) checked his blood pressure and found to be 115/55.  His PCPs office was contacted and they advised he be seen in the ED.  While waiting to be seen here he was given oral hydration with electrolytes.  He now feels back to baseline.  He denies any pain.  He has chronic right ptosis and chronic edema of his legs which have not changed.  He has a chronic cough which has not been worked up but he is not currently on an ACE inhibitor.   Past Medical History:  Diagnosis Date  . Allergy    Rhinitis  . Anemia    NOS iron deficient and B12 deficient  . Arthritis   . Diabetes mellitus    Type 2  . GERD (gastroesophageal reflux disease)   . Hyperlipidemia   . Hypertension   . 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 OSA (obstructive sleep apnea)    cpap  . TBI (traumatic brain injury) [facility])     Past Surgical History:  Procedure Laterality Date  . APPENDECTOMY    . arm fracture Left 2011   with hardware  . BURR HOLE Bilateral 08/04/2017   Procedure: Haskell Flirt;  Surgeon: [name], MD;  Location: [place];  Service: Neurosurgery;  Laterality: Bilateral;  . COLONOSCOPY    . CRANIOTOMY Left 12/01/2017   Procedure: Trudee Kuster Holes CRANIOTOMY HEMATOMA EVACUATION SUBDURAL;  Surgeon: [name], MD;  Location: [place];  Service: Neurosurgery;  Laterality: Left;  . ESOPHAGOGASTRODUODENOSCOPY  2006   gastritis  . ROTATOR CUFF REPAIR    . SEPTOPLASTY  1959   Deviated Septum  . 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 Heart disease Mother   . Breast cancer Mother   . Stroke Father   . Allergies Father        Father and children  . Coronary artery disease Brother   . Diabetes Neg Hx   . Colon cancer Neg Hx     Social History   Tobacco Use  . Smoking status: Former Smoker    Packs/day: 1.00    Quit date: 07/14/1983    Years since quitting: 36.4  . Smokeless tobacco: Never Used  Substance Use Topics  . Alcohol use: Yes    Alcohol/week: 2.0 standard drinks    Types: 2 Standard drinks or equivalent per week    Comment: occassionally  . Drug use: No    Prior to Admission medications   Medication Sig Start Date End Date Taking? Authorizing Provider  amLODipine (NORVASC) 10 MG tablet TAKE 1 TABLET BY MOUTH  DAILY 07/20/19   Janith Lima, MD  atorvastatin (LIPITOR) 20 MG tablet TAKE ONE-HALF TABLET BY  MOUTH DAILY 07/20/19   Janith Lima, MD  azelastine (ASTELIN) 0.1 % nasal spray Place 1 spray into both nostrils 2 (two) times daily. Use in each nostril as directed 11/27/19   Janith Lima, MD  Carbonyl Iron 25 MG TABS Take 25 mg by mouth daily.    [provider]  Cholecalciferol (VITAMIN D3) 50 MCG (2000 UT) TABS Take 1 tablet by mouth once daily 09/05/19   Janith Lima, MD  doxazosin (CARDURA) 4 MG tablet TAKE ONE-HALF TABLET BY  MOUTH AT BEDTIME 07/20/19   Janith Lima, MD  FLUoxetine (PROZAC) 20 MG capsule TAKE 1 CAPSULE BY MOUTH  DAILY 10/27/19   Janith Lima, MD  fluticasone (FLONASE) 50 MCG/ACT nasal spray Place 1 spray into both nostrils daily as needed for allergies or rhinitis.    [provider]  glipiZIDE (GLUCOTROL XL) 10 MG 24 hr tablet TAKE 1 TABLET BY MOUTH  DAILY 10/27/19   Janith Lima, MD  levocetirizine (XYZAL) 5 MG tablet Take 1 tablet (5 mg total) by mouth every evening. 11/27/19   Janith Lima, MD  metFORMIN (GLUCOPHAGE) 1000 MG tablet TAKE 1 TABLET BY MOUTH  TWICE DAILY WITH A MEAL 05/18/19   Janith Lima, MD   oxybutynin (DITROPAN) 5 MG tablet TAKE 1 TABLET BY MOUTH  TWICE DAILY 10/27/19   Janith Lima, MD  pantoprazole (PROTONIX) 40 MG tablet TAKE 1 TABLET BY MOUTH  DAILY 05/18/19   Janith Lima, MD  telmisartan (MICARDIS) 80 MG tablet TAKE 1 TABLET BY MOUTH  DAILY 07/20/19   Janith Lima, MD  triamcinolone cream (KENALOG) 0.1 %  07/26/19   [provider]  Cholecalciferol (VITAMIN D3) 50 MCG (2000 UT) TABS Take 1 tablet by mouth once daily 06/05/19   Janith Lima, MD    Allergies Lisinopril, Penicillins, and Sulfamethoxazole   REVIEW OF SYSTEMS  Negative except as noted here or in the History of Present Illness.   PHYSICAL EXAMINATION  Initial Vital Signs Blood pressure (!) 149/73, pulse 83, temperature 98.9 F (37.2 C), temperature source Oral, resp. rate 16, height 5\' 9"  (1.753 m), weight 98.8 kg, SpO2 94 %.  Examination General: Well-developed, well-nourished male in no acute distress; appearance consistent with age of record HENT: normocephalic; well-healed remote surgical changes Eyes: pupils pinpoint; extraocular muscles intact; right ptosis Neck: supple Heart: regular rate and rhythm Lungs: clear to auscultation bilaterally Abdomen: soft; nondistended; nontender; small reducible, nontender umbilical hernia; bowel sounds present Extremities: No deformity; full range of motion; 2+ edema of lower legs Neurologic: Awake, alert; motor function intact in all extremities and symmetric; no facial droop Skin: Warm and dry Psychiatric: Normal mood and affect   RESULTS  Summary of this visit's results, reviewed and interpreted by myself:   EKG Interpretation  Date/Time:    Ventricular Rate:    PR Interval:    QRS Duration:   QT Interval:    QTC Calculation:   R Axis:     Text Interpretation:        Laboratory Studies: Results for orders placed or performed during the hospital encounter of 12/08/19 (from the past 24 hour(s))  Urinalysis, Routine w reflex  microscopic     Status: Abnormal   Collection Time: 12/08/19 10:50 PM  Result Value Ref Range   Color, Urine YELLOW YELLOW   APPearance CLEAR CLEAR   Specific Gravity, Urine 1.020 1.005 - 1.030   pH 5.5 5.0 - 8.0   Glucose, UA NEGATIVE NEGATIVE mg/dL   Hgb urine dipstick NEGATIVE NEGATIVE   Bilirubin Urine NEGATIVE NEGATIVE   Ketones, ur NEGATIVE NEGATIVE mg/dL   Protein, ur NEGATIVE NEGATIVE mg/dL   Nitrite NEGATIVE NEGATIVE   Leukocytes,Ua  TRACE (A) NEGATIVE  CBC with Differential/Platelet     Status: Abnormal   Collection Time: 12/08/19 10:50 PM  Result Value Ref Range   WBC 7.3 4.0 - 10.5 K/uL   RBC 3.91 (L) 4.22 - 5.81 MIL/uL   Hemoglobin 10.9 (L) 13.0 - 17.0 g/dL   HCT 34.5 (L) 39.0 - 52.0 %   MCV 88.2 80.0 - 100.0 fL   MCH 27.9 26.0 - 34.0 pg   MCHC 31.6 30.0 - 36.0 g/dL   RDW 13.2 11.5 - 15.5 %   Platelets 163 150 - 400 K/uL   nRBC 0.0 0.0 - 0.2 %   Neutrophils Relative % 44 %   Neutro Abs 3.2 1.7 - 7.7 K/uL   Lymphocytes Relative 26 %   Lymphs Abs 1.9 0.7 - 4.0 K/uL   Monocytes Relative 11 %   Monocytes Absolute 0.8 0.1 - 1.0 K/uL   Eosinophils Relative 18 %   Eosinophils Absolute 1.3 (H) 0.0 - 0.5 K/uL   Basophils Relative 1 %   Basophils Absolute 0.1 0.0 - 0.1 K/uL   Immature Granulocytes 0 %   Abs Immature Granulocytes 0.02 0.00 - 0.07 K/uL  Basic metabolic panel     Status: Abnormal   Collection Time: 12/08/19 10:50 PM  Result Value Ref Range   Sodium 138 135 - 145 mmol/L   Potassium 4.6 3.5 - 5.1 mmol/L   Chloride 102 98 - 111 mmol/L   CO2 25 22 - 32 mmol/L   Glucose, Bld 136 (H) 70 - 99 mg/dL   BUN 41 (H) 8 - 23 mg/dL   Creatinine, Ser 2.30 (H) 0.61 - 1.24 mg/dL   Calcium 8.9 8.9 - 10.3 mg/dL   GFR calc non Af Amer 26 (L) >60 mL/min   GFR calc Af Amer 30 (L) >60 mL/min   Anion gap 11 5 - 15  Urinalysis, Microscopic (reflex)     Status: Abnormal   Collection Time: 12/08/19 10:50 PM  Result Value Ref Range   RBC / HPF 0-5 0 - 5 RBC/hpf   WBC, UA  0-5 0 - 5 WBC/hpf   Bacteria, UA FEW (A) NONE SEEN   Squamous Epithelial / LPF 0-5 0 - 5   Granular Casts, UA PRESENT    Imaging Studies: DG Chest 2 View  Result Date: 12/08/2019 CLINICAL DATA:  Cough EXAM: CHEST - 2 VIEW COMPARISON:  11/21/2018, 03/18/2016 FINDINGS: Chronic blunting of right CP angle. No acute consolidation or pneumothorax. Stable cardiomediastinal silhouette. Chronic right fifth rib deformity and distal right clavicle deformity. Aortic atherosclerosis. IMPRESSION: No active cardiopulmonary disease. Electronically Signed   By: Donavan Foil M.D.   On: 12/08/2019 23:50    ED COURSE and MDM  Nursing notes, initial and subsequent vitals signs, including pulse oximetry, reviewed and interpreted by myself.  Vitals:   12/08/19 1639 12/08/19 1954 12/08/19 2206 12/09/19 0013  BP:  (!) 145/73 (!) 149/73 139/64  Pulse:  72 83 68  Resp:  16 16 16   Temp:      TempSrc:      SpO2:  94% 94% 95%  Weight: 98.8 kg     Height: 5\' 9"  (1.753 m)      Medications - No data to display  The patient's anemia is chronic and not acutely changed.  His renal insufficiency has worsened and this may be a consequence of dehydration while in the sun yesterday or may be the cause of his weakness.  He will need to follow-up with  his primary care physician.  I see no reason for acute admission at this time as he is currently asymptomatic.  PROCEDURES  Procedures   ED DIAGNOSES     ICD-10-CM   1. Generalized weakness  R53.1   2. AKI (acute kidney injury) ([place])  N17.9        Harol Shabazz, MD 12/09/19 0025

## 2019-12-08 NOTE — ED Triage Notes (Addendum)
Pt sts he had his bp checked by home health because he is feeling "Blah... No vim and vigor".  Sts it was 115/55.   Sts he started feeling "blah" after going to the [employer] today.

## 2019-12-08 NOTE — ED Notes (Signed)
Pt reports feeling unusually tired earlier today after eating lunch outside then [place]. Reported that his home health staff checked his BP and found it to be 115/55. Pt states that now he is feeling "so much better" and BP now 147/73. Pt is without complaint at this time. RN gave Gatorade as pt states he has not had any liquids since lunch time.

## 2019-12-08 NOTE — Telephone Encounter (Signed)
New message:    Pt called and states his BP was high and I called the triage nurse and transferred the patient. Please advise.

## 2019-12-11 ENCOUNTER — Encounter: Payer: Self-pay | Admitting: Internal Medicine

## 2019-12-12 ENCOUNTER — Encounter: Payer: Self-pay | Admitting: Internal Medicine

## 2019-12-12 ENCOUNTER — Ambulatory Visit (INDEPENDENT_AMBULATORY_CARE_PROVIDER_SITE_OTHER): Payer: Medicare Other | Admitting: Internal Medicine

## 2019-12-12 ENCOUNTER — Other Ambulatory Visit: Payer: Self-pay

## 2019-12-12 VITALS — BP 128/62 | HR 70 | Temp 98.3°F | Resp 16 | Ht 69.0 in | Wt 218.0 lb

## 2019-12-12 DIAGNOSIS — N1832 Chronic kidney disease, stage 3b: Secondary | ICD-10-CM

## 2019-12-12 DIAGNOSIS — I10 Essential (primary) hypertension: Secondary | ICD-10-CM | POA: Diagnosis not present

## 2019-12-12 DIAGNOSIS — E1142 Type 2 diabetes mellitus with diabetic polyneuropathy: Secondary | ICD-10-CM | POA: Diagnosis not present

## 2019-12-12 LAB — BASIC METABOLIC PANEL
BUN: 40 mg/dL — ABNORMAL HIGH (ref 6–23)
CO2: 28 mEq/L (ref 19–32)
Calcium: 8.9 mg/dL (ref 8.4–10.5)
Chloride: 101 mEq/L (ref 96–112)
Creatinine, Ser: 1.93 mg/dL — ABNORMAL HIGH (ref 0.40–1.50)
GFR: 33.56 mL/min — ABNORMAL LOW (ref >60.00)
Glucose, Bld: 198 mg/dL — ABNORMAL HIGH (ref 70–99)
Potassium: 4.6 mEq/L (ref 3.5–5.1)
Sodium: 137 mEq/L (ref 135–145)

## 2019-12-12 NOTE — Patient Instructions (Signed)
Chronic Kidney Disease, Adult Chronic kidney disease (CKD) occurs when the kidneys become damaged slowly over a long period of time. The kidneys are a pair of organs that do many important jobs in the body, including:  Removing waste and extra fluid from the blood to make urine.  Making hormones that maintain the amount of fluid in tissues and blood vessels.  Maintaining the right amount of fluids and chemicals in the body. A small amount of kidney damage may not cause problems, but a large amount of damage may make it hard or impossible for the kidneys to work the way they should. If steps are not taken to slow down kidney damage or to stop it from getting worse, the kidneys may stop working permanently (end-stage renal disease or ESRD). Most of the time, CKD does not go away, but it can often be controlled. People who have CKD are usually able to live normal lives. What are the causes? The most common causes of this condition are diabetes and high blood pressure (hypertension). Other causes include:  Heart and blood vessel (cardiovascular) disease.  Kidney diseases, such as: ? Glomerulonephritis. ? Interstitial nephritis. ? Polycystic kidney disease. ? Renal vascular disease.  Diseases that affect the immune system.  Genetic diseases.  Medicines that damage the kidneys, such as anti-inflammatory medicines.  Being around or being in contact with poisonous (toxic) substances.  A kidney or urinary infection that occurs again and again (recurs).  Vasculitis. This is swelling or inflammation of the blood vessels.  A problem with urine flow that may be caused by: ? Cancer. ? Having kidney stones more than one time. ? An enlarged prostate, in males. What increases the risk? You are more likely to develop this condition if you:  Are older than age 60.  Are male.  Are African-American, Hispanic, Asian, Pacific Islander, or American Indian.  Are a current or former smoker.   Are obese.  Have a family history of kidney disease or failure.  Often take medicines that are damaging to the kidneys. What are the signs or symptoms? Symptoms of this condition include:  Swelling (edema) of the face, legs, ankles, or feet.  Tiredness (lethargy) and having less energy.  Nausea or vomiting.  Confusion or trouble concentrating.  Problems with urination, such as: ? Painful or burning feeling during urination. ? Decreased urine production. ? Frequent urination, especially at night. ? Bloody urine.  Muscle twitches and cramps, especially in the legs.  Shortness of breath.  Weakness.  Loss of appetite.  Metallic taste in the mouth.  Trouble sleeping.  Dry, itchy skin.  A low blood count (anemia).  Pale lining of the eyelids and surface of the eye (conjunctiva). Symptoms develop slowly and may not be obvious until the kidney damage becomes severe. It is possible to have kidney disease for years without having any symptoms. How is this diagnosed? This condition may be diagnosed based on:  Blood tests.  Urine tests.  Imaging tests, such as an ultrasound or CT scan.  A test in which a sample of tissue is removed from the kidneys to be examined under a microscope (kidney biopsy). These test results will help your health care provider determine how serious the CKD is. How is this treated? There is no cure for most cases of this condition, but treatment usually relieves symptoms and prevents or slows the progression of the disease. Treatment may include:  Making diet changes, which may require you to avoid alcohol, salty foods (sodium),   and foods that are high in potassium, calcium, and protein.  Medicines: ? To lower blood pressure. ? To control blood glucose. ? To relieve anemia. ? To relieve swelling. ? To protect your bones. ? To improve the balance of electrolytes in your blood.  Removing toxic waste from the body through types of dialysis, if  the kidneys can no longer do their job (kidney failure).  Managing any other conditions that are causing your CKD or making it worse. Follow these instructions at home: Medicines  Take over-the-counter and prescription medicines only as told by your health care provider. The dose of some medicines that you take may need to be adjusted.  Do not take any new medicines unless approved by your health care provider. Many medicines can worsen your kidney damage.  Do not take any vitamin and mineral supplements unless approved by your health care provider. Many nutritional supplements can worsen your kidney damage. General instructions  Follow your prescribed diet as told by your health care provider.  Do not use any products that contain nicotine or tobacco, such as cigarettes and e-cigarettes. If you need help quitting, ask your health care provider.  Monitor and track your blood pressure at home. Report changes in your blood pressure as told by your health care provider.  If you are being treated for diabetes, monitor and track your blood sugar (blood glucose) levels as told by your health care provider.  Maintain a healthy weight. If you need help with this, ask your health care provider.  Start or continue an exercise plan. Exercise at least 30 minutes a day, 5 days a week.  Keep your immunizations up to date as told by your health care provider.  Keep all follow-up visits as told by your health care provider. This is important. Where to find more information  American Association of Kidney Patients: www.aakp.org  National Kidney Foundation: www.kidney.org  American Kidney Fund: www.akfinc.org  Life [employer] [facility] Program: www.lifeoptions.org and www.kidneyschool.org Contact a health care provider if:  Your symptoms get worse.  You develop new symptoms. Get help right away if:  You develop symptoms of ESRD, which include: ? Headaches. ? Numbness in the hands or  feet. ? Easy bruising. ? Frequent hiccups. ? Chest pain. ? Shortness of breath. ? Lack of menstruation, in women.  You have a fever.  You have decreased urine production.  You have pain or bleeding when you urinate. Summary  Chronic kidney disease (CKD) occurs when the kidneys become damaged slowly over a long period of time.  The most common causes of this condition are diabetes and high blood pressure (hypertension).  There is no cure for most cases of this condition, but treatment usually relieves symptoms and prevents or slows the progression of the disease. Treatment may include a combination of medicines and lifestyle changes. This information is not intended to replace advice given to you by your health care provider. Make sure you discuss any questions you have with your health care provider. Document Revised: 06/11/2017 Document Reviewed: 08/06/2016 Elsevier Patient Education  2020 Elsevier Inc.  

## 2019-12-12 NOTE — Progress Notes (Signed)
Subjective:  Patient ID: Frank [name], male    DOB: Nov 11, 1940  Age: 79 y.o. MRN: [id number]  CC: Hypertension and Diabetes  This visit occurred during the SARS-CoV-2 public health emergency.  Safety protocols were in place, including screening questions prior to the visit, additional usage of staff PPE, and extensive cleaning of exam room while observing appropriate contact time as indicated for disinfecting solutions.    HPI Frank [name] presents for f/up - He was recently seen in the ED with the CC of weakness, he admits that his intake of liquids have been very good recently.  Outpatient Medications Prior to Visit  Medication Sig Dispense Refill  . amLODipine (NORVASC) 10 MG tablet TAKE 1 TABLET BY MOUTH  DAILY 90 tablet 1  . atorvastatin (LIPITOR) 20 MG tablet TAKE ONE-HALF TABLET BY  MOUTH DAILY 45 tablet 1  . azelastine (ASTELIN) 0.1 % nasal spray Place 1 spray into both nostrils 2 (two) times daily. Use in each nostril as directed 90 mL 1  . Carbonyl Iron 25 MG TABS Take 25 mg by mouth daily.    . Cholecalciferol (VITAMIN D3) 50 MCG (2000 UT) TABS Take 1 tablet by mouth once daily 90 tablet 1  . doxazosin (CARDURA) 4 MG tablet TAKE ONE-HALF TABLET BY  MOUTH AT BEDTIME 45 tablet 1  . FLUoxetine (PROZAC) 20 MG capsule TAKE 1 CAPSULE BY MOUTH  DAILY 90 capsule 1  . fluticasone (FLONASE) 50 MCG/ACT nasal spray Place 1 spray into both nostrils daily as needed for allergies or rhinitis.    [name] glipiZIDE (GLUCOTROL XL) 10 MG 24 hr tablet TAKE 1 TABLET BY MOUTH  DAILY 90 tablet 1  . levocetirizine (XYZAL) 5 MG tablet Take 1 tablet (5 mg total) by mouth every evening. 90 tablet 1  . oxybutynin (DITROPAN) 5 MG tablet TAKE 1 TABLET BY MOUTH  TWICE DAILY 180 tablet 1  . pantoprazole (PROTONIX) 40 MG tablet TAKE 1 TABLET BY MOUTH  DAILY 90 tablet 3  . telmisartan (MICARDIS) 80 MG tablet TAKE 1 TABLET BY MOUTH  DAILY 90 tablet 1  . triamcinolone cream (KENALOG) 0.1 %     .  metFORMIN (GLUCOPHAGE) 1000 MG tablet TAKE 1 TABLET BY MOUTH  TWICE DAILY WITH A MEAL 180 tablet 3   No facility-administered medications prior to visit.    ROS Review of Systems  Constitutional: Positive for appetite change. Negative for chills, diaphoresis, fatigue and fever.  HENT: Negative.   Eyes: Negative.   Respiratory: Negative for cough, chest tightness, shortness of breath and wheezing.   Cardiovascular: Negative for chest pain, palpitations and leg swelling.  Gastrointestinal: Negative for abdominal pain, diarrhea, nausea and vomiting.  Endocrine: Negative.  Negative for polydipsia, polyphagia and polyuria.  Genitourinary: Negative.  Negative for difficulty urinating, dysuria, hematuria and urgency.  Musculoskeletal: Negative.  Negative for arthralgias and myalgias.  Skin: Negative for color change and pallor.  Neurological: Positive for weakness. Negative for dizziness, seizures, light-headedness and numbness.  Hematological: Negative for adenopathy. Does not bruise/bleed easily.  Psychiatric/Behavioral: Negative.     Objective:  BP 128/62 (BP Location: Left Arm, Patient Position: Sitting, Cuff Size: Large)   Pulse 70   Temp 98.3 F (36.8 C) (Oral)   Resp 16   Ht 5\' 9"  (1.753 m)   Wt 218 lb (98.9 kg)   SpO2 96%   BMI 32.19 kg/m   BP Readings from Last 3 Encounters:  12/12/19 128/62  12/09/19 139/64  11/28/19 (!) 136/58  Wt Readings from Last 3 Encounters:  12/12/19 218 lb (98.9 kg)  12/08/19 217 lb 14.4 oz (98.8 kg)  11/28/19 218 lb (98.9 kg)    Physical Exam Vitals reviewed.  Constitutional:      Appearance: Normal appearance.  HENT:     Nose: Nose normal.     Mouth/Throat:     Mouth: Mucous membranes are moist.  Eyes:     General: No scleral icterus.    Conjunctiva/sclera: Conjunctivae normal.  Cardiovascular:     Rate and Rhythm: Normal rate and regular rhythm.     Heart sounds: No murmur.  Pulmonary:     Effort: Pulmonary effort is  normal.     Breath sounds: No stridor. No wheezing, rhonchi or rales.  Abdominal:     General: Abdomen is flat. Bowel sounds are normal. There is no distension.     Palpations: Abdomen is soft. There is no hepatomegaly, splenomegaly or mass.     Tenderness: There is no abdominal tenderness.  Musculoskeletal:        General: Normal range of motion.     Cervical back: Neck supple.     Right lower leg: No edema.     Left lower leg: No edema.  Lymphadenopathy:     Cervical: No cervical adenopathy.  Skin:    General: Skin is warm and dry.     Coloration: Skin is not pale.  Neurological:     General: No focal deficit present.     Mental Status: He is alert.     Lab Results  Component Value Date   WBC 7.3 12/08/2019   HGB 10.9 (L) 12/08/2019   HCT 34.5 (L) 12/08/2019   PLT 163 12/08/2019   GLUCOSE 198 (H) 12/12/2019   CHOL 130 04/20/2019   TRIG 89.0 04/20/2019   HDL 46.10 04/20/2019   LDLCALC 66 04/20/2019   ALT 19 08/04/2017   AST 20 08/04/2017   NA 137 12/12/2019   K 4.6 12/12/2019   CL 101 12/12/2019   CREATININE 1.93 (H) 12/12/2019   BUN 40 (H) 12/12/2019   CO2 28 12/12/2019   TSH 2.02 05/25/2018   PSA 1.08 07/10/2015   INR 0.98 08/04/2017   HGBA1C 6.9 (H) 11/27/2019   MICROALBUR 10.9 (H) 11/27/2019    DG Chest 2 View  Result Date: 12/08/2019 CLINICAL DATA:  Cough EXAM: CHEST - 2 VIEW COMPARISON:  11/21/2018, 03/18/2016 FINDINGS: Chronic blunting of right CP angle. No acute consolidation or pneumothorax. Stable cardiomediastinal silhouette. Chronic right fifth rib deformity and distal right clavicle deformity. Aortic atherosclerosis. IMPRESSION: No active cardiopulmonary disease. Electronically Signed   By: Donavan Foil M.D.   On: 12/08/2019 23:50    Assessment & Plan:   [name] was seen today for hypertension and diabetes.  Diagnoses and all orders for this visit:  Essential hypertension- His blood pressure is adequately well controlled. -     Basic  metabolic panel; Future -     Basic metabolic panel  Type 2 diabetes mellitus with peripheral neuropathy ([place])- He has adequate glycemic control but complains of decreased oral intake and has had a slight decrease in his renal function.  I recommended that he stop taking Metformin. -     Basic metabolic panel; Future -     Basic metabolic panel  Stage 3b chronic kidney disease- His GFR is down to 35.  I recommended that he stop taking Metformin to see if this will help with his oral intake. -  Basic metabolic panel; Future -     Basic metabolic panel   I have discontinued [name] Brem "ken"'s metFORMIN. I am also having him maintain his Carbonyl Iron, fluticasone, pantoprazole, telmisartan, atorvastatin, amLODipine, doxazosin, triamcinolone cream, Vitamin D3, oxybutynin, glipiZIDE, FLUoxetine, azelastine, and levocetirizine.  No orders of the defined types were placed in this encounter.    Follow-up: Return in about 6 weeks (around 01/23/2020).  Scarlette Calico, MD

## 2019-12-18 ENCOUNTER — Other Ambulatory Visit: Payer: Self-pay | Admitting: Internal Medicine

## 2019-12-18 ENCOUNTER — Encounter: Payer: Self-pay | Admitting: Internal Medicine

## 2019-12-18 DIAGNOSIS — W57XXXA Bitten or stung by nonvenomous insect and other nonvenomous arthropods, initial encounter: Secondary | ICD-10-CM | POA: Insufficient documentation

## 2019-12-18 DIAGNOSIS — S30861A Insect bite (nonvenomous) of abdominal wall, initial encounter: Secondary | ICD-10-CM | POA: Insufficient documentation

## 2019-12-18 MED ORDER — DOXYCYCLINE HYCLATE 100 MG PO TABS: 100.0000 mg | ORAL_TABLET | Freq: Two times a day (BID) | ORAL | 0 refills | Status: AC

## 2019-12-22 ENCOUNTER — Ambulatory Visit: Payer: Medicare Other | Admitting: Pharmacist

## 2019-12-22 ENCOUNTER — Other Ambulatory Visit: Payer: Self-pay

## 2019-12-22 DIAGNOSIS — K219 Gastro-esophageal reflux disease without esophagitis: Secondary | ICD-10-CM

## 2019-12-22 DIAGNOSIS — E1142 Type 2 diabetes mellitus with diabetic polyneuropathy: Secondary | ICD-10-CM

## 2019-12-22 DIAGNOSIS — I10 Essential (primary) hypertension: Secondary | ICD-10-CM

## 2019-12-22 DIAGNOSIS — E785 Hyperlipidemia, unspecified: Secondary | ICD-10-CM

## 2019-12-22 NOTE — Addendum Note (Signed)
Addended by: Aviva Signs M on: 12/22/2019 11:41 AM   Modules accepted: Orders

## 2019-12-22 NOTE — Patient Instructions (Addendum)
Visit Information  Phone number for Pharmacist: [phone]  Thank you for meeting with me to discuss your medications! I look forward to working with you to achieve your health care goals. Below is a summary of what we talked about during the visit:  Goals Addressed            This Visit's Progress   . Pharmacy Care Plan       CARE PLAN ENTRY  Current Barriers:  . Chronic Disease Management support, education, and care coordination needs related to Hypertension, Hyperlipidemia, Diabetes, and GERD   Hypertension BP Readings from Last 3 Encounters:  12/12/19 128/62  12/09/19 139/64  11/28/19 (!) 136/58 .  Pharmacist Clinical Goal(s): o Over the next 90 days, patient will work with PharmD and providers to maintain BP goal <130/80 . Current regimen:  o Amlodipine 10 mg daily o Doxazosin 4 mg - 1/2 tablet at bedtime o Telmisartan 80 mg daily . Interventions: o Discussed benefits of taking BP medications at night . Patient self care activities - Over the next 90 days, patient will: o Check BP as needed, document, and provide at future appointments o Ensure daily salt intake < 2300 mg/day o Move blood pressure medications to bedtime  Hyperlipidemia Lab Results  Component Value Date/Time   LDLCALC 66 04/20/2019 11:02 AM .  Pharmacist Clinical Goal(s): o Over the next 90 days, patient will work with PharmD and providers to maintain LDL goal < 70 . Current regimen:  o Atorvastatin 80 mg daily . Interventions: o Discussed cholesterol goals and benefits of atorvastatin for prevention of heart attack and stroke . Patient self care activities - Over the next 90 days, patient will: o Continue medication as prescribed o Continue low cholesterol diet  Diabetes Lab Results  Component Value Date/Time   HGBA1C 6.9 (H) 11/27/2019 10:38 AM   HGBA1C 7.0 (H) 08/21/2019 10:54 AM .  Pharmacist Clinical Goal(s): o Over the next 90 days, patient will work with PharmD and providers to  maintain A1c goal <7% . Current regimen:  o Glipizide XL 5 mg daily . Interventions: o Discussed A1c goal and benefits of good blood sugar control . Patient self care activities - Over the next 90 days, patient will: o Contact provider with any episodes of hypoglycemia (low blood sugar) o Continue medication as prescribed  GERD / Heartburn . Pharmacist Clinical Goal(s) o Over the next 90 days, patient will work with PharmD and providers to determine necessity of medications . Current regimen:  o Pantoprazole 40 mg daily . Interventions: o Discussed opportunity to try time off of pantoprazole to see if heartburn is still an issue . Patient self care activities - Over the next 90 days, patient will: o Trial off of pantoprazole o If heartburn returns, may start back on pantoprazole at any time  Medication management . Pharmacist Clinical Goal(s): o Over the next 90 days, patient will work with PharmD and providers to maintain optimal medication adherence . Current pharmacy: [facility] mail order . Interventions o Comprehensive medication review performed. o Continue current medication management strategy . Patient self care activities - Over the next 90 days, patient will: o Focus on medication adherence by pill box o Take medications as prescribed o Report any questions or concerns to PharmD and/or provider(s)  Initial goal documentation       Frank [name] was given information about Chronic Care Management services today including:  1. CCM service includes personalized support from designated clinical staff supervised by his  physician, including individualized plan of care and coordination with other care providers 2. 24/7 contact phone numbers for assistance for urgent and routine care needs. 3. Standard insurance, coinsurance, copays and deductibles apply for chronic care management only during months in which we provide at least 20 minutes of these services. Most insurances  cover these services at 100%, however patients may be responsible for any copay, coinsurance and/or deductible if applicable. This service may help you avoid the need for more expensive face-to-face services. 4. Only one practitioner may furnish and bill the service in a calendar month. 5. The patient may stop CCM services at any time (effective at the end of the month) by phone call to the office staff.  Patient agreed to services and verbal consent obtained.   The patient verbalized understanding of instructions provided today and agreed to receive a mailed copy of patient instruction and/or educational materials. Telephone follow up appointment with pharmacy team member scheduled for: 3 months  Charlene Brooke, PharmD Clinical Pharmacist [facility] [phone]   Heart-Healthy Eating Plan Many factors influence your heart (coronary) health, including eating and exercise habits. Coronary risk increases with abnormal blood fat (lipid) levels. Heart-healthy meal planning includes limiting unhealthy fats, increasing healthy fats, and making other diet and lifestyle changes. What are tips for following this plan? Cooking Cook foods using methods other than frying. Baking, boiling, grilling, and broiling are all good options. Other ways to reduce fat include:  Removing the skin from poultry.  Removing all visible fats from meats.  Steaming vegetables in water or broth. Meal planning   At meals, imagine dividing your plate into fourths: ? Fill one-half of your plate with vegetables and green salads. ? Fill one-fourth of your plate with whole grains. ? Fill one-fourth of your plate with lean protein foods.  Eat 4-5 servings of vegetables per day. One serving equals 1 cup raw or cooked vegetable, or 2 cups raw leafy greens.  Eat 4-5 servings of fruit per day. One serving equals 1 medium whole fruit,  cup dried fruit,  cup fresh, frozen, or canned fruit, or   cup 100% fruit juice.  Eat more foods that contain soluble fiber. Examples include apples, broccoli, carrots, beans, peas, and barley. Aim to get 25-30 g of fiber per day.  Increase your consumption of legumes, nuts, and seeds to 4-5 servings per week. One serving of dried beans or legumes equals  cup cooked, 1 serving of nuts is  cup, and 1 serving of seeds equals 1 tablespoon. Fats  Choose healthy fats more often. Choose monounsaturated and polyunsaturated fats, such as olive and canola oils, flaxseeds, walnuts, almonds, and seeds.  Eat more omega-3 fats. Choose salmon, mackerel, sardines, tuna, flaxseed oil, and ground flaxseeds. Aim to eat fish at least 2 times each week.  Check food labels carefully to identify foods with trans fats or high amounts of saturated fat.  Limit saturated fats. These are found in animal products, such as meats, butter, and cream. Plant sources of saturated fats include palm oil, palm kernel oil, and coconut oil.  Avoid foods with partially hydrogenated oils in them. These contain trans fats. Examples are stick margarine, some tub margarines, cookies, crackers, and other baked goods.  Avoid fried foods. General information  Eat more home-cooked food and less restaurant, buffet, and fast food.  Limit or avoid alcohol.  Limit foods that are high in starch and sugar.  Lose weight if you are overweight. Losing just  5-10% of your body weight can help your overall health and prevent diseases such as diabetes and heart disease.  Monitor your salt (sodium) intake, especially if you have high blood pressure. Talk with your health care provider about your sodium intake.  Try to incorporate more vegetarian meals weekly. What foods can I eat? Fruits All fresh, canned (in natural juice), or frozen fruits. Vegetables Fresh or frozen vegetables (raw, steamed, roasted, or grilled). Green salads. Grains Most grains. Choose whole wheat and whole grains most of  the time. Rice and pasta, including brown rice and pastas made with whole wheat. Meats and other proteins Lean, well-trimmed beef, veal, pork, and lamb. Chicken and [place] without skin. All fish and shellfish. Wild duck, rabbit, pheasant, and venison. Egg whites or low-cholesterol egg substitutes. Dried beans, peas, lentils, and tofu. Seeds and most nuts. Dairy Low-fat or nonfat cheeses, including ricotta and mozzarella. Skim or 1% milk (liquid, powdered, or evaporated). Buttermilk made with low-fat milk. Nonfat or low-fat yogurt. Fats and oils Non-hydrogenated (trans-free) margarines. Vegetable oils, including soybean, sesame, sunflower, olive, peanut, safflower, corn, canola, and cottonseed. Salad dressings or mayonnaise made with a vegetable oil. Beverages Water (mineral or sparkling). Coffee and tea. Diet carbonated beverages. Sweets and desserts Sherbet, gelatin, and fruit ice. Small amounts of dark chocolate. Limit all sweets and desserts. Seasonings and condiments All seasonings and condiments. The items listed above may not be a complete list of foods and beverages you can eat. Contact a dietitian for more options. What foods are not recommended? Fruits Canned fruit in heavy syrup. Fruit in cream or butter sauce. Fried fruit. Limit coconut. Vegetables Vegetables cooked in cheese, cream, or butter sauce. Fried vegetables. Grains Breads made with saturated or trans fats, oils, or whole milk. Croissants. Sweet rolls. Donuts. High-fat crackers, such as cheese crackers. Meats and other proteins Fatty meats, such as hot dogs, ribs, sausage, bacon, rib-eye roast or steak. High-fat deli meats, such as salami and bologna. Caviar. Domestic duck and goose. Organ meats, such as liver. Dairy Cream, sour cream, cream cheese, and creamed cottage cheese. Whole milk cheeses. Whole or 2% milk (liquid, evaporated, or condensed). Whole buttermilk. Cream sauce or high-fat cheese sauce. Whole-milk  yogurt. Fats and oils Meat fat, or shortening. Cocoa butter, hydrogenated oils, palm oil, coconut oil, palm kernel oil. Solid fats and shortenings, including bacon fat, salt pork, lard, and butter. Nondairy cream substitutes. Salad dressings with cheese or sour cream. Beverages Regular sodas and any drinks with added sugar. Sweets and desserts Frosting. Pudding. Cookies. Cakes. Pies. Milk chocolate or white chocolate. Buttered syrups. Full-fat ice cream or ice cream drinks. The items listed above may not be a complete list of foods and beverages to avoid. Contact a dietitian for more information. Summary  Heart-healthy meal planning includes limiting unhealthy fats, increasing healthy fats, and making other diet and lifestyle changes.  Lose weight if you are overweight. Losing just 5-10% of your body weight can help your overall health and prevent diseases such as diabetes and heart disease.  Focus on eating a balance of foods, including fruits and vegetables, low-fat or nonfat dairy, lean protein, nuts and legumes, whole grains, and heart-healthy oils and fats. This information is not intended to replace advice given to you by your health care provider. Make sure you discuss any questions you have with your health care provider. Document Revised: 08/06/2017 Document Reviewed: 08/06/2017 Elsevier Patient Education  2020 [employer].

## 2019-12-22 NOTE — Chronic Care Management (AMB) (Signed)
Chronic Care Management Pharmacy  Name: Frank [name]  MRN: [id number] DOB: 1941/04/13   Chief Complaint/ HPI  Frank [name],  79 y.o. , male presents for their Initial CCM visit with the clinical pharmacist via telephone due to COVID-19 Pandemic.  PCP : Janith Lima, MD  Their chronic conditions include: Hypertension, Hyperlipidemia, Diabetes, GERD, Chronic Kidney Disease, Depression, Overactive Bladder, BPH and Dementia, Allergies   Born and raised in [place], lived in [place] since 1966. Manufacturers representative for work. Married in 1963. Reads Pharmacist Daily.   Office Visits: 12/12/19 Dr Ronnald Ramp OV: ED f/u for weakness. Recommend to stop metformin d/t renal function.  11/27/19 Dr Ronnald Ramp OV: gradual memory decline, chronic postnasal drip. Added nasal antihistamine.  Consult Visit: 12/08/19 ED visit: general weakness after being in sun at [employer]. Slight AKI d/t dehydration.  11/01/19 Dr Paulla Dolly (podiatry): DM foot exam.  Allergies  Allergen Reactions  . Lisinopril Cough  . Penicillins     REACTION: unspecified  Allergy as a child.  Patient does not remember reaction.  Has had cephalasporins without problems.  . Sulfamethoxazole     REACTION: as child    Medications: Outpatient Encounter Medications as of 12/22/2019  Medication Sig  . amLODipine (NORVASC) 10 MG tablet TAKE 1 TABLET BY MOUTH  DAILY  . atorvastatin (LIPITOR) 20 MG tablet TAKE ONE-HALF TABLET BY  MOUTH DAILY  . Cholecalciferol (VITAMIN D3) 50 MCG (2000 UT) TABS Take 1 tablet by mouth once daily  . doxazosin (CARDURA) 4 MG tablet TAKE ONE-HALF TABLET BY  MOUTH AT BEDTIME  . FLUoxetine (PROZAC) 20 MG capsule TAKE 1 CAPSULE BY MOUTH  DAILY  . fluticasone (FLONASE) 50 MCG/ACT nasal spray Place 1 spray into both nostrils daily as needed for allergies or rhinitis.  [name] glipiZIDE (GLUCOTROL XL) 10 MG 24 hr tablet TAKE 1 TABLET BY MOUTH  DAILY  . levocetirizine (XYZAL) 5 MG tablet Take 1  tablet (5 mg total) by mouth every evening.  [name] oxybutynin (DITROPAN) 5 MG tablet TAKE 1 TABLET BY MOUTH  TWICE DAILY  . pantoprazole (PROTONIX) 40 MG tablet TAKE 1 TABLET BY MOUTH  DAILY  . telmisartan (MICARDIS) 80 MG tablet TAKE 1 TABLET BY MOUTH  DAILY  . triamcinolone cream (KENALOG) 0.1 %   . azelastine (ASTELIN) 0.1 % nasal spray Place 1 spray into both nostrils 2 (two) times daily. Use in each nostril as directed (Patient not taking: Reported on 12/22/2019)  . Carbonyl Iron 25 MG TABS Take 25 mg by mouth daily.  [name] doxycycline (VIBRA-TABS) 100 MG tablet Take 1 tablet (100 mg total) by mouth 2 (two) times daily for 7 days. (Patient not taking: Reported on 12/22/2019)  . [DISCONTINUED] Cholecalciferol (VITAMIN D3) 50 MCG (2000 UT) TABS Take 1 tablet by mouth once daily   No facility-administered encounter medications on file as of 12/22/2019.     Current Diagnosis/Assessment:  SDOH Interventions     Most Recent Value  SDOH Interventions  Financial Strain Interventions Intervention Not Indicated      Goals Addressed            This Visit's Progress   . Pharmacy Care Plan       CARE PLAN ENTRY  Current Barriers:  . Chronic Disease Management support, education, and care coordination needs related to Hypertension, Hyperlipidemia, Diabetes, and GERD   Hypertension BP Readings from Last 3 Encounters:  12/12/19 128/62  12/09/19 139/64  11/28/19 (!) 136/58 .  Pharmacist Clinical Goal(s): o  Over the next 90 days, patient will work with PharmD and providers to maintain BP goal <130/80 . Current regimen:  o Amlodipine 10 mg daily o Doxazosin 4 mg - 1/2 tablet at bedtime o Telmisartan 80 mg daily . Interventions: o Discussed benefits of taking BP medications at night . Patient self care activities - Over the next 90 days, patient will: o Check BP as needed, document, and provide at future appointments o Ensure daily salt intake < 2300 mg/day o Move blood pressure medications  to bedtime  Hyperlipidemia Lab Results  Component Value Date/Time   LDLCALC 66 04/20/2019 11:02 AM .  Pharmacist Clinical Goal(s): o Over the next 90 days, patient will work with PharmD and providers to maintain LDL goal < 70 . Current regimen:  o Atorvastatin 80 mg daily . Interventions: o Discussed cholesterol goals and benefits of atorvastatin for prevention of heart attack and stroke . Patient self care activities - Over the next 90 days, patient will: o Continue medication as prescribed o Continue low cholesterol diet  Diabetes Lab Results  Component Value Date/Time   HGBA1C 6.9 (H) 11/27/2019 10:38 AM   HGBA1C 7.0 (H) 08/21/2019 10:54 AM .  Pharmacist Clinical Goal(s): o Over the next 90 days, patient will work with PharmD and providers to maintain A1c goal <7% . Current regimen:  o Glipizide XL 5 mg daily . Interventions: o Discussed A1c goal and benefits of good blood sugar control . Patient self care activities - Over the next 90 days, patient will: o Contact provider with any episodes of hypoglycemia (low blood sugar) o Continue medication as prescribed  GERD / Heartburn . Pharmacist Clinical Goal(s) o Over the next 90 days, patient will work with PharmD and providers to determine necessity of medications . Current regimen:  o Pantoprazole 40 mg daily . Interventions: o Discussed opportunity to try time off of pantoprazole to see if heartburn is still an issue . Patient self care activities - Over the next 90 days, patient will: o Trial off of pantoprazole o If heartburn returns, may start back on pantoprazole at any time  Medication management . Pharmacist Clinical Goal(s): o Over the next 90 days, patient will work with PharmD and providers to maintain optimal medication adherence . Current pharmacy: [facility] mail order . Interventions o Comprehensive medication review performed. o Continue current medication management strategy . Patient self care  activities - Over the next 90 days, patient will: o Focus on medication adherence by pill box o Take medications as prescribed o Report any questions or concerns to PharmD and/or provider(s)  Initial goal documentation       Hypertension   BP goal is:  <130/80  Office blood pressures are  BP Readings from Last 3 Encounters:  12/12/19 128/62  12/09/19 139/64  11/28/19 (!) 136/58   Kidney Function Lab Results  Component Value Date/Time   CREATININE 1.93 (H) 12/12/2019 02:50 PM   CREATININE 2.30 (H) 12/08/2019 10:50 PM   GFR 33.56 (L) 12/12/2019 02:50 PM   GFRNONAA 26 (L) 12/08/2019 10:50 PM   GFRAA 30 (L) 12/08/2019 10:50 PM   Patient checks BP at home 1-2x per week Patient home BP readings are ranging: 120s-130s/60s-70s  Patient has failed these meds in the past: n/a Patient is currently controlled on the following medications:  . Amlodipine 10 mg daily . Doxazosin 4 mg - 1/2 tablet HS . Telmisartan 80 mg daily  We discussed diet and exercise extensively; importance of maintaining hydration to prevent  hypotension - pt drinks 32-40 oz of fluid per day. Drinks coffee in AM, then water, gatorade throughout the day.   Plan  Continue current medications and control with diet and exercise   Hyperlipidemia   LDL goal < 70  Lipid Panel     Component Value Date/Time   CHOL 130 04/20/2019 1102   TRIG 89.0 04/20/2019 1102   HDL 46.10 04/20/2019 1102   LDLCALC 66 04/20/2019 1102    The ASCVD Risk score (Goff DC Jr., et al., 2013) failed to calculate for the following reasons:   The 2013 ASCVD risk score is only valid for ages 34 to 61   Patient has failed these meds in past: n/a Patient is currently controlled on the following medications:  . Atorvastatin 80 mg daily  We discussed:  diet and exercise extensively; cholesterol goals; benefits of statin for ASCVD prevention  Plan  Continue current medications and control with diet and exercise  Diabetes   A1c  goal < 7%  Recent Relevant Labs: Lab Results  Component Value Date/Time   HGBA1C 6.9 (H) 11/27/2019 10:38 AM   HGBA1C 7.0 (H) 08/21/2019 10:54 AM   GFR 33.56 (L) 12/12/2019 02:50 PM   GFR 34.39 (L) 11/27/2019 10:38 AM   MICROALBUR 10.9 (H) 11/27/2019 10:38 AM   MICROALBUR 7.9 (H) 11/21/2018 12:12 PM    Last diabetic Eye exam:  Lab Results  Component Value Date/Time   HMDIABEYEEXA No Retinopathy 10/10/2019 12:00 AM    Last diabetic Foot exam:  Lab Results  Component Value Date/Time   HMDIABFOOTEX done-podiatry 11/01/2013 12:00 AM    Checking BG: Never  Patient has failed these meds in past: Invokamet, Januvia, metformin, Tradjenta, pioglitazone Patient is currently controlled on the following medications: [name] Glipizide XL 10 mg daily  We discussed: diet and exercise extensively; A1c goal and ability to monitor DM control via quarterly A1c checks rather than SMBG.   Plan  Continue current medications and control with diet and exercise   Depression   Depression screen [facility] 2/9 11/27/2019 11/08/2019 04/20/2019  Decreased Interest 0 0 0  Down, Depressed, Hopeless 0 0 0  PHQ - 2 Score 0 0 0  Altered sleeping 0 - 0  Tired, decreased energy 0 - 0  Change in appetite 0 - 0  Feeling bad or failure about yourself  0 - 0  Trouble concentrating 0 - 0  Moving slowly or fidgety/restless 0 - 0  Suicidal thoughts 0 - 0  PHQ-9 Score 0 - 0  Difficult doing work/chores Not difficult at all - Not difficult at all  Some recent data might be hidden   Patient has failed these meds in past: n/a Patient is currently controlled on the following medications:  . Fluoxetine 20 mg daily  We discussed:  Pt denies issues with mood, doing well with current dose.  Plan  Continue current medications  GERD   Patient has failed these meds in past: n/a Patient is currently controlled on the following medications:  . Pantoprazole 40 mg daily  We discussed: Pt denies issues with heartburn.  Discussed opportunity for d/c unnecessary medications; pt agreed to trial off PPI.  Plan  Recommend trial off pantoprazole   Allergies   Patient has failed these meds in past: n/a Patient is currently controlled on the following medications:  . Flonase nasal spray PRN . Azelastine nasal spray PRN . Levocetirizine 5 mg HS  We discussed:  Azelastine spray was $100 at pharmacy (due to deductible + $  47 copay), pt prefers to switch back to Fluticasone for cost savings.  Plan  Continue current medications  BPH/OAB   Patient has failed these meds in past: n/a Patient is currently controlled on the following medications:  . Doxazosin 4 mg - 1/2 tab HS . Oxybutynin 5 mg BID  We discussed:  Purpose and benefits of medications  Plan  Continue current medications  B12 Deficiency Anemia   Lab Results  Component Value Date/Time   VITAMINB12 1,104 (H) 11/30/2018 02:44 PM   CBC Latest Ref Rng & Units 12/08/2019 11/27/2019 08/21/2019  WBC 4.0 - 10.5 K/uL 7.3 8.7 6.5  Hemoglobin 13.0 - 17.0 g/dL 10.9(L) 10.4(L) 10.4(L)  Hematocrit 39 - 52 % 34.5(L) 31.6(L) 31.9(L)  Platelets 150 - 400 K/uL 163 168.0 144.0(L)   Patient has failed these meds in past: n/a Patient is currently controlled on the following medications:  . Cyanocobalamin 1000 mg injection  We discussed:  Pt has not noticed an improvement in energy since starting B12 injections; discussed benefits for anemia.  Plan  Continue current medications  Health Maintenance   CBC Latest Ref Rng & Units 12/08/2019 11/27/2019 08/21/2019  WBC 4.0 - 10.5 K/uL 7.3 8.7 6.5  Hemoglobin 13.0 - 17.0 g/dL 10.9(L) 10.4(L) 10.4(L)  Hematocrit 39 - 52 % 34.5(L) 31.6(L) 31.9(L)  Platelets 150 - 400 K/uL 163 168.0 144.0(L)   Iron/TIBC/Ferritin/ %Sat    Component Value Date/Time   IRON 65 04/20/2019 1102   FERRITIN 48.8 04/20/2019 1102   IRONPCTSAT 21.2 04/20/2019 1102   Patient is currently controlled on the following medications:   . Carbonyl iron 25 mg  . Vitamin D3 2000 IU daily  We discussed:  Benefits of iron and vitamin D supplement for energy, immune support, bone health.  Plan  Continue current medications   Medication Management   Pt uses [place] for all medications Uses pill box? Yes Pt endorses 100% compliance  We discussed: All medications are $0 copay through mail order.   Plan  Continue current medication management strategy    Follow up: 3 month phone visit  Charlene Brooke, PharmD Clinical Pharmacist [facility] [phone]

## 2019-12-23 ENCOUNTER — Other Ambulatory Visit: Payer: Self-pay | Admitting: Internal Medicine

## 2019-12-23 DIAGNOSIS — E785 Hyperlipidemia, unspecified: Secondary | ICD-10-CM

## 2019-12-23 DIAGNOSIS — I10 Essential (primary) hypertension: Secondary | ICD-10-CM

## 2019-12-23 DIAGNOSIS — E118 Type 2 diabetes mellitus with unspecified complications: Secondary | ICD-10-CM

## 2019-12-23 DIAGNOSIS — E1142 Type 2 diabetes mellitus with diabetic polyneuropathy: Secondary | ICD-10-CM

## 2019-12-23 DIAGNOSIS — N401 Enlarged prostate with lower urinary tract symptoms: Secondary | ICD-10-CM

## 2019-12-25 ENCOUNTER — Ambulatory Visit (INDEPENDENT_AMBULATORY_CARE_PROVIDER_SITE_OTHER): Payer: Medicare Other | Admitting: *Deleted

## 2019-12-25 ENCOUNTER — Other Ambulatory Visit: Payer: Self-pay

## 2019-12-25 DIAGNOSIS — E538 Deficiency of other specified B group vitamins: Secondary | ICD-10-CM

## 2019-12-25 MED ORDER — CYANOCOBALAMIN 1000 MCG/ML IJ SOLN
1000.0000 ug | Freq: Once | INTRAMUSCULAR | Status: AC
  Administered 2019-12-25: 1000 ug via INTRAMUSCULAR

## 2019-12-25 NOTE — Progress Notes (Signed)
Pls cosign for B12 inj../lmb  

## 2019-12-27 ENCOUNTER — Encounter: Payer: Self-pay | Admitting: Internal Medicine

## 2019-12-27 DIAGNOSIS — G4733 Obstructive sleep apnea (adult) (pediatric): Secondary | ICD-10-CM | POA: Diagnosis not present

## 2019-12-28 ENCOUNTER — Encounter: Payer: Self-pay | Admitting: Internal Medicine

## 2019-12-31 ENCOUNTER — Other Ambulatory Visit: Payer: Self-pay | Admitting: Internal Medicine

## 2019-12-31 DIAGNOSIS — N1832 Chronic kidney disease, stage 3b: Secondary | ICD-10-CM

## 2020-01-01 ENCOUNTER — Encounter: Payer: Self-pay | Admitting: Internal Medicine

## 2020-01-02 ENCOUNTER — Other Ambulatory Visit: Payer: Self-pay | Admitting: Internal Medicine

## 2020-01-18 ENCOUNTER — Telehealth: Payer: Self-pay | Admitting: Internal Medicine

## 2020-01-18 ENCOUNTER — Other Ambulatory Visit: Payer: Self-pay | Admitting: Internal Medicine

## 2020-01-18 NOTE — Telephone Encounter (Signed)
New message:   After reviewing referral Dr. Moshe Cipro feels like the patient should stop taking ARB. They have the pt scheduled for Aug 4. Please advise.

## 2020-01-18 NOTE — Telephone Encounter (Signed)
Pt contacted and informed that PCP stopped the telmisartan per Nephrologist request. Pt stated understanding and he will follow up with [place] on Monday. Pt will call us and let us know if he develops any symptoms.

## 2020-01-22 ENCOUNTER — Other Ambulatory Visit: Payer: Self-pay

## 2020-01-22 ENCOUNTER — Ambulatory Visit: Payer: Medicare Other

## 2020-01-22 ENCOUNTER — Ambulatory Visit (INDEPENDENT_AMBULATORY_CARE_PROVIDER_SITE_OTHER): Payer: Medicare Other | Admitting: Internal Medicine

## 2020-01-22 ENCOUNTER — Encounter: Payer: Self-pay | Admitting: Internal Medicine

## 2020-01-22 VITALS — BP 164/88 | HR 71 | Temp 98.3°F | Ht 69.0 in | Wt 215.4 lb

## 2020-01-22 DIAGNOSIS — N1832 Chronic kidney disease, stage 3b: Secondary | ICD-10-CM | POA: Diagnosis not present

## 2020-01-22 DIAGNOSIS — D510 Vitamin B12 deficiency anemia due to intrinsic factor deficiency: Secondary | ICD-10-CM

## 2020-01-22 DIAGNOSIS — I10 Essential (primary) hypertension: Secondary | ICD-10-CM | POA: Diagnosis not present

## 2020-01-22 DIAGNOSIS — N3281 Overactive bladder: Secondary | ICD-10-CM

## 2020-01-22 DIAGNOSIS — E1142 Type 2 diabetes mellitus with diabetic polyneuropathy: Secondary | ICD-10-CM | POA: Diagnosis not present

## 2020-01-22 MED ORDER — SOLIFENACIN SUCCINATE 10 MG PO TABS: 10.0000 mg | ORAL_TABLET | Freq: Every day | ORAL | 1 refills | Status: DC

## 2020-01-22 MED ORDER — CYANOCOBALAMIN 1000 MCG/ML IJ SOLN
1000.0000 ug | Freq: Once | INTRAMUSCULAR | Status: AC
  Administered 2020-01-22: 1000 ug via INTRAMUSCULAR

## 2020-01-22 NOTE — Progress Notes (Signed)
Subjective:  Patient ID: Frank [name], male    DOB: 1941-03-09  Age: 79 y.o. MRN: [id number]  CC: Anemia and Diabetes  This visit occurred during the SARS-CoV-2 public health emergency.  Safety protocols were in place, including screening questions prior to the visit, additional usage of staff PPE, and extensive cleaning of exam room while observing appropriate contact time as indicated for disinfecting solutions.    HPI Gerrad Welker presents for f/up - He continues to complain of OAB symptoms.  He has nocturia up to 2-3 times a night with frequent urination during the day with urinary urgency, urinary leakage, and urinary incontinence.  He wears a diaper.  He does not think he is getting much symptom relief with oxybutynin.  I recently got a message from a nephrologist asking me to discontinue the ARB.  He feels like his blood pressure and blood sugar have been well controlled.  He denies headache, blurred vision, chest pain, shortness of breath, polydipsia, or polyphagia.  Outpatient Medications Prior to Visit  Medication Sig Dispense Refill  . atorvastatin (LIPITOR) 20 MG tablet TAKE ONE-HALF TABLET BY  MOUTH DAILY 45 tablet 1  . azelastine (ASTELIN) 0.1 % nasal spray Place 1 spray into both nostrils 2 (two) times daily. Use in each nostril as directed (Patient not taking: Reported on 12/22/2019) 90 mL 1  . Carbonyl Iron 25 MG TABS Take 25 mg by mouth daily.    . Cholecalciferol (VITAMIN D3) 50 MCG (2000 UT) TABS Take 1 tablet by mouth once daily 90 tablet 1  . doxazosin (CARDURA) 4 MG tablet TAKE ONE-HALF TABLET BY  MOUTH AT BEDTIME 45 tablet 1  . FLUoxetine (PROZAC) 20 MG capsule TAKE 1 CAPSULE BY MOUTH  DAILY 90 capsule 1  . fluticasone (FLONASE) 50 MCG/ACT nasal spray Place 1 spray into both nostrils daily as needed for allergies or rhinitis.    [name] glipiZIDE (GLUCOTROL XL) 10 MG 24 hr tablet TAKE 1 TABLET BY MOUTH  DAILY 90 tablet 1  . levocetirizine (XYZAL) 5 MG tablet  Take 1 tablet (5 mg total) by mouth every evening. 90 tablet 1  . triamcinolone cream (KENALOG) 0.1 %     . oxybutynin (DITROPAN) 5 MG tablet TAKE 1 TABLET BY MOUTH  TWICE DAILY 180 tablet 1  . pantoprazole (PROTONIX) 40 MG tablet TAKE 1 TABLET BY MOUTH  DAILY 90 tablet 3   No facility-administered medications prior to visit.    ROS Review of Systems  Constitutional: Negative for appetite change, diaphoresis, fatigue and unexpected weight change.  HENT: Negative.   Eyes: Negative for visual disturbance.  Respiratory: Negative for cough, chest tightness, shortness of breath and wheezing.   Cardiovascular: Negative for chest pain, palpitations and leg swelling.  Gastrointestinal: Negative for abdominal pain, diarrhea, nausea and vomiting.  Endocrine: Positive for polyuria.  Genitourinary: Positive for frequency and urgency. Negative for difficulty urinating and dysuria.  Musculoskeletal: Negative for arthralgias and myalgias.  Skin: Negative.   Neurological: Negative for dizziness, weakness and light-headedness.  Hematological: Negative for adenopathy. Does not bruise/bleed easily.  Psychiatric/Behavioral: Negative.     Objective:  BP (!) 164/88 (BP Location: Left Arm, Patient Position: Sitting, Cuff Size: Normal)   Pulse 71   Temp 98.3 F (36.8 C) (Oral)   Ht 5\' 9"  (1.753 m)   Wt 215 lb 6 oz (97.7 kg)   SpO2 92%   BMI 31.81 kg/m   BP Readings from Last 3 Encounters:  01/22/20 (!) 164/88  12/12/19 128/62  12/09/19 139/64    Wt Readings from Last 3 Encounters:  01/22/20 215 lb 6 oz (97.7 kg)  12/12/19 218 lb (98.9 kg)  12/08/19 217 lb 14.4 oz (98.8 kg)    Physical Exam Vitals reviewed.  Constitutional:      Appearance: Normal appearance.  HENT:     Nose: Nose normal.     Mouth/Throat:     Mouth: Mucous membranes are moist.  Eyes:     General: No scleral icterus.    Conjunctiva/sclera: Conjunctivae normal.  Cardiovascular:     Rate and Rhythm: Normal rate and  regular rhythm.     Heart sounds: No murmur heard.   Pulmonary:     Effort: Pulmonary effort is normal.     Breath sounds: No stridor. No wheezing, rhonchi or rales.  Abdominal:     General: Abdomen is flat. Bowel sounds are normal. There is no distension.     Palpations: Abdomen is soft. There is no hepatomegaly, splenomegaly or mass.     Tenderness: There is no abdominal tenderness.  Musculoskeletal:        General: Normal range of motion.     Cervical back: Neck supple.     Right lower leg: No edema.     Left lower leg: No edema.  Lymphadenopathy:     Cervical: No cervical adenopathy.  Skin:    General: Skin is warm and dry.     Coloration: Skin is not pale.  Neurological:     General: No focal deficit present.     Mental Status: He is alert.  Psychiatric:        Mood and Affect: Mood normal.        Behavior: Behavior normal.     Lab Results  Component Value Date   WBC 5.8 01/22/2020   HGB 11.2 (L) 01/22/2020   HCT 34.7 (L) 01/22/2020   PLT 154 01/22/2020   GLUCOSE 198 (H) 12/12/2019   CHOL 130 04/20/2019   TRIG 89.0 04/20/2019   HDL 46.10 04/20/2019   LDLCALC 66 04/20/2019   ALT 19 08/04/2017   AST 20 08/04/2017   NA 137 12/12/2019   K 4.6 12/12/2019   CL 101 12/12/2019   CREATININE 1.93 (H) 12/12/2019   BUN 40 (H) 12/12/2019   CO2 28 12/12/2019   TSH 2.02 05/25/2018   PSA 1.08 07/10/2015   INR 0.98 08/04/2017   HGBA1C 7.3 (H) 01/22/2020   MICROALBUR 10.9 (H) 11/27/2019    DG Chest 2 View  Result Date: 12/08/2019 CLINICAL DATA:  Cough EXAM: CHEST - 2 VIEW COMPARISON:  11/21/2018, 03/18/2016 FINDINGS: Chronic blunting of right CP angle. No acute consolidation or pneumothorax. Stable cardiomediastinal silhouette. Chronic right fifth rib deformity and distal right clavicle deformity. Aortic atherosclerosis. IMPRESSION: No active cardiopulmonary disease. Electronically Signed   By: Donavan Foil M.D.   On: 12/08/2019 23:50    Assessment & Plan:   Leonce  was seen today for anemia and diabetes.  Diagnoses and all orders for this visit:  OAB (overactive bladder)- I am hopeful he will get better symptom relief with Vesicare. -     solifenacin (VESICARE) 10 MG tablet; Take 1 tablet (10 mg total) by mouth daily.  Essential hypertension- Considering his age and comorbid illnesses his blood pressure is adequately well controlled.  The ARB was discontinued at the request of a nephrologist. -     CBC with Differential/Platelet; Future -     CBC with Differential/Platelet  Type  2 diabetes mellitus with peripheral neuropathy ([place])- His blood sugar is adequately well controlled. -     Hemoglobin A1c; Future -     Hemoglobin A1c  Vitamin B12 deficiency anemia due to intrinsic factor deficiency- His H&H have improved slightly.  I recommended that he continue parenteral B12 replacement therapy. -     CBC with Differential/Platelet; Future -     cyanocobalamin ((VITAMIN B-12)) injection 1,000 mcg -     CBC with Differential/Platelet  Stage 3b chronic kidney disease- See above.   I have discontinued [name] [name] "ken"'s pantoprazole and oxybutynin. I am also having him start on solifenacin. Additionally, I am having him maintain his Carbonyl Iron, fluticasone, triamcinolone cream, Vitamin D3, glipiZIDE, FLUoxetine, azelastine, levocetirizine, atorvastatin, and doxazosin. We administered cyanocobalamin.  Meds ordered this encounter  Medications  . solifenacin (VESICARE) 10 MG tablet    Sig: Take 1 tablet (10 mg total) by mouth daily.    Dispense:  90 tablet    Refill:  1  . cyanocobalamin ((VITAMIN B-12)) injection 1,000 mcg     Follow-up: Return in about 6 months (around 07/24/2020).  Scarlette Calico, MD

## 2020-01-22 NOTE — Patient Instructions (Signed)
Type 2 Diabetes Mellitus, Diagnosis, Adult Type 2 diabetes (type 2 diabetes mellitus) is a long-term (chronic) disease. In type 2 diabetes, one or both of these problems may be present:  The pancreas does not make enough of a hormone called insulin.  Cells in the body do not respond properly to insulin that the body makes (insulin resistance). Normally, insulin allows blood sugar (glucose) to enter cells in the body. The cells use glucose for energy. Insulin resistance or lack of insulin causes excess glucose to build up in the blood instead of going into cells. As a result, high blood glucose (hyperglycemia) develops. What increases the risk? The following factors may make you more likely to develop type 2 diabetes:  Having a family member with type 2 diabetes.  Being overweight or obese.  Having an inactive (sedentary) lifestyle.  Having been diagnosed with insulin resistance.  Having a history of prediabetes, gestational diabetes, or polycystic ovary syndrome (PCOS).  Being of American-Indian, African-American, Hispanic/Latino, or Asian/Pacific Islander descent. What are the signs or symptoms? In the early stage of this condition, you may not have symptoms. Symptoms develop slowly and may include:  Increased thirst (polydipsia).  Increased hunger(polyphagia).  Increased urination (polyuria).  Increased urination during the night (nocturia).  Unexplained weight loss.  Frequent infections that keep coming back (recurring).  Fatigue.  Weakness.  Vision changes, such as blurry vision.  Cuts or bruises that are slow to heal.  Tingling or numbness in the hands or feet.  Dark patches on the skin (acanthosis nigricans). How is this diagnosed? This condition is diagnosed based on your symptoms, your medical history, a physical exam, and your blood glucose level. Your blood glucose may be checked with one or more of the following blood tests:  A fasting blood glucose (FBG)  test. You will not be allowed to eat (you will fast) for 8 hours or longer before a blood sample is taken.  A random blood glucose test. This test checks blood glucose at any time of day regardless of when you ate.  An A1c (hemoglobin A1c) blood test. This test provides information about blood glucose control over the previous 2-3 months.  An oral glucose tolerance test (OGTT). This test measures your blood glucose at two times: ? After fasting. This is your baseline blood glucose level. ? Two hours after drinking a beverage that contains glucose. You may be diagnosed with type 2 diabetes if:  Your FBG level is 126 mg/dL (7.0 mmol/L) or higher.  Your random blood glucose level is 200 mg/dL (11.1 mmol/L) or higher.  Your A1c level is 6.5% or higher.  Your OGTT result is higher than 200 mg/dL (11.1 mmol/L). These blood tests may be repeated to confirm your diagnosis. How is this treated? Your treatment may be managed by a specialist called an endocrinologist. Type 2 diabetes may be treated by following instructions from your health care provider about:  Making diet and lifestyle changes. This may include: ? Following an individualized nutrition plan that is developed by a diet and nutrition specialist (registered dietitian). ? Exercising regularly. ? Finding ways to manage stress.  Checking your blood glucose level as often as told.  Taking diabetes medicines or insulin daily. This helps to keep your blood glucose levels in the healthy range. ? If you use insulin, you may need to adjust the dosage depending on how physically active you are and what foods you eat. Your health care provider will tell you how to adjust your dosage.    Taking medicines to help prevent complications from diabetes, such as: ? Aspirin. ? Medicine to lower cholesterol. ? Medicine to control blood pressure. Your health care provider will set individualized treatment goals for you. Your goals will be based on  your age, other medical conditions you have, and how you respond to diabetes treatment. Generally, the goal of treatment is to maintain the following blood glucose levels:  Before meals (preprandial): 80-130 mg/dL (4.4-7.2 mmol/L).  After meals (postprandial): below 180 mg/dL (10 mmol/L).  A1c level: less than 7%. Follow these instructions at home: Questions to ask your health care provider  Consider asking the following questions: ? Do I need to meet with a diabetes educator? ? Where can I find a support group for people with diabetes? ? What equipment will I need to manage my diabetes at home? ? What diabetes medicines do I need, and when should I take them? ? How often do I need to check my blood glucose? ? What number can I call if I have questions? ? When is my next appointment? General instructions  Take over-the-counter and prescription medicines only as told by your health care provider.  Keep all follow-up visits as told by your health care provider. This is important.  For more information about diabetes, visit: ? American Diabetes Association (ADA): www.diabetes.org ? American Association of Diabetes Educators (AADE): www.diabeteseducator.org Contact a health care provider if:  Your blood glucose is at or above 240 mg/dL (13.3 mmol/L) for 2 days in a row.  You have been sick or have had a fever for 2 days or longer, and you are not getting better.  You have any of the following problems for more than 6 hours: ? You cannot eat or drink. ? You have nausea and vomiting. ? You have diarrhea. Get help right away if:  Your blood glucose is lower than 54 mg/dL (3.0 mmol/L).  You become confused or you have trouble thinking clearly.  You have difficulty breathing.  You have moderate or large ketone levels in your urine. Summary  Type 2 diabetes (type 2 diabetes mellitus) is a long-term (chronic) disease. In type 2 diabetes, the pancreas does not make enough of a  hormone called insulin, or cells in the body do not respond properly to insulin that the body makes (insulin resistance).  This condition is treated by making diet and lifestyle changes and taking diabetes medicines or insulin.  Your health care provider will set individualized treatment goals for you. Your goals will be based on your age, other medical conditions you have, and how you respond to diabetes treatment.  Keep all follow-up visits as told by your health care provider. This is important. This information is not intended to replace advice given to you by your health care provider. Make sure you discuss any questions you have with your health care provider. Document Revised: 08/27/2017 Document Reviewed: 08/02/2015 Elsevier Patient Education  2020 [employer]  

## 2020-01-23 ENCOUNTER — Encounter: Payer: Self-pay | Admitting: Internal Medicine

## 2020-01-23 LAB — CBC WITH DIFFERENTIAL/PLATELET
Absolute Monocytes: 655 cells/uL (ref 200–950)
Basophils Absolute: 41 cells/uL (ref 0–200)
Basophils Relative: 0.7 %
Eosinophils Absolute: 249 cells/uL (ref 15–500)
Eosinophils Relative: 4.3 %
HCT: 34.7 % — ABNORMAL LOW (ref 38.5–50.0)
Hemoglobin: 11.2 g/dL — ABNORMAL LOW (ref 13.2–17.1)
Lymphs Abs: 1322 cells/uL (ref 850–3900)
MCH: 27.7 pg (ref 27.0–33.0)
MCHC: 32.3 g/dL (ref 32.0–36.0)
MCV: 85.9 fL (ref 80.0–100.0)
MPV: 9.7 fL (ref 7.5–12.5)
Monocytes Relative: 11.3 %
Neutro Abs: 3532 cells/uL (ref 1500–7800)
Neutrophils Relative %: 60.9 %
Platelets: 154 10*3/uL (ref 140–400)
RBC: 4.04 10*6/uL — ABNORMAL LOW (ref 4.20–5.80)
RDW: 12.3 % (ref 11.0–15.0)
Total Lymphocyte: 22.8 %
WBC: 5.8 10*3/uL (ref 3.8–10.8)

## 2020-01-23 LAB — HEMOGLOBIN A1C
Hgb A1c MFr Bld: 7.3 % of total Hgb — ABNORMAL HIGH (ref <5.7)
Mean Plasma Glucose: 163 (calc)
eAG (mmol/L): 9 (calc)

## 2020-01-24 ENCOUNTER — Other Ambulatory Visit: Payer: Self-pay | Admitting: Internal Medicine

## 2020-01-28 ENCOUNTER — Emergency Department (HOSPITAL_COMMUNITY)
Admission: EM | Admit: 2020-01-28 | Discharge: 2020-01-28 | Disposition: A | Discharge status: Home / Self Care | Payer: Medicare Other | Attending: Emergency Medicine | Admitting: Emergency Medicine

## 2020-01-28 ENCOUNTER — Other Ambulatory Visit: Payer: Self-pay

## 2020-01-28 ENCOUNTER — Emergency Department (HOSPITAL_COMMUNITY): Payer: Medicare Other

## 2020-01-28 ENCOUNTER — Encounter: Payer: Self-pay | Admitting: Internal Medicine

## 2020-01-28 DIAGNOSIS — N183 Chronic kidney disease, stage 3 unspecified: Secondary | ICD-10-CM | POA: Diagnosis not present

## 2020-01-28 DIAGNOSIS — Y92512 Supermarket, store or market as the place of occurrence of the external cause: Secondary | ICD-10-CM | POA: Insufficient documentation

## 2020-01-28 DIAGNOSIS — E1165 Type 2 diabetes mellitus with hyperglycemia: Secondary | ICD-10-CM | POA: Diagnosis not present

## 2020-01-28 DIAGNOSIS — Y939 Activity, unspecified: Secondary | ICD-10-CM | POA: Insufficient documentation

## 2020-01-28 DIAGNOSIS — Y999 Unspecified external cause status: Secondary | ICD-10-CM | POA: Insufficient documentation

## 2020-01-28 DIAGNOSIS — S01112A Laceration without foreign body of left eyelid and periocular area, initial encounter: Secondary | ICD-10-CM | POA: Diagnosis not present

## 2020-01-28 DIAGNOSIS — S0993XA Unspecified injury of face, initial encounter: Secondary | ICD-10-CM | POA: Diagnosis present

## 2020-01-28 DIAGNOSIS — E1140 Type 2 diabetes mellitus with diabetic neuropathy, unspecified: Secondary | ICD-10-CM | POA: Insufficient documentation

## 2020-01-28 DIAGNOSIS — W010XXA Fall on same level from slipping, tripping and stumbling without subsequent striking against object, initial encounter: Secondary | ICD-10-CM | POA: Insufficient documentation

## 2020-01-28 DIAGNOSIS — Z79899 Other long term (current) drug therapy: Secondary | ICD-10-CM | POA: Insufficient documentation

## 2020-01-28 DIAGNOSIS — R519 Headache, unspecified: Secondary | ICD-10-CM | POA: Insufficient documentation

## 2020-01-28 DIAGNOSIS — G319 Degenerative disease of nervous system, unspecified: Secondary | ICD-10-CM | POA: Diagnosis not present

## 2020-01-28 DIAGNOSIS — S61412A Laceration without foreign body of left hand, initial encounter: Secondary | ICD-10-CM | POA: Diagnosis not present

## 2020-01-28 DIAGNOSIS — S299XXA Unspecified injury of thorax, initial encounter: Secondary | ICD-10-CM | POA: Diagnosis not present

## 2020-01-28 DIAGNOSIS — R58 Hemorrhage, not elsewhere classified: Secondary | ICD-10-CM | POA: Diagnosis not present

## 2020-01-28 DIAGNOSIS — M79642 Pain in left hand: Secondary | ICD-10-CM | POA: Diagnosis not present

## 2020-01-28 DIAGNOSIS — J984 Other disorders of lung: Secondary | ICD-10-CM | POA: Diagnosis not present

## 2020-01-28 DIAGNOSIS — W19XXXA Unspecified fall, initial encounter: Secondary | ICD-10-CM | POA: Diagnosis not present

## 2020-01-28 DIAGNOSIS — Z7984 Long term (current) use of oral hypoglycemic drugs: Secondary | ICD-10-CM | POA: Insufficient documentation

## 2020-01-28 DIAGNOSIS — Z87891 Personal history of nicotine dependence: Secondary | ICD-10-CM | POA: Insufficient documentation

## 2020-01-28 DIAGNOSIS — I129 Hypertensive chronic kidney disease with stage 1 through stage 4 chronic kidney disease, or unspecified chronic kidney disease: Secondary | ICD-10-CM | POA: Insufficient documentation

## 2020-01-28 DIAGNOSIS — S0181XA Laceration without foreign body of other part of head, initial encounter: Secondary | ICD-10-CM | POA: Diagnosis not present

## 2020-01-28 DIAGNOSIS — G9389 Other specified disorders of brain: Secondary | ICD-10-CM | POA: Diagnosis not present

## 2020-01-28 DIAGNOSIS — S0101XA Laceration without foreign body of scalp, initial encounter: Secondary | ICD-10-CM

## 2020-01-28 DIAGNOSIS — S199XXA Unspecified injury of neck, initial encounter: Secondary | ICD-10-CM | POA: Diagnosis not present

## 2020-01-28 DIAGNOSIS — M47812 Spondylosis without myelopathy or radiculopathy, cervical region: Secondary | ICD-10-CM | POA: Diagnosis not present

## 2020-01-28 DIAGNOSIS — S0990XA Unspecified injury of head, initial encounter: Secondary | ICD-10-CM | POA: Diagnosis not present

## 2020-01-28 DIAGNOSIS — J3489 Other specified disorders of nose and nasal sinuses: Secondary | ICD-10-CM | POA: Diagnosis not present

## 2020-01-28 NOTE — ED Triage Notes (Signed)
Pt was walking out of restaurant and tripped/fell off of curb. Patient did not lose consciousness and does not take blood thinners. Patient has skin tear above left eyebrow and on left hand. Patient is ambulatory with assistance, denies dizziness, is AxOx4.

## 2020-01-28 NOTE — ED Provider Notes (Signed)
[facility] DEPT Provider Note   CSN: [id number] Arrival date & time: 01/28/20  2026     History Chief Complaint  Patient presents with  . Fall    Frank [name] is a 79 y.o. male history diabetes, reflux, hypertension, hyperlipidemia, previous subdural hemorrhage here presenting with fall.  Patient states that he just finished eating at [employer] and went out the store and tripped and fell.  He states that he hit his head on the curb and also injured his left hand.  He states that he also has left rib pain as well.  Patient denies passing out or loss of consciousness.  Patient states that he has mild headache.  Patient had previous subdural hemorrhage is not currently on blood thinners.  The history is provided by the patient.       Past Medical History:  Diagnosis Date  . Allergy    Rhinitis  . Anemia    NOS iron deficient and B12 deficient  . Arthritis   . Diabetes mellitus    Type 2  . GERD (gastroesophageal reflux disease)   . Hyperlipidemia   . Hypertension   . 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 OSA (obstructive sleep apnea)    cpap  . TBI (traumatic brain injury) [facility])     Patient Active Problem List   Diagnosis Date Noted  . 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 Chronic renal disease, stage 3, moderately decreased glomerular filtration rate (GFR) between 30-59 mL/min/1.73 square meter 11/21/2018  . GERD without esophagitis 07/22/2017  . Depression   . Type 2 diabetes mellitus with peripheral neuropathy ([name])   . OAB (overactive bladder) 11/26/2014  . Routine general medical examination at a health care facility 04/06/2014  . Vitamin D deficiency 07/23/2010  . Obstructive sleep apnea 09/13/2008  . Hyperlipidemia with target LDL less than 100 07/27/2007  . BPH (benign prostatic hyperplasia) 06/21/2007  . 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 B12  deficiency anemia 04/15/2006  . Essential hypertension 04/15/2006  . Allergic rhinitis 04/15/2006    Past Surgical History:  Procedure Laterality Date  . APPENDECTOMY    . arm fracture Left 2011   with hardware  . BURR HOLE Bilateral 08/04/2017   Procedure: Haskell Flirt;  Surgeon: [name], MD;  Location: [facility];  Service: Neurosurgery;  Laterality: Bilateral;  . COLONOSCOPY    . CRANIOTOMY Left 12/01/2017   Procedure: Trudee [facility] Holes CRANIOTOMY HEMATOMA EVACUATION SUBDURAL;  Surgeon: [name], MD;  Location: [facility];  Service: Neurosurgery;  Laterality: Left;  . ESOPHAGOGASTRODUODENOSCOPY  2006   gastritis  . ROTATOR CUFF REPAIR    . SEPTOPLASTY  1959   Deviated Septum  . 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 Heart disease Mother   . Breast cancer Mother   . Stroke Father   . Allergies Father        Father and children  . Coronary artery disease Brother   . Diabetes Neg Hx   . Colon cancer Neg Hx     Social History   Tobacco Use  . Smoking status: Former Smoker    Packs/day: 1.00    Quit date: 07/14/1983    Years since quitting: 36.5  . Smokeless tobacco: Never Used  Vaping Use  . Vaping Use: Never used  Substance Use Topics  . Alcohol use:  Yes    Alcohol/week: 2.0 standard drinks    Types: 2 Standard drinks or equivalent per week    Comment: occassionally  . Drug use: No    Home Medications Prior to Admission medications   Medication Sig Start Date End Date Taking? Authorizing Provider  atorvastatin (LIPITOR) 20 MG tablet TAKE ONE-HALF TABLET BY  MOUTH DAILY 12/24/19   Janith Lima, MD  azelastine (ASTELIN) 0.1 % nasal spray Place 1 spray into both nostrils 2 (two) times daily. Use in each nostril as directed Patient not taking: Reported on 12/22/2019 11/27/19   Janith Lima, MD  Carbonyl Iron 25 MG TABS Take 25 mg by mouth daily.    [provider]  Cholecalciferol (VITAMIN D3) 50 MCG (2000 UT)  TABS Take 1 tablet by mouth once daily 09/05/19   Janith Lima, MD  doxazosin (CARDURA) 4 MG tablet TAKE ONE-HALF TABLET BY  MOUTH AT BEDTIME 12/24/19   Janith Lima, MD  FLUoxetine (PROZAC) 20 MG capsule TAKE 1 CAPSULE BY MOUTH  DAILY 10/27/19   Janith Lima, MD  fluticasone (FLONASE) 50 MCG/ACT nasal spray Place 1 spray into both nostrils daily as needed for allergies or rhinitis.    [provider]  glipiZIDE (GLUCOTROL XL) 10 MG 24 hr tablet TAKE 1 TABLET BY MOUTH  DAILY 10/27/19   Janith Lima, MD  levocetirizine (XYZAL) 5 MG tablet Take 1 tablet (5 mg total) by mouth every evening. 11/27/19   Janith Lima, MD  solifenacin (VESICARE) 10 MG tablet Take 1 tablet (10 mg total) by mouth daily. 01/22/20   Janith Lima, MD  triamcinolone cream (KENALOG) 0.1 %  07/26/19   [provider]  amLODipine (NORVASC) 10 MG tablet TAKE 1 TABLET BY MOUTH  DAILY 07/20/19   Janith Lima, MD  atorvastatin (LIPITOR) 20 MG tablet TAKE ONE-HALF TABLET BY  MOUTH DAILY 07/20/19   Janith Lima, MD  Cholecalciferol (VITAMIN D3) 50 MCG (2000 UT) TABS Take 1 tablet by mouth once daily 06/05/19   Janith Lima, MD  doxazosin (CARDURA) 4 MG tablet TAKE ONE-HALF TABLET BY  MOUTH AT BEDTIME 07/20/19   Janith Lima, MD  telmisartan (MICARDIS) 80 MG tablet TAKE 1 TABLET BY MOUTH  DAILY 07/20/19   Janith Lima, MD    Allergies    Lisinopril, Penicillins, and Sulfamethoxazole  Review of Systems   Review of Systems  Neurological: Positive for headaches.  All other systems reviewed and are negative.   Physical Exam Updated Vital Signs BP (!) 154/92   Pulse 72   Temp (!) 97.4 F (36.3 C) (Oral)   Resp 16   Ht 5\' 9"  (1.753 m)   Wt 98 kg   SpO2 96%   BMI 31.90 kg/m   Physical Exam Vitals and nursing note reviewed.  Constitutional:      Appearance: Normal appearance.  HENT:     Head: Normocephalic.     Comments: 1 cm laceration above the left eyebrow.  There is minimal  bleeding is controlled.  Wound is well approximated and appears clean.    Nose: Nose normal.     Mouth/Throat:     Mouth: Mucous membranes are moist.  Eyes:     Extraocular Movements: Extraocular movements intact.     Pupils: Pupils are equal, round, and reactive to light.  Cardiovascular:     Rate and Rhythm: Normal rate and regular rhythm.     Pulses: Normal pulses.  Heart sounds: Normal heart sounds.  Pulmonary:     Effort: Pulmonary effort is normal.     Breath sounds: Normal breath sounds.     Comments: Mild left lower chest wall tenderness.  There is no ecchymosis or bruising. Abdominal:     General: Abdomen is flat.     Palpations: Abdomen is soft.  Musculoskeletal:        General: Normal range of motion.     Cervical back: Normal range of motion and neck supple.     Comments: No midline spinal tenderness and pelvis is stable.  Skin:    Capillary Refill: Capillary refill takes less than 2 seconds.     Comments: Skin tear to the left hand.  Neurovascular intact in the left hand.  No other extremity trauma.  Neurological:     General: No focal deficit present.     Mental Status: He is alert and oriented to person, place, and time.  Psychiatric:        Mood and Affect: Mood normal.        Behavior: Behavior normal.     ED Results / Procedures / Treatments   Labs (all labs ordered are listed, but only abnormal results are displayed) Labs Reviewed - No data to display  EKG None  Radiology No results found.  Procedures Procedures (including critical care time)  LACERATION REPAIR Performed by: Wandra Arthurs Authorized by: Wandra Arthurs Consent: Verbal consent obtained. Risks and benefits: risks, benefits and alternatives were discussed Consent given by: patient Patient identity confirmed: provided demographic data Prepped and Draped in normal sterile fashion Wound explored  Laceration Location: L eyebrow  Laceration Length: 1 cm  No Foreign Bodies seen or  palpated  Anesthesia: local infiltration  Local anesthetic: none   Irrigation method: syringe Amount of cleaning: standard  Skin closure: dermabond    Patient tolerance: Patient tolerated the procedure well with no immediate complications.   Medications Ordered in ED Medications - No data to display  ED Course  I have reviewed the triage vital signs and the nursing notes.  Pertinent labs & imaging results that were available during my care of the patient were reviewed by me and considered in my medical decision making (see chart for details).    MDM Rules/Calculators/A&P                          Dvante Hands is a 79 y.o. male here presenting with fall.  Patient who is a small laceration above the left eyebrow.  Patient does have a history of subdural hemorrhage so we will get a CT head and cervical spine.  Also will get rib x-rays to rule out rib fracture.   9:52 PM CT showed no acute bleed.  He only has chronic hygromas.  X-ray showed no fracture.  Laceration was Dermabond.  Stable for discharge.   Final Clinical Impression(s) / ED Diagnoses Final diagnoses:  None    Rx / DC Orders ED Discharge Orders    None       Drenda Freeze, MD 01/28/20 2152

## 2020-01-28 NOTE — Discharge Instructions (Signed)
Take Tylenol for pain.  Your CT scan today did not show any acute bleeding.  See your doctor for follow-up.  Return to ER if you have worse headaches, uncontrolled bleeding, vomiting, weakness.

## 2020-01-30 ENCOUNTER — Encounter: Payer: Self-pay | Admitting: Internal Medicine

## 2020-01-30 ENCOUNTER — Ambulatory Visit (INDEPENDENT_AMBULATORY_CARE_PROVIDER_SITE_OTHER): Payer: Medicare Other | Admitting: Internal Medicine

## 2020-01-30 ENCOUNTER — Other Ambulatory Visit: Payer: Self-pay

## 2020-01-30 VITALS — BP 160/80 | HR 80 | Temp 97.8°F | Resp 16 | Ht 69.0 in | Wt 217.0 lb

## 2020-01-30 DIAGNOSIS — S0003XD Contusion of scalp, subsequent encounter: Secondary | ICD-10-CM | POA: Diagnosis not present

## 2020-01-30 NOTE — Progress Notes (Signed)
Subjective:  Patient ID: Frank [name], male    DOB: 07-Jun-1941  Age: 79 y.o. MRN: [id number]  CC: Fall  This visit occurred during the SARS-CoV-2 public health emergency.  Safety protocols were in place, including screening questions prior to the visit, additional usage of staff PPE, and extensive cleaning of exam room while observing appropriate contact time as indicated for disinfecting solutions.    HPI Frank [name] presents for f/up - He was walking to a restaurant about 2 days ago when he stepped off the curb and sustained a ground-level fall injuring the left side of his body.  He was seen in the ED and a laceration on his left forehead was repaired with glue.  His x-rays were unremarkable.  He did not lose consciousness.  He denies headache, blurred vision, ataxia, neck pain, back pain, any sources of bleeding, or dizziness.  He has abrasions on his left upper and left lower lower extremity that are not bothering him.  He can use his extremities and has good range of motion in all of his joints.  He has soreness in his left rib cage but is getting symptom relief with Tylenol.  Outpatient Medications Prior to Visit  Medication Sig Dispense Refill  . atorvastatin (LIPITOR) 20 MG tablet TAKE ONE-HALF TABLET BY  MOUTH DAILY 45 tablet 1  . azelastine (ASTELIN) 0.1 % nasal spray Place 1 spray into both nostrils 2 (two) times daily. Use in each nostril as directed 90 mL 1  . Carbonyl Iron 25 MG TABS Take 25 mg by mouth daily.    . Cholecalciferol (VITAMIN D3) 50 MCG (2000 UT) TABS Take 1 tablet by mouth once daily 90 tablet 1  . doxazosin (CARDURA) 4 MG tablet TAKE ONE-HALF TABLET BY  MOUTH AT BEDTIME 45 tablet 1  . FLUoxetine (PROZAC) 20 MG capsule TAKE 1 CAPSULE BY MOUTH  DAILY 90 capsule 1  . fluticasone (FLONASE) 50 MCG/ACT nasal spray Place 1 spray into both nostrils daily as needed for allergies or rhinitis.    [name] glipiZIDE (GLUCOTROL XL) 10 MG 24 hr  tablet TAKE 1 TABLET BY MOUTH  DAILY 90 tablet 1  . levocetirizine (XYZAL) 5 MG tablet Take 1 tablet (5 mg total) by mouth every evening. 90 tablet 1  . solifenacin (VESICARE) 10 MG tablet Take 1 tablet (10 mg total) by mouth daily. 90 tablet 1  . triamcinolone cream (KENALOG) 0.1 %      No facility-administered medications prior to visit.    ROS Review of Systems  Constitutional: Negative.  Negative for diaphoresis and fatigue.  HENT: Positive for facial swelling. Negative for nosebleeds, sinus pressure, sinus pain and trouble swallowing.   Eyes: Negative.  Negative for photophobia, pain and visual disturbance.  Respiratory: Negative for cough, chest tightness, shortness of breath and wheezing.   Cardiovascular: Negative for chest pain, palpitations and leg swelling.  Gastrointestinal: Negative for nausea and vomiting.  Endocrine: Negative.   Genitourinary: Negative.  Negative for difficulty urinating.  Musculoskeletal: Positive for arthralgias. Negative for back pain and neck pain.  Skin: Positive for wound.  Neurological: Negative.  Negative for dizziness, weakness, light-headedness, numbness and headaches.  Hematological: Negative for adenopathy. Does not bruise/bleed easily.  Psychiatric/Behavioral: Negative.     Objective:  BP (!) 160/80 (BP Location: Left Arm, Patient Position: Sitting, Cuff Size: Normal)   Pulse 80   Temp 97.8 F (36.6 C) (Oral)   Resp 16  Ht 5\' 9"  (1.753 m)   Wt 217 lb (98.4 kg)   SpO2 95%   BMI 32.05 kg/m   BP Readings from Last 3 Encounters:  01/30/20 (!) 160/80  01/28/20 (!) 148/88  01/22/20 (!) 164/88    Wt Readings from Last 3 Encounters:  01/30/20 217 lb (98.4 kg)  01/28/20 216 lb (98 kg)  01/22/20 215 lb 6 oz (97.7 kg)    Physical Exam Vitals reviewed.  Constitutional:      Appearance: He is not ill-appearing.  HENT:     Head: Raccoon eyes, abrasion, contusion and laceration present. No Battle's sign.     Nose: Nose normal.      Mouth/Throat:     Mouth: Mucous membranes are moist.  Eyes:     General: No scleral icterus.    Conjunctiva/sclera: Conjunctivae normal.  Cardiovascular:     Rate and Rhythm: Normal rate and regular rhythm.     Pulses: Normal pulses.     Heart sounds: No murmur heard.   Pulmonary:     Effort: Pulmonary effort is normal.     Breath sounds: No stridor. No wheezing, rhonchi or rales.  Abdominal:     General: Abdomen is flat.     Palpations: There is no mass.     Tenderness: There is no abdominal tenderness. There is no guarding.  Musculoskeletal:        General: Normal range of motion.     Cervical back: Neck supple.     Right lower leg: No edema.     Left lower leg: No edema.     Comments: There are healing abrasions on his upper and lower extremities.  See photos.  Lymphadenopathy:     Cervical: No cervical adenopathy.  Skin:    General: Skin is warm and dry.  Neurological:     General: No focal deficit present.     Mental Status: He is alert and oriented to person, place, and time. Mental status is at baseline.     Lab Results  Component Value Date   WBC 5.8 01/22/2020   HGB 11.2 (L) 01/22/2020   HCT 34.7 (L) 01/22/2020   PLT 154 01/22/2020   GLUCOSE 198 (H) 12/12/2019   CHOL 130 04/20/2019   TRIG 89.0 04/20/2019   HDL 46.10 04/20/2019   LDLCALC 66 04/20/2019   ALT 19 08/04/2017   AST 20 08/04/2017   NA 137 12/12/2019   K 4.6 12/12/2019   CL 101 12/12/2019   CREATININE 1.93 (H) 12/12/2019   BUN 40 (H) 12/12/2019   CO2 28 12/12/2019   TSH 2.02 05/25/2018   PSA 1.08 07/10/2015   INR 0.98 08/04/2017   HGBA1C 7.3 (H) 01/22/2020   MICROALBUR 10.9 (H) 11/27/2019    DG Ribs Unilateral W/Chest Left  Result Date: 01/28/2020 CLINICAL DATA:  Diffuse left chest pain following a fall. EXAM: LEFT RIBS AND CHEST - 3+ VIEW COMPARISON:  Chest radiographs dated 12/08/2019. FINDINGS: Normal sized heart. Stable minimal linear scarring at both lung bases. Otherwise, clear  lungs. Again demonstrated is left humerus fixation hardware. No rib fracture or pneumothorax seen. IMPRESSION: No acute abnormality. Electronically Signed   By: Claudie Revering M.D.   On: 01/28/2020 21:13   CT Head Wo Contrast  Result Date: 01/28/2020 CLINICAL DATA:  Status post fall. EXAM: CT HEAD WITHOUT CONTRAST TECHNIQUE: Contiguous axial images were obtained from the base of the skull through the vertex without intravenous contrast. COMPARISON:  January 24, 2018 FINDINGS: Brain: There  is mild cerebral atrophy with widening of the extra-axial spaces and ventricular dilatation. There are areas of decreased attenuation within the white matter tracts of the supratentorial brain, consistent with microvascular disease changes. Small (approximately 2 mm to 3 mm) chronic bilateral subdural collections are seen within left frontal and bilateral posterior parietal regions. There is no evidence of significant mass effect or associated midline shift. Vascular: No hyperdense vessel or unexpected calcification. Skull: A right frontal burr hole is noted. A left frontal craniotomy defect is also seen. These are present on the prior study. Sinuses/Orbits: Moderate severity bilateral ethmoid sinus mucosal thickening is seen. Other: None. IMPRESSION: 1. Generalized cerebral atrophy. 2. Small chronic bilateral subdural collections without evidence of significant mass effect or associated midline shift. 3. Moderate severity bilateral ethmoid sinus mucosal thickening. Electronically Signed   By: Virgina Norfolk M.D.   On: 01/28/2020 21:07   CT Cervical Spine Wo Contrast  Result Date: 01/28/2020 CLINICAL DATA:  Fall EXAM: CT CERVICAL SPINE WITHOUT CONTRAST TECHNIQUE: Multidetector CT imaging of the cervical spine was performed without intravenous contrast. Multiplanar CT image reconstructions were also generated. COMPARISON:  None. FINDINGS: Alignment: No significant listhesis. Skull base and vertebrae: No acute cervical spine  fracture. No destructive osseous lesion. Vertebral body heights are maintained apart from degenerative endplate irregularity. Soft tissues and spinal canal: No prevertebral fluid or swelling. No visible canal hematoma. Disc levels: Multilevel degenerative changes are similar in appearance to 2018 apart from progression of left facet arthropathy. Upper chest: No apical lung mass. Other: Marked calcified plaque at the right greater than left ICA origins. There is potential significant stenosis on the right. IMPRESSION: No acute cervical spine fracture. Electronically Signed   By: Macy Mis M.D.   On: 01/28/2020 21:11   DG Hand Complete Left  Result Date: 01/28/2020 CLINICAL DATA:  Left hand pain and posterior skin tear following a fall. EXAM: LEFT HAND - COMPLETE 3+ VIEW COMPARISON:  12/11/2015 FINDINGS: Interval changes of erosive osteoarthritis involving the 4th PIP joint. Mild 1st IP joint degenerative changes. No fracture or dislocation seen. Dense arterial calcifications. IMPRESSION: 1. No fracture or dislocation. 2. 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 Electronically Signed   By: Claudie Revering M.D.   On: 01/28/2020 21:14    Assessment & Plan:   Owynn was seen today for fall.  Diagnoses and all orders for this visit:  Contusion of scalp, subsequent encounter- The laceration is healing nicely with no evidence of infection, complications, or dehiscence.  Overall he is recovering without any setbacks or complications.   I am having [name] [name] "ken" maintain his Carbonyl Iron, fluticasone, triamcinolone cream, Vitamin D3, glipiZIDE, FLUoxetine, azelastine, levocetirizine, atorvastatin, doxazosin, and solifenacin.  No orders of the defined types were placed in this encounter.    Follow-up: Return if symptoms worsen or fail to improve.  Scarlette Calico, MD

## 2020-02-01 ENCOUNTER — Encounter: Payer: Self-pay | Admitting: Internal Medicine

## 2020-02-01 DIAGNOSIS — S0003XA Contusion of scalp, initial encounter: Secondary | ICD-10-CM | POA: Insufficient documentation

## 2020-02-01 NOTE — Patient Instructions (Signed)
Facial or Scalp Contusion  A facial or scalp contusion is a deep bruise (contusion) on the face or head. Injuries to the face and head generally cause a lot of swelling, especially around the eyes. Contusions are the result of an injury that caused bleeding under the skin. The contusion may turn blue, purple, or yellow. Minor injuries will give you a painless contusion, but more severe contusions may stay painful and swollen for a few weeks. What are the causes? A facial or scalp contusion is caused by a blunt injury, fall, or trauma to the face or head area. What are the signs or symptoms? Symptoms of this condition include:  Swelling of the injured area.  Discoloration of the injured area.  Tenderness, soreness, or pain in the injured area. How is this diagnosed? This condition is diagnosed based on your medical history and a physical exam. An X-ray exam, CT scan, or MRI may be needed to check for any additional injuries, such as broken bones (fractures). How is this treated? Often, the best treatment for a facial or scalp contusion is applying cold compresses to the injured area. Over-the-counter medicines may also be recommended for pain control. Follow these instructions at home:  Take over-the-counter and prescription medicines only as told by your health care provider.  If directed, apply ice to the injured area. ? Put ice in a plastic bag. ? Place a towel between your skin and the bag. ? Leave the ice on for 20 minutes, 2-3 times a day.  Keep all follow-up visits as told by your health care provider. This is important. Contact a health care provider if:  You have trouble biting or chewing.  Your pain or swelling gets worse.  You have pain when you move your eyes. Get help right away if:  You have severe pain or a headache that is not relieved by medicine.  You have unusual sleepiness, confusion, or personality changes.  You vomit.  You have a nosebleed that does not  stop.  You have double vision or blurred vision.  You have a continuous clear fluid draining from your nose or ear.  You have trouble walking or using your arms or legs.  You have severe dizziness. Summary  A facial or scalp contusion is a deep bruise (contusion) on the face or head.  Contusions are the result of an injury that caused bleeding under the skin.  Minor injuries will give you a painless contusion, but more severe contusions may stay painful and swollen for a few weeks.  Often, the best treatment for a facial or scalp contusion is applying cold compresses to the injured area. This information is not intended to replace advice given to you by your health care provider. Make sure you discuss any questions you have with your health care provider. Document Revised: 06/11/2017 Document Reviewed: 05/19/2016 Elsevier Patient Education  2020 [employer]  

## 2020-02-08 ENCOUNTER — Ambulatory Visit
Admission: RE | Admit: 2020-02-08 | Discharge: 2020-02-08 | Disposition: A | Discharge status: Home / Self Care | Payer: Medicare Other | Source: Ambulatory Visit | Attending: Physical Medicine and Rehabilitation | Admitting: Physical Medicine and Rehabilitation

## 2020-02-08 ENCOUNTER — Other Ambulatory Visit: Payer: Self-pay | Admitting: Physical Medicine and Rehabilitation

## 2020-02-08 ENCOUNTER — Other Ambulatory Visit: Payer: Self-pay

## 2020-02-08 DIAGNOSIS — R4020 Unspecified coma: Secondary | ICD-10-CM

## 2020-02-08 DIAGNOSIS — S065X0A Traumatic subdural hemorrhage without loss of consciousness, initial encounter: Secondary | ICD-10-CM | POA: Diagnosis not present

## 2020-02-20 DIAGNOSIS — N1832 Chronic kidney disease, stage 3b: Secondary | ICD-10-CM | POA: Diagnosis not present

## 2020-02-20 DIAGNOSIS — N189 Chronic kidney disease, unspecified: Secondary | ICD-10-CM | POA: Diagnosis not present

## 2020-02-20 DIAGNOSIS — I129 Hypertensive chronic kidney disease with stage 1 through stage 4 chronic kidney disease, or unspecified chronic kidney disease: Secondary | ICD-10-CM | POA: Diagnosis not present

## 2020-02-20 DIAGNOSIS — R809 Proteinuria, unspecified: Secondary | ICD-10-CM | POA: Diagnosis not present

## 2020-02-20 DIAGNOSIS — N2581 Secondary hyperparathyroidism of renal origin: Secondary | ICD-10-CM | POA: Diagnosis not present

## 2020-02-20 DIAGNOSIS — D631 Anemia in chronic kidney disease: Secondary | ICD-10-CM | POA: Diagnosis not present

## 2020-02-21 ENCOUNTER — Other Ambulatory Visit: Payer: Self-pay | Admitting: Nephrology

## 2020-02-21 ENCOUNTER — Other Ambulatory Visit: Payer: Self-pay

## 2020-02-21 ENCOUNTER — Ambulatory Visit (INDEPENDENT_AMBULATORY_CARE_PROVIDER_SITE_OTHER): Payer: Medicare Other | Admitting: *Deleted

## 2020-02-21 DIAGNOSIS — N1832 Chronic kidney disease, stage 3b: Secondary | ICD-10-CM

## 2020-02-21 DIAGNOSIS — E538 Deficiency of other specified B group vitamins: Secondary | ICD-10-CM

## 2020-02-21 MED ORDER — CYANOCOBALAMIN 1000 MCG/ML IJ SOLN
1000.0000 ug | Freq: Once | INTRAMUSCULAR | Status: AC
  Administered 2020-02-21: 1000 ug via INTRAMUSCULAR

## 2020-02-21 NOTE — Progress Notes (Signed)
Pls cosign for B12 inj../lmb  

## 2020-03-01 ENCOUNTER — Ambulatory Visit
Admission: RE | Admit: 2020-03-01 | Discharge: 2020-03-01 | Disposition: A | Discharge status: Home / Self Care | Payer: Medicare Other | Source: Ambulatory Visit | Attending: Nephrology | Admitting: Nephrology

## 2020-03-01 DIAGNOSIS — N183 Chronic kidney disease, stage 3 unspecified: Secondary | ICD-10-CM | POA: Diagnosis not present

## 2020-03-01 DIAGNOSIS — N1832 Chronic kidney disease, stage 3b: Secondary | ICD-10-CM

## 2020-03-01 DIAGNOSIS — R809 Proteinuria, unspecified: Secondary | ICD-10-CM | POA: Diagnosis not present

## 2020-03-25 ENCOUNTER — Telehealth: Payer: Medicare Other

## 2020-03-25 ENCOUNTER — Other Ambulatory Visit: Payer: Self-pay

## 2020-03-25 ENCOUNTER — Ambulatory Visit: Payer: Medicare Other | Admitting: Pharmacist

## 2020-03-25 DIAGNOSIS — K219 Gastro-esophageal reflux disease without esophagitis: Secondary | ICD-10-CM

## 2020-03-25 DIAGNOSIS — E1142 Type 2 diabetes mellitus with diabetic polyneuropathy: Secondary | ICD-10-CM

## 2020-03-25 DIAGNOSIS — I10 Essential (primary) hypertension: Secondary | ICD-10-CM

## 2020-03-25 DIAGNOSIS — E785 Hyperlipidemia, unspecified: Secondary | ICD-10-CM

## 2020-03-25 NOTE — Patient Instructions (Addendum)
Visit Information  Phone number for Pharmacist: [phone]  Goals Addressed            This Visit's Progress   . Pharmacy Care Plan       CARE PLAN ENTRY  Current Barriers:  . Chronic Disease Management support, education, and care coordination needs related to Hypertension, Hyperlipidemia, Diabetes, and GERD   Hypertension BP Readings from Last 3 Encounters:  12/12/19 128/62  12/09/19 139/64  11/28/19 (!) 136/58 .  Pharmacist Clinical Goal(s): o Over the next 180 days, patient will work with PharmD and providers to maintain BP goal <130/80 . Current regimen:  o Doxazosin 4 mg - 1/2 tablet at bedtime . Interventions: o Discussed benefits of taking BP medications at night . Patient self care activities - Over the next 180 days, patient will: o Check BP as needed, document, and provide at future appointments o Ensure daily salt intake < 2300 mg/day o Move blood pressure medications to bedtime  Hyperlipidemia Lab Results  Component Value Date/Time   LDLCALC 66 04/20/2019 11:02 AM .  Pharmacist Clinical Goal(s): o Over the next 180 days, patient will work with PharmD and providers to maintain LDL goal < 70 . Current regimen:  o Atorvastatin 80 mg daily . Interventions: o Discussed cholesterol goals and benefits of atorvastatin for prevention of heart attack and stroke . Patient self care activities - Over the next 180 days, patient will: o Continue medication as prescribed o Continue low cholesterol diet  Diabetes Lab Results  Component Value Date/Time   HGBA1C 7.3 (H) 01/22/2020 10:39 AM   HGBA1C 6.9 (H) 11/27/2019 10:38 AM .  Pharmacist Clinical Goal(s): o Over the next 180 days, patient will work with PharmD and providers to maintain A1c goal <8% . Current regimen:  o Glipizide XL 10 mg daily . Interventions: o Discussed A1c goal and benefits of good blood sugar control . Patient self care activities - Over the next 90 days, patient will: o Contact provider  with any episodes of hypoglycemia (low blood sugar) o Continue medication as prescribed  GERD / Heartburn . Pharmacist Clinical Goal(s) o Over the next 180 days, patient will work with PharmD and providers to determine necessity of medications . Current regimen:  o No medications . Interventions: o Trial off pantoprazole was successful without return of significant GERD symptoms . Patient self care activities - Over the next 180 days, patient will: o If heartburn returns, may start back on pantoprazole at any time  Medication management . Pharmacist Clinical Goal(s): o Over the next 180 days, patient will work with PharmD and providers to maintain optimal medication adherence . Current pharmacy: [facility] mail order . Interventions o Comprehensive medication review performed. o Continue current medication management strategy . Patient self care activities - Over the next 180 days, patient will: o Focus on medication adherence by pill box o Take medications as prescribed o Report any questions or concerns to PharmD and/or provider(s)  Please see past updates related to this goal by clicking on the "Past Updates" button in the selected goal       Patient verbalizes understanding of instructions provided today.  Telephone follow up appointment with pharmacy team member scheduled for: 6 months  Charlene Brooke, PharmD, BCACP Clinical Pharmacist [facility] When your kidneys are not working well, they cannot remove waste and excess substances from your blood as effectively as they did before. This can lead to  a buildup and imbalance of these substances, which can worsen kidney damage and affect how your body functions. Certain foods lead to a buildup of these substances in the body. By changing your diet as recommended by your diet and nutrition specialist (dietitian) or health care provider, you could  help prevent further kidney damage and delay or prevent the need for dialysis. What are tips for following this plan? General instructions   Work with your health care provider and dietitian to develop a meal plan that is right for you. Foods you can eat, limit, or avoid will be different for each person depending on the stage of kidney disease and any other existing health conditions.  Talk with your health care provider about whether you should take a vitamin and mineral supplement.  Use standard measuring cups and spoons to measure servings of foods. Use a kitchen scale to measure portions of protein foods.  If directed by your health care provider, avoid drinking too much fluid. Measure and count all liquids, including water, ice, soups, flavored gelatin, and frozen desserts such as popsicles or ice cream. Reading food labels  Check the amount of sodium in foods. Choose foods that have less than 300 milligrams (mg) per serving.  Check the ingredient list for phosphorus or potassium-based additives or preservatives.  Check the amount of saturated and trans fat. Limit or avoid these fats as told by your dietitian. Shopping  Avoid buying foods that are: ? Processed, frozen, or prepackaged. ? Calcium-enriched or fortified.  Do not buy foods that have salt or sodium listed among the first five ingredients.  Do not buy canned vegetables. Cooking  Replace animal proteins, such as meat, fish, eggs, or dairy, with plant proteins from beans, nuts, and soy. ? Use soy milk instead of cow's milk. ? Add beans or tofu to soups, casseroles, or pasta dishes instead of meat.  Soak vegetables, such as potatoes, before cooking to reduce potassium. To do this: ? Peel and cut into small pieces. ? Soak in warm water for at least 2 hours. For every 1 cup of vegetables, use 10 cups of water. ? Drain and rinse with warm water. ? Boil for at least 5 minutes. Meal planning  Limit the amount of  protein from plant and animal sources you eat each day.  Do not add salt to food when cooking or before eating.  Eat meals and snacks at around the same time each day. If you have diabetes:  If you have diabetes (diabetes mellitus) and chronic kidney disease, it is important to keep your blood glucose in the target range recommended by your health care provider. Follow your diabetes management plan. This may include: ? Checking your blood glucose regularly. ? Taking oral medicines, insulin, or both. ? Exercising for at least 30 minutes on 5 or more days each week, or as told by your health care provider. ? Tracking how many servings of carbohydrates you eat at each meal.  You may be given specific guidelines on how much of certain foods and nutrients you may eat, depending on your stage of kidney disease and whether you have high blood pressure (hypertension). Follow your meal plan as told by your dietitian. What nutrients should be limited? The items listed are not a complete list. Talk with your dietitian about what dietary choices are best for you. Potassium Potassium affects how steadily your heart beats. If too much potassium builds up in your blood, it can cause an irregular heartbeat or  even a heart attack. You may need to eat less potassium, depending on your blood potassium levels and the stage of kidney disease. Talk to your dietitian about how much potassium you may have each day. You may need to limit or avoid foods that are high in potassium, such as:  Milk and soy milk.  Fruits, such as bananas, papaya, apricots, nectarines, melon, prunes, raisins, kiwi, and oranges.  Vegetables, such as potatoes, sweet potatoes, yams, tomatoes, leafy greens, beets, okra, avocado, pumpkin, and winter squash.  White and lima beans. Phosphorus Phosphorus is a mineral found in your bones. A balance between calcium and phosphorous is needed to build and maintain healthy bones. Too much  phosphorus pulls calcium from your bones. This can make your bones weak and more likely to break. Too much phosphorus can also make your skin itch. You may need to eat less phosphorus depending on your blood phosphorus levels and the stage of kidney disease. Talk to your dietitian about how much potassium you may have each day. You may need to take medicine to lower your blood phosphorus levels if diet changes do not help. You may need to limit or avoid foods that are high in phosphorus, such as:  Milk and dairy products.  Dried beans and peas.  Tofu, soy milk, and other soy-based meat replacements.  Colas.  Nuts and peanut butter.  Meat, poultry, and fish.  Bran cereals and oatmeals. Protein Protein helps you to make and keep muscle. It also helps in the repair of your body's cells and tissues. One of the natural breakdown products of protein is a waste product called urea. When your kidneys are not working properly, they cannot remove wastes, such as urea, like they did before you developed chronic kidney disease. Reducing how much protein you eat can help prevent a buildup of urea in your blood. Depending on your stage of kidney disease, you may need to limit foods that are high in protein. Sources of animal protein include:  Meat (all types).  Fish and seafood.  Poultry.  Eggs.  Dairy. Other protein foods include:  Beans and legumes.  Nuts and nut butter.  Soy and tofu. Sodium Sodium, which is found in salt, helps maintain a healthy balance of fluids in your body. Too much sodium can increase your blood pressure and have a negative effect on the function of your heart and lungs. Too much sodium can also cause your body to retain too much fluid, making your kidneys work harder. Most people should have less than 2,300 milligrams (mg) of sodium each day. If you have hypertension, you may need to limit your sodium to 1,500 mg each day. Talk to your dietitian about how much  sodium you may have each day. You may need to limit or avoid foods that are high in sodium, such as:  Salt seasonings.  Soy sauce.  Cured and processed meats.  Salted crackers and snack foods.  Fast food.  Canned soups and most canned foods.  Pickled foods.  Vegetable juice.  Boxed mixes or ready-to-eat boxed meals and side dishes.  Bottled dressings, sauces, and marinades. Summary  Chronic kidney disease can lead to a buildup and imbalance of waste and excess substances in the body. Certain foods lead to a buildup of these substances. By adjusting your intake of these foods, you could help prevent more kidney damage and delay or prevent the need for dialysis.  Food adjustments are different for each person with chronic kidney disease.  Work with a dietitian to set up nutrient goals and a meal plan that is right for you.  If you have diabetes and chronic kidney disease, it is important to keep your blood glucose in the target range recommended by your health care provider. This information is not intended to replace advice given to you by your health care provider. Make sure you discuss any questions you have with your health care provider. Document Revised: 10/20/2018 Document Reviewed: 06/24/2016 Elsevier Patient Education  2020 [employer].

## 2020-03-25 NOTE — Chronic Care Management (AMB) (Signed)
Chronic Care Management Pharmacy  Name: Frank [name]  MRN: [id number] DOB: 1940-12-01   Chief Complaint/ HPI  Frank [name],  78 y.o. , male presents for their Follow-Up CCM visit with the clinical pharmacist via telephone due to COVID-19 Pandemic.  PCP : Janith Lima, MD  Their chronic conditions include: Hypertension, Hyperlipidemia, Diabetes, GERD, Chronic Kidney Disease, Depression, Overactive Bladder, BPH and Dementia, Allergies   Born and raised in [place], lived in [place] since 1966. Manufacturers representative for work. Married in 1963. Reads Pharmacist Daily.   Office Visits 01/30/20 Dr Ronnald Ramp OV: f/u after ED visit for fall/laceration. Healing well, no changes. 01/22/20 Dr Ronnald Ramp OV: switch oxybutynin to Vesicare. 01/02/20 pt message: stop amlodipine due to low BP 115/55. 12/31/19 pt message: referred to nephrologist at patient's request. 12/12/19 Dr Ronnald Ramp OV: ED f/u for weakness. Recommend to stop metformin d/t renal function. 11/27/19 Dr Ronnald Ramp OV: gradual memory decline, chronic postnasal drip. Added nasal antihistamine.  Consult Visit: 01/28/20 ED visit: scalp laceration d/t mechanical fall. 01/18/20 Dr Moshe Cipro (nephrology): stop telmisartan 12/08/19 ED visit: general weakness after being in sun at [employer]. Slight AKI d/t dehydration. 11/01/19 Dr Paulla Dolly (podiatry): DM foot exam.  Allergies  Allergen Reactions  . Lisinopril Cough  . Penicillins     REACTION: unspecified  Allergy as a child.  Patient does not remember reaction.  Has had cephalasporins without problems.  . Sulfamethoxazole     REACTION: as child    Medications: Outpatient Encounter Medications as of 03/25/2020  Medication Sig  . atorvastatin (LIPITOR) 20 MG tablet TAKE ONE-HALF TABLET BY  MOUTH DAILY  . azelastine (ASTELIN) 0.1 % nasal spray Place 1 spray into both nostrils 2 (two) times daily. Use in each nostril as directed  . Carbonyl Iron 25 MG TABS Take 25 mg by mouth  daily.  . Cholecalciferol (VITAMIN D3) 50 MCG (2000 UT) TABS Take 1 tablet by mouth once daily  . doxazosin (CARDURA) 4 MG tablet TAKE ONE-HALF TABLET BY  MOUTH AT BEDTIME  . FLUoxetine (PROZAC) 20 MG capsule TAKE 1 CAPSULE BY MOUTH  DAILY  . fluticasone (FLONASE) 50 MCG/ACT nasal spray Place 1 spray into both nostrils daily as needed for allergies or rhinitis.  [name] glipiZIDE (GLUCOTROL XL) 10 MG 24 hr tablet TAKE 1 TABLET BY MOUTH  DAILY  . levocetirizine (XYZAL) 5 MG tablet Take 1 tablet (5 mg total) by mouth every evening.  . solifenacin (VESICARE) 10 MG tablet Take 1 tablet (10 mg total) by mouth daily.  [name] triamcinolone cream (KENALOG) 0.1 %   . [DISCONTINUED] amLODipine (NORVASC) 10 MG tablet TAKE 1 TABLET BY MOUTH  DAILY  . [DISCONTINUED] atorvastatin (LIPITOR) 20 MG tablet TAKE ONE-HALF TABLET BY  MOUTH DAILY  . [DISCONTINUED] Cholecalciferol (VITAMIN D3) 50 MCG (2000 UT) TABS Take 1 tablet by mouth once daily  . [DISCONTINUED] doxazosin (CARDURA) 4 MG tablet TAKE ONE-HALF TABLET BY  MOUTH AT BEDTIME  . [DISCONTINUED] telmisartan (MICARDIS) 80 MG tablet TAKE 1 TABLET BY MOUTH  DAILY   No facility-administered encounter medications on file as of 03/25/2020.   Wt Readings from Last 3 Encounters:  01/30/20 217 lb (98.4 kg)  01/28/20 216 lb (98 kg)  01/22/20 215 lb 6 oz (97.7 kg)    Current Diagnosis/Assessment:    Goals Addressed            This Visit's Progress   . Pharmacy Care Plan       CARE PLAN ENTRY  Current  Barriers:  . Chronic Disease Management support, education, and care coordination needs related to Hypertension, Hyperlipidemia, Diabetes, and GERD   Hypertension BP Readings from Last 3 Encounters:  12/12/19 128/62  12/09/19 139/64  11/28/19 (!) 136/58 .  Pharmacist Clinical Goal(s): o Over the next 180 days, patient will work with PharmD and providers to maintain BP goal <130/80 . Current regimen:  o Doxazosin 4 mg - 1/2 tablet at  bedtime . Interventions: o Discussed benefits of taking BP medications at night . Patient self care activities - Over the next 180 days, patient will: o Check BP as needed, document, and provide at future appointments o Ensure daily salt intake < 2300 mg/day o Move blood pressure medications to bedtime  Hyperlipidemia Lab Results  Component Value Date/Time   LDLCALC 66 04/20/2019 11:02 AM .  Pharmacist Clinical Goal(s): o Over the next 180 days, patient will work with PharmD and providers to maintain LDL goal < 70 . Current regimen:  o Atorvastatin 80 mg daily . Interventions: o Discussed cholesterol goals and benefits of atorvastatin for prevention of heart attack and stroke . Patient self care activities - Over the next 180 days, patient will: o Continue medication as prescribed o Continue low cholesterol diet  Diabetes Lab Results  Component Value Date/Time   HGBA1C 7.3 (H) 01/22/2020 10:39 AM   HGBA1C 6.9 (H) 11/27/2019 10:38 AM .  Pharmacist Clinical Goal(s): o Over the next 180 days, patient will work with PharmD and providers to maintain A1c goal <8% . Current regimen:  o Glipizide XL 10 mg daily . Interventions: o Discussed A1c goal and benefits of good blood sugar control . Patient self care activities - Over the next 90 days, patient will: o Contact provider with any episodes of hypoglycemia (low blood sugar) o Continue medication as prescribed  GERD / Heartburn . Pharmacist Clinical Goal(s) o Over the next 180 days, patient will work with PharmD and providers to determine necessity of medications . Current regimen:  o No medications . Interventions: o Trial off pantoprazole was successful without return of significant GERD symptoms . Patient self care activities - Over the next 180 days, patient will: o If heartburn returns, may start back on pantoprazole at any time  Medication management . Pharmacist Clinical Goal(s): o Over the next 180 days, patient  will work with PharmD and providers to maintain optimal medication adherence . Current pharmacy: [facility] mail order . Interventions o Comprehensive medication review performed. o Continue current medication management strategy . Patient self care activities - Over the next 180 days, patient will: o Focus on medication adherence by pill box o Take medications as prescribed o Report any questions or concerns to PharmD and/or provider(s)  Please see past updates related to this goal by clicking on the "Past Updates" button in the selected goal        Hypertension   BP goal is:  <130/80  Office blood pressures are  BP Readings from Last 3 Encounters:  01/30/20 (!) 160/80  01/28/20 (!) 148/88  01/22/20 (!) 164/88   Kidney Function Lab Results  Component Value Date/Time   CREATININE 1.93 (H) 12/12/2019 02:50 PM   CREATININE 2.30 (H) 12/08/2019 10:50 PM   GFR 33.56 (L) 12/12/2019 02:50 PM   GFRNONAA 26 (L) 12/08/2019 10:50 PM   GFRAA 30 (L) 12/08/2019 10:50 PM   Patient checks BP at home 1-2x per week Patient home BP readings are ranging: 120s-130s/60s-70s  Patient has failed these meds in the past:  n/a Patient is currently controlled on the following medications:  . Doxazosin 4 mg - 1/2 tablet HS  We discussed: pt has stopped amlodipine and telmisartan at request of PCP and nephrologist; pt has PCP appt this week to recheck labs; pt reports nephrologist is considering restarting telmisartan but has deferred to PCP  Plan  Continue current medications and control with diet and exercise  F/U with PCP as scheduled  Hyperlipidemia   LDL goal < 70  Lipid Panel     Component Value Date/Time   CHOL 130 04/20/2019 1102   TRIG 89.0 04/20/2019 1102   HDL 46.10 04/20/2019 1102   [place] 66 04/20/2019 1102    The ASCVD Risk score (Goff DC Jr., et al., 2013) failed to calculate for the following reasons:   The 2013 ASCVD risk score is only valid for ages 43 to 80   Patient  has failed these meds in past: n/a Patient is currently controlled on the following medications:  . Atorvastatin 80 mg daily  We discussed:  diet and exercise extensively; cholesterol goals; benefits of statin for ASCVD prevention  Plan  Continue current medications and control with diet and exercise  Diabetes   A1c goal < 8%  Recent Relevant Labs: Lab Results  Component Value Date/Time   HGBA1C 7.3 (H) 01/22/2020 10:39 AM   HGBA1C 6.9 (H) 11/27/2019 10:38 AM   GFR 33.56 (L) 12/12/2019 02:50 PM   GFR 34.39 (L) 11/27/2019 10:38 AM   MICROALBUR 10.9 (H) 11/27/2019 10:38 AM   MICROALBUR 7.9 (H) 11/21/2018 12:12 PM    Last diabetic Eye exam:  Lab Results  Component Value Date/Time   HMDIABEYEEXA No Retinopathy 10/10/2019 12:00 AM    Last diabetic Foot exam:  Lab Results  Component Value Date/Time   HMDIABFOOTEX done-podiatry 11/01/2013 12:00 AM    Checking BG: Never  Patient has failed these meds in past: Invokamet, Januvia, metformin, Tradjenta, pioglitazone Patient is currently controlled on the following medications: [name] Glipizide XL 10 mg daily  We discussed: A1c increased slightly after stopping metformin; A1c goal < 8% is reasonable given age, comorbidities, fall risk. Additional therapy is not warranted at this time.  Plan  Continue current medications and control with diet and exercise   Depression   Depression screen [facility] 2/9 11/27/2019 11/08/2019 04/20/2019  Decreased Interest 0 0 0  Down, Depressed, Hopeless 0 0 0  PHQ - 2 Score 0 0 0  Altered sleeping 0 - 0  Tired, decreased energy 0 - 0  Change in appetite 0 - 0  Feeling bad or failure about yourself  0 - 0  Trouble concentrating 0 - 0  Moving slowly or fidgety/restless 0 - 0  Suicidal thoughts 0 - 0  PHQ-9 Score 0 - 0  Difficult doing work/chores Not difficult at all - Not difficult at all  Some recent data might be hidden   Patient has failed these meds in past: n/a Patient is currently controlled  on the following medications:  . Fluoxetine 20 mg daily  We discussed:  Pt denies issues with mood, doing well with current dose.  Plan  Continue current medications  GERD   Patient has failed these meds in past: pantoprazole Patient is currently controlled on the following medications:  . No medications  We discussed: Pt denies issues with heartburns since stopping pantoprazole. If he does experience worsening symptoms he may restarted PPI at any time.  Plan  Continue to monitor symptoms   Allergies   Patient  has failed these meds in past: Azelastine (cost) Patient is currently controlled on the following medications:  . Flonase nasal spray PRN . Levocetirizine 5 mg HS  We discussed:  Patient is satisfied with current OTC regimen and denies issues  Plan  Continue current medications  BPH/OAB   Lab Results  Component Value Date/Time   PSA 1.08 07/10/2015 09:16 AM   PSA 1.17 04/06/2014 09:02 AM   Patient has failed these meds in past: n/a Patient is currently controlled on the following medications:  . Doxazosin 4 mg - 1/2 tab HS . Oxybutynin 5 mg BID . Solifenacin 10 mg daily  We discussed:  Pt has not noticed improvement in frequency of urination since started solifenacin in addition to oxybutynin. He denies side effects  Plan  Continue current medications  B12 Deficiency Anemia   Lab Results  Component Value Date/Time   VITAMINB12 1,104 (H) 11/30/2018 02:44 PM   CBC Latest Ref Rng & Units 01/22/2020 12/08/2019 11/27/2019  WBC 3.8 - 10.8 Thousand/uL 5.8 7.3 8.7  Hemoglobin 13.2 - 17.1 g/dL 11.2(L) 10.9(L) 10.4(L)  Hematocrit 38 - 50 % 34.7(L) 34.5(L) 31.6(L)  Platelets 140 - 400 Thousand/uL 154 163 168.0   Patient has failed these meds in past: n/a Patient is currently controlled on the following medications:  . Cyanocobalamin 1000 mg injection  We discussed:  Pt has not noticed an improvement in energy since starting B12 injections; discussed  benefits for anemia.  Plan  Continue current medications   Medication Management   Pt uses [facility] for all medications Uses pill box? Yes Pt endorses 100% compliance  We discussed: All medications are $0 copay through mail order.   Plan  Continue current medication management strategy    Follow up: 6 month phone visit  Charlene Brooke, PharmD, [facility] Clinical Pharmacist [facility] [phone]

## 2020-03-26 DIAGNOSIS — G4733 Obstructive sleep apnea (adult) (pediatric): Secondary | ICD-10-CM | POA: Diagnosis not present

## 2020-03-27 ENCOUNTER — Other Ambulatory Visit: Payer: Self-pay

## 2020-03-27 ENCOUNTER — Ambulatory Visit (INDEPENDENT_AMBULATORY_CARE_PROVIDER_SITE_OTHER): Payer: Medicare Other | Admitting: Internal Medicine

## 2020-03-27 ENCOUNTER — Ambulatory Visit: Payer: Medicare Other

## 2020-03-27 ENCOUNTER — Encounter: Payer: Self-pay | Admitting: Internal Medicine

## 2020-03-27 VITALS — BP 136/82 | HR 70 | Temp 98.4°F | Resp 16 | Ht 69.0 in | Wt 212.0 lb

## 2020-03-27 DIAGNOSIS — E1142 Type 2 diabetes mellitus with diabetic polyneuropathy: Secondary | ICD-10-CM | POA: Diagnosis not present

## 2020-03-27 DIAGNOSIS — Z23 Encounter for immunization: Secondary | ICD-10-CM | POA: Diagnosis not present

## 2020-03-27 DIAGNOSIS — D510 Vitamin B12 deficiency anemia due to intrinsic factor deficiency: Secondary | ICD-10-CM | POA: Diagnosis not present

## 2020-03-27 LAB — POCT GLYCOSYLATED HEMOGLOBIN (HGB A1C): Hemoglobin A1C: 7.5 % — AB (ref 4.0–5.6)

## 2020-03-27 MED ORDER — CYANOCOBALAMIN 1000 MCG/ML IJ SOLN
1000.0000 ug | Freq: Once | INTRAMUSCULAR | Status: AC
  Administered 2020-03-27: 1000 ug via INTRAMUSCULAR

## 2020-03-27 NOTE — Progress Notes (Signed)
Subjective:  Patient ID: Frank [name], male    DOB: 27-Apr-1941  Age: 79 y.o. MRN: [id number]  CC: Diabetes  This visit occurred during the SARS-CoV-2 public health emergency.  Safety protocols were in place, including screening questions prior to the visit, additional usage of staff PPE, and extensive cleaning of exam room while observing appropriate contact time as indicated for disinfecting solutions.    HPI Frank [name] presents for f/up - He complains of frequent urination but otherwise feels well and offers no complaints.  He does not monitor his blood sugar.    Outpatient Medications Prior to Visit  Medication Sig Dispense Refill  . atorvastatin (LIPITOR) 20 MG tablet TAKE ONE-HALF TABLET BY  MOUTH DAILY 45 tablet 1  . azelastine (ASTELIN) 0.1 % nasal spray Place 1 spray into both nostrils 2 (two) times daily. Use in each nostril as directed 90 mL 1  . Carbonyl Iron 25 MG TABS Take 25 mg by mouth daily.    . Cholecalciferol (VITAMIN D3) 50 MCG (2000 UT) TABS Take 1 tablet by mouth once daily 90 tablet 1  . doxazosin (CARDURA) 4 MG tablet TAKE ONE-HALF TABLET BY  MOUTH AT BEDTIME 45 tablet 1  . FLUoxetine (PROZAC) 20 MG capsule TAKE 1 CAPSULE BY MOUTH  DAILY 90 capsule 1  . fluticasone (FLONASE) 50 MCG/ACT nasal spray Place 1 spray into both nostrils daily as needed for allergies or rhinitis.    [name] glipiZIDE (GLUCOTROL XL) 10 MG 24 hr tablet TAKE 1 TABLET BY MOUTH  DAILY 90 tablet 1  . levocetirizine (XYZAL) 5 MG tablet Take 1 tablet (5 mg total) by mouth every evening. 90 tablet 1  . solifenacin (VESICARE) 10 MG tablet Take 1 tablet (10 mg total) by mouth daily. 90 tablet 1  . triamcinolone cream (KENALOG) 0.1 %      No facility-administered medications prior to visit.    ROS Review of Systems  Constitutional: Negative for appetite change, diaphoresis and fatigue.  HENT: Negative.  Negative for sore throat and trouble swallowing.   Eyes: Negative for visual  disturbance.  Respiratory: Negative for cough, chest tightness, shortness of breath and wheezing.   Cardiovascular: Negative for chest pain, palpitations and leg swelling.  Gastrointestinal: Negative for abdominal pain, diarrhea, nausea and vomiting.  Endocrine: Positive for polyuria. Negative for polydipsia and polyphagia.  Genitourinary: Negative.  Negative for difficulty urinating.  Musculoskeletal: Positive for gait problem. Negative for arthralgias and myalgias.  Skin: Negative.  Negative for color change.  Neurological: Negative for dizziness, weakness and light-headedness.  Hematological: Negative for adenopathy. Does not bruise/bleed easily.  Psychiatric/Behavioral: Negative.     Objective:  BP 136/82   Pulse 70   Temp 98.4 F (36.9 C) (Oral)   Resp 16   Ht 5\' 9"  (1.753 m)   Wt 212 lb (96.2 kg)   SpO2 94%   BMI 31.31 kg/m   BP Readings from Last 3 Encounters:  03/27/20 136/82  01/30/20 (!) 160/80  01/28/20 (!) 148/88    Wt Readings from Last 3 Encounters:  03/27/20 212 lb (96.2 kg)  01/30/20 217 lb (98.4 kg)  01/28/20 216 lb (98 kg)    Physical Exam Vitals reviewed.  HENT:     Nose: Nose normal.     Mouth/Throat:     Mouth: Mucous membranes are moist.  Eyes:     General: No scleral icterus.    Conjunctiva/sclera: Conjunctivae normal.  Cardiovascular:     Rate and Rhythm: Normal  rate and regular rhythm.     Heart sounds: No murmur heard.   Pulmonary:     Effort: Pulmonary effort is normal.     Breath sounds: No stridor. No wheezing, rhonchi or rales.  Abdominal:     General: Abdomen is flat. Bowel sounds are normal. There is no distension.     Palpations: Abdomen is soft. There is no hepatomegaly or splenomegaly.  Musculoskeletal:        General: Normal range of motion.     Cervical back: Neck supple.     Right lower leg: No edema.     Left lower leg: No edema.  Lymphadenopathy:     Cervical: No cervical adenopathy.  Skin:    General: Skin is  warm and dry.  Neurological:     General: No focal deficit present.     Mental Status: He is alert.     Lab Results  Component Value Date   WBC 5.8 01/22/2020   HGB 11.2 (L) 01/22/2020   HCT 34.7 (L) 01/22/2020   PLT 154 01/22/2020   GLUCOSE 198 (H) 12/12/2019   CHOL 130 04/20/2019   TRIG 89.0 04/20/2019   HDL 46.10 04/20/2019   LDLCALC 66 04/20/2019   ALT 19 08/04/2017   AST 20 08/04/2017   NA 137 12/12/2019   K 4.6 12/12/2019   CL 101 12/12/2019   CREATININE 1.93 (H) 12/12/2019   BUN 40 (H) 12/12/2019   CO2 28 12/12/2019   TSH 2.02 05/25/2018   PSA 1.08 07/10/2015   INR 0.98 08/04/2017   HGBA1C 7.5 (A) 03/27/2020   MICROALBUR 10.9 (H) 11/27/2019    US RENAL  Result Date: 03/01/2020 CLINICAL DATA:  Stage III renal insufficiency, proteinuria EXAM: RENAL / URINARY TRACT ULTRASOUND COMPLETE COMPARISON:  None. FINDINGS: Right Kidney: Renal measurements: 10.5 x 5.6 x 6.9 cm = volume: 210 mL. There is increased renal cortical echotexture with mild renal cortical thinning. No hydronephrosis or nephrolithiasis. Left Kidney: Renal measurements: 11.2 x 6.1 x 6.0 cm = volume: 212 mL. There is increased renal cortical echotexture and mild renal cortical thinning. No hydronephrosis or nephrolithiasis. Bladder: Appears normal for degree of bladder distention. Other: None. IMPRESSION: 1. Bilateral renal cortical thinning and increased cortical echotexture consistent with medical renal disease. Electronically Signed   By: Frank [name] M.D.   On: 03/01/2020 21:20    Assessment & Plan:   Frank [name] was seen today for diabetes.  Diagnoses and all orders for this visit:  Flu vaccine need -     Flu Vaccine QUAD High Dose(Fluad)  Vitamin B12 deficiency anemia due to intrinsic factor deficiency -     cyanocobalamin ((VITAMIN B-12)) injection 1,000 mcg  Type 2 diabetes mellitus with peripheral neuropathy (Frank [name])- His A1c is at 7.5%.  His blood sugars are adequately well controlled.  Will  continue monotherapy with the sulfonylurea. -     POCT glycosylated hemoglobin (Hb A1C)   I am having Frank Frank L. Frank [name] "ken" maintain his Carbonyl Iron, fluticasone, triamcinolone cream, Vitamin D3, glipiZIDE, FLUoxetine, azelastine, levocetirizine, atorvastatin, doxazosin, and solifenacin. We administered cyanocobalamin.  Meds ordered this encounter  Medications  . cyanocobalamin ((VITAMIN B-12)) injection 1,000 mcg     Follow-up: No follow-ups on file.  Scarlette Calico, MD

## 2020-03-28 ENCOUNTER — Ambulatory Visit: Payer: Medicare Other | Admitting: Internal Medicine

## 2020-03-29 DIAGNOSIS — L570 Actinic keratosis: Secondary | ICD-10-CM | POA: Diagnosis not present

## 2020-03-29 DIAGNOSIS — L821 Other seborrheic keratosis: Secondary | ICD-10-CM | POA: Diagnosis not present

## 2020-03-29 DIAGNOSIS — D0422 Carcinoma in situ of skin of left ear and external auricular canal: Secondary | ICD-10-CM | POA: Diagnosis not present

## 2020-03-29 DIAGNOSIS — Z85828 Personal history of other malignant neoplasm of skin: Secondary | ICD-10-CM | POA: Diagnosis not present

## 2020-03-29 DIAGNOSIS — L814 Other melanin hyperpigmentation: Secondary | ICD-10-CM | POA: Diagnosis not present

## 2020-04-02 ENCOUNTER — Encounter: Payer: Self-pay | Admitting: Internal Medicine

## 2020-04-08 ENCOUNTER — Other Ambulatory Visit: Payer: Self-pay | Admitting: Internal Medicine

## 2020-04-08 DIAGNOSIS — E1142 Type 2 diabetes mellitus with diabetic polyneuropathy: Secondary | ICD-10-CM

## 2020-04-08 MED ORDER — TOUJEO MAX SOLOSTAR 300 UNIT/ML ~~LOC~~ SOPN: 10.0000 [IU] | PEN_INJECTOR | Freq: Every day | SUBCUTANEOUS | 1 refills | Status: DC

## 2020-04-08 MED ORDER — INSULIN PEN NEEDLE 32G X 6 MM MISC: 1.0000 | Freq: Every day | 1 refills | Status: AC

## 2020-04-09 ENCOUNTER — Ambulatory Visit: Payer: Medicare Other | Admitting: Internal Medicine

## 2020-04-09 ENCOUNTER — Other Ambulatory Visit: Payer: Self-pay | Admitting: Internal Medicine

## 2020-04-09 DIAGNOSIS — E119 Type 2 diabetes mellitus without complications: Secondary | ICD-10-CM

## 2020-04-09 DIAGNOSIS — E1142 Type 2 diabetes mellitus with diabetic polyneuropathy: Secondary | ICD-10-CM

## 2020-04-10 ENCOUNTER — Other Ambulatory Visit: Payer: Self-pay | Admitting: Internal Medicine

## 2020-04-10 ENCOUNTER — Telehealth: Payer: Self-pay | Admitting: Internal Medicine

## 2020-04-10 DIAGNOSIS — E119 Type 2 diabetes mellitus without complications: Secondary | ICD-10-CM

## 2020-04-10 DIAGNOSIS — E1142 Type 2 diabetes mellitus with diabetic polyneuropathy: Secondary | ICD-10-CM

## 2020-04-10 DIAGNOSIS — F3342 Major depressive disorder, recurrent, in full remission: Secondary | ICD-10-CM

## 2020-04-10 DIAGNOSIS — N3281 Overactive bladder: Secondary | ICD-10-CM

## 2020-04-10 MED ORDER — SOLIFENACIN SUCCINATE 10 MG PO TABS: 10.0000 mg | ORAL_TABLET | Freq: Every day | ORAL | 1 refills | Status: DC

## 2020-04-10 MED ORDER — FLUOXETINE HCL 20 MG PO CAPS: 20.0000 mg | ORAL_CAPSULE | Freq: Every day | ORAL | 1 refills | Status: DC

## 2020-04-10 MED ORDER — GLIPIZIDE ER 10 MG PO TB24: 10.0000 mg | ORAL_TABLET | Freq: Every day | ORAL | 1 refills | Status: DC

## 2020-04-10 NOTE — Telephone Encounter (Signed)
Patient called and said that he got a call from [facility] called and said that one of his medications (he does not know the name) said it was for over active bladder, they told him that it is $83. Please call the patient back [phone]

## 2020-04-10 NOTE — Telephone Encounter (Signed)
Patient called and said that he got a call from [facility] in regards to a medication for over active bladder. Patient said he does not know the name of the medication but they told him it was $83. Please call the patient back at [phone]

## 2020-04-16 ENCOUNTER — Other Ambulatory Visit: Payer: Self-pay | Admitting: Internal Medicine

## 2020-04-16 DIAGNOSIS — G4733 Obstructive sleep apnea (adult) (pediatric): Secondary | ICD-10-CM | POA: Diagnosis not present

## 2020-04-16 DIAGNOSIS — E1142 Type 2 diabetes mellitus with diabetic polyneuropathy: Secondary | ICD-10-CM

## 2020-04-16 MED ORDER — TOUJEO MAX SOLOSTAR 300 UNIT/ML ~~LOC~~ SOPN: 10.0000 [IU] | PEN_INJECTOR | Freq: Every day | SUBCUTANEOUS | 1 refills | Status: DC

## 2020-04-23 DIAGNOSIS — E1142 Type 2 diabetes mellitus with diabetic polyneuropathy: Secondary | ICD-10-CM | POA: Diagnosis not present

## 2020-04-23 DIAGNOSIS — I129 Hypertensive chronic kidney disease with stage 1 through stage 4 chronic kidney disease, or unspecified chronic kidney disease: Secondary | ICD-10-CM | POA: Diagnosis not present

## 2020-04-23 DIAGNOSIS — E785 Hyperlipidemia, unspecified: Secondary | ICD-10-CM | POA: Diagnosis not present

## 2020-04-23 DIAGNOSIS — N1832 Chronic kidney disease, stage 3b: Secondary | ICD-10-CM | POA: Diagnosis not present

## 2020-04-23 DIAGNOSIS — N3281 Overactive bladder: Secondary | ICD-10-CM | POA: Diagnosis not present

## 2020-04-23 LAB — BASIC METABOLIC PANEL
BUN: 27 — AB (ref 4–21)
CO2: 29 — AB (ref 13–22)
Chloride: 103 (ref 99–108)
Creatinine: 1.5 — AB (ref 0.6–1.3)
Glucose: 143
Potassium: 4.6 (ref 3.4–5.3)
Sodium: 139 (ref 137–147)

## 2020-04-23 LAB — MICROALBUMIN, URINE: Microalb, Ur: 67.2

## 2020-04-25 ENCOUNTER — Other Ambulatory Visit: Payer: Self-pay | Admitting: Internal Medicine

## 2020-04-30 ENCOUNTER — Ambulatory Visit (INDEPENDENT_AMBULATORY_CARE_PROVIDER_SITE_OTHER): Payer: Medicare Other | Admitting: Internal Medicine

## 2020-04-30 ENCOUNTER — Other Ambulatory Visit: Payer: Self-pay | Admitting: Internal Medicine

## 2020-04-30 ENCOUNTER — Other Ambulatory Visit: Payer: Self-pay

## 2020-04-30 ENCOUNTER — Encounter: Payer: Self-pay | Admitting: Internal Medicine

## 2020-04-30 VITALS — BP 136/70 | HR 71 | Temp 98.5°F | Resp 16 | Ht 69.0 in | Wt 213.0 lb

## 2020-04-30 DIAGNOSIS — E1142 Type 2 diabetes mellitus with diabetic polyneuropathy: Secondary | ICD-10-CM | POA: Diagnosis not present

## 2020-04-30 DIAGNOSIS — I10 Essential (primary) hypertension: Secondary | ICD-10-CM | POA: Diagnosis not present

## 2020-04-30 MED ORDER — GVOKE HYPOPEN 2-PACK 1 MG/0.2ML ~~LOC~~ SOAJ: 1.0000 | Freq: Every day | SUBCUTANEOUS | 5 refills | Status: DC | PRN

## 2020-04-30 NOTE — Patient Instructions (Signed)
Type 2 Diabetes Mellitus, Diagnosis, Adult Type 2 diabetes (type 2 diabetes mellitus) is a long-term (chronic) disease. In type 2 diabetes, one or both of these problems may be present:  The pancreas does not make enough of a hormone called insulin.  Cells in the body do not respond properly to insulin that the body makes (insulin resistance). Normally, insulin allows blood sugar (glucose) to enter cells in the body. The cells use glucose for energy. Insulin resistance or lack of insulin causes excess glucose to build up in the blood instead of going into cells. As a result, high blood glucose (hyperglycemia) develops. What increases the risk? The following factors may make you more likely to develop type 2 diabetes:  Having a family member with type 2 diabetes.  Being overweight or obese.  Having an inactive (sedentary) lifestyle.  Having been diagnosed with insulin resistance.  Having a history of prediabetes, gestational diabetes, or polycystic ovary syndrome (PCOS).  Being of American-Indian, African-American, Hispanic/Latino, or Asian/Pacific Islander descent. What are the signs or symptoms? In the early stage of this condition, you may not have symptoms. Symptoms develop slowly and may include:  Increased thirst (polydipsia).  Increased hunger(polyphagia).  Increased urination (polyuria).  Increased urination during the night (nocturia).  Unexplained weight loss.  Frequent infections that keep coming back (recurring).  Fatigue.  Weakness.  Vision changes, such as blurry vision.  Cuts or bruises that are slow to heal.  Tingling or numbness in the hands or feet.  Dark patches on the skin (acanthosis nigricans). How is this diagnosed? This condition is diagnosed based on your symptoms, your medical history, a physical exam, and your blood glucose level. Your blood glucose may be checked with one or more of the following blood tests:  A fasting blood glucose (FBG)  test. You will not be allowed to eat (you will fast) for 8 hours or longer before a blood sample is taken.  A random blood glucose test. This test checks blood glucose at any time of day regardless of when you ate.  An A1c (hemoglobin A1c) blood test. This test provides information about blood glucose control over the previous 2-3 months.  An oral glucose tolerance test (OGTT). This test measures your blood glucose at two times: ? After fasting. This is your baseline blood glucose level. ? Two hours after drinking a beverage that contains glucose. You may be diagnosed with type 2 diabetes if:  Your FBG level is 126 mg/dL (7.0 mmol/L) or higher.  Your random blood glucose level is 200 mg/dL (11.1 mmol/L) or higher.  Your A1c level is 6.5% or higher.  Your OGTT result is higher than 200 mg/dL (11.1 mmol/L). These blood tests may be repeated to confirm your diagnosis. How is this treated? Your treatment may be managed by a specialist called an endocrinologist. Type 2 diabetes may be treated by following instructions from your health care provider about:  Making diet and lifestyle changes. This may include: ? Following an individualized nutrition plan that is developed by a diet and nutrition specialist (registered dietitian). ? Exercising regularly. ? Finding ways to manage stress.  Checking your blood glucose level as often as told.  Taking diabetes medicines or insulin daily. This helps to keep your blood glucose levels in the healthy range. ? If you use insulin, you may need to adjust the dosage depending on how physically active you are and what foods you eat. Your health care provider will tell you how to adjust your dosage.    Taking medicines to help prevent complications from diabetes, such as: ? Aspirin. ? Medicine to lower cholesterol. ? Medicine to control blood pressure. Your health care provider will set individualized treatment goals for you. Your goals will be based on  your age, other medical conditions you have, and how you respond to diabetes treatment. Generally, the goal of treatment is to maintain the following blood glucose levels:  Before meals (preprandial): 80-130 mg/dL (4.4-7.2 mmol/L).  After meals (postprandial): below 180 mg/dL (10 mmol/L).  A1c level: less than 7%. Follow these instructions at home: Questions to ask your health care provider  Consider asking the following questions: ? Do I need to meet with a diabetes educator? ? Where can I find a support group for people with diabetes? ? What equipment will I need to manage my diabetes at home? ? What diabetes medicines do I need, and when should I take them? ? How often do I need to check my blood glucose? ? What number can I call if I have questions? ? When is my next appointment? General instructions  Take over-the-counter and prescription medicines only as told by your health care provider.  Keep all follow-up visits as told by your health care provider. This is important.  For more information about diabetes, visit: ? American Diabetes Association (ADA): www.diabetes.org ? American Association of Diabetes Educators (AADE): www.diabeteseducator.org Contact a health care provider if:  Your blood glucose is at or above 240 mg/dL (13.3 mmol/L) for 2 days in a row.  You have been sick or have had a fever for 2 days or longer, and you are not getting better.  You have any of the following problems for more than 6 hours: ? You cannot eat or drink. ? You have nausea and vomiting. ? You have diarrhea. Get help right away if:  Your blood glucose is lower than 54 mg/dL (3.0 mmol/L).  You become confused or you have trouble thinking clearly.  You have difficulty breathing.  You have moderate or large ketone levels in your urine. Summary  Type 2 diabetes (type 2 diabetes mellitus) is a long-term (chronic) disease. In type 2 diabetes, the pancreas does not make enough of a  hormone called insulin, or cells in the body do not respond properly to insulin that the body makes (insulin resistance).  This condition is treated by making diet and lifestyle changes and taking diabetes medicines or insulin.  Your health care provider will set individualized treatment goals for you. Your goals will be based on your age, other medical conditions you have, and how you respond to diabetes treatment.  Keep all follow-up visits as told by your health care provider. This is important. This information is not intended to replace advice given to you by your health care provider. Make sure you discuss any questions you have with your health care provider. Document Revised: 08/27/2017 Document Reviewed: 08/02/2015 Elsevier Patient Education  2020 [employer]  

## 2020-04-30 NOTE — Progress Notes (Signed)
Subjective:  Patient ID: Frank [name], male    DOB: 1941/02/10  Age: 79 y.o. MRN: [id number]  CC: Hypertension and Diabetes  This visit occurred during the SARS-CoV-2 public health emergency.  Safety protocols were in place, including screening questions prior to the visit, additional usage of staff PPE, and extensive cleaning of exam room while observing appropriate contact time as indicated for disinfecting solutions.    HPI Frank [name] presents for f/up - He started using basal insulin about a month ago.  He has noticed an improvement in his urinary frequency.  His blood sugars have trended down from the low 200s into the mid to low 100s.  His lowest blood sugar was about 5 days ago and it was 85.  He has rare episodes of hypoglycemic symptoms that are treated with eating candy.  Outpatient Medications Prior to Visit  Medication Sig Dispense Refill  . atorvastatin (LIPITOR) 20 MG tablet TAKE ONE-HALF TABLET BY  MOUTH DAILY 45 tablet 1  . azelastine (ASTELIN) 0.1 % nasal spray Place 1 spray into both nostrils 2 (two) times daily. Use in each nostril as directed 90 mL 1  . Carbonyl Iron 25 MG TABS Take 25 mg by mouth daily.    . Cholecalciferol (VITAMIN D3) 50 MCG (2000 UT) TABS Take 1 tablet by mouth once daily 90 tablet 1  . doxazosin (CARDURA) 4 MG tablet TAKE ONE-HALF TABLET BY  MOUTH AT BEDTIME 45 tablet 1  . FLUoxetine (PROZAC) 20 MG capsule Take 1 capsule (20 mg total) by mouth daily. 90 capsule 1  . fluticasone (FLONASE) 50 MCG/ACT nasal spray Place 1 spray into both nostrils daily as needed for allergies or rhinitis.    [name] glipiZIDE (GLUCOTROL XL) 10 MG 24 hr tablet Take 1 tablet (10 mg total) by mouth daily. 90 tablet 1  . insulin glargine, 2 Unit Dial, (TOUJEO MAX SOLOSTAR) 300 UNIT/ML Solostar Pen Inject 10 Units into the skin daily. 6 mL 1  . Insulin Pen Needle 32G X 6 MM MISC 1 Act by Does not apply route daily. 100 each 1  . levocetirizine (XYZAL) 5 MG  tablet Take 1 tablet (5 mg total) by mouth every evening. 90 tablet 1  . pantoprazole (PROTONIX) 40 MG tablet TAKE 1 TABLET BY MOUTH  DAILY 90 tablet 1  . solifenacin (VESICARE) 10 MG tablet Take 1 tablet (10 mg total) by mouth daily. 90 tablet 1  . triamcinolone cream (KENALOG) 0.1 %      No facility-administered medications prior to visit.    ROS Review of Systems  Constitutional: Negative for appetite change, diaphoresis and fatigue.  HENT: Negative.   Eyes: Negative for visual disturbance.  Respiratory: Negative for cough, chest tightness and wheezing.   Cardiovascular: Negative for chest pain, palpitations and leg swelling.  Gastrointestinal: Negative for abdominal pain, constipation, diarrhea, nausea and vomiting.  Endocrine: Positive for polyuria. Negative for polydipsia and polyphagia.  Genitourinary: Negative.  Negative for difficulty urinating, dysuria and frequency.  Musculoskeletal: Negative for arthralgias.  Skin: Negative.  Negative for color change and pallor.  Neurological: Negative.  Negative for headaches.  Hematological: Negative for adenopathy. Does not bruise/bleed easily.  Psychiatric/Behavioral: Negative.     Objective:  BP 136/70   Pulse 71   Temp 98.5 F (36.9 C) (Oral)   Resp 16   Ht 5\' 9"  (1.753 m)   Wt 213 lb (96.6 kg)   SpO2 95%   BMI 31.45 kg/m   BP Readings from  Last 3 Encounters:  04/30/20 136/70  03/27/20 136/82  01/30/20 (!) 160/80    Wt Readings from Last 3 Encounters:  04/30/20 213 lb (96.6 kg)  03/27/20 212 lb (96.2 kg)  01/30/20 217 lb (98.4 kg)    Physical Exam Vitals reviewed.  Constitutional:      Appearance: Normal appearance.  HENT:     Mouth/Throat:     Mouth: Mucous membranes are moist.  Eyes:     General: No scleral icterus.    Conjunctiva/sclera: Conjunctivae normal.  Cardiovascular:     Rate and Rhythm: Normal rate and regular rhythm.     Heart sounds: No murmur heard.   Pulmonary:     Effort: Pulmonary  effort is normal.     Breath sounds: No stridor. No wheezing, rhonchi or rales.  Abdominal:     General: Abdomen is flat. Bowel sounds are normal. There is no distension.     Palpations: Abdomen is soft. There is no hepatomegaly, splenomegaly or mass.  Musculoskeletal:        General: Normal range of motion.     Cervical back: Neck supple.     Right lower leg: No edema.     Left lower leg: No edema.  Lymphadenopathy:     Cervical: No cervical adenopathy.  Skin:    General: Skin is warm and dry.     Coloration: Skin is not pale.  Neurological:     General: No focal deficit present.     Mental Status: He is alert.  Psychiatric:        Mood and Affect: Mood normal.        Behavior: Behavior normal.     Lab Results  Component Value Date   WBC 5.8 01/22/2020   HGB 11.2 (L) 01/22/2020   HCT 34.7 (L) 01/22/2020   PLT 154 01/22/2020   GLUCOSE 198 (H) 12/12/2019   CHOL 130 04/20/2019   TRIG 89.0 04/20/2019   HDL 46.10 04/20/2019   LDLCALC 66 04/20/2019   ALT 19 08/04/2017   AST 20 08/04/2017   NA 137 12/12/2019   K 4.6 12/12/2019   CL 101 12/12/2019   CREATININE 1.93 (H) 12/12/2019   BUN 40 (H) 12/12/2019   CO2 28 12/12/2019   TSH 2.02 05/25/2018   PSA 1.08 07/10/2015   INR 0.98 08/04/2017   HGBA1C 7.5 (A) 03/27/2020   MICROALBUR 10.9 (H) 11/27/2019    US RENAL  Result Date: 03/01/2020 CLINICAL DATA:  Stage III renal insufficiency, proteinuria EXAM: RENAL / URINARY TRACT ULTRASOUND COMPLETE COMPARISON:  None. FINDINGS: Right Kidney: Renal measurements: 10.5 x 5.6 x 6.9 cm = volume: 210 mL. There is increased renal cortical echotexture with mild renal cortical thinning. No hydronephrosis or nephrolithiasis. Left Kidney: Renal measurements: 11.2 x 6.1 x 6.0 cm = volume: 212 mL. There is increased renal cortical echotexture and mild renal cortical thinning. No hydronephrosis or nephrolithiasis. Bladder: Appears normal for degree of bladder distention. Other: None. IMPRESSION:  1. Bilateral renal cortical thinning and increased cortical echotexture consistent with medical renal disease. Electronically Signed   By: Randa Ngo M.D.   On: 03/01/2020 21:20    Assessment & Plan:   Frank [name] was seen today for hypertension and diabetes.  Diagnoses and all orders for this visit:  Essential hypertension- His blood pressure is adequately well controlled.  Type 2 diabetes mellitus with peripheral neuropathy ([place])- His blood sugars are adequately well controlled.  I recommend that he keep a glucagon pen on hand in  the event of symptomatic hypoglycemia.  Will continue the combination of basal insulin and the sulfonylurea. -     Glucagon (GVOKE HYPOPEN 2-PACK) 1 MG/0.2ML SOAJ; Inject 1 Act into the skin daily as needed.   I am having [name] [name] "ken" start on Gvoke HypoPen 2-Pack. I am also having him maintain his Carbonyl Iron, fluticasone, triamcinolone cream, Vitamin D3, azelastine, levocetirizine, atorvastatin, doxazosin, Insulin Pen Needle, solifenacin, FLUoxetine, glipiZIDE, Toujeo Max SoloStar, and pantoprazole.  Meds ordered this encounter  Medications  . Glucagon (GVOKE HYPOPEN 2-PACK) 1 MG/0.2ML SOAJ    Sig: Inject 1 Act into the skin daily as needed.    Dispense:  2 mL    Refill:  5     Follow-up: Return in about 4 months (around 08/31/2020).  Scarlette Calico, MD

## 2020-05-02 ENCOUNTER — Encounter: Payer: Self-pay | Admitting: Internal Medicine

## 2020-05-02 ENCOUNTER — Other Ambulatory Visit: Payer: Self-pay | Admitting: Internal Medicine

## 2020-05-02 DIAGNOSIS — E1142 Type 2 diabetes mellitus with diabetic polyneuropathy: Secondary | ICD-10-CM

## 2020-05-02 DIAGNOSIS — I10 Essential (primary) hypertension: Secondary | ICD-10-CM

## 2020-05-02 MED ORDER — TELMISARTAN 20 MG PO TABS: 20.0000 mg | ORAL_TABLET | Freq: Every day | ORAL | 1 refills | Status: DC

## 2020-05-03 ENCOUNTER — Ambulatory Visit: Payer: Medicare Other

## 2020-05-09 ENCOUNTER — Other Ambulatory Visit: Payer: Self-pay

## 2020-05-09 ENCOUNTER — Encounter: Payer: Self-pay | Admitting: Internal Medicine

## 2020-05-09 ENCOUNTER — Ambulatory Visit: Payer: Medicare Other

## 2020-05-09 ENCOUNTER — Ambulatory Visit (INDEPENDENT_AMBULATORY_CARE_PROVIDER_SITE_OTHER): Payer: Medicare Other | Admitting: Internal Medicine

## 2020-05-09 VITALS — BP 140/80 | HR 62 | Temp 98.6°F | Wt 210.0 lb

## 2020-05-09 DIAGNOSIS — L03312 Cellulitis of back [any part except buttock]: Secondary | ICD-10-CM | POA: Diagnosis not present

## 2020-05-09 DIAGNOSIS — L089 Local infection of the skin and subcutaneous tissue, unspecified: Secondary | ICD-10-CM

## 2020-05-09 DIAGNOSIS — T148XXA Other injury of unspecified body region, initial encounter: Secondary | ICD-10-CM

## 2020-05-09 DIAGNOSIS — E538 Deficiency of other specified B group vitamins: Secondary | ICD-10-CM | POA: Diagnosis not present

## 2020-05-09 MED ORDER — CYANOCOBALAMIN 1000 MCG/ML IJ SOLN
1000.0000 ug | Freq: Once | INTRAMUSCULAR | Status: AC
  Administered 2020-05-09: 1000 ug via INTRAMUSCULAR

## 2020-05-09 MED ORDER — SIVEXTRO 200 MG PO TABS: 1.0000 | ORAL_TABLET | Freq: Every day | ORAL | 0 refills | Status: DC

## 2020-05-09 NOTE — Patient Instructions (Signed)
Wound Infection °A wound infection happens when tiny organisms (microorganisms) start to grow in a wound. A wound infection is most often caused by bacteria. Infection can cause the wound to break open or worsen. Wound infection needs treatment. If a wound infection is left untreated, complications can occur. Untreated wound infections may lead to an infection in the bloodstream (septicemia) or a bone infection (osteomyelitis). °What are the causes? °This condition is most often caused by bacteria growing in a wound. Other microorganisms, like yeast and fungi, can also cause wound infections. °What increases the risk? °The following factors may make you more likely to develop this condition: °· Having a weak body defense system (immune system). °· Having diabetes. °· Taking steroid medicines for a long time (chronic use). °· Smoking. °· Being an older person. °· Being overweight. °· Taking chemotherapy medicines. °What are the signs or symptoms? °Symptoms of this condition include: °· Having more redness, swelling, or pain at the wound site. °· Having more blood or fluid at the wound site. °· A bad smell coming from a wound or bandage (dressing). °· Having a fever. °· Feeling tired or fatigued. °· Having warmth at or around the wound. °· Having pus at the wound site. °How is this diagnosed? °This condition is diagnosed with a medical history and physical exam. You may also have a wound culture or blood tests or both. °How is this treated? °This condition is usually treated with an antibiotic medicine. °· The infection should improve 24-48 hours after you start antibiotics. °· After 24-48 hours, redness around the wound should stop spreading, and the wound should be less painful. °Follow these instructions at home: °Medicines °· Take or apply over-the-counter and prescription medicines only as told by your health care provider. °· If you were prescribed an antibiotic medicine, take or apply it as told by your health  care provider. Do not stop using the antibiotic even if you start to feel better. °Wound care ° °· Clean the wound each day, or as told by your health care provider. °? Wash the wound with mild soap and water. °? Rinse the wound with water to remove all soap. °? Pat the wound dry with a clean towel. Do not rub it. °· Follow instructions from your health care provider about how to take care of your wound. Make sure you: °? Wash your hands with soap and water before and after you change your dressing. If soap and water are not available, use hand sanitizer. °? Change your dressing as told by your health care provider. °? Leave stitches (sutures), skin glue, or adhesive strips in place if your wound has been closed. These skin closures may need to stay in place for 2 weeks or longer. If adhesive strip edges start to loosen and curl up, you may trim the loose edges. Do not remove adhesive strips completely unless your health care provider tells you to do that. Some wounds are left open to heal on their own. °· Check your wound every day for signs of infection. Watch for: °? More redness, swelling, or pain. °? More fluid or blood. °? Warmth. °? Pus or a bad smell. °General instructions °· Keep the dressing dry until your health care provider says it can be removed. °· Do not take baths, swim, or use a hot tub until your health care provider approves. Ask your health care provider if you may take showers. You may only be allowed to take sponge baths. °· Raise (elevate)   the injured area above the level of your heart while you are sitting or lying down. °· Do not scratch or pick at the wound. °· Keep all follow-up visits as told by your health care provider. This is important. °Contact a health care provider if: °· Your pain is not controlled with medicine. °· You have more redness, swelling, or pain around your wound. °· You have more fluid or blood coming from your wound. °· Your wound feels warm to the touch. °· You have  pus coming from your wound. °· You continue to notice a bad smell coming from your wound or your dressing. °· Your wound that was closed breaks open. °Get help right away if: °· You have a red streak going away from your wound. °· You have a fever. °Summary °· A wound infection happens when tiny organisms (microorganisms) start to grow in a wound. °· This condition is usually treated with an antibiotic medicine. °· Follow instructions from your health care provider about how to take care of your wound. °· Contact a health care provider if your wound infection does not begin to improve in 24-48 hours, or your symptoms worsen. °· Keep all follow-up visits as told by your health care provider. This is important. °This information is not intended to replace advice given to you by your health care provider. Make sure you discuss any questions you have with your health care provider. °Document Revised: 02/08/2018 Document Reviewed: 02/08/2018 °Elsevier Patient Education © 2020 [employer] ° °

## 2020-05-09 NOTE — Progress Notes (Signed)
Subjective:  Patient ID: Frank [name], male    DOB: 08-Mar-1941  Age: 79 y.o. MRN: [id number]  CC: Hypertension, Diabetes, and Rash  This visit occurred during the SARS-CoV-2 public health emergency.  Safety protocols were in place, including screening questions prior to the visit, additional usage of staff PPE, and extensive cleaning of exam room while observing appropriate contact time as indicated for disinfecting solutions.    HPI Frank [name] presents for concerns about an area over his left shoulder blade. It does not bother him. He denies injury/trauma. His wife pointed it out to him.  Outpatient Medications Prior to Visit  Medication Sig Dispense Refill  . atorvastatin (LIPITOR) 20 MG tablet TAKE ONE-HALF TABLET BY  MOUTH DAILY 45 tablet 1  . azelastine (ASTELIN) 0.1 % nasal spray Place 1 spray into both nostrils 2 (two) times daily. Use in each nostril as directed 90 mL 1  . Carbonyl Iron 25 MG TABS Take 25 mg by mouth daily.    . Cholecalciferol (VITAMIN D3) 50 MCG (2000 UT) TABS Take 1 tablet by mouth once daily 90 tablet 1  . doxazosin (CARDURA) 4 MG tablet TAKE ONE-HALF TABLET BY  MOUTH AT BEDTIME 45 tablet 1  . FLUoxetine (PROZAC) 20 MG capsule Take 1 capsule (20 mg total) by mouth daily. 90 capsule 1  . fluticasone (FLONASE) 50 MCG/ACT nasal spray Place 1 spray into both nostrils daily as needed for allergies or rhinitis.    [name] glipiZIDE (GLUCOTROL XL) 10 MG 24 hr tablet Take 1 tablet (10 mg total) by mouth daily. 90 tablet 1  . Glucagon (GVOKE HYPOPEN 2-PACK) 1 MG/0.2ML SOAJ Inject 1 Act into the skin daily as needed. 2 mL 5  . insulin glargine, 2 Unit Dial, (TOUJEO MAX SOLOSTAR) 300 UNIT/ML Solostar Pen Inject 10 Units into the skin daily. 6 mL 1  . Insulin Pen Needle 32G X 6 MM MISC 1 Act by Does not apply route daily. 100 each 1  . levocetirizine (XYZAL) 5 MG tablet Take 1 tablet (5 mg total) by mouth every evening. 90 tablet 1  . pantoprazole  (PROTONIX) 40 MG tablet TAKE 1 TABLET BY MOUTH  DAILY 90 tablet 1  . solifenacin (VESICARE) 10 MG tablet Take 1 tablet (10 mg total) by mouth daily. 90 tablet 1  . telmisartan (MICARDIS) 20 MG tablet Take 1 tablet (20 mg total) by mouth daily. 90 tablet 1  . triamcinolone cream (KENALOG) 0.1 %      No facility-administered medications prior to visit.    ROS Review of Systems  Constitutional: Negative for chills, fatigue and fever.  HENT: Negative.   Eyes: Negative.   Respiratory: Negative for cough, shortness of breath and wheezing.   Cardiovascular: Negative for chest pain and leg swelling.  Gastrointestinal: Negative for abdominal pain.  Endocrine: Negative.   Genitourinary: Negative.  Negative for difficulty urinating.  Musculoskeletal: Positive for arthralgias.  Skin: Positive for wound.  Neurological: Negative.  Negative for dizziness, weakness and light-headedness.  Hematological: Negative for adenopathy. Does not bruise/bleed easily.  Psychiatric/Behavioral: Negative.     Objective:  BP 140/80 (BP Location: Left Arm, Patient Position: Sitting, Cuff Size: Normal)   Pulse 62   Temp 98.6 F (37 C) (Oral)   Wt 210 lb (95.3 kg)   SpO2 95%   BMI 31.01 kg/m   BP Readings from Last 3 Encounters:  05/13/20 (!) 119/55  05/09/20 140/80  04/30/20 136/70    Wt Readings from  Last 3 Encounters:  05/13/20 213 lb (96.6 kg)  05/09/20 210 lb (95.3 kg)  04/30/20 213 lb (96.6 kg)    Physical Exam Vitals reviewed.  Constitutional:      Appearance: Normal appearance.  HENT:     Nose: Nose normal.     Mouth/Throat:     Mouth: Mucous membranes are moist.  Eyes:     General: No scleral icterus.    Conjunctiva/sclera: Conjunctivae normal.  Cardiovascular:     Rate and Rhythm: Normal rate and regular rhythm.  Pulmonary:     Breath sounds: No stridor. No wheezing, rhonchi or rales.  Abdominal:     General: Abdomen is flat.  Musculoskeletal:       Arms:     Cervical back:  Neck supple.  Lymphadenopathy:     Cervical: No cervical adenopathy.  Neurological:     Mental Status: He is alert.     Lab Results  Component Value Date   WBC 7.0 05/13/2020   HGB 11.6 (L) 05/13/2020   HCT 37.6 (L) 05/13/2020   PLT 153 05/13/2020   GLUCOSE 203 (H) 05/13/2020   CHOL 130 04/20/2019   TRIG 89.0 04/20/2019   HDL 46.10 04/20/2019   LDLCALC 66 04/20/2019   ALT 19 08/04/2017   AST 20 08/04/2017   NA 139 05/13/2020   K 4.9 05/13/2020   CL 100 05/13/2020   CREATININE 1.78 (H) 05/13/2020   BUN 42 (H) 05/13/2020   CO2 26 05/13/2020   TSH 2.02 05/25/2018   PSA 1.08 07/10/2015   INR 0.98 08/04/2017   HGBA1C 7.5 (A) 03/27/2020   MICROALBUR 67.2 04/23/2020    US RENAL  Result Date: 03/01/2020 CLINICAL DATA:  Stage III renal insufficiency, proteinuria EXAM: RENAL / URINARY TRACT ULTRASOUND COMPLETE COMPARISON:  None. FINDINGS: Right Kidney: Renal measurements: 10.5 x 5.6 x 6.9 cm = volume: 210 mL. There is increased renal cortical echotexture with mild renal cortical thinning. No hydronephrosis or nephrolithiasis. Left Kidney: Renal measurements: 11.2 x 6.1 x 6.0 cm = volume: 212 mL. There is increased renal cortical echotexture and mild renal cortical thinning. No hydronephrosis or nephrolithiasis. Bladder: Appears normal for degree of bladder distention. Other: None. IMPRESSION: 1. Bilateral renal cortical thinning and increased cortical echotexture consistent with medical renal disease. Electronically Signed   By: Randa Ngo M.D.   On: 03/01/2020 21:20    Assessment & Plan:   Brysen was seen today for hypertension, diabetes and rash.  Diagnoses and all orders for this visit:  Cellulitis of back except buttock- See below. -     Discontinue: Tedizolid Phosphate (SIVEXTRO) 200 MG TABS; Take 1 tablet by mouth daily for 2 days. -     Discontinue: Tedizolid Phosphate (SIVEXTRO) 200 MG TABS; Take 1 tablet by mouth daily for 3 days. -     cefdinir (OMNICEF) 250  MG/5ML suspension; Take 5 mLs (250 mg total) by mouth 2 (two) times daily for 6 days.  Wound infection, posttraumatic- Will treat for cellulitis. I had prescribed tedizolid to treat for strep and staph but he tells me that this is too expansive. Will treat with renally-dosed cefdinir.  -     Discontinue: Tedizolid Phosphate (SIVEXTRO) 200 MG TABS; Take 1 tablet by mouth daily for 2 days. -     Discontinue: Tedizolid Phosphate (SIVEXTRO) 200 MG TABS; Take 1 tablet by mouth daily for 3 days. -     cefdinir (OMNICEF) 250 MG/5ML suspension; Take 5 mLs (250 mg total) by mouth  2 (two) times daily for 6 days.  B12 deficiency -     cyanocobalamin ((VITAMIN B-12)) injection 1,000 mcg   I have discontinued [name] Ambers "ken"'s Sivextro and Sivextro. I am also having him start on cefdinir. Additionally, I am having him maintain his Carbonyl Iron, fluticasone, triamcinolone cream, Vitamin D3, azelastine, levocetirizine, atorvastatin, doxazosin, Insulin Pen Needle, solifenacin, FLUoxetine, glipiZIDE, Toujeo Max SoloStar, pantoprazole, Gvoke HypoPen 2-Pack, and telmisartan. We administered cyanocobalamin.  Meds ordered this encounter  Medications  . DISCONTD: Tedizolid Phosphate (SIVEXTRO) 200 MG TABS    Sig: Take 1 tablet by mouth daily for 2 days.    Dispense:  2 tablet    Refill:  0  . DISCONTD: Tedizolid Phosphate (SIVEXTRO) 200 MG TABS    Sig: Take 1 tablet by mouth daily for 3 days.    Dispense:  3 tablet    Refill:  0  . cyanocobalamin ((VITAMIN B-12)) injection 1,000 mcg  . cefdinir (OMNICEF) 250 MG/5ML suspension    Sig: Take 5 mLs (250 mg total) by mouth 2 (two) times daily for 6 days.    Dispense:  60 mL    Refill:  0     Follow-up: Return in about 1 week (around 05/16/2020).  Scarlette Calico, MD

## 2020-05-10 ENCOUNTER — Telehealth: Payer: Self-pay | Admitting: Internal Medicine

## 2020-05-10 MED ORDER — CEFDINIR 250 MG/5ML PO SUSR: 250.0000 mg | Freq: Two times a day (BID) | ORAL | 0 refills | Status: AC

## 2020-05-10 NOTE — Telephone Encounter (Signed)
    Patient calling to report Tedizolid Phosphate (SIVEXTRO) 200 MG TABS not covered by insurance, cost $1300

## 2020-05-13 ENCOUNTER — Other Ambulatory Visit: Payer: Self-pay

## 2020-05-13 ENCOUNTER — Emergency Department (HOSPITAL_COMMUNITY)
Admission: EM | Admit: 2020-05-13 | Discharge: 2020-05-13 | Disposition: A | Discharge status: Home / Self Care | Payer: Medicare Other | Attending: Emergency Medicine | Admitting: Emergency Medicine

## 2020-05-13 ENCOUNTER — Encounter (HOSPITAL_COMMUNITY): Payer: Self-pay

## 2020-05-13 DIAGNOSIS — Z79899 Other long term (current) drug therapy: Secondary | ICD-10-CM | POA: Insufficient documentation

## 2020-05-13 DIAGNOSIS — E119 Type 2 diabetes mellitus without complications: Secondary | ICD-10-CM | POA: Diagnosis not present

## 2020-05-13 DIAGNOSIS — Z743 Need for continuous supervision: Secondary | ICD-10-CM | POA: Diagnosis not present

## 2020-05-13 DIAGNOSIS — Z87891 Personal history of nicotine dependence: Secondary | ICD-10-CM | POA: Insufficient documentation

## 2020-05-13 DIAGNOSIS — W19XXXA Unspecified fall, initial encounter: Secondary | ICD-10-CM

## 2020-05-13 DIAGNOSIS — S51012A Laceration without foreign body of left elbow, initial encounter: Secondary | ICD-10-CM | POA: Diagnosis not present

## 2020-05-13 DIAGNOSIS — R6889 Other general symptoms and signs: Secondary | ICD-10-CM | POA: Diagnosis not present

## 2020-05-13 DIAGNOSIS — W010XXA Fall on same level from slipping, tripping and stumbling without subsequent striking against object, initial encounter: Secondary | ICD-10-CM | POA: Insufficient documentation

## 2020-05-13 DIAGNOSIS — I10 Essential (primary) hypertension: Secondary | ICD-10-CM | POA: Insufficient documentation

## 2020-05-13 DIAGNOSIS — S59902A Unspecified injury of left elbow, initial encounter: Secondary | ICD-10-CM | POA: Diagnosis present

## 2020-05-13 DIAGNOSIS — I499 Cardiac arrhythmia, unspecified: Secondary | ICD-10-CM | POA: Diagnosis not present

## 2020-05-13 DIAGNOSIS — R0902 Hypoxemia: Secondary | ICD-10-CM | POA: Diagnosis not present

## 2020-05-13 LAB — CBC WITH DIFFERENTIAL/PLATELET
Abs Immature Granulocytes: 0.02 10*3/uL (ref 0.00–0.07)
Basophils Absolute: 0 10*3/uL (ref 0.0–0.1)
Basophils Relative: 1 %
Eosinophils Absolute: 0.3 10*3/uL (ref 0.0–0.5)
Eosinophils Relative: 4 %
HCT: 37.6 % — ABNORMAL LOW (ref 39.0–52.0)
Hemoglobin: 11.6 g/dL — ABNORMAL LOW (ref 13.0–17.0)
Immature Granulocytes: 0 %
Lymphocytes Relative: 13 %
Lymphs Abs: 0.9 10*3/uL (ref 0.7–4.0)
MCH: 26.6 pg (ref 26.0–34.0)
MCHC: 30.9 g/dL (ref 30.0–36.0)
MCV: 86.2 fL (ref 80.0–100.0)
Monocytes Absolute: 0.7 10*3/uL (ref 0.1–1.0)
Monocytes Relative: 10 %
Neutro Abs: 5.1 10*3/uL (ref 1.7–7.7)
Neutrophils Relative %: 72 %
Platelets: 153 10*3/uL (ref 150–400)
RBC: 4.36 MIL/uL (ref 4.22–5.81)
RDW: 13.2 % (ref 11.5–15.5)
WBC: 7 10*3/uL (ref 4.0–10.5)
nRBC: 0 % (ref 0.0–0.2)

## 2020-05-13 LAB — BASIC METABOLIC PANEL
Anion gap: 13 (ref 5–15)
BUN: 42 mg/dL — ABNORMAL HIGH (ref 8–23)
CO2: 26 mmol/L (ref 22–32)
Calcium: 9 mg/dL (ref 8.9–10.3)
Chloride: 100 mmol/L (ref 98–111)
Creatinine, Ser: 1.78 mg/dL — ABNORMAL HIGH (ref 0.61–1.24)
GFR, Estimated: 38 mL/min — ABNORMAL LOW (ref >60)
Glucose, Bld: 203 mg/dL — ABNORMAL HIGH (ref 70–99)
Potassium: 4.9 mmol/L (ref 3.5–5.1)
Sodium: 139 mmol/L (ref 135–145)

## 2020-05-13 MED ORDER — SODIUM CHLORIDE 0.9 % IV BOLUS
1000.0000 mL | Freq: Once | INTRAVENOUS | Status: AC
  Administered 2020-05-13: 1000 mL via INTRAVENOUS

## 2020-05-13 NOTE — ED Notes (Signed)
Wounds bandaged on left hand knee and elbow

## 2020-05-13 NOTE — Discharge Instructions (Addendum)
Please drink more water at home.  It is possible that you got lightheaded because you were slightly dehydrated today.  Take your time standing up in the future.  Please follow-up with your doctor tomorrow.

## 2020-05-13 NOTE — ED Provider Notes (Signed)
[place] DEPT Provider Note   CSN: [id number] Arrival date & time: 05/13/20  1958     History Chief Complaint  Patient presents with  . Fall    pt states his legs became weak and he fell. pt. has skin tear to left hand and left elbow. pt deies loc or pain     Frank [name] is a 79 y.o. male history of traumatic brain injury, diabetes, presenting to emergency department with a fall at home.  The patient reports he been sitting in his office chair for long time today.  He stood up and became lightheaded.  He fell onto the carpet.  He denies any head injury or loss of consciousness.  He did call EMS to come pick him up.  He has an abrasion on his left elbow from rubbing against the carpet.  He reports he has felt generally weak for some time.  He says he does not think he drinks enough water.  He said he has had a few falls over the past few months.  He normally walks without a walker or assistance.  He is not on any blood thinners.  He denies any headache, neck pain, back pain.  He denies any pain anywhere.  HPI     Past Medical History:  Diagnosis Date  . Allergy    Rhinitis  . Anemia    NOS iron deficient and B12 deficient  . Arthritis   . Diabetes mellitus    Type 2  . GERD (gastroesophageal reflux disease)   . Hyperlipidemia   . Hypertension   . 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 OSA (obstructive sleep apnea)    cpap  . TBI (traumatic brain injury) [facility])     Patient Active Problem List   Diagnosis Date Noted  . Cellulitis of back except buttock 05/09/2020  . Wound infection, posttraumatic 05/09/2020  . Contusion of scalp 02/01/2020  . 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 Chronic renal disease, stage 3, moderately decreased glomerular filtration rate (GFR) between 30-59 mL/min/1.73 square meter ([place]) 11/21/2018  . GERD without esophagitis 07/22/2017  .  Depression   . Type 2 diabetes mellitus with peripheral neuropathy ([name])   . OAB (overactive bladder) 11/26/2014  . Routine general medical examination at a health care facility 04/06/2014  . Vitamin D deficiency 07/23/2010  . Obstructive sleep apnea 09/13/2008  . Hyperlipidemia with target LDL less than 100 07/27/2007  . BPH (benign prostatic hyperplasia) 06/21/2007  . 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 B12 deficiency anemia 04/15/2006  . Essential hypertension 04/15/2006  . Allergic rhinitis 04/15/2006    Past Surgical History:  Procedure Laterality Date  . APPENDECTOMY    . arm fracture Left 2011   with hardware  . BURR HOLE Bilateral 08/04/2017   Procedure: Haskell Flirt;  Surgeon: [name], MD;  Location: [place];  Service: Neurosurgery;  Laterality: Bilateral;  . COLONOSCOPY    . CRANIOTOMY Left 12/01/2017   Procedure: Trudee Kuster Holes CRANIOTOMY HEMATOMA EVACUATION SUBDURAL;  Surgeon: [name], MD;  Location: [place];  Service: Neurosurgery;  Laterality: Left;  . ESOPHAGOGASTRODUODENOSCOPY  2006   gastritis  . ROTATOR CUFF REPAIR    . SEPTOPLASTY  1959   Deviated Septum  . 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 Heart disease Mother   . Breast cancer Mother   .  Stroke Father   . Allergies Father        Father and children  . Coronary artery disease Brother   . Diabetes Neg Hx   . Colon cancer Neg Hx     Social History   Tobacco Use  . Smoking status: Former Smoker    Packs/day: 1.00    Quit date: 07/14/1983    Years since quitting: 36.8  . Smokeless tobacco: Never Used  Vaping Use  . Vaping Use: Never used  Substance Use Topics  . Alcohol use: Yes    Alcohol/week: 2.0 standard drinks    Types: 2 Standard drinks or equivalent per week    Comment: occassionally  . Drug use: No    Home Medications Prior to Admission medications   Medication Sig Start Date End Date Taking? Authorizing Provider    atorvastatin (LIPITOR) 20 MG tablet TAKE ONE-HALF TABLET BY  MOUTH DAILY 12/24/19   Janith Lima, MD  azelastine (ASTELIN) 0.1 % nasal spray Place 1 spray into both nostrils 2 (two) times daily. Use in each nostril as directed 11/27/19   Janith Lima, MD  Carbonyl Iron 25 MG TABS Take 25 mg by mouth daily.    [provider]  cefdinir (OMNICEF) 250 MG/5ML suspension Take 5 mLs (250 mg total) by mouth 2 (two) times daily for 6 days. 05/10/20 05/16/20  Janith Lima, MD  Cholecalciferol (VITAMIN D3) 50 MCG (2000 UT) TABS Take 1 tablet by mouth once daily 09/05/19   Janith Lima, MD  doxazosin (CARDURA) 4 MG tablet TAKE ONE-HALF TABLET BY  MOUTH AT BEDTIME 12/24/19   Janith Lima, MD  FLUoxetine (PROZAC) 20 MG capsule Take 1 capsule (20 mg total) by mouth daily. 04/10/20   Janith Lima, MD  fluticasone (FLONASE) 50 MCG/ACT nasal spray Place 1 spray into both nostrils daily as needed for allergies or rhinitis.    [provider]  glipiZIDE (GLUCOTROL XL) 10 MG 24 hr tablet Take 1 tablet (10 mg total) by mouth daily. 04/10/20   Janith Lima, MD  Glucagon (GVOKE HYPOPEN 2-PACK) 1 MG/0.2ML SOAJ Inject 1 Act into the skin daily as needed. 04/30/20   Janith Lima, MD  insulin glargine, 2 Unit Dial, (TOUJEO MAX SOLOSTAR) 300 UNIT/ML Solostar Pen Inject 10 Units into the skin daily. 04/16/20   Janith Lima, MD  Insulin Pen Needle 32G X 6 MM MISC 1 Act by Does not apply route daily. 04/08/20   Janith Lima, MD  levocetirizine (XYZAL) 5 MG tablet Take 1 tablet (5 mg total) by mouth every evening. 11/27/19   Janith Lima, MD  pantoprazole (PROTONIX) 40 MG tablet TAKE 1 TABLET BY MOUTH  DAILY 04/30/20   Janith Lima, MD  solifenacin (VESICARE) 10 MG tablet Take 1 tablet (10 mg total) by mouth daily. 04/10/20   Janith Lima, MD  telmisartan (MICARDIS) 20 MG tablet Take 1 tablet (20 mg total) by mouth daily. 05/02/20   Janith Lima, MD  triamcinolone cream (KENALOG)  0.1 %  07/26/19   [provider]  amLODipine (NORVASC) 10 MG tablet TAKE 1 TABLET BY MOUTH  DAILY 07/20/19   Janith Lima, MD  atorvastatin (LIPITOR) 20 MG tablet TAKE ONE-HALF TABLET BY  MOUTH DAILY 07/20/19   Janith Lima, MD  Cholecalciferol (VITAMIN D3) 50 MCG (2000 UT) TABS Take 1 tablet by mouth once daily 06/05/19   Janith Lima, MD  doxazosin (CARDURA) 4  MG tablet TAKE ONE-HALF TABLET BY  MOUTH AT BEDTIME 07/20/19   Janith Lima, MD  telmisartan (MICARDIS) 80 MG tablet TAKE 1 TABLET BY MOUTH  DAILY 07/20/19   Janith Lima, MD    Allergies    Lisinopril, Penicillins, and Sulfamethoxazole  Review of Systems   Review of Systems  Constitutional: Positive for fatigue. Negative for chills and fever.  HENT: Negative for ear pain and sore throat.   Eyes: Negative for pain and visual disturbance.  Respiratory: Negative for cough and shortness of breath.   Cardiovascular: Negative for chest pain and palpitations.  Gastrointestinal: Negative for abdominal pain and vomiting.  Genitourinary: Negative for dysuria and hematuria.  Musculoskeletal: Negative for arthralgias and back pain.  Skin: Positive for rash and wound.  Neurological: Positive for light-headedness. Negative for syncope and headaches.  Psychiatric/Behavioral: Negative for agitation.  All other systems reviewed and are negative.   Physical Exam Updated Vital Signs BP (!) 119/55   Pulse 90   Temp 98 F (36.7 C)   Resp 18   Ht 5\' 9"  (1.753 m)   Wt 96.6 kg   SpO2 99%   BMI 31.45 kg/m   Physical Exam Vitals and nursing note reviewed.  Constitutional:      Appearance: He is well-developed.  HENT:     Head: Normocephalic and atraumatic.  Eyes:     Conjunctiva/sclera: Conjunctivae normal.  Cardiovascular:     Rate and Rhythm: Normal rate and regular rhythm.     Pulses: Normal pulses.  Pulmonary:     Effort: Pulmonary effort is normal. No respiratory distress.     Breath sounds: Normal breath  sounds.  Abdominal:     Palpations: Abdomen is soft.     Tenderness: There is no abdominal tenderness.  Musculoskeletal:        General: No swelling, tenderness, deformity or signs of injury.     Cervical back: Neck supple.     Comments: Full ROM without pain or tenderness of all extremities and at the hip No pelvic pain or instability No spinal midline ttp  Skin:    General: Skin is warm and dry.     Comments: Abrasion to left elbow, superficial Small abrasions to bilateral wrists  Neurological:     General: No focal deficit present.     Mental Status: He is alert and oriented to person, place, and time.     Sensory: No sensory deficit.     Motor: No weakness.  Psychiatric:        Mood and Affect: Mood normal.        Behavior: Behavior normal.     ED Results / Procedures / Treatments   Labs (all labs ordered are listed, but only abnormal results are displayed) Labs Reviewed  BASIC METABOLIC PANEL - Abnormal; Notable for the following components:      Result Value   Glucose, Bld 203 (*)    BUN 42 (*)    Creatinine, Ser 1.78 (*)    GFR, Estimated 38 (*)    All other components within normal limits  CBC WITH DIFFERENTIAL/PLATELET - Abnormal; Notable for the following components:   Hemoglobin 11.6 (*)    HCT 37.6 (*)    All other components within normal limits    EKG EKG Interpretation  Date/Time:  Monday May 13 2020 22:24:13 EDT Ventricular Rate:  70 PR Interval:  236 QRS Duration: 84 QT Interval:  416 QTC Calculation: 449 R Axis:   44 Text  Interpretation: Sinus rhythm with 1st degree A-V block with Premature atrial complexes Anterior infarct , age undetermined Abnormal ECG Baseline artifract in V3-V4, otherwise no signifciant change from prior ECG Jan 2019 Confirmed by Octaviano Glow [phone]) on 05/14/2020 11:04:23 AM   Radiology No results found.  Procedures Procedures (including critical care time)  Medications Ordered in ED Medications  sodium  chloride 0.9 % bolus 1,000 mL (0 mLs Intravenous Stopped 05/13/20 2318)    ED Course  I have reviewed the triage vital signs and the nursing notes.  Pertinent labs & imaging results that were available during my care of the patient were reviewed by me and considered in my medical decision making (see chart for details).  79 yo male presenting to the ED with a fall in the setting of near syncope.  DDx includes orthostatic hypotension vs hypoglycemia vs other  This was most likely orthostasis given that he became lightheaded upon standing from his desk.  The patient tells me he doesn't drink enough water at home.  We will check ortho vitals here, place IV and give a small fluid bolus, and check BMP and CBC to look for signs of significant dehydration, electrolyte abnormalities, or anemia.  We can also check an ECG for sign of arrhythmia.  I doubt ACS at this time with no active symptoms.  *  Labs reviewed - hgb 11.6, WBC 7.0, BMP with BUN 42, Cr 1.78.  Very mild increase in BUN and Cr, suggestive of prerenal etiology.   ECG per my interpretation shows a normal sinus rhythm with some baseline artifact in V3-V4, but no significant changes from his prior tracings.    Clinical Course as of May 14 1104  Mon May 13, 2020  2246 Ortho vitals with 20 mmhg drop after lying to standing, possibly cause of fall   [MT]  2304 He continues feeling well - wife now present at bedside.  Will d/c with PCP follow up tomorrow.  Advised drinking more water at home, suspect orthostasis as cause of fall today   [MT]    Clinical Course User Index [MT] Lua Feng, Carola Rhine, MD    Final Clinical Impression(s) / ED Diagnoses Final diagnoses:  Fall, initial encounter  Laceration of left elbow, initial encounter    Rx / DC Orders ED Discharge Orders    None       Wyvonnia Dusky, MD 05/14/20 1105

## 2020-05-13 NOTE — ED Notes (Signed)
Pt states his legs became weak and her fell. Pt has skin tears to left hand and left elbow

## 2020-05-14 ENCOUNTER — Encounter (HOSPITAL_COMMUNITY): Payer: Self-pay | Admitting: Internal Medicine

## 2020-05-14 ENCOUNTER — Ambulatory Visit (HOSPITAL_COMMUNITY)
Admission: EM | Disposition: A | Discharge status: Home / Self Care | Payer: Self-pay | Source: Home / Self Care | Attending: Emergency Medicine

## 2020-05-14 ENCOUNTER — Ambulatory Visit (HOSPITAL_COMMUNITY)
Admission: EM | Admit: 2020-05-14 | Discharge: 2020-05-15 | Disposition: A | Discharge status: Home / Self Care | Payer: Medicare Other | Attending: Internal Medicine | Admitting: Internal Medicine

## 2020-05-14 DIAGNOSIS — R001 Bradycardia, unspecified: Secondary | ICD-10-CM

## 2020-05-14 DIAGNOSIS — Z880 Allergy status to penicillin: Secondary | ICD-10-CM | POA: Insufficient documentation

## 2020-05-14 DIAGNOSIS — I499 Cardiac arrhythmia, unspecified: Secondary | ICD-10-CM | POA: Diagnosis not present

## 2020-05-14 DIAGNOSIS — Z8249 Family history of ischemic heart disease and other diseases of the circulatory system: Secondary | ICD-10-CM | POA: Insufficient documentation

## 2020-05-14 DIAGNOSIS — I441 Atrioventricular block, second degree: Secondary | ICD-10-CM | POA: Insufficient documentation

## 2020-05-14 DIAGNOSIS — Z20822 Contact with and (suspected) exposure to covid-19: Secondary | ICD-10-CM | POA: Insufficient documentation

## 2020-05-14 DIAGNOSIS — Z882 Allergy status to sulfonamides status: Secondary | ICD-10-CM | POA: Insufficient documentation

## 2020-05-14 DIAGNOSIS — Z950 Presence of cardiac pacemaker: Secondary | ICD-10-CM

## 2020-05-14 DIAGNOSIS — Z888 Allergy status to other drugs, medicaments and biological substances status: Secondary | ICD-10-CM | POA: Insufficient documentation

## 2020-05-14 DIAGNOSIS — Z743 Need for continuous supervision: Secondary | ICD-10-CM | POA: Diagnosis not present

## 2020-05-14 DIAGNOSIS — Z87891 Personal history of nicotine dependence: Secondary | ICD-10-CM | POA: Diagnosis not present

## 2020-05-14 DIAGNOSIS — R6889 Other general symptoms and signs: Secondary | ICD-10-CM | POA: Diagnosis not present

## 2020-05-14 DIAGNOSIS — I4430 Unspecified atrioventricular block: Secondary | ICD-10-CM | POA: Diagnosis not present

## 2020-05-14 DIAGNOSIS — I440 Atrioventricular block, first degree: Secondary | ICD-10-CM | POA: Diagnosis not present

## 2020-05-14 HISTORY — DX: Atrioventricular block, second degree: I44.1

## 2020-05-14 HISTORY — PX: PACEMAKER IMPLANT: EP1218

## 2020-05-14 LAB — CBC WITH DIFFERENTIAL/PLATELET
Abs Immature Granulocytes: 0.02 10*3/uL (ref 0.00–0.07)
Basophils Absolute: 0 10*3/uL (ref 0.0–0.1)
Basophils Relative: 1 %
Eosinophils Absolute: 0.3 10*3/uL (ref 0.0–0.5)
Eosinophils Relative: 4 %
HCT: 34 % — ABNORMAL LOW (ref 39.0–52.0)
Hemoglobin: 10.4 g/dL — ABNORMAL LOW (ref 13.0–17.0)
Immature Granulocytes: 0 %
Lymphocytes Relative: 11 %
Lymphs Abs: 0.8 10*3/uL (ref 0.7–4.0)
MCH: 26.7 pg (ref 26.0–34.0)
MCHC: 30.6 g/dL (ref 30.0–36.0)
MCV: 87.2 fL (ref 80.0–100.0)
Monocytes Absolute: 0.7 10*3/uL (ref 0.1–1.0)
Monocytes Relative: 10 %
Neutro Abs: 5.4 10*3/uL (ref 1.7–7.7)
Neutrophils Relative %: 74 %
Platelets: 125 10*3/uL — ABNORMAL LOW (ref 150–400)
RBC: 3.9 MIL/uL — ABNORMAL LOW (ref 4.22–5.81)
RDW: 13.2 % (ref 11.5–15.5)
WBC: 7.3 10*3/uL (ref 4.0–10.5)
nRBC: 0 % (ref 0.0–0.2)

## 2020-05-14 LAB — TSH: TSH: 1.479 u[IU]/mL (ref 0.350–4.500)

## 2020-05-14 LAB — BASIC METABOLIC PANEL
Anion gap: 7 (ref 5–15)
BUN: 36 mg/dL — ABNORMAL HIGH (ref 8–23)
CO2: 24 mmol/L (ref 22–32)
Calcium: 7.8 mg/dL — ABNORMAL LOW (ref 8.9–10.3)
Chloride: 106 mmol/L (ref 98–111)
Creatinine, Ser: 1.62 mg/dL — ABNORMAL HIGH (ref 0.61–1.24)
GFR, Estimated: 42 mL/min — ABNORMAL LOW (ref >60)
Glucose, Bld: 166 mg/dL — ABNORMAL HIGH (ref 70–99)
Potassium: 4 mmol/L (ref 3.5–5.1)
Sodium: 137 mmol/L (ref 135–145)

## 2020-05-14 LAB — RESPIRATORY PANEL BY RT PCR (FLU A&B, COVID)
Influenza A by PCR: NEGATIVE
Influenza B by PCR: NEGATIVE
SARS Coronavirus 2 by RT PCR: NEGATIVE

## 2020-05-14 LAB — SURGICAL PCR SCREEN
MRSA, PCR: NEGATIVE
Staphylococcus aureus: NEGATIVE

## 2020-05-14 LAB — BRAIN NATRIURETIC PEPTIDE: B Natriuretic Peptide: 393.4 pg/mL — ABNORMAL HIGH (ref 0.0–100.0)

## 2020-05-14 LAB — MAGNESIUM: Magnesium: 1.7 mg/dL (ref 1.7–2.4)

## 2020-05-14 SURGERY — PACEMAKER IMPLANT

## 2020-05-14 MED ORDER — LIDOCAINE HCL 1 % IJ SOLN
INTRAMUSCULAR | Status: AC
  Filled 2020-05-14: qty 20

## 2020-05-14 MED ORDER — ATORVASTATIN CALCIUM 10 MG PO TABS
10.0000 mg | ORAL_TABLET | Freq: Every day | ORAL | Status: DC
  Administered 2020-05-15: 10 mg via ORAL
  Filled 2020-05-14: qty 1

## 2020-05-14 MED ORDER — INSULIN PEN NEEDLE 32G X 6 MM MISC: 1.0000 | Freq: Every day | Status: DC

## 2020-05-14 MED ORDER — VITAMIN D 25 MCG (1000 UNIT) PO TABS
2000.0000 [IU] | ORAL_TABLET | Freq: Every day | ORAL | Status: DC
  Administered 2020-05-15: 2000 [IU] via ORAL
  Filled 2020-05-14 (×2): qty 2

## 2020-05-14 MED ORDER — INSULIN GLARGINE 100 UNIT/ML ~~LOC~~ SOLN
10.0000 [IU] | Freq: Every day | SUBCUTANEOUS | Status: DC
  Administered 2020-05-15: 10 [IU] via SUBCUTANEOUS
  Filled 2020-05-14: qty 0.1

## 2020-05-14 MED ORDER — SODIUM CHLORIDE 0.9 % IV SOLN
INTRAVENOUS | Status: AC
  Filled 2020-05-14: qty 2

## 2020-05-14 MED ORDER — ONDANSETRON HCL 4 MG/2ML IJ SOLN: 4.0000 mg | Freq: Four times a day (QID) | INTRAMUSCULAR | Status: DC | PRN

## 2020-05-14 MED ORDER — VANCOMYCIN HCL IN DEXTROSE 1-5 GM/200ML-% IV SOLN
1000.0000 mg | Freq: Two times a day (BID) | INTRAVENOUS | Status: AC
  Administered 2020-05-15: 1000 mg via INTRAVENOUS
  Filled 2020-05-14: qty 200

## 2020-05-14 MED ORDER — FENTANYL CITRATE (PF) 100 MCG/2ML IJ SOLN
INTRAMUSCULAR | Status: AC
  Filled 2020-05-14: qty 2

## 2020-05-14 MED ORDER — SODIUM CHLORIDE 0.9 % IV SOLN: INTRAVENOUS | Status: DC

## 2020-05-14 MED ORDER — ACETAMINOPHEN 325 MG PO TABS: 325.0000 mg | ORAL_TABLET | ORAL | Status: DC | PRN

## 2020-05-14 MED ORDER — CEFDINIR 250 MG/5ML PO SUSR
250.0000 mg | Freq: Two times a day (BID) | ORAL | Status: DC
  Administered 2020-05-14 – 2020-05-15 (×2): 250 mg via ORAL
  Filled 2020-05-14 (×2): qty 5

## 2020-05-14 MED ORDER — SODIUM CHLORIDE 0.9 % IV SOLN
80.0000 mg | INTRAVENOUS | Status: AC
  Administered 2020-05-14: 80 mg

## 2020-05-14 MED ORDER — VANCOMYCIN HCL IN DEXTROSE 1-5 GM/200ML-% IV SOLN
INTRAVENOUS | Status: AC
  Filled 2020-05-14: qty 200

## 2020-05-14 MED ORDER — INSULIN GLARGINE (2 UNIT DIAL) 300 UNIT/ML ~~LOC~~ SOPN: 10.0000 [IU] | PEN_INJECTOR | Freq: Every day | SUBCUTANEOUS | Status: DC

## 2020-05-14 MED ORDER — FENTANYL CITRATE (PF) 100 MCG/2ML IJ SOLN
INTRAMUSCULAR | Status: DC | PRN
Start: 2020-05-14 — End: 2020-05-14
  Administered 2020-05-14 (×2): 12.5 ug via INTRAVENOUS

## 2020-05-14 MED ORDER — IRBESARTAN 75 MG PO TABS
75.0000 mg | ORAL_TABLET | Freq: Every day | ORAL | Status: DC
  Administered 2020-05-15: 75 mg via ORAL
  Filled 2020-05-14 (×2): qty 1

## 2020-05-14 MED ORDER — HEPARIN (PORCINE) IN NACL 1000-0.9 UT/500ML-% IV SOLN
INTRAVENOUS | Status: AC
  Filled 2020-05-14: qty 500

## 2020-05-14 MED ORDER — MIDAZOLAM HCL 5 MG/5ML IJ SOLN
INTRAMUSCULAR | Status: AC
  Filled 2020-05-14: qty 5

## 2020-05-14 MED ORDER — LIDOCAINE HCL (PF) 1 % IJ SOLN
INTRAMUSCULAR | Status: DC | PRN
  Administered 2020-05-14: 20 mL
  Administered 2020-05-14: 60 mL

## 2020-05-14 MED ORDER — FLUTICASONE PROPIONATE 50 MCG/ACT NA SUSP
1.0000 | Freq: Every day | NASAL | Status: DC | PRN
  Filled 2020-05-14: qty 16

## 2020-05-14 MED ORDER — AZELASTINE HCL 0.1 % NA SOLN
1.0000 | Freq: Two times a day (BID) | NASAL | Status: DC
  Administered 2020-05-14 – 2020-05-15 (×2): 1 via NASAL
  Filled 2020-05-14: qty 30

## 2020-05-14 MED ORDER — CARBONYL IRON 25 MG PO TABS: 25.0000 mg | ORAL_TABLET | Freq: Every day | ORAL | Status: DC

## 2020-05-14 MED ORDER — PANTOPRAZOLE SODIUM 40 MG PO TBEC
40.0000 mg | DELAYED_RELEASE_TABLET | Freq: Every day | ORAL | Status: DC
  Administered 2020-05-15: 40 mg via ORAL
  Filled 2020-05-14: qty 1

## 2020-05-14 MED ORDER — DOXAZOSIN MESYLATE 2 MG PO TABS
2.0000 mg | ORAL_TABLET | Freq: Every day | ORAL | Status: DC
  Administered 2020-05-14: 2 mg via ORAL
  Filled 2020-05-14: qty 1

## 2020-05-14 MED ORDER — LEVOCETIRIZINE DIHYDROCHLORIDE 5 MG PO TABS: 5.0000 mg | ORAL_TABLET | Freq: Every evening | ORAL | Status: DC

## 2020-05-14 MED ORDER — FERROUS SULFATE 325 (65 FE) MG PO TABS
325.0000 mg | ORAL_TABLET | Freq: Every day | ORAL | Status: DC
  Administered 2020-05-15: 325 mg via ORAL
  Filled 2020-05-14: qty 1

## 2020-05-14 MED ORDER — HEPARIN (PORCINE) IN NACL 1000-0.9 UT/500ML-% IV SOLN
INTRAVENOUS | Status: DC | PRN
  Administered 2020-05-14: 500 mL

## 2020-05-14 MED ORDER — LORATADINE 10 MG PO TABS
10.0000 mg | ORAL_TABLET | Freq: Every evening | ORAL | Status: DC
  Administered 2020-05-14: 10 mg via ORAL
  Filled 2020-05-14: qty 1

## 2020-05-14 MED ORDER — FLUOXETINE HCL 20 MG PO CAPS
20.0000 mg | ORAL_CAPSULE | Freq: Every day | ORAL | Status: DC
  Administered 2020-05-15: 20 mg via ORAL
  Filled 2020-05-14: qty 1

## 2020-05-14 MED ORDER — CHLORHEXIDINE GLUCONATE 4 % EX LIQD
60.0000 mL | Freq: Once | CUTANEOUS | Status: DC
  Filled 2020-05-14: qty 60

## 2020-05-14 MED ORDER — MIDAZOLAM HCL 5 MG/5ML IJ SOLN
INTRAMUSCULAR | Status: DC | PRN
  Administered 2020-05-14 (×2): 1 mg via INTRAVENOUS

## 2020-05-14 MED ORDER — GLIPIZIDE ER 10 MG PO TB24
10.0000 mg | ORAL_TABLET | Freq: Every day | ORAL | Status: DC
  Administered 2020-05-15: 10 mg via ORAL
  Filled 2020-05-14: qty 1

## 2020-05-14 MED ORDER — VANCOMYCIN HCL IN DEXTROSE 1-5 GM/200ML-% IV SOLN
1000.0000 mg | INTRAVENOUS | Status: AC
  Administered 2020-05-14: 1000 mg via INTRAVENOUS
  Filled 2020-05-14: qty 200

## 2020-05-14 SURGICAL SUPPLY — 12 items
CABLE SURGICAL S-101-97-12 (CABLE) ×2 IMPLANT
CATH RIGHTSITE C315HIS02 (CATHETERS) ×2 IMPLANT
IPG PACE AZUR XT DR MRI W1DR01 (Pacemaker) ×1 IMPLANT
LEAD CAPSURE NOVUS 5076-52CM (Lead) ×2 IMPLANT
LEAD SELECT SECURE 3830 383069 (Lead) ×1 IMPLANT
PACE AZURE XT DR MRI W1DR01 (Pacemaker) ×2 IMPLANT
PAD PRO RADIOLUCENT 2001M-C (PAD) ×2 IMPLANT
SELECT SECURE 3830 383069 (Lead) ×2 IMPLANT
SHEATH 7FR PRELUDE SNAP 13 (SHEATH) ×4 IMPLANT
SLITTER 6232ADJ (MISCELLANEOUS) ×2 IMPLANT
TRAY PACEMAKER INSERTION (PACKS) ×2 IMPLANT
WIRE HI TORQ VERSACORE-J 145CM (WIRE) ×2 IMPLANT

## 2020-05-14 NOTE — H&P (Addendum)
ELECTROPHYSIOLOGY CONSULT NOTE    Patient ID: Frank [name] MRN: [id number], DOB/AGE: [birth year] 79 y.o.  Admit date: 05/14/2020 Date of Consult: 05/14/2020  Primary Physician: Janith Lima, MD Primary Cardiologist: No primary care provider on file.  Electrophysiologist: New to Dr. Lovena Le  Reason for admission: Symptomatic bradycardia   Patient Profile: Frank [name] is a 79 y.o. male with a history of TBI, DM2 who is being seen today for the evaluation of symptomatic bradycardia at the request of Dr. Sherry Ruffing.  HPI:  Frank [name] is a 79 y.o. male with medical history above. He was seen in [facility] 11/1 with lightheadedness and a fall. He denied injury or LOC. EMS picked him up, mild abrasion to elbow but nothing else. He reported general weakness for "some time", and thought maybe he hadn't been drinking enough water.   He was given IV with gentle IVF re-hydration and discharged to home.   He became dizzy and lightheaded again today and HH noted HR in the mid 30s. EMS called. He was given 1L of fluids and 0.5 mg of atropine.   EKG shows 2:1 AV block with sinus bradycardia. He is not on AV nodal blockade. Previous Echo 06/2017 LVEF 55-60% with grade 1 DD.  He is feeling OK currently at rest. Was his USOH until yesterday and the event described above. He denies any frank syncope, now or previously.  He denies any chest pain or palpitations. No peripheral edema, weight gain, or early satiety. Denies any AV nodal blockade.  Of note, he is currently on ABX for a lesion on his left shoulder blade that is being treated as cellulitis with omnicef.   Past Medical History:  Diagnosis Date  . Allergy    Rhinitis  . Anemia    NOS iron deficient and B12 deficient  . Arthritis   . Diabetes mellitus    Type 2  . GERD (gastroesophageal reflux disease)   . Hyperlipidemia   . Hypertension   . 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 OSA (obstructive sleep apnea)      cpap  . TBI (traumatic brain injury) [facility])      Surgical History:  Past Surgical History:  Procedure Laterality Date  . APPENDECTOMY    . arm fracture Left 2011   with hardware  . BURR HOLE Bilateral 08/04/2017   Procedure: Haskell Flirt;  Surgeon: [name], MD;  Location: [place];  Service: Neurosurgery;  Laterality: Bilateral;  . COLONOSCOPY    . CRANIOTOMY Left 12/01/2017   Procedure: Trudee Kuster Holes CRANIOTOMY HEMATOMA EVACUATION SUBDURAL;  Surgeon: [name], MD;  Location: [place];  Service: Neurosurgery;  Laterality: Left;  . ESOPHAGOGASTRODUODENOSCOPY  2006   gastritis  . ROTATOR CUFF REPAIR    . SEPTOPLASTY  1959   Deviated Septum  . 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 Lisinopril Cough  . Penicillins     REACTION: unspecified  Allergy as a child.  Patient does not remember reaction.  Has had cephalasporins without problems.  . Sulfamethoxazole     REACTION: as child    Social History   Socioeconomic History  . Marital status: Married    Spouse name: Not on file  . Number of children: 1  . Years of education: Not on file  . Highest education level: Not on file  Occupational History  .  Not on file  Tobacco Use  . Smoking status: Former Smoker    Packs/day: 1.00    Quit date: 07/14/1983    Years since quitting: 36.8  . Smokeless tobacco: Never Used  Vaping Use  . Vaping Use: Never used  Substance and Sexual Activity  . Alcohol use: Yes    Alcohol/week: 2.0 standard drinks    Types: 2 Standard drinks or equivalent per week    Comment: occassionally  . Drug use: No  . Sexual activity: Never  Other Topics Concern  . Not on file  Social History Narrative  . Not on file   Social Determinants of Health   Financial Resource Strain: Low Risk   . Difficulty of Paying Living Expenses: Not hard at all  Food Insecurity:   . Worried About Sales executive in the Last Year: Not on file  . Ran Out of Food in the Last Year: Not on file  Transportation Needs:   . Lack of Transportation (Medical): Not on file  . Lack of Transportation (Non-Medical): Not on file  Physical Activity:   . Days of Exercise per Week: Not on file  . Minutes of Exercise per Session: Not on file  Stress:   . Feeling of Stress : Not on file  Social Connections:   . Frequency of Communication with Friends and Family: Not on file  . Frequency of Social Gatherings with Friends and Family: Not on file  . Attends Religious Services: Not on file  . Active Member of Clubs or Organizations: Not on file  . Attends Archivist Meetings: Not on file  . Marital Status: Not on file  Intimate Partner Violence:   . Fear of Current or Ex-Partner: Not on file  . Emotionally Abused: Not on file  . Physically Abused: Not on file  . Sexually Abused: Not on file     Family History  Problem Relation Age of Onset  . Heart disease Mother   . Breast cancer Mother   . Stroke Father   . Allergies Father        Father and children  . Coronary artery disease Brother   . Diabetes Neg Hx   . Colon cancer Neg Hx      Review of Systems: All other systems reviewed and are otherwise negative except as noted above.  Physical Exam: Vitals:   05/14/20 1225 05/14/20 1226  BP:  (!) 152/59  Pulse:  (!) 46  Resp:  16  Temp:  97.7 F (36.5 C)  TempSrc:  Oral  SpO2: 97% 98%    GEN- The patient is elderly appearing, alert and oriented x 3 today.   HEENT: normocephalic, atraumatic; sclera clear, conjunctiva pink; hearing intact; oropharynx clear; neck supple Lungs- Clear to ausculation bilaterally, normal work of breathing.  No wheezes, rales, rhonchi Heart- Brady, but regular rate and rhythm, no murmurs, rubs or gallops GI- soft, non-tender, non-distended, bowel sounds present Extremities- no clubbing, cyanosis, or edema; DP/PT/radial pulses 2+ bilaterally MS- no  significant deformity or atrophy Skin- warm and dry, no rash or lesion Psych- euthymic mood, full affect Neuro- strength and sensation are intact  Labs:   Lab Results  Component Value Date   WBC 7.0 05/13/2020   HGB 11.6 (L) 05/13/2020   HCT 37.6 (L) 05/13/2020   MCV 86.2 05/13/2020   PLT 153 05/13/2020    Recent Labs  Lab 05/13/20 2130  NA 139  K 4.9  CL 100  CO2  26  BUN 42*  CREATININE 1.78*  CALCIUM 9.0  GLUCOSE 203*     Radiology/Studies: No results found.  EKG: today shows 2:1 AV block with sinus bradycardia at 44 bpm (personally reviewed)  TELEMETRY: 2:1 AV block with HRs in the 40s (personally reviewed)  Assessment/Plan: 1.  Symptomatic bradycardia; 2:1 AV block No AV nodal blockades.  No chest pain, Normal EF most recent Echo 06/2017 No recent illness though is being treated for possible cellulitis and a lesion on his left shoulder blade.  Explained risks, benefits, and alternatives to PPM implantation, including but not limited to bleeding, infection, pneumothorax, pericardial effusion, lead dislodgement, heart attack, stroke, or death.  Pt verbalized understanding and agrees to proceed. He would, however, like to speak to his wife first, who should be here soon.   2. Left shoulder lesion / ? Cellulitis Does slightly increase risk of infection. Will discuss with Dr. Lovena Le.    For questions or updates, please contact [place] Please consult [email or web address] for contact info under Cardiology/STEMI.  Jacalyn Lefevre, PA-C  05/14/2020 12:45 PM   EP Attending  Patient seen and examined. Agree with above. The patient presents with symptomatic 2:1 AV block on no AV node blocking drugs. He has not had syncope and denies anginal symptoms. I have discussed the indications/risks/benefits/goals/expectations of PPM insertion and he wishes to proceed.  Carleene Overlie Keller Mikels,MD

## 2020-05-14 NOTE — Progress Notes (Signed)
Pt. Arrived to the unit insertion site clean dry and intact, sling placed.

## 2020-05-14 NOTE — Interval H&P Note (Signed)
History and Physical Interval Note:  05/14/2020 4:07 PM  Frank [name]  has presented today for surgery, with the diagnosis of bradicardia.  The various methods of treatment have been discussed with the patient and family. After consideration of risks, benefits and other options for treatment, the patient has consented to  Procedure(s): PACEMAKER IMPLANT (N/A) as a surgical intervention.  The patient's history has been reviewed, patient examined, no change in status, stable for surgery.  I have reviewed the patient's chart and labs.  Questions were answered to the patient's satisfaction.     Cristopher Peru

## 2020-05-14 NOTE — ED Provider Notes (Signed)
[facility] EMERGENCY DEPARTMENT Provider Note   CSN: [id number] Arrival date & time: 05/14/20  1209     History Chief Complaint  Patient presents with  . Bradycardia    Frank [name] is a 79 y.o. male with history of hypertension, diabetes, chronic kidney disease, burr holes craniotomy for hematoma evacuation/subdural brought to the ED by EMS from home for evaluation of abnormal heart rate.  History obtained from EMS who states that patient was  was seen in the ED for a fall.  Patient remembers sitting on his chair and going down falling to the ground. Does not remember prodromal symptoms or if he felt light-headed.  Has walked in his house today and denies palpitations, near syncope, syncope, CP, SOB.  He was told he was dehydrated and discharged.  Home nurse followed up today and obtain vital signs at home and noted that patient's heart rate was in the high 30s.  EMS was called at that time for this reason.  Per EMS, patient's heart rate was initially in the high 30s.  They gave him 0.5 mg of atropine on route and his heart rate improved to the high 40s.  Patient has been asymptomatic en route.  Patient tells me he feels well.  Reports some chronic weakness that comes and goes for a very long time but not particularly worse lately.  Denies recent illnesses.  Denies nausea, vomiting, diarrhea.  No urinary problems.  No upper respiratory infection symptoms.  No dysuria.  Per EMS patient was recently started on losartan.  Patient has no complaints at this time.   HPI     Past Medical History:  Diagnosis Date  . Allergy    Rhinitis  . Anemia    NOS iron deficient and B12 deficient  . Arthritis   . Diabetes mellitus    Type 2  . GERD (gastroesophageal reflux disease)   . Hyperlipidemia   . Hypertension   . 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 OSA (obstructive sleep apnea)    cpap  . TBI (traumatic brain injury) [facility])     Patient Active Problem List     Diagnosis Date Noted  . Cellulitis of back except buttock 05/09/2020  . Wound infection, posttraumatic 05/09/2020  . Contusion of scalp 02/01/2020  . 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 Chronic renal disease, stage 3, moderately decreased glomerular filtration rate (GFR) between 30-59 mL/min/1.73 square meter ([place]) 11/21/2018  . GERD without esophagitis 07/22/2017  . Depression   . Type 2 diabetes mellitus with peripheral neuropathy ([name])   . OAB (overactive bladder) 11/26/2014  . Routine general medical examination at a health care facility 04/06/2014  . Vitamin D deficiency 07/23/2010  . Obstructive sleep apnea 09/13/2008  . Hyperlipidemia with target LDL less than 100 07/27/2007  . BPH (benign prostatic hyperplasia) 06/21/2007  . 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 B12 deficiency anemia 04/15/2006  . Essential hypertension 04/15/2006  . Allergic rhinitis 04/15/2006    Past Surgical History:  Procedure Laterality Date  . APPENDECTOMY    . arm fracture Left 2011   with hardware  . BURR HOLE Bilateral 08/04/2017   Procedure: Haskell Flirt;  Surgeon: [name], MD;  Location: [place];  Service: Neurosurgery;  Laterality: Bilateral;  . COLONOSCOPY    . CRANIOTOMY Left 12/01/2017   Procedure: Trudee Kuster Holes CRANIOTOMY HEMATOMA EVACUATION SUBDURAL;  Surgeon: [name], MD;  Location: [place];  Service:  Neurosurgery;  Laterality: Left;  . ESOPHAGOGASTRODUODENOSCOPY  2006   gastritis  . ROTATOR CUFF REPAIR    . SEPTOPLASTY  1959   Deviated Septum  . 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 Heart disease Mother   . Breast cancer Mother   . Stroke Father   . Allergies Father        Father and children  . Coronary artery disease Brother   . Diabetes Neg Hx   . Colon cancer Neg Hx     Social History   Tobacco Use  . Smoking status: Former Smoker    Packs/day:  1.00    Quit date: 07/14/1983    Years since quitting: 36.8  . Smokeless tobacco: Never Used  Vaping Use  . Vaping Use: Never used  Substance Use Topics  . Alcohol use: Yes    Alcohol/week: 2.0 standard drinks    Types: 2 Standard drinks or equivalent per week    Comment: occassionally  . Drug use: No    Home Medications Prior to Admission medications   Medication Sig Start Date End Date Taking? Authorizing Provider  atorvastatin (LIPITOR) 20 MG tablet TAKE ONE-HALF TABLET BY  MOUTH DAILY 12/24/19   Janith Lima, MD  azelastine (ASTELIN) 0.1 % nasal spray Place 1 spray into both nostrils 2 (two) times daily. Use in each nostril as directed 11/27/19   Janith Lima, MD  Carbonyl Iron 25 MG TABS Take 25 mg by mouth daily.    [provider]  cefdinir (OMNICEF) 250 MG/5ML suspension Take 5 mLs (250 mg total) by mouth 2 (two) times daily for 6 days. 05/10/20 05/16/20  Janith Lima, MD  Cholecalciferol (VITAMIN D3) 50 MCG (2000 UT) TABS Take 1 tablet by mouth once daily 09/05/19   Janith Lima, MD  doxazosin (CARDURA) 4 MG tablet TAKE ONE-HALF TABLET BY  MOUTH AT BEDTIME 12/24/19   Janith Lima, MD  FLUoxetine (PROZAC) 20 MG capsule Take 1 capsule (20 mg total) by mouth daily. 04/10/20   Janith Lima, MD  fluticasone (FLONASE) 50 MCG/ACT nasal spray Place 1 spray into both nostrils daily as needed for allergies or rhinitis.    [provider]  glipiZIDE (GLUCOTROL XL) 10 MG 24 hr tablet Take 1 tablet (10 mg total) by mouth daily. 04/10/20   Janith Lima, MD  Glucagon (GVOKE HYPOPEN 2-PACK) 1 MG/0.2ML SOAJ Inject 1 Act into the skin daily as needed. 04/30/20   Janith Lima, MD  insulin glargine, 2 Unit Dial, (TOUJEO MAX SOLOSTAR) 300 UNIT/ML Solostar Pen Inject 10 Units into the skin daily. 04/16/20   Janith Lima, MD  Insulin Pen Needle 32G X 6 MM MISC 1 Act by Does not apply route daily. 04/08/20   Janith Lima, MD  levocetirizine (XYZAL) 5 MG tablet Take  1 tablet (5 mg total) by mouth every evening. 11/27/19   Janith Lima, MD  pantoprazole (PROTONIX) 40 MG tablet TAKE 1 TABLET BY MOUTH  DAILY 04/30/20   Janith Lima, MD  solifenacin (VESICARE) 10 MG tablet Take 1 tablet (10 mg total) by mouth daily. 04/10/20   Janith Lima, MD  telmisartan (MICARDIS) 20 MG tablet Take 1 tablet (20 mg total) by mouth daily. 05/02/20   Janith Lima, MD  triamcinolone cream (KENALOG) 0.1 %  07/26/19   [provider]  amLODipine (NORVASC) 10  MG tablet TAKE 1 TABLET BY MOUTH  DAILY 07/20/19   Janith Lima, MD  atorvastatin (LIPITOR) 20 MG tablet TAKE ONE-HALF TABLET BY  MOUTH DAILY 07/20/19   Janith Lima, MD  Cholecalciferol (VITAMIN D3) 50 MCG (2000 UT) TABS Take 1 tablet by mouth once daily 06/05/19   Janith Lima, MD  doxazosin (CARDURA) 4 MG tablet TAKE ONE-HALF TABLET BY  MOUTH AT BEDTIME 07/20/19   Janith Lima, MD  telmisartan (MICARDIS) 80 MG tablet TAKE 1 TABLET BY MOUTH  DAILY 07/20/19   Janith Lima, MD    Allergies    Lisinopril, Penicillins, and Sulfamethoxazole  Review of Systems   Review of Systems  Neurological: Positive for weakness (generalized chronic).  All other systems reviewed and are negative.   Physical Exam Updated Vital Signs BP (!) 150/54   Pulse (!) 43   Temp 97.7 F (36.5 C) (Oral)   Resp 13   SpO2 99%   Physical Exam Vitals and nursing note reviewed.  Constitutional:      Appearance: He is well-developed.     Comments: NAD.  HENT:     Head: Normocephalic and atraumatic.     Right Ear: External ear normal.     Left Ear: External ear normal.     Nose: Nose normal.     Mouth/Throat:     Comments: MM are tacky  Eyes:     General: No scleral icterus.    Conjunctiva/sclera: Conjunctivae normal.  Cardiovascular:     Rate and Rhythm: Regular rhythm. Bradycardia present.     Heart sounds: Normal heart sounds. No murmur heard.      Comments: HR in the 40-mid 60s during encounter. Minimal  pitting edema pretibial, bilaterally  Pulmonary:     Effort: Pulmonary effort is normal.     Breath sounds: Normal breath sounds. No wheezing.  Abdominal:     Palpations: Abdomen is soft.     Tenderness: There is no abdominal tenderness.  Musculoskeletal:        General: Normal range of motion.     Cervical back: Normal range of motion and neck supple.  Skin:    General: Skin is warm and dry.     Capillary Refill: Capillary refill takes less than 2 seconds.     Comments: Left lateral elbow abrasion, full ROM of elbow without pain   Neurological:     Mental Status: He is alert and oriented to person, place, and time.     Comments:   Mental Status: Patient is awake, alert, oriented to person, place, year, and situation. Patient is able to give a clear and coherent history. Speech is fluent and clear without dysarthria or aphasia. No signs of neglect.  Cranial Nerves: I not tested II decreased left temporal visual field (patient reports this is chronic). PERRL.   III, IV, VI EOMs intact without ptosis or diplopia  V sensation to light touch intact in all 3 divisions of trigeminal nerve bilaterally  VII facial movements symmetric bilaterally VIII hearing intact to voice/conversation  IX, X no uvula deviation, symmetric rise of soft palate/uvula XI 5/5 SCM and trapezius strength bilaterally  XII tongue protrusion midline, symmetric L/R movements  Motor: Strength 5/5 in upper/lower extremities. Sensation to light touch intact in face, upper/lower extremities. No pronator drift. No leg drop.  Cerebellar: No ataxia with finger to nose.   Psychiatric:        Behavior: Behavior normal.  Thought Content: Thought content normal.        Judgment: Judgment normal.     ED Results / Procedures / Treatments   Labs (all labs ordered are listed, but only abnormal results are displayed) Labs Reviewed  CBC WITH DIFFERENTIAL/PLATELET - Abnormal; Notable for the following components:       Result Value   RBC 3.90 (*)    Hemoglobin 10.4 (*)    HCT 34.0 (*)    Platelets 125 (*)    All other components within normal limits  RESPIRATORY PANEL BY RT PCR (FLU A&B, COVID)  TSH  BRAIN NATRIURETIC PEPTIDE  MAGNESIUM    EKG EKG Interpretation  Date/Time:  Tuesday May 14 2020 12:29:14 EDT Ventricular Rate:  44 PR Interval:    QRS Duration: 105 QT Interval:  604 QTC Calculation: 517 R Axis:   30 Text Interpretation: Sinus bradycardia Borderline prolonged PR interval Prolonged QT interval concenr for heart block. No STEMI Confirmed by Antony Blackbird [phone]) on 05/14/2020 12:44:13 PM   Radiology No results found.  Procedures .Critical Care Performed by: Kinnie Feil, PA-C Authorized by: Kinnie Feil, PA-C   Critical care provider statement:    Critical care time (minutes):  45   Critical care was necessary to treat or prevent imminent or life-threatening deterioration of the following conditions: 2:1 AV block.   Critical care was time spent personally by me on the following activities:  Discussions with consultants, evaluation of patient's response to treatment, examination of patient, ordering and performing treatments and interventions, ordering and review of laboratory studies, ordering and review of radiographic studies, pulse oximetry, re-evaluation of patient's condition, obtaining history from patient or surrogate, review of old charts and development of treatment plan with patient or surrogate   I assumed direction of critical care for this patient from another provider in my specialty: no     (including critical care time)  Medications Ordered in ED Medications - No data to display  ED Course  I have reviewed the triage vital signs and the nursing notes.  Pertinent labs & imaging results that were available during my care of the patient were reviewed by me and considered in my medical decision making (see chart for details).  Clinical  Course as of May 14 1337  Tue May 14, 2020  1243 EDP Tegeler spoke to cardiology who confirms EKG shows some type of block/abnormality, cardiology will see.   [CG]    Clinical Course User Index [CG] Arlean Hopping   MDM Rules/Calculators/A&P                           79 y.o. yo with low heart rate.    Seen yesterday in ER for fall vs near syncope.   I obtained additional history from triage, nursing notes and review of medical chart.  Previous medical records available, nursing notes reviewed to obtain more history and assist with MDM  Chief complain involves an extensive number of treatment options and is a complaint that carries with it a high risk of complications and morbidity and mortality.    Differential diagnosis includes symptomatic bradycardia vs high risk block.   Labs ordered: CBC BMP TSH magnesium COVID swab  Imaging ordered: EKG  Ordered continuous cardiac and pulse oximeter    1335: I have personally visualized and interpreted ER diagnostic work up including labs and imaging.    EKG reviewed with EDP Tegeler, concern for block  especially in V1. Cardiology consulted, will come see patient.   Hemoglobin 10.4, from 11.6 yesterday in the ER. He was given 1 L IVF, and EMS gave patient more IVF today. Will need repeat. He denies melena, active bleeding.   I have re-evaluated the patient.   No clinical decline. HR continues to be in low 40s. BP stable and ~ 150s SBP.   Cardiology has evaluated patient in the ER and recommends admission for possible pacemaker insertion. Shared with EDP Tegeler.   Consults made in the ED: cardiology   Critical interventions in the ED: symptomatic  Bradycardia; 2:1 AV block Final Clinical Impression(s) / ED Diagnoses Final diagnoses:  Symptomatic bradycardia    Rx / DC Orders ED Discharge Orders    None       Kinnie Feil, PA-C 05/14/20 1338    Tegeler, Gwenyth Allegra, MD 05/14/20 1623

## 2020-05-14 NOTE — ED Triage Notes (Signed)
Patient seen at [facility] last night for fall. Patient was dc'd after fluid and told he was dehydrated. Today, patient was dizzy and lightheaded at home and had heart rate in the mid-30s. Patient was given 1L of fluids bolus and 0.5mg  atropine prior to arrival to ED. Patient is Alert and oriented. No hx of the SB before.

## 2020-05-14 NOTE — ED Notes (Signed)
Pt transferred to cath lab.

## 2020-05-15 ENCOUNTER — Encounter (HOSPITAL_COMMUNITY): Payer: Self-pay | Admitting: Internal Medicine

## 2020-05-15 ENCOUNTER — Ambulatory Visit (HOSPITAL_COMMUNITY): Payer: Medicare Other

## 2020-05-15 DIAGNOSIS — Z87891 Personal history of nicotine dependence: Secondary | ICD-10-CM | POA: Diagnosis not present

## 2020-05-15 DIAGNOSIS — Z882 Allergy status to sulfonamides status: Secondary | ICD-10-CM | POA: Diagnosis not present

## 2020-05-15 DIAGNOSIS — Z880 Allergy status to penicillin: Secondary | ICD-10-CM | POA: Diagnosis not present

## 2020-05-15 DIAGNOSIS — I441 Atrioventricular block, second degree: Secondary | ICD-10-CM | POA: Diagnosis not present

## 2020-05-15 DIAGNOSIS — R918 Other nonspecific abnormal finding of lung field: Secondary | ICD-10-CM | POA: Diagnosis not present

## 2020-05-15 DIAGNOSIS — Z20822 Contact with and (suspected) exposure to covid-19: Secondary | ICD-10-CM | POA: Diagnosis not present

## 2020-05-15 MED ORDER — ACETAMINOPHEN 325 MG PO TABS
325.0000 mg | ORAL_TABLET | ORAL | Status: DC | PRN
Start: 2020-05-15 — End: 2020-09-20

## 2020-05-15 MED ORDER — HYDRALAZINE HCL 25 MG PO TABS
25.0000 mg | ORAL_TABLET | Freq: Once | ORAL | Status: AC
  Administered 2020-05-15: 25 mg via ORAL
  Filled 2020-05-15: qty 1

## 2020-05-15 MED FILL — Lidocaine HCl Local Inj 1%: INTRAMUSCULAR | Qty: 80 | Status: AC

## 2020-05-15 NOTE — Discharge Summary (Addendum)
ELECTROPHYSIOLOGY PROCEDURE DISCHARGE SUMMARY    Patient ID: Frank [name],  MRN: [id number], DOB/AGE: January 21, 1941 79 y.o.  Admit date: 05/14/2020 Discharge date: 05/15/2020  Primary Care Physician: Janith Lima, MD  Primary Cardiologist: No primary care provider on file.  Electrophysiologist: New to Dr. 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 Lisinopril Cough  . Invokana [Canagliflozin] Other (See Comments)    Stopped by MD  . Penicillins Other (See Comments)    Allergic as a child.  Patient does not remember reaction.  Has had cephalasporins without problems.  . Sulfamethoxazole Other (See Comments)    Unknown reaction- from childhood     Procedures This Admission:  1.  Implantation of a Medtronic dual chamber PPM on 05/14/2020 by Dr. Lovena Le. The patient received a Medtronic model number [id number] PPM with model number 5076 right atrial lead and 3830 right ventricular lead. There were no immediate post procedure complications. 2.  CXR on 05/15/20 demonstrated no pneumothorax status post device implantation.   Brief HPI: Frank [name] is a 79 y.o. male was admitted for symptomatic bradycardia and electrophysiology team asked to see for consideration of PPM implantation.  Past medical history includes above.  The patient has had symptomatic bradycardia without reversible causes identified.  Risks, benefits, and alternatives to PPM implantation were reviewed with the patient who wished to proceed.   Hospital Course:  The patient was admitted and underwent implantation of a Medtronic dual chamber PPM with details as outlined above.  He was monitored on telemetry overnight which demonstrated NSR.  Left chest was without hematoma or ecchymosis.  The device was  interrogated and found to be functioning normally.  CXR was obtained and demonstrated no pneumothorax status post device implantation.  Wound care, arm mobility, and restrictions were reviewed with the patient.  The patient was examined and considered stable for discharge to home.    Pt not on anticoagulation.  HTN - resume amlodipine. PCP follow up.  Left shoulder lesion - He sees PCP tomorrow. Site slightly worrisome for underlying malignancy. ? Dermatology d/u if not improved on current therapy.   Physical Exam: Vitals:   05/15/20 0035 05/15/20 0458 05/15/20 0510 05/15/20 0825  BP: (!) 166/71 (!) 170/77  111/62  Pulse: 73 69  84  Resp: 18 14  20   Temp: 99 F (37.2 C) 98.6 F (37 C)  98.7 F (37.1 C)  TempSrc: Oral Oral  Oral  SpO2: 95% 97%  97%  Weight:   98.4 kg     GEN- The patient is well appearing, alert and oriented x 3 today.   HEENT: normocephalic, atraumatic; sclera clear, conjunctiva pink; hearing intact; oropharynx clear; neck supple, no JVP Lymph- no cervical lymphadenopathy Lungs- Clear to ausculation bilaterally, normal work of breathing.  No wheezes, rales, rhonchi Heart- Regular rate and rhythm, no murmurs, rubs or gallops, PMI not laterally displaced GI- soft, non-tender, non-distended, bowel sounds present, no hepatosplenomegaly Extremities- no clubbing, cyanosis, or edema; DP/PT/radial pulses 2+ bilaterally MS- no significant deformity or atrophy Skin- warm and dry, no rash or lesion, left chest without hematoma/ecchymosis Psych- euthymic mood, full affect Neuro- strength and sensation are intact   Labs:   Lab Results  Component Value Date   WBC 7.3 05/14/2020   HGB 10.4 (L) 05/14/2020   HCT 34.0 (L)  05/14/2020   MCV 87.2 05/14/2020   PLT 125 (L) 05/14/2020    Recent Labs  Lab 05/14/20 1329  NA 137  K 4.0  CL 106  CO2 24  BUN 36*  CREATININE 1.62*  CALCIUM 7.8*  GLUCOSE 166*    Discharge Medications:  Allergies as of 05/15/2020       Reactions   Lisinopril Cough   Invokana [canagliflozin] Other (See Comments)   Stopped by MD   Penicillins Other (See Comments)   Allergic as a child.  Patient does not remember reaction.  Has had cephalasporins without problems.   Sulfamethoxazole Other (See Comments)   Unknown reaction- from childhood      Medication List    TAKE these medications   acetaminophen 325 MG tablet Commonly known as: TYLENOL Take 1-2 tablets (325-650 mg total) by mouth every 4 (four) hours as needed for mild pain.   amLODipine 10 MG tablet Commonly known as: NORVASC Take 10 mg by mouth at bedtime.   atorvastatin 20 MG tablet Commonly known as: LIPITOR TAKE ONE-HALF TABLET BY  MOUTH DAILY   azelastine 0.1 % nasal spray Commonly known as: ASTELIN Place 1 spray into both nostrils 2 (two) times daily. Use in each nostril as directed   Carbonyl Iron 25 MG Tabs Take 25 mg by mouth daily with breakfast.   cefdinir 250 MG/5ML suspension Commonly known as: OMNICEF Take 5 mLs (250 mg total) by mouth 2 (two) times daily for 6 days.   doxazosin 4 MG tablet Commonly known as: CARDURA TAKE ONE-HALF TABLET BY  MOUTH AT BEDTIME   FLUoxetine 20 MG capsule Commonly known as: PROZAC Take 1 capsule (20 mg total) by mouth daily.   fluticasone 50 MCG/ACT nasal spray Commonly known as: FLONASE Place 1 spray into both nostrils daily as needed for allergies or rhinitis.   glipiZIDE 10 MG 24 hr tablet Commonly known as: GLUCOTROL XL Take 1 tablet (10 mg total) by mouth daily.   Gvoke HypoPen 2-Pack 1 MG/0.2ML Soaj Generic drug: Glucagon Inject 1 Act into the skin daily as needed.   Insulin Pen Needle 32G X 6 MM Misc 1 Act by Does not apply route daily.   levocetirizine 5 MG tablet Commonly known as: XYZAL Take 1 tablet (5 mg total) by mouth every evening.   oxybutynin 5 MG tablet Commonly known as: DITROPAN Take 5 mg by mouth 2 (two) times daily.   pantoprazole 40 MG tablet Commonly known as:  PROTONIX TAKE 1 TABLET BY MOUTH  DAILY   PRESCRIPTION MEDICATION CPAP- At bedtime   psyllium 58.6 % powder Commonly known as: METAMUCIL Take 1 packet by mouth daily as needed (for constipation- MIX AND DRINK).   solifenacin 10 MG tablet Commonly known as: VESIcare Take 1 tablet (10 mg total) by mouth daily.   telmisartan 20 MG tablet Commonly known as: MICARDIS Take 1 tablet (20 mg total) by mouth daily. What changed: when to take this   Toujeo Max SoloStar 300 UNIT/ML Solostar Pen Generic drug: insulin glargine (2 Unit Dial) Inject 10 Units into the skin daily. What changed: when to take this   triamcinolone cream 0.1 % Commonly known as: KENALOG Apply 1 application topically daily as needed (to affected areas).   Vitamin D3 50 MCG (2000 UT) Tabs Take 1 tablet by mouth once daily What changed: how much to take       Disposition:    Follow-up Information    Baldwin Jamaica, PA-C Follow up on  05/27/2020.   Specialty: Cardiology Why: at 215 for post pacer wound check  Contact information: [address] [place] [address] [phone]        [name], MD Follow up on 08/19/2020.   Specialty: Cardiology Why: at 3 pm for 3 month pacer check. Contact information: [address] [address] [phone]               Duration of Discharge Encounter: Greater than 30 minutes including physician time.  Jacalyn Lefevre, PA-C  05/15/2020 10:03 AM   EP Attending  Patient seen and examined. Agree with the findings as noted above. The patient is doing well after DDD PPM insertion. Interrogation under my direction demonstrates normal DDD PM function and CXR demonstrates stable lead position. His incision is without hematoma. He will be discharged home on his home meds. He will followup as noted above.   Carleene Overlie Antwian Santaana,MD

## 2020-05-15 NOTE — Plan of Care (Signed)
  Problem: Education: Goal: Knowledge of General Education information will improve Description: Including pain rating scale, medication(s)/side effects and non-pharmacologic comfort measures Outcome: Adequate for Discharge   Problem: Health Behavior/Discharge Planning: Goal: Ability to manage health-related needs will improve Outcome: Adequate for Discharge   Problem: Clinical Measurements: Goal: Ability to maintain clinical measurements within normal limits will improve Outcome: Adequate for Discharge Goal: Will remain free from infection Outcome: Adequate for Discharge Goal: Diagnostic test results will improve Outcome: Adequate for Discharge Goal: Respiratory complications will improve Outcome: Adequate for Discharge Goal: Cardiovascular complication will be avoided Outcome: Adequate for Discharge   Problem: Activity: Goal: Risk for activity intolerance will decrease Outcome: Adequate for Discharge   Problem: Nutrition: Goal: Adequate nutrition will be maintained Outcome: Adequate for Discharge   Problem: Coping: Goal: Level of anxiety will decrease Outcome: Adequate for Discharge   Problem: Elimination: Goal: Will not experience complications related to bowel motility Outcome: Adequate for Discharge Goal: Will not experience complications related to urinary retention Outcome: Adequate for Discharge   Problem: Pain Managment: Goal: General experience of comfort will improve Outcome: Adequate for Discharge   Problem: Safety: Goal: Ability to remain free from injury will improve Outcome: Adequate for Discharge   Problem: Skin Integrity: Goal: Risk for impaired skin integrity will decrease Outcome: Adequate for Discharge   Problem: Education: Goal: Knowledge of cardiac device and self-care will improve Outcome: Adequate for Discharge Goal: Ability to safely manage health related needs after discharge will improve Outcome: Adequate for Discharge Goal:  Individualized Educational Video(s) Outcome: Adequate for Discharge   Problem: Cardiac: Goal: Ability to achieve and maintain adequate cardiopulmonary perfusion will improve Outcome: Adequate for Discharge   

## 2020-05-15 NOTE — Progress Notes (Signed)
D/C instructions given and reviewed. Questions answered and encouraged to call with any further concerns. Tele and IV removed, tolerated well.

## 2020-05-15 NOTE — Discharge Instructions (Signed)
After Your Pacemaker   . You have a Medtronic Pacemaker  ACTIVITY . Do not lift your arm above shoulder height for 1 week after your procedure. After 7 days, you may progress as below.     Wednesday May 22, 2020  Thursday May 23, 2020 Friday May 24, 2020 Saturday May 25, 2020   . Do not lift, push, pull, or carry anything over 10 pounds with the affected arm until 6 weeks (Wednesday June 26, 2020 ) after your procedure.   . Do NOT DRIVE until you have been seen for your wound check, or as long as instructed by your healthcare provider.   . Ask your healthcare provider when you can go back to work   INCISION/Dressing . If you are on a blood thinner such as Coumadin, Xarelto, Eliquis, Plavix, or Pradaxa please confirm with your provider when this should be resumed.   . Monitor your Pacemaker site for redness, swelling, and drainage. Call the device clinic at [phone] if you experience these symptoms or fever/chills.  . If your incision is sealed with Steri-strips or staples, you may shower 10 days after your procedure or when told by your provider. Do not remove the steri-strips or let the shower hit directly on your site. You may wash around your site with soap and water.    [name] Avoid lotions, ointments, or perfumes over your incision until it is well-healed.  . You may use a hot tub or a pool AFTER your wound check appointment if the incision is completely closed.  [name] PAcemaker Alerts:  Some alerts are vibratory and others beep. These are NOT emergencies. Please call our office to let us know. If this occurs at night or on weekends, it can wait until the next business day. Send a remote transmission.  . If your device is capable of reading fluid status (for heart failure), you will be offered monthly monitoring to review this with you.   DEVICE MANAGEMENT . Remote monitoring is used to monitor your pacemaker from home. This monitoring is scheduled every 91  days by our office. It allows [place] to keep an eye on the functioning of your device to ensure it is working properly. You will routinely see your Electrophysiologist annually (more often if necessary).   . You should receive your ID card for your new device in 4-8 weeks. Keep this card with you at all times once received. Consider wearing a medical alert bracelet or necklace.  . Your Pacemaker may be MRI compatible. This will be discussed at your next office visit/wound check.  You should avoid contact with strong electric or magnetic fields.    Do not use amateur (ham) radio equipment or electric (arc) welding torches. MP3 player headphones with magnets should not be used. Some devices are safe to use if held at least 12 inches (30 cm) from your Pacemaker. These include power tools, lawn mowers, and speakers. If you are unsure if something is safe to use, ask your health care provider.   When using your cell phone, hold it to the ear that is on the opposite side from the Pacemaker. Do not leave your cell phone in a pocket over the Pacemaker.   You may safely use electric blankets, heating pads, computers, and microwave ovens.  Call the office right away if:  You have chest pain.  You feel more short of breath than you have felt before.  You feel more light-headed than you have felt before.  Your  incision starts to open up.  This information is not intended to replace advice given to you by your health care provider. Make sure you discuss any questions you have with your health care provider.

## 2020-05-16 ENCOUNTER — Other Ambulatory Visit: Payer: Self-pay

## 2020-05-16 ENCOUNTER — Ambulatory Visit (INDEPENDENT_AMBULATORY_CARE_PROVIDER_SITE_OTHER): Payer: Medicare Other | Admitting: Internal Medicine

## 2020-05-16 ENCOUNTER — Encounter: Payer: Self-pay | Admitting: Internal Medicine

## 2020-05-16 VITALS — BP 128/78 | HR 83 | Temp 98.1°F | Resp 16 | Ht 69.0 in | Wt 213.0 lb

## 2020-05-16 DIAGNOSIS — E1142 Type 2 diabetes mellitus with diabetic polyneuropathy: Secondary | ICD-10-CM | POA: Diagnosis not present

## 2020-05-16 DIAGNOSIS — L089 Local infection of the skin and subcutaneous tissue, unspecified: Secondary | ICD-10-CM | POA: Diagnosis not present

## 2020-05-16 DIAGNOSIS — T148XXA Other injury of unspecified body region, initial encounter: Secondary | ICD-10-CM | POA: Diagnosis not present

## 2020-05-16 DIAGNOSIS — I441 Atrioventricular block, second degree: Secondary | ICD-10-CM

## 2020-05-16 DIAGNOSIS — I10 Essential (primary) hypertension: Secondary | ICD-10-CM

## 2020-05-16 NOTE — Progress Notes (Signed)
Subjective:  Patient ID: Frank [name], male    DOB: Dec 02, 1940  Age: 79 y.o. MRN: [id number]  CC: Follow-up  This visit occurred during the SARS-CoV-2 public health emergency.  Safety protocols were in place, including screening questions prior to the visit, additional usage of staff PPE, and extensive cleaning of exam room while observing appropriate contact time as indicated for disinfecting solutions.    HPI Frank [name] presents for f/up- He was recently admitted for syncope and falls and was found to have Mobitz 2. He received a pacemaker and is feeling better. The area of infection above his left shoulder blade is healing well. He is taking the ceph antibiotic. Sivextro was too expensive.  Admit date: 05/14/2020 Discharge date: 05/15/2020  Primary Care Physician: Janith Lima, MD  Primary Cardiologist: No primary care provider on file.  Electrophysiologist: New to Dr. Lovena Le  Primary Discharge Diagnosis:  Symptomatic bradycardia status post pacemaker implantation this admission  Secondary Discharge Diagnosis:  Mobitz 2, second degree AV block Left should lesion HTN   Outpatient Medications Prior to Visit  Medication Sig Dispense Refill  . acetaminophen (TYLENOL) 325 MG tablet Take 1-2 tablets (325-650 mg total) by mouth every 4 (four) hours as needed for mild pain.    [name] amLODipine (NORVASC) 10 MG tablet Take 10 mg by mouth at bedtime.    [name] atorvastatin (LIPITOR) 20 MG tablet TAKE ONE-HALF TABLET BY  MOUTH DAILY (Patient taking differently: Take 10 mg by mouth daily. ) 45 tablet 1  . azelastine (ASTELIN) 0.1 % nasal spray Place 1 spray into both nostrils 2 (two) times daily. Use in each nostril as directed 90 mL 1  . Carbonyl Iron 25 MG TABS Take 25 mg by mouth daily with breakfast.     . cefdinir (OMNICEF) 250 MG/5ML suspension Take 5 mLs (250 mg total) by mouth 2 (two) times daily for 6 days. 60 mL 0  . Cholecalciferol (VITAMIN D3) 50 MCG (2000 UT)  TABS Take 1 tablet by mouth once daily (Patient taking differently: Take 2,000 Units by mouth daily. ) 90 tablet 1  . doxazosin (CARDURA) 4 MG tablet TAKE ONE-HALF TABLET BY  MOUTH AT BEDTIME (Patient taking differently: Take 2 mg by mouth at bedtime. ) 45 tablet 1  . FLUoxetine (PROZAC) 20 MG capsule Take 1 capsule (20 mg total) by mouth daily. 90 capsule 1  . fluticasone (FLONASE) 50 MCG/ACT nasal spray Place 1 spray into both nostrils daily as needed for allergies or rhinitis.    [name] glipiZIDE (GLUCOTROL XL) 10 MG 24 hr tablet Take 1 tablet (10 mg total) by mouth daily. 90 tablet 1  . Glucagon (GVOKE HYPOPEN 2-PACK) 1 MG/0.2ML SOAJ Inject 1 Act into the skin daily as needed. 2 mL 5  . insulin glargine, 2 Unit Dial, (TOUJEO MAX SOLOSTAR) 300 UNIT/ML Solostar Pen Inject 10 Units into the skin daily. (Patient taking differently: Inject 10 Units into the skin daily after breakfast. ) 6 mL 1  . Insulin Pen Needle 32G X 6 MM MISC 1 Act by Does not apply route daily. 100 each 1  . levocetirizine (XYZAL) 5 MG tablet Take 1 tablet (5 mg total) by mouth every evening. 90 tablet 1  . oxybutynin (DITROPAN) 5 MG tablet Take 5 mg by mouth 2 (two) times daily.    . pantoprazole (PROTONIX) 40 MG tablet TAKE 1 TABLET BY MOUTH  DAILY 90 tablet 1  . PRESCRIPTION MEDICATION CPAP- At bedtime    .  psyllium (METAMUCIL) 58.6 % powder Take 1 packet by mouth daily as needed (for constipation- MIX AND DRINK).     [name] solifenacin (VESICARE) 10 MG tablet Take 1 tablet (10 mg total) by mouth daily. 90 tablet 1  . telmisartan (MICARDIS) 20 MG tablet Take 1 tablet (20 mg total) by mouth daily. (Patient taking differently: Take 20 mg by mouth at bedtime. ) 90 tablet 1  . triamcinolone cream (KENALOG) 0.1 % Apply 1 application topically daily as needed (to affected areas).      No facility-administered medications prior to visit.    ROS Review of Systems  Constitutional: Negative.  Negative for diaphoresis and fatigue.  HENT:  Negative.   Eyes: Negative for visual disturbance.  Respiratory: Negative for cough, chest tightness, shortness of breath and wheezing.   Cardiovascular: Negative for chest pain, palpitations and leg swelling.  Gastrointestinal: Negative for abdominal pain, diarrhea, nausea and vomiting.  Endocrine: Negative.   Genitourinary: Negative.   Musculoskeletal: Positive for arthralgias. Negative for myalgias.  Skin: Negative.  Negative for color change and pallor.  Neurological: Negative.  Negative for dizziness, syncope and weakness.  Hematological: Negative for adenopathy. Does not bruise/bleed easily.    Objective:  BP 128/78   Pulse 83   Temp 98.1 F (36.7 C) (Oral)   Resp 16   Ht 5\' 9"  (1.753 m)   Wt 213 lb (96.6 kg)   SpO2 91%   BMI 31.45 kg/m   BP Readings from Last 3 Encounters:  05/16/20 128/78  05/15/20 111/62  05/13/20 (!) 119/55    Wt Readings from Last 3 Encounters:  05/16/20 213 lb (96.6 kg)  05/15/20 216 lb 14.9 oz (98.4 kg)  05/13/20 213 lb (96.6 kg)    Physical Exam Vitals reviewed.  HENT:     Nose: Nose normal.     Mouth/Throat:     Mouth: Mucous membranes are moist.  Eyes:     General: No scleral icterus.    Conjunctiva/sclera: Conjunctivae normal.  Cardiovascular:     Rate and Rhythm: Normal rate and regular rhythm.     Heart sounds: No murmur heard.   Pulmonary:     Effort: Pulmonary effort is normal.     Breath sounds: No stridor. No wheezing, rhonchi or rales.  Abdominal:     General: Abdomen is flat. Bowel sounds are normal. There is no distension.     Palpations: Abdomen is soft. There is no hepatomegaly, splenomegaly or mass.     Tenderness: There is no abdominal tenderness.     Hernia: No hernia is present.  Musculoskeletal:        General: Normal range of motion.       Arms:     Cervical back: Neck supple.     Right lower leg: No edema.     Left lower leg: No edema.  Lymphadenopathy:     Cervical: No cervical adenopathy.  Skin:     General: Skin is warm and dry.     Findings: No rash.  Neurological:     General: No focal deficit present.     Mental Status: He is alert.     Lab Results  Component Value Date   WBC 7.3 05/14/2020   HGB 10.4 (L) 05/14/2020   HCT 34.0 (L) 05/14/2020   PLT 125 (L) 05/14/2020   GLUCOSE 166 (H) 05/14/2020   CHOL 130 04/20/2019   TRIG 89.0 04/20/2019   HDL 46.10 04/20/2019   LDLCALC 66 04/20/2019   ALT  19 08/04/2017   AST 20 08/04/2017   NA 137 05/14/2020   K 4.0 05/14/2020   CL 106 05/14/2020   CREATININE 1.62 (H) 05/14/2020   BUN 36 (H) 05/14/2020   CO2 24 05/14/2020   TSH 1.479 05/14/2020   PSA 1.08 07/10/2015   INR 0.98 08/04/2017   HGBA1C 7.5 (A) 03/27/2020   MICROALBUR 67.2 04/23/2020    DG Chest 2 View  Result Date: 05/15/2020 CLINICAL DATA:  79 year old male with history of pacemaker placement. EXAM: CHEST - 2 VIEW COMPARISON:  Chest x-ray 01/28/2020. FINDINGS: New left-sided pacemaker device with lead tips projecting over the expected location of the right atrium and right ventricle. Lung volumes are low. There are some bibasilar linear opacities favored to reflect mild subsegmental atelectasis. No definite consolidative airspace disease. No pleural effusions. No pneumothorax. No evidence of pulmonary edema. Heart size is borderline enlarged. Upper mediastinal contours are within normal limits. Aortic atherosclerosis. Soft tissue anchor in the right humeral head, presumably from prior rotator cuff surgery. Postoperative changes of ORIF in the left proximal humerus incompletely imaged. IMPRESSION: 1. New left-sided pacemaker device appears appropriately located without evidence of pneumothorax or other immediate complicating features. 2. Probable dependent subsegmental atelectasis in the lower lobes of the lungs bilaterally. Electronically Signed   By: Vinnie Langton M.D.   On: 05/15/2020 08:19   EP PPM/ICD IMPLANT  Result Date: 05/14/2020 CONCLUSIONS:  1. Successful  implantation of a medtronic dual-chamber pacemaker for symptomatic bradycardia due to mobitz 2, second degree AV block.  2. No early apparent complications.       Cristopher Peru, MD 05/14/2020 5:26 PM    Assessment & Plan:   Jenkins was seen today for follow-up.  Diagnoses and all orders for this visit:  AV block, Mobitz 2- He is improved and stable s/p pacemaker.  Essential hypertension- His BP is well controlled.  Type 2 diabetes mellitus with peripheral neuropathy ([place])- His blood sugars are well controlled.  Wound infection, posttraumatic- Improvement noted. He will complete the antibiotic regimen.   I am having [name] [name] "ken" maintain his Carbonyl Iron, fluticasone, triamcinolone cream, Vitamin D3, azelastine, levocetirizine, atorvastatin, doxazosin, Insulin Pen Needle, solifenacin, FLUoxetine, glipiZIDE, Toujeo Max SoloStar, pantoprazole, Gvoke HypoPen 2-Pack, telmisartan, oxybutynin, amLODipine, psyllium, PRESCRIPTION MEDICATION, and acetaminophen.  No orders of the defined types were placed in this encounter.    Follow-up: No follow-ups on file.  Scarlette Calico, MD

## 2020-05-19 ENCOUNTER — Encounter: Payer: Self-pay | Admitting: Internal Medicine

## 2020-05-20 ENCOUNTER — Other Ambulatory Visit: Payer: Self-pay | Admitting: Internal Medicine

## 2020-05-20 ENCOUNTER — Encounter: Payer: Self-pay | Admitting: Internal Medicine

## 2020-05-20 DIAGNOSIS — N3281 Overactive bladder: Secondary | ICD-10-CM

## 2020-05-20 MED ORDER — GEMTESA 75 MG PO TABS: 1.0000 | ORAL_TABLET | Freq: Every day | ORAL | 1 refills | Status: DC

## 2020-05-22 ENCOUNTER — Telehealth: Payer: Self-pay | Admitting: Pharmacist

## 2020-05-22 NOTE — Progress Notes (Signed)
Faxed formulary exception for Gemtesa to [facility]  Given previous therapeutic failures with preferred products solifenacin, oxybutynin, Toviaz, and Myrbetriq.   Will await decision from [facility].

## 2020-05-24 ENCOUNTER — Encounter: Payer: Medicare Other | Admitting: Student

## 2020-05-26 NOTE — Progress Notes (Signed)
Cardiology Office Note Date:  05/27/2020  Patient ID:  Frank, [name] [birth year], MRN [id number] PCP:  Janith Lima, MD  Electrophysiologist: Dr. Lovena Le     Chief Complaint:  post PPM implant  History of Present Illness: Frank [name] is a 79 y.o. male with history of TBI, DM, HTN, HLD, GERD, arthritis.  He was admitted to [facility] 05/14/20, was previously seen in [facility] 11/1 with lightheadedness and a fall. He denied injury or LOC. EMS picked him up, mild abrasion to elbow but nothing else. He reported general weakness for "some time", and thought maybe he hadn't been drinking enough water.  He was given IV with gentle IVF re-hydration and discharged to home.   He became dizzy and lightheaded again 05/14/20 and HH noted HR in the mid 30s. EMS called. He was given 1L of fluids and 0.5 mg of atropine.  EKG showed 2:1 AV block with sinus bradycardia. He was on nodal blocking agents, prior echo 2018 with preserved LVEF.  He was on abx for L shoulder cellulitis at the time.  No reversible causes noted and underwent PPM implant 05/14/20. Discharged on 05/15/20.  TODAY He is accompanied by his wife She mentions a night or two after home he got out of bed and lost his balance, she called EMS very worried he may have done some damage, though they assured her that he likely did not.  He has not fallen again He says he lost his balance, and is certain was not near syncope or syncope. His wife says she wishes they had checked his heart sooner that he is much better since the pacer.  More steady, not as weak, tired. The patient denies any CP, palpitations, no SOB.   He completed the antibiotic for shoulder cellulitus, his wife says looks much better  Device information MDT dual chamber PPM implanted 05/14/20   Past Medical History:  Diagnosis Date  . Allergy    Rhinitis  . Anemia    NOS iron deficient and B12 deficient  . Arthritis   . 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  Diabetes mellitus    Type 2  . GERD (gastroesophageal reflux disease)   . Hyperlipidemia   . Hypertension   . 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 OSA (obstructive sleep apnea)    cpap  . TBI (traumatic brain injury) [facility])     Past Surgical History:  Procedure Laterality Date  . APPENDECTOMY    . arm fracture Left 2011   with hardware  . BURR HOLE Bilateral 08/04/2017   Procedure: Haskell Flirt;  Surgeon: [name], MD;  Location: [place];  Service: Neurosurgery;  Laterality: Bilateral;  . COLONOSCOPY    . CRANIOTOMY Left 12/01/2017   Procedure: Trudee Kuster Holes CRANIOTOMY HEMATOMA EVACUATION SUBDURAL;  Surgeon: [name], MD;  Location: [place];  Service: Neurosurgery;  Laterality: Left;  . ESOPHAGOGASTRODUODENOSCOPY  2006   gastritis  . PACEMAKER IMPLANT N/A 05/14/2020   Procedure: PACEMAKER IMPLANT;  Surgeon: [name], MD;  Location: [place] [facility];  Service: Cardiovascular;  Laterality: N/A;  . ROTATOR CUFF REPAIR    . SEPTOPLASTY  1959   Deviated Septum  . 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 acetaminophen (TYLENOL) 325 MG tablet Take 1-2 tablets (325-650 mg total) by mouth every 4 (four) hours as needed for mild pain.    [name] amLODipine (NORVASC)  10 MG tablet Take 10 mg by mouth at bedtime.    [name] atorvastatin (LIPITOR) 20 MG tablet TAKE ONE-HALF TABLET BY  MOUTH DAILY (Patient taking differently: Take 10 mg by mouth daily. ) 45 tablet 1  . azelastine (ASTELIN) 0.1 % nasal spray Place 1 spray into both nostrils 2 (two) times daily. Use in each nostril as directed 90 mL 1  . Carbonyl Iron 25 MG TABS Take 25 mg by mouth daily with breakfast.     . Cholecalciferol (VITAMIN D3) 50 MCG (2000 UT) TABS Take 1 tablet by mouth once daily (Patient taking differently: Take 2,000 Units by mouth daily. ) 90 tablet 1  . doxazosin (CARDURA) 4 MG tablet TAKE ONE-HALF TABLET BY  MOUTH AT BEDTIME (Patient taking differently:  Take 2 mg by mouth at bedtime. ) 45 tablet 1  . FLUoxetine (PROZAC) 20 MG capsule Take 1 capsule (20 mg total) by mouth daily. 90 capsule 1  . fluticasone (FLONASE) 50 MCG/ACT nasal spray Place 1 spray into both nostrils daily as needed for allergies or rhinitis.    [name] glipiZIDE (GLUCOTROL XL) 10 MG 24 hr tablet Take 1 tablet (10 mg total) by mouth daily. 90 tablet 1  . Glucagon (GVOKE HYPOPEN 2-PACK) 1 MG/0.2ML SOAJ Inject 1 Act into the skin daily as needed. 2 mL 5  . insulin glargine, 2 Unit Dial, (TOUJEO MAX SOLOSTAR) 300 UNIT/ML Solostar Pen Inject 10 Units into the skin daily. (Patient taking differently: Inject 10 Units into the skin daily after breakfast. ) 6 mL 1  . Insulin Pen Needle 32G X 6 MM MISC 1 Act by Does not apply route daily. 100 each 1  . levocetirizine (XYZAL) 5 MG tablet Take 1 tablet (5 mg total) by mouth every evening. 90 tablet 1  . pantoprazole (PROTONIX) 40 MG tablet TAKE 1 TABLET BY MOUTH  DAILY 90 tablet 1  . PRESCRIPTION MEDICATION CPAP- At bedtime    . psyllium (METAMUCIL) 58.6 % powder Take 1 packet by mouth daily as needed (for constipation- MIX AND DRINK).     [name] solifenacin (VESICARE) 10 MG tablet Take 1 tablet (10 mg total) by mouth daily. 90 tablet 1  . telmisartan (MICARDIS) 20 MG tablet Take 1 tablet (20 mg total) by mouth daily. (Patient taking differently: Take 20 mg by mouth at bedtime. ) 90 tablet 1  . triamcinolone cream (KENALOG) 0.1 % Apply 1 application topically daily as needed (to affected areas).     . Vibegron (GEMTESA) 75 MG TABS Take 1 tablet by mouth daily. 90 tablet 1   No current facility-administered medications for this visit.    Allergies:   Lisinopril, Invokana [canagliflozin], Penicillins, and Sulfamethoxazole   Social History:  The patient  reports that he quit smoking about 36 years ago. He smoked 1.00 pack per day. He has never used smokeless tobacco. He reports current alcohol use of about 2.0 standard drinks of alcohol per week. He  reports that he does not use drugs.   Family History:  The patient's family history includes Allergies in his father; Breast cancer in his mother; Coronary artery disease in his brother; Heart disease in his mother; Stroke in his father.  ROS:  Please see the history of present illness.    All other systems are reviewed and otherwise negative.   PHYSICAL EXAM:  VS:  BP 122/70   Pulse 65   Ht 5\' 9"  (1.753 m)   Wt 212 lb 12.8 oz (96.5 kg)  SpO2 98%   BMI 31.43 kg/m  BMI: Body mass index is 31.43 kg/m. Well nourished, well developed, in no acute distress HEENT: normocephalic, atraumatic Neck: no JVD, carotid bruits or masses Cardiac:  RRR; no significant murmurs, no rubs, or gallops Lungs:  CTA b/l, no wheezing, rhonchi or rales Abd: soft, nontender MS: no deformity or atrophy Ext: trace edema Skin: warm and dry, no rash.  L shoulder (posterior) healing/scabbed wound, small area of erythema sorrounding (reported as significantly improved) Neuro:  No gross deficits appreciated Psych: euthymic mood, full affect  PPM site is healing nicely, wound edges are approximated well.  No bleeding, drainage.  No wounds There is a stitch towards the lateral edge of the wound that was trimmed.  o erythema, edema or increased heat to the surrounding tissues.  No evidence of infection.   EKG:  Not done today  Device interrogation done today and reviewed by myself:  Battery and lead measurements are good Morphology change in RV lead at 3.0V, LOC occurs at 1.0V 93.9%VP MVP is off   06/24/2017; TTE Study Conclusions  - Left ventricle: The cavity size was normal. There was mild  concentric hypertrophy. Systolic function was normal. The  estimated ejection fraction was in the range of 55% to 60%. There  was an increased relative contribution of atrial contraction to  ventricular filling. Doppler parameters are consistent with  abnormal left ventricular relaxation (grade 1 diastolic    dysfunction).  - Aortic valve: Mildly calcified leaflets. There was trivial  regurgitation.  - Mitral valve: Calcified annulus. Valve area by pressure  half-time: 2 cm^2.  - Left atrium: The atrium was mildly dilated.  - Pulmonary arteries: Systolic pressure could not be accurately  estimated.     Recent Labs: 05/14/2020: B Natriuretic Peptide 393.4; BUN 36; Creatinine, Ser 1.62; Hemoglobin 10.4; Magnesium 1.7; Platelets 125; Potassium 4.0; Sodium 137; TSH 1.479  No results found for requested labs within last 8760 hours.   Estimated Creatinine Clearance: 41 mL/min (A) (by C-G formula based on SCr of 1.62 mg/dL (H)).   Wt Readings from Last 3 Encounters:  05/27/20 212 lb 12.8 oz (96.5 kg)  05/16/20 213 lb (96.6 kg)  05/15/20 216 lb 14.9 oz (98.4 kg)     Other studies reviewed: Additional studies/records reviewed today include: summarized above  ASSESSMENT AND PLAN:  1. Advanced heart block >> PPM     Intact function, no programming changes made     Acute implant outputs remain  Stitch noted and trimmed, site is clean, dry, instructed to keep clean and dry, notify if any concerns, changes  2. HTN     Looks OK, no changes  Disposition: F/u with Dr. Lovena Le as scheduled, sooner if needed  Current medicines are reviewed at length with the patient today.  The patient did not have any concerns regarding medicines.  Venetia Night, PA-C 05/27/2020 4:53 PM     [place]  [id number] [phone] (office)  [phone] (fax)

## 2020-05-27 ENCOUNTER — Encounter: Payer: Self-pay | Admitting: Physician Assistant

## 2020-05-27 ENCOUNTER — Other Ambulatory Visit: Payer: Self-pay

## 2020-05-27 ENCOUNTER — Ambulatory Visit: Payer: Medicare Other | Admitting: Physician Assistant

## 2020-05-27 VITALS — BP 122/70 | HR 65 | Ht 69.0 in | Wt 212.8 lb

## 2020-05-27 DIAGNOSIS — I441 Atrioventricular block, second degree: Secondary | ICD-10-CM | POA: Diagnosis not present

## 2020-05-27 DIAGNOSIS — I10 Essential (primary) hypertension: Secondary | ICD-10-CM | POA: Diagnosis not present

## 2020-05-27 DIAGNOSIS — Z950 Presence of cardiac pacemaker: Secondary | ICD-10-CM

## 2020-05-27 DIAGNOSIS — Z5189 Encounter for other specified aftercare: Secondary | ICD-10-CM

## 2020-05-27 NOTE — Patient Instructions (Addendum)
Medication Instructions:   Your physician recommends that you continue on your current medications as directed. Please refer to the Current Medication list given to you today.  *If you need a refill on your cardiac medications before your next appointment, please call your pharmacy*   Lab Work: NONE ORDERED  TODAY   If you have labs (blood work) drawn today and your tests are completely normal, you will receive your results only by: . MyChart Message (if you have MyChart) OR . A paper copy in the mail If you have any lab test that is abnormal or we need to change your treatment, we will call you to review the results.   Testing/Procedures: NONE ORDERED  TODAY    Follow-Up: At [facility], you and your health needs are our priority.  As part of our continuing mission to provide you with exceptional heart care, we have created designated Provider Care Teams.  These Care Teams include your primary Cardiologist (physician) and Advanced Practice Providers (APPs -  Physician Assistants and Nurse Practitioners) who all work together to provide you with the care you need, when you need it.  We recommend signing up for the patient portal called "MyChart".  Sign up information is provided on this After Visit Summary.  MyChart is used to connect with patients for Virtual Visits (Telemedicine).  Patients are able to view lab/test results, encounter notes, upcoming appointments, etc.  Non-urgent messages can be sent to your provider as well.   To learn more about what you can do with MyChart, go to [email or web address].    Your next appointment:  AS SCHEDULED   Other Instructions   

## 2020-05-28 ENCOUNTER — Encounter: Payer: Self-pay | Admitting: Internal Medicine

## 2020-05-30 ENCOUNTER — Telehealth: Payer: Self-pay | Admitting: Pharmacist

## 2020-05-30 NOTE — Progress Notes (Signed)
Submitted formulary exception for Gemtesa to [facility].  Patient has tried both formulary alternatives - solifenacin and oxybutynin - without resolution of symptoms. He has also tried and failed Myrbetriq and Toviaz in the past.

## 2020-05-31 ENCOUNTER — Encounter: Payer: Self-pay | Admitting: Family

## 2020-05-31 ENCOUNTER — Other Ambulatory Visit: Payer: Self-pay

## 2020-05-31 ENCOUNTER — Ambulatory Visit (INDEPENDENT_AMBULATORY_CARE_PROVIDER_SITE_OTHER): Payer: Medicare Other | Admitting: Family

## 2020-05-31 VITALS — BP 130/70 | HR 77 | Temp 98.5°F | Ht 69.0 in | Wt 210.0 lb

## 2020-05-31 DIAGNOSIS — L03116 Cellulitis of left lower limb: Secondary | ICD-10-CM | POA: Diagnosis not present

## 2020-05-31 MED ORDER — MUPIROCIN 2 % EX OINT: 1.0000 "application " | TOPICAL_OINTMENT | Freq: Two times a day (BID) | CUTANEOUS | 0 refills | Status: DC

## 2020-05-31 MED ORDER — DOXYCYCLINE HYCLATE 100 MG PO TABS: 100.0000 mg | ORAL_TABLET | Freq: Two times a day (BID) | ORAL | 0 refills | Status: DC

## 2020-05-31 NOTE — Progress Notes (Signed)
Frank [name] is a 79 y.o. male with the following history as recorded in EpicCare:  Patient Active Problem List   Diagnosis Date Noted  . 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 Wound infection, posttraumatic 05/09/2020  . 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 Chronic renal disease, stage 3, moderately decreased glomerular filtration rate (GFR) between 30-59 mL/min/1.73 square meter ([place]) 11/21/2018  . GERD without esophagitis 07/22/2017  . Depression   . Type 2 diabetes mellitus with peripheral neuropathy ([name])   . OAB (overactive bladder) 11/26/2014  . Routine general medical examination at a health care facility 04/06/2014  . Vitamin D deficiency 07/23/2010  . Obstructive sleep apnea 09/13/2008  . Hyperlipidemia with target LDL less than 100 07/27/2007  . BPH (benign prostatic hyperplasia) 06/21/2007  . 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 B12 deficiency anemia 04/15/2006  . Essential hypertension 04/15/2006  . Allergic rhinitis 04/15/2006    Current Outpatient Medications  Medication Sig Dispense Refill  . acetaminophen (TYLENOL) 325 MG tablet Take 1-2 tablets (325-650 mg total) by mouth every 4 (four) hours as needed for mild pain.    [name] amLODipine (NORVASC) 10 MG tablet Take 10 mg by mouth at bedtime.    [name] atorvastatin (LIPITOR) 20 MG tablet TAKE ONE-HALF TABLET BY  MOUTH DAILY (Patient taking differently: Take 10 mg by mouth daily. ) 45 tablet 1  . azelastine (ASTELIN) 0.1 % nasal spray Place 1 spray into both nostrils 2 (two) times daily. Use in each nostril as directed 90 mL 1  . Carbonyl Iron 25 MG TABS Take 25 mg by mouth daily with breakfast.     . Cholecalciferol (VITAMIN D3) 50 MCG (2000 UT) TABS Take 1 tablet by mouth once daily (Patient taking differently: Take 2,000 Units by mouth daily. ) 90 tablet 1  . doxazosin (CARDURA) 4 MG tablet TAKE ONE-HALF TABLET BY  MOUTH AT BEDTIME (Patient taking  differently: Take 2 mg by mouth at bedtime. ) 45 tablet 1  . FLUoxetine (PROZAC) 20 MG capsule Take 1 capsule (20 mg total) by mouth daily. 90 capsule 1  . fluticasone (FLONASE) 50 MCG/ACT nasal spray Place 1 spray into both nostrils daily as needed for allergies or rhinitis.    [name] glipiZIDE (GLUCOTROL XL) 10 MG 24 hr tablet Take 1 tablet (10 mg total) by mouth daily. 90 tablet 1  . Glucagon (GVOKE HYPOPEN 2-PACK) 1 MG/0.2ML SOAJ Inject 1 Act into the skin daily as needed. 2 mL 5  . insulin glargine, 2 Unit Dial, (TOUJEO MAX SOLOSTAR) 300 UNIT/ML Solostar Pen Inject 10 Units into the skin daily. (Patient taking differently: Inject 10 Units into the skin daily after breakfast. ) 6 mL 1  . Insulin Pen Needle 32G X 6 MM MISC 1 Act by Does not apply route daily. 100 each 1  . levocetirizine (XYZAL) 5 MG tablet Take 1 tablet (5 mg total) by mouth every evening. 90 tablet 1  . pantoprazole (PROTONIX) 40 MG tablet TAKE 1 TABLET BY MOUTH  DAILY 90 tablet 1  . PRESCRIPTION MEDICATION CPAP- At bedtime    . psyllium (METAMUCIL) 58.6 % powder Take 1 packet by mouth daily as needed (for constipation- MIX AND DRINK).     [name] solifenacin (VESICARE) 10 MG tablet Take 1 tablet (10 mg total) by mouth daily. 90 tablet 1  . telmisartan (MICARDIS) 20 MG tablet Take 1 tablet (20 mg total) by mouth daily. (Patient taking differently: Take 20 mg  by mouth at bedtime. ) 90 tablet 1  . triamcinolone cream (KENALOG) 0.1 % Apply 1 application topically daily as needed (to affected areas).     . Vibegron (GEMTESA) 75 MG TABS Take 1 tablet by mouth daily. 90 tablet 1  . doxycycline (VIBRA-TABS) 100 MG tablet Take 1 tablet (100 mg total) by mouth 2 (two) times daily. 14 tablet 0  . mupirocin ointment (BACTROBAN) 2 % Apply 1 application topically 2 (two) times daily. 22 g 0   No current facility-administered medications for this visit.    Allergies: Lisinopril, Invokana [canagliflozin], Penicillins, and Sulfamethoxazole  Past  Medical History:  Diagnosis Date  . Allergy    Rhinitis  . Anemia    NOS iron deficient and B12 deficient  . Arthritis   . 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 Diabetes mellitus    Type 2  . GERD (gastroesophageal reflux disease)   . Hyperlipidemia   . Hypertension   . 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 OSA (obstructive sleep apnea)    cpap  . TBI (traumatic brain injury) [facility])     Past Surgical History:  Procedure Laterality Date  . APPENDECTOMY    . arm fracture Left 2011   with hardware  . BURR HOLE Bilateral 08/04/2017   Procedure: Haskell Flirt;  Surgeon: [name], MD;  Location: [facility];  Service: Neurosurgery;  Laterality: Bilateral;  . COLONOSCOPY    . CRANIOTOMY Left 12/01/2017   Procedure: Trudee Kuster Holes CRANIOTOMY HEMATOMA EVACUATION SUBDURAL;  Surgeon: [name], MD;  Location: [facility];  Service: Neurosurgery;  Laterality: Left;  . ESOPHAGOGASTRODUODENOSCOPY  2006   gastritis  . 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 ROTATOR CUFF REPAIR    . SEPTOPLASTY  1959   Deviated Septum  . 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 Heart disease Mother   . Breast cancer Mother   . Stroke Father   . Allergies Father        Father and children  . Coronary artery disease Brother   . Diabetes Neg Hx   . Colon cancer Neg Hx     Social History   Tobacco Use  . Smoking status: Former Smoker    Packs/day: 1.00    Quit date: 07/14/1983    Years since quitting: 36.9  . Smokeless tobacco: Never Used  Substance Use Topics  . Alcohol use: Yes    Alcohol/week: 2.0 standard drinks    Types: 2 Standard drinks or equivalent per week    Comment: occassionally    Subjective:  Patient is concerned about non-healing sore on his left knee x 6 months; notes that some type of re-injury occurs and the wound is  re-injured; did just recently complete a course of Ceftin which did not help with the wound; no fever or drainage or streaking noted;   Objective:  Vitals:   05/31/20 0900  BP: 130/70  Pulse: 77  Temp: 98.5 F (36.9 C)  TempSrc: Oral  SpO2: 96%  Weight: 210 lb (95.3 kg)  Height: 5\' 9"  (1.753 m)    General: Well developed, well nourished, in no acute distress  Skin : Warm and dry. Small wound over left knee with surrounding erythema Head: Normocephalic and atraumatic  Lungs: Respirations unlabored;  Neurologic: Alert  and oriented; speech intact; face symmetrical;   Assessment:  1. Cellulitis of left lower extremity     Plan:  Present x 6 months; Rx for Doxycycline 100 mg bid x 7 days; Rx for Bactroban bid to affected area; follow-up worse, no better;   This visit occurred during the SARS-CoV-2 public health emergency.  Safety protocols were in place, including screening questions prior to the visit, additional usage of staff PPE, and extensive cleaning of exam room while observing appropriate contact time as indicated for disinfecting solutions.     No follow-ups on file.  No orders of the defined types were placed in this encounter.   Requested Prescriptions   Signed Prescriptions Disp Refills  . doxycycline (VIBRA-TABS) 100 MG tablet 14 tablet 0    Sig: Take 1 tablet (100 mg total) by mouth 2 (two) times daily.  . mupirocin ointment (BACTROBAN) 2 % 22 g 0    Sig: Apply 1 application topically 2 (two) times daily.

## 2020-06-03 ENCOUNTER — Other Ambulatory Visit: Payer: Self-pay | Admitting: Internal Medicine

## 2020-06-03 DIAGNOSIS — N3281 Overactive bladder: Secondary | ICD-10-CM

## 2020-06-03 MED ORDER — MIRABEGRON ER 25 MG PO TB24: 25.0000 mg | ORAL_TABLET | Freq: Every day | ORAL | 1 refills | Status: DC

## 2020-06-05 ENCOUNTER — Other Ambulatory Visit: Payer: Self-pay | Admitting: Internal Medicine

## 2020-06-05 DIAGNOSIS — E785 Hyperlipidemia, unspecified: Secondary | ICD-10-CM

## 2020-06-05 DIAGNOSIS — N401 Enlarged prostate with lower urinary tract symptoms: Secondary | ICD-10-CM

## 2020-06-05 DIAGNOSIS — E118 Type 2 diabetes mellitus with unspecified complications: Secondary | ICD-10-CM

## 2020-06-05 DIAGNOSIS — I10 Essential (primary) hypertension: Secondary | ICD-10-CM

## 2020-07-09 ENCOUNTER — Telehealth: Payer: Self-pay | Admitting: Pharmacist

## 2020-07-17 ENCOUNTER — Other Ambulatory Visit: Payer: Self-pay

## 2020-07-17 ENCOUNTER — Ambulatory Visit (INDEPENDENT_AMBULATORY_CARE_PROVIDER_SITE_OTHER): Payer: Medicare Other

## 2020-07-17 DIAGNOSIS — E538 Deficiency of other specified B group vitamins: Secondary | ICD-10-CM | POA: Diagnosis not present

## 2020-07-17 DIAGNOSIS — G4733 Obstructive sleep apnea (adult) (pediatric): Secondary | ICD-10-CM | POA: Diagnosis not present

## 2020-07-17 MED ORDER — CYANOCOBALAMIN 1000 MCG/ML IJ SOLN
1000.0000 ug | INTRAMUSCULAR | Status: DC
  Administered 2020-07-17 – 2020-10-17 (×3): 1000 ug via INTRAMUSCULAR

## 2020-07-17 NOTE — Progress Notes (Signed)
Pt here for monthly B12 injection per Dr Yetta Barre.  B12 given IM right ventrogluteal per pt request and pt tolerated injection well.  Pt to scheduled next B12 injection upon check out

## 2020-07-25 NOTE — Progress Notes (Signed)
Chronic Care Management Pharmacy Assistant   Name: Frank [name]  MRN: [id number] DOB: 08/04/1940  Reason for Encounter: Hypertension Adherence Call   PCP : Janith Lima, MD  Allergies:   Allergies  Allergen Reactions   Lisinopril Cough   Invokana [Canagliflozin] Other (See Comments)    Stopped by MD   Penicillins Other (See Comments)    Allergic as a child.  Patient does not remember reaction.  Has had cephalasporins without problems.   Sulfamethoxazole Other (See Comments)    Unknown reaction- from childhood    Medications: Outpatient Encounter Medications as of 07/09/2020  Medication Sig Note   acetaminophen (TYLENOL) 325 MG tablet Take 1-2 tablets (325-650 mg total) by mouth every 4 (four) hours as needed for mild pain.    amLODipine (NORVASC) 10 MG tablet Take 10 mg by mouth at bedtime.    atorvastatin (LIPITOR) 20 MG tablet TAKE ONE-HALF TABLET BY  MOUTH DAILY    azelastine (ASTELIN) 0.1 % nasal spray Place 1 spray into both nostrils 2 (two) times daily. Use in each nostril as directed    Carbonyl Iron 25 MG TABS Take 25 mg by mouth daily with breakfast.     Cholecalciferol (VITAMIN D3) 50 MCG (2000 UT) TABS Take 1 tablet by mouth once daily (Patient taking differently: Take 2,000 Units by mouth daily. )    doxazosin (CARDURA) 4 MG tablet TAKE ONE-HALF TABLET BY  MOUTH AT BEDTIME    doxycycline (VIBRA-TABS) 100 MG tablet Take 1 tablet (100 mg total) by mouth 2 (two) times daily.    FLUoxetine (PROZAC) 20 MG capsule Take 1 capsule (20 mg total) by mouth daily.    fluticasone (FLONASE) 50 MCG/ACT nasal spray Place 1 spray into both nostrils daily as needed for allergies or rhinitis.    glipiZIDE (GLUCOTROL XL) 10 MG 24 hr tablet Take 1 tablet (10 mg total) by mouth daily.    Glucagon (GVOKE HYPOPEN 2-PACK) 1 MG/0.2ML SOAJ Inject 1 Act into the skin daily as needed.    insulin glargine, 2 Unit Dial, (TOUJEO MAX SOLOSTAR) 300 UNIT/ML Solostar Pen  Inject 10 Units into the skin daily. (Patient taking differently: Inject 10 Units into the skin daily after breakfast. ) 05/14/2020: Regimen confirmed to be accurate by the patient   Insulin Pen Needle 32G X 6 MM MISC 1 Act by Does not apply route daily.    levocetirizine (XYZAL) 5 MG tablet Take 1 tablet (5 mg total) by mouth every evening.    mirabegron ER (MYRBETRIQ) 25 MG TB24 tablet Take 1 tablet (25 mg total) by mouth daily.    mupirocin ointment (BACTROBAN) 2 % Apply 1 application topically 2 (two) times daily.    pantoprazole (PROTONIX) 40 MG tablet TAKE 1 TABLET BY MOUTH  DAILY    PRESCRIPTION MEDICATION CPAP- At bedtime    psyllium (METAMUCIL) 58.6 % powder Take 1 packet by mouth daily as needed (for constipation- MIX AND DRINK).     solifenacin (VESICARE) 10 MG tablet Take 1 tablet (10 mg total) by mouth daily.    telmisartan (MICARDIS) 20 MG tablet Take 1 tablet (20 mg total) by mouth daily. (Patient taking differently: Take 20 mg by mouth at bedtime. )    triamcinolone cream (KENALOG) 0.1 % Apply 1 application topically daily as needed (to affected areas).     No facility-administered encounter medications on file as of 07/09/2020.    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 ([place]) 11/28/2019   Chronic renal disease, stage 3, moderately decreased glomerular filtration rate (GFR) between 30-59 mL/min/1.73 square meter ([place]) 11/21/2018   GERD without esophagitis 07/22/2017   Depression    Type 2 diabetes mellitus with peripheral neuropathy ([place])    OAB (overactive bladder) 11/26/2014   Routine general medical examination at a health care facility 04/06/2014   Vitamin D deficiency 07/23/2010   Obstructive sleep apnea 09/13/2008   Hyperlipidemia with target LDL less than 100  07/27/2007   BPH (benign prostatic hyperplasia) 06/21/2007   OPTIC NEUROPATHY, 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   B12 deficiency anemia 04/15/2006   Essential hypertension 04/15/2006   Allergic rhinitis 04/15/2006    Goals Addressed   None     Follow-Up:  Pharmacist Review   Reviewed chart prior to disease state call. Spoke with patient regarding BP  Recent Office Vitals: BP Readings from Last 3 Encounters:  05/31/20 130/70  05/27/20 122/70  05/16/20 128/78   Pulse Readings from Last 3 Encounters:  05/31/20 77  05/27/20 65  05/16/20 83    Wt Readings from Last 3 Encounters:  05/31/20 210 lb (95.3 kg)  05/27/20 212 lb 12.8 oz (96.5 kg)  05/16/20 213 lb (96.6 kg)     Kidney Function Lab Results  Component Value Date/Time   CREATININE 1.62 (H) 05/14/2020 01:29 PM   CREATININE 1.78 (H) 05/13/2020 09:30 PM   GFR 33.56 (L) 12/12/2019 02:50 PM   GFRNONAA 42 (L) 05/14/2020 01:29 PM   GFRAA 30 (L) 12/08/2019 10:50 PM    BMP Latest Ref Rng & Units 05/14/2020 05/13/2020 04/23/2020  Glucose 70 - 99 mg/dL 166(H) 203(H) -  BUN 8 - 23 mg/dL 36(H) 42(H) 27(A)  Creatinine 0.61 - 1.24 mg/dL 1.62(H) 1.78(H) 1.5(A)  Sodium 135 - 145 mmol/L 137 139 139  Potassium 3.5 - 5.1 mmol/L 4.0 4.9 4.6  Chloride 98 - 111 mmol/L 106 100 103  CO2 22 - 32 mmol/L 24 26 29(A)  Calcium 8.9 - 10.3 mg/dL 7.8(L) 9.0 -     Current antihypertensive regimen: The patient regime for Hypertension is Doxazosin, Amlodipine, Telmisartan but not sure if he is suppose to be taking them   How often are you checking your Blood Pressure? The patient states that he checks his blood pressure every day   Current home BP readings: The patient gave blood pressure reading for today which was 161/82, yesterday 161/81, the day before 173/74   What recent interventions/DTPs have been made by any provider to improve Blood Pressure control since last CPP Visit: The patient states that he checks his blood pressure all the time  and takes his blood pressure medication. The patient states that he is confused about some of his medications and would like for Mendel Ryder to email ( [email or web address]) him a list of his medications and when is a good time for him to take them   Any recent hospitalizations or ED visits since last visit with CPP? The patient states that he was in the hospital for a pacemaker implant   What diet changes have been made to improve Blood Pressure Control? The patient states that he is not on any special diet for his blood perssure   What exercise is being done to improve your Blood Pressure Control? The patient states that that he does not exercise    Adherence Review: Is the patient currently on  ACE/ARB medication? Yes, Telmisartan Does the patient have >5 day gap between last estimated fill dates? No   Wendy Poet, [place] [phone]

## 2020-08-02 ENCOUNTER — Telehealth: Payer: Self-pay | Admitting: Internal Medicine

## 2020-08-02 NOTE — Telephone Encounter (Signed)
[facility] FYI   Caller is having low blood pressure. Current reading is 98/50 while sitting and standing is 89/47. He is supposed to be going to physical therapy in 10 minutes. He says he is letharic.   [facility] advised: Go to ED now  Patient understood and decided to contact his cardiologist.

## 2020-08-02 NOTE — Telephone Encounter (Signed)
Returned call to Pt's wife.  We discussed Pt's blood pressure.  Appears Pt is having some orthostatic hypotension.  Highest blood pressure reported by wife was 132/69.  Significant blood pressure drop when he stands.  Discussed that Pt may be on too much blood pressure medication.  Advised Dr. Lovena Le not available until next week to discuss.  Pt's wife ok with this.  Advised to use compression hose (he has some) and change position slowly.  Will follow up with Dr. Lovena Le.

## 2020-08-02 NOTE — Telephone Encounter (Signed)
Pt c/o BP issue: STAT if pt c/o blurred vision, one-sided weakness or slurred speech  1. What are your last 5 BP readings? 98/50 89/47, 126/64 106/57- all of these were this morning, it dropped more when he stood  2. Are you having any other symptoms (ex. Dizziness, headache, blurred vision, passed out)? Really tired  3. What is your BP issue? Blood pressure going real low and goes back up

## 2020-08-02 NOTE — Telephone Encounter (Signed)
Spoke with patient and assisted him in sending transmission and it was sent successfully. I let patient know a nurse will call him back to go over the transmission

## 2020-08-02 NOTE — Telephone Encounter (Signed)
Manual transmission received.  Normal device function.  Presenting rhythm ASVP 70s No arrhythmias recorded.  Spoke with pt, advised that transmission does not show anything to explain his symptoms, will forward for review with his BP data.

## 2020-08-02 NOTE — Telephone Encounter (Signed)
I spoke with patient, his wife and his care giver.  Patient's BP usually runs 148-150/80's.  This AM it was lower than usual -109/49, heart rate 80 so they checked more frequently.  Readings from this AM 8:20-98/50, 74 89/47 when standing 8:50-132/69 9:00- 126/64, 70 9:30-127/65--106/57 when standing. Patient is tired but otherwise feels fine.  No dizziness.  Reports history of falls.  He takes doxazosin and telmisartan at night.  Reports timing of medications recently changed by PCP and he is not sure if he is taking amlodipine  I advised him to change positions slowly and make sure he stays hydrated.  Will send remote transmission to make sure no issues with pacemaker.  He does not know how to send transmission.  Will ask device clinic to assist with transmission

## 2020-08-05 NOTE — Telephone Encounter (Signed)
Returned phone call to Pt.  After further discussion, Pt had low blood pressures for ONE day and he was asymptomatic.  Not sure what caused Pt's wife to check orthostatic blood pressures.  Pt states since that day he is has felt fine and blood pressures have been on the higher side.  Per Pt he stopped amlodipine a few months ago per Dr. Ronnald Ramp, but I cannot see where Dr. Ronnald Ramp discontinued this medication.  Pt will restart amlodipine.  Pt will continue current medications and contact this office if he has any other issues with SYMPTOMATIC low blood pressure.  Pt indicates understanding.

## 2020-08-08 ENCOUNTER — Other Ambulatory Visit: Payer: Self-pay | Admitting: Internal Medicine

## 2020-08-08 DIAGNOSIS — N3281 Overactive bladder: Secondary | ICD-10-CM

## 2020-08-12 ENCOUNTER — Telehealth: Payer: Self-pay | Admitting: Pharmacist

## 2020-08-12 NOTE — Progress Notes (Signed)
Chronic Care Management Pharmacy Assistant   Name: Frank [name]  MRN: [id number] DOB: 1941/06/02  Reason for Encounter: Chart Review   PCP : Janith Lima, MD  Allergies:   Allergies  Allergen Reactions   Lisinopril Cough   Invokana [Canagliflozin] Other (See Comments)    Stopped by MD   Penicillins Other (See Comments)    Allergic as a child.  Patient does not remember reaction.  Has had cephalasporins without problems.   Sulfamethoxazole Other (See Comments)    Unknown reaction- from childhood    Medications: Outpatient Encounter Medications as of 08/12/2020  Medication Sig Note   acetaminophen (TYLENOL) 325 MG tablet Take 1-2 tablets (325-650 mg total) by mouth every 4 (four) hours as needed for mild pain.    amLODipine (NORVASC) 10 MG tablet Take 10 mg by mouth at bedtime.    atorvastatin (LIPITOR) 20 MG tablet TAKE ONE-HALF TABLET BY  MOUTH DAILY    azelastine (ASTELIN) 0.1 % nasal spray Place 1 spray into both nostrils 2 (two) times daily. Use in each nostril as directed    Carbonyl Iron 25 MG TABS Take 25 mg by mouth daily with breakfast.     Cholecalciferol (VITAMIN D3) 50 MCG (2000 UT) TABS Take 1 tablet by mouth once daily (Patient taking differently: Take 2,000 Units by mouth daily. )    doxazosin (CARDURA) 4 MG tablet TAKE ONE-HALF TABLET BY  MOUTH AT BEDTIME    doxycycline (VIBRA-TABS) 100 MG tablet Take 1 tablet (100 mg total) by mouth 2 (two) times daily.    FLUoxetine (PROZAC) 20 MG capsule Take 1 capsule (20 mg total) by mouth daily.    fluticasone (FLONASE) 50 MCG/ACT nasal spray Place 1 spray into both nostrils daily as needed for allergies or rhinitis.    glipiZIDE (GLUCOTROL XL) 10 MG 24 hr tablet Take 1 tablet (10 mg total) by mouth daily.    Glucagon (GVOKE HYPOPEN 2-PACK) 1 MG/0.2ML SOAJ Inject 1 Act into the skin daily as needed.    insulin glargine, 2 Unit Dial, (TOUJEO MAX SOLOSTAR) 300 UNIT/ML Solostar Pen Inject 10 Units  into the skin daily. (Patient taking differently: Inject 10 Units into the skin daily after breakfast. ) 05/14/2020: Regimen confirmed to be accurate by the patient   Insulin Pen Needle 32G X 6 MM MISC 1 Act by Does not apply route daily.    levocetirizine (XYZAL) 5 MG tablet Take 1 tablet (5 mg total) by mouth every evening.    mirabegron ER (MYRBETRIQ) 25 MG TB24 tablet Take 1 tablet (25 mg total) by mouth daily.    mupirocin ointment (BACTROBAN) 2 % Apply 1 application topically 2 (two) times daily.    pantoprazole (PROTONIX) 40 MG tablet TAKE 1 TABLET BY MOUTH  DAILY    PRESCRIPTION MEDICATION CPAP- At bedtime    psyllium (METAMUCIL) 58.6 % powder Take 1 packet by mouth daily as needed (for constipation- MIX AND DRINK).     solifenacin (VESICARE) 10 MG tablet TAKE 1 TABLET BY MOUTH  DAILY    telmisartan (MICARDIS) 20 MG tablet Take 1 tablet (20 mg total) by mouth daily. (Patient taking differently: Take 20 mg by mouth at bedtime. )    triamcinolone cream (KENALOG) 0.1 % Apply 1 application topically daily as needed (to affected areas).     Facility-Administered Encounter Medications as of 08/12/2020  Medication   cyanocobalamin ((VITAMIN B-12)) injection 1,000 mcg    Current Diagnosis: Patient Active Problem List  Diagnosis Date Noted   AV block, 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   Wound infection, posttraumatic 05/09/2020   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 ([place]) 11/28/2019   Chronic renal disease, stage 3, moderately decreased glomerular filtration rate (GFR) between 30-59 mL/min/1.73 square meter ([place]) 11/21/2018   GERD without esophagitis 07/22/2017   Depression    Type 2 diabetes mellitus with peripheral neuropathy ([place])    OAB (overactive bladder) 11/26/2014   Routine general medical examination at a health care facility 04/06/2014   Vitamin D deficiency 07/23/2010   Obstructive sleep apnea 09/13/2008   Hyperlipidemia  with target LDL less than 100 07/27/2007   BPH (benign prostatic hyperplasia) 06/21/2007   OPTIC NEUROPATHY, 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   B12 deficiency anemia 04/15/2006   Essential hypertension 04/15/2006   Allergic rhinitis 04/15/2006    Goals Addressed   None     Follow-Up:  Pharmacist Review   Reviewed chart for medication changes and adherence.    No gaps in adherence identified. Patient has follow up scheduled with pharmacy team. No further action required.    Wendy Poet, [place] [phone]

## 2020-08-13 ENCOUNTER — Telehealth: Payer: Self-pay | Admitting: Internal Medicine

## 2020-08-13 NOTE — Telephone Encounter (Signed)
Sent mychart message

## 2020-08-13 NOTE — Telephone Encounter (Signed)
Pt c/o BP issue: STAT if pt c/o blurred vision, one-sided weakness or slurred speech  1. What are your last 5 BP readings?  1/31/- 157/80 2/1- 167/80-8:30am  195/80 9:20  2. Are you having any other symptoms (ex. Dizziness, headache, blurred vision, passed out)? Pt denies any symptoms as of right now.    3. What is your BP issue? He had PT appt this morning and they would not treat him because his bp was so high.  They told him that he needed to contact his  cardiologist    Best number [phone]

## 2020-08-14 ENCOUNTER — Ambulatory Visit (INDEPENDENT_AMBULATORY_CARE_PROVIDER_SITE_OTHER): Payer: Medicare Other

## 2020-08-14 DIAGNOSIS — I441 Atrioventricular block, second degree: Secondary | ICD-10-CM

## 2020-08-14 LAB — CUP PACEART REMOTE DEVICE CHECK
Battery Remaining Longevity: 152 mo
Battery Voltage: 3.2 V
Brady Statistic AP VP Percent: 7.01 %
Brady Statistic AP VS Percent: 0 %
Brady Statistic AS VP Percent: 92.92 %
Brady Statistic AS VS Percent: 0.07 %
Brady Statistic RA Percent Paced: 7.02 %
Brady Statistic RV Percent Paced: 99.92 %
Date Time Interrogation Session: 20220201191546
Implantable Lead Implant Date: 20211102
Implantable Lead Implant Date: 20211102
Implantable Lead Location: 753859
Implantable Lead Location: 753860
Implantable Lead Model: 3830
Implantable Lead Model: 5076
Implantable Pulse Generator Implant Date: 20211102
Lead Channel Impedance Value: 342 Ohm
Lead Channel Impedance Value: 456 Ohm
Lead Channel Impedance Value: 475 Ohm
Lead Channel Impedance Value: 703 Ohm
Lead Channel Pacing Threshold Amplitude: 0.625 V
Lead Channel Pacing Threshold Amplitude: 0.75 V
Lead Channel Pacing Threshold Pulse Width: 0.4 ms
Lead Channel Pacing Threshold Pulse Width: 0.4 ms
Lead Channel Sensing Intrinsic Amplitude: 15.5 mV
Lead Channel Sensing Intrinsic Amplitude: 15.5 mV
Lead Channel Sensing Intrinsic Amplitude: 3.5 mV
Lead Channel Sensing Intrinsic Amplitude: 3.5 mV
Lead Channel Setting Pacing Amplitude: 1.5 V
Lead Channel Setting Pacing Amplitude: 2 V
Lead Channel Setting Pacing Pulse Width: 0.4 ms
Lead Channel Setting Sensing Sensitivity: 1.2 mV

## 2020-08-14 NOTE — Telephone Encounter (Signed)
Per Dr. Lovena Le- have Pt continue current medications and bring BP log to his upcoming appt.  Pt aware.

## 2020-08-19 ENCOUNTER — Encounter: Payer: Self-pay | Admitting: Internal Medicine

## 2020-08-19 ENCOUNTER — Ambulatory Visit (INDEPENDENT_AMBULATORY_CARE_PROVIDER_SITE_OTHER): Payer: Medicare Other | Admitting: Internal Medicine

## 2020-08-19 ENCOUNTER — Other Ambulatory Visit: Payer: Self-pay

## 2020-08-19 DIAGNOSIS — I441 Atrioventricular block, second degree: Secondary | ICD-10-CM | POA: Diagnosis not present

## 2020-08-19 NOTE — Progress Notes (Signed)
Remote pacemaker transmission.   

## 2020-08-19 NOTE — Patient Instructions (Signed)
Medication Instructions:  Your physician recommends that you continue on your current medications as directed. Please refer to the Current Medication list given to you today.  *If you need a refill on your cardiac medications before your next appointment, please call your pharmacy*   Lab Work: none If you have labs (blood work) drawn today and your tests are completely normal, you will receive your results only by: MyChart Message (if you have MyChart) OR A paper copy in the mail If you have any lab test that is abnormal or we need to change your treatment, we will call you to review the results.   Testing/Procedures: none   Follow-Up: At [facility], you and your health needs are our priority.  As part of our continuing mission to provide you with exceptional heart care, we have created designated Provider Care Teams.  These Care Teams include your primary Cardiologist (physician) and Advanced Practice Providers (APPs -  Physician Assistants and Nurse Practitioners) who all work together to provide you with the care you need, when you need it.  We recommend signing up for the patient portal called "MyChart".  Sign up information is provided on this After Visit Summary.  MyChart is used to connect with patients for Virtual Visits (Telemedicine).  Patients are able to view lab/test results, encounter notes, upcoming appointments, etc.  Non-urgent messages can be sent to your provider as well.   To learn more about what you can do with MyChart, go to [email or web address].    Your next appointment:   1 year(s)  The format for your next appointment:   In Person  Provider:   Gregg Taylor, MD   Other Instructions   

## 2020-08-19 NOTE — Progress Notes (Signed)
HPI [name] returns today for followup. He is a pleasant 80 yo man with a h/o CHB, s/p PPM insertion. He has labile bp's and his meds have been adjusted. He has preserved LV function. He does have occaisions of weakness which are likely due to low bp.  Allergies  Allergen Reactions  . Lisinopril Cough  . Invokana [Canagliflozin] Other (See Comments)    Stopped by MD  . Penicillins Other (See Comments)    Allergic as a child.  Patient does not remember reaction.  Has had cephalasporins without problems.  . Sulfamethoxazole Other (See Comments)    Unknown reaction- from childhood     Current Outpatient Medications  Medication Sig Dispense Refill  . acetaminophen (TYLENOL) 325 MG tablet Take 1-2 tablets (325-650 mg total) by mouth every 4 (four) hours as needed for mild pain.    [name] amLODipine (NORVASC) 10 MG tablet Take 10 mg by mouth at bedtime.    [name] atorvastatin (LIPITOR) 20 MG tablet TAKE ONE-HALF TABLET BY  MOUTH DAILY 45 tablet 3  . azelastine (ASTELIN) 0.1 % nasal spray Place 1 spray into both nostrils 2 (two) times daily. Use in each nostril as directed 90 mL 1  . Carbonyl Iron 25 MG TABS Take 25 mg by mouth daily with breakfast.     . doxazosin (CARDURA) 4 MG tablet TAKE ONE-HALF TABLET BY  MOUTH AT BEDTIME 45 tablet 3  . FLUoxetine (PROZAC) 20 MG capsule Take 1 capsule (20 mg total) by mouth daily. 90 capsule 1  . glipiZIDE (GLUCOTROL XL) 10 MG 24 hr tablet Take 1 tablet (10 mg total) by mouth daily. 90 tablet 1  . Glucagon (GVOKE HYPOPEN 2-PACK) 1 MG/0.2ML SOAJ Inject 1 Act into the skin daily as needed. 2 mL 5  . insulin glargine, 2 Unit Dial, (TOUJEO MAX SOLOSTAR) 300 UNIT/ML Solostar Pen Inject 10 Units into the skin daily. 6 mL 1  . Insulin Pen Needle 32G X 6 MM MISC 1 Act by Does not apply route daily. 100 each 1  . levocetirizine (XYZAL) 5 MG tablet Take 1 tablet (5 mg total) by mouth every evening. 90 tablet 1  . mupirocin ointment (BACTROBAN) 2 % Apply 1  application topically 2 (two) times daily. 22 g 0  . pantoprazole (PROTONIX) 40 MG tablet TAKE 1 TABLET BY MOUTH  DAILY 90 tablet 1  . PRESCRIPTION MEDICATION CPAP- At bedtime    . psyllium (METAMUCIL) 58.6 % powder Take 1 packet by mouth daily as needed (for constipation- MIX AND DRINK).     [name] solifenacin (VESICARE) 10 MG tablet TAKE 1 TABLET BY MOUTH  DAILY 90 tablet 1  . telmisartan (MICARDIS) 20 MG tablet Take 1 tablet (20 mg total) by mouth daily. 90 tablet 1  . triamcinolone cream (KENALOG) 0.1 % Apply 1 application topically daily as needed (to affected areas).      Current Facility-Administered Medications  Medication Dose Route Frequency Provider Last Rate Last Admin  . cyanocobalamin ((VITAMIN B-12)) injection 1,000 mcg  1,000 mcg Intramuscular Q30 days Janith Lima, MD   1,000 mcg at 07/17/20 1015     Past Medical History:  Diagnosis Date  . Allergy    Rhinitis  . Anemia    NOS iron deficient and B12 deficient  . Arthritis   . 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 Diabetes mellitus    Type 2  . GERD (gastroesophageal reflux disease)   . Hyperlipidemia   .  Hypertension   . 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 OSA (obstructive sleep apnea)    cpap  . TBI (traumatic brain injury) ([place])     ROS:   All systems reviewed and negative except as noted in the HPI.   Past Surgical History:  Procedure Laterality Date  . APPENDECTOMY    . arm fracture Left 2011   with hardware  . BURR HOLE Bilateral 08/04/2017   Procedure: Haskell Flirt;  Surgeon: [name], MD;  Location: [place];  Service: Neurosurgery;  Laterality: Bilateral;  . COLONOSCOPY    . CRANIOTOMY Left 12/01/2017   Procedure: Trudee Kuster Holes CRANIOTOMY HEMATOMA EVACUATION SUBDURAL;  Surgeon: [name], MD;  Location: [place];  Service: Neurosurgery;  Laterality: Left;  . ESOPHAGOGASTRODUODENOSCOPY  2006   gastritis  . PACEMAKER IMPLANT N/A 05/14/2020   Procedure: PACEMAKER IMPLANT;  Surgeon: [name], MD;   Location: [facility];  Service: Cardiovascular;  Laterality: N/A;  . ROTATOR CUFF REPAIR    . SEPTOPLASTY  1959   Deviated Septum  . 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 Heart disease Mother   . Breast cancer Mother   . Stroke Father   . Allergies Father        Father and children  . Coronary artery disease Brother   . Diabetes Neg Hx   . Colon cancer Neg Hx      Social History   Socioeconomic History  . Marital status: Married    Spouse name: Not on file  . Number of children: 1  . Years of education: Not on file  . Highest education level: Not on file  Occupational History  . Not on file  Tobacco Use  . Smoking status: Former Smoker    Packs/day: 1.00    Quit date: 07/14/1983    Years since quitting: 37.1  . Smokeless tobacco: Never Used  Vaping Use  . Vaping Use: Never used  Substance and Sexual Activity  . Alcohol use: Yes    Alcohol/week: 2.0 standard drinks    Types: 2 Standard drinks or equivalent per week    Comment: occassionally  . Drug use: No  . Sexual activity: Never  Other Topics Concern  . Not on file  Social History Narrative  . Not on file   Social Determinants of Health   Financial Resource Strain: Low Risk   . Difficulty of Paying Living Expenses: Not hard at all  Food Insecurity: Not on file  Transportation Needs: Not on file  Physical Activity: Not on file  Stress: Not on file  Social Connections: Not on file  Intimate Partner Violence: Not on file     BP (!) 102/42   Pulse 64   Ht 5\' 9"  (1.753 m)   Wt 211 lb 9.6 oz (96 kg)   SpO2 96%   BMI 31.25 kg/m   Physical Exam:  Well appearing NAD HEENT: Unremarkable Neck:  No JVD, no thyromegally Lymphatics:  No adenopathy Back:  No CVA tenderness Lungs:  Clear with no wheezes HEART:  Regular rate rhythm, no murmurs, no rubs, no clicks Abd:  soft, positive bowel sounds, no organomegally, no rebound, no guarding Ext:   2 plus pulses, no edema, no cyanosis, no clubbing Skin:  No rashes no nodules Neuro:  CN II through XII intact, motor grossly intact  EKG - nsr with left bundle area pacing  DEVICE  Normal device function.  See PaceArt for details.   Assess/Plan: 1. CHB - he is asymptomatic, s/p PPM insertion. He has no escape today. 2. 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 3. HTN - he has some bp lability. He is encouraged to check his pressures. On days when his pressure is a little high, eat less salt and on days when it is low, eat more salt. No change in his current meds. 4. Dyslipidemia - he will continue the lipitor. I encouraged him to lose weight.  Carleene Overlie Nkenge Sonntag,MD

## 2020-08-21 ENCOUNTER — Other Ambulatory Visit: Payer: Self-pay

## 2020-08-21 ENCOUNTER — Ambulatory Visit (INDEPENDENT_AMBULATORY_CARE_PROVIDER_SITE_OTHER): Payer: Medicare Other | Admitting: Internal Medicine

## 2020-08-21 ENCOUNTER — Encounter: Payer: Self-pay | Admitting: Internal Medicine

## 2020-08-21 VITALS — BP 144/60 | HR 68 | Temp 97.7°F | Ht 69.0 in | Wt 212.8 lb

## 2020-08-21 DIAGNOSIS — E559 Vitamin D deficiency, unspecified: Secondary | ICD-10-CM

## 2020-08-21 DIAGNOSIS — N1832 Chronic kidney disease, stage 3b: Secondary | ICD-10-CM

## 2020-08-21 DIAGNOSIS — E538 Deficiency of other specified B group vitamins: Secondary | ICD-10-CM

## 2020-08-21 DIAGNOSIS — D510 Vitamin B12 deficiency anemia due to intrinsic factor deficiency: Secondary | ICD-10-CM

## 2020-08-21 DIAGNOSIS — Z0000 Encounter for general adult medical examination without abnormal findings: Secondary | ICD-10-CM | POA: Diagnosis not present

## 2020-08-21 DIAGNOSIS — E1142 Type 2 diabetes mellitus with diabetic polyneuropathy: Secondary | ICD-10-CM

## 2020-08-21 DIAGNOSIS — E785 Hyperlipidemia, unspecified: Secondary | ICD-10-CM

## 2020-08-21 DIAGNOSIS — I10 Essential (primary) hypertension: Secondary | ICD-10-CM | POA: Diagnosis not present

## 2020-08-21 LAB — BASIC METABOLIC PANEL
BUN: 37 mg/dL — ABNORMAL HIGH (ref 6–23)
CO2: 29 mEq/L (ref 19–32)
Calcium: 9.3 mg/dL (ref 8.4–10.5)
Chloride: 106 mEq/L (ref 96–112)
Creatinine, Ser: 1.74 mg/dL — ABNORMAL HIGH (ref 0.40–1.50)
GFR: 36.21 mL/min — ABNORMAL LOW (ref >60.00)
Glucose, Bld: 97 mg/dL (ref 70–99)
Potassium: 4.6 mEq/L (ref 3.5–5.1)
Sodium: 139 mEq/L (ref 135–145)

## 2020-08-21 LAB — URINALYSIS, ROUTINE W REFLEX MICROSCOPIC
Bilirubin Urine: NEGATIVE
Ketones, ur: NEGATIVE
Leukocytes,Ua: NEGATIVE
Nitrite: NEGATIVE
RBC / HPF: NONE SEEN (ref 0–?)
Specific Gravity, Urine: >=1.03 — AB (ref 1.000–1.030)
Total Protein, Urine: 30 — AB
Urine Glucose: NEGATIVE
Urobilinogen, UA: 0.2 (ref 0.0–1.0)
pH: 5.5 (ref 5.0–8.0)

## 2020-08-21 LAB — HEPATIC FUNCTION PANEL
ALT: 18 U/L (ref 0–53)
AST: 15 U/L (ref 0–37)
Albumin: 3.9 g/dL (ref 3.5–5.2)
Alkaline Phosphatase: 73 U/L (ref 39–117)
Bilirubin, Direct: 0.1 mg/dL (ref 0.0–0.3)
Total Bilirubin: 0.4 mg/dL (ref 0.2–1.2)
Total Protein: 6.6 g/dL (ref 6.0–8.3)

## 2020-08-21 LAB — LIPID PANEL
Cholesterol: 105 mg/dL (ref 0–200)
HDL: 44.8 mg/dL (ref >39.00)
LDL Cholesterol: 47 mg/dL (ref 0–99)
NonHDL: 60.21
Total CHOL/HDL Ratio: 2
Triglycerides: 68 mg/dL (ref 0.0–149.0)
VLDL: 13.6 mg/dL (ref 0.0–40.0)

## 2020-08-21 LAB — CBC WITH DIFFERENTIAL/PLATELET
Basophils Absolute: 0 10*3/uL (ref 0.0–0.1)
Basophils Relative: 0.8 % (ref 0.0–3.0)
Eosinophils Absolute: 0.4 10*3/uL (ref 0.0–0.7)
Eosinophils Relative: 6.8 % — ABNORMAL HIGH (ref 0.0–5.0)
HCT: 36.4 % — ABNORMAL LOW (ref 39.0–52.0)
Hemoglobin: 11.8 g/dL — ABNORMAL LOW (ref 13.0–17.0)
Lymphocytes Relative: 25.9 % (ref 12.0–46.0)
Lymphs Abs: 1.5 10*3/uL (ref 0.7–4.0)
MCHC: 32.5 g/dL (ref 30.0–36.0)
MCV: 81.5 fl (ref 78.0–100.0)
Monocytes Absolute: 0.6 10*3/uL (ref 0.1–1.0)
Monocytes Relative: 10.6 % (ref 3.0–12.0)
Neutro Abs: 3.3 10*3/uL (ref 1.4–7.7)
Neutrophils Relative %: 55.9 % (ref 43.0–77.0)
Platelets: 142 10*3/uL — ABNORMAL LOW (ref 150.0–400.0)
RBC: 4.46 Mil/uL (ref 4.22–5.81)
RDW: 14.1 % (ref 11.5–15.5)
WBC: 5.8 10*3/uL (ref 4.0–10.5)

## 2020-08-21 LAB — HEMOGLOBIN A1C: Hgb A1c MFr Bld: 7.6 % — ABNORMAL HIGH (ref 4.6–6.5)

## 2020-08-21 LAB — FOLATE: Folate: 10 ng/mL (ref >5.9)

## 2020-08-21 LAB — VITAMIN D 25 HYDROXY (VIT D DEFICIENCY, FRACTURES): VITD: 34.8 ng/mL (ref 30.00–100.00)

## 2020-08-21 NOTE — Progress Notes (Signed)
Subjective:  Patient ID: Frank [name], male    DOB: May 06, 1941  Age: 80 y.o. MRN: [id number]  CC: Anemia, Annual Exam, Hypertension, and Hyperlipidemia  This visit occurred during the SARS-CoV-2 public health emergency.  Safety protocols were in place, including screening questions prior to the visit, additional usage of staff PPE, and extensive cleaning of exam room while observing appropriate contact time as indicated for disinfecting solutions.    HPI Frank [name] presents for a CPX.  He has had some fluctuations in his blood pressure but no related symptoms.  He denies dizziness, lightheadedness, palpitations, near syncope, chest pain, shortness of breath, or edema.  Outpatient Medications Prior to Visit  Medication Sig Dispense Refill  . acetaminophen (TYLENOL) 325 MG tablet Take 1-2 tablets (325-650 mg total) by mouth every 4 (four) hours as needed for mild pain.    [name] amLODipine (NORVASC) 10 MG tablet Take 10 mg by mouth at bedtime.    [name] atorvastatin (LIPITOR) 20 MG tablet TAKE ONE-HALF TABLET BY  MOUTH DAILY 45 tablet 3  . azelastine (ASTELIN) 0.1 % nasal spray Place 1 spray into both nostrils 2 (two) times daily. Use in each nostril as directed 90 mL 1  . Carbonyl Iron 25 MG TABS Take 25 mg by mouth daily with breakfast.     . doxazosin (CARDURA) 4 MG tablet TAKE ONE-HALF TABLET BY  MOUTH AT BEDTIME 45 tablet 3  . FLUoxetine (PROZAC) 20 MG capsule Take 1 capsule (20 mg total) by mouth daily. 90 capsule 1  . glipiZIDE (GLUCOTROL XL) 10 MG 24 hr tablet Take 1 tablet (10 mg total) by mouth daily. 90 tablet 1  . Glucagon (GVOKE HYPOPEN 2-PACK) 1 MG/0.2ML SOAJ Inject 1 Act into the skin daily as needed. 2 mL 5  . insulin glargine, 2 Unit Dial, (TOUJEO MAX SOLOSTAR) 300 UNIT/ML Solostar Pen Inject 10 Units into the skin daily. 6 mL 1  . Insulin Pen Needle 32G X 6 MM MISC 1 Act by Does not apply route daily. 100 each 1  . levocetirizine (XYZAL) 5 MG tablet Take 1 tablet  (5 mg total) by mouth every evening. 90 tablet 1  . mupirocin ointment (BACTROBAN) 2 % Apply 1 application topically 2 (two) times daily. 22 g 0  . pantoprazole (PROTONIX) 40 MG tablet TAKE 1 TABLET BY MOUTH  DAILY 90 tablet 1  . PRESCRIPTION MEDICATION CPAP- At bedtime    . psyllium (METAMUCIL) 58.6 % powder Take 1 packet by mouth daily as needed (for constipation- MIX AND DRINK).     [name] solifenacin (VESICARE) 10 MG tablet TAKE 1 TABLET BY MOUTH  DAILY 90 tablet 1  . telmisartan (MICARDIS) 20 MG tablet Take 1 tablet (20 mg total) by mouth daily. 90 tablet 1  . triamcinolone cream (KENALOG) 0.1 % Apply 1 application topically daily as needed (to affected areas).      Facility-Administered Medications Prior to Visit  Medication Dose Route Frequency Provider Last Rate Last Admin  . cyanocobalamin ((VITAMIN B-12)) injection 1,000 mcg  1,000 mcg Intramuscular Q30 days Janith Lima, MD   1,000 mcg at 08/21/20 0935    ROS Review of Systems  Constitutional: Negative.  Negative for appetite change, diaphoresis, fatigue and unexpected weight change.  HENT: Negative.   Eyes: Negative for visual disturbance.  Respiratory: Negative for cough, chest tightness, shortness of breath and wheezing.   Cardiovascular: Negative for chest pain, palpitations and leg swelling.  Gastrointestinal: Negative for abdominal pain, diarrhea, nausea  and vomiting.  Endocrine: Negative.   Genitourinary: Negative.  Negative for difficulty urinating.  Musculoskeletal: Negative for arthralgias and myalgias.  Skin: Negative.  Negative for color change and pallor.  Neurological: Negative.  Negative for dizziness, weakness, light-headedness, numbness and headaches.  Hematological: Negative for adenopathy. Does not bruise/bleed easily.  Psychiatric/Behavioral: Negative.     Objective:  BP (!) 144/60 (BP Location: Right Arm, Patient Position: Sitting, Cuff Size: Normal)   Pulse 68   Temp 97.7 F (36.5 C) (Oral)   Ht 5'  9" (1.753 m)   Wt 212 lb 12.8 oz (96.5 kg)   SpO2 96%   BMI 31.43 kg/m   BP Readings from Last 3 Encounters:  08/21/20 (!) 144/60  08/19/20 (!) 102/42  05/31/20 130/70    Wt Readings from Last 3 Encounters:  08/21/20 212 lb 12.8 oz (96.5 kg)  08/19/20 211 lb 9.6 oz (96 kg)  05/31/20 210 lb (95.3 kg)    Physical Exam Vitals reviewed.  HENT:     Nose: Nose normal.     Mouth/Throat:     Mouth: Mucous membranes are moist.  Eyes:     General: No scleral icterus.    Conjunctiva/sclera: Conjunctivae normal.  Cardiovascular:     Rate and Rhythm: Normal rate and regular rhythm.     Heart sounds: No murmur heard. No gallop.   Pulmonary:     Effort: Pulmonary effort is normal.     Breath sounds: No stridor. No wheezing, rhonchi or rales.  Abdominal:     General: Abdomen is flat. Bowel sounds are normal. There is no distension.     Palpations: Abdomen is soft. There is no hepatomegaly, splenomegaly or mass.     Tenderness: There is no abdominal tenderness.  Musculoskeletal:        General: Normal range of motion.     Cervical back: Neck supple.     Right lower leg: No edema.     Left lower leg: No edema.  Lymphadenopathy:     Cervical: No cervical adenopathy.  Skin:    General: Skin is warm and dry.     Coloration: Skin is pale.  Neurological:     General: No focal deficit present.     Mental Status: He is alert.  Psychiatric:        Mood and Affect: Mood normal.        Behavior: Behavior normal.     Lab Results  Component Value Date   WBC 5.8 08/21/2020   HGB 11.8 (L) 08/21/2020   HCT 36.4 (L) 08/21/2020   PLT 142.0 (L) 08/21/2020   GLUCOSE 97 08/21/2020   CHOL 105 08/21/2020   TRIG 68.0 08/21/2020   HDL 44.80 08/21/2020   LDLCALC 47 08/21/2020   ALT 18 08/21/2020   AST 15 08/21/2020   NA 139 08/21/2020   K 4.6 08/21/2020   CL 106 08/21/2020   CREATININE 1.74 (H) 08/21/2020   BUN 37 (H) 08/21/2020   CO2 29 08/21/2020   TSH 1.479 05/14/2020   PSA 1.08  07/10/2015   INR 0.98 08/04/2017   HGBA1C 7.6 (H) 08/21/2020   MICROALBUR 67.2 04/23/2020    DG Chest 2 View  Result Date: 05/15/2020 CLINICAL DATA:  80 year old male with history of pacemaker placement. EXAM: CHEST - 2 VIEW COMPARISON:  Chest x-ray 01/28/2020. FINDINGS: New left-sided pacemaker device with lead tips projecting over the expected location of the right atrium and right ventricle. Lung volumes are low. There are some bibasilar  linear opacities favored to reflect mild subsegmental atelectasis. No definite consolidative airspace disease. No pleural effusions. No pneumothorax. No evidence of pulmonary edema. Heart size is borderline enlarged. Upper mediastinal contours are within normal limits. Aortic atherosclerosis. Soft tissue anchor in the right humeral head, presumably from prior rotator cuff surgery. Postoperative changes of ORIF in the left proximal humerus incompletely imaged. IMPRESSION: 1. New left-sided pacemaker device appears appropriately located without evidence of pneumothorax or other immediate complicating features. 2. Probable dependent subsegmental atelectasis in the lower lobes of the lungs bilaterally. Electronically Signed   By: Vinnie Langton M.D.   On: 05/15/2020 08:19   EP PPM/ICD IMPLANT  Result Date: 05/14/2020 CONCLUSIONS:  1. Successful implantation of a medtronic dual-chamber pacemaker for symptomatic bradycardia due to mobitz 2, second degree AV block.  2. No early apparent complications.       Cristopher Peru, MD 05/14/2020 5:26 PM    Assessment & Plan:   [place] was seen today for anemia, annual exam, hypertension and hyperlipidemia.  Diagnoses and all orders for this visit:  Essential hypertension- His blood pressure is adequately well controlled. -     Basic metabolic panel; Future -     Urinalysis, Routine w reflex microscopic; Future -     Urinalysis, Routine w reflex microscopic -     Basic metabolic panel  Type 2 diabetes mellitus with  peripheral neuropathy ([place])- His blood sugar is well controlled. -     Basic metabolic panel; Future -     Hemoglobin A1c; Future -     HM Diabetes Foot Exam -     Hemoglobin A1c -     Basic metabolic panel  Stage 3b chronic kidney disease ([place])- His renal function is stable. -     Urinalysis, Routine w reflex microscopic; Future -     Urinalysis, Routine w reflex microscopic  Vitamin B12 deficiency anemia due to intrinsic factor deficiency- His H&H have improved.  His folate level is normal.  Will continue parenteral B12 replacement therapy. -     CBC with Differential/Platelet; Future -     Folate; Future -     Folate -     CBC with Differential/Platelet  Hyperlipidemia with target LDL less than 100- He has achieved his LDL goal and is doing well on the statin. -     Lipid panel; Future -     Hepatic function panel; Future -     Hepatic function panel -     Lipid panel  Routine general medical examination at a health care facility- Exam completed, labs reviewed, vaccines reviewed, no cancer screenings are indicated, patient education was given.  Vitamin D deficiency- His vitamin D level is normal now. -     VITAMIN D 25 Hydroxy (Vit-D Deficiency, Fractures); Future -     VITAMIN D 25 Hydroxy (Vit-D Deficiency, Fractures)   I am having [name] [name] "ken" maintain his Carbonyl Iron, triamcinolone, azelastine, levocetirizine, Insulin Pen Needle, FLUoxetine, glipiZIDE, Toujeo Max SoloStar, pantoprazole, Gvoke HypoPen 2-Pack, telmisartan, amLODipine, psyllium, PRESCRIPTION MEDICATION, acetaminophen, mupirocin ointment, doxazosin, atorvastatin, and solifenacin. We administered cyanocobalamin. We will continue to administer cyanocobalamin.  No orders of the defined types were placed in this encounter.    Follow-up: Return in about 3 months (around 11/18/2020).  Scarlette Calico, MD

## 2020-08-21 NOTE — Patient Instructions (Signed)

## 2020-08-23 LAB — CUP PACEART INCLINIC DEVICE CHECK
Battery Remaining Longevity: 138 mo
Battery Voltage: 3.2 V
Brady Statistic AP VP Percent: 5.04 %
Brady Statistic AP VS Percent: 0 %
Brady Statistic AS VP Percent: 94.86 %
Brady Statistic AS VS Percent: 0.1 %
Brady Statistic RA Percent Paced: 5.07 %
Brady Statistic RV Percent Paced: 99.9 %
Date Time Interrogation Session: 20220207145900
Implantable Lead Implant Date: 20211102
Implantable Lead Implant Date: 20211102
Implantable Lead Location: 753859
Implantable Lead Location: 753860
Implantable Lead Model: 3830
Implantable Lead Model: 5076
Implantable Pulse Generator Implant Date: 20211102
Lead Channel Impedance Value: 342 Ohm
Lead Channel Impedance Value: 456 Ohm
Lead Channel Impedance Value: 475 Ohm
Lead Channel Impedance Value: 684 Ohm
Lead Channel Pacing Threshold Amplitude: 0.75 V
Lead Channel Pacing Threshold Amplitude: 0.75 V
Lead Channel Pacing Threshold Pulse Width: 0.4 ms
Lead Channel Pacing Threshold Pulse Width: 0.4 ms
Lead Channel Sensing Intrinsic Amplitude: 15.5 mV
Lead Channel Sensing Intrinsic Amplitude: 15.5 mV
Lead Channel Sensing Intrinsic Amplitude: 3.5 mV
Lead Channel Sensing Intrinsic Amplitude: 4.5 mV
Lead Channel Setting Pacing Amplitude: 1.5 V
Lead Channel Setting Pacing Amplitude: 2.5 V
Lead Channel Setting Pacing Pulse Width: 0.4 ms
Lead Channel Setting Sensing Sensitivity: 1.2 mV

## 2020-09-02 ENCOUNTER — Ambulatory Visit: Payer: Medicare Other | Admitting: Internal Medicine

## 2020-09-07 ENCOUNTER — Other Ambulatory Visit: Payer: Self-pay | Admitting: Internal Medicine

## 2020-09-07 DIAGNOSIS — I10 Essential (primary) hypertension: Secondary | ICD-10-CM

## 2020-09-07 DIAGNOSIS — E1142 Type 2 diabetes mellitus with diabetic polyneuropathy: Secondary | ICD-10-CM

## 2020-09-09 ENCOUNTER — Other Ambulatory Visit: Payer: Self-pay

## 2020-09-09 ENCOUNTER — Emergency Department (HOSPITAL_COMMUNITY): Payer: Medicare Other | Admitting: Anesthesiology

## 2020-09-09 ENCOUNTER — Inpatient Hospital Stay (HOSPITAL_COMMUNITY)
Admission: EM | Admit: 2020-09-09 | Discharge: 2020-09-20 | DRG: 023 | Disposition: A | Discharge status: Rehabilitation Facility | Payer: Medicare Other | Attending: Student in an Organized Health Care Education/Training Program | Admitting: Student in an Organized Health Care Education/Training Program

## 2020-09-09 ENCOUNTER — Ambulatory Visit: Payer: Medicare Other | Admitting: Internal Medicine

## 2020-09-09 ENCOUNTER — Emergency Department (HOSPITAL_COMMUNITY): Payer: Medicare Other

## 2020-09-09 ENCOUNTER — Encounter (HOSPITAL_COMMUNITY): Payer: Self-pay

## 2020-09-09 ENCOUNTER — Encounter (HOSPITAL_COMMUNITY)
Admission: EM | Disposition: A | Discharge status: Rehabilitation Facility | Payer: Self-pay | Source: Home / Self Care | Attending: Neurology

## 2020-09-09 ENCOUNTER — Telehealth: Payer: Self-pay | Admitting: Internal Medicine

## 2020-09-09 ENCOUNTER — Inpatient Hospital Stay (HOSPITAL_COMMUNITY): Payer: Medicare Other

## 2020-09-09 ENCOUNTER — Encounter (HOSPITAL_COMMUNITY): Payer: Self-pay | Admitting: Neurology

## 2020-09-09 DIAGNOSIS — I6389 Other cerebral infarction: Secondary | ICD-10-CM | POA: Diagnosis not present

## 2020-09-09 DIAGNOSIS — Z9989 Dependence on other enabling machines and devices: Secondary | ICD-10-CM | POA: Diagnosis not present

## 2020-09-09 DIAGNOSIS — R1312 Dysphagia, oropharyngeal phase: Secondary | ICD-10-CM | POA: Diagnosis not present

## 2020-09-09 DIAGNOSIS — S065X9A Traumatic subdural hemorrhage with loss of consciousness of unspecified duration, initial encounter: Secondary | ICD-10-CM | POA: Diagnosis not present

## 2020-09-09 DIAGNOSIS — Z6832 Body mass index (BMI) 32.0-32.9, adult: Secondary | ICD-10-CM

## 2020-09-09 DIAGNOSIS — N1832 Chronic kidney disease, stage 3b: Secondary | ICD-10-CM | POA: Diagnosis present

## 2020-09-09 DIAGNOSIS — E785 Hyperlipidemia, unspecified: Secondary | ICD-10-CM | POA: Diagnosis not present

## 2020-09-09 DIAGNOSIS — J9601 Acute respiratory failure with hypoxia: Secondary | ICD-10-CM

## 2020-09-09 DIAGNOSIS — E559 Vitamin D deficiency, unspecified: Secondary | ICD-10-CM | POA: Diagnosis not present

## 2020-09-09 DIAGNOSIS — Z4682 Encounter for fitting and adjustment of non-vascular catheter: Secondary | ICD-10-CM | POA: Diagnosis not present

## 2020-09-09 DIAGNOSIS — J690 Pneumonitis due to inhalation of food and vomit: Secondary | ICD-10-CM | POA: Diagnosis not present

## 2020-09-09 DIAGNOSIS — R410 Disorientation, unspecified: Secondary | ICD-10-CM | POA: Diagnosis not present

## 2020-09-09 DIAGNOSIS — Z882 Allergy status to sulfonamides status: Secondary | ICD-10-CM

## 2020-09-09 DIAGNOSIS — Z743 Need for continuous supervision: Secondary | ICD-10-CM | POA: Diagnosis not present

## 2020-09-09 DIAGNOSIS — Z7902 Long term (current) use of antithrombotics/antiplatelets: Secondary | ICD-10-CM

## 2020-09-09 DIAGNOSIS — Z794 Long term (current) use of insulin: Secondary | ICD-10-CM

## 2020-09-09 DIAGNOSIS — D509 Iron deficiency anemia, unspecified: Secondary | ICD-10-CM | POA: Diagnosis present

## 2020-09-09 DIAGNOSIS — I63311 Cerebral infarction due to thrombosis of right middle cerebral artery: Principal | ICD-10-CM | POA: Diagnosis present

## 2020-09-09 DIAGNOSIS — I6601 Occlusion and stenosis of right middle cerebral artery: Secondary | ICD-10-CM | POA: Diagnosis not present

## 2020-09-09 DIAGNOSIS — G4733 Obstructive sleep apnea (adult) (pediatric): Secondary | ICD-10-CM | POA: Diagnosis present

## 2020-09-09 DIAGNOSIS — I10 Essential (primary) hypertension: Secondary | ICD-10-CM | POA: Diagnosis not present

## 2020-09-09 DIAGNOSIS — N400 Enlarged prostate without lower urinary tract symptoms: Secondary | ICD-10-CM | POA: Diagnosis present

## 2020-09-09 DIAGNOSIS — G8194 Hemiplegia, unspecified affecting left nondominant side: Secondary | ICD-10-CM | POA: Diagnosis not present

## 2020-09-09 DIAGNOSIS — I63411 Cerebral infarction due to embolism of right middle cerebral artery: Secondary | ICD-10-CM | POA: Diagnosis not present

## 2020-09-09 DIAGNOSIS — Z4659 Encounter for fitting and adjustment of other gastrointestinal appliance and device: Secondary | ICD-10-CM

## 2020-09-09 DIAGNOSIS — R471 Dysarthria and anarthria: Secondary | ICD-10-CM | POA: Diagnosis present

## 2020-09-09 DIAGNOSIS — R29704 NIHSS score 4: Secondary | ICD-10-CM | POA: Diagnosis not present

## 2020-09-09 DIAGNOSIS — I639 Cerebral infarction, unspecified: Secondary | ICD-10-CM

## 2020-09-09 DIAGNOSIS — E538 Deficiency of other specified B group vitamins: Secondary | ICD-10-CM | POA: Diagnosis present

## 2020-09-09 DIAGNOSIS — I129 Hypertensive chronic kidney disease with stage 1 through stage 4 chronic kidney disease, or unspecified chronic kidney disease: Secondary | ICD-10-CM | POA: Diagnosis not present

## 2020-09-09 DIAGNOSIS — R279 Unspecified lack of coordination: Secondary | ICD-10-CM | POA: Diagnosis not present

## 2020-09-09 DIAGNOSIS — R0602 Shortness of breath: Secondary | ICD-10-CM

## 2020-09-09 DIAGNOSIS — R339 Retention of urine, unspecified: Secondary | ICD-10-CM | POA: Diagnosis not present

## 2020-09-09 DIAGNOSIS — I6501 Occlusion and stenosis of right vertebral artery: Secondary | ICD-10-CM | POA: Diagnosis not present

## 2020-09-09 DIAGNOSIS — J9811 Atelectasis: Secondary | ICD-10-CM | POA: Diagnosis not present

## 2020-09-09 DIAGNOSIS — R414 Neurologic neglect syndrome: Secondary | ICD-10-CM | POA: Diagnosis not present

## 2020-09-09 DIAGNOSIS — R29810 Facial weakness: Secondary | ICD-10-CM | POA: Diagnosis present

## 2020-09-09 DIAGNOSIS — E1165 Type 2 diabetes mellitus with hyperglycemia: Secondary | ICD-10-CM | POA: Diagnosis not present

## 2020-09-09 DIAGNOSIS — J9602 Acute respiratory failure with hypercapnia: Secondary | ICD-10-CM | POA: Diagnosis not present

## 2020-09-09 DIAGNOSIS — I6522 Occlusion and stenosis of left carotid artery: Secondary | ICD-10-CM | POA: Diagnosis not present

## 2020-09-09 DIAGNOSIS — I441 Atrioventricular block, second degree: Secondary | ICD-10-CM | POA: Diagnosis present

## 2020-09-09 DIAGNOSIS — Z9889 Other specified postprocedural states: Secondary | ICD-10-CM | POA: Diagnosis not present

## 2020-09-09 DIAGNOSIS — J309 Allergic rhinitis, unspecified: Secondary | ICD-10-CM | POA: Diagnosis present

## 2020-09-09 DIAGNOSIS — R6889 Other general symptoms and signs: Secondary | ICD-10-CM | POA: Diagnosis not present

## 2020-09-09 DIAGNOSIS — I6521 Occlusion and stenosis of right carotid artery: Secondary | ICD-10-CM | POA: Diagnosis present

## 2020-09-09 DIAGNOSIS — R531 Weakness: Secondary | ICD-10-CM | POA: Diagnosis not present

## 2020-09-09 DIAGNOSIS — Z87820 Personal history of traumatic brain injury: Secondary | ICD-10-CM

## 2020-09-09 DIAGNOSIS — R1310 Dysphagia, unspecified: Secondary | ICD-10-CM | POA: Diagnosis not present

## 2020-09-09 DIAGNOSIS — Z7984 Long term (current) use of oral hypoglycemic drugs: Secondary | ICD-10-CM

## 2020-09-09 DIAGNOSIS — E669 Obesity, unspecified: Secondary | ICD-10-CM | POA: Diagnosis present

## 2020-09-09 DIAGNOSIS — I615 Nontraumatic intracerebral hemorrhage, intraventricular: Secondary | ICD-10-CM | POA: Diagnosis present

## 2020-09-09 DIAGNOSIS — J9690 Respiratory failure, unspecified, unspecified whether with hypoxia or hypercapnia: Secondary | ICD-10-CM | POA: Diagnosis not present

## 2020-09-09 DIAGNOSIS — K5900 Constipation, unspecified: Secondary | ICD-10-CM | POA: Diagnosis not present

## 2020-09-09 DIAGNOSIS — Z888 Allergy status to other drugs, medicaments and biological substances status: Secondary | ICD-10-CM

## 2020-09-09 DIAGNOSIS — Z823 Family history of stroke: Secondary | ICD-10-CM

## 2020-09-09 DIAGNOSIS — Z20822 Contact with and (suspected) exposure to covid-19: Secondary | ICD-10-CM | POA: Diagnosis not present

## 2020-09-09 DIAGNOSIS — I2510 Atherosclerotic heart disease of native coronary artery without angina pectoris: Secondary | ICD-10-CM | POA: Diagnosis present

## 2020-09-09 DIAGNOSIS — Z950 Presence of cardiac pacemaker: Secondary | ICD-10-CM

## 2020-09-09 DIAGNOSIS — G936 Cerebral edema: Secondary | ICD-10-CM | POA: Diagnosis not present

## 2020-09-09 DIAGNOSIS — Z7409 Other reduced mobility: Secondary | ICD-10-CM | POA: Diagnosis not present

## 2020-09-09 DIAGNOSIS — E1122 Type 2 diabetes mellitus with diabetic chronic kidney disease: Secondary | ICD-10-CM | POA: Diagnosis not present

## 2020-09-09 DIAGNOSIS — R569 Unspecified convulsions: Secondary | ICD-10-CM | POA: Diagnosis not present

## 2020-09-09 DIAGNOSIS — I619 Nontraumatic intracerebral hemorrhage, unspecified: Secondary | ICD-10-CM | POA: Diagnosis not present

## 2020-09-09 DIAGNOSIS — M1990 Unspecified osteoarthritis, unspecified site: Secondary | ICD-10-CM | POA: Diagnosis present

## 2020-09-09 DIAGNOSIS — R262 Difficulty in walking, not elsewhere classified: Secondary | ICD-10-CM | POA: Diagnosis not present

## 2020-09-09 DIAGNOSIS — J90 Pleural effusion, not elsewhere classified: Secondary | ICD-10-CM | POA: Diagnosis not present

## 2020-09-09 DIAGNOSIS — I63511 Cerebral infarction due to unspecified occlusion or stenosis of right middle cerebral artery: Secondary | ICD-10-CM | POA: Diagnosis not present

## 2020-09-09 DIAGNOSIS — Z79899 Other long term (current) drug therapy: Secondary | ICD-10-CM

## 2020-09-09 DIAGNOSIS — I609 Nontraumatic subarachnoid hemorrhage, unspecified: Secondary | ICD-10-CM | POA: Diagnosis not present

## 2020-09-09 DIAGNOSIS — Z8249 Family history of ischemic heart disease and other diseases of the circulatory system: Secondary | ICD-10-CM

## 2020-09-09 DIAGNOSIS — I6200 Nontraumatic subdural hemorrhage, unspecified: Secondary | ICD-10-CM | POA: Diagnosis not present

## 2020-09-09 DIAGNOSIS — J383 Other diseases of vocal cords: Secondary | ICD-10-CM | POA: Diagnosis present

## 2020-09-09 DIAGNOSIS — Z880 Allergy status to penicillin: Secondary | ICD-10-CM

## 2020-09-09 DIAGNOSIS — I63231 Cerebral infarction due to unspecified occlusion or stenosis of right carotid arteries: Secondary | ICD-10-CM | POA: Diagnosis not present

## 2020-09-09 DIAGNOSIS — Z803 Family history of malignant neoplasm of breast: Secondary | ICD-10-CM

## 2020-09-09 DIAGNOSIS — K219 Gastro-esophageal reflux disease without esophagitis: Secondary | ICD-10-CM | POA: Diagnosis present

## 2020-09-09 DIAGNOSIS — I63512 Cerebral infarction due to unspecified occlusion or stenosis of left middle cerebral artery: Secondary | ICD-10-CM | POA: Diagnosis not present

## 2020-09-09 DIAGNOSIS — Z87891 Personal history of nicotine dependence: Secondary | ICD-10-CM

## 2020-09-09 DIAGNOSIS — E1140 Type 2 diabetes mellitus with diabetic neuropathy, unspecified: Secondary | ICD-10-CM | POA: Diagnosis present

## 2020-09-09 HISTORY — PX: RADIOLOGY WITH ANESTHESIA: SHX6223

## 2020-09-09 HISTORY — PX: IR PERCUTANEOUS ART THROMBECTOMY/INFUSION INTRACRANIAL INC DIAG ANGIO: IMG6087

## 2020-09-09 LAB — I-STAT CHEM 8, ED
BUN: 47 mg/dL — ABNORMAL HIGH (ref 8–23)
Calcium, Ion: 1.12 mmol/L — ABNORMAL LOW (ref 1.15–1.40)
Chloride: 100 mmol/L (ref 98–111)
Creatinine, Ser: 1.8 mg/dL — ABNORMAL HIGH (ref 0.61–1.24)
Glucose, Bld: 158 mg/dL — ABNORMAL HIGH (ref 70–99)
HCT: 36 % — ABNORMAL LOW (ref 39.0–52.0)
Hemoglobin: 12.2 g/dL — ABNORMAL LOW (ref 13.0–17.0)
Potassium: 4.9 mmol/L (ref 3.5–5.1)
Sodium: 136 mmol/L (ref 135–145)
TCO2: 27 mmol/L (ref 22–32)

## 2020-09-09 LAB — ECHOCARDIOGRAM COMPLETE
AR max vel: 1.69 cm2
AV Area VTI: 1.81 cm2
AV Area mean vel: 1.83 cm2
AV Mean grad: 9 mmHg
AV Peak grad: 15.7 mmHg
Ao pk vel: 1.98 m/s
Area-P 1/2: 1.89 cm2
Height: 69 in
MV VTI: 1.89 cm2
S' Lateral: 3.3 cm
Weight: 3492.09 oz

## 2020-09-09 LAB — COMPREHENSIVE METABOLIC PANEL
ALT: 22 U/L (ref 0–44)
AST: 22 U/L (ref 15–41)
Albumin: 3.6 g/dL (ref 3.5–5.0)
Alkaline Phosphatase: 69 U/L (ref 38–126)
Anion gap: 9 (ref 5–15)
BUN: 35 mg/dL — ABNORMAL HIGH (ref 8–23)
CO2: 24 mmol/L (ref 22–32)
Calcium: 8.8 mg/dL — ABNORMAL LOW (ref 8.9–10.3)
Chloride: 100 mmol/L (ref 98–111)
Creatinine, Ser: 1.9 mg/dL — ABNORMAL HIGH (ref 0.61–1.24)
GFR, Estimated: 35 mL/min — ABNORMAL LOW (ref >60)
Glucose, Bld: 163 mg/dL — ABNORMAL HIGH (ref 70–99)
Potassium: 4.7 mmol/L (ref 3.5–5.1)
Sodium: 133 mmol/L — ABNORMAL LOW (ref 135–145)
Total Bilirubin: 0.7 mg/dL (ref 0.3–1.2)
Total Protein: 6.4 g/dL — ABNORMAL LOW (ref 6.5–8.1)

## 2020-09-09 LAB — CBC
HCT: 34.8 % — ABNORMAL LOW (ref 39.0–52.0)
Hemoglobin: 11.2 g/dL — ABNORMAL LOW (ref 13.0–17.0)
MCH: 27.1 pg (ref 26.0–34.0)
MCHC: 32.2 g/dL (ref 30.0–36.0)
MCV: 84.1 fL (ref 80.0–100.0)
Platelets: 124 10*3/uL — ABNORMAL LOW (ref 150–400)
RBC: 4.14 MIL/uL — ABNORMAL LOW (ref 4.22–5.81)
RDW: 13.2 % (ref 11.5–15.5)
WBC: 6.2 10*3/uL (ref 4.0–10.5)
nRBC: 0 % (ref 0.0–0.2)

## 2020-09-09 LAB — MRSA PCR SCREENING: MRSA by PCR: NEGATIVE

## 2020-09-09 LAB — DIFFERENTIAL
Abs Immature Granulocytes: 0.02 10*3/uL (ref 0.00–0.07)
Basophils Absolute: 0.1 10*3/uL (ref 0.0–0.1)
Basophils Relative: 1 %
Eosinophils Absolute: 0.3 10*3/uL (ref 0.0–0.5)
Eosinophils Relative: 5 %
Immature Granulocytes: 0 %
Lymphocytes Relative: 21 %
Lymphs Abs: 1.3 10*3/uL (ref 0.7–4.0)
Monocytes Absolute: 0.6 10*3/uL (ref 0.1–1.0)
Monocytes Relative: 10 %
Neutro Abs: 3.9 10*3/uL (ref 1.7–7.7)
Neutrophils Relative %: 63 %

## 2020-09-09 LAB — CBG MONITORING, ED: Glucose-Capillary: 153 mg/dL — ABNORMAL HIGH (ref 70–99)

## 2020-09-09 LAB — PROTIME-INR
INR: 1 (ref 0.8–1.2)
Prothrombin Time: 12.7 seconds (ref 11.4–15.2)

## 2020-09-09 LAB — RESP PANEL BY RT-PCR (FLU A&B, COVID) ARPGX2
Influenza A by PCR: NEGATIVE
Influenza B by PCR: NEGATIVE
SARS Coronavirus 2 by RT PCR: NEGATIVE

## 2020-09-09 LAB — APTT: aPTT: 32 seconds (ref 24–36)

## 2020-09-09 SURGERY — IR WITH ANESTHESIA
Anesthesia: General

## 2020-09-09 MED ORDER — ACETAMINOPHEN 650 MG RE SUPP: 650.0000 mg | RECTAL | Status: DC | PRN

## 2020-09-09 MED ORDER — TICAGRELOR 60 MG PO TABS
ORAL_TABLET | ORAL | Status: AC | PRN
  Administered 2020-09-09: 180 mg

## 2020-09-09 MED ORDER — ASPIRIN 81 MG PO CHEW
81.0000 mg | CHEWABLE_TABLET | Freq: Every day | ORAL | Status: DC
  Administered 2020-09-11 – 2020-09-16 (×6): 81 mg
  Filled 2020-09-09 (×7): qty 1

## 2020-09-09 MED ORDER — INSULIN ASPART 100 UNIT/ML ~~LOC~~ SOLN
0.0000 [IU] | Freq: Three times a day (TID) | SUBCUTANEOUS | Status: DC
  Administered 2020-09-10 (×3): 3 [IU] via SUBCUTANEOUS
  Administered 2020-09-11: 5 [IU] via SUBCUTANEOUS

## 2020-09-09 MED ORDER — CLEVIDIPINE BUTYRATE 0.5 MG/ML IV EMUL
0.0000 mg/h | INTRAVENOUS | Status: AC
  Administered 2020-09-09: 16 mg/h via INTRAVENOUS
  Administered 2020-09-09: 21 mg/h via INTRAVENOUS
  Administered 2020-09-09: 18 mg/h via INTRAVENOUS
  Administered 2020-09-09: 15 mg/h via INTRAVENOUS
  Administered 2020-09-10 (×2): 18 mg/h via INTRAVENOUS
  Administered 2020-09-10: 21 mg/h via INTRAVENOUS
  Filled 2020-09-09 (×10): qty 50

## 2020-09-09 MED ORDER — ACETAMINOPHEN 325 MG PO TABS: 650.0000 mg | ORAL_TABLET | ORAL | Status: DC | PRN

## 2020-09-09 MED ORDER — TICAGRELOR 90 MG PO TABS
ORAL_TABLET | ORAL | Status: AC
  Filled 2020-09-09: qty 2

## 2020-09-09 MED ORDER — PERFLUTREN LIPID MICROSPHERE
1.0000 mL | INTRAVENOUS | Status: AC | PRN
  Administered 2020-09-09: 4 mL via INTRAVENOUS
  Filled 2020-09-09: qty 10

## 2020-09-09 MED ORDER — FENTANYL CITRATE (PF) 250 MCG/5ML IJ SOLN
INTRAMUSCULAR | Status: DC | PRN
  Administered 2020-09-09: 50 ug via INTRAVENOUS

## 2020-09-09 MED ORDER — ASPIRIN 325 MG PO TABS
ORAL_TABLET | ORAL | Status: AC
  Filled 2020-09-09: qty 1

## 2020-09-09 MED ORDER — IOHEXOL 300 MG/ML  SOLN: 100.0000 mL | Freq: Once | INTRAMUSCULAR | Status: DC | PRN

## 2020-09-09 MED ORDER — SUCCINYLCHOLINE CHLORIDE 20 MG/ML IJ SOLN
INTRAMUSCULAR | Status: DC | PRN
  Administered 2020-09-09: 100 mg via INTRAVENOUS

## 2020-09-09 MED ORDER — ACETAMINOPHEN 160 MG/5ML PO SOLN: 650.0000 mg | ORAL | Status: DC | PRN

## 2020-09-09 MED ORDER — SUGAMMADEX SODIUM 200 MG/2ML IV SOLN
INTRAVENOUS | Status: DC | PRN
  Administered 2020-09-09: 200 mg via INTRAVENOUS

## 2020-09-09 MED ORDER — VANCOMYCIN HCL 1000 MG IV SOLR
INTRAVENOUS | Status: DC | PRN
  Administered 2020-09-09: 1000 mg via INTRAVENOUS

## 2020-09-09 MED ORDER — ASPIRIN 300 MG RE SUPP: 300.0000 mg | Freq: Every day | RECTAL | Status: DC

## 2020-09-09 MED ORDER — SODIUM CHLORIDE (PF) 0.9 % IJ SOLN
INTRAVENOUS | Status: AC | PRN
  Administered 2020-09-09: 25 ug via INTRA_ARTERIAL

## 2020-09-09 MED ORDER — CLEVIDIPINE BUTYRATE 0.5 MG/ML IV EMUL
INTRAVENOUS | Status: DC | PRN
  Administered 2020-09-09: 3 mg/h via INTRAVENOUS

## 2020-09-09 MED ORDER — ONDANSETRON HCL 4 MG/2ML IJ SOLN
INTRAMUSCULAR | Status: DC | PRN
  Administered 2020-09-09: 4 mg via INTRAVENOUS

## 2020-09-09 MED ORDER — TICAGRELOR 90 MG PO TABS: 90.0000 mg | ORAL_TABLET | Freq: Two times a day (BID) | ORAL | Status: DC

## 2020-09-09 MED ORDER — CEFAZOLIN SODIUM-DEXTROSE 2-4 GM/100ML-% IV SOLN
INTRAVENOUS | Status: AC
  Filled 2020-09-09: qty 100

## 2020-09-09 MED ORDER — EPHEDRINE SULFATE-NACL 50-0.9 MG/10ML-% IV SOSY
PREFILLED_SYRINGE | INTRAVENOUS | Status: DC | PRN
  Administered 2020-09-09: 5 mg via INTRAVENOUS

## 2020-09-09 MED ORDER — STROKE: EARLY STAGES OF RECOVERY BOOK
Freq: Once | Status: AC
  Filled 2020-09-09: qty 1

## 2020-09-09 MED ORDER — CLEVIDIPINE BUTYRATE 0.5 MG/ML IV EMUL: 0.0000 mg/h | INTRAVENOUS | Status: DC

## 2020-09-09 MED ORDER — PROPOFOL 10 MG/ML IV BOLUS
INTRAVENOUS | Status: DC | PRN
  Administered 2020-09-09: 130 mg via INTRAVENOUS

## 2020-09-09 MED ORDER — SODIUM CHLORIDE 0.9 % IV SOLN: INTRAVENOUS | Status: DC | PRN

## 2020-09-09 MED ORDER — ROCURONIUM BROMIDE 10 MG/ML (PF) SYRINGE
PREFILLED_SYRINGE | INTRAVENOUS | Status: DC | PRN
  Administered 2020-09-09 (×2): 20 mg via INTRAVENOUS
  Administered 2020-09-09: 50 mg via INTRAVENOUS

## 2020-09-09 MED ORDER — PHENYLEPHRINE 40 MCG/ML (10ML) SYRINGE FOR IV PUSH (FOR BLOOD PRESSURE SUPPORT)
PREFILLED_SYRINGE | INTRAVENOUS | Status: DC | PRN
  Administered 2020-09-09: 40 ug via INTRAVENOUS
  Administered 2020-09-09: 80 ug via INTRAVENOUS

## 2020-09-09 MED ORDER — CHLORHEXIDINE GLUCONATE CLOTH 2 % EX PADS
6.0000 | MEDICATED_PAD | Freq: Every day | CUTANEOUS | Status: DC
  Administered 2020-09-09 – 2020-09-17 (×9): 6 via TOPICAL

## 2020-09-09 MED ORDER — SODIUM CHLORIDE 0.9 % IV SOLN: INTRAVENOUS | Status: DC

## 2020-09-09 MED ORDER — ASPIRIN 81 MG PO CHEW
CHEWABLE_TABLET | ORAL | Status: AC
  Filled 2020-09-09: qty 1

## 2020-09-09 MED ORDER — LIDOCAINE 2% (20 MG/ML) 5 ML SYRINGE
INTRAMUSCULAR | Status: DC | PRN
  Administered 2020-09-09: 60 mg via INTRAVENOUS

## 2020-09-09 MED ORDER — VERAPAMIL HCL 2.5 MG/ML IV SOLN
INTRAVENOUS | Status: AC
  Filled 2020-09-09: qty 2

## 2020-09-09 MED ORDER — ASPIRIN 325 MG PO TABS: 325.0000 mg | ORAL_TABLET | Freq: Every day | ORAL | Status: DC

## 2020-09-09 MED ORDER — ASPIRIN 325 MG PO TABS
ORAL_TABLET | ORAL | Status: AC | PRN
  Administered 2020-09-09: 81 mg

## 2020-09-09 MED ORDER — ATORVASTATIN CALCIUM 10 MG PO TABS
10.0000 mg | ORAL_TABLET | Freq: Every day | ORAL | Status: DC
  Filled 2020-09-09 (×2): qty 1

## 2020-09-09 MED ORDER — CLEVIDIPINE BUTYRATE 0.5 MG/ML IV EMUL
INTRAVENOUS | Status: AC
  Administered 2020-09-10: 21 mg/h via INTRAVENOUS
  Filled 2020-09-09: qty 50

## 2020-09-09 MED ORDER — DEXAMETHASONE SODIUM PHOSPHATE 10 MG/ML IJ SOLN
INTRAMUSCULAR | Status: DC | PRN
  Administered 2020-09-09: 5 mg via INTRAVENOUS

## 2020-09-09 MED ORDER — DOXAZOSIN MESYLATE 2 MG PO TABS
2.0000 mg | ORAL_TABLET | Freq: Every day | ORAL | Status: DC
  Filled 2020-09-09 (×2): qty 1

## 2020-09-09 MED ORDER — FLUOXETINE HCL 20 MG PO CAPS
20.0000 mg | ORAL_CAPSULE | Freq: Every day | ORAL | Status: DC
  Filled 2020-09-09 (×2): qty 1

## 2020-09-09 MED ORDER — ASPIRIN 81 MG PO CHEW
81.0000 mg | CHEWABLE_TABLET | Freq: Every day | ORAL | Status: DC
  Administered 2020-09-17 – 2020-09-20 (×4): 81 mg via ORAL
  Filled 2020-09-09 (×7): qty 1

## 2020-09-09 MED ORDER — IOHEXOL 350 MG/ML SOLN
100.0000 mL | Freq: Once | INTRAVENOUS | Status: AC | PRN
  Administered 2020-09-09: 100 mL via INTRAVENOUS

## 2020-09-09 MED ORDER — PHENYLEPHRINE HCL-NACL 10-0.9 MG/250ML-% IV SOLN
INTRAVENOUS | Status: DC | PRN
  Administered 2020-09-09: 25 ug/min via INTRAVENOUS

## 2020-09-09 MED ORDER — VANCOMYCIN HCL IN DEXTROSE 1-5 GM/200ML-% IV SOLN
INTRAVENOUS | Status: AC
  Filled 2020-09-09: qty 200

## 2020-09-09 MED ORDER — IOHEXOL 300 MG/ML  SOLN: 50.0000 mL | Freq: Once | INTRAMUSCULAR | Status: DC | PRN

## 2020-09-09 MED ORDER — NITROGLYCERIN 1 MG/10 ML FOR IR/CATH LAB
INTRA_ARTERIAL | Status: AC
  Filled 2020-09-09: qty 10

## 2020-09-09 MED ORDER — LORATADINE 10 MG PO TABS: 10.0000 mg | ORAL_TABLET | Freq: Every evening | ORAL | Status: DC

## 2020-09-09 MED ORDER — CANGRELOR TETRASODIUM 50 MG IV SOLR
INTRAVENOUS | Status: AC
  Filled 2020-09-09: qty 50

## 2020-09-09 MED ORDER — SODIUM CHLORIDE 0.9% FLUSH: 3.0000 mL | Freq: Once | INTRAVENOUS | Status: DC

## 2020-09-09 NOTE — ED Notes (Signed)
Transported to IR; accompanied by Stroke Nurse Jarrett Soho

## 2020-09-09 NOTE — Anesthesia Procedure Notes (Signed)
Procedure Name: Intubation Date/Time: 09/09/2020 11:59 AM Performed by: Gaylene Brooks, CRNA Pre-anesthesia Checklist: Patient identified, Emergency Drugs available, Suction available and Patient being monitored Patient Re-evaluated:Patient Re-evaluated prior to induction Oxygen Delivery Method: Circle System Utilized Preoxygenation: Pre-oxygenation with 100% oxygen Induction Type: IV induction Laryngoscope Size: Glidescope and 3 Grade View: Grade I Tube type: Oral Tube size: 8.0 mm Number of attempts: 1 Airway Equipment and Method: Stylet and Oral airway Placement Confirmation: ETT inserted through vocal cords under direct vision,  positive ETCO2 and breath sounds checked- equal and bilateral Secured at: 23 cm Tube secured with: Tape Dental Injury: Teeth and Oropharynx as per pre-operative assessment  Comments: Pt admit for code stroke. Covid status unknown. History of difficult intubation. Easy AOI with glide. Grade 1.

## 2020-09-09 NOTE — ED Triage Notes (Signed)
Patient arrived via EMS; from an OP rehab; reported new onset of right sided weakness along w/ right facial droop. [place] 0915 Reported hx of ICH.

## 2020-09-09 NOTE — Progress Notes (Addendum)
Dr. Theda Sers of Neurology and Dr. Estanislado Pandy paged. Dr. Theda Sers wished me to speak to Dr. Estanislado Pandy about this issue.  Orders for patient to keep right leg straight until 0700 09/10/2020 and order for reverse trendelenburg. This prevents a Yale swallow screen from being performed, thus patient must remain NPO. Brilinta ordered for 2200 via oral or per tube route. Patient has no gastric tube. For patient's comfort, this RN asked if patient may be sat up for swallow evaluation, as it has been over 6 hours since IR procedure. Dr. Estanislado Pandy refused. Patient will remain in reverse trendelenburg with right leg straight unitl 0700. This RN will attempt to place an NG tube. Dr Estanislado Pandy also made aware that, via arterial line, patient's systolic blood pressure has been within goal of 120-140, but patient's MAP has been below 65. Dr. Estanislado Pandy okay with MAP.  Will continue to monitor.

## 2020-09-09 NOTE — Sedation Documentation (Signed)
Bedside groin/pulse check with Merrilee Seashore, RN. No changes, see flowsheet.

## 2020-09-09 NOTE — Telephone Encounter (Signed)
Noted  

## 2020-09-09 NOTE — H&P (Addendum)
Neurology Admission H&P    Chief Complaint: code stroke  HPI: Mr. Cutsforth is an 80yo male with a PMHx of HLD, IDDM II, HTN, Mobitz II heart block s/p PPM, OSA, depression, BPH, B12 deficiency, CKD III, and TBI 3 yrs ago from a fall resulting in surgically managed subacute SDH 2019. Patient was brought to [facility] ED as a code stroke via EMS. LKW was 0915 at rehab when he began to have difficulty standing and walking and was noted to have a right facial droop. Also, c/o some dizziness, but no HA or vision changes. Patient states he goes to rehab for balance issues. He denied any weakness in his extremities or numbness/tingling. CBG in field was 178 and BP 144/82.   After brief exam at ED bridge, patient was taken to CT suite emergently.   Exam in CT showed some left neglect with noted positive extinction on the left to light touch. Consideration was given to tPA, but decision was made to not administer due to hx of SDH. After imaging showed right MCA M2 branch occlusion, decision was made to take patient to IR. IR consent obtained by IR MD. Patient was intubated for procedure.   The patient will be admitted by neurology to ICU. At the time of this note, results from IR are not known.   Addendum: IR-per note: Revascularization of occluded dominant right MCA inferior division of distal M2/3 with placement of stent and right ICA baloon angioplasty.   LKW: 0915 tPA Given: no, history of SDH IRThombectomy: patient taken to IR MRS: 3  Past Medical History:  Diagnosis Date  . Allergy    Rhinitis  . Anemia    NOS iron deficient and B12 deficient  . Arthritis   . 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 Diabetes mellitus    Type 2  . GERD (gastroesophageal reflux disease)   . Hyperlipidemia   . Hypertension   . 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 OSA (obstructive sleep apnea)    cpap  . TBI (traumatic brain injury) [facility])     Past Surgical History:  Procedure Laterality Date  . APPENDECTOMY     . arm fracture Left 2011   with hardware  . BURR HOLE Bilateral 08/04/2017   Procedure: Haskell Flirt;  Surgeon: [name], MD;  Location: [place];  Service: Neurosurgery;  Laterality: Bilateral;  . COLONOSCOPY    . CRANIOTOMY Left 12/01/2017   Procedure: Trudee Kuster Holes CRANIOTOMY HEMATOMA EVACUATION SUBDURAL;  Surgeon: [name], MD;  Location: [place];  Service: Neurosurgery;  Laterality: Left;  . ESOPHAGOGASTRODUODENOSCOPY  2006   gastritis  . PACEMAKER IMPLANT N/A 05/14/2020   Procedure: PACEMAKER IMPLANT;  Surgeon: [name], MD;  Location: [place] [facility];  Service: Cardiovascular;  Laterality: N/A;  . ROTATOR CUFF REPAIR    . SEPTOPLASTY  1959   Deviated Septum  . 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 Heart disease Mother   . Breast cancer Mother   . Stroke Father   . Allergies Father        Father and children  . Coronary artery disease Brother   . Diabetes Neg Hx   . Colon cancer Neg Hx    Social History:  reports that he quit smoking about 37 years ago. He smoked 1.00 pack per day. He has never used smokeless tobacco. He reports current alcohol use of about 2.0 standard  drinks of alcohol per week. He reports that he does not use drugs.  Allergies:  Allergies  Allergen Reactions  . Lisinopril Cough  . Invokana [Canagliflozin] Other (See Comments)    Stopped by MD  . Penicillins Other (See Comments)    Allergic as a child.  Patient does not remember reaction.  Has had cephalasporins without problems.  . Sulfamethoxazole Other (See Comments)    Unknown reaction- from childhood    Medications: Norvasc, Lipitor, Cardura, Prozac, Glucotrol, Micardis, Glucatrol, Xyzal, Protonix, Toujeo, and Vesicare.    ROS: Negative ROS except as noted in HPI.   Physical Examination: General: Appears well, NAD. Obese.   Psych: Affect appropriate. Calm. Cooperative.  HEENT-  Normocephalic, AT. Well healed scar to head. No  lymphadenopathy. No thyromegaly. No stiffness of neck.  Cardiovascular - RRR. No LE edema.  Lungs - Normal respiratory effort. SaO2 93%.  Abdomen - soft, NT Extremities - warm, well perfused Skin: WDI  NEURO:   NIHSS:  1a Level of Conscious: 0 1b LOC Questions: 0 1c LOC Commands: 0 2 Best Gaze: 0 3 Visual: 0 4 Facial Palsy: 1 5a Motor Arm - left: 0 5b Motor Arm - Right: 0 6a Motor Leg - Left: 0 6b Motor Leg - Right: 0 7 Limb Ataxia: 1 8 Sensory: 1 9 Best Language: 0 10 Dysarthria: 0 11 Extinct and Inattention: 1 TOTAL: 3  Mental Status: AA&Ox3  Speech/Language: speech is without dysarthria.  No aphasia. Noted left sided neglect. No gaze preference. Naming, repetition, fluency, and comprehension intact. Cranial Nerves:  II: PERRL. Visual fields full.  III, IV, VI: EOMI. Eyelids elevate symmetrically.  V: Sensation is intact to light touch and symmetrical to face.  VII: right sided facial droop noted.  VIII: hearing intact to voice. IX, X: Palate elevates symmetrically. Phonation is normal.  XF:GHWEXHBZ shrug 5/5. XII: tongue is midline without fasciculations. Motor: 5/5 strength to all muscle groups tested.  Tone: is normal and bulk is normal Sensation- Intact to light touch bilaterally. Extinction present left side to light touch to DSS. Coordination: FTN intact bilaterally, HKS: no ataxia in BLE. No drift.  DTRs: 1+ UE, 0 patella Gait- deferred  Labs pertinent to this encounter: INR  1.0    aPTT  32    LDL 47 08/21/20     HbA1c 7.6  08/21/20  Imaging: Independently reviewed by MD.   CTA head and neck + perfusion 1. Positive for emergent occlusion of a posterior Right MCA distal M2 branch at its bifurcation. Embolized calcification there is new since last year (series 12, image 14). 2. CT Perfusion detects a small infarct core of 13 mL with regional penumbra of 41 mL (28 mL mismatch). 3. Superimposed very bulky calcified plaque at the Right ICA origin with  tandem HIGH-GRADE STENOSIS, including Radiographic String Sign (series 7, image 216). 4. The above were reviewed in person with Dr. Roland Rack on 09/09/2020 at 1128 hours. 5. 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 6. Bulky Left Carotid bifurcation and proximal left ICA calcified plaque without significant stenosis. 7. Up to moderate Right Vertebral Artery origin stenosis.  Assessment: 80 yo male who presented as a code stroke today after being found to have difficulty ambulating with right facial droop. He exhibited left sided neglect on exam. No tPA due to history of SDH in 2019. After CTA exhibited occlusion in right MCA distal M2 branch, patient was taken to IR.   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   Plan:  - Neurology admit to ICU  - HgbA1c, fasting lipid panel - LDL goal < 70, increase statin if over 70 - MRI of the brain without contrast -BP control 120-140 x 24 hrs. -Cleviprex prn for BP control.  - Frequent neuro checks - NIHSS checks per protocol.  - TTE - ASA 81mg  daily starting 09/10/20 - Brilinta 90mg  po bid - Risk factor modification - Telemetry monitoring - PT consult, OT consult, Speech consult - Stroke team to follow    Patient seen by Clance Boll, NP/Neurology and MD. Note and plan to be edited by MD as necessary.   I was present for the entirety of the above evaluation/management.  He is not an IV TPA candidate due to his chronic subdurals with question about interval hemorrhage on the right.  He is a candidate for intra-arterial treatment and he was taken emergently for thrombectomy of a calcified embolus.  I suspect that this is embolic from his carotid arteries.  Though his NIH was not particularly high, he had severe,  debilitating left hemineglect.  Due to persistent severe disease after reestablishing flow, a stent was needed to be placed and he was started on dual antiplatelet therapy.  Unfortunately he does have some extravasation of contrast versus hemorrhage, but dual antiplatelet therapy will need to be continued for the time being.  This patient is critically ill and at significant risk of neurological worsening, death and care requires constant monitoring of vital signs, hemodynamics,respiratory and cardiac monitoring, neurological assessment, discussion with family, other specialists and medical decision making of high complexity. I spent 65 minutes of neurocritical care time  in the care of  this patient. This was time spent independent of any time provided by nurse practitioner or PA.  Roland Rack, MD [facility] [phone]  If 7pm- 7am, please page neurology on call as listed in [place]. 09/09/2020  7:32 PM

## 2020-09-09 NOTE — ED Provider Notes (Signed)
[facility] EMERGENCY DEPARTMENT Provider Note   CSN: [id number] Arrival date & time: 09/09/20  1056  An emergency department physician performed an initial assessment on this suspected stroke patient at 1057.  History Chief Complaint  Patient presents with  . Code Stroke    Frank [name] is a 80 y.o. male.  HPI Was at outpatient rehabilitation facility.  He had a sudden onset of a new right sided facial droop with last known well time of 915.  With that, the patient was noted to be confused and dizzy.  Patient arrives by EMS with code stroke.  Patient has a history of surgically managed subacute subdural hematomas May 2019.  Patient does not endorse headache, chest pain or shortness of breath.    Past Medical History:  Diagnosis Date  . Allergy    Rhinitis  . Anemia    NOS iron deficient and B12 deficient  . Arthritis   . 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 Diabetes mellitus    Type 2  . GERD (gastroesophageal reflux disease)   . Hyperlipidemia   . Hypertension   . 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 OSA (obstructive sleep apnea)    cpap  . TBI (traumatic brain injury) [facility])     Patient Active Problem List   Diagnosis Date Noted  . 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 Chronic renal disease, stage 3, moderately decreased glomerular filtration rate (GFR) between 30-59 mL/min/1.73 square meter ([place]) 11/21/2018  . GERD without esophagitis 07/22/2017  . Depression   . Type 2 diabetes mellitus with peripheral neuropathy ([name])   . OAB (overactive bladder) 11/26/2014  . Routine general medical examination at a health care facility 04/06/2014  . Vitamin D deficiency 07/23/2010  . Obstructive sleep apnea 09/13/2008  . Hyperlipidemia with target LDL less than 100 07/27/2007  . BPH (benign prostatic hyperplasia) 06/21/2007  . 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 B12 deficiency anemia 04/15/2006  . Essential hypertension 04/15/2006  . Allergic rhinitis 04/15/2006    Past Surgical History:  Procedure Laterality Date  . APPENDECTOMY    . arm fracture Left 2011   with hardware  . BURR HOLE Bilateral 08/04/2017   Procedure: Haskell Flirt;  Surgeon: [name], MD;  Location: [facility];  Service: Neurosurgery;  Laterality: Bilateral;  . COLONOSCOPY    . CRANIOTOMY Left 12/01/2017   Procedure: Trudee Kuster Holes CRANIOTOMY HEMATOMA EVACUATION SUBDURAL;  Surgeon: [name], MD;  Location: [facility];  Service: Neurosurgery;  Laterality: Left;  . ESOPHAGOGASTRODUODENOSCOPY  2006   gastritis  . 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 ROTATOR CUFF REPAIR    . SEPTOPLASTY  1959   Deviated Septum  . 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 Heart disease Mother   . Breast cancer Mother   . Stroke Father   . Allergies Father        Father and children  . Coronary artery disease Brother   . Diabetes Neg Hx   . Colon cancer Neg Hx     Social History   Tobacco Use  . Smoking status: Former Smoker    Packs/day: 1.00    Quit date: 07/14/1983  Years since quitting: 37.1  . Smokeless tobacco: Never Used  Vaping Use  . Vaping Use: Never used  Substance Use Topics  . Alcohol use: Yes    Alcohol/week: 2.0 standard drinks    Types: 2 Standard drinks or equivalent per week    Comment: occassionally  . Drug use: No    Home Medications Prior to Admission medications   Medication Sig Start Date End Date Taking? Authorizing Provider  acetaminophen (TYLENOL) 325 MG tablet Take 1-2 tablets (325-650 mg total) by mouth every 4 (four) hours as needed for mild pain. 05/15/20   Shirley Friar, PA-C  amLODipine (NORVASC) 10 MG tablet Take 10 mg by mouth at  bedtime.    [provider]  atorvastatin (LIPITOR) 20 MG tablet TAKE ONE-HALF TABLET BY  MOUTH DAILY Patient taking differently: Take 10 mg by mouth daily. 06/06/20   Janith Lima, MD  azelastine (ASTELIN) 0.1 % nasal spray Place 1 spray into both nostrils 2 (two) times daily. Use in each nostril as directed 11/27/19   Janith Lima, MD  Carbonyl Iron 25 MG TABS Take 25 mg by mouth daily with breakfast.     [provider]  doxazosin (CARDURA) 4 MG tablet TAKE ONE-HALF TABLET BY  MOUTH AT BEDTIME 06/06/20   Janith Lima, MD  FLUoxetine (PROZAC) 20 MG capsule Take 1 capsule (20 mg total) by mouth daily. 04/10/20   Janith Lima, MD  glipiZIDE (GLUCOTROL XL) 10 MG 24 hr tablet Take 1 tablet (10 mg total) by mouth daily. 04/10/20   Janith Lima, MD  Glucagon (GVOKE HYPOPEN 2-PACK) 1 MG/0.2ML SOAJ Inject 1 Act into the skin daily as needed. 04/30/20   Janith Lima, MD  insulin glargine, 2 Unit Dial, (TOUJEO MAX SOLOSTAR) 300 UNIT/ML Solostar Pen Inject 10 Units into the skin daily. 04/16/20   Janith Lima, MD  Insulin Pen Needle 32G X 6 MM MISC 1 Act by Does not apply route daily. 04/08/20   Janith Lima, MD  levocetirizine (XYZAL) 5 MG tablet Take 1 tablet (5 mg total) by mouth every evening. 11/27/19   Janith Lima, MD  mupirocin ointment (BACTROBAN) 2 % Apply 1 application topically 2 (two) times daily. 05/31/20   Marrian Salvage, FNP  pantoprazole (PROTONIX) 40 MG tablet TAKE 1 TABLET BY MOUTH  DAILY 04/30/20   Janith Lima, MD  PRESCRIPTION MEDICATION CPAP- At bedtime    [provider]  psyllium (METAMUCIL) 58.6 % powder Take 1 packet by mouth daily as needed (for constipation- MIX AND DRINK).     [provider]  solifenacin (VESICARE) 10 MG tablet TAKE 1 TABLET BY MOUTH  DAILY 08/09/20   Janith Lima, MD  telmisartan (MICARDIS) 20 MG tablet TAKE 1 TABLET BY MOUTH  DAILY Patient taking differently: Take 20 mg by mouth daily.  09/07/20   Janith Lima, MD  triamcinolone cream (KENALOG) 0.1 % Apply 1 application topically daily as needed (to affected areas).  07/26/19   [provider]    Allergies    Lisinopril, Invokana [canagliflozin], Penicillins, and Sulfamethoxazole  Review of Systems   Review of Systems 10 systems reviewed and negative except as per HPI Physical Exam Updated Vital Signs BP (!) 156/69   Pulse 74   Temp 97.9 F (36.6 C) (Oral)   Resp 19   Ht 5\' 9"  (1.753 m)   Wt 99 kg   SpO2 96%   BMI 32.23 kg/m  Physical Exam Constitutional:      Comments: Alert and following commands.  No respiratory distress.   HENT:     Head:     Comments: Old surgical scars well-healed from craniotomy.    Mouth/Throat:     Pharynx: Oropharynx is clear.  Eyes:     Extraocular Movements: Extraocular movements intact.  Cardiovascular:     Rate and Rhythm: Normal rate and regular rhythm.  Pulmonary:     Comments: No respiratory distress.  Breath sounds are very soft at the bases.  No gross crackle or wheeze. Abdominal:     General: There is no distension.     Palpations: Abdomen is soft.     Tenderness: There is no abdominal tenderness. There is no guarding.  Musculoskeletal:        General: No swelling or tenderness.     Comments: Trace pitting at the ankles and pretibial surfaces.  Neurological:     Comments: Patient is following commands appropriately.  He does have subtle right facial droop.  No perceivable grip strength differential.  Patient will move to elevate both lower extremities at command  Psychiatric:        Mood and Affect: Mood normal.     ED Results / Procedures / Treatments   Labs (all labs ordered are listed, but only abnormal results are displayed) Labs Reviewed  CBC - Abnormal; Notable for the following components:      Result Value   RBC 4.14 (*)    Hemoglobin 11.2 (*)    HCT 34.8 (*)    Platelets 124 (*)    All other components within normal limits   COMPREHENSIVE METABOLIC PANEL - Abnormal; Notable for the following components:   Sodium 133 (*)    Glucose, Bld 163 (*)    BUN 35 (*)    Creatinine, Ser 1.90 (*)    Calcium 8.8 (*)    Total Protein 6.4 (*)    GFR, Estimated 35 (*)    All other components within normal limits  CBG MONITORING, ED - Abnormal; Notable for the following components:   Glucose-Capillary 153 (*)    All other components within normal limits  I-STAT CHEM 8, ED - Abnormal; Notable for the following components:   BUN 47 (*)    Creatinine, Ser 1.80 (*)    Glucose, Bld 158 (*)    Calcium, Ion 1.12 (*)    Hemoglobin 12.2 (*)    HCT 36.0 (*)    All other components within normal limits  RESP PANEL BY RT-PCR (FLU A&B, COVID) ARPGX2  PROTIME-INR  APTT  DIFFERENTIAL  CBG MONITORING, ED    EKG EKG Interpretation  Date/Time:  Monday September 09 2020 11:23:52 EST Ventricular Rate:  72 PR Interval:    QRS Duration: 126 QT Interval:  436 QTC Calculation: 478 R Axis:   -72 Text Interpretation: Sinus or ectopic atrial rhythm Prolonged PR interval IVCD, consider atypical RBBB Inferior infarct, old Consider anterior infarct inferior and lateral IVCD new from previous with axis change. no STEMI Confirmed by Charlesetta Shanks [phone]) on 09/09/2020 12:05:42 PM   Radiology CT CEREBRAL PERFUSION W CONTRAST  Result Date: 09/09/2020 CLINICAL DATA:  80 year old male code stroke. Neglect. Right parietal lobe implicated. History of unresolved subdural hematomas. EXAM: CT ANGIOGRAPHY HEAD AND NECK CT PERFUSION BRAIN TECHNIQUE: Multidetector CT imaging of the head and neck was performed using the standard protocol during bolus administration of intravenous contrast. Multiplanar CT image reconstructions and MIPs were obtained to evaluate  the vascular anatomy. Carotid stenosis measurements (when applicable) are obtained utilizing NASCET criteria, using the distal internal carotid diameter as the denominator. Multiphase CT imaging  of the brain was performed following IV bolus contrast injection. Subsequent parametric perfusion maps were calculated using RAPID software. CONTRAST:  134mL OMNIPAQUE IOHEXOL 350 MG/ML SOLN COMPARISON:  Plain head CT 1105 hours today reported separately. Intracranial MRA 06/24/2017. FINDINGS: CT Brain Perfusion Findings: ASPECTS: 10 CBF (<30%) Volume: 73mL Perfusion (Tmax>6.0s) volume: 42mL.  Hypoperfusion index 0.1 Mismatch Volume: 18mL Infarction Location:Posterior right MCA territory. CTA NECK Skeleton: Previous left superior frontal craniotomy. Chronic bilateral calvarium burr holes. No acute osseous abnormality identified. Cervical spine degeneration. Upper chest: Partially visible left chest cardiac pacemaker. Otherwise negative. Other neck: Mild motion artifact. No acute findings identified in the neck. Aortic arch: 3 vessel arch configuration. Relatively mild Calcified aortic atherosclerosis. Right carotid system: Mild calcified plaque of the brachiocephalic artery without stenosis. Normal right CCA origin. Intermittent moderate calcified plaque in the right CCA proximal to the bifurcation, and at the bifurcation a radiographic string sign occurs at the ICA origin related to very bulky calcified plaque on series 5, image 108. Additional bulky plaque through the distal bulb, tandem stenosis on series 5, image 103. But the vessel remains patent. No additional stenosis to the skull base. Left carotid system: Mild left CCA origin but similar moderate left CCA and left carotid bifurcation through ICA bulb calcified plaque. On this side subsequent stenosis is less than 50 % with respect to the distal vessel. Vertebral arteries: Mild to moderate proximal right subclavian calcified plaque. Calcified plaque at the right vertebral artery origin with mild to moderate stenosis. Patent right vertebral then to the skull base. Mild proximal left subclavian and left vertebral artery origin calcified plaque without  stenosis. Tortuous left V1 segment. Codominant left vertebral is otherwise normal to the skull base. CTA HEAD Posterior circulation: Negative distal vertebral arteries and basilar. The left V4 is mildly dominant. Normal PICA origins. Patent SCA and PCA origins. Small posterior communicating arteries. Mild left PCA P1 irregularity. Bilateral PCA branches are within normal limits. Anterior circulation: Both ICA siphons are patent. On the left cavernous segment calcified plaque results in no stenosis. Normal left ophthalmic and posterior communicating artery origins. On the right cavernous segment calcified plaque results in no significant stenosis. Patent carotid termini, MCA and ACA origins. Dominant left A1. 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 Otherwise the bilateral ACA branches are within normal limits. Left MCA M1 segment and bifurcation are patent. Left MCA branches appears table since 2018. Right MCA M1 and bifurcation remain patent. Dominant posterior right MCA M2 branch is occluded at the level of punctate calcification on series 12, image 14. This is at a bifurcation where the superior branch remains patent. But the parent vessel is occluded. This calcification is new compared to last year. Venous sinuses: Patent. Anatomic variants: Dominant left ACA A1. Review of the MIP images confirms the above findings IMPRESSION: 1. Positive for emergent occlusion of a posterior Right MCA distal M2 branch at its bifurcation. Embolized calcification there is new since last year (series 12, image 14). 2. CT Perfusion detects a small infarct core of 13 mL with regional penumbra of 41 mL (28 mL mismatch). 3. Superimposed very bulky calcified plaque at the Right ICA origin with tandem HIGH-GRADE STENOSIS, including Radiographic String Sign (series 7, image 216). 4. The above were reviewed in person  with Dr. Roland Rack on 09/09/2020 at  1128 hours. 5. 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 6. Bulky Left Carotid bifurcation and proximal left ICA calcified plaque without significant stenosis. 7. Up to moderate Right Vertebral Artery origin stenosis. Electronically Signed   By: Genevie Ann M.D.   On: 09/09/2020 11:46   CT HEAD CODE STROKE WO CONTRAST  Result Date: 09/09/2020 CLINICAL DATA:  Code stroke. Neuro deficit, acute, stroke suspected. Additional history provided: Right-sided facial droop, last known well 9:15 a.m. EXAM: CT HEAD WITHOUT CONTRAST TECHNIQUE: Contiguous axial images were obtained from the base of the skull through the vertex without intravenous contrast. COMPARISON:  Head CT 02/08/2020. FINDINGS: Brain: Mild cerebral atrophy. Redemonstrated subdural hematomas overlying the bilateral cerebral convexities. These are unchanged in size as compared to the prior head CT of 02/08/2020 again measuring up to 9 mm in thickness bilaterally. Although intermediate to low-density, the density of the right subdural hematoma has subtly increased as compared to the prior examination which may reflect some degree of interval hemorrhage into the collection. 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 Scattered small foci of calcification within both collections. Symmetric mass effect upon the underlying cerebral hemispheres without midline shift. Ill-defined hypoattenuation within the cerebral white matter is nonspecific, but compatible with chronic small vessel ischemic disease. No acute demarcated cortical infarct. No evidence of intracranial mass. Vascular: No hyperdense vessel.  Atherosclerotic calcifications. Skull: No calvarial fracture.  Left-sided cranioplasty. Sinuses/Orbits: Partial opacification of left ethmoid air cells. Background mild bilateral ethmoid and sphenoid sinus mucosal thickening. ASPECTS ([place] Stroke Program Early CT Score) - Ganglionic level  infarction (caudate, lentiform nuclei, internal capsule, insula, M1-M3 cortex): 7 - Supraganglionic infarction (M4-M6 cortex): 3 Total score (0-10 with 10 being normal): 10 Dr. Kathrynn Speed aware of these findings at 11:20 a.m. on 09/09/2020 (discussed with Dr. Lars Pinks). IMPRESSION: Redemonstrated subdural hematomas overlying the bilateral cerebral convexities. As before, these hematomas measure up to 9 mm in greatest thickness. Although intermediate to low-density, the density of the right subdural hematoma has subtly increased from the examination of 02/08/2020, suggestive of some degree of interval hemorrhage into the collection. The left subdural hematoma remains low-density. Unchanged symmetric mass effect upon the cerebral hemispheres without midline shift. No acute demarcated cortical infarct is identified.  ASPECTS is 10. Stable generalized atrophy of the brain and chronic small vessel ischemic disease. Electronically Signed   By: Kellie Simmering DO   On: 09/09/2020 11:35   CT ANGIO HEAD CODE STROKE  Result Date: 09/09/2020 CLINICAL DATA:  80 year old male code stroke. Neglect. Right parietal lobe implicated. History of unresolved subdural hematomas. EXAM: CT ANGIOGRAPHY HEAD AND NECK CT PERFUSION BRAIN TECHNIQUE: Multidetector CT imaging of the head and neck was performed using the standard protocol during bolus administration of intravenous contrast. Multiplanar CT image reconstructions and MIPs were obtained to evaluate the vascular anatomy. Carotid stenosis measurements (when applicable) are obtained utilizing NASCET criteria, using the distal internal carotid diameter as the denominator. Multiphase CT imaging of the brain was performed following IV bolus contrast injection. Subsequent parametric perfusion maps were calculated using RAPID software. CONTRAST:  118mL OMNIPAQUE IOHEXOL 350 MG/ML SOLN COMPARISON:  Plain head CT 1105 hours today reported separately. Intracranial MRA 06/24/2017.  FINDINGS: CT Brain Perfusion Findings: ASPECTS: 10 CBF (<30%) Volume: 63mL Perfusion (Tmax>6.0s) volume: 46mL.  Hypoperfusion index 0.1 Mismatch Volume: 47mL Infarction Location:Posterior right MCA territory. CTA NECK Skeleton: Previous left superior frontal craniotomy. Chronic bilateral calvarium  burr holes. No acute osseous abnormality identified. Cervical spine degeneration. Upper chest: Partially visible left chest cardiac pacemaker. Otherwise negative. Other neck: Mild motion artifact. No acute findings identified in the neck. Aortic arch: 3 vessel arch configuration. Relatively mild Calcified aortic atherosclerosis. Right carotid system: Mild calcified plaque of the brachiocephalic artery without stenosis. Normal right CCA origin. Intermittent moderate calcified plaque in the right CCA proximal to the bifurcation, and at the bifurcation a radiographic string sign occurs at the ICA origin related to very bulky calcified plaque on series 5, image 108. Additional bulky plaque through the distal bulb, tandem stenosis on series 5, image 103. But the vessel remains patent. No additional stenosis to the skull base. Left carotid system: Mild left CCA origin but similar moderate left CCA and left carotid bifurcation through ICA bulb calcified plaque. On this side subsequent stenosis is less than 50 % with respect to the distal vessel. Vertebral arteries: Mild to moderate proximal right subclavian calcified plaque. Calcified plaque at the right vertebral artery origin with mild to moderate stenosis. Patent right vertebral then to the skull base. Mild proximal left subclavian and left vertebral artery origin calcified plaque without stenosis. Tortuous left V1 segment. Codominant left vertebral is otherwise normal to the skull base. CTA HEAD Posterior circulation: Negative distal vertebral arteries and basilar. The left V4 is mildly dominant. Normal PICA origins. Patent SCA and PCA origins. Small posterior communicating  arteries. Mild left PCA P1 irregularity. Bilateral PCA branches are within normal limits. Anterior circulation: Both ICA siphons are patent. On the left cavernous segment calcified plaque results in no stenosis. Normal left ophthalmic and posterior communicating artery origins. On the right cavernous segment calcified plaque results in no significant stenosis. Patent carotid termini, MCA and ACA origins. Dominant left A1. 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 Otherwise the bilateral ACA branches are within normal limits. Left MCA M1 segment and bifurcation are patent. Left MCA branches appears table since 2018. Right MCA M1 and bifurcation remain patent. Dominant posterior right MCA M2 branch is occluded at the level of punctate calcification on series 12, image 14. This is at a bifurcation where the superior branch remains patent. But the parent vessel is occluded. This calcification is new compared to last year. Venous sinuses: Patent. Anatomic variants: Dominant left ACA A1. Review of the MIP images confirms the above findings IMPRESSION: 1. Positive for emergent occlusion of a posterior Right MCA distal M2 branch at its bifurcation. Embolized calcification there is new since last year (series 12, image 14). 2. CT Perfusion detects a small infarct core of 13 mL with regional penumbra of 41 mL (28 mL mismatch). 3. Superimposed very bulky calcified plaque at the Right ICA origin with tandem HIGH-GRADE STENOSIS, including Radiographic String Sign (series 7, image 216). 4. The above were reviewed in person with Dr. Roland Rack on 09/09/2020 at 1128 hours. 5. 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 6. Bulky Left Carotid bifurcation and proximal left ICA calcified plaque without significant stenosis. 7. Up to moderate Right Vertebral Artery origin stenosis. Electronically Signed   By: Genevie Ann  M.D.   On: 09/09/2020 11:46   CT ANGIO NECK CODE STROKE  Result Date: 09/09/2020 CLINICAL DATA:  80 year old male code stroke. Neglect. Right parietal lobe implicated. History of unresolved subdural hematomas. EXAM: CT ANGIOGRAPHY HEAD AND NECK CT PERFUSION BRAIN TECHNIQUE: Multidetector CT imaging of the head and neck  was performed using the standard protocol during bolus administration of intravenous contrast. Multiplanar CT image reconstructions and MIPs were obtained to evaluate the vascular anatomy. Carotid stenosis measurements (when applicable) are obtained utilizing NASCET criteria, using the distal internal carotid diameter as the denominator. Multiphase CT imaging of the brain was performed following IV bolus contrast injection. Subsequent parametric perfusion maps were calculated using RAPID software. CONTRAST:  172mL OMNIPAQUE IOHEXOL 350 MG/ML SOLN COMPARISON:  Plain head CT 1105 hours today reported separately. Intracranial MRA 06/24/2017. FINDINGS: CT Brain Perfusion Findings: ASPECTS: 10 CBF (<30%) Volume: 5mL Perfusion (Tmax>6.0s) volume: 60mL.  Hypoperfusion index 0.1 Mismatch Volume: 59mL Infarction Location:Posterior right MCA territory. CTA NECK Skeleton: Previous left superior frontal craniotomy. Chronic bilateral calvarium burr holes. No acute osseous abnormality identified. Cervical spine degeneration. Upper chest: Partially visible left chest cardiac pacemaker. Otherwise negative. Other neck: Mild motion artifact. No acute findings identified in the neck. Aortic arch: 3 vessel arch configuration. Relatively mild Calcified aortic atherosclerosis. Right carotid system: Mild calcified plaque of the brachiocephalic artery without stenosis. Normal right CCA origin. Intermittent moderate calcified plaque in the right CCA proximal to the bifurcation, and at the bifurcation a radiographic string sign occurs at the ICA origin related to very bulky calcified plaque on series 5, image 108.  Additional bulky plaque through the distal bulb, tandem stenosis on series 5, image 103. But the vessel remains patent. No additional stenosis to the skull base. Left carotid system: Mild left CCA origin but similar moderate left CCA and left carotid bifurcation through ICA bulb calcified plaque. On this side subsequent stenosis is less than 50 % with respect to the distal vessel. Vertebral arteries: Mild to moderate proximal right subclavian calcified plaque. Calcified plaque at the right vertebral artery origin with mild to moderate stenosis. Patent right vertebral then to the skull base. Mild proximal left subclavian and left vertebral artery origin calcified plaque without stenosis. Tortuous left V1 segment. Codominant left vertebral is otherwise normal to the skull base. CTA HEAD Posterior circulation: Negative distal vertebral arteries and basilar. The left V4 is mildly dominant. Normal PICA origins. Patent SCA and PCA origins. Small posterior communicating arteries. Mild left PCA P1 irregularity. Bilateral PCA branches are within normal limits. Anterior circulation: Both ICA siphons are patent. On the left cavernous segment calcified plaque results in no stenosis. Normal left ophthalmic and posterior communicating artery origins. On the right cavernous segment calcified plaque results in no significant stenosis. Patent carotid termini, MCA and ACA origins. Dominant left A1. 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 Otherwise the bilateral ACA branches are within normal limits. Left MCA M1 segment and bifurcation are patent. Left MCA branches appears table since 2018. Right MCA M1 and bifurcation remain patent. Dominant posterior right MCA M2 branch is occluded at the level of punctate calcification on series 12, image 14. This is at a bifurcation where the superior branch remains patent. But the parent vessel is occluded. This  calcification is new compared to last year. Venous sinuses: Patent. Anatomic variants: Dominant left ACA A1. Review of the MIP images confirms the above findings IMPRESSION: 1. Positive for emergent occlusion of a posterior Right MCA distal M2 branch at its bifurcation. Embolized calcification there is new since last year (series 12, image 14). 2. CT Perfusion detects a small infarct core of 13 mL with regional penumbra of 41 mL (28 mL mismatch). 3. Superimposed very bulky calcified plaque at the  Right ICA origin with tandem HIGH-GRADE STENOSIS, including Radiographic String Sign (series 7, image 216). 4. The above were reviewed in person with Dr. Roland Rack on 09/09/2020 at 1128 hours. 5. 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 6. Bulky Left Carotid bifurcation and proximal left ICA calcified plaque without significant stenosis. 7. Up to moderate Right Vertebral Artery origin stenosis. Electronically Signed   By: Genevie Ann M.D.   On: 09/09/2020 11:46    Procedures Procedures   Medications Ordered in ED Medications  sodium chloride flush (NS) 0.9 % injection 3 mL (has no administration in time range)  aspirin 81 MG chewable tablet (has no administration in time range)  verapamil (ISOPTIN) 2.5 MG/ML injection (has no administration in time range)  ticagrelor (BRILINTA) 90 MG tablet (has no administration in time range)  aspirin 325 MG tablet (has no administration in time range)  nitroGLYCERIN 100 mcg/mL intra-arterial injection (has no administration in time range)  vancomycin (VANCOCIN) 1-5 GM/200ML-% IVPB (has no administration in time range)  iohexol (OMNIPAQUE) 300 MG/ML solution 50 mL (has no administration in time range)  iohexol (OMNIPAQUE) 350 MG/ML injection 100 mL (100 mLs Intravenous Contrast Given 09/09/20 1118)    ED Course  I have reviewed the triage vital signs and the nursing notes.  Pertinent labs & imaging results that were  available during my care of the patient were reviewed by me and considered in my medical decision making (see chart for details).    MDM Rules/Calculators/A&P                          Consult: Patient arrives as code stroke.  Seen by neurology team on arrival.  Patient presents as a code stroke.  He is alert upon arrival.  Heart rate and blood pressure are stable.  Airway is patent.  CT has identified margin of occlusion of posterior right MCA with an embolized calcification.  Associated infarct identified.  After review by stroke team with Dr. Leonel Ramsay, decision has been made for interventional management.  Patient is taken to IR alert, stable vital signs no acute distress at this time. Final Clinical Impression(s) / ED Diagnoses Final diagnoses:  Stroke (cerebrum) [facility])    Rx / DC Orders ED Discharge Orders    None       Charlesetta Shanks, MD 09/09/20 1208

## 2020-09-09 NOTE — Code Documentation (Signed)
Stroke Response Nurse Documentation Code Documentation  Brooks Kinnan is a 80 y.o. male arriving to [place] ED via [facility] on 09/09/2020 with past medical hx of HLD, HTN, TBI (3 Years ago) with Hematoma Evacuation. Code stroke was activated by EMS. Patient from home where he was LKW at [place] and now complaining of dizziness and confusion per family.   Stroke team at the bedside on patient arrival. Labs drawn and patient cleared for CT by Dr. Johnney Killian. Patient to CT with team. NIHSS 4, see documentation for details and code stroke times. Patient with right facial droop, left decreased sensation and Visual  neglect on exam. The following imaging was completed:  CT, CTA head and neck, CTP. Patient is not a candidate for tPA due to hx of hemorrhage per MD Leonel Ramsay. CTA showed potential M2/M3 occlusion per MD and patient was taken to IR. Care/Plan: Admit to ICU. Post-IR assessments. Bedside handoff with IR RN Elta Guadeloupe.    Kathrin Greathouse  Stroke Response RN

## 2020-09-09 NOTE — Sedation Documentation (Signed)
Pt extubated

## 2020-09-09 NOTE — Procedures (Signed)
S/P RT common carotid arteriogram RT CFA approach. S/P complete revascularization of occluded domonant Rt MCA inf division distal M2/M3 region with x 1 pass with contact aspiration of x1 pass with Tiger 17 rerriever and aspiration achieving a TICI 3 revascularization . 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 S/P RT ICA balloon angioplasty for sig stenosis due to underlying complex mixedICA plaque with 50  % patency. Post CT x2 . RT sylvian fissure and RT frontal temp convexity hyper density?SAH with contrast. No massd effect. 50F angioseal for Rt groin hemostasis. Extubated. Follows simple commands. Moves all 4s equally. Distal pulses all intact. S.Savanna Dooley.MD

## 2020-09-09 NOTE — Anesthesia Procedure Notes (Signed)
Arterial Line Insertion Start/End2/28/2022 12:05 PM, 09/09/2020 12:15 PM Performed by: Lance Coon, CRNA  Patient location: OR. Preanesthetic checklist: patient identified, IV checked, site marked, risks and benefits discussed, surgical consent, monitors and equipment checked, pre-op evaluation, timeout performed and anesthesia consent Left, radial was placed Catheter size: 20 G Maximum sterile barriers used   Attempts: 1 Procedure performed without using ultrasound guided technique. Following insertion, dressing applied and Biopatch. Post procedure assessment: normal  Patient tolerated the procedure well with no immediate complications.

## 2020-09-09 NOTE — Progress Notes (Signed)
Interventional Radiology Brief Note:  PA to bedside alongside Dr. Estanislado Pandy to assess s/p R MCA thrombectomy achieving TICI 3 revascularization.   Patient resting comfortably on 4N.  Alert, oriented.  Participating in conversation. Following commands.  PERRL, tongue midline, speech fluent, strength 5/5 in upper left extremitiy, hand grip strength intact, moving lower extremties.  Groin with some fresh drainage on dressing.  Gauze taken down to assess.  Procedure site intact without active ooze.  Drainage likely weeping from tract.  RN to replace dressing.  Keep right leg straight.   IR following.   Brynda Greathouse, MS RD PA-C

## 2020-09-09 NOTE — Progress Notes (Signed)
  Echocardiogram 2D Echocardiogram has been performed with Definity.  Frank [name] 09/09/2020, 4:36 PM

## 2020-09-09 NOTE — Telephone Encounter (Signed)
Janett Billow states Frank [name] developed confusion and bumping into things this morning. No severe breathing difficulty or blueness around around his lips. No chest pain. No weakness/lackness on one side of his face or body. No known injury in the past 3 days. No fever    [facility] has advised to call EMS 911 now. Patient has understood

## 2020-09-09 NOTE — Telephone Encounter (Signed)
Patients wife and health care aid calling states the patient has had sudden onset dizziness and confusion. Patient states he feels fine but they have noticed a significant change. Transferred to team health for further evaluation.

## 2020-09-09 NOTE — Anesthesia Preprocedure Evaluation (Signed)
Anesthesia Evaluation  Patient identified by MRN, date of birth, ID band Patient awake    Reviewed: Allergy & Precautions, NPO status , Patient's Chart, lab work & pertinent test resultsPreop documentation limited or incomplete due to emergent nature of procedure.  Airway Mallampati: II  TM Distance: >3 FB     Dental   Pulmonary sleep apnea , former smoker,    Pulmonary exam normal        Cardiovascular hypertension, Normal cardiovascular exam+ dysrhythmias + pacemaker  Rhythm:Regular Rate:Normal     Neuro/Psych CVA    GI/Hepatic Neg liver ROS, GERD  ,  Endo/Other  diabetes, Type 2, Insulin Dependent, Oral Hypoglycemic Agents  Renal/GU CRFRenal disease     Musculoskeletal  (+) Arthritis ,   Abdominal   Peds  Hematology  (+) anemia ,   Anesthesia Other Findings   Reproductive/Obstetrics                             Anesthesia Physical Anesthesia Plan  ASA: IV and emergent  Anesthesia Plan: General   Post-op Pain Management:    Induction: Intravenous and Rapid sequence  PONV Risk Score and Plan: 2 and Ondansetron, Dexamethasone and Treatment may vary due to age or medical condition  Airway Management Planned: Oral ETT and Video Laryngoscope Planned  Additional Equipment: Arterial line  Intra-op Plan:   Post-operative Plan: Extubation in OR  Informed Consent: I have reviewed the patients History and Physical, chart, labs and discussed the procedure including the risks, benefits and alternatives for the proposed anesthesia with the patient or authorized representative who has indicated his/her understanding and acceptance.     Dental advisory given  Plan Discussed with: CRNA  Anesthesia Plan Comments:         Anesthesia Quick Evaluation

## 2020-09-09 NOTE — Progress Notes (Signed)
Patient ID: Frank [name], male   DOB: 01/24/1941, 80 y.o.   MRN: [id number] 79 Y RT H M LSW 915 am  New onset  Confusion and dizziness ,lt visual neglect and decreased sensation. CT brain No new ICH. ASPECTS 10. CTA occluded RT MCA distal M2 of inf division due to calcified thrombus. CTP .core of 51ml with Tmax>6s of 39ml. Endovascular treatment D/W patient and spouse in a 3 way call. Procedure,reasons alternatives reviewed. Risks of ICH of 10 % worsening Neuro deficit,death and inability to revascularize discussed.  Spouse expressed understanding and provided consent to proceed. S.Kishon Garriga MD

## 2020-09-09 NOTE — Transfer of Care (Signed)
Immediate Anesthesia Transfer of Care Note  Patient: Frank [name]  Procedure(s) Performed: IR WITH ANESTHESIA (N/A )  Patient Location: ICU  Anesthesia Type:General  Level of Consciousness: awake and alert   Airway & Oxygen Therapy: Patient Spontanous Breathing and Patient connected to face mask oxygen  Post-op Assessment: Report given to RN, Post -op Vital signs reviewed and stable, Patient moving all extremities X 4 and Patient able to stick tongue midline  Post vital signs: Reviewed and stable  Last Vitals:  Vitals Value Taken Time  BP 135/49 09/09/20 1525  Temp    Pulse 76 09/09/20 1527  Resp 15 09/09/20 1527  SpO2 94 % 09/09/20 1527  Vitals shown include unvalidated device data.  Last Pain:  Vitals:   09/09/20 1105  TempSrc:   PainSc: 0-No pain         Complications: No complications documented.

## 2020-09-10 ENCOUNTER — Inpatient Hospital Stay (HOSPITAL_COMMUNITY): Payer: Medicare Other

## 2020-09-10 ENCOUNTER — Encounter (HOSPITAL_COMMUNITY): Payer: Self-pay | Admitting: Radiology

## 2020-09-10 DIAGNOSIS — I609 Nontraumatic subarachnoid hemorrhage, unspecified: Secondary | ICD-10-CM

## 2020-09-10 DIAGNOSIS — I619 Nontraumatic intracerebral hemorrhage, unspecified: Secondary | ICD-10-CM

## 2020-09-10 DIAGNOSIS — I6601 Occlusion and stenosis of right middle cerebral artery: Secondary | ICD-10-CM | POA: Diagnosis not present

## 2020-09-10 DIAGNOSIS — I63512 Cerebral infarction due to unspecified occlusion or stenosis of left middle cerebral artery: Secondary | ICD-10-CM | POA: Diagnosis not present

## 2020-09-10 LAB — CBC WITH DIFFERENTIAL/PLATELET
Abs Immature Granulocytes: 0.04 10*3/uL (ref 0.00–0.07)
Basophils Absolute: 0.1 10*3/uL (ref 0.0–0.1)
Basophils Relative: 1 %
Eosinophils Absolute: 0 10*3/uL (ref 0.0–0.5)
Eosinophils Relative: 0 %
HCT: 28.9 % — ABNORMAL LOW (ref 39.0–52.0)
Hemoglobin: 9.6 g/dL — ABNORMAL LOW (ref 13.0–17.0)
Immature Granulocytes: 1 %
Lymphocytes Relative: 8 %
Lymphs Abs: 0.7 10*3/uL (ref 0.7–4.0)
MCH: 27.4 pg (ref 26.0–34.0)
MCHC: 33.2 g/dL (ref 30.0–36.0)
MCV: 82.6 fL (ref 80.0–100.0)
Monocytes Absolute: 0.8 10*3/uL (ref 0.1–1.0)
Monocytes Relative: 10 %
Neutro Abs: 6.7 10*3/uL (ref 1.7–7.7)
Neutrophils Relative %: 80 %
Platelets: 143 10*3/uL — ABNORMAL LOW (ref 150–400)
RBC: 3.5 MIL/uL — ABNORMAL LOW (ref 4.22–5.81)
RDW: 13.4 % (ref 11.5–15.5)
WBC: 8.2 10*3/uL (ref 4.0–10.5)
nRBC: 0 % (ref 0.0–0.2)

## 2020-09-10 LAB — LIPID PANEL
Cholesterol: 107 mg/dL (ref 0–200)
HDL: 36 mg/dL — ABNORMAL LOW (ref >40)
LDL Cholesterol: 29 mg/dL (ref 0–99)
Total CHOL/HDL Ratio: 3 RATIO
Triglycerides: 210 mg/dL — ABNORMAL HIGH (ref <150)
VLDL: 42 mg/dL — ABNORMAL HIGH (ref 0–40)

## 2020-09-10 LAB — GLUCOSE, CAPILLARY
Glucose-Capillary: 158 mg/dL — ABNORMAL HIGH (ref 70–99)
Glucose-Capillary: 171 mg/dL — ABNORMAL HIGH (ref 70–99)
Glucose-Capillary: 176 mg/dL — ABNORMAL HIGH (ref 70–99)
Glucose-Capillary: 180 mg/dL — ABNORMAL HIGH (ref 70–99)
Glucose-Capillary: 191 mg/dL — ABNORMAL HIGH (ref 70–99)

## 2020-09-10 LAB — BASIC METABOLIC PANEL
Anion gap: 7 (ref 5–15)
BUN: 34 mg/dL — ABNORMAL HIGH (ref 8–23)
CO2: 23 mmol/L (ref 22–32)
Calcium: 8.2 mg/dL — ABNORMAL LOW (ref 8.9–10.3)
Chloride: 106 mmol/L (ref 98–111)
Creatinine, Ser: 1.98 mg/dL — ABNORMAL HIGH (ref 0.61–1.24)
GFR, Estimated: 33 mL/min — ABNORMAL LOW (ref >60)
Glucose, Bld: 182 mg/dL — ABNORMAL HIGH (ref 70–99)
Potassium: 4.6 mmol/L (ref 3.5–5.1)
Sodium: 136 mmol/L (ref 135–145)

## 2020-09-10 LAB — RAPID URINE DRUG SCREEN, HOSP PERFORMED
Amphetamines: NOT DETECTED
Barbiturates: NOT DETECTED
Benzodiazepines: NOT DETECTED
Cocaine: NOT DETECTED
Opiates: NOT DETECTED
Tetrahydrocannabinol: NOT DETECTED

## 2020-09-10 LAB — HEMOGLOBIN A1C
Hgb A1c MFr Bld: 7.8 % — ABNORMAL HIGH (ref 4.8–5.6)
Mean Plasma Glucose: 177.16 mg/dL

## 2020-09-10 MED ORDER — LABETALOL HCL 5 MG/ML IV SOLN
10.0000 mg | INTRAVENOUS | Status: DC | PRN
  Administered 2020-09-10 (×2): 10 mg via INTRAVENOUS
  Filled 2020-09-10 (×2): qty 4

## 2020-09-10 MED ORDER — HYDRALAZINE HCL 20 MG/ML IJ SOLN
20.0000 mg | Freq: Four times a day (QID) | INTRAMUSCULAR | Status: DC | PRN
  Administered 2020-09-10 – 2020-09-11 (×3): 20 mg via INTRAVENOUS
  Filled 2020-09-10 (×3): qty 1

## 2020-09-10 MED ORDER — SODIUM CHLORIDE 0.9 % IV SOLN
750.0000 mg | Freq: Two times a day (BID) | INTRAVENOUS | Status: DC
  Administered 2020-09-10 – 2020-09-11 (×3): 750 mg via INTRAVENOUS
  Filled 2020-09-10 (×4): qty 7.5

## 2020-09-10 MED ORDER — LABETALOL HCL 5 MG/ML IV SOLN
40.0000 mg | Freq: Once | INTRAVENOUS | Status: AC
  Administered 2020-09-10: 40 mg via INTRAVENOUS
  Filled 2020-09-10: qty 8

## 2020-09-10 MED ORDER — LABETALOL HCL 5 MG/ML IV SOLN
10.0000 mg | INTRAVENOUS | Status: DC | PRN
  Administered 2020-09-10 – 2020-09-11 (×4): 10 mg via INTRAVENOUS
  Filled 2020-09-10 (×4): qty 4

## 2020-09-10 MED ORDER — CLEVIDIPINE BUTYRATE 0.5 MG/ML IV EMUL
0.0000 mg/h | INTRAVENOUS | Status: DC
  Administered 2020-09-10 – 2020-09-11 (×4): 21 mg/h via INTRAVENOUS
  Filled 2020-09-10 (×2): qty 100
  Filled 2020-09-10: qty 50

## 2020-09-10 MED ORDER — SODIUM CHLORIDE 0.9 % IV BOLUS
500.0000 mL | Freq: Once | INTRAVENOUS | Status: AC
  Administered 2020-09-10: 500 mL via INTRAVENOUS

## 2020-09-10 MED ORDER — SODIUM CHLORIDE 0.9 % IV SOLN
0.7500 ug/kg/min | INTRAVENOUS | Status: DC
  Filled 2020-09-10: qty 50

## 2020-09-10 MED ORDER — TICAGRELOR 90 MG PO TABS
45.0000 mg | ORAL_TABLET | Freq: Two times a day (BID) | ORAL | Status: DC
  Filled 2020-09-10 (×2): qty 1

## 2020-09-10 MED ORDER — ASPIRIN 300 MG RE SUPP
325.0000 mg | Freq: Once | RECTAL | Status: AC
  Administered 2020-09-10: 300 mg via RECTAL
  Filled 2020-09-10: qty 1

## 2020-09-10 NOTE — Progress Notes (Signed)
STROKE TEAM PROGRESS NOTE   INTERVAL HISTORY His wife is at the bedside.   Patient presented with confusion, right-sided weakness and facial droop and was found to have right MCA M2 branch occlusion for which he underwent mechanical thrombectomy and placement of rescue right ICA stent with balloon angioplasty.  Postprocedure/final CT showed subarachnoid hemorrhage/dying the right sylvian fissure.  Last night he had neurological worsening with increased left hemiparesis and underwent a repeat stat CT scan of the head which showed increased subarachnoid hemorrhage/contrast in the right perisylvian fissure with intermittent pulmonary embolus in the infarct territory on intraventricular extension midline shift.  Patient neurological exam looks better than his CT scan with mild left hemiparesis and neglect and field cut. MRI scan is pending this morning.  Blood pressure adequately controlled Vitals:   09/10/20 1030 09/10/20 1051 09/10/20 1134 09/10/20 1155  BP: (!) 116/51 138/60 (!) 120/59 (!) 125/54  Pulse: 74  84 84  Resp: 15     Temp:      TempSrc:      SpO2: 97%  95% 98%  Weight:      Height:       CBC:  Recent Labs  Lab 09/09/20 1101 09/09/20 1103 09/10/20 0606  WBC 6.2  --  8.2  NEUTROABS 3.9  --  6.7  HGB 11.2* 12.2* 9.6*  HCT 34.8* 36.0* 28.9*  MCV 84.1  --  82.6  PLT 124*  --  774*   Basic Metabolic Panel:  Recent Labs  Lab 09/09/20 1101 09/09/20 1103 09/10/20 0606  NA 133* 136 136  K 4.7 4.9 4.6  CL 100 100 106  CO2 24  --  23  GLUCOSE 163* 158* 182*  BUN 35* 47* 34*  CREATININE 1.90* 1.80* 1.98*  CALCIUM 8.8*  --  8.2*   Lipid Panel:  Recent Labs  Lab 09/10/20 0606  CHOL 107  TRIG 210*  HDL 36*  CHOLHDL 3.0  VLDL 42*  LDLCALC 29   HgbA1c:  Recent Labs  Lab 09/10/20 0606  HGBA1C 7.8*   Urine Drug Screen: pending  Result Date 09/10/2020 Mri brain/ MRA head 1. Large acute/subacute left-sided infarct with parenchymal hemorrhage measuring over 4 cm.  PH2 hemorrhage. 2. Element of acute right subarachnoid hemorrhage. 3. Chronic bilateral subdural hematomas. 4. No significant midline shift. IMAGING past 24 hours CT HEAD WO CONTRAST  Result Date: 09/10/2020 IMPRESSION:  1. Increased subarachnoid hemorrhage/contrast around the right sylvian fissure. There is also some interval parenchymal hemorrhage in the infarct territory with small volume intraventricular extension. No hydrocephalus or midline shift.  2. Right perisylvian cortical infarct correlating with affected vessel and the perfusion maps.  3. Chronic subdural hematomas.    DG Abd Portable 1V  Result Date: 09/09/2020 Nasogastric tube not identified within the visualized distal esophagus and abdomen. Chest radiograph may be helpful to identify the location of a malpositioned catheter.    Result Date: 09/09/2020 ECHOCARDIOGRAM REPORT   1. Left ventricular ejection fraction, by estimation, is 65 to 70%. The left ventricle has normal function. The left ventricle has no regional wall motion abnormalities. There is severe concentric left ventricular hypertrophy. Left ventricular diastolic  parameters are consistent with Grade I diastolic dysfunction (impaired relaxation). Elevated left atrial pressure.   2. Right ventricular systolic function is normal. The right ventricular size is mildly enlarged. There is normal pulmonary artery systolic pressure.   3. The mitral valve is normal in structure. No evidence of mitral valve regurgitation. Mild mitral stenosis. The mean  mitral valve gradient is 3.0 mmHg. Moderate mitral annular calcification.   4. The aortic valve is normal in structure. There is severe calcifcation of the aortic valve. There is severe thickening of the aortic valve. Aortic valve regurgitation is not visualized. Mild aortic valve stenosis. Aortic valve mean gradient measures 9.0 mmHg.   5. The inferior vena cava is normal in size with greater than 50% respiratory variability,  suggesting right atrial pressure of 3 mmHg.    CT ANGIO Head/neck CT perfusion CODE STROKE  Result Date: 09/09/2020  IMPRESSION:  1. Positive for emergent occlusion of a posterior Right MCA distal M2 branch at its bifurcation. Embolized calcification there is new since last year (series 12, image 14).  2. CT Perfusion detects a small infarct core of 13 mL with regional penumbra of 41 mL (28 mL mismatch).  3. Superimposed very bulky calcified plaque at the Right ICA origin with tandem HIGH-GRADE STENOSIS, including Radiographic String Sign (series 7, image 216).  4. 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  5. Bulky Left Carotid bifurcation and proximal left ICA calcified plaque without significant stenosis.  6. Up to moderate Right Vertebral Artery origin stenosis.     PHYSICAL EXAM Obese elderly Caucasian male not in distress. . Afebrile. Head is nontraumatic. Neck is supple without bruit.    Cardiac exam no murmur or gallop. Lungs are clear to auscultation. Distal pulses are well felt. Neurological Exam : Is awake alert oriented to time place and person.  Speech is clear without dysarthria or aphasia.  He has right gaze preference but able to look to the left of midline.  He blinks to threat on the right but not on the left.  Mild left lower facial weakness.  Tongue midline.  Motor system exam shows left hemiparesis with 3/5 strength and able to lift arm and leg off the bed unable to sustain the response..  Sensation is diminished in the left compared to the right .mild left hemineglect  ASSESSMENT/PLAN Mr. Italo Banton is a 80 y.o. 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  Was found to have a right MCA M2 branch occlusion.  .   Stroke  secondary to occluded right MCA due to calcified thrombus s/p IR revascularization of occluded dominant right MCA inferior division distal M2/3 with placement of stent and Right ICA balloon angioplasty  Code Stroke CT head No acute demarcated cortical infarct is identified.  ASPECTS is 10.    CTA head cclusion of a posterior Right MCA distal M2 branch at its bifurcation.  CTA neck right ICA origin with tandem HIGH-GRADE STENOSIS  CT perfusion small infarct core of 13 mL with regional penumbra of 41 mL (28 mL mismatch).   MRI/MRA h: Large acute/subacute left-sided infarct with parenchymal       hemorrhage measuring over 4 cm. PH2 hemorrhage.  Element of acute right subarachnoid hemorrhage.  Chronic bilateral subdural hematomas.  2D Echo EF 65-70%, severe LVH, Grade I diastolic dysfunction  LDL 29  HgbA1c 7.8  VTE prophylaxis - SCDs only, holding chemical DVT prophylaxis for now due to [facility] NPO: speech consulted  No antithrombotic prior to admission, now on aspirin 81 mg daily and ticagrelor 45mg  daily (reduced secondary to [facility])  Therapy recommendations:  PT:  CIR  Disposition:  Pending  Subarachnoid Hemorrhage  S/p Thrombectomy, stent placement distal M2, balloon angioplasty  CT head: Increased subarachnoid hemorrhage the right sylvian fissure. parenchymal hemorrhage in the infarct territory with small volume intraventricular extension.   SBP goal 120-140  Normal saline 149ml/hr  Seizure history  Previously on Keppra due to seizures but eventually weaned off  Will add Keppra 750mg  bid for seizure prophylaxis in the setting of SAH and SDH  Hypertension  Home meds:  Amlodipine 10mg  daily, Cadura 2mg  at bedtime, Micardis 20mg  daily  Unstable, requiring Cleviprex infusion, wean today  SBP goal 120-140 in the setting of SAH, ICH . Start hydralazine 20mg  q6 hours . Long-term BP goal normotensive  Hyperlipidemia  Home meds: atorvastatin 20 daily  LDL 29, goal <  70  Started on atorvastatin daily as he is daily   Continue statin at discharge  Diabetes type II Uncontrolled  Home meds:  Glipizide 10mg  daily, glargine 10units daily  HgbA1c 7.8, goal < 7.0  CBGs Recent Labs    09/09/20 1057 09/10/20 0916  GLUCAP 153* 171*      SSI  Chronic Kidney Injury III  Likely due to contrast load for imaging/IR and dehydration  Creatinine level 1.98  Adequate uop  Normal saline bolus 534ml  NS 132ml/hr  Follow uop closely  Other Stroke Risk Factors  Advanced Age >/= 9   Obesity, Body mass index is 32.23 kg/m., BMI >/= 30 associated with increased stroke risk, recommend weight loss, diet and exercise as appropriate   Coronary artery disease AV block, Mobitz 2, pacer  Obstructive sleep apnea, unclear if he is on CPAP at home  Other Active Problems  Anxiety/depression: prozac  GERD: protonix  Anemia: iron deficient and B12 deficient  Hospital day # 1  I have personally obtained history,examined this patient, reviewed notes, independently viewed imaging studies, participated in medical decision making and plan of care.ROS completed by me personally and pertinent positives fully documented  I have made any additions or clarifications directly to the above note. Agree with note above.  Patient presented with right calf M2 occlusion and underwent emergent mechanical thrombectomy with sclerotherapy with angioplasty and stenting.  Follow-up imaging shows subarachnoid hemorrhage and multiple normal hematoma hence we need to be cautious about his dual antiplatelet therapy.  Discussed case with Dr. Estanislado Pandy.  Recommend reducing dose of Brilinta at 45 mg twice daily and continue aspirin 81 mg daily as he is at risk for acute stent occlusion if antiplatelet therapy stopped.  A long discussion about this with the patient wife at the bedside risk-benefit of anticoagulation in the setting of subarachnoid hemorrhage and acute stent and answered  questions.  Continue strict blood pressure control with systolic goal [phone] for 24 hours and below 160.  Close neurological monitoring.  Speech therapy consult for swallow eval.  Discussed with Dr. Estanislado Pandy. This patient is critically ill and at significant risk of neurological worsening, death and care requires constant monitoring of vital signs, hemodynamics,respiratory and cardiac monitoring, extensive review of multiple databases, frequent neurological assessment, discussion with family, other specialists and medical decision making of high complexity.I have made any additions or clarifications directly to the above note.This critical care time does not reflect procedure time, or teaching time or supervisory time of PA/NP/Med Resident etc but could involve care discussion time.  I spent 30 minutes of neurocritical care time  in the care of  this patient.      Antony Contras, MD Medical Director [facility] Pager: [phone] 09/10/2020 3:02 PM   To contact Stroke Continuity provider, please refer to [email or web address] After hours, contact General Neurology

## 2020-09-10 NOTE — Progress Notes (Signed)
Patient refusing NG tube placement at this time. Dr. Theda Sers updated. Awaiting any new orders, will continue to monitor.

## 2020-09-10 NOTE — Progress Notes (Addendum)
Brief Neurology Progress Note  Patient's left extremities weaker and patient taken down for STAT CT head.  CT head showed increased subarachnoid hemorrhage/contrast in right perisylvian fissure with interval parenchymal hemorrhage in the infarct territory with small volume intraventricular extension without midline shift. The infarction seems to have completed.  Patient's blood pressure spiking above 150s despite clevidipine at max, and ordered labetalol 10mg  q2hrs. Recommended holding antiplatelet medications for now. Though there seems to be contrast extravasation there is clear increase in [facility] as compared to previous scan increasing the epileptogenicity and will therefore start levetiracetam 750mg  IV two times daily.   This patient is critically ill and at significant risk of neurological worsening, death and care requires constant monitoring of vital signs, hemodynamics,respiratory and cardiac monitoring, neurological assessment, discussion with family, other specialists and medical decision making of high complexity. I spent 30 minutes of neurocritical care time  in the care of  this patient. This was time spent independent of any time provided by nurse practitioner or PA.   Lynnae Sandhoff, MD Page: [phone]

## 2020-09-10 NOTE — Progress Notes (Signed)
Referring Physician(s): Code stroke- [name] (neurology)  Supervising Physician: Luanne Bras  Patient Status:  [facility] - In-pt  Chief Complaint: Stroke F/U  Subjective:  History of acute CVA s/p cerebral arteriogram with emergent mechanical thrombectomy and stent placement of right MCA inferior division M2/M3 occlusion, along with angioplasty of right ICA stenosis, achieving a TICI 3 revascularization via right femoral approach 09/09/2020 by Dr. Estanislado Pandy. Patient awake and alert laying in bed with no complaints at this time. Had extension of [place] per RN. He follows simple commands. Can spontaneously move all extremities with weakness of left side (dorsiflexion, LUE pronator drift). Right femoral puncture site c/d/i.  CT head this AM: 1. Increased subarachnoid hemorrhage/contrast around the right sylvian fissure. There is also some interval parenchymal hemorrhage in the infarct territory with small volume intraventricular extension. No hydrocephalus or midline shift. 2. Right perisylvian cortical infarct correlating with affected vessel and the perfusion maps. 3. Chronic subdural hematomas.   Allergies: Lisinopril, Invokana [canagliflozin], Penicillins, and Sulfamethoxazole  Medications: Prior to Admission medications   Medication Sig Start Date End Date Taking? Authorizing Provider  acetaminophen (TYLENOL) 325 MG tablet Take 1-2 tablets (325-650 mg total) by mouth every 4 (four) hours as needed for mild pain. 05/15/20  Yes Shirley Friar, PA-C  amLODipine (NORVASC) 10 MG tablet Take 10 mg by mouth at bedtime.   Yes [provider]  atorvastatin (LIPITOR) 20 MG tablet TAKE ONE-HALF TABLET BY  MOUTH DAILY Patient taking differently: Take 10 mg by mouth daily. 06/06/20  Yes Janith Lima, MD  azelastine (ASTELIN) 0.1 % nasal spray Place 1 spray into both nostrils 2 (two) times daily. Use in each nostril as directed 11/27/19  Yes Janith Lima,  MD  Carbonyl Iron 25 MG TABS Take 25 mg by mouth daily with breakfast.    Yes [provider]  doxazosin (CARDURA) 4 MG tablet TAKE ONE-HALF TABLET BY  MOUTH AT BEDTIME Patient taking differently: Take 2 mg by mouth at bedtime. 06/06/20  Yes Janith Lima, MD  FLUoxetine (PROZAC) 20 MG capsule Take 1 capsule (20 mg total) by mouth daily. 04/10/20  Yes Janith Lima, MD  glipiZIDE (GLUCOTROL XL) 10 MG 24 hr tablet Take 1 tablet (10 mg total) by mouth daily. 04/10/20  Yes Janith Lima, MD  Glucagon (GVOKE HYPOPEN 2-PACK) 1 MG/0.2ML SOAJ Inject 1 Act into the skin daily as needed. Patient taking differently: Inject 1 each into the skin as needed (low blood sugar). 04/30/20  Yes Janith Lima, MD  insulin glargine, 2 Unit Dial, (TOUJEO MAX SOLOSTAR) 300 UNIT/ML Solostar Pen Inject 10 Units into the skin daily. 04/16/20  Yes Janith Lima, MD  Insulin Pen Needle 32G X 6 MM MISC 1 Act by Does not apply route daily. 04/08/20  Yes Janith Lima, MD  levocetirizine (XYZAL) 5 MG tablet Take 1 tablet (5 mg total) by mouth every evening. 11/27/19  Yes Janith Lima, MD  mupirocin ointment (BACTROBAN) 2 % Apply 1 application topically 2 (two) times daily. 05/31/20  Yes Marrian Salvage, FNP  pantoprazole (PROTONIX) 40 MG tablet TAKE 1 TABLET BY MOUTH  DAILY Patient taking differently: Take 40 mg by mouth daily. 04/30/20  Yes Janith Lima, MD  PRESCRIPTION MEDICATION CPAP- At bedtime   Yes [provider]  psyllium (METAMUCIL) 58.6 % powder Take 1 packet by mouth daily as needed (for constipation- MIX AND DRINK).    Yes [provider]  solifenacin (VESICARE) 10 MG tablet TAKE 1 TABLET BY MOUTH  DAILY Patient taking differently: Take 10 mg by mouth daily. 08/09/20  Yes Janith Lima, MD  telmisartan (MICARDIS) 20 MG tablet TAKE 1 TABLET BY MOUTH  DAILY Patient taking differently: Take 20 mg by mouth daily. 09/07/20  Yes Janith Lima, MD  triamcinolone cream  (KENALOG) 0.1 % Apply 1 application topically daily as needed (to affected areas).  07/26/19  Yes [provider]     Vital Signs: BP 100/82   Pulse 83   Temp 98.5 F (36.9 C) (Oral)   Resp (!) 23   Ht 5\' 9"  (1.753 m)   Wt 218 lb 4.1 oz (99 kg)   SpO2 100%   BMI 32.23 kg/m   Physical Exam Vitals and nursing note reviewed.  Constitutional:      General: He is not in acute distress. Pulmonary:     Effort: Pulmonary effort is normal. No respiratory distress.  Skin:    General: Skin is warm and dry.     Comments: Right femoral puncture site soft without active bleeding or hematoma.  Neurological:     Mental Status: He is alert.     Comments: Alert, awake, and oriented x3. Speech and comprehension intact. PERRL bilaterally. Tongue midline. Can spontaneously move all extremities with weakness of left side (can lift LUE but cannot hold, weak LLE dorsiflexion). LUE pronator drift. Distal pulses (DPs) 1+ bilaterally.     Imaging: CT HEAD WO CONTRAST  Result Date: 09/10/2020 CLINICAL DATA:  Acute stroke suspected EXAM: CT HEAD WITHOUT CONTRAST TECHNIQUE: Contiguous axial images were obtained from the base of the skull through the vertex without intravenous contrast. COMPARISON:  Flat panel CT 09/09/2020 FINDINGS: Brain: Subarachnoid hemorrhage/contrast has increased/diffused along the right sylvian fissure and adjacent sulci. Visible cytotoxic edema in the right perisylvian cortex. There is a degree of parenchymal hemorrhage at the posterior caudate and deep white matter on the right, reaching the ependymoma with small volume intraventricular extension at the dependent lateral ventricles. No hydrocephalus. Bilateral chronic subdural hematoma with similar degree of wispy and nodular high-density posteriorly. Vascular: Right MCA branch stent. Skull: Prior burr holes on left-sided craniotomy. Sinuses/Orbits: No acute finding.  Bilateral cataract resection. Other: A call has been  placed to the ordering provider. IMPRESSION: 1. Increased subarachnoid hemorrhage/contrast around the right sylvian fissure. There is also some interval parenchymal hemorrhage in the infarct territory with small volume intraventricular extension. No hydrocephalus or midline shift. 2. Right perisylvian cortical infarct correlating with affected vessel and the perfusion maps. 3. Chronic subdural hematomas. Electronically Signed   By: Monte Fantasia M.D.   On: 09/10/2020 06:01   CT CEREBRAL PERFUSION W CONTRAST  Result Date: 09/09/2020 CLINICAL DATA:  80 year old male code stroke. Neglect. Right parietal lobe implicated. History of unresolved subdural hematomas. EXAM: CT ANGIOGRAPHY HEAD AND NECK CT PERFUSION BRAIN TECHNIQUE: Multidetector CT imaging of the head and neck was performed using the standard protocol during bolus administration of intravenous contrast. Multiplanar CT image reconstructions and MIPs were obtained to evaluate the vascular anatomy. Carotid stenosis measurements (when applicable) are obtained utilizing NASCET criteria, using the distal internal carotid diameter as the denominator. Multiphase CT imaging of the brain was performed following IV bolus contrast injection. Subsequent parametric perfusion maps were calculated using RAPID software. CONTRAST:  144mL OMNIPAQUE IOHEXOL 350 MG/ML SOLN COMPARISON:  Plain head CT 1105 hours today reported separately. Intracranial MRA 06/24/2017. FINDINGS: CT Brain Perfusion Findings: ASPECTS: 10 CBF (<30%)  Volume: 50mL Perfusion (Tmax>6.0s) volume: 75mL.  Hypoperfusion index 0.1 Mismatch Volume: 91mL Infarction Location:Posterior right MCA territory. CTA NECK Skeleton: Previous left superior frontal craniotomy. Chronic bilateral calvarium burr holes. No acute osseous abnormality identified. Cervical spine degeneration. Upper chest: Partially visible left chest cardiac pacemaker. Otherwise negative. Other neck: Mild motion artifact. No acute findings  identified in the neck. Aortic arch: 3 vessel arch configuration. Relatively mild Calcified aortic atherosclerosis. Right carotid system: Mild calcified plaque of the brachiocephalic artery without stenosis. Normal right CCA origin. Intermittent moderate calcified plaque in the right CCA proximal to the bifurcation, and at the bifurcation a radiographic string sign occurs at the ICA origin related to very bulky calcified plaque on series 5, image 108. Additional bulky plaque through the distal bulb, tandem stenosis on series 5, image 103. But the vessel remains patent. No additional stenosis to the skull base. Left carotid system: Mild left CCA origin but similar moderate left CCA and left carotid bifurcation through ICA bulb calcified plaque. On this side subsequent stenosis is less than 50 % with respect to the distal vessel. Vertebral arteries: Mild to moderate proximal right subclavian calcified plaque. Calcified plaque at the right vertebral artery origin with mild to moderate stenosis. Patent right vertebral then to the skull base. Mild proximal left subclavian and left vertebral artery origin calcified plaque without stenosis. Tortuous left V1 segment. Codominant left vertebral is otherwise normal to the skull base. CTA HEAD Posterior circulation: Negative distal vertebral arteries and basilar. The left V4 is mildly dominant. Normal PICA origins. Patent SCA and PCA origins. Small posterior communicating arteries. Mild left PCA P1 irregularity. Bilateral PCA branches are within normal limits. Anterior circulation: Both ICA siphons are patent. On the left cavernous segment calcified plaque results in no stenosis. Normal left ophthalmic and posterior communicating artery origins. On the right cavernous segment calcified plaque results in no significant stenosis. Patent carotid termini, MCA and ACA origins. Dominant left A1. 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 Otherwise the bilateral ACA branches are within normal limits. Left MCA M1 segment and bifurcation are patent. Left MCA branches appears table since 2018. Right MCA M1 and bifurcation remain patent. Dominant posterior right MCA M2 branch is occluded at the level of punctate calcification on series 12, image 14. This is at a bifurcation where the superior branch remains patent. But the parent vessel is occluded. This calcification is new compared to last year. Venous sinuses: Patent. Anatomic variants: Dominant left ACA A1. Review of the MIP images confirms the above findings IMPRESSION: 1. Positive for emergent occlusion of a posterior Right MCA distal M2 branch at its bifurcation. Embolized calcification there is new since last year (series 12, image 14). 2. CT Perfusion detects a small infarct core of 13 mL with regional penumbra of 41 mL (28 mL mismatch). 3. Superimposed very bulky calcified plaque at the Right ICA origin with tandem HIGH-GRADE STENOSIS, including Radiographic String Sign (series 7, image 216). 4. The above were reviewed in person with Dr. Roland Rack on 09/09/2020 at 1128 hours. 5. 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 6. Bulky Left Carotid bifurcation and proximal left ICA calcified plaque without significant stenosis. 7. Up to moderate Right Vertebral Artery origin stenosis. Electronically Signed   By: Genevie Ann M.D.   On: 09/09/2020 11:46   DG Abd Portable 1V  Result Date: 09/09/2020 CLINICAL DATA:  Nasogastric tube placement EXAM:  PORTABLE ABDOMEN - 1 VIEW COMPARISON:  None. FINDINGS: No nasogastric tube is identified within the visualized abdomen. The right flank and pelvis are excluded from view. The visualized abdominal gas pattern is unremarkable. IMPRESSION: Nasogastric tube not identified within the visualized distal esophagus and abdomen. Chest radiograph may be helpful to identify the location of a  malpositioned catheter. These results will be called to the ordering clinician or representative by the Radiologist Assistant, and communication documented in the PACS or [employer]. Electronically Signed   By: Fidela Salisbury MD   On: 09/09/2020 23:49   ECHOCARDIOGRAM COMPLETE  Result Date: 09/09/2020    ECHOCARDIOGRAM REPORT   Patient Name:   Frank [name] Date of Exam: 09/09/2020 Medical Rec #:  [id number]             Height:       69.0 in Accession #:    [id number]            Weight:       218.3 lb Date of Birth:  12/08/1940             BSA:          2.144 m Patient Age:    80 years              BP:           100/45 mmHg Patient Gender: M                     HR:           70 bpm. Exam Location:  Inpatient Procedure: 2D Echo, Cardiac Doppler, Color Doppler and Intracardiac            Opacification Agent Indications:    Stroke  History:        Patient has prior history of Echocardiogram examinations, most                 recent 06/24/2017. Risk Factors:Sleep Apnea, Diabetes and Former                 Smoker. GERD.  Sonographer:    Clayton Lefort RDCS (AE) Referring Phys: [phone] [facility]  Sonographer Comments: Technically difficult study due to poor echo windows. Image acquisition challenging due to respiratory motion. IMPRESSIONS  1. Left ventricular ejection fraction, by estimation, is 65 to 70%. The left ventricle has normal function. The left ventricle has no regional wall motion abnormalities. There is severe concentric left ventricular hypertrophy. Left ventricular diastolic  parameters are consistent with Grade I diastolic dysfunction (impaired relaxation). Elevated left atrial pressure.  2. Right ventricular systolic function is normal. The right ventricular size is mildly enlarged. There is normal pulmonary artery systolic pressure.  3. The mitral valve is normal in structure. No evidence of mitral valve regurgitation. Mild mitral stenosis. The mean mitral valve gradient is 3.0 mmHg.  Moderate mitral annular calcification.  4. The aortic valve is normal in structure. There is severe calcifcation of the aortic valve. There is severe thickening of the aortic valve. Aortic valve regurgitation is not visualized. Mild aortic valve stenosis. Aortic valve mean gradient measures 9.0 mmHg.  5. The inferior vena cava is normal in size with greater than 50% respiratory variability, suggesting right atrial pressure of 3 mmHg. FINDINGS  Left Ventricle: Left ventricular ejection fraction, by estimation, is 65 to 70%. The left ventricle has normal function. The left ventricle has no regional wall motion abnormalities. Definity contrast agent  was given IV to delineate the left ventricular  endocardial borders. The left ventricular internal cavity size was normal in size. There is severe concentric left ventricular hypertrophy. Left ventricular diastolic parameters are consistent with Grade I diastolic dysfunction (impaired relaxation). Elevated left atrial pressure. Right Ventricle: The right ventricular size is mildly enlarged. No increase in right ventricular wall thickness. Right ventricular systolic function is normal. There is normal pulmonary artery systolic pressure. Left Atrium: Left atrial size was normal in size. Right Atrium: Right atrial size was normal in size. Pericardium: There is no evidence of pericardial effusion. Mitral Valve: The mitral valve is normal in structure. There is moderate thickening of the mitral valve leaflet(s). There is moderate calcification of the mitral valve leaflet(s). Moderate mitral annular calcification. No evidence of mitral valve regurgitation. Mild mitral valve stenosis. MV peak gradient, 10.9 mmHg. The mean mitral valve gradient is 3.0 mmHg. Tricuspid Valve: The tricuspid valve is normal in structure. Tricuspid valve regurgitation is mild . No evidence of tricuspid stenosis. Aortic Valve: The aortic valve is normal in structure. There is severe calcifcation of the  aortic valve. There is severe thickening of the aortic valve. Aortic valve regurgitation is not visualized. Mild aortic stenosis is present. Aortic valve mean gradient measures 9.0 mmHg. Aortic valve peak gradient measures 15.7 mmHg. Aortic valve area, by VTI measures 1.81 cm. Pulmonic Valve: The pulmonic valve was normal in structure. Pulmonic valve regurgitation is not visualized. No evidence of pulmonic stenosis. Aorta: The aortic root is normal in size and structure. Venous: The inferior vena cava is normal in size with greater than 50% respiratory variability, suggesting right atrial pressure of 3 mmHg. IAS/Shunts: No atrial level shunt detected by color flow Doppler.  LEFT VENTRICLE PLAX 2D LVIDd:         4.70 cm  Diastology LVIDs:         3.30 cm  LV e' medial:    6.42 cm/s LV PW:         1.80 cm  LV E/e' medial:  15.5 LV IVS:        1.60 cm  LV e' lateral:   6.64 cm/s LVOT diam:     2.00 cm  LV E/e' lateral: 15.0 LV SV:         71 LV SV Index:   33 LVOT Area:     3.14 cm  RIGHT VENTRICLE             IVC RV Basal diam:  3.50 cm     IVC diam: 1.60 cm RV Mid diam:    3.90 cm RV S prime:     11.70 cm/s TAPSE (M-mode): 2.4 cm LEFT ATRIUM             Index       RIGHT ATRIUM           Index LA diam:        3.60 cm 1.68 cm/m  RA Area:     17.70 cm LA Vol (A2C):   59.5 ml 27.75 ml/m RA Volume:   43.70 ml  20.38 ml/m LA Vol (A4C):   53.2 ml 24.81 ml/m LA Biplane Vol: 61.9 ml 28.87 ml/m  AORTIC VALVE AV Area (Vmax):    1.69 cm AV Area (Vmean):   1.83 cm AV Area (VTI):     1.81 cm AV Vmax:           198.33 cm/s AV Vmean:  137.000 cm/s AV VTI:            0.390 m AV Peak Grad:      15.7 mmHg AV Mean Grad:      9.0 mmHg LVOT Vmax:         107.00 cm/s LVOT Vmean:        79.800 cm/s LVOT VTI:          0.225 m LVOT/AV VTI ratio: 0.58  AORTA Ao Root diam: 3.20 cm Ao Asc diam:  3.20 cm MITRAL VALVE MV Area (PHT): 1.89 cm     SHUNTS MV Area VTI:   1.89 cm     Systemic VTI:  0.22 m MV Peak grad:  10.9 mmHg     Systemic Diam: 2.00 cm MV Mean grad:  3.0 mmHg MV Vmax:       1.65 m/s MV Vmean:      78.7 cm/s MV Decel Time: 401 msec MV E velocity: 99.50 cm/s MV A velocity: 175.00 cm/s MV E/A ratio:  0.57 Ena Dawley MD Electronically signed by Ena Dawley MD Signature Date/Time: 09/09/2020/10:39:50 PM    Final    CT HEAD CODE STROKE WO CONTRAST  Result Date: 09/09/2020 CLINICAL DATA:  Code stroke. Neuro deficit, acute, stroke suspected. Additional history provided: Right-sided facial droop, last known well 9:15 a.m. EXAM: CT HEAD WITHOUT CONTRAST TECHNIQUE: Contiguous axial images were obtained from the base of the skull through the vertex without intravenous contrast. COMPARISON:  Head CT 02/08/2020. FINDINGS: Brain: Mild cerebral atrophy. Redemonstrated subdural hematomas overlying the bilateral cerebral convexities. These are unchanged in size as compared to the prior head CT of 02/08/2020 again measuring up to 9 mm in thickness bilaterally. Although intermediate to low-density, the density of the right subdural hematoma has subtly increased as compared to the prior examination which may reflect some degree of interval hemorrhage into the collection. 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 Scattered small foci of calcification within both collections. Symmetric mass effect upon the underlying cerebral hemispheres without midline shift. Ill-defined hypoattenuation within the cerebral white matter is nonspecific, but compatible with chronic small vessel ischemic disease. No acute demarcated cortical infarct. No evidence of intracranial mass. Vascular: No hyperdense vessel.  Atherosclerotic calcifications. Skull: No calvarial fracture.  Left-sided cranioplasty. Sinuses/Orbits: Partial opacification of left ethmoid air cells. Background mild bilateral ethmoid and sphenoid sinus mucosal thickening. ASPECTS ([place] Stroke Program Early CT Score) - Ganglionic level infarction  (caudate, lentiform nuclei, internal capsule, insula, M1-M3 cortex): 7 - Supraganglionic infarction (M4-M6 cortex): 3 Total score (0-10 with 10 being normal): 10 Dr. Kathrynn Speed aware of these findings at 11:20 a.m. on 09/09/2020 (discussed with Dr. Lars Pinks). IMPRESSION: Redemonstrated subdural hematomas overlying the bilateral cerebral convexities. As before, these hematomas measure up to 9 mm in greatest thickness. Although intermediate to low-density, the density of the right subdural hematoma has subtly increased from the examination of 02/08/2020, suggestive of some degree of interval hemorrhage into the collection. The left subdural hematoma remains low-density. Unchanged symmetric mass effect upon the cerebral hemispheres without midline shift. No acute demarcated cortical infarct is identified.  ASPECTS is 10. Stable generalized atrophy of the brain and chronic small vessel ischemic disease. Electronically Signed   By: Kellie Simmering DO   On: 09/09/2020 11:35   CT ANGIO HEAD CODE STROKE  Result Date: 09/09/2020 CLINICAL DATA:  80 year old male code stroke. Neglect. Right parietal lobe implicated. History of unresolved subdural hematomas. EXAM: CT ANGIOGRAPHY HEAD AND NECK CT  PERFUSION BRAIN TECHNIQUE: Multidetector CT imaging of the head and neck was performed using the standard protocol during bolus administration of intravenous contrast. Multiplanar CT image reconstructions and MIPs were obtained to evaluate the vascular anatomy. Carotid stenosis measurements (when applicable) are obtained utilizing NASCET criteria, using the distal internal carotid diameter as the denominator. Multiphase CT imaging of the brain was performed following IV bolus contrast injection. Subsequent parametric perfusion maps were calculated using RAPID software. CONTRAST:  120mL OMNIPAQUE IOHEXOL 350 MG/ML SOLN COMPARISON:  Plain head CT 1105 hours today reported separately. Intracranial MRA 06/24/2017. FINDINGS: CT  Brain Perfusion Findings: ASPECTS: 10 CBF (<30%) Volume: 51mL Perfusion (Tmax>6.0s) volume: 49mL.  Hypoperfusion index 0.1 Mismatch Volume: 43mL Infarction Location:Posterior right MCA territory. CTA NECK Skeleton: Previous left superior frontal craniotomy. Chronic bilateral calvarium burr holes. No acute osseous abnormality identified. Cervical spine degeneration. Upper chest: Partially visible left chest cardiac pacemaker. Otherwise negative. Other neck: Mild motion artifact. No acute findings identified in the neck. Aortic arch: 3 vessel arch configuration. Relatively mild Calcified aortic atherosclerosis. Right carotid system: Mild calcified plaque of the brachiocephalic artery without stenosis. Normal right CCA origin. Intermittent moderate calcified plaque in the right CCA proximal to the bifurcation, and at the bifurcation a radiographic string sign occurs at the ICA origin related to very bulky calcified plaque on series 5, image 108. Additional bulky plaque through the distal bulb, tandem stenosis on series 5, image 103. But the vessel remains patent. No additional stenosis to the skull base. Left carotid system: Mild left CCA origin but similar moderate left CCA and left carotid bifurcation through ICA bulb calcified plaque. On this side subsequent stenosis is less than 50 % with respect to the distal vessel. Vertebral arteries: Mild to moderate proximal right subclavian calcified plaque. Calcified plaque at the right vertebral artery origin with mild to moderate stenosis. Patent right vertebral then to the skull base. Mild proximal left subclavian and left vertebral artery origin calcified plaque without stenosis. Tortuous left V1 segment. Codominant left vertebral is otherwise normal to the skull base. CTA HEAD Posterior circulation: Negative distal vertebral arteries and basilar. The left V4 is mildly dominant. Normal PICA origins. Patent SCA and PCA origins. Small posterior communicating arteries. Mild  left PCA P1 irregularity. Bilateral PCA branches are within normal limits. Anterior circulation: Both ICA siphons are patent. On the left cavernous segment calcified plaque results in no stenosis. Normal left ophthalmic and posterior communicating artery origins. On the right cavernous segment calcified plaque results in no significant stenosis. Patent carotid termini, MCA and ACA origins. Dominant left A1. 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 Otherwise the bilateral ACA branches are within normal limits. Left MCA M1 segment and bifurcation are patent. Left MCA branches appears table since 2018. Right MCA M1 and bifurcation remain patent. Dominant posterior right MCA M2 branch is occluded at the level of punctate calcification on series 12, image 14. This is at a bifurcation where the superior branch remains patent. But the parent vessel is occluded. This calcification is new compared to last year. Venous sinuses: Patent. Anatomic variants: Dominant left ACA A1. Review of the MIP images confirms the above findings IMPRESSION: 1. Positive for emergent occlusion of a posterior Right MCA distal M2 branch at its bifurcation. Embolized calcification there is new since last year (series 12, image 14). 2. CT Perfusion detects a small infarct core of 13 mL with regional penumbra of 41 mL (  28 mL mismatch). 3. Superimposed very bulky calcified plaque at the Right ICA origin with tandem HIGH-GRADE STENOSIS, including Radiographic String Sign (series 7, image 216). 4. The above were reviewed in person with Dr. Roland Rack on 09/09/2020 at 1128 hours. 5. 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 6. Bulky Left Carotid bifurcation and proximal left ICA calcified plaque without significant stenosis. 7. Up to moderate Right Vertebral Artery origin stenosis. Electronically Signed   By: Genevie Ann M.D.   On:  09/09/2020 11:46   CT ANGIO NECK CODE STROKE  Result Date: 09/09/2020 CLINICAL DATA:  80 year old male code stroke. Neglect. Right parietal lobe implicated. History of unresolved subdural hematomas. EXAM: CT ANGIOGRAPHY HEAD AND NECK CT PERFUSION BRAIN TECHNIQUE: Multidetector CT imaging of the head and neck was performed using the standard protocol during bolus administration of intravenous contrast. Multiplanar CT image reconstructions and MIPs were obtained to evaluate the vascular anatomy. Carotid stenosis measurements (when applicable) are obtained utilizing NASCET criteria, using the distal internal carotid diameter as the denominator. Multiphase CT imaging of the brain was performed following IV bolus contrast injection. Subsequent parametric perfusion maps were calculated using RAPID software. CONTRAST:  156mL OMNIPAQUE IOHEXOL 350 MG/ML SOLN COMPARISON:  Plain head CT 1105 hours today reported separately. Intracranial MRA 06/24/2017. FINDINGS: CT Brain Perfusion Findings: ASPECTS: 10 CBF (<30%) Volume: 73mL Perfusion (Tmax>6.0s) volume: 34mL.  Hypoperfusion index 0.1 Mismatch Volume: 42mL Infarction Location:Posterior right MCA territory. CTA NECK Skeleton: Previous left superior frontal craniotomy. Chronic bilateral calvarium burr holes. No acute osseous abnormality identified. Cervical spine degeneration. Upper chest: Partially visible left chest cardiac pacemaker. Otherwise negative. Other neck: Mild motion artifact. No acute findings identified in the neck. Aortic arch: 3 vessel arch configuration. Relatively mild Calcified aortic atherosclerosis. Right carotid system: Mild calcified plaque of the brachiocephalic artery without stenosis. Normal right CCA origin. Intermittent moderate calcified plaque in the right CCA proximal to the bifurcation, and at the bifurcation a radiographic string sign occurs at the ICA origin related to very bulky calcified plaque on series 5, image 108. Additional bulky  plaque through the distal bulb, tandem stenosis on series 5, image 103. But the vessel remains patent. No additional stenosis to the skull base. Left carotid system: Mild left CCA origin but similar moderate left CCA and left carotid bifurcation through ICA bulb calcified plaque. On this side subsequent stenosis is less than 50 % with respect to the distal vessel. Vertebral arteries: Mild to moderate proximal right subclavian calcified plaque. Calcified plaque at the right vertebral artery origin with mild to moderate stenosis. Patent right vertebral then to the skull base. Mild proximal left subclavian and left vertebral artery origin calcified plaque without stenosis. Tortuous left V1 segment. Codominant left vertebral is otherwise normal to the skull base. CTA HEAD Posterior circulation: Negative distal vertebral arteries and basilar. The left V4 is mildly dominant. Normal PICA origins. Patent SCA and PCA origins. Small posterior communicating arteries. Mild left PCA P1 irregularity. Bilateral PCA branches are within normal limits. Anterior circulation: Both ICA siphons are patent. On the left cavernous segment calcified plaque results in no stenosis. Normal left ophthalmic and posterior communicating artery origins. On the right cavernous segment calcified plaque results in no significant stenosis. Patent carotid termini, MCA and ACA origins. Dominant left A1. 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 Otherwise the bilateral ACA branches are within normal limits. Left MCA M1 segment and  bifurcation are patent. Left MCA branches appears table since 2018. Right MCA M1 and bifurcation remain patent. Dominant posterior right MCA M2 branch is occluded at the level of punctate calcification on series 12, image 14. This is at a bifurcation where the superior branch remains patent. But the parent vessel is occluded. This calcification is new  compared to last year. Venous sinuses: Patent. Anatomic variants: Dominant left ACA A1. Review of the MIP images confirms the above findings IMPRESSION: 1. Positive for emergent occlusion of a posterior Right MCA distal M2 branch at its bifurcation. Embolized calcification there is new since last year (series 12, image 14). 2. CT Perfusion detects a small infarct core of 13 mL with regional penumbra of 41 mL (28 mL mismatch). 3. Superimposed very bulky calcified plaque at the Right ICA origin with tandem HIGH-GRADE STENOSIS, including Radiographic String Sign (series 7, image 216). 4. The above were reviewed in person with Dr. Roland Rack on 09/09/2020 at 1128 hours. 5. 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 6. Bulky Left Carotid bifurcation and proximal left ICA calcified plaque without significant stenosis. 7. Up to moderate Right Vertebral Artery origin stenosis. Electronically Signed   By: Genevie Ann M.D.   On: 09/09/2020 11:46    Labs:  CBC: Recent Labs    05/14/20 1234 08/21/20 0945 09/09/20 1101 09/09/20 1103 09/10/20 0606  WBC 7.3 5.8 6.2  --  8.2  HGB 10.4* 11.8* 11.2* 12.2* 9.6*  HCT 34.0* 36.4* 34.8* 36.0* 28.9*  PLT 125* 142.0* 124*  --  143*    COAGS: Recent Labs    09/09/20 1101  INR 1.0  APTT 32    BMP: Recent Labs    12/08/19 2250 12/12/19 1450 05/13/20 2130 05/14/20 1329 08/21/20 0945 09/09/20 1101 09/09/20 1103 09/10/20 0606  NA 138   < > 139 137 139 133* 136 136  K 4.6   < > 4.9 4.0 4.6 4.7 4.9 4.6  CL 102   < > 100 106 106 100 100 106  CO2 25   < > 26 24 29 24   --  23  GLUCOSE 136*   < > 203* 166* 97 163* 158* 182*  BUN 41*   < > 42* 36* 37* 35* 47* 34*  CALCIUM 8.9   < > 9.0 7.8* 9.3 8.8*  --  8.2*  CREATININE 2.30*   < > 1.78* 1.62* 1.74* 1.90* 1.80* 1.98*  GFRNONAA 26*  --  38* 42*  --  35*  --  33*  GFRAA 30*  --   --   --   --   --   --   --    < > = values in this interval not displayed.    LIVER  FUNCTION TESTS: Recent Labs    08/21/20 0945 09/09/20 1101  BILITOT 0.4 0.7  AST 15 22  ALT 18 22  ALKPHOS 73 69  PROT 6.6 6.4*  ALBUMIN 3.9 3.6    Assessment and Plan:  History of acute CVA s/p cerebral arteriogram with emergent mechanical thrombectomy and stent placement of right MCA inferior division M2/M3 occlusion, along with angioplasty of right ICA stenosis, achieving a TICI 3 revascularization via right femoral approach 09/09/2020 by Dr. Estanislado Pandy. Patient's condition stable- awake and alert, follows simple commands, can spontaneously move all extremities with weakness of left side, LUE pronator drift. Right femoral puncture site stable, distal pulses (DPs) 1+ bilaterally. Continue to hold DAPT secondary to increase of SAH. Plan  for MR/MRA brain/head today. Further plans per neurology- appreciate and agree with management. NIR to follow.   Electronically Signed: Earley Abide, PA-C 09/10/2020, 9:56 AM   I spent a total of 15 Minutes at the the patient's bedside AND on the patient's hospital floor or unit, greater than 50% of which was counseling/coordinating care for CVA s/p revascularization.

## 2020-09-10 NOTE — Progress Notes (Signed)
Patient brought down from 4N ICU with floor RN for MRI brain wo. Patient has medtronic device. [facility]link express sent to [place] [facility]- Cardiology [place]. Orders received for DOO 85. Will re-program post scan.

## 2020-09-10 NOTE — Discharge Instructions (Addendum)
Femoral Site Care This sheet gives you information about how to care for yourself after your procedure. Your health care provider may also give you more specific instructions. If you have problems or questions, contact your health care provider. What can I expect after the procedure? After the procedure, it is common to have:  Bruising that usually fades within 1-2 weeks.  Tenderness at the site. Follow these instructions at home: Wound care 1. Follow instructions from your health care provider about how to take care of your insertion site. Make sure you: ? Wash your hands with soap and water before you change your bandage (dressing). If soap and water are not available, use hand sanitizer. ? Change your dressing as directed- pressure dressing removed 24 hours post-procedure (and switch for bandaid), bandaid removed 72 hours post-procedure 2. Do not take baths, swim, or use a hot tub for 7 days post-procedure. 3. You may shower 48 hours after the procedure or as told by your health care provider. ? Gently wash the site with plain soap and water. ? Pat the area dry with a clean towel. ? Do not rub the site. This may cause bleeding. 4. Check your site every day for signs of infection. Check for: ? Redness, swelling, or pain. ? Fluid or blood. ? Warmth. ? Pus or a bad smell. Activity  Do not stoop, bend, or lift anything that is heavier than 10 lb (4.5 kg) for 2 weeks post-procedure.  Do not drive self for 2 weeks post-procedure. Contact a health care provider if you have:  A fever or chills.  You have redness, swelling, or pain around your insertion site. Get help right away if:  The catheter insertion area swells very fast.  You pass out.  You suddenly start to sweat or your skin gets clammy.  The catheter insertion area is bleeding, and the bleeding does not stop when you hold steady pressure on the area.  The area near or just beyond the catheter insertion site becomes  pale, cool, tingly, or numb. These symptoms may represent a serious problem that is an emergency. Do not wait to see if the symptoms will go away. Get medical help right away. Call your local emergency services (911 in the [place] Do not drive yourself to the hospital.  This information is not intended to replace advice given to you by your health care provider. Make sure you discuss any questions you have with your health care provider. Document Revised: 07/12/2017 Document Reviewed: 07/12/2017 Elsevier Patient Education  2020 [employer]. 

## 2020-09-10 NOTE — Progress Notes (Signed)
CT completed. Dr. Theda Sers paged.

## 2020-09-10 NOTE — Progress Notes (Signed)
Updated Dr. Theda Sers that patient's systolic blood pressure is above goal of 140 despite max rate on cleviprex drip. New orders received for PRN labetalol IV 10 mg every 2 hours.

## 2020-09-10 NOTE — Progress Notes (Signed)
Patient failed swallow eval this AM. Began to cough immediately, liquid pooling towards left side of mouth. SLP order placed by Dr. Leonie Man

## 2020-09-10 NOTE — Progress Notes (Addendum)
Patient noted to keep eyes closed while still appropriately answering orientation questions during assessments. Requiring frequent cueing from staff to keep eyes open. No other changes noted in neuro assessment. Dr. Leonie Man aware, no further orders.

## 2020-09-10 NOTE — Progress Notes (Signed)
1800 neuro exam- neuro change.  After cleaning patient in bed, patient became more drowsy, perseverating words, oriented to name, age, place, and situation, but not consistently answering month/year as prior. Patient now requiring cueing to stay awake. LEFT sided facial droop noted. Weakening LUE strength compared to ealier.  Dr. Leonel Ramsay aware, STAT head CT ordered.

## 2020-09-10 NOTE — Progress Notes (Signed)
Updated Dr. Theda Sers of Neurology that patient's left extremities are weaker than previous neuro exams and have drift. Verbal order for STAT head CT.

## 2020-09-10 NOTE — Evaluation (Signed)
Clinical/Bedside Swallow Evaluation Patient Details  Name: Frank [name] MRN: [id number] Date of Birth: April 07, 1941  Today's Date: 09/10/2020 Time: SLP Start Time (ACUTE ONLY): 2202 SLP Stop Time (ACUTE ONLY): 1426 SLP Time Calculation (min) (ACUTE ONLY): 11 min  Past Medical History:  Past Medical History:  Diagnosis Date  . Allergy    Rhinitis  . Anemia    NOS iron deficient and B12 deficient  . Arthritis   . 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 Diabetes mellitus    Type 2  . GERD (gastroesophageal reflux disease)   . Hyperlipidemia   . Hypertension   . 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 OSA (obstructive sleep apnea)    cpap  . TBI (traumatic brain injury) [facility])    Past Surgical History:  Past Surgical History:  Procedure Laterality Date  . APPENDECTOMY    . arm fracture Left 2011   with hardware  . BURR HOLE Bilateral 08/04/2017   Procedure: Haskell Flirt;  Surgeon: [name], MD;  Location: [place];  Service: Neurosurgery;  Laterality: Bilateral;  . COLONOSCOPY    . CRANIOTOMY Left 12/01/2017   Procedure: Trudee Kuster Holes CRANIOTOMY HEMATOMA EVACUATION SUBDURAL;  Surgeon: [name], MD;  Location: [place];  Service: Neurosurgery;  Laterality: Left;  . ESOPHAGOGASTRODUODENOSCOPY  2006   gastritis  . 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 RADIOLOGY WITH ANESTHESIA N/A 09/09/2020   Procedure: IR WITH ANESTHESIA;  Surgeon: Radiologist, Medication, MD;  Location: [place];  Service: Radiology;  Laterality: N/A;  . ROTATOR CUFF REPAIR    . SEPTOPLASTY  1959   Deviated Septum  . 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 MRI showed subarachnoid hemorrhage/contrast around the right sylvian fissure. There is also some interval parenchymal  hemorrhage in the infarct territory with small volume intraventricular extension. Per chart patient has a history of surgically managed subacute subdural hematomas May 2019. Other PMH: TBI (no specifics), OSA, GERD, DM.   Assessment / Plan / Recommendation Clinical Impression  Pt initially answering questions, kept eyes closed during oral care. Minimal manipulation of ice chip wtih 1-2 unremarkable swallows present. Puree was suctioned from oral cavity as he continuously fell asleep despite cues. He should remain NPO and ST will intervene next session for initiation of po's and/or instrumental testing. SLP Visit Diagnosis: Dysphagia, unspecified (R13.10)    Aspiration Risk  Moderate aspiration risk    Diet Recommendation NPO   Medication Administration: Via alternative means    Other  Recommendations Oral Care Recommendations: Oral care QID   Follow up Recommendations Skilled Nursing facility      Frequency and Duration min 2x/week  2 weeks       Prognosis Prognosis for Safe Diet Advancement:  (fair-good) Barriers to Reach Goals:  (prior strokes)      Swallow Study   General Date of Onset: 09/09/20 HPI: 80 yr old was at outpatient rehabilitation facility and had a sudden onset of a new right sided facial droop. MRI showed subarachnoid hemorrhage/contrast around the right sylvian fissure. There is also some interval parenchymal hemorrhage in the infarct territory with small volume intraventricular extension. Per chart patient has a history of surgically managed subacute subdural hematomas May 2019. Other PMH:  TBI (no specifics), OSA, GERD, DM. Type of Study: Bedside Swallow Evaluation Previous Swallow Assessment:  (none) Diet Prior to this Study: NPO Temperature Spikes Noted: No Respiratory Status: Nasal cannula History of Recent Intubation: No Behavior/Cognition: Lethargic/Drowsy;Requires cueing Oral Cavity Assessment: Dry Oral Care Completed by SLP: Yes Oral Cavity -  Dentition: Adequate natural dentition (missing some upper left) Vision: Functional for self-feeding Self-Feeding Abilities: Needs assist Patient Positioning: Upright in bed Baseline Vocal Quality: Low vocal intensity Volitional Cough:  (unable) Volitional Swallow: Unable to elicit    Oral/Motor/Sensory Function Overall Oral Motor/Sensory Function:  (no overt asymmetry- will assess further)   Ice Chips Ice chips: Impaired Oral Phase Impairments: Poor awareness of bolus Oral Phase Functional Implications: Oral holding Pharyngeal Phase Impairments: Suspected delayed Swallow   Thin Liquid Thin Liquid: Not tested    Nectar Thick Nectar Thick Liquid: Not tested   Honey Thick Honey Thick Liquid: Not tested   Puree Puree: Impaired Presentation: Spoon Oral Phase Impairments: Reduced lingual movement/coordination;Poor awareness of bolus Oral Phase Functional Implications:  (falling asleep-removed)   Solid     Solid: Not tested      Houston Siren 09/10/2020,4:23 PM  Orbie Pyo Colvin Caroli.Ed Risk analyst [phone] Office [phone]

## 2020-09-10 NOTE — Progress Notes (Addendum)
Patient off floor for 2 hours for MRI. Spoke with Lattie Haw SLP who will be to unit shortly for swallow eval.

## 2020-09-10 NOTE — Progress Notes (Addendum)
Patient with SBP's 150's despite max cleviprex and PRN labetalol and hydralazine. Dr. Leonie Man aware, give additional Labetalol 40mg  IV X1.  HR 81  Patient's SBP 150-155 post Labetalol 40mg . Dr. Leonie Man aware, ok with BP.

## 2020-09-10 NOTE — Progress Notes (Signed)
This RN attempted to place NG tube at 2320. Patient tolerated well. Follow up x-ray, however, showed tube to not be placed in the stomach. NG tube removed. Patient refusing another attempt to place one at this time. Dr Theda Sers updated. Per previous call with Dr. Estanislado Pandy, Dr. Estanislado Pandy wished me to speak with neuro hospitalist on call if patient did not tolerate NG tube placement. This RN will attempt to place another NG tube, if patient consents. If not, this RN will update neurologist on call.

## 2020-09-10 NOTE — Evaluation (Signed)
Physical Therapy Evaluation Patient Details Name: Frank [name] MRN: [id number] DOB: Dec 29, 1940 Today's Date: 09/10/2020   History of Present Illness  Patient is a 80 y/o male who presents with dizziness, confusion, right weakness and right facial droop. NIH:4. 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 Repeat Head CT 3/1- increased SAH in right perisylvian fissure and parenchymal hemorrhage in the infarct territory with small volume intraventricular extension. MRI pending. PMH includes TBI, SDH, CKD, OSA, neuropathy, HTN, DM, AV block s/p PPM.  Clinical Impression  Patient presents with left sided weakness, impaired balance, impaired cognition and impaired mobility s/p above. Pt lives with wife and reports being independent with ADLs and ambulation PTA. Has been going to OPPT twice weekly to work on balance. Also has a caregiver that comes 5 days/week to help as well. Today, pt requires Mod A for bed mobility, Min A of 2 for standing and Mod A of 2 for taking a few steps along side bed with left knee buckling. Deferred transfer to chair as pt going down for MRI. Pt with poor awareness of deficits/safety. Would benefit from CIR to maximize independence and mobility prior to return home. Wife would like to take pt home pending pt progresses with mobility and able to ambulate as she can get 24/7 assist if needed. Will follow acutely.    Follow Up Recommendations CIR;Supervision for mobility/OOB;Supervision/Assistance - 24 hour    Equipment Recommendations  None recommended by PT    Recommendations for Other Services Rehab consult     Precautions / Restrictions Precautions Precautions: Fall Restrictions Weight Bearing Restrictions: No      Mobility  Bed Mobility Overal bed mobility: Needs Assistance Bed Mobility: Supine to Sit;Sit to Supine     Supine to sit: Mod assist;HOB elevated Sit to  supine: Mod assist   General bed mobility comments: Step by step cues/assist to bring LEs to EOB, reach for rail with RUE and elevate trunk. Pt with left lateral lean once upright. Assist to bring LEs into bed to return to supine.    Transfers Overall transfer level: Needs assistance Equipment used: 2 person hand held assist Transfers: Sit to/from Stand Sit to Stand: Min assist;+2 safety/equipment         General transfer comment: ASsist of 2 to power to standing with left lateral lean. Left knee buckling with no awareness by patient.  Ambulation/Gait Ambulation/Gait assistance: Mod assist;+2 physical assistance Gait Distance (Feet): 3 Feet Assistive device: 2 person hand held assist Gait Pattern/deviations: Wide base of support;Shuffle Gait velocity: decreased   General Gait Details: Able to take a few steps along side bed with assist for weight shifting, balance and left knee stability due to multiple episodes of buckling on left.  Stairs            Wheelchair Mobility    Modified Rankin (Stroke Patients Only) Modified Rankin (Stroke Patients Only) Pre-Morbid Rankin Score: Moderate disability Modified Rankin: Moderately severe disability     Balance Overall balance assessment: Needs assistance Sitting-balance support: Feet supported;Single extremity supported Sitting balance-Leahy Scale: Poor Sitting balance - Comments: Requires Min-Mod A for sitting balance due to left lateral lean; able to self correct with cues but no sustain. Postural control: Left lateral lean Standing balance support: During functional activity Standing balance-Leahy Scale: Poor Standing balance comment: Requires external support from therapists; left knee instability/buckling.  Pertinent Vitals/Pain Pain Assessment: Faces Faces Pain Scale: No hurt    Home Living Family/patient expects to be discharged to:: Private residence Living Arrangements:  Spouse/significant other;Other (Comment) Available Help at Discharge: Family;Personal care attendant;Other (Comment) Type of Home: House Home Access: Stairs to enter Entrance Stairs-Rails: Can reach both Entrance Stairs-Number of Steps: 4 Home Layout: One level Home Equipment: Walker - 2 wheels;Shower seat;Grab bars - tub/shower;Grab bars - toilet;Cane - single point;Walker - 4 wheels;Wheelchair - manual      Prior Function Level of Independence: Independent with assistive device(s)         Comments: Does own ADLs. Has a caregiver coming 5 days/week from 8-4 pm. Just got rid of night time nurse recently. Going to OPPT twice/week for balance. Does not drive. Goes out to lunch with friends. Doing really well per wife.     Hand Dominance   Dominant Hand: Right    Extremity/Trunk Assessment   Upper Extremity Assessment Upper Extremity Assessment: Defer to OT evaluation    Lower Extremity Assessment Lower Extremity Assessment: Generalized weakness;LLE deficits/detail LLE Deficits / Details: Grossly ~3/5 knee extension, 2+/5 hip flexion, ankle DF/PF 4/5. LLE Sensation: decreased light touch       Communication   Communication: No difficulties  Cognition Arousal/Alertness: Lethargic Behavior During Therapy: WFL for tasks assessed/performed Overall Cognitive Status: Impaired/Different from baseline Area of Impairment: Orientation;Attention;Following commands;Safety/judgement;Awareness;Problem solving                 Orientation Level: Disoriented to;Situation;Time Current Attention Level: Sustained   Following Commands: Follows one step commands with increased time (repetition) Safety/Judgement: Decreased awareness of safety;Decreased awareness of deficits Awareness: Intellectual Problem Solving: Requires verbal cues;Requires tactile cues;Slow processing;Decreased initiation;Difficulty sequencing General Comments: Did not know why he was in the hospital but able to  recall later in session it was due to stroke after being told. Roughly oriented to time "february" "2022." Jokes a lot- might be masking some deficits? Difficulty with problem solving. Poor awareness of deficits/safety. Slightly sleepy during session, "I am supposed to keep my arm down" despite continually attempting to bring LUE towards face.      General Comments General comments (skin integrity, edema, etc.): Wife present during session. Pt wants to do everything she can to get pt back home as she can get 24 help if needed; does not want pt to go to [facility] CIR due to a previous unfavorable experience.    Exercises     Assessment/Plan    PT Assessment Patient needs continued PT services  PT Problem List Decreased strength;Decreased mobility;Decreased safety awareness;Decreased range of motion;Impaired sensation;Decreased balance;Decreased activity tolerance;Decreased cognition       PT Treatment Interventions DME instruction;Therapeutic exercise;Gait training;Balance training;Neuromuscular re-education;Stair training;Functional mobility training;Therapeutic activities;Patient/family education;Cognitive remediation    PT Goals (Current goals can be found in the Care Plan section)  Acute Rehab PT Goals Patient Stated Goal: to return home PT Goal Formulation: With patient/family Time For Goal Achievement: 09/24/20 Potential to Achieve Goals: Good    Frequency Min 4X/week   Barriers to discharge Inaccessible home environment stairs to enter home    Co-evaluation               AM-PAC PT "6 Clicks" Mobility  Outcome Measure Help needed turning from your back to your side while in a flat bed without using bedrails?: A Little Help needed moving from lying on your back to sitting on the side of a flat bed without using bedrails?: A Lot Help needed  moving to and from a bed to a chair (including a wheelchair)?: A Lot Help needed standing up from a chair using your arms (e.g.,  wheelchair or bedside chair)?: A Lot Help needed to walk in hospital room?: A Lot Help needed climbing 3-5 steps with a railing? : Total 6 Click Score: 12    End of Session Equipment Utilized During Treatment: Gait belt Activity Tolerance: Patient tolerated treatment well Patient left: in bed;with call bell/phone within reach;with family/visitor present;with nursing/sitter in room Nurse Communication: Mobility status PT Visit Diagnosis: Hemiplegia and hemiparesis;Difficulty in walking, not elsewhere classified (R26.2);Unsteadiness on feet (R26.81) Hemiplegia - Right/Left: Left Hemiplegia - dominant/non-dominant: Non-dominant Hemiplegia - caused by: Cerebral infarction    Time: 3887-1959 PT Time Calculation (min) (ACUTE ONLY): 22 min   Charges:   PT Evaluation $PT Eval Moderate Complexity: 1 Mod          Marisa Severin, PT, DPT Acute Rehabilitation Services Pager [phone] Office [place] 09/10/2020, 1:29 PM

## 2020-09-10 NOTE — Progress Notes (Addendum)
Patient failed SLP eval. Not tolerating meds crushed in applesauce. Due for ASA and Brilinta post IR.  Attempted X3 to place NGT by 3 separate nurses. Met with resistance, and small amount of blood from both nostrils. Unable to place NGT.  Cortrak order placed for tomorrow AM. Dr. Leonie Man aware.  71 Dr. Estanislado Pandy aware of above, order given for ASA supp.

## 2020-09-10 NOTE — Progress Notes (Signed)
Inpatient Rehab Admissions Coordinator Note:   Per therapy recommendations, pt was screened for CIR candidacy by Shann Medal, PT, DPT.  At this time we are recommending a CIR consult and I will place an order per our protocol.  Please contact me with questions.   Shann Medal, PT, DPT [phone] 09/10/20 3:28 PM

## 2020-09-10 NOTE — Progress Notes (Addendum)
This RN noticed that patient's ART line had a dampened waveform and no longer read accurate BP. I re-leveled and zeroed the ART line with no success. I then attempted to flush the ART line but it would not flush - all stopcocks were open. [place], RN attempted to flush ART with normal saline and it started working again. ART line with appropriate waveform read SBP >190. I gave PRN labetalol at this time. Cleviprex gtt max. Will continue to monitor patient and attempt to bring SBP back down to 120-140.   2200 update: even after PRN medications BP was still not within parameters. I flushed the IV that the cleviprex was going through and and it shot out the side of the pigtail like there was a hole in it. I tightened all connections but was unable to get the IV to work correctly. The pump never alarmed. Bed wet, likely with the cleviprex. I redressed the IV and added a new pigtail. IV flushes fine now. Will continue to manage BP.   2300: ART line good waveform and reading 137/51. Cleviprex gtt still max.

## 2020-09-11 ENCOUNTER — Inpatient Hospital Stay (HOSPITAL_COMMUNITY): Payer: Medicare Other

## 2020-09-11 DIAGNOSIS — I6601 Occlusion and stenosis of right middle cerebral artery: Secondary | ICD-10-CM | POA: Diagnosis not present

## 2020-09-11 DIAGNOSIS — I639 Cerebral infarction, unspecified: Secondary | ICD-10-CM | POA: Diagnosis not present

## 2020-09-11 DIAGNOSIS — R569 Unspecified convulsions: Secondary | ICD-10-CM | POA: Diagnosis not present

## 2020-09-11 LAB — BASIC METABOLIC PANEL
Anion gap: 11 (ref 5–15)
BUN: 34 mg/dL — ABNORMAL HIGH (ref 8–23)
CO2: 18 mmol/L — ABNORMAL LOW (ref 22–32)
Calcium: 8.2 mg/dL — ABNORMAL LOW (ref 8.9–10.3)
Chloride: 108 mmol/L (ref 98–111)
Creatinine, Ser: 1.85 mg/dL — ABNORMAL HIGH (ref 0.61–1.24)
GFR, Estimated: 36 mL/min — ABNORMAL LOW (ref >60)
Glucose, Bld: 201 mg/dL — ABNORMAL HIGH (ref 70–99)
Potassium: 4.5 mmol/L (ref 3.5–5.1)
Sodium: 137 mmol/L (ref 135–145)

## 2020-09-11 LAB — CBC
HCT: 27.7 % — ABNORMAL LOW (ref 39.0–52.0)
Hemoglobin: 9 g/dL — ABNORMAL LOW (ref 13.0–17.0)
MCH: 27.7 pg (ref 26.0–34.0)
MCHC: 32.5 g/dL (ref 30.0–36.0)
MCV: 85.2 fL (ref 80.0–100.0)
Platelets: 123 10*3/uL — ABNORMAL LOW (ref 150–400)
RBC: 3.25 MIL/uL — ABNORMAL LOW (ref 4.22–5.81)
RDW: 13.5 % (ref 11.5–15.5)
WBC: 7.6 10*3/uL (ref 4.0–10.5)
nRBC: 0 % (ref 0.0–0.2)

## 2020-09-11 LAB — GLUCOSE, CAPILLARY
Glucose-Capillary: 105 mg/dL — ABNORMAL HIGH (ref 70–99)
Glucose-Capillary: 120 mg/dL — ABNORMAL HIGH (ref 70–99)
Glucose-Capillary: 162 mg/dL — ABNORMAL HIGH (ref 70–99)
Glucose-Capillary: 173 mg/dL — ABNORMAL HIGH (ref 70–99)
Glucose-Capillary: 204 mg/dL — ABNORMAL HIGH (ref 70–99)
Glucose-Capillary: 214 mg/dL — ABNORMAL HIGH (ref 70–99)

## 2020-09-11 LAB — PHOSPHORUS: Phosphorus: 4 mg/dL (ref 2.5–4.6)

## 2020-09-11 LAB — MAGNESIUM: Magnesium: 2 mg/dL (ref 1.7–2.4)

## 2020-09-11 LAB — SODIUM
Sodium: 137 mmol/L (ref 135–145)
Sodium: 141 mmol/L (ref 135–145)
Sodium: 143 mmol/L (ref 135–145)

## 2020-09-11 MED ORDER — SODIUM CHLORIDE 0.9 % IV BOLUS
500.0000 mL | Freq: Once | INTRAVENOUS | Status: AC
  Administered 2020-09-11: 500 mL via INTRAVENOUS

## 2020-09-11 MED ORDER — INSULIN ASPART 100 UNIT/ML ~~LOC~~ SOLN
0.0000 [IU] | SUBCUTANEOUS | Status: DC
  Administered 2020-09-11: 5 [IU] via SUBCUTANEOUS
  Administered 2020-09-12: 8 [IU] via SUBCUTANEOUS
  Administered 2020-09-12 (×3): 5 [IU] via SUBCUTANEOUS
  Administered 2020-09-12 – 2020-09-13 (×3): 3 [IU] via SUBCUTANEOUS
  Administered 2020-09-13: 2 [IU] via SUBCUTANEOUS
  Administered 2020-09-13: 8 [IU] via SUBCUTANEOUS
  Administered 2020-09-13 (×2): 5 [IU] via SUBCUTANEOUS
  Administered 2020-09-13: 3 [IU] via SUBCUTANEOUS
  Administered 2020-09-14 (×3): 5 [IU] via SUBCUTANEOUS
  Administered 2020-09-14: 2 [IU] via SUBCUTANEOUS
  Administered 2020-09-14: 3 [IU] via SUBCUTANEOUS
  Administered 2020-09-15 (×2): 5 [IU] via SUBCUTANEOUS
  Administered 2020-09-15: 3 [IU] via SUBCUTANEOUS
  Administered 2020-09-15: 5 [IU] via SUBCUTANEOUS
  Administered 2020-09-15: 8 [IU] via SUBCUTANEOUS
  Administered 2020-09-16: 5 [IU] via SUBCUTANEOUS
  Administered 2020-09-16: 2 [IU] via SUBCUTANEOUS
  Administered 2020-09-16 (×3): 3 [IU] via SUBCUTANEOUS
  Administered 2020-09-16: 5 [IU] via SUBCUTANEOUS
  Administered 2020-09-17 (×2): 3 [IU] via SUBCUTANEOUS
  Administered 2020-09-17: 5 [IU] via SUBCUTANEOUS
  Administered 2020-09-17: 3 [IU] via SUBCUTANEOUS
  Administered 2020-09-17 (×2): 5 [IU] via SUBCUTANEOUS
  Administered 2020-09-18 (×3): 2 [IU] via SUBCUTANEOUS
  Administered 2020-09-18: 3 [IU] via SUBCUTANEOUS

## 2020-09-11 MED ORDER — INSULIN GLARGINE 100 UNIT/ML ~~LOC~~ SOLN
5.0000 [IU] | Freq: Every day | SUBCUTANEOUS | Status: DC
  Administered 2020-09-11 – 2020-09-19 (×9): 5 [IU] via SUBCUTANEOUS
  Filled 2020-09-11 (×9): qty 0.05

## 2020-09-11 MED ORDER — LEVETIRACETAM IN NACL 500 MG/100ML IV SOLN
500.0000 mg | Freq: Two times a day (BID) | INTRAVENOUS | Status: DC
  Administered 2020-09-11 – 2020-09-14 (×6): 500 mg via INTRAVENOUS
  Filled 2020-09-11 (×6): qty 100

## 2020-09-11 MED ORDER — CLONIDINE HCL 0.2 MG/24HR TD PTWK
0.2000 mg | MEDICATED_PATCH | TRANSDERMAL | Status: DC
  Administered 2020-09-11 – 2020-09-18 (×2): 0.2 mg via TRANSDERMAL
  Filled 2020-09-11 (×2): qty 1

## 2020-09-11 MED ORDER — SODIUM CHLORIDE 3 % IV SOLN
INTRAVENOUS | Status: AC
Start: 2020-09-11 — End: 2020-09-14
  Filled 2020-09-11 (×9): qty 500

## 2020-09-11 MED ORDER — FLUOXETINE HCL 20 MG PO CAPS
20.0000 mg | ORAL_CAPSULE | Freq: Every day | ORAL | Status: DC
  Administered 2020-09-11 – 2020-09-17 (×7): 20 mg
  Filled 2020-09-11 (×6): qty 1

## 2020-09-11 MED ORDER — LABETALOL HCL 5 MG/ML IV SOLN
20.0000 mg | INTRAVENOUS | Status: DC | PRN
  Administered 2020-09-11 – 2020-09-12 (×5): 20 mg via INTRAVENOUS
  Filled 2020-09-11 (×5): qty 4

## 2020-09-11 MED ORDER — PROSOURCE TF PO LIQD
45.0000 mL | Freq: Three times a day (TID) | ORAL | Status: DC
  Administered 2020-09-11 – 2020-09-17 (×19): 45 mL
  Filled 2020-09-11 (×20): qty 45

## 2020-09-11 MED ORDER — CLEVIDIPINE BUTYRATE 0.5 MG/ML IV EMUL
0.0000 mg/h | INTRAVENOUS | Status: DC
  Administered 2020-09-11: 28 mg/h via INTRAVENOUS
  Administered 2020-09-11: 23 mg/h via INTRAVENOUS
  Administered 2020-09-11: 26 mg/h via INTRAVENOUS
  Administered 2020-09-11: 28 mg/h via INTRAVENOUS
  Administered 2020-09-11: 15 mg/h via INTRAVENOUS
  Filled 2020-09-11: qty 50
  Filled 2020-09-11: qty 100

## 2020-09-11 MED ORDER — TICAGRELOR 90 MG PO TABS
45.0000 mg | ORAL_TABLET | Freq: Two times a day (BID) | ORAL | Status: DC
  Administered 2020-09-11 – 2020-09-17 (×14): 45 mg
  Filled 2020-09-11 (×14): qty 1

## 2020-09-11 MED ORDER — LORATADINE 10 MG PO TABS
10.0000 mg | ORAL_TABLET | Freq: Every evening | ORAL | Status: DC
  Administered 2020-09-11 – 2020-09-17 (×7): 10 mg
  Filled 2020-09-11 (×8): qty 1

## 2020-09-11 MED ORDER — DOXAZOSIN MESYLATE 2 MG PO TABS
2.0000 mg | ORAL_TABLET | Freq: Every day | ORAL | Status: DC
  Administered 2020-09-11 – 2020-09-17 (×7): 2 mg
  Filled 2020-09-11 (×8): qty 1

## 2020-09-11 MED ORDER — HEPARIN SODIUM (PORCINE) 5000 UNIT/ML IJ SOLN
5000.0000 [IU] | Freq: Three times a day (TID) | INTRAMUSCULAR | Status: DC
  Administered 2020-09-11 – 2020-09-20 (×27): 5000 [IU] via SUBCUTANEOUS
  Filled 2020-09-11 (×26): qty 1

## 2020-09-11 MED ORDER — HYDRALAZINE HCL 20 MG/ML IJ SOLN
20.0000 mg | Freq: Four times a day (QID) | INTRAMUSCULAR | Status: DC | PRN
  Administered 2020-09-12 – 2020-09-14 (×2): 20 mg via INTRAVENOUS
  Filled 2020-09-11 (×2): qty 1

## 2020-09-11 MED ORDER — OSMOLITE 1.5 CAL PO LIQD
1000.0000 mL | ORAL | Status: DC
  Administered 2020-09-11 – 2020-09-14 (×4): 1000 mL
  Filled 2020-09-11 (×4): qty 1000

## 2020-09-11 MED ORDER — HYDRALAZINE HCL 20 MG/ML IJ SOLN: 20.0000 mg | Freq: Four times a day (QID) | INTRAMUSCULAR | Status: DC | PRN

## 2020-09-11 MED ORDER — ATORVASTATIN CALCIUM 10 MG PO TABS
10.0000 mg | ORAL_TABLET | Freq: Every day | ORAL | Status: DC
  Administered 2020-09-11 – 2020-09-17 (×7): 10 mg
  Filled 2020-09-11 (×7): qty 1

## 2020-09-11 MED ORDER — CLEVIDIPINE BUTYRATE 0.5 MG/ML IV EMUL
INTRAVENOUS | Status: AC
  Filled 2020-09-11: qty 100

## 2020-09-11 NOTE — Progress Notes (Addendum)
Dr. Theda Sers (neuro/stroke) paged at this time regarding patient's ART SBP >140 on max cleviprex and all PRNs given. Will await further orders.   [id number]: Dr. Theda Sers re-paged at this time.   5797: Verbal order from Dr. Theda Sers to increase max cleviprex to 28.

## 2020-09-11 NOTE — Progress Notes (Signed)
EEG complete - results pending 

## 2020-09-11 NOTE — Anesthesia Postprocedure Evaluation (Signed)
Anesthesia Post Note  Patient: Josimar Corning  Procedure(s) Performed: IR WITH ANESTHESIA (N/A )     Patient location during evaluation: PACU Anesthesia Type: General Level of consciousness: awake and alert Pain management: pain level controlled Vital Signs Assessment: post-procedure vital signs reviewed and stable Respiratory status: spontaneous breathing, nonlabored ventilation, respiratory function stable and patient connected to nasal cannula oxygen Cardiovascular status: stable and blood pressure returned to baseline Postop Assessment: no apparent nausea or vomiting Anesthetic complications: no   No complications documented.  Last Vitals:  Vitals:   09/10/20 2300 09/11/20 0000  BP: 122/64   Pulse: 76   Resp: (!) 21   Temp:  36.6 C  SpO2: 96%     Last Pain:  Vitals:   09/11/20 0000  TempSrc: Axillary  PainSc:                  Jamail Cullers

## 2020-09-11 NOTE — Evaluation (Addendum)
Occupational Therapy Evaluation Patient Details Name: Frank [name] MRN: [id number] DOB: 01/10/1941 Today's Date: 09/11/2020    History of Present Illness Patient is a 80 y/o male who presents with dizziness, confusion, right weakness and right facial droop. NIH:4. 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 Repeat Head CT 3/1- increased SAH in right perisylvian fissure and parenchymal hemorrhage in the infarct territory with small volume intraventricular extension. MRI pending. PMH includes TBI, SDH, CKD, OSA, neuropathy, HTN, DM, AV block s/p PPM.   Clinical Impression   PT admitted with R MCA CVA. Pt currently with functional limitiations due to the deficits listed below (see OT problem list). Pt demonstrates total+2 mod transfer to the chair. Pt alert and requesting water. Pt will benefit from skilled OT to increase their independence and safety with adls and balance to allow discharge CIR.     Follow Up Recommendations  CIR    Equipment Recommendations  3 in 1 bedside commode;Wheelchair (measurements OT);Wheelchair cushion (measurements OT)    Recommendations for Other Services Rehab consult     Precautions / Restrictions Precautions Precautions: Fall Precaution Comments: watch o2      Mobility Bed Mobility Overal bed mobility: Needs Assistance Bed Mobility: Rolling;Supine to Sit Rolling: Mod assist   Supine to sit: Max assist;HOB elevated     General bed mobility comments: pt requires step by step assist to bring LE toward L side. Pt needs hand over hand to look toward L side. Pt requires (A) to elevate trunk off bed surface. Pt static sitting Mod with L lateral lean    Transfers Overall transfer level: Needs assistance Equipment used: 2 person hand held assist Transfers: Sit to/from [facility] Sit to Stand: +2 physical assistance;Mod assist Stand pivot  transfers: +2 physical assistance;Mod assist       General transfer comment: Pt transfering L side was unable to step toward the R side. pt requires total +2 Mod to swing hips toward chair. Pt sit<>stand x6 this session    Balance Overall balance assessment: Needs assistance Sitting-balance support: Bilateral upper extremity supported;Feet supported Sitting balance-Leahy Scale: Poor Sitting balance - Comments: L lateral lean   Standing balance support: Single extremity supported;During functional activity Standing balance-Leahy Scale: Poor Standing balance comment: reliant on external support                           ADL either performed or assessed with clinical judgement   ADL Overall ADL's : Needs assistance/impaired Eating/Feeding: NPO   Grooming: Maximal assistance Grooming Details (indicate cue type and reason): oral care opens mouth appropriately, asking for water Upper Body Bathing: Maximal assistance   Lower Body Bathing: Maximal assistance   Upper Body Dressing : Maximal assistance   Lower Body Dressing: Maximal assistance   Toilet Transfer: +2 for physical assistance;Moderate assistance             General ADL Comments: pt restless in the bed and motivated to sit EOB. pt attempting R side exit iwth therapist on L side. Pt with L inattention noted     Vision         Perception     Praxis      Pertinent Vitals/Pain Pain Assessment: No/denies pain     Hand Dominance Right   Extremity/Trunk Assessment Upper Extremity Assessment Upper Extremity Assessment: LUE deficits/detail LUE Deficits / Details: placed on pillow for  edema management unable to lift against gravity  decreased fine motor, decrease awareness to L side LUE Coordination: decreased fine motor;decreased gross motor   Lower Extremity Assessment Lower Extremity Assessment: Defer to PT evaluation;LLE deficits/detail LLE Deficits / Details: knee flexion with standing and needs  (A) to fully extend with blocking   Cervical / Trunk Assessment Cervical / Trunk Assessment: Kyphotic   Communication Communication Communication: No difficulties   Cognition Arousal/Alertness: Awake/alert Behavior During Therapy: Flat affect;Impulsive Overall Cognitive Status: Difficult to assess Area of Impairment: Following commands;Awareness;Safety/judgement                       Following Commands: Follows one step commands inconsistently;Follows one step commands with increased time Safety/Judgement: Decreased awareness of safety;Decreased awareness of deficits Awareness: Intellectual   General Comments: pt following 1 step commands inconsistently.pt expressed need to go to bathroom but not voiding with standing   General Comments  VSS RN present during transfer    Exercises     Shoulder Instructions      Home Living Family/patient expects to be discharged to:: Private residence Living Arrangements: Spouse/significant other;Other (Comment) Available Help at Discharge: Family;Personal care attendant;Other (Comment) Type of Home: House Home Access: Stairs to enter [employer] of Steps: 4 Entrance Stairs-Rails: Can reach both Home Layout: One level     Bathroom Shower/Tub: Occupational psychologist: Handicapped height Bathroom Accessibility: Yes   Home Equipment: Environmental consultant - 2 wheels;Shower seat;Grab bars - tub/shower;Grab bars - toilet;Cane - single point;Walker - 4 wheels;Wheelchair - manual          Prior Functioning/Environment Level of Independence: Independent with assistive device(s)        Comments: Does own ADLs. Has a caregiver coming 5 days/week from 8-4 pm. Just got rid of night time nurse recently. Going to OPPT twice/week for balance. Does not drive. Goes out to lunch with friends. Doing really well per wife.        OT Problem List: Decreased strength;Decreased activity tolerance;Impaired balance (sitting and/or  standing);Decreased safety awareness;Decreased knowledge of use of DME or AE;Decreased knowledge of precautions;Impaired UE functional use;Obesity;Cardiopulmonary status limiting activity;Decreased cognition      OT Treatment/Interventions: Self-care/ADL training;Therapeutic exercise;Neuromuscular education;Energy conservation;DME and/or AE instruction;Manual therapy;Modalities;Therapeutic activities;Balance training;Patient/family education;Cognitive remediation/compensation    OT Goals(Current goals can be found in the care plan section) Acute Rehab OT Goals Patient Stated Goal: wants water OT Goal Formulation: Patient unable to participate in goal setting Potential to Achieve Goals: Good  OT Frequency: Min 2X/week   Barriers to D/C:            Co-evaluation PT/OT/SLP Co-Evaluation/Treatment: Yes Reason for Co-Treatment: For patient/therapist safety;To address functional/ADL transfers   OT goals addressed during session: ADL's and self-care;Proper use of Adaptive equipment and DME;Strengthening/ROM      AM-PAC OT "6 Clicks" Daily Activity     Outcome Measure Help from another person eating meals?: A Lot Help from another person taking care of personal grooming?: A Lot Help from another person toileting, which includes using toliet, bedpan, or urinal?: A Lot Help from another person bathing (including washing, rinsing, drying)?: A Lot Help from another person to put on and taking off regular upper body clothing?: A Lot Help from another person to put on and taking off regular lower body clothing?: A Lot 6 Click Score: 12   End of Session Equipment Utilized During Treatment: Oxygen Nurse Communication: Mobility status;Weight bearing status  Activity Tolerance: Patient tolerated treatment  well Patient left: in chair;with call bell/phone within reach;with chair alarm set;with restraints reapplied  OT Visit Diagnosis: Unsteadiness on feet (R26.81);Muscle weakness (generalized)  (M62.81)                Time: 2256-7209 OT Time Calculation (min): 25 min Charges:  OT General Charges $OT Visit: 1 Visit   Fleeta Emmer, OTR/L  Acute Rehabilitation Services Pager: [phone] Office: [phone] .   Jeri Modena 09/11/2020, 3:42 PM

## 2020-09-11 NOTE — Consult Note (Signed)
Physical Medicine and Rehabilitation Consult Reason for Consult: Right side weakness and facial droop with altered mental status Referring Physician: Dr. Leonie Man   HPI: Frank [name] is a 80 y.o. 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  1 level home 4 steps to entry.  He has a caregiver 5 days a week from 8 to 4 PM.  He has been receiving outpatient therapy twice a week.  Patient does not drive.  Independent with assistive device prior to admission.  Presented 09/09/2020 with acute onset of right-sided weakness and facial droop.  Cranial CT scan redemonstrated subdural hematomas overlying the bilateral cerebral convexities.  As before these hematomas measure 9 mm in greatest thickness.  Although intermediate to low density, the density of the right subdural hematoma has subtly increased from the examination of 02/08/2020.  No midline shift noted.  CT angiogram head and neck positive for emergent occlusion of posterior right MCA distal M2 branch at its bifurcation.  Patient underwent mechanical thrombectomy placement of rescue right ICA stent balloon angioplasty per interventional radiology.  Latest MRI/MRA showed large acute subacute left side infarction with parenchymal hemorrhage measuring 4 cm.  Element of acute right subarachnoid hemorrhage.  Chronic bilateral subdural hematomas.  No significant midline shift.  Echocardiogram with ejection fraction of 65 to 70% grade 1 diastolic dysfunction.  Patient is currently maintained on low-dose aspirin as well as Brilinta for CVA prophylaxis.[name]  Keppra for seizure prophylaxis.  He did receive Cleviprex for blood pressure control.  Therapy  evaluations completed due to patient decreased functional mobility recommendations for physical medicine rehab consult. He is able to follow simple commands.    Review of Systems  Constitutional: Negative for chills and fever.  HENT: Negative for hearing loss.   Eyes: Negative for blurred vision and double vision.  Respiratory: Negative for cough and shortness of breath.   Cardiovascular: Positive for palpitations and leg swelling. Negative for chest pain.  Gastrointestinal: Positive for constipation. Negative for heartburn, nausea and vomiting.       GERD  Genitourinary: Positive for urgency. Negative for dysuria, flank pain and hematuria.  Musculoskeletal: Positive for joint pain and myalgias.  Skin: Negative for rash.  Neurological: Positive for weakness and headaches.  All other systems reviewed and are negative.  Past Medical History:  Diagnosis Date  . Allergy    Rhinitis  . Anemia    NOS iron deficient and B12 deficient  . Arthritis   . 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 Diabetes mellitus    Type 2  . GERD (gastroesophageal reflux disease)   . Hyperlipidemia   . Hypertension   . 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 OSA (obstructive sleep apnea)    cpap  . TBI (traumatic brain injury) [facility])    Past Surgical History:  Procedure Laterality Date  . APPENDECTOMY    . arm fracture Left 2011   with hardware  . BURR HOLE Bilateral 08/04/2017   Procedure: Haskell Flirt;  Surgeon: [name], MD;  Location: [place];  Service: Neurosurgery;  Laterality: Bilateral;  . COLONOSCOPY    . CRANIOTOMY Left 12/01/2017   Procedure: Trudee Kuster Holes CRANIOTOMY HEMATOMA EVACUATION SUBDURAL;  Surgeon: [name], MD;  Location: [facility];  Service: Neurosurgery;  Laterality: Left;  . ESOPHAGOGASTRODUODENOSCOPY  2006   gastritis  . PACEMAKER IMPLANT N/A 05/14/2020   Procedure: PACEMAKER IMPLANT;  Surgeon: [name], MD;  Location: [facility];  Service: Cardiovascular;  Laterality: N/A;   . RADIOLOGY WITH ANESTHESIA N/A 09/09/2020   Procedure: IR WITH ANESTHESIA;  Surgeon: Radiologist, Medication, MD;  Location: [place];  Service: Radiology;  Laterality: N/A;  . ROTATOR CUFF REPAIR    . SEPTOPLASTY  1959   Deviated Septum  . 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 Heart disease Mother   . Breast cancer Mother   . Stroke Father   . Allergies Father        Father and children  . Coronary artery disease Brother   . Diabetes Neg Hx   . Colon cancer Neg Hx    Social History:  reports that he quit smoking about 37 years ago. He smoked 1.00 pack per day. He has never used smokeless tobacco. He reports current alcohol use of about 2.0 standard drinks of alcohol per week. He reports that he does not use drugs. Allergies:  Allergies  Allergen Reactions  . Lisinopril Cough  . Invokana [Canagliflozin] Other (See Comments)    Stopped by MD  . Penicillins Other (See Comments)    Allergic as a child.  Patient does not remember reaction.  Has had cephalasporins without problems.  . Sulfamethoxazole Other (See Comments)    Unknown reaction- from childhood   Facility-Administered Medications Prior to Admission  Medication Dose Route Frequency Provider Last Rate Last Admin  . cyanocobalamin ((VITAMIN B-12)) injection 1,000 mcg  1,000 mcg Intramuscular Q30 days Janith Lima, MD   1,000 mcg at 08/21/20 0935   Medications Prior to Admission  Medication Sig Dispense Refill  . acetaminophen (TYLENOL) 325 MG tablet Take 1-2 tablets (325-650 mg total) by mouth every 4 (four) hours as needed for mild pain.    [name] amLODipine (NORVASC) 10 MG tablet Take 10 mg by mouth at bedtime.    [name] atorvastatin (LIPITOR) 20 MG tablet TAKE ONE-HALF TABLET BY  MOUTH DAILY (Patient taking differently: Take 10 mg by mouth daily.) 45 tablet 3  . azelastine (ASTELIN) 0.1 % nasal spray Place 1 spray into both nostrils 2 (two) times daily. Use in each nostril as  directed 90 mL 1  . Carbonyl Iron 25 MG TABS Take 25 mg by mouth daily with breakfast.     . doxazosin (CARDURA) 4 MG tablet TAKE ONE-HALF TABLET BY  MOUTH AT BEDTIME (Patient taking differently: Take 2 mg by mouth at bedtime.) 45 tablet 3  . FLUoxetine (PROZAC) 20 MG capsule Take 1 capsule (20 mg total) by mouth daily. 90 capsule 1  . glipiZIDE (GLUCOTROL XL) 10 MG 24 hr tablet Take 1 tablet (10 mg total) by mouth daily. 90 tablet 1  . Glucagon (GVOKE HYPOPEN 2-PACK) 1 MG/0.2ML SOAJ Inject 1 Act into the skin daily as needed. (Patient taking differently: Inject 1 each into the skin as needed (low blood sugar).) 2 mL 5  . insulin glargine, 2 Unit Dial, (TOUJEO MAX SOLOSTAR) 300 UNIT/ML Solostar Pen Inject 10 Units into the skin daily. 6 mL 1  . Insulin Pen Needle 32G X 6 MM MISC 1 Act by Does not apply route daily. 100 each 1  . levocetirizine (XYZAL) 5 MG tablet Take 1 tablet (5 mg total) by mouth  every evening. 90 tablet 1  . mupirocin ointment (BACTROBAN) 2 % Apply 1 application topically 2 (two) times daily. 22 g 0  . pantoprazole (PROTONIX) 40 MG tablet TAKE 1 TABLET BY MOUTH  DAILY (Patient taking differently: Take 40 mg by mouth daily.) 90 tablet 1  . PRESCRIPTION MEDICATION CPAP- At bedtime    . psyllium (METAMUCIL) 58.6 % powder Take 1 packet by mouth daily as needed (for constipation- MIX AND DRINK).     [name] solifenacin (VESICARE) 10 MG tablet TAKE 1 TABLET BY MOUTH  DAILY (Patient taking differently: Take 10 mg by mouth daily.) 90 tablet 1  . telmisartan (MICARDIS) 20 MG tablet TAKE 1 TABLET BY MOUTH  DAILY (Patient taking differently: Take 20 mg by mouth daily.) 90 tablet 1  . triamcinolone cream (KENALOG) 0.1 % Apply 1 application topically daily as needed (to affected areas).       Home: Home Living Family/patient expects to be discharged to:: Private residence Living Arrangements: Spouse/significant other,Other (Comment) Available Help at Discharge: Family,Personal care  attendant,Other (Comment) Type of Home: House Home Access: Stairs to enter [employer] of Steps: 4 Entrance Stairs-Rails: Can reach both [place]: One level Bathroom Shower/Tub: Multimedia programmer: Handicapped height Bathroom Accessibility: Yes Home Equipment: Environmental consultant - 2 wheels,Shower seat,Grab bars - tub/shower,Grab bars - toilet,Cane - single point,Walker - 4 wheels,Wheelchair - manual  Functional History: Prior Function Level of Independence: Independent with assistive device(s) Comments: Does own ADLs. Has a caregiver coming 5 days/week from 8-4 pm. Just got rid of night time nurse recently. Going to [facility] twice/week for balance. Does not drive. Goes out to lunch with friends. Doing really well per wife. Functional Status:  Mobility: Bed Mobility Overal bed mobility: Needs Assistance Bed Mobility: Supine to Sit,Sit to Supine Supine to sit: Mod assist,HOB elevated Sit to supine: Mod assist General bed mobility comments: Step by step cues/assist to bring LEs to EOB, reach for rail with RUE and elevate trunk. Pt with left lateral lean once upright. Assist to bring LEs into bed to return to supine. Transfers Overall transfer level: Needs assistance Equipment used: 2 person hand held assist Transfers: Sit to/from Stand Sit to Stand: Min assist,+2 safety/equipment General transfer comment: ASsist of 2 to power to standing with left lateral lean. Left knee buckling with no awareness by patient. Ambulation/Gait Ambulation/Gait assistance: Mod assist,+2 physical assistance Gait Distance (Feet): 3 Feet Assistive device: 2 person hand held assist Gait Pattern/deviations: Wide base of support,Shuffle General Gait Details: Able to take a few steps along side bed with assist for weight shifting, balance and left knee stability due to multiple episodes of buckling on left. Gait velocity: decreased    ADL:    Cognition: Cognition Overall Cognitive Status:  Impaired/Different from baseline Orientation Level: Oriented X4 Cognition Arousal/Alertness: Lethargic Behavior During Therapy: WFL for tasks assessed/performed Overall Cognitive Status: Impaired/Different from baseline Area of Impairment: Orientation,Attention,Following commands,Safety/judgement,Awareness,Problem solving Orientation Level: Disoriented to,Situation,Time Current Attention Level: Sustained Following Commands: Follows one step commands with increased time (repetition) Safety/Judgement: Decreased awareness of safety,Decreased awareness of deficits Awareness: Intellectual Problem Solving: Requires verbal cues,Requires tactile cues,Slow processing,Decreased initiation,Difficulty sequencing General Comments: Did not know why he was in the hospital but able to recall later in session it was due to stroke after being told. Roughly oriented to time "february" "2022." Jokes a lot- might be masking some deficits? Difficulty with problem solving. Poor awareness of deficits/safety. Slightly sleepy during session, "I am supposed to keep my arm down" despite  continually attempting to bring LUE towards face.  Blood pressure (!) 106/44, pulse 84, temperature 98.8 F (37.1 C), temperature source Axillary, resp. rate (!) 22, height 5\' 9"  (1.753 m), weight 99 kg, SpO2 96 %. Physical Exam Gen: no distress, normal appearing HEENT: oral mucosa pink and moist, NCAT Cardio: Reg rate Chest: increased work of breathing Abd: soft, non-distended Ext: no edema Psych: pleasant, normal affect Skin: intact Neurological:     Comments: Patient is a bit lethargic but arousable.  Makes eye contact with examiner.  He does provide his name and place.  Follows simple commands. Decreased sensation left side. Left sided hemineglect. 2/5 strength left side.    Results for orders placed or performed during the hospital encounter of 09/09/20 (from the past 24 hour(s))  Hemoglobin A1c     Status: Abnormal    Collection Time: 09/10/20  6:06 AM  Result Value Ref Range   Hgb A1c MFr Bld 7.8 (H) 4.8 - 5.6 %   Mean Plasma Glucose 177.16 mg/dL  Lipid panel     Status: Abnormal   Collection Time: 09/10/20  6:06 AM  Result Value Ref Range   Cholesterol 107 0 - 200 mg/dL   Triglycerides 210 (H) <150 mg/dL   HDL 36 (L) >40 mg/dL   Total CHOL/HDL Ratio 3.0 RATIO   VLDL 42 (H) 0 - 40 mg/dL   LDL Cholesterol 29 0 - 99 mg/dL  CBC with Differential/Platelet     Status: Abnormal   Collection Time: 09/10/20  6:06 AM  Result Value Ref Range   WBC 8.2 4.0 - 10.5 K/uL   RBC 3.50 (L) 4.22 - 5.81 MIL/uL   Hemoglobin 9.6 (L) 13.0 - 17.0 g/dL   HCT 28.9 (L) 39.0 - 52.0 %   MCV 82.6 80.0 - 100.0 fL   MCH 27.4 26.0 - 34.0 pg   MCHC 33.2 30.0 - 36.0 g/dL   RDW 13.4 11.5 - 15.5 %   Platelets 143 (L) 150 - 400 K/uL   nRBC 0.0 0.0 - 0.2 %   Neutrophils Relative % 80 %   Neutro Abs 6.7 1.7 - 7.7 K/uL   Lymphocytes Relative 8 %   Lymphs Abs 0.7 0.7 - 4.0 K/uL   Monocytes Relative 10 %   Monocytes Absolute 0.8 0.1 - 1.0 K/uL   Eosinophils Relative 0 %   Eosinophils Absolute 0.0 0.0 - 0.5 K/uL   Basophils Relative 1 %   Basophils Absolute 0.1 0.0 - 0.1 K/uL   Immature Granulocytes 1 %   Abs Immature Granulocytes 0.04 0.00 - 0.07 K/uL  Basic metabolic panel     Status: Abnormal   Collection Time: 09/10/20  6:06 AM  Result Value Ref Range   Sodium 136 135 - 145 mmol/L   Potassium 4.6 3.5 - 5.1 mmol/L   Chloride 106 98 - 111 mmol/L   CO2 23 22 - 32 mmol/L   Glucose, Bld 182 (H) 70 - 99 mg/dL   BUN 34 (H) 8 - 23 mg/dL   Creatinine, Ser 1.98 (H) 0.61 - 1.24 mg/dL   Calcium 8.2 (L) 8.9 - 10.3 mg/dL   GFR, Estimated 33 (L) >60 mL/min   Anion gap 7 5 - 15  Glucose, capillary     Status: Abnormal   Collection Time: 09/10/20  9:16 AM  Result Value Ref Range   Glucose-Capillary 171 (H) 70 - 99 mg/dL  Glucose, capillary     Status: Abnormal   Collection Time: 09/10/20  2:37 PM  Result Value Ref Range    Glucose-Capillary 191 (H) 70 - 99 mg/dL  Glucose, capillary     Status: Abnormal   Collection Time: 09/10/20  5:30 PM  Result Value Ref Range   Glucose-Capillary 180 (H) 70 - 99 mg/dL  Urine rapid drug screen (hosp performed)     Status: None   Collection Time: 09/10/20  7:04 PM  Result Value Ref Range   Opiates NONE DETECTED NONE DETECTED   Cocaine NONE DETECTED NONE DETECTED   Benzodiazepines NONE DETECTED NONE DETECTED   Amphetamines NONE DETECTED NONE DETECTED   Tetrahydrocannabinol NONE DETECTED NONE DETECTED   Barbiturates NONE DETECTED NONE DETECTED  Glucose, capillary     Status: Abnormal   Collection Time: 09/10/20  7:53 PM  Result Value Ref Range   Glucose-Capillary 176 (H) 70 - 99 mg/dL  Glucose, capillary     Status: Abnormal   Collection Time: 09/10/20 11:36 PM  Result Value Ref Range   Glucose-Capillary 158 (H) 70 - 99 mg/dL  Glucose, capillary     Status: Abnormal   Collection Time: 09/11/20  4:01 AM  Result Value Ref Range   Glucose-Capillary 173 (H) 70 - 99 mg/dL  CBC     Status: Abnormal   Collection Time: 09/11/20  4:40 AM  Result Value Ref Range   WBC 7.6 4.0 - 10.5 K/uL   RBC 3.25 (L) 4.22 - 5.81 MIL/uL   Hemoglobin 9.0 (L) 13.0 - 17.0 g/dL   HCT 27.7 (L) 39.0 - 52.0 %   MCV 85.2 80.0 - 100.0 fL   MCH 27.7 26.0 - 34.0 pg   MCHC 32.5 30.0 - 36.0 g/dL   RDW 13.5 11.5 - 15.5 %   Platelets 123 (L) 150 - 400 K/uL   nRBC 0.0 0.0 - 0.2 %   CT HEAD WO CONTRAST  Result Date: 09/10/2020 CLINICAL DATA:  Stroke post revascularization and stent placement EXAM: CT HEAD WITHOUT CONTRAST TECHNIQUE: Contiguous axial images were obtained from the base of the skull through the vertex without intravenous contrast. COMPARISON:  Earlier same day FINDINGS: Brain: Parenchymal and subarachnoid hemorrhage again identified in the right cerebral hemisphere particularly about the sylvian fissure. Extent of hyperdensity has decreased probably reflecting component of contrast staining.  This is superimposed on hypoattenuation reflecting evolving infarction with extent better seen on MRI. Small volume hemorrhage is present within the occipital horns. Bilateral subdural hematomas are again identified with small volume hyperdense hemorrhage. Stable partial effacement of the right lateral ventricle and minor leftward midline shift. No hydrocephalus. Vascular: Right MCA branch stent.  No new findings. Skull: Prior left craniotomy and right burr hole. Sinuses/Orbits: No acute finding. Other: None. IMPRESSION: Evolving right MCA territory infarction with hemorrhagic conversion. Extent is better seen on same day MRI. Parenchymal, sulcal subarachnoid, and intraventricular hemorrhage are present. Bilateral subdural hematomas again identified with small volume hyperdense hemorrhage. Stable mild mass effect. Electronically Signed   By: Macy Mis M.D.   On: 09/10/2020 19:12   CT HEAD WO CONTRAST  Result Date: 09/10/2020 CLINICAL DATA:  Acute stroke suspected EXAM: CT HEAD WITHOUT CONTRAST TECHNIQUE: Contiguous axial images were obtained from the base of the skull through the vertex without intravenous contrast. COMPARISON:  Flat panel CT 09/09/2020 FINDINGS: Brain: Subarachnoid hemorrhage/contrast has increased/diffused along the right sylvian fissure and adjacent sulci. Visible cytotoxic edema in the right perisylvian cortex. There is a degree of parenchymal hemorrhage at the posterior caudate and deep white matter on the right, reaching the ependymoma  with small volume intraventricular extension at the dependent lateral ventricles. No hydrocephalus. Bilateral chronic subdural hematoma with similar degree of wispy and nodular high-density posteriorly. Vascular: Right MCA branch stent. Skull: Prior burr holes on left-sided craniotomy. Sinuses/Orbits: No acute finding.  Bilateral cataract resection. Other: A call has been placed to the ordering provider. IMPRESSION: 1. Increased subarachnoid  hemorrhage/contrast around the right sylvian fissure. There is also some interval parenchymal hemorrhage in the infarct territory with small volume intraventricular extension. No hydrocephalus or midline shift. 2. Right perisylvian cortical infarct correlating with affected vessel and the perfusion maps. 3. Chronic subdural hematomas. Electronically Signed   By: Monte Fantasia M.D.   On: 09/10/2020 06:01   MR ANGIO HEAD WO CONTRAST  Result Date: 09/10/2020 CLINICAL DATA:  Acute onset of difficulty standing and walking. Right facial droop. Status post revascularization of right inferior M2 M3 occlusion. Status post balloon angioplasty of right internal carotid artery. Placement intracranial stent. EXAM: MRI HEAD WITHOUT CONTRAST TECHNIQUE: Multiplanar, multiecho pulse sequences of the brain and surrounding structures were obtained without intravenous contrast. COMPARISON:  CT head without contrast, CT perfusion head and CTA head 09/09/2020. CT head without contrast 09/10/2020. FINDINGS: Brain: The diffusion-weighted images demonstrate an acute/subacute infarct similar in size to the predicted area of ischemia on the CT perfusion study. Parenchymal hemorrhage is noted throughout the bulk of the infarct. The parenchymal hemorrhage measures over 4 cm. Element of subarachnoid hemorrhage is present as well. Diffuse cortical edema is present within the infarct territory. There is some mass effect with out significant midline shift. No acute left-sided infarct is present. Mild atrophy is present. Chronic bilateral subdural hematomas are again noted. The internal auditory canals are within normal limits. The brainstem and cerebellum are within normal limits. Vascular: Flow is present in the major intracranial arteries. Artifact from intracranial stent within inferior left M2 segment noted. Skull and upper cervical spine: Craniocervical junction is within normal limits. Slight anterolisthesis is present at C2-3. Marrow  signal is normal. Sinuses/Orbits: Mild mucosal thickening is scattered throughout the ethmoid air cells and frontal sinuses bilaterally. No significant mastoid effusion is present. No fluid levels are present. Bilateral lens replacements are noted. Globes and orbits are otherwise unremarkable. IMPRESSION: 1. Large acute/subacute left-sided infarct with parenchymal hemorrhage measuring over 4 cm. PH2 hemorrhage. 2. Element of acute right subarachnoid hemorrhage. 3. Chronic bilateral subdural hematomas. 4. No significant midline shift. Electronically Signed   By: San Morelle M.D.   On: 09/10/2020 12:38   MR BRAIN WO CONTRAST  Result Date: 09/10/2020 CLINICAL DATA:  Acute onset of difficulty standing and walking. Right facial droop. Status post revascularization of right inferior M2 M3 occlusion. Status post balloon angioplasty of right internal carotid artery. Placement intracranial stent. EXAM: MRI HEAD WITHOUT CONTRAST TECHNIQUE: Multiplanar, multiecho pulse sequences of the brain and surrounding structures were obtained without intravenous contrast. COMPARISON:  CT head without contrast, CT perfusion head and CTA head 09/09/2020. CT head without contrast 09/10/2020. FINDINGS: Brain: The diffusion-weighted images demonstrate an acute/subacute infarct similar in size to the predicted area of ischemia on the CT perfusion study. Parenchymal hemorrhage is noted throughout the bulk of the infarct. The parenchymal hemorrhage measures over 4 cm. Element of subarachnoid hemorrhage is present as well. Diffuse cortical edema is present within the infarct territory. There is some mass effect with out significant midline shift. No acute left-sided infarct is present. Mild atrophy is present. Chronic bilateral subdural hematomas are again noted. The internal auditory canals are within normal  limits. The brainstem and cerebellum are within normal limits. Vascular: Flow is present in the major intracranial arteries.  Artifact from intracranial stent within inferior left M2 segment noted. Skull and upper cervical spine: Craniocervical junction is within normal limits. Slight anterolisthesis is present at C2-3. Marrow signal is normal. Sinuses/Orbits: Mild mucosal thickening is scattered throughout the ethmoid air cells and frontal sinuses bilaterally. No significant mastoid effusion is present. No fluid levels are present. Bilateral lens replacements are noted. Globes and orbits are otherwise unremarkable. IMPRESSION: 1. Large acute/subacute left-sided infarct with parenchymal hemorrhage measuring over 4 cm. PH2 hemorrhage. 2. Element of acute right subarachnoid hemorrhage. 3. Chronic bilateral subdural hematomas. 4. No significant midline shift. Electronically Signed   By: San Morelle M.D.   On: 09/10/2020 12:38   CT CEREBRAL PERFUSION W CONTRAST  Result Date: 09/09/2020 CLINICAL DATA:  80 year old male code stroke. Neglect. Right parietal lobe implicated. History of unresolved subdural hematomas. EXAM: CT ANGIOGRAPHY HEAD AND NECK CT PERFUSION BRAIN TECHNIQUE: Multidetector CT imaging of the head and neck was performed using the standard protocol during bolus administration of intravenous contrast. Multiplanar CT image reconstructions and MIPs were obtained to evaluate the vascular anatomy. Carotid stenosis measurements (when applicable) are obtained utilizing NASCET criteria, using the distal internal carotid diameter as the denominator. Multiphase CT imaging of the brain was performed following IV bolus contrast injection. Subsequent parametric perfusion maps were calculated using RAPID software. CONTRAST:  133mL OMNIPAQUE IOHEXOL 350 MG/ML SOLN COMPARISON:  Plain head CT 1105 hours today reported separately. Intracranial MRA 06/24/2017. FINDINGS: CT Brain Perfusion Findings: ASPECTS: 10 CBF (<30%) Volume: 35mL Perfusion (Tmax>6.0s) volume: 63mL.  Hypoperfusion index 0.1 Mismatch Volume: 25mL Infarction  Location:Posterior right MCA territory. CTA NECK Skeleton: Previous left superior frontal craniotomy. Chronic bilateral calvarium burr holes. No acute osseous abnormality identified. Cervical spine degeneration. Upper chest: Partially visible left chest cardiac pacemaker. Otherwise negative. Other neck: Mild motion artifact. No acute findings identified in the neck. Aortic arch: 3 vessel arch configuration. Relatively mild Calcified aortic atherosclerosis. Right carotid system: Mild calcified plaque of the brachiocephalic artery without stenosis. Normal right CCA origin. Intermittent moderate calcified plaque in the right CCA proximal to the bifurcation, and at the bifurcation a radiographic string sign occurs at the ICA origin related to very bulky calcified plaque on series 5, image 108. Additional bulky plaque through the distal bulb, tandem stenosis on series 5, image 103. But the vessel remains patent. No additional stenosis to the skull base. Left carotid system: Mild left CCA origin but similar moderate left CCA and left carotid bifurcation through ICA bulb calcified plaque. On this side subsequent stenosis is less than 50 % with respect to the distal vessel. Vertebral arteries: Mild to moderate proximal right subclavian calcified plaque. Calcified plaque at the right vertebral artery origin with mild to moderate stenosis. Patent right vertebral then to the skull base. Mild proximal left subclavian and left vertebral artery origin calcified plaque without stenosis. Tortuous left V1 segment. Codominant left vertebral is otherwise normal to the skull base. CTA HEAD Posterior circulation: Negative distal vertebral arteries and basilar. The left V4 is mildly dominant. Normal PICA origins. Patent SCA and PCA origins. Small posterior communicating arteries. Mild left PCA P1 irregularity. Bilateral PCA branches are within normal limits. Anterior circulation: Both ICA siphons are patent. On the left cavernous segment  calcified plaque results in no stenosis. Normal left ophthalmic and posterior communicating artery origins. On the right cavernous segment calcified plaque results in no significant stenosis.  Patent carotid termini, MCA and ACA origins. Dominant left A1. 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 Otherwise the bilateral ACA branches are within normal limits. Left MCA M1 segment and bifurcation are patent. Left MCA branches appears table since 2018. Right MCA M1 and bifurcation remain patent. Dominant posterior right MCA M2 branch is occluded at the level of punctate calcification on series 12, image 14. This is at a bifurcation where the superior branch remains patent. But the parent vessel is occluded. This calcification is new compared to last year. Venous sinuses: Patent. Anatomic variants: Dominant left ACA A1. Review of the MIP images confirms the above findings IMPRESSION: 1. Positive for emergent occlusion of a posterior Right MCA distal M2 branch at its bifurcation. Embolized calcification there is new since last year (series 12, image 14). 2. CT Perfusion detects a small infarct core of 13 mL with regional penumbra of 41 mL (28 mL mismatch). 3. Superimposed very bulky calcified plaque at the Right ICA origin with tandem HIGH-GRADE STENOSIS, including Radiographic String Sign (series 7, image 216). 4. The above were reviewed in person with Dr. Roland Rack on 09/09/2020 at 1128 hours. 5. 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 6. Bulky Left Carotid bifurcation and proximal left ICA calcified plaque without significant stenosis. 7. Up to moderate Right Vertebral Artery origin stenosis. Electronically Signed   By: Genevie Ann M.D.   On: 09/09/2020 11:46   DG Abd Portable 1V  Result Date: 09/09/2020 CLINICAL DATA:  Nasogastric tube placement EXAM: PORTABLE ABDOMEN - 1 VIEW COMPARISON:  None.  FINDINGS: No nasogastric tube is identified within the visualized abdomen. The right flank and pelvis are excluded from view. The visualized abdominal gas pattern is unremarkable. IMPRESSION: Nasogastric tube not identified within the visualized distal esophagus and abdomen. Chest radiograph may be helpful to identify the location of a malpositioned catheter. These results will be called to the ordering clinician or representative by the Radiologist Assistant, and communication documented in the PACS or [employer]. Electronically Signed   By: Fidela Salisbury MD   On: 09/09/2020 23:49   ECHOCARDIOGRAM COMPLETE  Result Date: 09/09/2020    ECHOCARDIOGRAM REPORT   Patient Name:   Frank [name] [facility] Date of Exam: 09/09/2020 Medical Rec #:  [id number]             Height:       69.0 in Accession #:    [id number]            Weight:       218.3 lb Date of Birth:  [birth year]             BSA:          2.144 m Patient Age:    48 years              BP:           100/45 mmHg Patient Gender: M                     HR:           70 bpm. Exam Location:  Inpatient Procedure: 2D Echo, Cardiac Doppler, Color Doppler and Intracardiac            Opacification Agent Indications:    Stroke  History:        Patient has prior history of Echocardiogram examinations, most  recent 06/24/2017. Risk Factors:Sleep Apnea, Diabetes and Former                 Smoker. GERD.  Sonographer:    Clayton Lefort RDCS (AE) Referring Phys: [phone] MCNEILL P [facility]  Sonographer Comments: Technically difficult study due to poor echo windows. Image acquisition challenging due to respiratory motion. IMPRESSIONS  1. Left ventricular ejection fraction, by estimation, is 65 to 70%. The left ventricle has normal function. The left ventricle has no regional wall motion abnormalities. There is severe concentric left ventricular hypertrophy. Left ventricular diastolic  parameters are consistent with Grade I diastolic dysfunction (impaired  relaxation). Elevated left atrial pressure.  2. Right ventricular systolic function is normal. The right ventricular size is mildly enlarged. There is normal pulmonary artery systolic pressure.  3. The mitral valve is normal in structure. No evidence of mitral valve regurgitation. Mild mitral stenosis. The mean mitral valve gradient is 3.0 mmHg. Moderate mitral annular calcification.  4. The aortic valve is normal in structure. There is severe calcifcation of the aortic valve. There is severe thickening of the aortic valve. Aortic valve regurgitation is not visualized. Mild aortic valve stenosis. Aortic valve mean gradient measures 9.0 mmHg.  5. The inferior vena cava is normal in size with greater than 50% respiratory variability, suggesting right atrial pressure of 3 mmHg. FINDINGS  Left Ventricle: Left ventricular ejection fraction, by estimation, is 65 to 70%. The left ventricle has normal function. The left ventricle has no regional wall motion abnormalities. Definity contrast agent was given IV to delineate the left ventricular  endocardial borders. The left ventricular internal cavity size was normal in size. There is severe concentric left ventricular hypertrophy. Left ventricular diastolic parameters are consistent with Grade I diastolic dysfunction (impaired relaxation). Elevated left atrial pressure. Right Ventricle: The right ventricular size is mildly enlarged. No increase in right ventricular wall thickness. Right ventricular systolic function is normal. There is normal pulmonary artery systolic pressure. Left Atrium: Left atrial size was normal in size. Right Atrium: Right atrial size was normal in size. Pericardium: There is no evidence of pericardial effusion. Mitral Valve: The mitral valve is normal in structure. There is moderate thickening of the mitral valve leaflet(s). There is moderate calcification of the mitral valve leaflet(s). Moderate mitral annular calcification. No evidence of mitral  valve regurgitation. Mild mitral valve stenosis. MV peak gradient, 10.9 mmHg. The mean mitral valve gradient is 3.0 mmHg. Tricuspid Valve: The tricuspid valve is normal in structure. Tricuspid valve regurgitation is mild . No evidence of tricuspid stenosis. Aortic Valve: The aortic valve is normal in structure. There is severe calcifcation of the aortic valve. There is severe thickening of the aortic valve. Aortic valve regurgitation is not visualized. Mild aortic stenosis is present. Aortic valve mean gradient measures 9.0 mmHg. Aortic valve peak gradient measures 15.7 mmHg. Aortic valve area, by VTI measures 1.81 cm. Pulmonic Valve: The pulmonic valve was normal in structure. Pulmonic valve regurgitation is not visualized. No evidence of pulmonic stenosis. Aorta: The aortic root is normal in size and structure. Venous: The inferior vena cava is normal in size with greater than 50% respiratory variability, suggesting right atrial pressure of 3 mmHg. IAS/Shunts: No atrial level shunt detected by color flow Doppler.  LEFT VENTRICLE PLAX 2D LVIDd:         4.70 cm  Diastology LVIDs:         3.30 cm  LV e' medial:    6.42 cm/s LV PW:  1.80 cm  LV E/e' medial:  15.5 LV IVS:        1.60 cm  LV e' lateral:   6.64 cm/s LVOT diam:     2.00 cm  LV E/e' lateral: 15.0 LV SV:         71 LV SV Index:   33 LVOT Area:     3.14 cm  RIGHT VENTRICLE             IVC RV Basal diam:  3.50 cm     IVC diam: 1.60 cm RV Mid diam:    3.90 cm RV S prime:     11.70 cm/s TAPSE (M-mode): 2.4 cm LEFT ATRIUM             Index       RIGHT ATRIUM           Index LA diam:        3.60 cm 1.68 cm/m  RA Area:     17.70 cm LA Vol (A2C):   59.5 ml 27.75 ml/m RA Volume:   43.70 ml  20.38 ml/m LA Vol (A4C):   53.2 ml 24.81 ml/m LA Biplane Vol: 61.9 ml 28.87 ml/m  AORTIC VALVE AV Area (Vmax):    1.69 cm AV Area (Vmean):   1.83 cm AV Area (VTI):     1.81 cm AV Vmax:           198.33 cm/s AV Vmean:          137.000 cm/s AV VTI:             0.390 m AV Peak Grad:      15.7 mmHg AV Mean Grad:      9.0 mmHg LVOT Vmax:         107.00 cm/s LVOT Vmean:        79.800 cm/s LVOT VTI:          0.225 m LVOT/AV VTI ratio: 0.58  AORTA Ao Root diam: 3.20 cm Ao Asc diam:  3.20 cm MITRAL VALVE MV Area (PHT): 1.89 cm     SHUNTS MV Area VTI:   1.89 cm     Systemic VTI:  0.22 m MV Peak grad:  10.9 mmHg    Systemic Diam: 2.00 cm MV Mean grad:  3.0 mmHg MV Vmax:       1.65 m/s MV Vmean:      78.7 cm/s MV Decel Time: 401 msec MV E velocity: 99.50 cm/s MV A velocity: 175.00 cm/s MV E/A ratio:  0.57 Ena Dawley MD Electronically signed by Ena Dawley MD Signature Date/Time: 09/09/2020/10:39:50 PM    Final    CT HEAD CODE STROKE WO CONTRAST  Result Date: 09/09/2020 CLINICAL DATA:  Code stroke. Neuro deficit, acute, stroke suspected. Additional history provided: Right-sided facial droop, last known well 9:15 a.m. EXAM: CT HEAD WITHOUT CONTRAST TECHNIQUE: Contiguous axial images were obtained from the base of the skull through the vertex without intravenous contrast. COMPARISON:  Head CT 02/08/2020. FINDINGS: Brain: Mild cerebral atrophy. Redemonstrated subdural hematomas overlying the bilateral cerebral convexities. These are unchanged in size as compared to the prior head CT of 02/08/2020 again measuring up to 9 mm in thickness bilaterally. Although intermediate to low-density, the density of the right subdural hematoma has subtly increased as compared to the prior examination which may reflect some degree of interval hemorrhage into the collection. 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 Scattered small foci of calcification within both collections. Symmetric mass  effect upon the underlying cerebral hemispheres without midline shift. Ill-defined hypoattenuation within the cerebral white matter is nonspecific, but compatible with chronic small vessel ischemic disease. No acute demarcated cortical infarct. No evidence of  intracranial mass. Vascular: No hyperdense vessel.  Atherosclerotic calcifications. Skull: No calvarial fracture.  Left-sided cranioplasty. Sinuses/Orbits: Partial opacification of left ethmoid air cells. Background mild bilateral ethmoid and sphenoid sinus mucosal thickening. ASPECTS ([place] Stroke Program Early CT Score) - Ganglionic level infarction (caudate, lentiform nuclei, internal capsule, insula, M1-M3 cortex): 7 - Supraganglionic infarction (M4-M6 cortex): 3 Total score (0-10 with 10 being normal): 10 Dr. Kathrynn Speed aware of these findings at 11:20 a.m. on 09/09/2020 (discussed with Dr. Lars Pinks). IMPRESSION: Redemonstrated subdural hematomas overlying the bilateral cerebral convexities. As before, these hematomas measure up to 9 mm in greatest thickness. Although intermediate to low-density, the density of the right subdural hematoma has subtly increased from the examination of 02/08/2020, suggestive of some degree of interval hemorrhage into the collection. The left subdural hematoma remains low-density. Unchanged symmetric mass effect upon the cerebral hemispheres without midline shift. No acute demarcated cortical infarct is identified.  ASPECTS is 10. Stable generalized atrophy of the brain and chronic small vessel ischemic disease. Electronically Signed   By: Kellie Simmering DO   On: 09/09/2020 11:35   CT ANGIO HEAD CODE STROKE  Result Date: 09/09/2020 CLINICAL DATA:  80 year old male code stroke. Neglect. Right parietal lobe implicated. History of unresolved subdural hematomas. EXAM: CT ANGIOGRAPHY HEAD AND NECK CT PERFUSION BRAIN TECHNIQUE: Multidetector CT imaging of the head and neck was performed using the standard protocol during bolus administration of intravenous contrast. Multiplanar CT image reconstructions and MIPs were obtained to evaluate the vascular anatomy. Carotid stenosis measurements (when applicable) are obtained utilizing NASCET criteria, using the distal internal  carotid diameter as the denominator. Multiphase CT imaging of the brain was performed following IV bolus contrast injection. Subsequent parametric perfusion maps were calculated using RAPID software. CONTRAST:  181mL OMNIPAQUE IOHEXOL 350 MG/ML SOLN COMPARISON:  Plain head CT 1105 hours today reported separately. Intracranial MRA 06/24/2017. FINDINGS: CT Brain Perfusion Findings: ASPECTS: 10 CBF (<30%) Volume: 86mL Perfusion (Tmax>6.0s) volume: 18mL.  Hypoperfusion index 0.1 Mismatch Volume: 29mL Infarction Location:Posterior right MCA territory. CTA NECK Skeleton: Previous left superior frontal craniotomy. Chronic bilateral calvarium burr holes. No acute osseous abnormality identified. Cervical spine degeneration. Upper chest: Partially visible left chest cardiac pacemaker. Otherwise negative. Other neck: Mild motion artifact. No acute findings identified in the neck. Aortic arch: 3 vessel arch configuration. Relatively mild Calcified aortic atherosclerosis. Right carotid system: Mild calcified plaque of the brachiocephalic artery without stenosis. Normal right CCA origin. Intermittent moderate calcified plaque in the right CCA proximal to the bifurcation, and at the bifurcation a radiographic string sign occurs at the ICA origin related to very bulky calcified plaque on series 5, image 108. Additional bulky plaque through the distal bulb, tandem stenosis on series 5, image 103. But the vessel remains patent. No additional stenosis to the skull base. Left carotid system: Mild left CCA origin but similar moderate left CCA and left carotid bifurcation through ICA bulb calcified plaque. On this side subsequent stenosis is less than 50 % with respect to the distal vessel. Vertebral arteries: Mild to moderate proximal right subclavian calcified plaque. Calcified plaque at the right vertebral artery origin with mild to moderate stenosis. Patent right vertebral then to the skull base. Mild proximal left subclavian and  left vertebral artery origin calcified plaque without stenosis. Tortuous left  V1 segment. Codominant left vertebral is otherwise normal to the skull base. CTA HEAD Posterior circulation: Negative distal vertebral arteries and basilar. The left V4 is mildly dominant. Normal PICA origins. Patent SCA and PCA origins. Small posterior communicating arteries. Mild left PCA P1 irregularity. Bilateral PCA branches are within normal limits. Anterior circulation: Both ICA siphons are patent. On the left cavernous segment calcified plaque results in no stenosis. Normal left ophthalmic and posterior communicating artery origins. On the right cavernous segment calcified plaque results in no significant stenosis. Patent carotid termini, MCA and ACA origins. Dominant left A1. 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 Otherwise the bilateral ACA branches are within normal limits. Left MCA M1 segment and bifurcation are patent. Left MCA branches appears table since 2018. Right MCA M1 and bifurcation remain patent. Dominant posterior right MCA M2 branch is occluded at the level of punctate calcification on series 12, image 14. This is at a bifurcation where the superior branch remains patent. But the parent vessel is occluded. This calcification is new compared to last year. Venous sinuses: Patent. Anatomic variants: Dominant left ACA A1. Review of the MIP images confirms the above findings IMPRESSION: 1. Positive for emergent occlusion of a posterior Right MCA distal M2 branch at its bifurcation. Embolized calcification there is new since last year (series 12, image 14). 2. CT Perfusion detects a small infarct core of 13 mL with regional penumbra of 41 mL (28 mL mismatch). 3. Superimposed very bulky calcified plaque at the Right ICA origin with tandem HIGH-GRADE STENOSIS, including Radiographic String Sign (series 7, image 216). 4. The above were reviewed in  person with Dr. Roland Rack on 09/09/2020 at 1128 hours. 5. 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 6. Bulky Left Carotid bifurcation and proximal left ICA calcified plaque without significant stenosis. 7. Up to moderate Right Vertebral Artery origin stenosis. Electronically Signed   By: Genevie Ann M.D.   On: 09/09/2020 11:46   CT ANGIO NECK CODE STROKE  Result Date: 09/09/2020 CLINICAL DATA:  80 year old male code stroke. Neglect. Right parietal lobe implicated. History of unresolved subdural hematomas. EXAM: CT ANGIOGRAPHY HEAD AND NECK CT PERFUSION BRAIN TECHNIQUE: Multidetector CT imaging of the head and neck was performed using the standard protocol during bolus administration of intravenous contrast. Multiplanar CT image reconstructions and MIPs were obtained to evaluate the vascular anatomy. Carotid stenosis measurements (when applicable) are obtained utilizing NASCET criteria, using the distal internal carotid diameter as the denominator. Multiphase CT imaging of the brain was performed following IV bolus contrast injection. Subsequent parametric perfusion maps were calculated using RAPID software. CONTRAST:  1101mL OMNIPAQUE IOHEXOL 350 MG/ML SOLN COMPARISON:  Plain head CT 1105 hours today reported separately. Intracranial MRA 06/24/2017. FINDINGS: CT Brain Perfusion Findings: ASPECTS: 10 CBF (<30%) Volume: 25mL Perfusion (Tmax>6.0s) volume: 75mL.  Hypoperfusion index 0.1 Mismatch Volume: 22mL Infarction Location:Posterior right MCA territory. CTA NECK Skeleton: Previous left superior frontal craniotomy. Chronic bilateral calvarium burr holes. No acute osseous abnormality identified. Cervical spine degeneration. Upper chest: Partially visible left chest cardiac pacemaker. Otherwise negative. Other neck: Mild motion artifact. No acute findings identified in the neck. Aortic arch: 3 vessel arch configuration. Relatively mild Calcified aortic atherosclerosis.  Right carotid system: Mild calcified plaque of the brachiocephalic artery without stenosis. Normal right CCA origin. Intermittent moderate calcified plaque in the right CCA proximal to the bifurcation, and at the bifurcation a radiographic string sign  occurs at the ICA origin related to very bulky calcified plaque on series 5, image 108. Additional bulky plaque through the distal bulb, tandem stenosis on series 5, image 103. But the vessel remains patent. No additional stenosis to the skull base. Left carotid system: Mild left CCA origin but similar moderate left CCA and left carotid bifurcation through ICA bulb calcified plaque. On this side subsequent stenosis is less than 50 % with respect to the distal vessel. Vertebral arteries: Mild to moderate proximal right subclavian calcified plaque. Calcified plaque at the right vertebral artery origin with mild to moderate stenosis. Patent right vertebral then to the skull base. Mild proximal left subclavian and left vertebral artery origin calcified plaque without stenosis. Tortuous left V1 segment. Codominant left vertebral is otherwise normal to the skull base. CTA HEAD Posterior circulation: Negative distal vertebral arteries and basilar. The left V4 is mildly dominant. Normal PICA origins. Patent SCA and PCA origins. Small posterior communicating arteries. Mild left PCA P1 irregularity. Bilateral PCA branches are within normal limits. Anterior circulation: Both ICA siphons are patent. On the left cavernous segment calcified plaque results in no stenosis. Normal left ophthalmic and posterior communicating artery origins. On the right cavernous segment calcified plaque results in no significant stenosis. Patent carotid termini, MCA and ACA origins. Dominant left A1. 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 Otherwise the bilateral ACA branches are within normal limits. Left MCA M1 segment and  bifurcation are patent. Left MCA branches appears table since 2018. Right MCA M1 and bifurcation remain patent. Dominant posterior right MCA M2 branch is occluded at the level of punctate calcification on series 12, image 14. This is at a bifurcation where the superior branch remains patent. But the parent vessel is occluded. This calcification is new compared to last year. Venous sinuses: Patent. Anatomic variants: Dominant left ACA A1. Review of the MIP images confirms the above findings IMPRESSION: 1. Positive for emergent occlusion of a posterior Right MCA distal M2 branch at its bifurcation. Embolized calcification there is new since last year (series 12, image 14). 2. CT Perfusion detects a small infarct core of 13 mL with regional penumbra of 41 mL (28 mL mismatch). 3. Superimposed very bulky calcified plaque at the Right ICA origin with tandem HIGH-GRADE STENOSIS, including Radiographic String Sign (series 7, image 216). 4. The above were reviewed in person with Dr. Roland Rack on 09/09/2020 at 1128 hours. 5. 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 6. Bulky Left Carotid bifurcation and proximal left ICA calcified plaque without significant stenosis. 7. Up to moderate Right Vertebral Artery origin stenosis. Electronically Signed   By: Genevie Ann M.D.   On: 09/09/2020 11:46     Assessment/Plan: Diagnosis: right MCA stroke 1. Does the need for close, 24 hr/day medical supervision in concert with the patient's rehab needs make it unreasonable for this patient to be served in a less intensive setting? Yes 2. Co-Morbidities requiring supervision/potential complications:  1. Severe LVH 2. Grade 1 diastolic dysfunction 3. Type 2 DM: will require AC/HS monitoring of CBGs.  4. Subarachnoid hemorrhage 5. Left sided weakness: he will require intensive PT and OT 6. Impaired cognition: he will require intensive SLP 3. Due to bladder management, bowel management,  safety, skin/wound care, disease management, medication administration, pain management and patient education, does the patient require 24 hr/day rehab nursing? Yes 4. Does the patient require coordinated care of a  physician, rehab nurse, therapy disciplines of PT< OT, SLP to address physical and functional deficits in the context of the above medical diagnosis(es)? Yes Addressing deficits in the following areas: balance, endurance, locomotion, strength, transferring, bowel/bladder control, bathing, dressing, feeding, grooming, toileting, cognition, speech, language, swallowing and psychosocial support 5. Can the patient actively participate in an intensive therapy program of at least 3 hrs of therapy per day at least 5 days per week?  Potentially, will monitor as patient progresses.  6. The potential for patient to make measurable gains while on inpatient rehab is good 7. Anticipated functional outcomes upon discharge from inpatient rehab are min assist  with PT, min assist with OT, min assist with SLP. 8. Estimated rehab length of stay to reach the above functional goals is: 2-3 weeks 9. Anticipated discharge destination: Home 10. Overall Rehab/Functional Prognosis: good  RECOMMENDATIONS: This patient's condition is appropriate for continued rehabilitative care in the following setting: CIR Patient has agreed to participate in recommended program. N/A Note that insurance prior authorization may be required for reimbursement for recommended care.  Comment: Thank you for this consult. Admission coordinator to follow.   I have personally performed a face to face diagnostic evaluation, including, but not limited to relevant history and physical exam findings, of this patient and developed relevant assessment and plan.  Additionally, I have reviewed and concur with the physician assistant's documentation above.  Leeroy Cha, MD  Lavon Paganini [place], PA-C 09/11/2020

## 2020-09-11 NOTE — Progress Notes (Signed)
Referring Physician(s): Code stroke- Frank [name] (neurology)  Supervising Physician: Frank [name]  Patient Status:  [facility] - In-pt  Chief Complaint: Stroke F/U  Subjective:  History of acute CVA s/p cerebral arteriogram with emergent mechanical thrombectomy and stent placement of right MCA inferior division M2/M3 occlusion, along with angioplasty of right ICA stenosis, achieving a TICI 3 revascularization via right femoral approach 09/09/2020 by Dr. Estanislado [name]. Patient laying in bed resting comfortably. Drowsy today- intermittently follows simple commands, does not keep eyes open. Wife at bedside. Can spontaneously move all extremities with weakness of left side. Right femoral puncture site c/d/i.  CT head 09/10/2020: 1. Evolving right MCA territory infarction with hemorrhagic conversion. Extent is better seen on same day MRI. Parenchymal, sulcal subarachnoid, and intraventricular hemorrhage are present. 2. Bilateral subdural hematomas again identified with small volume hyperdense hemorrhage. 3. Stable mild mass effect.  MR/MRA brain head 09/10/2020: 1. Large acute/subacute left-sided infarct with parenchymal hemorrhage measuring over 4 cm. PH2 hemorrhage. 2. Element of acute right subarachnoid hemorrhage. 3. Chronic bilateral subdural hematomas. 4. No significant midline shift.   Allergies: Lisinopril, Invokana [canagliflozin], Penicillins, and Sulfamethoxazole  Medications: Prior to Admission medications   Medication Sig Start Date End Date Taking? Authorizing Provider  acetaminophen (TYLENOL) 325 MG tablet Take 1-2 tablets (325-650 mg total) by mouth every 4 (four) hours as needed for mild pain. 05/15/20  Yes Frank Friar, PA-C  amLODipine (NORVASC) 10 MG tablet Take 10 mg by mouth at bedtime.   Yes [provider]  atorvastatin (LIPITOR) 20 MG tablet TAKE ONE-HALF TABLET BY  MOUTH DAILY Patient taking differently: Take 10 mg by mouth daily.  06/06/20  Yes Frank Lima, MD  azelastine (ASTELIN) 0.1 % nasal spray Place 1 spray into both nostrils 2 (two) times daily. Use in each nostril as directed 11/27/19  Yes Frank Lima, MD  Carbonyl Iron 25 MG TABS Take 25 mg by mouth daily with breakfast.    Yes [provider]  doxazosin (CARDURA) 4 MG tablet TAKE ONE-HALF TABLET BY  MOUTH AT BEDTIME Patient taking differently: Take 2 mg by mouth at bedtime. 06/06/20  Yes Frank Lima, MD  FLUoxetine (PROZAC) 20 MG capsule Take 1 capsule (20 mg total) by mouth daily. 04/10/20  Yes Frank Lima, MD  glipiZIDE (GLUCOTROL XL) 10 MG 24 hr tablet Take 1 tablet (10 mg total) by mouth daily. 04/10/20  Yes Frank Lima, MD  Glucagon (GVOKE HYPOPEN 2-PACK) 1 MG/0.2ML SOAJ Inject 1 Act into the skin daily as needed. Patient taking differently: Inject 1 each into the skin as needed (low blood sugar). 04/30/20  Yes Frank Lima, MD  insulin glargine, 2 Unit Dial, (TOUJEO MAX SOLOSTAR) 300 UNIT/ML Solostar Pen Inject 10 Units into the skin daily. 04/16/20  Yes Frank Lima, MD  Insulin Pen Needle 32G X 6 MM MISC 1 Act by Does not apply route daily. 04/08/20  Yes Frank Lima, MD  levocetirizine (XYZAL) 5 MG tablet Take 1 tablet (5 mg total) by mouth every evening. 11/27/19  Yes Frank Lima, MD  mupirocin ointment (BACTROBAN) 2 % Apply 1 application topically 2 (two) times daily. 05/31/20  Yes Frank Salvage, FNP  pantoprazole (PROTONIX) 40 MG tablet TAKE 1 TABLET BY MOUTH  DAILY Patient taking differently: Take 40 mg by mouth daily. 04/30/20  Yes Frank Lima, MD  PRESCRIPTION MEDICATION CPAP- At bedtime   Yes [provider]  psyllium (METAMUCIL) 58.6 %  powder Take 1 packet by mouth daily as needed (for constipation- MIX AND DRINK).    Yes [provider]  solifenacin (VESICARE) 10 MG tablet TAKE 1 TABLET BY MOUTH  DAILY Patient taking differently: Take 10 mg by mouth daily. 08/09/20  Yes Frank Lima, MD  telmisartan (MICARDIS) 20 MG tablet TAKE 1 TABLET BY MOUTH  DAILY Patient taking differently: Take 20 mg by mouth daily. 09/07/20  Yes Frank Lima, MD  triamcinolone cream (KENALOG) 0.1 % Apply 1 application topically daily as needed (to affected areas).  07/26/19  Yes [provider]     Vital Signs: BP (!) 116/44   Pulse 84   Temp 99.7 F (37.6 C) (Axillary)   Resp 19   Ht 5' 9" (1.753 m)   Wt 218 lb 4.1 oz (99 kg)   SpO2 95%   BMI 32.23 kg/m   Physical Exam Vitals and nursing note reviewed.  Constitutional:      General: He is not in acute distress.    Comments: Drowsy.  Pulmonary:     Effort: Pulmonary effort is normal. No respiratory distress.  Skin:    General: Skin is warm and dry.     Comments: Right femoral puncture site soft without active bleeding or hematoma.  Neurological:     Comments: Drowsy but intermittently follows simple commands. PERRL bilaterally. Can spontaneously move all extremities with weakness of left side. Distal pulses (DPs) 1+ bilaterally.      Imaging: CT HEAD WO CONTRAST  Result Date: 09/10/2020 CLINICAL DATA:  Stroke post revascularization and stent placement EXAM: CT HEAD WITHOUT CONTRAST TECHNIQUE: Contiguous axial images were obtained from the base of the skull through the vertex without intravenous contrast. COMPARISON:  Earlier same day FINDINGS: Brain: Parenchymal and subarachnoid hemorrhage again identified in the right cerebral hemisphere particularly about the sylvian fissure. Extent of hyperdensity has decreased probably reflecting component of contrast staining. This is superimposed on hypoattenuation reflecting evolving infarction with extent better seen on MRI. Small volume hemorrhage is present within the occipital horns. Bilateral subdural hematomas are again identified with small volume hyperdense hemorrhage. Stable partial effacement of the right lateral ventricle and minor leftward midline shift. No  hydrocephalus. Vascular: Right MCA branch stent.  No new findings. Skull: Prior left craniotomy and right burr hole. Sinuses/Orbits: No acute finding. Other: None. IMPRESSION: Evolving right MCA territory infarction with hemorrhagic conversion. Extent is better seen on same day MRI. Parenchymal, sulcal subarachnoid, and intraventricular hemorrhage are present. Bilateral subdural hematomas again identified with small volume hyperdense hemorrhage. Stable mild mass effect. Electronically Signed   By: Macy Mis M.D.   On: 09/10/2020 19:12   CT HEAD WO CONTRAST  Result Date: 09/10/2020 CLINICAL DATA:  Acute stroke suspected EXAM: CT HEAD WITHOUT CONTRAST TECHNIQUE: Contiguous axial images were obtained from the base of the skull through the vertex without intravenous contrast. COMPARISON:  Flat panel CT 09/09/2020 FINDINGS: Brain: Subarachnoid hemorrhage/contrast has increased/diffused along the right sylvian fissure and adjacent sulci. Visible cytotoxic edema in the right perisylvian cortex. There is a degree of parenchymal hemorrhage at the posterior caudate and deep white matter on the right, reaching the ependymoma with small volume intraventricular extension at the dependent lateral ventricles. No hydrocephalus. Bilateral chronic subdural hematoma with similar degree of wispy and nodular high-density posteriorly. Vascular: Right MCA branch stent. Skull: Prior burr holes on left-sided craniotomy. Sinuses/Orbits: No acute finding.  Bilateral cataract resection. Other: A call has been placed to the ordering provider.  IMPRESSION: 1. Increased subarachnoid hemorrhage/contrast around the right sylvian fissure. There is also some interval parenchymal hemorrhage in the infarct territory with small volume intraventricular extension. No hydrocephalus or midline shift. 2. Right perisylvian cortical infarct correlating with affected vessel and the perfusion maps. 3. Chronic subdural hematomas. Electronically Signed    By: Monte Fantasia M.D.   On: 09/10/2020 06:01   MR ANGIO HEAD WO CONTRAST  Result Date: 09/10/2020 CLINICAL DATA:  Acute onset of difficulty standing and walking. Right facial droop. Status post revascularization of right inferior M2 M3 occlusion. Status post balloon angioplasty of right internal carotid artery. Placement intracranial stent. EXAM: MRI HEAD WITHOUT CONTRAST TECHNIQUE: Multiplanar, multiecho pulse sequences of the brain and surrounding structures were obtained without intravenous contrast. COMPARISON:  CT head without contrast, CT perfusion head and CTA head 09/09/2020. CT head without contrast 09/10/2020. FINDINGS: Brain: The diffusion-weighted images demonstrate an acute/subacute infarct similar in size to the predicted area of ischemia on the CT perfusion study. Parenchymal hemorrhage is noted throughout the bulk of the infarct. The parenchymal hemorrhage measures over 4 cm. Element of subarachnoid hemorrhage is present as well. Diffuse cortical edema is present within the infarct territory. There is some mass effect with out significant midline shift. No acute left-sided infarct is present. Mild atrophy is present. Chronic bilateral subdural hematomas are again noted. The internal auditory canals are within normal limits. The brainstem and cerebellum are within normal limits. Vascular: Flow is present in the major intracranial arteries. Artifact from intracranial stent within inferior left M2 segment noted. Skull and upper cervical spine: Craniocervical junction is within normal limits. Slight anterolisthesis is present at C2-3. Marrow signal is normal. Sinuses/Orbits: Mild mucosal thickening is scattered throughout the ethmoid air cells and frontal sinuses bilaterally. No significant mastoid effusion is present. No fluid levels are present. Bilateral lens replacements are noted. Globes and orbits are otherwise unremarkable. IMPRESSION: 1. Large acute/subacute left-sided infarct with  parenchymal hemorrhage measuring over 4 cm. PH2 hemorrhage. 2. Element of acute right subarachnoid hemorrhage. 3. Chronic bilateral subdural hematomas. 4. No significant midline shift. Electronically Signed   By: San Morelle M.D.   On: 09/10/2020 12:38   MR BRAIN WO CONTRAST  Result Date: 09/10/2020 CLINICAL DATA:  Acute onset of difficulty standing and walking. Right facial droop. Status post revascularization of right inferior M2 M3 occlusion. Status post balloon angioplasty of right internal carotid artery. Placement intracranial stent. EXAM: MRI HEAD WITHOUT CONTRAST TECHNIQUE: Multiplanar, multiecho pulse sequences of the brain and surrounding structures were obtained without intravenous contrast. COMPARISON:  CT head without contrast, CT perfusion head and CTA head 09/09/2020. CT head without contrast 09/10/2020. FINDINGS: Brain: The diffusion-weighted images demonstrate an acute/subacute infarct similar in size to the predicted area of ischemia on the CT perfusion study. Parenchymal hemorrhage is noted throughout the bulk of the infarct. The parenchymal hemorrhage measures over 4 cm. Element of subarachnoid hemorrhage is present as well. Diffuse cortical edema is present within the infarct territory. There is some mass effect with out significant midline shift. No acute left-sided infarct is present. Mild atrophy is present. Chronic bilateral subdural hematomas are again noted. The internal auditory canals are within normal limits. The brainstem and cerebellum are within normal limits. Vascular: Flow is present in the major intracranial arteries. Artifact from intracranial stent within inferior left M2 segment noted. Skull and upper cervical spine: Craniocervical junction is within normal limits. Slight anterolisthesis is present at C2-3. Marrow signal is normal. Sinuses/Orbits: Mild mucosal thickening is scattered  throughout the ethmoid air cells and frontal sinuses bilaterally. No significant  mastoid effusion is present. No fluid levels are present. Bilateral lens replacements are noted. Globes and orbits are otherwise unremarkable. IMPRESSION: 1. Large acute/subacute left-sided infarct with parenchymal hemorrhage measuring over 4 cm. PH2 hemorrhage. 2. Element of acute right subarachnoid hemorrhage. 3. Chronic bilateral subdural hematomas. 4. No significant midline shift. Electronically Signed   By: San Morelle M.D.   On: 09/10/2020 12:38   CT CEREBRAL PERFUSION W CONTRAST  Result Date: 09/09/2020 CLINICAL DATA:  80 year old male code stroke. Neglect. Right parietal lobe implicated. History of unresolved subdural hematomas. EXAM: CT ANGIOGRAPHY HEAD AND NECK CT PERFUSION BRAIN TECHNIQUE: Multidetector CT imaging of the head and neck was performed using the standard protocol during bolus administration of intravenous contrast. Multiplanar CT image reconstructions and MIPs were obtained to evaluate the vascular anatomy. Carotid stenosis measurements (when applicable) are obtained utilizing NASCET criteria, using the distal internal carotid diameter as the denominator. Multiphase CT imaging of the brain was performed following IV bolus contrast injection. Subsequent parametric perfusion maps were calculated using RAPID software. CONTRAST:  165m OMNIPAQUE IOHEXOL 350 MG/ML SOLN COMPARISON:  Plain head CT 1105 hours today reported separately. Intracranial MRA 06/24/2017. FINDINGS: CT Brain Perfusion Findings: ASPECTS: 10 CBF (<30%) Volume: 113mPerfusion (Tmax>6.0s) volume: 416m Hypoperfusion index 0.1 Mismatch Volume: 75m54mfarction Location:Posterior right MCA territory. CTA NECK Skeleton: Previous left superior frontal craniotomy. Chronic bilateral calvarium burr holes. No acute osseous abnormality identified. Cervical spine degeneration. Upper chest: Partially visible left chest cardiac pacemaker. Otherwise negative. Other neck: Mild motion artifact. No acute findings identified in the  neck. Aortic arch: 3 vessel arch configuration. Relatively mild Calcified aortic atherosclerosis. Right carotid system: Mild calcified plaque of the brachiocephalic artery without stenosis. Normal right CCA origin. Intermittent moderate calcified plaque in the right CCA proximal to the bifurcation, and at the bifurcation a radiographic string sign occurs at the ICA origin related to very bulky calcified plaque on series 5, image 108. Additional bulky plaque through the distal bulb, tandem stenosis on series 5, image 103. But the vessel remains patent. No additional stenosis to the skull base. Left carotid system: Mild left CCA origin but similar moderate left CCA and left carotid bifurcation through ICA bulb calcified plaque. On this side subsequent stenosis is less than 50 % with respect to the distal vessel. Vertebral arteries: Mild to moderate proximal right subclavian calcified plaque. Calcified plaque at the right vertebral artery origin with mild to moderate stenosis. Patent right vertebral then to the skull base. Mild proximal left subclavian and left vertebral artery origin calcified plaque without stenosis. Tortuous left V1 segment. Codominant left vertebral is otherwise normal to the skull base. CTA HEAD Posterior circulation: Negative distal vertebral arteries and basilar. The left V4 is mildly dominant. Normal PICA origins. Patent SCA and PCA origins. Small posterior communicating arteries. Mild left PCA P1 irregularity. Bilateral PCA branches are within normal limits. Anterior circulation: Both ICA siphons are patent. On the left cavernous segment calcified plaque results in no stenosis. Normal left ophthalmic and posterior communicating artery origins. On the right cavernous segment calcified plaque results in no significant stenosis. Patent carotid termini, MCA and ACA origins. Dominant left A1. 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 Otherwise the bilateral ACA branches are within normal limits. Left MCA M1 segment and bifurcation are patent. Left MCA branches appears table since 2018. Right MCA  M1 and bifurcation remain patent. Dominant posterior right MCA M2 branch is occluded at the level of punctate calcification on series 12, image 14. This is at a bifurcation where the superior branch remains patent. But the parent vessel is occluded. This calcification is new compared to last year. Venous sinuses: Patent. Anatomic variants: Dominant left ACA A1. Review of the MIP images confirms the above findings IMPRESSION: 1. Positive for emergent occlusion of a posterior Right MCA distal M2 branch at its bifurcation. Embolized calcification there is new since last year (series 12, image 14). 2. CT Perfusion detects a small infarct core of 13 mL with regional penumbra of 41 mL (28 mL mismatch). 3. Superimposed very bulky calcified plaque at the Right ICA origin with tandem HIGH-GRADE STENOSIS, including Radiographic String Sign (series 7, image 216). 4. The above were reviewed in person with Dr. Roland Rack on 09/09/2020 at 1128 hours. 5. 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 6. Bulky Left Carotid bifurcation and proximal left ICA calcified plaque without significant stenosis. 7. Up to moderate Right Vertebral Artery origin stenosis. Electronically Signed   By: Genevie Ann M.D.   On: 09/09/2020 11:46   DG Abd Portable 1V  Result Date: 09/09/2020 CLINICAL DATA:  Nasogastric tube placement EXAM: PORTABLE ABDOMEN - 1 VIEW COMPARISON:  None. FINDINGS: No nasogastric tube is identified within the visualized abdomen. The right flank and pelvis are excluded from view. The visualized abdominal gas pattern is unremarkable. IMPRESSION: Nasogastric tube not identified within the visualized distal esophagus and abdomen. Chest radiograph may be helpful to identify the location of a malpositioned catheter.  These results will be called to the ordering clinician or representative by the Radiologist Assistant, and communication documented in the PACS or [employer]. Electronically Signed   By: Fidela Salisbury MD   On: 09/09/2020 23:49   ECHOCARDIOGRAM COMPLETE  Result Date: 09/09/2020    ECHOCARDIOGRAM REPORT   Patient Name:   Frank [name] Date of Exam: 09/09/2020 Medical Rec #:  [id number]             Height:       69.0 in Accession #:    [id number]            Weight:       218.3 lb Date of Birth:  Nov 22, 1940             BSA:          2.144 m Patient Age:    3 years              BP:           100/45 mmHg Patient Gender: M                     HR:           70 bpm. Exam Location:  Inpatient Procedure: 2D Echo, Cardiac Doppler, Color Doppler and Intracardiac            Opacification Agent Indications:    Stroke  History:        Patient has prior history of Echocardiogram examinations, most                 recent 06/24/2017. Risk Factors:Sleep Apnea, Diabetes and Former                 Smoker. GERD.  Sonographer:    Clayton Lefort RDCS (AE) Referring Phys: Gordon  Sonographer Comments: Technically difficult study due to poor echo windows. Image acquisition challenging due to respiratory motion. IMPRESSIONS  1. Left ventricular ejection fraction, by estimation, is 65 to 70%. The left ventricle has normal function. The left ventricle has no regional wall motion abnormalities. There is severe concentric left ventricular hypertrophy. Left ventricular diastolic  parameters are consistent with Grade I diastolic dysfunction (impaired relaxation). Elevated left atrial pressure.  2. Right ventricular systolic function is normal. The right ventricular size is mildly enlarged. There is normal pulmonary artery systolic pressure.  3. The mitral valve is normal in structure. No evidence of mitral valve regurgitation. Mild mitral stenosis. The mean mitral valve gradient is 3.0 mmHg. Moderate mitral annular  calcification.  4. The aortic valve is normal in structure. There is severe calcifcation of the aortic valve. There is severe thickening of the aortic valve. Aortic valve regurgitation is not visualized. Mild aortic valve stenosis. Aortic valve mean gradient measures 9.0 mmHg.  5. The inferior vena cava is normal in size with greater than 50% respiratory variability, suggesting right atrial pressure of 3 mmHg. FINDINGS  Left Ventricle: Left ventricular ejection fraction, by estimation, is 65 to 70%. The left ventricle has normal function. The left ventricle has no regional wall motion abnormalities. Definity contrast agent was given IV to delineate the left ventricular  endocardial borders. The left ventricular internal cavity size was normal in size. There is severe concentric left ventricular hypertrophy. Left ventricular diastolic parameters are consistent with Grade I diastolic dysfunction (impaired relaxation). Elevated left atrial pressure. Right Ventricle: The right ventricular size is mildly enlarged. No increase in right ventricular wall thickness. Right ventricular systolic function is normal. There is normal pulmonary artery systolic pressure. Left Atrium: Left atrial size was normal in size. Right Atrium: Right atrial size was normal in size. Pericardium: There is no evidence of pericardial effusion. Mitral Valve: The mitral valve is normal in structure. There is moderate thickening of the mitral valve leaflet(s). There is moderate calcification of the mitral valve leaflet(s). Moderate mitral annular calcification. No evidence of mitral valve regurgitation. Mild mitral valve stenosis. MV peak gradient, 10.9 mmHg. The mean mitral valve gradient is 3.0 mmHg. Tricuspid Valve: The tricuspid valve is normal in structure. Tricuspid valve regurgitation is mild . No evidence of tricuspid stenosis. Aortic Valve: The aortic valve is normal in structure. There is severe calcifcation of the aortic valve. There is  severe thickening of the aortic valve. Aortic valve regurgitation is not visualized. Mild aortic stenosis is present. Aortic valve mean gradient measures 9.0 mmHg. Aortic valve peak gradient measures 15.7 mmHg. Aortic valve area, by VTI measures 1.81 cm. Pulmonic Valve: The pulmonic valve was normal in structure. Pulmonic valve regurgitation is not visualized. No evidence of pulmonic stenosis. Aorta: The aortic root is normal in size and structure. Venous: The inferior vena cava is normal in size with greater than 50% respiratory variability, suggesting right atrial pressure of 3 mmHg. IAS/Shunts: No atrial level shunt detected by color flow Doppler.  LEFT VENTRICLE PLAX 2D LVIDd:         4.70 cm  Diastology LVIDs:         3.30 cm  LV e' medial:    6.42 cm/s LV PW:         1.80 cm  LV E/e' medial:  15.5 LV IVS:        1.60 cm  LV e' lateral:   6.64 cm/s LVOT diam:     2.00 cm  LV  E/e' lateral: 15.0 LV SV:         71 LV SV Index:   33 LVOT Area:     3.14 cm  RIGHT VENTRICLE             IVC RV Basal diam:  3.50 cm     IVC diam: 1.60 cm RV Mid diam:    3.90 cm RV S prime:     11.70 cm/s TAPSE (M-mode): 2.4 cm LEFT ATRIUM             Index       RIGHT ATRIUM           Index LA diam:        3.60 cm 1.68 cm/m  RA Area:     17.70 cm LA Vol (A2C):   59.5 ml 27.75 ml/m RA Volume:   43.70 ml  20.38 ml/m LA Vol (A4C):   53.2 ml 24.81 ml/m LA Biplane Vol: 61.9 ml 28.87 ml/m  AORTIC VALVE AV Area (Vmax):    1.69 cm AV Area (Vmean):   1.83 cm AV Area (VTI):     1.81 cm AV Vmax:           198.33 cm/s AV Vmean:          137.000 cm/s AV VTI:            0.390 m AV Peak Grad:      15.7 mmHg AV Mean Grad:      9.0 mmHg LVOT Vmax:         107.00 cm/s LVOT Vmean:        79.800 cm/s LVOT VTI:          0.225 m LVOT/AV VTI ratio: 0.58  AORTA Ao Root diam: 3.20 cm Ao Asc diam:  3.20 cm MITRAL VALVE MV Area (PHT): 1.89 cm     SHUNTS MV Area VTI:   1.89 cm     Systemic VTI:  0.22 m MV Peak grad:  10.9 mmHg    Systemic Diam: 2.00  cm MV Mean grad:  3.0 mmHg MV Vmax:       1.65 m/s MV Vmean:      78.7 cm/s MV Decel Time: 401 msec MV E velocity: 99.50 cm/s MV A velocity: 175.00 cm/s MV E/A ratio:  0.57 Ena Dawley MD Electronically signed by Ena Dawley MD Signature Date/Time: 09/09/2020/10:39:50 PM    Final    IR PERCUTANEOUS ART THROMBECTOMY/INFUSION INTRACRANIAL INC DIAG ANGIO  Result Date: 09/11/2020 INDICATION: New onset left-sided numbness, left-sided neglect and confusion. Occluded right middle cerebral artery inferior division distal M2 M3 segment on CT angiogram of the head and neck. EXAM: 1. EMERGENT LARGE VESSEL OCCLUSION THROMBOLYSIS (anterior CIRCULATION) COMPARISON:  CT angiogram of the head and neck of September 09, 2020. MEDICATIONS: Vancomycin 1 g IV antibiotic was administered within 1 hour of the procedure. ANESTHESIA/SEDATION: General anesthesia CONTRAST:  Isovue 300 approximately 90 mL FLUOROSCOPY TIME:  Fluoroscopy Time: 55 minutes 12 seconds (3749 mGy). COMPLICATIONS: None immediate. TECHNIQUE: Following a full explanation of the procedure along with the potential associated complications, an informed witnessed consent was obtained from the patient's spouse. The risks of intracranial hemorrhage of 10%, worsening neurological deficit, ventilator dependency, death and inability to revascularize were all reviewed in detail with the patient's spouse. The patient was then put under general anesthesia by the Department of Anesthesiology at [facility]. The right groin was prepped and draped in the usual sterile fashion. Thereafter using modified  Seldinger technique, transfemoral access into the right common femoral artery was obtained without difficulty. Over a 0.035 inch guidewire an 8 French 25 cm Pinnacle sheath was inserted. Through this, and also over a 0.035 inch guidewire a 5 [place] JB 1 catheter was advanced to the aortic arch region and selectively positioned in the right common carotid artery and the  innominate artery. FINDINGS: The innominate arteriogram demonstrates patency of the right subclavian artery. The right vertebral artery demonstrates moderate tortuosity with wide patency. The vessel is seen to opacify to the cranial skull base. The right common carotid arteriogram demonstrates a complex heterogeneous partially calcified arteriosclerotic plaque extending from the distal common carotid artery into the right external carotid artery and the right internal carotid artery just distal to the bulb. Moderate to severe narrowing is seen of the right external carotid artery. Its branches are widely opacified. The right internal carotid artery at the bulb demonstrates approximately 70% stenosis associated with a prominent ulceration along the posterior wall, and also a reach of irregular atherosclerotic plaque along the lateral aspect associated with a 50% stenosis. More distally the right internal carotid artery is seen to opacify normally to the cranial skull base. The petrous, the cavernous and the supraclinoid segments are widely patent. The right middle cerebral artery demonstrates patency with occlusion of the inferior division of the right middle cerebral artery in the M2 M3 region with prominent area of hypoperfusion involving the anterior parietal posterior frontal regions. Venous phase is within normal limits. Hypoplastic right anterior cerebral artery is seen opacifying into the distal right anterior cerebral A2 segment. PROCEDURE: The diagnostic JB 1 catheter in the right common carotid artery was then exchanged over a 0.035 inch 300 cm Rosen exchange guidewire for an 087 balloon guide catheter which had been prepped with 50% contrast and 50% heparinized saline infusion. The guidewire was removed. Good aspiration obtained from the hub of the balloon guide catheter which was positioned just proximal to the origin of the right internal carotid artery. Control arteriogram was then performed which  demonstrated no change in the extracranial or intracranial circulation. Over a 0.014 inch standard Synchro micro guidewire with a J configuration, an 021 microcatheter was then advanced inside of an 071 36 cm Zoom aspiration catheter to the distal aspect of the balloon guide catheter. The micro guidewire and the microcatheter were advanced without event into the petrous horizontal segment. However, advancement of the Zoom guide catheter was met with resistance secondary to the irregular nature of the complex arteriosclerotic plaque in the carotid bulb region. This system was then removed. Over a 0.014 inch standard Synchro micro guidewire an 021 Trevo ProVue microcatheter was then advanced into the horizontal petrous segment. The guidewire was removed. Good aspiration obtained from the hub of the catheter. This in turn was exchanged for an 014 inch 300 cm Synchro exchange micro guidewire. The distal end of the micro guidewire was positioned in the horizontal petrous segment with a J configuration. Over the exchange micro guidewire, a 4/30 cm Viatrac 14 angioplasty balloon catheter which had been prepped with 50% contrast and 50% heparinized saline infusion and with heparinized saline infusion was advanced using the rapid exchange technique and positioned with the markers distally and proximally aligned appropriately. A control angioplasty was then performed using micro inflation syringe device via micro tubing. Balloon was expanded to just over 4 mm where it was maintained for a full 1 minute. Balloon was then deflated and retrieved and removed proximally. A control arteriogram  performed through the balloon guide demonstrated significantly improved caliber and flow through the angioplastied segment. Over the exchange micro guidewire, a combination of an 021 microcatheter inside of an 055 136 cm Zoom aspiration catheter was then advanced without difficulty to the petrous horizontal segment. The exchange micro  guidewire was removed. Good aspiration obtained from the microcatheter. Over a 0.014 inch standard Synchro micro guidewire with a J configuration, the combination of the microcatheter and micro guidewire was then advanced to the distal M1 segment. Using a torque device, the inferior division was then entered followed by the microcatheter and advanced distally to just proximal to the occluded prominent branch. The Zoom aspiration catheter was then advanced into the inferior division. Thereafter, attempts were made to advance the micro guidewire through the occluded inferior division in the distal M2 segment which was met with significant resistance. A different micro guidewire was then advanced and attempts were made to advance through the occluded segment. The micro guidewire eventually was able to be advanced distal to the occlusion, however, the microcatheter met with significant resistance. Micro guidewire and the microcatheter were retrieved and removed. An 035 aspiration catheter was then advanced over an 014 inch micro guidewire distal to the 72 Zoom aspiration catheter and advanced such that the micro guidewire and the 035 Zoom was in contact with the occluded segment of the inferior division. The 55 aspiration catheter was then advanced to be contact with the occluded firm clot. Thereafter, the micro guidewire was removed as was the 35 aspiration catheter due to lack of penetration through the occluded segment. Constant aspiration was then applied over about 3-4 minutes through the 55 aspiration catheter. The 55 Zoom aspiration catheter was then retrieved more proximally until there was free aspiration. Control arteriogram performed through the Zoom aspiration catheter demonstrated no change in the occluded dominant inferior division in the distal M2 segment. At this time, in a coaxial manner and with constant heparinized saline infusion, an 017 Headway microcatheter was then advanced over a 0.014 inch  standard Synchro micro guidewire distal to the 55 Zoom aspiration catheter which was in the distal right M1 segment. The micro guidewire was gently advanced with the microcatheter this time without any difficulty through the occlusion into the distal M2 M3 junction followed by the microcatheter. The micro guidewire was removed. Good aspiration obtained from the hub of the microcatheter which was then connected to continuous heparinized saline infusion. A Tiger 17 retrieval was then advanced and deployed in the usual manner from the proximal M3 to the proximal M2 segment. The aspiration catheter was then advanced to just at the level of the occluded inferior division. Aspiration then applied through the aspiration catheter for approximately 2 minutes. The Tiger device was expanded and deflated approximately 7 times. Thereafter, the combination of the retrieval device, and the microcatheter, and the Zoom microcatheter was then retrieved and removed. A control arteriogram performed through the balloon guide which had now been advanced into the distal right into the internal carotid artery demonstrated near complete revascularization of the previously occluded inferior division M2 M3 segment with a TICI 3 revascularization. There continued to be a filling defect at the junction of the previously occluded vessel, and side-branch. This was for approximately 20 minutes with progressive slightly decreased hemodynamic flow distal to the treated vessel. It was, therefore, elected to proceed with rescue stenting. Prior to this, the patient was loaded with aspirin 81 mg and and Brilinta 180 mg via an orogastric tube. CT of the  brain demonstrated mild hyperattenuation in the subarachnoid space in the sylvian fissure extending into the convexity of the posterior frontal and anterior parietal regions. The 55 Zoom aspiration catheter in combination with in 021 microcatheter over a 0.014 inch standard Synchro micro guidewire was  then advanced in combination into the distal M1 segment. Micro guidewire was then advanced without difficulty through the previously revascularized branch into the distal M3 region, followed by the microcatheter. The guidewire was removed. Good aspiration obtained from the microcatheter. Which the connected to continuous heparin saline infusion. It was elected to proceed with placement of a 4 mm x 24 mm Neuroform atlas stent. This was then advanced in a coaxial manner with constant heparinized saline infusion to the distal marker on the microcatheter. The O ring on the delivery microcatheter was loosened. With slight forward gentle traction with the right hand on the delivery micro guidewire with the right hand, the microcatheter was then unsheathed deploying the stent. The delivery micro guidewire and the microcatheter was gently retrieved more proximally. A control arteriogram performed through the 55 aspiration catheter and in the distal right internal carotid artery now demonstrates significantly improved caliber and flow through the entirety of the right middle cerebral artery inferior division. A TICI 3 revascularization was maintained. The right internal carotid artery continued to demonstrate excellent flow through the right middle and the right anterior cerebellar arteries. The balloon guide catheter was gently retrieved with the balloon guide catheter gently retrieved with the 55 aspiration catheter proximal to the right common carotid bifurcation. A control arteriogram performed through the balloon guide catheter in the right common carotid artery continued to demonstrate significantly improved caliber and flow through the previous angioplastjed segment of the right internal carotid artery at the bulb. There continued be approximately 50% stenosis. However, given the significant improved flow it was elected to proceed with stenting at this time. Intracranially there continued be no evidence of  intraluminal filling defects or of occlusions. No mass-effect was noted. The balloon guide was removed. The 8 French Pinnacle sheath was removed with successful hemostasis of the right groin puncture site with an 8 French Angio-Seal closure device. The right groin appeared soft. Distal pulses remained palpable unchanged prior to procedure. A repeat CT of the brain demonstrated marginally increased hyperattenuation in the posterior perisylvian, and the frontal parietal regions. There is no mass effect or midline shift. 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 The patient's general anesthesia was reversed and the patient was extubated without difficulty. Upon recovery, the patient was able to move all his fours adequately. He denied any headaches, nausea or vomiting. He continued to have facial asymmetry. However, his speech was relatively clear. He was then transferred to the neuro ICU for post revascularization management. IMPRESSION: Status post endovascular complete revascularization of a calcified arteriosclerotic plaque in the right inferior division of the right middle cerebral artery M2 M3 segment with 1 pass with contact aspiration, 1 pass with the Tiger 17 retrieval device and aspiration achieving a TICI 3 revascularization Placement of a rescue stent at the site of the occluded inferior division distal M2 M3 region with continued TICI 3 revascularization. PLAN: Follow-up in the clinic 2-4 weeks post discharge. Electronically Signed   By: Frank [name] M.D.   On: 09/10/2020 11:03   CT HEAD CODE STROKE WO CONTRAST  Result Date: 09/09/2020 CLINICAL DATA:  Code stroke. Neuro deficit, acute, stroke suspected. Additional history provided: Right-sided facial droop, last known well 9:15 a.m. EXAM: CT  HEAD WITHOUT CONTRAST TECHNIQUE: Contiguous axial images were obtained from the base of the skull through the vertex without intravenous contrast. COMPARISON:  Head CT  02/08/2020. FINDINGS: Brain: Mild cerebral atrophy. Redemonstrated subdural hematomas overlying the bilateral cerebral convexities. These are unchanged in size as compared to the prior head CT of 02/08/2020 again measuring up to 9 mm in thickness bilaterally. Although intermediate to low-density, the density of the right subdural hematoma has subtly increased as compared to the prior examination which may reflect some degree of interval hemorrhage into the collection. 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 Scattered small foci of calcification within both collections. Symmetric mass effect upon the underlying cerebral hemispheres without midline shift. Ill-defined hypoattenuation within the cerebral white matter is nonspecific, but compatible with chronic small vessel ischemic disease. No acute demarcated cortical infarct. No evidence of intracranial mass. Vascular: No hyperdense vessel.  Atherosclerotic calcifications. Skull: No calvarial fracture.  Left-sided cranioplasty. Sinuses/Orbits: Partial opacification of left ethmoid air cells. Background mild bilateral ethmoid and sphenoid sinus mucosal thickening. ASPECTS ([place] Stroke Program Early CT Score) - Ganglionic level infarction (caudate, lentiform nuclei, internal capsule, insula, M1-M3 cortex): 7 - Supraganglionic infarction (M4-M6 cortex): 3 Total score (0-10 with 10 being normal): 10 Dr. Kathrynn Speed aware of these findings at 11:20 a.m. on 09/09/2020 (discussed with Dr. Lars Pinks). IMPRESSION: Redemonstrated subdural hematomas overlying the bilateral cerebral convexities. As before, these hematomas measure up to 9 mm in greatest thickness. Although intermediate to low-density, the density of the right subdural hematoma has subtly increased from the examination of 02/08/2020, suggestive of some degree of interval hemorrhage into the collection. The left subdural hematoma remains low-density. Unchanged symmetric  mass effect upon the cerebral hemispheres without midline shift. No acute demarcated cortical infarct is identified.  ASPECTS is 10. Stable generalized atrophy of the brain and chronic small vessel ischemic disease. Electronically Signed   By: Kellie Simmering DO   On: 09/09/2020 11:35   CT ANGIO HEAD CODE STROKE  Result Date: 09/09/2020 CLINICAL DATA:  80 year old male code stroke. Neglect. Right parietal lobe implicated. History of unresolved subdural hematomas. EXAM: CT ANGIOGRAPHY HEAD AND NECK CT PERFUSION BRAIN TECHNIQUE: Multidetector CT imaging of the head and neck was performed using the standard protocol during bolus administration of intravenous contrast. Multiplanar CT image reconstructions and MIPs were obtained to evaluate the vascular anatomy. Carotid stenosis measurements (when applicable) are obtained utilizing NASCET criteria, using the distal internal carotid diameter as the denominator. Multiphase CT imaging of the brain was performed following IV bolus contrast injection. Subsequent parametric perfusion maps were calculated using RAPID software. CONTRAST:  155m OMNIPAQUE IOHEXOL 350 MG/ML SOLN COMPARISON:  Plain head CT 1105 hours today reported separately. Intracranial MRA 06/24/2017. FINDINGS: CT Brain Perfusion Findings: ASPECTS: 10 CBF (<30%) Volume: 169mPerfusion (Tmax>6.0s) volume: 4169m Hypoperfusion index 0.1 Mismatch Volume: 8m80mfarction Location:Posterior right MCA territory. CTA NECK Skeleton: Previous left superior frontal craniotomy. Chronic bilateral calvarium burr holes. No acute osseous abnormality identified. Cervical spine degeneration. Upper chest: Partially visible left chest cardiac pacemaker. Otherwise negative. Other neck: Mild motion artifact. No acute findings identified in the neck. Aortic arch: 3 vessel arch configuration. Relatively mild Calcified aortic atherosclerosis. Right carotid system: Mild calcified plaque of the brachiocephalic artery without stenosis.  Normal right CCA origin. Intermittent moderate calcified plaque in the right CCA proximal to the bifurcation, and at the bifurcation a radiographic string sign occurs at the ICA origin related to very bulky calcified plaque on series  5, image 108. Additional bulky plaque through the distal bulb, tandem stenosis on series 5, image 103. But the vessel remains patent. No additional stenosis to the skull base. Left carotid system: Mild left CCA origin but similar moderate left CCA and left carotid bifurcation through ICA bulb calcified plaque. On this side subsequent stenosis is less than 50 % with respect to the distal vessel. Vertebral arteries: Mild to moderate proximal right subclavian calcified plaque. Calcified plaque at the right vertebral artery origin with mild to moderate stenosis. Patent right vertebral then to the skull base. Mild proximal left subclavian and left vertebral artery origin calcified plaque without stenosis. Tortuous left V1 segment. Codominant left vertebral is otherwise normal to the skull base. CTA HEAD Posterior circulation: Negative distal vertebral arteries and basilar. The left V4 is mildly dominant. Normal PICA origins. Patent SCA and PCA origins. Small posterior communicating arteries. Mild left PCA P1 irregularity. Bilateral PCA branches are within normal limits. Anterior circulation: Both ICA siphons are patent. On the left cavernous segment calcified plaque results in no stenosis. Normal left ophthalmic and posterior communicating artery origins. On the right cavernous segment calcified plaque results in no significant stenosis. Patent carotid termini, MCA and ACA origins. Dominant left A1. 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 Otherwise the bilateral ACA branches are within normal limits. Left MCA M1 segment and bifurcation are patent. Left MCA branches appears table since 2018. Right MCA M1 and  bifurcation remain patent. Dominant posterior right MCA M2 branch is occluded at the level of punctate calcification on series 12, image 14. This is at a bifurcation where the superior branch remains patent. But the parent vessel is occluded. This calcification is new compared to last year. Venous sinuses: Patent. Anatomic variants: Dominant left ACA A1. Review of the MIP images confirms the above findings IMPRESSION: 1. Positive for emergent occlusion of a posterior Right MCA distal M2 branch at its bifurcation. Embolized calcification there is new since last year (series 12, image 14). 2. CT Perfusion detects a small infarct core of 13 mL with regional penumbra of 41 mL (28 mL mismatch). 3. Superimposed very bulky calcified plaque at the Right ICA origin with tandem HIGH-GRADE STENOSIS, including Radiographic String Sign (series 7, image 216). 4. The above were reviewed in person with Dr. Roland Rack on 09/09/2020 at 1128 hours. 5. 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 6. Bulky Left Carotid bifurcation and proximal left ICA calcified plaque without significant stenosis. 7. Up to moderate Right Vertebral Artery origin stenosis. Electronically Signed   By: Genevie Ann M.D.   On: 09/09/2020 11:46   CT ANGIO NECK CODE STROKE  Result Date: 09/09/2020 CLINICAL DATA:  80 year old male code stroke. Neglect. Right parietal lobe implicated. History of unresolved subdural hematomas. EXAM: CT ANGIOGRAPHY HEAD AND NECK CT PERFUSION BRAIN TECHNIQUE: Multidetector CT imaging of the head and neck was performed using the standard protocol during bolus administration of intravenous contrast. Multiplanar CT image reconstructions and MIPs were obtained to evaluate the vascular anatomy. Carotid stenosis measurements (when applicable) are obtained utilizing NASCET criteria, using the distal internal carotid diameter as the denominator. Multiphase CT imaging of the brain was performed  following IV bolus contrast injection. Subsequent parametric perfusion maps were calculated using RAPID software. CONTRAST:  136m OMNIPAQUE IOHEXOL 350 MG/ML SOLN COMPARISON:  Plain head CT 1105 hours today reported separately. Intracranial MRA 06/24/2017. FINDINGS: CT Brain Perfusion Findings:  ASPECTS: 10 CBF (<30%) Volume: 26m Perfusion (Tmax>6.0s) volume: 422m  Hypoperfusion index 0.1 Mismatch Volume: 2858mnfarction Location:Posterior right MCA territory. CTA NECK Skeleton: Previous left superior frontal craniotomy. Chronic bilateral calvarium burr holes. No acute osseous abnormality identified. Cervical spine degeneration. Upper chest: Partially visible left chest cardiac pacemaker. Otherwise negative. Other neck: Mild motion artifact. No acute findings identified in the neck. Aortic arch: 3 vessel arch configuration. Relatively mild Calcified aortic atherosclerosis. Right carotid system: Mild calcified plaque of the brachiocephalic artery without stenosis. Normal right CCA origin. Intermittent moderate calcified plaque in the right CCA proximal to the bifurcation, and at the bifurcation a radiographic string sign occurs at the ICA origin related to very bulky calcified plaque on series 5, image 108. Additional bulky plaque through the distal bulb, tandem stenosis on series 5, image 103. But the vessel remains patent. No additional stenosis to the skull base. Left carotid system: Mild left CCA origin but similar moderate left CCA and left carotid bifurcation through ICA bulb calcified plaque. On this side subsequent stenosis is less than 50 % with respect to the distal vessel. Vertebral arteries: Mild to moderate proximal right subclavian calcified plaque. Calcified plaque at the right vertebral artery origin with mild to moderate stenosis. Patent right vertebral then to the skull base. Mild proximal left subclavian and left vertebral artery origin calcified plaque without stenosis. Tortuous left V1 segment.  Codominant left vertebral is otherwise normal to the skull base. CTA HEAD Posterior circulation: Negative distal vertebral arteries and basilar. The left V4 is mildly dominant. Normal PICA origins. Patent SCA and PCA origins. Small posterior communicating arteries. Mild left PCA P1 irregularity. Bilateral PCA branches are within normal limits. Anterior circulation: Both ICA siphons are patent. On the left cavernous segment calcified plaque results in no stenosis. Normal left ophthalmic and posterior communicating artery origins. On the right cavernous segment calcified plaque results in no significant stenosis. Patent carotid termini, MCA and ACA origins. Dominant left A1. 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 Otherwise the bilateral ACA branches are within normal limits. Left MCA M1 segment and bifurcation are patent. Left MCA branches appears table since 2018. Right MCA M1 and bifurcation remain patent. Dominant posterior right MCA M2 branch is occluded at the level of punctate calcification on series 12, image 14. This is at a bifurcation where the superior branch remains patent. But the parent vessel is occluded. This calcification is new compared to last year. Venous sinuses: Patent. Anatomic variants: Dominant left ACA A1. Review of the MIP images confirms the above findings IMPRESSION: 1. Positive for emergent occlusion of a posterior Right MCA distal M2 branch at its bifurcation. Embolized calcification there is new since last year (series 12, image 14). 2. CT Perfusion detects a small infarct core of 13 mL with regional penumbra of 41 mL (28 mL mismatch). 3. Superimposed very bulky calcified plaque at the Right ICA origin with tandem HIGH-GRADE STENOSIS, including Radiographic String Sign (series 7, image 216). 4. The above were reviewed in person with Dr. McNRoland Rack 09/09/2020 at 1128 hours. 5. 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 6. Bulky Left Carotid bifurcation and proximal left ICA calcified plaque without significant stenosis. 7. Up to moderate Right Vertebral Artery origin stenosis. Electronically Signed   By: H  Genevie AnnD.   On: 09/09/2020 11:46    Labs:  CBC: Recent Labs    08/21/20 0945  09/09/20 1101 09/09/20 1103 09/10/20 0606 09/11/20 0440  WBC 5.8 6.2  --  8.2 7.6  HGB 11.8* 11.2* 12.2* 9.6* 9.0*  HCT 36.4* 34.8* 36.0* 28.9* 27.7*  PLT 142.0* 124*  --  143* 123*    COAGS: Recent Labs    09/09/20 1101  INR 1.0  APTT 32    BMP: Recent Labs    12/08/19 2250 12/12/19 1450 05/14/20 1329 08/21/20 0945 09/09/20 1101 09/09/20 1103 09/10/20 0606 09/11/20 0440  NA 138   < > 137 139 133* 136 136 137  K 4.6   < > 4.0 4.6 4.7 4.9 4.6 4.5  CL 102   < > 106 106 100 100 106 108  CO2 25   < > _0 --  23 18*  GLUCOSE 136*   < > 166* 97 163* 158* 182* 201*  BUN 41*   < > 36* 37* 35* 47* 34* 34*  CALCIUM 8.9   < > 7.8* 9.3 8.8*  --  8.2* 8.2*  CREATININE 2.30*   < > 1.62* 1.74* 1.90* 1.80* 1.98* 1.85*  GFRNONAA 26*   < > 42*  --  35*  --  33* 36*  GFRAA 30*  --   --   --   --   --   --   --    < > = values in this interval not displayed.    LIVER FUNCTION TESTS: Recent Labs    08/21/20 0945 09/09/20 1101  BILITOT 0.4 0.7  AST 15 22  ALT 18 22  ALKPHOS 73 69  PROT 6.6 6.4*  ALBUMIN 3.9 3.6    Assessment and Plan:  History of acute CVA s/p cerebral arteriogram with emergent mechanical thrombectomy and stent placement of right MCA inferior division M2/M3 occlusion, along with angioplasty of right ICA stenosis, achieving a TICI 3 revascularization via right femoral approach 09/09/2020 by Dr. Estanislado [name]. Patient's condition stable- drowsy and intermittently follows simple commands, can spontaneously move all extremities with weakness of left side. Right femoral puncture site stable, distal pulses (DPs) 1+ bilaterally. Continue  taking Brilinta 45 mg twice daily and Aspirin 81 mg once daily. Plan to follow-up with Dr. Estanislado [name] in clinic 1 month after discharge (NIR schedulers to call patient to set up this appointment). Further plans per neurology- appreciate and agree with management. NIR to follow.   Electronically Signed: Earley Abide, PA-C 09/11/2020, 11:45 AM   I spent a total of 15 Minutes at the the patient's bedside AND on the patient's hospital floor or unit, greater than 50% of which was counseling/coordinating care for CVA s/p revascularization.

## 2020-09-11 NOTE — Progress Notes (Addendum)
Initial Nutrition Assessment  DOCUMENTATION CODES:  Obesity unspecified  INTERVENTION:  Initiate tube feeding via Cortrak: Osmolite 1.5 at 55 ml/h (1320 ml per day) Prosource TF 45 ml TID  Provides 2100 kcal, 115 gm protein, 1003 ml free water daily  NUTRITION DIAGNOSIS:  Inadequate oral intake related to inability to eat,lethargy/confusion as evidenced by NPO status.  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 3/2 - Cortrak tube placed (tip in gastric)  Spoke with wife at bedside. Reports that he loves to eat and was eating three meals a day before his stroke. Breakfast of eggs, toast, sometimes bacon. Lunch of a deli sandwich and soup. Dinner of a starch, vegetable, and protein. Per wife, he has a hidden "snack drawer" with cookies, pretzels, etc.  With his cardiologist, he was advised to lose weight, so all weight loss has been intentional from the last 3 weeks.  Relevant Medications: heparin, novolog, cleviprex @15  ml/hr,  Labs: reviewed; Glucose 173  NUTRITION - FOCUSED PHYSICAL EXAM: Flowsheet Row Most Recent Value  Orbital Region Mild depletion  Upper Arm Region No depletion  Thoracic and Lumbar Region No depletion  Buccal Region Mild depletion  Temple Region Mild depletion  Clavicle Bone Region No depletion  Clavicle and Acromion Bone Region No depletion  Scapular Bone Region No depletion  Dorsal Hand No depletion  Patellar Region No depletion  Anterior Thigh Region No depletion  Posterior Calf Region Mild depletion  Edema (RD Assessment) Mild  [some swelling in right hand]  Hair Reviewed  [bald]  Eyes Unable to assess  Mouth Unable to  assess  Skin Reviewed  Nails Reviewed  [pale nail beds]     Diet Order:   Diet Order            Diet NPO time specified  Diet effective now                EDUCATION NEEDS:  No education needs have been identified at this time  Skin:  Skin Assessment: Reviewed RN Assessment  Last BM:  PTA  Height:  Ht Readings from Last 1 Encounters:  09/09/20 5\' 9"  (1.753 m)   Weight:  Wt Readings from Last 1 Encounters:  09/09/20 99 kg   Ideal Body Weight:  72.7 kg  BMI:  Body mass index is 32.23 kg/m.  Estimated Nutritional Needs:  Kcal:  1900-2100 Protein:  95-110 grams Fluid:  >2 L  Derrel Nip, RD, LDN Registered Dietitian After Hours/Weekend Pager # [phone]

## 2020-09-11 NOTE — Progress Notes (Signed)
Inpatient Diabetes Program Recommendations  AACE/ADA: New Consensus Statement on Inpatient Glycemic Control (2015)  Target Ranges:  Prepandial:   less than 140 mg/dL      Peak postprandial:   less than 180 mg/dL (1-2 hours)      Critically ill patients:  140 - 180 mg/dL   Lab Results  Component Value Date   GLUCAP 204 (H) 09/11/2020   HGBA1C 7.8 (H) 09/10/2020    Review of Glycemic Control  Results for YOREL, REDDER" (MRN [id number]) as of 09/11/2020 11:56  Ref. Range 09/11/2020 08:29 09/11/2020 11:43  Glucose-Capillary Latest Ref Range: 70 - 99 mg/dL 214 (H) 204 (H)   Diabetes history: DM2 Outpatient Diabetes medications: Toujeo 10 units daily, Glipizide 10 mg daily Current orders for Inpatient glycemic control: Novolog 0-15 units Q4H  Inpatient Diabetes Program Recommendations:    Please consider Lantus 5 units QHS (50% of home dose).    Will continue to follow while inpatient.  Thank you, Reche Dixon, RN, BSN Diabetes Coordinator Inpatient Diabetes Program [phone] (team pager from 8a-5p)

## 2020-09-11 NOTE — Procedures (Signed)
Cortrak  Tube Location:  Left nare Initial Placement:  Stomach Secured by: Bridle Technique Used to Measure Tube Placement:  Documented cm marking at nare/ corner of mouth Cortrak Secured At:  75 cm    Cortrak Tube Team Note:  Consult received to place a Cortrak feeding tube.   No x-ray is required. RN may begin using tube.   If the tube becomes dislodged please keep the tube and contact the Cortrak team at [email or web address] (password [email or web address]) for replacement.  If after hours and replacement cannot be delayed, place a NG tube and confirm placement with an abdominal x-ray.    Koleen Distance MS, RD, LDN Please refer to [facility] for RD and/or RD on-call/weekend/after hours pager

## 2020-09-11 NOTE — Progress Notes (Signed)
STROKE TEAM PROGRESS NOTE   INTERVAL HISTORY His wife is at the bedside.   Patient remains lethargic and barely arousable and follows commands.  Long discussion with wife at the bedside about his prognosis and she agrees to limited code with no intubation okay to do cardiac compressions and CPR. Limited code status: no intubation/mechanical ventilation, oky for CPR, ALS med, defib Vitals:   09/11/20 1100 09/11/20 1115 09/11/20 1130 09/11/20 1200  BP: (!) 116/44     Pulse: 85 84 84   Resp: (!) 21 20 19    Temp:    98.8 F (37.1 C)  TempSrc:    Axillary  SpO2: 96% 95% 95%   Weight:      Height:       CBC:  Recent Labs  Lab 09/09/20 1101 09/09/20 1103 09/10/20 0606 09/11/20 0440  WBC 6.2  --  8.2 7.6  NEUTROABS 3.9  --  6.7  --   HGB 11.2*   < > 9.6* 9.0*  HCT 34.8*   < > 28.9* 27.7*  MCV 84.1  --  82.6 85.2  PLT 124*  --  143* 123*   < > = values in this interval not displayed.   Basic Metabolic Panel:  Recent Labs  Lab 09/10/20 0606 09/11/20 0440 09/11/20 1112  NA 136 137 137  K 4.6 4.5  --   CL 106 108  --   CO2 23 18*  --   GLUCOSE 182* 201*  --   BUN 34* 34*  --   CREATININE 1.98* 1.85*  --   CALCIUM 8.2* 8.2*  --   MG  --  2.0  --   PHOS  --  4.0  --    Lipid Panel:  Recent Labs  Lab 09/10/20 0606  CHOL 107  TRIG 210*  HDL 36*  CHOLHDL 3.0  VLDL 42*  LDLCALC 29   HgbA1c:  Recent Labs  Lab 09/10/20 0606  HGBA1C 7.8*   Urine Drug Screen: pending  Result Date 09/10/2020 Mri brain/ MRA head 1. Large acute/subacute left-sided infarct with parenchymal hemorrhage measuring over 4 cm. PH2 hemorrhage. 2. Element of acute right subarachnoid hemorrhage. 3. Chronic bilateral subdural hematomas. 4. No significant midline shift. IMAGING past 24 hours CT HEAD WO CONTRAST  Result Date: 09/10/2020 IMPRESSION:  1. Increased subarachnoid hemorrhage/contrast around the right sylvian fissure. There is also some interval parenchymal hemorrhage in the infarct  territory with small volume intraventricular extension. No hydrocephalus or midline shift.  2. Right perisylvian cortical infarct correlating with affected vessel and the perfusion maps.  3. Chronic subdural hematomas.    DG Abd Portable 1V  Result Date: 09/09/2020 Nasogastric tube not identified within the visualized distal esophagus and abdomen. Chest radiograph may be helpful to identify the location of a malpositioned catheter.    Result Date: 09/09/2020 ECHOCARDIOGRAM REPORT   1. Left ventricular ejection fraction, by estimation, is 65 to 70%. The left ventricle has normal function. The left ventricle has no regional wall motion abnormalities. There is severe concentric left ventricular hypertrophy. Left ventricular diastolic  parameters are consistent with Grade I diastolic dysfunction (impaired relaxation). Elevated left atrial pressure.   2. Right ventricular systolic function is normal. The right ventricular size is mildly enlarged. There is normal pulmonary artery systolic pressure.   3. The mitral valve is normal in structure. No evidence of mitral valve regurgitation. Mild mitral stenosis. The mean mitral valve gradient is 3.0 mmHg. Moderate mitral annular calcification.   4. The  aortic valve is normal in structure. There is severe calcifcation of the aortic valve. There is severe thickening of the aortic valve. Aortic valve regurgitation is not visualized. Mild aortic valve stenosis. Aortic valve mean gradient measures 9.0 mmHg.   5. The inferior vena cava is normal in size with greater than 50% respiratory variability, suggesting right atrial pressure of 3 mmHg.    CT ANGIO Head/neck CT perfusion CODE STROKE  Result Date: 09/09/2020  IMPRESSION:  1. Positive for emergent occlusion of a posterior Right MCA distal M2 branch at its bifurcation. Embolized calcification there is new since last year (series 12, image 14).  2. CT Perfusion detects a small infarct core of 13 mL with  regional penumbra of 41 mL (28 mL mismatch).  3. Superimposed very bulky calcified plaque at the Right ICA origin with tandem HIGH-GRADE STENOSIS, including Radiographic String Sign (series 7, image 216).  4. 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  5. Bulky Left Carotid bifurcation and proximal left ICA calcified plaque without significant stenosis.  6. Up to moderate Right Vertebral Artery origin stenosis.     PHYSICAL EXAM Obese elderly Caucasian male not in distress. . Afebrile. Head is nontraumatic. Neck is supple without bruit.    Cardiac exam no murmur or gallop. Lungs are clear to auscultation. Distal pulses are well felt. Neurological Exam : Patient is drowsy but can be barely aroused.  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  Follows simple midline and one-step commands.  He has significant dysarthria and has right gaze preference but able to look to the left of midline.  He blinks to threat on the right but not on the left.  Mild left lower facial weakness.  Tongue midline.  Motor system exam shows left hemiparesis with 2/5 strength and able to lift arm and leg off the bed unable to sustain the response..  Sensation is diminished in the left compared to the right .mild left hemineglect   ASSESSMENT/PLAN [name] is a 80 y.o. 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  Was found to have a right MCA M2 branch occlusion.  .   Stroke secondary to occluded right MCA due to calcified thrombus s/p IR revascularization of occluded dominant right MCA inferior division distal M2/3 with placement of stent and Right ICA balloon angioplasty  Code Stroke CT head No acute demarcated cortical infarct is identified.  ASPECTS is 10.    CTA  head cclusion of a posterior Right MCA distal M2 branch at its bifurcation.  CTA neck right ICA origin with tandem HIGH-GRADE STENOSIS  CT perfusion small infarct core of 13 mL with regional penumbra of 41 mL (28 mL mismatch).   MRI/MRA h: Large acute/subacute left-sided infarct with parenchymal       hemorrhage measuring over 4 cm. PH2 hemorrhage.  Element of acute right subarachnoid hemorrhage.  Chronic bilateral subdural hematomas.  2D Echo EF 65-70%, severe LVH, Grade I diastolic dysfunction  LDL 29  HgbA1c 7.8  VTE prophylaxis - SCDs, Heparin 5000units q 8hr NPO: speech consulted  No antithrombotic prior to admission, now on aspirin 81 mg daily and ticagrelor 45mg  daily (reduced secondary to [facility])  Therapy recommendations:  PT: CIR  Disposition:  Pending  follow-up with Dr. Estanislado Pandy  in clinic 1 month after discharge  Subarachnoid Hemorrhage  S/p Thrombectomy, stent placement distal M2, balloon angioplasty  CT head: Increased subarachnoid hemorrhage the right sylvian fissure. parenchymal hemorrhage in the infarct territory with small volume intraventricular extension.   SBP goal changed to <160  Normal saline 147ml/hr  Cerebral edema  Start 3% sodium chloride at 23ml/hr   Follow sodium q 6 hrs  Sodium goal 150-155  Dysphagia  Likely due to stroke  Failed swallow eval  Cortrak placed today  Seizure history  Previously on Keppra due to seizures but eventually weaned off  Will decrease Keppra to 500mg  bid for prophylaxis due to somnolence  EEG (3/2): suggestive of cortical dysfunction in right hemisphere likely secondary to underlying infarct.  There is also evidence of breach artifact in left centro- temporal region consistent with prior craniotomy.  Additionally there is evidence of moderate diffuse encephalopathy, nonspecific etiology.  No seizures or definite epileptiform discharges were seen throughout the recording.  Hypertension  Home meds:   Amlodipine 10mg  daily, Cadura 2mg  at bedtime, Micardis 20mg  daily  Unstable, requiring Cleviprex infusion, wean today  Add clonodine patch today  SBP goal <160 in the setting of SAH, ICH . hydralazine 20mg  q6 hours, labetolol 20 q2 prn . Long-term BP goal normotensive  Hyperlipidemia  Home meds: atorvastatin 20 daily  LDL 29, goal < 70  Started on atorvastatin daily as he is daily   Continue statin at discharge  Diabetes type II Uncontrolled  Home meds:  Glipizide 10mg  daily, glargine 10units daily  HgbA1c 7.8, goal < 7.0  CBGs Recent Labs    09/11/20 0401 09/11/20 0829 09/11/20 1143  GLUCAP 173* 214* 204*      SSI  Add lantus 5 units qHS  Chronic Kidney Injury III  Likely due to contrast load for imaging/IR and dehydration  Creatinine level 1.98 -> 1.85  Adequate uop  Normal saline bolus 550ml  NS 199ml/hr  Follow uop closely  Other Stroke Risk Factors  Advanced Age >/= 22   Obesity, Body mass index is 32.23 kg/m., BMI >/= 30 associated with increased stroke risk, recommend weight loss, diet and exercise as appropriate   Coronary artery disease AV block, Mobitz 2, pacer  Obstructive sleep apnea, unclear if he is on CPAP at home  Other Active Problems  Anxiety/depression: prozac  GERD: protonix  Anemia: iron deficient and B12 deficient  Hospital day # 2 Patient appears to be have neurological decline likely due to cytotoxic edema from his right parenchymal hematoma and subarachnoid hemorrhage.  Recommend start hypertonic saline at 75 cc an hour with sodium goal 150-135.  IV hydration due to borderline worsening kidney function.  Keep n.p.o. for now.  Long discussion with wife about prognosis and plan of care.  She is realistic and agrees with limited code with no intubation or cardiac resuscitation if necessary. This patient is critically ill and at significant risk of neurological worsening, death and care requires constant monitoring of  vital signs, hemodynamics,respiratory and cardiac monitoring, extensive review of multiple databases, frequent neurological assessment, discussion with family, other specialists and medical decision making of high complexity.I have made any additions or clarifications directly to the above note.This critical care time does not reflect procedure time, or teaching time or supervisory time of PA/NP/Med Resident etc but could involve care discussion time.  I spent 30 minutes of neurocritical care time  in the care of  this patient.   Antony Contras, MD Patient To contact Stroke Continuity provider,  please refer to [email or web address] After hours, contact General Neurology

## 2020-09-11 NOTE — Care Management (Signed)
Transitions of Care Team following this patient for discharge planning.  Currently, patient not medically stable; will continue to follow as patient progresses.    Reinaldo Raddle, RN, BSN  Trauma/Neuro ICU Case Manager [phone]

## 2020-09-11 NOTE — Progress Notes (Signed)
Inpatient Rehab Admissions Coordinator:   Met with patienand wife t at bedside to discuss potential CIR admission. Pt.'s s wife stated interest. Will pursue for potential admit next week, pending bed availability, medical readiness, and insurance auth.   Clemens Catholic, [place], [place] Admissions Coordinator  [phone] ([place]) [phone] (office)

## 2020-09-11 NOTE — Progress Notes (Signed)
Physical Therapy Treatment Patient Details Name: Frank [name] MRN: [id number] DOB: [birth year] Today's Date: 09/11/2020    History of Present Illness Patient is a 80 y/o male who presents with dizziness, confusion, right weakness and right facial droop. NIH:4. 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 Repeat Head CT 3/1- increased SAH in right perisylvian fissure and parenchymal hemorrhage in the infarct territory with small volume intraventricular extension. MRI pending. PMH includes TBI, SDH, CKD, OSA, neuropathy, HTN, DM, AV block s/p PPM.    PT Comments    Pt lethargic, but participative and impulsive.  Emphasis on transitions, sit to stand x6, transfers toward and away from weak side and sitting balance.   Follow Up Recommendations  CIR;Supervision for mobility/OOB;Supervision/Assistance - 24 hour     Equipment Recommendations  None recommended by PT    Recommendations for Other Services Rehab consult     Precautions / Restrictions Precautions Precautions: Fall Precaution Comments: watch o2    Mobility  Bed Mobility Overal bed mobility: Needs Assistance Bed Mobility: Rolling;Supine to Sit Rolling: Mod assist   Supine to sit: Max assist;HOB elevated     General bed mobility comments: pt requires step by step assist to bring LE toward L side. Pt needs hand over hand to look toward L side. Pt requires (A) to elevate trunk off bed surface. Pt static sitting Mod with L lateral lean    Transfers Overall transfer level: Needs assistance Equipment used: 2 person hand held assist Transfers: Sit to/from [facility] Sit to Stand: +2 physical assistance;Mod assist Stand pivot transfers: +2 physical assistance;Mod assist       General transfer comment: Pt transfering L side was unable to step toward the R side. pt requires total +2 Mod to swing hips toward chair.  Pt sit<>stand x6 this session  Ambulation/Gait                 Stairs             Wheelchair Mobility    Modified Rankin (Stroke Patients Only) Modified Rankin (Stroke Patients Only) Pre-Morbid Rankin Score: Moderate disability Modified Rankin: Moderately severe disability     Balance Overall balance assessment: Needs assistance Sitting-balance support: Bilateral upper extremity supported;Feet supported Sitting balance-Leahy Scale: Poor Sitting balance - Comments: L lateral lean   Standing balance support: Single extremity supported;During functional activity Standing balance-Leahy Scale: Poor Standing balance comment: reliant on external support                            Cognition Arousal/Alertness: Awake/alert Behavior During Therapy: Flat affect;Impulsive Overall Cognitive Status: Difficult to assess Area of Impairment: Following commands;Awareness;Safety/judgement                       Following Commands: Follows one step commands inconsistently;Follows one step commands with increased time Safety/Judgement: Decreased awareness of safety;Decreased awareness of deficits Awareness: Intellectual   General Comments: pt following 1 step commands inconsistently.pt expressed need to go to bathroom but not voiding with standing      Exercises      General Comments General comments (skin integrity, edema, etc.): vss, Rn present during the transfer      Pertinent Vitals/Pain Pain Assessment: No/denies pain    Home Living Family/patient expects to be discharged to:: Private residence Living Arrangements: Spouse/significant other;Other (Comment) Available Help at Discharge: Family;Personal  care attendant;Other (Comment) Type of Home: House Home Access: Stairs to enter Entrance Stairs-Rails: Can reach both Home Layout: One level Home Equipment: Walker - 2 wheels;Shower seat;Grab bars - tub/shower;Grab bars - toilet;Cane - single  point;Walker - 4 wheels;Wheelchair - manual      Prior Function Level of Independence: Independent with assistive device(s)      Comments: Does own ADLs. Has a caregiver coming 5 days/week from 8-4 pm. Just got rid of night time nurse recently. Going to [facility] twice/week for balance. Does not drive. Goes out to lunch with friends. Doing really well per wife.   PT Goals (current goals can now be found in the care plan section) Acute Rehab PT Goals Patient Stated Goal: wants water PT Goal Formulation: With patient/family Time For Goal Achievement: 09/24/20 Potential to Achieve Goals: Good Progress towards PT goals: Progressing toward goals    Frequency    Min 4X/week      PT Plan Current plan remains appropriate    Co-evaluation PT/OT/SLP Co-Evaluation/Treatment: Yes Reason for Co-Treatment: For patient/therapist safety PT goals addressed during session: Mobility/safety with mobility OT goals addressed during session: ADL's and self-care;Proper use of Adaptive equipment and DME;Strengthening/ROM      AM-PAC PT "6 Clicks" Mobility   Outcome Measure  Help needed turning from your back to your side while in a flat bed without using bedrails?: A Little Help needed moving from lying on your back to sitting on the side of a flat bed without using bedrails?: A Lot Help needed moving to and from a bed to a chair (including a wheelchair)?: A Lot Help needed standing up from a chair using your arms (e.g., wheelchair or bedside chair)?: A Lot Help needed to walk in hospital room?: A Lot Help needed climbing 3-5 steps with a railing? : Total 6 Click Score: 12    End of Session   Activity Tolerance: Patient tolerated treatment well Patient left: in chair;with call bell/phone within reach;with chair alarm set;Other (comment) (with maxisky lift pad.) Nurse Communication: Mobility status PT Visit Diagnosis: Hemiplegia and hemiparesis;Difficulty in walking, not elsewhere classified  (R26.2);Unsteadiness on feet (R26.81) Hemiplegia - Right/Left: Left Hemiplegia - dominant/non-dominant: Non-dominant Hemiplegia - caused by: Cerebral infarction     Time: 6503-5465 PT Time Calculation (min) (ACUTE ONLY): 25 min  Charges:  $Neuromuscular Re-education: 8-22 mins                     09/11/2020  Ginger Carne., PT Acute Rehabilitation Services [phone]  (pager) [phone]  (office)   Tessie Fass Abimelec Grochowski 09/11/2020, 3:55 PM

## 2020-09-11 NOTE — Procedures (Signed)
Patient Name: Frank [name]  MRN: [id number]  Epilepsy Attending: Lora Havens  Referring Physician/Provider: Tollie Eth, NP Date: 09/11/2020 Duration: 24.37 mins  Patient history: 80 year old male with history of seizures, presented with altered mental status. EEG to evaluate for seizures.  Level of alertness: Awake,asleep  AEDs during EEG study: Keppra  Technical aspects: This EEG study was done with scalp electrodes positioned according to the 10-20 International system of electrode placement. Electrical activity was acquired at a sampling rate of 500Hz  and reviewed with a high frequency filter of 70Hz  and a low frequency filter of 1Hz . EEG data were recorded continuously and digitally stored.   Description: No clear posterior dominant rhythm was seen.  Sleep was characterized by vertex waves, sleep spindles (12 to 14 Hz), maximal frontocentral region.  EEG showed continuous 5 to 8 Hz theta-alpha activity in left hemisphere admixed with 15 to 18 Hz beta activity in left central temporal region consistent with breach artifact.  There is also 3 to 5 Hz theta-delta slowing in right hemisphere. Sharp transients were seen in right> left temporal region.  Hyperventilation and photic stimulation were not performed.     ABNORMALITY -Continuous slow, generalized and lateralized right hemisphere -Breach artifact, left centro- temporal region  IMPRESSION: This study is suggestive of cortical dysfunction in right hemisphere likely secondary to underlying infarct.  There is also evidence of breach artifact in left centro- temporal region consistent with prior craniotomy.  Additionally there is evidence of moderate diffuse encephalopathy, nonspecific etiology.  No seizures or definite epileptiform discharges were seen throughout the recording.   If suspicion for interictal activity remains a concern, a prolonged study can be considered.     Esha Fincher Barbra Sarks

## 2020-09-11 NOTE — Progress Notes (Signed)
SLP Cancellation Note  Patient Details Name: Frank [name] MRN: [id number] DOB: March 24, 1941   Cancelled treatment:       Reason Eval/Treat Not Completed: Medical issues which prohibited therapy;Fatigue/lethargy limiting ability to participate;Patient at procedure or test/unavailable;Patient's level of consciousness. Consult with RN reveals pt had neuro change yesterday, with seizure activity suspected. Pt currently lethargic, having EEG and Cortrak placed at this time. Will defer PO trials and completion of SLE at this time, and continue efforts as pt status improves.  Kylii Ennis B. Quentin Ore, [facility], [place] Speech Language Pathologist Office: [phone] Pager: [phone]  Shonna Chock 09/11/2020, 11:55 AM

## 2020-09-12 ENCOUNTER — Inpatient Hospital Stay (HOSPITAL_COMMUNITY): Payer: Medicare Other

## 2020-09-12 DIAGNOSIS — I63512 Cerebral infarction due to unspecified occlusion or stenosis of left middle cerebral artery: Secondary | ICD-10-CM | POA: Diagnosis not present

## 2020-09-12 DIAGNOSIS — I6601 Occlusion and stenosis of right middle cerebral artery: Secondary | ICD-10-CM | POA: Diagnosis not present

## 2020-09-12 LAB — GLUCOSE, CAPILLARY
Glucose-Capillary: 152 mg/dL — ABNORMAL HIGH (ref 70–99)
Glucose-Capillary: 182 mg/dL — ABNORMAL HIGH (ref 70–99)
Glucose-Capillary: 218 mg/dL — ABNORMAL HIGH (ref 70–99)
Glucose-Capillary: 225 mg/dL — ABNORMAL HIGH (ref 70–99)
Glucose-Capillary: 229 mg/dL — ABNORMAL HIGH (ref 70–99)
Glucose-Capillary: 253 mg/dL — ABNORMAL HIGH (ref 70–99)

## 2020-09-12 LAB — SODIUM
Sodium: 147 mmol/L — ABNORMAL HIGH (ref 135–145)
Sodium: 147 mmol/L — ABNORMAL HIGH (ref 135–145)
Sodium: 152 mmol/L — ABNORMAL HIGH (ref 135–145)

## 2020-09-12 LAB — BASIC METABOLIC PANEL
Anion gap: 9 (ref 5–15)
BUN: 29 mg/dL — ABNORMAL HIGH (ref 8–23)
CO2: 20 mmol/L — ABNORMAL LOW (ref 22–32)
Calcium: 8.2 mg/dL — ABNORMAL LOW (ref 8.9–10.3)
Chloride: 116 mmol/L — ABNORMAL HIGH (ref 98–111)
Creatinine, Ser: 1.47 mg/dL — ABNORMAL HIGH (ref 0.61–1.24)
GFR, Estimated: 48 mL/min — ABNORMAL LOW (ref >60)
Glucose, Bld: 228 mg/dL — ABNORMAL HIGH (ref 70–99)
Potassium: 4.1 mmol/L (ref 3.5–5.1)
Sodium: 145 mmol/L (ref 135–145)

## 2020-09-12 LAB — CBC
HCT: 29.9 % — ABNORMAL LOW (ref 39.0–52.0)
Hemoglobin: 9.1 g/dL — ABNORMAL LOW (ref 13.0–17.0)
MCH: 26.2 pg (ref 26.0–34.0)
MCHC: 30.4 g/dL (ref 30.0–36.0)
MCV: 86.2 fL (ref 80.0–100.0)
Platelets: 113 10*3/uL — ABNORMAL LOW (ref 150–400)
RBC: 3.47 MIL/uL — ABNORMAL LOW (ref 4.22–5.81)
RDW: 13.8 % (ref 11.5–15.5)
WBC: 6.6 10*3/uL (ref 4.0–10.5)
nRBC: 0 % (ref 0.0–0.2)

## 2020-09-12 LAB — MAGNESIUM
Magnesium: 1.7 mg/dL (ref 1.7–2.4)
Magnesium: 1.8 mg/dL (ref 1.7–2.4)

## 2020-09-12 LAB — PHOSPHORUS
Phosphorus: 3.3 mg/dL (ref 2.5–4.6)
Phosphorus: 3.4 mg/dL (ref 2.5–4.6)

## 2020-09-12 MED ORDER — AMLODIPINE BESYLATE 10 MG PO TABS
10.0000 mg | ORAL_TABLET | Freq: Every day | ORAL | Status: DC
  Filled 2020-09-12: qty 1

## 2020-09-12 MED ORDER — IPRATROPIUM-ALBUTEROL 0.5-2.5 (3) MG/3ML IN SOLN
3.0000 mL | RESPIRATORY_TRACT | Status: DC | PRN
  Administered 2020-09-12: 3 mL via RESPIRATORY_TRACT
  Filled 2020-09-12: qty 3

## 2020-09-12 MED ORDER — LABETALOL HCL 5 MG/ML IV SOLN
40.0000 mg | INTRAVENOUS | Status: DC | PRN
  Administered 2020-09-12: 40 mg via INTRAVENOUS
  Filled 2020-09-12: qty 8

## 2020-09-12 MED ORDER — FUROSEMIDE 10 MG/ML IJ SOLN
INTRAMUSCULAR | Status: AC
  Administered 2020-09-12: 40 mg
  Filled 2020-09-12: qty 4

## 2020-09-12 MED ORDER — PANTOPRAZOLE SODIUM 40 MG PO PACK
40.0000 mg | PACK | Freq: Every day | ORAL | Status: DC
  Administered 2020-09-12 – 2020-09-17 (×6): 40 mg
  Filled 2020-09-12 (×6): qty 20

## 2020-09-12 MED ORDER — SODIUM CHLORIDE 0.9 % IV SOLN
2.0000 g | Freq: Two times a day (BID) | INTRAVENOUS | Status: DC
  Administered 2020-09-12 – 2020-09-13 (×2): 2 g via INTRAVENOUS
  Filled 2020-09-12 (×2): qty 2

## 2020-09-12 MED ORDER — LABETALOL HCL 5 MG/ML IV SOLN
20.0000 mg | INTRAVENOUS | Status: DC | PRN
  Administered 2020-09-13 – 2020-09-14 (×5): 20 mg via INTRAVENOUS
  Filled 2020-09-12 (×2): qty 4
  Filled 2020-09-12: qty 8

## 2020-09-12 MED ORDER — DEXTROMETHORPHAN POLISTIREX ER 30 MG/5ML PO SUER
30.0000 mg | Freq: Two times a day (BID) | ORAL | Status: DC | PRN
  Administered 2020-09-12 – 2020-09-18 (×4): 30 mg
  Filled 2020-09-12 (×5): qty 5

## 2020-09-12 MED ORDER — AMLODIPINE BESYLATE 10 MG PO TABS
10.0000 mg | ORAL_TABLET | Freq: Every day | ORAL | Status: DC
  Administered 2020-09-12 – 2020-09-17 (×6): 10 mg
  Filled 2020-09-12 (×5): qty 1

## 2020-09-12 MED ORDER — FUROSEMIDE 10 MG/ML IJ SOLN: 40.0000 mg | Freq: Once | INTRAMUSCULAR | Status: AC

## 2020-09-12 MED ORDER — INSULIN ASPART 100 UNIT/ML ~~LOC~~ SOLN
2.0000 [IU] | SUBCUTANEOUS | Status: DC
  Administered 2020-09-12 – 2020-09-18 (×35): 2 [IU] via SUBCUTANEOUS

## 2020-09-12 NOTE — Progress Notes (Signed)
Inpatient Diabetes Program Recommendations  AACE/ADA: New Consensus Statement on Inpatient Glycemic Control (2015)  Target Ranges:  Prepandial:   less than 140 mg/dL      Peak postprandial:   less than 180 mg/dL (1-2 hours)      Critically ill patients:  140 - 180 mg/dL   Lab Results  Component Value Date   GLUCAP 225 (H) 09/12/2020   HGBA1C 7.8 (H) 09/10/2020    Review of Glycemic Control  Results for [name], Frank (MRN [id number]) as of 09/12/2020 07:57  Ref. Range 09/11/2020 08:29 09/11/2020 11:43 09/11/2020 15:41 09/11/2020 20:06 09/11/2020 23:30 09/12/2020 03:44  Glucose-Capillary Latest Ref Range: 70 - 99 mg/dL 214 (H) 204 (H) 105 (H) 120 (H) 162 (H) 225 (H)   Diabetes history: DM2 Outpatient Diabetes medications: Toujeo 10 units daily, Glipizide 10 mg daily Current orders for Inpatient glycemic control: Lantus 5 units qhs, Novolog 0-15 units Q4H  Osmolite 55 ml/hour started on 3/2  Inpatient Diabetes Program Recommendations:    - Add Novolog 2 units Q4 hours Tube Feed Coverage.  Will continue to follow while inpatient.  Thank you, Tama Headings RN, MSN, BC-ADM Inpatient Diabetes Coordinator Team Pager [phone] (8a-5p)

## 2020-09-12 NOTE — Progress Notes (Signed)
Pt had small sips of water and seemed to get strangled causing profuse coughing.  RN told pt to hold off on drinking more at this time.  Pt then asked to sit bed up taller to "catch his breath"  RN then noticed pt to have expiratory wheezes.  O2 sats WNL.  Placed pt on 2L [place] for now for comfort.  Paged Dr. Leonie Man who then consulted CCM.   Dr. Lake Bells at bedside.  Ordering CXR.  Keep NPO for now and adding a cough suppressant.    RN spoke with Dr. Leonie Man about BP continuing to be high and requiring a lot of labetalol and hydralazine.  New order to increase labetalol dosage.  Also adding Norvasc per tube.    Pts arms are increasingly reddening.  Spoke with both Dr. Leonie Man and Dr. Lake Bells about this.  No new orders at this time, just to continue elevating and warm compresses.  Will continue to monitor.

## 2020-09-12 NOTE — Progress Notes (Signed)
   09/12/20 1000  General Information  HPI 80 yr old was at outpatient rehabilitation facility and had a sudden onset of a new right sided facial droop. MRI showed subarachnoid hemorrhage/contrast around the right sylvian fissure. There is also some interval parenchymal hemorrhage in the infarct territory with small volume intraventricular extension. Per chart patient has a history of surgically managed subacute subdural hematomas May 2019. Other PMH: TBI (MOCA WNL), OSA, GERD, DM.  Temperature Spikes Noted No  Respiratory Status Supplemental O2 delivered via (comment)  Oral Cavity - Dentition Adequate natural dentition  Patient observed directly with PO's Yes  Type of PO's observed Thin liquids;Dysphagia 1 (puree);Regular  Feeding Needs assist  Liquids provided via Straw;Cup  Treatment Provided  Treatment provided Dysphagia  Dysphagia Treatment  Type of cueing Verbal  Amount of cueing Minimal  Family/Caregiver Educated wife  Treatment Methods Skilled observation;Upgraded PO texture trial;Differential diagnosis;Patient/caregiver education  Oral Phase Signs & Symptoms Oral holding;Prolonged bolus formation  Pharyngeal Phase Signs & Symptoms Immediate cough   Pt fully alert, dry oral mucosa immediately improved with ice and sips of water. Overall pt dramatically improved from prior session, however a mild respiratory dysphagia seems to persist. Pt noted to be short of breath and was orally holding, waiting for a moment to swallow. Pt agreed with this observation and said he felt short of breath. Pt tolerated small sips well until later in session after solids were trialed and pt started coughing. Further sips of water elicited further coughing as pt became more fatigued. Recommend sips of water or ice chips only, otherwise NPO. SLP will f/u tomorrow for need for instrumental assessment depending on pts progress.   SLP - End of Session  Patient left in bed;with family/visitor present  Assessment  / Recommendations / Plan  Plan Continue with current plan of care  Dysphagia Recommendations  Diet recommendations NPO;Other(comment) (sips of water/ice ok)  Liquids provided via Cup;Straw  Medication Administration Via alternative means  Supervision Staff to assist with self feeding  Compensations Slow rate;Small sips/bites  Postural Changes and/or Swallow Maneuvers Seated upright 90 degrees  General Recommendations  Oral Care Recommendations Oral care BID  Follow up Recommendations Skilled Nursing facility  SLP Visit Diagnosis Dysphagia, unspecified (R13.10)  Progression Toward Goals  Progression toward goals Progressing toward goals  SLP Time Calculation  SLP Start Time (ACUTE ONLY) 1050  SLP Stop Time (ACUTE ONLY) 1119  SLP Time Calculation (min) (ACUTE ONLY) 29 min  SLP Evaluations  $ SLP Speech Visit 1 Visit  SLP Evaluations  $Swallowing Treatment 1 Procedure

## 2020-09-12 NOTE — Progress Notes (Signed)
Referring Physician(s): Code stroke- [name] (neurology)  Supervising Physician: Luanne Bras  Patient Status:  [facility] - In-pt  Chief Complaint: Stroke F/U  Subjective:  History of acute CVA s/p cerebral arteriogram with emergent mechanical thrombectomy and stent placement of right MCA inferior division M2/M3 occlusion, along with angioplasty of right ICA stenosis, achieving a TICI 3 revascularization via right femoral approach 09/09/2020 by Dr. Estanislado Pandy. Patient laying in bed resting comfortably.  Can spontaneously move all extremities with weakness of left side. Right femoral puncture site c/d/i.  CT head 09/10/2020: 1. Evolving right MCA territory infarction with hemorrhagic conversion. Extent is better seen on same day MRI. Parenchymal, sulcal subarachnoid, and intraventricular hemorrhage are present. 2. Bilateral subdural hematomas again identified with small volume hyperdense hemorrhage. 3. Stable mild mass effect.  MR/MRA brain head 09/10/2020: 1. Large acute/subacute left-sided infarct with parenchymal hemorrhage measuring over 4 cm. PH2 hemorrhage. 2. Element of acute right subarachnoid hemorrhage. 3. Chronic bilateral subdural hematomas. 4. No significant midline shift.   Allergies: Lisinopril, Invokana [canagliflozin], Penicillins, and Sulfamethoxazole  Medications:  Current Facility-Administered Medications:  .  0.9 %  sodium chloride infusion, , Intravenous, Continuous, Olivencia-Simmons, Ivelisse, NP, Last Rate: 25 mL/hr at 09/12/20 0700, Infusion Verify at 09/12/20 0700 .  acetaminophen (TYLENOL) tablet 650 mg, 650 mg, Oral, Q4H PRN **OR** acetaminophen (TYLENOL) 160 MG/5ML solution 650 mg, 650 mg, Per Tube, Q4H PRN **OR** acetaminophen (TYLENOL) suppository 650 mg, 650 mg, Rectal, Q4H PRN, [name], MD .  aspirin chewable tablet 81 mg, 81 mg, Oral, Daily **OR** aspirin chewable tablet 81 mg, 81 mg, Per Tube, Daily, Deveshwar,  Sanjeev, MD, 81 mg at 09/11/20 1110 .  atorvastatin (LIPITOR) tablet 10 mg, 10 mg, Per Tube, Daily, Garvin Fila, MD, 10 mg at 09/11/20 1109 .  Chlorhexidine Gluconate Cloth 2 % PADS 6 each, 6 each, Topical, Daily, [name], MD, 6 each at 09/11/20 1631 .  cloNIDine (CATAPRES - Dosed in mg/24 hr) patch 0.2 mg, 0.2 mg, Transdermal, Weekly, Olivencia-Simmons, Ivelisse, NP, 0.2 mg at 09/11/20 1730 .  doxazosin (CARDURA) tablet 2 mg, 2 mg, Per Tube, QHS, Garvin Fila, MD, 2 mg at 09/11/20 2201 .  feeding supplement (OSMOLITE 1.5 CAL) liquid 1,000 mL, 1,000 mL, Per Tube, Continuous, Garvin Fila, MD, Last Rate: 55 mL/hr at 09/11/20 2202, 1,000 mL at 09/11/20 2202 .  feeding supplement (PROSource TF) liquid 45 mL, 45 mL, Per Tube, TID, Garvin Fila, MD, 45 mL at 09/11/20 2201 .  FLUoxetine (PROZAC) capsule 20 mg, 20 mg, Per Tube, Daily, Garvin Fila, MD, 20 mg at 09/11/20 1111 .  heparin injection 5,000 Units, 5,000 Units, Subcutaneous, Q8H, Olivencia-Simmons, Ivelisse, NP, 5,000 Units at 09/12/20 0510 .  hydrALAZINE (APRESOLINE) injection 20 mg, 20 mg, Intravenous, Q6H PRN, Olivencia-Simmons, Ivelisse, NP .  insulin aspart (novoLOG) injection 0-15 Units, 0-15 Units, Subcutaneous, Q4H, Garvin Fila, MD, 5 Units at 09/12/20 0859 .  insulin glargine (LANTUS) injection 5 Units, 5 Units, Subcutaneous, QHS, Olivencia-Simmons, Ivelisse, NP, 5 Units at 09/11/20 2200 .  iohexol (OMNIPAQUE) 300 MG/ML solution 100 mL, 100 mL, Intra-arterial, Once PRN, Deveshwar, Sanjeev, MD .  iohexol (OMNIPAQUE) 300 MG/ML solution 100 mL, 100 mL, Intra-arterial, Once PRN, Deveshwar, Sanjeev, MD .  iohexol (OMNIPAQUE) 300 MG/ML solution 50 mL, 50 mL, Intra-arterial, Once PRN, Deveshwar, Sanjeev, MD .  labetalol (NORMODYNE) injection 20 mg, 20 mg, Intravenous, Q2H PRN, Olivencia-Simmons, Ivelisse, NP, 20 mg at 09/12/20 0534 .  levETIRAcetam (  KEPPRA) IVPB 500 mg/100 mL premix, 500 mg, Intravenous, Q12H,  Olivencia-Simmons, Ivelisse, NP, Stopped at 09/11/20 2110 .  loratadine (CLARITIN) tablet 10 mg, 10 mg, Per Tube, QPM, Garvin Fila, MD, 10 mg at 09/11/20 1724 .  sodium chloride (hypertonic) 3 % solution, , Intravenous, Continuous, Olivencia-Simmons, Ivelisse, NP, Last Rate: 20 mL/hr at 09/12/20 0902, New Bag at 09/12/20 0902 .  sodium chloride flush (NS) 0.9 % injection 3 mL, 3 mL, Intravenous, Once, Pfeiffer, Marcy, MD .  ticagrelor (BRILINTA) tablet 45 mg, 45 mg, Per Tube, BID, Garvin Fila, MD, 45 mg at 09/11/20 2201    Vital Signs: BP (!) 161/69   Pulse 80   Temp 98.7 F (37.1 C) (Axillary)   Resp (!) 22   Ht 5' 9"  (1.753 m)   Wt 99 kg   SpO2 92%   BMI 32.23 kg/m   Physical Exam Vitals and nursing note reviewed.  Constitutional:      General: He is not in acute distress. Pulmonary:     Effort: Pulmonary effort is normal. No respiratory distress.  Skin:    General: Skin is warm and dry.     Comments: Right femoral puncture site soft without active bleeding or hematoma.  Neurological:     Comments: Follows simple commands. PERRL bilaterally. Can spontaneously move all extremities with weakness of left side. Distal pulses (DPs) 1+ bilaterally.      Imaging: CT HEAD WO CONTRAST  Result Date: 09/10/2020 CLINICAL DATA:  Stroke post revascularization and stent placement EXAM: CT HEAD WITHOUT CONTRAST TECHNIQUE: Contiguous axial images were obtained from the base of the skull through the vertex without intravenous contrast. COMPARISON:  Earlier same day FINDINGS: Brain: Parenchymal and subarachnoid hemorrhage again identified in the right cerebral hemisphere particularly about the sylvian fissure. Extent of hyperdensity has decreased probably reflecting component of contrast staining. This is superimposed on hypoattenuation reflecting evolving infarction with extent better seen on MRI. Small volume hemorrhage is present within the occipital horns. Bilateral subdural  hematomas are again identified with small volume hyperdense hemorrhage. Stable partial effacement of the right lateral ventricle and minor leftward midline shift. No hydrocephalus. Vascular: Right MCA branch stent.  No new findings. Skull: Prior left craniotomy and right burr hole. Sinuses/Orbits: No acute finding. Other: None. IMPRESSION: Evolving right MCA territory infarction with hemorrhagic conversion. Extent is better seen on same day MRI. Parenchymal, sulcal subarachnoid, and intraventricular hemorrhage are present. Bilateral subdural hematomas again identified with small volume hyperdense hemorrhage. Stable mild mass effect. Electronically Signed   By: Macy Mis M.D.   On: 09/10/2020 19:12   CT HEAD WO CONTRAST  Result Date: 09/10/2020 CLINICAL DATA:  Acute stroke suspected EXAM: CT HEAD WITHOUT CONTRAST TECHNIQUE: Contiguous axial images were obtained from the base of the skull through the vertex without intravenous contrast. COMPARISON:  Flat panel CT 09/09/2020 FINDINGS: Brain: Subarachnoid hemorrhage/contrast has increased/diffused along the right sylvian fissure and adjacent sulci. Visible cytotoxic edema in the right perisylvian cortex. There is a degree of parenchymal hemorrhage at the posterior caudate and deep white matter on the right, reaching the ependymoma with small volume intraventricular extension at the dependent lateral ventricles. No hydrocephalus. Bilateral chronic subdural hematoma with similar degree of wispy and nodular high-density posteriorly. Vascular: Right MCA branch stent. Skull: Prior burr holes on left-sided craniotomy. Sinuses/Orbits: No acute finding.  Bilateral cataract resection. Other: A call has been placed to the ordering provider. IMPRESSION: 1. Increased subarachnoid hemorrhage/contrast around the right sylvian fissure. There is  also some interval parenchymal hemorrhage in the infarct territory with small volume intraventricular extension. No hydrocephalus or  midline shift. 2. Right perisylvian cortical infarct correlating with affected vessel and the perfusion maps. 3. Chronic subdural hematomas. Electronically Signed   By: Monte Fantasia M.D.   On: 09/10/2020 06:01   MR ANGIO HEAD WO CONTRAST  Result Date: 09/10/2020 CLINICAL DATA:  Acute onset of difficulty standing and walking. Right facial droop. Status post revascularization of right inferior M2 M3 occlusion. Status post balloon angioplasty of right internal carotid artery. Placement intracranial stent. EXAM: MRI HEAD WITHOUT CONTRAST TECHNIQUE: Multiplanar, multiecho pulse sequences of the brain and surrounding structures were obtained without intravenous contrast. COMPARISON:  CT head without contrast, CT perfusion head and CTA head 09/09/2020. CT head without contrast 09/10/2020. FINDINGS: Brain: The diffusion-weighted images demonstrate an acute/subacute infarct similar in size to the predicted area of ischemia on the CT perfusion study. Parenchymal hemorrhage is noted throughout the bulk of the infarct. The parenchymal hemorrhage measures over 4 cm. Element of subarachnoid hemorrhage is present as well. Diffuse cortical edema is present within the infarct territory. There is some mass effect with out significant midline shift. No acute left-sided infarct is present. Mild atrophy is present. Chronic bilateral subdural hematomas are again noted. The internal auditory canals are within normal limits. The brainstem and cerebellum are within normal limits. Vascular: Flow is present in the major intracranial arteries. Artifact from intracranial stent within inferior left M2 segment noted. Skull and upper cervical spine: Craniocervical junction is within normal limits. Slight anterolisthesis is present at C2-3. Marrow signal is normal. Sinuses/Orbits: Mild mucosal thickening is scattered throughout the ethmoid air cells and frontal sinuses bilaterally. No significant mastoid effusion is present. No fluid levels  are present. Bilateral lens replacements are noted. Globes and orbits are otherwise unremarkable. IMPRESSION: 1. Large acute/subacute left-sided infarct with parenchymal hemorrhage measuring over 4 cm. PH2 hemorrhage. 2. Element of acute right subarachnoid hemorrhage. 3. Chronic bilateral subdural hematomas. 4. No significant midline shift. Electronically Signed   By: San Morelle M.D.   On: 09/10/2020 12:38   MR BRAIN WO CONTRAST  Result Date: 09/10/2020 CLINICAL DATA:  Acute onset of difficulty standing and walking. Right facial droop. Status post revascularization of right inferior M2 M3 occlusion. Status post balloon angioplasty of right internal carotid artery. Placement intracranial stent. EXAM: MRI HEAD WITHOUT CONTRAST TECHNIQUE: Multiplanar, multiecho pulse sequences of the brain and surrounding structures were obtained without intravenous contrast. COMPARISON:  CT head without contrast, CT perfusion head and CTA head 09/09/2020. CT head without contrast 09/10/2020. FINDINGS: Brain: The diffusion-weighted images demonstrate an acute/subacute infarct similar in size to the predicted area of ischemia on the CT perfusion study. Parenchymal hemorrhage is noted throughout the bulk of the infarct. The parenchymal hemorrhage measures over 4 cm. Element of subarachnoid hemorrhage is present as well. Diffuse cortical edema is present within the infarct territory. There is some mass effect with out significant midline shift. No acute left-sided infarct is present. Mild atrophy is present. Chronic bilateral subdural hematomas are again noted. The internal auditory canals are within normal limits. The brainstem and cerebellum are within normal limits. Vascular: Flow is present in the major intracranial arteries. Artifact from intracranial stent within inferior left M2 segment noted. Skull and upper cervical spine: Craniocervical junction is within normal limits. Slight anterolisthesis is present at C2-3.  Marrow signal is normal. Sinuses/Orbits: Mild mucosal thickening is scattered throughout the ethmoid air cells and frontal sinuses bilaterally. No significant  mastoid effusion is present. No fluid levels are present. Bilateral lens replacements are noted. Globes and orbits are otherwise unremarkable. IMPRESSION: 1. Large acute/subacute left-sided infarct with parenchymal hemorrhage measuring over 4 cm. PH2 hemorrhage. 2. Element of acute right subarachnoid hemorrhage. 3. Chronic bilateral subdural hematomas. 4. No significant midline shift. Electronically Signed   By: San Morelle M.D.   On: 09/10/2020 12:38   CT CEREBRAL PERFUSION W CONTRAST  Result Date: 09/09/2020 CLINICAL DATA:  80 year old male code stroke. Neglect. Right parietal lobe implicated. History of unresolved subdural hematomas. EXAM: CT ANGIOGRAPHY HEAD AND NECK CT PERFUSION BRAIN TECHNIQUE: Multidetector CT imaging of the head and neck was performed using the standard protocol during bolus administration of intravenous contrast. Multiplanar CT image reconstructions and MIPs were obtained to evaluate the vascular anatomy. Carotid stenosis measurements (when applicable) are obtained utilizing NASCET criteria, using the distal internal carotid diameter as the denominator. Multiphase CT imaging of the brain was performed following IV bolus contrast injection. Subsequent parametric perfusion maps were calculated using RAPID software. CONTRAST:  154m OMNIPAQUE IOHEXOL 350 MG/ML SOLN COMPARISON:  Plain head CT 1105 hours today reported separately. Intracranial MRA 06/24/2017. FINDINGS: CT Brain Perfusion Findings: ASPECTS: 10 CBF (<30%) Volume: 150mPerfusion (Tmax>6.0s) volume: 4160m Hypoperfusion index 0.1 Mismatch Volume: 62m61mfarction Location:Posterior right MCA territory. CTA NECK Skeleton: Previous left superior frontal craniotomy. Chronic bilateral calvarium burr holes. No acute osseous abnormality identified. Cervical spine  degeneration. Upper chest: Partially visible left chest cardiac pacemaker. Otherwise negative. Other neck: Mild motion artifact. No acute findings identified in the neck. Aortic arch: 3 vessel arch configuration. Relatively mild Calcified aortic atherosclerosis. Right carotid system: Mild calcified plaque of the brachiocephalic artery without stenosis. Normal right CCA origin. Intermittent moderate calcified plaque in the right CCA proximal to the bifurcation, and at the bifurcation a radiographic string sign occurs at the ICA origin related to very bulky calcified plaque on series 5, image 108. Additional bulky plaque through the distal bulb, tandem stenosis on series 5, image 103. But the vessel remains patent. No additional stenosis to the skull base. Left carotid system: Mild left CCA origin but similar moderate left CCA and left carotid bifurcation through ICA bulb calcified plaque. On this side subsequent stenosis is less than 50 % with respect to the distal vessel. Vertebral arteries: Mild to moderate proximal right subclavian calcified plaque. Calcified plaque at the right vertebral artery origin with mild to moderate stenosis. Patent right vertebral then to the skull base. Mild proximal left subclavian and left vertebral artery origin calcified plaque without stenosis. Tortuous left V1 segment. Codominant left vertebral is otherwise normal to the skull base. CTA HEAD Posterior circulation: Negative distal vertebral arteries and basilar. The left V4 is mildly dominant. Normal PICA origins. Patent SCA and PCA origins. Small posterior communicating arteries. Mild left PCA P1 irregularity. Bilateral PCA branches are within normal limits. Anterior circulation: Both ICA siphons are patent. On the left cavernous segment calcified plaque results in no stenosis. Normal left ophthalmic and posterior communicating artery origins. On the right cavernous segment calcified plaque results in no significant stenosis.  Patent carotid termini, MCA and ACA origins. Dominant left A1. 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 Otherwise the bilateral ACA branches are within normal limits. Left MCA M1 segment and bifurcation are patent. Left MCA branches appears table since 2018. Right MCA M1 and bifurcation remain patent. Dominant posterior right MCA M2 branch is  occluded at the level of punctate calcification on series 12, image 14. This is at a bifurcation where the superior branch remains patent. But the parent vessel is occluded. This calcification is new compared to last year. Venous sinuses: Patent. Anatomic variants: Dominant left ACA A1. Review of the MIP images confirms the above findings IMPRESSION: 1. Positive for emergent occlusion of a posterior Right MCA distal M2 branch at its bifurcation. Embolized calcification there is new since last year (series 12, image 14). 2. CT Perfusion detects a small infarct core of 13 mL with regional penumbra of 41 mL (28 mL mismatch). 3. Superimposed very bulky calcified plaque at the Right ICA origin with tandem HIGH-GRADE STENOSIS, including Radiographic String Sign (series 7, image 216). 4. The above were reviewed in person with Dr. Roland Rack on 09/09/2020 at 1128 hours. 5. 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 6. Bulky Left Carotid bifurcation and proximal left ICA calcified plaque without significant stenosis. 7. Up to moderate Right Vertebral Artery origin stenosis. Electronically Signed   By: Genevie Ann M.D.   On: 09/09/2020 11:46   DG Abd Portable 1V  Result Date: 09/09/2020 CLINICAL DATA:  Nasogastric tube placement EXAM: PORTABLE ABDOMEN - 1 VIEW COMPARISON:  None. FINDINGS: No nasogastric tube is identified within the visualized abdomen. The right flank and pelvis are excluded from view. The visualized abdominal gas pattern is unremarkable. IMPRESSION:  Nasogastric tube not identified within the visualized distal esophagus and abdomen. Chest radiograph may be helpful to identify the location of a malpositioned catheter. These results will be called to the ordering clinician or representative by the Radiologist Assistant, and communication documented in the PACS or [employer]. Electronically Signed   By: Fidela Salisbury MD   On: 09/09/2020 23:49   EEG adult  Result Date: 09/11/2020 Lora Havens, MD     09/11/2020  1:10 PM Patient Name: Frank [name] MRN: [id number] Epilepsy Attending: Lora Havens Referring Physician/Provider: Tollie Eth, NP Date: 09/11/2020 Duration: 24.37 mins Patient history: 80 year old male with history of seizures, presented with altered mental status. EEG to evaluate for seizures. Level of alertness: Awake,asleep AEDs during EEG study: Keppra Technical aspects: This EEG study was done with scalp electrodes positioned according to the 10-20 International system of electrode placement. Electrical activity was acquired at a sampling rate of 500Hz  and reviewed with a high frequency filter of 70Hz  and a low frequency filter of 1Hz . EEG data were recorded continuously and digitally stored. Description: No clear posterior dominant rhythm was seen.  Sleep was characterized by vertex waves, sleep spindles (12 to 14 Hz), maximal frontocentral region.  EEG showed continuous 5 to 8 Hz theta-alpha activity in left hemisphere admixed with 15 to 18 Hz beta activity in left central temporal region consistent with breach artifact.  There is also 3 to 5 Hz theta-delta slowing in right hemisphere. Sharp transients were seen in right> left temporal region.  Hyperventilation and photic stimulation were not performed.   ABNORMALITY -Continuous slow, generalized and lateralized right hemisphere -Breach artifact, left centro- temporal region IMPRESSION: This study is suggestive of cortical dysfunction in right hemisphere likely  secondary to underlying infarct.  There is also evidence of breach artifact in left centro- temporal region consistent with prior craniotomy.  Additionally there is evidence of moderate diffuse encephalopathy, nonspecific etiology.  No seizures or definite epileptiform discharges were seen throughout the recording.  If suspicion for interictal activity remains a concern, a prolonged study can be  considered. Lora Havens   ECHOCARDIOGRAM COMPLETE  Result Date: 09/09/2020    ECHOCARDIOGRAM REPORT   Patient Name:   DAIMIAN SUDBERRY [facility] Date of Exam: 09/09/2020 Medical Rec #:  [id number]             Height:       69.0 in Accession #:    [id number]            Weight:       218.3 lb Date of Birth:  1940/11/22             BSA:          2.144 m Patient Age:    80 years              BP:           100/45 mmHg Patient Gender: M                     HR:           70 bpm. Exam Location:  Inpatient Procedure: 2D Echo, Cardiac Doppler, Color Doppler and Intracardiac            Opacification Agent Indications:    Stroke  History:        Patient has prior history of Echocardiogram examinations, most                 recent 06/24/2017. Risk Factors:Sleep Apnea, Diabetes and Former                 Smoker. GERD.  Sonographer:    Clayton Lefort RDCS (AE) Referring Phys: [phone] [facility]  Sonographer Comments: Technically difficult study due to poor echo windows. Image acquisition challenging due to respiratory motion. IMPRESSIONS  1. Left ventricular ejection fraction, by estimation, is 65 to 70%. The left ventricle has normal function. The left ventricle has no regional wall motion abnormalities. There is severe concentric left ventricular hypertrophy. Left ventricular diastolic  parameters are consistent with Grade I diastolic dysfunction (impaired relaxation). Elevated left atrial pressure.  2. Right ventricular systolic function is normal. The right ventricular size is mildly enlarged. There is normal pulmonary artery  systolic pressure.  3. The mitral valve is normal in structure. No evidence of mitral valve regurgitation. Mild mitral stenosis. The mean mitral valve gradient is 3.0 mmHg. Moderate mitral annular calcification.  4. The aortic valve is normal in structure. There is severe calcifcation of the aortic valve. There is severe thickening of the aortic valve. Aortic valve regurgitation is not visualized. Mild aortic valve stenosis. Aortic valve mean gradient measures 9.0 mmHg.  5. The inferior vena cava is normal in size with greater than 50% respiratory variability, suggesting right atrial pressure of 3 mmHg. FINDINGS  Left Ventricle: Left ventricular ejection fraction, by estimation, is 65 to 70%. The left ventricle has normal function. The left ventricle has no regional wall motion abnormalities. Definity contrast agent was given IV to delineate the left ventricular  endocardial borders. The left ventricular internal cavity size was normal in size. There is severe concentric left ventricular hypertrophy. Left ventricular diastolic parameters are consistent with Grade I diastolic dysfunction (impaired relaxation). Elevated left atrial pressure. Right Ventricle: The right ventricular size is mildly enlarged. No increase in right ventricular wall thickness. Right ventricular systolic function is normal. There is normal pulmonary artery systolic pressure. Left Atrium: Left atrial size was normal in size. Right Atrium: Right atrial size was normal in size. Pericardium: There  is no evidence of pericardial effusion. Mitral Valve: The mitral valve is normal in structure. There is moderate thickening of the mitral valve leaflet(s). There is moderate calcification of the mitral valve leaflet(s). Moderate mitral annular calcification. No evidence of mitral valve regurgitation. Mild mitral valve stenosis. MV peak gradient, 10.9 mmHg. The mean mitral valve gradient is 3.0 mmHg. Tricuspid Valve: The tricuspid valve is normal in  structure. Tricuspid valve regurgitation is mild . No evidence of tricuspid stenosis. Aortic Valve: The aortic valve is normal in structure. There is severe calcifcation of the aortic valve. There is severe thickening of the aortic valve. Aortic valve regurgitation is not visualized. Mild aortic stenosis is present. Aortic valve mean gradient measures 9.0 mmHg. Aortic valve peak gradient measures 15.7 mmHg. Aortic valve area, by VTI measures 1.81 cm. Pulmonic Valve: The pulmonic valve was normal in structure. Pulmonic valve regurgitation is not visualized. No evidence of pulmonic stenosis. Aorta: The aortic root is normal in size and structure. Venous: The inferior vena cava is normal in size with greater than 50% respiratory variability, suggesting right atrial pressure of 3 mmHg. IAS/Shunts: No atrial level shunt detected by color flow Doppler.  LEFT VENTRICLE PLAX 2D LVIDd:         4.70 cm  Diastology LVIDs:         3.30 cm  LV e' medial:    6.42 cm/s LV PW:         1.80 cm  LV E/e' medial:  15.5 LV IVS:        1.60 cm  LV e' lateral:   6.64 cm/s LVOT diam:     2.00 cm  LV E/e' lateral: 15.0 LV SV:         71 LV SV Index:   33 LVOT Area:     3.14 cm  RIGHT VENTRICLE             IVC RV Basal diam:  3.50 cm     IVC diam: 1.60 cm RV Mid diam:    3.90 cm RV S prime:     11.70 cm/s TAPSE (M-mode): 2.4 cm LEFT ATRIUM             Index       RIGHT ATRIUM           Index LA diam:        3.60 cm 1.68 cm/m  RA Area:     17.70 cm LA Vol (A2C):   59.5 ml 27.75 ml/m RA Volume:   43.70 ml  20.38 ml/m LA Vol (A4C):   53.2 ml 24.81 ml/m LA Biplane Vol: 61.9 ml 28.87 ml/m  AORTIC VALVE AV Area (Vmax):    1.69 cm AV Area (Vmean):   1.83 cm AV Area (VTI):     1.81 cm AV Vmax:           198.33 cm/s AV Vmean:          137.000 cm/s AV VTI:            0.390 m AV Peak Grad:      15.7 mmHg AV Mean Grad:      9.0 mmHg LVOT Vmax:         107.00 cm/s LVOT Vmean:        79.800 cm/s LVOT VTI:          0.225 m LVOT/AV VTI ratio:  0.58  AORTA Ao Root diam: 3.20 cm Ao Asc diam:  3.20 cm MITRAL VALVE  MV Area (PHT): 1.89 cm     SHUNTS MV Area VTI:   1.89 cm     Systemic VTI:  0.22 m MV Peak grad:  10.9 mmHg    Systemic Diam: 2.00 cm MV Mean grad:  3.0 mmHg MV Vmax:       1.65 m/s MV Vmean:      78.7 cm/s MV Decel Time: 401 msec MV E velocity: 99.50 cm/s MV A velocity: 175.00 cm/s MV E/A ratio:  0.57 Ena Dawley MD Electronically signed by Ena Dawley MD Signature Date/Time: 09/09/2020/10:39:50 PM    Final    IR PERCUTANEOUS ART THROMBECTOMY/INFUSION INTRACRANIAL INC DIAG ANGIO  Result Date: 09/11/2020 INDICATION: New onset left-sided numbness, left-sided neglect and confusion. Occluded right middle cerebral artery inferior division distal M2 M3 segment on CT angiogram of the head and neck. EXAM: 1. EMERGENT LARGE VESSEL OCCLUSION THROMBOLYSIS (anterior CIRCULATION) COMPARISON:  CT angiogram of the head and neck of September 09, 2020. MEDICATIONS: Vancomycin 1 g IV antibiotic was administered within 1 hour of the procedure. ANESTHESIA/SEDATION: General anesthesia CONTRAST:  Isovue 300 approximately 90 mL FLUOROSCOPY TIME:  Fluoroscopy Time: 55 minutes 12 seconds (3749 mGy). COMPLICATIONS: None immediate. TECHNIQUE: Following a full explanation of the procedure along with the potential associated complications, an informed witnessed consent was obtained from the patient's spouse. The risks of intracranial hemorrhage of 10%, worsening neurological deficit, ventilator dependency, death and inability to revascularize were all reviewed in detail with the patient's spouse. The patient was then put under general anesthesia by the Department of Anesthesiology at [facility]. The right groin was prepped and draped in the usual sterile fashion. Thereafter using modified Seldinger technique, transfemoral access into the right common femoral artery was obtained without difficulty. Over a 0.035 inch guidewire an 8 French 25 cm Pinnacle  sheath was inserted. Through this, and also over a 0.035 inch guidewire a 5 [place] JB 1 catheter was advanced to the aortic arch region and selectively positioned in the right common carotid artery and the innominate artery. FINDINGS: The innominate arteriogram demonstrates patency of the right subclavian artery. The right vertebral artery demonstrates moderate tortuosity with wide patency. The vessel is seen to opacify to the cranial skull base. The right common carotid arteriogram demonstrates a complex heterogeneous partially calcified arteriosclerotic plaque extending from the distal common carotid artery into the right external carotid artery and the right internal carotid artery just distal to the bulb. Moderate to severe narrowing is seen of the right external carotid artery. Its branches are widely opacified. The right internal carotid artery at the bulb demonstrates approximately 70% stenosis associated with a prominent ulceration along the posterior wall, and also a reach of irregular atherosclerotic plaque along the lateral aspect associated with a 50% stenosis. More distally the right internal carotid artery is seen to opacify normally to the cranial skull base. The petrous, the cavernous and the supraclinoid segments are widely patent. The right middle cerebral artery demonstrates patency with occlusion of the inferior division of the right middle cerebral artery in the M2 M3 region with prominent area of hypoperfusion involving the anterior parietal posterior frontal regions. Venous phase is within normal limits. Hypoplastic right anterior cerebral artery is seen opacifying into the distal right anterior cerebral A2 segment. PROCEDURE: The diagnostic JB 1 catheter in the right common carotid artery was then exchanged over a 0.035 inch 300 cm Rosen exchange guidewire for an 087 balloon guide catheter which had been prepped with 50% contrast and 50% heparinized  saline infusion. The guidewire was removed.  Good aspiration obtained from the hub of the balloon guide catheter which was positioned just proximal to the origin of the right internal carotid artery. Control arteriogram was then performed which demonstrated no change in the extracranial or intracranial circulation. Over a 0.014 inch standard Synchro micro guidewire with a J configuration, an 021 microcatheter was then advanced inside of an 071 36 cm Zoom aspiration catheter to the distal aspect of the balloon guide catheter. The micro guidewire and the microcatheter were advanced without event into the petrous horizontal segment. However, advancement of the Zoom guide catheter was met with resistance secondary to the irregular nature of the complex arteriosclerotic plaque in the carotid bulb region. This system was then removed. Over a 0.014 inch standard Synchro micro guidewire an 021 Trevo ProVue microcatheter was then advanced into the horizontal petrous segment. The guidewire was removed. Good aspiration obtained from the hub of the catheter. This in turn was exchanged for an 014 inch 300 cm Synchro exchange micro guidewire. The distal end of the micro guidewire was positioned in the horizontal petrous segment with a J configuration. Over the exchange micro guidewire, a 4/30 cm Viatrac 14 angioplasty balloon catheter which had been prepped with 50% contrast and 50% heparinized saline infusion and with heparinized saline infusion was advanced using the rapid exchange technique and positioned with the markers distally and proximally aligned appropriately. A control angioplasty was then performed using micro inflation syringe device via micro tubing. Balloon was expanded to just over 4 mm where it was maintained for a full 1 minute. Balloon was then deflated and retrieved and removed proximally. A control arteriogram performed through the balloon guide demonstrated significantly improved caliber and flow through the angioplastied segment. Over the exchange  micro guidewire, a combination of an 021 microcatheter inside of an 055 136 cm Zoom aspiration catheter was then advanced without difficulty to the petrous horizontal segment. The exchange micro guidewire was removed. Good aspiration obtained from the microcatheter. Over a 0.014 inch standard Synchro micro guidewire with a J configuration, the combination of the microcatheter and micro guidewire was then advanced to the distal M1 segment. Using a torque device, the inferior division was then entered followed by the microcatheter and advanced distally to just proximal to the occluded prominent branch. The Zoom aspiration catheter was then advanced into the inferior division. Thereafter, attempts were made to advance the micro guidewire through the occluded inferior division in the distal M2 segment which was met with significant resistance. A different micro guidewire was then advanced and attempts were made to advance through the occluded segment. The micro guidewire eventually was able to be advanced distal to the occlusion, however, the microcatheter met with significant resistance. Micro guidewire and the microcatheter were retrieved and removed. An 035 aspiration catheter was then advanced over an 014 inch micro guidewire distal to the 32 Zoom aspiration catheter and advanced such that the micro guidewire and the 035 Zoom was in contact with the occluded segment of the inferior division. The 55 aspiration catheter was then advanced to be contact with the occluded firm clot. Thereafter, the micro guidewire was removed as was the 35 aspiration catheter due to lack of penetration through the occluded segment. Constant aspiration was then applied over about 3-4 minutes through the 55 aspiration catheter. The 55 Zoom aspiration catheter was then retrieved more proximally until there was free aspiration. Control arteriogram performed through the Zoom aspiration catheter demonstrated no change in the occluded  dominant  inferior division in the distal M2 segment. At this time, in a coaxial manner and with constant heparinized saline infusion, an 017 Headway microcatheter was then advanced over a 0.014 inch standard Synchro micro guidewire distal to the 55 Zoom aspiration catheter which was in the distal right M1 segment. The micro guidewire was gently advanced with the microcatheter this time without any difficulty through the occlusion into the distal M2 M3 junction followed by the microcatheter. The micro guidewire was removed. Good aspiration obtained from the hub of the microcatheter which was then connected to continuous heparinized saline infusion. A Tiger 17 retrieval was then advanced and deployed in the usual manner from the proximal M3 to the proximal M2 segment. The aspiration catheter was then advanced to just at the level of the occluded inferior division. Aspiration then applied through the aspiration catheter for approximately 2 minutes. The Tiger device was expanded and deflated approximately 7 times. Thereafter, the combination of the retrieval device, and the microcatheter, and the Zoom microcatheter was then retrieved and removed. A control arteriogram performed through the balloon guide which had now been advanced into the distal right into the internal carotid artery demonstrated near complete revascularization of the previously occluded inferior division M2 M3 segment with a TICI 3 revascularization. There continued to be a filling defect at the junction of the previously occluded vessel, and side-branch. This was for approximately 20 minutes with progressive slightly decreased hemodynamic flow distal to the treated vessel. It was, therefore, elected to proceed with rescue stenting. Prior to this, the patient was loaded with aspirin 81 mg and and Brilinta 180 mg via an orogastric tube. CT of the brain demonstrated mild hyperattenuation in the subarachnoid space in the sylvian fissure extending into the  convexity of the posterior frontal and anterior parietal regions. The 55 Zoom aspiration catheter in combination with in 021 microcatheter over a 0.014 inch standard Synchro micro guidewire was then advanced in combination into the distal M1 segment. Micro guidewire was then advanced without difficulty through the previously revascularized branch into the distal M3 region, followed by the microcatheter. The guidewire was removed. Good aspiration obtained from the microcatheter. Which the connected to continuous heparin saline infusion. It was elected to proceed with placement of a 4 mm x 24 mm Neuroform atlas stent. This was then advanced in a coaxial manner with constant heparinized saline infusion to the distal marker on the microcatheter. The O ring on the delivery microcatheter was loosened. With slight forward gentle traction with the right hand on the delivery micro guidewire with the right hand, the microcatheter was then unsheathed deploying the stent. The delivery micro guidewire and the microcatheter was gently retrieved more proximally. A control arteriogram performed through the 55 aspiration catheter and in the distal right internal carotid artery now demonstrates significantly improved caliber and flow through the entirety of the right middle cerebral artery inferior division. A TICI 3 revascularization was maintained. The right internal carotid artery continued to demonstrate excellent flow through the right middle and the right anterior cerebellar arteries. The balloon guide catheter was gently retrieved with the balloon guide catheter gently retrieved with the 55 aspiration catheter proximal to the right common carotid bifurcation. A control arteriogram performed through the balloon guide catheter in the right common carotid artery continued to demonstrate significantly improved caliber and flow through the previous angioplastjed segment of the right internal carotid artery at the bulb. There  continued be approximately 50% stenosis. However, given the significant improved flow  it was elected to proceed with stenting at this time. Intracranially there continued be no evidence of intraluminal filling defects or of occlusions. No mass-effect was noted. The balloon guide was removed. The 8 French Pinnacle sheath was removed with successful hemostasis of the right groin puncture site with an 8 French Angio-Seal closure device. The right groin appeared soft. Distal pulses remained palpable unchanged prior to procedure. A repeat CT of the brain demonstrated marginally increased hyperattenuation in the posterior perisylvian, and the frontal parietal regions. There is no mass effect or midline shift. 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 The patient's general anesthesia was reversed and the patient was extubated without difficulty. Upon recovery, the patient was able to move all his fours adequately. He denied any headaches, nausea or vomiting. He continued to have facial asymmetry. However, his speech was relatively clear. He was then transferred to the neuro ICU for post revascularization management. IMPRESSION: Status post endovascular complete revascularization of a calcified arteriosclerotic plaque in the right inferior division of the right middle cerebral artery M2 M3 segment with 1 pass with contact aspiration, 1 pass with the Tiger 17 retrieval device and aspiration achieving a TICI 3 revascularization Placement of a rescue stent at the site of the occluded inferior division distal M2 M3 region with continued TICI 3 revascularization. PLAN: Follow-up in the clinic 2-4 weeks post discharge. Electronically Signed   By: Luanne Bras M.D.   On: 09/10/2020 11:03   CT HEAD CODE STROKE WO CONTRAST  Result Date: 09/09/2020 CLINICAL DATA:  Code stroke. Neuro deficit, acute, stroke suspected. Additional history provided: Right-sided facial droop, last known well 9:15  a.m. EXAM: CT HEAD WITHOUT CONTRAST TECHNIQUE: Contiguous axial images were obtained from the base of the skull through the vertex without intravenous contrast. COMPARISON:  Head CT 02/08/2020. FINDINGS: Brain: Mild cerebral atrophy. Redemonstrated subdural hematomas overlying the bilateral cerebral convexities. These are unchanged in size as compared to the prior head CT of 02/08/2020 again measuring up to 9 mm in thickness bilaterally. Although intermediate to low-density, the density of the right subdural hematoma has subtly increased as compared to the prior examination which may reflect some degree of interval hemorrhage into the collection. 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 Scattered small foci of calcification within both collections. Symmetric mass effect upon the underlying cerebral hemispheres without midline shift. Ill-defined hypoattenuation within the cerebral white matter is nonspecific, but compatible with chronic small vessel ischemic disease. No acute demarcated cortical infarct. No evidence of intracranial mass. Vascular: No hyperdense vessel.  Atherosclerotic calcifications. Skull: No calvarial fracture.  Left-sided cranioplasty. Sinuses/Orbits: Partial opacification of left ethmoid air cells. Background mild bilateral ethmoid and sphenoid sinus mucosal thickening. ASPECTS ([place] Stroke Program Early CT Score) - Ganglionic level infarction (caudate, lentiform nuclei, internal capsule, insula, M1-M3 cortex): 7 - Supraganglionic infarction (M4-M6 cortex): 3 Total score (0-10 with 10 being normal): 10 Dr. Kathrynn Speed aware of these findings at 11:20 a.m. on 09/09/2020 (discussed with Dr. Lars Pinks). IMPRESSION: Redemonstrated subdural hematomas overlying the bilateral cerebral convexities. As before, these hematomas measure up to 9 mm in greatest thickness. Although intermediate to low-density, the density of the right subdural hematoma has subtly  increased from the examination of 02/08/2020, suggestive of some degree of interval hemorrhage into the collection. The left subdural hematoma remains low-density. Unchanged symmetric mass effect upon the cerebral hemispheres without midline shift. No acute demarcated cortical infarct is identified.  ASPECTS is 10. Stable generalized atrophy of  the brain and chronic small vessel ischemic disease. Electronically Signed   By: Kellie Simmering DO   On: 09/09/2020 11:35   CT ANGIO HEAD CODE STROKE  Result Date: 09/09/2020 CLINICAL DATA:  80 year old male code stroke. Neglect. Right parietal lobe implicated. History of unresolved subdural hematomas. EXAM: CT ANGIOGRAPHY HEAD AND NECK CT PERFUSION BRAIN TECHNIQUE: Multidetector CT imaging of the head and neck was performed using the standard protocol during bolus administration of intravenous contrast. Multiplanar CT image reconstructions and MIPs were obtained to evaluate the vascular anatomy. Carotid stenosis measurements (when applicable) are obtained utilizing NASCET criteria, using the distal internal carotid diameter as the denominator. Multiphase CT imaging of the brain was performed following IV bolus contrast injection. Subsequent parametric perfusion maps were calculated using RAPID software. CONTRAST:  168m OMNIPAQUE IOHEXOL 350 MG/ML SOLN COMPARISON:  Plain head CT 1105 hours today reported separately. Intracranial MRA 06/24/2017. FINDINGS: CT Brain Perfusion Findings: ASPECTS: 10 CBF (<30%) Volume: 133mPerfusion (Tmax>6.0s) volume: 4110m Hypoperfusion index 0.1 Mismatch Volume: 36m15mfarction Location:Posterior right MCA territory. CTA NECK Skeleton: Previous left superior frontal craniotomy. Chronic bilateral calvarium burr holes. No acute osseous abnormality identified. Cervical spine degeneration. Upper chest: Partially visible left chest cardiac pacemaker. Otherwise negative. Other neck: Mild motion artifact. No acute findings identified in the neck.  Aortic arch: 3 vessel arch configuration. Relatively mild Calcified aortic atherosclerosis. Right carotid system: Mild calcified plaque of the brachiocephalic artery without stenosis. Normal right CCA origin. Intermittent moderate calcified plaque in the right CCA proximal to the bifurcation, and at the bifurcation a radiographic string sign occurs at the ICA origin related to very bulky calcified plaque on series 5, image 108. Additional bulky plaque through the distal bulb, tandem stenosis on series 5, image 103. But the vessel remains patent. No additional stenosis to the skull base. Left carotid system: Mild left CCA origin but similar moderate left CCA and left carotid bifurcation through ICA bulb calcified plaque. On this side subsequent stenosis is less than 50 % with respect to the distal vessel. Vertebral arteries: Mild to moderate proximal right subclavian calcified plaque. Calcified plaque at the right vertebral artery origin with mild to moderate stenosis. Patent right vertebral then to the skull base. Mild proximal left subclavian and left vertebral artery origin calcified plaque without stenosis. Tortuous left V1 segment. Codominant left vertebral is otherwise normal to the skull base. CTA HEAD Posterior circulation: Negative distal vertebral arteries and basilar. The left V4 is mildly dominant. Normal PICA origins. Patent SCA and PCA origins. Small posterior communicating arteries. Mild left PCA P1 irregularity. Bilateral PCA branches are within normal limits. Anterior circulation: Both ICA siphons are patent. On the left cavernous segment calcified plaque results in no stenosis. Normal left ophthalmic and posterior communicating artery origins. On the right cavernous segment calcified plaque results in no significant stenosis. Patent carotid termini, MCA and ACA origins. Dominant left A1. 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 Otherwise the bilateral ACA branches are within normal limits. Left MCA M1 segment and bifurcation are patent. Left MCA branches appears table since 2018. Right MCA M1 and bifurcation remain patent. Dominant posterior right MCA M2 branch is occluded at the level of punctate calcification on series 12, image 14. This is at a bifurcation where the superior branch remains patent. But the parent vessel is occluded. This calcification is new compared to last year. Venous sinuses: Patent. Anatomic variants: Dominant left  ACA A1. Review of the MIP images confirms the above findings IMPRESSION: 1. Positive for emergent occlusion of a posterior Right MCA distal M2 branch at its bifurcation. Embolized calcification there is new since last year (series 12, image 14). 2. CT Perfusion detects a small infarct core of 13 mL with regional penumbra of 41 mL (28 mL mismatch). 3. Superimposed very bulky calcified plaque at the Right ICA origin with tandem HIGH-GRADE STENOSIS, including Radiographic String Sign (series 7, image 216). 4. The above were reviewed in person with Dr. Roland Rack on 09/09/2020 at 1128 hours. 5. 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 6. Bulky Left Carotid bifurcation and proximal left ICA calcified plaque without significant stenosis. 7. Up to moderate Right Vertebral Artery origin stenosis. Electronically Signed   By: Genevie Ann M.D.   On: 09/09/2020 11:46   CT ANGIO NECK CODE STROKE  Result Date: 09/09/2020 CLINICAL DATA:  80 year old male code stroke. Neglect. Right parietal lobe implicated. History of unresolved subdural hematomas. EXAM: CT ANGIOGRAPHY HEAD AND NECK CT PERFUSION BRAIN TECHNIQUE: Multidetector CT imaging of the head and neck was performed using the standard protocol during bolus administration of intravenous contrast. Multiplanar CT image reconstructions and MIPs were obtained to evaluate the vascular anatomy. Carotid stenosis  measurements (when applicable) are obtained utilizing NASCET criteria, using the distal internal carotid diameter as the denominator. Multiphase CT imaging of the brain was performed following IV bolus contrast injection. Subsequent parametric perfusion maps were calculated using RAPID software. CONTRAST:  160m OMNIPAQUE IOHEXOL 350 MG/ML SOLN COMPARISON:  Plain head CT 1105 hours today reported separately. Intracranial MRA 06/24/2017. FINDINGS: CT Brain Perfusion Findings: ASPECTS: 10 CBF (<30%) Volume: 163mPerfusion (Tmax>6.0s) volume: 4174m Hypoperfusion index 0.1 Mismatch Volume: 85m69mfarction Location:Posterior right MCA territory. CTA NECK Skeleton: Previous left superior frontal craniotomy. Chronic bilateral calvarium burr holes. No acute osseous abnormality identified. Cervical spine degeneration. Upper chest: Partially visible left chest cardiac pacemaker. Otherwise negative. Other neck: Mild motion artifact. No acute findings identified in the neck. Aortic arch: 3 vessel arch configuration. Relatively mild Calcified aortic atherosclerosis. Right carotid system: Mild calcified plaque of the brachiocephalic artery without stenosis. Normal right CCA origin. Intermittent moderate calcified plaque in the right CCA proximal to the bifurcation, and at the bifurcation a radiographic string sign occurs at the ICA origin related to very bulky calcified plaque on series 5, image 108. Additional bulky plaque through the distal bulb, tandem stenosis on series 5, image 103. But the vessel remains patent. No additional stenosis to the skull base. Left carotid system: Mild left CCA origin but similar moderate left CCA and left carotid bifurcation through ICA bulb calcified plaque. On this side subsequent stenosis is less than 50 % with respect to the distal vessel. Vertebral arteries: Mild to moderate proximal right subclavian calcified plaque. Calcified plaque at the right vertebral artery origin with mild to  moderate stenosis. Patent right vertebral then to the skull base. Mild proximal left subclavian and left vertebral artery origin calcified plaque without stenosis. Tortuous left V1 segment. Codominant left vertebral is otherwise normal to the skull base. CTA HEAD Posterior circulation: Negative distal vertebral arteries and basilar. The left V4 is mildly dominant. Normal PICA origins. Patent SCA and PCA origins. Small posterior communicating arteries. Mild left PCA P1 irregularity. Bilateral PCA branches are within normal limits. Anterior circulation: Both ICA siphons are patent. On the left cavernous segment calcified plaque results in no stenosis. Normal left ophthalmic and posterior communicating  artery origins. On the right cavernous segment calcified plaque results in no significant stenosis. Patent carotid termini, MCA and ACA origins. Dominant left A1. 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 Otherwise the bilateral ACA branches are within normal limits. Left MCA M1 segment and bifurcation are patent. Left MCA branches appears table since 2018. Right MCA M1 and bifurcation remain patent. Dominant posterior right MCA M2 branch is occluded at the level of punctate calcification on series 12, image 14. This is at a bifurcation where the superior branch remains patent. But the parent vessel is occluded. This calcification is new compared to last year. Venous sinuses: Patent. Anatomic variants: Dominant left ACA A1. Review of the MIP images confirms the above findings IMPRESSION: 1. Positive for emergent occlusion of a posterior Right MCA distal M2 branch at its bifurcation. Embolized calcification there is new since last year (series 12, image 14). 2. CT Perfusion detects a small infarct core of 13 mL with regional penumbra of 41 mL (28 mL mismatch). 3. Superimposed very bulky calcified plaque at the Right ICA origin with tandem HIGH-GRADE  STENOSIS, including Radiographic String Sign (series 7, image 216). 4. The above were reviewed in person with Dr. Roland Rack on 09/09/2020 at 1128 hours. 5. 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 6. Bulky Left Carotid bifurcation and proximal left ICA calcified plaque without significant stenosis. 7. Up to moderate Right Vertebral Artery origin stenosis. Electronically Signed   By: Genevie Ann M.D.   On: 09/09/2020 11:46    Labs:  CBC: Recent Labs    09/09/20 1101 09/09/20 1103 09/10/20 0606 09/11/20 0440 09/12/20 0514  WBC 6.2  --  8.2 7.6 6.6  HGB 11.2* 12.2* 9.6* 9.0* 9.1*  HCT 34.8* 36.0* 28.9* 27.7* 29.9*  PLT 124*  --  143* 123* 113*    COAGS: Recent Labs    09/09/20 1101  INR 1.0  APTT 32    BMP: Recent Labs    12/08/19 2250 12/12/19 1450 09/09/20 1101 09/09/20 1103 09/10/20 0606 09/11/20 0440 09/11/20 1112 09/11/20 1634 09/11/20 2218 09/12/20 0514  NA 138   < > 133* 136 136 137 137 141 143 145  K 4.6   < > 4.7 4.9 4.6 4.5  --   --   --  4.1  CL 102   < > 100 100 106 108  --   --   --  116*  CO2 25   < > 24  --  23 18*  --   --   --  20*  GLUCOSE 136*   < > 163* 158* 182* 201*  --   --   --  228*  BUN 41*   < > 35* 47* 34* 34*  --   --   --  29*  CALCIUM 8.9   < > 8.8*  --  8.2* 8.2*  --   --   --  8.2*  CREATININE 2.30*   < > 1.90* 1.80* 1.98* 1.85*  --   --   --  1.47*  GFRNONAA 26*   < > 35*  --  33* 36*  --   --   --  48*  GFRAA 30*  --   --   --   --   --   --   --   --   --    < > = values in this interval  not displayed.    LIVER FUNCTION TESTS: Recent Labs    08/21/20 0945 09/09/20 1101  BILITOT 0.4 0.7  AST 15 22  ALT 18 22  ALKPHOS 73 69  PROT 6.6 6.4*  ALBUMIN 3.9 3.6    Assessment and Plan:  History of acute CVA s/p cerebral arteriogram with emergent mechanical thrombectomy and stent placement of right MCA inferior division M2/M3 occlusion, along with angioplasty of right ICA stenosis,  achieving a TICI 3 revascularization via right femoral approach 09/09/2020 by Dr. Estanislado Pandy. Continue taking Brilinta 45 mg twice daily and Aspirin 81 mg once daily. Plan to follow-up with Dr. Estanislado Pandy in clinic 1 month after discharge (NIR schedulers to call patient to set up this appointment). Further plans per neurology- appreciate and agree with management. NIR to follow.   Electronically Signed: Ascencion Dike, PA-C 09/12/2020, 9:08 AM   I spent a total of 15 Minutes at the the patient's bedside AND on the patient's hospital floor or unit, greater than 50% of which was counseling/coordinating care for CVA s/p revascularization.

## 2020-09-12 NOTE — Progress Notes (Signed)
IP rehab admissions - per therapies today, patient is doing much better.  I have opened his case with insurance carrier and I have sent clinicals requesting acute inpatient rehab admission.  We will update all once we hear back from insurance carrier.  Call for questions.  [phone]

## 2020-09-12 NOTE — Progress Notes (Signed)
Pharmacy Antibiotic Note  Frank [name] is a 80 y.o. male admitted on 09/09/2020 with pneumonia.  Pharmacy has been consulted for Cefepime dosing.  ID: HAP. Tmax 99.7. WBC WNL.Scr 1. Choking event 3/3, consider aspiration. Cefepime 3/3>>  2/28: MRSA PCR: neg  Plan: Cefepime 2g IV q12 hrs    Height: 5\' 9"  (175.3 cm) Weight: 99 kg (218 lb 4.1 oz) IBW/kg (Calculated) : 70.7  Temp (24hrs), Avg:98.7 F (37.1 C), Min:97.6 F (36.4 C), Max:99.5 F (37.5 C)  Recent Labs  Lab 09/09/20 1101 09/09/20 1103 09/10/20 0606 09/11/20 0440 09/12/20 0514  WBC 6.2  --  8.2 7.6 6.6  CREATININE 1.90* 1.80* 1.98* 1.85* 1.47*    Estimated Creatinine Clearance: 45.7 mL/min (A) (by C-G formula based on SCr of 1.47 mg/dL (H)).    Allergies  Allergen Reactions   Lisinopril Cough   Invokana [Canagliflozin] Other (See Comments)    Stopped by MD   Penicillins Other (See Comments)    Allergic as a child.  Patient does not remember reaction.  Has had cephalasporins without problems.   Sulfamethoxazole Other (See Comments)    Unknown reaction- from childhood    Frank [name] S. Frank [name], PharmD, BCPS Clinical Staff Pharmacist [email or web address]  [name] 09/12/2020 2:50 PM

## 2020-09-12 NOTE — Consult Note (Signed)
NAME:  Frank [name], MRN:  [id number], DOB:  Jul 22, 1940, LOS: 3 ADMISSION DATE:  09/09/2020, CONSULTATION DATE:  3/3 REFERRING MD:  Leonie Man CHIEF COMPLAINT: Cough/dyspnea  Brief History:  80 year old male with a complex medical history brought from rehab with an MCA stroke.  Pulmonary and critical care medicine consulted in the setting of increased work of breathing felt to be related to aspiration pneumonitis.  CODE STATUS is DNI.  History of Present Illness:  This is an 80 year old male with an extensive past medical history who was admitted to [facility] on September 09, 2020 in the setting of a large MCA stroke.  He was admitted by the stroke service and has received care from them since arrival.  Because of the large stroke a small bore feeding tube was placed and speech therapy has been working with him.  Today nursing reported increasing cough and increasing work of breathing after he had been taking by mouth with assistance from family.  There is concerned that he had an aspiration event.  Pulmonary and critical care medicine was consulted because of the increased work of breathing and cough.  The patient is unable to give me an extensive history due to some confusion though he tells me that he has had shortness of breath today and he does not have this at baseline.  He says that to his knowledge she has never had asthma or COPD and he does not take any inhaled medicines at home.  He says he feels a bit short of breath right now.  He does not recall a specific event where he choked on food.  Most of the history is obtained by chart review.  Upon review of the record it seems that the patient was noted to have some difficulty standing and walking while at rehab.  He was found to have a right MCA M2 branch occlusion.  The interventional radiology team was activated and performed revascularization of an occluded dominant right MCA inferior division distal M2/3 artery with placement of  a stent and right ICA balloon angioplasty.  Secondary stroke work-up has been performed which showed a normal LVEF, hemoglobin A1c was elevated at 7.8, PT is recommended that he be placed in CIR.  His blood pressure has been maintained according to standard guidelines during this hospitalization.  He was started on hypertonic saline for concern for cerebral edema related to his MCA stroke.  This was started on March 2.  He has been maintained on Keppra 500 mg twice daily for concern for seizures as he was also noted to have a small subarachnoid hemorrhage on CT head after the procedure.  Clonidine patch was added on March 2.  Past Medical History:  Traumatic brain injury Obstructive sleep apnea Hypertension Hyperlipidemia GERD Diabetes mellitus type 2 AV block, Mobitz type II History of subdural hematoma with associated traumatic brain injury in 2019 status post evacuation. Allergic rhinitis  Significant Hospital Events:  February 28 admission for right MCA stroke, status post balloon angioplasty to right internal carotid artery and stent placement and right MCA M2/3 artery, concern for subarachnoid hemorrhage in right sylvian fissure and right frontotemporal convexity without mass-effect  Consults:  PCCM  Procedures:  2/28 IR thrombectomy  Significant Diagnostic Tests:  February 28 CT head positive for emergent occlusion of right MCA M2 artery with associated perfusion deficits and penumbra, superimposed very bulky calcified plaque right internal carotid artery, anterior communicating artery aneurysm, proximal left ICA calcification March 1 CT head: Increased  subarachnoid hemorrhage/contrast around the right sylvian fissure also with some interval parenchymal hemorrhage in the infarct territory with small volume intraventricular extension, right perisylvian cortical infarct correlating with affected vessel and perfusion maps, chronic subdural hematomas  March 1 MRI brain large  acute/subacute left-sided infarct with parenchymal hemorrhage measuring over 4 cm, pH to hemorrhage, element of acute right subarachnoid hemorrhage, chronic bilateral subdural hematomas, no significant midline shift March 1 CT head 7 PM, evolving right MCA territory infarction and hemorrhagic conversion, parenchymal sulcal subarachnoid intraventricular hemorrhage also present, bilateral subdural hematomas again identified a small volume hyperintense hemorrhage March 3 CT head large acute right MCA branch infarct without visible progression, related hemorrhages decreasing, stable subdural hematomas without significant mass-effect  Micro Data:  February 28 SARS-CoV-2/influenza negative  Antimicrobials:  March 3 cefepime  Interim History / Subjective:   as above  Objective   Blood pressure (!) 193/91, pulse 83, temperature 97.6 F (36.4 C), temperature source Oral, resp. rate 20, height 5\' 9"  (1.753 m), weight 99 kg, SpO2 100 %.        Intake/Output Summary (Last 24 hours) at 09/12/2020 1445 Last data filed at 09/12/2020 1415 Gross per 24 hour  Intake 2380.97 ml  Output 2750 ml  Net -369.03 ml   Filed Weights   09/09/20 1100 09/09/20 1129  Weight: 99 kg 99 kg    Examination: General:  Chronically ill appearing male, resting comfortably in bed HENT: NCAT OP clear PULM: Rhonchi bases B, normal effort CV: RRR, no mgr GI: BS+, soft, nontender MSK: normal bulk and tone Neuro: awake, converant but confused, detailed neuro exam not performed   Resolved Hospital Problem list     Assessment & Plan:  Acute onset cough, mild shortness of breath and mild hypoxemia on March 3 felt to be related to an aspiration event: At this time he is not showing signs of respiratory failure though he can certainly progress to this should he develop pneumonia. Keep n.p.o. Tube feeding only for now Continue speech therapy evaluation Check chest x-ray Given his high risk of death should he develop  pneumonia we will go ahead and start cefepime but if he does not have a fever or white count elevation within the next 24 hours would stop this  Cough: Due to aspiration event, vocal cord inflammation as evidenced by expiratory wheeze, likely exacerbated by baseline GERD Delsym as needed Mucinex scheduled for chest congestion Start pantoprazole  GERD Start PPI  Acute R MCA stroke: Management per stroke team Secondary stroke prevention per stroke team Hypertension management per stroke team  Cerebral edema: Hypertonic saline per neuro  Traumatic subarachnoid hemorrhage keppra for seizure prophylaxis per neuro  OSA:  discuss with family whether or not he uses CPAP at home  DM2 SSI + q4h CBG   Best practice (evaluated daily)  Diet: tube feeding Pain/Anxiety/Delirium protocol (if indicated): n/a VAP protocol (if indicated): n/a DVT prophylaxis: sub q heparin GI prophylaxis: pantoprazole Glucose control: As above Mobility: PT following Disposition: remain in ICU  Goals of Care:  Last date of multidisciplinary goals of care discussion:2/28 Family and staff present: family, neurology team Summary of discussion: DNI, CPR OK Follow up goals of care discussion due: 3/6 Code Status: DNI, CPR OK  Labs   CBC: Recent Labs  Lab 09/09/20 1101 09/09/20 1103 09/10/20 0606 09/11/20 0440 09/12/20 0514  WBC 6.2  --  8.2 7.6 6.6  NEUTROABS 3.9  --  6.7  --   --   HGB 11.2* 12.2*  9.6* 9.0* 9.1*  HCT 34.8* 36.0* 28.9* 27.7* 29.9*  MCV 84.1  --  82.6 85.2 86.2  PLT 124*  --  143* 123* 113*    Basic Metabolic Panel: Recent Labs  Lab 09/09/20 1101 09/09/20 1103 09/10/20 0606 09/11/20 0440 09/11/20 1112 09/11/20 1634 09/11/20 2218 09/12/20 0514 09/12/20 0946  NA 133* 136 136 137 137 141 143 145 147*  K 4.7 4.9 4.6 4.5  --   --   --  4.1  --   CL 100 100 106 108  --   --   --  116*  --   CO2 24  --  23 18*  --   --   --  20*  --   GLUCOSE 163* 158* 182* 201*  --   --    --  228*  --   BUN 35* 47* 34* 34*  --   --   --  29*  --   CREATININE 1.90* 1.80* 1.98* 1.85*  --   --   --  1.47*  --   CALCIUM 8.8*  --  8.2* 8.2*  --   --   --  8.2*  --   MG  --   --   --  2.0  --   --   --  1.7  --   PHOS  --   --   --  4.0  --   --   --  3.3  --    GFR: Estimated Creatinine Clearance: 45.7 mL/min (A) (by C-G formula based on SCr of 1.47 mg/dL (H)). Recent Labs  Lab 09/09/20 1101 09/10/20 0606 09/11/20 0440 09/12/20 0514  WBC 6.2 8.2 7.6 6.6    Liver Function Tests: Recent Labs  Lab 09/09/20 1101  AST 22  ALT 22  ALKPHOS 69  BILITOT 0.7  PROT 6.4*  ALBUMIN 3.6   No results for input(s): LIPASE, AMYLASE in the last 168 hours. No results for input(s): AMMONIA in the last 168 hours.  ABG    Component Value Date/Time   TCO2 27 09/09/2020 1103     Coagulation Profile: Recent Labs  Lab 09/09/20 1101  INR 1.0    Cardiac Enzymes: No results for input(s): CKTOTAL, CKMB, CKMBINDEX, TROPONINI in the last 168 hours.  HbA1C: Hgb A1c MFr Bld  Date/Time Value Ref Range Status  09/10/2020 06:06 AM 7.8 (H) 4.8 - 5.6 % Final    Comment:    (NOTE) Pre diabetes:          5.7%-6.4%  Diabetes:              >6.4%  Glycemic control for   <7.0% adults with diabetes   08/21/2020 09:45 AM 7.6 (H) 4.6 - 6.5 % Final    Comment:    Glycemic Control Guidelines for People with Diabetes:Non Diabetic:  <6%Goal of Therapy: <7%Additional Action Suggested:  >8%     CBG: Recent Labs  Lab 09/11/20 2006 09/11/20 2330 09/12/20 0344 09/12/20 0804 09/12/20 1147  GLUCAP 120* 162* 225* 218* 182*    Review of Systems:   Cannot obtain due to confusion  Past Medical History:  He,  has a past medical history of Allergy, Anemia, Arthritis, AV block, Mobitz 2 (05/14/2020), Diabetes mellitus, GERD (gastroesophageal reflux disease), Hyperlipidemia, Hypertension, Neuropathy (2001), OSA (obstructive sleep apnea), and TBI (traumatic brain injury) ([place]).   Surgical  History:   Past Surgical History:  Procedure Laterality Date  . APPENDECTOMY    . arm fracture  Left 2011   with hardware  . BURR HOLE Bilateral 08/04/2017   Procedure: Haskell Flirt;  Surgeon: [name], MD;  Location: [facility];  Service: Neurosurgery;  Laterality: Bilateral;  . COLONOSCOPY    . CRANIOTOMY Left 12/01/2017   Procedure: Trudee Kuster Holes CRANIOTOMY HEMATOMA EVACUATION SUBDURAL;  Surgeon: [name], MD;  Location: [facility];  Service: Neurosurgery;  Laterality: Left;  . ESOPHAGOGASTRODUODENOSCOPY  2006   gastritis  . IR PERCUTANEOUS ART THROMBECTOMY/INFUSION INTRACRANIAL INC DIAG ANGIO  09/09/2020  . PACEMAKER IMPLANT N/A 05/14/2020   Procedure: PACEMAKER IMPLANT;  Surgeon: [name], MD;  Location: [facility];  Service: Cardiovascular;  Laterality: N/A;  . RADIOLOGY WITH ANESTHESIA N/A 09/09/2020   Procedure: IR WITH ANESTHESIA;  Surgeon: Radiologist, Medication, MD;  Location: [facility];  Service: Radiology;  Laterality: N/A;  . ROTATOR CUFF REPAIR    . SEPTOPLASTY  1959   Deviated Septum  . 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 He smoked 1.00 pack per day. He has never used smokeless tobacco. He reports current alcohol use of about 2.0 standard drinks of alcohol per week. He reports that he does not use drugs.   Family History:  His family history includes Allergies in his father; Breast cancer in his mother; Coronary artery disease in his brother; Heart disease in his mother; Stroke in his father. There is no history of Diabetes or Colon cancer.   Allergies Allergies  Allergen Reactions  . Lisinopril Cough  . Invokana [Canagliflozin] Other (See Comments)    Stopped by MD  . Penicillins Other (See Comments)    Allergic as a child.  Patient does not remember reaction.  Has had cephalasporins without problems.  . Sulfamethoxazole Other (See Comments)    Unknown reaction- from childhood     Home  Medications  Prior to Admission medications   Medication Sig Start Date End Date Taking? Authorizing Provider  acetaminophen (TYLENOL) 325 MG tablet Take 1-2 tablets (325-650 mg total) by mouth every 4 (four) hours as needed for mild pain. 05/15/20  Yes Shirley Friar, PA-C  amLODipine (NORVASC) 10 MG tablet Take 10 mg by mouth at bedtime.   Yes [provider]  atorvastatin (LIPITOR) 20 MG tablet TAKE ONE-HALF TABLET BY  MOUTH DAILY Patient taking differently: Take 10 mg by mouth daily. 06/06/20  Yes Janith Lima, MD  azelastine (ASTELIN) 0.1 % nasal spray Place 1 spray into both nostrils 2 (two) times daily. Use in each nostril as directed 11/27/19  Yes Janith Lima, MD  Carbonyl Iron 25 MG TABS Take 25 mg by mouth daily with breakfast.    Yes [provider]  doxazosin (CARDURA) 4 MG tablet TAKE ONE-HALF TABLET BY  MOUTH AT BEDTIME Patient taking differently: Take 2 mg by mouth at bedtime. 06/06/20  Yes Janith Lima, MD  FLUoxetine (PROZAC) 20 MG capsule Take 1 capsule (20 mg total) by mouth daily. 04/10/20  Yes Janith Lima, MD  glipiZIDE (GLUCOTROL XL) 10 MG 24 hr tablet Take 1 tablet (10 mg total) by mouth daily. 04/10/20  Yes Janith Lima, MD  Glucagon (GVOKE HYPOPEN 2-PACK) 1 MG/0.2ML SOAJ Inject 1 Act into the skin daily as needed. Patient taking differently: Inject 1 each into the skin as needed (low blood sugar). 04/30/20  Yes Janith Lima, MD  insulin glargine, 2 Unit Dial, (TOUJEO MAX SOLOSTAR) 300  UNIT/ML Solostar Pen Inject 10 Units into the skin daily. 04/16/20  Yes Janith Lima, MD  Insulin Pen Needle 32G X 6 MM MISC 1 Act by Does not apply route daily. 04/08/20  Yes Janith Lima, MD  levocetirizine (XYZAL) 5 MG tablet Take 1 tablet (5 mg total) by mouth every evening. 11/27/19  Yes Janith Lima, MD  mupirocin ointment (BACTROBAN) 2 % Apply 1 application topically 2 (two) times daily. 05/31/20  Yes Marrian Salvage, FNP   pantoprazole (PROTONIX) 40 MG tablet TAKE 1 TABLET BY MOUTH  DAILY Patient taking differently: Take 40 mg by mouth daily. 04/30/20  Yes Janith Lima, MD  PRESCRIPTION MEDICATION CPAP- At bedtime   Yes [provider]  psyllium (METAMUCIL) 58.6 % powder Take 1 packet by mouth daily as needed (for constipation- MIX AND DRINK).    Yes [provider]  solifenacin (VESICARE) 10 MG tablet TAKE 1 TABLET BY MOUTH  DAILY Patient taking differently: Take 10 mg by mouth daily. 08/09/20  Yes Janith Lima, MD  telmisartan (MICARDIS) 20 MG tablet TAKE 1 TABLET BY MOUTH  DAILY Patient taking differently: Take 20 mg by mouth daily. 09/07/20  Yes Janith Lima, MD  triamcinolone cream (KENALOG) 0.1 % Apply 1 application topically daily as needed (to affected areas).  07/26/19  Yes [provider]     Critical care time: n/a    Roselie Awkward, MD [facility] Pager: [phone] Cell: [phone] If no response, please call [phone] until 7pm After 7:00 pm call Elink  [phone]

## 2020-09-12 NOTE — Progress Notes (Addendum)
STROKE TEAM PROGRESS NOTE   INTERVAL HISTORY His wife is at the bedside. Improved alertness and mentation today. Discussed stroke diagnosis and plan of care. Questions answered.  Serum sodium is 145 on hypertonic saline.  Neurological exam improved from yesterday but still has mild left hemiparesis.  . New observation of erythema of the left upper extremity mild swelling but no tenderness and good distal pulses. Vitals:   09/12/20 0400 09/12/20 0500 09/12/20 0600 09/12/20 0700  BP: (!) 153/68 (!) 159/70 (!) 155/70 (!) 161/69  Pulse: 91 89 82 80  Resp: 17 20 (!) 23 (!) 22  Temp: 98.7 F (37.1 C)     TempSrc: Axillary     SpO2: 93% 92% 93% 92%  Weight:      Height:       CBC:  Recent Labs  Lab 09/09/20 1101 09/09/20 1103 09/10/20 0606 09/11/20 0440 09/12/20 0514  WBC 6.2  --  8.2 7.6 6.6  NEUTROABS 3.9  --  6.7  --   --   HGB 11.2*   < > 9.6* 9.0* 9.1*  HCT 34.8*   < > 28.9* 27.7* 29.9*  MCV 84.1  --  82.6 85.2 86.2  PLT 124*  --  143* 123* 113*   < > = values in this interval not displayed.   Basic Metabolic Panel:  Recent Labs  Lab 09/11/20 0440 09/11/20 1112 09/11/20 2218 09/12/20 0514  NA 137   < > 143 145  K 4.5  --   --  4.1  CL 108  --   --  116*  CO2 18*  --   --  20*  GLUCOSE 201*  --   --  228*  BUN 34*  --   --  29*  CREATININE 1.85*  --   --  1.47*  CALCIUM 8.2*  --   --  8.2*  MG 2.0  --   --  1.7  PHOS 4.0  --   --  3.3   < > = values in this interval not displayed.   Lipid Panel:  Recent Labs  Lab 09/10/20 0606  CHOL 107  TRIG 210*  HDL 36*  CHOLHDL 3.0  VLDL 42*  LDLCALC 29   HgbA1c:  Recent Labs  Lab 09/10/20 0606  HGBA1C 7.8*   Urine Drug Screen: pending  Result Date 09/10/2020 Mri brain/ MRA head 1. Large acute/subacute left-sided infarct with parenchymal hemorrhage measuring over 4 cm. PH2 hemorrhage. 2. Element of acute right subarachnoid hemorrhage. 3. Chronic bilateral subdural hematomas. 4. No significant midline  shift. IMAGING past 24 hours CT HEAD WO CONTRAST  Result Date: 09/10/2020 IMPRESSION:  1. Increased subarachnoid hemorrhage/contrast around the right sylvian fissure. There is also some interval parenchymal hemorrhage in the infarct territory with small volume intraventricular extension. No hydrocephalus or midline shift.  2. Right perisylvian cortical infarct correlating with affected vessel and the perfusion maps.  3. Chronic subdural hematomas.   DG Abd Portable 1V  Result Date: 09/09/2020 Nasogastric tube not identified within the visualized distal esophagus and abdomen. Chest radiograph may be helpful to identify the location of a malpositioned catheter.   Result Date: 09/09/2020 ECHOCARDIOGRAM REPORT   1. Left ventricular ejection fraction, by estimation, is 65 to 70%. The left ventricle has normal function. The left ventricle has no regional wall motion abnormalities. There is severe concentric left ventricular hypertrophy. Left ventricular diastolic  parameters are consistent with Grade I diastolic dysfunction (impaired relaxation). Elevated left atrial pressure.  2. Right ventricular systolic function is normal. The right ventricular size is mildly enlarged. There is normal pulmonary artery systolic pressure.   3. The mitral valve is normal in structure. No evidence of mitral valve regurgitation. Mild mitral stenosis. The mean mitral valve gradient is 3.0 mmHg. Moderate mitral annular calcification.   4. The aortic valve is normal in structure. There is severe calcifcation of the aortic valve. There is severe thickening of the aortic valve. Aortic valve regurgitation is not visualized. Mild aortic valve stenosis. Aortic valve mean gradient measures 9.0 mmHg.   5. The inferior vena cava is normal in size with greater than 50% respiratory variability, suggesting right atrial pressure of 3 mmHg.   CT ANGIO Head/neck CT perfusion CODE STROKE Result Date: 09/09/2020  IMPRESSION:  1.  Positive for emergent occlusion of a posterior Right MCA distal M2 branch at its bifurcation. Embolized calcification there is new since last year (series 12, image 14).  2. CT Perfusion detects a small infarct core of 13 mL with regional penumbra of 41 mL (28 mL mismatch).  3. Superimposed very bulky calcified plaque at the Right ICA origin with tandem HIGH-GRADE STENOSIS, including Radiographic String Sign (series 7, image 216).  4. 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  5. Bulky Left Carotid bifurcation and proximal left ICA calcified plaque without significant stenosis.  6. Up to moderate Right Vertebral Artery origin stenosis.    PHYSICAL EXAM Obese elderly Caucasian male not in distress. . Afebrile. Head is nontraumatic. Neck is supple without bruit.    Cardiac exam no murmur or gallop. Lungs are clear to auscultation. Distal pulses are well felt. Neurological Exam : Patient is awake and interactive today.  Follows simple midline and one-step commands.  He has significant dysarthria and has right gaze preference but able to look to the left of midline.  He blinks to threat on the right but not on the left.  Mild left lower facial weakness.  Tongue midline.  Motor system exam shows left hemiparesis with 2/5 strength and able to lift arm and leg off the bed unable to sustain the response..  Sensation is diminished in the left compared to the right .mild left hemineglect   ASSESSMENT/PLAN Mr. Jovany Disano is a 80 y.o. 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  Was found to have a right MCA M2 branch occlusion.  .   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  ASPECTS is 10.    CTA head cclusion of a posterior Right MCA distal M2 branch at its bifurcation.  CTA neck right ICA origin with tandem HIGH-GRADE STENOSIS  CT perfusion small infarct core of 13 mL with regional penumbra of 41 mL (28 mL mismatch).   MRI/MRA h: Large acute/subacute left-sided infarct with parenchymal       hemorrhage measuring over 4 cm. PH2 hemorrhage.  Element of acute right subarachnoid hemorrhage.  Chronic bilateral subdural hematomas.  2D Echo EF 65-70%, severe LVH, Grade I diastolic dysfunction  LDL 29  HgbA1c 7.8  VTE prophylaxis -  SCDs, Heparin 5000units q 8hr NPO: speech consulted  No antithrombotic prior to admission, now on aspirin 81 mg daily and ticagrelor 45mg  daily (reduced secondary to [facility])  Therapy recommendations:  PT: CIR  Disposition:  Pending  Follow-up with Dr. Estanislado Pandy in clinic 1 month after discharge  Subarachnoid Hemorrhage  S/p Thrombectomy, stent placement distal M2, balloon angioplasty  CT head: Increased subarachnoid hemorrhage the right sylvian fissure. parenchymal hemorrhage in the infarct territory with small volume intraventricular extension.   SBP goal changed to <160  Normal saline 166ml/hr  Cerebral edema  3% sodium chloride at 31ml/hr    Follow sodium q 6 hrs  Sodium goal 150-155  Dysphagia  Likely due to stroke  Failed swallow eval  Cortrak placed   Seizure history  Previously on Keppra due to seizures but eventually weaned off   Keppra decreased 3/2 to 500mg  bid for prophylaxis due to somnolence  EEG (3/2): suggestive of cortical dysfunction in right hemisphere likely secondary to underlying infarct.  There is also evidence of breach artifact in left centro- temporal region consistent with prior craniotomy.  Additionally there is  evidence of moderate diffuse encephalopathy, nonspecific etiology.  No seizures or definite epileptiform discharges were seen throughout the recording.  Hypertension  Home meds:  Amlodipine 10mg  daily, Cadura 2mg  at bedtime, Micardis 20mg  daily  Unstable, requiring Cleviprex infusion, wean today   Clonodine patch added 3/2  SBP goal <160 in the setting of SAH, ICH  hydralazine 20mg  q6 hours, labetolol 20 q2 prn  Long-term BP goal normotensive  Hyperlipidemia  Home meds: atorvastatin 20 daily  LDL 29, goal < 70  Started on atorvastatin daily as he is daily   Continue statin at discharge  Diabetes type II Uncontrolled  Home meds:  Glipizide 10mg  daily, glargine 10units daily  HgbA1c 7.8, goal < 7.0  CBGs Recent Labs    09/11/20 2330 09/12/20 0344 09/12/20 0804  GLUCAP 162* 225* 218*      SSI  lantus 5 units qHS, Novolog 2units q 4 hours   Appreciate diabetes coordinator recommendations  Chronic Kidney Injury III  Likely due to contrast load for imaging/IR and dehydration  Creatinine level 1.98 -> 1.85->1.47  Adequate uop  MIVF stopped   Follow uop closely  Other Stroke Risk Factors  Advanced Age >/= 8   Obesity, Body mass index is 32.23 kg/m., BMI >/= 30 associated with increased stroke risk, recommend weight loss, diet and exercise as appropriate   Coronary artery disease AV block, Mobitz 2, pacer  Obstructive sleep apnea, unclear if he is on CPAP at home  Other Active Problems  Anxiety/depression: prozac  GERD: protonix  Anemia: iron deficient and B12 deficient Continue hypertonic saline at the current rate and will not chase the level since patient has shown improvement.  Remains stable will consider tapering tomorrow and stopping.  On discussion patient wife at the bedside and answered questions.Danne Baxter observation of left upper extremity erythema and redness but without tenderness.  Recommend observation with elevation and warm  compresses for now. Discussed with Dr. Ernest Mallick critical care medicine. This patient is critically ill and at significant risk of neurological worsening, death and care requires constant monitoring of vital signs, hemodynamics,respiratory and cardiac monitoring, extensive review of multiple databases, frequent neurological assessment, discussion with family, other specialists and medical decision making of high complexity.I have made any additions or clarifications directly to the above note.This critical care time does not reflect procedure time, or teaching time or supervisory time  of PA/NP/Med Resident etc but could involve care discussion time.  I spent 30 minutes of neurocritical care time  in the care of  this patient.      Antony Contras, MD   To contact Stroke Continuity provider, please refer to [email or web address] After hours, contact General Neurology

## 2020-09-12 NOTE — H&P (Incomplete)
Physical Medicine and Rehabilitation Admission H&P    Chief Complaint  Patient presents with  . 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  Per chart review lives with spouse.  1 level home 4 steps to entry.  He has a caregiver 5 days a week from 8 to 4 PM.  He had been receiving outpatient therapies twice a week.  Patient does not drive.  Independent with assistive device prior to admission.  Presented 09/09/2020 with acute onset of right-sided weakness and facial droop.  Cranial CT scan redemonstrated subdural hematomas overlying the bilateral cerebral convexities.  As before these hematomas measured 9 mm in greatest thickness.  Although intermediate to low density the density of the right subdural hematoma had subtly increased from examination of 02/08/2020.  No midline shift.  CT angiogram of head and neck positive for emergent occlusion of posterior right MCA distal M2 branch at its bifurcation.  Patient underwent mechanical thrombectomy placement of rescue right ICA stent balloon angioplasty per interventional radiology.  Latest MRI/MRI showed large acute subacute left side infarction with parenchymal hemorrhage measuring 4 cm.  Element of acute right subarachnoid hemorrhage.  Chronic bilateral subdural hematoma.  No significant midline shift.  Echocardiogram with ejection fraction of 65 to 60% grade 1 diastolic dysfunction.  Maintain on low-dose aspirin as well as Brilinta for CVA prophylaxis.  Keppra for seizure prophylaxis.  He was initially on Cleviprex for blood pressure control.  Patient is currently n.p.o. with alternative means of nutritional support.  Therapy evaluations  completed due to patient decreased functional mobility and was admitted for a comprehensive rehab program.  Review of Systems  Constitutional: Negative for chills and fever.  HENT: Negative for hearing loss.   Eyes: Negative for blurred vision and double vision.  Respiratory: Negative for shortness of breath.   Cardiovascular: Positive for palpitations and leg swelling.  Gastrointestinal: Positive for constipation. Negative for heartburn, nausea and vomiting.       GERD  Genitourinary: Positive for urgency. Negative for dysuria, flank pain and hematuria.  Musculoskeletal: Positive for joint pain and myalgias.  Skin: Negative for rash.  Neurological: Positive for weakness and headaches.  All other systems reviewed and are negative.  Past Medical History:  Diagnosis Date  . Allergy    Rhinitis  . Anemia    NOS iron deficient and B12 deficient  . Arthritis   . 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 Diabetes mellitus    Type 2  . GERD (gastroesophageal reflux disease)   . Hyperlipidemia   . Hypertension   . 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 OSA (obstructive sleep apnea)    cpap  . TBI (traumatic brain injury) [facility])    Past Surgical History:  Procedure Laterality Date  . APPENDECTOMY    . arm fracture Left 2011   with hardware  . BURR HOLE Bilateral 08/04/2017   Procedure: Haskell Flirt;  Surgeon: [name], MD;  Location: [facility];  Service: Neurosurgery;  Laterality: Bilateral;  . COLONOSCOPY    . CRANIOTOMY Left 12/01/2017   Procedure: Trudee Kuster Holes CRANIOTOMY HEMATOMA EVACUATION SUBDURAL;  Surgeon: [name], MD;  Location: [place];  Service: Neurosurgery;  Laterality: Left;  . ESOPHAGOGASTRODUODENOSCOPY  2006   gastritis  . IR PERCUTANEOUS ART THROMBECTOMY/INFUSION INTRACRANIAL INC DIAG ANGIO  09/09/2020  . PACEMAKER IMPLANT N/A 05/14/2020   Procedure: PACEMAKER IMPLANT;  Surgeon: [name], MD;  Location: [facility];  Service: Cardiovascular;  Laterality: N/A;  .  RADIOLOGY WITH ANESTHESIA N/A 09/09/2020   Procedure: IR WITH ANESTHESIA;  Surgeon: Radiologist, Medication, MD;  Location: [facility];  Service: Radiology;  Laterality: N/A;  . ROTATOR CUFF REPAIR    . SEPTOPLASTY  1959   Deviated Septum  . 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 Heart disease Mother   . Breast cancer Mother   . Stroke Father   . Allergies Father        Father and children  . Coronary artery disease Brother   . Diabetes Neg Hx   . Colon cancer Neg Hx    Social History:  reports that he quit smoking about 37 years ago. He smoked 1.00 pack per day. He has never used smokeless tobacco. He reports current alcohol use of about 2.0 standard drinks of alcohol per week. He reports that he does not use drugs. Allergies:  Allergies  Allergen Reactions  . Lisinopril Cough  . Invokana [Canagliflozin] Other (See Comments)    Stopped by MD  . Penicillins Other (See Comments)    Allergic as a child.  Patient does not remember reaction.  Has had cephalasporins without problems.  . Sulfamethoxazole Other (See Comments)    Unknown reaction- from childhood   Facility-Administered Medications Prior to Admission  Medication Dose Route Frequency Provider Last Rate Last Admin  . cyanocobalamin ((VITAMIN B-12)) injection 1,000 mcg  1,000 mcg Intramuscular Q30 days Janith Lima, MD   1,000 mcg at 08/21/20 0935   Medications Prior to Admission  Medication Sig Dispense Refill  . acetaminophen (TYLENOL) 325 MG tablet Take 1-2 tablets (325-650 mg total) by mouth every 4 (four) hours as needed for mild pain.    [name] amLODipine (NORVASC) 10 MG tablet Take 10 mg by mouth at bedtime.    [name] atorvastatin (LIPITOR) 20 MG tablet TAKE ONE-HALF TABLET BY  MOUTH DAILY (Patient taking differently: Take 10 mg by mouth daily.) 45 tablet 3  . azelastine (ASTELIN) 0.1 % nasal spray Place 1 spray into both nostrils 2 (two) times daily. Use in each nostril as  directed 90 mL 1  . Carbonyl Iron 25 MG TABS Take 25 mg by mouth daily with breakfast.     . doxazosin (CARDURA) 4 MG tablet TAKE ONE-HALF TABLET BY  MOUTH AT BEDTIME (Patient taking differently: Take 2 mg by mouth at bedtime.) 45 tablet 3  . FLUoxetine (PROZAC) 20 MG capsule Take 1 capsule (20 mg total) by mouth daily. 90 capsule 1  . glipiZIDE (GLUCOTROL XL) 10 MG 24 hr tablet Take 1 tablet (10 mg total) by mouth daily. 90 tablet 1  . Glucagon (GVOKE HYPOPEN 2-PACK) 1 MG/0.2ML SOAJ Inject 1 Act into the skin daily as needed. (Patient taking differently: Inject 1 each into the skin as needed (low blood sugar).) 2 mL 5  . insulin glargine, 2 Unit Dial, (TOUJEO MAX SOLOSTAR) 300 UNIT/ML Solostar Pen Inject 10 Units into the skin daily. 6 mL 1  . Insulin Pen Needle 32G X 6 MM MISC 1 Act by Does not apply route daily. 100 each 1  . levocetirizine (XYZAL) 5 MG tablet Take 1 tablet (5 mg total) by mouth every  evening. 90 tablet 1  . mupirocin ointment (BACTROBAN) 2 % Apply 1 application topically 2 (two) times daily. 22 g 0  . pantoprazole (PROTONIX) 40 MG tablet TAKE 1 TABLET BY MOUTH  DAILY (Patient taking differently: Take 40 mg by mouth daily.) 90 tablet 1  . PRESCRIPTION MEDICATION CPAP- At bedtime    . psyllium (METAMUCIL) 58.6 % powder Take 1 packet by mouth daily as needed (for constipation- MIX AND DRINK).     [name] solifenacin (VESICARE) 10 MG tablet TAKE 1 TABLET BY MOUTH  DAILY (Patient taking differently: Take 10 mg by mouth daily.) 90 tablet 1  . telmisartan (MICARDIS) 20 MG tablet TAKE 1 TABLET BY MOUTH  DAILY (Patient taking differently: Take 20 mg by mouth daily.) 90 tablet 1  . triamcinolone cream (KENALOG) 0.1 % Apply 1 application topically daily as needed (to affected areas).       Drug Regimen Review Drug regimen was reviewed and remains appropriate with no significant issues identified  Home: Home Living Family/patient expects to be discharged to:: Private residence Living  Arrangements: Spouse/significant other,Other (Comment) Available Help at Discharge: Family,Personal care attendant,Other (Comment) Type of Home: House Home Access: Stairs to enter [employer] of Steps: 4 Entrance Stairs-Rails: Can reach both Home Layout: One level Bathroom Shower/Tub: Multimedia programmer: Handicapped height Bathroom Accessibility: Yes Home Equipment: Environmental consultant - 2 wheels,Shower seat,Grab bars - tub/shower,Grab bars - toilet,Cane - single point,Walker - 4 wheels,Wheelchair - manual   Functional History: Prior Function Level of Independence: Independent with assistive device(s) Comments: Does own ADLs. Has a caregiver coming 5 days/week from 8-4 pm. Just got rid of night time nurse recently. Going to [place] twice/week for balance. Does not drive. Goes out to lunch with friends. Doing really well per wife.  Functional Status:  Mobility: Bed Mobility Overal bed mobility: Needs Assistance Bed Mobility: Rolling,Supine to Sit Rolling: Mod assist Supine to sit: Max assist,HOB elevated Sit to supine: Mod assist General bed mobility comments: pt requires step by step assist to bring LE toward L side. Pt needs hand over hand to look toward L side. Pt requires (A) to elevate trunk off bed surface. Pt static sitting Mod with L lateral lean Transfers Overall transfer level: Needs assistance Equipment used: 2 person hand held assist Transfers: Sit to/from [employer] Sit to Stand: +2 physical assistance,Mod assist Stand pivot transfers: +2 physical assistance,Mod assist General transfer comment: Pt transfering L side was unable to step toward the R side. pt requires total +2 Mod to swing hips toward chair. Pt sit<>stand x6 this session Ambulation/Gait Ambulation/Gait assistance: Mod assist,+2 physical assistance Gait Distance (Feet): 3 Feet Assistive device: 2 person hand held assist Gait Pattern/deviations: Wide base of support,Shuffle General  Gait Details: Able to take a few steps along side bed with assist for weight shifting, balance and left knee stability due to multiple episodes of buckling on left. Gait velocity: decreased    ADL: ADL Overall ADL's : Needs assistance/impaired Eating/Feeding: NPO Grooming: Maximal assistance Grooming Details (indicate cue type and reason): oral care opens mouth appropriately, asking for water Upper Body Bathing: Maximal assistance Lower Body Bathing: Maximal assistance Upper Body Dressing : Maximal assistance Lower Body Dressing: Maximal assistance Toilet Transfer: +2 for physical assistance,Moderate assistance General ADL Comments: pt restless in the bed and motivated to sit EOB. pt attempting R side exit iwth therapist on L side. Pt with L inattention noted  Cognition: Cognition Overall Cognitive Status: Difficult to assess Orientation Level: Oriented X4  Cognition Arousal/Alertness: Awake/alert Behavior During Therapy: Flat affect,Impulsive Overall Cognitive Status: Difficult to assess Area of Impairment: Following commands,Awareness,Safety/judgement Orientation Level: Disoriented to,Situation,Time Current Attention Level: Sustained Following Commands: Follows one step commands inconsistently,Follows one step commands with increased time Safety/Judgement: Decreased awareness of safety,Decreased awareness of deficits Awareness: Intellectual Problem Solving: Requires verbal cues,Requires tactile cues,Slow processing,Decreased initiation,Difficulty sequencing General Comments: pt following 1 step commands inconsistently.pt expressed need to go to bathroom but not voiding with standing Difficult to assess due to: Impaired communication  Physical Exam: Blood pressure (!) 193/91, pulse 83, temperature 97.6 F (36.4 C), temperature source Oral, resp. rate 20, height 5\' 9"  (1.753 m), weight 99 kg, SpO2 100 %. Physical Exam Neurological:     Comments: Patient was alert in no acute  distress.  Made eye contact with examiner.  Provide his name and age.  Follows simple commands.  Limited awareness of deficits.     Results for orders placed or performed during the hospital encounter of 09/09/20 (from the past 48 hour(s))  Glucose, capillary     Status: Abnormal   Collection Time: 09/10/20  5:30 PM  Result Value Ref Range   Glucose-Capillary 180 (H) 70 - 99 mg/dL    Comment: Glucose reference range applies only to samples taken after fasting for at least 8 hours.  Urine rapid drug screen (hosp performed)     Status: None   Collection Time: 09/10/20  7:04 PM  Result Value Ref Range   Opiates NONE DETECTED NONE DETECTED   Cocaine NONE DETECTED NONE DETECTED   Benzodiazepines NONE DETECTED NONE DETECTED   Amphetamines NONE DETECTED NONE DETECTED   Tetrahydrocannabinol NONE DETECTED NONE DETECTED   Barbiturates NONE DETECTED NONE DETECTED    Comment: (NOTE) DRUG SCREEN FOR MEDICAL PURPOSES ONLY.  IF CONFIRMATION IS NEEDED FOR ANY PURPOSE, NOTIFY LAB WITHIN 5 DAYS.  LOWEST DETECTABLE LIMITS FOR URINE DRUG SCREEN Drug Class                     Cutoff (ng/mL) Amphetamine and metabolites    1000 Barbiturate and metabolites    200 Benzodiazepine                 681 Tricyclics and metabolites     300 Opiates and metabolites        300 Cocaine and metabolites        300 THC                            50 Performed at [facility], [place] [address], [place], [place] [address]   Glucose, capillary     Status: Abnormal   Collection Time: 09/10/20  7:53 PM  Result Value Ref Range   Glucose-Capillary 176 (H) 70 - 99 mg/dL    Comment: Glucose reference range applies only to samples taken after fasting for at least 8 hours.  Glucose, capillary     Status: Abnormal   Collection Time: 09/10/20 11:36 PM  Result Value Ref Range   Glucose-Capillary 158 (H) 70 - 99 mg/dL    Comment: Glucose reference range applies only to samples taken after fasting for at least 8 hours.   Glucose, capillary     Status: Abnormal   Collection Time: 09/11/20  4:01 AM  Result Value Ref Range   Glucose-Capillary 173 (H) 70 - 99 mg/dL    Comment: Glucose reference range applies only to samples taken after fasting for  at least 8 hours.  CBC     Status: Abnormal   Collection Time: 09/11/20  4:40 AM  Result Value Ref Range   WBC 7.6 4.0 - 10.5 K/uL   RBC 3.25 (L) 4.22 - 5.81 MIL/uL   Hemoglobin 9.0 (L) 13.0 - 17.0 g/dL   HCT 27.7 (L) 39.0 - 52.0 %   MCV 85.2 80.0 - 100.0 fL   MCH 27.7 26.0 - 34.0 pg   MCHC 32.5 30.0 - 36.0 g/dL   RDW 13.5 11.5 - 15.5 %   Platelets 123 (L) 150 - 400 K/uL   nRBC 0.0 0.0 - 0.2 %    Comment: Performed at [facility] [address] [place], [place] [address]  Basic metabolic panel     Status: Abnormal   Collection Time: 09/11/20  4:40 AM  Result Value Ref Range   Sodium 137 135 - 145 mmol/L   Potassium 4.5 3.5 - 5.1 mmol/L   Chloride 108 98 - 111 mmol/L   CO2 18 (L) 22 - 32 mmol/L   Glucose, Bld 201 (H) 70 - 99 mg/dL    Comment: Glucose reference range applies only to samples taken after fasting for at least 8 hours.   BUN 34 (H) 8 - 23 mg/dL   Creatinine, Ser 1.85 (H) 0.61 - 1.24 mg/dL   Calcium 8.2 (L) 8.9 - 10.3 mg/dL   GFR, Estimated 36 (L) >60 mL/min    Comment: (NOTE) Calculated using the CKD-EPI Creatinine Equation (2021)    Anion gap 11 5 - 15    Comment: Performed at [facility] [address] [place], [place] [address]  Magnesium     Status: None   Collection Time: 09/11/20  4:40 AM  Result Value Ref Range   Magnesium 2.0 1.7 - 2.4 mg/dL    Comment: Performed at [facility] [address], [place], [place] [address]  Phosphorus     Status: None   Collection Time: 09/11/20  4:40 AM  Result Value Ref Range   Phosphorus 4.0 2.5 - 4.6 mg/dL    Comment: Performed at [facility] [address] [place], [place] [address]  Glucose, capillary     Status: Abnormal   Collection Time: 09/11/20  8:29  AM  Result Value Ref Range   Glucose-Capillary 214 (H) 70 - 99 mg/dL    Comment: Glucose reference range applies only to samples taken after fasting for at least 8 hours.  Sodium     Status: None   Collection Time: 09/11/20 11:12 AM  Result Value Ref Range   Sodium 137 135 - 145 mmol/L    Comment: Performed at [facility], [place] [address], [place], [place] [address]  Glucose, capillary     Status: Abnormal   Collection Time: 09/11/20 11:43 AM  Result Value Ref Range   Glucose-Capillary 204 (H) 70 - 99 mg/dL    Comment: Glucose reference range applies only to samples taken after fasting for at least 8 hours.  Glucose, capillary     Status: Abnormal   Collection Time: 09/11/20  3:41 PM  Result Value Ref Range   Glucose-Capillary 105 (H) 70 - 99 mg/dL    Comment: Glucose reference range applies only to samples taken after fasting for at least 8 hours.  Sodium     Status: None   Collection Time: 09/11/20  4:34 PM  Result Value Ref Range   Sodium 141 135 - 145  mmol/L    Comment: Performed at [facility], [place] [address] [place], [place] [address]  Glucose, capillary     Status: Abnormal   Collection Time: 09/11/20  8:06 PM  Result Value Ref Range   Glucose-Capillary 120 (H) 70 - 99 mg/dL    Comment: Glucose reference range applies only to samples taken after fasting for at least 8 hours.  Sodium     Status: None   Collection Time: 09/11/20 10:18 PM  Result Value Ref Range   Sodium 143 135 - 145 mmol/L    Comment: Performed at [facility] [address] [place], [place] [address]  Glucose, capillary     Status: Abnormal   Collection Time: 09/11/20 11:30 PM  Result Value Ref Range   Glucose-Capillary 162 (H) 70 - 99 mg/dL    Comment: Glucose reference range applies only to samples taken after fasting for at least 8 hours.  Glucose, capillary     Status: Abnormal   Collection Time: 09/12/20  3:44 AM  Result Value Ref Range   Glucose-Capillary 225 (H) 70 - 99  mg/dL    Comment: Glucose reference range applies only to samples taken after fasting for at least 8 hours.  CBC     Status: Abnormal   Collection Time: 09/12/20  5:14 AM  Result Value Ref Range   WBC 6.6 4.0 - 10.5 K/uL   RBC 3.47 (L) 4.22 - 5.81 MIL/uL   Hemoglobin 9.1 (L) 13.0 - 17.0 g/dL   HCT 29.9 (L) 39.0 - 52.0 %   MCV 86.2 80.0 - 100.0 fL   MCH 26.2 26.0 - 34.0 pg   MCHC 30.4 30.0 - 36.0 g/dL   RDW 13.8 11.5 - 15.5 %   Platelets 113 (L) 150 - 400 K/uL    Comment: Immature Platelet Fraction may be clinically indicated, consider ordering this additional test [id number] REPEATED TO VERIFY PLATELET COUNT CONFIRMED BY SMEAR    nRBC 0.0 0.0 - 0.2 %    Comment: Performed at [facility], [place] [address] [place], [place] [address]  Basic metabolic panel     Status: Abnormal   Collection Time: 09/12/20  5:14 AM  Result Value Ref Range   Sodium 145 135 - 145 mmol/L   Potassium 4.1 3.5 - 5.1 mmol/L   Chloride 116 (H) 98 - 111 mmol/L   CO2 20 (L) 22 - 32 mmol/L   Glucose, Bld 228 (H) 70 - 99 mg/dL    Comment: Glucose reference range applies only to samples taken after fasting for at least 8 hours.   BUN 29 (H) 8 - 23 mg/dL   Creatinine, Ser 1.47 (H) 0.61 - 1.24 mg/dL   Calcium 8.2 (L) 8.9 - 10.3 mg/dL   GFR, Estimated 48 (L) >60 mL/min    Comment: (NOTE) Calculated using the CKD-EPI Creatinine Equation (2021)    Anion gap 9 5 - 15    Comment: Performed at [facility] [address], [place], [place] [address]  Magnesium     Status: None   Collection Time: 09/12/20  5:14 AM  Result Value Ref Range   Magnesium 1.7 1.7 - 2.4 mg/dL    Comment: Performed at [facility] [address] [place], [place] [address]  Phosphorus     Status: None   Collection Time: 09/12/20  5:14 AM  Result Value Ref Range   Phosphorus 3.3 2.5 - 4.6 mg/dL    Comment:  Performed at [facility], [place] [address], [place], [place] [address]  Glucose, capillary     Status:  Abnormal   Collection Time: 09/12/20  8:04 AM  Result Value Ref Range   Glucose-Capillary 218 (H) 70 - 99 mg/dL    Comment: Glucose reference range applies only to samples taken after fasting for at least 8 hours.  Sodium     Status: Abnormal   Collection Time: 09/12/20  9:46 AM  Result Value Ref Range   Sodium 147 (H) 135 - 145 mmol/L    Comment: Performed at [facility], [place] [address] [place], [place] [address]  Glucose, capillary     Status: Abnormal   Collection Time: 09/12/20 11:47 AM  Result Value Ref Range   Glucose-Capillary 182 (H) 70 - 99 mg/dL    Comment: Glucose reference range applies only to samples taken after fasting for at least 8 hours.   CT Head Wo Contrast  Result Date: 09/12/2020 CLINICAL DATA:  Change in mental status.  Follow-up EXAM: CT HEAD WITHOUT CONTRAST TECHNIQUE: Contiguous axial images were obtained from the base of the skull through the vertex without intravenous contrast. COMPARISON:  Two days ago FINDINGS: Brain: Decreased subarachnoid and parenchymal hemorrhage on the right. No progressive swelling at the large right MCA branch infarct. No hydrocephalus. Small bilateral subdural hematoma with scattered high-density areas likely from septation. Vascular: No hyperdense vessel or unexpected calcification. Skull: Postoperative calvarium. Sinuses/Orbits: Negative IMPRESSION: 1. Large acute right MCA branch infarct without visible progression. Related hemorrhage is decreasing. 2. Stable subdural hematomas without significant mass effect. Electronically Signed   By: Monte Fantasia M.D.   On: 09/12/2020 09:56   CT HEAD WO CONTRAST  Result Date: 09/10/2020 CLINICAL DATA:  Stroke post revascularization and stent placement EXAM: CT HEAD WITHOUT CONTRAST TECHNIQUE: Contiguous axial images were obtained from the base of the skull through the vertex without intravenous contrast. COMPARISON:  Earlier same day FINDINGS: Brain: Parenchymal and subarachnoid hemorrhage  again identified in the right cerebral hemisphere particularly about the sylvian fissure. Extent of hyperdensity has decreased probably reflecting component of contrast staining. This is superimposed on hypoattenuation reflecting evolving infarction with extent better seen on MRI. Small volume hemorrhage is present within the occipital horns. Bilateral subdural hematomas are again identified with small volume hyperdense hemorrhage. Stable partial effacement of the right lateral ventricle and minor leftward midline shift. No hydrocephalus. Vascular: Right MCA branch stent.  No new findings. Skull: Prior left craniotomy and right burr hole. Sinuses/Orbits: No acute finding. Other: None. IMPRESSION: Evolving right MCA territory infarction with hemorrhagic conversion. Extent is better seen on same day MRI. Parenchymal, sulcal subarachnoid, and intraventricular hemorrhage are present. Bilateral subdural hematomas again identified with small volume hyperdense hemorrhage. Stable mild mass effect. Electronically Signed   By: Macy Mis M.D.   On: 09/10/2020 19:12   EEG adult  Result Date: 09/11/2020 Lora Havens, MD     09/11/2020  1:10 PM Patient Name: Deklyn Gibbon MRN: [id number] Epilepsy Attending: Lora Havens Referring Physician/Provider: Tollie Eth, NP Date: 09/11/2020 Duration: 24.37 mins Patient history: 80 year old male with history of seizures, presented with altered mental status. EEG to evaluate for seizures. Level of alertness: Awake,asleep AEDs during EEG study: Keppra Technical aspects: This EEG study was done with scalp electrodes positioned according to the 10-20 International system of electrode placement. Electrical activity was acquired at a sampling rate of 500Hz  and reviewed with a high frequency filter of 70Hz  and  a low frequency filter of 1Hz . EEG data were recorded continuously and digitally stored. Description: No clear posterior dominant rhythm was seen.  Sleep  was characterized by vertex waves, sleep spindles (12 to 14 Hz), maximal frontocentral region.  EEG showed continuous 5 to 8 Hz theta-alpha activity in left hemisphere admixed with 15 to 18 Hz beta activity in left central temporal region consistent with breach artifact.  There is also 3 to 5 Hz theta-delta slowing in right hemisphere. Sharp transients were seen in right> left temporal region.  Hyperventilation and photic stimulation were not performed.   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  There is also evidence of breach artifact in left centro- temporal region consistent with prior craniotomy.  Additionally there is evidence of moderate diffuse encephalopathy, nonspecific etiology.  No seizures or definite epileptiform discharges were seen throughout the recording.  If suspicion for interictal activity remains a concern, a prolonged study can be considered. Lora Havens       Medical Problem List and Plan: 1.  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  -patient may *** shower  -ELOS/Goals: *** 2.  Antithrombotics: -DVT/anticoagulation: Subcutaneous heparin  -antiplatelet therapy: Aspirin 81 mg daily and Brilinta 45 mg BId 3. Pain Management: Tylenol as needed  4. Mood: Prozac 20 mg daily  -antipsychotic agents: N/A 5. Neuropsych: This patient is capable of making decisions on his own behalf. 6. Skin/Wound Care: Routine skin checks 7. Fluids/Electrolytes/Nutrition: Routine in and outs with follow-up chemistries 8.  Dysphagia.  NPO.  Alternative means of nutritional  support. 9.  Seizure prophylaxis.  Keppra 500 mg every 12 hours 10.  Hypertension.  Clonidine patch 0.2 mg every 7 days, Norvasc 10 mg daily.  Monitor with increased mobility 11.  Hyperlipidemia.  Lipitor 12.  Diabetes mellitus.  Hemoglobin A1c 7.8.  Lantus insulin 5 units nightly 13.  GERD.  Protonix ***  Cathlyn Parsons, PA-C 09/12/2020

## 2020-09-12 NOTE — Progress Notes (Signed)
[facility] P[facility]  Increased work of breathing after PT duoneb prn Add lasix x1 dose  Roselie Awkward, MD [place] Pager: [phone] Cell: [phone] If no response, please call [phone] until 7pm After 7:00 pm call Elink  [phone]

## 2020-09-12 NOTE — Progress Notes (Signed)
Physical Therapy Treatment Patient Details Name: Frank [name] MRN: [id number] DOB: 1940/09/03 Today's Date: 09/12/2020    History of Present Illness Patient is a 80 y/o male who presents with dizziness, confusion, right weakness and right facial droop. NIH:4. 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 Repeat Head CT 3/1- increased SAH in right perisylvian fissure and parenchymal hemorrhage in the infarct territory with small volume intraventricular extension. MRI pending. PMH includes TBI, SDH, CKD, OSA, neuropathy, HTN, DM, AV block s/p PPM.    PT Comments    Pt progressing steadily toward goals.  Pt alert and talkative at beginning of session and fatigued at end of session.  Awareness to L side diminished due to significant loss of sensation L upper and LE's. Emphasis on transitions, sitting balance, sit to stand and standing/pre gait activity over 3-5 min trials x2. And transfers to chair.     Follow Up Recommendations  CIR;Supervision for mobility/OOB;Supervision/Assistance - 24 hour     Equipment Recommendations  None recommended by PT;Wheelchair cushion (measurements PT);Wheelchair (measurements PT)    Recommendations for Other Services Rehab consult     Precautions / Restrictions Precautions Precautions: Fall Precaution Comments: watch o2    Mobility  Bed Mobility Overal bed mobility: Needs Assistance Bed Mobility: Supine to Sit     Supine to sit: Max assist;HOB elevated;Mod assist;+2 for physical assistance     General bed mobility comments: Again, pt needed step by step direction for moving LE's to EOB, coming up toware L UE.  Assist up and forward, with assist for unilateral scoot to EOB    Transfers Overall transfer level: Needs assistance Equipment used: 2 person hand held assist (chair back) Transfers: Sit to/from [employer] Sit to Stand: +2  physical assistance;Mod assist;+2 safety/equipment   Squat pivot transfers: Mod assist;+2 physical assistance     General transfer comment: cues for hand placement, hand over hand assist L UE.  2 person assist to come forward and boost symmetrically.  Hand over hand sequencing of squat pivot transfer.  Ambulation/Gait                 Stairs             Wheelchair Mobility    Modified Rankin (Stroke Patients Only) Modified Rankin (Stroke Patients Only) Pre-Morbid Rankin Score: Slight disability Modified Rankin: Moderately severe disability     Balance Overall balance assessment: Needs assistance Sitting-balance support: Bilateral upper extremity supported;Feet supported Sitting balance-Leahy Scale: Poor Sitting balance - Comments: L lateral lean.  Worked at [employer] for minutes working on coming R into midline with upright posture   Standing balance support: Single extremity supported;During functional activity Standing balance-Leahy Scale: Poor Standing balance comment: reliant on external support.  stood at EOB x 2 trials of 3-5 min each working on w/shifting with stress coming R, unweighting, midline orientation                            Cognition Arousal/Alertness: Awake/alert Behavior During Therapy: Impulsive;WFL for tasks assessed/performed Overall Cognitive Status: Difficult to assess Area of Impairment: Following commands;Awareness;Safety/judgement                   Current Attention Level: Sustained   Following Commands: Follows one step commands with increased time Safety/Judgement: Decreased awareness of safety;Decreased awareness of deficits Awareness: Intellectual Problem Solving: Slow processing;Requires verbal cues;Requires  tactile cues General Comments: following commands more consistently, but needing processing time.      Exercises Other Exercises Other Exercises: hip/knee flex/ext ROM warm up exercise with graded  resistance gross extension x 10.    General Comments General comments (skin integrity, edema, etc.): vss, except high BP's systolic in the 284'X on the leg and 150's in right arm.      Pertinent Vitals/Pain Pain Assessment: No/denies pain    Home Living Family/patient expects to be discharged to:: Private residence Living Arrangements: Spouse/significant other;Other (Comment) Available Help at Discharge: Family;Personal care attendant;Other (Comment) Type of Home: House Home Access: Stairs to enter Entrance Stairs-Rails: Can reach both Home Layout: One level Home Equipment: Walker - 2 wheels;Shower seat;Grab bars - tub/shower;Grab bars - toilet;Cane - single point;Walker - 4 wheels;Wheelchair - manual      Prior Function Level of Independence: Independent with assistive device(s)      Comments: Does own ADLs. Has a caregiver coming 5 days/week from 8-4 pm. Just got rid of night time nurse recently. Going to OPPT twice/week for balance. Does not drive. Goes out to lunch with friends. Doing really well per wife.   PT Goals (current goals can now be found in the care plan section) Acute Rehab PT Goals PT Goal Formulation: With patient/family Time For Goal Achievement: 09/24/20 Potential to Achieve Goals: Good    Frequency    Min 4X/week      PT Plan      Co-evaluation              AM-PAC PT "6 Clicks" Mobility   Outcome Measure  Help needed turning from your back to your side while in a flat bed without using bedrails?: A Little Help needed moving from lying on your back to sitting on the side of a flat bed without using bedrails?: A Lot Help needed moving to and from a bed to a chair (including a wheelchair)?: A Lot Help needed standing up from a chair using your arms (e.g., wheelchair or bedside chair)?: A Lot Help needed to walk in hospital room?: A Lot Help needed climbing 3-5 steps with a railing? : Total 6 Click Score: 12    End of Session   Activity  Tolerance: Patient tolerated treatment well;Patient limited by fatigue Patient left: in chair;with call bell/phone within reach;with chair alarm set;Other (comment);with family/visitor present;with nursing/sitter in room (ma) Nurse Communication: Mobility status PT Visit Diagnosis: Hemiplegia and hemiparesis;Difficulty in walking, not elsewhere classified (R26.2);Unsteadiness on feet (R26.81) Hemiplegia - Right/Left: Left Hemiplegia - dominant/non-dominant: Non-dominant Hemiplegia - caused by: Cerebral infarction     Time: 1610-1716 PT Time Calculation (min) (ACUTE ONLY): 66 min  Charges:  $Therapeutic Activity: 23-37 mins $Neuromuscular Re-education: 23-37 mins                     09/12/2020  Ginger Carne., PT Acute Rehabilitation Services [phone]  (pager) [phone]  (office)   Tessie Fass Keiran Gaffey 09/12/2020, 6:03 PM

## 2020-09-13 ENCOUNTER — Inpatient Hospital Stay (HOSPITAL_COMMUNITY): Payer: Medicare Other

## 2020-09-13 DIAGNOSIS — I63512 Cerebral infarction due to unspecified occlusion or stenosis of left middle cerebral artery: Secondary | ICD-10-CM | POA: Diagnosis not present

## 2020-09-13 DIAGNOSIS — I6601 Occlusion and stenosis of right middle cerebral artery: Secondary | ICD-10-CM | POA: Diagnosis not present

## 2020-09-13 LAB — GLUCOSE, CAPILLARY
Glucose-Capillary: 117 mg/dL — ABNORMAL HIGH (ref 70–99)
Glucose-Capillary: 143 mg/dL — ABNORMAL HIGH (ref 70–99)
Glucose-Capillary: 200 mg/dL — ABNORMAL HIGH (ref 70–99)
Glucose-Capillary: 202 mg/dL — ABNORMAL HIGH (ref 70–99)
Glucose-Capillary: 245 mg/dL — ABNORMAL HIGH (ref 70–99)
Glucose-Capillary: 255 mg/dL — ABNORMAL HIGH (ref 70–99)

## 2020-09-13 LAB — CBC
HCT: 31.5 % — ABNORMAL LOW (ref 39.0–52.0)
Hemoglobin: 9.7 g/dL — ABNORMAL LOW (ref 13.0–17.0)
MCH: 26.6 pg (ref 26.0–34.0)
MCHC: 30.8 g/dL (ref 30.0–36.0)
MCV: 86.3 fL (ref 80.0–100.0)
Platelets: 129 10*3/uL — ABNORMAL LOW (ref 150–400)
RBC: 3.65 MIL/uL — ABNORMAL LOW (ref 4.22–5.81)
RDW: 14 % (ref 11.5–15.5)
WBC: 6.4 10*3/uL (ref 4.0–10.5)
nRBC: 0 % (ref 0.0–0.2)

## 2020-09-13 LAB — BASIC METABOLIC PANEL
Anion gap: 10 (ref 5–15)
BUN: 33 mg/dL — ABNORMAL HIGH (ref 8–23)
CO2: 25 mmol/L (ref 22–32)
Calcium: 9 mg/dL (ref 8.9–10.3)
Chloride: 117 mmol/L — ABNORMAL HIGH (ref 98–111)
Creatinine, Ser: 1.46 mg/dL — ABNORMAL HIGH (ref 0.61–1.24)
GFR, Estimated: 48 mL/min — ABNORMAL LOW (ref >60)
Glucose, Bld: 238 mg/dL — ABNORMAL HIGH (ref 70–99)
Potassium: 4.1 mmol/L (ref 3.5–5.1)
Sodium: 152 mmol/L — ABNORMAL HIGH (ref 135–145)

## 2020-09-13 LAB — PHOSPHORUS
Phosphorus: 3.4 mg/dL (ref 2.5–4.6)
Phosphorus: 2.9 mg/dL (ref 2.5–4.6)

## 2020-09-13 LAB — TRIGLYCERIDES: Triglycerides: 77 mg/dL (ref <150)

## 2020-09-13 LAB — MAGNESIUM
Magnesium: 1.8 mg/dL (ref 1.7–2.4)
Magnesium: 1.7 mg/dL (ref 1.7–2.4)

## 2020-09-13 LAB — SODIUM
Sodium: 154 mmol/L — ABNORMAL HIGH (ref 135–145)
Sodium: 155 mmol/L — ABNORMAL HIGH (ref 135–145)
Sodium: 150 mmol/L — ABNORMAL HIGH (ref 135–145)

## 2020-09-13 MED ORDER — AMOXICILLIN-POT CLAVULANATE 250-62.5 MG/5ML PO SUSR
500.0000 mg | Freq: Two times a day (BID) | ORAL | Status: AC
  Administered 2020-09-13 – 2020-09-17 (×8): 500 mg
  Filled 2020-09-13 (×8): qty 10

## 2020-09-13 MED ORDER — RESOURCE THICKENUP CLEAR PO POWD
ORAL | Status: DC | PRN
  Filled 2020-09-13 (×2): qty 125

## 2020-09-13 NOTE — TOC Initial Note (Signed)
Transition of Care [facility]) - Initial/Assessment Note    Patient Details  Name: Frank [name] MRN: [id number] Date of Birth: [birth year]  Transition of Care [facility]) CM/SW Contact:    Geralynn Ochs, LCSW Phone Number: 09/13/2020, 3:50 PM  Clinical Narrative:     CSW alerted by Rehab Admissions that they spoke with the wife and she was interested in looking elsewhere. CSW reached out to wife via phone and discussed with her plans for discharge. Per patient's wife, the patient had been in CIR previously with a brain injury but they didn't make enough progress with him and he had to go to [facility], which she was much happier with and he has followed up there outpatient afterwards. She has reached out to their office to ask about availability and will get back to [place]. Wife says she also has a case Freight forwarder through [employer] that is helping her and also suggested a place in [place], but she couldn't remember the name of it. Wife will get the name of the place in [place] and get back to [place]. CSW to fax referral to [facility] for rehab if they have beds available.               Expected Discharge Plan: IP Rehab Facility Barriers to Discharge: Continued Medical Work up,Insurance Authorization   Patient Goals and CMS Choice Patient states their goals for this hospitalization and ongoing recovery are:: to get rehab CMS Medicare.gov Compare Post Acute Care list provided to:: Patient Represenative (must comment) Choice offered to / list presented to : Spouse  Expected Discharge Plan and Services Expected Discharge Plan: [place]     Post Acute Care Choice: IP Rehab Living arrangements for the past 2 months: Single Family Home                                      Prior Living Arrangements/Services Living arrangements for the past 2 months: Single Family Home Lives with:: Spouse Patient language and need for interpreter reviewed:: No Do you feel safe going  back to the place where you live?: Yes      Need for Family Participation in Patient Care: Yes (Comment) Care giver support system in place?: No (comment)   Criminal Activity/Legal Involvement Pertinent to Current Situation/Hospitalization: No - Comment as needed  Activities of Daily Living Home Assistive Devices/Equipment: None ADL Screening (condition at time of admission) Patient's cognitive ability adequate to safely complete daily activities?: Yes Is the patient deaf or have difficulty hearing?: No Does the patient have difficulty seeing, even when wearing glasses/contacts?: No Does the patient have difficulty concentrating, remembering, or making decisions?: Yes Patient able to express need for assistance with ADLs?: Yes Does the patient have difficulty dressing or bathing?: No Independently performs ADLs?: Yes (appropriate for developmental age) Does the patient have difficulty walking or climbing stairs?: No Weakness of Legs: None Weakness of Arms/Hands: None  Permission Sought/Granted Permission sought to share information with : Facility Retail banker granted to share information with : Yes, Verbal Permission Granted  Share Information with NAME: [name]  Permission granted to share info w AGENCY: IP Rehab  Permission granted to share info w Relationship: Spouse     Emotional Assessment   Attitude/Demeanor/Rapport: Engaged Affect (typically observed): Appropriate Orientation: : Oriented to Self,Oriented to Place,Oriented to  Time,Oriented to Situation Alcohol / Substance Use: Not Applicable Psych Involvement: No (  comment)  Admission diagnosis:  Stroke (cerebrum) ([place]) [I63.9] Middle cerebral artery embolism, right [I66.01] Patient Active Problem List   Diagnosis Date Noted  . Stroke (cerebrum) ([place]) 09/09/2020  . 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 Chronic renal disease, stage 3, moderately decreased glomerular filtration rate (GFR) between 30-59 mL/min/1.73 square meter ([name]) 11/21/2018  . GERD without esophagitis 07/22/2017  . Depression   . Type 2 diabetes mellitus with peripheral neuropathy ([name])   . OAB (overactive bladder) 11/26/2014  . Routine general medical examination at a health care facility 04/06/2014  . Vitamin D deficiency 07/23/2010  . Obstructive sleep apnea 09/13/2008  . Hyperlipidemia with target LDL less than 100 07/27/2007  . BPH (benign prostatic hyperplasia) 06/21/2007  . 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 B12 deficiency anemia 04/15/2006  . Essential hypertension 04/15/2006  . Allergic rhinitis 04/15/2006   PCP:  Janith Lima, MD Pharmacy:   [facility] [address] [place] [address] Phone: [phone] Fax: [phone]  [facility] ([address], [place] [address] Phone: [phone] Fax: [place], [address], [address] [place], [place] [address] Phone: [phone] Fax: [phone]     Social Determinants of Health (SDOH) Interventions    Readmission Risk Interventions No flowsheet data found.

## 2020-09-13 NOTE — Progress Notes (Signed)
Referring Physician(s): Code Stroke   Supervising Physician: Luanne Bras  Patient Status:  [facility] - In-pt  Chief Complaint:  History of acute CVA s/p cerebral arteriogram with emergent mechanical thrombectomy and stent placement of right MCA inferior division M2/M3 occlusion, along with angioplasty of right ICA stenosis, achieving a TICI 3 revascularization via right femoral approach 09/09/2020 by Dr. Estanislado Pandy.  Subjective: Patient resting comfortably in bed; able to move all extremities; left-sided weakness present  Allergies: Lisinopril, Invokana [canagliflozin], Penicillins, and Sulfamethoxazole  Medications: Prior to Admission medications   Medication Sig Start Date End Date Taking? Authorizing Provider  acetaminophen (TYLENOL) 325 MG tablet Take 1-2 tablets (325-650 mg total) by mouth every 4 (four) hours as needed for mild pain. 05/15/20  Yes Shirley Friar, PA-C  amLODipine (NORVASC) 10 MG tablet Take 10 mg by mouth at bedtime.   Yes [provider]  atorvastatin (LIPITOR) 20 MG tablet TAKE ONE-HALF TABLET BY  MOUTH DAILY Patient taking differently: Take 10 mg by mouth daily. 06/06/20  Yes Janith Lima, MD  azelastine (ASTELIN) 0.1 % nasal spray Place 1 spray into both nostrils 2 (two) times daily. Use in each nostril as directed 11/27/19  Yes Janith Lima, MD  Carbonyl Iron 25 MG TABS Take 25 mg by mouth daily with breakfast.    Yes [provider]  doxazosin (CARDURA) 4 MG tablet TAKE ONE-HALF TABLET BY  MOUTH AT BEDTIME Patient taking differently: Take 2 mg by mouth at bedtime. 06/06/20  Yes Janith Lima, MD  FLUoxetine (PROZAC) 20 MG capsule Take 1 capsule (20 mg total) by mouth daily. 04/10/20  Yes Janith Lima, MD  glipiZIDE (GLUCOTROL XL) 10 MG 24 hr tablet Take 1 tablet (10 mg total) by mouth daily. 04/10/20  Yes Janith Lima, MD  Glucagon (GVOKE HYPOPEN 2-PACK) 1 MG/0.2ML SOAJ Inject 1 Act into the skin daily as  needed. Patient taking differently: Inject 1 each into the skin as needed (low blood sugar). 04/30/20  Yes Janith Lima, MD  insulin glargine, 2 Unit Dial, (TOUJEO MAX SOLOSTAR) 300 UNIT/ML Solostar Pen Inject 10 Units into the skin daily. 04/16/20  Yes Janith Lima, MD  Insulin Pen Needle 32G X 6 MM MISC 1 Act by Does not apply route daily. 04/08/20  Yes Janith Lima, MD  levocetirizine (XYZAL) 5 MG tablet Take 1 tablet (5 mg total) by mouth every evening. 11/27/19  Yes Janith Lima, MD  mupirocin ointment (BACTROBAN) 2 % Apply 1 application topically 2 (two) times daily. 05/31/20  Yes Marrian Salvage, FNP  pantoprazole (PROTONIX) 40 MG tablet TAKE 1 TABLET BY MOUTH  DAILY Patient taking differently: Take 40 mg by mouth daily. 04/30/20  Yes Janith Lima, MD  PRESCRIPTION MEDICATION CPAP- At bedtime   Yes [provider]  psyllium (METAMUCIL) 58.6 % powder Take 1 packet by mouth daily as needed (for constipation- MIX AND DRINK).    Yes [provider]  solifenacin (VESICARE) 10 MG tablet TAKE 1 TABLET BY MOUTH  DAILY Patient taking differently: Take 10 mg by mouth daily. 08/09/20  Yes Janith Lima, MD  telmisartan (MICARDIS) 20 MG tablet TAKE 1 TABLET BY MOUTH  DAILY Patient taking differently: Take 20 mg by mouth daily. 09/07/20  Yes Janith Lima, MD  triamcinolone cream (KENALOG) 0.1 % Apply 1 application topically daily as needed (to affected areas).  07/26/19  Yes [provider]     Vital Signs: BP [name])  153/99   Pulse 88   Temp 98.3 F (36.8 C) (Axillary)   Resp (!) 24   Ht 5\' 9"  (1.753 m)   Wt 218 lb 4.1 oz (99 kg)   SpO2 96%   BMI 32.23 kg/m   Physical Exam Vitals and nursing note reviewed.  Constitutional:      General: He is not in acute distress. Pulmonary:     Effort: Pulmonary effort is normal. No respiratory distress.  Skin:    General: Skin is warm and dry.     Comments: Right femoral puncture site soft without active  bleeding or hematoma.  Neurological:     Comments: Follows simple commands. PERRL bilaterally. Can spontaneously move all extremities with weakness of left side. Distal pulses (DPs) 1+ bilaterally.    Imaging: CT Head Wo Contrast  Result Date: 09/12/2020 CLINICAL DATA:  Change in mental status.  Follow-up EXAM: CT HEAD WITHOUT CONTRAST TECHNIQUE: Contiguous axial images were obtained from the base of the skull through the vertex without intravenous contrast. COMPARISON:  Two days ago FINDINGS: Brain: Decreased subarachnoid and parenchymal hemorrhage on the right. No progressive swelling at the large right MCA branch infarct. No hydrocephalus. Small bilateral subdural hematoma with scattered high-density areas likely from septation. Vascular: No hyperdense vessel or unexpected calcification. Skull: Postoperative calvarium. Sinuses/Orbits: Negative IMPRESSION: 1. Large acute right MCA branch infarct without visible progression. Related hemorrhage is decreasing. 2. Stable subdural hematomas without significant mass effect. Electronically Signed   By: Monte Fantasia M.D.   On: 09/12/2020 09:56   CT HEAD WO CONTRAST  Result Date: 09/10/2020 CLINICAL DATA:  Stroke post revascularization and stent placement EXAM: CT HEAD WITHOUT CONTRAST TECHNIQUE: Contiguous axial images were obtained from the base of the skull through the vertex without intravenous contrast. COMPARISON:  Earlier same day FINDINGS: Brain: Parenchymal and subarachnoid hemorrhage again identified in the right cerebral hemisphere particularly about the sylvian fissure. Extent of hyperdensity has decreased probably reflecting component of contrast staining. This is superimposed on hypoattenuation reflecting evolving infarction with extent better seen on MRI. Small volume hemorrhage is present within the occipital horns. Bilateral subdural hematomas are again identified with small volume hyperdense hemorrhage. Stable partial effacement of the  right lateral ventricle and minor leftward midline shift. No hydrocephalus. Vascular: Right MCA branch stent.  No new findings. Skull: Prior left craniotomy and right burr hole. Sinuses/Orbits: No acute finding. Other: None. IMPRESSION: Evolving right MCA territory infarction with hemorrhagic conversion. Extent is better seen on same day MRI. Parenchymal, sulcal subarachnoid, and intraventricular hemorrhage are present. Bilateral subdural hematomas again identified with small volume hyperdense hemorrhage. Stable mild mass effect. Electronically Signed   By: Macy Mis M.D.   On: 09/10/2020 19:12   CT HEAD WO CONTRAST  Result Date: 09/10/2020 CLINICAL DATA:  Acute stroke suspected EXAM: CT HEAD WITHOUT CONTRAST TECHNIQUE: Contiguous axial images were obtained from the base of the skull through the vertex without intravenous contrast. COMPARISON:  Flat panel CT 09/09/2020 FINDINGS: Brain: Subarachnoid hemorrhage/contrast has increased/diffused along the right sylvian fissure and adjacent sulci. Visible cytotoxic edema in the right perisylvian cortex. There is a degree of parenchymal hemorrhage at the posterior caudate and deep white matter on the right, reaching the ependymoma with small volume intraventricular extension at the dependent lateral ventricles. No hydrocephalus. Bilateral chronic subdural hematoma with similar degree of wispy and nodular high-density posteriorly. Vascular: Right MCA branch stent. Skull: Prior burr holes on left-sided craniotomy. Sinuses/Orbits: No acute finding.  Bilateral cataract resection. Other:  A call has been placed to the ordering provider. IMPRESSION: 1. Increased subarachnoid hemorrhage/contrast around the right sylvian fissure. There is also some interval parenchymal hemorrhage in the infarct territory with small volume intraventricular extension. No hydrocephalus or midline shift. 2. Right perisylvian cortical infarct correlating with affected vessel and the perfusion  maps. 3. Chronic subdural hematomas. Electronically Signed   By: Monte Fantasia M.D.   On: 09/10/2020 06:01   MR ANGIO HEAD WO CONTRAST  Result Date: 09/10/2020 CLINICAL DATA:  Acute onset of difficulty standing and walking. Right facial droop. Status post revascularization of right inferior M2 M3 occlusion. Status post balloon angioplasty of right internal carotid artery. Placement intracranial stent. EXAM: MRI HEAD WITHOUT CONTRAST TECHNIQUE: Multiplanar, multiecho pulse sequences of the brain and surrounding structures were obtained without intravenous contrast. COMPARISON:  CT head without contrast, CT perfusion head and CTA head 09/09/2020. CT head without contrast 09/10/2020. FINDINGS: Brain: The diffusion-weighted images demonstrate an acute/subacute infarct similar in size to the predicted area of ischemia on the CT perfusion study. Parenchymal hemorrhage is noted throughout the bulk of the infarct. The parenchymal hemorrhage measures over 4 cm. Element of subarachnoid hemorrhage is present as well. Diffuse cortical edema is present within the infarct territory. There is some mass effect with out significant midline shift. No acute left-sided infarct is present. Mild atrophy is present. Chronic bilateral subdural hematomas are again noted. The internal auditory canals are within normal limits. The brainstem and cerebellum are within normal limits. Vascular: Flow is present in the major intracranial arteries. Artifact from intracranial stent within inferior left M2 segment noted. Skull and upper cervical spine: Craniocervical junction is within normal limits. Slight anterolisthesis is present at C2-3. Marrow signal is normal. Sinuses/Orbits: Mild mucosal thickening is scattered throughout the ethmoid air cells and frontal sinuses bilaterally. No significant mastoid effusion is present. No fluid levels are present. Bilateral lens replacements are noted. Globes and orbits are otherwise unremarkable.  IMPRESSION: 1. Large acute/subacute left-sided infarct with parenchymal hemorrhage measuring over 4 cm. PH2 hemorrhage. 2. Element of acute right subarachnoid hemorrhage. 3. Chronic bilateral subdural hematomas. 4. No significant midline shift. Electronically Signed   By: San Morelle M.D.   On: 09/10/2020 12:38   MR BRAIN WO CONTRAST  Result Date: 09/10/2020 CLINICAL DATA:  Acute onset of difficulty standing and walking. Right facial droop. Status post revascularization of right inferior M2 M3 occlusion. Status post balloon angioplasty of right internal carotid artery. Placement intracranial stent. EXAM: MRI HEAD WITHOUT CONTRAST TECHNIQUE: Multiplanar, multiecho pulse sequences of the brain and surrounding structures were obtained without intravenous contrast. COMPARISON:  CT head without contrast, CT perfusion head and CTA head 09/09/2020. CT head without contrast 09/10/2020. FINDINGS: Brain: The diffusion-weighted images demonstrate an acute/subacute infarct similar in size to the predicted area of ischemia on the CT perfusion study. Parenchymal hemorrhage is noted throughout the bulk of the infarct. The parenchymal hemorrhage measures over 4 cm. Element of subarachnoid hemorrhage is present as well. Diffuse cortical edema is present within the infarct territory. There is some mass effect with out significant midline shift. No acute left-sided infarct is present. Mild atrophy is present. Chronic bilateral subdural hematomas are again noted. The internal auditory canals are within normal limits. The brainstem and cerebellum are within normal limits. Vascular: Flow is present in the major intracranial arteries. Artifact from intracranial stent within inferior left M2 segment noted. Skull and upper cervical spine: Craniocervical junction is within normal limits. Slight anterolisthesis is present at C2-3. Marrow  signal is normal. Sinuses/Orbits: Mild mucosal thickening is scattered throughout the ethmoid  air cells and frontal sinuses bilaterally. No significant mastoid effusion is present. No fluid levels are present. Bilateral lens replacements are noted. Globes and orbits are otherwise unremarkable. IMPRESSION: 1. Large acute/subacute left-sided infarct with parenchymal hemorrhage measuring over 4 cm. PH2 hemorrhage. 2. Element of acute right subarachnoid hemorrhage. 3. Chronic bilateral subdural hematomas. 4. No significant midline shift. Electronically Signed   By: San Morelle M.D.   On: 09/10/2020 12:38   DG CHEST PORT 1 VIEW  Result Date: 09/12/2020 CLINICAL DATA:  Respiratory failure.  Hypoxia. EXAM: PORTABLE CHEST 1 VIEW COMPARISON:  Chest x-ray 05/15/2020. FINDINGS: Feeding tube noted with tip below left hemidiaphragm. Cardiac pacer noted in stable position. Mitral annular calcification. Borderline cardiomegaly and pulmonary venous congestion. Low lung volumes with mild bibasilar atelectasis. Mild right base infiltrate cannot be excluded. Tiny bilateral pleural effusions cannot be excluded. No pneumothorax. Degenerative changes thoracic spine. Postsurgical changes both shoulders. IMPRESSION: 1.  Feeding tube noted with tip below left hemidiaphragm. 2. Cardiac pacer stable position. Borderline cardiomegaly pulmonary venous congestion. 3. Mild bibasilar atelectasis. Mild right base infiltrate cannot be excluded. Tiny bilateral pleural effusions cannot be excluded. Electronically Signed   By: Marcello Moores  Register   On: 09/12/2020 16:34   DG Abd Portable 1V  Result Date: 09/09/2020 CLINICAL DATA:  Nasogastric tube placement EXAM: PORTABLE ABDOMEN - 1 VIEW COMPARISON:  None. FINDINGS: No nasogastric tube is identified within the visualized abdomen. The right flank and pelvis are excluded from view. The visualized abdominal gas pattern is unremarkable. IMPRESSION: Nasogastric tube not identified within the visualized distal esophagus and abdomen. Chest radiograph may be helpful to identify the  location of a malpositioned catheter. These results will be called to the ordering clinician or representative by the Radiologist Assistant, and communication documented in the PACS or [employer]. Electronically Signed   By: Fidela Salisbury MD   On: 09/09/2020 23:49   EEG adult  Result Date: 09/11/2020 Lora Havens, MD     09/11/2020  1:10 PM Patient Name: Frank [name] MRN: [id number] Epilepsy Attending: Lora Havens Referring Physician/Provider: Tollie Eth, NP Date: 09/11/2020 Duration: 24.37 mins Patient history: 80 year old male with history of seizures, presented with altered mental status. EEG to evaluate for seizures. Level of alertness: Awake,asleep AEDs during EEG study: Keppra Technical aspects: This EEG study was done with scalp electrodes positioned according to the 10-20 International system of electrode placement. Electrical activity was acquired at a sampling rate of 500Hz  and reviewed with a high frequency filter of 70Hz  and a low frequency filter of 1Hz . EEG data were recorded continuously and digitally stored. Description: No clear posterior dominant rhythm was seen.  Sleep was characterized by vertex waves, sleep spindles (12 to 14 Hz), maximal frontocentral region.  EEG showed continuous 5 to 8 Hz theta-alpha activity in left hemisphere admixed with 15 to 18 Hz beta activity in left central temporal region consistent with breach artifact.  There is also 3 to 5 Hz theta-delta slowing in right hemisphere. Sharp transients were seen in right> left temporal region.  Hyperventilation and photic stimulation were not performed.   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  There is also evidence of breach artifact in left centro- temporal region consistent with prior  craniotomy.  Additionally there is evidence of  moderate diffuse encephalopathy, nonspecific etiology.  No seizures or definite epileptiform discharges were seen throughout the recording.  If suspicion for interictal activity remains a concern, a prolonged study can be considered. Lora Havens   ECHOCARDIOGRAM COMPLETE  Result Date: 09/09/2020    ECHOCARDIOGRAM REPORT   Patient Name:   Frank [name] [facility] Date of Exam: 09/09/2020 Medical Rec #:  [id number]             Height:       69.0 in Accession #:    [id number]            Weight:       218.3 lb Date of Birth:  [birth year]             BSA:          2.144 m Patient Age:    33 years              BP:           100/45 mmHg Patient Gender: M                     HR:           70 bpm. Exam Location:  Inpatient Procedure: 2D Echo, Cardiac Doppler, Color Doppler and Intracardiac            Opacification Agent Indications:    Stroke  History:        Patient has prior history of Echocardiogram examinations, most                 recent 06/24/2017. Risk Factors:Sleep Apnea, Diabetes and Former                 Smoker. GERD.  Sonographer:    Clayton Lefort RDCS (AE) Referring Phys: [phone] [facility]  Sonographer Comments: Technically difficult study due to poor echo windows. Image acquisition challenging due to respiratory motion. IMPRESSIONS  1. Left ventricular ejection fraction, by estimation, is 65 to 70%. The left ventricle has normal function. The left ventricle has no regional wall motion abnormalities. There is severe concentric left ventricular hypertrophy. Left ventricular diastolic  parameters are consistent with Grade I diastolic dysfunction (impaired relaxation). Elevated left atrial pressure.  2. Right ventricular systolic function is normal. The right ventricular size is mildly enlarged. There is normal pulmonary artery systolic pressure.  3. The mitral valve is normal in structure. No evidence of mitral valve regurgitation. Mild mitral stenosis. The  mean mitral valve gradient is 3.0 mmHg. Moderate mitral annular calcification.  4. The aortic valve is normal in structure. There is severe calcifcation of the aortic valve. There is severe thickening of the aortic valve. Aortic valve regurgitation is not visualized. Mild aortic valve stenosis. Aortic valve mean gradient measures 9.0 mmHg.  5. The inferior vena cava is normal in size with greater than 50% respiratory variability, suggesting right atrial pressure of 3 mmHg. FINDINGS  Left Ventricle: Left ventricular ejection fraction, by estimation, is 65 to 70%. The left ventricle has normal function. The left ventricle has no regional wall motion abnormalities. Definity contrast agent was given IV to delineate the left ventricular  endocardial borders. The left ventricular internal cavity size was normal in size. There is severe concentric left ventricular hypertrophy. Left ventricular diastolic parameters are consistent with Grade I diastolic dysfunction (impaired relaxation). Elevated left atrial pressure. Right Ventricle: The right ventricular size is mildly enlarged. No increase in right ventricular wall thickness. Right ventricular  systolic function is normal. There is normal pulmonary artery systolic pressure. Left Atrium: Left atrial size was normal in size. Right Atrium: Right atrial size was normal in size. Pericardium: There is no evidence of pericardial effusion. Mitral Valve: The mitral valve is normal in structure. There is moderate thickening of the mitral valve leaflet(s). There is moderate calcification of the mitral valve leaflet(s). Moderate mitral annular calcification. No evidence of mitral valve regurgitation. Mild mitral valve stenosis. MV peak gradient, 10.9 mmHg. The mean mitral valve gradient is 3.0 mmHg. Tricuspid Valve: The tricuspid valve is normal in structure. Tricuspid valve regurgitation is mild . No evidence of tricuspid stenosis. Aortic Valve: The aortic valve is normal in  structure. There is severe calcifcation of the aortic valve. There is severe thickening of the aortic valve. Aortic valve regurgitation is not visualized. Mild aortic stenosis is present. Aortic valve mean gradient measures 9.0 mmHg. Aortic valve peak gradient measures 15.7 mmHg. Aortic valve area, by VTI measures 1.81 cm. Pulmonic Valve: The pulmonic valve was normal in structure. Pulmonic valve regurgitation is not visualized. No evidence of pulmonic stenosis. Aorta: The aortic root is normal in size and structure. Venous: The inferior vena cava is normal in size with greater than 50% respiratory variability, suggesting right atrial pressure of 3 mmHg. IAS/Shunts: No atrial level shunt detected by color flow Doppler.  LEFT VENTRICLE PLAX 2D LVIDd:         4.70 cm  Diastology LVIDs:         3.30 cm  LV e' medial:    6.42 cm/s LV PW:         1.80 cm  LV E/e' medial:  15.5 LV IVS:        1.60 cm  LV e' lateral:   6.64 cm/s LVOT diam:     2.00 cm  LV E/e' lateral: 15.0 LV SV:         71 LV SV Index:   33 LVOT Area:     3.14 cm  RIGHT VENTRICLE             IVC RV Basal diam:  3.50 cm     IVC diam: 1.60 cm RV Mid diam:    3.90 cm RV S prime:     11.70 cm/s TAPSE (M-mode): 2.4 cm LEFT ATRIUM             Index       RIGHT ATRIUM           Index LA diam:        3.60 cm 1.68 cm/m  RA Area:     17.70 cm LA Vol (A2C):   59.5 ml 27.75 ml/m RA Volume:   43.70 ml  20.38 ml/m LA Vol (A4C):   53.2 ml 24.81 ml/m LA Biplane Vol: 61.9 ml 28.87 ml/m  AORTIC VALVE AV Area (Vmax):    1.69 cm AV Area (Vmean):   1.83 cm AV Area (VTI):     1.81 cm AV Vmax:           198.33 cm/s AV Vmean:          137.000 cm/s AV VTI:            0.390 m AV Peak Grad:      15.7 mmHg AV Mean Grad:      9.0 mmHg LVOT Vmax:         107.00 cm/s LVOT Vmean:        79.800 cm/s LVOT VTI:  0.225 m LVOT/AV VTI ratio: 0.58  AORTA Ao Root diam: 3.20 cm Ao Asc diam:  3.20 cm MITRAL VALVE MV Area (PHT): 1.89 cm     SHUNTS MV Area VTI:   1.89 cm      Systemic VTI:  0.22 m MV Peak grad:  10.9 mmHg    Systemic Diam: 2.00 cm MV Mean grad:  3.0 mmHg MV Vmax:       1.65 m/s MV Vmean:      78.7 cm/s MV Decel Time: 401 msec MV E velocity: 99.50 cm/s MV A velocity: 175.00 cm/s MV E/A ratio:  0.57 Ena Dawley MD Electronically signed by Ena Dawley MD Signature Date/Time: 09/09/2020/10:39:50 PM    Final     Labs:  CBC: Recent Labs    09/10/20 0606 09/11/20 0440 09/12/20 0514 09/13/20 0625  WBC 8.2 7.6 6.6 6.4  HGB 9.6* 9.0* 9.1* 9.7*  HCT 28.9* 27.7* 29.9* 31.5*  PLT 143* 123* 113* 129*    COAGS: Recent Labs    09/09/20 1101  INR 1.0  APTT 32    BMP: Recent Labs    12/08/19 2250 12/12/19 1450 09/10/20 0606 09/11/20 0440 09/11/20 1112 09/12/20 0514 09/12/20 0946 09/12/20 1725 09/12/20 2219 09/13/20 0625 09/13/20 1026  NA 138   < > 136 137   < > 145   < > 147* 152* 152* 154*  K 4.6   < > 4.6 4.5  --  4.1  --   --   --  4.1  --   CL 102   < > 106 108  --  116*  --   --   --  117*  --   CO2 25   < > 23 18*  --  20*  --   --   --  25  --   GLUCOSE 136*   < > 182* 201*  --  228*  --   --   --  238*  --   BUN 41*   < > 34* 34*  --  29*  --   --   --  33*  --   CALCIUM 8.9   < > 8.2* 8.2*  --  8.2*  --   --   --  9.0  --   CREATININE 2.30*   < > 1.98* 1.85*  --  1.47*  --   --   --  1.46*  --   GFRNONAA 26*   < > 33* 36*  --  48*  --   --   --  48*  --   GFRAA 30*  --   --   --   --   --   --   --   --   --   --    < > = values in this interval not displayed.    LIVER FUNCTION TESTS: Recent Labs    08/21/20 0945 09/09/20 1101  BILITOT 0.4 0.7  AST 15 22  ALT 18 22  ALKPHOS 73 69  PROT 6.6 6.4*  ALBUMIN 3.9 3.6    Assessment and Plan:  History of acute CVA s/p cerebral arteriogram with emergent mechanical thrombectomy and stent placement of right MCA inferior division M2/M3 occlusion, along with angioplasty of right ICA stenosis, achieving a TICI 3 revascularization via right femoral approach 09/09/2020 by  Dr. Estanislado Pandy.  Continue taking Brilinta 45 mg twice daily and Aspirin 81 mg once daily.  Plan to follow-up with Dr. Estanislado Pandy in clinic  1 month after discharge ([facility] schedulers to call patient to set up this appointment).  Further plans per neurology- appreciate and agree with management.  Please call IR with any questions.   Electronically Signed: Soyla Dryer, AGACNP-BC [phone] 09/13/2020, 3:57 PM   I spent a total of 15 Minutes at the the patient's bedside AND on the patient's hospital floor or unit, greater than 50% of which was counseling/coordinating care for CVA s/p revascularization

## 2020-09-13 NOTE — Progress Notes (Signed)
Inpatient Rehab Admissions Coordinator:  Saw pt and wife at bedside.  Informed them that we are still waiting to receive insurance authorization. Pt's wife told me that they are not coming to [facility].  They plan to go to another rehab facility where pt can stay for 100 days and receive therapy. TOC made aware.  Will sign off.     Gayland Curry, [place], [place] Admissions Coordinator [phone]

## 2020-09-13 NOTE — Progress Notes (Signed)
STROKE TEAM PROGRESS NOTE   INTERVAL HISTORY No acute events overnight.  His wife is at the bedside.  Continues to be alert today. Discussed stroke diagnosis and plan of care including weaning hypertonic saline and possible transfer out tomorrow if he remains stable. Questions answered.  Serum sodium is 152 on hypertonic saline.  Neurological exam stable with mild left hemiparesis.  Vitals:   09/13/20 0400 09/13/20 0500 09/13/20 0600 09/13/20 0700  BP: (!) 159/125 (!) 148/91 (!) 158/75 (!) 179/127  Pulse: 83 87 75 96  Resp: 19 (!) 24 18 (!) 23  Temp: 98.6 F (37 C)     TempSrc: Oral     SpO2: 98% 96% 97% 98%  Weight:      Height:       CBC:  Recent Labs  Lab 09/09/20 1101 09/09/20 1103 09/10/20 0606 09/11/20 0440 09/12/20 0514  WBC 6.2  --  8.2 7.6 6.6  NEUTROABS 3.9  --  6.7  --   --   HGB 11.2*   < > 9.6* 9.0* 9.1*  HCT 34.8*   < > 28.9* 27.7* 29.9*  MCV 84.1  --  82.6 85.2 86.2  PLT 124*  --  143* 123* 113*   < > = values in this interval not displayed.   Basic Metabolic Panel:  Recent Labs  Lab 09/12/20 0514 09/12/20 0946 09/12/20 1725 09/12/20 2219 09/13/20 0625  NA 145   < > 147* 152* 152*  K 4.1  --   --   --  4.1  CL 116*  --   --   --  117*  CO2 20*  --   --   --  25  GLUCOSE 228*  --   --   --  238*  BUN 29*  --   --   --  33*  CREATININE 1.47*  --   --   --  1.46*  CALCIUM 8.2*  --   --   --  9.0  MG 1.7  --  1.8  --  1.8  PHOS 3.3  --  3.4  --  3.4   < > = values in this interval not displayed.   Lipid Panel:  Recent Labs  Lab 09/10/20 0606  CHOL 107  TRIG 210*  HDL 36*  CHOLHDL 3.0  VLDL 42*  LDLCALC 29   HgbA1c:  Recent Labs  Lab 09/10/20 0606  HGBA1C 7.8*   Urine Drug Screen: pending  Result Date 09/10/2020 Mri brain/ MRA head 1. Large acute/subacute left-sided infarct with parenchymal hemorrhage measuring over 4 cm. PH2 hemorrhage. 2. Element of acute right subarachnoid hemorrhage. 3. Chronic bilateral subdural  hematomas. 4. No significant midline shift. IMAGING past 24 hours CT HEAD WO CONTRAST  Result Date: 09/10/2020 IMPRESSION:  1. Increased subarachnoid hemorrhage/contrast around the right sylvian fissure. There is also some interval parenchymal hemorrhage in the infarct territory with small volume intraventricular extension. No hydrocephalus or midline shift.  2. Right perisylvian cortical infarct correlating with affected vessel and the perfusion maps.  3. Chronic subdural hematomas.   DG Abd Portable 1V  Result Date: 09/09/2020 Nasogastric tube not identified within the visualized distal esophagus and abdomen. Chest radiograph may be helpful to identify the location of a malpositioned catheter.   Result Date: 09/09/2020 ECHOCARDIOGRAM REPORT   1. Left ventricular ejection fraction, by estimation, is 65 to 70%. The left ventricle has normal function. The left ventricle has no regional wall motion abnormalities. There is severe concentric left  ventricular hypertrophy. Left ventricular diastolic  parameters are consistent with Grade I diastolic dysfunction (impaired relaxation). Elevated left atrial pressure.   2. Right ventricular systolic function is normal. The right ventricular size is mildly enlarged. There is normal pulmonary artery systolic pressure.   3. The mitral valve is normal in structure. No evidence of mitral valve regurgitation. Mild mitral stenosis. The mean mitral valve gradient is 3.0 mmHg. Moderate mitral annular calcification.   4. The aortic valve is normal in structure. There is severe calcifcation of the aortic valve. There is severe thickening of the aortic valve. Aortic valve regurgitation is not visualized. Mild aortic valve stenosis. Aortic valve mean gradient measures 9.0 mmHg.   5. The inferior vena cava is normal in size with greater than 50% respiratory variability, suggesting right atrial pressure of 3 mmHg.   CT ANGIO Head/neck CT perfusion CODE STROKE Result  Date: 09/09/2020  IMPRESSION:  1. Positive for emergent occlusion of a posterior Right MCA distal M2 branch at its bifurcation. Embolized calcification there is new since last year (series 12, image 14).  2. CT Perfusion detects a small infarct core of 13 mL with regional penumbra of 41 mL (28 mL mismatch).  3. Superimposed very bulky calcified plaque at the Right ICA origin with tandem HIGH-GRADE STENOSIS, including Radiographic String Sign (series 7, image 216).  4. 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  5. Bulky Left Carotid bifurcation and proximal left ICA calcified plaque without significant stenosis.  6. Up to moderate Right Vertebral Artery origin stenosis.    PHYSICAL EXAM Obese elderly Caucasian male not in distress. . Afebrile. Head is nontraumatic. Neck is supple without bruit.    Cardiac exam no murmur or gallop. Lungs are clear to auscultation. Distal pulses are well felt. Neurological Exam : Patient is awake and interactive today.  Follows simple midline and one-step commands.  He has significant dysarthria and has right gaze preference but able to look to the left of midline.  He blinks to threat on the right but not on the left.  Mild left lower facial weakness.  Tongue midline.  Motor system exam shows left hemiparesis with 2/5 strength and able to lift arm and leg off the bed unable to sustain the response. Sensation is diminished in the left compared to the right with mild left hemineglect   ASSESSMENT/PLAN Mr. Quandarius Nill is a 80 y.o. 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  Was found to have a right MCA M2 branch occlusion.  .   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  ASPECTS is 10.    CTA head cclusion of a posterior Right MCA distal M2 branch at its bifurcation.  CTA neck right ICA origin with tandem HIGH-GRADE STENOSIS  CT perfusion small infarct core of 13 mL with regional penumbra of 41 mL (28 mL mismatch).   MRI/MRA h: Large acute/subacute left-sided infarct with parenchymal       hemorrhage measuring over 4 cm. PH2 hemorrhage.  Element of acute right subarachnoid hemorrhage.  Chronic bilateral  subdural hematomas.  2D Echo EF 65-70%, severe LVH, Grade I diastolic dysfunction  LDL 29  HgbA1c 7.8  VTE prophylaxis - SCDs, Heparin 5000units q 8hr NPO: speech consulted  No antithrombotic prior to admission, now on aspirin 81 mg daily and ticagrelor 45mg  daily (reduced secondary to [facility])  Therapy recommendations:  PT: CIR  Disposition:  [facility] pending approval/acceptance/stabilty   Follow-up with Dr. Estanislado Pandy in clinic 1 month after discharge  Subarachnoid Hemorrhage  S/p Thrombectomy, stent placement distal M2, balloon angioplasty  CT head: Increased subarachnoid hemorrhage the right sylvian fissure. parenchymal hemorrhage in the infarct territory with small volume intraventricular extension.   SBP goal changed to <160  Normal saline 144ml/hr  Cerebral edema  3% sodium chloride at 54ml/hr, will begin weaning protocol  Follow sodium q 6 hrs  Sodium goal 150-155  Dysphagia  Likely due to stroke  Failed swallow eval  Cortrak placed   Seizure history  Previously on Keppra due to seizures but eventually weaned off   Keppra decreased 3/2 to 500mg  bid for prophylaxis due to somnolence  EEG (3/2): suggestive of cortical dysfunction in right hemisphere likely secondary to underlying infarct.  There is also evidence of breach artifact in  left centro- temporal region consistent with prior craniotomy.  Additionally there is evidence of moderate diffuse encephalopathy, nonspecific etiology.  No seizures or definite epileptiform discharges were seen throughout the recording.   Acute respiratory failure with hypoxia, improving  Aspiration event -short course of cefepime for aspiration if no fever, worsening leukocytosis. Start date 3/3 -aspiration precautions  -SLP  -CCM to sign off today  Hypertension  Home meds:  Amlodipine 10mg  daily, Cadura 2mg  at bedtime, Micardis 20mg  daily  Cleviprex weaned off, on Amlodipine and Cardura   Clonodine patch added 3/2  SBP goal <160 in the setting of SAH, ICH . hydralazine 20mg  q6 hours, labetolol 20 q2 prn . Long-term BP goal normotensive  Hyperlipidemia  Home meds: atorvastatin 20 daily  LDL 29, goal < 70  Started on atorvastatin daily as he is daily   Continue statin at discharge  Diabetes type II Uncontrolled  Home meds:  Glipizide 10mg  daily, glargine 10units daily  HgbA1c 7.8, goal < 7.0  CBGs Recent Labs    09/12/20 1943 09/12/20 2348 09/13/20 0351  GLUCAP 229* 152* 200*      SSI  lantus 5 units qHS, Novolog 2units q 4 hours   Appreciate diabetes coordinator recommendations  Chronic Kidney Injury III  Likely due to contrast load for imaging/IR and dehydration  Creatinine level 1.98 -> 1.85->1.47->1.46  Adequate uop  MIVF stopped   Follow uop closely  Other Stroke Risk Factors  Advanced Age >/= 4   Obesity, Body mass index is 32.23 kg/m., BMI >/= 30 associated with increased stroke risk, recommend weight loss, diet and exercise as appropriate   Coronary artery disease AV block, Mobitz 2, pacer  Obstructive sleep apnea, unclear if he is on CPAP at home  Other Active Problems  Anxiety/depression: prozac  GERD: protonix  Anemia: iron deficient and B12 deficient  Continue mobilize out of bed. PT/OT and rehab.Wean hypertonic drip  over next 24 hrs and Dc.D/w patient and wife and answered questions. This patient is critically ill and at significant risk of neurological worsening, death and care requires constant monitoring of vital signs, hemodynamics,respiratory and cardiac monitoring, extensive review of multiple databases, frequent neurological assessment, discussion with family, other specialists and medical decision making of high complexity.I have made any additions or clarifications  directly to the above note.This critical care time does not reflect procedure time, or teaching time or supervisory time of PA/NP/Med Resident etc but could involve care discussion time.  I spent 30 minutes of neurocritical care time  in the care of  this patient.       Antony Contras, MD   To contact Stroke Continuity provider, please refer to [email or web address] After hours, contact General Neurology

## 2020-09-13 NOTE — Progress Notes (Signed)
Inpatient Diabetes Program Recommendations  AACE/ADA: New Consensus Statement on Inpatient Glycemic Control (2015)  Target Ranges:  Prepandial:   less than 140 mg/dL      Peak postprandial:   less than 180 mg/dL (1-2 hours)      Critically ill patients:  140 - 180 mg/dL   Lab Results  Component Value Date   GLUCAP 245 (H) 09/13/2020   HGBA1C 7.8 (H) 09/10/2020    Review of Glycemic Control Results for Frank [name], Frank [name]" (MRN [id number]) as of 09/13/2020 11:20  Ref. Range 09/12/2020 15:56 09/12/2020 19:43 09/12/2020 23:48 09/13/2020 03:51 09/13/2020 08:08  Glucose-Capillary Latest Ref Range: 70 - 99 mg/dL 253 (H) 229 (H) 152 (H) 200 (H) 245 (H)   Diabetes history: DM 2 Outpatient Diabetes medications:  Glucotrol XL 10 mg daily, Toujeo 10 units daily Current orders for Inpatient glycemic control:  Novolog moderate q 4 hours Novolog 2 units q 4 hours Lantus 5 units q HS Osmolite 55 ml/hr Inpatient Diabetes Program Recommendations:    Consider increasing Lantus to 10 units q HS.   Thanks  Adah Perl, RN, BC-ADM Inpatient Diabetes Coordinator Pager [phone] (8a-5p)

## 2020-09-13 NOTE — Progress Notes (Addendum)
NAME:  Frank [name], MRN:  [id number], DOB:  1941/04/13, LOS: 4 ADMISSION DATE:  09/09/2020, CONSULTATION DATE:  3/3 REFERRING MD:  Leonie Man CHIEF COMPLAINT: Cough/dyspnea  Brief History:  80 year old male with a complex medical history brought from rehab with an MCA stroke.  Pulmonary and critical care medicine consulted in the setting of increased work of breathing felt to be related to aspiration pneumonitis.  CODE STATUS is DNI.  History of Present Illness:  This is an 80 year old male with an extensive past medical history who was admitted to [facility] on September 09, 2020 in the setting of a large MCA stroke.  He was admitted by the stroke service and has received care from them since arrival.  Because of the large stroke a small bore feeding tube was placed and speech therapy has been working with him.  Today nursing reported increasing cough and increasing work of breathing after he had been taking by mouth with assistance from family.  There is concerned that he had an aspiration event.  Pulmonary and critical care medicine was consulted because of the increased work of breathing and cough.  The patient is unable to give me an extensive history due to some confusion though he tells me that he has had shortness of breath today and he does not have this at baseline.  He says that to his knowledge she has never had asthma or COPD and he does not take any inhaled medicines at home.  He says he feels a bit short of breath right now.  He does not recall a specific event where he choked on food.  Most of the history is obtained by chart review.  Upon review of the record it seems that the patient was noted to have some difficulty standing and walking while at rehab.  He was found to have a right MCA M2 branch occlusion.  The interventional radiology team was activated and performed revascularization of an occluded dominant right MCA inferior division distal M2/3 artery with placement of  a stent and right ICA balloon angioplasty.  Secondary stroke work-up has been performed which showed a normal LVEF, hemoglobin A1c was elevated at 7.8, PT is recommended that he be placed in CIR.  His blood pressure has been maintained according to standard guidelines during this hospitalization.  He was started on hypertonic saline for concern for cerebral edema related to his MCA stroke.  This was started on March 2.  He has been maintained on Keppra 500 mg twice daily for concern for seizures as he was also noted to have a small subarachnoid hemorrhage on CT head after the procedure.  Clonidine patch was added on March 2.  Past Medical History:  Traumatic brain injury Obstructive sleep apnea Hypertension Hyperlipidemia GERD Diabetes mellitus type 2 AV block, Mobitz type II History of subdural hematoma with associated traumatic brain injury in 2019 status post evacuation. Allergic rhinitis  Significant Hospital Events:  February 28 admission for right MCA stroke, status post balloon angioplasty to right internal carotid artery and stent placement and right MCA M2/3 artery, concern for subarachnoid hemorrhage in right sylvian fissure and right frontotemporal convexity without mass-effect  Consults:  PCCM  Procedures:  2/28 IR thrombectomy  Significant Diagnostic Tests:  February 28 CT head positive for emergent occlusion of right MCA M2 artery with associated perfusion deficits and penumbra, superimposed very bulky calcified plaque right internal carotid artery, anterior communicating artery aneurysm, proximal left ICA calcification March 1 CT head: Increased  subarachnoid hemorrhage/contrast around the right sylvian fissure also with some interval parenchymal hemorrhage in the infarct territory with small volume intraventricular extension, right perisylvian cortical infarct correlating with affected vessel and perfusion maps, chronic subdural hematomas  March 1 MRI brain large  acute/subacute left-sided infarct with parenchymal hemorrhage measuring over 4 cm, pH to hemorrhage, element of acute right subarachnoid hemorrhage, chronic bilateral subdural hematomas, no significant midline shift March 1 CT head 7 PM, evolving right MCA territory infarction and hemorrhagic conversion, parenchymal sulcal subarachnoid intraventricular hemorrhage also present, bilateral subdural hematomas again identified a small volume hyperintense hemorrhage March 3 CT head large acute right MCA branch infarct without visible progression, related hemorrhages decreasing, stable subdural hematomas without significant mass-effect  Micro Data:  February 28 SARS-CoV-2/influenza negative  Antimicrobials:  March 3 cefepime >   Interim History / Subjective:  Feeling improved this morning No respiratory complaints  I have taken pt off of [place] -- SpO2 have remained > 95%   States he feels cold   Objective   Blood pressure (!) 179/127, pulse 96, temperature 98.6 F (37 C), temperature source Oral, resp. rate (!) 23, height 5\' 9"  (1.753 m), weight 99 kg, SpO2 98 %.        Intake/Output Summary (Last 24 hours) at 09/13/2020 0751 Last data filed at 09/13/2020 0737 Gross per 24 hour  Intake 2000.8 ml  Output 5400 ml  Net -3399.2 ml   Filed Weights   09/09/20 1100 09/09/20 1129  Weight: 99 kg 99 kg    Examination: General: Chronically and acutely ill appearing elderly M, reclined in bed NAD  HENT: [place]. Cortrak in place. Anicteric sclera  PULM: Slight RUL expiratory wheeze. Symmetrical chest expansion, even and unlabored respiratory effort on RA  CV: rrr s1s2 cap refill brisk.  GI: soft round ndnt + bowel sounds  MSK: no acute deformity. No cyanosis or clubbing Neuro: Awakens to voice, oriented x 3 following commands. Shivering.    Resolved Hospital Problem list     Assessment & Plan:   Acute respiratory failure with hypoxia, improving  Aspiration event Cough: possible vocal cord  inflammation + GERD  P  -short course of cefepime for aspiration if no fever, worsening leukocytosis. Start date 3/3 -aspiration precautions  -SLP  -cont cortrak  -PRN delsym -[place] mucinex  -PPI   Acute R MCA CVA Cerebral Edema Traumatic SDH -Per Neuro  -3%  -Keppra  OSA  -unclear CPAP use at home   DM2  -SSI + q4h CBG   Case discussed with Dr. Leonie Man 09/13/20. CCM will sign off. Please re-engage if patient's clinical status changes or if we can be of further assistance.   Best practice (evaluated daily)  Diet: tube feeding Pain/Anxiety/Delirium protocol (if indicated): n/a VAP protocol (if indicated): n/a DVT prophylaxis: sub q heparin GI prophylaxis: pantoprazole Glucose control: As above Mobility: PT following Disposition:  ICU   Goals of Care:  Last date of multidisciplinary goals of care discussion:2/28 Family and staff present: family, neurology team Summary of discussion: DNI, CPR OK Follow up goals of care discussion due: 3/6 Code Status: DNI, CPR OK  Labs   CBC: Recent Labs  Lab 09/09/20 1101 09/09/20 1103 09/10/20 0606 09/11/20 0440 09/12/20 0514  WBC 6.2  --  8.2 7.6 6.6  NEUTROABS 3.9  --  6.7  --   --   HGB 11.2* 12.2* 9.6* 9.0* 9.1*  HCT 34.8* 36.0* 28.9* 27.7* 29.9*  MCV 84.1  --  82.6 85.2 86.2  PLT  124*  --  143* 123* 113*    Basic Metabolic Panel: Recent Labs  Lab 09/09/20 1101 09/09/20 1103 09/10/20 0606 09/11/20 0440 09/11/20 1112 09/11/20 2218 09/12/20 0514 09/12/20 0946 09/12/20 1725 09/12/20 2219  NA 133* 136 136 137   < > 143 145 147* 147* 152*  K 4.7 4.9 4.6 4.5  --   --  4.1  --   --   --   CL 100 100 106 108  --   --  116*  --   --   --   CO2 24  --  23 18*  --   --  20*  --   --   --   GLUCOSE 163* 158* 182* 201*  --   --  228*  --   --   --   BUN 35* 47* 34* 34*  --   --  29*  --   --   --   CREATININE 1.90* 1.80* 1.98* 1.85*  --   --  1.47*  --   --   --   CALCIUM 8.8*  --  8.2* 8.2*  --   --  8.2*  --   --   --    MG  --   --   --  2.0  --   --  1.7  --  1.8  --   PHOS  --   --   --  4.0  --   --  3.3  --  3.4  --    < > = values in this interval not displayed.   GFR: Estimated Creatinine Clearance: 45.7 mL/min (A) (by C-G formula based on SCr of 1.47 mg/dL (H)). Recent Labs  Lab 09/09/20 1101 09/10/20 0606 09/11/20 0440 09/12/20 0514  WBC 6.2 8.2 7.6 6.6    Liver Function Tests: Recent Labs  Lab 09/09/20 1101  AST 22  ALT 22  ALKPHOS 69  BILITOT 0.7  PROT 6.4*  ALBUMIN 3.6   No results for input(s): LIPASE, AMYLASE in the last 168 hours. No results for input(s): AMMONIA in the last 168 hours.  ABG    Component Value Date/Time   TCO2 27 09/09/2020 1103     Coagulation Profile: Recent Labs  Lab 09/09/20 1101  INR 1.0    Cardiac Enzymes: No results for input(s): CKTOTAL, CKMB, CKMBINDEX, TROPONINI in the last 168 hours.  HbA1C: Hgb A1c MFr Bld  Date/Time Value Ref Range Status  09/10/2020 06:06 AM 7.8 (H) 4.8 - 5.6 % Final    Comment:    (NOTE) Pre diabetes:          5.7%-6.4%  Diabetes:              >6.4%  Glycemic control for   <7.0% adults with diabetes   08/21/2020 09:45 AM 7.6 (H) 4.6 - 6.5 % Final    Comment:    Glycemic Control Guidelines for People with Diabetes:Non Diabetic:  <6%Goal of Therapy: <7%Additional Action Suggested:  >8%     CBG: Recent Labs  Lab 09/12/20 1147 09/12/20 1556 09/12/20 1943 09/12/20 2348 09/13/20 0351  GLUCAP 182* 253* 229* 152* 200*    CCT: n/a   Eliseo Gum MSN, AGACNP-BC [place] [phone] If no answer, [phone] 09/13/2020, 9:21 AM

## 2020-09-13 NOTE — Progress Notes (Signed)
Modified Barium Swallow Progress Note  Patient Details  Name: Frank [name] MRN: [id number] Date of Birth: [birth year]  Today's Date: 09/13/2020  Modified Barium Swallow completed.  Full report located under Chart Review in the Imaging Section.  Brief recommendations include the following:  Clinical Impression  Pt demonstrates mild dysphagia with a timing impairment with premature spillage/delayed swallow initiation. WIth thin liquids pooled in pyriforms prior to the swallow with flash penetration. There is also mild pyriform residue with silent aspiration of residue during the subsequent swallow. Pt is alert and following commands, but keeps eyes closed his awareness of bolus presentation is at times poor and he cannot always pull liquids through the straw. Given his dulled mentation and epidoes of poor timing with swallowing, increasing with fatigue pt is recommended to consume nectar thick liquids and pureed solids. Potential for upgrade good as pts endurance improved.   Swallow Evaluation Recommendations       SLP Diet Recommendations: Dysphagia 1 (Puree) solids;Nectar thick liquid   Liquid Administration via: Cup;Straw   Medication Administration: Whole meds with puree   Supervision: Staff to assist with self feeding;Full supervision/cueing for compensatory strategies   Compensations: Slow rate;Small sips/bites   Postural Changes: Seated upright at 90 degrees   Oral Care Recommendations: Oral care BID   Other Recommendations: Have oral suction available   [name], MA CCC-SLP  Acute Rehabilitation Services Pager [phone] Office [phone]  [name] 09/13/2020,3:07 PM

## 2020-09-14 DIAGNOSIS — G936 Cerebral edema: Secondary | ICD-10-CM | POA: Diagnosis not present

## 2020-09-14 DIAGNOSIS — R1312 Dysphagia, oropharyngeal phase: Secondary | ICD-10-CM

## 2020-09-14 DIAGNOSIS — I63512 Cerebral infarction due to unspecified occlusion or stenosis of left middle cerebral artery: Secondary | ICD-10-CM | POA: Diagnosis not present

## 2020-09-14 DIAGNOSIS — S065X9A Traumatic subdural hemorrhage with loss of consciousness of unspecified duration, initial encounter: Secondary | ICD-10-CM

## 2020-09-14 DIAGNOSIS — J9601 Acute respiratory failure with hypoxia: Secondary | ICD-10-CM

## 2020-09-14 DIAGNOSIS — I6601 Occlusion and stenosis of right middle cerebral artery: Secondary | ICD-10-CM | POA: Diagnosis not present

## 2020-09-14 LAB — SODIUM
Sodium: 149 mmol/L — ABNORMAL HIGH (ref 135–145)
Sodium: 150 mmol/L — ABNORMAL HIGH (ref 135–145)

## 2020-09-14 LAB — GLUCOSE, CAPILLARY
Glucose-Capillary: 112 mg/dL — ABNORMAL HIGH (ref 70–99)
Glucose-Capillary: 150 mg/dL — ABNORMAL HIGH (ref 70–99)
Glucose-Capillary: 151 mg/dL — ABNORMAL HIGH (ref 70–99)
Glucose-Capillary: 228 mg/dL — ABNORMAL HIGH (ref 70–99)
Glucose-Capillary: 252 mg/dL — ABNORMAL HIGH (ref 70–99)
Glucose-Capillary: 253 mg/dL — ABNORMAL HIGH (ref 70–99)

## 2020-09-14 MED ORDER — SODIUM CHLORIDE 3 % IV SOLN
INTRAVENOUS | Status: DC
  Filled 2020-09-14: qty 500

## 2020-09-14 MED ORDER — LEVETIRACETAM 100 MG/ML PO SOLN
500.0000 mg | Freq: Two times a day (BID) | ORAL | Status: DC
  Administered 2020-09-14 – 2020-09-17 (×7): 500 mg
  Filled 2020-09-14 (×7): qty 5

## 2020-09-14 NOTE — Progress Notes (Signed)
STROKE TEAM PROGRESS NOTE   INTERVAL HISTORY No acute events overnight. His son and RN are at the bedside. Pt transition to bedside commod with 2 assist. Neuro stable, still has mild left hemiparesis. Plan to taper off 3% saline today. Na 150.   Vitals:   09/14/20 0500 09/14/20 0600 09/14/20 0700 09/14/20 0800  BP: (!) 159/67 (!) 163/74 (!) 158/70 (!) 159/71  Pulse: 75 83 68 71  Resp: 19 (!) 31 20 19   Temp:      TempSrc:      SpO2: 93% 96% 97% 93%  Weight: 94.9 kg     Height:       CBC:  Recent Labs  Lab 09/09/20 1101 09/09/20 1103 09/10/20 0606 09/11/20 0440 09/12/20 0514 09/13/20 0625  WBC 6.2  --  8.2   < > 6.6 6.4  NEUTROABS 3.9  --  6.7  --   --   --   HGB 11.2*   < > 9.6*   < > 9.1* 9.7*  HCT 34.8*   < > 28.9*   < > 29.9* 31.5*  MCV 84.1  --  82.6   < > 86.2 86.3  PLT 124*  --  143*   < > 113* 129*   < > = values in this interval not displayed.   Basic Metabolic Panel:  Recent Labs  Lab 09/12/20 0514 09/12/20 0946 09/13/20 0625 09/13/20 1026 09/13/20 1627 09/13/20 2201 09/14/20 0457 09/14/20 1006  NA 145   < > 152*   < > 155*   < > 149* 150*  K 4.1  --  4.1  --   --   --   --   --   CL 116*  --  117*  --   --   --   --   --   CO2 20*  --  25  --   --   --   --   --   GLUCOSE 228*  --  238*  --   --   --   --   --   BUN 29*  --  33*  --   --   --   --   --   CREATININE 1.47*  --  1.46*  --   --   --   --   --   CALCIUM 8.2*  --  9.0  --   --   --   --   --   MG 1.7   < > 1.8  --  1.7  --   --   --   PHOS 3.3   < > 3.4  --  2.9  --   --   --    < > = values in this interval not displayed.   Lipid Panel:  Recent Labs  Lab 09/10/20 0606 09/13/20 0625  CHOL 107  --   TRIG 210* 77  HDL 36*  --   CHOLHDL 3.0  --   VLDL 42*  --   LDLCALC 29  --    HgbA1c:  Recent Labs  Lab 09/10/20 0606  HGBA1C 7.8*   Urine Drug Screen: pending  Result Date 09/10/2020 Mri brain/ MRA head 1. Large acute/subacute left-sided infarct with parenchymal hemorrhage  measuring over 4 cm. PH2 hemorrhage. 2. Element of acute right subarachnoid hemorrhage. 3. Chronic bilateral subdural hematomas. 4. No significant midline shift. IMAGING past 24 hours CT HEAD WO CONTRAST  Result Date: 09/10/2020 IMPRESSION:  1. Increased subarachnoid  hemorrhage/contrast around the right sylvian fissure. There is also some interval parenchymal hemorrhage in the infarct territory with small volume intraventricular extension. No hydrocephalus or midline shift.  2. Right perisylvian cortical infarct correlating with affected vessel and the perfusion maps.  3. Chronic subdural hematomas.   DG Abd Portable 1V  Result Date: 09/09/2020 Nasogastric tube not identified within the visualized distal esophagus and abdomen. Chest radiograph may be helpful to identify the location of a malpositioned catheter.   Result Date: 09/09/2020 ECHOCARDIOGRAM REPORT   1. Left ventricular ejection fraction, by estimation, is 65 to 70%. The left ventricle has normal function. The left ventricle has no regional wall motion abnormalities. There is severe concentric left ventricular hypertrophy. Left ventricular diastolic  parameters are consistent with Grade I diastolic dysfunction (impaired relaxation). Elevated left atrial pressure.   2. Right ventricular systolic function is normal. The right ventricular size is mildly enlarged. There is normal pulmonary artery systolic pressure.   3. The mitral valve is normal in structure. No evidence of mitral valve regurgitation. Mild mitral stenosis. The mean mitral valve gradient is 3.0 mmHg. Moderate mitral annular calcification.   4. The aortic valve is normal in structure. There is severe calcifcation of the aortic valve. There is severe thickening of the aortic valve. Aortic valve regurgitation is not visualized. Mild aortic valve stenosis. Aortic valve mean gradient measures 9.0 mmHg.   5. The inferior vena cava is normal in size with greater than 50%  respiratory variability, suggesting right atrial pressure of 3 mmHg.   CT ANGIO Head/neck CT perfusion CODE STROKE Result Date: 09/09/2020  IMPRESSION:  1. Positive for emergent occlusion of a posterior Right MCA distal M2 branch at its bifurcation. Embolized calcification there is new since last year (series 12, image 14).  2. CT Perfusion detects a small infarct core of 13 mL with regional penumbra of 41 mL (28 mL mismatch).  3. Superimposed very bulky calcified plaque at the Right ICA origin with tandem HIGH-GRADE STENOSIS, including Radiographic String Sign (series 7, image 216).  4. 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  5. Bulky Left Carotid bifurcation and proximal left ICA calcified plaque without significant stenosis.  6. Up to moderate Right Vertebral Artery origin stenosis.    PHYSICAL EXAM Obese elderly Caucasian male not in distress. . Afebrile. Head is nontraumatic. Neck is supple without bruit.    Cardiac exam no murmur or gallop. Lungs are clear to auscultation. Distal pulses are well felt. Neurological Exam : Patient is awake, alert and interactive today.  Follows all simple commands. No aphasia, able to name and repeat. Mild dysarthria.  He has mild dysarthria and has right gaze preference but able to look to the left.  Visual field full but with left simultagnosia.  Facial symmetrical.  Tongue midline.  Motor system exam shows left hemiparesis with LUE drift but proximal 3+/5 and distal 3/5 with finger grip. RUE 4+/5. BLE proximal 4/5 and RLE distal DF/PF 5/5 and LLE distal DF/PF 4/5. Sensation symmetrical. FTN intact but slower on the left.   ASSESSMENT/PLAN [name] is a 80 y.o. 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  Was found to have a right MCA M2 branch occlusion.  .   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  ASPECTS is 10.    CTA head cclusion of a posterior Right MCA distal M2 branch at its bifurcation.  CTA neck right ICA origin with tandem HIGH-GRADE STENOSIS  CT perfusion small infarct core of 13 mL with regional penumbra of 41 mL (28 mL mismatch).   MRI/MRA h: Large acute/subacute left-sided infarct with parenchymal hemorrhage measuring over 4 cm. PH2 hemorrhage. Element of acute right subarachnoid hemorrhage. Chronic bilateral subdural hematomas.  2D Echo EF 65-70%, severe LVH, Grade I diastolic dysfunction  LDL 29  HgbA1c 7.8  VTE prophylaxis - SCDs, Heparin 5000units q 8hr  No antithrombotic prior to admission, now on aspirin 81 mg daily and ticagrelor 45mg  daily (reduced secondary to [facility] hemorrhage)  Therapy recommendations:  CIR  Disposition:  CIR pending approval/acceptance/stabilty   Subarachnoid Hemorrhage  S/p Thrombectomy, stent placement distal M2, balloon angioplasty  CT head: Increased subarachnoid hemorrhage the right sylvian fissure.    SBP goal changed to <160  Cerebral edema  3% sodium chloride at 22ml/hr -> taper off  Allow gradual Na trending down  CT repeat 3/3 no visible progression  Na 154->150->150  Dysphagia  Likely due to stroke  Now on dys1 and nectar thick  Also on TF @ 55  Consider to remove cortrak if pt eats well  Seizure history  Previously on Keppra due to seizures but eventually weaned off  Keppra decreased 3/2 to 500mg  bid for prophylaxis due to somnolence  EEG (3/2) suggestive of cortical dysfunction in right hemisphere and moderate diffuse encephalopathy.  Chronic b/l SDH  CT head and MRI  brain showed b/l subacute to chronic SDH  Did not receive tPA  Now on DAPT with half dose of brilinta  Stable  Acute respiratory failure with hypoxia, improving  Aspiration event  short course of cefepime for aspiration -> off   afebrile  aspiration precautions   SLP   CCM signed off  Hypertension  Home meds:  Amlodipine 10mg  daily, Cadura 2mg  at bedtime, Micardis 20mg  daily  Cleviprex weaned off, on Amlodipine and Cardura  Clonodine patch added 3/2  SBP goal <160 in the setting of SAH, ICH . hydralazine 20mg  q6 hours, labetolol 20 q2 prn . Long-term BP goal normotensive  Hyperlipidemia  Home meds: atorvastatin 20 daily  LDL 29, goal < 70  Started on atorvastatin 10mg  daily - no high intensity given low LDL  Continue statin at discharge  Diabetes type II Uncontrolled  Home meds:  Glipizide 10mg  daily, glargine 10units daily  HgbA1c 7.8, goal < 7.0  CBGs  SSI  lantus 5 units qHS, Novolog 2units q 4 hours   Appreciate diabetes coordinator recommendations  CKD IIIb  Likely due to contrast load for imaging/IR and dehydration  Creatinine level 1.98 -> 1.85->1.47->1.46  Adequate uop  BMP daily  Other Stroke Risk Factors  Advanced Age >/= 47   Obesity, Body mass index is 30.9 kg/m., BMI >/= 30 associated with increased stroke risk, recommend weight loss, diet and exercise as appropriate   Coronary artery disease AV block, Mobitz 2, pacer  Obstructive sleep apnea, unclear if he is on CPAP at home  Other Active Problems  Anxiety/depression: prozac  GERD: protonix  Anemia: iron deficient and B12 deficient  Hospital day #6  This  patient is critically ill due to large left infarct with sICH and SAH, chronic SDH, cerebral edema, seizure, CKD and at significant risk of neurological worsening, death form recurrent stroke, hemorrhagic conversion, brain herniation, renal failure, status epilepticus. This patient's care requires constant  monitoring of vital signs, hemodynamics, respiratory and cardiac monitoring, review of multiple databases, neurological assessment, discussion with family, other specialists and medical decision making of high complexity. I spent 35 minutes of neurocritical care time in the care of this patient. I had long discussion with son at bedside, updated pt current condition, treatment plan and potential prognosis, and answered all the questions. He expressed understanding and appreciation.    Rosalin Hawking, MD PhD Stroke Neurology 09/15/2020 12:29 AM    To contact Stroke Continuity provider, please refer to [email or web address] After hours, contact General Neurology

## 2020-09-15 ENCOUNTER — Other Ambulatory Visit: Payer: Self-pay | Admitting: Internal Medicine

## 2020-09-15 DIAGNOSIS — I63512 Cerebral infarction due to unspecified occlusion or stenosis of left middle cerebral artery: Secondary | ICD-10-CM | POA: Diagnosis not present

## 2020-09-15 DIAGNOSIS — I639 Cerebral infarction, unspecified: Secondary | ICD-10-CM | POA: Diagnosis not present

## 2020-09-15 DIAGNOSIS — I6601 Occlusion and stenosis of right middle cerebral artery: Secondary | ICD-10-CM | POA: Diagnosis not present

## 2020-09-15 DIAGNOSIS — I6389 Other cerebral infarction: Secondary | ICD-10-CM

## 2020-09-15 LAB — GLUCOSE, CAPILLARY
Glucose-Capillary: 220 mg/dL — ABNORMAL HIGH (ref 70–99)
Glucose-Capillary: 246 mg/dL — ABNORMAL HIGH (ref 70–99)
Glucose-Capillary: 239 mg/dL — ABNORMAL HIGH (ref 70–99)
Glucose-Capillary: 162 mg/dL — ABNORMAL HIGH (ref 70–99)
Glucose-Capillary: 255 mg/dL — ABNORMAL HIGH (ref 70–99)

## 2020-09-15 LAB — BASIC METABOLIC PANEL
Anion gap: 8 (ref 5–15)
BUN: 34 mg/dL — ABNORMAL HIGH (ref 8–23)
CO2: 26 mmol/L (ref 22–32)
Calcium: 8.5 mg/dL — ABNORMAL LOW (ref 8.9–10.3)
Chloride: 117 mmol/L — ABNORMAL HIGH (ref 98–111)
Creatinine, Ser: 1.45 mg/dL — ABNORMAL HIGH (ref 0.61–1.24)
GFR, Estimated: 48 mL/min — ABNORMAL LOW (ref >60)
Glucose, Bld: 195 mg/dL — ABNORMAL HIGH (ref 70–99)
Potassium: 4.1 mmol/L (ref 3.5–5.1)
Sodium: 151 mmol/L — ABNORMAL HIGH (ref 135–145)

## 2020-09-15 LAB — CBC
HCT: 31.6 % — ABNORMAL LOW (ref 39.0–52.0)
Hemoglobin: 9.7 g/dL — ABNORMAL LOW (ref 13.0–17.0)
MCH: 26.8 pg (ref 26.0–34.0)
MCHC: 30.7 g/dL (ref 30.0–36.0)
MCV: 87.3 fL (ref 80.0–100.0)
Platelets: 124 10*3/uL — ABNORMAL LOW (ref 150–400)
RBC: 3.62 MIL/uL — ABNORMAL LOW (ref 4.22–5.81)
RDW: 14 % (ref 11.5–15.5)
WBC: 6.3 10*3/uL (ref 4.0–10.5)
nRBC: 0 % (ref 0.0–0.2)

## 2020-09-15 MED ORDER — OSMOLITE 1.5 CAL PO LIQD: 840.0000 mL | ORAL | Status: DC

## 2020-09-15 MED ORDER — OSMOLITE 1.5 CAL PO LIQD
720.0000 mL | ORAL | Status: DC
  Administered 2020-09-15: 720 mL
  Filled 2020-09-15 (×2): qty 948

## 2020-09-15 MED ORDER — HYDRALAZINE HCL 20 MG/ML IJ SOLN: 5.0000 mg | INTRAMUSCULAR | Status: DC | PRN

## 2020-09-15 MED ORDER — FREE WATER
150.0000 mL | Freq: Four times a day (QID) | Status: DC
  Administered 2020-09-15 – 2020-09-16 (×5): 150 mL

## 2020-09-15 NOTE — Progress Notes (Addendum)
Pt's TF started at 2121 d/t not being started by handoff unit at prescribed time.

## 2020-09-15 NOTE — Progress Notes (Signed)
STROKE TEAM PROGRESS NOTE   INTERVAL HISTORY Son at the bedside. No acute events overnight. Pt off 3% now. Still on TF and no appetite for food but ok with drinking. Will change to nocturnal TF to encourage po intake. Na 151. Will add FW.   Vitals:   09/15/20 0700 09/15/20 0800 09/15/20 0900 09/15/20 1000  BP: (!) 145/62 127/73 135/80 140/79  Pulse: 71 78 66 76  Resp: 17 18 18 16   Temp:  97.7 F (36.5 C)    TempSrc:  Oral    SpO2: 100% 98% 100% 98%  Weight:      Height:       CBC:  Recent Labs  Lab 09/09/20 1101 09/09/20 1103 09/10/20 0606 09/11/20 0440 09/13/20 0625 09/15/20 0150  WBC 6.2  --  8.2   < > 6.4 6.3  NEUTROABS 3.9  --  6.7  --   --   --   HGB 11.2*   < > 9.6*   < > 9.7* 9.7*  HCT 34.8*   < > 28.9*   < > 31.5* 31.6*  MCV 84.1  --  82.6   < > 86.3 87.3  PLT 124*  --  143*   < > 129* 124*   < > = values in this interval not displayed.   Basic Metabolic Panel:  Recent Labs  Lab 09/13/20 0625 09/13/20 1026 09/13/20 1627 09/13/20 2201 09/14/20 1006 09/15/20 0150  NA 152*   < > 155*   < > 150* 151*  K 4.1  --   --   --   --  4.1  CL 117*  --   --   --   --  117*  CO2 25  --   --   --   --  26  GLUCOSE 238*  --   --   --   --  195*  BUN 33*  --   --   --   --  34*  CREATININE 1.46*  --   --   --   --  1.45*  CALCIUM 9.0  --   --   --   --  8.5*  MG 1.8  --  1.7  --   --   --   PHOS 3.4  --  2.9  --   --   --    < > = values in this interval not displayed.   Lipid Panel:  Recent Labs  Lab 09/10/20 0606 09/13/20 0625  CHOL 107  --   TRIG 210* 77  HDL 36*  --   CHOLHDL 3.0  --   VLDL 42*  --   LDLCALC 29  --    HgbA1c:  Recent Labs  Lab 09/10/20 0606  HGBA1C 7.8*   Urine Drug Screen: pending  Result Date 09/10/2020 Mri brain/ MRA head 1. Large acute/subacute left-sided infarct with parenchymal hemorrhage measuring over 4 cm. PH2 hemorrhage. 2. Element of acute right subarachnoid hemorrhage. 3. Chronic bilateral subdural hematomas. 4. No  significant midline shift. IMAGING past 24 hours CT HEAD WO CONTRAST  Result Date: 09/10/2020 IMPRESSION:  1. Increased subarachnoid hemorrhage/contrast around the right sylvian fissure. There is also some interval parenchymal hemorrhage in the infarct territory with small volume intraventricular extension. No hydrocephalus or midline shift.  2. Right perisylvian cortical infarct correlating with affected vessel and the perfusion maps.  3. Chronic subdural hematomas.   DG Abd Portable 1V  Result Date: 09/09/2020 Nasogastric tube not identified within  the visualized distal esophagus and abdomen. Chest radiograph may be helpful to identify the location of a malpositioned catheter.   Result Date: 09/09/2020 ECHOCARDIOGRAM REPORT   1. Left ventricular ejection fraction, by estimation, is 65 to 70%. The left ventricle has normal function. The left ventricle has no regional wall motion abnormalities. There is severe concentric left ventricular hypertrophy. Left ventricular diastolic  parameters are consistent with Grade I diastolic dysfunction (impaired relaxation). Elevated left atrial pressure.   2. Right ventricular systolic function is normal. The right ventricular size is mildly enlarged. There is normal pulmonary artery systolic pressure.   3. The mitral valve is normal in structure. No evidence of mitral valve regurgitation. Mild mitral stenosis. The mean mitral valve gradient is 3.0 mmHg. Moderate mitral annular calcification.   4. The aortic valve is normal in structure. There is severe calcifcation of the aortic valve. There is severe thickening of the aortic valve. Aortic valve regurgitation is not visualized. Mild aortic valve stenosis. Aortic valve mean gradient measures 9.0 mmHg.   5. The inferior vena cava is normal in size with greater than 50% respiratory variability, suggesting right atrial pressure of 3 mmHg.   CT ANGIO Head/neck CT perfusion CODE STROKE Result Date: 09/09/2020   IMPRESSION:  1. Positive for emergent occlusion of a posterior Right MCA distal M2 branch at its bifurcation. Embolized calcification there is new since last year (series 12, image 14).  2. CT Perfusion detects a small infarct core of 13 mL with regional penumbra of 41 mL (28 mL mismatch).  3. Superimposed very bulky calcified plaque at the Right ICA origin with tandem HIGH-GRADE STENOSIS, including Radiographic String Sign (series 7, image 216).  4. 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  5. Bulky Left Carotid bifurcation and proximal left ICA calcified plaque without significant stenosis.  6. Up to moderate Right Vertebral Artery origin stenosis.    PHYSICAL EXAM Obese elderly Caucasian male not in distress. . Afebrile. Head is nontraumatic. Neck is supple without bruit.    Cardiac exam no murmur or gallop. Lungs are clear to auscultation. Distal pulses are well felt. Neurological Exam : Patient is awake, alert and interactive today.  Follows all simple commands. No aphasia, able to name and repeat. Mild dysarthria.  He has mild dysarthria and has right gaze preference but able to look to the left.  Visual field full but with left simultagnosia.  Facial symmetrical.  Tongue midline.  Motor system exam shows left hemiparesis with LUE drift but proximal 3+/5 and distal 3/5 with finger grip. RUE 4+/5. BLE proximal 4/5 and RLE distal DF/PF 5/5 and LLE distal DF/PF 4/5. Sensation symmetrical. FTN intact but slower on the left.   ASSESSMENT/PLAN [name] is a 80 y.o. 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  Was found to have a right MCA M2 branch occlusion.  .   Stroke: large left MCA infarct with PH2 hemorrhagic conversion due to right  MCA occlusion with calcified thrombus and R ICA high grade stenosis, s/p IR with TICI3 and M2/3 stenting and R ICA angioplasty  CT head No acute demarcated cortical infarct is identified.  ASPECTS is 10.    CTA head cclusion of a posterior Right MCA distal M2 branch at its  bifurcation.  CTA neck right ICA origin with tandem HIGH-GRADE STENOSIS  CT perfusion small infarct core of 13 mL with regional penumbra of 41 mL (28 mL mismatch).   MRI/MRA h: Large acute/subacute left-sided infarct with parenchymal hemorrhage measuring over 4 cm. PH2 hemorrhage. Element of acute right subarachnoid hemorrhage. Chronic bilateral subdural hematomas.  2D Echo EF 65-70%, severe LVH, Grade I diastolic dysfunction  LDL 29  HgbA1c 7.8  VTE prophylaxis - SCDs, Heparin 5000units q 8hr  No antithrombotic prior to admission, now on aspirin 81 mg daily and ticagrelor 45mg  daily (reduced secondary to PH2 hemorrhage)  Therapy recommendations:  CIR->but family wants SNF  Disposition:  Pending    Subarachnoid Hemorrhage  S/p Thrombectomy, stent placement distal M2, balloon angioplasty  CT head 3/1: Increased subarachnoid hemorrhage the right sylvian fissure.    CT head 3/3: Related hemorrhage is decreasing.  SBP goal changed to <160  Cerebral edema  3% sodium chloride at 30ml/hr -> taper off  Allow gradual Na trending down  CT repeat 3/3 no visible progression  Na 154->150->150->151  Will add FW 150 Q6h  Dysphagia  Likely due to stroke  Now on dys1 and nectar thick  Also on TF @ 55 -> change to nocturnal TF to encourage po intake  Add FW 150 Q6  Will do calorie count  Consider to remove cortrak if pt eats well  Seizure history  Previously on Keppra due to seizures but eventually weaned off  Keppra decreased 3/2 to 500mg  bid for prophylaxis due to somnolence  EEG (3/2) suggestive of cortical dysfunction in right hemisphere and moderate diffuse encephalopathy.  Chronic b/l  SDH  CT head and MRI brain showed b/l subacute to chronic SDH  Did not receive tPA  Now on DAPT with half dose of brilinta  Stable on repeat CTs  Acute respiratory failure with hypoxia, improving  Aspiration event  short course of cefepime for aspiration -> off   afebrile  aspiration precautions   SLP   CCM signed off  Hypertension  Home meds:  Amlodipine 10mg  daily, Cadura 2mg  at bedtime, Micardis 20mg  daily  Cleviprex weaned off, on Amlodipine and Cardura  Clonodine patch added 3/2  SBP goal <160 in the setting of SAH, ICH . hydralazine 20mg  q6 hours, labetolol 20 q2 prn . Long-term BP goal normotensive  Hyperlipidemia  Home meds: atorvastatin 20 daily  LDL 29, goal < 70  Started on atorvastatin 10mg  daily - no high intensity given low LDL  Continue statin at discharge  Diabetes type II Uncontrolled  Home meds:  Glipizide 10mg  daily, glargine 10units daily  HgbA1c 7.8, goal < 7.0  CBGs  SSI  lantus 5 units qHS, Novolog 2units q 4 hours   Appreciate diabetes coordinator recommendations  CKD IIIb  Likely due to contrast load for imaging/IR and dehydration  Creatinine level 1.98 -> 1.85->1.47->1.46->1.45  On TF and FW  BMP daily  Other Stroke Risk Factors  Advanced Age >/= 28   Obesity, Body mass index is 30.9 kg/m., BMI >/= 30 associated with increased stroke risk, recommend weight loss, diet and exercise as appropriate   Coronary artery disease AV block, Mobitz 2, pacer  Obstructive sleep apnea, unclear if he is on CPAP at home  Other Active Problems  Anxiety/depression: prozac  GERD: protonix  Anemia: iron deficient and B12 deficient  Hospital day #6  This patient is critically ill due to large right infarct with hemorrhagic conversion, SDH chronic, cerebral edema, seizure, CKD, dysphagia and  at significant risk of neurological worsening, death form hematoma expansion, recurrent stroke, status epilepticus, renal failure,  aspiration and sepsis. This patient's care requires constant monitoring of vital signs, hemodynamics, respiratory and cardiac monitoring, review of multiple databases, neurological assessment, discussion with family, other specialists and medical decision making of high complexity. I spent 35 minutes of neurocritical care time in the care of this patient. I had long discussion with son at bedside, updated pt current condition, treatment plan and potential prognosis, and answered all the questions. He expressed understanding and appreciation.    Rosalin Hawking, MD PhD Stroke Neurology 09/15/2020 12:34 PM    To contact Stroke Continuity provider, please refer to [email or web address] After hours, contact General Neurology

## 2020-09-16 ENCOUNTER — Other Ambulatory Visit: Payer: Self-pay | Admitting: Internal Medicine

## 2020-09-16 DIAGNOSIS — I639 Cerebral infarction, unspecified: Secondary | ICD-10-CM | POA: Diagnosis not present

## 2020-09-16 LAB — BASIC METABOLIC PANEL
Anion gap: 8 (ref 5–15)
BUN: 38 mg/dL — ABNORMAL HIGH (ref 8–23)
CO2: 25 mmol/L (ref 22–32)
Calcium: 8.6 mg/dL — ABNORMAL LOW (ref 8.9–10.3)
Chloride: 109 mmol/L (ref 98–111)
Creatinine, Ser: 1.35 mg/dL — ABNORMAL HIGH (ref 0.61–1.24)
GFR, Estimated: 53 mL/min — ABNORMAL LOW (ref >60)
Glucose, Bld: 211 mg/dL — ABNORMAL HIGH (ref 70–99)
Potassium: 4.1 mmol/L (ref 3.5–5.1)
Sodium: 142 mmol/L (ref 135–145)

## 2020-09-16 LAB — CBC
HCT: 29.1 % — ABNORMAL LOW (ref 39.0–52.0)
Hemoglobin: 9 g/dL — ABNORMAL LOW (ref 13.0–17.0)
MCH: 27.1 pg (ref 26.0–34.0)
MCHC: 30.9 g/dL (ref 30.0–36.0)
MCV: 87.7 fL (ref 80.0–100.0)
Platelets: 129 10*3/uL — ABNORMAL LOW (ref 150–400)
RBC: 3.32 MIL/uL — ABNORMAL LOW (ref 4.22–5.81)
RDW: 13.6 % (ref 11.5–15.5)
WBC: 6.2 10*3/uL (ref 4.0–10.5)
nRBC: 0 % (ref 0.0–0.2)

## 2020-09-16 LAB — GLUCOSE, CAPILLARY
Glucose-Capillary: 131 mg/dL — ABNORMAL HIGH (ref 70–99)
Glucose-Capillary: 169 mg/dL — ABNORMAL HIGH (ref 70–99)
Glucose-Capillary: 179 mg/dL — ABNORMAL HIGH (ref 70–99)
Glucose-Capillary: 185 mg/dL — ABNORMAL HIGH (ref 70–99)
Glucose-Capillary: 215 mg/dL — ABNORMAL HIGH (ref 70–99)
Glucose-Capillary: 244 mg/dL — ABNORMAL HIGH (ref 70–99)

## 2020-09-16 MED ORDER — SENNOSIDES-DOCUSATE SODIUM 8.6-50 MG PO TABS: 1.0000 | ORAL_TABLET | Freq: Every day | ORAL | Status: DC

## 2020-09-16 MED ORDER — SENNOSIDES-DOCUSATE SODIUM 8.6-50 MG PO TABS
1.0000 | ORAL_TABLET | Freq: Every day | ORAL | Status: DC
  Administered 2020-09-16 – 2020-09-19 (×4): 1 via ORAL
  Filled 2020-09-16 (×5): qty 1

## 2020-09-16 NOTE — Progress Notes (Signed)
Occupational Therapy Treatment Patient Details Name: Frank [name] MRN: [id number] DOB: 03-11-1941 Today's Date: 09/16/2020    History of present illness Patient is a 80 y/o male who presents with dizziness, confusion, right weakness and right facial droop. NIH:4. 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 Repeat Head CT 3/1- increased SAH in right perisylvian fissure and parenchymal hemorrhage in the infarct territory with small volume intraventricular extension. MRI pending. PMH includes TBI, SDH, CKD, OSA, neuropathy, HTN, DM, AV block s/p PPM.   OT comments  Pt making steady progress towards OT goals this session. Pt continues to present with impaired balance, decreased activity tolerance, L sided weakness and impaired coordination. Pt currently requires MOD A +2 for functional transfers, set- up for seated grooming tasks and MOD A for bed mobility. Some continued L inattention noted but able to track to L and attend to L side of environment with cues during session. Pt would continue to benefit from skilled occupational therapy while admitted and after d/c to address the below listed limitations in order to improve overall functional mobility and facilitate independence with BADL participation. DC plan remains appropriate, will follow acutely per POC.     Follow Up Recommendations  CIR    Equipment Recommendations  3 in 1 bedside commode;Wheelchair (measurements OT);Wheelchair cushion (measurements OT)    Recommendations for Other Services      Precautions / Restrictions Precautions Precautions: Fall Precaution Comments: watch o2 Restrictions Weight Bearing Restrictions: No       Mobility Bed Mobility Overal bed mobility: Needs Assistance Bed Mobility: Sit to Sidelying;Rolling Rolling: Min guard   Supine to sit: Mod assist   Sit to sidelying: Mod assist General bed mobility  comments: MOD A to elevate BLEs back to bed, min guard to roll onto pts back mostly for line mgmt and safety    Transfers Overall transfer level: Needs assistance Equipment used: 2 person hand held assist Transfers: Sit to/from [facility] Sit to Stand: Mod assist;+2 physical assistance Stand pivot transfers: Mod assist;+2 physical assistance       General transfer comment: MOD A +2 to power into standing from recliner cues for hand placement needed, MOD A +2 to pivot to EOB to pts L side needing assist to advance LLE during transfer    Balance Overall balance assessment: Needs assistance Sitting-balance support: Bilateral upper extremity supported;Feet supported Sitting balance-Leahy Scale: Fair Sitting balance - Comments: no lean noted today at  edge of bed with min guard assist for safety.   Standing balance support: Bilateral upper extremity supported;During functional activity Standing balance-Leahy Scale: Poor Standing balance comment: needs external support for balance                           ADL either performed or assessed with clinical judgement   ADL       Grooming: Wash/dry face;Sitting;Set up Grooming Details (indicate cue type and reason): from recliner                 Toilet Transfer: +2 for physical assistance;Moderate assistance;Stand-pivot             General ADL Comments: pt more lerthargic this session needing max multimodal cues to attend to session, pt completed simulated toilet transfer back to bed with MOD A +2, continued L inattention noted. pt with difficulty advancing LLE during transfer  Vision   Vision Assessment?: Yes Eye Alignment: Within Functional Limits Ocular Range of Motion: Within Functional Limits Alignment/Gaze Preference: Within Defined Limits Tracking/Visual Pursuits: Decreased smoothness of eye movement to LEFT superior field;Decreased smoothness of eye movement to LEFT inferior  field Visual Fields: Left visual field deficit Additional Comments: continued L inattention   Perception     Praxis      Cognition Arousal/Alertness: Lethargic Behavior During Therapy: WFL for tasks assessed/performed Overall Cognitive Status: Difficult to assess Area of Impairment: Attention;Memory;Following commands;Safety/judgement;Awareness;Problem solving                   Current Attention Level: Sustained Memory: Decreased short-term memory Following Commands: Follows one step commands with increased time Safety/Judgement: Decreased awareness of safety;Decreased awareness of deficits Awareness: Intellectual Problem Solving: Slow processing;Decreased initiation General Comments: pt able to state correct month and aware that his birthday is coming up, pt msotly lethargic during session needing increased time to process commands. pt aware he is here for CVA but does not recall education previous PT session        Exercises Other Exercises Other Exercises: pt demonstrating AROM of sh flexion, elbow flexion/ extension, wrist flexion/ extension and composite digit flexion/ extension; delayed coordination noted with opposition   Shoulder Instructions       General Comments pt on 1L [place] during session with SpO2 96% post activity    Pertinent Vitals/ Pain       Pain Assessment: No/denies pain  Home Living                                          Prior Functioning/Environment              Frequency  Min 2X/week        Progress Toward Goals  OT Goals(current goals can now be found in the care plan section)  Progress towards OT goals: Progressing toward goals  Acute Rehab OT Goals Patient Stated Goal: wants water OT Goal Formulation: With patient Potential to Achieve Goals: Good  Plan Discharge plan remains appropriate;Frequency remains appropriate    Co-evaluation                 AM-PAC OT "6 Clicks" Daily Activity      Outcome Measure   Help from another person eating meals?: A Little Help from another person taking care of personal grooming?: A Little Help from another person toileting, which includes using toliet, bedpan, or urinal?: A Lot Help from another person bathing (including washing, rinsing, drying)?: A Lot Help from another person to put on and taking off regular upper body clothing?: A Lot Help from another person to put on and taking off regular lower body clothing?: Total 6 Click Score: 13    End of Session Equipment Utilized During Treatment: Gait belt;Oxygen;Other (comment) (1L [employer])  OT Visit Diagnosis: Unsteadiness on feet (R26.81);Muscle weakness (generalized) (M62.81)   Activity Tolerance Patient tolerated treatment well   Patient Left in bed;with call bell/phone within reach;with bed alarm set   Nurse Communication Mobility status        Time: 6269-4854 OT Time Calculation (min): 24 min  Charges: OT General Charges $OT Visit: 1 Visit OT Treatments $Self Care/Home Management : 23-37 mins  Harley Alto., COTA/L Acute Rehabilitation Services [phone]    Precious Haws 09/16/2020, 2:04 PM

## 2020-09-16 NOTE — Progress Notes (Signed)
Nutrition Follow-up  DOCUMENTATION CODES:   Obesity unspecified  INTERVENTION:   -Initiate 48 hour calorie count -Continue nocturnal feedings via cortrak tube:  Osmolite 1.5 @ 70 ml/hr via x 12 hours  45 Prosource TF TID.    Tube feeding regimen provides 1380 kcal (73% of needs), 86 grams of protein, and 640 ml of H2O.   NUTRITION DIAGNOSIS:   Inadequate oral intake related to inability to eat,lethargy/confusion as evidenced by NPO status.  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  3/2 - Cortrak tube placed (tip in gastric), TF initiated 3/4- s/p MBSS- advanced to dysphagia 1 diet with thin liquids 3/6- transitioned to nocturnal feedings  Reviewed I/O's: +842 ml x 24 hours and +4.4 L since admission  UOP: 1.2 L x 24   Pt sleeping soundly at time of visit. He did not respond to voice. Calorie count initiated today. Per chart review, pt consumed 70% of lunch meal. No other meal completions documented.   Medications reviewed and include keppra and senokot.  Labs reviewed: CBGS: 131-179 (inpatient orders for glycemic control are 0-15 units insulin aspart every 4 hours, 2 units insulin aspart every 4 hours, and 5 units insulin glargine daily).   Diet Order:   Diet Order            DIET - DYS 1 Room service appropriate? Yes; Fluid consistency: Nectar Thick  Diet effective now                 EDUCATION NEEDS:   No education needs have been identified at this time  Skin:  Skin Assessment: Reviewed RN Assessment  Last BM:  Unknown  Height:   Ht Readings from Last 1  Encounters:  09/09/20 5\' 9"  (1.753 m)    Weight:   Wt Readings from Last 1 Encounters:  09/16/20 94.8 kg    Ideal Body Weight:  72.7 kg  BMI:  Body mass index is 30.86 kg/m.  Estimated Nutritional Needs:   Kcal:  1900-2100  Protein:  95-110 grams  Fluid:  >2 L    Loistine Chance, RD, LDN, [place] Registered Dietitian II Certified Diabetes Care and Education Specialist Please refer to [facility] for RD and/or RD on-call/weekend/after hours pager

## 2020-09-16 NOTE — Progress Notes (Signed)
Physical Therapy Treatment Patient Details Name: Frank [name] MRN: [id number] DOB: Mar 26, 1941 Today's Date: 09/16/2020    History of Present Illness Patient is a 80 y/o male who presents with dizziness, confusion, right weakness and right facial droop. NIH:4. 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 Repeat Head CT 3/1- increased SAH in right perisylvian fissure and parenchymal hemorrhage in the infarct territory with small volume intraventricular extension. MRI pending. PMH includes TBI, SDH, CKD, OSA, neuropathy, HTN, DM, AV block s/p PPM.    PT Comments    Today's skilled session continued to focus on mobility progression with no issues noted or reported by patient. Continues to need increased assistance of two for safety with mobility. Acute PT to continue during pt's hospital stay.    Follow Up Recommendations  CIR;Supervision for mobility/OOB;Supervision/Assistance - 24 hour     Equipment Recommendations  None recommended by PT;Wheelchair cushion (measurements PT);Wheelchair (measurements PT)    Recommendations for Other Services Rehab consult     Precautions / Restrictions Precautions Precautions: Fall Precaution Comments: watch o2 Restrictions Weight Bearing Restrictions: No    Mobility  Bed Mobility Overal bed mobility: Needs Assistance Bed Mobility: Rolling;Supine to Sit Rolling: Min assist   Supine to sit: Mod assist     General bed mobility comments: directional cues needed for rolling each way x2 with rail assist/HOB flat. then mod assist for coming up to sitting at edge of bed. mod assist with pads used to scoot closer to edge of bed.    Transfers Overall transfer level: Needs assistance Equipment used: 2 person hand held assist Transfers: Sit to/from [facility] Sit to Stand: Mod assist;+2 physical assistance Stand pivot transfers: Mod  assist;+2 physical assistance       General transfer comment: with bed elevated pt neded assist of 2 to stand with cues for tall posture/hip extension. then 2 person mod assist for step around transfer, assist for balance and left LE advancement needed. assist for controlled descent into recliner.   Modified Rankin (Stroke Patients Only) Modified Rankin (Stroke Patients Only) Pre-Morbid Rankin Score: Slight disability Modified Rankin: Moderately severe disability     Balance Overall balance assessment: Needs assistance Sitting-balance support: Bilateral upper extremity supported;Feet supported Sitting balance-Leahy Scale: Fair Sitting balance - Comments: no lean noted today at  edge of bed with min guard assist for safety.   Standing balance support: Bilateral upper extremity supported;During functional activity Standing balance-Leahy Scale: Poor Standing balance comment: needs external suppoet for balance                            Cognition Arousal/Alertness: Awake/alert Behavior During Therapy: Impulsive;WFL for tasks assessed/performed Overall Cognitive Status: Difficult to assess Area of Impairment: Following commands;Safety/judgement                   Current Attention Level: Sustained   Following Commands: Follows one step commands with increased time Safety/Judgement: Decreased awareness of safety;Decreased awareness of deficits   Problem Solving: Slow processing;Requires verbal cues;Requires tactile cues General Comments: following commands more consistently, but needing processing time.       Pertinent Vitals/Pain Pain Assessment: No/denies pain     PT Goals (current goals can now be found in the care plan section) Acute Rehab PT Goals Patient Stated Goal: wants water PT Goal Formulation: With patient/family Time For Goal Achievement: 09/24/20 Potential to Achieve  Goals: Good Progress towards PT goals: Progressing toward goals     Frequency    Min 4X/week      PT Plan Current plan remains appropriate    AM-PAC PT "6 Clicks" Mobility   Outcome Measure  Help needed turning from your back to your side while in a flat bed without using bedrails?: A Little Help needed moving from lying on your back to sitting on the side of a flat bed without using bedrails?: A Lot Help needed moving to and from a bed to a chair (including a wheelchair)?: A Lot Help needed standing up from a chair using your arms (e.g., wheelchair or bedside chair)?: A Lot Help needed to walk in hospital room?: A Lot Help needed climbing 3-5 steps with a railing? : Total 6 Click Score: 12    End of Session Equipment Utilized During Treatment: Gait belt Activity Tolerance: Patient tolerated treatment well;Patient limited by fatigue Patient left: in chair;with call bell/phone within reach;with chair alarm set Nurse Communication: Mobility status PT Visit Diagnosis: Hemiplegia and hemiparesis;Difficulty in walking, not elsewhere classified (R26.2);Unsteadiness on feet (R26.81) Hemiplegia - Right/Left: Left Hemiplegia - dominant/non-dominant: Non-dominant Hemiplegia - caused by: Cerebral infarction     Time: 3151-7616 PT Time Calculation (min) (ACUTE ONLY): 17 min  Charges:  $Therapeutic Activity: 8-22 mins                     Willow Ora, PTA, CLT Acute Rehab Services Office- [phone] 09/16/20, 11:35 AM   Willow Ora 09/16/2020, 11:34 AM

## 2020-09-16 NOTE — Progress Notes (Addendum)
STROKE TEAM PROGRESS NOTE   INTERVAL HISTORY  Wife at the bedside. No acute events overnight. Pt's appetite is increasing.  Ate breakfast this morning.  Continues nocturnal TF. Will obtain calorie count and d/c tube feeds once appropriate.  Na down to 142. Will d/c FWFs.  Patient constipated with moderately distended abdomen.  Will start senna today.       Vitals:   09/16/20 0254 09/16/20 0300 09/16/20 0834 09/16/20 1131  BP:  (!) 148/60 (!) 149/67 (!) 146/64  Pulse:  73 68 62  Resp:  18 18 18   Temp:  98 F (36.7 C) 98.6 F (37 C) 98.6 F (37 C)  TempSrc:  Oral Oral Oral  SpO2: 98% 99% 97% 98%  Weight:  94.8 kg    Height:       CBC:  Recent Labs  Lab 09/10/20 0606 09/11/20 0440 09/15/20 0150 09/16/20 0340  WBC 8.2   < > 6.3 6.2  NEUTROABS 6.7  --   --   --   HGB 9.6*   < > 9.7* 9.0*  HCT 28.9*   < > 31.6* 29.1*  MCV 82.6   < > 87.3 87.7  PLT 143*   < > 124* 129*   < > = values in this interval not displayed.   Basic Metabolic Panel:  Recent Labs  Lab 09/13/20 0625 09/13/20 1026 09/13/20 1627 09/13/20 2201 09/15/20 0150 09/16/20 0340  NA 152*   < > 155*   < > 151* 142  K 4.1  --   --   --  4.1 4.1  CL 117*  --   --   --  117* 109  CO2 25  --   --   --  26 25  GLUCOSE 238*  --   --   --  195* 211*  BUN 33*  --   --   --  34* 38*  CREATININE 1.46*  --   --   --  1.45* 1.35*  CALCIUM 9.0  --   --   --  8.5* 8.6*  MG 1.8  --  1.7  --   --   --   PHOS 3.4  --  2.9  --   --   --    < > = values in this interval not displayed.   Lipid Panel:  Recent Labs  Lab 09/10/20 0606 09/13/20 0625  CHOL 107  --   TRIG 210* 77  HDL 36*  --   CHOLHDL 3.0  --   VLDL 42*  --   LDLCALC 29  --    HgbA1c:  Recent Labs  Lab 09/10/20 0606  HGBA1C 7.8*   Urine Drug Screen: pending  Result Date 09/10/2020 Mri brain/ MRA head 1. Large acute/subacute left-sided infarct with parenchymal hemorrhage measuring over 4 cm. PH2 hemorrhage. 2. Element of acute right subarachnoid  hemorrhage. 3. Chronic bilateral subdural hematomas. 4. No significant midline shift. IMAGING past 24 hours CT HEAD WO CONTRAST  Result Date: 09/10/2020 IMPRESSION:  1. Increased subarachnoid hemorrhage/contrast around the right sylvian fissure. There is also some interval parenchymal hemorrhage in the infarct territory with small volume intraventricular extension. No hydrocephalus or midline shift.  2. Right perisylvian cortical infarct correlating with affected vessel and the perfusion maps.  3. Chronic subdural hematomas.   DG Abd Portable 1V  Result Date: 09/09/2020 Nasogastric tube not identified within the visualized distal esophagus and abdomen. Chest radiograph may be helpful to identify the location of a  malpositioned catheter.   Result Date: 09/09/2020 ECHOCARDIOGRAM REPORT   1. Left ventricular ejection fraction, by estimation, is 65 to 70%. The left ventricle has normal function. The left ventricle has no regional wall motion abnormalities. There is severe concentric left ventricular hypertrophy. Left ventricular diastolic  parameters are consistent with Grade I diastolic dysfunction (impaired relaxation). Elevated left atrial pressure.   2. Right ventricular systolic function is normal. The right ventricular size is mildly enlarged. There is normal pulmonary artery systolic pressure.   3. The mitral valve is normal in structure. No evidence of mitral valve regurgitation. Mild mitral stenosis. The mean mitral valve gradient is 3.0 mmHg. Moderate mitral annular calcification.   4. The aortic valve is normal in structure. There is severe calcifcation of the aortic valve. There is severe thickening of the aortic valve. Aortic valve regurgitation is not visualized. Mild aortic valve stenosis. Aortic valve mean gradient measures 9.0 mmHg.   5. The inferior vena cava is normal in size with greater than 50% respiratory variability, suggesting right atrial pressure of 3 mmHg.   CT ANGIO  Head/neck CT perfusion CODE STROKE Result Date: 09/09/2020  IMPRESSION:  1. Positive for emergent occlusion of a posterior Right MCA distal M2 branch at its bifurcation. Embolized calcification there is new since last year (series 12, image 14).  2. CT Perfusion detects a small infarct core of 13 mL with regional penumbra of 41 mL (28 mL mismatch).  3. Superimposed very bulky calcified plaque at the Right ICA origin with tandem HIGH-GRADE STENOSIS, including Radiographic String Sign (series 7, image 216).  4. 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  5. Bulky Left Carotid bifurcation and proximal left ICA calcified plaque without significant stenosis.  6. Up to moderate Right Vertebral Artery origin stenosis.    PHYSICAL EXAM Obese elderly Caucasian male not in distress. . Afebrile. Head is nontraumatic. Neck is supple without bruit.    Cardiac exam no murmur or gallop. Lungs are clear to auscultation. Distal pulses are well felt. Neurological Exam : Patient is awake, alert and interactive today.  Follows all simple commands. No aphasia, able to name and repeat. He has mild dysarthria and has right gaze preference but able to look to the left.  Visual field full but neglects left visual stimulus.  Facial symmetrical.  Tongue midline.  Motor system exam shows left hemiparesis with LUE drift but proximal 3+/5 and distal 3/5 with finger grip. RUE 4+/5. BLE proximal 4/5 and RLE distal DF/PF 5/5 and LLE distal DF/PF 4/5. Sensation symmetrical. FTN intact but slower on the left.   ASSESSMENT/PLAN Frank [name] is a 80 y.o. 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  Was found to have a right MCA M2 branch occlusion.  .   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  ASPECTS is 10.    CTA head cclusion of a posterior Right MCA distal M2 branch at its bifurcation.  CTA neck right ICA origin with tandem HIGH-GRADE STENOSIS  CT perfusion small infarct core of 13  mL with regional penumbra of 41 mL (28 mL mismatch).   MRI/MRA h: Large acute/subacute left-sided infarct with parenchymal hemorrhage measuring over 4 cm. PH2 hemorrhage. Element of acute right subarachnoid hemorrhage. Chronic bilateral subdural hematomas.  2D Echo EF 65-70%, severe LVH, Grade I diastolic dysfunction  LDL 29  HgbA1c 7.8  VTE prophylaxis - SCDs, Heparin 5000units q 8hr  No antithrombotic prior to admission, now on aspirin 81 mg daily and ticagrelor 45mg  daily (reduced secondary to PH2 hemorrhage)  Therapy recommendations:  CIR-> wife requested [facility] for inpatient rehab  Disposition:  Pending    Subarachnoid Hemorrhage  S/p Thrombectomy, stent placement distal M2, balloon angioplasty  CT head 3/1: Increased subarachnoid hemorrhage the right sylvian fissure.    CT head 3/3: Related hemorrhage is decreasing.  SBP goal changed to <160  Cerebral edema  Hypertonic Saline ->off  Allow gradual Na trending down  CT repeat 3/3 no visible progression  Na 154->150->150->151->142  Will d/c [facility]WF due to drop in Na level  Dysphagia  Improving, Likely due to stroke  Now on dys1 and nectar thick  nocturnal TF to encourage po intake  Calorie count today  Consider to remove cortrak if pt eats well  Seizure history  Previously on Keppra due to seizures but eventually weaned off  Keppra decreased 3/2 to 500mg  bid for prophylaxis due to somnolence  EEG (3/2) suggestive of cortical dysfunction in right hemisphere and moderate  diffuse encephalopathy.  Chronic b/l SDH  CT head and MRI brain showed b/l subacute to chronic SDH  Did not receive tPA  Now on DAPT with half dose of brilinta  Stable on repeat CTs  Acute respiratory failure with hypoxia, improving  Aspiration event  short course of cefepime for aspiration -> off   afebrile  aspiration precautions   SLP   CCM signed off  Hypertension  Home meds:  Amlodipine 10mg  daily, Cadura 2mg  at bedtime, Micardis 20mg  daily  Currently on Amlodipine 10mg  daily and Cardura 2mg  qHS  Clonodine patch added 3/2  SBP goal <160 in the setting of SAH, ICH . hydralazine, labetolol prn . Long-term BP goal normotensive  Hyperlipidemia  Home meds: atorvastatin 20 daily  LDL 29, goal < 70  Started on atorvastatin 10mg  daily - no high intensity given low LDL  Continue statin at discharge  Diabetes type II Uncontrolled  Home meds:  Glipizide 10mg  daily, glargine 10units daily  HgbA1c 7.8, goal < 7.0  CBGs  SSI  lantus 5 units qHS, Novolog 2units q 4 hours   Appreciate diabetes coordinator recommendations  CKD IIIb  Likely due to contrast load for imaging/IR and dehydration  Creatinine level 1.98 -> 1.85->1.47->1.46->1.45->1.35  On TF  BMP daily  Other Stroke Risk Factors  Advanced Age >/= 74   Obesity, Body mass index is 30.86 kg/m., BMI >/= 30 associated with increased stroke risk, recommend weight loss, diet and exercise as appropriate   Coronary artery disease AV block, Mobitz 2, pacer  Obstructive sleep apnea, unclear if he is on CPAP at home  Other Active Problems  Anxiety/depression: prozac  GERD: protonix  Anemia: iron deficient and B12 deficient  Constipation: add senna  Hospital day #7   Louretta Parma, ACNP-BC Stroke Neurology 09/16/2020 1:37 PM   Attending Neurohospitalist Addendum Patient seen and examined with APP/Resident. Agree with the history and physical as documented above. Agree  with the plan as documented, which I helped formulate. I have independently reviewed the chart, obtained history, review of systems and  examined the patient.I have personally reviewed pertinent head/neck/spine imaging (CT/MRI). Please feel free to call with any questions. --- Amie Portland, MD [facility] Pager: [phone]  If 7pm to 7am, please call on call as listed on AMION.     To contact Stroke Continuity provider, please refer to [email or web address] After hours, contact General Neurology

## 2020-09-16 NOTE — Plan of Care (Signed)

## 2020-09-16 NOTE — TOC Progression Note (Signed)
Transition of Care [facility]) - Progression Note    Patient Details  Name: Frank [name] MRN: [id number] Date of Birth: Jul 15, 1940  Transition of Care [facility]) CM/SW [place], [place] Phone Number: 09/16/2020, 12:43 PM  Clinical Narrative:   CSW received call from patient's spouse earlier today that she would like [facility] for inpatient rehab. CSW contacted [facility] and sent referral over, they are reviewing. [facility] liaison will come and meet with patient this afternoon. CSW to continue to follow.    Expected Discharge Plan: IP Rehab Facility Barriers to Discharge: Continued Medical Work up,Insurance Authorization  Expected Discharge Plan and Services Expected Discharge Plan: [place]     Post Acute Care Choice: IP Rehab Living arrangements for the past 2 months: Single Family Home                                       Social Determinants of Health (SDOH) Interventions    Readmission Risk Interventions No flowsheet data found.

## 2020-09-16 NOTE — Care Management Important Message (Signed)
Important Message  Patient Details  Name: Frank [name] MRN: [id number] Date of Birth: [birth year]   Medicare Important Message Given:  Yes     Orbie Pyo 09/16/2020, 3:36 PM

## 2020-09-17 ENCOUNTER — Other Ambulatory Visit: Payer: Self-pay | Admitting: Internal Medicine

## 2020-09-17 DIAGNOSIS — I639 Cerebral infarction, unspecified: Secondary | ICD-10-CM | POA: Diagnosis not present

## 2020-09-17 LAB — SARS CORONAVIRUS 2 (TAT 6-24 HRS): SARS Coronavirus 2: NEGATIVE

## 2020-09-17 LAB — GLUCOSE, CAPILLARY
Glucose-Capillary: 135 mg/dL — ABNORMAL HIGH (ref 70–99)
Glucose-Capillary: 157 mg/dL — ABNORMAL HIGH (ref 70–99)
Glucose-Capillary: 171 mg/dL — ABNORMAL HIGH (ref 70–99)
Glucose-Capillary: 187 mg/dL — ABNORMAL HIGH (ref 70–99)
Glucose-Capillary: 219 mg/dL — ABNORMAL HIGH (ref 70–99)
Glucose-Capillary: 223 mg/dL — ABNORMAL HIGH (ref 70–99)
Glucose-Capillary: 226 mg/dL — ABNORMAL HIGH (ref 70–99)

## 2020-09-17 LAB — CBC
HCT: 28.3 % — ABNORMAL LOW (ref 39.0–52.0)
Hemoglobin: 8.6 g/dL — ABNORMAL LOW (ref 13.0–17.0)
MCH: 26 pg (ref 26.0–34.0)
MCHC: 30.4 g/dL (ref 30.0–36.0)
MCV: 85.5 fL (ref 80.0–100.0)
Platelets: 122 10*3/uL — ABNORMAL LOW (ref 150–400)
RBC: 3.31 MIL/uL — ABNORMAL LOW (ref 4.22–5.81)
RDW: 13.2 % (ref 11.5–15.5)
WBC: 6.6 10*3/uL (ref 4.0–10.5)
nRBC: 0 % (ref 0.0–0.2)

## 2020-09-17 LAB — BASIC METABOLIC PANEL
Anion gap: 10 (ref 5–15)
BUN: 38 mg/dL — ABNORMAL HIGH (ref 8–23)
CO2: 27 mmol/L (ref 22–32)
Calcium: 8.7 mg/dL — ABNORMAL LOW (ref 8.9–10.3)
Chloride: 100 mmol/L (ref 98–111)
Creatinine, Ser: 1.31 mg/dL — ABNORMAL HIGH (ref 0.61–1.24)
GFR, Estimated: 55 mL/min — ABNORMAL LOW (ref >60)
Glucose, Bld: 233 mg/dL — ABNORMAL HIGH (ref 70–99)
Potassium: 4 mmol/L (ref 3.5–5.1)
Sodium: 137 mmol/L (ref 135–145)

## 2020-09-17 NOTE — Evaluation (Signed)
Speech Language Pathology Evaluation Patient Details Name: Frank [name] MRN: [id number] DOB: 1941/02/22 Today's Date: 09/17/2020 Time: 1194-1740 SLP Time Calculation (min) (ACUTE ONLY): 18 min  Problem List:  Patient Active Problem List   Diagnosis Date Noted  . Stroke (cerebrum) ([place]) 09/09/2020  . 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 Chronic renal disease, stage 3, moderately decreased glomerular filtration rate (GFR) between 30-59 mL/min/1.73 square meter ([place]) 11/21/2018  . GERD without esophagitis 07/22/2017  . Depression   . Type 2 diabetes mellitus with peripheral neuropathy ([name])   . OAB (overactive bladder) 11/26/2014  . Routine general medical examination at a health care facility 04/06/2014  . Vitamin D deficiency 07/23/2010  . Obstructive sleep apnea 09/13/2008  . Hyperlipidemia with target LDL less than 100 07/27/2007  . BPH (benign prostatic hyperplasia) 06/21/2007  . 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 B12 deficiency anemia 04/15/2006  . Essential hypertension 04/15/2006  . Allergic rhinitis 04/15/2006   Past Medical History:  Past Medical History:  Diagnosis Date  . Allergy    Rhinitis  . Anemia    NOS iron deficient and B12 deficient  . Arthritis   . 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 Diabetes mellitus    Type 2  . GERD (gastroesophageal reflux disease)   . Hyperlipidemia   . Hypertension   . 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 OSA (obstructive sleep apnea)    cpap  . TBI (traumatic brain injury) [facility])    Past Surgical History:  Past Surgical History:  Procedure Laterality Date  . APPENDECTOMY    . arm fracture Left 2011   with hardware  . BURR HOLE Bilateral 08/04/2017   Procedure: Haskell Flirt;  Surgeon: [name], MD;  Location: [place];  Service: Neurosurgery;  Laterality:  Bilateral;  . COLONOSCOPY    . CRANIOTOMY Left 12/01/2017   Procedure: Trudee Kuster Holes CRANIOTOMY HEMATOMA EVACUATION SUBDURAL;  Surgeon: [name], MD;  Location: [place];  Service: Neurosurgery;  Laterality: Left;  . ESOPHAGOGASTRODUODENOSCOPY  2006   gastritis  . IR PERCUTANEOUS ART THROMBECTOMY/INFUSION INTRACRANIAL INC DIAG ANGIO  09/09/2020  . PACEMAKER IMPLANT N/A 05/14/2020   Procedure: PACEMAKER IMPLANT;  Surgeon: [name], MD;  Location: [place] CV [facility];  Service: Cardiovascular;  Laterality: N/A;  . RADIOLOGY WITH ANESTHESIA N/A 09/09/2020   Procedure: IR WITH ANESTHESIA;  Surgeon: Radiologist, Medication, MD;  Location: [place];  Service: Radiology;  Laterality: N/A;  . ROTATOR CUFF REPAIR    . SEPTOPLASTY  1959   Deviated Septum  . 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 NIH:4. 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 Repeat Head CT 3/1- increased SAH in right perisylvian fissure and parenchymal hemorrhage in the infarct territory with small volume intraventricular extension. MRI pending. PMH includes TBI, SDH, CKD, OSA, neuropathy, HTN, DM, AV block s/p PPM.   Assessment / Plan / Recommendation Clinical Impression  Speech language evaluation primarily consisted of caregiver interview with spouse. Pt very lethargic, difficult to arouse (note pt was lethargic earlier, though recently finished physical therapy session likely increasing fatigue). Spouse reports pt had some cognitive deficits following subdural hematoma previoulsy though was able  to complete basic ADLs. Family very supportive and pt had assistance from caretaker 5 days a week. Current level of function includes global cognitive deficits, reduced attention and decreased problem solving. Pt spouse notes new onset of slurred  speech, dysarthria noted. No aphasia evidenced overtly however limited expressive output this date due to lethargy. SLP to continue to follow for cognitive linguistic recovery and dysphagia.    SLP Assessment  SLP Recommendation/Assessment: Patient needs continued Speech Lanaguage Pathology Services SLP Visit Diagnosis: Dysarthria and anarthria (R47.1);Attention and concentration deficit;Cognitive communication deficit (R41.841)    Follow Up Recommendations  Inpatient Rehab    Frequency and Duration min 2x/week  2 weeks      SLP Evaluation Cognition  Overall Cognitive Status: Impaired/Different from baseline Arousal/Alertness: Lethargic Orientation Level: Oriented to person;Oriented to place;Disoriented to time Attention: Focused;Sustained Focused Attention: Impaired Focused Attention Impairment: Verbal basic;Functional basic Memory: Impaired Awareness: Impaired Problem Solving: Impaired Safety/Judgment: Impaired       Comprehension  Auditory Comprehension Overall Auditory Comprehension: Impaired    Expression Expression Primary Mode of Expression: Verbal Verbal Expression Overall Verbal Expression: Appears within functional limits for tasks assessed Written Expression Dominant Hand: Right   Oral / Motor  Oral Motor/Sensory Function Overall Oral Motor/Sensory Function: Within functional limits Motor Speech Overall Motor Speech: Impaired Respiration: Impaired Level of Impairment: Phrase Articulation: Impaired Effective Techniques: Slow rate;Increased vocal intensity;Pause   GO                    Hayden Rasmussen MA, CCC-SLP Acute Rehabilitation Services   09/17/2020, 4:52 PM

## 2020-09-17 NOTE — Progress Notes (Signed)
Patient has home unit cpap at beside. Patient has ng tube at this time and has nasal pillows. Unable to place on cpap at this time.

## 2020-09-17 NOTE — Progress Notes (Addendum)
STROKE TEAM PROGRESS NOTE   INTERVAL HISTORY  No acute events overnight. Pt's appetite is increasing.  Ate approximately 75% of his meals yesterda.  Will d/c cortrak and nocturnal TF. Abdomen less distended this morning.  Vital signs stable, afebrile.  Neuro exam unchanged.             Vitals:   09/16/20 2316 09/17/20 0011 09/17/20 0318 09/17/20 0853  BP: (!) 157/68 (!) 145/66 (!) 149/73 (!) 160/69  Pulse:  82 81 72  Resp:  20 19 18   Temp:  (!) 97.5 F (36.4 C) 98.4 F (36.9 C) 98.5 F (36.9 C)  TempSrc:  Oral Axillary Oral  SpO2:  95% 95% 93%  Weight:      Height:       CBC:  Last Labs       Recent Labs  Lab 09/16/20 0340 09/17/20 0349  WBC 6.2 6.6  HGB 9.0* 8.6*  HCT 29.1* 28.3*  MCV 87.7 85.5  PLT 129* 122*     Basic Metabolic Panel:  Last Labs           Recent Labs  Lab 09/13/20 0625 09/13/20 1026 09/13/20 1627 09/13/20 2201 09/16/20 0340 09/17/20 0349  NA 152*   < > 155*   < > 142 137  K 4.1  --   --    < > 4.1 4.0  CL 117*  --   --    < > 109 100  CO2 25  --   --    < > 25 27  GLUCOSE 238*  --   --    < > 211* 233*  BUN 33*  --   --    < > 38* 38*  CREATININE 1.46*  --   --    < > 1.35* 1.31*  CALCIUM 9.0  --   --    < > 8.6* 8.7*  MG 1.8  --  1.7  --   --   --   PHOS 3.4  --  2.9  --   --   --    < > = values in this interval not displayed.     Lipid Panel:  Last Labs      Recent Labs  Lab 09/13/20 0625  TRIG 77     HgbA1c:  Last Labs   No results for input(s): HGBA1C in the last 168 hours.   Urine Drug Screen: pending  Result Date 09/10/2020 Mri brain/ MRA head 1. Large acute/subacute left-sided infarct with parenchymal hemorrhage measuring over 4 cm. PH2 hemorrhage. 2. Element of acute right subarachnoid hemorrhage. 3. Chronic bilateral subdural hematomas. 4. No significant midline shift. IMAGING past 24 hours CT HEAD WO CONTRAST  Result Date: 09/10/2020 IMPRESSION:  1. Increased subarachnoid  hemorrhage/contrast around the right sylvian fissure. There is also some interval parenchymal hemorrhage in the infarct territory with small volume intraventricular extension. No hydrocephalus or midline shift.  2. Right perisylvian cortical infarct correlating with affected vessel and the perfusion maps.  3. Chronic subdural hematomas.   DG Abd Portable 1V  Result Date: 09/09/2020 Nasogastric tube not identified within the visualized distal esophagus and abdomen. Chest radiograph may be helpful to identify the location of a malpositioned catheter.   Result Date: 09/09/2020 ECHOCARDIOGRAM REPORT   1. Left ventricular ejection fraction, by estimation, is 65 to 70%. The left ventricle has normal function. The left ventricle has no regional wall motion abnormalities. There is severe concentric left ventricular hypertrophy. Left ventricular diastolic  parameters are consistent with Grade I diastolic dysfunction (impaired relaxation). Elevated left atrial pressure.   2. Right ventricular systolic function is normal. The right ventricular size is mildly enlarged. There is normal pulmonary artery systolic pressure.   3. The mitral valve is normal in structure. No evidence of mitral valve regurgitation. Mild mitral stenosis. The mean mitral valve gradient is 3.0 mmHg. Moderate mitral annular calcification.   4. The aortic valve is normal in structure. There is severe calcifcation of the aortic valve. There is severe thickening of the aortic valve. Aortic valve regurgitation is not visualized. Mild aortic valve stenosis. Aortic valve mean gradient measures 9.0 mmHg.   5. The inferior vena cava is normal in size with greater than 50% respiratory variability, suggesting right atrial pressure of 3 mmHg.   CT ANGIO Head/neck CT perfusion CODE STROKE Result Date: 09/09/2020  IMPRESSION:  1. Positive for emergent occlusion of a posterior Right MCA distal M2 branch at its bifurcation. Embolized calcification  there is new since last year (series 12, image 14).  2. CT Perfusion detects a small infarct core of 13 mL with regional penumbra of 41 mL (28 mL mismatch).  3. Superimposed very bulky calcified plaque at the Right ICA origin with tandem HIGH-GRADE STENOSIS, including Radiographic String Sign (series 7, image 216).  4. 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  5. Bulky Left Carotid bifurcation and proximal left ICA calcified plaque without significant stenosis.  6. Up to moderate Right Vertebral Artery origin stenosis.    PHYSICAL EXAM Obese elderly Caucasian male not in distress. . Afebrile. Head is nontraumatic. Neck is supple without bruit.    Cardiac exam no murmur or gallop. Lungs are clear to auscultation. Distal pulses are well felt. Neurological Exam : Patient is awake, alert and interactive today.  Follows all simple commands. No aphasia, able to name and repeat. He has mild dysarthria and has right gaze preference but able to look to the left.  Visual field full but neglects left visual stimulus.  Facial symmetrical.  Tongue midline.  Motor system exam shows LUE drift but proximal 3+/5 and distal 3/5 with finger grip. RUE 4+/5. BLE proximal 4/5 and RLE distal DF/PF 5/5 and LLE distal DF/PF 4/5. Sensation symmetrical. FTN intact but slower on the left.   ASSESSMENT/PLAN Mr. Abiel Antrim is a 80 y.o. 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  Was found to have a right MCA M2 branch occlusion.  .   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 ASPECTS is 10.    CTA head cclusion of a posterior Right MCA distal M2 branch at its bifurcation.  CTA neck right ICA origin with tandem HIGH-GRADE STENOSIS  CT perfusion small infarct core of 13 mL with regional penumbra of 41 mL (28 mL mismatch).   MRI/MRA h: Large acute/subacute left-sided infarct with parenchymal hemorrhage measuring over 4 cm. PH2 hemorrhage. Element of acute right subarachnoid hemorrhage. Chronic bilateral subdural hematomas.  2D Echo EF 65-70%, severe LVH, Grade I diastolic dysfunction  LDL 29  HgbA1c 7.8  VTE prophylaxis - SCDs, Heparin 5000units q 8hr  No antithrombotic prior to admission, now on aspirin 81 mg daily and ticagrelor 45mg  daily (reduced secondary to [facility] hemorrhage)  Therapy recommendations:  CIR-> wife requested [facility] for inpatient rehab  Disposition: Accepted to facility of his choice for tomorrow-we will prioritize discharge in the a.m.  Subarachnoid Hemorrhage  S/p Thrombectomy, stent placement distal M2, balloon angioplasty  CT head 3/1: Increased subarachnoid hemorrhage the right sylvian fissure.    CT head 3/3: Related hemorrhage is decreasing.  SBP goal changed to <160  Cerebral edema  Hypertonic Saline ->off  Allow gradual Na trending down  CT repeat 3/3 no visible progression  Na 154->150->150->151->142->137  Dysphagia  Improving, Likely due to stroke  Now on dys1 and nectar thick  Calorie count yesterday.  Patient eating approximately 75% of his meals.  remove cortrak today  D/C nocturnal TF to encourage po intake  Seizure history  Previously on Keppra due to seizures but eventually weaned off  Keppra decreased 3/2 to 500mg  bid for prophylaxis due to somnolence  EEG (3/2) suggestive of cortical dysfunction in right hemisphere and moderate diffuse encephalopathy.  Chronic b/l SDH  CT head and MRI brain showed b/l subacute to chronic SDH  Did not receive  tPA  Now on DAPT with half dose of brilinta  Stable on repeat CTs  Acute respiratory failure with hypoxia, improving Aspiration event  short course of cefepime for aspiration -> switched to Augmentin, 5 day course, last day today  afebrile  aspiration precautions   SLP   CCM signed off  Hypertension  Home meds:  Amlodipine 10mg  daily, Cadura 2mg  at bedtime, Micardis 20mg  daily  Currently on Amlodipine 10mg  daily and Cardura 2mg  qHS  Clonodine patch added 3/2  SBP goal <160 in the setting of SAH, ICH  hydralazine, labetolol prn  Long-term BP goal normotensive  Hyperlipidemia  Home meds: atorvastatin 20 daily  LDL 29, goal < 70  Started on atorvastatin 10mg  daily - no high intensity given low LDL  Continue statin at discharge  Diabetes type II Uncontrolled  Home meds:  Glipizide 10mg  daily, glargine 10units daily  HgbA1c 7.8, goal < 7.0  CBGs  SSI, glucose range 135-233  lantus 5 units qHS, Novolog 2units q 4 hours   consulted diabetes coordinator for new recommendations  CKD IIIb  Likely due to contrast load for imaging/IR and dehydration  Creatinine level 1.98 -> 1.85->1.47->1.46->1.45->1.35->1.31  On TF  BMP daily  Other Stroke Risk Factors  Advanced Age >/= 32   Obesity, Body mass index is 30.86 kg/m., BMI >/= 30 associated with increased stroke risk, recommend weight loss, diet and exercise as appropriate   Coronary artery disease AV block, Mobitz 2, pacer  Obstructive sleep apnea, CPAP at home  Other Active Problems  Anxiety/depression: prozac  GERD: protonix  Anemia: iron deficient and B12 deficient  Constipation: senna  OSA: ok to use CPAP from home  Hospital day #8   Lissy Olivencia-Simmons, ACNP-BC Stroke Neurology 09/17/2020 8:56 AM If 7pm to 7am, please call on call as listed on AMION.  Attending Neurohospitalist Addendum Patient seen and examined with APP/Resident. Agree with the history and  physical as documented above. Agree with the plan as documented, which I helped formulate. I have independently reviewed the chart, obtained history, review of systems and examined the patient.I have personally reviewed pertinent head/neck/spine imaging (CT/MRI). Please feel free to call with any questions.   -- Amie Portland, MD  Stroke Neurology Pager: [phone]

## 2020-09-17 NOTE — Progress Notes (Signed)
Inpatient Diabetes Program Recommendations  AACE/ADA: New Consensus Statement on Inpatient Glycemic Control (2015)  Target Ranges:  Prepandial:   less than 140 mg/dL      Peak postprandial:   less than 180 mg/dL (1-2 hours)      Critically ill patients:  140 - 180 mg/dL   Lab Results  Component Value Date   GLUCAP 219 (H) 09/17/2020   HGBA1C 7.8 (H) 09/10/2020    Review of Glycemic Control  Diabetes history: DM2 Outpatient Diabetes medications: Toujeo 10 units QD, glipizide 10 mg QD Current orders for Inpatient glycemic control: Lantus 5 units QHS, Novolog 0-15 units Q4H + 2 units Q4H  HgbA1C - 7.8%  Inpatient Diabetes Program Recommendations:     Increase Lantus to 8 units QHS Increase Novolog to 4 units Q4H for TF coverage  Will follow glucose trends daily.  Thank you. Lorenda Peck, RD, LDN, CDE Inpatient Diabetes Coordinator [phone]

## 2020-09-17 NOTE — Progress Notes (Signed)
Physical Therapy Treatment Patient Details Name: Frank [name] MRN: [id number] DOB: 12-16-1940 Today's Date: 09/17/2020    History of Present Illness Patient is a 80 y/o male who presents with dizziness, confusion, right weakness and right facial droop. NIH:4. Admited and found to have right MCA M2 branch occlusion s/p revascularization with placement of stent and Right ICA balloon angioplasty 09/09/20. Repeat Head CT 3/1- increased SAH in right perisylvian fissure and parenchymal hemorrhage in the infarct territory with small volume intraventricular extension. MRI pending. PMH includes TBI, SDH, CKD, OSA, neuropathy, HTN, DM, AV block s/p PPM.    PT Comments    Pt received in bed, cooperative and pleasant. Required max Ax2 for supine to sit towards L side, but min guard once in sitting. Session focused on weight shifting and midline orientation sitting on EOB. Pt with posterior and slight left lean, needed verbal cueing to bring trunk forward. Tactile cueing to help guide pt, especially for L elbow placement, to/from sitting to propped up on L elbow. Pt still has difficulty finding midline orientation in sitting but can with cueing. Follows commands consistently with increased time. Left in bed with all needs met, call bell within reach, bed alarm active, and wife present.    Follow Up Recommendations  CIR;Supervision for mobility/OOB;Supervision/Assistance - 24 hour     Equipment Recommendations  None recommended by PT;Wheelchair cushion (measurements PT);Wheelchair (measurements PT)    Recommendations for Other Services Rehab consult     Precautions / Restrictions Precautions Precautions: Fall Precaution Comments: watch o2 Restrictions Weight Bearing Restrictions: No    Mobility  Bed Mobility Overal bed mobility: Needs Assistance Bed Mobility: Supine to Sit     Supine to sit: +2 for physical assistance;Max assist Sit to supine: +2 for physical assistance;Max assist         Transfers                 General transfer comment: deferred  Ambulation/Gait             General Gait Details: deferred   Stairs             Wheelchair Mobility    Modified Rankin (Stroke Patients Only)       Balance Overall balance assessment: Needs assistance Sitting-balance support: Bilateral upper extremity supported;Feet supported Sitting balance-Leahy Scale: Fair   Postural control: Posterior lean   Standing balance-Leahy Scale: Poor Standing balance comment: needs external support for balance                            Cognition Arousal/Alertness: Awake/alert Behavior During Therapy: WFL for tasks assessed/performed;Flat affect Overall Cognitive Status: Difficult to assess Area of Impairment: Following commands;Safety/judgement                   Current Attention Level: Sustained Memory: Decreased short-term memory Following Commands: Follows one step commands with increased time Safety/Judgement: Decreased awareness of safety;Decreased awareness of deficits Awareness: Intellectual Problem Solving: Slow processing;Requires verbal cues;Requires tactile cues General Comments: following commands more consistently, but increased time to process and respond. answers questions appropriately with increased time      Exercises Other Exercises Other Exercises: Reaching to high five PT sitting on EOB (left, right, forward, alternating which hand is used), sitting to propper up on elbow x1 on R and x5 on L    General Comments General comments (skin integrity, edema, etc.): Pt received on 1L O2. VSS on 1L, SPO2  95% following session      Pertinent Vitals/Pain Pain Assessment: No/denies pain    Home Living                      Prior Function            PT Goals (current goals can now be found in the care plan section) Acute Rehab PT Goals Patient Stated Goal: wants water PT Goal Formulation: With  patient/family Time For Goal Achievement: 09/24/20 Potential to Achieve Goals: Good    Frequency    Min 4X/week      PT Plan Current plan remains appropriate    Co-evaluation              AM-PAC PT "6 Clicks" Mobility   Outcome Measure  Help needed turning from your back to your side while in a flat bed without using bedrails?: A Little Help needed moving from lying on your back to sitting on the side of a flat bed without using bedrails?: A Lot Help needed moving to and from a bed to a chair (including a wheelchair)?: A Lot Help needed standing up from a chair using your arms (e.g., wheelchair or bedside chair)?: A Lot Help needed to walk in hospital room?: A Lot Help needed climbing 3-5 steps with a railing? : Total 6 Click Score: 12    End of Session Equipment Utilized During Treatment: Gait belt Activity Tolerance: Patient tolerated treatment well Patient left: in bed;with call bell/phone within reach;with bed alarm set;with family/visitor present Nurse Communication: Mobility status PT Visit Diagnosis: Hemiplegia and hemiparesis;Difficulty in walking, not elsewhere classified (R26.2);Unsteadiness on feet (R26.81) Hemiplegia - Right/Left: Left Hemiplegia - dominant/non-dominant: Non-dominant Hemiplegia - caused by: Cerebral infarction     Time:  -     Charges:              Rosita Kea, SPT

## 2020-09-17 NOTE — Progress Notes (Signed)
  Speech Language Pathology Treatment: Dysphagia  Patient Details Name: Frank [name] MRN: [id number] DOB: 1940-08-01 Today's Date: 09/17/2020 Time: 0076-2263 SLP Time Calculation (min) (ACUTE ONLY): 20 min  Assessment / Plan / Recommendation Clinical Impression  Followed up for dysphagia intervention. Pt pleasant though required moderate cues for improving alertness as lethargy noted. Provided diligent oral care some xerostomia and diffuse residuals noted from breakfast meal. Trialed thin liquids (water) by cup and straw. Pt with discoordination with obtaining adequate labial seal with use of cup (suspected to be impacted by decreased cognitive function). Pt able to syphoon small sip via straw. Pt with mild oral holding and some swishing of bolus in oral cavity. Swallow initiation via palpation appeared delayed as well as reduced laryngeal elevation. Vocal quality remained clear and no overt s/sx of aspiration were exhibited with any POs. Pt with prolonged mastication of solid PO with oral residuals. Given mentation still remains overall reduced, most appropriate diet remains puree and nectar thick liquids. Pt okay for thin liquid water in between meals following diligent oral care as tolerated. SLP to follow up.      HPI HPI: Illness Patient is a 80 y/o male who presents with dizziness, confusion, right weakness and right facial droop. NIH:4. 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 Repeat Head CT 3/1- increased SAH in right perisylvian fissure and parenchymal hemorrhage in the infarct territory with small volume intraventricular extension. MRI pending. PMH includes TBI, SDH, CKD, OSA, neuropathy, HTN, DM, AV block s/p PPM      SLP Plan  Continue with current plan of care       Recommendations  Diet recommendations: Dysphagia 1 (puree);Nectar-thick liquid Liquids provided via:  Cup;Straw Medication Administration: Crushed with puree Compensations: Slow rate;Small sips/bites;Minimize environmental distractions;Follow solids with liquid Postural Changes and/or Swallow Maneuvers: Seated upright 90 degrees;Upright 30-60 min after meal                Oral Care Recommendations: Oral care BID;Oral care prior to ice chip/H20 Follow up Recommendations: Skilled Nursing facility SLP Visit Diagnosis: Dysphagia, oropharyngeal phase (R13.12) Plan: Continue with current plan of care       GO                Hayden Rasmussen MA, CCC-SLP Acute Rehabilitation Services   09/17/2020, 11:56 AM

## 2020-09-17 NOTE — Progress Notes (Signed)
Calorie Count Note  48 hour calorie count ordered.  Diet: dysphagia 1 with nectar thick liquids Supplements: Magic Cup (automatically provided by dysphagia 1 diet)  Day 1 Results Breakfast: 258 kcals, 5g protein Lunch: 328 kcals, 21g protein Dinner: 313.5 kcals, 18g protein Supplements: 348.5 kcals, 11g protein  Total intake: 1248 kcal (66% of minimum estimated needs)  55g protein (58% of minimum estimated needs)  Nutrition Dx: Inadequate oral intake related to inability to eat,lethargy/confusion as evidenced by NPO status.  Progressing; advanced to dysphagia 1 diet  Goal: Patient will meet greater than or equal to 90% of their needs  Progressing  Intervention:  -continue calorie count -- RD will follow-up with Day 2 results on 3/9 -Mighty Shake po TID, each supplement provides 330 kcal and 9 grams of protein -continue nocturnal TF via Cortrak: Osmolite 1.5 @ 70 ml/hr via x 12 hours 45 Prosource TF TID.    Tube feeding regimen provides 1380 kcal (73% of needs), 86 grams of protein, and 640 ml of H2O.   Larkin Ina, MS, RD, LDN RD pager number and weekend/on-call pager number located in [place].

## 2020-09-18 ENCOUNTER — Inpatient Hospital Stay (HOSPITAL_COMMUNITY): Payer: Medicare Other

## 2020-09-18 ENCOUNTER — Ambulatory Visit: Payer: Medicare Other

## 2020-09-18 ENCOUNTER — Other Ambulatory Visit: Payer: Self-pay | Admitting: Internal Medicine

## 2020-09-18 DIAGNOSIS — R0602 Shortness of breath: Secondary | ICD-10-CM

## 2020-09-18 DIAGNOSIS — I63512 Cerebral infarction due to unspecified occlusion or stenosis of left middle cerebral artery: Secondary | ICD-10-CM | POA: Diagnosis not present

## 2020-09-18 LAB — BLOOD GAS, ARTERIAL
Acid-Base Excess: 7.6 mmol/L — ABNORMAL HIGH (ref 0.0–2.0)
Bicarbonate: 32.1 mmol/L — ABNORMAL HIGH (ref 20.0–28.0)
FIO2: 32
O2 Saturation: 98.6 %
Patient temperature: 36.8
pCO2 arterial: 49.7 mmHg — ABNORMAL HIGH (ref 32.0–48.0)
pH, Arterial: 7.425 (ref 7.350–7.450)
pO2, Arterial: 126 mmHg — ABNORMAL HIGH (ref 83.0–108.0)

## 2020-09-18 LAB — CBC
HCT: 29.5 % — ABNORMAL LOW (ref 39.0–52.0)
Hemoglobin: 9.3 g/dL — ABNORMAL LOW (ref 13.0–17.0)
MCH: 26.4 pg (ref 26.0–34.0)
MCHC: 31.5 g/dL (ref 30.0–36.0)
MCV: 83.8 fL (ref 80.0–100.0)
Platelets: 129 10*3/uL — ABNORMAL LOW (ref 150–400)
RBC: 3.52 MIL/uL — ABNORMAL LOW (ref 4.22–5.81)
RDW: 13.1 % (ref 11.5–15.5)
WBC: 7.2 10*3/uL (ref 4.0–10.5)
nRBC: 0 % (ref 0.0–0.2)

## 2020-09-18 LAB — BASIC METABOLIC PANEL
Anion gap: 9 (ref 5–15)
BUN: 34 mg/dL — ABNORMAL HIGH (ref 8–23)
CO2: 29 mmol/L (ref 22–32)
Calcium: 8.8 mg/dL — ABNORMAL LOW (ref 8.9–10.3)
Chloride: 100 mmol/L (ref 98–111)
Creatinine, Ser: 1.42 mg/dL — ABNORMAL HIGH (ref 0.61–1.24)
GFR, Estimated: 50 mL/min — ABNORMAL LOW (ref >60)
Glucose, Bld: 130 mg/dL — ABNORMAL HIGH (ref 70–99)
Potassium: 4.3 mmol/L (ref 3.5–5.1)
Sodium: 138 mmol/L (ref 135–145)

## 2020-09-18 LAB — GLUCOSE, CAPILLARY
Glucose-Capillary: 126 mg/dL — ABNORMAL HIGH (ref 70–99)
Glucose-Capillary: 132 mg/dL — ABNORMAL HIGH (ref 70–99)
Glucose-Capillary: 158 mg/dL — ABNORMAL HIGH (ref 70–99)
Glucose-Capillary: 161 mg/dL — ABNORMAL HIGH (ref 70–99)
Glucose-Capillary: 101 mg/dL — ABNORMAL HIGH (ref 70–99)
Glucose-Capillary: 152 mg/dL — ABNORMAL HIGH (ref 70–99)
Glucose-Capillary: 165 mg/dL — ABNORMAL HIGH (ref 70–99)

## 2020-09-18 MED ORDER — ATORVASTATIN CALCIUM 10 MG PO TABS: 10.0000 mg | ORAL_TABLET | Freq: Every day | ORAL | Status: DC

## 2020-09-18 MED ORDER — CLONIDINE 0.2 MG/24HR TD PTWK: 0.2000 mg | MEDICATED_PATCH | TRANSDERMAL | Status: DC

## 2020-09-18 MED ORDER — TICAGRELOR 90 MG PO TABS: 45.0000 mg | ORAL_TABLET | Freq: Two times a day (BID) | ORAL | Status: DC

## 2020-09-18 MED ORDER — LORATADINE 10 MG PO TABS: 10.0000 mg | ORAL_TABLET | Freq: Every evening | ORAL | Status: DC

## 2020-09-18 MED ORDER — DEXTROMETHORPHAN POLISTIREX ER 30 MG/5ML PO SUER
30.0000 mg | Freq: Two times a day (BID) | ORAL | Status: DC | PRN
  Filled 2020-09-18: qty 5

## 2020-09-18 MED ORDER — LEVETIRACETAM 100 MG/ML PO SOLN
500.0000 mg | Freq: Two times a day (BID) | ORAL | Status: DC
Start: 2020-09-18 — End: 2020-09-20
  Administered 2020-09-18 – 2020-09-20 (×5): 500 mg via ORAL
  Filled 2020-09-18 (×5): qty 5

## 2020-09-18 MED ORDER — AMLODIPINE BESYLATE 10 MG PO TABS: 10.0000 mg | ORAL_TABLET | Freq: Every day | ORAL | Status: DC

## 2020-09-18 MED ORDER — FLUOXETINE HCL 20 MG PO CAPS: 20.0000 mg | ORAL_CAPSULE | Freq: Every day | ORAL | 3 refills | Status: DC

## 2020-09-18 MED ORDER — DOXAZOSIN MESYLATE 2 MG PO TABS: 2.0000 mg | ORAL_TABLET | Freq: Every day | ORAL | Status: DC

## 2020-09-18 MED ORDER — PANTOPRAZOLE SODIUM 40 MG PO PACK
40.0000 mg | PACK | Freq: Every day | ORAL | Status: DC
  Administered 2020-09-18 – 2020-09-19 (×2): 40 mg via ORAL
  Filled 2020-09-18 (×2): qty 20

## 2020-09-18 MED ORDER — AMLODIPINE BESYLATE 10 MG PO TABS
10.0000 mg | ORAL_TABLET | Freq: Every day | ORAL | Status: DC
Start: 2020-09-18 — End: 2020-09-20
  Administered 2020-09-18 – 2020-09-19 (×2): 10 mg via ORAL
  Filled 2020-09-18 (×2): qty 1

## 2020-09-18 MED ORDER — TICAGRELOR 90 MG PO TABS
45.0000 mg | ORAL_TABLET | Freq: Two times a day (BID) | ORAL | Status: DC
  Administered 2020-09-18 – 2020-09-20 (×5): 45 mg via ORAL
  Filled 2020-09-18 (×5): qty 1

## 2020-09-18 MED ORDER — FLUOXETINE HCL 20 MG PO CAPS
20.0000 mg | ORAL_CAPSULE | Freq: Every day | ORAL | Status: DC
  Administered 2020-09-18 – 2020-09-20 (×3): 20 mg via ORAL
  Filled 2020-09-18 (×3): qty 1

## 2020-09-18 MED ORDER — DOXAZOSIN MESYLATE 2 MG PO TABS
2.0000 mg | ORAL_TABLET | Freq: Every day | ORAL | Status: DC
Start: 2020-09-18 — End: 2020-09-20
  Administered 2020-09-18 – 2020-09-19 (×2): 2 mg via ORAL
  Filled 2020-09-18 (×2): qty 1

## 2020-09-18 MED ORDER — LEVETIRACETAM 100 MG/ML PO SOLN: 500.0000 mg | Freq: Two times a day (BID) | ORAL | 0 refills | Status: DC

## 2020-09-18 MED ORDER — ASPIRIN 81 MG PO CHEW: 81.0000 mg | CHEWABLE_TABLET | Freq: Every day | ORAL | Status: DC

## 2020-09-18 MED ORDER — FLUOXETINE HCL 20 MG PO CAPS: 20.0000 mg | ORAL_CAPSULE | Freq: Every day | ORAL | Status: DC

## 2020-09-18 MED ORDER — LORATADINE 10 MG PO TABS
10.0000 mg | ORAL_TABLET | Freq: Every evening | ORAL | Status: DC
Start: 2020-09-18 — End: 2020-09-20
  Administered 2020-09-18 – 2020-09-19 (×2): 10 mg via ORAL
  Filled 2020-09-18 (×2): qty 1

## 2020-09-18 MED ORDER — LEVETIRACETAM 100 MG/ML PO SOLN: 500.0000 mg | Freq: Two times a day (BID) | ORAL | 12 refills | Status: DC

## 2020-09-18 MED ORDER — ATORVASTATIN CALCIUM 10 MG PO TABS
10.0000 mg | ORAL_TABLET | Freq: Every day | ORAL | Status: DC
Start: 2020-09-18 — End: 2020-09-20
  Administered 2020-09-18 – 2020-09-20 (×3): 10 mg via ORAL
  Filled 2020-09-18 (×2): qty 1

## 2020-09-18 MED ORDER — INSULIN ASPART 100 UNIT/ML ~~LOC~~ SOLN
0.0000 [IU] | Freq: Three times a day (TID) | SUBCUTANEOUS | Status: DC
  Administered 2020-09-19: 2 [IU] via SUBCUTANEOUS
  Administered 2020-09-19 (×2): 5 [IU] via SUBCUTANEOUS
  Administered 2020-09-20: 2 [IU] via SUBCUTANEOUS

## 2020-09-18 NOTE — Discharge Summary (Addendum)
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  Now on dual antiplatelets due to the right carotid stent. -Aspiration pneumonia, acute respiratory failure-resolved -Essential hypertension -Hyperlipidemia -Diabetes uncontrolled with hyperglycemia -CKD 3B -Coronary artery disease -Obstructive sleep apnea-CPAP   Medications to be continued on Rehab Allergies as of 09/20/2020      Reactions   Lisinopril Cough   Invokana [canagliflozin] Other (See Comments)   Stopped by MD   Penicillins Other (See Comments)   Allergic as a child.  Patient does not remember reaction.  Has had cephalasporins without problems.   Sulfamethoxazole Other (See Comments)   Unknown reaction- from childhood      Medication List    STOP taking these medications   acetaminophen 325 MG tablet Commonly known as: TYLENOL   levocetirizine 5 MG tablet Commonly known as: XYZAL   mupirocin ointment 2 % Commonly known as: BACTROBAN   psyllium 58.6 % powder Commonly known as: METAMUCIL     TAKE these medications   amLODipine 10 MG tablet Commonly known as: NORVASC Take 1 tablet (10 mg total) by mouth daily. What changed: when to take this   aspirin 81 MG chewable tablet Chew 1 tablet (81 mg total) by mouth daily.   atorvastatin 10  MG tablet Commonly known as: LIPITOR Take 1 tablet (10 mg total) by mouth daily. What changed: medication strength   azelastine 0.1 % nasal spray Commonly known as: ASTELIN Place 1 spray into both nostrils 2 (two) times daily. Use in each nostril as directed   Carbonyl Iron 25 MG Tabs Take 25 mg by mouth daily with breakfast.   cloNIDine 0.2 mg/24hr patch Commonly known as: CATAPRES - Dosed in mg/24 hr Place 1 patch (0.2 mg total) onto the skin once a week.   doxazosin 2 MG tablet Commonly known as: CARDURA Take 1 tablet (2 mg total) by mouth at bedtime. What changed: medication strength   FLUoxetine 20 MG capsule Commonly known as: PROZAC Take 1 capsule (20 mg total) by mouth daily.   glipiZIDE 10 MG 24 hr tablet Commonly known as: GLUCOTROL XL Take 1 tablet (10 mg total) by mouth daily.   Gvoke HypoPen 2-Pack 1 MG/0.2ML Soaj Generic drug: Glucagon Inject 1 Act into the skin daily as needed. What changed:   how much to take  when to take this  reasons to take this   Insulin Pen Needle 32G X 6 MM Misc 1 Act by Does not apply route daily.   levETIRAcetam 100 MG/ML solution Commonly known as: KEPPRA Take 5 mLs (500 mg total) by mouth 2 (two) times daily.   loratadine 10 MG tablet Commonly known as: CLARITIN Take 1 tablet (10 mg total) by mouth every evening.   pantoprazole 40 MG tablet Commonly known as: PROTONIX TAKE 1 TABLET BY MOUTH  DAILY   PRESCRIPTION MEDICATION CPAP-  At bedtime   solifenacin 10 MG tablet Commonly known as: VESICARE TAKE 1 TABLET BY MOUTH  DAILY   telmisartan 20 MG tablet Commonly known as: MICARDIS TAKE 1 TABLET BY MOUTH  DAILY   ticagrelor 90 MG Tabs tablet Commonly known as: BRILINTA Take 0.5 tablets (45 mg total) by mouth 2 (two) times daily.   Toujeo Max SoloStar 300 UNIT/ML Solostar Pen Generic drug: insulin glargine (2 Unit Dial) Inject 10 Units into the skin daily.   triamcinolone 0.1 % Commonly known as:  KENALOG Apply 1 application topically daily as needed (to affected areas).       LABORATORY STUDIES CBC    Component Value Date/Time   WBC 6.2 09/19/2020 0413   RBC 3.52 (L) 09/19/2020 0413   HGB 9.4 (L) 09/19/2020 0413   HGB 10.2 (L) 12/12/2018 1431   HCT 29.4 (L) 09/19/2020 0413   PLT 122 (L) 09/19/2020 0413   PLT 160 12/12/2018 1431   MCV 83.5 09/19/2020 0413   MCH 26.7 09/19/2020 0413   MCHC 32.0 09/19/2020 0413   RDW 13.2 09/19/2020 0413   LYMPHSABS 0.7 09/10/2020 0606   MONOABS 0.8 09/10/2020 0606   EOSABS 0.0 09/10/2020 0606   BASOSABS 0.1 09/10/2020 0606   CMP    Component Value Date/Time   NA 137 09/19/2020 0413   NA 139 04/23/2020 0000   K 4.7 09/19/2020 0413   CL 99 09/19/2020 0413   CO2 32 09/19/2020 0413   GLUCOSE 157 (H) 09/19/2020 0413   BUN 34 (H) 09/19/2020 0413   BUN 27 (A) 04/23/2020 0000   CREATININE 1.44 (H) 09/19/2020 0413   CALCIUM 8.8 (L) 09/19/2020 0413   PROT 5.5 (L) 09/19/2020 0938   ALBUMIN 2.7 (L) 09/19/2020 0938   AST 52 (H) 09/19/2020 0938   ALT 78 (H) 09/19/2020 0938   ALKPHOS 75 09/19/2020 0938   BILITOT 0.6 09/19/2020 0938   GFRNONAA 49 (L) 09/19/2020 0413   GFRAA 30 (L) 12/08/2019 2250   COAGS Lab Results  Component Value Date   INR 1.0 09/09/2020   INR 0.98 08/04/2017   INR 0.94 06/23/2017   Lipid Panel    Component Value Date/Time   CHOL 107 09/10/2020 0606   TRIG 77 09/13/2020 0625   HDL 36 (L) 09/10/2020 0606   CHOLHDL 3.0 09/10/2020 0606   VLDL 42 (H) 09/10/2020 0606   LDLCALC 29 09/10/2020 0606   HgbA1C  Lab Results  Component Value Date   HGBA1C 7.8 (H) 09/10/2020   Urinalysis    Component Value Date/Time   COLORURINE YELLOW 08/21/2020 0945   APPEARANCEUR Sl Cloudy (A) 08/21/2020 0945   LABSPEC >=1.030 (A) 08/21/2020 0945   PHURINE 5.5 08/21/2020 0945   GLUCOSEU NEGATIVE 08/21/2020 0945   HGBUR TRACE-LYSED (A) 08/21/2020 0945   BILIRUBINUR NEGATIVE 08/21/2020 0945   BILIRUBINUR n 07/19/2013 1136    KETONESUR NEGATIVE 08/21/2020 0945   PROTEINUR NEGATIVE 12/08/2019 2250   UROBILINOGEN 0.2 08/21/2020 0945   NITRITE NEGATIVE 08/21/2020 0945   LEUKOCYTESUR NEGATIVE 08/21/2020 0945   Urine Drug Screen     Component Value Date/Time   LABOPIA NONE DETECTED 09/10/2020 1904   COCAINSCRNUR NONE DETECTED 09/10/2020 1904   LABBENZ NONE DETECTED 09/10/2020 1904   AMPHETMU NONE DETECTED 09/10/2020 1904   THCU NONE DETECTED 09/10/2020 1904   LABBARB NONE DETECTED 09/10/2020 1904    Alcohol Level    Component Value Date/Time   ETH <10 06/23/2017 1235     SIGNIFICANT DIAGNOSTIC STUDIES DG Chest  1 View  Result Date: 09/18/2020 CLINICAL DATA:  SOB EXAM: CHEST  1 VIEW COMPARISON:  09/12/2020 FINDINGS: Bibasilar atelectasis. No focal consolidation. No pleural effusion or pneumothorax. Heart and mediastinal contours are unremarkable. Dual lead pacemaker. No acute osseous abnormality. IMPRESSION: No active disease. Bibasilar atelectasis. Electronically Signed   By: Kathreen Devoid   On: 09/18/2020 13:54   CT HEAD WO CONTRAST  Result Date: 09/18/2020 CLINICAL DATA:  Stroke.  Mental status change EXAM: CT HEAD WITHOUT CONTRAST TECHNIQUE: Contiguous axial images were obtained from the base of the skull through the vertex without intravenous contrast. COMPARISON:  CT head 09/12/2020 FINDINGS: Brain: Large infarct right posterior MCA territory is unchanged in size. Hemorrhagic transformation within this infarct also unchanged. No new area of acute infarct Small right parietal subdural low-density fluid collection unchanged. Mixed density but predominantly low-density left subdural collection unchanged. Mild subarachnoid hemorrhage on the right is unchanged. Mild mass-effect on the right lateral ventricle without midline shift. Vascular: Negative for hyperdense vessel. Vascular stent in the posterior branch of the right middle cerebral artery unchanged. Skull: Left craniotomy. Right burr hole. No acute skeletal  abnormality Sinuses/Orbits: Bony thickening right maxillary sinus. Mild mucosal edema in the ethmoid sinuses. Air-fluid level left frontal sinus. Mastoid clear bilaterally. Other: None IMPRESSION: No change in right posterior MCA infarct with hemorrhagic transformation. No new area of hemorrhage. Right MCA branch stent unchanged. Bilateral subdural collections stable. Electronically Signed   By: Franchot Gallo M.D.   On: 09/18/2020 18:50   CT Head Wo Contrast  Result Date: 09/12/2020 CLINICAL DATA:  Change in mental status.  Follow-up EXAM: CT HEAD WITHOUT CONTRAST TECHNIQUE: Contiguous axial images were obtained from the base of the skull through the vertex without intravenous contrast. COMPARISON:  Two days ago FINDINGS: Brain: Decreased subarachnoid and parenchymal hemorrhage on the right. No progressive swelling at the large right MCA branch infarct. No hydrocephalus. Small bilateral subdural hematoma with scattered high-density areas likely from septation. Vascular: No hyperdense vessel or unexpected calcification. Skull: Postoperative calvarium. Sinuses/Orbits: Negative IMPRESSION: 1. Large acute right MCA branch infarct without visible progression. Related hemorrhage is decreasing. 2. Stable subdural hematomas without significant mass effect. Electronically Signed   By: Monte Fantasia M.D.   On: 09/12/2020 09:56   CT HEAD WO CONTRAST  Result Date: 09/10/2020 CLINICAL DATA:  Stroke post revascularization and stent placement EXAM: CT HEAD WITHOUT CONTRAST TECHNIQUE: Contiguous axial images were obtained from the base of the skull through the vertex without intravenous contrast. COMPARISON:  Earlier same day FINDINGS: Brain: Parenchymal and subarachnoid hemorrhage again identified in the right cerebral hemisphere particularly about the sylvian fissure. Extent of hyperdensity has decreased probably reflecting component of contrast staining. This is superimposed on hypoattenuation reflecting evolving  infarction with extent better seen on MRI. Small volume hemorrhage is present within the occipital horns. Bilateral subdural hematomas are again identified with small volume hyperdense hemorrhage. Stable partial effacement of the right lateral ventricle and minor leftward midline shift. No hydrocephalus. Vascular: Right MCA branch stent.  No new findings. Skull: Prior left craniotomy and right burr hole. Sinuses/Orbits: No acute finding. Other: None. IMPRESSION: Evolving right MCA territory infarction with hemorrhagic conversion. Extent is better seen on same day MRI. Parenchymal, sulcal subarachnoid, and intraventricular hemorrhage are present. Bilateral subdural hematomas again identified with small volume hyperdense hemorrhage. Stable mild mass effect. Electronically Signed   By: Macy Mis M.D.   On: 09/10/2020 19:12   CT HEAD WO CONTRAST  Result Date: 09/10/2020 CLINICAL  DATA:  Acute stroke suspected EXAM: CT HEAD WITHOUT CONTRAST TECHNIQUE: Contiguous axial images were obtained from the base of the skull through the vertex without intravenous contrast. COMPARISON:  Flat panel CT 09/09/2020 FINDINGS: Brain: Subarachnoid hemorrhage/contrast has increased/diffused along the right sylvian fissure and adjacent sulci. Visible cytotoxic edema in the right perisylvian cortex. There is a degree of parenchymal hemorrhage at the posterior caudate and deep white matter on the right, reaching the ependymoma with small volume intraventricular extension at the dependent lateral ventricles. No hydrocephalus. Bilateral chronic subdural hematoma with similar degree of wispy and nodular high-density posteriorly. Vascular: Right MCA branch stent. Skull: Prior burr holes on left-sided craniotomy. Sinuses/Orbits: No acute finding.  Bilateral cataract resection. Other: A call has been placed to the ordering provider. IMPRESSION: 1. Increased subarachnoid hemorrhage/contrast around the right sylvian fissure. There is also some  interval parenchymal hemorrhage in the infarct territory with small volume intraventricular extension. No hydrocephalus or midline shift. 2. Right perisylvian cortical infarct correlating with affected vessel and the perfusion maps. 3. Chronic subdural hematomas. Electronically Signed   By: Monte Fantasia M.D.   On: 09/10/2020 06:01   MR ANGIO HEAD WO CONTRAST  Result Date: 09/10/2020 CLINICAL DATA:  Acute onset of difficulty standing and walking. Right facial droop. Status post revascularization of right inferior M2 M3 occlusion. Status post balloon angioplasty of right internal carotid artery. Placement intracranial stent. EXAM: MRI HEAD WITHOUT CONTRAST TECHNIQUE: Multiplanar, multiecho pulse sequences of the brain and surrounding structures were obtained without intravenous contrast. COMPARISON:  CT head without contrast, CT perfusion head and CTA head 09/09/2020. CT head without contrast 09/10/2020. FINDINGS: Brain: The diffusion-weighted images demonstrate an acute/subacute infarct similar in size to the predicted area of ischemia on the CT perfusion study. Parenchymal hemorrhage is noted throughout the bulk of the infarct. The parenchymal hemorrhage measures over 4 cm. Element of subarachnoid hemorrhage is present as well. Diffuse cortical edema is present within the infarct territory. There is some mass effect with out significant midline shift. No acute left-sided infarct is present. Mild atrophy is present. Chronic bilateral subdural hematomas are again noted. The internal auditory canals are within normal limits. The brainstem and cerebellum are within normal limits. Vascular: Flow is present in the major intracranial arteries. Artifact from intracranial stent within inferior left M2 segment noted. Skull and upper cervical spine: Craniocervical junction is within normal limits. Slight anterolisthesis is present at C2-3. Marrow signal is normal. Sinuses/Orbits: Mild mucosal thickening is scattered  throughout the ethmoid air cells and frontal sinuses bilaterally. No significant mastoid effusion is present. No fluid levels are present. Bilateral lens replacements are noted. Globes and orbits are otherwise unremarkable. IMPRESSION: 1. Large acute/subacute left-sided infarct with parenchymal hemorrhage measuring over 4 cm. PH2 hemorrhage. 2. Element of acute right subarachnoid hemorrhage. 3. Chronic bilateral subdural hematomas. 4. No significant midline shift. Electronically Signed   By: San Morelle M.D.   On: 09/10/2020 12:38   MR BRAIN WO CONTRAST  Result Date: 09/10/2020 CLINICAL DATA:  Acute onset of difficulty standing and walking. Right facial droop. Status post revascularization of right inferior M2 M3 occlusion. Status post balloon angioplasty of right internal carotid artery. Placement intracranial stent. EXAM: MRI HEAD WITHOUT CONTRAST TECHNIQUE: Multiplanar, multiecho pulse sequences of the brain and surrounding structures were obtained without intravenous contrast. COMPARISON:  CT head without contrast, CT perfusion head and CTA head 09/09/2020. CT head without contrast 09/10/2020. FINDINGS: Brain: The diffusion-weighted images demonstrate an acute/subacute infarct similar in size to the  predicted area of ischemia on the CT perfusion study. Parenchymal hemorrhage is noted throughout the bulk of the infarct. The parenchymal hemorrhage measures over 4 cm. Element of subarachnoid hemorrhage is present as well. Diffuse cortical edema is present within the infarct territory. There is some mass effect with out significant midline shift. No acute left-sided infarct is present. Mild atrophy is present. Chronic bilateral subdural hematomas are again noted. The internal auditory canals are within normal limits. The brainstem and cerebellum are within normal limits. Vascular: Flow is present in the major intracranial arteries. Artifact from intracranial stent within inferior left M2 segment noted.  Skull and upper cervical spine: Craniocervical junction is within normal limits. Slight anterolisthesis is present at C2-3. Marrow signal is normal. Sinuses/Orbits: Mild mucosal thickening is scattered throughout the ethmoid air cells and frontal sinuses bilaterally. No significant mastoid effusion is present. No fluid levels are present. Bilateral lens replacements are noted. Globes and orbits are otherwise unremarkable. IMPRESSION: 1. Large acute/subacute left-sided infarct with parenchymal hemorrhage measuring over 4 cm. PH2 hemorrhage. 2. Element of acute right subarachnoid hemorrhage. 3. Chronic bilateral subdural hematomas. 4. No significant midline shift. Electronically Signed   By: San Morelle M.D.   On: 09/10/2020 12:38   CT CEREBRAL PERFUSION W CONTRAST  Result Date: 09/09/2020 CLINICAL DATA:  80 year old male code stroke. Neglect. Right parietal lobe implicated. History of unresolved subdural hematomas. EXAM: CT ANGIOGRAPHY HEAD AND NECK CT PERFUSION BRAIN TECHNIQUE: Multidetector CT imaging of the head and neck was performed using the standard protocol during bolus administration of intravenous contrast. Multiplanar CT image reconstructions and MIPs were obtained to evaluate the vascular anatomy. Carotid stenosis measurements (when applicable) are obtained utilizing NASCET criteria, using the distal internal carotid diameter as the denominator. Multiphase CT imaging of the brain was performed following IV bolus contrast injection. Subsequent parametric perfusion maps were calculated using RAPID software. CONTRAST:  133m OMNIPAQUE IOHEXOL 350 MG/ML SOLN COMPARISON:  Plain head CT 1105 hours today reported separately. Intracranial MRA 06/24/2017. FINDINGS: CT Brain Perfusion Findings: ASPECTS: 10 CBF (<30%) Volume: 169mPerfusion (Tmax>6.0s) volume: 4136m Hypoperfusion index 0.1 Mismatch Volume: 33m48mfarction Location:Posterior right MCA territory. CTA NECK Skeleton: Previous left superior  frontal craniotomy. Chronic bilateral calvarium burr holes. No acute osseous abnormality identified. Cervical spine degeneration. Upper chest: Partially visible left chest cardiac pacemaker. Otherwise negative. Other neck: Mild motion artifact. No acute findings identified in the neck. Aortic arch: 3 vessel arch configuration. Relatively mild Calcified aortic atherosclerosis. Right carotid system: Mild calcified plaque of the brachiocephalic artery without stenosis. Normal right CCA origin. Intermittent moderate calcified plaque in the right CCA proximal to the bifurcation, and at the bifurcation a radiographic string sign occurs at the ICA origin related to very bulky calcified plaque on series 5, image 108. Additional bulky plaque through the distal bulb, tandem stenosis on series 5, image 103. But the vessel remains patent. No additional stenosis to the skull base. Left carotid system: Mild left CCA origin but similar moderate left CCA and left carotid bifurcation through ICA bulb calcified plaque. On this side subsequent stenosis is less than 50 % with respect to the distal vessel. Vertebral arteries: Mild to moderate proximal right subclavian calcified plaque. Calcified plaque at the right vertebral artery origin with mild to moderate stenosis. Patent right vertebral then to the skull base. Mild proximal left subclavian and left vertebral artery origin calcified plaque without stenosis. Tortuous left V1 segment. Codominant left vertebral is otherwise normal to the skull base. CTA HEAD  Posterior circulation: Negative distal vertebral arteries and basilar. The left V4 is mildly dominant. Normal PICA origins. Patent SCA and PCA origins. Small posterior communicating arteries. Mild left PCA P1 irregularity. Bilateral PCA branches are within normal limits. Anterior circulation: Both ICA siphons are patent. On the left cavernous segment calcified plaque results in no stenosis. Normal left ophthalmic and posterior  communicating artery origins. On the right cavernous segment calcified plaque results in no significant stenosis. Patent carotid termini, MCA and ACA origins. Dominant left A1. 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 Otherwise the bilateral ACA branches are within normal limits. Left MCA M1 segment and bifurcation are patent. Left MCA branches appears table since 2018. Right MCA M1 and bifurcation remain patent. Dominant posterior right MCA M2 branch is occluded at the level of punctate calcification on series 12, image 14. This is at a bifurcation where the superior branch remains patent. But the parent vessel is occluded. This calcification is new compared to last year. Venous sinuses: Patent. Anatomic variants: Dominant left ACA A1. Review of the MIP images confirms the above findings IMPRESSION: 1. Positive for emergent occlusion of a posterior Right MCA distal M2 branch at its bifurcation. Embolized calcification there is new since last year (series 12, image 14). 2. CT Perfusion detects a small infarct core of 13 mL with regional penumbra of 41 mL (28 mL mismatch). 3. Superimposed very bulky calcified plaque at the Right ICA origin with tandem HIGH-GRADE STENOSIS, including Radiographic String Sign (series 7, image 216). 4. The above were reviewed in person with Dr. Roland Rack on 09/09/2020 at 1128 hours. 5. 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 6. Bulky Left Carotid bifurcation and proximal left ICA calcified plaque without significant stenosis. 7. Up to moderate Right Vertebral Artery origin stenosis. Electronically Signed   By: Genevie Ann M.D.   On: 09/09/2020 11:46   DG CHEST PORT 1 VIEW  Result Date: 09/12/2020 CLINICAL DATA:  Respiratory failure.  Hypoxia. EXAM: PORTABLE CHEST 1 VIEW COMPARISON:  Chest x-ray 05/15/2020. FINDINGS: Feeding tube noted with tip below left  hemidiaphragm. Cardiac pacer noted in stable position. Mitral annular calcification. Borderline cardiomegaly and pulmonary venous congestion. Low lung volumes with mild bibasilar atelectasis. Mild right base infiltrate cannot be excluded. Tiny bilateral pleural effusions cannot be excluded. No pneumothorax. Degenerative changes thoracic spine. Postsurgical changes both shoulders. IMPRESSION: 1.  Feeding tube noted with tip below left hemidiaphragm. 2. Cardiac pacer stable position. Borderline cardiomegaly pulmonary venous congestion. 3. Mild bibasilar atelectasis. Mild right base infiltrate cannot be excluded. Tiny bilateral pleural effusions cannot be excluded. Electronically Signed   By: Marcello Moores  Register   On: 09/12/2020 16:34   DG Abd Portable 1V  Result Date: 09/09/2020 CLINICAL DATA:  Nasogastric tube placement EXAM: PORTABLE ABDOMEN - 1 VIEW COMPARISON:  None. FINDINGS: No nasogastric tube is identified within the visualized abdomen. The right flank and pelvis are excluded from view. The visualized abdominal gas pattern is unremarkable. IMPRESSION: Nasogastric tube not identified within the visualized distal esophagus and abdomen. Chest radiograph may be helpful to identify the location of a malpositioned catheter. These results will be called to the ordering clinician or representative by the Radiologist Assistant, and communication documented in the PACS or [employer]. Electronically Signed   By: Fidela Salisbury MD   On: 09/09/2020 23:49   EEG adult  Result Date: 09/11/2020 Lora Havens, MD  09/11/2020  1:10 PM Patient Name: Ledger Heindl MRN: [id number] Epilepsy Attending: Lora Havens Referring Physician/Provider: Tollie Eth, NP Date: 09/11/2020 Duration: 24.37 mins Patient history: 80 year old male with history of seizures, presented with altered mental status. EEG to evaluate for seizures. Level of alertness: Awake,asleep AEDs during EEG study: Keppra Technical  aspects: This EEG study was done with scalp electrodes positioned according to the 10-20 International system of electrode placement. Electrical activity was acquired at a sampling rate of _0  and reviewed with a high frequency filter of _1  and a low frequency filter of _2 . EEG data were recorded continuously and digitally stored. Description: No clear posterior dominant rhythm was seen.  Sleep was characterized by vertex waves, sleep spindles (12 to 14 Hz), maximal frontocentral region.  EEG showed continuous 5 to 8 Hz theta-alpha activity in left hemisphere admixed with 15 to 18 Hz beta activity in left central temporal region consistent with breach artifact.  There is also 3 to 5 Hz theta-delta slowing in right hemisphere. Sharp transients were seen in right> left temporal region.  Hyperventilation and photic stimulation were not performed.   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  There is also evidence of breach artifact in left centro- temporal region consistent with prior craniotomy.  Additionally there is evidence of moderate diffuse encephalopathy, nonspecific etiology.  No seizures or definite epileptiform discharges were seen throughout the recording.  If suspicion for interictal activity remains a concern, a prolonged study can be considered. Lora Havens   ECHOCARDIOGRAM COMPLETE  Result Date: 09/09/2020    ECHOCARDIOGRAM REPORT   Patient Name:   TAYSHUN GAPPA [facility] Date of Exam: 09/09/2020 Medical Rec #:  [id number]             Height:       69.0 in Accession #:    [id number]            Weight:       218.3 lb Date of Birth:  October 21, 1940             BSA:          2.144 m Patient Age:    70 years              BP:           100/45 mmHg Patient Gender: M                     HR:           70  bpm. Exam Location:  Inpatient Procedure: 2D Echo, Cardiac Doppler, Color Doppler and Intracardiac            Opacification Agent Indications:    Stroke  History:        Patient has prior history of Echocardiogram examinations, most                 recent 06/24/2017. Risk Factors:Sleep Apnea, Diabetes and Former                 Smoker. GERD.  Sonographer:    Clayton Lefort RDCS (AE) Referring Phys: [phone] MCNEILL P [facility]  Sonographer Comments: Technically difficult study due to poor echo windows. Image acquisition challenging due to respiratory motion. IMPRESSIONS  1. Left ventricular ejection fraction, by estimation, is 65 to 70%. The left ventricle has normal  function. The left ventricle has no regional wall motion abnormalities. There is severe concentric left ventricular hypertrophy. Left ventricular diastolic  parameters are consistent with Grade I diastolic dysfunction (impaired relaxation). Elevated left atrial pressure.  2. Right ventricular systolic function is normal. The right ventricular size is mildly enlarged. There is normal pulmonary artery systolic pressure.  3. The mitral valve is normal in structure. No evidence of mitral valve regurgitation. Mild mitral stenosis. The mean mitral valve gradient is 3.0 mmHg. Moderate mitral annular calcification.  4. The aortic valve is normal in structure. There is severe calcifcation of the aortic valve. There is severe thickening of the aortic valve. Aortic valve regurgitation is not visualized. Mild aortic valve stenosis. Aortic valve mean gradient measures 9.0 mmHg.  5. The inferior vena cava is normal in size with greater than 50% respiratory variability, suggesting right atrial pressure of 3 mmHg. FINDINGS  Left Ventricle: Left ventricular ejection fraction, by estimation, is 65 to 70%. The left ventricle has normal function. The left ventricle has no regional wall motion abnormalities. Definity contrast agent was given IV to delineate the left ventricular   endocardial borders. The left ventricular internal cavity size was normal in size. There is severe concentric left ventricular hypertrophy. Left ventricular diastolic parameters are consistent with Grade I diastolic dysfunction (impaired relaxation). Elevated left atrial pressure. Right Ventricle: The right ventricular size is mildly enlarged. No increase in right ventricular wall thickness. Right ventricular systolic function is normal. There is normal pulmonary artery systolic pressure. Left Atrium: Left atrial size was normal in size. Right Atrium: Right atrial size was normal in size. Pericardium: There is no evidence of pericardial effusion. Mitral Valve: The mitral valve is normal in structure. There is moderate thickening of the mitral valve leaflet(s). There is moderate calcification of the mitral valve leaflet(s). Moderate mitral annular calcification. No evidence of mitral valve regurgitation. Mild mitral valve stenosis. MV peak gradient, 10.9 mmHg. The mean mitral valve gradient is 3.0 mmHg. Tricuspid Valve: The tricuspid valve is normal in structure. Tricuspid valve regurgitation is mild . No evidence of tricuspid stenosis. Aortic Valve: The aortic valve is normal in structure. There is severe calcifcation of the aortic valve. There is severe thickening of the aortic valve. Aortic valve regurgitation is not visualized. Mild aortic stenosis is present. Aortic valve mean gradient measures 9.0 mmHg. Aortic valve peak gradient measures 15.7 mmHg. Aortic valve area, by VTI measures 1.81 cm. Pulmonic Valve: The pulmonic valve was normal in structure. Pulmonic valve regurgitation is not visualized. No evidence of pulmonic stenosis. Aorta: The aortic root is normal in size and structure. Venous: The inferior vena cava is normal in size with greater than 50% respiratory variability, suggesting right atrial pressure of 3 mmHg. IAS/Shunts: No atrial level shunt detected by color flow Doppler.  LEFT VENTRICLE PLAX  2D LVIDd:         4.70 cm  Diastology LVIDs:         3.30 cm  LV e' medial:    6.42 cm/s LV PW:         1.80 cm  LV E/e' medial:  15.5 LV IVS:        1.60 cm  LV e' lateral:   6.64 cm/s LVOT diam:     2.00 cm  LV E/e' lateral: 15.0 LV SV:         71 LV SV Index:   33 LVOT Area:     3.14 cm  RIGHT VENTRICLE  IVC RV Basal diam:  3.50 cm     IVC diam: 1.60 cm RV Mid diam:    3.90 cm RV S prime:     11.70 cm/s TAPSE (M-mode): 2.4 cm LEFT ATRIUM             Index       RIGHT ATRIUM           Index LA diam:        3.60 cm 1.68 cm/m  RA Area:     17.70 cm LA Vol (A2C):   59.5 ml 27.75 ml/m RA Volume:   43.70 ml  20.38 ml/m LA Vol (A4C):   53.2 ml 24.81 ml/m LA Biplane Vol: 61.9 ml 28.87 ml/m  AORTIC VALVE AV Area (Vmax):    1.69 cm AV Area (Vmean):   1.83 cm AV Area (VTI):     1.81 cm AV Vmax:           198.33 cm/s AV Vmean:          137.000 cm/s AV VTI:            0.390 m AV Peak Grad:      15.7 mmHg AV Mean Grad:      9.0 mmHg LVOT Vmax:         107.00 cm/s LVOT Vmean:        79.800 cm/s LVOT VTI:          0.225 m LVOT/AV VTI ratio: 0.58  AORTA Ao Root diam: 3.20 cm Ao Asc diam:  3.20 cm MITRAL VALVE MV Area (PHT): 1.89 cm     SHUNTS MV Area VTI:   1.89 cm     Systemic VTI:  0.22 m MV Peak grad:  10.9 mmHg    Systemic Diam: 2.00 cm MV Mean grad:  3.0 mmHg MV Vmax:       1.65 m/s MV Vmean:      78.7 cm/s MV Decel Time: 401 msec MV E velocity: 99.50 cm/s MV A velocity: 175.00 cm/s MV E/A ratio:  0.57 Ena Dawley MD Electronically signed by Ena Dawley MD Signature Date/Time: 09/09/2020/10:39:50 PM    Final    IR PERCUTANEOUS ART THROMBECTOMY/INFUSION INTRACRANIAL INC DIAG ANGIO  Result Date: 09/11/2020 INDICATION: New onset left-sided numbness, left-sided neglect and confusion. Occluded right middle cerebral artery inferior division distal M2 M3 segment on CT angiogram of the head and neck. EXAM: 1. EMERGENT LARGE VESSEL OCCLUSION THROMBOLYSIS (anterior CIRCULATION) COMPARISON:  CT  angiogram of the head and neck of September 09, 2020. MEDICATIONS: Vancomycin 1 g IV antibiotic was administered within 1 hour of the procedure. ANESTHESIA/SEDATION: General anesthesia CONTRAST:  Isovue 300 approximately 90 mL FLUOROSCOPY TIME:  Fluoroscopy Time: 55 minutes 12 seconds (3749 mGy). COMPLICATIONS: None immediate. TECHNIQUE: Following a full explanation of the procedure along with the potential associated complications, an informed witnessed consent was obtained from the patient's spouse. The risks of intracranial hemorrhage of 10%, worsening neurological deficit, ventilator dependency, death and inability to revascularize were all reviewed in detail with the patient's spouse. The patient was then put under general anesthesia by the Department of Anesthesiology at [facility]. The right groin was prepped and draped in the usual sterile fashion. Thereafter using modified Seldinger technique, transfemoral access into the right common femoral artery was obtained without difficulty. Over a 0.035 inch guidewire an 8 French 25 cm Pinnacle sheath was inserted. Through this, and also over a 0.035 inch guidewire a 5 [place] JB 1 catheter  was advanced to the aortic arch region and selectively positioned in the right common carotid artery and the innominate artery. FINDINGS: The innominate arteriogram demonstrates patency of the right subclavian artery. The right vertebral artery demonstrates moderate tortuosity with wide patency. The vessel is seen to opacify to the cranial skull base. The right common carotid arteriogram demonstrates a complex heterogeneous partially calcified arteriosclerotic plaque extending from the distal common carotid artery into the right external carotid artery and the right internal carotid artery just distal to the bulb. Moderate to severe narrowing is seen of the right external carotid artery. Its branches are widely opacified. The right internal carotid artery at the bulb  demonstrates approximately 70% stenosis associated with a prominent ulceration along the posterior wall, and also a reach of irregular atherosclerotic plaque along the lateral aspect associated with a 50% stenosis. More distally the right internal carotid artery is seen to opacify normally to the cranial skull base. The petrous, the cavernous and the supraclinoid segments are widely patent. The right middle cerebral artery demonstrates patency with occlusion of the inferior division of the right middle cerebral artery in the M2 M3 region with prominent area of hypoperfusion involving the anterior parietal posterior frontal regions. Venous phase is within normal limits. Hypoplastic right anterior cerebral artery is seen opacifying into the distal right anterior cerebral A2 segment. PROCEDURE: The diagnostic JB 1 catheter in the right common carotid artery was then exchanged over a 0.035 inch 300 cm Rosen exchange guidewire for an 087 balloon guide catheter which had been prepped with 50% contrast and 50% heparinized saline infusion. The guidewire was removed. Good aspiration obtained from the hub of the balloon guide catheter which was positioned just proximal to the origin of the right internal carotid artery. Control arteriogram was then performed which demonstrated no change in the extracranial or intracranial circulation. Over a 0.014 inch standard Synchro micro guidewire with a J configuration, an 021 microcatheter was then advanced inside of an 071 36 cm Zoom aspiration catheter to the distal aspect of the balloon guide catheter. The micro guidewire and the microcatheter were advanced without event into the petrous horizontal segment. However, advancement of the Zoom guide catheter was met with resistance secondary to the irregular nature of the complex arteriosclerotic plaque in the carotid bulb region. This system was then removed. Over a 0.014 inch standard Synchro micro guidewire an 021 Trevo ProVue  microcatheter was then advanced into the horizontal petrous segment. The guidewire was removed. Good aspiration obtained from the hub of the catheter. This in turn was exchanged for an 014 inch 300 cm Synchro exchange micro guidewire. The distal end of the micro guidewire was positioned in the horizontal petrous segment with a J configuration. Over the exchange micro guidewire, a 4/30 cm Viatrac 14 angioplasty balloon catheter which had been prepped with 50% contrast and 50% heparinized saline infusion and with heparinized saline infusion was advanced using the rapid exchange technique and positioned with the markers distally and proximally aligned appropriately. A control angioplasty was then performed using micro inflation syringe device via micro tubing. Balloon was expanded to just over 4 mm where it was maintained for a full 1 minute. Balloon was then deflated and retrieved and removed proximally. A control arteriogram performed through the balloon guide demonstrated significantly improved caliber and flow through the angioplastied segment. Over the exchange micro guidewire, a combination of an 021 microcatheter inside of an 055 136 cm Zoom aspiration catheter was then advanced without difficulty to the petrous  horizontal segment. The exchange micro guidewire was removed. Good aspiration obtained from the microcatheter. Over a 0.014 inch standard Synchro micro guidewire with a J configuration, the combination of the microcatheter and micro guidewire was then advanced to the distal M1 segment. Using a torque device, the inferior division was then entered followed by the microcatheter and advanced distally to just proximal to the occluded prominent branch. The Zoom aspiration catheter was then advanced into the inferior division. Thereafter, attempts were made to advance the micro guidewire through the occluded inferior division in the distal M2 segment which was met with significant resistance. A different micro  guidewire was then advanced and attempts were made to advance through the occluded segment. The micro guidewire eventually was able to be advanced distal to the occlusion, however, the microcatheter met with significant resistance. Micro guidewire and the microcatheter were retrieved and removed. An 035 aspiration catheter was then advanced over an 014 inch micro guidewire distal to the 65 Zoom aspiration catheter and advanced such that the micro guidewire and the 035 Zoom was in contact with the occluded segment of the inferior division. The 55 aspiration catheter was then advanced to be contact with the occluded firm clot. Thereafter, the micro guidewire was removed as was the 35 aspiration catheter due to lack of penetration through the occluded segment. Constant aspiration was then applied over about 3-4 minutes through the 55 aspiration catheter. The 55 Zoom aspiration catheter was then retrieved more proximally until there was free aspiration. Control arteriogram performed through the Zoom aspiration catheter demonstrated no change in the occluded dominant inferior division in the distal M2 segment. At this time, in a coaxial manner and with constant heparinized saline infusion, an 017 Headway microcatheter was then advanced over a 0.014 inch standard Synchro micro guidewire distal to the 55 Zoom aspiration catheter which was in the distal right M1 segment. The micro guidewire was gently advanced with the microcatheter this time without any difficulty through the occlusion into the distal M2 M3 junction followed by the microcatheter. The micro guidewire was removed. Good aspiration obtained from the hub of the microcatheter which was then connected to continuous heparinized saline infusion. A Tiger 17 retrieval was then advanced and deployed in the usual manner from the proximal M3 to the proximal M2 segment. The aspiration catheter was then advanced to just at the level of the occluded inferior division.  Aspiration then applied through the aspiration catheter for approximately 2 minutes. The Tiger device was expanded and deflated approximately 7 times. Thereafter, the combination of the retrieval device, and the microcatheter, and the Zoom microcatheter was then retrieved and removed. A control arteriogram performed through the balloon guide which had now been advanced into the distal right into the internal carotid artery demonstrated near complete revascularization of the previously occluded inferior division M2 M3 segment with a TICI 3 revascularization. There continued to be a filling defect at the junction of the previously occluded vessel, and side-branch. This was for approximately 20 minutes with progressive slightly decreased hemodynamic flow distal to the treated vessel. It was, therefore, elected to proceed with rescue stenting. Prior to this, the patient was loaded with aspirin 81 mg and and Brilinta 180 mg via an orogastric tube. CT of the brain demonstrated mild hyperattenuation in the subarachnoid space in the sylvian fissure extending into the convexity of the posterior frontal and anterior parietal regions. The 55 Zoom aspiration catheter in combination with in 021 microcatheter over a 0.014 inch standard Synchro micro guidewire  was then advanced in combination into the distal M1 segment. Micro guidewire was then advanced without difficulty through the previously revascularized branch into the distal M3 region, followed by the microcatheter. The guidewire was removed. Good aspiration obtained from the microcatheter. Which the connected to continuous heparin saline infusion. It was elected to proceed with placement of a 4 mm x 24 mm Neuroform atlas stent. This was then advanced in a coaxial manner with constant heparinized saline infusion to the distal marker on the microcatheter. The O ring on the delivery microcatheter was loosened. With slight forward gentle traction with the right hand on the  delivery micro guidewire with the right hand, the microcatheter was then unsheathed deploying the stent. The delivery micro guidewire and the microcatheter was gently retrieved more proximally. A control arteriogram performed through the 55 aspiration catheter and in the distal right internal carotid artery now demonstrates significantly improved caliber and flow through the entirety of the right middle cerebral artery inferior division. A TICI 3 revascularization was maintained. The right internal carotid artery continued to demonstrate excellent flow through the right middle and the right anterior cerebellar arteries. The balloon guide catheter was gently retrieved with the balloon guide catheter gently retrieved with the 55 aspiration catheter proximal to the right common carotid bifurcation. A control arteriogram performed through the balloon guide catheter in the right common carotid artery continued to demonstrate significantly improved caliber and flow through the previous angioplastjed segment of the right internal carotid artery at the bulb. There continued be approximately 50% stenosis. However, given the significant improved flow it was elected to proceed with stenting at this time. Intracranially there continued be no evidence of intraluminal filling defects or of occlusions. No mass-effect was noted. The balloon guide was removed. The 8 French Pinnacle sheath was removed with successful hemostasis of the right groin puncture site with an 8 French Angio-Seal closure device. The right groin appeared soft. Distal pulses remained palpable unchanged prior to procedure. A repeat CT of the brain demonstrated marginally increased hyperattenuation in the posterior perisylvian, and the frontal parietal regions. There is no mass effect or midline shift. 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 The patient's general anesthesia was reversed and the patient was extubated without  difficulty. Upon recovery, the patient was able to move all his fours adequately. He denied any headaches, nausea or vomiting. He continued to have facial asymmetry. However, his speech was relatively clear. He was then transferred to the neuro ICU for post revascularization management. IMPRESSION: Status post endovascular complete revascularization of a calcified arteriosclerotic plaque in the right inferior division of the right middle cerebral artery M2 M3 segment with 1 pass with contact aspiration, 1 pass with the Tiger 17 retrieval device and aspiration achieving a TICI 3 revascularization Placement of a rescue stent at the site of the occluded inferior division distal M2 M3 region with continued TICI 3 revascularization. PLAN: Follow-up in the clinic 2-4 weeks post discharge. Electronically Signed   By: Luanne Bras M.D.   On: 09/10/2020 11:03   CT HEAD CODE STROKE WO CONTRAST  Result Date: 09/09/2020 CLINICAL DATA:  Code stroke. Neuro deficit, acute, stroke suspected. Additional history provided: Right-sided facial droop, last known well 9:15 a.m. EXAM: CT HEAD WITHOUT CONTRAST TECHNIQUE: Contiguous axial images were obtained from the base of the skull through the vertex without intravenous contrast. COMPARISON:  Head CT 02/08/2020. FINDINGS: Brain: Mild cerebral atrophy. Redemonstrated subdural hematomas overlying the bilateral cerebral convexities. These are unchanged in  size as compared to the prior head CT of 02/08/2020 again measuring up to 9 mm in thickness bilaterally. Although intermediate to low-density, the density of the right subdural hematoma has subtly increased as compared to the prior examination which may reflect some degree of interval hemorrhage into the collection. 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 Scattered small foci of calcification within both collections. Symmetric mass effect upon the underlying cerebral hemispheres without midline  shift. Ill-defined hypoattenuation within the cerebral white matter is nonspecific, but compatible with chronic small vessel ischemic disease. No acute demarcated cortical infarct. No evidence of intracranial mass. Vascular: No hyperdense vessel.  Atherosclerotic calcifications. Skull: No calvarial fracture.  Left-sided cranioplasty. Sinuses/Orbits: Partial opacification of left ethmoid air cells. Background mild bilateral ethmoid and sphenoid sinus mucosal thickening. ASPECTS ([facility] Stroke Program Early CT Score) - Ganglionic level infarction (caudate, lentiform nuclei, internal capsule, insula, M1-M3 cortex): 7 - Supraganglionic infarction (M4-M6 cortex): 3 Total score (0-10 with 10 being normal): 10 Dr. Kathrynn Speed aware of these findings at 11:20 a.m. on 09/09/2020 (discussed with Dr. Lars Pinks). IMPRESSION: Redemonstrated subdural hematomas overlying the bilateral cerebral convexities. As before, these hematomas measure up to 9 mm in greatest thickness. Although intermediate to low-density, the density of the right subdural hematoma has subtly increased from the examination of 02/08/2020, suggestive of some degree of interval hemorrhage into the collection. The left subdural hematoma remains low-density. Unchanged symmetric mass effect upon the cerebral hemispheres without midline shift. No acute demarcated cortical infarct is identified.  ASPECTS is 10. Stable generalized atrophy of the brain and chronic small vessel ischemic disease. Electronically Signed   By: Kellie Simmering DO   On: 09/09/2020 11:35   CT ANGIO HEAD CODE STROKE  Result Date: 09/09/2020 CLINICAL DATA:  80 year old male code stroke. Neglect. Right parietal lobe implicated. History of unresolved subdural hematomas. EXAM: CT ANGIOGRAPHY HEAD AND NECK CT PERFUSION BRAIN TECHNIQUE: Multidetector CT imaging of the head and neck was performed using the standard protocol during bolus administration of intravenous contrast. Multiplanar CT  image reconstructions and MIPs were obtained to evaluate the vascular anatomy. Carotid stenosis measurements (when applicable) are obtained utilizing NASCET criteria, using the distal internal carotid diameter as the denominator. Multiphase CT imaging of the brain was performed following IV bolus contrast injection. Subsequent parametric perfusion maps were calculated using RAPID software. CONTRAST:  120m OMNIPAQUE IOHEXOL 350 MG/ML SOLN COMPARISON:  Plain head CT 1105 hours today reported separately. Intracranial MRA 06/24/2017. FINDINGS: CT Brain Perfusion Findings: ASPECTS: 10 CBF (<30%) Volume: 16mPerfusion (Tmax>6.0s) volume: 4148m Hypoperfusion index 0.1 Mismatch Volume: 38m55mfarction Location:Posterior right MCA territory. CTA NECK Skeleton: Previous left superior frontal craniotomy. Chronic bilateral calvarium burr holes. No acute osseous abnormality identified. Cervical spine degeneration. Upper chest: Partially visible left chest cardiac pacemaker. Otherwise negative. Other neck: Mild motion artifact. No acute findings identified in the neck. Aortic arch: 3 vessel arch configuration. Relatively mild Calcified aortic atherosclerosis. Right carotid system: Mild calcified plaque of the brachiocephalic artery without stenosis. Normal right CCA origin. Intermittent moderate calcified plaque in the right CCA proximal to the bifurcation, and at the bifurcation a radiographic string sign occurs at the ICA origin related to very bulky calcified plaque on series 5, image 108. Additional bulky plaque through the distal bulb, tandem stenosis on series 5, image 103. But the vessel remains patent. No additional stenosis to the skull base. Left carotid system: Mild left CCA origin but similar moderate left CCA and left carotid  bifurcation through ICA bulb calcified plaque. On this side subsequent stenosis is less than 50 % with respect to the distal vessel. Vertebral arteries: Mild to moderate proximal right  subclavian calcified plaque. Calcified plaque at the right vertebral artery origin with mild to moderate stenosis. Patent right vertebral then to the skull base. Mild proximal left subclavian and left vertebral artery origin calcified plaque without stenosis. Tortuous left V1 segment. Codominant left vertebral is otherwise normal to the skull base. CTA HEAD Posterior circulation: Negative distal vertebral arteries and basilar. The left V4 is mildly dominant. Normal PICA origins. Patent SCA and PCA origins. Small posterior communicating arteries. Mild left PCA P1 irregularity. Bilateral PCA branches are within normal limits. Anterior circulation: Both ICA siphons are patent. On the left cavernous segment calcified plaque results in no stenosis. Normal left ophthalmic and posterior communicating artery origins. On the right cavernous segment calcified plaque results in no significant stenosis. Patent carotid termini, MCA and ACA origins. Dominant left A1. 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 Otherwise the bilateral ACA branches are within normal limits. Left MCA M1 segment and bifurcation are patent. Left MCA branches appears table since 2018. Right MCA M1 and bifurcation remain patent. Dominant posterior right MCA M2 branch is occluded at the level of punctate calcification on series 12, image 14. This is at a bifurcation where the superior branch remains patent. But the parent vessel is occluded. This calcification is new compared to last year. Venous sinuses: Patent. Anatomic variants: Dominant left ACA A1. Review of the MIP images confirms the above findings IMPRESSION: 1. Positive for emergent occlusion of a posterior Right MCA distal M2 branch at its bifurcation. Embolized calcification there is new since last year (series 12, image 14). 2. CT Perfusion detects a small infarct core of 13 mL with regional penumbra of 41 mL (28 mL  mismatch). 3. Superimposed very bulky calcified plaque at the Right ICA origin with tandem HIGH-GRADE STENOSIS, including Radiographic String Sign (series 7, image 216). 4. The above were reviewed in person with Dr. Roland Rack on 09/09/2020 at 1128 hours. 5. 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 6. Bulky Left Carotid bifurcation and proximal left ICA calcified plaque without significant stenosis. 7. Up to moderate Right Vertebral Artery origin stenosis. Electronically Signed   By: Genevie Ann M.D.   On: 09/09/2020 11:46   CT ANGIO NECK CODE STROKE  Result Date: 09/09/2020 CLINICAL DATA:  80 year old male code stroke. Neglect. Right parietal lobe implicated. History of unresolved subdural hematomas. EXAM: CT ANGIOGRAPHY HEAD AND NECK CT PERFUSION BRAIN TECHNIQUE: Multidetector CT imaging of the head and neck was performed using the standard protocol during bolus administration of intravenous contrast. Multiplanar CT image reconstructions and MIPs were obtained to evaluate the vascular anatomy. Carotid stenosis measurements (when applicable) are obtained utilizing NASCET criteria, using the distal internal carotid diameter as the denominator. Multiphase CT imaging of the brain was performed following IV bolus contrast injection. Subsequent parametric perfusion maps were calculated using RAPID software. CONTRAST:  150m OMNIPAQUE IOHEXOL 350 MG/ML SOLN COMPARISON:  Plain head CT 1105 hours today reported separately. Intracranial MRA 06/24/2017. FINDINGS: CT Brain Perfusion Findings: ASPECTS: 10 CBF (<30%) Volume: 116mPerfusion (Tmax>6.0s) volume: 4164m Hypoperfusion index 0.1 Mismatch Volume: 52m68mfarction Location:Posterior right MCA territory. CTA NECK Skeleton: Previous left superior frontal craniotomy. Chronic bilateral calvarium burr holes. No acute osseous abnormality identified. Cervical spine degeneration. Upper  chest: Partially visible left chest cardiac  pacemaker. Otherwise negative. Other neck: Mild motion artifact. No acute findings identified in the neck. Aortic arch: 3 vessel arch configuration. Relatively mild Calcified aortic atherosclerosis. Right carotid system: Mild calcified plaque of the brachiocephalic artery without stenosis. Normal right CCA origin. Intermittent moderate calcified plaque in the right CCA proximal to the bifurcation, and at the bifurcation a radiographic string sign occurs at the ICA origin related to very bulky calcified plaque on series 5, image 108. Additional bulky plaque through the distal bulb, tandem stenosis on series 5, image 103. But the vessel remains patent. No additional stenosis to the skull base. Left carotid system: Mild left CCA origin but similar moderate left CCA and left carotid bifurcation through ICA bulb calcified plaque. On this side subsequent stenosis is less than 50 % with respect to the distal vessel. Vertebral arteries: Mild to moderate proximal right subclavian calcified plaque. Calcified plaque at the right vertebral artery origin with mild to moderate stenosis. Patent right vertebral then to the skull base. Mild proximal left subclavian and left vertebral artery origin calcified plaque without stenosis. Tortuous left V1 segment. Codominant left vertebral is otherwise normal to the skull base. CTA HEAD Posterior circulation: Negative distal vertebral arteries and basilar. The left V4 is mildly dominant. Normal PICA origins. Patent SCA and PCA origins. Small posterior communicating arteries. Mild left PCA P1 irregularity. Bilateral PCA branches are within normal limits. Anterior circulation: Both ICA siphons are patent. On the left cavernous segment calcified plaque results in no stenosis. Normal left ophthalmic and posterior communicating artery origins. On the right cavernous segment calcified plaque results in no significant stenosis. Patent carotid termini, MCA and ACA origins. Dominant left A1. 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 Otherwise the bilateral ACA branches are within normal limits. Left MCA M1 segment and bifurcation are patent. Left MCA branches appears table since 2018. Right MCA M1 and bifurcation remain patent. Dominant posterior right MCA M2 branch is occluded at the level of punctate calcification on series 12, image 14. This is at a bifurcation where the superior branch remains patent. But the parent vessel is occluded. This calcification is new compared to last year. Venous sinuses: Patent. Anatomic variants: Dominant left ACA A1. Review of the MIP images confirms the above findings IMPRESSION: 1. Positive for emergent occlusion of a posterior Right MCA distal M2 branch at its bifurcation. Embolized calcification there is new since last year (series 12, image 14). 2. CT Perfusion detects a small infarct core of 13 mL with regional penumbra of 41 mL (28 mL mismatch). 3. Superimposed very bulky calcified plaque at the Right ICA origin with tandem HIGH-GRADE STENOSIS, including Radiographic String Sign (series 7, image 216). 4. The above were reviewed in person with Dr. Roland Rack on 09/09/2020 at 1128 hours. 5. 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 6. Bulky Left Carotid bifurcation and proximal left ICA calcified plaque without significant stenosis. 7. Up to moderate Right Vertebral Artery origin stenosis. Electronically Signed   By: Genevie Ann M.D.   On: 09/09/2020 11:46       HISTORY OF PRESENT ILLNESS Patient was brought to [facility] as a code stroke via EMS. LKW was 0915 at rehab when he began to have difficulty standing and walking and was noted to have a right facial droop. Also, c/o some dizziness, but no HA or vision changes. Patient states he  goes to rehab for balance issues. He denied any weakness in his extremities or numbness/tingling. CBG in field was 178 and  BP 144/82.   CT showed - Positive for emergent occlusion of a posterior Right MCA distal M2 branch at its bifurcation. Embolized calcification there is new since last year (series 12, image 14). CT Perfusion detects a small infarct core of 13 mL with regional penumbra of 41 mL (28 mL mismatch). Superimposed very bulky calcified plaque at the Right ICA origin with tandem HIGH-GRADE STENOSIS, including Radiographic String Sign (series 7, image 216). The above were reviewed in person with Dr. Roland Rack on 09/09/2020 at 1128 hours. 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 Bulky Left Carotid bifurcation and proximal left ICA calcified plaque without significant stenosis.Up to moderate Right Vertebral Artery origin stenosis. MR Angio showed - Large acute/subacute left-sided infarct with parenchymal hemorrhage measuring over 4 cm. PH2 hemorrhage. Element of acute right subarachnoid hemorrhage. Chronic bilateral subdural hematomas. No significant midline shift.  He was taken by IR and had Revascularization of occluded dominant right MCA inferior division of distal M2/3 with placement of stent and right ICA baloon angioplasty.   HOSPITAL COURSE Patient presented to [facility] on 2/28 via EMS due to difficulty standing and walking and having a Right facial droop at Rehab that morning. CT showed showed right MCA M2 branch occlusion. MR angio showed Large acute/subacute left-sided infarct with parenchymal hemorrhage measuring over 4 cm. PH2 hemorrhage. Due to his history of SDH tPA was not given. He was taken by IR and they preformed a revascularization of occluded dominant right MCA inferior division of distal M2/3 with placement of stent and right ICA baloon angioplasty. He developed Cerebral Edema requiring 3% NaCl and Dysphagia requiring a CorTrak placement, which was eventually weaned off of. Due to a Seizure history he was on Keppra but this was reduced to due to  somnolence. PT/OT recommended CIR.  In the admission, he required treatment for aspiration pneumonia and respiratory failure.  His diabetes type 2, which was uncontrolled, was also treated during this admission.  He has chronic kidney disease 3B, which worsened during admission and was treated with fluids and is now at baseline.  He required aggressive blood pressure management due to revascularization of the right carotid and is now on his home medications with adequate blood pressure control.  Goal blood pressure should be kept normotensive.  He is currently on dual antiplatelets-with the knowledge that he had had bilateral subdural hematomas, which would otherwise have precluded him from aggressive antiplatelet or anticoagulant therapy, but given his recent right carotid stenting, he will continue on dual antiplatelet therapy.  He will follow-up with outpatient neurology in outpatient neuro interventional radiology as below.  Planned discharge on 09/18/2020-orders placed, discharge summary made ready but he became more lethargic and difficult to arouse.  Discharge was deferred. Repeat labs unremarkable.  Repeat imaging unremarkable. Hospitalist recommended- Incentive spirometer every hour while awake, Pulmonary toiletry and flutter valve, and Up to chair every shift. His drowsiness improved and he was ready for discharge.  On day of discharge 3/11 he was alert and awake. He reports that he is doing well. He has no complaints of SOB. He reports no pains. He is eager to go to Rehab and encouraged his continued hard work. He has no concerns at present.  DISCHARGE EXAM Blood pressure 113/62, pulse 80, temperature 98 F (36.7 C), temperature source Oral, resp. rate 16, height _0  (1.753 m), weight  93 kg, SpO2 95 %.  General: alert and awake, elderly obese caucasian male, no apparent distress  Lungs: Symmetrical Chest rise, no labored breathing  Cardio: Regular Rate and Rhythm  Abdomen: Soft,  non-tender  Neuro: Alert, oriented, thought content appropriate.  Speech fluent without evidence of aphasia but is dysarthric.  Able to follow all commands without difficulty. Cranial Nerves: II:  Visual fields grossly normal improved tracking on the Left side, pupils equal, round, reactive to light and accommodation III,IV, VI: ptosis not present, extra-ocular motions intact bilaterally V,VII: smile symmetric, facial light touch sensation normal bilaterally VIII: hearing normal bilaterally IX,X: uvula rises symmetrically XI: bilateral shoulder shrug XII: midline tongue extension without atrophy or fasciculations  Motor: LUE drifts Right :  Upper extremity   4+/5                                      Left:    Upper extremity   4+/5             Lower extremity   4+/5                                      Lower extremity   4/5 Tone and bulk:normal tone throughout; no atrophy noted Sensory: light touch intact throughout, bilaterally  Discharge Diet      Diet   DIET - DYS 1 Room service appropriate? Yes; Fluid consistency: Nectar Thick   liquids  DISCHARGE PLAN  Disposition:  Transfer to [facility] for ongoing PT, OT and ST  aspirin 81 mg daily and Brilinta (ticagrelor) 45 mg BID for secondary stroke prevention.  Recommend ongoing stroke risk factor control by Primary Care Physician at time of discharge from inpatient rehabilitation.  Follow-up PCP Janith Lima, MD in 2 weeks following discharge from rehab.  Follow-up in [facility] in 4 weeks following discharge from rehab, office to schedule an appointment.   Follow up with Dr. Estanislado Pandy - Neuro IR in 4-6 weeks  35 minutes were spent preparing discharge.  Fatima Sanger MD Resident   Attending Neurohospitalist Addendum Patient seen and examined with APP/Resident. Agree with the history and physical as documented above. Agree with the plan as documented, which I helped  formulate. I have independently reviewed the chart, obtained history, review of systems and examined the patient.I have personally reviewed pertinent head/neck/spine imaging (CT/MRI). Spoke with his wife at bedside.  Answered all questions. Follow-up with [facility] and interventional radiology as above.  Follow-up with PCP as above. Please feel free to call with any questions.  -- Amie Portland, MD Stroke Neurology Pager: [phone]

## 2020-09-18 NOTE — Progress Notes (Addendum)
Patient complaining of SOB, SpO2 94% on 2L , with periods of apnea. Left upper crackles and expiratory wheezes auscultated,  MD notified, verbal orders received for STAT CXR will continue to monitor

## 2020-09-18 NOTE — TOC Transition Note (Addendum)
Transition of Care [facility]) - CM/SW Discharge Note   Patient Details  Name: Frank [name] MRN: [id number] Date of Birth: 1941-03-30  [facility] of [facility] CM/SW Contact:  Marney Setting, [place] Work Phone Number: 09/18/2020, 11:01 AM   Clinical Narrative:  Nurse to call report to [phone].   UPDATE 3:09 PM: Per chart review, noting that patient is experiencing new acute symptoms, not stable for discharge at this time. CSW updated [facility] that discharge is on hold, and spoke with wife who is on her way to the hospital. CSW to continue to follow.    Final next level of care: IP Rehab Facility Barriers to Discharge: No Barriers Identified   Patient Goals and CMS Choice Patient states their goals for this hospitalization and ongoing recovery are:: to get rehab CMS Medicare.gov Compare Post Acute Care list provided to:: Patient Represenative (must comment) Choice offered to / list presented to : Spouse  Discharge Placement              Patient chooses bed at:  ([facility]) Patient to be transferred to facility by: [place] Name of family member notified: Ina Patient and family notified of of transfer: 09/18/20  Discharge Plan and Services     Post Acute Care Choice: IP Rehab                               Social Determinants of Health (SDOH) Interventions     Readmission Risk Interventions No flowsheet data found.

## 2020-09-18 NOTE — Progress Notes (Addendum)
Nutrition Follow-up  DOCUMENTATION CODES:   Obesity unspecified  INTERVENTION:  D/c calorie count   Continue Magic cup TID with meals, each supplement provides 290 kcal and 9 grams of protein  Continue Mighty Shake po TID, each supplement provides 330 kcal and 9 grams of protein  NUTRITION DIAGNOSIS:   Inadequate oral intake related to inability to eat,lethargy/confusion as evidenced by NPO status.  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  3/2 - Cortrak tube placed (tip in gastric), TF initiated 3/4- s/p MBSS- advanced to dysphagia 1 diet with thin liquids 3/6- transitioned to nocturnal feedings  RD went to follow-up on calorie count to provide results from Day 2, but there were no meal tickets in the calorie count envelope. Note -- yesterday, pt able to meet 66% minimum kcal needs and 58% minimum protein needs.   Per MD, pt was ready for d/c this morning but developed shortness of breath. Pt also noted to be lethargic. MD notes plan for d/c tomorrow.   Note Cortrak was removed today. Will d/c calorie count given pt without Cortrak and plans for d/c tomorrow.   UOP: 1365ml x24 hours  Medications: ss novolog Q4H, 2 units novolog Q4H, 5 units lantus daily, protonix, senokot-s Labs: CBGs [id number]  Diet Order:   Diet Order            Diet - low sodium heart healthy           DIET - DYS 1 Room service appropriate? Yes; Fluid consistency: Nectar Thick  Diet effective now                 EDUCATION NEEDS:   No education needs have been identified at  this time  Skin:  Skin Assessment: Reviewed RN Assessment  Last BM:  09/17/20 type 6  Height:   Ht Readings from Last 1 Encounters:  09/09/20 5\' 9"  (1.753 m)    Weight:   Wt Readings from Last 1 Encounters:  09/18/20 94.9 kg    Ideal Body Weight:  72.7 kg  BMI:  Body mass index is 30.9 kg/m.  Estimated Nutritional Needs:   Kcal:  1900-2100  Protein:  95-110 grams  Fluid:  >2 L    Larkin Ina, MS, RD, LDN RD pager number and weekend/on-call pager number located in [place].

## 2020-09-18 NOTE — Plan of Care (Signed)
  Problem: Education: Goal: Knowledge of General Education information will improve Description: Including pain rating scale, medication(s)/side effects and non-pharmacologic comfort measures Outcome: Progressing   Problem: Clinical Measurements: Goal: Will remain free from infection Outcome: Progressing Goal: Cardiovascular complication will be avoided Outcome: Progressing   Problem: Coping: Goal: Level of anxiety will decrease Outcome: Progressing   Problem: Elimination: Goal: Will not experience complications related to bowel motility Outcome: Progressing Goal: Will not experience complications related to urinary retention Outcome: Progressing   Problem: Pain Managment: Goal: General experience of comfort will improve Outcome: Progressing   Problem: Safety: Goal: Ability to remain free from injury will improve Outcome: Progressing   Problem: Skin Integrity: Goal: Risk for impaired skin integrity will decrease Outcome: Progressing   Problem: Education: Goal: Knowledge of disease or condition will improve Outcome: Progressing Goal: Knowledge of secondary prevention will improve Outcome: Progressing Goal: Knowledge of patient specific risk factors addressed and post discharge goals established will improve Outcome: Progressing Goal: Individualized Educational Video(s) Outcome: Progressing   Problem: Coping: Goal: Will verbalize positive feelings about self Outcome: Progressing Goal: Will identify appropriate support needs Outcome: Progressing   Problem: Health Behavior/Discharge Planning: Goal: Ability to manage health-related needs will improve Outcome: Progressing   Problem: Self-Care: Goal: Verbalization of feelings and concerns over difficulty with self-care will improve Outcome: Progressing Goal: Ability to communicate needs accurately will improve Outcome: Progressing

## 2020-09-18 NOTE — Discharge Summary (Deleted)
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  Now on dual antiplatelets due to the right carotid stent. -Aspiration pneumonia, acute respiratory failure-resolved -Essential hypertension -Hyperlipidemia -Diabetes uncontrolled with hyperglycemia -CKD 3B -Coronary artery disease -Obstructive sleep apnea-  Medications to be continued on Rehab Allergies as of 09/18/2020      Reactions   Lisinopril Cough   Invokana [canagliflozin] Other (See Comments)   Stopped by MD   Penicillins Other (See Comments)   Allergic as a child.  Patient does not remember reaction.  Has had cephalasporins without problems.   Sulfamethoxazole Other (See Comments)   Unknown reaction- from childhood      Medication List    STOP taking these medications   acetaminophen 325 MG tablet Commonly known as: TYLENOL   levocetirizine 5 MG tablet Commonly known as: XYZAL   mupirocin ointment 2 % Commonly known as: BACTROBAN   psyllium 58.6 % powder Commonly known as: METAMUCIL     TAKE these medications   amLODipine 10 MG tablet Commonly known as: NORVASC Take 1 tablet (10 mg total) by mouth daily. What changed: when to take this   aspirin 81 MG chewable tablet Chew 1 tablet (81 mg total) by mouth daily.   atorvastatin 10 MG  tablet Commonly known as: LIPITOR Take 1 tablet (10 mg total) by mouth daily. What changed: medication strength   azelastine 0.1 % nasal spray Commonly known as: ASTELIN Place 1 spray into both nostrils 2 (two) times daily. Use in each nostril as directed   Carbonyl Iron 25 MG Tabs Take 25 mg by mouth daily with breakfast.   cloNIDine 0.2 mg/24hr patch Commonly known as: CATAPRES - Dosed in mg/24 hr Place 1 patch (0.2 mg total) onto the skin once a week.   doxazosin 2 MG tablet Commonly known as: CARDURA Take 1 tablet (2 mg total) by mouth at bedtime. What changed: medication strength   FLUoxetine 20 MG capsule Commonly known as: PROZAC Take 1 capsule (20 mg total) by mouth daily.   glipiZIDE 10 MG 24 hr tablet Commonly known as: GLUCOTROL XL Take 1 tablet (10 mg total) by mouth daily.   Gvoke HypoPen 2-Pack 1 MG/0.2ML Soaj Generic drug: Glucagon Inject 1 Act into the skin daily as needed. What changed:   how much to take  when to take this  reasons to take this   Insulin Pen Needle 32G X 6 MM Misc 1 Act by Does not apply route daily.   levETIRAcetam 100 MG/ML solution Commonly known as: KEPPRA Take 5 mLs (500 mg total) by mouth 2 (two) times daily.   loratadine 10 MG tablet Commonly known as: CLARITIN Take 1 tablet (10 mg total) by mouth every evening.   pantoprazole 40 MG tablet Commonly known as: PROTONIX TAKE 1 TABLET BY MOUTH  DAILY   PRESCRIPTION MEDICATION CPAP- At  bedtime   solifenacin 10 MG tablet Commonly known as: VESICARE TAKE 1 TABLET BY MOUTH  DAILY   telmisartan 20 MG tablet Commonly known as: MICARDIS TAKE 1 TABLET BY MOUTH  DAILY   ticagrelor 90 MG Tabs tablet Commonly known as: BRILINTA Take 0.5 tablets (45 mg total) by mouth 2 (two) times daily.   Toujeo Max SoloStar 300 UNIT/ML Solostar Pen Generic drug: insulin glargine (2 Unit Dial) Inject 10 Units into the skin daily.   triamcinolone 0.1 % Commonly known as:  KENALOG Apply 1 application topically daily as needed (to affected areas).       LABORATORY STUDIES CBC    Component Value Date/Time   WBC 7.2 09/18/2020 0431   RBC 3.52 (L) 09/18/2020 0431   HGB 9.3 (L) 09/18/2020 0431   HGB 10.2 (L) 12/12/2018 1431   HCT 29.5 (L) 09/18/2020 0431   PLT 129 (L) 09/18/2020 0431   PLT 160 12/12/2018 1431   MCV 83.8 09/18/2020 0431   MCH 26.4 09/18/2020 0431   MCHC 31.5 09/18/2020 0431   RDW 13.1 09/18/2020 0431   LYMPHSABS 0.7 09/10/2020 0606   MONOABS 0.8 09/10/2020 0606   EOSABS 0.0 09/10/2020 0606   BASOSABS 0.1 09/10/2020 0606   CMP    Component Value Date/Time   NA 138 09/18/2020 0431   NA 139 04/23/2020 0000   K 4.3 09/18/2020 0431   CL 100 09/18/2020 0431   CO2 29 09/18/2020 0431   GLUCOSE 130 (H) 09/18/2020 0431   BUN 34 (H) 09/18/2020 0431   BUN 27 (A) 04/23/2020 0000   CREATININE 1.42 (H) 09/18/2020 0431   CALCIUM 8.8 (L) 09/18/2020 0431   PROT 6.4 (L) 09/09/2020 1101   ALBUMIN 3.6 09/09/2020 1101   AST 22 09/09/2020 1101   ALT 22 09/09/2020 1101   ALKPHOS 69 09/09/2020 1101   BILITOT 0.7 09/09/2020 1101   GFRNONAA 50 (L) 09/18/2020 0431   GFRAA 30 (L) 12/08/2019 2250   COAGS Lab Results  Component Value Date   INR 1.0 09/09/2020   INR 0.98 08/04/2017   INR 0.94 06/23/2017   Lipid Panel    Component Value Date/Time   CHOL 107 09/10/2020 0606   TRIG 77 09/13/2020 0625   HDL 36 (L) 09/10/2020 0606   CHOLHDL 3.0 09/10/2020 0606   VLDL 42 (H) 09/10/2020 0606   LDLCALC 29 09/10/2020 0606   HgbA1C  Lab Results  Component Value Date   HGBA1C 7.8 (H) 09/10/2020   Urinalysis    Component Value Date/Time   COLORURINE YELLOW 08/21/2020 0945   APPEARANCEUR Sl Cloudy (A) 08/21/2020 0945   LABSPEC >=1.030 (A) 08/21/2020 0945   PHURINE 5.5 08/21/2020 0945   GLUCOSEU NEGATIVE 08/21/2020 0945   HGBUR TRACE-LYSED (A) 08/21/2020 0945   BILIRUBINUR NEGATIVE 08/21/2020 0945   BILIRUBINUR n 07/19/2013 1136    KETONESUR NEGATIVE 08/21/2020 0945   PROTEINUR NEGATIVE 12/08/2019 2250   UROBILINOGEN 0.2 08/21/2020 0945   NITRITE NEGATIVE 08/21/2020 0945   LEUKOCYTESUR NEGATIVE 08/21/2020 0945   Urine Drug Screen     Component Value Date/Time   LABOPIA NONE DETECTED 09/10/2020 1904   COCAINSCRNUR NONE DETECTED 09/10/2020 1904   LABBENZ NONE DETECTED 09/10/2020 1904   AMPHETMU NONE DETECTED 09/10/2020 1904   THCU NONE DETECTED 09/10/2020 1904   LABBARB NONE DETECTED 09/10/2020 1904    Alcohol Level    Component Value Date/Time   ETH <10 06/23/2017 1235     SIGNIFICANT DIAGNOSTIC STUDIES CT Head Wo Contrast  Result  Date: 09/12/2020 CLINICAL DATA:  Change in mental status.  Follow-up EXAM: CT HEAD WITHOUT CONTRAST TECHNIQUE: Contiguous axial images were obtained from the base of the skull through the vertex without intravenous contrast. COMPARISON:  Two days ago FINDINGS: Brain: Decreased subarachnoid and parenchymal hemorrhage on the right. No progressive swelling at the large right MCA branch infarct. No hydrocephalus. Small bilateral subdural hematoma with scattered high-density areas likely from septation. Vascular: No hyperdense vessel or unexpected calcification. Skull: Postoperative calvarium. Sinuses/Orbits: Negative IMPRESSION: 1. Large acute right MCA branch infarct without visible progression. Related hemorrhage is decreasing. 2. Stable subdural hematomas without significant mass effect. Electronically Signed   By: Monte Fantasia M.D.   On: 09/12/2020 09:56   CT HEAD WO CONTRAST  Result Date: 09/10/2020 CLINICAL DATA:  Stroke post revascularization and stent placement EXAM: CT HEAD WITHOUT CONTRAST TECHNIQUE: Contiguous axial images were obtained from the base of the skull through the vertex without intravenous contrast. COMPARISON:  Earlier same day FINDINGS: Brain: Parenchymal and subarachnoid hemorrhage again identified in the right cerebral hemisphere particularly about the sylvian  fissure. Extent of hyperdensity has decreased probably reflecting component of contrast staining. This is superimposed on hypoattenuation reflecting evolving infarction with extent better seen on MRI. Small volume hemorrhage is present within the occipital horns. Bilateral subdural hematomas are again identified with small volume hyperdense hemorrhage. Stable partial effacement of the right lateral ventricle and minor leftward midline shift. No hydrocephalus. Vascular: Right MCA branch stent.  No new findings. Skull: Prior left craniotomy and right burr hole. Sinuses/Orbits: No acute finding. Other: None. IMPRESSION: Evolving right MCA territory infarction with hemorrhagic conversion. Extent is better seen on same day MRI. Parenchymal, sulcal subarachnoid, and intraventricular hemorrhage are present. Bilateral subdural hematomas again identified with small volume hyperdense hemorrhage. Stable mild mass effect. Electronically Signed   By: Macy Mis M.D.   On: 09/10/2020 19:12   CT HEAD WO CONTRAST  Result Date: 09/10/2020 CLINICAL DATA:  Acute stroke suspected EXAM: CT HEAD WITHOUT CONTRAST TECHNIQUE: Contiguous axial images were obtained from the base of the skull through the vertex without intravenous contrast. COMPARISON:  Flat panel CT 09/09/2020 FINDINGS: Brain: Subarachnoid hemorrhage/contrast has increased/diffused along the right sylvian fissure and adjacent sulci. Visible cytotoxic edema in the right perisylvian cortex. There is a degree of parenchymal hemorrhage at the posterior caudate and deep white matter on the right, reaching the ependymoma with small volume intraventricular extension at the dependent lateral ventricles. No hydrocephalus. Bilateral chronic subdural hematoma with similar degree of wispy and nodular high-density posteriorly. Vascular: Right MCA branch stent. Skull: Prior burr holes on left-sided craniotomy. Sinuses/Orbits: No acute finding.  Bilateral cataract resection. Other: A  call has been placed to the ordering provider. IMPRESSION: 1. Increased subarachnoid hemorrhage/contrast around the right sylvian fissure. There is also some interval parenchymal hemorrhage in the infarct territory with small volume intraventricular extension. No hydrocephalus or midline shift. 2. Right perisylvian cortical infarct correlating with affected vessel and the perfusion maps. 3. Chronic subdural hematomas. Electronically Signed   By: Monte Fantasia M.D.   On: 09/10/2020 06:01   MR ANGIO HEAD WO CONTRAST  Result Date: 09/10/2020 CLINICAL DATA:  Acute onset of difficulty standing and walking. Right facial droop. Status post revascularization of right inferior M2 M3 occlusion. Status post balloon angioplasty of right internal carotid artery. Placement intracranial stent. EXAM: MRI HEAD WITHOUT CONTRAST TECHNIQUE: Multiplanar, multiecho pulse sequences of the brain and surrounding structures were obtained without intravenous contrast. COMPARISON:  CT head without  contrast, CT perfusion head and CTA head 09/09/2020. CT head without contrast 09/10/2020. FINDINGS: Brain: The diffusion-weighted images demonstrate an acute/subacute infarct similar in size to the predicted area of ischemia on the CT perfusion study. Parenchymal hemorrhage is noted throughout the bulk of the infarct. The parenchymal hemorrhage measures over 4 cm. Element of subarachnoid hemorrhage is present as well. Diffuse cortical edema is present within the infarct territory. There is some mass effect with out significant midline shift. No acute left-sided infarct is present. Mild atrophy is present. Chronic bilateral subdural hematomas are again noted. The internal auditory canals are within normal limits. The brainstem and cerebellum are within normal limits. Vascular: Flow is present in the major intracranial arteries. Artifact from intracranial stent within inferior left M2 segment noted. Skull and upper cervical spine: Craniocervical  junction is within normal limits. Slight anterolisthesis is present at C2-3. Marrow signal is normal. Sinuses/Orbits: Mild mucosal thickening is scattered throughout the ethmoid air cells and frontal sinuses bilaterally. No significant mastoid effusion is present. No fluid levels are present. Bilateral lens replacements are noted. Globes and orbits are otherwise unremarkable. IMPRESSION: 1. Large acute/subacute left-sided infarct with parenchymal hemorrhage measuring over 4 cm. PH2 hemorrhage. 2. Element of acute right subarachnoid hemorrhage. 3. Chronic bilateral subdural hematomas. 4. No significant midline shift. Electronically Signed   By: San Morelle M.D.   On: 09/10/2020 12:38   MR BRAIN WO CONTRAST  Result Date: 09/10/2020 CLINICAL DATA:  Acute onset of difficulty standing and walking. Right facial droop. Status post revascularization of right inferior M2 M3 occlusion. Status post balloon angioplasty of right internal carotid artery. Placement intracranial stent. EXAM: MRI HEAD WITHOUT CONTRAST TECHNIQUE: Multiplanar, multiecho pulse sequences of the brain and surrounding structures were obtained without intravenous contrast. COMPARISON:  CT head without contrast, CT perfusion head and CTA head 09/09/2020. CT head without contrast 09/10/2020. FINDINGS: Brain: The diffusion-weighted images demonstrate an acute/subacute infarct similar in size to the predicted area of ischemia on the CT perfusion study. Parenchymal hemorrhage is noted throughout the bulk of the infarct. The parenchymal hemorrhage measures over 4 cm. Element of subarachnoid hemorrhage is present as well. Diffuse cortical edema is present within the infarct territory. There is some mass effect with out significant midline shift. No acute left-sided infarct is present. Mild atrophy is present. Chronic bilateral subdural hematomas are again noted. The internal auditory canals are within normal limits. The brainstem and cerebellum are  within normal limits. Vascular: Flow is present in the major intracranial arteries. Artifact from intracranial stent within inferior left M2 segment noted. Skull and upper cervical spine: Craniocervical junction is within normal limits. Slight anterolisthesis is present at C2-3. Marrow signal is normal. Sinuses/Orbits: Mild mucosal thickening is scattered throughout the ethmoid air cells and frontal sinuses bilaterally. No significant mastoid effusion is present. No fluid levels are present. Bilateral lens replacements are noted. Globes and orbits are otherwise unremarkable. IMPRESSION: 1. Large acute/subacute left-sided infarct with parenchymal hemorrhage measuring over 4 cm. PH2 hemorrhage. 2. Element of acute right subarachnoid hemorrhage. 3. Chronic bilateral subdural hematomas. 4. No significant midline shift. Electronically Signed   By: San Morelle M.D.   On: 09/10/2020 12:38   CT CEREBRAL PERFUSION W CONTRAST  Result Date: 09/09/2020 CLINICAL DATA:  79 year old male code stroke. Neglect. Right parietal lobe implicated. History of unresolved subdural hematomas. EXAM: CT ANGIOGRAPHY HEAD AND NECK CT PERFUSION BRAIN TECHNIQUE: Multidetector CT imaging of the head and neck was performed using the standard protocol during bolus administration of  intravenous contrast. Multiplanar CT image reconstructions and MIPs were obtained to evaluate the vascular anatomy. Carotid stenosis measurements (when applicable) are obtained utilizing NASCET criteria, using the distal internal carotid diameter as the denominator. Multiphase CT imaging of the brain was performed following IV bolus contrast injection. Subsequent parametric perfusion maps were calculated using RAPID software. CONTRAST:  153m OMNIPAQUE IOHEXOL 350 MG/ML SOLN COMPARISON:  Plain head CT 1105 hours today reported separately. Intracranial MRA 06/24/2017. FINDINGS: CT Brain Perfusion Findings: ASPECTS: 10 CBF (<30%) Volume: 158mPerfusion  (Tmax>6.0s) volume: 4137m Hypoperfusion index 0.1 Mismatch Volume: 32m58mfarction Location:Posterior right MCA territory. CTA NECK Skeleton: Previous left superior frontal craniotomy. Chronic bilateral calvarium burr holes. No acute osseous abnormality identified. Cervical spine degeneration. Upper chest: Partially visible left chest cardiac pacemaker. Otherwise negative. Other neck: Mild motion artifact. No acute findings identified in the neck. Aortic arch: 3 vessel arch configuration. Relatively mild Calcified aortic atherosclerosis. Right carotid system: Mild calcified plaque of the brachiocephalic artery without stenosis. Normal right CCA origin. Intermittent moderate calcified plaque in the right CCA proximal to the bifurcation, and at the bifurcation a radiographic string sign occurs at the ICA origin related to very bulky calcified plaque on series 5, image 108. Additional bulky plaque through the distal bulb, tandem stenosis on series 5, image 103. But the vessel remains patent. No additional stenosis to the skull base. Left carotid system: Mild left CCA origin but similar moderate left CCA and left carotid bifurcation through ICA bulb calcified plaque. On this side subsequent stenosis is less than 50 % with respect to the distal vessel. Vertebral arteries: Mild to moderate proximal right subclavian calcified plaque. Calcified plaque at the right vertebral artery origin with mild to moderate stenosis. Patent right vertebral then to the skull base. Mild proximal left subclavian and left vertebral artery origin calcified plaque without stenosis. Tortuous left V1 segment. Codominant left vertebral is otherwise normal to the skull base. CTA HEAD Posterior circulation: Negative distal vertebral arteries and basilar. The left V4 is mildly dominant. Normal PICA origins. Patent SCA and PCA origins. Small posterior communicating arteries. Mild left PCA P1 irregularity. Bilateral PCA branches are within normal  limits. Anterior circulation: Both ICA siphons are patent. On the left cavernous segment calcified plaque results in no stenosis. Normal left ophthalmic and posterior communicating artery origins. On the right cavernous segment calcified plaque results in no significant stenosis. Patent carotid termini, MCA and ACA origins. Dominant left A1. 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 Otherwise the bilateral ACA branches are within normal limits. Left MCA M1 segment and bifurcation are patent. Left MCA branches appears table since 2018. Right MCA M1 and bifurcation remain patent. Dominant posterior right MCA M2 branch is occluded at the level of punctate calcification on series 12, image 14. This is at a bifurcation where the superior branch remains patent. But the parent vessel is occluded. This calcification is new compared to last year. Venous sinuses: Patent. Anatomic variants: Dominant left ACA A1. Review of the MIP images confirms the above findings IMPRESSION: 1. Positive for emergent occlusion of a posterior Right MCA distal M2 branch at its bifurcation. Embolized calcification there is new since last year (series 12, image 14). 2. CT Perfusion detects a small infarct core of 13 mL with regional penumbra of 41 mL (28 mL mismatch). 3. Superimposed very bulky calcified plaque at the Right ICA origin with tandem HIGH-GRADE STENOSIS, including Radiographic String  Sign (series 7, image 216). 4. The above were reviewed in person with Dr. Roland Rack on 09/09/2020 at 1128 hours. 5. 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 6. Bulky Left Carotid bifurcation and proximal left ICA calcified plaque without significant stenosis. 7. Up to moderate Right Vertebral Artery origin stenosis. Electronically Signed   By: Genevie Ann M.D.   On: 09/09/2020 11:46   DG CHEST PORT 1 VIEW  Result Date:  09/12/2020 CLINICAL DATA:  Respiratory failure.  Hypoxia. EXAM: PORTABLE CHEST 1 VIEW COMPARISON:  Chest x-ray 05/15/2020. FINDINGS: Feeding tube noted with tip below left hemidiaphragm. Cardiac pacer noted in stable position. Mitral annular calcification. Borderline cardiomegaly and pulmonary venous congestion. Low lung volumes with mild bibasilar atelectasis. Mild right base infiltrate cannot be excluded. Tiny bilateral pleural effusions cannot be excluded. No pneumothorax. Degenerative changes thoracic spine. Postsurgical changes both shoulders. IMPRESSION: 1.  Feeding tube noted with tip below left hemidiaphragm. 2. Cardiac pacer stable position. Borderline cardiomegaly pulmonary venous congestion. 3. Mild bibasilar atelectasis. Mild right base infiltrate cannot be excluded. Tiny bilateral pleural effusions cannot be excluded. Electronically Signed   By: Marcello Moores  Register   On: 09/12/2020 16:34   DG Abd Portable 1V  Result Date: 09/09/2020 CLINICAL DATA:  Nasogastric tube placement EXAM: PORTABLE ABDOMEN - 1 VIEW COMPARISON:  None. FINDINGS: No nasogastric tube is identified within the visualized abdomen. The right flank and pelvis are excluded from view. The visualized abdominal gas pattern is unremarkable. IMPRESSION: Nasogastric tube not identified within the visualized distal esophagus and abdomen. Chest radiograph may be helpful to identify the location of a malpositioned catheter. These results will be called to the ordering clinician or representative by the Radiologist Assistant, and communication documented in the PACS or [employer]. Electronically Signed   By: Fidela Salisbury MD   On: 09/09/2020 23:49   EEG adult  Result Date: 09/11/2020 Lora Havens, MD     09/11/2020  1:10 PM Patient Name: Shailen Thielen MRN: [id number] Epilepsy Attending: Lora Havens Referring Physician/Provider: Tollie Eth, NP Date: 09/11/2020 Duration: 24.37 mins Patient history: 80 year old  male with history of seizures, presented with altered mental status. EEG to evaluate for seizures. Level of alertness: Awake,asleep AEDs during EEG study: Keppra Technical aspects: This EEG study was done with scalp electrodes positioned according to the 10-20 International system of electrode placement. Electrical activity was acquired at a sampling rate of _0  and reviewed with a high frequency filter of _1  and a low frequency filter of _2 . EEG data were recorded continuously and digitally stored. Description: No clear posterior dominant rhythm was seen.  Sleep was characterized by vertex waves, sleep spindles (12 to 14 Hz), maximal frontocentral region.  EEG showed continuous 5 to 8 Hz theta-alpha activity in left hemisphere admixed with 15 to 18 Hz beta activity in left central temporal region consistent with breach artifact.  There is also 3 to 5 Hz theta-delta slowing in right hemisphere. Sharp transients were seen in right> left temporal region.  Hyperventilation and photic stimulation were not performed.   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  There is also evidence of breach artifact in left centro- temporal region consistent with prior craniotomy.  Additionally there is evidence of moderate diffuse encephalopathy, nonspecific etiology.  No seizures or definite epileptiform discharges were seen throughout the recording.  If suspicion for interictal activity remains a concern, a prolonged study can be considered. Lora Havens   ECHOCARDIOGRAM COMPLETE  Result Date: 09/09/2020    ECHOCARDIOGRAM REPORT   Patient Name:   ANNIE ROSEBOOM [facility] Date of Exam: 09/09/2020 Medical Rec #:  [id number]             Height:       69.0 in Accession #:    [id number]            Weight:       218.3 lb Date of  Birth:  09-25-1940             BSA:          2.144 m Patient Age:    41 years              BP:           100/45 mmHg Patient Gender: M                     HR:           70 bpm. Exam Location:  Inpatient Procedure: 2D Echo, Cardiac Doppler, Color Doppler and Intracardiac            Opacification Agent Indications:    Stroke  History:        Patient has prior history of Echocardiogram examinations, most                 recent 06/24/2017. Risk Factors:Sleep Apnea, Diabetes and Former                 Smoker. GERD.  Sonographer:    Clayton Lefort RDCS (AE) Referring Phys: [id number] [facility]  Sonographer Comments: Technically difficult study due to poor echo windows. Image acquisition challenging due to respiratory motion. IMPRESSIONS  1. Left ventricular ejection fraction, by estimation, is 65 to 70%. The left ventricle has normal function. The left ventricle has no regional wall motion abnormalities. There is severe concentric left ventricular hypertrophy. Left ventricular diastolic  parameters are consistent with Grade I diastolic dysfunction (impaired relaxation). Elevated left atrial pressure.  2. Right ventricular systolic function is normal. The right ventricular size is mildly enlarged. There is normal pulmonary artery systolic pressure.  3. The mitral valve is normal in structure. No evidence of mitral valve regurgitation. Mild mitral stenosis. The mean mitral valve gradient is 3.0 mmHg. Moderate mitral annular calcification.  4. The aortic valve is normal in structure. There is severe calcifcation of the aortic valve. There is severe thickening of the aortic valve. Aortic valve regurgitation is not visualized. Mild aortic valve stenosis. Aortic valve mean gradient measures 9.0 mmHg.  5. The inferior vena cava is normal in size with greater than 50% respiratory variability, suggesting right atrial pressure of 3 mmHg. FINDINGS  Left Ventricle: Left ventricular ejection fraction, by estimation, is 65 to 70%.  The left ventricle has normal function. The left ventricle has no regional wall motion abnormalities. Definity contrast agent was given IV to delineate the left ventricular  endocardial borders. The left ventricular internal cavity size was normal in size. There is severe concentric left ventricular hypertrophy. Left ventricular diastolic parameters are consistent with Grade I diastolic dysfunction (impaired relaxation). Elevated left atrial pressure. Right Ventricle: The right ventricular size is mildly enlarged. No increase in right ventricular wall thickness. Right ventricular systolic function is normal. There is normal pulmonary artery systolic pressure. Left Atrium: Left atrial size was  normal in size. Right Atrium: Right atrial size was normal in size. Pericardium: There is no evidence of pericardial effusion. Mitral Valve: The mitral valve is normal in structure. There is moderate thickening of the mitral valve leaflet(s). There is moderate calcification of the mitral valve leaflet(s). Moderate mitral annular calcification. No evidence of mitral valve regurgitation. Mild mitral valve stenosis. MV peak gradient, 10.9 mmHg. The mean mitral valve gradient is 3.0 mmHg. Tricuspid Valve: The tricuspid valve is normal in structure. Tricuspid valve regurgitation is mild . No evidence of tricuspid stenosis. Aortic Valve: The aortic valve is normal in structure. There is severe calcifcation of the aortic valve. There is severe thickening of the aortic valve. Aortic valve regurgitation is not visualized. Mild aortic stenosis is present. Aortic valve mean gradient measures 9.0 mmHg. Aortic valve peak gradient measures 15.7 mmHg. Aortic valve area, by VTI measures 1.81 cm. Pulmonic Valve: The pulmonic valve was normal in structure. Pulmonic valve regurgitation is not visualized. No evidence of pulmonic stenosis. Aorta: The aortic root is normal in size and structure. Venous: The inferior vena cava is normal in size with  greater than 50% respiratory variability, suggesting right atrial pressure of 3 mmHg. IAS/Shunts: No atrial level shunt detected by color flow Doppler.  LEFT VENTRICLE PLAX 2D LVIDd:         4.70 cm  Diastology LVIDs:         3.30 cm  LV e' medial:    6.42 cm/s LV PW:         1.80 cm  LV E/e' medial:  15.5 LV IVS:        1.60 cm  LV e' lateral:   6.64 cm/s LVOT diam:     2.00 cm  LV E/e' lateral: 15.0 LV SV:         71 LV SV Index:   33 LVOT Area:     3.14 cm  RIGHT VENTRICLE             IVC RV Basal diam:  3.50 cm     IVC diam: 1.60 cm RV Mid diam:    3.90 cm RV S prime:     11.70 cm/s TAPSE (M-mode): 2.4 cm LEFT ATRIUM             Index       RIGHT ATRIUM           Index LA diam:        3.60 cm 1.68 cm/m  RA Area:     17.70 cm LA Vol (A2C):   59.5 ml 27.75 ml/m RA Volume:   43.70 ml  20.38 ml/m LA Vol (A4C):   53.2 ml 24.81 ml/m LA Biplane Vol: 61.9 ml 28.87 ml/m  AORTIC VALVE AV Area (Vmax):    1.69 cm AV Area (Vmean):   1.83 cm AV Area (VTI):     1.81 cm AV Vmax:           198.33 cm/s AV Vmean:          137.000 cm/s AV VTI:            0.390 m AV Peak Grad:      15.7 mmHg AV Mean Grad:      9.0 mmHg LVOT Vmax:         107.00 cm/s LVOT Vmean:        79.800 cm/s LVOT VTI:          0.225 m LVOT/AV VTI ratio: 0.58  AORTA  Ao Root diam: 3.20 cm Ao Asc diam:  3.20 cm MITRAL VALVE MV Area (PHT): 1.89 cm     SHUNTS MV Area VTI:   1.89 cm     Systemic VTI:  0.22 m MV Peak grad:  10.9 mmHg    Systemic Diam: 2.00 cm MV Mean grad:  3.0 mmHg MV Vmax:       1.65 m/s MV Vmean:      78.7 cm/s MV Decel Time: 401 msec MV E velocity: 99.50 cm/s MV A velocity: 175.00 cm/s MV E/A ratio:  0.57 Ena Dawley MD Electronically signed by Ena Dawley MD Signature Date/Time: 09/09/2020/10:39:50 PM    Final    CUP PACEART INCLINIC DEVICE CHECK  Result Date: 08/23/2020 Pacemaker check in clinic. Normal device function. Thresholds, P wave sensing, impedances consistent with previous measurements. Device programmed to  maximize longevity. No mode switch or high ventricular rates noted. Device reprogrammed at appropriate safety margins- RV amplitude set at 2.5V_0 .79m. Histogram distribution appropriate for patient activity level. Estimated longevity 12.6 years. Patient enrolled in remote follow-up, next scheduled check 11/13/20. Patient education completed.ATrena Platt BSN,  RN  IR PERCUTANEOUS ART THROMBECTOMY/INFUSION INTRACRANIAL INC DIAG ANGIO  Result Date: 09/11/2020 INDICATION: New onset left-sided numbness, left-sided neglect and confusion. Occluded right middle cerebral artery inferior division distal M2 M3 segment on CT angiogram of the head and neck. EXAM: 1. EMERGENT LARGE VESSEL OCCLUSION THROMBOLYSIS (anterior CIRCULATION) COMPARISON:  CT angiogram of the head and neck of September 09, 2020. MEDICATIONS: Vancomycin 1 g IV antibiotic was administered within 1 hour of the procedure. ANESTHESIA/SEDATION: General anesthesia CONTRAST:  Isovue 300 approximately 90 mL FLUOROSCOPY TIME:  Fluoroscopy Time: 55 minutes 12 seconds (3749 mGy). COMPLICATIONS: None immediate. TECHNIQUE: Following a full explanation of the procedure along with the potential associated complications, an informed witnessed consent was obtained from the patient's spouse. The risks of intracranial hemorrhage of 10%, worsening neurological deficit, ventilator dependency, death and inability to revascularize were all reviewed in detail with the patient's spouse. The patient was then put under general [facility] The right groin was prepped and draped in the usual sterile fashion. Thereafter using modified Seldinger technique, transfemoral access into the right common femoral artery was obtained without difficulty. Over a 0.035 inch guidewire an 8 French 25 cm Pinnacle sheath was inserted. Through this, and also over a 0.035 inch guidewire a 5 [place] 1 catheter was advanced to the aortic arch  region and selectively positioned in the right common carotid artery and the innominate artery. FINDINGS: The innominate arteriogram demonstrates patency of the right subclavian artery. The right vertebral artery demonstrates moderate tortuosity with wide patency. The vessel is seen to opacify to the cranial skull base. The right common carotid arteriogram demonstrates a complex heterogeneous partially calcified arteriosclerotic plaque extending from the distal common carotid artery into the right external carotid artery and the right internal carotid artery just distal to the bulb. Moderate to severe narrowing is seen of the right external carotid artery. Its branches are widely opacified. The right internal carotid artery at the bulb demonstrates approximately 70% stenosis associated with a prominent ulceration along the posterior wall, and also a reach of irregular atherosclerotic plaque along the lateral aspect associated with a 50% stenosis. More distally the right internal carotid artery is seen to opacify normally to the cranial skull base. The petrous, the cavernous and the supraclinoid segments are widely patent. The right middle cerebral artery demonstrates patency  with occlusion of the inferior division of the right middle cerebral artery in the M2 M3 region with prominent area of hypoperfusion involving the anterior parietal posterior frontal regions. Venous phase is within normal limits. Hypoplastic right anterior cerebral artery is seen opacifying into the distal right anterior cerebral A2 segment. PROCEDURE: The diagnostic JB 1 catheter in the right common carotid artery was then exchanged over a 0.035 inch 300 cm Rosen exchange guidewire for an 087 balloon guide catheter which had been prepped with 50% contrast and 50% heparinized saline infusion. The guidewire was removed. Good aspiration obtained from the hub of the balloon guide catheter which was positioned just proximal to the origin of the  right internal carotid artery. Control arteriogram was then performed which demonstrated no change in the extracranial or intracranial circulation. Over a 0.014 inch standard Synchro micro guidewire with a J configuration, an 021 microcatheter was then advanced inside of an 071 36 cm Zoom aspiration catheter to the distal aspect of the balloon guide catheter. The micro guidewire and the microcatheter were advanced without event into the petrous horizontal segment. However, advancement of the Zoom guide catheter was met with resistance secondary to the irregular nature of the complex arteriosclerotic plaque in the carotid bulb region. This system was then removed. Over a 0.014 inch standard Synchro micro guidewire an 021 Trevo ProVue microcatheter was then advanced into the horizontal petrous segment. The guidewire was removed. Good aspiration obtained from the hub of the catheter. This in turn was exchanged for an 014 inch 300 cm Synchro exchange micro guidewire. The distal end of the micro guidewire was positioned in the horizontal petrous segment with a J configuration. Over the exchange micro guidewire, a 4/30 cm Viatrac 14 angioplasty balloon catheter which had been prepped with 50% contrast and 50% heparinized saline infusion and with heparinized saline infusion was advanced using the rapid exchange technique and positioned with the markers distally and proximally aligned appropriately. A control angioplasty was then performed using micro inflation syringe device via micro tubing. Balloon was expanded to just over 4 mm where it was maintained for a full 1 minute. Balloon was then deflated and retrieved and removed proximally. A control arteriogram performed through the balloon guide demonstrated significantly improved caliber and flow through the angioplastied segment. Over the exchange micro guidewire, a combination of an 021 microcatheter inside of an 055 136 cm Zoom aspiration catheter was then advanced  without difficulty to the petrous horizontal segment. The exchange micro guidewire was removed. Good aspiration obtained from the microcatheter. Over a 0.014 inch standard Synchro micro guidewire with a J configuration, the combination of the microcatheter and micro guidewire was then advanced to the distal M1 segment. Using a torque device, the inferior division was then entered followed by the microcatheter and advanced distally to just proximal to the occluded prominent branch. The Zoom aspiration catheter was then advanced into the inferior division. Thereafter, attempts were made to advance the micro guidewire through the occluded inferior division in the distal M2 segment which was met with significant resistance. A different micro guidewire was then advanced and attempts were made to advance through the occluded segment. The micro guidewire eventually was able to be advanced distal to the occlusion, however, the microcatheter met with significant resistance. Micro guidewire and the microcatheter were retrieved and removed. An 035 aspiration catheter was then advanced over an 014 inch micro guidewire distal to the 55 Zoom aspiration catheter and advanced such that the micro guidewire and the 035  Zoom was in contact with the occluded segment of the inferior division. The 55 aspiration catheter was then advanced to be contact with the occluded firm clot. Thereafter, the micro guidewire was removed as was the 35 aspiration catheter due to lack of penetration through the occluded segment. Constant aspiration was then applied over about 3-4 minutes through the 55 aspiration catheter. The 55 Zoom aspiration catheter was then retrieved more proximally until there was free aspiration. Control arteriogram performed through the Zoom aspiration catheter demonstrated no change in the occluded dominant inferior division in the distal M2 segment. At this time, in a coaxial manner and with constant heparinized saline  infusion, an 017 Headway microcatheter was then advanced over a 0.014 inch standard Synchro micro guidewire distal to the 55 Zoom aspiration catheter which was in the distal right M1 segment. The micro guidewire was gently advanced with the microcatheter this time without any difficulty through the occlusion into the distal M2 M3 junction followed by the microcatheter. The micro guidewire was removed. Good aspiration obtained from the hub of the microcatheter which was then connected to continuous heparinized saline infusion. A Tiger 17 retrieval was then advanced and deployed in the usual manner from the proximal M3 to the proximal M2 segment. The aspiration catheter was then advanced to just at the level of the occluded inferior division. Aspiration then applied through the aspiration catheter for approximately 2 minutes. The Tiger device was expanded and deflated approximately 7 times. Thereafter, the combination of the retrieval device, and the microcatheter, and the Zoom microcatheter was then retrieved and removed. A control arteriogram performed through the balloon guide which had now been advanced into the distal right into the internal carotid artery demonstrated near complete revascularization of the previously occluded inferior division M2 M3 segment with a TICI 3 revascularization. There continued to be a filling defect at the junction of the previously occluded vessel, and side-branch. This was for approximately 20 minutes with progressive slightly decreased hemodynamic flow distal to the treated vessel. It was, therefore, elected to proceed with rescue stenting. Prior to this, the patient was loaded with aspirin 81 mg and and Brilinta 180 mg via an orogastric tube. CT of the brain demonstrated mild hyperattenuation in the subarachnoid space in the sylvian fissure extending into the convexity of the posterior frontal and anterior parietal regions. The 55 Zoom aspiration catheter in combination with in  021 microcatheter over a 0.014 inch standard Synchro micro guidewire was then advanced in combination into the distal M1 segment. Micro guidewire was then advanced without difficulty through the previously revascularized branch into the distal M3 region, followed by the microcatheter. The guidewire was removed. Good aspiration obtained from the microcatheter. Which the connected to continuous heparin saline infusion. It was elected to proceed with placement of a 4 mm x 24 mm Neuroform atlas stent. This was then advanced in a coaxial manner with constant heparinized saline infusion to the distal marker on the microcatheter. The O ring on the delivery microcatheter was loosened. With slight forward gentle traction with the right hand on the delivery micro guidewire with the right hand, the microcatheter was then unsheathed deploying the stent. The delivery micro guidewire and the microcatheter was gently retrieved more proximally. A control arteriogram performed through the 55 aspiration catheter and in the distal right internal carotid artery now demonstrates significantly improved caliber and flow through the entirety of the right middle cerebral artery inferior division. A TICI 3 revascularization was maintained. The right internal carotid artery  continued to demonstrate excellent flow through the right middle and the right anterior cerebellar arteries. The balloon guide catheter was gently retrieved with the balloon guide catheter gently retrieved with the 55 aspiration catheter proximal to the right common carotid bifurcation. A control arteriogram performed through the balloon guide catheter in the right common carotid artery continued to demonstrate significantly improved caliber and flow through the previous angioplastjed segment of the right internal carotid artery at the bulb. There continued be approximately 50% stenosis. However, given the significant improved flow it was elected to proceed with stenting  at this time. Intracranially there continued be no evidence of intraluminal filling defects or of occlusions. No mass-effect was noted. The balloon guide was removed. The 8 French Pinnacle sheath was removed with successful hemostasis of the right groin puncture site with an 8 French Angio-Seal closure device. The right groin appeared soft. Distal pulses remained palpable unchanged prior to procedure. A repeat CT of the brain demonstrated marginally increased hyperattenuation in the posterior perisylvian, and the frontal parietal regions. There is no mass effect or midline shift. 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 The patient's general anesthesia was reversed and the patient was extubated without difficulty. Upon recovery, the patient was able to move all his fours adequately. He denied any headaches, nausea or vomiting. He continued to have facial asymmetry. However, his speech was relatively clear. He was then transferred to the neuro ICU for post revascularization management. IMPRESSION: Status post endovascular complete revascularization of a calcified arteriosclerotic plaque in the right inferior division of the right middle cerebral artery M2 M3 segment with 1 pass with contact aspiration, 1 pass with the Tiger 17 retrieval device and aspiration achieving a TICI 3 revascularization Placement of a rescue stent at the site of the occluded inferior division distal M2 M3 region with continued TICI 3 revascularization. PLAN: Follow-up in the clinic 2-4 weeks post discharge. Electronically Signed   By: Luanne Bras M.D.   On: 09/10/2020 11:03   CT HEAD CODE STROKE WO CONTRAST  Result Date: 09/09/2020 CLINICAL DATA:  Code stroke. Neuro deficit, acute, stroke suspected. Additional history provided: Right-sided facial droop, last known well 9:15 a.m. EXAM: CT HEAD WITHOUT CONTRAST TECHNIQUE: Contiguous axial images were obtained from the base of the skull through the  vertex without intravenous contrast. COMPARISON:  Head CT 02/08/2020. FINDINGS: Brain: Mild cerebral atrophy. Redemonstrated subdural hematomas overlying the bilateral cerebral convexities. These are unchanged in size as compared to the prior head CT of 02/08/2020 again measuring up to 9 mm in thickness bilaterally. Although intermediate to low-density, the density of the right subdural hematoma has subtly increased as compared to the prior examination which may reflect some degree of interval hemorrhage into the collection. 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 Scattered small foci of calcification within both collections. Symmetric mass effect upon the underlying cerebral hemispheres without midline shift. Ill-defined hypoattenuation within the cerebral white matter is nonspecific, but compatible with chronic small vessel ischemic disease. No acute demarcated cortical infarct. No evidence of intracranial mass. Vascular: No hyperdense vessel.  Atherosclerotic calcifications. Skull: No calvarial fracture.  Left-sided cranioplasty. Sinuses/Orbits: Partial opacification of left ethmoid air cells. Background mild bilateral ethmoid and sphenoid sinus mucosal thickening. ASPECTS [facility] Stroke Program Early CT Score) - Ganglionic level infarction (caudate, lentiform nuclei, internal capsule, insula, M1-M3 cortex): 7 - Supraganglionic infarction (M4-M6 cortex): 3 Total score (0-10 with 10 being normal): 10 Dr. Kathrynn Speed aware of these findings at 11:20  a.m. on 09/09/2020 (discussed with Dr. Lars Pinks). IMPRESSION: Redemonstrated subdural hematomas overlying the bilateral cerebral convexities. As before, these hematomas measure up to 9 mm in greatest thickness. Although intermediate to low-density, the density of the right subdural hematoma has subtly increased from the examination of 02/08/2020, suggestive of some degree of interval hemorrhage into the collection. The left  subdural hematoma remains low-density. Unchanged symmetric mass effect upon the cerebral hemispheres without midline shift. No acute demarcated cortical infarct is identified.  ASPECTS is 10. Stable generalized atrophy of the brain and chronic small vessel ischemic disease. Electronically Signed   By: Kellie Simmering DO   On: 09/09/2020 11:35   CT ANGIO HEAD CODE STROKE  Result Date: 09/09/2020 CLINICAL DATA:  80 year old male code stroke. Neglect. Right parietal lobe implicated. History of unresolved subdural hematomas. EXAM: CT ANGIOGRAPHY HEAD AND NECK CT PERFUSION BRAIN TECHNIQUE: Multidetector CT imaging of the head and neck was performed using the standard protocol during bolus administration of intravenous contrast. Multiplanar CT image reconstructions and MIPs were obtained to evaluate the vascular anatomy. Carotid stenosis measurements (when applicable) are obtained utilizing NASCET criteria, using the distal internal carotid diameter as the denominator. Multiphase CT imaging of the brain was performed following IV bolus contrast injection. Subsequent parametric perfusion maps were calculated using RAPID software. CONTRAST:  142m OMNIPAQUE IOHEXOL 350 MG/ML SOLN COMPARISON:  Plain head CT 1105 hours today reported separately. Intracranial MRA 06/24/2017. FINDINGS: CT Brain Perfusion Findings: ASPECTS: 10 CBF (<30%) Volume: 130mPerfusion (Tmax>6.0s) volume: 4166m Hypoperfusion index 0.1 Mismatch Volume: 74m17mfarction Location:Posterior right MCA territory. CTA NECK Skeleton: Previous left superior frontal craniotomy. Chronic bilateral calvarium burr holes. No acute osseous abnormality identified. Cervical spine degeneration. Upper chest: Partially visible left chest cardiac pacemaker. Otherwise negative. Other neck: Mild motion artifact. No acute findings identified in the neck. Aortic arch: 3 vessel arch configuration. Relatively mild Calcified aortic atherosclerosis. Right carotid system: Mild  calcified plaque of the brachiocephalic artery without stenosis. Normal right CCA origin. Intermittent moderate calcified plaque in the right CCA proximal to the bifurcation, and at the bifurcation a radiographic string sign occurs at the ICA origin related to very bulky calcified plaque on series 5, image 108. Additional bulky plaque through the distal bulb, tandem stenosis on series 5, image 103. But the vessel remains patent. No additional stenosis to the skull base. Left carotid system: Mild left CCA origin but similar moderate left CCA and left carotid bifurcation through ICA bulb calcified plaque. On this side subsequent stenosis is less than 50 % with respect to the distal vessel. Vertebral arteries: Mild to moderate proximal right subclavian calcified plaque. Calcified plaque at the right vertebral artery origin with mild to moderate stenosis. Patent right vertebral then to the skull base. Mild proximal left subclavian and left vertebral artery origin calcified plaque without stenosis. Tortuous left V1 segment. Codominant left vertebral is otherwise normal to the skull base. CTA HEAD Posterior circulation: Negative distal vertebral arteries and basilar. The left V4 is mildly dominant. Normal PICA origins. Patent SCA and PCA origins. Small posterior communicating arteries. Mild left PCA P1 irregularity. Bilateral PCA branches are within normal limits. Anterior circulation: Both ICA siphons are patent. On the left cavernous segment calcified plaque results in no stenosis. Normal left ophthalmic and posterior communicating artery origins. On the right cavernous segment calcified plaque results in no significant stenosis. Patent carotid termini, MCA and ACA origins. Dominant left A1. 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 Otherwise the bilateral ACA branches are within normal limits. Left MCA M1 segment and bifurcation are patent. Left  MCA branches appears table since 2018. Right MCA M1 and bifurcation remain patent. Dominant posterior right MCA M2 branch is occluded at the level of punctate calcification on series 12, image 14. This is at a bifurcation where the superior branch remains patent. But the parent vessel is occluded. This calcification is new compared to last year. Venous sinuses: Patent. Anatomic variants: Dominant left ACA A1. Review of the MIP images confirms the above findings IMPRESSION: 1. Positive for emergent occlusion of a posterior Right MCA distal M2 branch at its bifurcation. Embolized calcification there is new since last year (series 12, image 14). 2. CT Perfusion detects a small infarct core of 13 mL with regional penumbra of 41 mL (28 mL mismatch). 3. Superimposed very bulky calcified plaque at the Right ICA origin with tandem HIGH-GRADE STENOSIS, including Radiographic String Sign (series 7, image 216). 4. The above were reviewed in person with Dr. Roland Rack on 09/09/2020 at 1128 hours. 5. 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 6. Bulky Left Carotid bifurcation and proximal left ICA calcified plaque without significant stenosis. 7. Up to moderate Right Vertebral Artery origin stenosis. Electronically Signed   By: Genevie Ann M.D.   On: 09/09/2020 11:46   CT ANGIO NECK CODE STROKE  Result Date: 09/09/2020 CLINICAL DATA:  80 year old male code stroke. Neglect. Right parietal lobe implicated. History of unresolved subdural hematomas. EXAM: CT ANGIOGRAPHY HEAD AND NECK CT PERFUSION BRAIN TECHNIQUE: Multidetector CT imaging of the head and neck was performed using the standard protocol during bolus administration of intravenous contrast. Multiplanar CT image reconstructions and MIPs were obtained to evaluate the vascular anatomy. Carotid stenosis measurements (when applicable) are obtained utilizing NASCET criteria, using the distal internal carotid diameter as the  denominator. Multiphase CT imaging of the brain was performed following IV bolus contrast injection. Subsequent parametric perfusion maps were calculated using RAPID software. CONTRAST:  140m OMNIPAQUE IOHEXOL 350 MG/ML SOLN COMPARISON:  Plain head CT 1105 hours today reported separately. Intracranial MRA 06/24/2017. FINDINGS: CT Brain Perfusion Findings: ASPECTS: 10 CBF (<30%) Volume: 190mPerfusion (Tmax>6.0s) volume: 4144m Hypoperfusion index 0.1 Mismatch Volume: 72m102mfarction Location:Posterior right MCA territory. CTA NECK Skeleton: Previous left superior frontal craniotomy. Chronic bilateral calvarium burr holes. No acute osseous abnormality identified. Cervical spine degeneration. Upper chest: Partially visible left chest cardiac pacemaker. Otherwise negative. Other neck: Mild motion artifact. No acute findings identified in the neck. Aortic arch: 3 vessel arch configuration. Relatively mild Calcified aortic atherosclerosis. Right carotid system: Mild calcified plaque of the brachiocephalic artery without stenosis. Normal right CCA origin. Intermittent moderate calcified plaque in the right CCA proximal to the bifurcation, and at the bifurcation a radiographic string sign occurs at the ICA origin related to very bulky calcified plaque on series 5, image 108. Additional bulky plaque through the distal bulb, tandem stenosis on series 5, image 103. But the vessel remains patent. No additional stenosis to the skull base. Left carotid system: Mild left CCA origin but similar moderate left CCA and left carotid bifurcation through ICA bulb calcified plaque. On this side subsequent stenosis is less than 50 % with respect to the distal vessel. Vertebral arteries: Mild to moderate proximal right subclavian calcified plaque. Calcified plaque at the right vertebral artery origin with mild to moderate stenosis. Patent right vertebral then to the skull base. Mild  proximal left subclavian and left vertebral artery  origin calcified plaque without stenosis. Tortuous left V1 segment. Codominant left vertebral is otherwise normal to the skull base. CTA HEAD Posterior circulation: Negative distal vertebral arteries and basilar. The left V4 is mildly dominant. Normal PICA origins. Patent SCA and PCA origins. Small posterior communicating arteries. Mild left PCA P1 irregularity. Bilateral PCA branches are within normal limits. Anterior circulation: Both ICA siphons are patent. On the left cavernous segment calcified plaque results in no stenosis. Normal left ophthalmic and posterior communicating artery origins. On the right cavernous segment calcified plaque results in no significant stenosis. Patent carotid termini, MCA and ACA origins. Dominant left A1. 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 Otherwise the bilateral ACA branches are within normal limits. Left MCA M1 segment and bifurcation are patent. Left MCA branches appears table since 2018. Right MCA M1 and bifurcation remain patent. Dominant posterior right MCA M2 branch is occluded at the level of punctate calcification on series 12, image 14. This is at a bifurcation where the superior branch remains patent. But the parent vessel is occluded. This calcification is new compared to last year. Venous sinuses: Patent. Anatomic variants: Dominant left ACA A1. Review of the MIP images confirms the above findings IMPRESSION: 1. Positive for emergent occlusion of a posterior Right MCA distal M2 branch at its bifurcation. Embolized calcification there is new since last year (series 12, image 14). 2. CT Perfusion detects a small infarct core of 13 mL with regional penumbra of 41 mL (28 mL mismatch). 3. Superimposed very bulky calcified plaque at the Right ICA origin with tandem HIGH-GRADE STENOSIS, including Radiographic String Sign (series 7, image 216). 4. The above were reviewed in person with Dr. Roland Rack on 09/09/2020 at 1128 hours. 5. 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 6. Bulky Left Carotid bifurcation and proximal left ICA calcified plaque without significant stenosis. 7. Up to moderate Right Vertebral Artery origin stenosis. Electronically Signed   By: Genevie Ann M.D.   On: 09/09/2020 11:46       HISTORY OF PRESENT ILLNESS Patient was brought to [facility] as a code stroke via EMS. LKW was 0915 at rehab when he began to have difficulty standing and walking and was noted to have a right facial droop. Also, c/o some dizziness, but no HA or vision changes. Patient states he goes to rehab for balance issues. He denied any weakness in his extremities or numbness/tingling. CBG in field was 178 and BP 144/82.   CT showed - Positive for emergent occlusion of a posterior Right MCA distal M2 branch at its bifurcation. Embolized calcification there is new since last year (series 12, image 14). CT Perfusion detects a small infarct core of 13 mL with regional penumbra of 41 mL (28 mL mismatch). Superimposed very bulky calcified plaque at the Right ICA origin with tandem HIGH-GRADE STENOSIS, including Radiographic String Sign (series 7, image 216). The above were reviewed in person with Dr. Roland Rack on 09/09/2020 at 1128 hours. 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 Bulky Left Carotid bifurcation and proximal left ICA calcified plaque without significant stenosis.Up to moderate Right Vertebral Artery origin stenosis. MR Angio showed - Large acute/subacute left-sided infarct with parenchymal hemorrhage measuring over 4 cm. PH2 hemorrhage. Element of acute right subarachnoid hemorrhage. Chronic bilateral subdural hematomas. No significant midline shift.  He was taken by IR and  had Revascularization of occluded dominant right MCA inferior division of distal M2/3 with placement of stent and right ICA baloon angioplasty.    HOSPITAL COURSE Patient presented to [facility] on 2/28 via EMS due to difficulty standing and walking and having a Right facial droop at Rehab that morning. CT showed showed right MCA M2 branch occlusion. MR angio showed Large acute/subacute left-sided infarct with parenchymal hemorrhage measuring over 4 cm. PH2 hemorrhage. Due to his history of SDH tPA was not given. He was taken by IR and they preformed a revascularization of occluded dominant right MCA inferior division of distal M2/3 with placement of stent and right ICA baloon angioplasty. He developed Cerebral Edema requiring 3% NaCl and Dysphagia requiring a CorTrak placement, which was eventually weaned off of. Due to a Seizure history he was on Keppra but this was reduced to due to somnolence. PT/OT recommended CIR.  In the admission, he required treatment for aspiration pneumonia and respiratory failure.  His diabetes type 2, which was uncontrolled, was also treated during this admission.  He has chronic kidney disease 3B, which worsened during admission and was treated with fluids and is now at baseline.  He required aggressive blood pressure management due to revascularization of the right carotid and is now on his home medications with adequate blood pressure control.  Goal blood pressure should be kept normotensive.  He is currently on dual antiplatelets-with the knowledge that he had had bilateral subdural hematomas, which would otherwise have precluded him from aggressive antiplatelet or anticoagulant therapy, but given his recent right carotid stenting, he will continue on dual antiplatelet therapy.  He will follow-up with outpatient neurology in outpatient neuro interventional radiology as below.  On day of discharge he reports no pain. He is excited to discharge to Rehab. Encouraged him to work hard while there. His neuro exam is stable from yesterday. He has no concerns at present. He was discharged to inpatient Rehab in [place].    DISCHARGE EXAM Blood pressure (!) 133/54, pulse 68, temperature 98.4 F (36.9 C), temperature source Oral, resp. rate 20, height _0  (1.753 m), weight 94.9 kg, SpO2 97 %.  General: alert and awake, elderly caucasian male, no apparent distress Lungs: Symmetrical Chest rise, no labored breathing Cardio: Regular Rate and Rhythm Abdomen: Soft, non-tender Neuro: Alert, oriented, thought content appropriate.  Mild dysarthria/hypophonia.  Speech fluent without evidence of aphasia.  Able to follow all simple commands without difficulty. Cranial Nerves: II:  Visual field on Right grossly normal, neglects objects on the left, pupils equal, round, reactive to light and accommodation III,IV, VI: ptosis not present, extra-ocular motions intact bilaterally V,VII: smile symmetric, facial light touch sensation normal bilaterally VIII: hearing normal bilaterally IX,X: uvula rises symmetrically XI: bilateral shoulder shrug XII: midline tongue extension without atrophy or fasciculations  Motor: drift present of LUE Right : Upper extremity   4+/5    Left:     Upper extremity   3/5  Lower extremity   5/5     Lower extremity   4+/5 Tone and bulk:normal tone throughout; no atrophy noted Sensory: light touch intact throughout, bilaterally Gait: deferred  Discharge Diet      Diet   DIET - DYS 1 Room service appropriate? Yes; Fluid consistency: Nectar Thick   liquids  DISCHARGE PLAN  Disposition:  Transfer to [place] for ongoing PT, OT and ST  aspirin 81 mg daily and Brilinta (ticagrelor) 45 mg BID for secondary stroke prevention.  Recommend ongoing stroke risk  factor control by Primary Care Physician at time of discharge from inpatient rehabilitation.  Follow-up PCP Janith Lima, MD in 2 weeks following discharge from rehab.  Follow-up in [facility] in 4 weeks following discharge from rehab, office to schedule an appointment.   Follow  up with Dr. Estanislado Pandy - Neuro IR in 4-6 weeks  35 minutes were spent preparing discharge.  Attending Neurohospitalist Addendum Patient seen and examined with APP/Resident. Agree with the history and physical as documented above. Agree with the plan as documented, which I helped formulate. I have independently reviewed the chart, obtained history, review of systems and examined the patient.I have personally reviewed pertinent head/neck/spine imaging (CT/MRI). Please feel free to call with any questions. --- Amie Portland, MD [facility] Pager: [phone]  If 7pm to 7am, please call on call as listed on AMION.

## 2020-09-18 NOTE — Progress Notes (Addendum)
Interim progress note Patient was ready for discharge this morning but sometime during the course of the morning, started complaining of shortness of breath.  Sats 94% on 2 L. Keeps falling asleep. On examination, has poor attention concentration, drowsy, opens eyes to voice. Expiratory wheezing on chest auscultation.  Stat chest x-ray ordered. Continue supplemental oxygen Will keep in the hospital another day. Will plan discharge for tomorrow.   Addendum CXR-no active disease. Monitor clinically. Cr 1.4 - at baseline CBC also at baseline. Check ABG Use CPAP at night. Repeat CTH  Will follow  -- Amie Portland, MD Stroke Neurology Pager: [phone]   Addendum ABG    Component Value Date/Time   PHART 7.425 09/18/2020 1445   PCO2ART 49.7 (H) 09/18/2020 1445   PO2ART 126 (H) 09/18/2020 1445   HCO3 32.1 (H) 09/18/2020 1445   TCO2 27 09/09/2020 1103   O2SAT 98.6 09/18/2020 1445    CTH: Pending at this time.   -- Amie Portland, MD Stroke Neurology Pager: [phone]    Addendum Reexamined the patient Still remains lethargic with some dysarthria and poor attention concentration.  Able to follow commands.  Weaker on the left-otherwise remains unchanged from before. Family very worried about him not making full recovery given the prior subdurals and now the right-sided ischemic infarcts. I had a detailed discussion about CODE STATUS-they want him to be a DNR. I have changed the CODE STATUS to DO NOT RESUSCITATE in the chart Head CT is pending at this time. Chest x-ray remains unremarkable ABG also not very impressive for any explanation on why he would be more lethargic today compared to yesterday.   -- Amie Portland, MD Stroke Neurology Pager: [phone]

## 2020-09-18 NOTE — Progress Notes (Signed)
STROKE TEAM PROGRESS NOTE   INTERVAL HISTORY  Patient was getting ready for discharge today.  Discharge summary was done which was later deleted. Became more lethargic in the afternoon. Initial exam by the resident documented below Detailed exam by the attending later also documented below.            Vitals:   09/16/20 2316 09/17/20 0011 09/17/20 0318 09/17/20 0853  BP: (!) 157/68 (!) 145/66 (!) 149/73 (!) 160/69  Pulse:  82 81 72  Resp:  20 19 18   Temp:  (!) 97.5 F (36.4 C) 98.4 F (36.9 C) 98.5 F (36.9 C)  TempSrc:  Oral Axillary Oral  SpO2:  95% 95% 93%  Weight:      Height:       CBC:  Last Labs       Recent Labs  Lab 09/16/20 0340 09/17/20 0349  WBC 6.2 6.6  HGB 9.0* 8.6*  HCT 29.1* 28.3*  MCV 87.7 85.5  PLT 129* 122*     Basic Metabolic Panel:  Last Labs   Recent Labs  Lab 09/13/20 0625 09/13/20 1026 09/13/20 1627 09/13/20 2201 09/16/20 0340 09/17/20 0349  NA 152*   < > 155*   < > 142 137  K 4.1  --   --    < > 4.1 4.0  CL 117*  --   --    < > 109 100  CO2 25  --   --    < > 25 27  GLUCOSE 238*  --   --    < > 211* 233*  BUN 33*  --   --    < > 38* 38*  CREATININE 1.46*  --   --    < > 1.35* 1.31*  CALCIUM 9.0  --   --    < > 8.6* 8.7*  MG 1.8  --  1.7  --   --   --   PHOS 3.4  --  2.9  --   --   --    < > = values in this interval not displayed.     Lipid Panel:  Last Labs      Recent Labs  Lab 09/13/20 0625  TRIG 77     HgbA1c:  Last Labs   No results for input(s): HGBA1C in the last 168 hours.   Urine Drug Screen: pending  Result Date 09/10/2020 Mri brain/ MRA head 1. Large acute/subacute left-sided infarct with parenchymal hemorrhage measuring over 4 cm. PH2 hemorrhage. 2. Element of acute right subarachnoid hemorrhage. 3. Chronic bilateral subdural hematomas. 4. No significant midline shift. IMAGING past 24 hours CT HEAD WO CONTRAST  Result Date: 09/10/2020 IMPRESSION:  1. Increased subarachnoid  hemorrhage/contrast around the right sylvian fissure. There is also some interval parenchymal hemorrhage in the infarct territory with small volume intraventricular extension. No hydrocephalus or midline shift.  2. Right perisylvian cortical infarct correlating with affected vessel and the perfusion maps.  3. Chronic subdural hematomas.   DG Abd Portable 1V  Result Date: 09/09/2020 Nasogastric tube not identified within the visualized distal esophagus and abdomen. Chest radiograph may be helpful to identify the location of a malpositioned catheter.   Result Date: 09/09/2020 ECHOCARDIOGRAM REPORT   1. Left ventricular ejection fraction, by estimation, is 65 to 70%. The left ventricle has normal function. The left ventricle has no regional wall motion abnormalities. There is severe concentric left ventricular hypertrophy. Left ventricular diastolic  parameters are consistent with Grade I diastolic  dysfunction (impaired relaxation). Elevated left atrial pressure.   2. Right ventricular systolic function is normal. The right ventricular size is mildly enlarged. There is normal pulmonary artery systolic pressure.   3. The mitral valve is normal in structure. No evidence of mitral valve regurgitation. Mild mitral stenosis. The mean mitral valve gradient is 3.0 mmHg. Moderate mitral annular calcification.   4. The aortic valve is normal in structure. There is severe calcifcation of the aortic valve. There is severe thickening of the aortic valve. Aortic valve regurgitation is not visualized. Mild aortic valve stenosis. Aortic valve mean gradient measures 9.0 mmHg.   5. The inferior vena cava is normal in size with greater than 50% respiratory variability, suggesting right atrial pressure of 3 mmHg.   CT ANGIO Head/neck CT perfusion CODE STROKE Result Date: 09/09/2020  IMPRESSION:  1. Positive for emergent occlusion of a posterior Right MCA distal M2 branch at its bifurcation. Embolized calcification  there is new since last year (series 12, image 14).  2. CT Perfusion detects a small infarct core of 13 mL with regional penumbra of 41 mL (28 mL mismatch).  3. Superimposed very bulky calcified plaque at the Right ICA origin with tandem HIGH-GRADE STENOSIS, including Radiographic String Sign (series 7, image 216).  4. 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  5. Bulky Left Carotid bifurcation and proximal left ICA calcified plaque without significant stenosis.  6. Up to moderate Right Vertebral Artery origin stenosis.    PHYSICAL EXAM Initial exam noted by the resident: General: alert and awake,elderly caucasian male, no apparent distress Lungs: Symmetrical Chest rise, no labored breathing Cardio: Regular Rate and Rhythm Abdomen: Soft, non-tender Neuro: Alert, oriented, thought content appropriate.Mild dysarthria/hypophonia.Speech fluent without evidence of aphasia. Able to follow all simplecommands without difficulty. Cranial Nerves: II: Visual field on Rightgrossly normal, neglects objects on the left, pupils equal, round, reactive to light and accommodation III,IV, VI: ptosis not present, extra-ocular motions intact bilaterally V,VII: smile symmetric, facial light touch sensation normal bilaterally VIII: hearing normal bilaterally IX,X: uvula rises symmetrically XI: bilateral shoulder shrug XII: midline tongue extension without atrophy or fasciculations  Motor:drift present of LUE Right :Upper extremity4+/5Left: Upper extremity 3/5 Lower extremity 5/5Lower extremity 4+/5 Tone and bulk:normal tone throughout; no atrophy noted Sensory: light touch intact throughout, bilaterally Gait:deferred   Afternoon exam by the attending: Neurological exam He was very drowsy and difficult to arouse. He would arouse  and answer questions but kept falling asleep. Had dysarthria Difficulty following simple commands due to inattention Cranial nerves: Pupils equal round reactive to light, left-sided simultanagnosia, no gaze restriction, symmetric smile. Motor exam: He is able to raise the right side much easily compared to the left but both upper and both lower extremities fall to the bed due to his inattentiveness. This was much different from the prior exam in the morning. Sensory exam: Intact to light touch and noxious stimulation throughout   ASSESSMENT/PLAN Mr. Carson Bogden is a 80 y.o. 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  Was found to have a right MCA M2 branch occlusion.  .   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 ASPECTS is 10.    CTA head cclusion of a posterior Right MCA distal M2 branch at its bifurcation.  CTA neck right ICA origin with tandem HIGH-GRADE STENOSIS  CT perfusion small infarct core of 13 mL with regional penumbra of 41 mL (28 mL mismatch).   MRI/MRA h: Large acute/subacute left-sided infarct with parenchymal hemorrhage measuring over 4 cm. PH2 hemorrhage. Element of acute right subarachnoid hemorrhage. Chronic bilateral subdural hematomas.  2D Echo EF 65-70%, severe LVH, Grade I diastolic dysfunction  LDL 29  HgbA1c 7.8  VTE prophylaxis - SCDs, Heparin 5000units q 8hr  No antithrombotic prior to admission, now on aspirin 81 mg daily and ticagrelor 45mg  daily (reduced secondary to PH2 hemorrhage)  Therapy recommendations:   CIR-> wife requested [facility] for inpatient rehab  Disposition: Accepted to the facility for discharge today but became more somnolent.    Further work-up-ABG, CT head, labs.  See follow-up progress note.  Subarachnoid Hemorrhage  S/p Thrombectomy, stent placement distal M2, balloon angioplasty  CT head 3/1: Increased subarachnoid hemorrhage the right sylvian fissure.    CT head 3/3: Related hemorrhage is decreasing.  SBP goal changed to <160  Cerebral edema  Hypertonic Saline ->off  Allow gradual Na trending down  CT repeat 3/3 no visible progression  Na 154->150->150->151->142->137  Dysphagia  Improving, Likely due to stroke  Now on dys1 and nectar thick  Calorie count yesterday.  Patient eating approximately 75% of his meals.  remove cortrak today  D/C nocturnal TF to encourage po intake  Seizure history  Previously on Keppra due to seizures but eventually weaned off  Keppra decreased 3/2 to 500mg  bid for prophylaxis due to somnolence  EEG (3/2) suggestive of cortical dysfunction in right hemisphere and moderate diffuse encephalopathy.  Chronic b/l SDH  CT head and MRI brain showed b/l subacute to chronic SDH  Did not receive tPA  Now on DAPT with half dose of brilinta  Stable on repeat CTs  Acute respiratory failure with hypoxia, improving Aspiration event  short course of cefepime for aspiration -> switched to Augmentin, 5 day course, last day today  afebrile  aspiration precautions   SLP   CCM signed off  Hypertension  Home meds:  Amlodipine 10mg  daily, Cadura 2mg  at bedtime, Micardis 20mg  daily  Currently on Amlodipine 10mg  daily and Cardura 2mg  qHS  Clonodine patch added 3/2  SBP goal <160 in the setting of SAH, ICH  hydralazine, labetolol prn  Long-term BP goal normotensive  Hyperlipidemia  Home meds: atorvastatin 20 daily  LDL 29, goal < 70  Started on atorvastatin 10mg  daily - no high intensity given low  LDL  Continue statin at discharge  Diabetes type II Uncontrolled  Home meds:  Glipizide 10mg  daily, glargine 10units daily  HgbA1c 7.8, goal < 7.0  CBGs  SSI, glucose range 135-233  lantus 5 units qHS, Novolog 2units q 4 hours   consulted diabetes coordinator for new recommendations  CKD IIIb  Likely due to contrast load for imaging/IR and dehydration  Creatinine level 1.98 -> 1.85->1.47->1.46->1.45->1.35->1.31  On TF  BMP daily  Other Stroke Risk Factors  Advanced Age >/= 45   Obesity, Body mass index is 30.86 kg/m., BMI >/= 30 associated with increased stroke risk, recommend weight loss, diet and exercise as appropriate   Coronary artery disease AV block, Mobitz 2, pacer  Obstructive sleep apnea, CPAP at home  Other Active  Problems  Anxiety/depression: prozac  GERD: protonix  Anemia: iron deficient and B12 deficient  Constipation: senna  OSA: ok to use CPAP from home  Hospital day #8 -- Amie Portland, MD Stroke Neurology Pager: [phone]

## 2020-09-19 ENCOUNTER — Other Ambulatory Visit: Payer: Self-pay | Admitting: Internal Medicine

## 2020-09-19 DIAGNOSIS — J9602 Acute respiratory failure with hypercapnia: Secondary | ICD-10-CM

## 2020-09-19 LAB — MAGNESIUM: Magnesium: 2 mg/dL (ref 1.7–2.4)

## 2020-09-19 LAB — HEPATIC FUNCTION PANEL
ALT: 78 U/L — ABNORMAL HIGH (ref 0–44)
AST: 52 U/L — ABNORMAL HIGH (ref 15–41)
Albumin: 2.7 g/dL — ABNORMAL LOW (ref 3.5–5.0)
Alkaline Phosphatase: 75 U/L (ref 38–126)
Bilirubin, Direct: 0.1 mg/dL (ref 0.0–0.2)
Indirect Bilirubin: 0.5 mg/dL (ref 0.3–0.9)
Total Bilirubin: 0.6 mg/dL (ref 0.3–1.2)
Total Protein: 5.5 g/dL — ABNORMAL LOW (ref 6.5–8.1)

## 2020-09-19 LAB — CBC
HCT: 29.4 % — ABNORMAL LOW (ref 39.0–52.0)
Hemoglobin: 9.4 g/dL — ABNORMAL LOW (ref 13.0–17.0)
MCH: 26.7 pg (ref 26.0–34.0)
MCHC: 32 g/dL (ref 30.0–36.0)
MCV: 83.5 fL (ref 80.0–100.0)
Platelets: 122 10*3/uL — ABNORMAL LOW (ref 150–400)
RBC: 3.52 MIL/uL — ABNORMAL LOW (ref 4.22–5.81)
RDW: 13.2 % (ref 11.5–15.5)
WBC: 6.2 10*3/uL (ref 4.0–10.5)
nRBC: 0 % (ref 0.0–0.2)

## 2020-09-19 LAB — GLUCOSE, CAPILLARY
Glucose-Capillary: 168 mg/dL — ABNORMAL HIGH (ref 70–99)
Glucose-Capillary: 291 mg/dL — ABNORMAL HIGH (ref 70–99)
Glucose-Capillary: 287 mg/dL — ABNORMAL HIGH (ref 70–99)
Glucose-Capillary: 292 mg/dL — ABNORMAL HIGH (ref 70–99)
Glucose-Capillary: 236 mg/dL — ABNORMAL HIGH (ref 70–99)

## 2020-09-19 LAB — BASIC METABOLIC PANEL
Anion gap: 6 (ref 5–15)
BUN: 34 mg/dL — ABNORMAL HIGH (ref 8–23)
CO2: 32 mmol/L (ref 22–32)
Calcium: 8.8 mg/dL — ABNORMAL LOW (ref 8.9–10.3)
Chloride: 99 mmol/L (ref 98–111)
Creatinine, Ser: 1.44 mg/dL — ABNORMAL HIGH (ref 0.61–1.24)
GFR, Estimated: 49 mL/min — ABNORMAL LOW (ref >60)
Glucose, Bld: 157 mg/dL — ABNORMAL HIGH (ref 70–99)
Potassium: 4.7 mmol/L (ref 3.5–5.1)
Sodium: 137 mmol/L (ref 135–145)

## 2020-09-19 LAB — AMMONIA: Ammonia: 24 umol/L (ref 9–35)

## 2020-09-19 LAB — PHOSPHORUS: Phosphorus: 4 mg/dL (ref 2.5–4.6)

## 2020-09-19 NOTE — Progress Notes (Addendum)
STROKE TEAM PROGRESS NOTE   INTERVAL HISTORY Yesterday afternoon he had an episode of hypoxemia, changing level of consciousness, and changed neuro exam.  Stat CT head showed - No change in right posterior MCA infarct with hemorrhagic transformation. No new area of hemorrhage. Right MCA branch stent unchanged.  Today he is alert and awake. He states that he has no SOB or pain. He reports that he is doing well. He cannot explain why he was so drowsy yesterday.   Given no acute changes on CT and reassuring labs will plan for DC to CIR tomorrow pending any recommendations by Hospitalist consult. No other changes at this time.  Vitals:   09/19/20 0418 09/19/20 0419 09/19/20 0448 09/19/20 0746  BP: 137/68   (!) 139/59  Pulse: 67   64  Resp: 18   18  Temp: 97.7 F (36.5 C)   97.7 F (36.5 C)  TempSrc: Oral   Oral  SpO2: (!) 89% 98%  99%  Weight:   93 kg   Height:       CBC:  Recent Labs  Lab 09/18/20 0431 09/19/20 0413  WBC 7.2 6.2  HGB 9.3* 9.4*  HCT 29.5* 29.4*  MCV 83.8 83.5  PLT 129* 025*   Basic Metabolic Panel:  Recent Labs  Lab 09/13/20 0625 09/13/20 1026 09/13/20 1627 09/13/20 2201 09/18/20 0431 09/19/20 0413  NA 152*   < > 155*   < > 138 137  K 4.1  --   --    < > 4.3 4.7  CL 117*  --   --    < > 100 99  CO2 25  --   --    < > 29 32  GLUCOSE 238*  --   --    < > 130* 157*  BUN 33*  --   --    < > 34* 34*  CREATININE 1.46*  --   --    < > 1.42* 1.44*  CALCIUM 9.0  --   --    < > 8.8* 8.8*  MG 1.8  --  1.7  --   --   --   PHOS 3.4  --  2.9  --   --   --    < > = values in this interval not displayed.   Lipid Panel:  Recent Labs  Lab 09/13/20 0625  TRIG 77   HgbA1c: No results for input(s): HGBA1C in the last 168 hours. Urine Drug Screen: No results for input(s): LABOPIA, COCAINSCRNUR, LABBENZ, AMPHETMU, THCU, LABBARB in the last 168 hours.  Alcohol Level No results for input(s): ETH in the last 168 hours.  IMAGING past 24 hours DG Chest 1  View  Result Date: 09/18/2020 CLINICAL DATA:  SOB EXAM: CHEST  1 VIEW COMPARISON:  09/12/2020 FINDINGS: Bibasilar atelectasis. No focal consolidation. No pleural effusion or pneumothorax. Heart and mediastinal contours are unremarkable. Dual lead pacemaker. No acute osseous abnormality. IMPRESSION: No active disease. Bibasilar atelectasis. Electronically Signed   By: Kathreen Devoid   On: 09/18/2020 13:54   CT HEAD WO CONTRAST  Result Date: 09/18/2020 CLINICAL DATA:  Stroke.  Mental status change EXAM: CT HEAD WITHOUT CONTRAST TECHNIQUE: Contiguous axial images were obtained from the base of the skull through the vertex without intravenous contrast. COMPARISON:  CT head 09/12/2020 FINDINGS: Brain: Large infarct right posterior MCA territory is unchanged in size. Hemorrhagic transformation within this infarct also unchanged. No new area of acute infarct Small right parietal subdural low-density  fluid collection unchanged. Mixed density but predominantly low-density left subdural collection unchanged. Mild subarachnoid hemorrhage on the right is unchanged. Mild mass-effect on the right lateral ventricle without midline shift. Vascular: Negative for hyperdense vessel. Vascular stent in the posterior branch of the right middle cerebral artery unchanged. Skull: Left craniotomy. Right burr hole. No acute skeletal abnormality Sinuses/Orbits: Bony thickening right maxillary sinus. Mild mucosal edema in the ethmoid sinuses. Air-fluid level left frontal sinus. Mastoid clear bilaterally. Other: None IMPRESSION: No change in right posterior MCA infarct with hemorrhagic transformation. No new area of hemorrhage. Right MCA branch stent unchanged. Bilateral subdural collections stable. Electronically Signed   By: Franchot Gallo M.D.   On: 09/18/2020 18:50    PHYSICAL EXAM Blood pressure (!) 139/59, pulse 64, temperature 97.7 F (36.5 C), temperature source Oral, resp. rate 18, height 5\' 9"  (1.753 m), weight 93 kg, SpO2 99  %.  General: alert and awake, elderly obese caucasian male, no apparent distress  Lungs: Symmetrical Chest rise, no labored breathing  Cardio: Regular Rate and Rhythm  Abdomen: Soft, non-tender  Neuro: Alert, oriented, thought content appropriate.  Speech fluent without evidence of aphasia.  Able to follow all commands without difficulty. Cranial Nerves: II:  Visual fields grossly normal difficulty tracking on the Left side, pupils equal, round, reactive to light and accommodation III,IV, VI: ptosis not present, extra-ocular motions intact bilaterally V,VII: smile symmetric, facial light touch sensation normal bilaterally VIII: hearing normal bilaterally IX,X: uvula rises symmetrically XI: bilateral shoulder shrug XII: midline tongue extension without atrophy or fasciculations  Motor: LUE drifts Right : Upper extremity   4+/5    Left:    Upper extremity   4+/5  Lower extremity   4+/5    Lower extremity   4/5 Tone and bulk:normal tone throughout; no atrophy noted Sensory: light touch intact throughout, bilaterally   ASSESSMENT/PLAN Frank [name] is a 80 y.o. 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 Was found to have a right MCA M2 branch occlusion.   He is more awake and alert today. Not complaining of SOB. Will get Hospitalist consult. Phosphorus and Mag are WNL. AST and ALT have slight increase 52/78 from 2/28 when it was 22/22. Will plan for DC tomorrow pending any recommendations from Hospitalists.  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 ASPECTS is 10.  CTA head cclusion of a posterior Right MCA distal M2 branch at its bifurcation.  CTA neckright ICA origin with tandem HIGH-GRADE STENOSIS  CT perfusion small infarct core of 13 mL with regional penumbra of 41 mL (28 mL mismatch).   MRI/MRA h: Large acute/subacute left-sided infarct with parenchymal hemorrhage measuring over 4 cm. PH2 hemorrhage. Element of acute right subarachnoid hemorrhage. Chronic bilateral subdural hematomas.  2D EchoEF 65-70%, severe LVH, Grade I diastolic dysfunction  [id number]  HgbA1c7.8  VTE prophylaxis - SCDs, Heparin 5000units q 8hr  No antithromboticprior to admission, now on aspirin 81 mg dailyand ticagrelor 45mg  daily (reduced secondary to PH2 hemorrhage)  Therapy recommendations: CIR-> wife requested [facility] for inpatient rehab  Disposition:Accepted to  the facility for discharge today but became more somnolent.    Further work-up-ABG, CT head, labs. See follow-up progress note.  Subarachnoid Hemorrhage  S/p Thrombectomy, stent placement distal M2, balloon angioplasty  CT head 3/1: Increased subarachnoid hemorrhage the right sylvian fissure.   CT head 3/3: Related hemorrhage is decreasing.  SBP goal changed to <160  Cerebral edema  Hypertonic Saline ->off  Allow gradual Na trending down  CT repeat 3/3 no visible progression  Na 154->150->150->151->142->137  Dysphagia  Improving, Likely due to stroke  Now on dys1 and nectar thick  Seizure history  Previously on Keppra due to seizures but eventually weaned off  Keppra decreased 3/2 to 500mg  bid for prophylaxis due to somnolence  EEG (3/2) suggestive of cortical dysfunction in right hemisphere and moderate diffuse encephalopathy.  Chronic b/l SDH  CT head and MRI brain showed b/l subacute to chronic SDH  Did not receive tPA  Now on DAPT with half dose of brilinta  Stable on repeat CTs  Acute respiratory failure with  hypoxia, improving Aspiration event  short course of cefepime for aspiration ->switched to Augmentin, which was also completed.  afebrile  aspiration precautions   SLP   CCM signed off  Hypertension  Home meds: Amlodipine 10mg  daily, Cadura 2mg  at bedtime, Micardis 20mg  daily  Currently on Amlodipine 10mg  daily and Cardura 2mg  qHS  Clonodine patch added 3/2  SBP goal <160 in the setting of SAH, ICH  hydralazine, labetolol prn  Long-term BP goal normotensive  Hyperlipidemia  Home meds: atorvastatin 20 daily  LDL 29, goal < 70  Started on atorvastatin 10mg  daily - no high intensity given low LDL  Continue atorvastatin 10 at discharge  Diabetes type II Uncontrolled  Home meds: Glipizide 10mg  daily, glargine 10units daily  HgbA1c 7.8, goal < 7.0  CBGs  SSI, glucose range 135-233  lantus 5 units qHS, Novolog 2units q 4 hours  consulteddiabetes coordinatorfor newrecommendations  CKD IIIb  Likely due to contrast load for imaging/IR and dehydration  Creatinine level 1.98 -> 1.85->1.47->1.46->1.45->1.35->1.31  On TF  BMP daily  Other Stroke Risk Factors  Advanced Age >/= 40   Obesity,Body mass index is 30.86 kg/m., BMI >/= 30 associated with increased stroke risk, recommend weight loss, diet and exercise as appropriate   Coronary artery disease AV block, Mobitz 2, pacer  Obstructive sleep apnea, CPAP at home  Other Active Problems  Anxiety/depression: prozac  GERD: protonix  Anemia: iron deficient and B12 deficient  Constipation: senna  OSA: ok to use CPAP from home  Hospital day # [place] MD Resident  Attending addendum Patient seen and examined Imaging independently reviewed Appreciate hospitalist consult (awaiting formal recommendations )-consulted because of complaints of shortness of breath in spite of a normal saturation on the monitor, unremarkable chest x-ray and unremarkable arterial blood  gas. Holding discharge for another day as he is much better today but the wife feels more comfortable if he remained stable for at least a day before going to acute rehab. Agree with the history and physical along with the plan documented above which I helped formulate. Likely discharge to CIR tomorrow.  -- Amie Portland, MD Neurologist [facility] Pager: [phone]   To contact Stroke Continuity provider, please refer to [email or web address] After hours, contact General Neurology

## 2020-09-19 NOTE — Progress Notes (Signed)
Pt AOx4 and easy to arouse, but still drowsy w/o c/o SOB of dyspnea.

## 2020-09-19 NOTE — Progress Notes (Signed)
Physical Therapy Treatment Patient Details Name: Frank [name] MRN: [id number] DOB: 18-Sep-1940 Today's Date: 09/19/2020    History of Present Illness Patient is a 80 y/o male who presents with dizziness, confusion, right weakness and right facial droop. NIH:4. Admited and found to have right MCA M2 branch occlusion s/p revascularization with placement of stent and Right ICA balloon angioplasty 09/09/20. Repeat Head CT 3/1- increased SAH in right perisylvian fissure and parenchymal hemorrhage in the infarct territory with small volume intraventricular extension. MRI pending. PMH includes TBI, SDH, CKD, OSA, neuropathy, HTN, DM, AV block s/p PPM.    PT Comments    Patient progressing towards physical therapy goals. Patient requires mod-maxA+2 for OOB mobility with HHAx2. Max cues provided for upright posture and LE sequencing. Increased time required to process 1 step commands. Patient continues to present with L inattention and impaired coordination. Patient was able to achieve midline in static sitting with hands in lap, however L lateral and posterior lean in standing which increased with fatigued. Continue to recommend comprehensive inpatient rehab (CIR) for post-acute therapy needs.     Follow Up Recommendations  CIR;Supervision/Assistance - 24 hour     Equipment Recommendations  Wheelchair (measurements PT);Wheelchair cushion (measurements PT)    Recommendations for Other Services       Precautions / Restrictions Precautions Precautions: Fall Precaution Comments: watch o2 Restrictions Weight Bearing Restrictions: No    Mobility  Bed Mobility Overal bed mobility: Needs Assistance Bed Mobility: Supine to Sit     Supine to sit: Mod assist;+2 for physical assistance;+2 for safety/equipment     General bed mobility comments: modA+2 to bring B LEs off bed, trunk elevation to EOB, and repositioning of hips with bed pads    Transfers Overall transfer level: Needs  assistance Equipment used: Rolling Aman Batley (2 wheeled);2 person hand held assist Transfers: Sit to/from [employer] Sit to Stand: Max assist;Mod assist;+2 physical assistance;+2 safety/equipment   Squat pivot transfers: Max assist;+2 physical assistance;+2 safety/equipment     General transfer comment: Sit to stand initially maxA+2 with RW. Patient unable to maintain L hand on RW and not attending to L side in standing. Progressed to modA+2 with HHAx2 to stand from EOB. Attempted gait, however difficulty sequencing and unable to reach chair. Performed squat pivot transfer with maxA+2 towards R.  Ambulation/Gait Ambulation/Gait assistance: Max assist;+2 physical assistance;+2 safety/equipment Gait Distance (Feet): 1 Feet Assistive device: 2 person hand held assist Gait Pattern/deviations: Wide base of support;Shuffle     General Gait Details: side steps with max cues for sequencing and assist for balance and weight shifting to advance LEs   Stairs             Wheelchair Mobility    Modified Rankin (Stroke Patients Only) Modified Rankin (Stroke Patients Only) Pre-Morbid Rankin Score: Slight disability Modified Rankin: Moderately severe disability     Balance Overall balance assessment: Needs assistance Sitting-balance support: Bilateral upper extremity supported;Feet supported Sitting balance-Leahy Scale: Fair Sitting balance - Comments: initially with posterior lean, with cues to place hands in lap, patient was able to maintain midline with min guard - close supervision Postural control: Posterior lean Standing balance support: Bilateral upper extremity supported;During functional activity Standing balance-Leahy Scale: Poor Standing balance comment: needs external support for balance                            Cognition Arousal/Alertness: Awake/alert Behavior During Therapy: [facility] for tasks assessed/performed Overall Cognitive  Status:  Impaired/Different from baseline Area of Impairment: Following commands;Safety/judgement                       Following Commands: Follows one step commands with increased time Safety/Judgement: Decreased awareness of safety;Decreased awareness of deficits            Exercises      General Comments General comments (skin integrity, edema, etc.): Wife and brother present during session and supportive      Pertinent Vitals/Pain Pain Assessment: No/denies pain    Home Living                      Prior Function            PT Goals (current goals can now be found in the care plan section) Acute Rehab PT Goals Patient Stated Goal: to be vertical PT Goal Formulation: With patient/family Time For Goal Achievement: 09/24/20 Potential to Achieve Goals: Good Progress towards PT goals: Progressing toward goals    Frequency    Min 4X/week      PT Plan Current plan remains appropriate    Co-evaluation PT/OT/SLP Co-Evaluation/Treatment: Yes Reason for Co-Treatment: For patient/therapist safety;To address functional/ADL transfers PT goals addressed during session: Mobility/safety with mobility;Balance        AM-PAC PT "6 Clicks" Mobility   Outcome Measure  Help needed turning from your back to your side while in a flat bed without using bedrails?: A Little Help needed moving from lying on your back to sitting on the side of a flat bed without using bedrails?: A Lot Help needed moving to and from a bed to a chair (including a wheelchair)?: A Lot Help needed standing up from a chair using your arms (e.g., wheelchair or bedside chair)?: A Lot Help needed to walk in hospital room?: A Lot Help needed climbing 3-5 steps with a railing? : Total 6 Click Score: 12    End of Session Equipment Utilized During Treatment: Gait belt;Oxygen Activity Tolerance: Patient tolerated treatment well Patient left: in chair;with call bell/phone within reach;with chair  alarm set Nurse Communication: Mobility status PT Visit Diagnosis: Hemiplegia and hemiparesis;Difficulty in walking, not elsewhere classified (R26.2);Unsteadiness on feet (R26.81) Hemiplegia - Right/Left: Left Hemiplegia - dominant/non-dominant: Non-dominant Hemiplegia - caused by: Cerebral infarction     Time: 1059-1130 PT Time Calculation (min) (ACUTE ONLY): 31 min  Charges:  $Therapeutic Activity: 8-22 mins                     Alaijah Gibler A. Gilford Rile PT, DPT Acute Rehabilitation Services Pager [phone] Office [phone]    Linna Hoff 09/19/2020, 12:49 PM

## 2020-09-19 NOTE — Consult Note (Addendum)
[facility] Medical Consultation  Frank [name] NUU:[id number] DOB: Feb 11, 1941 DOA: 09/09/2020 PCP: Janith Lima, MD   Requesting physician: Dr. Malen Gauze Date of consultation: 09/19/2020 Reason for consultation: Shortness of breath   Impression/Recommendations Active Problems:   Stroke (cerebrum) ([address])   Middle cerebral artery embolism, right    1. CVA  s/p IR revascularization of occluded right MCA subarachnoid hemorrhage with cerebral edema: Patient with complicated hospital course. -Per neurology -Aspirin and ticagrelor -Plan for transfer for CIR  2. Acute respiratory failure with hypoxia and hypercapnia secondary to suspected aspiration pneumonia dyspnea: Patient was previously suspected to have an aspiration event on 3/3 and was treated with a short course of cefepime.  Since that time symptoms had resolved, but patient still was on 2 L of nasal cannula oxygen intermittently.  Patient was complaining of dyspnea yesterday and was noted to be intermittently falling asleep.  ABG revealed mild elevation in pCO2, but pH was compensated.   Chest x-ray showing bibasilar atelectasis. Patient had not been on CPAP at night during his hospital stay.  Patient does not appear to be fully expanding his lungs and suspect this is likely multifactorial in nature given stroke and prolonged hospital stay.  No significant infectious symptoms appreciated at this time. -Continue aspiration precautions -Incentive spirometer every hour while awake -Pulmonary toiletry and flutter valve -Up to chair every shift   3. Diabetes mellitus type 2: Uncontrolled.  Hemoglobin A1c 7.8. -Continue current regimen with recommendations by diabetes coordinator  4. Hypertension: Blood pressures currently controlled on current regimen. -Current medication regimen  5. Hyperlipidemia: LDL was 29 -Atorvastatin 10 mg daily  6. OSA -Ensure respiratory is placing patient on CPAP nightly  I will followup  again tomorrow. Please contact me if I can be of assistance in the meanwhile. Thank you for this consultation.  Chief Complaint: Right-sided weakness  HPI:  Frank [name] is a 80 y.o. 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  Work-up revealed emergent occlusion of the posterior right MCA distal M2 branch at its bifurcation.  Patient was not a candidate for TPA due to history of intracranial bleed, but underwent mechanical thrombectomy with right ICA stent and balloon angioplasty with interventional radiology.  MRI was significant for large subacute left-sided infarct with parenchymal hemorrhage measuring over 4 cm and a acute right subarachnoid hemorrhage and chronic bilateral subdural hematomas.  He was placed on prophylactic Keppra and 3% normal saline IV fluids for cerebral edema.  Patient developed respiratory distress on 3/3 requiring placement on nasal cannula oxygen.  Suspected patient had possibly aspirated for which he was placed on empiric antibiotics cefepime for short period in time.  Patient had been complaining of shortness of breath yesterday and was noted to be more lethargic easily falling asleep.  Work-up included ABG which revealed pH 7.425, PCO2 49.7, and PO2 126.  Chest x-ray at that time noted bibasilar atelectasis with otherwise no active disease appreciated. It appears that he had not been on his CPAP at night.  His wife brought in his home machine yesterday.  Last night it seems that the patient may still not have been placed on home CPAP last night.  At this time patient denies any complaints of shortness of breath.   Review of Systems  Respiratory:  Positive for cough. Negative for shortness of breath.      Past Medical History:   Diagnosis Date  . Allergy    Rhinitis  . Anemia    NOS iron deficient and B12 deficient  . Arthritis   . 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 Diabetes mellitus    Type 2  . GERD (gastroesophageal reflux disease)   . Hyperlipidemia   . Hypertension   . 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 OSA (obstructive sleep apnea)    cpap  . TBI (traumatic brain injury) [facility])    Past Surgical History:  Procedure Laterality Date  . APPENDECTOMY    . arm fracture Left 2011   with hardware  . BURR HOLE Bilateral 08/04/2017   Procedure: Haskell Flirt;  Surgeon: [name], MD;  Location: [place];  Service: Neurosurgery;  Laterality: Bilateral;  . COLONOSCOPY    . CRANIOTOMY Left 12/01/2017   Procedure: Trudee Kuster Holes CRANIOTOMY HEMATOMA EVACUATION SUBDURAL;  Surgeon: [name], MD;  Location: [place];  Service: Neurosurgery;  Laterality: Left;  . ESOPHAGOGASTRODUODENOSCOPY  2006   gastritis  . IR PERCUTANEOUS ART THROMBECTOMY/INFUSION INTRACRANIAL INC DIAG ANGIO  09/09/2020  . PACEMAKER IMPLANT N/A 05/14/2020   Procedure: PACEMAKER IMPLANT;  Surgeon: [name], MD;  Location: [place] CV LAB;  Service: Cardiovascular;  Laterality: N/A;  . RADIOLOGY WITH ANESTHESIA N/A 09/09/2020   Procedure: IR WITH ANESTHESIA;  Surgeon: Radiologist, Medication, MD;  Location: [place];  Service: Radiology;  Laterality: N/A;  . ROTATOR CUFF REPAIR    . SEPTOPLASTY  1959   Deviated Septum  . 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 He smoked 1.00 pack per day. He has never used smokeless tobacco. He reports current alcohol use of about 2.0 standard drinks of alcohol per week. He reports that he does not use drugs.  Allergies  Allergen Reactions  . Lisinopril Cough  . Invokana [Canagliflozin] Other (See Comments)    Stopped by MD  . Penicillins Other (See Comments)    Allergic as a child.  Patient does not remember reaction.  Has had cephalasporins  without problems.  . Sulfamethoxazole Other (See Comments)    Unknown reaction- from childhood   Family History  Problem Relation Age of Onset  . Heart disease Mother   . Breast cancer Mother   . Stroke Father   . Allergies Father        Father and children  . Coronary artery disease Brother   . Diabetes Neg Hx   . Colon cancer Neg Hx     Prior to Admission medications   Medication Sig Start Date End Date Taking? Authorizing Provider  acetaminophen (TYLENOL) 325 MG tablet Take 1-2 tablets (325-650 mg total) by mouth every 4 (four) hours as needed for mild pain. 05/15/20  Yes Shirley Friar, PA-C  amLODipine (NORVASC) 10 MG tablet Take 10 mg by mouth at bedtime.   Yes [provider]  atorvastatin (LIPITOR) 20 MG tablet TAKE ONE-HALF TABLET BY  MOUTH DAILY Patient taking differently: Take 10 mg by mouth daily. 06/06/20  Yes Janith Lima, MD  azelastine (ASTELIN) 0.1 % nasal spray Place 1 spray into both nostrils 2 (two) times daily. Use in each nostril as directed 11/27/19  Yes Janith Lima, MD  Carbonyl Iron 25 MG TABS Take 25 mg by mouth daily with breakfast.    Yes  [provider]  doxazosin (CARDURA) 4 MG tablet TAKE ONE-HALF TABLET BY  MOUTH AT BEDTIME Patient taking differently: Take 2 mg by mouth at bedtime. 06/06/20  Yes Janith Lima, MD  FLUoxetine (PROZAC) 20 MG capsule Take 1 capsule (20 mg total) by mouth daily. 04/10/20  Yes Janith Lima, MD  glipiZIDE (GLUCOTROL XL) 10 MG 24 hr tablet Take 1 tablet (10 mg total) by mouth daily. 04/10/20  Yes Janith Lima, MD  Glucagon (GVOKE HYPOPEN 2-PACK) 1 MG/0.2ML SOAJ Inject 1 Act into the skin daily as needed. Patient taking differently: Inject 1 each into the skin as needed (low blood sugar). 04/30/20  Yes Janith Lima, MD  insulin glargine, 2 Unit Dial, (TOUJEO MAX SOLOSTAR) 300 UNIT/ML Solostar Pen Inject 10 Units into the skin daily. 04/16/20  Yes Janith Lima, MD  Insulin Pen Needle 32G  X 6 MM MISC 1 Act by Does not apply route daily. 04/08/20  Yes Janith Lima, MD  levocetirizine (XYZAL) 5 MG tablet Take 1 tablet (5 mg total) by mouth every evening. 11/27/19  Yes Janith Lima, MD  mupirocin ointment (BACTROBAN) 2 % Apply 1 application topically 2 (two) times daily. 05/31/20  Yes Marrian Salvage, FNP  pantoprazole (PROTONIX) 40 MG tablet TAKE 1 TABLET BY MOUTH  DAILY Patient taking differently: Take 40 mg by mouth daily. 04/30/20  Yes Janith Lima, MD  PRESCRIPTION MEDICATION CPAP- At bedtime   Yes [provider]  psyllium (METAMUCIL) 58.6 % powder Take 1 packet by mouth daily as needed (for constipation- MIX AND DRINK).    Yes [provider]  solifenacin (VESICARE) 10 MG tablet TAKE 1 TABLET BY MOUTH  DAILY Patient taking differently: Take 10 mg by mouth daily. 08/09/20  Yes Janith Lima, MD  telmisartan (MICARDIS) 20 MG tablet TAKE 1 TABLET BY MOUTH  DAILY Patient taking differently: Take 20 mg by mouth daily. 09/07/20  Yes Janith Lima, MD  triamcinolone cream (KENALOG) 0.1 % Apply 1 application topically daily as needed (to affected areas).  07/26/19  Yes [provider]  amLODipine (NORVASC) 10 MG tablet Take 1 tablet (10 mg total) by mouth daily. 09/18/20   Briant Cedar, MD  aspirin 81 MG chewable tablet Chew 1 tablet (81 mg total) by mouth daily. 09/18/20   Briant Cedar, MD  atorvastatin (LIPITOR) 10 MG tablet Take 1 tablet (10 mg total) by mouth daily. 09/18/20   Briant Cedar, MD  cloNIDine (CATAPRES - DOSED IN MG/24 HR) 0.2 mg/24hr patch Place 1 patch (0.2 mg total) onto the skin once a week. 09/18/20   Briant Cedar, MD  doxazosin (CARDURA) 2 MG tablet Take 1 tablet (2 mg total) by mouth at bedtime. 09/18/20   Briant Cedar, MD  FLUoxetine (PROZAC) 20 MG capsule Take 1 capsule (20 mg total) by mouth daily. 09/18/20   Briant Cedar, MD  levETIRAcetam (KEPPRA) 100 MG/ML solution Take 5 mLs  (500 mg total) by mouth 2 (two) times daily. 09/18/20   Briant Cedar, MD  loratadine (CLARITIN) 10 MG tablet Take 1 tablet (10 mg total) by mouth every evening. 09/18/20   Briant Cedar, MD  ticagrelor (BRILINTA) 90 MG TABS tablet Take 0.5 tablets (45 mg total) by mouth 2 (two) times daily. 09/18/20   Briant Cedar, MD   Physical Exam:  Constitutional: Elderly male who appears ill but in no acute distress Vitals:   09/19/20 0418  09/19/20 0419 09/19/20 0448 09/19/20 0746  BP: 137/68   (!) 139/59  Pulse: 67   64  Resp: 18   18  Temp: 97.7 F (36.5 C)   97.7 F (36.5 C)  TempSrc: Oral   Oral  SpO2: (!) 89% 98%  99%  Weight:   93 kg   Height:       Eyes: PERRL, lids and conjunctivae normal ENMT: Mucous membranes aredry. Posterior pharynx clear of any exudate or lesions.  Neck: normal, supple, no masses, no thyromegaly Respiratory: Decreased aeration with no significant expiratory wheezes appreciated in mild bibasilar crackles.  Patient currently on 2 L nasal cannula oxygen with O2 saturations maintained. Cardiovascular: Regular rate and rhythm, no murmurs / rubs / gallops. No extremity edema. 2+ pedal pulses. No carotid bruits.  Abdomen: no tenderness, no masses palpated. No hepatosplenomegaly. Bowel sounds positive.  Musculoskeletal: no clubbing / cyanosis. No joint deformity upper and lower extremities. Good ROM, no contractures. Normal muscle tone.  Skin: no rashes, lesions, ulcers. No induration Neurologic: CN 2-12 grossly intact.  Strength appears to be roughly 4+ out of 5 in all extremities. Psychiatric: Normal judgment and insight. Alert and oriented x 3. Normal mood.   Labs on Admission:  Basic Metabolic Panel: Recent Labs  Lab 09/12/20 1725 09/12/20 2219 09/13/20 0625 09/13/20 1026 09/13/20 1627 09/13/20 2201 09/15/20 0150 09/16/20 0340 09/17/20 0349 09/18/20 0431 09/19/20 0413  NA 147*   < > 152*   < > 155*   < > 151* 142 137 138 137  K  --    <  > 4.1  --   --   --  4.1 4.1 4.0 4.3 4.7  CL  --    < > 117*  --   --   --  117* 109 100 100 99  CO2  --    < > 25  --   --   --  26 25 27 29  32  GLUCOSE  --    < > 238*  --   --   --  195* 211* 233* 130* 157*  BUN  --    < > 33*  --   --   --  34* 38* 38* 34* 34*  CREATININE  --    < > 1.46*  --   --   --  1.45* 1.35* 1.31* 1.42* 1.44*  CALCIUM  --    < > 9.0  --   --   --  8.5* 8.6* 8.7* 8.8* 8.8*  MG 1.8  --  1.8  --  1.7  --   --   --   --   --   --   PHOS 3.4  --  3.4  --  2.9  --   --   --   --   --   --    < > = values in this interval not displayed.   Liver Function Tests: No results for input(s): AST, ALT, ALKPHOS, BILITOT, PROT, ALBUMIN in the last 168 hours. No results for input(s): LIPASE, AMYLASE in the last 168 hours. No results for input(s): AMMONIA in the last 168 hours. CBC: Recent Labs  Lab 09/15/20 0150 09/16/20 0340 09/17/20 0349 09/18/20 0431 09/19/20 0413  WBC 6.3 6.2 6.6 7.2 6.2  HGB 9.7* 9.0* 8.6* 9.3* 9.4*  HCT 31.6* 29.1* 28.3* 29.5* 29.4*  MCV 87.3 87.7 85.5 83.8 83.5  PLT 124* 129* 122* 129* 122*   Cardiac Enzymes: No results for  input(s): CKTOTAL, CKMB, CKMBINDEX, TROPONINI in the last 168 hours. BNP: Invalid input(s): POCBNP CBG: Recent Labs  Lab 09/18/20 1312 09/18/20 1605 09/18/20 2003 09/18/20 2230 09/19/20 0612  GLUCAP 161* 101* 152* 165* 168*    Radiological Exams on Admission: DG Chest 1 View  Result Date: 09/18/2020 CLINICAL DATA:  SOB EXAM: CHEST  1 VIEW COMPARISON:  09/12/2020 FINDINGS: Bibasilar atelectasis. No focal consolidation. No pleural effusion or pneumothorax. Heart and mediastinal contours are unremarkable. Dual lead pacemaker. No acute osseous abnormality. IMPRESSION: No active disease. Bibasilar atelectasis. Electronically Signed   By: Kathreen Devoid   On: 09/18/2020 13:54   CT HEAD WO CONTRAST  Result Date: 09/18/2020 CLINICAL DATA:  Stroke.  Mental status change EXAM: CT HEAD WITHOUT CONTRAST TECHNIQUE: Contiguous axial  images were obtained from the base of the skull through the vertex without intravenous contrast. COMPARISON:  CT head 09/12/2020 FINDINGS: Brain: Large infarct right posterior MCA territory is unchanged in size. Hemorrhagic transformation within this infarct also unchanged. No new area of acute infarct Small right parietal subdural low-density fluid collection unchanged. Mixed density but predominantly low-density left subdural collection unchanged. Mild subarachnoid hemorrhage on the right is unchanged. Mild mass-effect on the right lateral ventricle without midline shift. Vascular: Negative for hyperdense vessel. Vascular stent in the posterior branch of the right middle cerebral artery unchanged. Skull: Left craniotomy. Right burr hole. No acute skeletal abnormality Sinuses/Orbits: Bony thickening right maxillary sinus. Mild mucosal edema in the ethmoid sinuses. Air-fluid level left frontal sinus. Mastoid clear bilaterally. Other: None IMPRESSION: No change in right posterior MCA infarct with hemorrhagic transformation. No new area of hemorrhage. Right MCA branch stent unchanged. Bilateral subdural collections stable. Electronically Signed   By: Franchot Gallo M.D.   On: 09/18/2020 18:50    EKG: Independently reviewed.  Sinus rhythm at 72 bpm from 09/10/2020.  Time spent: >45 minutes  [facility]   If 7PM-7AM, please contact night-coverage

## 2020-09-19 NOTE — Progress Notes (Signed)
SLP Cancellation Note  Patient Details Name: Frank [name] MRN: [id number] DOB: July 07, 1941   Cancelled treatment:       Reason Eval/Treat Not Completed: Other (comment);Patient at procedure or test/unavailable   Elvina Sidle, M.S., CCC-SLP 09/19/2020, 2:53 PM

## 2020-09-19 NOTE — Plan of Care (Signed)

## 2020-09-19 NOTE — Plan of Care (Signed)

## 2020-09-19 NOTE — Progress Notes (Signed)
Occupational Therapy Co-Treatment Patient Details Name: Frank [name] MRN: [id number] DOB: 06-03-1941 Today's Date: 09/19/2020    History of present illness Patient is a 80 y/o male who presents with dizziness, confusion, right weakness and right facial droop. NIH:4. Admited and found to have right MCA M2 branch occlusion s/p revascularization with placement of stent and Right ICA balloon angioplasty 09/09/20. Repeat Head CT 3/1- increased SAH in right perisylvian fissure and parenchymal hemorrhage in the infarct territory with small volume intraventricular extension. MRI pending. PMH includes TBI, SDH, CKD, OSA, neuropathy, HTN, DM, AV block s/p PPM.   OT comments  Patient met lying supine in bed in agreement with OT treatment session with focus on NMR, siting/standing balance, and functional transfers in prep for ADLs. Patient able to complete sit to stand transfers x2-3 trials with +2 assist and Mod to Max multimodal cues for sequencing, power negotiation, and hand placement. Patient unable to maintain position of LUE on RW 2/2 proprioceptive deficits and L inattention. Patient with great difficulty weight shifting to take steps despite Max A +2 and max cues. Patient able to advance from EOB to recliner with +2 assist via lateral scoot transfers. Patient would benefit from continued acute OT services to maximize safety and independence with self-care tasks and decrease caregiver burden. Continued recommendation for CIR given deficits and CLOF.    Follow Up Recommendations  CIR    Equipment Recommendations  3 in 1 bedside commode;Wheelchair (measurements OT);Wheelchair cushion (measurements OT)    Recommendations for Other Services      Precautions / Restrictions Precautions Precautions: Fall Precaution Comments: L hemi, L inattention, monitor vitals Restrictions Weight Bearing Restrictions: No       Mobility Bed Mobility Overal bed mobility: Needs Assistance Bed Mobility:  Supine to Sit     Supine to sit: Mod assist;+2 for physical assistance;+2 for safety/equipment     General bed mobility comments: Mod A+2 at BLE and trunk to advance fromsupine to EOB. HOB elevated and cues for hand placement/sequencing.    Transfers Overall transfer level: Needs assistance Equipment used: Rolling walker (2 wheeled);2 person hand held assist Transfers: Sit to/from [employer] Sit to Stand: Max assist;Mod assist;+2 physical assistance;+2 safety/equipment   Squat pivot transfers: Max assist;+2 physical assistance;+2 safety/equipment     General transfer comment: Sit to stand initially maxA+2 with RW. Patient unable to maintain L hand on RW and not attending to L side in standing. Progressed to modA+2 with HHAx2 to stand from EOB. Attempted gait, however difficulty sequencing and unable to reach chair. Performed squat pivot transfer with maxA+2 towards R.    Balance Overall balance assessment: Needs assistance Sitting-balance support: Bilateral upper extremity supported;Feet supported Sitting balance-Leahy Scale: Fair Sitting balance - Comments: initially with posterior lean, with cues to place hands in lap, patient was able to maintain midline with min guard - close supervision Postural control: Posterior lean Standing balance support: Bilateral upper extremity supported;During functional activity Standing balance-Leahy Scale: Poor Standing balance comment: +2 assist to maintain static stading balance. Patient with strong posterior bias.                           ADL either performed or assessed with clinical judgement   ADL                       Lower Body Dressing: Maximal assistance;+2 for physical assistance;Sit to/from stand  General ADL Comments: Patient limited by decreased attention, decreased awareness of deficits, L hemi, L inattention, and deceased proprioception.     Vision        Perception     Praxis      Cognition Arousal/Alertness: Awake/alert Behavior During Therapy: WFL for tasks assessed/performed Overall Cognitive Status: Impaired/Different from baseline Area of Impairment: Following commands;Safety/judgement                       Following Commands: Follows one step commands with increased time Safety/Judgement: Decreased awareness of safety;Decreased awareness of deficits   Problem Solving: Slow processing;Requires verbal cues;Requires tactile cues General Comments: Patient is easily distracted requiring frequent redirection to tasks.        Exercises     Shoulder Instructions       General Comments Wife and brother present at bedside.    Pertinent Vitals/ Pain       Pain Assessment: No/denies pain  Home Living                                          Prior Functioning/Environment              Frequency  Min 2X/week        Progress Toward Goals  OT Goals(current goals can now be found in the care plan section)  Progress towards OT goals: Progressing toward goals  Acute Rehab OT Goals Patient Stated Goal: "To be vertical" OT Goal Formulation: With patient Potential to Achieve Goals: Good ADL Goals Pt Will Perform Grooming: with mod assist Pt Will Transfer to Toilet: with min assist;with +2 assist;bedside commode;stand pivot transfer Additional ADL Goal #1: Pt will be Min A +2 to come up to sit EOB Additional ADL Goal #2: Continue to assess vision  Plan Discharge plan remains appropriate;Frequency remains appropriate    Co-evaluation      Reason for Co-Treatment: Complexity of the patient's impairments (multi-system involvement);For patient/therapist safety PT goals addressed during session: Mobility/safety with mobility;Balance OT goals addressed during session: ADL's and self-care;Strengthening/ROM      AM-PAC OT "6 Clicks" Daily Activity     Outcome Measure   Help from another  person eating meals?: A Little Help from another person taking care of personal grooming?: A Little Help from another person toileting, which includes using toliet, bedpan, or urinal?: A Lot Help from another person bathing (including washing, rinsing, drying)?: A Lot Help from another person to put on and taking off regular upper body clothing?: A Lot Help from another person to put on and taking off regular lower body clothing?: Total 6 Click Score: 13    End of Session Equipment Utilized During Treatment: Gait belt;Oxygen;Other (comment) (2L O2 via [place])  OT Visit Diagnosis: Unsteadiness on feet (R26.81);Muscle weakness (generalized) (M62.81)   Activity Tolerance Patient tolerated treatment well   Patient Left in chair;with call bell/phone within reach;with chair alarm set;with family/visitor present   Nurse Communication Mobility status (Stedy +2 for transfer back to bed)        Time: 1059-1130 OT Time Calculation (min): 31 min  Charges: OT General Charges $OT Visit: 1 Visit OT Treatments $Therapeutic Activity: 8-22 mins  Aritzel Krusemark H. OTR/L Supplemental OT, Department of rehab services [phone]   Deeann Saint R Howerton-Davis 09/19/2020, 2:12 PM

## 2020-09-20 ENCOUNTER — Telehealth: Payer: Medicare Other

## 2020-09-20 DIAGNOSIS — I69092 Facial weakness following nontraumatic subarachnoid hemorrhage: Secondary | ICD-10-CM | POA: Diagnosis not present

## 2020-09-20 DIAGNOSIS — I69098 Other sequelae following nontraumatic subarachnoid hemorrhage: Secondary | ICD-10-CM | POA: Diagnosis not present

## 2020-09-20 DIAGNOSIS — E1140 Type 2 diabetes mellitus with diabetic neuropathy, unspecified: Secondary | ICD-10-CM | POA: Diagnosis not present

## 2020-09-20 DIAGNOSIS — J9602 Acute respiratory failure with hypercapnia: Secondary | ICD-10-CM | POA: Diagnosis not present

## 2020-09-20 DIAGNOSIS — I6601 Occlusion and stenosis of right middle cerebral artery: Secondary | ICD-10-CM | POA: Diagnosis not present

## 2020-09-20 DIAGNOSIS — R6889 Other general symptoms and signs: Secondary | ICD-10-CM | POA: Diagnosis not present

## 2020-09-20 DIAGNOSIS — G4733 Obstructive sleep apnea (adult) (pediatric): Secondary | ICD-10-CM | POA: Diagnosis not present

## 2020-09-20 DIAGNOSIS — I10 Essential (primary) hypertension: Secondary | ICD-10-CM | POA: Diagnosis not present

## 2020-09-20 DIAGNOSIS — R1310 Dysphagia, unspecified: Secondary | ICD-10-CM | POA: Diagnosis not present

## 2020-09-20 DIAGNOSIS — Z743 Need for continuous supervision: Secondary | ICD-10-CM | POA: Diagnosis not present

## 2020-09-20 DIAGNOSIS — H538 Other visual disturbances: Secondary | ICD-10-CM | POA: Diagnosis not present

## 2020-09-20 DIAGNOSIS — R279 Unspecified lack of coordination: Secondary | ICD-10-CM | POA: Diagnosis not present

## 2020-09-20 DIAGNOSIS — I69093 Ataxia following nontraumatic subarachnoid hemorrhage: Secondary | ICD-10-CM | POA: Diagnosis not present

## 2020-09-20 DIAGNOSIS — R410 Disorientation, unspecified: Secondary | ICD-10-CM | POA: Diagnosis not present

## 2020-09-20 DIAGNOSIS — F32A Depression, unspecified: Secondary | ICD-10-CM | POA: Diagnosis not present

## 2020-09-20 DIAGNOSIS — D649 Anemia, unspecified: Secondary | ICD-10-CM | POA: Diagnosis not present

## 2020-09-20 DIAGNOSIS — Z794 Long term (current) use of insulin: Secondary | ICD-10-CM | POA: Diagnosis not present

## 2020-09-20 DIAGNOSIS — I63512 Cerebral infarction due to unspecified occlusion or stenosis of left middle cerebral artery: Secondary | ICD-10-CM | POA: Diagnosis not present

## 2020-09-20 DIAGNOSIS — R269 Unspecified abnormalities of gait and mobility: Secondary | ICD-10-CM | POA: Diagnosis not present

## 2020-09-20 DIAGNOSIS — G4730 Sleep apnea, unspecified: Secondary | ICD-10-CM | POA: Diagnosis not present

## 2020-09-20 DIAGNOSIS — N183 Chronic kidney disease, stage 3 unspecified: Secondary | ICD-10-CM | POA: Diagnosis not present

## 2020-09-20 DIAGNOSIS — I639 Cerebral infarction, unspecified: Secondary | ICD-10-CM | POA: Diagnosis not present

## 2020-09-20 DIAGNOSIS — I69091 Dysphagia following nontraumatic subarachnoid hemorrhage: Secondary | ICD-10-CM | POA: Diagnosis not present

## 2020-09-20 DIAGNOSIS — E11649 Type 2 diabetes mellitus with hypoglycemia without coma: Secondary | ICD-10-CM | POA: Diagnosis not present

## 2020-09-20 DIAGNOSIS — R338 Other retention of urine: Secondary | ICD-10-CM | POA: Diagnosis not present

## 2020-09-20 DIAGNOSIS — E785 Hyperlipidemia, unspecified: Secondary | ICD-10-CM | POA: Diagnosis not present

## 2020-09-20 DIAGNOSIS — R0602 Shortness of breath: Secondary | ICD-10-CM | POA: Diagnosis not present

## 2020-09-20 DIAGNOSIS — R339 Retention of urine, unspecified: Secondary | ICD-10-CM | POA: Diagnosis not present

## 2020-09-20 DIAGNOSIS — I607 Nontraumatic subarachnoid hemorrhage from unspecified intracranial artery: Secondary | ICD-10-CM | POA: Diagnosis not present

## 2020-09-20 DIAGNOSIS — I129 Hypertensive chronic kidney disease with stage 1 through stage 4 chronic kidney disease, or unspecified chronic kidney disease: Secondary | ICD-10-CM | POA: Diagnosis not present

## 2020-09-20 DIAGNOSIS — Z7409 Other reduced mobility: Secondary | ICD-10-CM | POA: Diagnosis not present

## 2020-09-20 DIAGNOSIS — E119 Type 2 diabetes mellitus without complications: Secondary | ICD-10-CM | POA: Diagnosis not present

## 2020-09-20 DIAGNOSIS — E1165 Type 2 diabetes mellitus with hyperglycemia: Secondary | ICD-10-CM | POA: Diagnosis not present

## 2020-09-20 LAB — GLUCOSE, CAPILLARY: Glucose-Capillary: 173 mg/dL — ABNORMAL HIGH (ref 70–99)

## 2020-09-20 MED ORDER — PANTOPRAZOLE SODIUM 40 MG PO PACK
40.0000 mg | PACK | Freq: Two times a day (BID) | ORAL | Status: DC
  Administered 2020-09-20: 40 mg via ORAL
  Filled 2020-09-20: qty 20

## 2020-09-20 MED ORDER — INSULIN GLARGINE 100 UNIT/ML ~~LOC~~ SOLN
5.0000 [IU] | Freq: Every day | SUBCUTANEOUS | Status: DC
  Administered 2020-09-20: 5 [IU] via SUBCUTANEOUS
  Filled 2020-09-20: qty 0.05

## 2020-09-20 NOTE — Plan of Care (Signed)

## 2020-09-20 NOTE — Progress Notes (Signed)
Pt placed on CPAP at 0000 at pt's request.

## 2020-09-20 NOTE — Progress Notes (Signed)
Inpatient Diabetes Program Recommendations  AACE/ADA: New Consensus Statement on Inpatient Glycemic Control (2015)  Target Ranges:  Prepandial:   less than 140 mg/dL      Peak postprandial:   less than 180 mg/dL (1-2 hours)      Critically ill patients:  140 - 180 mg/dL   Lab Results  Component Value Date   GLUCAP 173 (H) 09/20/2020   HGBA1C 7.8 (H) 09/10/2020    Review of Glycemic Control Results for NIKOLOS, BILLIG" (MRN [id number]) as of 09/20/2020 09:07  Ref. Range 09/19/2020 06:12 09/19/2020 12:26 09/19/2020 15:53 09/19/2020 17:55 09/19/2020 21:41 09/20/2020 06:20  Glucose-Capillary Latest Ref Range: 70 - 99 mg/dL 168 (H)  Novolog 2 units 291 (H)  Novolog 5 units 287 (H) 292 (H)  Novolog 5 units 236 (H)     Lantus 5 units 173 (H)  Novolog 2 units    Diabetes history: DM 2 Outpatient Diabetes medications:  Glucotrol XL 10 mg daily, Toujeo 10 units daily Current orders for Inpatient glycemic control:  Novolog 0-9 units tid  Lantus 5 units Daily  Inpatient Diabetes Program Recommendations:    -  May consider adding Novolog 2 units tid meal coverage if eating >50% of meals. -  Add Novolog hs scale  Thanks,  Tama Headings RN, MSN, BC-ADM Inpatient Diabetes Coordinator Team Pager [phone] (8a-5p)

## 2020-09-20 NOTE — Progress Notes (Signed)
Pt continuously pulling CPAP off face. Pt lethargic, but AOx4 when awake. I seriously doubt pt has benefited from CPAP, d/t pt pulling it off so many times Requiring assistance to put back on his face.

## 2020-09-20 NOTE — TOC Transition Note (Signed)
Transition of Care [facility]) - CM/SW Discharge Note   Patient Details  Name: Frank [name] MRN: [id number] Date of Birth: Jan 06, 1941  Transition of Care [facility]) CM/SW Contact:  Geralynn Ochs, LCSW Phone Number: 09/20/2020, 10:21 AM   Clinical Narrative:   Nurse to call report to [phone]    Final next level of care: IP Rehab Facility Barriers to Discharge: No Barriers Identified   Patient Goals and CMS Choice Patient states their goals for this hospitalization and ongoing recovery are:: to get rehab CMS Medicare.gov Compare Post Acute Care list provided to:: Patient Represenative (must comment) Choice offered to / list presented to : Spouse  Discharge Placement              Patient chooses bed at:  ([place]) Patient to be transferred to facility by: [place] Name of family member notified: Ina Patient and family notified of of transfer: 09/18/20  Discharge Plan and Services     Post Acute Care Choice: IP Rehab                               Social Determinants of Health (SDOH) Interventions     Readmission Risk Interventions No flowsheet data found.

## 2020-09-20 NOTE — Progress Notes (Signed)
Pt pulling off [place] 2L.

## 2020-09-23 DIAGNOSIS — R339 Retention of urine, unspecified: Secondary | ICD-10-CM | POA: Diagnosis not present

## 2020-09-23 DIAGNOSIS — Z7409 Other reduced mobility: Secondary | ICD-10-CM | POA: Diagnosis not present

## 2020-09-23 DIAGNOSIS — I10 Essential (primary) hypertension: Secondary | ICD-10-CM | POA: Diagnosis not present

## 2020-09-23 DIAGNOSIS — E1165 Type 2 diabetes mellitus with hyperglycemia: Secondary | ICD-10-CM | POA: Diagnosis not present

## 2020-09-23 DIAGNOSIS — I63512 Cerebral infarction due to unspecified occlusion or stenosis of left middle cerebral artery: Secondary | ICD-10-CM | POA: Diagnosis not present

## 2020-09-24 ENCOUNTER — Other Ambulatory Visit: Payer: Self-pay | Admitting: Internal Medicine

## 2020-09-24 DIAGNOSIS — R1310 Dysphagia, unspecified: Secondary | ICD-10-CM | POA: Diagnosis not present

## 2020-09-24 DIAGNOSIS — I63512 Cerebral infarction due to unspecified occlusion or stenosis of left middle cerebral artery: Secondary | ICD-10-CM | POA: Diagnosis not present

## 2020-09-24 DIAGNOSIS — E119 Type 2 diabetes mellitus without complications: Secondary | ICD-10-CM

## 2020-09-24 DIAGNOSIS — I607 Nontraumatic subarachnoid hemorrhage from unspecified intracranial artery: Secondary | ICD-10-CM | POA: Diagnosis not present

## 2020-09-24 DIAGNOSIS — E1165 Type 2 diabetes mellitus with hyperglycemia: Secondary | ICD-10-CM | POA: Diagnosis not present

## 2020-09-24 DIAGNOSIS — E1142 Type 2 diabetes mellitus with diabetic polyneuropathy: Secondary | ICD-10-CM

## 2020-09-25 DIAGNOSIS — I607 Nontraumatic subarachnoid hemorrhage from unspecified intracranial artery: Secondary | ICD-10-CM | POA: Diagnosis not present

## 2020-09-25 DIAGNOSIS — E1165 Type 2 diabetes mellitus with hyperglycemia: Secondary | ICD-10-CM | POA: Diagnosis not present

## 2020-09-25 DIAGNOSIS — R1310 Dysphagia, unspecified: Secondary | ICD-10-CM | POA: Diagnosis not present

## 2020-09-25 DIAGNOSIS — I63512 Cerebral infarction due to unspecified occlusion or stenosis of left middle cerebral artery: Secondary | ICD-10-CM | POA: Diagnosis not present

## 2020-09-26 DIAGNOSIS — E1165 Type 2 diabetes mellitus with hyperglycemia: Secondary | ICD-10-CM | POA: Diagnosis not present

## 2020-09-26 DIAGNOSIS — I607 Nontraumatic subarachnoid hemorrhage from unspecified intracranial artery: Secondary | ICD-10-CM | POA: Diagnosis not present

## 2020-09-26 DIAGNOSIS — R1310 Dysphagia, unspecified: Secondary | ICD-10-CM | POA: Diagnosis not present

## 2020-09-26 DIAGNOSIS — I63512 Cerebral infarction due to unspecified occlusion or stenosis of left middle cerebral artery: Secondary | ICD-10-CM | POA: Diagnosis not present

## 2020-09-27 DIAGNOSIS — R1310 Dysphagia, unspecified: Secondary | ICD-10-CM | POA: Diagnosis not present

## 2020-09-27 DIAGNOSIS — E1165 Type 2 diabetes mellitus with hyperglycemia: Secondary | ICD-10-CM | POA: Diagnosis not present

## 2020-09-27 DIAGNOSIS — I607 Nontraumatic subarachnoid hemorrhage from unspecified intracranial artery: Secondary | ICD-10-CM | POA: Diagnosis not present

## 2020-09-27 DIAGNOSIS — I63512 Cerebral infarction due to unspecified occlusion or stenosis of left middle cerebral artery: Secondary | ICD-10-CM | POA: Diagnosis not present

## 2020-09-28 DIAGNOSIS — R1310 Dysphagia, unspecified: Secondary | ICD-10-CM | POA: Diagnosis not present

## 2020-09-28 DIAGNOSIS — I607 Nontraumatic subarachnoid hemorrhage from unspecified intracranial artery: Secondary | ICD-10-CM | POA: Diagnosis not present

## 2020-09-28 DIAGNOSIS — I63512 Cerebral infarction due to unspecified occlusion or stenosis of left middle cerebral artery: Secondary | ICD-10-CM | POA: Diagnosis not present

## 2020-09-28 DIAGNOSIS — E1165 Type 2 diabetes mellitus with hyperglycemia: Secondary | ICD-10-CM | POA: Diagnosis not present

## 2020-09-29 DIAGNOSIS — R1310 Dysphagia, unspecified: Secondary | ICD-10-CM | POA: Diagnosis not present

## 2020-09-29 DIAGNOSIS — E1165 Type 2 diabetes mellitus with hyperglycemia: Secondary | ICD-10-CM | POA: Diagnosis not present

## 2020-09-29 DIAGNOSIS — I63512 Cerebral infarction due to unspecified occlusion or stenosis of left middle cerebral artery: Secondary | ICD-10-CM | POA: Diagnosis not present

## 2020-09-29 DIAGNOSIS — I607 Nontraumatic subarachnoid hemorrhage from unspecified intracranial artery: Secondary | ICD-10-CM | POA: Diagnosis not present

## 2020-09-30 DIAGNOSIS — R1310 Dysphagia, unspecified: Secondary | ICD-10-CM | POA: Diagnosis not present

## 2020-09-30 DIAGNOSIS — I63512 Cerebral infarction due to unspecified occlusion or stenosis of left middle cerebral artery: Secondary | ICD-10-CM | POA: Diagnosis not present

## 2020-09-30 DIAGNOSIS — I607 Nontraumatic subarachnoid hemorrhage from unspecified intracranial artery: Secondary | ICD-10-CM | POA: Diagnosis not present

## 2020-09-30 DIAGNOSIS — E1165 Type 2 diabetes mellitus with hyperglycemia: Secondary | ICD-10-CM | POA: Diagnosis not present

## 2020-10-01 ENCOUNTER — Telehealth (HOSPITAL_COMMUNITY): Payer: Self-pay

## 2020-10-01 ENCOUNTER — Other Ambulatory Visit: Payer: Self-pay | Admitting: Internal Medicine

## 2020-10-01 DIAGNOSIS — I63512 Cerebral infarction due to unspecified occlusion or stenosis of left middle cerebral artery: Secondary | ICD-10-CM | POA: Diagnosis not present

## 2020-10-01 DIAGNOSIS — E1165 Type 2 diabetes mellitus with hyperglycemia: Secondary | ICD-10-CM | POA: Diagnosis not present

## 2020-10-01 DIAGNOSIS — R1310 Dysphagia, unspecified: Secondary | ICD-10-CM | POA: Diagnosis not present

## 2020-10-01 DIAGNOSIS — I607 Nontraumatic subarachnoid hemorrhage from unspecified intracranial artery: Secondary | ICD-10-CM | POA: Diagnosis not present

## 2020-10-01 NOTE — Telephone Encounter (Signed)
Called to schedule hospital f/u, no answer, vm full. AW

## 2020-10-02 DIAGNOSIS — E1165 Type 2 diabetes mellitus with hyperglycemia: Secondary | ICD-10-CM | POA: Diagnosis not present

## 2020-10-02 DIAGNOSIS — I63512 Cerebral infarction due to unspecified occlusion or stenosis of left middle cerebral artery: Secondary | ICD-10-CM | POA: Diagnosis not present

## 2020-10-02 DIAGNOSIS — R1310 Dysphagia, unspecified: Secondary | ICD-10-CM | POA: Diagnosis not present

## 2020-10-02 DIAGNOSIS — I607 Nontraumatic subarachnoid hemorrhage from unspecified intracranial artery: Secondary | ICD-10-CM | POA: Diagnosis not present

## 2020-10-03 ENCOUNTER — Other Ambulatory Visit: Payer: Self-pay | Admitting: Internal Medicine

## 2020-10-03 DIAGNOSIS — E1165 Type 2 diabetes mellitus with hyperglycemia: Secondary | ICD-10-CM | POA: Diagnosis not present

## 2020-10-03 DIAGNOSIS — R1310 Dysphagia, unspecified: Secondary | ICD-10-CM | POA: Diagnosis not present

## 2020-10-03 DIAGNOSIS — I63512 Cerebral infarction due to unspecified occlusion or stenosis of left middle cerebral artery: Secondary | ICD-10-CM | POA: Diagnosis not present

## 2020-10-03 DIAGNOSIS — I607 Nontraumatic subarachnoid hemorrhage from unspecified intracranial artery: Secondary | ICD-10-CM | POA: Diagnosis not present

## 2020-10-04 ENCOUNTER — Telehealth: Payer: Self-pay | Admitting: Internal Medicine

## 2020-10-04 DIAGNOSIS — E1165 Type 2 diabetes mellitus with hyperglycemia: Secondary | ICD-10-CM | POA: Diagnosis not present

## 2020-10-04 DIAGNOSIS — I607 Nontraumatic subarachnoid hemorrhage from unspecified intracranial artery: Secondary | ICD-10-CM | POA: Diagnosis not present

## 2020-10-04 DIAGNOSIS — R1310 Dysphagia, unspecified: Secondary | ICD-10-CM | POA: Diagnosis not present

## 2020-10-04 DIAGNOSIS — I63512 Cerebral infarction due to unspecified occlusion or stenosis of left middle cerebral artery: Secondary | ICD-10-CM | POA: Diagnosis not present

## 2020-10-04 NOTE — Telephone Encounter (Signed)
There is nothing I can do about this

## 2020-10-04 NOTE — Telephone Encounter (Signed)
Patients daughter, [name] called and said that the patient had a stroke almost a month ago and is at [facility] in [place] and because of his supplemental insurance he is being kicked out tomorrow. She said that he is in no shape to come home and was wondering if there is anything that Dr. Ronnald Ramp could do. She can be reached at [phone]. Please advise

## 2020-10-07 ENCOUNTER — Other Ambulatory Visit (HOSPITAL_COMMUNITY): Payer: Self-pay | Admitting: Interventional Radiology

## 2020-10-07 DIAGNOSIS — R269 Unspecified abnormalities of gait and mobility: Secondary | ICD-10-CM | POA: Diagnosis not present

## 2020-10-07 DIAGNOSIS — E119 Type 2 diabetes mellitus without complications: Secondary | ICD-10-CM | POA: Diagnosis not present

## 2020-10-07 DIAGNOSIS — G4733 Obstructive sleep apnea (adult) (pediatric): Secondary | ICD-10-CM | POA: Diagnosis not present

## 2020-10-07 DIAGNOSIS — R1310 Dysphagia, unspecified: Secondary | ICD-10-CM | POA: Diagnosis not present

## 2020-10-07 DIAGNOSIS — I639 Cerebral infarction, unspecified: Secondary | ICD-10-CM | POA: Diagnosis not present

## 2020-10-07 NOTE — Telephone Encounter (Signed)
Called pt's daughter, LVM with details from PCP below.

## 2020-10-08 DIAGNOSIS — R1310 Dysphagia, unspecified: Secondary | ICD-10-CM | POA: Diagnosis not present

## 2020-10-08 DIAGNOSIS — R269 Unspecified abnormalities of gait and mobility: Secondary | ICD-10-CM | POA: Diagnosis not present

## 2020-10-08 DIAGNOSIS — G4733 Obstructive sleep apnea (adult) (pediatric): Secondary | ICD-10-CM | POA: Diagnosis not present

## 2020-10-08 DIAGNOSIS — E119 Type 2 diabetes mellitus without complications: Secondary | ICD-10-CM | POA: Diagnosis not present

## 2020-10-08 DIAGNOSIS — I639 Cerebral infarction, unspecified: Secondary | ICD-10-CM | POA: Diagnosis not present

## 2020-10-09 DIAGNOSIS — E119 Type 2 diabetes mellitus without complications: Secondary | ICD-10-CM | POA: Diagnosis not present

## 2020-10-09 DIAGNOSIS — I639 Cerebral infarction, unspecified: Secondary | ICD-10-CM | POA: Diagnosis not present

## 2020-10-09 DIAGNOSIS — G4733 Obstructive sleep apnea (adult) (pediatric): Secondary | ICD-10-CM | POA: Diagnosis not present

## 2020-10-09 DIAGNOSIS — R269 Unspecified abnormalities of gait and mobility: Secondary | ICD-10-CM | POA: Diagnosis not present

## 2020-10-09 DIAGNOSIS — R1310 Dysphagia, unspecified: Secondary | ICD-10-CM | POA: Diagnosis not present

## 2020-10-10 DIAGNOSIS — I63512 Cerebral infarction due to unspecified occlusion or stenosis of left middle cerebral artery: Secondary | ICD-10-CM | POA: Diagnosis not present

## 2020-10-10 DIAGNOSIS — R1310 Dysphagia, unspecified: Secondary | ICD-10-CM | POA: Diagnosis not present

## 2020-10-10 DIAGNOSIS — I607 Nontraumatic subarachnoid hemorrhage from unspecified intracranial artery: Secondary | ICD-10-CM | POA: Diagnosis not present

## 2020-10-10 DIAGNOSIS — E1165 Type 2 diabetes mellitus with hyperglycemia: Secondary | ICD-10-CM | POA: Diagnosis not present

## 2020-10-11 DIAGNOSIS — R1310 Dysphagia, unspecified: Secondary | ICD-10-CM | POA: Diagnosis not present

## 2020-10-11 DIAGNOSIS — I607 Nontraumatic subarachnoid hemorrhage from unspecified intracranial artery: Secondary | ICD-10-CM | POA: Diagnosis not present

## 2020-10-11 DIAGNOSIS — E1165 Type 2 diabetes mellitus with hyperglycemia: Secondary | ICD-10-CM | POA: Diagnosis not present

## 2020-10-11 DIAGNOSIS — I63512 Cerebral infarction due to unspecified occlusion or stenosis of left middle cerebral artery: Secondary | ICD-10-CM | POA: Diagnosis not present

## 2020-10-14 DIAGNOSIS — I10 Essential (primary) hypertension: Secondary | ICD-10-CM | POA: Diagnosis not present

## 2020-10-14 DIAGNOSIS — E1165 Type 2 diabetes mellitus with hyperglycemia: Secondary | ICD-10-CM | POA: Diagnosis not present

## 2020-10-14 DIAGNOSIS — Z7409 Other reduced mobility: Secondary | ICD-10-CM | POA: Diagnosis not present

## 2020-10-14 DIAGNOSIS — R339 Retention of urine, unspecified: Secondary | ICD-10-CM | POA: Diagnosis not present

## 2020-10-14 DIAGNOSIS — I63512 Cerebral infarction due to unspecified occlusion or stenosis of left middle cerebral artery: Secondary | ICD-10-CM | POA: Diagnosis not present

## 2020-10-15 DIAGNOSIS — I10 Essential (primary) hypertension: Secondary | ICD-10-CM | POA: Diagnosis not present

## 2020-10-15 DIAGNOSIS — I63512 Cerebral infarction due to unspecified occlusion or stenosis of left middle cerebral artery: Secondary | ICD-10-CM | POA: Diagnosis not present

## 2020-10-15 DIAGNOSIS — R339 Retention of urine, unspecified: Secondary | ICD-10-CM | POA: Diagnosis not present

## 2020-10-15 DIAGNOSIS — E1165 Type 2 diabetes mellitus with hyperglycemia: Secondary | ICD-10-CM | POA: Diagnosis not present

## 2020-10-15 DIAGNOSIS — Z7409 Other reduced mobility: Secondary | ICD-10-CM | POA: Diagnosis not present

## 2020-10-16 DIAGNOSIS — I10 Essential (primary) hypertension: Secondary | ICD-10-CM | POA: Diagnosis not present

## 2020-10-16 DIAGNOSIS — I63512 Cerebral infarction due to unspecified occlusion or stenosis of left middle cerebral artery: Secondary | ICD-10-CM | POA: Diagnosis not present

## 2020-10-16 DIAGNOSIS — R339 Retention of urine, unspecified: Secondary | ICD-10-CM | POA: Diagnosis not present

## 2020-10-16 DIAGNOSIS — Z7409 Other reduced mobility: Secondary | ICD-10-CM | POA: Diagnosis not present

## 2020-10-16 DIAGNOSIS — E1165 Type 2 diabetes mellitus with hyperglycemia: Secondary | ICD-10-CM | POA: Diagnosis not present

## 2020-10-17 ENCOUNTER — Encounter: Payer: Self-pay | Admitting: Internal Medicine

## 2020-10-17 ENCOUNTER — Other Ambulatory Visit: Payer: Self-pay

## 2020-10-17 ENCOUNTER — Ambulatory Visit (INDEPENDENT_AMBULATORY_CARE_PROVIDER_SITE_OTHER): Payer: Medicare Other | Admitting: Internal Medicine

## 2020-10-17 VITALS — BP 122/68 | HR 70

## 2020-10-17 DIAGNOSIS — I10 Essential (primary) hypertension: Secondary | ICD-10-CM

## 2020-10-17 DIAGNOSIS — D510 Vitamin B12 deficiency anemia due to intrinsic factor deficiency: Secondary | ICD-10-CM | POA: Diagnosis not present

## 2020-10-17 DIAGNOSIS — J690 Pneumonitis due to inhalation of food and vomit: Secondary | ICD-10-CM | POA: Diagnosis not present

## 2020-10-17 DIAGNOSIS — Z7189 Other specified counseling: Secondary | ICD-10-CM

## 2020-10-17 DIAGNOSIS — I6601 Occlusion and stenosis of right middle cerebral artery: Secondary | ICD-10-CM

## 2020-10-17 DIAGNOSIS — E538 Deficiency of other specified B group vitamins: Secondary | ICD-10-CM

## 2020-10-17 MED ORDER — CYANOCOBALAMIN 1000 MCG/ML IJ SOLN: 1000.0000 ug | Freq: Once | INTRAMUSCULAR | Status: DC

## 2020-10-17 NOTE — Assessment & Plan Note (Signed)
Resolved, stable, no further eval or tx needed

## 2020-10-17 NOTE — Assessment & Plan Note (Signed)
BP Readings from Last 3 Encounters:  10/17/20 122/68  09/20/20 113/62  08/21/20 (!) 144/60   Stable, pt to continue medical treatment norvasc, catapres

## 2020-10-17 NOTE — Progress Notes (Signed)
Patient ID: Frank [name], male   DOB: [birth year], 80 y.o.   MRN: [id number]        Chief Complaint: follow up recent cvs       HPI:  Frank [name] is a 80 y.o. male here with wife overall doing well without specific new complaints; currently, Pt denies chest pain, increased sob or doe, wheezing, orthopnea, PND, increased LE swelling, palpitations, dizziness or syncope.  Denies new worsening focal neuro s/s,  Pt denies polydipsia, polyuria,  Pt denies fever, wt loss, night sweats, loss of appetite, or other constitutional symptoms  Denies urinary symptoms such as dysuria, frequency, urgency, flank pain, hematuria or n/v, fever, chills.    Patient presented to [facility] on 2/28 via EMS due to difficulty standing and walking and having a Right facial droop at Rehab that morning. CT showed showed right MCA M2 branch occlusion. MR angio showed Large acute/subacute left-sided infarct with parenchymal hemorrhage measuring over 4 cm. PH2 hemorrhage. Due to his history of SDH tPA was not given. He was taken by IR and they preformed a revascularization of occluded dominant right MCA inferior division of distal M2/3 with placement of stent and right ICA baloon angioplasty. He developed Cerebral Edema requiring 3% NaCl and Dysphagia requiring a CorTrak placement, which was eventually weaned off of. Due to a Seizure history he was on Keppra but this was reduced to due to somnolence. PT/OT recommended CIR.  In the admission, he required treatment for aspiration pneumonia and respiratory failure.  His diabetes type 2, which was uncontrolled, was also treated during this admission.  He has chronic kidney disease 3B, which worsened during admission and was treated with fluids and is now at baseline.  He required aggressive blood pressure management due to revascularization of the right carotid and is now on his home medications with adequate blood pressure control.  Goal blood pressure should be kept  normotensive.  He is currently on dual antiplatelets-with the knowledge that he had had bilateral subdural hematomas, which would otherwise have precluded him from aggressive antiplatelet or anticoagulant therapy, but given his recent right carotid stenting, he will continue on dual antiplatelet therapy.  He will follow-up with outpatient neurology in outpatient neuro interventional rad as well.   Wt Readings from Last 3 Encounters:  09/20/20 205 lb 0.4 oz (93 kg)  08/21/20 212 lb 12.8 oz (96.5 kg)  08/19/20 211 lb 9.6 oz (96 kg)   BP Readings from Last 3 Encounters:  10/17/20 122/68  09/20/20 113/62  08/21/20 (!) 144/60         Past Medical History:  Diagnosis Date  . Allergy    Rhinitis  . Anemia    NOS iron deficient and B12 deficient  . Arthritis   . 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 Diabetes mellitus    Type 2  . GERD (gastroesophageal reflux disease)   . Hyperlipidemia   . Hypertension   . 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 OSA (obstructive sleep apnea)    cpap  . TBI (traumatic brain injury) [facility])    Past Surgical History:  Procedure Laterality Date  . APPENDECTOMY    . arm fracture Left 2011   with hardware  . BURR HOLE Bilateral 08/04/2017   Procedure: [name] Flirt;  Surgeon: [name], MD;  Location: [place];  Service: Neurosurgery;  Laterality: Bilateral;  . COLONOSCOPY    . CRANIOTOMY Left 12/01/2017   Procedure: Trudee Kuster Holes CRANIOTOMY HEMATOMA EVACUATION SUBDURAL;  Surgeon: [name], MD;  Location: [name];  Service: Neurosurgery;  Laterality: Left;  . ESOPHAGOGASTRODUODENOSCOPY  2006   gastritis  . IR PERCUTANEOUS ART THROMBECTOMY/INFUSION INTRACRANIAL INC DIAG ANGIO  09/09/2020  . PACEMAKER IMPLANT N/A 05/14/2020   Procedure: PACEMAKER IMPLANT;  Surgeon: [name], MD;  Location: [place] [facility];  Service: Cardiovascular;  Laterality: N/A;  . RADIOLOGY WITH ANESTHESIA N/A 09/09/2020   Procedure: IR WITH ANESTHESIA;  Surgeon: Radiologist,  Medication, MD;  Location: [place];  Service: Radiology;  Laterality: N/A;  . ROTATOR CUFF REPAIR    . SEPTOPLASTY  1959   Deviated Septum  . 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 He smoked 1.00 pack per day. He has never used smokeless tobacco. He reports current alcohol use of about 2.0 standard drinks of alcohol per week. He reports that he does not use drugs. family history includes Allergies in his father; Breast cancer in his mother; Coronary artery disease in his brother; Heart disease in his mother; Stroke in his father. Allergies  Allergen Reactions  . Lisinopril Cough  . Invokana [Canagliflozin] Other (See Comments)    Stopped by MD  . Penicillins Other (See Comments)    Allergic as a child.  Patient does not remember reaction.  Has had cephalasporins without problems.  . Sulfamethoxazole Other (See Comments)    Unknown reaction- from childhood   Current Outpatient Medications on File Prior to Visit  Medication Sig Dispense Refill  . amLODipine (NORVASC) 10 MG tablet Take 1 tablet (10 mg total) by mouth daily.    [name] aspirin 81 MG chewable tablet Chew 1 tablet (81 mg total) by mouth daily.    [name] atorvastatin (LIPITOR) 20 MG tablet Take by mouth.    [name] azelastine (ASTELIN) 0.1 % nasal spray Place 1 spray into both nostrils 2 (two) times daily. Use in each nostril as directed 90 mL 1  . cloNIDine (CATAPRES - DOSED IN MG/24 HR) 0.2 mg/24hr patch Place 1 patch (0.2 mg total) onto the skin once a week.    . doxazosin (CARDURA) 4 MG tablet Take by mouth.    [name] FLUoxetine (PROZAC) 20 MG capsule TAKE 1 CAPSULE BY MOUTH  DAILY 90 capsule 1  . glipiZIDE (GLUCOTROL XL) 10 MG 24 hr tablet TAKE 1 TABLET BY MOUTH  DAILY 90 tablet 0  . insulin glargine, 2 Unit Dial, (TOUJEO MAX SOLOSTAR) 300 UNIT/ML Solostar Pen Inject 10 Units into the skin daily. 6 mL 1  . Insulin Pen Needle 32G X 6 MM MISC 1 Act by Does not apply route daily. 100 each 1  .  levETIRAcetam (KEPPRA) 500 MG tablet Take 500 mg by mouth 2 (two) times daily.    [name] loratadine (CLARITIN) 10 MG tablet Take 1 tablet (10 mg total) by mouth every evening.    . pantoprazole (PROTONIX) 40 MG tablet TAKE 1 TABLET BY MOUTH  DAILY 90 tablet 1  . PRESCRIPTION MEDICATION CPAP- At bedtime    . ticagrelor (BRILINTA) 90 MG TABS tablet Take 0.5 tablets (45 mg total) by mouth 2 (two) times daily. 60 tablet   . triamcinolone cream (KENALOG) 0.1 % Apply 1 application topically daily as needed (to affected areas).     [name] atorvastatin (LIPITOR) 10 MG tablet Take 1 tablet (10 mg total) by mouth daily. (Patient not taking: Reported on 10/17/2020)    . Carbonyl Iron 25 MG TABS Take 25 mg by  mouth daily with breakfast.  (Patient not taking: Reported on 10/17/2020)    . doxazosin (CARDURA) 2 MG tablet Take 1 tablet (2 mg total) by mouth at bedtime. (Patient not taking: Reported on 10/17/2020)    . Glucagon (GVOKE HYPOPEN 2-PACK) 1 MG/0.2ML SOAJ Inject 1 Act into the skin daily as needed. (Patient taking differently: Inject 1 each into the skin as needed (low blood sugar).) 2 mL 5  . levETIRAcetam (KEPPRA) 100 MG/ML solution Take 5 mLs (500 mg total) by mouth 2 (two) times daily. (Patient not taking: Reported on 10/17/2020) 473 mL 12  . solifenacin (VESICARE) 10 MG tablet TAKE 1 TABLET BY MOUTH  DAILY (Patient not taking: Reported on 10/17/2020) 90 tablet 1  . telmisartan (MICARDIS) 20 MG tablet TAKE 1 TABLET BY MOUTH  DAILY (Patient not taking: Reported on 10/17/2020) 90 tablet 1   Current Facility-Administered Medications on File Prior to Visit  Medication Dose Route Frequency Provider Last Rate Last Admin  . cyanocobalamin ((VITAMIN B-12)) injection 1,000 mcg  1,000 mcg Intramuscular Q30 days Janith Lima, MD   1,000 mcg at 10/17/20 1400        ROS:  All others reviewed and negative.  Objective        PE:  BP 122/68 (BP Location: Left Arm, Patient Position: Sitting, Cuff Size: Large)   Pulse 70   SpO2  96%                 Constitutional: Pt appears in NAD               HENT: Head: NCAT.                Right Ear: External ear normal.                 Left Ear: External ear normal.                Eyes: . Pupils are equal, round, and reactive to light. Conjunctivae and EOM are normal               Nose: without d/c or deformity               Neck: Neck supple. Gross normal ROM               Cardiovascular: Normal rate and regular rhythm.                 Pulmonary/Chest: Effort normal and breath sounds without rales or wheezing.                Abd:  Soft, NT, ND, + BS, no organomegaly               Neurological: Pt is alert. At baseline orientation, motor grossly intact               Skin: Skin is warm. No rashes, no other new lesions, LE edema - trace bilat              Psychiatric: Pt behavior is normal without agitation   Micro: none  Cardiac tracings I have personally interpreted today:  none  Pertinent Radiological findings (summarize): none   Lab Results  Component Value Date   WBC 6.2 09/19/2020   HGB 9.4 (L) 09/19/2020   HCT 29.4 (L) 09/19/2020   PLT 122 (L) 09/19/2020   GLUCOSE 157 (H) 09/19/2020   CHOL 107 09/10/2020   TRIG 77 09/13/2020   HDL 36 (  L) 09/10/2020   LDLCALC 29 09/10/2020   ALT 78 (H) 09/19/2020   AST 52 (H) 09/19/2020   NA 137 09/19/2020   K 4.7 09/19/2020   CL 99 09/19/2020   CREATININE 1.44 (H) 09/19/2020   BUN 34 (H) 09/19/2020   CO2 32 09/19/2020   TSH 1.479 05/14/2020   PSA 1.08 07/10/2015   INR 1.0 09/09/2020   HGBA1C 7.8 (H) 09/10/2020   MICROALBUR 67.2 04/23/2020   Assessment/Plan:  Frank [name] is a 80 y.o. White or Caucasian [1] male with  has a past medical history of Allergy, Anemia, Arthritis, AV block, Mobitz 2 (05/14/2020), Diabetes mellitus, GERD (gastroesophageal reflux disease), Hyperlipidemia, Hypertension, Neuropathy (2001), OSA (obstructive sleep apnea), and TBI (traumatic brain injury) ([place]).  Middle cerebral artery  embolism, right Stable, cont current dual antiplt, f/u neurology as planned  Aspiration pneumonia ([name]) Resolved, stable, no further eval or tx needed  B12 deficiency anemia For b12 IM today, then monthly to continue  Essential hypertension BP Readings from Last 3 Encounters:  10/17/20 122/68  09/20/20 113/62  08/21/20 (!) 144/60   Stable, pt to continue medical treatment norvasc, catapres   DNR (do not resuscitate) discussion Pt states wishes to have out of hosp DNR form signed as does not want BLS or ACLS measures done in the home if EMS caled  Followup: Return in about 3 months (around 01/16/2021) for to Dr Ronnald Ramp.  Cathlean Cower, MD 10/17/2020 7:58 PM [facility]

## 2020-10-17 NOTE — Assessment & Plan Note (Signed)
For b12 IM today, then monthly to continue

## 2020-10-17 NOTE — Assessment & Plan Note (Signed)
Pt states wishes to have out of hosp DNR form signed as does not want BLS or ACLS measures done in the home if EMS caled

## 2020-10-17 NOTE — Assessment & Plan Note (Signed)
Stable, cont current dual antiplt, f/u neurology as planned

## 2020-10-17 NOTE — Patient Instructions (Addendum)
Your out of hospital DNR form is signed for you  Please continue your plan for the PT at home  Please continue all other medications as before  Please have the pharmacy call with any other refills you may need.  Please keep your appointments with your specialists as you may have planned - Neurology  Please make an Appointment to return in 3 months to Dr Ronnald Ramp

## 2020-10-18 ENCOUNTER — Telehealth: Payer: Self-pay | Admitting: Internal Medicine

## 2020-10-18 NOTE — Telephone Encounter (Signed)
Patients wife calling, states [facility] put the patient on some new medications and she is wondering about getting these sent to [facility]. Unable to tell me names of medications.  [phone]

## 2020-10-20 NOTE — Progress Notes (Deleted)
NEUROLOGY CONSULTATION NOTE  Maverik Foot MRN: [id number] DOB: ***  Referring provider: *** Primary care provider: ***  Reason for consult:  ***  Assessment/Plan:   1.  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  Essential hypertension 3.  Type 2 diabetes mellitus 4.  Coronary artery disease 5.  OSA on CPAP 6.  History of chronic bilateral subdural hematoma 7.  History of seizure ***  ***   Subjective:  Frank [name] is an 80 year old ***-handed male with *** who presents for right MCA stroke.  History supplemented by hospital records.  He was admitted to [facility] on 09/09/2020 as code stroke presenting with difficulty standing and walking, dizziness and right facial droop.  CT head and CTA head and neck showed posterior right MCA M2 branch occlusion with superimposed bulky calcified plaque at the right ICA origin with tandem high-grade stenosis.  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  MRI/MRA head showed large acute/subacute left-sided infarct with parenchymal hemorrhage measuring over 4 cm, PH2 hemorrhage, element of acute right SAH, and chronic bilateral subdural hematomas.  Due to prior history of  Bilateral SDH, tPA was not given.  He underwent revascularization of occluded dominant right MCA inferior division of distal M2/3 with placement of stent and right ICA balloon angioplasty. He developed cerebral edema requiring 3% NaCl and dysphagia requiring CorTrak placement.  Due to prior history of seizure, he was placed on Keppra which subsequently was reduced due to somnolence.  EEG showed generalized and lateralized right hemispheric slowing and Breach artifact over the left centro-temporal region.  Echocardiogram showed EF 65-70% with no cardiac source of embolism.   Hospitalization was complicated by aspiration pneumonia and respiratory failure.  He was placed on dual antiplatelet therapy (ASA 81mg  daily and Brilinta 45mg  BID)   CT/CT perfusion of head personally reviewed showed emergent posterior right MCA distal M2 branch occlusion at its bifurcation with bulky calcified plaque and high-grade stenosis of the right ICA origin.  MRI of brain showed large acute/subacute left-sided infarct ***        PAST MEDICAL HISTORY: Past Medical History:  Diagnosis Date  . Allergy    Rhinitis  . Anemia    NOS iron deficient and B12 deficient  . Arthritis   . 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 Diabetes mellitus    Type 2  . GERD (gastroesophageal reflux disease)   . Hyperlipidemia   . Hypertension   . 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 OSA (obstructive sleep apnea)    cpap  . TBI (traumatic brain injury) ([place])     PAST SURGICAL HISTORY: Past Surgical History:  Procedure Laterality Date  . APPENDECTOMY    . arm fracture Left 2011   with hardware  . BURR HOLE Bilateral 08/04/2017   Procedure: Haskell Flirt;  Surgeon: [name], MD;  Location: [place];  Service: Neurosurgery;  Laterality: Bilateral;  . COLONOSCOPY    . CRANIOTOMY Left 12/01/2017   Procedure: Trudee Kuster Holes CRANIOTOMY HEMATOMA EVACUATION SUBDURAL;  Surgeon: [name], MD;  Location: [place];  Service: Neurosurgery;  Laterality: Left;  . ESOPHAGOGASTRODUODENOSCOPY  2006   gastritis  . IR PERCUTANEOUS ART THROMBECTOMY/INFUSION INTRACRANIAL INC DIAG ANGIO  09/09/2020  . PACEMAKER IMPLANT N/A 05/14/2020   Procedure: PACEMAKER IMPLANT;  Surgeon: [name],  MD;  Location: [place] [facility];  Service: Cardiovascular;  Laterality: N/A;  . RADIOLOGY WITH ANESTHESIA N/A 09/09/2020   Procedure: IR WITH ANESTHESIA;  Surgeon: Radiologist, Medication, MD;  Location: [place];  Service: Radiology;  Laterality: N/A;  . ROTATOR CUFF REPAIR    . SEPTOPLASTY  1959   Deviated Septum  . 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 amLODipine (NORVASC) 10 MG tablet Take 1 tablet (10 mg total) by mouth daily.    [name] aspirin 81 MG chewable tablet Chew 1 tablet (81 mg total) by mouth daily.    [name] atorvastatin (LIPITOR) 10 MG tablet Take 1 tablet (10 mg total) by mouth daily. (Patient not taking: Reported on 10/17/2020)    . atorvastatin (LIPITOR) 20 MG tablet Take by mouth.    [name] azelastine (ASTELIN) 0.1 % nasal spray Place 1 spray into both nostrils 2 (two) times daily. Use in each nostril as directed 90 mL 1  . Carbonyl Iron 25 MG TABS Take 25 mg by mouth daily with breakfast.  (Patient not taking: Reported on 10/17/2020)    . cloNIDine (CATAPRES - DOSED IN MG/24 HR) 0.2 mg/24hr patch Place 1 patch (0.2 mg total) onto the skin once a week.    . doxazosin (CARDURA) 2 MG tablet Take 1 tablet (2 mg total) by mouth at bedtime. (Patient not taking: Reported on 10/17/2020)    . doxazosin (CARDURA) 4 MG tablet Take by mouth.    [name] FLUoxetine (PROZAC) 20 MG capsule TAKE 1 CAPSULE BY MOUTH  DAILY 90 capsule 1  . glipiZIDE (GLUCOTROL XL) 10 MG 24 hr tablet TAKE 1 TABLET BY MOUTH  DAILY 90 tablet 0  . Glucagon (GVOKE HYPOPEN 2-PACK) 1 MG/0.2ML SOAJ Inject 1 Act into the skin daily as needed. (Patient taking differently: Inject 1 each into the skin as needed (low blood sugar).) 2 mL 5  . insulin glargine, 2 Unit Dial, (TOUJEO MAX SOLOSTAR) 300 UNIT/ML Solostar Pen Inject 10 Units into the skin daily. 6 mL 1  . Insulin Pen Needle 32G X 6 MM MISC 1 Act by Does not apply route daily. 100 each 1  . levETIRAcetam (KEPPRA) 100 MG/ML solution Take 5 mLs (500 mg total) by mouth 2 (two) times daily. (Patient not taking: Reported on 10/17/2020) 473 mL 12  . levETIRAcetam (KEPPRA) 500 MG tablet Take 500 mg by mouth 2 (two) times daily.    [name] loratadine (CLARITIN) 10 MG tablet Take 1 tablet (10 mg total) by mouth every evening.    .  pantoprazole (PROTONIX) 40 MG tablet TAKE 1 TABLET BY MOUTH  DAILY 90 tablet 1  . PRESCRIPTION MEDICATION CPAP- At bedtime    . solifenacin (VESICARE) 10 MG tablet TAKE 1 TABLET BY MOUTH  DAILY (Patient not taking: Reported on 10/17/2020) 90 tablet 1  . telmisartan (MICARDIS) 20 MG tablet TAKE 1 TABLET BY MOUTH  DAILY (Patient not taking: Reported on 10/17/2020) 90 tablet 1  . ticagrelor (BRILINTA) 90 MG TABS tablet Take 0.5 tablets (45 mg total) by mouth 2 (two) times daily. 60 tablet   . triamcinolone cream (KENALOG) 0.1 % Apply 1 application topically daily as needed (to affected areas).      Current Facility-Administered Medications on File Prior to Visit  Medication Dose Route Frequency Provider Last Rate Last Admin  . cyanocobalamin ((VITAMIN B-12)) injection 1,000 mcg  1,000 mcg Intramuscular Q30  days Janith Lima, MD   1,000 mcg at 10/17/20 1400  . cyanocobalamin ((VITAMIN B-12)) injection 1,000 mcg  1,000 mcg Intramuscular Once Biagio Borg, MD        ALLERGIES: Allergies  Allergen Reactions  . Lisinopril Cough  . Invokana [Canagliflozin] Other (See Comments)    Stopped by MD  . Penicillins Other (See Comments)    Allergic as a child.  Patient does not remember reaction.  Has had cephalasporins without problems.  . Sulfamethoxazole Other (See Comments)    Unknown reaction- from childhood    FAMILY HISTORY: Family History  Problem Relation Age of Onset  . Heart disease Mother   . Breast cancer Mother   . Stroke Father   . Allergies Father        Father and children  . Coronary artery disease Brother   . Diabetes Neg Hx   . Colon cancer Neg Hx     Objective:  *** General: No acute distress.  Patient appears well-groomed.   Head:  Normocephalic/atraumatic Eyes:  fundi examined but not visualized Neck: supple, no paraspinal tenderness, full range of motion Back: No paraspinal tenderness Heart: regular rate and rhythm Lungs: Clear to auscultation  bilaterally. Vascular: No carotid bruits. Neurological Exam: Mental status: alert and oriented to person, place, and time, recent and remote memory intact, fund of knowledge intact, attention and concentration intact, speech fluent and not dysarthric, language intact. Cranial nerves: CN I: not tested CN II: pupils equal, round and reactive to light, visual fields intact CN III, IV, VI:  full range of motion, no nystagmus, no ptosis CN V: facial sensation intact. CN VII: upper and lower face symmetric CN VIII: hearing intact CN IX, X: gag intact, uvula midline CN XI: sternocleidomastoid and trapezius muscles intact CN XII: tongue midline Bulk & Tone: normal, no fasciculations. Motor:  muscle strength 5/5 throughout Sensation:  Pinprick, temperature and vibratory sensation intact. Deep Tendon Reflexes:  2+ throughout,  toes downgoing.   Finger to nose testing:  Without dysmetria.   Heel to shin:  Without dysmetria.   Gait:  Normal station and stride.  Romberg negative.    Thank you for allowing me to take part in the care of this patient.  Metta Clines, DO  CC: Scarlette Calico, MD

## 2020-10-21 ENCOUNTER — Ambulatory Visit: Payer: Medicare Other | Admitting: Neurology

## 2020-10-23 ENCOUNTER — Ambulatory Visit: Payer: Medicare Other

## 2020-10-23 NOTE — Telephone Encounter (Signed)
Called pt, Frank [name].    Needing clarification on meds. I do not see any new prescribed meds listed in Epic.

## 2020-10-28 DIAGNOSIS — D631 Anemia in chronic kidney disease: Secondary | ICD-10-CM | POA: Diagnosis not present

## 2020-10-28 DIAGNOSIS — I639 Cerebral infarction, unspecified: Secondary | ICD-10-CM | POA: Diagnosis not present

## 2020-10-28 DIAGNOSIS — I129 Hypertensive chronic kidney disease with stage 1 through stage 4 chronic kidney disease, or unspecified chronic kidney disease: Secondary | ICD-10-CM | POA: Diagnosis not present

## 2020-10-28 DIAGNOSIS — N1832 Chronic kidney disease, stage 3b: Secondary | ICD-10-CM | POA: Diagnosis not present

## 2020-10-28 DIAGNOSIS — N2581 Secondary hyperparathyroidism of renal origin: Secondary | ICD-10-CM | POA: Diagnosis not present

## 2020-10-31 ENCOUNTER — Ambulatory Visit (HOSPITAL_COMMUNITY): Admission: RE | Admit: 2020-10-31 | Payer: Medicare Other | Source: Ambulatory Visit

## 2020-10-31 ENCOUNTER — Telehealth (HOSPITAL_COMMUNITY): Payer: Self-pay

## 2020-10-31 NOTE — Telephone Encounter (Signed)
Called to reschedule consult, no answer, left vm. AW  

## 2020-11-04 ENCOUNTER — Ambulatory Visit (HOSPITAL_COMMUNITY)
Admission: RE | Admit: 2020-11-04 | Discharge: 2020-11-04 | Disposition: A | Discharge status: Home / Self Care | Payer: Medicare Other | Source: Ambulatory Visit | Attending: Student | Admitting: Student

## 2020-11-04 ENCOUNTER — Telehealth: Payer: Self-pay

## 2020-11-04 ENCOUNTER — Other Ambulatory Visit: Payer: Self-pay

## 2020-11-04 ENCOUNTER — Telehealth: Payer: Self-pay | Admitting: Internal Medicine

## 2020-11-04 DIAGNOSIS — I6601 Occlusion and stenosis of right middle cerebral artery: Secondary | ICD-10-CM

## 2020-11-04 NOTE — Progress Notes (Addendum)
Chronic Care Management Pharmacy Assistant   Name: Frank [name]  MRN: [id number] DOB: 15-Jun-1941   Reason for Encounter: Disease State-Hypertension/Diabetes    Recent office visits:   10/17/20 Cathlean Cower, MD - Vitamin B12 deficiency anemia - 1,000 mcg, Intramuscular, Every 30 days, First dose on Wed 07/17/20 at 1030, For 5 doses.  08/21/20 Ronnald Ramp (PCP) - Annual Exam, B-12 injection was given. Patient instructed to f/u in 3 mos.  Recent consult visits:        10/14/20 [facility] (Radiology) - Stroke. Today's MBS findings                   revealed mild pharyngeal phase dysphagia. SLP  recommends a         diet of soft and bite sized foods with thin liquids  Hospital visits:  Medication Reconciliation was completed by comparing discharge summary, patient's EMR and Pharmacy list, and upon discussion with patient.  Admitted to the hospital on 09/09/20 due to stroke. Discharge date was 09/20/20. Discharged from [place]?Medications Started at [facility] Discharge:??  Aspirin 81 mg  cloNIDine patch 0.2 mg/24h  levETIRAcetam 500 mg BID  loratadine 10 mg prn  ticagrelor 45 mg BID  Medication Changes at Hospital Discharge:    -Changed how you take;   amLODipine 10 mg daily  atorvastatin 10 mg daily  doxazosin 4 mg daily  Medications Discontinued at Hospital Discharge:  -Stopped acetaminophen 325 MG tablet   levocetirizine 5 MG tablet  mupirocin ointment 2 %   psyllium 58.6 % powder   Medications that remain the same after Hospital Discharge:??  -All other medications will remain the same.    Medications: Outpatient Encounter Medications as of 11/04/2020  Medication Sig   amLODipine (NORVASC) 10 MG tablet Take 1 tablet (10 mg total) by mouth daily.   aspirin 81 MG chewable tablet Chew 1 tablet (81 mg total) by mouth daily.   atorvastatin (LIPITOR) 10 MG tablet Take 1 tablet (10 mg total) by mouth daily. (Patient not taking: Reported on 10/17/2020)   atorvastatin  (LIPITOR) 20 MG tablet Take by mouth.   azelastine (ASTELIN) 0.1 % nasal spray Place 1 spray into both nostrils 2 (two) times daily. Use in each nostril as directed   Carbonyl Iron 25 MG TABS Take 25 mg by mouth daily with breakfast.  (Patient not taking: Reported on 10/17/2020)   cloNIDine (CATAPRES - DOSED IN MG/24 HR) 0.2 mg/24hr patch Place 1 patch (0.2 mg total) onto the skin once a week.   doxazosin (CARDURA) 2 MG tablet Take 1 tablet (2 mg total) by mouth at bedtime. (Patient not taking: Reported on 10/17/2020)   doxazosin (CARDURA) 4 MG tablet Take by mouth.   FLUoxetine (PROZAC) 20 MG capsule TAKE 1 CAPSULE BY MOUTH  DAILY   glipiZIDE (GLUCOTROL XL) 10 MG 24 hr tablet TAKE 1 TABLET BY MOUTH  DAILY   Glucagon (GVOKE HYPOPEN 2-PACK) 1 MG/0.2ML SOAJ Inject 1 Act into the skin daily as needed. (Patient taking differently: Inject 1 each into the skin as needed (low blood sugar).)   insulin glargine, 2 Unit Dial, (TOUJEO MAX SOLOSTAR) 300 UNIT/ML Solostar Pen Inject 10 Units into the skin daily.   Insulin Pen Needle 32G X 6 MM MISC 1 Act by Does not apply route daily.   levETIRAcetam (KEPPRA) 100 MG/ML solution Take 5 mLs (500 mg total) by mouth 2 (two) times daily. (Patient not taking: Reported on 10/17/2020)  levETIRAcetam (KEPPRA) 500 MG tablet Take 500 mg by mouth 2 (two) times daily.   loratadine (CLARITIN) 10 MG tablet Take 1 tablet (10 mg total) by mouth every evening.   pantoprazole (PROTONIX) 40 MG tablet TAKE 1 TABLET BY MOUTH  DAILY   PRESCRIPTION MEDICATION CPAP- At bedtime   solifenacin (VESICARE) 10 MG tablet TAKE 1 TABLET BY MOUTH  DAILY (Patient not taking: Reported on 10/17/2020)   telmisartan (MICARDIS) 20 MG tablet TAKE 1 TABLET BY MOUTH  DAILY (Patient not taking: Reported on 10/17/2020)   ticagrelor (BRILINTA) 90 MG TABS tablet Take 0.5 tablets (45 mg total) by mouth 2 (two) times daily.   triamcinolone cream (KENALOG) 0.1 % Apply 1 application topically daily as needed (to affected  areas).    Facility-Administered Encounter Medications as of 11/04/2020  Medication   cyanocobalamin ((VITAMIN B-12)) injection 1,000 mcg   cyanocobalamin ((VITAMIN B-12)) injection 1,000 mcg     Reviewed chart prior to disease state call. Spoke with patient regarding BP and DM.  Recent Office Vitals: BP Readings from Last 3 Encounters:  10/17/20 122/68  09/20/20 113/62  08/21/20 (!) 144/60   Pulse Readings from Last 3 Encounters:  10/17/20 70  09/20/20 80  08/21/20 68    Wt Readings from Last 3 Encounters:  09/20/20 205 lb 0.4 oz (93 kg)  08/21/20 212 lb 12.8 oz (96.5 kg)  08/19/20 211 lb 9.6 oz (96 kg)     Kidney Function Lab Results  Component Value Date/Time   CREATININE 1.44 (H) 09/19/2020 04:13 AM   CREATININE 1.42 (H) 09/18/2020 04:31 AM   GFR 36.21 (L) 08/21/2020 09:45 AM   GFRNONAA 49 (L) 09/19/2020 04:13 AM   GFRAA 30 (L) 12/08/2019 10:50 PM    BMP Latest Ref Rng & Units 09/19/2020 09/18/2020 09/17/2020  Glucose 70 - 99 mg/dL 157(H) 130(H) 233(H)  BUN 8 - 23 mg/dL 34(H) 34(H) 38(H)  Creatinine 0.61 - 1.24 mg/dL 1.44(H) 1.42(H) 1.31(H)  Sodium 135 - 145 mmol/L 137 138 137  Potassium 3.5 - 5.1 mmol/L 4.7 4.3 4.0  Chloride 98 - 111 mmol/L 99 100 100  CO2 22 - 32 mmol/L 32 29 27  Calcium 8.9 - 10.3 mg/dL 8.8(L) 8.8(L) 8.7(L)    Current antihypertensive regimen:  Doxazosin 4 mg - 1/2 tablet at bedtime Amlodipine 10 mg daily Clonidine 0.2 mg/24h patch Telmisartan 20 mg daily  How often are you checking your Blood Pressure? daily   Current home BP readings:   4/25 - 101/51  4/23 - 123/66  4/22 - 121/58  4/21 - 108/61   What recent interventions/DTPs have been made by any provider to improve Blood Pressure control since last CPP Visit:   None noted  Any recent hospitalizations or ED visits since last visit with CPP? Yes    What diet changes have been made to improve Blood Pressure Control?  Patient states he is not on any special diets.    What  exercise is being done to improve your Blood Pressure Control?  Patient states he is not able to exercise at this time, he had a stroke about 3 weeks ago.  Adherence Review: Is the patient currently on ACE/ARB medication? Yes   Does the patient have >5 day gap between last estimated fill dates? No  Doxazosin 4 mg - last fill 10/15/20 30D    Recent Relevant Labs: Lab Results  Component Value Date/Time   HGBA1C 7.8 (H) 09/10/2020 06:06 AM   HGBA1C 7.6 (H) 08/21/2020 09:45 AM  MICROALBUR 67.2 04/23/2020 12:00 AM   MICROALBUR 10.9 (H) 11/27/2019 10:38 AM    Kidney Function Lab Results  Component Value Date/Time   CREATININE 1.44 (H) 09/19/2020 04:13 AM   CREATININE 1.42 (H) 09/18/2020 04:31 AM   GFR 36.21 (L) 08/21/2020 09:45 AM   GFRNONAA 49 (L) 09/19/2020 04:13 AM   GFRAA 30 (L) 12/08/2019 10:50 PM    Current antihyperglycemic regimen:  Glipizide XL 10 mg daily Toujeo 10 units daily  What recent interventions/DTPs have been made to improve glycemic control:  None noted  Have there been any recent hospitalizations or ED visits since last visit with CPP?   Patient was last in the hospital in 09/09/20 for a stroke.  Patient denies hypoglycemic symptoms, including None   Patient denies hyperglycemic symptoms, including  None    How often are you checking your blood sugar? once daily  What are your blood sugars ranging?  Patient checks before meals:  4/25 - 71 4/23 - 150    During the week, how often does your blood glucose drop below 70?  None    Are you checking your feet daily/regularly?   Patient states his feet are fine, he checks them when after his  showers.   Adherence Review: Is the patient currently on a STATIN medication? Yes    Is the patient currently on ACE/ARB medication? Yes    Does the patient have >5 day gap between last estimated fill dates? No  Glipizide XL - last fill 10/16/20 30D   Star Rating Drugs: Atorvastatin 20 mg - last fill  09/28/20 90D Telmisartan - last fill 10/09/20 [place], [facility] Clinical Pharmacists Assistant [phone]

## 2020-11-04 NOTE — Progress Notes (Signed)
Referring Physician(s): Ronney Lion  Supervising Physician: Luanne Bras  Patient Status:  [facility]   Chief Complaint: History of acute CVA s/p cerebral arteriogram with emergent mechanical thrombectomy and stent placement of right MCA inferior division M2/M3 occlusion, along with angioplasty of right ICA stenosis, achieving a TICI 3 revascularization via right femoral approach 09/09/2020 by Dr. Estanislado Pandy.  Subjective: Patient presents to [facility] today for an outpatient follow up with Dr. Estanislado Pandy. He is accompanied by his wife and a male caregiver. His main complaint is lethargy which the wife and caregiver thinks might be related to his blood pressure medicine.   Allergies: Lisinopril, Invokana [canagliflozin], Penicillins, and Sulfamethoxazole  Medications: Prior to Admission medications   Medication Sig Start Date End Date Taking? Authorizing Provider  amLODipine (NORVASC) 10 MG tablet Take 1 tablet (10 mg total) by mouth daily. 09/18/20   Briant Cedar, MD  aspirin 81 MG chewable tablet Chew 1 tablet (81 mg total) by mouth daily. 09/18/20   Briant Cedar, MD  atorvastatin (LIPITOR) 10 MG tablet Take 1 tablet (10 mg total) by mouth daily. Patient not taking: Reported on 10/17/2020 09/18/20   Briant Cedar, MD  atorvastatin (LIPITOR) 20 MG tablet Take by mouth. 09/25/20   [provider]  azelastine (ASTELIN) 0.1 % nasal spray Place 1 spray into both nostrils 2 (two) times daily. Use in each nostril as directed 11/27/19   Janith Lima, MD  Carbonyl Iron 25 MG TABS Take 25 mg by mouth daily with breakfast.  Patient not taking: Reported on 10/17/2020    [provider]  cloNIDine (CATAPRES - DOSED IN MG/24 HR) 0.2 mg/24hr patch Place 1 patch (0.2 mg total) onto the skin once a week. 09/18/20   Briant Cedar, MD  doxazosin (CARDURA) 2 MG tablet Take 1 tablet (2 mg total) by mouth at bedtime. Patient not taking: Reported on  10/17/2020 09/18/20   Briant Cedar, MD  doxazosin (CARDURA) 4 MG tablet Take by mouth. 09/25/20   [provider]  FLUoxetine (PROZAC) 20 MG capsule TAKE 1 CAPSULE BY MOUTH  DAILY 09/25/20   Janith Lima, MD  glipiZIDE (GLUCOTROL XL) 10 MG 24 hr tablet TAKE 1 TABLET BY MOUTH  DAILY 09/25/20   Janith Lima, MD  Glucagon (GVOKE HYPOPEN 2-PACK) 1 MG/0.2ML SOAJ Inject 1 Act into the skin daily as needed. Patient taking differently: Inject 1 each into the skin as needed (low blood sugar). 04/30/20   Janith Lima, MD  insulin glargine, 2 Unit Dial, (TOUJEO MAX SOLOSTAR) 300 UNIT/ML Solostar Pen Inject 10 Units into the skin daily. 04/16/20   Janith Lima, MD  Insulin Pen Needle 32G X 6 MM MISC 1 Act by Does not apply route daily. 04/08/20   Janith Lima, MD  levETIRAcetam (KEPPRA) 100 MG/ML solution Take 5 mLs (500 mg total) by mouth 2 (two) times daily. Patient not taking: Reported on 10/17/2020 09/18/20   Briant Cedar, MD  levETIRAcetam (KEPPRA) 500 MG tablet Take 500 mg by mouth 2 (two) times daily. 10/16/20   [provider]  loratadine (CLARITIN) 10 MG tablet Take 1 tablet (10 mg total) by mouth every evening. 09/18/20   Briant Cedar, MD  pantoprazole (PROTONIX) 40 MG tablet TAKE 1 TABLET BY MOUTH  DAILY 10/03/20   Janith Lima, MD  PRESCRIPTION MEDICATION CPAP- At bedtime    [provider]  solifenacin (VESICARE) 10 MG tablet TAKE 1  TABLET BY MOUTH  DAILY Patient not taking: Reported on 10/17/2020 08/09/20   Janith Lima, MD  telmisartan (MICARDIS) 20 MG tablet TAKE 1 TABLET BY MOUTH  DAILY Patient not taking: Reported on 10/17/2020 09/07/20   Janith Lima, MD  ticagrelor (BRILINTA) 90 MG TABS tablet Take 0.5 tablets (45 mg total) by mouth 2 (two) times daily. 09/18/20   Briant Cedar, MD  triamcinolone cream (KENALOG) 0.1 % Apply 1 application topically daily as needed (to affected areas).  07/26/19   [provider]      Vital Signs: There were no vitals taken for this visit.  Physical Exam Constitutional:      General: He is not in acute distress. Pulmonary:     Effort: Pulmonary effort is normal.  Genitourinary:    Comments: Incontinent per wife Musculoskeletal:     Comments: Ambulates with a walker.   Neurological:     Mental Status: He is alert and oriented to person, place, and time.     Cranial Nerves: No dysarthria or facial asymmetry.     Motor: No weakness.     Comments: Per wife, the patient does have occasional memory impairment and/or delayed responses.      Imaging: No results found.  Labs:  CBC: Recent Labs    09/16/20 0340 09/17/20 0349 09/18/20 0431 09/19/20 0413  WBC 6.2 6.6 7.2 6.2  HGB 9.0* 8.6* 9.3* 9.4*  HCT 29.1* 28.3* 29.5* 29.4*  PLT 129* 122* 129* 122*    COAGS: Recent Labs    09/09/20 1101  INR 1.0  APTT 32    BMP: Recent Labs    12/08/19 2250 12/12/19 1450 09/16/20 0340 09/17/20 0349 09/18/20 0431 09/19/20 0413  NA 138   < > 142 137 138 137  K 4.6   < > 4.1 4.0 4.3 4.7  CL 102   < > 109 100 100 99  CO2 25   < > 25 27 29  32  GLUCOSE 136*   < > 211* 233* 130* 157*  BUN 41*   < > 38* 38* 34* 34*  CALCIUM 8.9   < > 8.6* 8.7* 8.8* 8.8*  CREATININE 2.30*   < > 1.35* 1.31* 1.42* 1.44*  GFRNONAA 26*   < > 53* 55* 50* 49*  GFRAA 30*  --   --   --   --   --    < > = values in this interval not displayed.    LIVER FUNCTION TESTS: Recent Labs    08/21/20 0945 09/09/20 1101 09/19/20 0938  BILITOT 0.4 0.7 0.6  AST 15 22 52*  ALT 18 22 78*  ALKPHOS 73 69 75  PROT 6.6 6.4* 5.5*  ALBUMIN 3.9 3.6 2.7*    Assessment and Plan:  History of acute CVA s/p cerebral arteriogram with emergent mechanical thrombectomy and stent placement of right MCA inferior division M2/M3 occlusion, along with angioplasty of right ICA stenosis, achieving a TICI 3 revascularization via right femoral approach 09/09/2020  Per patient's wife the patient does  have some memory impairment and delayed responses. He ambulates with a walker and has a sitter at night, mainly for frequent bathroom visits. [name] is very concerned about him falling. He ambulates with a walker and goes to a gym frequently. He is easily fatigued and the care giver reports low blood pressure since he was started on on a clonidine patch.   NIR recommends follow up with PCP for possible medication adjustments. We  will plan to see the patient in early June for a follow up CTA head/neck. Patient to remain on Brilinta and aspirin.   Electronically Signed: Soyla Dryer, AGACNP-BC [phone] 11/04/2020, 2:33 PM   I spent a total of 25 Minutes at the the patient's bedside AND on the patient's hospital floor or unit, greater than 50% of which was counseling/coordinating care for stroke follow up.

## 2020-11-04 NOTE — Telephone Encounter (Signed)
Yes, that is ok 

## 2020-11-04 NOTE — Telephone Encounter (Signed)
Patients wife called and said that the patient needs to see Dr. Ronnald Ramp soon. She said that one of his doctors thinks he is too pale and wants to run blood work also she said that he is having an issue with one of his legs. She did not say which leg. She was wanting the patient to be seen this week. She can be reached at [phone]

## 2020-11-05 ENCOUNTER — Ambulatory Visit: Payer: Medicare Other | Admitting: Internal Medicine

## 2020-11-05 ENCOUNTER — Encounter: Payer: Self-pay | Admitting: Internal Medicine

## 2020-11-05 ENCOUNTER — Ambulatory Visit (INDEPENDENT_AMBULATORY_CARE_PROVIDER_SITE_OTHER): Payer: Medicare Other

## 2020-11-05 ENCOUNTER — Ambulatory Visit (INDEPENDENT_AMBULATORY_CARE_PROVIDER_SITE_OTHER): Payer: Medicare Other | Admitting: Internal Medicine

## 2020-11-05 ENCOUNTER — Other Ambulatory Visit: Payer: Self-pay

## 2020-11-05 ENCOUNTER — Telehealth: Payer: Self-pay | Admitting: Internal Medicine

## 2020-11-05 ENCOUNTER — Emergency Department (HOSPITAL_COMMUNITY)
Admission: EM | Admit: 2020-11-05 | Discharge: 2020-11-06 | Disposition: A | Discharge status: Home / Self Care | Payer: Medicare Other | Attending: Emergency Medicine | Admitting: Emergency Medicine

## 2020-11-05 ENCOUNTER — Encounter (HOSPITAL_COMMUNITY): Payer: Self-pay | Admitting: Emergency Medicine

## 2020-11-05 VITALS — BP 118/58 | HR 74 | Temp 97.7°F | Resp 16 | Ht 69.0 in

## 2020-11-05 DIAGNOSIS — Z743 Need for continuous supervision: Secondary | ICD-10-CM | POA: Diagnosis not present

## 2020-11-05 DIAGNOSIS — D539 Nutritional anemia, unspecified: Secondary | ICD-10-CM

## 2020-11-05 DIAGNOSIS — I129 Hypertensive chronic kidney disease with stage 1 through stage 4 chronic kidney disease, or unspecified chronic kidney disease: Secondary | ICD-10-CM | POA: Insufficient documentation

## 2020-11-05 DIAGNOSIS — R350 Frequency of micturition: Secondary | ICD-10-CM | POA: Diagnosis not present

## 2020-11-05 DIAGNOSIS — F0280 Dementia in other diseases classified elsewhere without behavioral disturbance: Secondary | ICD-10-CM | POA: Diagnosis not present

## 2020-11-05 DIAGNOSIS — R059 Cough, unspecified: Secondary | ICD-10-CM

## 2020-11-05 DIAGNOSIS — Z20822 Contact with and (suspected) exposure to covid-19: Secondary | ICD-10-CM | POA: Diagnosis not present

## 2020-11-05 DIAGNOSIS — Z87891 Personal history of nicotine dependence: Secondary | ICD-10-CM | POA: Insufficient documentation

## 2020-11-05 DIAGNOSIS — R5383 Other fatigue: Secondary | ICD-10-CM | POA: Insufficient documentation

## 2020-11-05 DIAGNOSIS — Z794 Long term (current) use of insulin: Secondary | ICD-10-CM | POA: Insufficient documentation

## 2020-11-05 DIAGNOSIS — I10 Essential (primary) hypertension: Secondary | ICD-10-CM

## 2020-11-05 DIAGNOSIS — R7989 Other specified abnormal findings of blood chemistry: Secondary | ICD-10-CM | POA: Insufficient documentation

## 2020-11-05 DIAGNOSIS — N183 Chronic kidney disease, stage 3 unspecified: Secondary | ICD-10-CM | POA: Diagnosis not present

## 2020-11-05 DIAGNOSIS — Z950 Presence of cardiac pacemaker: Secondary | ICD-10-CM | POA: Diagnosis not present

## 2020-11-05 DIAGNOSIS — Z7982 Long term (current) use of aspirin: Secondary | ICD-10-CM | POA: Diagnosis not present

## 2020-11-05 DIAGNOSIS — D508 Other iron deficiency anemias: Secondary | ICD-10-CM

## 2020-11-05 DIAGNOSIS — R531 Weakness: Secondary | ICD-10-CM | POA: Diagnosis not present

## 2020-11-05 DIAGNOSIS — D510 Vitamin B12 deficiency anemia due to intrinsic factor deficiency: Secondary | ICD-10-CM

## 2020-11-05 DIAGNOSIS — R0902 Hypoxemia: Secondary | ICD-10-CM | POA: Diagnosis not present

## 2020-11-05 DIAGNOSIS — Z79899 Other long term (current) drug therapy: Secondary | ICD-10-CM | POA: Insufficient documentation

## 2020-11-05 DIAGNOSIS — N1832 Chronic kidney disease, stage 3b: Secondary | ICD-10-CM | POA: Diagnosis not present

## 2020-11-05 DIAGNOSIS — Z7984 Long term (current) use of oral hypoglycemic drugs: Secondary | ICD-10-CM | POA: Insufficient documentation

## 2020-11-05 DIAGNOSIS — E1142 Type 2 diabetes mellitus with diabetic polyneuropathy: Secondary | ICD-10-CM

## 2020-11-05 DIAGNOSIS — E1122 Type 2 diabetes mellitus with diabetic chronic kidney disease: Secondary | ICD-10-CM | POA: Insufficient documentation

## 2020-11-05 LAB — BASIC METABOLIC PANEL
Anion gap: 8 (ref 5–15)
BUN: 43 mg/dL — ABNORMAL HIGH (ref 6–23)
BUN: 42 mg/dL — ABNORMAL HIGH (ref 8–23)
CO2: 29 mEq/L (ref 19–32)
CO2: 28 mmol/L (ref 22–32)
Calcium: 9.2 mg/dL (ref 8.4–10.5)
Calcium: 9.1 mg/dL (ref 8.9–10.3)
Chloride: 102 mEq/L (ref 96–112)
Chloride: 102 mmol/L (ref 98–111)
Creatinine, Ser: 1.71 mg/dL — ABNORMAL HIGH (ref 0.40–1.50)
Creatinine, Ser: 1.74 mg/dL — ABNORMAL HIGH (ref 0.61–1.24)
GFR, Estimated: 39 mL/min — ABNORMAL LOW (ref >60)
GFR: 36.92 mL/min — ABNORMAL LOW (ref >60.00)
Glucose, Bld: 205 mg/dL — ABNORMAL HIGH (ref 70–99)
Glucose, Bld: 138 mg/dL — ABNORMAL HIGH (ref 70–99)
Potassium: 4 mEq/L (ref 3.5–5.1)
Potassium: 4.1 mmol/L (ref 3.5–5.1)
Sodium: 139 mEq/L (ref 135–145)
Sodium: 138 mmol/L (ref 135–145)

## 2020-11-05 LAB — CBC WITH DIFFERENTIAL/PLATELET
Basophils Absolute: 0 10*3/uL (ref 0.0–0.1)
Basophils Relative: 0.6 % (ref 0.0–3.0)
Eosinophils Absolute: 0.3 10*3/uL (ref 0.0–0.7)
Eosinophils Relative: 5.6 % — ABNORMAL HIGH (ref 0.0–5.0)
HCT: 30.4 % — ABNORMAL LOW (ref 39.0–52.0)
Hemoglobin: 9.9 g/dL — ABNORMAL LOW (ref 13.0–17.0)
Lymphocytes Relative: 15.5 % (ref 12.0–46.0)
Lymphs Abs: 0.8 10*3/uL (ref 0.7–4.0)
MCHC: 32.7 g/dL (ref 30.0–36.0)
MCV: 82.1 fl (ref 78.0–100.0)
Monocytes Absolute: 0.6 10*3/uL (ref 0.1–1.0)
Monocytes Relative: 12.7 % — ABNORMAL HIGH (ref 3.0–12.0)
Neutro Abs: 3.3 10*3/uL (ref 1.4–7.7)
Neutrophils Relative %: 65.6 % (ref 43.0–77.0)
Platelets: 140 10*3/uL — ABNORMAL LOW (ref 150.0–400.0)
RBC: 3.7 Mil/uL — ABNORMAL LOW (ref 4.22–5.81)
RDW: 15.5 % (ref 11.5–15.5)
WBC: 5 10*3/uL (ref 4.0–10.5)

## 2020-11-05 LAB — CBC
HCT: 33 % — ABNORMAL LOW (ref 39.0–52.0)
Hemoglobin: 10.4 g/dL — ABNORMAL LOW (ref 13.0–17.0)
MCH: 27.4 pg (ref 26.0–34.0)
MCHC: 31.5 g/dL (ref 30.0–36.0)
MCV: 87.1 fL (ref 80.0–100.0)
Platelets: 147 10*3/uL — ABNORMAL LOW (ref 150–400)
RBC: 3.79 MIL/uL — ABNORMAL LOW (ref 4.22–5.81)
RDW: 14.6 % (ref 11.5–15.5)
WBC: 5.2 10*3/uL (ref 4.0–10.5)
nRBC: 0 % (ref 0.0–0.2)

## 2020-11-05 LAB — IRON: Iron: 38 ug/dL — ABNORMAL LOW (ref 42–165)

## 2020-11-05 LAB — FOLATE: Folate: 15.6 ng/mL (ref >5.9)

## 2020-11-05 LAB — FERRITIN: Ferritin: 68.9 ng/mL (ref 22.0–322.0)

## 2020-11-05 NOTE — ED Triage Notes (Signed)
Pt presents to ED BIB [facility]. Pt c/o weakness and fatigue. Pt reports that his PCP told him his iron and hmg were low. Pt denies any dark stools.

## 2020-11-05 NOTE — Telephone Encounter (Signed)
Patient called and would like a call back in regards to the patients visit on 11-05-20. He was wondering why hospice was presented as an option for the patient. He can be reached at [phone]. Please advise

## 2020-11-05 NOTE — Progress Notes (Signed)
Subjective:  Patient ID: Frank [name], male    DOB: 09-18-1940  Age: 80 y.o. MRN: [id number]  CC: Cough and Anemia  This visit occurred during the SARS-CoV-2 public health emergency.  Safety protocols were in place, including screening questions prior to the visit, additional usage of staff PPE, and extensive cleaning of exam room while observing appropriate contact time as indicated for disinfecting solutions.    HPI Frank [name] presents for f/up -   I worked him in urgently today.  His wife tells me that he saw an interventional radiologist 1 day prior to this visit and the radiologist thought that he looked pale.  He has an extensively positive review of systems including cough productive of clear phlegm, lethargy, weakness, dizziness, lightheadedness, and severe fatigue. 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  Outpatient Medications Prior to Visit  Medication Sig Dispense Refill  . amLODipine (NORVASC) 10 MG tablet Take 1 tablet (10 mg total) by mouth daily.    [name] aspirin 81 MG chewable tablet Chew 1 tablet (81 mg total) by mouth daily.    [name] atorvastatin (LIPITOR) 10 MG tablet Take 1 tablet (10 mg total) by mouth daily.    [name] azelastine (ASTELIN) 0.1 % nasal spray Place 1 spray into both nostrils 2 (two) times daily. Use in each nostril as directed 90 mL 1  . cloNIDine (CATAPRES - DOSED IN MG/24 HR) 0.2 mg/24hr patch Place 1 patch (0.2 mg total) onto the skin once a week.    [name] FLUoxetine (PROZAC) 20 MG capsule TAKE 1 CAPSULE BY MOUTH  DAILY 90 capsule 1  . glipiZIDE (GLUCOTROL XL) 10 MG 24 hr tablet TAKE 1 TABLET BY MOUTH  DAILY 90 tablet 0  . Glucagon (GVOKE HYPOPEN 2-PACK) 1 MG/0.2ML SOAJ Inject 1 Act into the skin daily as needed. (Patient taking differently: Inject 1 each into the skin as needed (low blood sugar).) 2 mL 5  . insulin glargine, 2 Unit Dial, (TOUJEO MAX SOLOSTAR) 300 UNIT/ML Solostar Pen Inject 10 Units into the skin daily. 6 mL 1  . Insulin  Pen Needle 32G X 6 MM MISC 1 Act by Does not apply route daily. 100 each 1  . levETIRAcetam (KEPPRA) 100 MG/ML solution Take 5 mLs (500 mg total) by mouth 2 (two) times daily. 473 mL 12  . levETIRAcetam (KEPPRA) 500 MG tablet Take 500 mg by mouth 2 (two) times daily.    [name] loratadine (CLARITIN) 10 MG tablet Take 1 tablet (10 mg total) by mouth every evening.    . pantoprazole (PROTONIX) 40 MG tablet TAKE 1 TABLET BY MOUTH  DAILY 90 tablet 1  . PRESCRIPTION MEDICATION CPAP- At bedtime    . solifenacin (VESICARE) 10 MG tablet TAKE 1 TABLET BY MOUTH  DAILY 90 tablet 1  . ticagrelor (BRILINTA) 90 MG TABS tablet Take 0.5 tablets (45 mg total) by mouth 2 (two) times daily. 60 tablet   . triamcinolone cream (KENALOG) 0.1 % Apply 1 application topically daily as needed (to affected areas).     [name] atorvastatin (LIPITOR) 20 MG tablet Take by mouth.    Carin Hock Iron 25 MG TABS Take 25 mg by mouth daily with breakfast.    . doxazosin (CARDURA) 2 MG tablet Take 1 tablet (2 mg total) by mouth at bedtime.    [name] doxazosin (CARDURA) 4 MG tablet Take by mouth.    . telmisartan (MICARDIS) 20 MG tablet TAKE 1 TABLET BY MOUTH  DAILY 90 tablet 1  . cyanocobalamin ((VITAMIN B-12)) injection 1,000 mcg     . cyanocobalamin ((VITAMIN B-12)) injection 1,000 mcg      No facility-administered medications prior to visit.    ROS Review of Systems  Constitutional: Positive for fatigue and unexpected weight change (wt loss). Negative for diaphoresis.  HENT: Negative for trouble swallowing and voice change.   Respiratory: Positive for cough. Negative for choking, shortness of breath and wheezing.   Cardiovascular: Negative for chest pain, palpitations and leg swelling.  Gastrointestinal: Negative for abdominal pain, constipation, diarrhea, nausea and vomiting.  Genitourinary: Negative.  Negative for difficulty urinating.  Musculoskeletal: Negative.   Skin: Positive for pallor. Negative for color change.  Neurological:  Positive for dizziness, weakness and light-headedness. Negative for numbness and headaches.  Hematological: Negative for adenopathy. Does not bruise/bleed easily.  Psychiatric/Behavioral: Positive for confusion and decreased concentration. The patient is not nervous/anxious.     Objective:  BP (!) 118/58 (BP Location: Left Arm, Patient Position: Sitting, Cuff Size: Large)   Pulse 74   Temp 97.7 F (36.5 C) (Oral)   Resp 16   Ht 5\' 9"  (1.753 m)   SpO2 96%   BMI 30.28 kg/m   BP Readings from Last 3 Encounters:  11/06/20 120/62  11/05/20 (!) 118/58  10/17/20 122/68    Wt Readings from Last 3 Encounters:  09/20/20 205 lb 0.4 oz (93 kg)  08/21/20 212 lb 12.8 oz (96.5 kg)  08/19/20 211 lb 9.6 oz (96 kg)    Physical Exam Vitals reviewed.  Constitutional:      General: He is sleeping.     Appearance: He is ill-appearing.  HENT:     Mouth/Throat:     Mouth: Mucous membranes are moist. Mucous membranes are pale.  Eyes:     General: No scleral icterus.    Conjunctiva/sclera: Conjunctivae normal.  Cardiovascular:     Rate and Rhythm: Normal rate and regular rhythm.     Heart sounds: S1 normal and S2 normal. Heart sounds are distant. Murmur heard.   Systolic murmur is present with a grade of 1/6. No friction rub. No gallop.   Pulmonary:     Effort: Pulmonary effort is normal.     Breath sounds: No wheezing, rhonchi or rales.  Abdominal:     General: Abdomen is flat.     Palpations: There is no mass.     Tenderness: There is no abdominal tenderness. There is no guarding.     Hernia: No hernia is present.  Musculoskeletal:        General: Normal range of motion.     Cervical back: Neck supple.     Right lower leg: No edema.     Left lower leg: No edema.  Lymphadenopathy:     Cervical: No cervical adenopathy.  Skin:    General: Skin is warm.     Coloration: Skin is pale. Skin is not jaundiced.     Findings: No bruising, erythema or rash.  Neurological:     Mental  Status: Mental status is at baseline. He is lethargic.     Lab Results  Component Value Date   WBC 5.2 11/05/2020   HGB 10.4 (L) 11/05/2020   HCT 33.0 (L) 11/05/2020   PLT 147 (L) 11/05/2020   GLUCOSE 138 (H) 11/05/2020   CHOL 107 09/10/2020   TRIG 77 09/13/2020   HDL 36 (L) 09/10/2020   LDLCALC 29 09/10/2020   ALT 78 (H) 09/19/2020   AST  52 (H) 09/19/2020   NA 138 11/05/2020   K 4.1 11/05/2020   CL 102 11/05/2020   CREATININE 1.74 (H) 11/05/2020   BUN 42 (H) 11/05/2020   CO2 28 11/05/2020   TSH 1.479 05/14/2020   PSA 1.08 07/10/2015   INR 1.0 09/09/2020   HGBA1C 7.8 (H) 09/10/2020   MICROALBUR 67.2 04/23/2020    No results found.  Assessment & Plan:   Frank [name] was seen today for cough and anemia.  Diagnoses and all orders for this visit:  Vitamin B12 deficiency anemia due to intrinsic factor deficiency- Improvement noted. -     Folate; Future -     CBC with Differential/Platelet; Future -     CBC with Differential/Platelet -     Folate  Essential hypertension- His BP is over-controlled. This may be contributing to his sxs. Will d'c the alpha-blocker and ARB -     Basic metabolic panel; Future -     Basic metabolic panel  Type 2 diabetes mellitus with peripheral neuropathy ([place])- His Blood sugar is well controlled. -     Basic metabolic panel; Future -     Basic metabolic panel  Stage 3b chronic kidney disease ([place])- Will d'c the ARB. -     Basic metabolic panel; Future -     Basic metabolic panel  Deficiency anemia- H/H are stable. Iron level is low. -     Iron; Future -     Folate; Future -     Ferritin; Future -     CBC with Differential/Platelet; Future -     CBC with Differential/Platelet -     Ferritin -     Folate -     Iron  Cough- CXR is negative for mass/infiltrate -     DG Chest 2 View; Future  Iron deficiency anemia secondary to inadequate dietary iron intake -     Ferric Maltol (ACCRUFER) 30 MG CAPS; Take 1 capsule by mouth in the  morning and at bedtime.   I have discontinued Frank Russells Carbonyl Iron, telmisartan, doxazosin, and doxazosin. I am also having him start on ACCRUFeR. Additionally, I am having him maintain his triamcinolone cream, azelastine, Insulin Pen Needle, Toujeo Max SoloStar, Gvoke HypoPen 2-Pack, PRESCRIPTION MEDICATION, solifenacin, aspirin, cloNIDine, amLODipine, atorvastatin, ticagrelor, levETIRAcetam, loratadine, FLUoxetine, glipiZIDE, pantoprazole, and levETIRAcetam. We will stop administering cyanocobalamin and cyanocobalamin.  Meds ordered this encounter  Medications  . Ferric Maltol (ACCRUFER) 30 MG CAPS    Sig: Take 1 capsule by mouth in the morning and at bedtime.    Dispense:  180 capsule    Refill:  1   I spent 50 minutes in preparing to see the patient by review of recent labs and imaging, obtaining and reviewing separately obtained history, communicating with the patient and family or caregiver, ordering medications and tests, documenting clinical information in the EHR including the differential Dx, treatment, and any further evaluation and other management of 1. Vitamin B12 deficiency anemia due to intrinsic factor deficiency 2. Essential hypertension 3. Type 2 diabetes mellitus with peripheral neuropathy ([name]) 4. Stage 3b chronic kidney disease ([place]) 5. Deficiency anemia 6. Cough 7. Iron deficiency anemia secondary to inadequate dietary iron intake     Follow-up: Return in about 3 weeks (around 11/26/2020).  Scarlette Calico, MD

## 2020-11-05 NOTE — ED Notes (Signed)
Frank [name], daughter [phone] would like an update when  possible

## 2020-11-05 NOTE — Patient Instructions (Signed)

## 2020-11-05 NOTE — Telephone Encounter (Signed)
Pt has been scheduled for today at 11.20am.  Ok'd by PCP.

## 2020-11-05 NOTE — ED Triage Notes (Signed)
Emergency Medicine Provider Triage Evaluation Note  Frank [name] , a 80 y.o. male  was evaluated in triage.  Pt complains of weakness.  Review of Systems  Positive: Weakness, sleepy Negative: Fever, headache, focal numbness, cp, sob, abd pain  Physical Exam  BP (!) 143/71 (BP Location: Left Arm)   Pulse 73   Temp 99 F (37.2 C) (Oral)   Resp 16   SpO2 99%  Gen:   Awake, no distress   HEENT:  Atraumatic  Resp:  Normal effort  Cardiac:  Normal rate  Abd:   Nondistended, nontender  MSK:   Moves extremities without difficulty  Neuro:  Speech clear   Medical Decision Making  Medically screening exam initiated at 6:28 PM.  Appropriate orders placed.  Frank [name] was informed that the remainder of the evaluation will be completed by another provider, this initial triage assessment does not replace that evaluation, and the importance of remaining in the ED until their evaluation is complete.  Clinical Impression  Generalized weakness and increased sleepiness for the past 3 days.  Chronic cough not worse than before.  No active chest pain abdominal pain no nausea vomiting diarrhea.  Possible anemia or  Medication induced.    Domenic Moras, PA-C 11/05/20 1830

## 2020-11-06 ENCOUNTER — Encounter (HOSPITAL_COMMUNITY): Payer: Self-pay | Admitting: Student

## 2020-11-06 DIAGNOSIS — D508 Other iron deficiency anemias: Secondary | ICD-10-CM | POA: Insufficient documentation

## 2020-11-06 LAB — URINALYSIS, ROUTINE W REFLEX MICROSCOPIC
Bilirubin Urine: NEGATIVE
Glucose, UA: NEGATIVE mg/dL
Ketones, ur: NEGATIVE mg/dL
Leukocytes,Ua: NEGATIVE
Nitrite: NEGATIVE
Protein, ur: 30 mg/dL — AB
Specific Gravity, Urine: 1.018 (ref 1.005–1.030)
pH: 5 (ref 5.0–8.0)

## 2020-11-06 LAB — SARS CORONAVIRUS 2 (TAT 6-24 HRS): SARS Coronavirus 2: NEGATIVE

## 2020-11-06 MED ORDER — ACCRUFER 30 MG PO CAPS: 1.0000 | ORAL_CAPSULE | Freq: Two times a day (BID) | ORAL | 1 refills | Status: DC

## 2020-11-06 MED ORDER — BENZONATATE 100 MG PO CAPS: 100.0000 mg | ORAL_CAPSULE | Freq: Three times a day (TID) | ORAL | 0 refills | Status: DC

## 2020-11-06 MED ORDER — SODIUM CHLORIDE 0.9 % IV BOLUS
500.0000 mL | Freq: Once | INTRAVENOUS | Status: AC
Start: 2020-11-06 — End: 2020-11-06
  Administered 2020-11-06: 500 mL via INTRAVENOUS

## 2020-11-06 NOTE — ED Provider Notes (Signed)
[facility] EMERGENCY DEPARTMENT Provider Note   CSN: [id number] Arrival date & time: 11/05/20  1748     History Chief Complaint  Patient presents with  . Weakness    Frank [name] is a 80 y.o. male with a hx of anemia, DM, hypertension, hyperlipidemia, OSA, prior stroke, & depression who presents to the ED with complaints of generalized weakness for the past 3 days. Patient reports he feels generally weak/fatigued and more tired than usual. He states this may be due to not sleeping well the past few nights with increased urinary frequency and he is not a good napper during the day. He has also had some post nasal drip which is fairly typical for him and a mild cough which he suspect is secondary to this. No alleviating/aggravating factors to his sxs. He saw his PCP today for follow up after seeing his neurointerventionalist Dr. Estanislado Pandy with concern for low BP recently. Today in PCP clinic he had blood work drawn and states he was given the option to go to the hospital, home, or hospice, he ultimately went home, but was concerned at the potential for hospice. Family called PCP and ultimately the decision was made to bring him to the ER. Some of his BP meds were discontinued at his appointment today. He has had some mildly decreased PO intake as well. He denies fever, chills, chest pain, dyspnea, nausea, vomiting, diarrhea, abdominal pain, syncope, dizziness, leg pain/swelling, or dysuria.   HPI     Past Medical History:  Diagnosis Date  . Allergy    Rhinitis  . Anemia    NOS iron deficient and B12 deficient  . Arthritis   . 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 Diabetes mellitus    Type 2  . GERD (gastroesophageal reflux disease)   . Hyperlipidemia   . Hypertension   . 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 OSA (obstructive sleep apnea)    cpap  . TBI (traumatic brain injury) [facility])     Patient Active Problem List   Diagnosis Date Noted  . Deficiency  anemia 11/05/2020  . DNR (do not resuscitate) discussion 10/17/2020  . Stroke (cerebrum) ([place]) 09/09/2020  . 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 Chronic renal disease, stage 3, moderately decreased glomerular filtration rate (GFR) between 30-59 mL/min/1.73 square meter ([place]) 11/21/2018  . GERD without esophagitis 07/22/2017  . Depression   . Type 2 diabetes mellitus with peripheral neuropathy ([name])   . Cough 10/31/2015  . OAB (overactive bladder) 11/26/2014  . Routine general medical examination at a health care facility 04/06/2014  . Vitamin D deficiency 07/23/2010  . Obstructive sleep apnea 09/13/2008  . Hyperlipidemia with target LDL less than 100 07/27/2007  . BPH (benign prostatic hyperplasia) 06/21/2007  . 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 B12 deficiency anemia 04/15/2006  . Essential hypertension 04/15/2006  . Allergic rhinitis 04/15/2006    Past Surgical History:  Procedure Laterality Date  . APPENDECTOMY    . arm fracture Left 2011   with hardware  . BURR HOLE Bilateral 08/04/2017   Procedure: Haskell Flirt;  Surgeon: [name], MD;  Location: [place];  Service: Neurosurgery;  Laterality: Bilateral;  . COLONOSCOPY    . CRANIOTOMY Left 12/01/2017   Procedure: Trudee Kuster Holes CRANIOTOMY HEMATOMA EVACUATION SUBDURAL;  Surgeon: [name], MD;  Location: [place];  Service: Neurosurgery;  Laterality: Left;  . ESOPHAGOGASTRODUODENOSCOPY  2006   gastritis  . IR PERCUTANEOUS ART THROMBECTOMY/INFUSION INTRACRANIAL INC DIAG ANGIO  09/09/2020  . PACEMAKER IMPLANT N/A 05/14/2020   Procedure: PACEMAKER IMPLANT;  Surgeon: [name], MD;  Location: [place] [facility];  Service: Cardiovascular;  Laterality: N/A;  . RADIOLOGY WITH ANESTHESIA N/A 09/09/2020   Procedure: IR WITH ANESTHESIA;  Surgeon: Radiologist, Medication, MD;  Location: [place];  Service: Radiology;  Laterality: N/A;  . ROTATOR CUFF REPAIR    . SEPTOPLASTY  1959   Deviated Septum  . 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 Heart disease Mother   . Breast cancer Mother   . Stroke Father   . Allergies Father        Father and children  . Coronary artery disease Brother   . Diabetes Neg Hx   . Colon cancer Neg Hx     Social History   Tobacco Use  . Smoking status: Former Smoker    Packs/day: 1.00    Quit date: 07/14/1983    Years since quitting: 37.3  . Smokeless tobacco: Never Used  Vaping Use  . Vaping Use: Never used  Substance Use Topics  . Alcohol use: Yes    Alcohol/week: 2.0 standard drinks    Types: 2 Standard drinks or equivalent per week    Comment: occassionally  . Drug use: No    Home Medications Prior to Admission medications   Medication Sig Start Date End Date Taking? Authorizing Provider  amLODipine (NORVASC) 10 MG tablet Take 1 tablet (10 mg total) by mouth daily. 09/18/20   Briant Cedar, MD  aspirin 81 MG chewable tablet Chew 1 tablet (81 mg total) by mouth daily. 09/18/20   Briant Cedar, MD  atorvastatin (LIPITOR) 10 MG tablet Take 1 tablet (10 mg total) by mouth daily. 09/18/20   Briant Cedar, MD  atorvastatin (LIPITOR) 20 MG tablet Take by mouth. 09/25/20   [provider]  azelastine (ASTELIN) 0.1 % nasal spray Place 1 spray into both nostrils 2 (two) times daily. Use in each nostril as directed 11/27/19   Janith Lima, MD  Carbonyl Iron 25 MG TABS Take 25 mg by mouth daily with breakfast.    [provider]  cloNIDine (CATAPRES - DOSED IN MG/24 HR) 0.2 mg/24hr patch Place 1 patch (0.2 mg total) onto the skin once a week. 09/18/20   Briant Cedar, MD  FLUoxetine (PROZAC) 20 MG capsule TAKE 1 CAPSULE BY MOUTH  DAILY 09/25/20   Janith Lima, MD  glipiZIDE (GLUCOTROL XL) 10 MG 24 hr tablet TAKE 1 TABLET BY MOUTH  DAILY 09/25/20    Janith Lima, MD  Glucagon (GVOKE HYPOPEN 2-PACK) 1 MG/0.2ML SOAJ Inject 1 Act into the skin daily as needed. Patient taking differently: Inject 1 each into the skin as needed (low blood sugar). 04/30/20   Janith Lima, MD  insulin glargine, 2 Unit Dial, (TOUJEO MAX SOLOSTAR) 300 UNIT/ML Solostar Pen Inject 10 Units into the skin daily. 04/16/20   Janith Lima, MD  Insulin Pen Needle 32G X 6 MM MISC 1 Act by Does not apply route daily. 04/08/20   Janith Lima, MD  levETIRAcetam (KEPPRA) 100 MG/ML solution Take 5 mLs (500 mg total) by mouth 2 (two) times daily. 09/18/20   Briant Cedar, MD  levETIRAcetam (KEPPRA) 500  MG tablet Take 500 mg by mouth 2 (two) times daily. 10/16/20   [provider]  loratadine (CLARITIN) 10 MG tablet Take 1 tablet (10 mg total) by mouth every evening. 09/18/20   Briant Cedar, MD  pantoprazole (PROTONIX) 40 MG tablet TAKE 1 TABLET BY MOUTH  DAILY 10/03/20   Janith Lima, MD  PRESCRIPTION MEDICATION CPAP- At bedtime    [provider]  solifenacin (VESICARE) 10 MG tablet TAKE 1 TABLET BY MOUTH  DAILY 08/09/20   Janith Lima, MD  ticagrelor (BRILINTA) 90 MG TABS tablet Take 0.5 tablets (45 mg total) by mouth 2 (two) times daily. 09/18/20   Briant Cedar, MD  triamcinolone cream (KENALOG) 0.1 % Apply 1 application topically daily as needed (to affected areas).  07/26/19   [provider]    Allergies    Lisinopril, Invokana [canagliflozin], Penicillins, and Sulfamethoxazole  Review of Systems   Review of Systems  Constitutional: Positive for fatigue. Negative for chills and fever.  HENT: Positive for postnasal drip. Negative for ear pain.   Respiratory: Positive for cough. Negative for shortness of breath.   Cardiovascular: Negative for chest pain and leg swelling.  Gastrointestinal: Negative for abdominal pain, diarrhea, nausea and vomiting.  Genitourinary: Positive for frequency. Negative for dysuria.   Neurological: Positive for weakness (generalized). Negative for syncope.  All other systems reviewed and are negative.   Physical Exam Updated Vital Signs BP (!) 123/57 (BP Location: Left Arm)   Pulse 74   Temp 99 F (37.2 C) (Oral)   Resp 18   SpO2 93%   Physical Exam Vitals and nursing note reviewed.  Constitutional:      General: He is not in acute distress.    Appearance: He is well-developed. He is not toxic-appearing.  HENT:     Head: Normocephalic and atraumatic.  Eyes:     General:        Right eye: No discharge.        Left eye: No discharge.     Conjunctiva/sclera: Conjunctivae normal.  Cardiovascular:     Rate and Rhythm: Normal rate and regular rhythm.     Pulses:          Radial pulses are 2+ on the right side and 2+ on the left side.  Pulmonary:     Effort: Pulmonary effort is normal. No respiratory distress.     Breath sounds: Normal breath sounds. No wheezing, rhonchi or rales.  Abdominal:     General: There is no distension.     Palpations: Abdomen is soft.     Tenderness: There is no abdominal tenderness. There is no guarding or rebound.  Musculoskeletal:     Cervical back: Neck supple.  Skin:    General: Skin is warm and dry.     Findings: No rash.  Neurological:     Mental Status: He is alert.     Comments: Clear speech.  Psychiatric:        Behavior: Behavior normal.     ED Results / Procedures / Treatments   Labs (all labs ordered are listed, but only abnormal results are displayed) Labs Reviewed  BASIC METABOLIC PANEL - Abnormal; Notable for the following components:      Result Value   Glucose, Bld 138 (*)    BUN 42 (*)    Creatinine, Ser 1.74 (*)    GFR, Estimated 39 (*)    All other components within normal limits  CBC - Abnormal;  Notable for the following components:   RBC 3.79 (*)    Hemoglobin 10.4 (*)    HCT 33.0 (*)    Platelets 147 (*)    All other components within normal limits  URINALYSIS, ROUTINE W REFLEX  MICROSCOPIC  CBG MONITORING, ED    EKG EKG Interpretation  Date/Time:  Tuesday November 05 2020 17:57:41 EDT Ventricular Rate:  70 PR Interval:  286 QRS Duration: 114 QT Interval:  422 QTC Calculation: 455 R Axis:   -57 Text Interpretation: Atrial-sensed ventricular-paced rhythm with prolonged AV conduction Abnormal ECG Confirmed by Thayer Jew [id number]) on 11/06/2020 12:10:40 AM   Radiology DG Chest 2 View  Result Date: 11/05/2020 CLINICAL DATA:  Cough and fatigue EXAM: CHEST - 2 VIEW COMPARISON:  September 18, 2020 FINDINGS: There is mild bibasilar scarring. No edema or airspace opacity. Heart size and pulmonary vascular normal. 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 No adenopathy. There is aortic atherosclerosis. Postoperative change noted in the proximal left humerus. Evidence of prior trauma lateral right clavicle. IMPRESSION: Slight bibasilar scarring. No edema or airspace opacity. Stable cardiac silhouette. 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 Aortic Atherosclerosis (ICD10-I70.0). Electronically Signed   By: Lowella Grip III M.D.   On: 11/05/2020 12:11    Procedures Procedures   Medications Ordered in ED Medications  sodium chloride 0.9 % bolus 500 mL (0 mLs Intravenous Stopped 11/06/20 0229)    ED Course  I have reviewed the triage vital signs and the nursing notes.  Pertinent labs & imaging results that were available during my care of the patient were reviewed by me and considered in my medical decision making (see chart for details).    MDM Rules/Calculators/A&P                         Patient presents to the ED with complaints of weakness/fatigue.  Patient is nontoxic, resting comfortably, initial vitals without significant abnormality.  Additional history obtained:  Additional history obtained from chart review & nursing note review.  Echo 09/09/20: EF 65-70%, LVH, grade 1 diastolic dysfunction.  01:35: I  discussed with patient's daughter Evalina Field, discussed that patient and his family had concerns about his health based on some of the statements made by PCP office, just wanted to ensure he is okay. He lives at home with his wife, they do have a home health nurse, he also goes to a gym for physical therapy as well.  EKG: Atrial-sensed ventricular-paced rhythm with prolonged AV conduction  Lab Tests:  I reviewed and interpreted labs, which included:  CBC: Mild anemia with hemoglobin of 10.4 hematocrit of 33.0, patient typically has hemoglobin in the 9-10 range.  Thrombocytopenia similar to prior. BMP: Mild elevation in BUN/creatinine compared to prior, baseline creatinine appears to be 1.4-1.5.  Urinalysis: No obvious UTI, small hemoglobin present which has been there previously.  Mild proteinuria.  Imaging Studies ordered:  I ordered imaging studies which included CXR, I independently reviewed, formal radiology impression shows:  Slight bibasilar scarring. No edema or airspace opacity. Stable cardiac silhouette. 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 Aortic Atherosclerosis   ED Course:  Patient presenting with generalized weakness/fatigue, has had some poor sleep recently, urinary frequency, and also has had some cough with a postnasal drip.  His work-up in the emergency department today is overall reassuring.  I suspect that he is mildly dehydrated with his elevated BUN and creatinine, will give  small 500 cc fluid bolus, last EF 65 to 70%.  Hemoglobin and hematocrit appear at baseline, given baseline and no reports of dark/tarry/bloody stool, do not suspect acute GI bleed.  No significant electrolyte derangement present.  No obvious UTI.  Chest x-ray without focal infiltrate to suggest pneumonia.  Chest x-ray is also without findings of pulmonary edema.  He does not appear to be in respiratory distress.  Some of this SPO2 documentations are in the low 90s, this has been when  the patient is sleeping, he is typically on CPAP at night suspect this is related to his OSA.  When upright and awake sats are 95% or above.  Patient was able to get up and ambulate throughout the emergency department with his walker.  On reassessment patient is resting in no acute distress, he feels comfortable with plan to go home, requesting something for cough- will prescribe tessalon, will also obtain covid 19 test.  I called and rediscussed with his daughter, updated on results and plan of care, in agreement.   Overall reassuring evaluation in the ED tonight and patient appears appropriate for discharge with close PCP follow-up. I discussed results, treatment plan, need for follow-up, and return precautions with the patient. Provided opportunity for questions, patient confirmed understanding and is in agreement with plan.   Findings and plan of care discussed with supervising physician Dr. Dina Rich who is in agreement.   Portions of this note were generated with Lobbyist. Dictation errors may occur despite best attempts at proofreading.  Final Clinical Impression(s) / ED Diagnoses Final diagnoses:  Fatigue, unspecified type  Cough  Elevated serum creatinine    Rx / DC Orders ED Discharge Orders         Ordered    benzonatate (TESSALON) 100 MG capsule  Every 8 hours        11/06/20 0443           Coltan Spinello, Glynda Jaeger, PA-C 11/06/20 0445    Merryl Hacker, MD 11/06/20 [phone]

## 2020-11-06 NOTE — Telephone Encounter (Addendum)
Pt has been seen by PCP in interim. Telmisartan and doxazosin were discontinued and hospice was discussed. Pt went to ED and was deemed stable for discharge home.   Current Outpatient Medications:  .  amLODipine (NORVASC) 10 MG tablet, Take 1 tablet (10 mg total) by mouth daily., Disp: , Rfl:  .  aspirin 81 MG chewable tablet, Chew 1 tablet (81 mg total) by mouth daily., Disp: , Rfl:  .  atorvastatin (LIPITOR) 10 MG tablet, Take 1 tablet (10 mg total) by mouth daily., Disp: , Rfl:  .  azelastine (ASTELIN) 0.1 % nasal spray, Place 1 spray into both nostrils 2 (two) times daily. Use in each nostril as directed, Disp: 90 mL, Rfl: 1 .  benzonatate (TESSALON) 100 MG capsule, Take 1 capsule (100 mg total) by mouth every 8 (eight) hours., Disp: 21 capsule, Rfl: 0 .  Carbonyl Iron 25 MG TABS, Take 25 mg by mouth daily with breakfast., Disp: , Rfl:  .  cloNIDine (CATAPRES - DOSED IN MG/24 HR) 0.2 mg/24hr patch, Place 1 patch (0.2 mg total) onto the skin once a week., Disp: , Rfl:  .  FLUoxetine (PROZAC) 20 MG capsule, TAKE 1 CAPSULE BY MOUTH  DAILY, Disp: 90 capsule, Rfl: 1 .  glipiZIDE (GLUCOTROL XL) 10 MG 24 hr tablet, TAKE 1 TABLET BY MOUTH  DAILY, Disp: 90 tablet, Rfl: 0 .  Glucagon (GVOKE HYPOPEN 2-PACK) 1 MG/0.2ML SOAJ, Inject 1 Act into the skin daily as needed. (Patient taking differently: Inject 1 each into the skin as needed (low blood sugar).), Disp: 2 mL, Rfl: 5 .  insulin glargine, 2 Unit Dial, (TOUJEO MAX SOLOSTAR) 300 UNIT/ML Solostar Pen, Inject 10 Units into the skin daily., Disp: 6 mL, Rfl: 1 .  Insulin Pen Needle 32G X 6 MM MISC, 1 Act by Does not apply route daily., Disp: 100 each, Rfl: 1 .  levETIRAcetam (KEPPRA) 100 MG/ML solution, Take 5 mLs (500 mg total) by mouth 2 (two) times daily., Disp: 473 mL, Rfl: 12 .  levETIRAcetam (KEPPRA) 500 MG tablet, Take 500 mg by mouth 2 (two) times daily., Disp: , Rfl:  .  loratadine (CLARITIN) 10 MG tablet, Take 1 tablet (10 mg total) by mouth every  evening., Disp: , Rfl:  .  pantoprazole (PROTONIX) 40 MG tablet, TAKE 1 TABLET BY MOUTH  DAILY, Disp: 90 tablet, Rfl: 1 .  PRESCRIPTION MEDICATION, CPAP- At bedtime, Disp: , Rfl:  .  solifenacin (VESICARE) 10 MG tablet, TAKE 1 TABLET BY MOUTH  DAILY, Disp: 90 tablet, Rfl: 1 .  ticagrelor (BRILINTA) 90 MG TABS tablet, Take 0.5 tablets (45 mg total) by mouth 2 (two) times daily., Disp: 60 tablet, Rfl:  .  triamcinolone cream (KENALOG) 0.1 %, Apply 1 application topically daily as needed (to affected areas). , Disp: , Rfl:   Current Facility-Administered Medications:  .  cyanocobalamin ((VITAMIN B-12)) injection 1,000 mcg, 1,000 mcg, Intramuscular, Q30 days, Janith Lima, MD, 1,000 mcg at 10/17/20 1400 .  cyanocobalamin ((VITAMIN B-12)) injection 1,000 mcg, 1,000 mcg, Intramuscular, Once, Biagio Borg, MD

## 2020-11-06 NOTE — ED Notes (Signed)
Lab to add on urine culture

## 2020-11-06 NOTE — Addendum Note (Signed)
Addended by: Charlton Haws on: 11/06/2020 01:47 PM   Modules accepted: Orders

## 2020-11-06 NOTE — Discharge Instructions (Addendum)
You were seen in the emergency department tonight for generalized weakness, cough, and increased urination.  Your labs show that your kidney function was mildly elevated (BUN and creatinine), please be sure to stay well-hydrated and have this rechecked by your primary care provider within the next 3 days.  Your hemoglobin and hematocrit show that you are mildly anemic but are similar to prior labs you have had done.  You have a small amount of blood and protein in your urine, please have this rechecked by your primary care doctor as well.   We have tested you for COVID-19, we will call you if this test result is positive.  If positive you will need to follow current CDC isolation guidelines.  We will also call you if your urine culture is abnormal.  We are sending home with Tessalon to take every 8 hours as needed for coughing.  We have prescribed you new medication(s) today. Discuss the medications prescribed today with your pharmacist as they can have adverse effects and interactions with your other medicines including over the counter and prescribed medications. Seek medical evaluation if you start to experience new or abnormal symptoms after taking one of these medicines, seek care immediately if you start to experience difficulty breathing, feeling of your throat closing, facial swelling, or rash as these could be indications of a more serious allergic reaction  Please call your primary care provider today to schedule soonest possible follow-up.  Return to the ER for any new or worsening symptoms including but not limited to increased weakness, new areas of pain, chest pain, trouble breathing, passing out, coughing up blood, fever, abdominal pain, inability to keep fluids down, numbness or weakness to a certain side of your body, trouble with your speech, or any other concerns that you may have.

## 2020-11-06 NOTE — ED Notes (Signed)
Pt states understanding that a urine sample is needed. Urinal placed at bedside.

## 2020-11-06 NOTE — ED Notes (Signed)
Attempted to contact pts daughter for update. No answer.

## 2020-11-06 NOTE — Telephone Encounter (Signed)
Pt's son Frank [name] has been communicating via MyChart with PCP in regard to this question.

## 2020-11-08 ENCOUNTER — Telehealth: Payer: Self-pay | Admitting: Internal Medicine

## 2020-11-08 LAB — URINE CULTURE: Culture: 50000 — AB

## 2020-11-08 NOTE — Telephone Encounter (Signed)
Yes, I am ok with this pls cancel 5/4 with me and get him in to see Dr. Mitchel Honour next week for an ER f//up

## 2020-11-08 NOTE — Telephone Encounter (Signed)
Patient wife is wondering if he can switch from Dr. Ronnald Ramp to Dr. Mitchel Honour. Let me know, thanks.

## 2020-11-09 NOTE — Telephone Encounter (Signed)
Not sure why patient would want to stop long-term relationship with his established primary care physician who knows him and all the details of his chronic medical conditions very well. I strongly recommend against switching.  Patient and his wife should reconsider. Thanks.

## 2020-11-09 NOTE — Telephone Encounter (Signed)
NO treatment for [facility] ED 11/06/20 Per Pharm D  Chinita Pester

## 2020-11-11 NOTE — Telephone Encounter (Signed)
Spoke to patients wife, Dr. Ronnald Ramp had sent a message to patients son and told him that the patient needed to go to ED because he was going to die. And when the patient came to see Dr. Ronnald Ramp patient wife states that Dr. Ronnald Ramp just stuck his head in the door and asked if they wanted to put the patient on hospice, go to the hospital, or send him home and didn't explain to them what was going on with the patient.  Patient would like to get in with another provider as soon as possible. Let me know, thank you.

## 2020-11-11 NOTE — Telephone Encounter (Signed)
I prefer not to take over this case. So it's a negative on my end. Thanks.

## 2020-11-12 NOTE — Telephone Encounter (Signed)
LVM to discuss with patients wife.

## 2020-11-12 NOTE — Telephone Encounter (Signed)
Patients wife called back, decided to stay with Dr. Ronnald Ramp

## 2020-11-13 ENCOUNTER — Ambulatory Visit (INDEPENDENT_AMBULATORY_CARE_PROVIDER_SITE_OTHER): Payer: Medicare Other | Admitting: Internal Medicine

## 2020-11-13 ENCOUNTER — Other Ambulatory Visit: Payer: Self-pay

## 2020-11-13 ENCOUNTER — Encounter: Payer: Self-pay | Admitting: Internal Medicine

## 2020-11-13 ENCOUNTER — Ambulatory Visit (INDEPENDENT_AMBULATORY_CARE_PROVIDER_SITE_OTHER): Payer: Medicare Other

## 2020-11-13 VITALS — BP 116/62 | HR 75 | Temp 98.4°F | Ht 69.0 in | Wt 204.0 lb

## 2020-11-13 DIAGNOSIS — J410 Simple chronic bronchitis: Secondary | ICD-10-CM | POA: Diagnosis not present

## 2020-11-13 DIAGNOSIS — I10 Essential (primary) hypertension: Secondary | ICD-10-CM | POA: Diagnosis not present

## 2020-11-13 DIAGNOSIS — D510 Vitamin B12 deficiency anemia due to intrinsic factor deficiency: Secondary | ICD-10-CM | POA: Diagnosis not present

## 2020-11-13 DIAGNOSIS — D508 Other iron deficiency anemias: Secondary | ICD-10-CM | POA: Diagnosis not present

## 2020-11-13 DIAGNOSIS — I441 Atrioventricular block, second degree: Secondary | ICD-10-CM

## 2020-11-13 MED ORDER — HYDROCODONE BIT-HOMATROP MBR 5-1.5 MG/5ML PO SOLN: 5.0000 mL | Freq: Three times a day (TID) | ORAL | 0 refills | Status: DC | PRN

## 2020-11-13 MED ORDER — CLONIDINE 0.1 MG/24HR TD PTWK: 0.1000 mg | MEDICATED_PATCH | TRANSDERMAL | 1 refills | Status: DC

## 2020-11-13 MED ORDER — CYANOCOBALAMIN 1000 MCG/ML IJ SOLN
1000.0000 ug | Freq: Once | INTRAMUSCULAR | Status: AC
  Administered 2020-11-13: 1000 ug via INTRAMUSCULAR

## 2020-11-13 NOTE — Progress Notes (Addendum)
Subjective:  Patient ID: Frank [name], male    DOB: 12/10/1940  Age: 80 y.o. MRN: [id number]  CC: Cough, Anemia, and Hypertension  This visit occurred during the SARS-CoV-2 public health emergency.  Safety protocols were in place, including screening questions prior to the visit, additional usage of staff PPE, and extensive cleaning of exam room while observing appropriate contact time as indicated for disinfecting solutions.    HPI Frank [name] presents for f/up -   I saw him a week ago for a myriad of complaints.  His family took him to the ED.  He did not get an iron infusion.  He has not started taking the iron supplement yet.  Tessalon Perles were prescribed for cough which has not helped.  The cough is keeping him awake and interfering with his daily activities.  I had recommended that the family enlist hospice or palliative care but they want to continue receiving home health care.  The physical therapist stated he has worsening left-sided weakness.  Outpatient Medications Prior to Visit  Medication Sig Dispense Refill  . amLODipine (NORVASC) 10 MG tablet Take 1 tablet (10 mg total) by mouth daily.    [name] aspirin 81 MG chewable tablet Chew 1 tablet (81 mg total) by mouth daily.    [name] atorvastatin (LIPITOR) 10 MG tablet Take 1 tablet (10 mg total) by mouth daily.    [name] azelastine (ASTELIN) 0.1 % nasal spray Place 1 spray into both nostrils 2 (two) times daily. Use in each nostril as directed 90 mL 1  . Ferric Maltol (ACCRUFER) 30 MG CAPS Take 1 capsule by mouth in the morning and at bedtime. 180 capsule 1  . FLUoxetine (PROZAC) 20 MG capsule TAKE 1 CAPSULE BY MOUTH  DAILY 90 capsule 1  . glipiZIDE (GLUCOTROL XL) 10 MG 24 hr tablet TAKE 1 TABLET BY MOUTH  DAILY 90 tablet 0  . Glucagon (GVOKE HYPOPEN 2-PACK) 1 MG/0.2ML SOAJ Inject 1 Act into the skin daily as needed. (Patient taking differently: Inject 1 each into the skin as needed (low blood sugar).) 2 mL 5  . insulin  glargine, 2 Unit Dial, (TOUJEO MAX SOLOSTAR) 300 UNIT/ML Solostar Pen Inject 10 Units into the skin daily. 6 mL 1  . Insulin Pen Needle 32G X 6 MM MISC 1 Act by Does not apply route daily. 100 each 1  . levETIRAcetam (KEPPRA) 100 MG/ML solution Take 5 mLs (500 mg total) by mouth 2 (two) times daily. 473 mL 12  . levETIRAcetam (KEPPRA) 500 MG tablet Take 500 mg by mouth 2 (two) times daily.    [name] loratadine (CLARITIN) 10 MG tablet Take 1 tablet (10 mg total) by mouth every evening.    . pantoprazole (PROTONIX) 40 MG tablet TAKE 1 TABLET BY MOUTH  DAILY 90 tablet 1  . PRESCRIPTION MEDICATION CPAP- At bedtime    . solifenacin (VESICARE) 10 MG tablet TAKE 1 TABLET BY MOUTH  DAILY 90 tablet 1  . ticagrelor (BRILINTA) 90 MG TABS tablet Take 0.5 tablets (45 mg total) by mouth 2 (two) times daily. 60 tablet   . triamcinolone cream (KENALOG) 0.1 % Apply 1 application topically daily as needed (to affected areas).     . benzonatate (TESSALON) 100 MG capsule Take 1 capsule (100 mg total) by mouth every 8 (eight) hours. 21 capsule 0  . cloNIDine (CATAPRES - DOSED IN MG/24 HR) 0.2 mg/24hr patch Place 1 patch (0.2 mg total) onto the skin once a week.  No facility-administered medications prior to visit.    ROS Review of Systems  Constitutional: Positive for fatigue. Negative for diaphoresis and unexpected weight change.  HENT: Negative.   Eyes: Negative.   Respiratory: Positive for cough. Negative for shortness of breath and wheezing.   Cardiovascular: Negative for chest pain, palpitations and leg swelling.  Gastrointestinal: Negative for abdominal pain, diarrhea and nausea.  Endocrine: Negative.   Genitourinary: Negative.  Negative for difficulty urinating.  Musculoskeletal: Negative.   Skin: Negative.   Neurological: Positive for dizziness, weakness and light-headedness. Negative for headaches.  Hematological: Negative for adenopathy. Does not bruise/bleed easily.  Psychiatric/Behavioral:  Negative.     Objective:  BP 116/62 (BP Location: Left Arm, Patient Position: Sitting, Cuff Size: Large)   Pulse 75   Temp 98.4 F (36.9 C) (Oral)   Ht _0  (1.753 m)   Wt 204 lb (92.5 kg)   SpO2 92%   BMI 30.13 kg/m   BP Readings from Last 3 Encounters:  11/13/20 116/62  11/06/20 120/62  11/05/20 (!) 118/58    Wt Readings from Last 3 Encounters:  11/13/20 204 lb (92.5 kg)  09/20/20 205 lb 0.4 oz (93 kg)  08/21/20 212 lb 12.8 oz (96.5 kg)    Physical Exam Vitals reviewed.  HENT:     Nose: Nose normal.     Mouth/Throat:     Mouth: Mucous membranes are moist.  Eyes:     General: No scleral icterus.    Conjunctiva/sclera: Conjunctivae normal.  Cardiovascular:     Rate and Rhythm: Normal rate and regular rhythm.     Heart sounds: No murmur heard.   Pulmonary:     Effort: Pulmonary effort is normal.     Breath sounds: Examination of the right-middle field reveals rhonchi. Examination of the left-middle field reveals rhonchi. Rhonchi present. No decreased breath sounds, wheezing or rales.  Abdominal:     General: Abdomen is flat.     Palpations: There is no mass.     Tenderness: There is no abdominal tenderness. There is no guarding.  Musculoskeletal:        General: Normal range of motion.     Cervical back: Neck supple.     Right lower leg: No edema.     Left lower leg: No edema.  Lymphadenopathy:     Cervical: No cervical adenopathy.  Skin:    General: Skin is warm and dry.  Neurological:     Mental Status: Mental status is at baseline. He is lethargic.     Lab Results  Component Value Date   WBC 5.2 11/05/2020   HGB 10.4 (L) 11/05/2020   HCT 33.0 (L) 11/05/2020   PLT 147 (L) 11/05/2020   GLUCOSE 138 (H) 11/05/2020   CHOL 107 09/10/2020   TRIG 77 09/13/2020   HDL 36 (L) 09/10/2020   LDLCALC 29 09/10/2020   ALT 78 (H) 09/19/2020   AST 52 (H) 09/19/2020   NA 138 11/05/2020   K 4.1 11/05/2020   CL 102 11/05/2020   CREATININE 1.74 (H) 11/05/2020    BUN 42 (H) 11/05/2020   CO2 28 11/05/2020   TSH 1.479 05/14/2020   PSA 1.08 07/10/2015   INR 1.0 09/09/2020   HGBA1C 7.8 (H) 09/10/2020   MICROALBUR 67.2 04/23/2020    DG Chest 2 View  Result Date: 11/05/2020 CLINICAL DATA:  Cough and fatigue EXAM: CHEST - 2 VIEW COMPARISON:  September 18, 2020 FINDINGS: There is mild bibasilar scarring. No edema or airspace opacity.  Heart size and pulmonary vascular normal. 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 No adenopathy. There is aortic atherosclerosis. Postoperative change noted in the proximal left humerus. Evidence of prior trauma lateral right clavicle. IMPRESSION: Slight bibasilar scarring. No edema or airspace opacity. Stable cardiac silhouette. 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 Aortic Atherosclerosis (ICD10-I70.0). Electronically Signed   By: Lowella Grip III M.D.   On: 11/05/2020 12:11    Assessment & Plan:   Djimon was seen today for cough, anemia and hypertension.  Diagnoses and all orders for this visit:  Essential hypertension- His blood pressure remains soft.  Will lower the dose of the clonidine patch. -     cloNIDine (CATAPRES - DOSED IN MG/24 HR) 0.1 mg/24hr patch; Place 1 patch (0.1 mg total) onto the skin once a week.  Iron deficiency anemia secondary to inadequate dietary iron intake- His wife agrees to purchase the oral iron supplement and to start giving it to him.  Simple chronic bronchitis ([place])- I recommend that he start taking hydrocodone and homatroprine for palliative purposes. -     HYDROcodone bit-homatropine (HYCODAN) 5-1.5 MG/5ML syrup; Take 5 mLs by mouth every 8 (eight) hours as needed for cough.  Vitamin B12 deficiency anemia due to intrinsic factor deficiency -     cyanocobalamin ((VITAMIN B-12)) injection 1,000 mcg   I have discontinued [name] [name] "ken"'s cloNIDine and benzonatate. I am also having him start on cloNIDine and HYDROcodone  bit-homatropine. Additionally, I am having him maintain his triamcinolone cream, azelastine, Insulin Pen Needle, Toujeo Max SoloStar, Gvoke HypoPen 2-Pack, PRESCRIPTION MEDICATION, solifenacin, aspirin, amLODipine, atorvastatin, ticagrelor, levETIRAcetam, loratadine, FLUoxetine, glipiZIDE, pantoprazole, levETIRAcetam, and ACCRUFeR. We administered cyanocobalamin.  Meds ordered this encounter  Medications  . cloNIDine (CATAPRES - DOSED IN MG/24 HR) 0.1 mg/24hr patch    Sig: Place 1 patch (0.1 mg total) onto the skin once a week.    Dispense:  12 patch    Refill:  1  . HYDROcodone bit-homatropine (HYCODAN) 5-1.5 MG/5ML syrup    Sig: Take 5 mLs by mouth every 8 (eight) hours as needed for cough.    Dispense:  120 mL    Refill:  0  . cyanocobalamin ((VITAMIN B-12)) injection 1,000 mcg     Follow-up: Return in about 3 months (around 02/13/2021).  Scarlette Calico, MD

## 2020-11-13 NOTE — Patient Instructions (Signed)
Iron Deficiency Anemia, Adult Iron deficiency anemia is a condition in which the concentration of red blood cells or hemoglobin in the blood is below normal because of too little iron. Hemoglobin is a substance in red blood cells that carries oxygen to the body's tissues. When the concentration of red blood cells or hemoglobin is too low, not enough oxygen reaches these tissues. Iron deficiency anemia is usually long-lasting, and it develops over time. It may or may not cause symptoms. It is a common type of anemia. What are the causes? This condition may be caused by: Not enough iron in the diet. Abnormal absorption in the gut. Increased need for iron because of pregnancy or heavy menstrual periods, for females. Cancers of the gastrointestinal system, such as colon cancer. Blood loss caused by bleeding in the intestine. This may be from a gastrointestinal condition like Crohn's disease. Frequent blood draws, such as from blood donation. What increases the risk? The following factors may make you more likely to develop this condition: Being pregnant. Being a teenage girl going through a growth spurt. What are the signs or symptoms? Symptoms of this condition may include: Pale skin, lips, and nail beds. Weakness, dizziness, and getting tired easily. Headache. Shortness of breath when moving or exercising. Cold hands and feet. Fast or irregular heartbeat. Irritability or rapid breathing. These are more common in severe anemia. Mild anemia may not cause any symptoms. How is this diagnosed? This condition is diagnosed based on: Your medical history. A physical exam. Blood tests. You may have additional tests to find the underlying cause of your anemia, such as: Testing for blood in the stool (fecal occult blood test). A procedure to see inside your colon and rectum (colonoscopy). A procedure to see inside your esophagus and stomach (endoscopy). A test in which cells are removed from  bone marrow (bone marrow aspiration) or fluid is removed from the bone marrow to be examined. This is rarely needed. How is this treated? This condition is treated by correcting the cause of your iron deficiency. Treatment may involve: Adding iron-rich foods to your diet. Taking iron supplements. If you are pregnant or breastfeeding, you may need to take extra iron because your normal diet usually does not provide the amount of iron that you need. Increasing vitamin C intake. Vitamin C helps your body absorb iron. Your health care provider may recommend that you take iron supplements along with a glass of orange juice or a vitamin C supplement. Medicines to make heavy menstrual flow lighter. Surgery. You may need repeat blood tests to determine whether treatment is working. If the treatment does not seem to be working, you may need more tests. Follow these instructions at home: Medicines Take over-the-counter and prescription medicines only as told by your health care provider. This includes iron supplements and vitamins. For the best iron absorption, you should take iron supplements when your stomach is empty. If you cannot tolerate them on an empty stomach, you may need to take them with food. Do not drink milk or take antacids at the same time as your iron supplements. Milk and antacids may interfere with iron absorption. Iron supplements may turn stool (feces) a darker color and it may appear black. If you cannot tolerate taking iron supplements by mouth, talk with your health care provider about taking them through an IV or through an injection into a muscle. Eating and drinking Talk with your health care provider before changing your diet. He or she may recommend that   you eat foods that contain a lot of iron, such as: Liver. Low-fat (lean) beef. Breads and cereals that have iron added to them (are fortified). Eggs. Dried fruit. Dark green, leafy vegetables. To help your body use the  iron from iron-rich foods, eat those foods at the same time as fresh fruits and vegetables that are high in vitamin C. Foods that are high in vitamin C include: Oranges. Peppers. Tomatoes. Mangoes. Drink enough fluid to keep your urine pale yellow.   Managing constipation If you are taking an iron supplement, it may cause constipation. To prevent or treat constipation, you may need to: Take over-the-counter or prescription medicines. Eat foods that are high in fiber, such as beans, whole grains, and fresh fruits and vegetables. Limit foods that are high in fat and processed sugars, such as fried or sweet foods. General instructions Return to your normal activities as told by your health care provider. Ask your health care provider what activities are safe for you. Practice good hygiene. Anemia can make you more prone to illness and infection. Keep all follow-up visits as told by your health care provider. This is important. Contact a health care provider if you: Feel nauseous or you vomit. Feel weak. Have unexplained sweating. Develop symptoms of constipation, such as: Having fewer than three bowel movements a week. Straining to have a bowel movement. Having stools that are hard, dry, or larger than normal. Feeling full or bloated. Pain in the lower abdomen. Not feeling relief after having a bowel movement. Get help right away if you: Faint. If this happens, do not drive yourself to the hospital. Have chest pain. Have shortness of breath that: Is severe. Gets worse with physical activity. Have an irregular or rapid heartbeat. Become light-headed when getting up from a sitting or lying down position. These symptoms may represent a serious problem that is an emergency. Do not wait to see if the symptoms will go away. Get medical help right away. Call your local emergency services (911 in the [place] Do not drive yourself to the hospital. Summary Iron deficiency anemia is a condition  in which the concentration of red blood cells or hemoglobin in the blood is below normal because of too little iron. This condition is treated by correcting the cause of your iron deficiency. Take over-the-counter and prescription medicines only as told by your health care provider. This includes iron supplements and vitamins. To help your body use the iron from iron-rich foods, eat those foods at the same time as fresh fruits and vegetables that are high in vitamin C. Get help right away if you have shortness of breath that gets worse with physical activity. This information is not intended to replace advice given to you by your health care provider. Make sure you discuss any questions you have with your health care provider. Document Revised: 03/07/2019 Document Reviewed: 03/07/2019 Elsevier Patient Education  2021 [employer] .  

## 2020-11-14 ENCOUNTER — Telehealth: Payer: Self-pay | Admitting: Internal Medicine

## 2020-11-14 ENCOUNTER — Telehealth: Payer: Self-pay

## 2020-11-14 ENCOUNTER — Other Ambulatory Visit: Payer: Self-pay | Admitting: Internal Medicine

## 2020-11-14 DIAGNOSIS — N3281 Overactive bladder: Secondary | ICD-10-CM

## 2020-11-14 DIAGNOSIS — R569 Unspecified convulsions: Secondary | ICD-10-CM

## 2020-11-14 DIAGNOSIS — I6601 Occlusion and stenosis of right middle cerebral artery: Secondary | ICD-10-CM

## 2020-11-14 LAB — CUP PACEART REMOTE DEVICE CHECK
Battery Remaining Longevity: 148 mo
Battery Voltage: 3.17 V
Brady Statistic AP VP Percent: 5.72 %
Brady Statistic AP VS Percent: 0 %
Brady Statistic AS VP Percent: 94.14 %
Brady Statistic AS VS Percent: 0.14 %
Brady Statistic RA Percent Paced: 5.75 %
Brady Statistic RV Percent Paced: 99.86 %
Date Time Interrogation Session: 20220503230039
Implantable Lead Implant Date: 20211102
Implantable Lead Implant Date: 20211102
Implantable Lead Location: 753859
Implantable Lead Location: 753860
Implantable Lead Model: 3830
Implantable Lead Model: 5076
Implantable Pulse Generator Implant Date: 20211102
Lead Channel Impedance Value: 342 Ohm
Lead Channel Impedance Value: 361 Ohm
Lead Channel Impedance Value: 456 Ohm
Lead Channel Impedance Value: 475 Ohm
Lead Channel Pacing Threshold Amplitude: 0.625 V
Lead Channel Pacing Threshold Amplitude: 1 V
Lead Channel Pacing Threshold Pulse Width: 0.4 ms
Lead Channel Pacing Threshold Pulse Width: 0.4 ms
Lead Channel Sensing Intrinsic Amplitude: 13.375 mV
Lead Channel Sensing Intrinsic Amplitude: 13.375 mV
Lead Channel Sensing Intrinsic Amplitude: 4.125 mV
Lead Channel Sensing Intrinsic Amplitude: 4.125 mV
Lead Channel Setting Pacing Amplitude: 1.5 V
Lead Channel Setting Pacing Amplitude: 2 V
Lead Channel Setting Pacing Pulse Width: 0.4 ms
Lead Channel Setting Sensing Sensitivity: 1.2 mV

## 2020-11-14 MED ORDER — TICAGRELOR 90 MG PO TABS: 45.0000 mg | ORAL_TABLET | Freq: Two times a day (BID) | ORAL | 0 refills | Status: DC

## 2020-11-14 MED ORDER — SOLIFENACIN SUCCINATE 10 MG PO TABS: 10.0000 mg | ORAL_TABLET | Freq: Every day | ORAL | 1 refills | Status: DC

## 2020-11-14 MED ORDER — LEVETIRACETAM 100 MG/ML PO SOLN: 500.0000 mg | Freq: Two times a day (BID) | ORAL | 5 refills | Status: DC

## 2020-11-14 NOTE — Telephone Encounter (Signed)
   Spouse calling to request refill for solifenacin (VESICARE) 10 MG tablet ticagrelor (BRILINTA) 90 MG TABS tablet levETIRAcetam (KEPPRA) 500 MG tablet   [place], [place]    Patient has no medication remaining

## 2020-11-14 NOTE — Telephone Encounter (Signed)
Attempted to contact patient's wife Linna Caprice to schedule a Palliative Care consult appointment. No answer left a message to return call. Sent Mychart message also.

## 2020-11-14 NOTE — Telephone Encounter (Signed)
Spoke with patient's wife Linna Caprice and scheduled an in-person Palliative Consult for 12/13/20 @ 9AM  COVID screening was negative. No pets in home. Patient lives with wife.   Consent obtained; updated Outlook/Netsmart/Team List and Epic.  Family is aware they may be receiving a call from NP the day before or day of to confirm appointment.

## 2020-11-18 ENCOUNTER — Inpatient Hospital Stay (HOSPITAL_COMMUNITY)
Admission: EM | Admit: 2020-11-18 | Discharge: 2020-11-25 | DRG: 689 | Disposition: A | Discharge status: Skilled Nursing Facility | Payer: Medicare Other | Attending: Internal Medicine | Admitting: Internal Medicine

## 2020-11-18 ENCOUNTER — Encounter (HOSPITAL_COMMUNITY): Payer: Self-pay | Admitting: *Deleted

## 2020-11-18 ENCOUNTER — Ambulatory Visit: Payer: Medicare Other

## 2020-11-18 ENCOUNTER — Emergency Department (HOSPITAL_COMMUNITY): Payer: Medicare Other

## 2020-11-18 ENCOUNTER — Inpatient Hospital Stay (HOSPITAL_COMMUNITY): Payer: Medicare Other

## 2020-11-18 ENCOUNTER — Other Ambulatory Visit: Payer: Self-pay

## 2020-11-18 DIAGNOSIS — E785 Hyperlipidemia, unspecified: Secondary | ICD-10-CM | POA: Diagnosis not present

## 2020-11-18 DIAGNOSIS — N390 Urinary tract infection, site not specified: Secondary | ICD-10-CM | POA: Diagnosis not present

## 2020-11-18 DIAGNOSIS — I639 Cerebral infarction, unspecified: Secondary | ICD-10-CM | POA: Diagnosis not present

## 2020-11-18 DIAGNOSIS — Z79899 Other long term (current) drug therapy: Secondary | ICD-10-CM

## 2020-11-18 DIAGNOSIS — I441 Atrioventricular block, second degree: Secondary | ICD-10-CM | POA: Diagnosis not present

## 2020-11-18 DIAGNOSIS — R531 Weakness: Secondary | ICD-10-CM | POA: Diagnosis not present

## 2020-11-18 DIAGNOSIS — B9620 Unspecified Escherichia coli [E. coli] as the cause of diseases classified elsewhere: Secondary | ICD-10-CM | POA: Diagnosis present

## 2020-11-18 DIAGNOSIS — Z23 Encounter for immunization: Secondary | ICD-10-CM | POA: Diagnosis not present

## 2020-11-18 DIAGNOSIS — D519 Vitamin B12 deficiency anemia, unspecified: Secondary | ICD-10-CM | POA: Diagnosis not present

## 2020-11-18 DIAGNOSIS — Z823 Family history of stroke: Secondary | ICD-10-CM

## 2020-11-18 DIAGNOSIS — D631 Anemia in chronic kidney disease: Secondary | ICD-10-CM | POA: Diagnosis not present

## 2020-11-18 DIAGNOSIS — R202 Paresthesia of skin: Secondary | ICD-10-CM | POA: Diagnosis not present

## 2020-11-18 DIAGNOSIS — F0150 Vascular dementia without behavioral disturbance: Secondary | ICD-10-CM | POA: Diagnosis present

## 2020-11-18 DIAGNOSIS — E1122 Type 2 diabetes mellitus with diabetic chronic kidney disease: Secondary | ICD-10-CM | POA: Diagnosis not present

## 2020-11-18 DIAGNOSIS — Z7189 Other specified counseling: Secondary | ICD-10-CM | POA: Diagnosis not present

## 2020-11-18 DIAGNOSIS — E669 Obesity, unspecified: Secondary | ICD-10-CM | POA: Diagnosis present

## 2020-11-18 DIAGNOSIS — Z6830 Body mass index (BMI) 30.0-30.9, adult: Secondary | ICD-10-CM

## 2020-11-18 DIAGNOSIS — Z794 Long term (current) use of insulin: Secondary | ICD-10-CM

## 2020-11-18 DIAGNOSIS — N3000 Acute cystitis without hematuria: Principal | ICD-10-CM | POA: Diagnosis present

## 2020-11-18 DIAGNOSIS — I129 Hypertensive chronic kidney disease with stage 1 through stage 4 chronic kidney disease, or unspecified chronic kidney disease: Secondary | ICD-10-CM | POA: Diagnosis not present

## 2020-11-18 DIAGNOSIS — G319 Degenerative disease of nervous system, unspecified: Secondary | ICD-10-CM | POA: Diagnosis not present

## 2020-11-18 DIAGNOSIS — I6601 Occlusion and stenosis of right middle cerebral artery: Secondary | ICD-10-CM | POA: Diagnosis not present

## 2020-11-18 DIAGNOSIS — Z20822 Contact with and (suspected) exposure to covid-19: Secondary | ICD-10-CM | POA: Diagnosis not present

## 2020-11-18 DIAGNOSIS — E860 Dehydration: Secondary | ICD-10-CM | POA: Diagnosis present

## 2020-11-18 DIAGNOSIS — Z515 Encounter for palliative care: Secondary | ICD-10-CM | POA: Diagnosis not present

## 2020-11-18 DIAGNOSIS — T45526A Underdosing of antithrombotic drugs, initial encounter: Secondary | ICD-10-CM | POA: Diagnosis present

## 2020-11-18 DIAGNOSIS — R1312 Dysphagia, oropharyngeal phase: Secondary | ICD-10-CM | POA: Diagnosis present

## 2020-11-18 DIAGNOSIS — R059 Cough, unspecified: Secondary | ICD-10-CM

## 2020-11-18 DIAGNOSIS — E1165 Type 2 diabetes mellitus with hyperglycemia: Secondary | ICD-10-CM | POA: Diagnosis not present

## 2020-11-18 DIAGNOSIS — I69344 Monoplegia of lower limb following cerebral infarction affecting left non-dominant side: Secondary | ICD-10-CM | POA: Diagnosis not present

## 2020-11-18 DIAGNOSIS — G9341 Metabolic encephalopathy: Secondary | ICD-10-CM | POA: Diagnosis present

## 2020-11-18 DIAGNOSIS — Z743 Need for continuous supervision: Secondary | ICD-10-CM | POA: Diagnosis not present

## 2020-11-18 DIAGNOSIS — Z87820 Personal history of traumatic brain injury: Secondary | ICD-10-CM | POA: Diagnosis not present

## 2020-11-18 DIAGNOSIS — Z66 Do not resuscitate: Secondary | ICD-10-CM | POA: Diagnosis present

## 2020-11-18 DIAGNOSIS — G459 Transient cerebral ischemic attack, unspecified: Secondary | ICD-10-CM

## 2020-11-18 DIAGNOSIS — I63512 Cerebral infarction due to unspecified occlusion or stenosis of left middle cerebral artery: Secondary | ICD-10-CM | POA: Diagnosis not present

## 2020-11-18 DIAGNOSIS — Z91120 Patient's intentional underdosing of medication regimen due to financial hardship: Secondary | ICD-10-CM

## 2020-11-18 DIAGNOSIS — I2510 Atherosclerotic heart disease of native coronary artery without angina pectoris: Secondary | ICD-10-CM | POA: Diagnosis present

## 2020-11-18 DIAGNOSIS — R29898 Other symptoms and signs involving the musculoskeletal system: Secondary | ICD-10-CM | POA: Diagnosis not present

## 2020-11-18 DIAGNOSIS — N1831 Chronic kidney disease, stage 3a: Secondary | ICD-10-CM | POA: Diagnosis not present

## 2020-11-18 DIAGNOSIS — E1142 Type 2 diabetes mellitus with diabetic polyneuropathy: Secondary | ICD-10-CM | POA: Diagnosis not present

## 2020-11-18 DIAGNOSIS — K219 Gastro-esophageal reflux disease without esophagitis: Secondary | ICD-10-CM | POA: Diagnosis not present

## 2020-11-18 DIAGNOSIS — N400 Enlarged prostate without lower urinary tract symptoms: Secondary | ICD-10-CM | POA: Diagnosis present

## 2020-11-18 DIAGNOSIS — M6281 Muscle weakness (generalized): Secondary | ICD-10-CM | POA: Diagnosis not present

## 2020-11-18 DIAGNOSIS — Z7982 Long term (current) use of aspirin: Secondary | ICD-10-CM

## 2020-11-18 DIAGNOSIS — R0902 Hypoxemia: Secondary | ICD-10-CM | POA: Diagnosis not present

## 2020-11-18 DIAGNOSIS — R0982 Postnasal drip: Secondary | ICD-10-CM | POA: Diagnosis present

## 2020-11-18 DIAGNOSIS — M2550 Pain in unspecified joint: Secondary | ICD-10-CM | POA: Diagnosis not present

## 2020-11-18 DIAGNOSIS — Z803 Family history of malignant neoplasm of breast: Secondary | ICD-10-CM

## 2020-11-18 DIAGNOSIS — I69821 Dysphasia following other cerebrovascular disease: Secondary | ICD-10-CM | POA: Diagnosis not present

## 2020-11-18 DIAGNOSIS — Z7984 Long term (current) use of oral hypoglycemic drugs: Secondary | ICD-10-CM

## 2020-11-18 DIAGNOSIS — I69391 Dysphagia following cerebral infarction: Secondary | ICD-10-CM | POA: Diagnosis not present

## 2020-11-18 DIAGNOSIS — F32A Depression, unspecified: Secondary | ICD-10-CM | POA: Diagnosis not present

## 2020-11-18 DIAGNOSIS — Z950 Presence of cardiac pacemaker: Secondary | ICD-10-CM

## 2020-11-18 DIAGNOSIS — Z8249 Family history of ischemic heart disease and other diseases of the circulatory system: Secondary | ICD-10-CM

## 2020-11-18 DIAGNOSIS — J9811 Atelectasis: Secondary | ICD-10-CM | POA: Diagnosis not present

## 2020-11-18 DIAGNOSIS — H47019 Ischemic optic neuropathy, unspecified eye: Secondary | ICD-10-CM | POA: Diagnosis not present

## 2020-11-18 DIAGNOSIS — I10 Essential (primary) hypertension: Secondary | ICD-10-CM | POA: Diagnosis not present

## 2020-11-18 DIAGNOSIS — Z7902 Long term (current) use of antithrombotics/antiplatelets: Secondary | ICD-10-CM

## 2020-11-18 DIAGNOSIS — I69828 Other speech and language deficits following other cerebrovascular disease: Secondary | ICD-10-CM | POA: Diagnosis not present

## 2020-11-18 DIAGNOSIS — Z87891 Personal history of nicotine dependence: Secondary | ICD-10-CM

## 2020-11-18 DIAGNOSIS — R6889 Other general symptoms and signs: Secondary | ICD-10-CM | POA: Diagnosis not present

## 2020-11-18 DIAGNOSIS — E559 Vitamin D deficiency, unspecified: Secondary | ICD-10-CM | POA: Diagnosis not present

## 2020-11-18 DIAGNOSIS — G4733 Obstructive sleep apnea (adult) (pediatric): Secondary | ICD-10-CM | POA: Diagnosis not present

## 2020-11-18 DIAGNOSIS — J410 Simple chronic bronchitis: Secondary | ICD-10-CM | POA: Diagnosis not present

## 2020-11-18 DIAGNOSIS — Z7401 Bed confinement status: Secondary | ICD-10-CM | POA: Diagnosis not present

## 2020-11-18 DIAGNOSIS — I6523 Occlusion and stenosis of bilateral carotid arteries: Secondary | ICD-10-CM | POA: Diagnosis not present

## 2020-11-18 DIAGNOSIS — J309 Allergic rhinitis, unspecified: Secondary | ICD-10-CM | POA: Diagnosis not present

## 2020-11-18 DIAGNOSIS — N3281 Overactive bladder: Secondary | ICD-10-CM | POA: Diagnosis not present

## 2020-11-18 LAB — DIFFERENTIAL
Abs Immature Granulocytes: 0.05 10*3/uL (ref 0.00–0.07)
Basophils Absolute: 0 10*3/uL (ref 0.0–0.1)
Basophils Relative: 1 %
Eosinophils Absolute: 0.4 10*3/uL (ref 0.0–0.5)
Eosinophils Relative: 5 %
Immature Granulocytes: 1 %
Lymphocytes Relative: 9 %
Lymphs Abs: 0.8 10*3/uL (ref 0.7–4.0)
Monocytes Absolute: 0.8 10*3/uL (ref 0.1–1.0)
Monocytes Relative: 10 %
Neutro Abs: 6 10*3/uL (ref 1.7–7.7)
Neutrophils Relative %: 74 %

## 2020-11-18 LAB — PROTIME-INR
INR: 1.2 (ref 0.8–1.2)
Prothrombin Time: 14.7 seconds (ref 11.4–15.2)

## 2020-11-18 LAB — URINALYSIS, ROUTINE W REFLEX MICROSCOPIC
Bilirubin Urine: NEGATIVE
Glucose, UA: 50 mg/dL — AB
Ketones, ur: 5 mg/dL — AB
Nitrite: NEGATIVE
Protein, ur: 30 mg/dL — AB
Specific Gravity, Urine: 1.028 (ref 1.005–1.030)
pH: 6 (ref 5.0–8.0)

## 2020-11-18 LAB — I-STAT CHEM 8, ED
BUN: 31 mg/dL — ABNORMAL HIGH (ref 8–23)
Calcium, Ion: 1.04 mmol/L — ABNORMAL LOW (ref 1.15–1.40)
Chloride: 100 mmol/L (ref 98–111)
Creatinine, Ser: 1.6 mg/dL — ABNORMAL HIGH (ref 0.61–1.24)
Glucose, Bld: 213 mg/dL — ABNORMAL HIGH (ref 70–99)
HCT: 29 % — ABNORMAL LOW (ref 39.0–52.0)
Hemoglobin: 9.9 g/dL — ABNORMAL LOW (ref 13.0–17.0)
Potassium: 4.1 mmol/L (ref 3.5–5.1)
Sodium: 135 mmol/L (ref 135–145)
TCO2: 25 mmol/L (ref 22–32)

## 2020-11-18 LAB — ETHANOL: Alcohol, Ethyl (B): <10 mg/dL (ref <10)

## 2020-11-18 LAB — RESP PANEL BY RT-PCR (FLU A&B, COVID) ARPGX2
Influenza A by PCR: NEGATIVE
Influenza B by PCR: NEGATIVE
SARS Coronavirus 2 by RT PCR: NEGATIVE

## 2020-11-18 LAB — CBC
HCT: 34.5 % — ABNORMAL LOW (ref 39.0–52.0)
Hemoglobin: 10.6 g/dL — ABNORMAL LOW (ref 13.0–17.0)
MCH: 26.3 pg (ref 26.0–34.0)
MCHC: 30.7 g/dL (ref 30.0–36.0)
MCV: 85.6 fL (ref 80.0–100.0)
Platelets: 193 10*3/uL (ref 150–400)
RBC: 4.03 MIL/uL — ABNORMAL LOW (ref 4.22–5.81)
RDW: 13.6 % (ref 11.5–15.5)
WBC: 8.1 10*3/uL (ref 4.0–10.5)
nRBC: 0 % (ref 0.0–0.2)

## 2020-11-18 LAB — COMPREHENSIVE METABOLIC PANEL
ALT: 15 U/L (ref 0–44)
AST: 18 U/L (ref 15–41)
Albumin: 3.3 g/dL — ABNORMAL LOW (ref 3.5–5.0)
Alkaline Phosphatase: 111 U/L (ref 38–126)
Anion gap: 8 (ref 5–15)
BUN: 30 mg/dL — ABNORMAL HIGH (ref 8–23)
CO2: 27 mmol/L (ref 22–32)
Calcium: 9.1 mg/dL (ref 8.9–10.3)
Chloride: 101 mmol/L (ref 98–111)
Creatinine, Ser: 1.81 mg/dL — ABNORMAL HIGH (ref 0.61–1.24)
GFR, Estimated: 37 mL/min — ABNORMAL LOW (ref >60)
Glucose, Bld: 235 mg/dL — ABNORMAL HIGH (ref 70–99)
Potassium: 4.1 mmol/L (ref 3.5–5.1)
Sodium: 136 mmol/L (ref 135–145)
Total Bilirubin: 0.8 mg/dL (ref 0.3–1.2)
Total Protein: 6.7 g/dL (ref 6.5–8.1)

## 2020-11-18 LAB — GLUCOSE, CAPILLARY
Glucose-Capillary: 174 mg/dL — ABNORMAL HIGH (ref 70–99)
Glucose-Capillary: 229 mg/dL — ABNORMAL HIGH (ref 70–99)

## 2020-11-18 LAB — RAPID URINE DRUG SCREEN, HOSP PERFORMED
Amphetamines: NOT DETECTED
Barbiturates: NOT DETECTED
Benzodiazepines: NOT DETECTED
Cocaine: NOT DETECTED
Opiates: POSITIVE — AB
Tetrahydrocannabinol: NOT DETECTED

## 2020-11-18 LAB — TSH: TSH: 0.459 u[IU]/mL (ref 0.350–4.500)

## 2020-11-18 LAB — APTT: aPTT: 29 seconds (ref 24–36)

## 2020-11-18 LAB — LACTIC ACID, PLASMA: Lactic Acid, Venous: 1.1 mmol/L (ref 0.5–1.9)

## 2020-11-18 LAB — HEMOGLOBIN A1C
Hgb A1c MFr Bld: 7.7 % — ABNORMAL HIGH (ref 4.8–5.6)
Mean Plasma Glucose: 174.29 mg/dL

## 2020-11-18 MED ORDER — ACETAMINOPHEN 650 MG RE SUPP: 650.0000 mg | RECTAL | Status: DC | PRN

## 2020-11-18 MED ORDER — TICAGRELOR 90 MG PO TABS: 45.0000 mg | ORAL_TABLET | Freq: Two times a day (BID) | ORAL | Status: DC

## 2020-11-18 MED ORDER — TICAGRELOR 90 MG PO TABS
45.0000 mg | ORAL_TABLET | Freq: Two times a day (BID) | ORAL | Status: DC
  Administered 2020-11-18 – 2020-11-25 (×15): 45 mg via ORAL
  Filled 2020-11-18 (×15): qty 1

## 2020-11-18 MED ORDER — HEPARIN SODIUM (PORCINE) 5000 UNIT/ML IJ SOLN
5000.0000 [IU] | Freq: Two times a day (BID) | INTRAMUSCULAR | Status: DC
  Administered 2020-11-18 – 2020-11-25 (×14): 5000 [IU] via SUBCUTANEOUS
  Filled 2020-11-18 (×14): qty 1

## 2020-11-18 MED ORDER — STROKE: EARLY STAGES OF RECOVERY BOOK
Freq: Once | Status: DC
  Filled 2020-11-18: qty 1

## 2020-11-18 MED ORDER — LEVETIRACETAM 500 MG PO TABS
500.0000 mg | ORAL_TABLET | Freq: Two times a day (BID) | ORAL | Status: DC
  Administered 2020-11-18: 500 mg via ORAL
  Filled 2020-11-18: qty 1

## 2020-11-18 MED ORDER — ASPIRIN 81 MG PO CHEW: 81.0000 mg | CHEWABLE_TABLET | Freq: Every day | ORAL | Status: DC

## 2020-11-18 MED ORDER — LORATADINE 10 MG PO TABS
10.0000 mg | ORAL_TABLET | Freq: Every evening | ORAL | Status: DC
  Administered 2020-11-19 – 2020-11-24 (×6): 10 mg via ORAL
  Filled 2020-11-18 (×6): qty 1

## 2020-11-18 MED ORDER — INSULIN GLARGINE 100 UNIT/ML ~~LOC~~ SOLN
10.0000 [IU] | Freq: Every day | SUBCUTANEOUS | Status: DC
  Administered 2020-11-19 – 2020-11-25 (×7): 10 [IU] via SUBCUTANEOUS
  Filled 2020-11-18 (×7): qty 0.1

## 2020-11-18 MED ORDER — FLUOXETINE HCL 20 MG PO CAPS
20.0000 mg | ORAL_CAPSULE | Freq: Every day | ORAL | Status: DC
  Administered 2020-11-19 – 2020-11-25 (×7): 20 mg via ORAL
  Filled 2020-11-18 (×7): qty 1

## 2020-11-18 MED ORDER — TRIAMCINOLONE ACETONIDE 0.1 % EX CREA: 1.0000 "application " | TOPICAL_CREAM | Freq: Every day | CUTANEOUS | Status: DC | PRN

## 2020-11-18 MED ORDER — IOHEXOL 350 MG/ML SOLN
75.0000 mL | Freq: Once | INTRAVENOUS | Status: AC | PRN
  Administered 2020-11-18: 75 mL via INTRAVENOUS

## 2020-11-18 MED ORDER — ATORVASTATIN CALCIUM 10 MG PO TABS
10.0000 mg | ORAL_TABLET | Freq: Every day | ORAL | Status: DC
  Administered 2020-11-19 – 2020-11-25 (×7): 10 mg via ORAL
  Filled 2020-11-18 (×7): qty 1

## 2020-11-18 MED ORDER — HYDROCODONE BIT-HOMATROP MBR 5-1.5 MG/5ML PO SOLN: 5.0000 mL | Freq: Three times a day (TID) | ORAL | Status: DC | PRN

## 2020-11-18 MED ORDER — ACETAMINOPHEN 325 MG PO TABS: 650.0000 mg | ORAL_TABLET | ORAL | Status: DC | PRN

## 2020-11-18 MED ORDER — LEVETIRACETAM 100 MG/ML PO SOLN
500.0000 mg | Freq: Two times a day (BID) | ORAL | Status: DC
  Administered 2020-11-18 – 2020-11-25 (×14): 500 mg via ORAL
  Filled 2020-11-18 (×14): qty 5

## 2020-11-18 MED ORDER — ASPIRIN EC 81 MG PO TBEC
81.0000 mg | DELAYED_RELEASE_TABLET | Freq: Every day | ORAL | Status: DC
  Administered 2020-11-18 – 2020-11-25 (×8): 81 mg via ORAL
  Filled 2020-11-18 (×8): qty 1

## 2020-11-18 MED ORDER — ACETAMINOPHEN 160 MG/5ML PO SOLN: 650.0000 mg | ORAL | Status: DC | PRN

## 2020-11-18 MED ORDER — HYDRALAZINE HCL 20 MG/ML IJ SOLN: 5.0000 mg | Freq: Four times a day (QID) | INTRAMUSCULAR | Status: DC | PRN

## 2020-11-18 MED ORDER — PANTOPRAZOLE SODIUM 40 MG PO TBEC
40.0000 mg | DELAYED_RELEASE_TABLET | Freq: Every day | ORAL | Status: DC
  Administered 2020-11-19 – 2020-11-25 (×7): 40 mg via ORAL
  Filled 2020-11-18 (×7): qty 1

## 2020-11-18 MED ORDER — DARIFENACIN HYDROBROMIDE ER 7.5 MG PO TB24
7.5000 mg | ORAL_TABLET | Freq: Every day | ORAL | Status: DC
  Administered 2020-11-19 – 2020-11-25 (×7): 7.5 mg via ORAL
  Filled 2020-11-18 (×8): qty 1

## 2020-11-18 MED ORDER — CLONIDINE HCL 0.1 MG/24HR TD PTWK
0.1000 mg | MEDICATED_PATCH | TRANSDERMAL | Status: DC
  Administered 2020-11-20: 0.1 mg via TRANSDERMAL
  Filled 2020-11-18: qty 1

## 2020-11-18 MED ORDER — SENNOSIDES-DOCUSATE SODIUM 8.6-50 MG PO TABS: 1.0000 | ORAL_TABLET | Freq: Every evening | ORAL | Status: DC | PRN

## 2020-11-18 MED ORDER — FERROUS SULFATE 325 (65 FE) MG PO TABS
325.0000 mg | ORAL_TABLET | Freq: Every day | ORAL | Status: DC
  Administered 2020-11-19 – 2020-11-25 (×7): 325 mg via ORAL
  Filled 2020-11-18 (×7): qty 1

## 2020-11-18 MED ORDER — SODIUM CHLORIDE 0.9 % IV SOLN: INTRAVENOUS | Status: DC

## 2020-11-18 MED ORDER — FERRIC MALTOL 30 MG PO CAPS: 1.0000 | ORAL_CAPSULE | Freq: Every day | ORAL | Status: DC

## 2020-11-18 MED ORDER — INSULIN ASPART 100 UNIT/ML IJ SOLN
0.0000 [IU] | Freq: Three times a day (TID) | INTRAMUSCULAR | Status: DC
  Administered 2020-11-18: 2 [IU] via SUBCUTANEOUS
  Administered 2020-11-19: 1 [IU] via SUBCUTANEOUS
  Administered 2020-11-19: 3 [IU] via SUBCUTANEOUS
  Administered 2020-11-19: 2 [IU] via SUBCUTANEOUS
  Administered 2020-11-20: 5 [IU] via SUBCUTANEOUS
  Administered 2020-11-20: 2 [IU] via SUBCUTANEOUS
  Administered 2020-11-21: 3 [IU] via SUBCUTANEOUS
  Administered 2020-11-21: 1 [IU] via SUBCUTANEOUS
  Administered 2020-11-21: 3 [IU] via SUBCUTANEOUS
  Administered 2020-11-22: 2 [IU] via SUBCUTANEOUS
  Administered 2020-11-22: 5 [IU] via SUBCUTANEOUS
  Administered 2020-11-22: 2 [IU] via SUBCUTANEOUS
  Administered 2020-11-23 (×2): 3 [IU] via SUBCUTANEOUS
  Administered 2020-11-24 – 2020-11-25 (×5): 2 [IU] via SUBCUTANEOUS

## 2020-11-18 MED ORDER — AZELASTINE HCL 0.1 % NA SOLN: 1.0000 | Freq: Two times a day (BID) | NASAL | Status: DC

## 2020-11-18 NOTE — ED Provider Notes (Signed)
[place] [facility] EMERGENCY DEPARTMENT Provider Note   CSN: [id number] Arrival date & time: 11/18/20  4696     History Chief Complaint  Patient presents with  . Weakness    Frank [name] is a 80 y.o. male.  Patient presents with left leg weakness.  When he got up this morning he slid to the floor.  He said his left leg was more weak than usual.  He was fine when he went to bed.  Patient has a history of diabetes hypertension and had a recent stroke with right-sided weakness a couple months ago  The history is provided by the patient and medical records.  Weakness Severity:  Moderate Onset quality:  Sudden Timing:  Constant Progression:  Waxing and waning Chronicity:  New Context: not alcohol use   Relieved by:  Nothing Worsened by:  Nothing Ineffective treatments:  None tried Associated symptoms: no abdominal pain, no chest pain, no cough, no diarrhea, no frequency, no headaches and no seizures        Past Medical History:  Diagnosis Date  . Allergy    Rhinitis  . Anemia    NOS iron deficient and B12 deficient  . Arthritis   . 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 Diabetes mellitus    Type 2  . GERD (gastroesophageal reflux disease)   . Hyperlipidemia   . Hypertension   . 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 OSA (obstructive sleep apnea)    cpap  . TBI (traumatic brain injury) [facility])     Patient Active Problem List   Diagnosis Date Noted  . Simple chronic bronchitis ([place]) 11/13/2020  . Iron deficiency anemia secondary to inadequate dietary iron intake 11/06/2020  . DNR (do not resuscitate) discussion 10/17/2020  . Stroke (cerebrum) ([place]) 09/09/2020  . 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 Chronic renal disease, stage 3, moderately decreased glomerular filtration rate (GFR) between 30-59  mL/min/1.73 square meter ([place]) 11/21/2018  . GERD without esophagitis 07/22/2017  . Depression   . Type 2 diabetes mellitus with peripheral neuropathy ([name])   . OAB (overactive bladder) 11/26/2014  . Routine general medical examination at a health care facility 04/06/2014  . Vitamin D deficiency 07/23/2010  . Obstructive sleep apnea 09/13/2008  . Hyperlipidemia with target LDL less than 100 07/27/2007  . BPH (benign prostatic hyperplasia) 06/21/2007  . 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 B12 deficiency anemia 04/15/2006  . Essential hypertension 04/15/2006  . Allergic rhinitis 04/15/2006    Past Surgical History:  Procedure Laterality Date  . APPENDECTOMY    . arm fracture Left 2011   with hardware  . BURR HOLE Bilateral 08/04/2017   Procedure: Haskell Flirt;  Surgeon: [name], MD;  Location: [facility];  Service: Neurosurgery;  Laterality: Bilateral;  . COLONOSCOPY    . CRANIOTOMY Left 12/01/2017   Procedure: Trudee Kuster Holes CRANIOTOMY HEMATOMA EVACUATION SUBDURAL;  Surgeon: [name], MD;  Location: [facility];  Service: Neurosurgery;  Laterality: Left;  . ESOPHAGOGASTRODUODENOSCOPY  2006   gastritis  . IR PERCUTANEOUS ART THROMBECTOMY/INFUSION INTRACRANIAL INC DIAG ANGIO  09/09/2020  . PACEMAKER IMPLANT N/A 05/14/2020   Procedure: PACEMAKER IMPLANT;  Surgeon: [name], MD;  Location: [facility] CV LAB;  Service: Cardiovascular;  Laterality: N/A;  . RADIOLOGY WITH ANESTHESIA N/A 09/09/2020   Procedure: IR WITH ANESTHESIA;  Surgeon: Radiologist,  Medication, MD;  Location: [facility];  Service: Radiology;  Laterality: N/A;  . ROTATOR CUFF REPAIR    . SEPTOPLASTY  1959   Deviated Septum  . 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 Heart disease Mother   . Breast cancer Mother   . Stroke Father   . Allergies Father        Father and children  . Coronary artery disease Brother   . Diabetes Neg Hx   . Colon cancer Neg Hx      Social History   Tobacco Use  . Smoking status: Former Smoker    Packs/day: 1.00    Quit date: 07/14/1983    Years since quitting: 37.3  . Smokeless tobacco: Never Used  Vaping Use  . Vaping Use: Never used  Substance Use Topics  . Alcohol use: Yes    Alcohol/week: 2.0 standard drinks    Types: 2 Standard drinks or equivalent per week    Comment: occassionally  . Drug use: No    Home Medications Prior to Admission medications   Medication Sig Start Date End Date Taking? Authorizing Provider  amLODipine (NORVASC) 10 MG tablet Take 1 tablet (10 mg total) by mouth daily. 09/18/20   Briant Cedar, MD  aspirin 81 MG chewable tablet Chew 1 tablet (81 mg total) by mouth daily. 09/18/20   Briant Cedar, MD  atorvastatin (LIPITOR) 10 MG tablet Take 1 tablet (10 mg total) by mouth daily. 09/18/20   Briant Cedar, MD  azelastine (ASTELIN) 0.1 % nasal spray Place 1 spray into both nostrils 2 (two) times daily. Use in each nostril as directed 11/27/19   Janith Lima, MD  cloNIDine (CATAPRES - DOSED IN MG/24 HR) 0.1 mg/24hr patch Place 1 patch (0.1 mg total) onto the skin once a week. 11/13/20   Janith Lima, MD  Ferric Maltol (ACCRUFER) 30 MG CAPS Take 1 capsule by mouth in the morning and at bedtime. 11/06/20   Janith Lima, MD  FLUoxetine (PROZAC) 20 MG capsule TAKE 1 CAPSULE BY MOUTH  DAILY 09/25/20   Janith Lima, MD  glipiZIDE (GLUCOTROL XL) 10 MG 24 hr tablet TAKE 1 TABLET BY MOUTH  DAILY 09/25/20   Janith Lima, MD  Glucagon (GVOKE HYPOPEN 2-PACK) 1 MG/0.2ML SOAJ Inject 1 Act into the skin daily as needed. Patient taking differently: Inject 1 each into the skin as needed (low blood sugar). 04/30/20   Janith Lima, MD  HYDROcodone bit-homatropine (HYCODAN) 5-1.5 MG/5ML syrup Take 5 mLs by mouth every 8 (eight) hours as needed for cough. 11/13/20   Janith Lima, MD  insulin glargine, 2 Unit Dial, (TOUJEO MAX SOLOSTAR) 300 UNIT/ML Solostar Pen Inject 10 Units  into the skin daily. 04/16/20   Janith Lima, MD  Insulin Pen Needle 32G X 6 MM MISC 1 Act by Does not apply route daily. 04/08/20   Janith Lima, MD  levETIRAcetam (KEPPRA) 100 MG/ML solution Take 5 mLs (500 mg total) by mouth 2 (two) times daily. 11/14/20   Janith Lima, MD  levETIRAcetam (KEPPRA) 500 MG tablet Take 500 mg by mouth 2 (two) times daily. 10/16/20   [provider]  loratadine (CLARITIN) 10 MG tablet Take 1 tablet (10 mg total) by mouth every evening. 09/18/20   Briant Cedar, MD  pantoprazole (PROTONIX) 40 MG tablet TAKE 1 TABLET BY MOUTH  DAILY  10/03/20   Janith Lima, MD  PRESCRIPTION MEDICATION CPAP- At bedtime    [provider]  solifenacin (VESICARE) 10 MG tablet Take 1 tablet (10 mg total) by mouth daily. 11/14/20   Janith Lima, MD  ticagrelor (BRILINTA) 90 MG TABS tablet Take 0.5 tablets (45 mg total) by mouth 2 (two) times daily. 11/14/20   Janith Lima, MD  triamcinolone cream (KENALOG) 0.1 % Apply 1 application topically daily as needed (to affected areas).  07/26/19   [provider]    Allergies    Lisinopril, Invokana [canagliflozin], Penicillins, and Sulfamethoxazole  Review of Systems   Review of Systems  Constitutional: Negative for appetite change and fatigue.  HENT: Negative for congestion, ear discharge and sinus pressure.   Eyes: Negative for discharge.  Respiratory: Negative for cough.   Cardiovascular: Negative for chest pain.  Gastrointestinal: Negative for abdominal pain and diarrhea.  Genitourinary: Negative for frequency and hematuria.  Musculoskeletal: Negative for back pain.  Skin: Negative for rash.  Neurological: Positive for weakness. Negative for seizures and headaches.  Psychiatric/Behavioral: Negative for hallucinations.    Physical Exam Updated Vital Signs BP (!) 152/72   Pulse 98   Temp 97.9 F (36.6 C) (Oral)   Resp 19   Ht 5\' 9"  (1.753 m)   Wt 93 kg   SpO2 91%   BMI 30.27 kg/m    Physical Exam Vitals and nursing note reviewed.  Constitutional:      Appearance: He is well-developed.  HENT:     Head: Normocephalic.     Nose: Nose normal.  Eyes:     General: No scleral icterus.    Conjunctiva/sclera: Conjunctivae normal.  Neck:     Thyroid: No thyromegaly.  Cardiovascular:     Rate and Rhythm: Normal rate and regular rhythm.     Heart sounds: No murmur heard. No friction rub. No gallop.   Pulmonary:     Breath sounds: No stridor. No wheezing or rales.  Chest:     Chest wall: No tenderness.  Abdominal:     General: There is no distension.     Tenderness: There is no abdominal tenderness. There is no rebound.  Musculoskeletal:     Cervical back: Neck supple.     Comments: Weakness in his left leg that is preventing him from walking  Lymphadenopathy:     Cervical: No cervical adenopathy.  Skin:    Findings: No erythema or rash.  Neurological:     Mental Status: He is alert and oriented to person, place, and time.     Motor: No abnormal muscle tone.     Coordination: Coordination normal.  Psychiatric:        Behavior: Behavior normal.     ED Results / Procedures / Treatments   Labs (all labs ordered are listed, but only abnormal results are displayed) Labs Reviewed  CBC - Abnormal; Notable for the following components:      Result Value   RBC 4.03 (*)    Hemoglobin 10.6 (*)    HCT 34.5 (*)    All other components within normal limits  COMPREHENSIVE METABOLIC PANEL - Abnormal; Notable for the following components:   Glucose, Bld 235 (*)    BUN 30 (*)    Creatinine, Ser 1.81 (*)    Albumin 3.3 (*)    GFR, Estimated 37 (*)    All other components within normal limits  I-STAT CHEM 8, ED - Abnormal; Notable for the following components:  BUN 31 (*)    Creatinine, Ser 1.60 (*)    Glucose, Bld 213 (*)    Calcium, Ion 1.04 (*)    Hemoglobin 9.9 (*)    HCT 29.0 (*)    All other components within normal limits  RESP PANEL BY RT-PCR (FLU  A&B, COVID) ARPGX2  ETHANOL  DIFFERENTIAL  PROTIME-INR  APTT  RAPID URINE DRUG SCREEN, HOSP PERFORMED  URINALYSIS, ROUTINE W REFLEX MICROSCOPIC    EKG None  Radiology CT HEAD CODE STROKE WO CONTRAST  Result Date: 11/18/2020 CLINICAL DATA:  Code stroke. 80 year old male last known well at midnight. Right MCA infarct with hemorrhagic transformation in March. Right MCA M2 stent. History of unresolved subdural hematomas. EXAM: CT HEAD WITHOUT CONTRAST TECHNIQUE: Contiguous axial images were obtained from the base of the skull through the vertex without intravenous contrast. COMPARISON:  Head CT 09/18/2020 and earlier. FINDINGS: Brain: Mixed density bilateral subdural hematomas are stable since March, measuring up to 8-9 mm in thickness bilaterally and most pronounced over the posterior convexities. Developing encephalomalacia throughout the posterior right MCA territory with resolved hyperdense hemorrhage and mass effect since March. Hypodensity in the posterior left corona radiata, posterior left internal capsule and medial left thalamus appears increased since March. Gray-white matter differentiation elsewhere appears stable. No acute cortically based infarct identified. Vascular: Mild Calcified atherosclerosis at the skull base. Right MCA branch stent in the sylvian fissure appears stable. Skull: Previous left superior convexity craniotomy, bilateral burr holes. No acute osseous abnormality identified. Sinuses/Orbits: Mildly improved ethmoid sinus mucosal thickening. Other Visualized paranasal sinuses and mastoids are stable and well aerated. Other: No acute orbit or scalp soft tissue findings. ASPECTS [facility] Stroke Program Early CT Score) Total score (0-10 with 10 being normal): 10 IMPRESSION: 1. No acute cortically based infarct or acute intracranial hemorrhage identified. ASPECTS 10. 2. Developing encephalomalacia in the posterior right hemisphere and resolved hemorrhagic transformation there since  March. 3. Questionable increased small vessel disease in the posterior limb left internal capsule and left thalamus since March. 4. Unresolved small bilateral mixed density subdural hematomas remain stable. Study discussed by telephone with Dr. Donnetta Simpers on 11/18/2020 at 09:28 . Electronically Signed   By: Genevie Ann M.D.   On: 11/18/2020 09:30   CT ANGIO HEAD CODE STROKE  Result Date: 11/18/2020 CLINICAL DATA:  Code stroke. 80 year old male last known well at midnight. Right MCA infarct with hemorrhagic transformation in March. Right MCA M2 stent. History of unresolved subdural hematomas. EXAM: CT ANGIOGRAPHY HEAD AND NECK TECHNIQUE: Multidetector CT imaging of the head and neck was performed using the standard protocol during bolus administration of intravenous contrast. Multiplanar CT image reconstructions and MIPs were obtained to evaluate the vascular anatomy. Carotid stenosis measurements (when applicable) are obtained utilizing NASCET criteria, using the distal internal carotid diameter as the denominator. CONTRAST:  52mL OMNIPAQUE IOHEXOL 350 MG/ML SOLN COMPARISON:  Plain head CT 0919 hours today. CTA head and neck and CTP 09/09/20. FINDINGS: CTA NECK Skeleton: Stable.  No acute osseous abnormality identified. Upper chest: Left chest cardiac pacemaker redemonstrated. Patchy nonspecific upper lung opacity has not significantly changed since February and may be scarring. No superior mediastinal lymphadenopathy. Visible central pulmonary arteries appear patent. Other neck: Stable.  No acute finding. Aortic arch: Stable mild calcified arch atherosclerosis. Three vessel arch configuration. Right carotid system: Stable right CCA plaque without significant stenosis proximal to the bifurcation. Bulky calcified plaque at the right ICA origin and bulb resulting in radiographic string sign stenosis appears stable  on series 5, image 104. The right ICA remains patent to the skull base. Left carotid system: Stable  left CCA, left carotid bifurcation, proximal left ICA, and mild distal left cervical ICA calcified plaque with less than 50% stenosis. Vertebral arteries: Stable, and notable for mild to moderate right vertebral artery origin stenosis due to calcified plaque. CTA HEAD Posterior circulation: Stable and negative. Mildly dominant left V4 segment. Both posterior communicating arteries are small but present. Anterior circulation: Both ICA siphons remain patent. Calcified siphon atherosclerosis is stable, mild to moderate and not associated with significant siphon stenosis. Normal posterior communicating artery and ophthalmic artery origins. Patent carotid termini, MCA and ACA origins. Dominant left A1 as before. 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 Left MCA M1 and left MCA branches are stable and within normal limits. Right MCA M1 and right MCA bifurcation remain patent without stenosis. Posterior right M2 stent has been placed traversing the embolized calcification demonstrated in February. The stent appears patent and enhancing (series 15, image 10). Although posterior right MCA division branches distal to the stent remain diminutive or absent. Other right MCA branches appear stable. Venous sinuses: Patent. Anatomic variants: Dominant left ACA A1. Review of the MIP images confirms the above findings IMPRESSION: 1. Negative for emergent large vessel occlusion. 2. Stented posterior Right MCA branch where the embolized calcification was noted in February. The stent is patent. And other MCA branches remain stable. 3. 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 Electronically Signed   By: Genevie Ann M.D.   On: 11/18/2020 10:09   CT ANGIO NECK  CODE STROKE  Result Date: 11/18/2020 CLINICAL DATA:  Code stroke. 80 year old male last known well at midnight. Right MCA infarct with hemorrhagic transformation in March. Right MCA M2 stent. History of unresolved subdural hematomas. EXAM: CT ANGIOGRAPHY HEAD AND NECK TECHNIQUE: Multidetector CT imaging of the head and neck was performed using the standard protocol during bolus administration of intravenous contrast. Multiplanar CT image reconstructions and MIPs were obtained to evaluate the vascular anatomy. Carotid stenosis measurements (when applicable) are obtained utilizing NASCET criteria, using the distal internal carotid diameter as the denominator. CONTRAST:  43mL OMNIPAQUE IOHEXOL 350 MG/ML SOLN COMPARISON:  Plain head CT 0919 hours today. CTA head and neck and CTP 09/09/20. FINDINGS: CTA NECK Skeleton: Stable.  No acute osseous abnormality identified. Upper chest: Left chest cardiac pacemaker redemonstrated. Patchy nonspecific upper lung opacity has not significantly changed since February and may be scarring. No superior mediastinal lymphadenopathy. Visible central pulmonary arteries appear patent. Other neck: Stable.  No acute finding. Aortic arch: Stable mild calcified arch atherosclerosis. Three vessel arch configuration. Right carotid system: Stable right CCA plaque without significant stenosis proximal to the bifurcation. Bulky calcified plaque at the right ICA origin and bulb resulting in radiographic string sign stenosis appears stable on series 5, image 104. The right ICA remains patent to the skull base. Left carotid system: Stable left CCA, left carotid bifurcation, proximal left ICA, and mild distal left cervical ICA calcified plaque with less than 50% stenosis. Vertebral arteries: Stable, and notable for mild to moderate right vertebral artery origin stenosis due to calcified plaque. CTA HEAD Posterior circulation: Stable and negative. Mildly dominant left V4 segment. Both posterior  communicating arteries are small but present. Anterior  circulation: Both ICA siphons remain patent. Calcified siphon atherosclerosis is stable, mild to moderate and not associated with significant siphon stenosis. Normal posterior communicating artery and ophthalmic artery origins. Patent carotid termini, MCA and ACA origins. Dominant left A1 as before. 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 Left MCA M1 and left MCA branches are stable and within normal limits. Right MCA M1 and right MCA bifurcation remain patent without stenosis. Posterior right M2 stent has been placed traversing the embolized calcification demonstrated in February. The stent appears patent and enhancing (series 15, image 10). Although posterior right MCA division branches distal to the stent remain diminutive or absent. Other right MCA branches appear stable. Venous sinuses: Patent. Anatomic variants: Dominant left ACA A1. Review of the MIP images confirms the above findings IMPRESSION: 1. Negative for emergent large vessel occlusion. 2. Stented posterior Right MCA branch where the embolized calcification was noted in February. The stent is patent. And other MCA branches remain stable. 3. 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 Electronically Signed   By: Genevie Ann M.D.   On: 11/18/2020 10:09    Procedures Procedures   Medications Ordered in ED Medications  iohexol (OMNIPAQUE) 350 MG/ML injection 75 mL (75 mLs Intravenous Contrast Given 11/18/20 6063)    ED Course  I have reviewed the triage vital signs and the nursing notes.  Pertinent labs & imaging results that were available during my care of the patient were reviewed by me and considered in my  medical decision making (see chart for details). I spoke to neurology initially and it was decided to call code stroke.  Neurology saw the patient and after CT back and evaluation they decided the patient needed an MRI and be admitted for stroke work-up   CRITICAL CARE Performed by: Milton Ferguson Total critical care time: 40 minutes Critical care time was exclusive of separately billable procedures and treating other patients. Critical care was necessary to treat or prevent imminent or life-threatening deterioration. Critical care was time spent personally by me on the following activities: development of treatment plan with patient and/or surrogate as well as nursing, discussions with consultants, evaluation of patient's response to treatment, examination of patient, obtaining history from patient or surrogate, ordering and performing treatments and interventions, ordering and review of laboratory studies, ordering and review of radiographic studies, pulse oximetry and re-evaluation of patient's condition.  MDM Rules/Calculators/A&P                          Weakness TIA or stroke.  Patient will be admitted to medicine with neurology consult Final Clinical Impression(s) / ED Diagnoses Final diagnoses:  None    Rx / DC Orders ED Discharge Orders    None       Milton Ferguson, MD 11/18/20 1252

## 2020-11-18 NOTE — ED Notes (Signed)
Patient transported to MRI 

## 2020-11-18 NOTE — ED Triage Notes (Addendum)
Patient presents to ed via [facility] for c/o being  Weak, states he had a stroke in [place] and has left sided weakness. Was seen by PCP last week and had chest xray poss pneumonia no medication given. C/o productive cough. Patient states he currently feels fine.

## 2020-11-18 NOTE — Progress Notes (Signed)
PT Cancellation Note  Patient Details Name: Frank [name] MRN: [id number] DOB: 09/12/1940   Cancelled Treatment:    Reason Eval/Treat Not Completed: Patient at procedure or test/unavailable OTF and unavailable for PT. Will attempt to try back later as time/schedule allow.    Windell Norfolk, DPT, PN1   Supplemental Physical Therapist [facility]    Pager [phone] Acute Rehab Office [phone]

## 2020-11-18 NOTE — ED Provider Notes (Signed)
[facility] DEPARTMENT Provider Note   CSN: [id number] Arrival date & time: 11/18/20  8101     History Chief Complaint  Patient presents with  . Weakness    Frank [name] is a 80 y.o. male.  Patient stated he went to bed last night around midnight he has a history of a stroke and has been able to ambulate with a walker.  When he got up this morning he was unable to ambulate and had significant weakness to the left leg.  Patient had stenting from her stroke beginning of March.  I spoke with the neurologist and it was felt we should just go ahead and call a code stroke `   Weakness Associated symptoms: no abdominal pain, no chest pain, no cough, no diarrhea, no frequency, no headaches and no seizures        Past Medical History:  Diagnosis Date  . Allergy    Rhinitis  . Anemia    NOS iron deficient and B12 deficient  . Arthritis   . 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 Diabetes mellitus    Type 2  . GERD (gastroesophageal reflux disease)   . Hyperlipidemia   . Hypertension   . 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 OSA (obstructive sleep apnea)    cpap  . TBI (traumatic brain injury) [facility])     Patient Active Problem List   Diagnosis Date Noted  . Simple chronic bronchitis ([place]) 11/13/2020  . Iron deficiency anemia secondary to inadequate dietary iron intake 11/06/2020  . DNR (do not resuscitate) discussion 10/17/2020  . Stroke (cerebrum) ([place]) 09/09/2020  . 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 Chronic renal disease, stage 3, moderately decreased glomerular filtration rate (GFR) between 30-59 mL/min/1.73 square meter ([place]) 11/21/2018  . GERD without esophagitis 07/22/2017  . Depression   . Type 2 diabetes mellitus with peripheral neuropathy ([name])   . OAB (overactive bladder)  11/26/2014  . Routine general medical examination at a health care facility 04/06/2014  . Vitamin D deficiency 07/23/2010  . Obstructive sleep apnea 09/13/2008  . Hyperlipidemia with target LDL less than 100 07/27/2007  . BPH (benign prostatic hyperplasia) 06/21/2007  . 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 B12 deficiency anemia 04/15/2006  . Essential hypertension 04/15/2006  . Allergic rhinitis 04/15/2006    Past Surgical History:  Procedure Laterality Date  . APPENDECTOMY    . arm fracture Left 2011   with hardware  . BURR HOLE Bilateral 08/04/2017   Procedure: Haskell Flirt;  Surgeon: [name], MD;  Location: [place];  Service: Neurosurgery;  Laterality: Bilateral;  . COLONOSCOPY    . CRANIOTOMY Left 12/01/2017   Procedure: Trudee Kuster Holes CRANIOTOMY HEMATOMA EVACUATION SUBDURAL;  Surgeon: [name], MD;  Location: [place];  Service: Neurosurgery;  Laterality: Left;  . ESOPHAGOGASTRODUODENOSCOPY  2006   gastritis  . IR PERCUTANEOUS ART THROMBECTOMY/INFUSION INTRACRANIAL INC DIAG ANGIO  09/09/2020  . PACEMAKER IMPLANT N/A 05/14/2020   Procedure: PACEMAKER IMPLANT;  Surgeon: [name], MD;  Location: [place] [facility];  Service: Cardiovascular;  Laterality: N/A;  . RADIOLOGY WITH ANESTHESIA N/A 09/09/2020   Procedure: IR WITH ANESTHESIA;  Surgeon: Radiologist, Medication, MD;  Location: [place];  Service: Radiology;  Laterality: N/A;  . ROTATOR CUFF REPAIR    . SEPTOPLASTY  1959   Deviated Septum  .  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 Heart disease Mother   . Breast cancer Mother   . Stroke Father   . Allergies Father        Father and children  . Coronary artery disease Brother   . Diabetes Neg Hx   . Colon cancer Neg Hx     Social History   Tobacco Use  . Smoking status: Former Smoker    Packs/day: 1.00    Quit date: 07/14/1983    Years since quitting: 37.3  . Smokeless tobacco: Never Used  Vaping Use  .  Vaping Use: Never used  Substance Use Topics  . Alcohol use: Yes    Alcohol/week: 2.0 standard drinks    Types: 2 Standard drinks or equivalent per week    Comment: occassionally  . Drug use: No    Home Medications Prior to Admission medications   Medication Sig Start Date End Date Taking? Authorizing Provider  amLODipine (NORVASC) 10 MG tablet Take 1 tablet (10 mg total) by mouth daily. 09/18/20   Briant Cedar, MD  aspirin 81 MG chewable tablet Chew 1 tablet (81 mg total) by mouth daily. 09/18/20   Briant Cedar, MD  atorvastatin (LIPITOR) 10 MG tablet Take 1 tablet (10 mg total) by mouth daily. 09/18/20   Briant Cedar, MD  azelastine (ASTELIN) 0.1 % nasal spray Place 1 spray into both nostrils 2 (two) times daily. Use in each nostril as directed 11/27/19   Janith Lima, MD  cloNIDine (CATAPRES - DOSED IN MG/24 HR) 0.1 mg/24hr patch Place 1 patch (0.1 mg total) onto the skin once a week. 11/13/20   Janith Lima, MD  Ferric Maltol (ACCRUFER) 30 MG CAPS Take 1 capsule by mouth in the morning and at bedtime. 11/06/20   Janith Lima, MD  FLUoxetine (PROZAC) 20 MG capsule TAKE 1 CAPSULE BY MOUTH  DAILY 09/25/20   Janith Lima, MD  glipiZIDE (GLUCOTROL XL) 10 MG 24 hr tablet TAKE 1 TABLET BY MOUTH  DAILY 09/25/20   Janith Lima, MD  Glucagon (GVOKE HYPOPEN 2-PACK) 1 MG/0.2ML SOAJ Inject 1 Act into the skin daily as needed. Patient taking differently: Inject 1 each into the skin as needed (low blood sugar). 04/30/20   Janith Lima, MD  HYDROcodone bit-homatropine (HYCODAN) 5-1.5 MG/5ML syrup Take 5 mLs by mouth every 8 (eight) hours as needed for cough. 11/13/20   Janith Lima, MD  insulin glargine, 2 Unit Dial, (TOUJEO MAX SOLOSTAR) 300 UNIT/ML Solostar Pen Inject 10 Units into the skin daily. 04/16/20   Janith Lima, MD  Insulin Pen Needle 32G X 6 MM MISC 1 Act by Does not apply route daily. 04/08/20   Janith Lima, MD  levETIRAcetam (KEPPRA) 100 MG/ML  solution Take 5 mLs (500 mg total) by mouth 2 (two) times daily. 11/14/20   Janith Lima, MD  levETIRAcetam (KEPPRA) 500 MG tablet Take 500 mg by mouth 2 (two) times daily. 10/16/20   [provider]  loratadine (CLARITIN) 10 MG tablet Take 1 tablet (10 mg total) by mouth every evening. 09/18/20   Briant Cedar, MD  pantoprazole (PROTONIX) 40 MG tablet TAKE 1 TABLET BY MOUTH  DAILY 10/03/20   Janith Lima, MD  PRESCRIPTION MEDICATION CPAP- At bedtime    [provider]  solifenacin (VESICARE) 10 MG tablet Take 1 tablet (10 mg  total) by mouth daily. 11/14/20   Janith Lima, MD  ticagrelor (BRILINTA) 90 MG TABS tablet Take 0.5 tablets (45 mg total) by mouth 2 (two) times daily. 11/14/20   Janith Lima, MD  triamcinolone cream (KENALOG) 0.1 % Apply 1 application topically daily as needed (to affected areas).  07/26/19   [provider]    Allergies    Lisinopril, Invokana [canagliflozin], Penicillins, and Sulfamethoxazole  Review of Systems   Review of Systems  Constitutional: Negative for appetite change and fatigue.  HENT: Negative for congestion, ear discharge and sinus pressure.   Eyes: Negative for discharge.  Respiratory: Negative for cough.   Cardiovascular: Negative for chest pain.  Gastrointestinal: Negative for abdominal pain and diarrhea.  Genitourinary: Negative for frequency and hematuria.  Musculoskeletal: Negative for back pain.  Skin: Negative for rash.  Neurological: Positive for weakness. Negative for seizures and headaches.  Psychiatric/Behavioral: Negative for hallucinations.    Physical Exam Updated Vital Signs BP (!) 142/73 (BP Location: Right Arm)   Pulse 90   Temp 97.9 F (36.6 C) (Oral)   Resp 20   Ht 5\' 9"  (1.753 m)   Wt 93 kg   SpO2 91%   BMI 30.27 kg/m   Physical Exam Vitals and nursing note reviewed.  Constitutional:      Appearance: He is well-developed.  HENT:     Head: Normocephalic.     Nose: Nose normal.   Eyes:     General: No scleral icterus.    Conjunctiva/sclera: Conjunctivae normal.  Neck:     Thyroid: No thyromegaly.  Cardiovascular:     Rate and Rhythm: Normal rate and regular rhythm.     Heart sounds: No murmur heard. No friction rub. No gallop.   Pulmonary:     Breath sounds: No stridor. No wheezing or rales.  Chest:     Chest wall: No tenderness.  Abdominal:     General: There is no distension.     Tenderness: There is no abdominal tenderness. There is no rebound.  Musculoskeletal:        General: Normal range of motion.     Cervical back: Neck supple.     Comments: Weakness in left leg.  Patient unable to ambulate  Lymphadenopathy:     Cervical: No cervical adenopathy.  Skin:    Findings: No erythema or rash.  Neurological:     Mental Status: He is alert and oriented to person, place, and time.     Motor: No abnormal muscle tone.     Coordination: Coordination normal.  Psychiatric:        Behavior: Behavior normal.     ED Results / Procedures / Treatments   Labs (all labs ordered are listed, but only abnormal results are displayed) Labs Reviewed  RESP PANEL BY RT-PCR (FLU A&B, COVID) ARPGX2  ETHANOL  PROTIME-INR  APTT  CBC  DIFFERENTIAL  COMPREHENSIVE METABOLIC PANEL  RAPID URINE DRUG SCREEN, HOSP PERFORMED  URINALYSIS, ROUTINE W REFLEX MICROSCOPIC  I-STAT CHEM 8, ED    EKG None  Radiology No results found.  Procedures Procedures   Medications Ordered in ED Medications - No data to display  ED Course  I have reviewed the triage vital signs and the nursing notes.  Pertinent labs & imaging results that were available during my care of the patient were reviewed by me and considered in my medical decision making (see chart for details).    CRITICAL CARE Performed by: Milton Ferguson Total critical  care time: 40 minutes Critical care time was exclusive of separately billable procedures and treating other patients. Critical care was necessary  to treat or prevent imminent or life-threatening deterioration. Critical care was time spent personally by me on the following activities: development of treatment plan with patient and/or surrogate as well as nursing, discussions with consultants, evaluation of patient's response to treatment, examination of patient, obtaining history from patient or surrogate, ordering and performing treatments and interventions, ordering and review of laboratory studies, ordering and review of radiographic studies, pulse oximetry and re-evaluation of patient's condition.  MDM Rules/Calculators/A&P                         Patient with weakness in left leg.  Patient seen by neurology and MRI was negative.  Patient will be admitted by medicine with neuro consult Final Clinical Impression(s) / ED Diagnoses Final diagnoses:  None    Rx / DC Orders ED Discharge Orders    None       Milton Ferguson, MD 11/19/20 [id number]

## 2020-11-18 NOTE — Code Documentation (Signed)
Patient was LKW before midnight on 5/8 when going to bed. Per pt's wife, pt was found at 0400 this am walking around the hallway and hallucinating. He went back to bed and at 0700 woke up with left-sided weakness and had a fall. Family stated that patient has had some memory issues the past few weeks. He typically ambulates with a walker. Pt was [facility] to [facility] and assessed for weakness by the EDP. After consulting Neurology a code stroke was activated since patient has a history of stenosis. Pt was taken for CT/CTA to rule out any blood flow issues. Once CT/CTA resulted and were negative (per MD) pt was taken back to room. Pt is outside the window for TPA and no other acute treatment is necessary. Care Plan: q2x12 then q4 vitals/mNIHSS and stroke workup. Hand off with Lysbeth Galas, RN.   "IMPRESSION: 1. No acute cortically based infarct or acute intracranial hemorrhage identified. ASPECTS 10. 2. 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 3. Questionable increased small vessel disease in the posterior limb left internal capsule and left thalamus since March. 4. Unresolved small bilateral mixed density subdural hematomas remain stable."  Servando Kyllonen, Rande Brunt, RN  Stroke Response Nurse

## 2020-11-18 NOTE — H&P (Signed)
History and Physical    Marq Rebello YBO:[id number] DOB: Jun 02, 1941 DOA: 11/18/2020  PCP: Janith Lima, MD (Confirm with patient/family/NH records and if not entered, this has to be entered at [facility] point of entry) Patient coming from: Home  I have personally briefly reviewed patient's old medical records in [place]  Chief Complaint: Confusion, hypoxia and right-sided weakness  HPI: Kross Swallows is a 80 y.o. 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  Patient was hospitalized in February 2022 for left-sided weakness and was found to have a right MCA territory stroke, secondary to occlusion and calcific thrombosis in the right RCA with high-grade stenosis, subsequently patient underwent IR intervention with stenting of M2/3.  Patient was discharged on dual antiplatelet therapy aspirin and Brilinta.  However patient ran out of Brilinta 2 weeks ago, and unable to refill.  This morning, around 4 AM, patient woke up confused and appeared to have nightmare.  The patient went back to sleep and 1 hour later, patient was found in the kitchen by himself and complained about new onset of right-sided weakness " last week has been left side" the patient leaned toward the wall and sliding down slowly.  No loss of consciousness, no head injury.  Patient also reported that after waking up this morning he checked his pulse ox was in the mid 80s.  And family member reported patient had a similar episode of confusion yesterday.  Patient also started to have cough "always sth in the throat, won't clear" since last week, no fever or chills, no sputum production.  ED Course:  Neuro exam symmetrical, CT head and cephalomalacia posterior right hemisphere and resolved hemorrhagic transformation compared to March. CTA, No major heart results occlusion.  Chest x-ray showed bilateral atelectasis.  Review of Systems: As per HPI otherwise 14 point review of systems negative.     Past Medical History:  Diagnosis Date  . Allergy    Rhinitis  . Anemia    NOS iron deficient and B12 deficient  . Arthritis   . 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 Diabetes mellitus    Type 2  . GERD (gastroesophageal reflux disease)   . Hyperlipidemia   . Hypertension   . 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 OSA (obstructive sleep apnea)    cpap  . TBI (traumatic brain injury) [facility])     Past Surgical History:  Procedure Laterality Date  . APPENDECTOMY    . arm fracture Left 2011   with hardware  . BURR HOLE Bilateral 08/04/2017   Procedure: Haskell Flirt;  Surgeon: [name], MD;  Location: [place];  Service: Neurosurgery;  Laterality: Bilateral;  . COLONOSCOPY    . CRANIOTOMY Left 12/01/2017   Procedure: Trudee Kuster Holes CRANIOTOMY HEMATOMA EVACUATION SUBDURAL;  Surgeon: [name], MD;  Location: [place];  Service: Neurosurgery;  Laterality: Left;  . ESOPHAGOGASTRODUODENOSCOPY  2006   gastritis  . IR PERCUTANEOUS ART THROMBECTOMY/INFUSION INTRACRANIAL INC DIAG ANGIO  09/09/2020  . PACEMAKER IMPLANT N/A 05/14/2020   Procedure: PACEMAKER IMPLANT;  Surgeon: [name], MD;  Location: [facility];  Service: Cardiovascular;  Laterality: N/A;  . RADIOLOGY WITH ANESTHESIA N/A 09/09/2020   Procedure: IR WITH  ANESTHESIA;  Surgeon: Radiologist, Medication, MD;  Location: [place];  Service: Radiology;  Laterality: N/A;  . ROTATOR CUFF REPAIR    . SEPTOPLASTY  1959   Deviated Septum  . 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 He smoked 1.00 pack per day. He has never used smokeless tobacco. He reports current alcohol use of about 2.0  standard drinks of alcohol per week. He reports that he does not use drugs.  Allergies  Allergen Reactions  . Lisinopril Cough  . Invokana [Canagliflozin] Other (See Comments)    Stopped by MD  . Penicillins Other (See Comments)    Allergic as a child.  Patient does not remember reaction.  Has had cephalasporins without problems.  . Sulfamethoxazole Other (See Comments)    Unknown reaction- from childhood    Family History  Problem Relation Age of Onset  . Heart disease Mother   . Breast cancer Mother   . Stroke Father   . Allergies Father        Father and children  . Coronary artery disease Brother   . Diabetes Neg Hx   . Colon cancer Neg Hx      Prior to Admission medications   Medication Sig Start Date End Date Taking? Authorizing Provider  amLODipine (NORVASC) 10 MG tablet Take 1 tablet (10 mg total) by mouth daily. 09/18/20   Briant Cedar, MD  aspirin 81 MG chewable tablet Chew 1 tablet (81 mg total) by mouth daily. 09/18/20   Briant Cedar, MD  atorvastatin (LIPITOR) 10 MG tablet Take 1 tablet (10 mg total) by mouth daily. 09/18/20   Briant Cedar, MD  azelastine (ASTELIN) 0.1 % nasal spray Place 1 spray into both nostrils 2 (two) times daily. Use in each nostril as directed 11/27/19   Janith Lima, MD  cloNIDine (CATAPRES - DOSED IN MG/24 HR) 0.1 mg/24hr patch Place 1 patch (0.1 mg total) onto the skin once a week. 11/13/20   Janith Lima, MD  Ferric Maltol (ACCRUFER) 30 MG CAPS Take 1 capsule by mouth in the morning and at bedtime. 11/06/20   Janith Lima, MD  FLUoxetine (PROZAC) 20 MG capsule TAKE 1 CAPSULE BY MOUTH  DAILY 09/25/20   Janith Lima, MD  glipiZIDE (GLUCOTROL XL) 10 MG 24 hr tablet TAKE 1 TABLET BY MOUTH  DAILY 09/25/20   Janith Lima, MD  Glucagon (GVOKE HYPOPEN 2-PACK) 1 MG/0.2ML SOAJ Inject 1 Act into the skin daily as needed. Patient taking differently: Inject 1 each into the skin as needed (low blood sugar). 04/30/20    Janith Lima, MD  HYDROcodone bit-homatropine (HYCODAN) 5-1.5 MG/5ML syrup Take 5 mLs by mouth every 8 (eight) hours as needed for cough. 11/13/20   Janith Lima, MD  insulin glargine, 2 Unit Dial, (TOUJEO MAX SOLOSTAR) 300 UNIT/ML Solostar Pen Inject 10 Units into the skin daily. 04/16/20   Janith Lima, MD  Insulin Pen Needle 32G X 6 MM MISC 1 Act by Does not apply route daily. 04/08/20   Janith Lima, MD  levETIRAcetam (KEPPRA) 100 MG/ML solution Take 5 mLs (500 mg total) by mouth 2 (two) times daily. 11/14/20   Janith Lima, MD  levETIRAcetam (KEPPRA) 500 MG tablet Take 500 mg by mouth 2 (two) times daily. 10/16/20   [provider]  loratadine (CLARITIN) 10 MG tablet Take 1 tablet (10 mg total) by mouth  every evening. 09/18/20   Briant Cedar, MD  pantoprazole (PROTONIX) 40 MG tablet TAKE 1 TABLET BY MOUTH  DAILY 10/03/20   Janith Lima, MD  PRESCRIPTION MEDICATION CPAP- At bedtime    [provider]  solifenacin (VESICARE) 10 MG tablet Take 1 tablet (10 mg total) by mouth daily. 11/14/20   Janith Lima, MD  ticagrelor (BRILINTA) 90 MG TABS tablet Take 0.5 tablets (45 mg total) by mouth 2 (two) times daily. 11/14/20   Janith Lima, MD  triamcinolone cream (KENALOG) 0.1 % Apply 1 application topically daily as needed (to affected areas).  07/26/19   [provider]    Physical Exam: Vitals:   11/18/20 0945 11/18/20 1000 11/18/20 1100 11/18/20 1200  BP:  (!) 152/72 (!) 174/73 132/78  Pulse: (!) 102 98 (!) 107 90  Resp: (!) 23 19 (!) 28 (!) 22  Temp:      TempSrc:      SpO2: 92% 91% 92% 93%  Weight:      Height:        Constitutional: NAD, calm, comfortable Vitals:   11/18/20 0945 11/18/20 1000 11/18/20 1100 11/18/20 1200  BP:  (!) 152/72 (!) 174/73 132/78  Pulse: (!) 102 98 (!) 107 90  Resp: (!) 23 19 (!) 28 (!) 22  Temp:      TempSrc:      SpO2: 92% 91% 92% 93%  Weight:      Height:       Eyes: PERRL, lids and conjunctivae  normal ENMT: Mucous membranes are moist. Posterior pharynx clear of any exudate or lesions.Normal dentition.  Neck: normal, supple, no masses, no thyromegaly Respiratory: Diminished breathing sound bilateral lower fields, no wheezing, no crackles. Normal respiratory effort. No accessory muscle use.  Cardiovascular: Regular rate and rhythm, no murmurs / rubs / gallops. No extremity edema. 2+ pedal pulses. No carotid bruits.  Abdomen: no tenderness, no masses palpated. No hepatosplenomegaly. Bowel sounds positive.  Musculoskeletal: no clubbing / cyanosis. No joint deformity upper and lower extremities. Good ROM, no contractures. Normal muscle tone.  Skin: no rashes, lesions, ulcers. No induration Neurologic: CN 2-12 grossly intact. Sensation intact, DTR normal. Strength 5/5 in all 4.  Psychiatric: Normal judgment and insight. Alert and oriented x 3. Normal mood.     Labs on Admission: I have personally reviewed following labs and imaging studies  CBC: Recent Labs  Lab 11/18/20 0902 11/18/20 1002  WBC 8.1  --   NEUTROABS 6.0  --   HGB 10.6* 9.9*  HCT 34.5* 29.0*  MCV 85.6  --   PLT 193  --    Basic Metabolic Panel: Recent Labs  Lab 11/18/20 0902 11/18/20 1002  NA 136 135  K 4.1 4.1  CL 101 100  CO2 27  --   GLUCOSE 235* 213*  BUN 30* 31*  CREATININE 1.81* 1.60*  CALCIUM 9.1  --    GFR: Estimated Creatinine Clearance: 40.1 mL/min (A) (by C-G formula based on SCr of 1.6 mg/dL (H)). Liver Function Tests: Recent Labs  Lab 11/18/20 0902  AST 18  ALT 15  ALKPHOS 111  BILITOT 0.8  PROT 6.7  ALBUMIN 3.3*   No results for input(s): LIPASE, AMYLASE in the last 168 hours. No results for input(s): AMMONIA in the last 168 hours. Coagulation Profile: Recent Labs  Lab 11/18/20 0945  INR 1.2   Cardiac Enzymes: No results for input(s): CKTOTAL, CKMB, CKMBINDEX, TROPONINI in the last 168 hours. BNP (last 3  results) No results for input(s): PROBNP in the last 8760  hours. HbA1C: No results for input(s): HGBA1C in the last 72 hours. CBG: No results for input(s): GLUCAP in the last 168 hours. Lipid Profile: No results for input(s): CHOL, HDL, LDLCALC, TRIG, CHOLHDL, LDLDIRECT in the last 72 hours. Thyroid Function Tests: No results for input(s): TSH, T4TOTAL, FREET4, T3FREE, THYROIDAB in the last 72 hours. Anemia Panel: No results for input(s): VITAMINB12, FOLATE, FERRITIN, TIBC, IRON, RETICCTPCT in the last 72 hours. Urine analysis:    Component Value Date/Time   COLORURINE YELLOW 11/18/2020 1137   APPEARANCEUR CLOUDY (A) 11/18/2020 1137   LABSPEC 1.028 11/18/2020 1137   PHURINE 6.0 11/18/2020 1137   GLUCOSEU 50 (A) 11/18/2020 1137   GLUCOSEU NEGATIVE 08/21/2020 0945   HGBUR SMALL (A) 11/18/2020 1137   BILIRUBINUR NEGATIVE 11/18/2020 1137   BILIRUBINUR n 07/19/2013 1136   KETONESUR 5 (A) 11/18/2020 1137   PROTEINUR 30 (A) 11/18/2020 1137   UROBILINOGEN 0.2 08/21/2020 0945   NITRITE NEGATIVE 11/18/2020 1137   LEUKOCYTESUR TRACE (A) 11/18/2020 1137    Radiological Exams on Admission: DG Chest 2 View  Result Date: 11/18/2020 CLINICAL DATA:  Cough EXAM: CHEST - 2 VIEW COMPARISON:  November 05, 2020 FINDINGS: There is atelectatic change in the lung bases. No edema or airspace opacity. Pacemaker present with lead tips in right atrium and right ventricle regions. Heart size and pulmonary vascularity are normal. No adenopathy. There is aortic atherosclerosis. There is calcification in the left carotid artery. There is degenerative change in the thoracic spine. Postoperative change noted in right shoulder. Evidence of prior trauma lateral right clavicle. IMPRESSION: Bibasilar atelectasis. No edema or airspace opacity. Heart size within normal limits. Pacemaker lead positions unchanged. Aortic Atherosclerosis (ICD10-I70.0). Electronically Signed   By: Lowella Grip III M.D.   On: 11/18/2020 13:20   CT HEAD CODE STROKE WO CONTRAST  Result Date:  11/18/2020 CLINICAL DATA:  Code stroke. 80 year old male last known well at midnight. Right MCA infarct with hemorrhagic transformation in March. Right MCA M2 stent. History of unresolved subdural hematomas. EXAM: CT HEAD WITHOUT CONTRAST TECHNIQUE: Contiguous axial images were obtained from the base of the skull through the vertex without intravenous contrast. COMPARISON:  Head CT 09/18/2020 and earlier. FINDINGS: Brain: Mixed density bilateral subdural hematomas are stable since March, measuring up to 8-9 mm in thickness bilaterally and most pronounced over the posterior convexities. Developing encephalomalacia throughout the posterior right MCA territory with resolved hyperdense hemorrhage and mass effect since March. Hypodensity in the posterior left corona radiata, posterior left internal capsule and medial left thalamus appears increased since March. Gray-white matter differentiation elsewhere appears stable. No acute cortically based infarct identified. Vascular: Mild Calcified atherosclerosis at the skull base. Right MCA branch stent in the sylvian fissure appears stable. Skull: Previous left superior convexity craniotomy, bilateral burr holes. No acute osseous abnormality identified. Sinuses/Orbits: Mildly improved ethmoid sinus mucosal thickening. Other Visualized paranasal sinuses and mastoids are stable and well aerated. Other: No acute orbit or scalp soft tissue findings. ASPECTS [facility] Stroke Program Early CT Score) Total score (0-10 with 10 being normal): 10 IMPRESSION: 1. No acute cortically based infarct or acute intracranial hemorrhage identified. ASPECTS 10. 2. 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 3. Questionable increased small vessel disease in the posterior limb left internal capsule and left thalamus since March. 4. Unresolved small bilateral mixed density subdural hematomas remain stable. Study discussed by  telephone with Dr. Alferd Patee  [email or web address] on 11/18/2020 at 09:28 . Electronically Signed   By: Genevie Ann M.D.   On: 11/18/2020 09:30   CT ANGIO HEAD CODE STROKE  Result Date: 11/18/2020 CLINICAL DATA:  Code stroke. 80 year old male last known well at midnight. Right MCA infarct with hemorrhagic transformation in March. Right MCA M2 stent. History of unresolved subdural hematomas. EXAM: CT ANGIOGRAPHY HEAD AND NECK TECHNIQUE: Multidetector CT imaging of the head and neck was performed using the standard protocol during bolus administration of intravenous contrast. Multiplanar CT image reconstructions and MIPs were obtained to evaluate the vascular anatomy. Carotid stenosis measurements (when applicable) are obtained utilizing NASCET criteria, using the distal internal carotid diameter as the denominator. CONTRAST:  50mL OMNIPAQUE IOHEXOL 350 MG/ML SOLN COMPARISON:  Plain head CT 0919 hours today. CTA head and neck and CTP 09/09/20. FINDINGS: CTA NECK Skeleton: Stable.  No acute osseous abnormality identified. Upper chest: Left chest cardiac pacemaker redemonstrated. Patchy nonspecific upper lung opacity has not significantly changed since February and may be scarring. No superior mediastinal lymphadenopathy. Visible central pulmonary arteries appear patent. Other neck: Stable.  No acute finding. Aortic arch: Stable mild calcified arch atherosclerosis. Three vessel arch configuration. Right carotid system: Stable right CCA plaque without significant stenosis proximal to the bifurcation. Bulky calcified plaque at the right ICA origin and bulb resulting in radiographic string sign stenosis appears stable on series 5, image 104. The right ICA remains patent to the skull base. Left carotid system: Stable left CCA, left carotid bifurcation, proximal left ICA, and mild distal left cervical ICA calcified plaque with less than 50% stenosis. Vertebral arteries: Stable, and notable for mild to moderate right vertebral artery origin  stenosis due to calcified plaque. CTA HEAD Posterior circulation: Stable and negative. Mildly dominant left V4 segment. Both posterior communicating arteries are small but present. Anterior circulation: Both ICA siphons remain patent. Calcified siphon atherosclerosis is stable, mild to moderate and not associated with significant siphon stenosis. Normal posterior communicating artery and ophthalmic artery origins. Patent carotid termini, MCA and ACA origins. Dominant left A1 as before. 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 Left MCA M1 and left MCA branches are stable and within normal limits. Right MCA M1 and right MCA bifurcation remain patent without stenosis. Posterior right M2 stent has been placed traversing the embolized calcification demonstrated in February. The stent appears patent and enhancing (series 15, image 10). Although posterior right MCA division branches distal to the stent remain diminutive or absent. Other right MCA branches appear stable. Venous sinuses: Patent. Anatomic variants: Dominant left ACA A1. Review of the MIP images confirms the above findings IMPRESSION: 1. Negative for emergent large vessel occlusion. 2. Stented posterior Right MCA branch where the embolized calcification was noted in February. The stent is patent. And other MCA branches remain stable. 3. 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 Electronically Signed   By: Genevie Ann M.D.   On: 11/18/2020 10:09   CT ANGIO NECK CODE STROKE  Result Date: 11/18/2020 CLINICAL DATA:  Code stroke. 80 year old male last known well at midnight. Right MCA infarct with hemorrhagic transformation in March. Right MCA M2 stent. History of unresolved subdural  hematomas. EXAM: CT ANGIOGRAPHY HEAD AND NECK TECHNIQUE: Multidetector CT imaging of the head and neck was performed using the  standard protocol during bolus administration of intravenous contrast. Multiplanar CT image reconstructions and MIPs were obtained to evaluate the vascular anatomy. Carotid stenosis measurements (when applicable) are obtained utilizing NASCET criteria, using the distal internal carotid diameter as the denominator. CONTRAST:  26mL OMNIPAQUE IOHEXOL 350 MG/ML SOLN COMPARISON:  Plain head CT 0919 hours today. CTA head and neck and CTP 09/09/20. FINDINGS: CTA NECK Skeleton: Stable.  No acute osseous abnormality identified. Upper chest: Left chest cardiac pacemaker redemonstrated. Patchy nonspecific upper lung opacity has not significantly changed since February and may be scarring. No superior mediastinal lymphadenopathy. Visible central pulmonary arteries appear patent. Other neck: Stable.  No acute finding. Aortic arch: Stable mild calcified arch atherosclerosis. Three vessel arch configuration. Right carotid system: Stable right CCA plaque without significant stenosis proximal to the bifurcation. Bulky calcified plaque at the right ICA origin and bulb resulting in radiographic string sign stenosis appears stable on series 5, image 104. The right ICA remains patent to the skull base. Left carotid system: Stable left CCA, left carotid bifurcation, proximal left ICA, and mild distal left cervical ICA calcified plaque with less than 50% stenosis. Vertebral arteries: Stable, and notable for mild to moderate right vertebral artery origin stenosis due to calcified plaque. CTA HEAD Posterior circulation: Stable and negative. Mildly dominant left V4 segment. Both posterior communicating arteries are small but present. Anterior circulation: Both ICA siphons remain patent. Calcified siphon atherosclerosis is stable, mild to moderate and not associated with significant siphon stenosis. Normal posterior  communicating artery and ophthalmic artery origins. Patent carotid termini, MCA and ACA origins. Dominant left A1 as before. 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 Left MCA M1 and left MCA branches are stable and within normal limits. Right MCA M1 and right MCA bifurcation remain patent without stenosis. Posterior right M2 stent has been placed traversing the embolized calcification demonstrated in February. The stent appears patent and enhancing (series 15, image 10). Although posterior right MCA division branches distal to the stent remain diminutive or absent. Other right MCA branches appear stable. Venous sinuses: Patent. Anatomic variants: Dominant left ACA A1. Review of the MIP images confirms the above findings IMPRESSION: 1. Negative for emergent large vessel occlusion. 2. Stented posterior Right MCA branch where the embolized calcification was noted in February. The stent is patent. And other MCA branches remain stable. 3. 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 Electronically Signed   By: Genevie Ann M.D.   On: 11/18/2020 10:09    EKG: Independently reviewed. Sinus, chronic RBBB  Assessment/Plan Active Problems:   Stroke (cerebrum) ([name])  (please populate well all problems here in Problem List. (For example, if patient is on BP meds at home and you resume or decide to hold them, it is a problem that needs to be her. Same for CAD, COPD, HLD and so on  TIA -With new onset of right sided weakness. -MRI pending -Resume ASA and Brilinta regimen -Allow permissive HTN, but will keep Clonidine to avoid rebound HTN.  AMS -Suspect related to the TIA, which has resolved.  Transient hypoxia -Chest xray showed B/L  atelectasis, ordered incentive spirometry -There was a question about chronic aspiration.  Speech evaluation.  Strict NPO.  Question of silent aspiration -As above.  CKD stage II -Unfortunately patient received  IV contrast today, start maintenance IV fluid for kidney protection.  Repeat CMP tomorrow.  IDDM -Keep long acting insulin and add sliding scale.  OSA -CPAP  DVT prophylaxis: Heparin subQ Code Status: DNR Family Communication: Wife at bedside Disposition Plan: Expect more than 2  Consults called:Neuro Admission status: Tele admit   Lequita Halt MD [facility] [phone]  11/18/2020, 1:49 PM

## 2020-11-18 NOTE — Evaluation (Signed)
Physical Therapy Evaluation Patient Details Name: Frank [name] MRN: [id number] DOB: Jun 01, 1941 Today's Date: 11/18/2020   History of Present Illness  80yo male admitted 11/18/20 presenting with c/o L LE weakness as well as multiple falls at home, confusion. Per neurology w/u, concern for TIA vs recrudescence of past CVA. 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 Needed heavy levels of physical assist to get to/from EOB and stand at bedside- very unsteady and with strong posterior lean in sitting and standing requiring heavy ModA to maintain upright. VSS on RA but with increased WOB. Deferred gait due to balance and safety concerns today. Left on ED stretcher positioned to comfort with all needs met, spouse present. In context of multiple falls at home recently, would likely benefit from rehab in SNF setting prior to return home.     Follow Up Recommendations SNF;Supervision for mobility/OOB    Equipment Recommendations  Hospital bed    Recommendations for Other Services       Precautions / Restrictions Precautions Precautions: Fall;Other (comment) Precaution Comments: recent R MCA CVA/L weakness, posterior LOB, high fall risk Restrictions Weight Bearing Restrictions: No      Mobility  Bed Mobility Overal bed mobility: Needs Assistance Bed Mobility: Supine to Sit;Sit to Supine     Supine to sit: HOB elevated;Mod assist Sit to supine: HOB elevated;Mod assist   General bed mobility comments: ModA to bring BLEs off EOB and scoot hips around to square up at side of stretcher; also heavy modA to manage BLEs when getting back into bed    Transfers Overall transfer level: Needs assistance Equipment used: Rolling walker (2 wheeled) Transfers: Sit to/from Stand Sit to Stand: Mod assist;From elevated surface         General transfer comment: heavy ModA  to boost to standing, even then L LE tending to slide forward out from under him and with strong posterior lean requiring ModA to maintain upright  Ambulation/Gait             General Gait Details: deferred- safety  Stairs            Wheelchair Mobility    Modified Rankin (Stroke Patients Only)       Balance Overall balance assessment: History of Falls;Needs assistance Sitting-balance support: Bilateral upper extremity supported;Feet supported Sitting balance-Leahy Scale: Fair Sitting balance - Comments: posterior bias Postural control: Posterior lean Standing balance support: Bilateral upper extremity supported;During functional activity Standing balance-Leahy Scale: Poor Standing balance comment: ModA to maintain upright, posterior lean                             Pertinent Vitals/Pain Pain Assessment: Faces Faces Pain Scale: No hurt Pain Intervention(s): Limited activity within patient's tolerance;Monitored during session    [place] expects to be discharged to:: Private residence Living Arrangements: Spouse/significant other Available Help at Discharge: Available 24 hours/day;Family;Personal care attendant (gets PCA 8-4 five days a week) Type of Home: House Home Access: Stairs to enter Entrance Stairs-Rails: Can reach both Entrance Stairs-Number of Steps: 4 Home Layout: One level Home Equipment: Walker - 2 wheels;Shower seat;Grab bars - tub/shower;Grab bars - toilet;Cane - single point;Walker - 4 wheels;Wheelchair - manual      Prior Function Level of Independence: Independent with assistive device(s)         Comments: usually  does well with walker, still going to OP PT     Hand Dominance   Dominant Hand: Right    Extremity/Trunk Assessment   Upper Extremity Assessment Upper Extremity Assessment: Defer to OT evaluation    Lower Extremity Assessment Lower Extremity Assessment: Generalized weakness    Cervical  / Trunk Assessment Cervical / Trunk Assessment: Kyphotic  Communication   Communication: No difficulties  Cognition Arousal/Alertness: Awake/alert Behavior During Therapy: WFL for tasks assessed/performed Overall Cognitive Status: Within Functional Limits for tasks assessed                                 General Comments: hx of TBI but seemed to follow cues/commands well; does seem to have reduced awareness of deficits and safety as a whole      General Comments General comments (skin integrity, edema, etc.): VSS on RA    Exercises     Assessment/Plan    PT Assessment Patient needs continued PT services  PT Problem List Decreased strength;Decreased cognition;Decreased knowledge of use of DME;Decreased activity tolerance;Decreased safety awareness;Decreased balance;Decreased mobility;Decreased coordination       PT Treatment Interventions DME instruction;Balance training;Gait training;Stair training;Cognitive remediation;Functional mobility training;Patient/family education;Therapeutic activities;Therapeutic exercise;Wheelchair mobility training;Neuromuscular re-education    PT Goals (Current goals can be found in the Care Plan section)  Acute Rehab PT Goals Patient Stated Goal: go home if able but SNF if needed PT Goal Formulation: With patient/family Time For Goal Achievement: 12/02/20 Potential to Achieve Goals: Fair    Frequency Min 3X/week   Barriers to discharge        Co-evaluation               AM-PAC PT "6 Clicks" Mobility  Outcome Measure Help needed turning from your back to your side while in a flat bed without using bedrails?: A Little Help needed moving from lying on your back to sitting on the side of a flat bed without using bedrails?: A Lot Help needed moving to and from a bed to a chair (including a wheelchair)?: A Lot Help needed standing up from a chair using your arms (e.g., wheelchair or bedside chair)?: A Lot Help needed to  walk in hospital room?: Total Help needed climbing 3-5 steps with a railing? : Total 6 Click Score: 11    End of Session Equipment Utilized During Treatment: Gait belt Activity Tolerance: Patient tolerated treatment well Patient left: in bed;with call bell/phone within reach;with family/visitor present (ED stretcher) Nurse Communication: Mobility status PT Visit Diagnosis: Unsteadiness on feet (R26.81);Difficulty in walking, not elsewhere classified (R26.2);Repeated falls (R29.6);Muscle weakness (generalized) (M62.81)    Time: 1610-9604 PT Time Calculation (min) (ACUTE ONLY): 24 min   Charges:   PT Evaluation $PT Eval Moderate Complexity: 1 Mod PT Treatments $Therapeutic Activity: 8-22 mins       Windell Norfolk, DPT, PN1   Supplemental Physical Therapist [place]    Pager [phone] Acute Rehab Office [phone]

## 2020-11-18 NOTE — Progress Notes (Signed)
[facility] today at [facility] for [facility] brain wo contrast. Patient in located in ED37. Has a medtronic device. [facility] express sent to medtronic reps mike/demi and Renee-cardiology PA. Orders received for DOO 90. ED RN Hassan Rowan to remain and monitor the patient. Will re-program once scan is complete

## 2020-11-18 NOTE — Progress Notes (Signed)
[facility]) Hospital Liaison note:  This is a pending outpatient-based Palliative Care patient. Will continue to follow for disposition.  Please call with any outpatient palliative questions or concerns.  Thank you, Lorelee Market, LPN [facility] Liaison [phone]

## 2020-11-18 NOTE — ED Notes (Signed)
Got patient undressed on the monitor did ekg shown to Dr Roderic Palau patient is resting with call bell in reach

## 2020-11-18 NOTE — Progress Notes (Signed)
Pt stated his wife was gong to bring his CPAP for tonight. PT asked to have RN call RT if anything needed.

## 2020-11-18 NOTE — ED Notes (Signed)
Activated Code Stroke 

## 2020-11-18 NOTE — ED Notes (Signed)
Transported to xray 

## 2020-11-18 NOTE — Consult Note (Addendum)
Neurology consult   CC: code stroke Referring Provider: Milton Ferguson, MD  History is obtained from: patient, daughter, wife, ED MD  HPI: Mr. 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 Patient was here in March 2022 with right MCA territory stroke with revascularization of occluded dominant right MCA inferior division of distal M2/3 with placement of stent and right ICA balloon angioplasty. Patient did have a bleed post IR procedure, but was still continued on ASA 81mg  and Brilinta at 45mg  po bid due to his recent ICA stenting. Note that patient was placed on prophylactic Keppra after TBI, however, was later taken off. But, after stroke in March, he was restarted on it.   Patient presented to ED by EMS today after a fall at home with increased left leg weakness than sequelae of previous stroke. He normally walks with a walker. His MRS is 3. When seen in ED, left leg weakness was noted and subsequently, a code stroke was called. Dr. Milas Gain and NP to ED room and after exam, patient was taken to CT suite for emergent CTA head and neck given his prior angioplasty procedure in March. CTA head and neck did not show any new occlusion. Patient was taken back to ED room with f/up neurological exam with noted already improved left leg weakness. No other focal findings on neurology exam.   Patient with personal history of stroke and FMHx of stroke as well.  In review of chart, patient was a code stroke in March with found Right MCA territory CVA and M2/3 occlusion and was taken to IR.  Post operatively an acute/subacute left sided parenchymal hemorrhage was found. Because of patient's recent stenting of ICA, it was felt prudent to continue his DAPT.   In speaking with family, patient went to bed  around 0000 hours 11/18/20. His wife woke up at 0400 hours and patient was not in bed. Wife found him in the hallway with his walker stating he was trying to get to the kitchen to make coffee. 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 He was also slightly combative. Patient went back to bed and awakened this am around 0700 hours, walked to hallway with walker and fell. Patient stated his leg just "gave out". He does admit, and wife confirms, to slight LLE weakness since prior stroke which left him ambulating with walker.   Also, per wife, he was seen in PCP office recently for a cough and CXR did not confirm PNA, so antibiotics were not started. He received Codeine cough syrup which he last took on 11/17/20 at 0500 hours. Wife states he slid off the bed yesterday and EMS came and got him back in bed. No hospital visit at that time. Wife states that patient has been without his Brilinta x one week due to not being able to get refill from PCP. Patient has had general decline since his stroke in regard to his physical performance and memory.   LKW: midnight 0000 hours. tpa given?: No, patient with previous hemorrhagic conversion of stroke after IR March 2022. IR Thrombectomy?: No, no LVO.  MRS: 3  NIHSS:  1a Level of Consciousness: 0 1b LOC Questions: 0 1c LOC Commands: 0 2 Best Gaze: 0  3 Visual: 0 4 Facial Palsy: 0 5a Motor Arm - left: 0 5b Motor Arm - Right: 0 6a Motor Leg - Left: 0 6b Motor Leg - Right: 0 7 Limb Ataxia: 0 8 Sensory: 0 9 Best Language: 0 10 Dysarthria: 0 11 Extinction and Inattention: 0 TOTAL:  0  ROS: A robust ROS was performed and is negative except as noted in the HPI.   Past Medical History:  Diagnosis Date  . Allergy    Rhinitis  . Anemia    NOS iron deficient and B12 deficient  . Arthritis   . 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 Diabetes mellitus    Type 2  . GERD (gastroesophageal reflux disease)   . Hyperlipidemia    . Hypertension   . 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 OSA (obstructive sleep apnea)    cpap  . TBI (traumatic brain injury) ([place])     Family History  Problem Relation Age of Onset  . Heart disease Mother   . Breast cancer Mother   . Stroke Father   . Allergies Father        Father and children  . Coronary artery disease Brother   . Diabetes Neg Hx   . Colon cancer Neg Hx     Social History:  reports that he quit smoking about 37 years ago. He smoked 1.00 pack per day. He has never used smokeless tobacco. He reports current alcohol use of about 2.0 standard drinks of alcohol per week. He reports that he does not use drugs.   Prior to Admission medications   Medication Sig Start Date End Date Taking? Authorizing Provider  amLODipine (NORVASC) 10 MG tablet Take 1 tablet (10 mg total) by mouth daily. 09/18/20   Briant Cedar, MD  aspirin 81 MG chewable tablet Chew 1 tablet (81 mg total) by mouth daily. 09/18/20   Briant Cedar, MD  atorvastatin (LIPITOR) 10 MG tablet Take 1 tablet (10 mg total) by mouth daily. 09/18/20   Briant Cedar, MD  azelastine (ASTELIN) 0.1 % nasal spray Place 1 spray into both nostrils 2 (two) times daily. Use in each nostril as directed 11/27/19   Janith Lima, MD  cloNIDine (CATAPRES - DOSED IN MG/24 HR) 0.1 mg/24hr patch Place 1 patch (0.1 mg total) onto the skin once a week. 11/13/20   Janith Lima, MD  Ferric Maltol (ACCRUFER) 30 MG CAPS Take 1 capsule by mouth in the morning and at bedtime. 11/06/20   Janith Lima, MD  FLUoxetine (PROZAC) 20 MG capsule TAKE 1 CAPSULE BY MOUTH  DAILY 09/25/20   Janith Lima, MD  glipiZIDE (GLUCOTROL XL) 10 MG 24 hr tablet TAKE 1 TABLET BY MOUTH  DAILY 09/25/20   Janith Lima, MD  Glucagon (GVOKE HYPOPEN 2-PACK) 1 MG/0.2ML SOAJ Inject 1 Act into the skin daily as needed. Patient taking differently: Inject 1 each into the skin as needed (low blood sugar). 04/30/20   Janith Lima, MD   HYDROcodone bit-homatropine (HYCODAN) 5-1.5 MG/5ML syrup Take 5 mLs by mouth every 8 (eight) hours as needed for cough. 11/13/20   Janith Lima, MD  insulin glargine, 2 Unit Dial, (TOUJEO MAX SOLOSTAR) 300 UNIT/ML Solostar Pen Inject 10 Units into the skin daily. 04/16/20   Janith Lima, MD  Insulin Pen Needle 32G X 6 MM MISC 1 Act by Does not apply route daily. 04/08/20   Janith Lima, MD  levETIRAcetam (  KEPPRA) 100 MG/ML solution Take 5 mLs (500 mg total) by mouth 2 (two) times daily. 11/14/20   Janith Lima, MD  levETIRAcetam (KEPPRA) 500 MG tablet Take 500 mg by mouth 2 (two) times daily. 10/16/20   [provider]  loratadine (CLARITIN) 10 MG tablet Take 1 tablet (10 mg total) by mouth every evening. 09/18/20   Briant Cedar, MD  pantoprazole (PROTONIX) 40 MG tablet TAKE 1 TABLET BY MOUTH  DAILY 10/03/20   Janith Lima, MD  PRESCRIPTION MEDICATION CPAP- At bedtime    [provider]  solifenacin (VESICARE) 10 MG tablet Take 1 tablet (10 mg total) by mouth daily. 11/14/20   Janith Lima, MD  ticagrelor (BRILINTA) 90 MG TABS tablet Take 0.5 tablets (45 mg total) by mouth 2 (two) times daily. 11/14/20   Janith Lima, MD  triamcinolone cream (KENALOG) 0.1 % Apply 1 application topically daily as needed (to affected areas).  07/26/19   [provider]    Exam: Current vital signs: BP (!) 163/71   Pulse 97   Temp 97.9 F (36.6 C) (Oral)   Resp (!) 21   Ht 5\' 9"  (1.753 m)   Wt 93 kg   SpO2 95%   BMI 30.27 kg/m   Physical Exam  Constitutional: Appears well-developed and well-nourished.  Psych: Affect appropriate to situation. Calm and cooperative.  Eyes: No scleral injection HENT: No OP obstrucion Head: Normocephalic.  Cardiovascular: Normal rate and regular rhythm.  Respiratory: Effort normal and breath sounds normal to anterior ascultation GI: Soft.  No distension. There is no tenderness.  Skin: WDI  Neuro: Mental Status: Patient is  awake, alert, oriented to person, place, month, year, and situation. Patient is able to give a clear and coherent history. No signs of neglect. Speech/Language:  Speech is clear, fluent without dysarthria or aphasia. Repetition, naming, and comprehension intact.  Cranial Nerves: II: Visual Fields are full. Pupils are equal, round, and sluggish due to prior cataract surgery.  III,IV, VI: EOMI without ptosis or diploplia.  V: Facial sensation is symmetric to light touch in V1, V2, and V3 VII: Facial movement is symmetric.  VIII: hearing is intact to voice. X: Uvula elevates symmetrically. XI: Shoulder shrug is symmetric. XII: tongue is midline without atrophy or fasciculations.  Motor: RUE:  grips  5/5     biceps  5/5     triceps 5 /5 LUE:  grips  5 /5    biceps 5 /5     triceps 5 /5 RLE: knee 5 /5     thigh   5/5     plantar flexion  5/5     dorsiflexion 5/5 LLE: knee  4/5     thigh   4/5      plantar flexion   5/5     dorsiflexion   5/5  Tone is normal. Bulk is normal.  Sensory: Sensation is symmetric to light touch in all fours extremities. Extinction absent to light touch DSS.  Deep Tendon Reflexes: RUE:  brachioradialis 2+   RLE: patella 2+ LUE: brachioradialis  2+ LLE: patella 2+ Plantars: Toes are downgoing bilaterally.  Cerebellar: No ataxia noted with FNF and HKS bilaterally. Finger roll finger tapping without ataxia.   I have reviewed labs in epic and the pertinent results are: LDL 29 09/10/20          TSH    1.479 05/2020      HbA1c 7.8 09/10/20   MD reviewed the  images obtained:  MRI brain ordered  CTA head and neck  1. Negative for emergent large vessel occlusion. 2. Stented posterior Right MCA branch where the embolized calcification was noted in February. The stent is patent. And other MCA branches remain stable. 3. 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 -  Anterior Communicating Artery Aneurysm, 3 mm. - moderate Right Vertebral Artery origin stenosis.  Assessment: 80 yo male with a prior history of right MCA CVA with post op hemorrhagic conversion after IR procedure who presented to ED today with increased LLE weakness over sequelae of prior stroke. He also fell around 0700 hours. LKW 0000 hours. He has been on Brilinta and ASA since his recent ICA stenting which was continued even after noted hemorrhage after prior CVA. Patient was not a candidate for tPA due to being outside the tPA window and prior hemorrhage. Patient was not a candidate for IR due to CTA negative for new LVO. His stroke risk factors are prior stroke, atherosclerosis, HLD, IDDM II, and HTN. His post CTA head and neck exam showed improved LLE weakness, therefore this is likely TIA vs. recrudescence of prior stroke symptoms. Given patient has not had his Brilinta in 7 days, this is definitely in the differential as cause of today's presentation. Interesting to note, patient was on Keppra after his TBI 3 years ago. This was subsequently stopped but restarted in March after CVA. Wife has not given his medicines since yesterday due to his weakness and fear of aspiration. Dose of Keppra was decreased after March CVA due to somnolence. Given his hallucinations with fall this a.m., will recheck routine EEG.   Impression:  1. TIA vs recrudescence of prior stroke symptoms. Presentation likely from missed Brilinta doses x one week.  2. History of prior stroke.  3. Keppra use after TBI 3 years ago with subsequent stopping, but restarted in March after CVA here.  4. Cough with recent PCP visit. Given PNA may lag behind CXR, will recheck film.   Recommendations/Plan: - Medicine admit - MRI brain without contrast. - TTE 09/09/20 showed grade I diastolic dysfunction with preserved EF. Will defer to stroke team to repeat. - Recommend labs: HbA1c will repeat since high in March, TSH since none this  past year. Lipid panel recheck even though LDL was only 29 in March given his vasculopathy with history of atherosclerosis/stenosis in intracranial vessels in March.  - continue home statin.  - Continue Aspirin 81mg  daily. - Continue Brilinta at 45mg  po bid.  - SBP goal -Permissive hypertension first 24 h < 220/110. Hold home medications for now. - Telemetry monitoring for arrhythmia. - bedside Swallow screen. - Stroke education. - PT/OT/SLP consult. - NIHSS as per protocol. - frequent neuro checks.  -rEEG given history of TBI and Keppra use since given his hallucinations and fall this am.   -continue Keppra at home dose.  - Recommend metabolic/infectious workup with UA with UCx, CXR, CK, serum lactate.  Electronically signed by: Clance Boll, MSN, APN-BC, nurse practitioner and by MD. Note/plan to be edited by MD as needed.  Pager: [phone]  NEUROHOSPITALIST ADDENDUM Performed a face to face diagnostic evaluation.   I have reviewed the contents of history and physical exam as documented by PA/ARNP/Resident and agree with above documentation.  I have discussed and formulated the above plan as documented. Edits to the note have been made as needed.  Impression/ Key exam  findings/ Plan: 58M woke up confused disoriented. Went back to bed and got up again and fell. LLE weakness that resolved on repeat exam. CTH, CTA with no new stroke, no LVO but notable for prior R MCA stroke. Suspect TIA(primary diagnosis) vs recrudescence 2/2 infectious/metabolic abnormalities, less likely but seizure is also on differential.  Plan is to do MRI Brain and a TIA workup along with metabolic/infectious screen and a routine EEG.   Donnetta Simpers, MD [facility] [phone]   If 7pm to 7am, please call on call as listed on AMION.

## 2020-11-19 DIAGNOSIS — N3000 Acute cystitis without hematuria: Secondary | ICD-10-CM | POA: Diagnosis not present

## 2020-11-19 LAB — LIPID PANEL
Cholesterol: 104 mg/dL (ref 0–200)
HDL: 33 mg/dL — ABNORMAL LOW (ref >40)
LDL Cholesterol: 53 mg/dL (ref 0–99)
Total CHOL/HDL Ratio: 3.2 RATIO
Triglycerides: 91 mg/dL (ref <150)
VLDL: 18 mg/dL (ref 0–40)

## 2020-11-19 LAB — GLUCOSE, CAPILLARY
Glucose-Capillary: 195 mg/dL — ABNORMAL HIGH (ref 70–99)
Glucose-Capillary: 247 mg/dL — ABNORMAL HIGH (ref 70–99)
Glucose-Capillary: 149 mg/dL — ABNORMAL HIGH (ref 70–99)
Glucose-Capillary: 110 mg/dL — ABNORMAL HIGH (ref 70–99)

## 2020-11-19 LAB — BASIC METABOLIC PANEL
Anion gap: 9 (ref 5–15)
BUN: 30 mg/dL — ABNORMAL HIGH (ref 8–23)
CO2: 28 mmol/L (ref 22–32)
Calcium: 9.2 mg/dL (ref 8.9–10.3)
Chloride: 100 mmol/L (ref 98–111)
Creatinine, Ser: 1.55 mg/dL — ABNORMAL HIGH (ref 0.61–1.24)
GFR, Estimated: 44 mL/min — ABNORMAL LOW (ref >60)
Glucose, Bld: 212 mg/dL — ABNORMAL HIGH (ref 70–99)
Potassium: 4 mmol/L (ref 3.5–5.1)
Sodium: 137 mmol/L (ref 135–145)

## 2020-11-19 LAB — HEMOGLOBIN A1C
Hgb A1c MFr Bld: 7.7 % — ABNORMAL HIGH (ref 4.8–5.6)
Mean Plasma Glucose: 174.29 mg/dL

## 2020-11-19 MED ORDER — SODIUM CHLORIDE 0.9 % IV SOLN: INTRAVENOUS | Status: DC

## 2020-11-19 MED ORDER — SODIUM CHLORIDE 0.9 % IV SOLN
1.0000 g | INTRAVENOUS | Status: DC
  Administered 2020-11-19: 1 g via INTRAVENOUS
  Filled 2020-11-19: qty 10

## 2020-11-19 NOTE — Evaluation (Signed)
Clinical/Bedside Swallow Evaluation Patient Details  Name: Frank [name] MRN: [id number] Date of Birth: [birth year]  Today's Date: 11/19/2020 Time: SLP Start Time (ACUTE ONLY): 1107 SLP Stop Time (ACUTE ONLY): 1134 SLP Time Calculation (min) (ACUTE ONLY): 27 min  Past Medical History:  Past Medical History:  Diagnosis Date  . Allergy    Rhinitis  . Anemia    NOS iron deficient and B12 deficient  . Arthritis   . 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 Diabetes mellitus    Type 2  . GERD (gastroesophageal reflux disease)   . Hyperlipidemia   . Hypertension   . 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 OSA (obstructive sleep apnea)    cpap  . TBI (traumatic brain injury) [facility])    Past Surgical History:  Past Surgical History:  Procedure Laterality Date  . APPENDECTOMY    . arm fracture Left 2011   with hardware  . BURR HOLE Bilateral 08/04/2017   Procedure: Haskell Flirt;  Surgeon: [name], MD;  Location: [facility];  Service: Neurosurgery;  Laterality: Bilateral;  . COLONOSCOPY    . CRANIOTOMY Left 12/01/2017   Procedure: Trudee Kuster Holes CRANIOTOMY HEMATOMA EVACUATION SUBDURAL;  Surgeon: [name], MD;  Location: [facility];  Service: Neurosurgery;  Laterality: Left;  . ESOPHAGOGASTRODUODENOSCOPY  2006   gastritis  . IR PERCUTANEOUS ART THROMBECTOMY/INFUSION INTRACRANIAL INC DIAG ANGIO  09/09/2020  . 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 RADIOLOGY WITH ANESTHESIA N/A 09/09/2020   Procedure: IR WITH ANESTHESIA;  Surgeon: Radiologist, Medication, MD;  Location: [facility];  Service: Radiology;  Laterality: N/A;  . ROTATOR CUFF REPAIR    . SEPTOPLASTY  1959   Deviated Septum  . 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 MRI negative. Pt has a history of  dyspahgia due to decreased mentation and prior CVA. Awareness of intake impaired in March, recommended thickened liquid and purees because of this with good potential to upgrade.  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 P   Assessment / Plan / Recommendation Clinical Impression  Pt demonstrates impaired mentation and awareness of PO with instances of falling asleep suddenly while eating. When trying to take sips when not fully aware pt has immediate coughing. He tries to continue feeding himself when cup or cracker is not in his hand any longer. With increased cueing and sips of nectar thick liquids, pt was able to to drink several oz without difficulty. He is able to [facility] but given his poor awareness, recommend softening textures to dys 2. Will f/u for upgrade and pt is expected to improved when mentation returns to baseline. SLP Visit Diagnosis: Dysphagia, oropharyngeal phase (R13.12)    Aspiration Risk  Moderate aspiration risk    Diet Recommendation Dysphagia 2 (Fine chop);Nectar-thick liquid   Liquid Administration via: Cup Medication Administration: Crushed with puree Supervision: Full supervision/cueing for compensatory strategies;Staff to assist with self feeding Compensations: Slow rate;Small sips/bites Postural Changes: Seated upright at 90 degrees    Other  Recommendations     Follow up Recommendations 24 hour supervision/assistance      Frequency and Duration min 2x/week  2 weeks       Prognosis Prognosis for Safe Diet Advancement: Good  Swallow Study   General HPI: 80yo male admitted 11/18/20 presenting with c/o L LE weakness as well as multiple falls at home, confusion. MRI negative. Pt has a history of dyspahgia due to decreased mentation and prior CVA. Awareness of intake impaired in March, recommended thickened liquid and purees because of this with good potential to upgrade.  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 P Type of Study: Bedside  Swallow Evaluation Previous Swallow Assessment: see HPI Diet Prior to this Study: Dysphagia 1 (puree);Thin liquids Temperature Spikes Noted: No Respiratory Status: Room air History of Recent Intubation: No Behavior/Cognition: Lethargic/Drowsy;Requires cueing Oral Care Completed by SLP: No Oral Cavity - Dentition: Adequate natural dentition Vision: Impaired for self-feeding Self-Feeding Abilities: Needs assist Patient Positioning: Upright in bed Baseline Vocal Quality: Normal Volitional Cough: Strong Volitional Swallow: Able to elicit    Oral/Motor/Sensory Function Overall Oral Motor/Sensory Function: Within functional limits   Ice Chips Ice chips: Not tested   Thin Liquid Thin Liquid: Impaired Presentation: Straw;Cup Oral Phase Impairments: Poor awareness of bolus Pharyngeal  Phase Impairments: Cough - Immediate;Cough - Delayed    Nectar Thick Nectar Thick Liquid: Impaired Presentation: Cup;Straw Pharyngeal Phase Impairments: Cough - Delayed   Honey Thick Honey Thick Liquid: Not tested   Puree Puree: Within functional limits   Solid     Solid: Impaired Oral Phase Impairments: Poor awareness of bolus      Elain Wixon, Katherene Ponto 11/19/2020,1:24 PM

## 2020-11-19 NOTE — Progress Notes (Addendum)
PROGRESS NOTE   Frank [name]  [id number]    DOB: 10/06/1940    DOA: 11/18/2020  PCP: Janith Lima, MD   I have briefly reviewed patients previous medical records in [facility].  Chief Complaint  Patient presents with  . 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  He had been discharged on DAPT (aspirin and Brilinta), however he ran out of Brilinta 2 weeks PTA and unable to refill.  In the ED, code stroke was called.  Neurology evaluated, CTH, CTA with no new stroke, no LVO, suspecting TIA versus recrudescence due to infectious/metabolic abnormalities and less likely due to seizures.   Assessment & Plan:    Left leg weakness History as noted above.  Code stroke was activated in ED.  CT head and CTA head without new stroke and no large vessel occlusion but showed prior R MCA stroke.  MRI brain shows no acute intracranial abnormality, chronic right MCA territory infarct and chronic bilateral subdural hematomas, unchanged from 3/22.  EEG done and report pending.  As per neurology consultation yesterday, diagnosis of TIA (primary diagnosis) versus recrudescence of prior strokes versus seizures (less likely).  Presentation felt to be from missed Brilinta.  TTE recently on  09/09/2020 with preserved LVEF and grade 1 diastolic dysfunction.  LDL 53.  A1c 7.7. TSH: 0.459.  Continuing aspirin 81 Mg daily and Brilinta 45 Mg twice daily.  Continue atorvastatin 10 Mg daily.  PT recommends SNF.  ST recommends dysphagia 2 and nectar thickened liquids.  Discussed with stroke MD, feels more likely recrudescence of prior stroke related to UTI, dehydration and missing Brilinta.  He indicated that if patient cannot afford Brilinta, then can be substituted with Plavix 75 Mg daily along with aspirin.  Stroke ruled out.  Acute metabolic encephalopathy Likely related to acute cystitis complicating possible underlying vascular dementia and CKD.  No acute stroke on imaging.  Per stroke team, TIA also felt to be less likely.  UDS positive for opiates and recently started codeine cough syrup could certainly have contributed to his presentation and opioids should be avoided.  E. coli acute cystitis without hematuria: Urine microscopy suggestive of UTI and urine culture shows >100 K colonies of E. coli, susceptibilities pending.  Started ceftriaxone.  Although penicillin is listed as an allergy, as discussed with pharmacy, patient has tolerated multiple cephalosporins and Augmentin in the recent past.  Dysphagia Speech therapy evaluated and recommended dysphagia 2 diet and nectar thickened liquids.  Strict TIA precautions.  Transient hypoxia Chest x-ray showed bilateral atelectasis.  Concern regarding chronic aspiration.  Diet per speech therapy as noted above.  No clinical or radiological pneumonia at this time.  In any event, ceftriaxone starting for UTI should cover if he even had any.  CKD stage IIIa Serum creatinine was in the 1.3-1.4 range in March.  However this had worsened to 1.7 in April.  Now presented with creatinine of 1.81 which has improved to 1.55.  Did receive IV contrast.  On IV fluids.  Follow BMP in a.m. to see for improvement.  It is possible that he may have progressed to  CKD stage IIIb.  Type II DM with renal complications Mildly uncontrolled and fluctuating.  Continue Lantus, SSI and adjust insulins as needed.  OSA on nightly CPAP.  History of TBI Keppra resumed in March.  Essential hypertension Reasonably controlled.  Anemia in CKD Stable.  Goals of care Patient's spouse discussed with [facility] team and requesting palliative care consult.  Requested.  Body mass index is 30.27 kg/m./Obesity    DVT prophylaxis: heparin injection 5,000 Units Start: 11/18/20 1400     Code Status: DNR Family Communication: I discussed with spouse via phone, updated care and answered questions.  She was appreciative of the call and requests updates. Disposition:  Status is: Inpatient  Remains inpatient appropriate because:Inpatient level of care appropriate due to severity of illness   Dispo: The patient is from: Home              Anticipated d/c is to: SNF              Patient currently is not medically stable to d/c.   Difficult to place patient No        Consultants:   Neurology  Procedures:   None  Antimicrobials:    Anti-infectives (From admission, onward)   None        Subjective:  Seen this morning.  Reports chronic dry cough related to his postnasal drip.  Although alert and oriented x3, intermittently mildly confused.  Denies fever or pain.  States that he lives with his spouse and is independent of activities of daily living  Objective:   Vitals:   11/19/20 0335 11/19/20 0426 11/19/20 0736 11/19/20 1203  BP: (!) 153/86  135/84 (!) 144/78  Pulse: 85  96 88  Resp:   17 19  Temp: 98.3 F (36.8 C)  99.3 F (37.4 C) 97.9 F (36.6 C)  TempSrc: Oral  Oral Oral  SpO2: 90% 94% 92% 94%  Weight:      Height:        General exam: Pleasant elderly male, moderately built and nourished lying comfortably propped up in bed without distress.  Oral mucosa moist. Respiratory system: Clear to auscultation. Respiratory effort  normal. Cardiovascular system: S1 & S2 heard, RRR. No JVD, murmurs, rubs, gallops or clicks. No pedal edema.  Telemetry personally reviewed: Sinus rhythm, on demand V paced rhythm. Gastrointestinal system: Abdomen is nondistended, soft and nontender. No organomegaly or masses felt. Normal bowel sounds heard. Central nervous system: Alert and oriented x3. No focal neurological deficits.  Appears mildly confused at times. Extremities: Symmetric 5 x 5 power. Skin: No rashes, lesions or ulcers Psychiatry: Judgement and insight appear normal. Mood & affect appropriate.     Data Reviewed:   I have personally reviewed following labs and imaging studies   CBC: Recent Labs  Lab 11/18/20 0902 11/18/20 1002  WBC 8.1  --   NEUTROABS 6.0  --   HGB 10.6* 9.9*  HCT 34.5* 29.0*  MCV 85.6  --   PLT 193  --     Basic Metabolic Panel: Recent Labs  Lab 11/18/20 0902 11/18/20 1002 11/19/20 0324  NA 136 135 137  K 4.1 4.1 4.0  CL 101 100 100  CO2 27  --  28  GLUCOSE 235* 213* 212*  BUN 30* 31* 30*  CREATININE 1.81* 1.60* 1.55*  CALCIUM 9.1  --  9.2    Liver Function Tests: Recent Labs  Lab 11/18/20 0902  AST 18  ALT 15  ALKPHOS 111  BILITOT 0.8  PROT 6.7  ALBUMIN 3.3*    CBG: Recent Labs  Lab 11/18/20 2151 11/19/20 0629 11/19/20 1207  GLUCAP 229* 195* 247*    Microbiology Studies:   Recent Results (from the past 240 hour(s))  Resp Panel by RT-PCR (Flu A&B, Covid) Nasopharyngeal Swab     Status: None   Collection Time: 11/18/20  9:54 AM   Specimen: Nasopharyngeal Swab; Nasopharyngeal(NP) swabs in vial transport medium  Result Value Ref Range Status   SARS Coronavirus 2 by RT PCR NEGATIVE NEGATIVE Final    Comment: (NOTE) SARS-CoV-2 target nucleic acids are NOT DETECTED.  The SARS-CoV-2 RNA is generally detectable in upper respiratory specimens during the acute phase of infection. The lowest concentration of SARS-CoV-2 viral copies this assay can detect is 138  copies/mL. A negative result does not preclude SARS-Cov-2 infection and should not be used as the sole basis for treatment or other patient management decisions. A negative result may occur with  improper specimen collection/handling, submission of specimen other than nasopharyngeal swab, presence of viral mutation(s) within the areas targeted by this assay, and inadequate number of viral copies(<138 copies/mL). A negative result must be combined with clinical observations, patient history, and epidemiological information. The expected result is Negative.  Fact Sheet for Patients:  EntrepreneurPulse.com.au  Fact Sheet for Healthcare Providers:  IncredibleEmployment.be  This test is no t yet approved or cleared by the [place] FDA and  has been authorized for detection and/or diagnosis of SARS-CoV-2 by FDA under an Emergency Use Authorization (EUA). This EUA will remain  in effect (meaning this test can be used) for the duration of the COVID-19 declaration under Section 564(b)(1) of the Act, 21 U.S.C.section 360bbb-3(b)(1), unless the authorization is terminated  or revoked sooner.       Influenza A by PCR NEGATIVE NEGATIVE Final   Influenza B by PCR NEGATIVE NEGATIVE Final    Comment: (NOTE) The Xpert Xpress SARS-CoV-2/FLU/RSV plus assay is intended as an aid in the diagnosis of influenza from Nasopharyngeal swab specimens and should not be used as a sole basis for treatment. Nasal washings and aspirates are unacceptable for Xpert Xpress SARS-CoV-2/FLU/RSV testing.  Fact Sheet for Patients: EntrepreneurPulse.com.au  Fact Sheet for Healthcare Providers: IncredibleEmployment.be  This test is not yet approved or cleared by the [place] FDA and has been authorized for detection and/or diagnosis of SARS-CoV-2 by FDA under an Emergency Use Authorization (EUA). This EUA will remain in effect (meaning  this test can be used) for the duration of the COVID-19 declaration under Section 564(b)(1) of the Act, 21 U.S.C. section 360bbb-3(b)(1), unless the authorization is terminated or revoked.  Performed at [facility], [place] [address], [place], [place] [address]   Culture, Urine     Status: Abnormal (Preliminary result)   Collection Time: 11/18/20 11:56 AM   Specimen: Urine, Catheterized  Result Value Ref Range Status   Specimen Description URINE, CATHETERIZED  Final   Special Requests NONE  Final   Culture (A)  Final    >=100,000 COLONIES/mL ESCHERICHIA COLI SUSCEPTIBILITIES TO FOLLOW Performed at [facility], [place] [address], [place], [place] [address]    Report Status PENDING  Incomplete     Radiology Studies:  DG Chest 2 View  Result  Date: 11/18/2020 CLINICAL DATA:  Cough EXAM: CHEST - 2 VIEW COMPARISON:  November 05, 2020 FINDINGS: There is atelectatic change in the lung bases. No edema or airspace opacity. Pacemaker present with lead tips in right atrium and right ventricle regions. Heart size and pulmonary vascularity are normal. No adenopathy. There is aortic atherosclerosis. There is calcification in the left carotid artery. There is degenerative change in the thoracic spine. Postoperative change noted in right shoulder. Evidence of prior trauma lateral right clavicle. IMPRESSION: Bibasilar atelectasis. No edema or airspace opacity. Heart size within normal limits. Pacemaker lead positions unchanged. Aortic Atherosclerosis (ICD10-I70.0). Electronically Signed   By: Lowella Grip III M.D.   On: 11/18/2020 13:20   MR BRAIN WO CONTRAST  Result Date: 11/18/2020 CLINICAL DATA:  Altered mental status with increased left lower extremity weakness over baseline. History of right MCA stroke. EXAM: MRI HEAD WITHOUT CONTRAST TECHNIQUE: Multiplanar, multiecho pulse sequences of the brain and surrounding structures were obtained without intravenous contrast. COMPARISON:  Head CT  11/18/2020 and MRI 09/10/2020 FINDINGS: The study is mildly to moderately motion degraded. Brain: There is a moderately large chronic posterior right MCA territory infarct with associated chronic blood products (acute to subacute in 09/2020). No acute infarct is identified. Chronic bilateral subdural hematomas measure up to 8 mm in thickness on the right and 9 mm on the left, unchanged from the prior MRI. There is no midline shift or other significant mass effect. Small T2 hyperintensities in the cerebral white matter bilaterally are unchanged from the prior MRI and are nonspecific but compatible with mild chronic small vessel ischemic disease. There is mild cerebral atrophy. Vascular: Major intracranial vascular flow voids are grossly preserved. Susceptibility artifact in the right sylvian fissure from a right MCA branch vessel stent. Skull and upper cervical spine: Left frontal craniotomy. Right frontal burr hole. Sinuses/Orbits: Bilateral cataract extraction. Mild bilateral ethmoid sinus mucosal thickening. Clear mastoid air cells. Other: None. IMPRESSION: 1. No acute intracranial abnormality. 2. Chronic right MCA territory infarct. 3. Chronic bilateral subdural hematomas, unchanged from 09/2020. 4. Mild chronic small vessel ischemic disease. Electronically Signed   By: Logan Bores M.D.   On: 11/18/2020 16:18   CT HEAD CODE STROKE WO CONTRAST  Result Date: 11/18/2020 CLINICAL DATA:  Code stroke. 80 year old male last known well at midnight. Right MCA infarct with hemorrhagic transformation in March. Right MCA M2 stent. History of unresolved subdural hematomas. EXAM: CT HEAD WITHOUT CONTRAST TECHNIQUE: Contiguous axial images were obtained from the base of the skull through the vertex without intravenous contrast. COMPARISON:  Head CT 09/18/2020 and earlier. FINDINGS: Brain: Mixed density bilateral subdural hematomas are stable since March, measuring up to 8-9 mm in thickness bilaterally and most pronounced  over the posterior convexities. Developing encephalomalacia throughout the posterior right MCA territory with resolved hyperdense hemorrhage and mass effect since March. Hypodensity in the posterior left corona radiata, posterior left internal capsule and medial left thalamus appears increased since March. Gray-white matter differentiation elsewhere appears stable. No acute cortically based infarct identified. Vascular: Mild Calcified atherosclerosis at the skull base. Right MCA branch stent in the sylvian fissure appears stable. Skull: Previous left superior convexity craniotomy, bilateral burr holes. No acute osseous abnormality identified. Sinuses/Orbits: Mildly improved ethmoid sinus mucosal thickening. Other Visualized paranasal sinuses and mastoids are stable and well aerated. Other: No acute orbit or scalp soft tissue findings. ASPECTS [facility] Stroke Program Early CT Score) Total score (0-10 with 10 being normal): 10 IMPRESSION: 1. No acute cortically based infarct  or acute intracranial hemorrhage identified. ASPECTS 10. 2. 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 3. Questionable increased small vessel disease in the posterior limb left internal capsule and left thalamus since March. 4. Unresolved small bilateral mixed density subdural hematomas remain stable. Study discussed by telephone with Dr. Donnetta Simpers on 11/18/2020 at 09:28 . Electronically Signed   By: Genevie Ann M.D.   On: 11/18/2020 09:30   CT ANGIO HEAD CODE STROKE  Result Date: 11/18/2020 CLINICAL DATA:  Code stroke. 80 year old male last known well at midnight. Right MCA infarct with hemorrhagic transformation in March. Right MCA M2 stent. History of unresolved subdural hematomas. EXAM: CT ANGIOGRAPHY HEAD AND NECK TECHNIQUE: Multidetector CT imaging of the head and neck was performed using the standard protocol during bolus administration of intravenous  contrast. Multiplanar CT image reconstructions and MIPs were obtained to evaluate the vascular anatomy. Carotid stenosis measurements (when applicable) are obtained utilizing NASCET criteria, using the distal internal carotid diameter as the denominator. CONTRAST:  63mL OMNIPAQUE IOHEXOL 350 MG/ML SOLN COMPARISON:  Plain head CT 0919 hours today. CTA head and neck and CTP 09/09/20. FINDINGS: CTA NECK Skeleton: Stable.  No acute osseous abnormality identified. Upper chest: Left chest cardiac pacemaker redemonstrated. Patchy nonspecific upper lung opacity has not significantly changed since February and may be scarring. No superior mediastinal lymphadenopathy. Visible central pulmonary arteries appear patent. Other neck: Stable.  No acute finding. Aortic arch: Stable mild calcified arch atherosclerosis. Three vessel arch configuration. Right carotid system: Stable right CCA plaque without significant stenosis proximal to the bifurcation. Bulky calcified plaque at the right ICA origin and bulb resulting in radiographic string sign stenosis appears stable on series 5, image 104. The right ICA remains patent to the skull base. Left carotid system: Stable left CCA, left carotid bifurcation, proximal left ICA, and mild distal left cervical ICA calcified plaque with less than 50% stenosis. Vertebral arteries: Stable, and notable for mild to moderate right vertebral artery origin stenosis due to calcified plaque. CTA HEAD Posterior circulation: Stable and negative. Mildly dominant left V4 segment. Both posterior communicating arteries are small but present. Anterior circulation: Both ICA siphons remain patent. Calcified siphon atherosclerosis is stable, mild to moderate and not associated with significant siphon stenosis. Normal posterior communicating artery and ophthalmic artery origins. Patent carotid termini, MCA and ACA origins. Dominant left A1 as before. 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 Left MCA M1 and left MCA branches are stable and within normal limits. Right MCA M1 and right MCA bifurcation remain patent without stenosis. Posterior right M2 stent has been placed traversing the embolized calcification demonstrated in February. The stent appears patent and enhancing (series 15, image 10). Although posterior right MCA division branches distal to the stent remain diminutive or absent. Other right MCA branches appear stable. Venous sinuses: Patent. Anatomic variants: Dominant left ACA A1. Review of the MIP images confirms the above findings IMPRESSION: 1. Negative for emergent large vessel occlusion. 2. Stented posterior Right MCA branch where the embolized calcification was noted in February. The stent is patent. And other MCA branches remain stable. 3. 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 Electronically Signed   By: Genevie Ann M.D.   On: 11/18/2020 10:09  CT ANGIO NECK CODE STROKE  Result Date: 11/18/2020 CLINICAL DATA:  Code stroke. 80 year old male last known well at midnight. Right MCA infarct with hemorrhagic transformation in March. Right MCA M2 stent. History of unresolved subdural hematomas. EXAM: CT ANGIOGRAPHY HEAD AND NECK TECHNIQUE: Multidetector CT imaging of the head and neck was performed using the standard protocol during bolus administration of intravenous contrast. Multiplanar CT image reconstructions and MIPs were obtained to evaluate the vascular anatomy. Carotid stenosis measurements (when applicable) are obtained utilizing NASCET criteria, using the distal internal carotid diameter as the denominator. CONTRAST:  29mL OMNIPAQUE IOHEXOL 350 MG/ML SOLN COMPARISON:  Plain head CT 0919 hours today. CTA head and neck  and CTP 09/09/20. FINDINGS: CTA NECK Skeleton: Stable.  No acute osseous abnormality identified. Upper chest: Left chest cardiac pacemaker redemonstrated. Patchy nonspecific upper lung opacity has not significantly changed since February and may be scarring. No superior mediastinal lymphadenopathy. Visible central pulmonary arteries appear patent. Other neck: Stable.  No acute finding. Aortic arch: Stable mild calcified arch atherosclerosis. Three vessel arch configuration. Right carotid system: Stable right CCA plaque without significant stenosis proximal to the bifurcation. Bulky calcified plaque at the right ICA origin and bulb resulting in radiographic string sign stenosis appears stable on series 5, image 104. The right ICA remains patent to the skull base. Left carotid system: Stable left CCA, left carotid bifurcation, proximal left ICA, and mild distal left cervical ICA calcified plaque with less than 50% stenosis. Vertebral arteries: Stable, and notable for mild to moderate right vertebral artery origin stenosis due to calcified plaque. CTA HEAD Posterior circulation: Stable and negative. Mildly dominant left V4 segment. Both posterior communicating arteries are small but present. Anterior circulation: Both ICA siphons remain patent. Calcified siphon atherosclerosis is stable, mild to moderate and not associated with significant siphon stenosis. Normal posterior communicating artery and ophthalmic artery origins. Patent carotid termini, MCA and ACA origins. Dominant left A1 as before. 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 Left MCA M1 and left MCA branches are stable and within normal limits. Right MCA M1 and right MCA bifurcation remain patent without stenosis. Posterior right M2 stent has been placed traversing the embolized calcification demonstrated in February. The stent appears patent and enhancing (series  15, image 10). Although posterior right MCA division branches distal to the stent remain diminutive or absent. Other right MCA branches appear stable. Venous sinuses: Patent. Anatomic variants: Dominant left ACA A1. Review of the MIP images confirms the above findings IMPRESSION: 1. Negative for emergent large vessel occlusion. 2. Stented posterior Right MCA branch where the embolized calcification was noted in February. The stent is patent. And other MCA branches remain stable. 3. 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 Electronically Signed   By: Genevie Ann M.D.   On: 11/18/2020 10:09     Scheduled Meds:   .  stroke: mapping our early stages of recovery book   Does not apply Once  . aspirin EC  81 mg Oral Daily  . atorvastatin  10 mg Oral Daily  . [START ON 11/20/2020] cloNIDine  0.1 mg Transdermal Q Wed  . darifenacin  7.5 mg Oral Daily  . ferrous sulfate  325 mg Oral Q breakfast  . FLUoxetine  20 mg Oral Daily  .  heparin  5,000 Units Subcutaneous Q12H  . insulin aspart  0-9 Units Subcutaneous TID WC  . insulin glargine  10 Units Subcutaneous Daily  . levETIRAcetam  500 mg Oral BID  . loratadine  10 mg Oral QPM  . pantoprazole  40 mg Oral Daily  . ticagrelor  45 mg Oral BID    Continuous Infusions:   . sodium chloride 100 mL/hr at 11/18/20 1902     LOS: 1 day     Vernell Leep, MD, [place], [facility]. [facility]    To contact the attending provider between 7A-7P or the covering provider during after hours 7P-7A, please log into the web site [email or web address] and access using universal [place] password for that web site. If you do not have the password, please call the hospital operator.  11/19/2020, 4:21 PM

## 2020-11-19 NOTE — Evaluation (Signed)
Occupational Therapy Evaluation Patient Details Name: Frank [name] MRN: [id number] DOB: [birth year] Today's Date: 11/19/2020    History of Present Illness 80yo male admitted 11/18/20 presenting with c/o L LE weakness as well as multiple falls at home, confusion. Per neurology w/u, concern for TIA vs recrudescence of past CVA. 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 Pt currently very lethargic and with decreased arousal. Pt requiring Total A for repositioning in bed and optimizing upright posture. Pt requiring Total A for ADLs. Pt would benefit from further acute OT to facilitate safe dc. Recommend dc to SNF for further OT to optimize safety, independence with ADLs, and return to PLOF.     Follow Up Recommendations  SNF    Equipment Recommendations  Other (comment) (Defer to next venue)    Recommendations for Other Services PT consult     Precautions / Restrictions Precautions Precautions: Fall;Other (comment) Precaution Comments: recent R MCA CVA/L weakness, posterior LOB, high fall risk      Mobility Bed Mobility Overal bed mobility: Needs Assistance             General bed mobility comments: Total A for repositioning in bed and optimziing upright posture    Transfers                 General transfer comment: Defered due to lethargy    Balance Overall balance assessment: History of Falls                                         ADL either performed or assessed with clinical judgement   ADL Overall ADL's : Needs assistance/impaired   Eating/Feeding Details (indicate cue type and reason): Total A for self feeding. Pt eating off of spoon and then smacking his lips. Limited by lethargy                                   General ADL Comments: Evaluation limited due to pt's lethargy. Pt waking up for a couple  seconds and then yelling something suchs "Ill sleep till tomorrow" or "I will eat tomorrow"     Vision         Perception     Praxis      Pertinent Vitals/Pain Pain Assessment: Faces Faces Pain Scale: No hurt Pain Intervention(s): Monitored during session     Hand Dominance Right   Extremity/Trunk Assessment Upper Extremity Assessment Upper Extremity Assessment: RUE deficits/detail;Difficult to assess due to impaired cognition RUE Deficits / Details: Increased tone at shoulder and elbow. Limited PROM RUE Coordination: decreased gross motor   Lower Extremity Assessment Lower Extremity Assessment: Defer to PT evaluation   Cervical / Trunk Assessment Cervical / Trunk Assessment: Kyphotic   Communication Communication Communication: No difficulties   Cognition Arousal/Alertness: Lethargic Behavior During Therapy: Flat affect Overall Cognitive Status: Difficult to assess                                     General Comments  VSS on RA    Exercises Exercises: General Upper Extremity General Exercises - Upper Extremity Shoulder Flexion:  PROM;Left;5 reps;Supine Shoulder Extension: PROM;Left;5 reps;Supine Elbow Flexion: PROM;Left;5 reps;Supine Elbow Extension: PROM;Left;5 reps;Supine Wrist Flexion: PROM;Left;5 reps Wrist Extension: PROM;Left;Supine;5 reps Digit Composite Flexion: PROM;Left;5 reps;Supine Composite Extension: PROM;Left;5 reps;Seated   Shoulder Instructions      Home Living Family/patient expects to be discharged to:: Skilled nursing facility Living Arrangements: Spouse/significant other Available Help at Discharge: Available 24 hours/day;Family;Personal care attendant Type of Home: House Home Access: Stairs to enter [employer] of Steps: 4 Entrance Stairs-Rails: Can reach both Home Layout: One level     Bathroom Shower/Tub: Occupational psychologist: Handicapped height     Home Equipment: Environmental consultant - 2  wheels;Shower seat;Grab bars - tub/shower;Grab bars - toilet;Cane - single point;Walker - 4 wheels;Wheelchair - manual   Additional Comments: Information from chart review; no family present and pt lethargic      Prior Functioning/Environment Level of Independence: Independent with assistive device(s)        Comments: Per RN and chart review, pt was performing BADLs and using walker after sx in feb. Going to OP PT        OT Problem List: Decreased strength;Decreased activity tolerance;Decreased range of motion;Impaired balance (sitting and/or standing);Decreased cognition;Decreased knowledge of use of DME or AE;Decreased knowledge of precautions;Impaired UE functional use      OT Treatment/Interventions: Self-care/ADL training;Therapeutic exercise;Energy conservation;DME and/or AE instruction;Therapeutic activities;Patient/family education    OT Goals(Current goals can be found in the care plan section) Acute Rehab OT Goals Patient Stated Goal: Unstated OT Goal Formulation: With patient Time For Goal Achievement: 12/03/20 Potential to Achieve Goals: Good  OT Frequency: Min 2X/week   Barriers to D/C:            Co-evaluation              AM-PAC OT "6 Clicks" Daily Activity     Outcome Measure Help from another person eating meals?: Total Help from another person taking care of personal grooming?: Total Help from another person toileting, which includes using toliet, bedpan, or urinal?: Total Help from another person bathing (including washing, rinsing, drying)?: Total Help from another person to put on and taking off regular upper body clothing?: Total Help from another person to put on and taking off regular lower body clothing?: Total 6 Click Score: 6   End of Session Nurse Communication: Mobility status  Activity Tolerance: Patient tolerated treatment well Patient left: in bed;with call bell/phone within reach;with bed alarm set  OT Visit Diagnosis:  Unsteadiness on feet (R26.81);Other abnormalities of gait and mobility (R26.89);Muscle weakness (generalized) (M62.81)                Time: 2585-2778 OT Time Calculation (min): 15 min Charges:  OT General Charges $OT Visit: 1 Visit OT Evaluation $OT Eval Moderate Complexity: [place], OTR/L Acute Rehab Pager: [phone] Office: [place] 11/19/2020, 5:41 PM

## 2020-11-19 NOTE — Progress Notes (Signed)
STROKE TEAM PROGRESS NOTE   INTERVAL HISTORY No acute events No visitors at bedside Wife takes care of meds Using walker at home. Urine culture pending, Serum Cre elevated We discussed his negative imaging for new stroke and plan to stay on ASA and Brilinta. His questions were addressed.   Vitals:   11/18/20 2340 11/19/20 0335 11/19/20 0426 11/19/20 0736  BP:  (!) 153/86  135/84  Pulse:  85  96  Resp:    17  Temp:  98.3 F (36.8 C)  99.3 F (37.4 C)  TempSrc:  Oral  Oral  SpO2: 93% 90% 94% 92%  Weight:      Height:       CBC:  Recent Labs  Lab 11/18/20 0902 11/18/20 1002  WBC 8.1  --   NEUTROABS 6.0  --   HGB 10.6* 9.9*  HCT 34.5* 29.0*  MCV 85.6  --   PLT 193  --    Basic Metabolic Panel:  Recent Labs  Lab 11/18/20 0902 11/18/20 1002 11/19/20 0324  NA 136 135 137  K 4.1 4.1 4.0  CL 101 100 100  CO2 27  --  28  GLUCOSE 235* 213* 212*  BUN 30* 31* 30*  CREATININE 1.81* 1.60* 1.55*  CALCIUM 9.1  --  9.2   Lipid Panel:  Recent Labs  Lab 11/19/20 0324  CHOL 104  TRIG 91  HDL 33*  CHOLHDL 3.2  VLDL 18  LDLCALC 53   HgbA1c:  Recent Labs  Lab 11/19/20 0324  HGBA1C 7.7*   Urine Drug Screen:  Recent Labs  Lab 11/18/20 1137  LABOPIA POSITIVE*  COCAINSCRNUR NONE DETECTED  LABBENZ NONE DETECTED  AMPHETMU NONE DETECTED  THCU NONE DETECTED  LABBARB NONE DETECTED    Alcohol Level  Recent Labs  Lab 11/18/20 0902  ETH <10    IMAGING  DG Chest 2 View  Result Date: 11/18/2020 CLINICAL DATA:  Cough EXAM: CHEST - 2 VIEW COMPARISON:  November 05, 2020 FINDINGS: There is atelectatic change in the lung bases. No edema or airspace opacity. Pacemaker present with lead tips in right atrium and right ventricle regions. Heart size and pulmonary vascularity are normal. No adenopathy. There is aortic atherosclerosis. There is calcification in the left carotid artery. There is degenerative change in the thoracic spine. Postoperative change noted in right shoulder.  Evidence of prior trauma lateral right clavicle. IMPRESSION: Bibasilar atelectasis. No edema or airspace opacity. Heart size within normal limits. Pacemaker lead positions unchanged. Aortic Atherosclerosis (ICD10-I70.0). Electronically Signed   By: Lowella Grip III M.D.   On: 11/18/2020 13:20   MR BRAIN WO CONTRAST  Result Date: 11/18/2020 CLINICAL DATA:  Altered mental status with increased left lower extremity weakness over baseline. History of right MCA stroke. EXAM: MRI HEAD WITHOUT CONTRAST TECHNIQUE: Multiplanar, multiecho pulse sequences of the brain and surrounding structures were obtained without intravenous contrast. COMPARISON:  Head CT 11/18/2020 and MRI 09/10/2020 FINDINGS: The study is mildly to moderately motion degraded. Brain: There is a moderately large chronic posterior right MCA territory infarct with associated chronic blood products (acute to subacute in 09/2020). No acute infarct is identified. Chronic bilateral subdural hematomas measure up to 8 mm in thickness on the right and 9 mm on the left, unchanged from the prior MRI. There is no midline shift or other significant mass effect. Small T2 hyperintensities in the cerebral white matter bilaterally are unchanged from the prior MRI and are nonspecific but compatible with mild chronic small vessel ischemic  disease. There is mild cerebral atrophy. Vascular: Major intracranial vascular flow voids are grossly preserved. Susceptibility artifact in the right sylvian fissure from a right MCA branch vessel stent. Skull and upper cervical spine: Left frontal craniotomy. Right frontal burr hole. Sinuses/Orbits: Bilateral cataract extraction. Mild bilateral ethmoid sinus mucosal thickening. Clear mastoid air cells. Other: None. IMPRESSION: 1. No acute intracranial abnormality. 2. Chronic right MCA territory infarct. 3. Chronic bilateral subdural hematomas, unchanged from 09/2020. 4. Mild chronic small vessel ischemic disease. Electronically  Signed   By: Logan Bores M.D.   On: 11/18/2020 16:18   PHYSICAL EXAM Constitutional: Appears well-developed and well-nourished.  Psych: Affect appropriate to situation. Calm and cooperative.  Eyes: No scleral injection HENT: No OP obstrucion Head: Normocephalic.  Cardiovascular: Normal rate and regular rhythm.  Respiratory: Effort normal and breath sounds normal to anterior ascultation GI: Soft.  No distension. There is no tenderness.  Skin: WDI  Neuro: Mental Status: Patient is awake, alert, oriented to person, place, month, year, and situation. Patient is able to give a clear and coherent history. No signs of neglect. Speech/Language:  Speech is clear, fluent without dysarthria or aphasia. Repetition, naming, and comprehension intact.  Cranial Nerves: II: Visual Fields are full. Pupils are equal, round, and sluggish due to prior cataract surgery.  III,IV, VI: EOMI without ptosis or diploplia.  V: Facial sensation is symmetric to light touch in V1, V2, and V3 VII: Facial movement is symmetric.  VIII: hearing is intact to voice. X: Uvula elevates symmetrically. XI: Shoulder shrug is symmetric. XII: tongue is midline without atrophy or fasciculations.  Motor: RUE:  grips  5/5     biceps  5/5     triceps 5 /5 LUE:  grips  5 /5    biceps 5 /5     triceps 5 /5 RLE: knee 5 /5     thigh   5/5     plantar flexion  5/5     dorsiflexion 5/5 LLE: knee  4/5     thigh   4/5      plantar flexion   5/5     dorsiflexion   5/5  Tone is normal. Bulk is normal.  Sensory: Sensation is symmetric to light touch in all fours extremities. Extinction absent to light touch DSS.  Plantars: Toes are downgoing bilaterally.  Cerebellar: No ataxia noted with FNF and HKS bilaterally. Finger roll finger tapping without ataxia.   ASSESSMENT/PLAN Mr. 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 H  TIA vs. recrudescence of prior stroke symptoms likely due to being off Brilinta x 7days (not able to get refill from PCP).   Code Stroke: CT head No acute abnormality.   CTA head & neck: No LVO, chronic findings as above  MRI Brain: No acute intracranial abnormality. Chronic right MCA territory infarct. Chronic bilateral subdural hematomas, unchanged from 09/2020. Mild chronic small vessel ischemic disease  Recent 2D Echo: 09/09/20: EF 65-70%  LDL 53  HgbA1c 7.7    Diet   DIET - DYS 1 Room service appropriate? Yes; Fluid consistency: Thin     Patient on ASA 81mg  and Brilinta 45mg   BID (renal dose) prior to admission but had not taken brilinta x one week  Continue ASA 81mg  and Brilinta 45mg  BID   Therapy recommendations:  TBD  Disposition:  TBD  Hypertension . BP goal normotensive  Hyperlipidemia  Home meds: Lipitor 10mg   LDL 53, at goal < 70  High intensity statin not indicated as LDL at goal  Continue statin at discharge  Diabetes type II Uncontrolled  HgbA1c 7.7, goal < 7.0  CBGs Recent Labs    11/18/20 1855 11/18/20 2151 11/19/20 0629  GLUCAP 174* 229* 195*     Management per primary team  Other Stroke Risk Factors  Advanced Age >/= 72   Obesity,Body mass index is 30.27 BMI >/= 30 associated with increased stroke risk, recommend weight loss, diet and exercise as appropriate   Coronary artery disease AV block, Mobitz 2, pacer  Obstructive sleep apnea, CPAP at home  UTI with E. coli  Other Active Problems    Hospital day # 1  I have personally obtained history,examined this patient, reviewed notes, independently viewed imaging studies, participated in medical decision making and plan of care.ROS completed by me  personally and pertinent positives fully documented  I have made any additions or clarifications directly to the above note. Agree with note above.  Patient presented with transient worsening of his old deficits in the setting of dehydration and possible UTI and being off Brilinta for a week since he could not afford it.  Patient counseled to be compliant with the medications.  If he is unable to afford Brilinta may consider switching to Plavix otherwise continue his prior medication regimen for stroke prevention.  IV hydration.  Treat UTI with antibiotics as per primary team Long discussion with patient and answered questions.  Discussed with Dr.Hongalgi.  Stroke team will sign off.  Kindly call for questions greater than 50% time during this 35-minute visit was spent on counseling and coordination of care about TIA versus worsening of stroke and discussion about stroke prevention and answering questions.  Antony Contras, MD Medical Director [facility] Pager: [phone] 11/19/2020 4:41 PM   To contact Stroke Continuity provider, please refer to [email or web address] After hours, contact General Neurology

## 2020-11-19 NOTE — Evaluation (Signed)
Speech Language Pathology Evaluation Patient Details Name: Frank [name] MRN: [id number] DOB: [birth year] Today's Date: 11/19/2020 Time: 1107-1130 SLP Time Calculation (min) (ACUTE ONLY): 23 min  Problem List:  Patient Active Problem List   Diagnosis Date Noted  . Simple chronic bronchitis ([place]) 11/13/2020  . Iron deficiency anemia secondary to inadequate dietary iron intake 11/06/2020  . DNR (do not resuscitate) discussion 10/17/2020  . Stroke (cerebrum) ([place]) 09/09/2020  . 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 Chronic renal disease, stage 3, moderately decreased glomerular filtration rate (GFR) between 30-59 mL/min/1.73 square meter ([place]) 11/21/2018  . GERD without esophagitis 07/22/2017  . Depression   . Type 2 diabetes mellitus with peripheral neuropathy ([name])   . OAB (overactive bladder) 11/26/2014  . Routine general medical examination at a health care facility 04/06/2014  . Vitamin D deficiency 07/23/2010  . Obstructive sleep apnea 09/13/2008  . Hyperlipidemia with target LDL less than 100 07/27/2007  . BPH (benign prostatic hyperplasia) 06/21/2007  . 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 B12 deficiency anemia 04/15/2006  . Essential hypertension 04/15/2006  . Allergic rhinitis 04/15/2006   Past Medical History:  Past Medical History:  Diagnosis Date  . Allergy    Rhinitis  . Anemia    NOS iron deficient and B12 deficient  . Arthritis   . 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 Diabetes mellitus    Type 2  . GERD (gastroesophageal reflux disease)   . Hyperlipidemia   . Hypertension   . 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 OSA (obstructive sleep apnea)    cpap  . TBI (traumatic brain injury) [facility])    Past Surgical History:  Past Surgical History:  Procedure Laterality Date  . APPENDECTOMY    . arm  fracture Left 2011   with hardware  . BURR HOLE Bilateral 08/04/2017   Procedure: Haskell Flirt;  Surgeon: [name], MD;  Location: [place];  Service: Neurosurgery;  Laterality: Bilateral;  . COLONOSCOPY    . CRANIOTOMY Left 12/01/2017   Procedure: Trudee Kuster Holes CRANIOTOMY HEMATOMA EVACUATION SUBDURAL;  Surgeon: [name], MD;  Location: [place];  Service: Neurosurgery;  Laterality: Left;  . ESOPHAGOGASTRODUODENOSCOPY  2006   gastritis  . IR PERCUTANEOUS ART THROMBECTOMY/INFUSION INTRACRANIAL INC DIAG ANGIO  09/09/2020  . PACEMAKER IMPLANT N/A 05/14/2020   Procedure: PACEMAKER IMPLANT;  Surgeon: [name], MD;  Location: [place] [facility];  Service: Cardiovascular;  Laterality: N/A;  . RADIOLOGY WITH ANESTHESIA N/A 09/09/2020   Procedure: IR WITH ANESTHESIA;  Surgeon: Radiologist, Medication, MD;  Location: [place];  Service: Radiology;  Laterality: N/A;  . ROTATOR CUFF REPAIR    . SEPTOPLASTY  1959   Deviated Septum  . 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 MRI negative. Pt has a history of dyspahgia due to decreased mentation and prior CVA. Awareness of intake impaired in March, recommended thickened liquid and purees because of this with good potential to upgrade.  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 P   Assessment / Plan / Recommendation Clinical Impression  Pt demonstrates altered mentation with lethargy impacting cognitive function. Pt is mildly dysarthric, but with more stimuli to fully wake up his speech improves. He is able  to state orientation to self and situation, but then asks me if id like a glass of wine. His langauge function is adequate and he can complete basic reasoning tasks when he is awake. But he easily drifts off and perseverates on prior conversation. Expect pt to return to baseline with further medical management. Will f/u briefly while admitted.     SLP Assessment  SLP Recommendation/Assessment: Patient needs continued Speech Lanaguage Pathology Services SLP Visit Diagnosis: Cognitive communication deficit (R41.841)    Follow Up Recommendations  24 hour supervision/assistance    Frequency and Duration min 2x/week  1 week      SLP Evaluation Cognition  Overall Cognitive Status: Impaired/Different from baseline Arousal/Alertness: Lethargic Orientation Level: Oriented to person;Disoriented to time;Oriented to situation;Disoriented to place Attention: Focused;Sustained Focused Attention: Appears intact Sustained Attention: Impaired Sustained Attention Impairment: Verbal basic Memory: Impaired Memory Impairment: Decreased short term memory Decreased Short Term Memory: Verbal basic Awareness: Impaired Awareness Impairment: Intellectual impairment Problem Solving: Impaired Problem Solving Impairment: Verbal basic;Functional basic       Comprehension  Auditory Comprehension Overall Auditory Comprehension: Appears within functional limits for tasks assessed Yes/No Questions: Within Functional Limits Commands: Impaired One Step Basic Commands: 50-74% accurate Reading Comprehension Reading Status: Not tested    Expression Expression Primary Mode of Expression: Verbal Verbal Expression Overall Verbal Expression: Appears within functional limits for tasks assessed   Oral / Motor  Oral Motor/Sensory Function Overall Oral Motor/Sensory Function: Within functional limits Motor Speech Overall Motor Speech: Impaired Respiration: Impaired Level of Impairment: Conversation Phonation: Low vocal intensity Resonance: Within functional limits Articulation: Impaired Level of Impairment: Geographical information systems officer Planning: Witnin Education officer, environmental Errors: Inconsistent   GO                   [name], MA CCC-SLP  Acute Rehabilitation Services Pager [phone] Office [phone]  [name] 11/19/2020, 1:34 PM

## 2020-11-19 NOTE — Progress Notes (Signed)
Patient was supposed to have home unit but wife was unable to bring in today. Patient declined hospital CPAP. No unit in room at this time.

## 2020-11-19 NOTE — Plan of Care (Signed)
No acute events Due scheduled meds given  Neuro status intact, no changes Will continue to monitor and follow plan of care   Problem: Education: Goal: Knowledge of disease or condition will improve Outcome: Progressing   Problem: Coping: Goal: Will verbalize positive feelings about self Outcome: Progressing   Problem: Health Behavior/Discharge Planning: Goal: Ability to manage health-related needs will improve Outcome: Progressing   Problem: Education: Goal: Knowledge of General Education information will improve Description: Including pain rating scale, medication(s)/side effects and non-pharmacologic comfort measures Outcome: Progressing   Problem: Health Behavior/Discharge Planning: Goal: Ability to manage health-related needs will improve Outcome: Progressing   Problem: Clinical Measurements: Goal: Ability to maintain clinical measurements within normal limits will improve Outcome: Progressing Goal: Will remain free from infection Outcome: Progressing Goal: Diagnostic test results will improve Outcome: Progressing Goal: Respiratory complications will improve Outcome: Progressing Goal: Cardiovascular complication will be avoided Outcome: Progressing   Problem: Activity: Goal: Risk for activity intolerance will decrease Outcome: Progressing   Problem: Nutrition: Goal: Adequate nutrition will be maintained Outcome: Progressing   Problem: Coping: Goal: Level of anxiety will decrease Outcome: Progressing   Problem: Elimination: Goal: Will not experience complications related to bowel motility Outcome: Progressing Goal: Will not experience complications related to urinary retention Outcome: Progressing   Problem: Pain Managment: Goal: General experience of comfort will improve Outcome: Progressing   Problem: Safety: Goal: Ability to remain free from injury will improve Outcome: Progressing   Problem: Skin Integrity: Goal: Risk for impaired skin integrity  will decrease Outcome: Progressing

## 2020-11-19 NOTE — Plan of Care (Signed)
  Problem: Education: Goal: Knowledge of disease or condition will improve Outcome: Progressing   Problem: Coping: Goal: Will verbalize positive feelings about self Outcome: Progressing   Problem: Health Behavior/Discharge Planning: Goal: Ability to manage health-related needs will improve Outcome: Progressing   Problem: Education: Goal: Knowledge of General Education information will improve Description: Including pain rating scale, medication(s)/side effects and non-pharmacologic comfort measures Outcome: Progressing   Problem: Health Behavior/Discharge Planning: Goal: Ability to manage health-related needs will improve Outcome: Progressing   Problem: Clinical Measurements: Goal: Ability to maintain clinical measurements within normal limits will improve Outcome: Progressing Goal: Will remain free from infection Outcome: Progressing Goal: Diagnostic test results will improve Outcome: Progressing Goal: Respiratory complications will improve Outcome: Progressing Goal: Cardiovascular complication will be avoided Outcome: Progressing   Problem: Activity: Goal: Risk for activity intolerance will decrease Outcome: Progressing   Problem: Nutrition: Goal: Adequate nutrition will be maintained Outcome: Progressing   Problem: Coping: Goal: Level of anxiety will decrease Outcome: Progressing   Problem: Elimination: Goal: Will not experience complications related to bowel motility Outcome: Progressing Goal: Will not experience complications related to urinary retention Outcome: Progressing   Problem: Pain Managment: Goal: General experience of comfort will improve Outcome: Progressing   Problem: Safety: Goal: Ability to remain free from injury will improve Outcome: Progressing   Problem: Skin Integrity: Goal: Risk for impaired skin integrity will decrease Outcome: Progressing

## 2020-11-20 ENCOUNTER — Inpatient Hospital Stay (HOSPITAL_COMMUNITY): Payer: Medicare Other

## 2020-11-20 DIAGNOSIS — Z66 Do not resuscitate: Secondary | ICD-10-CM | POA: Diagnosis not present

## 2020-11-20 DIAGNOSIS — Z515 Encounter for palliative care: Secondary | ICD-10-CM | POA: Diagnosis not present

## 2020-11-20 DIAGNOSIS — N390 Urinary tract infection, site not specified: Secondary | ICD-10-CM

## 2020-11-20 DIAGNOSIS — Z7189 Other specified counseling: Secondary | ICD-10-CM | POA: Diagnosis not present

## 2020-11-20 DIAGNOSIS — I63512 Cerebral infarction due to unspecified occlusion or stenosis of left middle cerebral artery: Secondary | ICD-10-CM | POA: Diagnosis not present

## 2020-11-20 LAB — CBC
HCT: 31.8 % — ABNORMAL LOW (ref 39.0–52.0)
Hemoglobin: 9.9 g/dL — ABNORMAL LOW (ref 13.0–17.0)
MCH: 26.4 pg (ref 26.0–34.0)
MCHC: 31.1 g/dL (ref 30.0–36.0)
MCV: 84.8 fL (ref 80.0–100.0)
Platelets: 183 10*3/uL (ref 150–400)
RBC: 3.75 MIL/uL — ABNORMAL LOW (ref 4.22–5.81)
RDW: 13.6 % (ref 11.5–15.5)
WBC: 6.1 10*3/uL (ref 4.0–10.5)
nRBC: 0 % (ref 0.0–0.2)

## 2020-11-20 LAB — URINE CULTURE: Culture: >=100000 — AB

## 2020-11-20 LAB — GLUCOSE, CAPILLARY
Glucose-Capillary: 137 mg/dL — ABNORMAL HIGH (ref 70–99)
Glucose-Capillary: 252 mg/dL — ABNORMAL HIGH (ref 70–99)
Glucose-Capillary: 152 mg/dL — ABNORMAL HIGH (ref 70–99)
Glucose-Capillary: 137 mg/dL — ABNORMAL HIGH (ref 70–99)

## 2020-11-20 LAB — BASIC METABOLIC PANEL
Anion gap: 6 (ref 5–15)
BUN: 24 mg/dL — ABNORMAL HIGH (ref 8–23)
CO2: 27 mmol/L (ref 22–32)
Calcium: 8.8 mg/dL — ABNORMAL LOW (ref 8.9–10.3)
Chloride: 106 mmol/L (ref 98–111)
Creatinine, Ser: 1.38 mg/dL — ABNORMAL HIGH (ref 0.61–1.24)
GFR, Estimated: 51 mL/min — ABNORMAL LOW (ref >60)
Glucose, Bld: 128 mg/dL — ABNORMAL HIGH (ref 70–99)
Potassium: 3.7 mmol/L (ref 3.5–5.1)
Sodium: 139 mmol/L (ref 135–145)

## 2020-11-20 MED ORDER — CEPHALEXIN 500 MG PO CAPS
500.0000 mg | ORAL_CAPSULE | Freq: Three times a day (TID) | ORAL | Status: AC
  Administered 2020-11-20 – 2020-11-24 (×12): 500 mg via ORAL
  Filled 2020-11-20 (×12): qty 1

## 2020-11-20 NOTE — TOC Initial Note (Signed)
Transition of Care [facility]) - Initial/Assessment Note    Patient Details  Name: Frank [name] MRN: [id number] Date of Birth: [birth year]  [facility]) CM/SW Contact:    Geralynn Ochs, LCSW Phone Number: 11/20/2020, 10:39 AM  Clinical Narrative:      CSW met with patient's wife at bedside to start discussions of discharge plans and recommendation for SNF. Per wife, she said the patient had been doing really well at home but she is very concerned about how he looks today and was asking for a palliative consult. CSW asked about SNF, and patient's wife said she'd only be interested if the medical team would provide some assurance that he would get better. CSW asked MD for palliative consult to evaluate prognosis and establish goals of care. CSW to follow.             Expected Discharge Plan: Home w Hospice Care Barriers to Discharge: Continued Medical Work up,Equipment Delay   Patient Goals and CMS Choice Patient states their goals for this hospitalization and ongoing recovery are:: patient unable to participate in goal setting, not fully oriented CMS Medicare.gov Compare Post Acute Care list provided to:: Patient Represenative (must comment) Choice offered to / list presented to : Spouse  Expected Discharge Plan and Services Expected Discharge Plan: [place] Acute Care Choice: NA Living arrangements for the past 2 months: Single Family Home                                      Prior Living Arrangements/Services Living arrangements for the past 2 months: Single Family Home Lives with:: Spouse Patient language and need for interpreter reviewed:: No Do you feel safe going back to the place where you live?: Yes      Need for Family Participation in Patient Care: Yes (Comment) Care giver support system in place?: Yes (comment) Current home services: DME Criminal Activity/Legal Involvement Pertinent to Current Situation/Hospitalization:  No - Comment as needed  Activities of Daily Living Home Assistive Devices/Equipment: Shower chair with back,CPAP ADL Screening (condition at time of admission) Patient's cognitive ability adequate to safely complete daily activities?: Yes Is the patient deaf or have difficulty hearing?: Yes Does the patient have difficulty seeing, even when wearing glasses/contacts?: Yes Does the patient have difficulty concentrating, remembering, or making decisions?: Yes Patient able to express need for assistance with ADLs?: Yes Does the patient have difficulty dressing or bathing?: Yes Independently performs ADLs?: No Does the patient have difficulty walking or climbing stairs?: Yes Weakness of Legs: Both Weakness of Arms/Hands: Both  Permission Sought/Granted Permission sought to share information with : Family Supports Permission granted to share information with : Yes, Verbal Permission Granted  Share Information with NAME: [name]     Permission granted to share info w Relationship: Spouse     Emotional Assessment Appearance:: Appears stated age Attitude/Demeanor/Rapport: Unable to Assess Affect (typically observed): Unable to Assess Orientation: : Oriented to Self,Oriented to Place Alcohol / Substance Use: Not Applicable Psych Involvement: No (comment)  Admission diagnosis:  Cough [R05.9] Stroke (cerebrum) [facility]) [I63.9] Patient Active Problem List   Diagnosis Date Noted  . Simple chronic bronchitis ([place]) 11/13/2020  . Iron deficiency anemia secondary to inadequate dietary iron intake 11/06/2020  . DNR (do not resuscitate) discussion 10/17/2020  . Stroke (cerebrum) ([place]) 09/09/2020  . 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 Chronic renal disease, stage 3, moderately decreased glomerular filtration rate (GFR) between 30-59 mL/min/1.73 square  meter ([place]) 11/21/2018  . GERD without esophagitis 07/22/2017  . Depression   . Type 2 diabetes mellitus with peripheral neuropathy ([name])   . OAB (overactive bladder) 11/26/2014  . Routine general medical examination at a health care facility 04/06/2014  . Vitamin D deficiency 07/23/2010  . Obstructive sleep apnea 09/13/2008  . Hyperlipidemia with target LDL less than 100 07/27/2007  . BPH (benign prostatic hyperplasia) 06/21/2007  . 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 B12 deficiency anemia 04/15/2006  . Essential hypertension 04/15/2006  . Allergic rhinitis 04/15/2006   PCP:  Janith Lima, MD Pharmacy:   [place], [address] [place] [address] Phone: [phone] Fax: [phone]  [facility] ([place], [place] [address] Phone: [phone] Fax: [place], [address], [address] [place], [place] [address] Phone: [phone] Fax: [place], [address] [place] [address] Phone: [phone] Fax: [phone]     Social Determinants of Health (SDOH) Interventions    Readmission Risk Interventions No flowsheet data found.

## 2020-11-20 NOTE — Procedures (Signed)
Patient Name: Frank [name]  MRN: [id number]  Epilepsy Attending: Lora Havens  Referring Physician/Provider: Dr Bonnielee Haff Date: 11/20/2020 Duration: 24 mins  Patient history: 80yo M with prior R MCA stroke, now with  Hallucinations and fall. EEG to evaluate for seizure  Level of alertness: Awake, asleep  AEDs during EEG study: LEV  Technical aspects: This EEG study was done with scalp electrodes positioned according to the 10-20 International system of electrode placement. Electrical activity was acquired at a sampling rate of 500Hz  and reviewed with a high frequency filter of 70Hz  and a low frequency filter of 1Hz . EEG data were recorded continuously and digitally stored.   Description: The posterior dominant rhythm consists of 8 Hz activity of moderate voltage (25-35 uV) seen predominantly in posterior head regions, asymmetric ( R<l) and reactive to eye opening and eye closing. Sleep was characterized by vertex waves, sleep spindles (12 to 14 Hz), maximal frontocentral region.  EEG showed continuous 5 to 8 Hz theta-alpha activity in left hemisphere admixed with 15 to 18 Hz beta activity in left central temporal region consistent with breach artifact.  There is also 3 to 5 Hz theta-delta slowing in right hemisphere. Sharp transients were seen in left centro-parietal region region.  Hyperventilation and photic stimulation were not performed.     ABNORMALITY - Continuous slow, generalized and lateralized right hemisphere - Breach artifact, left centro- temporal region  IMPRESSION: This study is suggestive of cortical dysfunction in right hemisphere likely secondary to underlying encephalomalacia.  There is also evidence of breach artifact in left centro- temporal region consistent with prior craniotomy.  Additionally there is evidence of mild diffuse encephalopathy, nonspecific etiology.  No seizures or definite epileptiform discharges were seen throughout the  recording.  Cindi Ghazarian Barbra Sarks

## 2020-11-20 NOTE — Progress Notes (Signed)
EEG complete - results pending 

## 2020-11-20 NOTE — NC FL2 (Addendum)
[place] MEDICAID FL2 LEVEL OF CARE SCREENING TOOL     IDENTIFICATION  Patient Name: Frank [name] Birthdate: 1941/07/08 Sex: male Admission Date (Current Location): 11/18/2020  [place] and [place] Number:  Herbalist and Address:  [facility], [place] [address] [place], [place] [address]      Provider Number: [id number]  Attending Physician Name and Address:  Bonnielee Haff, MD  Relative Name and Phone Number:  [name] (spouse) [phone]    Current Level of Care: Hospital Recommended Level of Care: [place] Prior Approval Number:    Date Approved/Denied:   PASRR Number:   [id number]  Discharge Plan: SNF    Current Diagnoses: Patient Active Problem List   Diagnosis Date Noted  . Simple chronic bronchitis ([place]) 11/13/2020  . Iron deficiency anemia secondary to inadequate dietary iron intake 11/06/2020  . DNR (do not resuscitate) discussion 10/17/2020  . Stroke (cerebrum) ([place]) 09/09/2020  . 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 Chronic renal disease, stage 3, moderately decreased glomerular filtration rate (GFR) between 30-59 mL/min/1.73 square meter ([place]) 11/21/2018  . GERD without esophagitis 07/22/2017  . Depression   . Type 2 diabetes mellitus with peripheral neuropathy ([name])   . OAB (overactive bladder) 11/26/2014  . Routine general medical examination at a health care facility 04/06/2014  . Vitamin D deficiency 07/23/2010  . Obstructive sleep apnea 09/13/2008  . Hyperlipidemia with target LDL less than 100 07/27/2007  . BPH (benign prostatic hyperplasia) 06/21/2007  . 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 B12 deficiency anemia 04/15/2006  . Essential hypertension 04/15/2006  . Allergic rhinitis 04/15/2006    Orientation RESPIRATION BLADDER Height &  Weight     Self,Time,Situation,Place  O2 Incontinent,External catheter Weight: 205 lb (93 kg) Height:  5\' 9"  (175.3 cm)  BEHAVIORAL SYMPTOMS/MOOD NEUROLOGICAL BOWEL NUTRITION STATUS      Continent Diet (see discharge summary)  AMBULATORY STATUS COMMUNICATION OF NEEDS Skin   Extensive Assist Verbally Other (Comment),Skin abrasions (abrasion left knee)                       Personal Care Assistance Level of Assistance  Bathing,Feeding,Dressing,Total care Bathing Assistance: Limited assistance Feeding assistance: Independent Dressing Assistance: Limited assistance Total Care Assistance: Limited assistance   Functional Limitations Info  Sight,Hearing,Speech Sight Info: Impaired Hearing Info: Adequate Speech Info: Adequate    SPECIAL CARE FACTORS FREQUENCY  PT (By licensed PT),OT (By licensed OT)     PT Frequency: min 5x weekly OT Frequency: min 5x weekly            Contractures Contractures Info: Not present    Additional Factors Info  Code Status,Allergies Code Status Info: DNR Allergies Info: lisinopril, invokana (canagliflozin), penicillins, sulfamethoxazole           Current Medications (11/20/2020):  This is the current hospital active medication list Current Facility-Administered Medications  Medication Dose Route Frequency Provider Last Rate Last Admin  .  stroke: mapping our early stages of recovery book   Does not apply Once Wynetta Fines T, MD      . acetaminophen (TYLENOL) tablet 650 mg  650 mg Oral Q4H PRN Lequita Halt, MD       Or  . acetaminophen (TYLENOL) 160 MG/5ML solution 650 mg  650 mg Per Tube Q4H PRN Lequita Halt, MD  Or  . acetaminophen (TYLENOL) suppository 650 mg  650 mg Rectal Q4H PRN Wynetta Fines T, MD      . aspirin EC tablet 81 mg  81 mg Oral Daily Gardiner Barefoot, NP   81 mg at 11/20/20 0846  . atorvastatin (LIPITOR) tablet 10 mg  10 mg Oral Daily Wynetta Fines T, MD   10 mg at 11/20/20 0846  . cephALEXin (KEFLEX) capsule  500 mg  500 mg Oral Q8H Bonnielee Haff, MD   500 mg at 11/20/20 1500  . cloNIDine (CATAPRES - Dosed in mg/24 hr) patch 0.1 mg  0.1 mg Transdermal Q Heriberto Antigua T, MD   0.1 mg at 11/20/20 0849  . darifenacin (ENABLEX) 24 hr tablet 7.5 mg  7.5 mg Oral Daily Wynetta Fines T, MD   7.5 mg at 11/20/20 0847  . ferrous sulfate tablet 325 mg  325 mg Oral Q breakfast Wynetta Fines T, MD   325 mg at 11/20/20 0846  . FLUoxetine (PROZAC) capsule 20 mg  20 mg Oral Daily Wynetta Fines T, MD   20 mg at 11/20/20 0846  . heparin injection 5,000 Units  5,000 Units Subcutaneous Q12H Wynetta Fines T, MD   5,000 Units at 11/20/20 0846  . hydrALAZINE (APRESOLINE) injection 5 mg  5 mg Intravenous Q6H PRN Wynetta Fines T, MD      . insulin aspart (novoLOG) injection 0-9 Units  0-9 Units Subcutaneous TID WC Lequita Halt, MD   5 Units at 11/20/20 1239  . insulin glargine (LANTUS) injection 10 Units  10 Units Subcutaneous Daily Lequita Halt, MD   10 Units at 11/20/20 0847  . levETIRAcetam (KEPPRA) 100 MG/ML solution 500 mg  500 mg Oral BID Wynetta Fines T, MD   500 mg at 11/20/20 0849  . loratadine (CLARITIN) tablet 10 mg  10 mg Oral QPM Wynetta Fines T, MD   10 mg at 11/19/20 1647  . pantoprazole (PROTONIX) EC tablet 40 mg  40 mg Oral Daily Wynetta Fines T, MD   40 mg at 11/20/20 0846  . senna-docusate (Senokot-S) tablet 1 tablet  1 tablet Oral QHS PRN Wynetta Fines T, MD      . ticagrelor [facility]) tablet 45 mg  45 mg Oral BID Gardiner Barefoot, NP   45 mg at 11/20/20 1610     Discharge Medications: Please see discharge summary for a list of discharge medications.  Relevant Imaging Results:  Relevant Lab Results:   Additional Information SSN: [id number]  Alberteen Sam, LCSW

## 2020-11-20 NOTE — Progress Notes (Signed)
Pt home cpap set up, pt to place self on cpap when ready.

## 2020-11-20 NOTE — Consult Note (Signed)
Consultation Note Date: 11/20/2020   Patient Name: Frank [name]  DOB: 1941/04/15  MRN: [id number]  Age / Sex: 80 y.o., male  PCP: Janith Lima, MD Referring Physician: Bonnielee Haff, MD  Reason for Consultation: Establishing goals of care  HPI/Patient Profile: 80 y.o. 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  Neurology feels symptoms more likely recrudescence of prior stroke related to UTI, dehydration and missing Brilinta.  Patient now more alert - passed SLP eval - on regular diet and thin liquids. PMT consulted to discuss [place].   Clinical Assessment and Goals of Care: I have reviewed medical records including EPIC notes, labs and imaging, received report from RN, assessed the patient and then spoke with patient and wife to discuss diagnosis prognosis, GOC, EOL wishes, disposition and options.  I introduced Palliative Medicine as specialized medical care for people living with serious illness. It focuses on providing relief from the symptoms and stress of a serious illness. The goal is to improve quality of life for both the patient and the family.  We discussed a brief life review of the patient. Patient tells me he has been married to his wife 31 years.   As far as functional  and nutritional status, they speak of decline since multiple serious medical events since 2018.    We discussed patient's current illness and what it means in the larger context of patient's on-going co-morbidities. I attempted to elicit values and goals of care important to the patient.    Patient and wife are hopeful for a rehab stay with some improvement in fucntional status.   Advance directives, concepts specific to code status, artificial feeding and hydration, and rehospitalization were considered and discussed.   I completed a MOST form today. The patient and family outlined their wishes for the following treatment decisions:  Cardiopulmonary Resuscitation: Do Not Attempt Resuscitation (DNR/No CPR)  Medical Interventions: Limited Additional Interventions: Use medical treatment, IV fluids and cardiac monitoring as indicated, DO NOT USE intubation or mechanical ventilation. May consider use of less invasive airway support such as BiPAP or CPAP. Also provide comfort measures. Transfer to the hospital if indicated. Avoid intensive care.   Antibiotics: Antibiotics if indicated  IV Fluids: IV fluids if indicated  Feeding Tube: No feeding tube   Discussed with patient/family the importance of continued conversation with family and the medical providers regarding overall plan of care and treatment options, ensuring decisions are within the context of the patient's values and GOCs.    Palliative Care services outpatient were explained and offered. Patient and wife already connected to outpatient palliative services.  Questions and concerns were addressed. The family was encouraged to call with questions or concerns.   Primary Decision Maker PATIENT joined by wife    SUMMARY OF RECOMMENDATIONS   - MOST completed as above - DNR - SNF rehab with outpatient palliative to follow  Code Status/Advance Care Planning:  DNR  Prognosis:   Unable to determine  Discharge Planning: [place] for rehab with Palliative care service follow-up      Primary Diagnoses: Present on Admission: . Stroke (cerebrum) ([place])   I have reviewed the medical record, interviewed the patient and family, and examined the patient. The following aspects are pertinent.  Past Medical History:  Diagnosis Date  . Allergy    Rhinitis  . Anemia    NOS iron deficient and B12 deficient  . Arthritis   . 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 Diabetes mellitus    Type 2  . GERD (gastroesophageal reflux disease)   . Hyperlipidemia   . Hypertension   . 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 OSA (obstructive sleep apnea)    cpap  . TBI (traumatic brain injury) [facility])    Social History   Socioeconomic History  . Marital status: Married    Spouse name: Not on file  . Number of children: 1  . Years of education: Not on file  . Highest education level: Not on file  Occupational History  . Not on file  Tobacco Use  . Smoking status: Former Smoker    Packs/day: 1.00    Quit date: 07/14/1983    Years since quitting: 37.3  . Smokeless tobacco: Never Used  Vaping Use  . Vaping Use: Never used  Substance and Sexual Activity  . Alcohol use: Yes    Alcohol/week: 2.0 standard drinks    Types: 2 Standard drinks or equivalent per week    Comment: occassionally  . Drug use: No  . Sexual activity: Never  Other Topics Concern  . Not on file  Social History Narrative  . Not on file   Social Determinants of Health   Financial Resource Strain: Low Risk   . Difficulty of Paying Living Expenses: Not hard at all  Food Insecurity: Not on file  Transportation Needs: Not on file  Physical Activity: Not on file  Stress: Not on file  Social Connections: Not on file   Family History  Problem Relation Age of Onset  . Heart disease Mother   . Breast cancer Mother   . Stroke Father   . Allergies Father        Father and children  . Coronary artery disease Brother   . Diabetes Neg Hx    . Colon cancer Neg Hx    Scheduled Meds: .  stroke: mapping our early stages of recovery book   Does not apply Once  . aspirin EC  81 mg Oral Daily  . atorvastatin  10 mg Oral Daily  . cephALEXin  500 mg Oral Q8H  . cloNIDine  0.1 mg Transdermal Q Wed  . darifenacin  7.5 mg Oral Daily  . ferrous sulfate  325 mg Oral Q breakfast  . FLUoxetine  20 mg Oral Daily  . heparin  5,000 Units Subcutaneous Q12H  . insulin aspart  0-9 Units Subcutaneous TID WC  . insulin glargine  10 Units Subcutaneous Daily  . levETIRAcetam  500 mg Oral BID  . loratadine  10 mg Oral QPM  . pantoprazole  40 mg Oral Daily  . ticagrelor  45 mg Oral BID   Continuous Infusions: PRN Meds:.acetaminophen **OR** acetaminophen (TYLENOL) oral liquid 160 mg/5 mL **OR** acetaminophen, hydrALAZINE, senna-docusate Allergies  Allergen Reactions  . Lisinopril Cough  . Invokana [Canagliflozin] Other (See Comments)    Stopped by MD  . Penicillins Other (See Comments)  Allergic as a child.  Patient does not remember reaction.  Has had cephalasporins without problems.  . Sulfamethoxazole Other (See Comments)    Unknown reaction- from childhood   Review of Systems  Constitutional: Positive for activity change.  Respiratory: Positive for cough.   Neurological: Positive for weakness.    Physical Exam Constitutional:      General: He is not in acute distress. Pulmonary:     Effort: Pulmonary effort is normal.  Skin:    General: Skin is warm and dry.  Neurological:     Mental Status: He is alert and oriented to person, place, and time.  Psychiatric:        Mood and Affect: Mood normal.        Behavior: Behavior normal.     Vital Signs: BP (!) 142/73 (BP Location: Right Arm)   Pulse 86   Temp 99.3 F (37.4 C) (Oral)   Resp (!) 24   Ht 5\' 9"  (1.753 m)   Wt 93 kg   SpO2 97%   BMI 30.27 kg/m  Pain Scale: 0-10 POSS *See Group Information*: 1-Acceptable,Awake and alert Pain Score: 0-No pain   SpO2: SpO2:  97 % O2 Device:SpO2: 97 % O2 Flow Rate: .   IO: Intake/output summary:   Intake/Output Summary (Last 24 hours) at 11/20/2020 1219 Last data filed at 11/20/2020 0900 Gross per 24 hour  Intake 2103.22 ml  Output --  Net 2103.22 ml    LBM: Last BM Date: 11/19/20 Baseline Weight: Weight: 93 kg Most recent weight: Weight: 93 kg     Palliative Assessment/Data: PPS 50%    Time Total: 60 minutes Greater than 50%  of this time was spent counseling and coordinating care related to the above assessment and plan.  Juel Burrow, DNP, AGNP-C Palliative Medicine Team [phone] Pager: [phone]

## 2020-11-20 NOTE — Progress Notes (Signed)
Physical Therapy Treatment Patient Details Name: Frank [name] MRN: [id number] DOB: 08-16-1940 Today's Date: 11/20/2020    History of Present Illness 80yo male admitted 11/18/20 presenting with c/o L LE weakness as well as multiple falls at home, confusion. Per neurology w/u, concern for TIA vs recrudescence of past CVA. 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 Patient continues to demonstrate posterior lean in standing with use of bracing knees against bed for stability. Patient requires modA+2 for OOB mobility with use of RW for balance and safety. Patient would benefit from intensive therapies with CIR to assist with return to PLOF and increase independence.    Follow Up Recommendations  CIR     Equipment Recommendations  None recommended by PT (defer to post acute rehab)    Recommendations for Other Services Rehab consult     Precautions / Restrictions Precautions Precautions: Fall;Other (comment) Precaution Comments: recent R MCA CVA/L weakness, posterior LOB, high fall risk Restrictions Weight Bearing Restrictions: No    Mobility  Bed Mobility Overal bed mobility: Needs Assistance Bed Mobility: Supine to Sit;Sit to Supine     Supine to sit: Min guard Sit to supine: Min guard   General bed mobility comments: min guard for safety, no physical assist required    Transfers Overall transfer level: Needs assistance Equipment used: Rolling Karyl Sharrar (2 wheeled) Transfers: Sit to/from Stand Sit to Stand: Mod assist;+2 physical assistance;+2 safety/equipment         General transfer comment: sit to stand x 3 with modA+2 for each rep. Knees braced against bed for stability  Ambulation/Gait Ambulation/Gait assistance: Mod assist;+2 safety/equipment Gait Distance (Feet): 4 Feet Assistive device: Rolling Dilraj Killgore (2 wheeled) Gait Pattern/deviations: Step-to pattern;Decreased stride  length;Decreased weight shift to right;Decreased weight shift to left;Leaning posteriorly;Narrow base of support Gait velocity: decreased   General Gait Details: modA+2 for balance and weightshifting L/R to advance feet to complete sidestepping at EOB. Patient with posterior lean throughout using bed to stability   Stairs             Wheelchair Mobility    Modified Rankin (Stroke Patients Only)       Balance Overall balance assessment: History of Falls Sitting-balance support: Bilateral upper extremity supported;Feet supported Sitting balance-Leahy Scale: Fair   Postural control: Posterior lean Standing balance support: Bilateral upper extremity supported;During functional activity Standing balance-Leahy Scale: Poor Standing balance comment: ModA to maintain upright, posterior lean                            Cognition Arousal/Alertness: Awake/alert Behavior During Therapy: WFL for tasks assessed/performed Overall Cognitive Status: Within Functional Limits for tasks assessed                                 General Comments: history of TBI but seemed to follow cues/commands well; Rreduced awareness of deficits and safety but was able to verbalize need for rehab prior to returning home      Exercises      General Comments General comments (skin integrity, edema, etc.): Initially on 1L O2 [place] with spO2 94%. Removed [place] with patient able to maintain above 90% during activity. Per chart, patient on regular diet with thin liquids. Patient requesting water and provided to patient with straw. Upon sip, patient immediately began  coughing. Patient was able to spit up green sputum.      Pertinent Vitals/Pain Faces Pain Scale: No hurt Pain Intervention(s): Monitored during session    Home Living                      Prior Function            PT Goals (current goals can now be found in the care plan section) Acute Rehab PT Goals Patient  Stated Goal: Go home but understands rehab PT Goal Formulation: With patient/family Time For Goal Achievement: 12/02/20 Potential to Achieve Goals: Fair Progress towards PT goals: Progressing toward goals    Frequency    Min 3X/week      PT Plan Discharge plan needs to be updated    Co-evaluation              AM-PAC PT "6 Clicks" Mobility   Outcome Measure  Help needed turning from your back to your side while in a flat bed without using bedrails?: A Little Help needed moving from lying on your back to sitting on the side of a flat bed without using bedrails?: A Little Help needed moving to and from a bed to a chair (including a wheelchair)?: Total Help needed standing up from a chair using your arms (e.g., wheelchair or bedside chair)?: Total Help needed to walk in hospital room?: Total Help needed climbing 3-5 steps with a railing? : Total 6 Click Score: 10    End of Session Equipment Utilized During Treatment: Gait belt Activity Tolerance: Patient tolerated treatment well Patient left: in bed;with call bell/phone within reach;with bed alarm set Nurse Communication: Mobility status PT Visit Diagnosis: Unsteadiness on feet (R26.81);Difficulty in walking, not elsewhere classified (R26.2);Repeated falls (R29.6);Muscle weakness (generalized) (M62.81)     Time: 2947-6546 PT Time Calculation (min) (ACUTE ONLY): 32 min  Charges:  $Therapeutic Activity: 23-37 mins                     Benjermin Korber A. Gilford Rile PT, DPT Acute Rehabilitation Services Pager [phone] Office [phone]    Linna Hoff 11/20/2020, 2:26 PM

## 2020-11-20 NOTE — Consult Note (Signed)
   [facility] CM Inpatient Consult   11/20/2020  Mcdaniel Ohms 1941/06/12 [id number]  [place] Organization [ACO] Patient: [employer]  Patient screened for hospitalization with notedhigh risk score for unplanned readmission risk and to assess for potential [place] Management service needs for post hospital transition.  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 Electronic medical record review reveals patient alert and oriented.  Spoke with patient at bedside.  Patient active in [facility] office, he states his wife handles appointments and all states, "she is my life." Explained to patient regarding follow up with [facility] primary care for pharmacy and nursing for chronic care management. He states he is very pleased  with the office and [place] in general. Therapy came to work with patient.  Plan:  Continue to follow progress and disposition to assess for post hospital care management needs.  Patient given a [facility] brochure and 24 hour nurse line to share with wife. Will follow for post hospital chronic care management needs.  For questions contact:   Natividad Brood, RN BSN [facility] Liaison  [phone] business mobile phone Toll free office [phone]  Fax number: [phone] [email or web address]

## 2020-11-20 NOTE — Progress Notes (Signed)
  Speech Language Pathology Treatment: Dysphagia  Patient Details Name: Stephone Gum MRN: [id number] DOB: [birth year] Today's Date: 11/20/2020 Time: 2836-6294 SLP Time Calculation (min) (ACUTE ONLY): 8 min  Assessment / Plan / Recommendation Clinical Impression  Pt demonstrates resolution of signs of aspiration today with thin liquids and solids; he is fully alert, oriented and self feeding. Will resume regular diet and thin liquids. SIGn off.   HPI HPI: 80yo male admitted 11/18/20 presenting with c/o L LE weakness as well as multiple falls at home, confusion. MRI negative. Pt has a history of dyspahgia due to decreased mentation and prior CVA. Awareness of intake impaired in March, recommended thickened liquid and purees because of this with good potential to upgrade.  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 P      SLP Plan  All goals met       Recommendations  Diet recommendations: Regular;Thin liquid Liquids provided via: Cup;Straw                Follow up Recommendations: 24 hour supervision/assistance SLP Visit Diagnosis: Dysphagia, unspecified (R13.10) Plan: All goals met       GO                , Katherene Ponto 11/20/2020, 9:13 AM

## 2020-11-20 NOTE — TOC Initial Note (Signed)
Transition of Care [facility]) - Initial/Assessment Note    Patient Details  Name: Frank [name] MRN: [id number] Date of Birth: Dec 31, 1940  [facility]) CM/SW Contact:    Alberteen Sam, LCSW Phone Number: 11/20/2020, 3:50 PM  Clinical Narrative:                  CSW spoke with patient's spouse [name] regarding discharge planning. She reports being in agreement with patient going to SNF, they have preference of [facility] in [facility] as he has been there before and she reports they enjoyed the facility.   CSW has sent referral in hub bed offer pending at this time. Patient's spouse reports back up option will be [place] if unable to get into [facility].     Expected Discharge Plan: Skilled Nursing Facility Barriers to Discharge: Continued Medical Work up   Patient Goals and CMS Choice Patient states their goals for this hospitalization and ongoing recovery are:: to go to SNF CMS Medicare.gov Compare Post Acute Care list provided to:: Patient Represenative (must comment) (daughter [name]) Choice offered to / list presented to : Spouse  Expected Discharge Plan and Services Expected Discharge Plan: [place] Acute Care Choice: [place] Living arrangements for the past 2 months: Single Family Home                                      Prior Living Arrangements/Services Living arrangements for the past 2 months: Single Family Home Lives with:: Self Patient language and need for interpreter reviewed:: No Do you feel safe going back to the place where you live?: No   needs SNF  Need for Family Participation in Patient Care: Yes (Comment) Care giver support system in place?: Yes (comment) Current home services: DME Criminal Activity/Legal Involvement Pertinent to Current Situation/Hospitalization: No - Comment as needed  Activities of Daily Living Home Assistive Devices/Equipment: Shower chair with back,CPAP ADL  Screening (condition at time of admission) Patient's cognitive ability adequate to safely complete daily activities?: Yes Is the patient deaf or have difficulty hearing?: Yes Does the patient have difficulty seeing, even when wearing glasses/contacts?: Yes Does the patient have difficulty concentrating, remembering, or making decisions?: Yes Patient able to express need for assistance with ADLs?: Yes Does the patient have difficulty dressing or bathing?: Yes Independently performs ADLs?: No Does the patient have difficulty walking or climbing stairs?: Yes Weakness of Legs: Both Weakness of Arms/Hands: Both  Permission Sought/Granted Permission sought to share information with : Case Manager,Facility Contact Representative,Family Supports Permission granted to share information with : Yes, Verbal Permission Granted  Share Information with NAME: [name]  Permission granted to share info w AGENCY: SNFs  Permission granted to share info w Relationship: spouse  Permission granted to share info w Contact Information: [phone]  Emotional Assessment Appearance:: Appears stated age Attitude/Demeanor/Rapport: Gracious Affect (typically observed): Calm Orientation: : Oriented to Self,Oriented to Place,Oriented to  Time,Oriented to Situation Alcohol / Substance Use: Not Applicable Psych Involvement: No (comment)  Admission diagnosis:  Cough [R05.9] Stroke (cerebrum) [facility]) [I63.9] Patient Active Problem List   Diagnosis Date Noted  . Simple chronic bronchitis ([place]) 11/13/2020  . Iron deficiency anemia secondary to inadequate dietary iron intake 11/06/2020  . DNR (do not resuscitate) discussion 10/17/2020  . Stroke (cerebrum) ([place]) 09/09/2020  . 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 Chronic renal disease, stage 3, moderately decreased glomerular  filtration rate (GFR) between 30-59 mL/min/1.73 square meter ([place]) 11/21/2018  . GERD without esophagitis 07/22/2017  . Depression   . Type 2 diabetes mellitus with peripheral neuropathy ([name])   . OAB (overactive bladder) 11/26/2014  . Routine general medical examination at a health care facility 04/06/2014  . Vitamin D deficiency 07/23/2010  . Obstructive sleep apnea 09/13/2008  . Hyperlipidemia with target LDL less than 100 07/27/2007  . BPH (benign prostatic hyperplasia) 06/21/2007  . 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 B12 deficiency anemia 04/15/2006  . Essential hypertension 04/15/2006  . Allergic rhinitis 04/15/2006   PCP:  Janith Lima, MD Pharmacy:   [place], [address] [place] [address] Phone: [phone] Fax: [phone]  [facility] ([place], [place] [address] Phone: [phone] Fax: [place], [address], [address] [place], [place] [address] Phone: [phone] Fax: [place], [address] [place] [address] Phone: [phone] Fax: [phone]     Social Determinants of Health (SDOH) Interventions    Readmission Risk Interventions No flowsheet data found.

## 2020-11-20 NOTE — Progress Notes (Signed)
Inpatient Rehab Admissions Coordinator Note:   Per therapy recommendations, pt was screened for CIR candidacy by Shann Medal, PT, DPT.  At this time it is felt that symptoms of LLE weakness resulting from recrudescence of prior CVA.  Pt does not have a diagnosis to qualify for CIR at this time.  Recommend f/u at an alternative level of care.  Please contact me with questions.   Shann Medal, PT, DPT [phone] 11/20/20 2:47 PM

## 2020-11-20 NOTE — Progress Notes (Signed)
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  He had been discharged on DAPT (aspirin and Brilinta), however he ran out of Brilinta 2 weeks PTA and unable to refill.  In the ED, code stroke was called.  Neurology evaluated, CTH, CTA with no new stroke, no LVO, suspecting TIA versus recrudescence due to infectious/metabolic abnormalities and less likely due to seizures.   Assessment & Plan:    Left leg weakness likely due to recrudescence of prior stroke due to UTI/stroke was ruled out. History as noted above.  Code stroke was activated in ED.  CT head and CTA head without new stroke and no large vessel occlusion but showed prior R MCA stroke.  MRI brain shows no acute intracranial abnormality, chronic right MCA territory infarct and chronic bilateral subdural hematomas, unchanged from 3/22.   EEG was recommended by neurology but it looks like the order was not placed.  We will place an order. Per neurology the presentation is likely due to TIA (primary diagnosis) versus recrudescence of prior strokes versus seizures (less likely).    TTE recently on 09/09/2020 with preserved LVEF and grade  1 diastolic dysfunction.  LDL 53.  A1c 7.7. TSH: 0.459.   Continuing aspirin 81 Mg daily and Brilinta 45 Mg twice daily.  Continue atorvastatin 10 Mg daily.  PT recommends SNF.  ST recommends dysphagia 2 and nectar thickened liquids.    Acute metabolic encephalopathy Likely related to acute cystitis complicating possible underlying vascular dementia and CKD.  No acute stroke on imaging.  UDS positive for opiates and recently started codeine cough syrup could certainly have contributed to his presentation and opioids should be avoided.  E. coli acute cystitis without hematuria: Urine microscopy suggestive of UTI and urine culture shows >100 K colonies of E. Coli.  Patient noted to be on ceftriaxone.  Sensitivities reviewed.  Changed to cephalexin.  Dysphagia Speech therapy evaluated and recommended dysphagia 2 diet and nectar thickened liquids.  Strict TIA precautions.  Transient hypoxia Chest x-ray showed bilateral atelectasis.  Concern regarding chronic aspiration.  Diet per speech therapy as noted above.  No clinical or radiological pneumonia at this time.    CKD stage IIIa Serum creatinine was in the 1.3-1.4 range in March.  However this had worsened to 1.7 in April.  Now presented with creatinine of 1.81 which has improved to 1.55.  Did receive IV contrast.  Improved to 1.38 today.    Type II DM with renal complications Mildly uncontrolled and fluctuating.  Continue Lantus, SSI and adjust insulins as needed.  OSA on nightly CPAP.  History of TBI Keppra resumed in March.  Essential hypertension Reasonably controlled.  Anemia in CKD Stable.  Goals of care Patient's spouse discussed with [facility] team and requesting  palliative care consult.  Requested.  Body mass index is 30.27 kg/m./Obesity    DVT prophylaxis: heparin injection 5,000 Units Start: 11/18/20 1400     Code Status: DNR Family Communication: No family at bedside.  We will call wife later today. Disposition: SNF  recommended by PT and OT.  Status is: Inpatient  Remains inpatient appropriate because:Inpatient level of care appropriate due to severity of illness   Dispo: The patient is from: Home              Anticipated d/c is to: SNF              Patient currently is not medically stable to d/c.   Difficult to place patient No        Consultants:   Neurology  Procedures:   None  Antimicrobials:    Anti-infectives (From admission, onward)   Start     Dose/Rate Route Frequency Ordered Stop   11/19/20 1800  cefTRIAXone (ROCEPHIN) 1 g in sodium chloride 0.9 % 100 mL IVPB        1 g 200 mL/hr over 30 Minutes Intravenous Every 24 hours 11/19/20 1706          Subjective:  Patient denies any complaints this morning.  He is smiling.  Denies any headaches.  Mildly distracted.  Objective:   Vitals:   11/19/20 1937 11/19/20 2325 11/20/20 0331 11/20/20 0736  BP: (!) 144/85 137/66 (!) 162/64 (!) 146/77  Pulse: 72 71 78 85  Resp: 20 20 (!) 21 (!) 21  Temp: 98.3 F (36.8 C) 98.2 F (36.8 C) 97.7 F (36.5 C) 98 F (36.7 C)  TempSrc: Oral Oral Oral Oral  SpO2: 96% 98% 90% 96%  Weight:      Height:         General appearance: Awake alert.  In no distress mildly distracted Resp: Clear to auscultation bilaterally.  Normal effort Cardio: S1-S2 is normal regular.  No S3-S4.  No rubs murmurs or bruit GI: Abdomen is soft.  Nontender nondistended.  Bowel sounds are present normal.  No masses organomegaly Extremities: No edema.  Moving all of his extremities Neurologic: Pleasantly confused. no focal neurological deficits.       Data Reviewed:   I have personally reviewed following labs and imaging studies   CBC: Recent Labs  Lab 11/18/20 0902 11/18/20 1002 11/20/20 0257  WBC 8.1  --  6.1  NEUTROABS 6.0  --   --   HGB 10.6* 9.9* 9.9*  HCT 34.5* 29.0* 31.8*  MCV 85.6  --  84.8  PLT 193  --  [id number]    Basic Metabolic Panel: Recent Labs  Lab 11/18/20 0902 11/18/20 1002  11/19/20 0324 11/20/20 0257  NA 136 135 137 139  K 4.1 4.1 4.0 3.7  CL 101 100 100 106  CO2 27  --  28 27  GLUCOSE 235* 213* 212* 128*  BUN 30* 31* 30* 24*  CREATININE 1.81* 1.60* 1.55* 1.38*  CALCIUM 9.1  --  9.2 8.8*    Liver Function Tests: Recent Labs  Lab 11/18/20 0902  AST 18  ALT 15  ALKPHOS 111  BILITOT 0.8  PROT 6.7  ALBUMIN 3.3*    CBG: Recent Labs  Lab 11/19/20 1631 11/19/20 2139 11/20/20 0611  GLUCAP 149* 110* 137*    Microbiology Studies:   Recent Results (from the past 240 hour(s))  Resp Panel by RT-PCR (Flu A&B, Covid) Nasopharyngeal Swab     Status: None   Collection  Time: 11/18/20  9:54 AM   Specimen: Nasopharyngeal Swab; Nasopharyngeal(NP) swabs in vial transport medium  Result Value Ref Range Status   SARS Coronavirus 2 by RT PCR NEGATIVE NEGATIVE Final    Comment: (NOTE) SARS-CoV-2 target nucleic acids are NOT DETECTED.  The SARS-CoV-2 RNA is generally detectable in upper respiratory specimens during the acute phase of infection. The lowest concentration of SARS-CoV-2 viral copies this assay can detect is 138 copies/mL. A negative result does not preclude SARS-Cov-2 infection and should not be used as the sole basis for treatment or other patient management decisions. A negative result may occur with  improper specimen collection/handling, submission of specimen other than nasopharyngeal swab, presence of viral mutation(s) within the areas targeted by this assay, and inadequate number of viral copies(<138 copies/mL). A negative result must be combined with clinical observations, patient history, and epidemiological information. The expected result is Negative.  Fact Sheet for Patients:  EntrepreneurPulse.com.au  Fact Sheet for Healthcare Providers:  IncredibleEmployment.be  This test is no t yet approved or cleared by the [place] FDA and  has been authorized for detection and/or diagnosis of  SARS-CoV-2 by FDA under an Emergency Use Authorization (EUA). This EUA will remain  in effect (meaning this test can be used) for the duration of the COVID-19 declaration under Section 564(b)(1) of the Act, 21 U.S.C.section 360bbb-3(b)(1), unless the authorization is terminated  or revoked sooner.       Influenza A by PCR NEGATIVE NEGATIVE Final   Influenza B by PCR NEGATIVE NEGATIVE Final    Comment: (NOTE) The Xpert Xpress SARS-CoV-2/FLU/RSV plus assay is intended as an aid in the diagnosis of influenza from Nasopharyngeal swab specimens and should not be used as a sole basis for treatment. Nasal washings and aspirates are unacceptable for Xpert Xpress SARS-CoV-2/FLU/RSV testing.  Fact Sheet for Patients: EntrepreneurPulse.com.au  Fact Sheet for Healthcare Providers: IncredibleEmployment.be  This test is not yet approved or cleared by the [place] FDA and has been authorized for detection and/or diagnosis of SARS-CoV-2 by FDA under an Emergency Use Authorization (EUA). This EUA will remain in effect (meaning this test can be used) for the duration of the COVID-19 declaration under Section 564(b)(1) of the Act, 21 U.S.C. section 360bbb-3(b)(1), unless the authorization is terminated or revoked.  Performed at [facility], [place] [address], [place], [place] [address]   Culture, Urine     Status: Abnormal   Collection Time: 11/18/20 11:56 AM   Specimen: Urine, Catheterized  Result Value Ref Range Status   Specimen Description URINE, CATHETERIZED  Final   Special Requests   Final    NONE Performed at [facility], [place] [address], [place], [place] [address]    Culture >=100,000 COLONIES/mL ESCHERICHIA COLI (A)  Final   Report Status 11/20/2020 FINAL  Final   Organism ID, Bacteria ESCHERICHIA COLI (A)  Final      Susceptibility   Escherichia coli - MIC*    AMPICILLIN >=32 RESISTANT Resistant     CEFAZOLIN <=4 SENSITIVE  Sensitive     CEFEPIME <=0.12 SENSITIVE Sensitive     CEFTRIAXONE <=0.25 SENSITIVE Sensitive     CIPROFLOXACIN <=0.25 SENSITIVE Sensitive     GENTAMICIN <=1 SENSITIVE Sensitive     IMIPENEM <=0.25 SENSITIVE Sensitive     NITROFURANTOIN <=16 SENSITIVE Sensitive     TRIMETH/SULFA >=320 RESISTANT Resistant     AMPICILLIN/SULBACTAM 16 INTERMEDIATE Intermediate     PIP/TAZO <=4 SENSITIVE Sensitive     * >=  100,000 COLONIES/mL ESCHERICHIA COLI     Radiology Studies:  DG Chest 2 View  Result Date: 11/18/2020 CLINICAL DATA:  Cough EXAM: CHEST - 2 VIEW COMPARISON:  November 05, 2020 FINDINGS: There is atelectatic change in the lung bases. No edema or airspace opacity. Pacemaker present with lead tips in right atrium and right ventricle regions. Heart size and pulmonary vascularity are normal. No adenopathy. There is aortic atherosclerosis. There is calcification in the left carotid artery. There is degenerative change in the thoracic spine. Postoperative change noted in right shoulder. Evidence of prior trauma lateral right clavicle. IMPRESSION: Bibasilar atelectasis. No edema or airspace opacity. Heart size within normal limits. Pacemaker lead positions unchanged. Aortic Atherosclerosis (ICD10-I70.0). Electronically Signed   By: Lowella Grip III M.D.   On: 11/18/2020 13:20   MR BRAIN WO CONTRAST  Result Date: 11/18/2020 CLINICAL DATA:  Altered mental status with increased left lower extremity weakness over baseline. History of right MCA stroke. EXAM: MRI HEAD WITHOUT CONTRAST TECHNIQUE: Multiplanar, multiecho pulse sequences of the brain and surrounding structures were obtained without intravenous contrast. COMPARISON:  Head CT 11/18/2020 and MRI 09/10/2020 FINDINGS: The study is mildly to moderately motion degraded. Brain: There is a moderately large chronic posterior right MCA territory infarct with associated chronic blood products (acute to subacute in 09/2020). No acute infarct is identified.  Chronic bilateral subdural hematomas measure up to 8 mm in thickness on the right and 9 mm on the left, unchanged from the prior MRI. There is no midline shift or other significant mass effect. Small T2 hyperintensities in the cerebral white matter bilaterally are unchanged from the prior MRI and are nonspecific but compatible with mild chronic small vessel ischemic disease. There is mild cerebral atrophy. Vascular: Major intracranial vascular flow voids are grossly preserved. Susceptibility artifact in the right sylvian fissure from a right MCA branch vessel stent. Skull and upper cervical spine: Left frontal craniotomy. Right frontal burr hole. Sinuses/Orbits: Bilateral cataract extraction. Mild bilateral ethmoid sinus mucosal thickening. Clear mastoid air cells. Other: None. IMPRESSION: 1. No acute intracranial abnormality. 2. Chronic right MCA territory infarct. 3. Chronic bilateral subdural hematomas, unchanged from 09/2020. 4. Mild chronic small vessel ischemic disease. Electronically Signed   By: Logan Bores M.D.   On: 11/18/2020 16:18     Scheduled Meds:   .  stroke: mapping our early stages of recovery book   Does not apply Once  . aspirin EC  81 mg Oral Daily  . atorvastatin  10 mg Oral Daily  . cloNIDine  0.1 mg Transdermal Q Wed  . darifenacin  7.5 mg Oral Daily  . ferrous sulfate  325 mg Oral Q breakfast  . FLUoxetine  20 mg Oral Daily  . heparin  5,000 Units Subcutaneous Q12H  . insulin aspart  0-9 Units Subcutaneous TID WC  . insulin glargine  10 Units Subcutaneous Daily  . levETIRAcetam  500 mg Oral BID  . loratadine  10 mg Oral QPM  . pantoprazole  40 mg Oral Daily  . ticagrelor  45 mg Oral BID    Continuous Infusions:   . cefTRIAXone (ROCEPHIN)  IV 1 g (11/19/20 1754)     LOS: 2 days     [place]    To contact the attending provider between 7A-7P or the covering provider during after hours 7P-7A, please log into the web site [email or web address]  and access using universal [place] password for that web site. If you do not have the password,  please call the hospital operator.  11/20/2020, 11:18 AM

## 2020-11-21 DIAGNOSIS — N390 Urinary tract infection, site not specified: Secondary | ICD-10-CM | POA: Diagnosis not present

## 2020-11-21 LAB — BASIC METABOLIC PANEL
Anion gap: 9 (ref 5–15)
BUN: 24 mg/dL — ABNORMAL HIGH (ref 8–23)
CO2: 27 mmol/L (ref 22–32)
Calcium: 8.9 mg/dL (ref 8.9–10.3)
Chloride: 101 mmol/L (ref 98–111)
Creatinine, Ser: 1.54 mg/dL — ABNORMAL HIGH (ref 0.61–1.24)
GFR, Estimated: 45 mL/min — ABNORMAL LOW (ref >60)
Glucose, Bld: 128 mg/dL — ABNORMAL HIGH (ref 70–99)
Potassium: 3.7 mmol/L (ref 3.5–5.1)
Sodium: 137 mmol/L (ref 135–145)

## 2020-11-21 LAB — GLUCOSE, CAPILLARY
Glucose-Capillary: 140 mg/dL — ABNORMAL HIGH (ref 70–99)
Glucose-Capillary: 227 mg/dL — ABNORMAL HIGH (ref 70–99)
Glucose-Capillary: 219 mg/dL — ABNORMAL HIGH (ref 70–99)
Glucose-Capillary: 252 mg/dL — ABNORMAL HIGH (ref 70–99)

## 2020-11-21 MED ORDER — GUAIFENESIN 100 MG/5ML PO SOLN
5.0000 mL | ORAL | Status: DC | PRN
  Administered 2020-11-21 – 2020-11-25 (×2): 100 mg via ORAL
  Filled 2020-11-21 (×2): qty 5

## 2020-11-21 MED ORDER — GUAIFENESIN 100 MG/5ML PO SOLN: 5.0000 mL | ORAL | 0 refills | Status: DC | PRN

## 2020-11-21 MED ORDER — HYDROCODONE BIT-HOMATROP MBR 5-1.5 MG/5ML PO SOLN: 5.0000 mL | Freq: Three times a day (TID) | ORAL | 0 refills | Status: DC | PRN

## 2020-11-21 MED ORDER — CEPHALEXIN 500 MG PO CAPS: 500.0000 mg | ORAL_CAPSULE | Freq: Three times a day (TID) | ORAL | Status: DC

## 2020-11-21 MED ORDER — SENNOSIDES-DOCUSATE SODIUM 8.6-50 MG PO TABS: 1.0000 | ORAL_TABLET | Freq: Every evening | ORAL | Status: DC | PRN

## 2020-11-21 NOTE — TOC Progression Note (Addendum)
Transition of Care [facility]) - Progression Note    Patient Details  Name: Frank [name] MRN: [id number] Date of Birth: 25-Mar-1941  Transition of Care [facility]) CM/SW [place], RN Phone Number: 11/21/2020, 11:44 AM  Clinical Narrative:    Case management spoke with Rexene Alberts, MSW and the patient's clinicals were faxed out to [facility] in [facility] to attention of Tammy, CM at fax [phone].  CM and MSW will continue to follow for transitions of care needs.   Expected Discharge Plan: [place] Barriers to Discharge: Continued Medical Work up  Expected Discharge Plan and Services Expected Discharge Plan: [place] Choice: [place] arrangements for the past 2 months: Single Family Home                                       Social Determinants of Health (SDOH) Interventions    Readmission Risk Interventions No flowsheet data found.

## 2020-11-21 NOTE — Discharge Instructions (Signed)
Urinary Tract Infection, Adult A urinary tract infection (UTI) is an infection of any part of the urinary tract. The urinary tract includes:  The kidneys.  The ureters.  The bladder.  The urethra. These organs make, store, and get rid of pee (urine) in the body. What are the causes? This infection is caused by germs (bacteria) in your genital area. These germs grow and cause swelling (inflammation) of your urinary tract. What increases the risk? The following factors may make you more likely to develop this condition:  Using a small, thin tube (catheter) to drain pee.  Not being able to control when you pee or poop (incontinence).  Being male. If you are male, these things can increase the risk: ? Using these methods to prevent pregnancy:  A medicine that kills sperm (spermicide).  A device that blocks sperm (diaphragm). ? Having low levels of a male hormone (estrogen). ? Being pregnant. You are more likely to develop this condition if:  You have genes that add to your risk.  You are sexually active.  You take antibiotic medicines.  You have trouble peeing because of: ? A prostate that is bigger than normal, if you are male. ? A blockage in the part of your body that drains pee from the bladder. ? A kidney stone. ? A nerve condition that affects your bladder. ? Not getting enough to drink. ? Not peeing often enough.  You have other conditions, such as: ? Diabetes. ? A weak disease-fighting system (immune system). ? Sickle cell disease. ? Gout. ? Injury of the spine. What are the signs or symptoms? Symptoms of this condition include:  Needing to pee right away.  Peeing small amounts often.  Pain or burning when peeing.  Blood in the pee.  Pee that smells bad or not like normal.  Trouble peeing.  Pee that is cloudy.  Fluid coming from the vagina, if you are male.  Pain in the belly or lower back. Other symptoms include:  Vomiting.  Not  feeling hungry.  Feeling mixed up (confused). This may be the first symptom in older adults.  Being tired and grouchy (irritable).  A fever.  Watery poop (diarrhea). How is this treated?  Taking antibiotic medicine.  Taking other medicines.  Drinking enough water. In some cases, you may need to see a specialist. Follow these instructions at home: Medicines  Take over-the-counter and prescription medicines only as told by your doctor.  If you were prescribed an antibiotic medicine, take it as told by your doctor. Do not stop taking it even if you start to feel better. General instructions  Make sure you: ? Pee until your bladder is empty. ? Do not hold pee for a long time. ? Empty your bladder after sex. ? Wipe from front to back after peeing or pooping if you are a male. Use each tissue one time when you wipe.  Drink enough fluid to keep your pee pale yellow.  Keep all follow-up visits.   Contact a doctor if:  You do not get better after 1-2 days.  Your symptoms go away and then come back. Get help right away if:  You have very bad back pain.  You have very bad pain in your lower belly.  You have a fever.  You have chills.  You feeling like you will vomit or you vomit. Summary  A urinary tract infection (UTI) is an infection of any part of the urinary tract.  This condition is caused by   germs in your genital area.  There are many risk factors for a UTI.  Treatment includes antibiotic medicines.  Drink enough fluid to keep your pee pale yellow. This information is not intended to replace advice given to you by your health care provider. Make sure you discuss any questions you have with your health care provider. Document Revised: 02/09/2020 Document Reviewed: 02/09/2020 Elsevier Patient Education  2021 [employer]  

## 2020-11-21 NOTE — Discharge Summary (Signed)
[facility]  Physician Discharge Summary   Patient ID: Frank [name] MRN: [id number] DOB/AGE: June 28, 1941 80 y.o.  Admit date: 11/18/2020 Discharge date: 11/21/2020  PCP: Janith Lima, MD  DISCHARGE DIAGNOSES:  Left leg weakness likely due to recrudescence of prior stroke due to UTI Acute urinary tract infection with E. 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  He had been discharged on DAPT (aspirin and Brilinta), however he ran out of Brilinta 2 weeks PTA and unable to refill.  In the ED, code stroke was called.  Neurology evaluated, CTH, CTA with no new stroke, no LVO, suspecting TIA versus recrudescence due to infectious/metabolic abnormalities and less likely due to  seizures.  Consultations:  Neurology  Procedures:  EEG  HOSPITAL COURSE:   Left leg weakness likely due to recrudescence of prior stroke due to UTI/stroke was ruled out. History as noted above.  Code stroke was activated in ED.  CT head and CTA head without new stroke and no large vessel occlusion but showed prior R MCA stroke.  MRI brain shows no acute intracranial abnormality, chronic right MCA territory infarct and chronic bilateral subdural hematomas, unchanged from 3/22.   EEG was done which did not show any acute epileptiform activity. Per neurology the presentation is likely due to TIA (primary diagnosis) versus recrudescence of prior strokes versus seizures (less likely).   TTE recently on 09/09/2020 with preserved LVEF and grade 1 diastolic dysfunction.  LDL 53.  A1c 7.7. TSH: 0.459.   Continuing aspirin 81 Mg daily and Brilinta 45 Mg twice daily.  Continue atorvastatin 10 Mg daily.  PT recommends SNF.   ST recommends dysphagia 2 and nectar thickened liquids.   Patient has significantly improved.  Acute metabolic encephalopathy Likely related to acute cystitis complicating possible underlying vascular dementia and CKD.  No acute stroke on imaging.  UDS positive for opiates and recently started codeine cough syrup could certainly have contributed to his presentation and opioids should be avoided. He is back to his baseline now.  E. coli acute cystitis without hematuria: Urine microscopy suggestive of UTI and urine culture shows >100 K colonies of E. Coli.    Patient was initially placed on ceftriaxone.  Changed over to cephalexin.  Dysphagia Speech therapy evaluated and recommended dysphagia 2 diet and nectar thickened liquids.  Strict aspiration precautions.  Transient hypoxia Chest x-ray showed bilateral atelectasis.  Concern regarding chronic aspiration.  Diet per speech therapy as noted above.  No clinical or radiological pneumonia at this time.    CKD  stage  IIIa Serum creatinine was in the 1.3-1.4 range in March.    Creatinine has been fluctuating.  Likely has experienced some progression of CKD.  Stable for the most part.  Type II DM with renal complications Continue Lantus, SSI and adjust insulins as needed.  OSA on nightly CPAP.  History of TBI Keppra resumed in March.  Essential hypertension Continue current medications.  Anemia in CKD Stable.  Goals of care Patient's spouse discussed with [facility] team and requesting palliative care consult.  Seen by palliative care.  Obesity Estimated body mass index is 30.27 kg/m as calculated from the following:   Height as of this encounter: 5\' 9"  (1.753 m).   Weight as of this encounter: 93 kg.  Patient is stable.  Okay for discharge to SNF when bed is available.   PERTINENT LABS:  The results of significant diagnostics from this hospitalization (including imaging, microbiology, ancillary and laboratory) are listed below for reference.    Microbiology: Recent Results (from the past 240 hour(s))  Resp Panel by RT-PCR (Flu A&B, Covid) Nasopharyngeal Swab     Status: None   Collection Time: 11/18/20  9:54 AM   Specimen: Nasopharyngeal Swab; Nasopharyngeal(NP) swabs in vial transport medium  Result Value Ref Range Status   SARS Coronavirus 2 by RT PCR NEGATIVE NEGATIVE Final    Comment: (NOTE) SARS-CoV-2 target nucleic acids are NOT DETECTED.  The SARS-CoV-2 RNA is generally detectable in upper respiratory specimens during the acute phase of infection. The lowest concentration of SARS-CoV-2 viral copies this assay can detect is 138 copies/mL. A negative result does not preclude SARS-Cov-2 infection and should not be used as the sole basis for treatment or other patient management decisions. A negative result may occur with  improper specimen collection/handling, submission of specimen other than nasopharyngeal swab, presence of viral mutation(s) within the areas targeted by this  assay, and inadequate number of viral copies(<138 copies/mL). A negative result must be combined with clinical observations, patient history, and epidemiological information. The expected result is Negative.  Fact Sheet for Patients:  EntrepreneurPulse.com.au  Fact Sheet for Healthcare Providers:  IncredibleEmployment.be  This test is no t yet approved or cleared by the [place] FDA and  has been authorized for detection and/or diagnosis of SARS-CoV-2 by FDA under an Emergency Use Authorization (EUA). This EUA will remain  in effect (meaning this test can be used) for the duration of the COVID-19 declaration under Section 564(b)(1) of the Act, 21 U.S.C.section 360bbb-3(b)(1), unless the authorization is terminated  or revoked sooner.       Influenza A by PCR NEGATIVE NEGATIVE Final   Influenza B by PCR NEGATIVE NEGATIVE Final    Comment: (NOTE) The Xpert Xpress SARS-CoV-2/FLU/RSV plus assay is intended as an aid in the diagnosis of influenza from Nasopharyngeal swab specimens and should not be used as a sole basis for treatment. Nasal washings and aspirates are unacceptable for Xpert Xpress SARS-CoV-2/FLU/RSV testing.  Fact Sheet for Patients: EntrepreneurPulse.com.au  Fact Sheet for Healthcare Providers: IncredibleEmployment.be  This test is not yet approved or cleared by the [place] FDA and has been authorized for detection and/or diagnosis of SARS-CoV-2 by FDA under an Emergency Use Authorization (EUA). This EUA will remain in effect (meaning this test can be used) for the duration of the COVID-19 declaration under Section 564(b)(1) of the Act, 21 U.S.C. section 360bbb-3(b)(1), unless the authorization is terminated or revoked.  Performed at [facility], [place] [address] [place], [place] [address]  Culture, Urine     Status: Abnormal   Collection Time: 11/18/20 11:56 AM    Specimen: Urine, Catheterized  Result Value Ref Range Status   Specimen Description URINE, CATHETERIZED  Final   Special Requests   Final    NONE Performed at [facility], [address], [place], [place] [address]    Culture >=100,000 COLONIES/mL ESCHERICHIA COLI (A)  Final   Report Status 11/20/2020 FINAL  Final   Organism ID, Bacteria ESCHERICHIA COLI (A)  Final      Susceptibility   Escherichia coli - MIC*    AMPICILLIN >=32 RESISTANT Resistant     CEFAZOLIN <=4 SENSITIVE Sensitive     CEFEPIME <=0.12 SENSITIVE Sensitive     CEFTRIAXONE <=0.25 SENSITIVE Sensitive     CIPROFLOXACIN <=0.25 SENSITIVE Sensitive     GENTAMICIN <=1 SENSITIVE Sensitive     IMIPENEM <=0.25 SENSITIVE Sensitive     NITROFURANTOIN <=16 SENSITIVE Sensitive     TRIMETH/SULFA >=320 RESISTANT Resistant     AMPICILLIN/SULBACTAM 16 INTERMEDIATE Intermediate     PIP/TAZO <=4 SENSITIVE Sensitive     * >=100,000 COLONIES/mL ESCHERICHIA COLI     Labs:  COVID-19 Labs   Lab Results  Component Value Date   SARSCOV2NAA NEGATIVE 11/18/2020   [place] NEGATIVE 11/06/2020   [place] NEGATIVE 09/17/2020   [place] NEGATIVE 09/09/2020      Basic Metabolic Panel: Recent Labs  Lab 11/18/20 0902 11/18/20 1002 11/19/20 0324 11/20/20 0257 11/21/20 0256  NA 136 135 137 139 137  K 4.1 4.1 4.0 3.7 3.7  CL 101 100 100 106 101  CO2 27  --  28 27 27   GLUCOSE 235* 213* 212* 128* 128*  BUN 30* 31* 30* 24* 24*  CREATININE 1.81* 1.60* 1.55* 1.38* 1.54*  CALCIUM 9.1  --  9.2 8.8* 8.9   Liver Function Tests: Recent Labs  Lab 11/18/20 0902  AST 18  ALT 15  ALKPHOS 111  BILITOT 0.8  PROT 6.7  ALBUMIN 3.3*   CBC: Recent Labs  Lab 11/18/20 0902 11/18/20 1002 11/20/20 0257  WBC 8.1  --  6.1  NEUTROABS 6.0  --   --   HGB 10.6* 9.9* 9.9*  HCT 34.5* 29.0* 31.8*  MCV 85.6  --  84.8  PLT 193  --  183   BNP: BNP (last 3 results) Recent Labs    05/14/20 1329  BNP 393.4*     CBG: Recent Labs  Lab 11/20/20 0611 11/20/20 1225 11/20/20 1614 11/20/20 2120 11/21/20 0706  GLUCAP 137* 252* 152* 137* 140*     IMAGING STUDIES DG Chest 2 View  Result Date: 11/18/2020 CLINICAL DATA:  Cough EXAM: CHEST - 2 VIEW COMPARISON:  November 05, 2020 FINDINGS: There is atelectatic change in the lung bases. No edema or airspace opacity. Pacemaker present with lead tips in right atrium and right ventricle regions. Heart size and pulmonary vascularity are normal. No adenopathy. There is aortic atherosclerosis. There is calcification in the left carotid artery. There is degenerative change in the thoracic spine. Postoperative change noted in right shoulder. Evidence of prior trauma lateral right clavicle. IMPRESSION: Bibasilar atelectasis. No edema or airspace opacity. Heart size within normal limits. Pacemaker lead positions unchanged. Aortic Atherosclerosis (ICD10-I70.0). Electronically Signed   By: Lowella Grip III M.D.   On: 11/18/2020 13:20   DG Chest 2 View  Result Date: 11/05/2020 CLINICAL DATA:  Cough and fatigue EXAM: CHEST - 2 VIEW COMPARISON:  September 18, 2020 FINDINGS: There is mild  bibasilar scarring. No edema or airspace opacity. Heart size and pulmonary vascular normal. 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 No adenopathy. There is aortic atherosclerosis. Postoperative change noted in the proximal left humerus. Evidence of prior trauma lateral right clavicle. IMPRESSION: Slight bibasilar scarring. No edema or airspace opacity. Stable cardiac silhouette. 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 Aortic Atherosclerosis (ICD10-I70.0). Electronically Signed   By: Lowella Grip III M.D.   On: 11/05/2020 12:11   MR BRAIN WO CONTRAST  Result Date: 11/18/2020 CLINICAL DATA:  Altered mental status with increased left lower extremity weakness over baseline. History of right MCA stroke. EXAM: MRI HEAD WITHOUT CONTRAST TECHNIQUE:  Multiplanar, multiecho pulse sequences of the brain and surrounding structures were obtained without intravenous contrast. COMPARISON:  Head CT 11/18/2020 and MRI 09/10/2020 FINDINGS: The study is mildly to moderately motion degraded. Brain: There is a moderately large chronic posterior right MCA territory infarct with associated chronic blood products (acute to subacute in 09/2020). No acute infarct is identified. Chronic bilateral subdural hematomas measure up to 8 mm in thickness on the right and 9 mm on the left, unchanged from the prior MRI. There is no midline shift or other significant mass effect. Small T2 hyperintensities in the cerebral white matter bilaterally are unchanged from the prior MRI and are nonspecific but compatible with mild chronic small vessel ischemic disease. There is mild cerebral atrophy. Vascular: Major intracranial vascular flow voids are grossly preserved. Susceptibility artifact in the right sylvian fissure from a right MCA branch vessel stent. Skull and upper cervical spine: Left frontal craniotomy. Right frontal burr hole. Sinuses/Orbits: Bilateral cataract extraction. Mild bilateral ethmoid sinus mucosal thickening. Clear mastoid air cells. Other: None. IMPRESSION: 1. No acute intracranial abnormality. 2. Chronic right MCA territory infarct. 3. Chronic bilateral subdural hematomas, unchanged from 09/2020. 4. Mild chronic small vessel ischemic disease. Electronically Signed   By: Logan Bores M.D.   On: 11/18/2020 16:18   EEG adult  Result Date: 11/20/2020 Lora Havens, MD     11/20/2020  5:37 PM Patient Name: Frank [name] MRN: [id number] Epilepsy Attending: Lora Havens Referring Physician/Provider: Dr Bonnielee Haff Date: 11/20/2020 Duration: 24 mins Patient history: 80yo M with prior R MCA stroke, now with  Hallucinations and fall. EEG to evaluate for seizure Level of alertness: Awake, asleep AEDs during EEG study: LEV Technical aspects: This EEG study was  done with scalp electrodes positioned according to the 10-20 International system of electrode placement. Electrical activity was acquired at a sampling rate of 500Hz  and reviewed with a high frequency filter of 70Hz  and a low frequency filter of 1Hz . EEG data were recorded continuously and digitally stored. Description: The posterior dominant rhythm consists of 8 Hz activity of moderate voltage (25-35 uV) seen predominantly in posterior head regions, asymmetric ( R<l) and reactive to eye opening and eye closing. Sleep was characterized by vertex waves, sleep spindles (12 to 14 Hz), maximal frontocentral region.  EEG showed continuous 5 to 8 Hz theta-alpha activity in left hemisphere admixed with 15 to 18 Hz beta activity in left central temporal region consistent with breach artifact.  There is also 3 to 5 Hz theta-delta slowing in right hemisphere. Sharp transients were seen in left centro-parietal region region.  Hyperventilation and photic stimulation were not performed.   ABNORMALITY - Continuous slow, generalized and lateralized right hemisphere - Breach artifact, left centro- temporal region  IMPRESSION: This study is suggestive of cortical dysfunction in right hemisphere  likely secondary to underlying encephalomalacia.  There is also evidence of breach artifact in left centro- temporal region consistent with prior craniotomy.  Additionally there is evidence of mild diffuse encephalopathy, nonspecific etiology.  No seizures or definite epileptiform discharges were seen throughout the recording.  Lora Havens   CUP PACEART REMOTE DEVICE CHECK  Result Date: 11/14/2020 Scheduled remote reviewed. Normal device function.  Next remote 91 days. Kathy Breach, RN, CCDS, [employer]  CT HEAD CODE STROKE WO CONTRAST  Result Date: 11/18/2020 CLINICAL DATA:  Code stroke. 80 year old male last known well at midnight. Right MCA infarct with hemorrhagic transformation in March. Right MCA M2 stent.  History of unresolved subdural hematomas. EXAM: CT HEAD WITHOUT CONTRAST TECHNIQUE: Contiguous axial images were obtained from the base of the skull through the vertex without intravenous contrast. COMPARISON:  Head CT 09/18/2020 and earlier. FINDINGS: Brain: Mixed density bilateral subdural hematomas are stable since March, measuring up to 8-9 mm in thickness bilaterally and most pronounced over the posterior convexities. Developing encephalomalacia throughout the posterior right MCA territory with resolved hyperdense hemorrhage and mass effect since March. Hypodensity in the posterior left corona radiata, posterior left internal capsule and medial left thalamus appears increased since March. Gray-white matter differentiation elsewhere appears stable. No acute cortically based infarct identified. Vascular: Mild Calcified atherosclerosis at the skull base. Right MCA branch stent in the sylvian fissure appears stable. Skull: Previous left superior convexity craniotomy, bilateral burr holes. No acute osseous abnormality identified. Sinuses/Orbits: Mildly improved ethmoid sinus mucosal thickening. Other Visualized paranasal sinuses and mastoids are stable and well aerated. Other: No acute orbit or scalp soft tissue findings. ASPECTS [facility] Stroke Program Early CT Score) Total score (0-10 with 10 being normal): 10 IMPRESSION: 1. No acute cortically based infarct or acute intracranial hemorrhage identified. ASPECTS 10. 2. 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 3. Questionable increased small vessel disease in the posterior limb left internal capsule and left thalamus since March. 4. Unresolved small bilateral mixed density subdural hematomas remain stable. Study discussed by telephone with Dr. Donnetta Simpers on 11/18/2020 at 09:28 . Electronically Signed   By: Genevie Ann M.D.   On: 11/18/2020 09:30   CT ANGIO HEAD CODE STROKE  Result  Date: 11/18/2020 CLINICAL DATA:  Code stroke. 80 year old male last known well at midnight. Right MCA infarct with hemorrhagic transformation in March. Right MCA M2 stent. History of unresolved subdural hematomas. EXAM: CT ANGIOGRAPHY HEAD AND NECK TECHNIQUE: Multidetector CT imaging of the head and neck was performed using the standard protocol during bolus administration of intravenous contrast. Multiplanar CT image reconstructions and MIPs were obtained to evaluate the vascular anatomy. Carotid stenosis measurements (when applicable) are obtained utilizing NASCET criteria, using the distal internal carotid diameter as the denominator. CONTRAST:  12mL OMNIPAQUE IOHEXOL 350 MG/ML SOLN COMPARISON:  Plain head CT 0919 hours today. CTA head and neck and CTP 09/09/20. FINDINGS: CTA NECK Skeleton: Stable.  No acute osseous abnormality identified. Upper chest: Left chest cardiac pacemaker redemonstrated. Patchy nonspecific upper lung opacity has not significantly changed since February and may be scarring. No superior mediastinal lymphadenopathy. Visible central pulmonary arteries appear patent. Other neck: Stable.  No acute finding. Aortic arch: Stable mild calcified arch atherosclerosis. Three vessel arch configuration. Right carotid system: Stable right CCA plaque without significant stenosis proximal to the bifurcation. Bulky calcified plaque at the right ICA origin and bulb resulting in radiographic string sign stenosis appears stable on series  5, image 104. The right ICA remains patent to the skull base. Left carotid system: Stable left CCA, left carotid bifurcation, proximal left ICA, and mild distal left cervical ICA calcified plaque with less than 50% stenosis. Vertebral arteries: Stable, and notable for mild to moderate right vertebral artery origin stenosis due to calcified plaque. CTA HEAD Posterior circulation: Stable and negative. Mildly dominant left V4 segment. Both posterior communicating arteries are  small but present. Anterior circulation: Both ICA siphons remain patent. Calcified siphon atherosclerosis is stable, mild to moderate and not associated with significant siphon stenosis. Normal posterior communicating artery and ophthalmic artery origins. Patent carotid termini, MCA and ACA origins. Dominant left A1 as before. 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 Left MCA M1 and left MCA branches are stable and within normal limits. Right MCA M1 and right MCA bifurcation remain patent without stenosis. Posterior right M2 stent has been placed traversing the embolized calcification demonstrated in February. The stent appears patent and enhancing (series 15, image 10). Although posterior right MCA division branches distal to the stent remain diminutive or absent. Other right MCA branches appear stable. Venous sinuses: Patent. Anatomic variants: Dominant left ACA A1. Review of the MIP images confirms the above findings IMPRESSION: 1. Negative for emergent large vessel occlusion. 2. Stented posterior Right MCA branch where the embolized calcification was noted in February. The stent is patent. And other MCA branches remain stable. 3. 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 Electronically Signed   By: Genevie Ann M.D.   On: 11/18/2020 10:09   CT ANGIO NECK CODE STROKE  Result Date: 11/18/2020 CLINICAL DATA:  Code stroke. 80 year old male last known well at midnight. Right MCA infarct with hemorrhagic transformation in March. Right MCA M2 stent. History of unresolved subdural hematomas. EXAM: CT ANGIOGRAPHY HEAD AND NECK TECHNIQUE: Multidetector CT imaging of the head and neck was performed using the standard protocol during bolus  administration of intravenous contrast. Multiplanar CT image reconstructions and MIPs were obtained to evaluate the vascular anatomy. Carotid stenosis measurements (when applicable) are obtained utilizing NASCET criteria, using the distal internal carotid diameter as the denominator. CONTRAST:  79mL OMNIPAQUE IOHEXOL 350 MG/ML SOLN COMPARISON:  Plain head CT 0919 hours today. CTA head and neck and CTP 09/09/20. FINDINGS: CTA NECK Skeleton: Stable.  No acute osseous abnormality identified. Upper chest: Left chest cardiac pacemaker redemonstrated. Patchy nonspecific upper lung opacity has not significantly changed since February and may be scarring. No superior mediastinal lymphadenopathy. Visible central pulmonary arteries appear patent. Other neck: Stable.  No acute finding. Aortic arch: Stable mild calcified arch atherosclerosis. Three vessel arch configuration. Right carotid system: Stable right CCA plaque without significant stenosis proximal to the bifurcation. Bulky calcified plaque at the right ICA origin and bulb resulting in radiographic string sign stenosis appears stable on series 5, image 104. The right ICA remains patent to the skull base. Left carotid system: Stable left CCA, left carotid bifurcation, proximal left ICA, and mild distal left cervical ICA calcified plaque with less than 50% stenosis. Vertebral arteries: Stable, and notable for mild to moderate right vertebral artery origin stenosis due to calcified plaque. CTA HEAD Posterior circulation: Stable and negative. Mildly dominant left V4 segment. Both posterior communicating arteries are small but present. Anterior circulation: Both ICA  siphons remain patent. Calcified siphon atherosclerosis is stable, mild to moderate and not associated with significant siphon stenosis. Normal posterior communicating artery and ophthalmic artery origins. Patent carotid termini, MCA and ACA origins. Dominant left A1 as before. Anteriorly directed broad-based  anterior communicating artery aneurysm measures 2-3 mm and appears stable (series 10, image 40 today). Left MCA M1 and left MCA branches are stable and within normal limits. Right MCA M1 and right MCA bifurcation remain patent without stenosis. Posterior right M2 stent has been placed traversing the embolized calcification demonstrated in February. The stent appears patent and enhancing (series 15, image 10). Although posterior right MCA division branches distal to the stent remain diminutive or absent. Other right MCA branches appear stable. Venous sinuses: Patent. Anatomic variants: Dominant left ACA A1. Review of the MIP images confirms the above findings IMPRESSION: 1. Negative for emergent large vessel occlusion. 2. Stented posterior Right MCA branch where the embolized calcification was noted in February. The stent is patent. And other MCA branches remain stable. 3. 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 Electronically Signed   By: Genevie Ann M.D.   On: 11/18/2020 10:09    DISCHARGE EXAMINATION: Vitals:   11/20/20 1940 11/20/20 2334 11/21/20 0343 11/21/20 0719  BP:   (!) 162/70 (!) 159/73  Pulse: 83  72 78  Resp:    17  Temp: 98.7 F (37.1 C) 99.6 F (37.6 C) 99.4 F (37.4 C) 98.6 F (37 C)  TempSrc: Oral Axillary Axillary Oral  SpO2: 90%  91% 90%  Weight:      Height:       General appearance: Awake alert.  In no distress Resp: Clear to auscultation bilaterally.  Normal effort Cardio: S1-S2 is normal regular.  No S3-S4.  No rubs murmurs or bruit GI: Abdomen is soft.  Nontender nondistended.  Bowel sounds are present normal.  No masses organomegaly    DISPOSITION: Home SNF  Discharge Instructions    Call MD for:  difficulty breathing, headache or visual disturbances   Complete by: As  directed    Call MD for:  extreme fatigue   Complete by: As directed    Call MD for:  persistant dizziness or light-headedness   Complete by: As directed    Call MD for:  persistant nausea and vomiting   Complete by: As directed    Call MD for:  severe uncontrolled pain   Complete by: As directed    Call MD for:  temperature >100.4   Complete by: As directed    Diet - low sodium heart healthy   Complete by: As directed    Discharge instructions   Complete by: As directed    Please review instructions on the discharge summary.  You were cared for by a hospitalist during your hospital stay. If you have any questions about your discharge medications or the care you received while you were in the hospital after you are discharged, you can call the unit and asked to speak with the hospitalist on call if the hospitalist that took care of you is not available. Once you are discharged, your primary care physician will handle any further medical issues. Please note that NO REFILLS for any discharge medications will be authorized once you are discharged, as it is imperative that you return to your primary care physician (or  establish a relationship with a primary care physician if you do not have one) for your aftercare needs so that they can reassess your need for medications and monitor your lab values. If you do not have a primary care physician, you can call [phone] for a physician referral.   Increase activity slowly   Complete by: As directed         Allergies as of 11/21/2020      Reactions   Lisinopril Cough   Invokana [canagliflozin] Other (See Comments)   Stopped by MD   Penicillins Other (See Comments)   Allergic as a child.  Patient does not remember reaction.  Has had cephalasporins without problems.   Sulfamethoxazole Other (See Comments)   Unknown reaction- from childhood      Medication List    STOP taking these medications   HYDROcodone bit-homatropine 5-1.5 MG/5ML  syrup Commonly known as: HYCODAN     TAKE these medications   ACCRUFeR 30 MG Caps Generic drug: Ferric Maltol Take 1 capsule by mouth in the morning and at bedtime. What changed: how much to take   amLODipine 10 MG tablet Commonly known as: NORVASC Take 1 tablet (10 mg total) by mouth daily.   aspirin 81 MG chewable tablet Chew 1 tablet (81 mg total) by mouth daily.   atorvastatin 10 MG tablet Commonly known as: LIPITOR Take 1 tablet (10 mg total) by mouth daily.   cephALEXin 500 MG capsule Commonly known as: KEFLEX Take 1 capsule (500 mg total) by mouth every 8 (eight) hours for 4 days.   cloNIDine 0.1 mg/24hr patch Commonly known as: CATAPRES - Dosed in mg/24 hr Place 1 patch (0.1 mg total) onto the skin once a week.   FLUoxetine 20 MG capsule Commonly known as: PROZAC TAKE 1 CAPSULE BY MOUTH  DAILY   fluticasone 50 MCG/ACT nasal spray Commonly known as: FLONASE Place 1 spray into both nostrils daily.   glipiZIDE 10 MG 24 hr tablet Commonly known as: GLUCOTROL XL TAKE 1 TABLET BY MOUTH  DAILY What changed: when to take this   guaiFENesin 100 MG/5ML Soln Commonly known as: ROBITUSSIN Take 5 mLs (100 mg total) by mouth every 4 (four) hours as needed for cough or to loosen phlegm.   Gvoke HypoPen 2-Pack 1 MG/0.2ML Soaj Generic drug: Glucagon Inject 1 Act into the skin daily as needed. What changed:   how much to take  when to take this  reasons to take this   hydrocortisone cream 1 % Apply 1 application topically 4 (four) times daily as needed for itching.   Insulin Pen Needle 32G X 6 MM Misc 1 Act by Does not apply route daily.   levETIRAcetam 500 MG tablet Commonly known as: KEPPRA Take 500 mg by mouth 2 (two) times daily. What changed: Another medication with the same name was removed. Continue taking this medication, and follow the directions you see here.   loratadine 10 MG tablet Commonly known as: CLARITIN Take 1 tablet (10 mg total) by  mouth every evening.   pantoprazole 40 MG tablet Commonly known as: PROTONIX TAKE 1 TABLET BY MOUTH  DAILY   PRESCRIPTION MEDICATION CPAP- At bedtime   senna-docusate 8.6-50 MG tablet Commonly known as: Senokot-S Take 1 tablet by mouth at bedtime as needed for mild constipation.   solifenacin 10 MG tablet Commonly known as: VESICARE Take 1 tablet (10 mg total) by mouth daily.   ticagrelor 90 MG Tabs tablet Commonly known as: BRILINTA Take 0.5  tablets (45 mg total) by mouth 2 (two) times daily.   Toujeo Max SoloStar 300 UNIT/ML Solostar Pen Generic drug: insulin glargine (2 Unit Dial) Inject 10 Units into the skin daily.          TOTAL DISCHARGE TIME: 35 minutes  Ericson Nafziger [employer] on [email or web address]  11/21/2020, 9:59 AM

## 2020-11-21 NOTE — Progress Notes (Signed)
Occupational Therapy Treatment Patient Details Name: Frank [name] MRN: [id number] DOB: 02/17/1941 Today's Date: 11/21/2020    History of present illness 80yo male admitted 11/18/20 presenting with c/o L LE weakness as well as multiple falls at home, confusion. Per neurology w/u, concern for TIA vs recrudescence of past CVA. MRI negative for acute changes. 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 Pt performing functional mobility to/from sink and recliner. Performing oral care at sink with Min A for posterior lean. Pt presenting with decreased attention to L, functional use of LUE, balance, strength, and cognition. Very motivated to participate in therapy. Recommend dc to post-acute rehab and will continue to follow acutely as admitted.    Follow Up Recommendations  SNF    Equipment Recommendations  Other (comment) (Defer to next venue)    Recommendations for Other Services PT consult    Precautions / Restrictions Precautions Precautions: Fall;Other (comment) Precaution Comments: recent R MCA CVA/L weakness, posterior LOB, high fall risk       Mobility Bed Mobility Overal bed mobility: Needs Assistance Bed Mobility: Supine to Sit     Supine to sit: Min guard     General bed mobility comments: min guard for safety, no physical assist required    Transfers Overall transfer level: Needs assistance Equipment used: Rolling walker (2 wheeled) Transfers: Sit to/from Stand Sit to Stand: Min assist         General transfer comment: Min A for power up into standing    Balance Overall balance assessment: History of Falls Sitting-balance support: Feet supported;No upper extremity supported Sitting balance-Leahy Scale: Good   Postural control: Posterior lean Standing balance support: Bilateral upper extremity supported;During functional  activity;Single extremity supported Standing balance-Leahy Scale: Poor Standing balance comment: Slight posterior lean in standing at sink                           ADL either performed or assessed with clinical judgement   ADL Overall ADL's : Needs assistance/impaired     Grooming: Oral care;Minimal assistance;Standing Grooming Details (indicate cue type and reason): Min A for posterior lean             Lower Body Dressing: Minimal assistance;Sit to/from stand;Moderate assistance Lower Body Dressing Details (indicate cue type and reason): Min-Mod A for posterior lean. Abel to reach forward and adjust socks             Functional mobility during ADLs: Rolling walker;Minimal assistance;Cueing for safety;Cueing for sequencing General ADL Comments: Pt performing oral care at sink and then mobiltiy to recliner. Pt presenting with posteiorr lean and poor awareness of L side.     Vision       Perception     Praxis      Cognition Arousal/Alertness: Awake/alert Behavior During Therapy: WFL for tasks assessed/performed Overall Cognitive Status: Within Functional Limits for tasks assessed                                 General Comments: history of TBI but seemed to follow cues/commands well; Reduced awareness of deficits and safety but was able to verbalize need for rehab prior to returning home        Exercises     Shoulder Instructions  General Comments VSS on RA    Pertinent Vitals/ Pain       Pain Assessment: Faces Faces Pain Scale: No hurt Pain Intervention(s): Monitored during session;Limited activity within patient's tolerance;Repositioned  Home Living                                          Prior Functioning/Environment              Frequency  Min 2X/week        Progress Toward Goals  OT Goals(current goals can now be found in the care plan section)  Progress towards OT goals: Progressing  toward goals  Acute Rehab OT Goals Patient Stated Goal: Go home but understands rehab OT Goal Formulation: With patient Time For Goal Achievement: 12/03/20 Potential to Achieve Goals: Good ADL Goals Pt Will Perform Grooming: with min assist;sitting;bed level Pt Will Perform Upper Body Dressing: with mod assist;sitting Pt Will Transfer to Toilet: with mod assist;stand pivot transfer;bedside commode Additional ADL Goal #1: Pt will perform bed mobility with Mod A in preparation for ADLs  Plan Discharge plan remains appropriate    Co-evaluation                 AM-PAC OT "6 Clicks" Daily Activity     Outcome Measure   Help from another person eating meals?: A Little Help from another person taking care of personal grooming?: A Little Help from another person toileting, which includes using toliet, bedpan, or urinal?: A Lot Help from another person bathing (including washing, rinsing, drying)?: A Lot Help from another person to put on and taking off regular upper body clothing?: A Little Help from another person to put on and taking off regular lower body clothing?: A Lot 6 Click Score: 15    End of Session Equipment Utilized During Treatment: Gait belt;Rolling walker  OT Visit Diagnosis: Unsteadiness on feet (R26.81);Other abnormalities of gait and mobility (R26.89);Muscle weakness (generalized) (M62.81)   Activity Tolerance Patient tolerated treatment well   Patient Left in chair;with call bell/phone within reach   Nurse Communication Mobility status        Time: 4628-6381 OT Time Calculation (min): 24 min  Charges: OT General Charges $OT Visit: 1 Visit OT Treatments $Self Care/Home Management : 23-37 mins  [place], OTR/L Acute Rehab Pager: [phone] Office: [place] 11/21/2020, 1:36 PM

## 2020-11-22 LAB — GLUCOSE, CAPILLARY
Glucose-Capillary: 196 mg/dL — ABNORMAL HIGH (ref 70–99)
Glucose-Capillary: 257 mg/dL — ABNORMAL HIGH (ref 70–99)
Glucose-Capillary: 184 mg/dL — ABNORMAL HIGH (ref 70–99)
Glucose-Capillary: 157 mg/dL — ABNORMAL HIGH (ref 70–99)

## 2020-11-22 MED ORDER — BENZONATATE 100 MG PO CAPS: 100.0000 mg | ORAL_CAPSULE | Freq: Three times a day (TID) | ORAL | 0 refills | Status: DC

## 2020-11-22 MED ORDER — COVID-19 MRNA VAC-TRIS(PFIZER) 30 MCG/0.3ML IM SUSP
0.3000 mL | Freq: Once | INTRAMUSCULAR | Status: AC
  Administered 2020-11-22: 0.3 mL via INTRAMUSCULAR
  Filled 2020-11-22: qty 0.3

## 2020-11-22 MED ORDER — BENZONATATE 100 MG PO CAPS
100.0000 mg | ORAL_CAPSULE | Freq: Three times a day (TID) | ORAL | Status: DC
  Administered 2020-11-22 – 2020-11-25 (×10): 100 mg via ORAL
  Filled 2020-11-22 (×10): qty 1

## 2020-11-22 MED ORDER — FLUTICASONE PROPIONATE 50 MCG/ACT NA SUSP
2.0000 | Freq: Every day | NASAL | Status: DC
  Administered 2020-11-22 – 2020-11-25 (×4): 2 via NASAL
  Filled 2020-11-22: qty 16

## 2020-11-22 NOTE — TOC Progression Note (Addendum)
Transition of Care [facility]) - Progression Note    Patient Details  Name: Frank [name] MRN: [id number] Date of Birth: [birth year]  [facility]) CM/SW [place], [place] Phone Number: 11/22/2020, 1:21 PM  Clinical Narrative:   CSW following for discharge plans. CSW sent patient's facesheet to [facility] and confirmed with Tammy that it was received, she is reviewing and will get back to CSW. CSW spoke with patient's wife, [name], to update on progress with pursuing IR at [facility]. CSW explained that we may not get approval but they are willing to try. If IR is not an option, [name] is agreeable to SNF, preference for [facility] but also open to other options if they don't have a bed available. CSW contacted [facility], spoke with [facility] in Admissions. CSW sent referral and they are reviewing. They won't have any beds until next week, but will let CSW know about availability after review of referral. CSW also faxed out referral in the [place] area, and will follow up with [place] on bed offers and responses from [facility] and [facility].  UPDATE 1:42 PM: CSW notified by PT that patient did not do as well, likely cannot tolerate intense rehab at this time, recommendation changed to SNF. CSW updated [facility], and [employer] will not authorize without therapy recommendations, unable to pursue at this time.      Expected Discharge Plan: Minor Barriers to Discharge: Continued Medical Work up  Expected Discharge Plan and Services Expected Discharge Plan: [place]: [place] arrangements for the past 2 months: Single Family Home                                       Social Determinants of Health (SDOH) Interventions    Readmission Risk Interventions No flowsheet data found.

## 2020-11-22 NOTE — Care Management Important Message (Signed)
Important Message  Patient Details  Name: Frank [name] MRN: [id number] Date of Birth: 1941/05/26   Medicare Important Message Given:  Yes     Martiza Speth [address] 11/22/2020, 8:41 AM

## 2020-11-22 NOTE — TOC Progression Note (Signed)
Transition of Care [facility]) - Progression Note    Patient Details  Name: Frank [name] MRN: [id number] Date of Birth: October 09, 1940  Transition of Care [facility]) CM/SW [place], [place] Phone Number: 11/22/2020, 4:24 PM  Clinical Narrative:    CSW updated pt's spouse that [facility] would be unable to take pt at this time. Other SNF options were given and she stated absolutely not [facility]. She will review the options and follow up with CSW tomorrow. SW will continue to follow for DC placement.   Expected Discharge Plan: [place] Barriers to Discharge: Continued Medical Work up  Expected Discharge Plan and Services Expected Discharge Plan: [place]: [place] arrangements for the past 2 months: Single Family Home                                       Social Determinants of Health (SDOH) Interventions    Readmission Risk Interventions No flowsheet data found.

## 2020-11-22 NOTE — Progress Notes (Signed)
Physical Therapy Treatment Patient Details Name: Frank [name] MRN: [id number] DOB: Mar 29, 1941 Today's Date: 11/22/2020    History of Present Illness 80yo male admitted 11/18/20 presenting with c/o L LE weakness as well as multiple falls at home, confusion. Per neurology w/u, concern for TIA vs recrudescence of past CVA. MRI negative for acute changes. 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 Patient requiring increased assistance for sit to stand transfer with posterior lean and bracing B LEs against bed. Requiring cues for anterior weight shift. Patient unable to advance B LEs forward to take steps but able to side step 2' towards [facility]. Updated recommendation to SNF as unsure if patient will tolerate intensive therapies.     Follow Up Recommendations  SNF;Supervision/Assistance - 24 hour     Equipment Recommendations  None recommended by PT (defer to post acute rehab)    Recommendations for Other Services       Precautions / Restrictions Precautions Precautions: Fall;Other (comment) Precaution Comments: recent R MCA CVA/L weakness, posterior LOB, high fall risk Restrictions Weight Bearing Restrictions: No    Mobility  Bed Mobility Overal bed mobility: Needs Assistance Bed Mobility: Supine to Sit;Sit to Supine     Supine to sit: Min assist Sit to supine: Min assist   General bed mobility comments: minA for repositioning hips towards EOB and bringing LEs back onto bed    Transfers Overall transfer level: Needs assistance Equipment used: Rolling Kristyana Notte (2 wheeled) Transfers: Sit to/from Stand Sit to Stand: Min assist;Mod assist;+2 physical assistance;+2 safety/equipment         General transfer comment: minA+2 initially from low surface. As patient fatigued, patient requires modA+2 to power up. Sit to stand x 5. Cues for hand placement each time  Ambulation/Gait Ambulation/Gait  assistance: Mod assist;+2 physical assistance;+2 safety/equipment Gait Distance (Feet): 2 Feet Assistive device: Rolling Edilson Vital (2 wheeled) Gait Pattern/deviations: Step-to pattern;Decreased stride length;Decreased weight shift to right;Decreased weight shift to left;Leaning posteriorly;Narrow base of support     General Gait Details: modA+2 for balance and weightshifting L/R to advance feet to complete sidestepping at EOB. Patient with posterior lean throughout using bed to stability   Stairs             Wheelchair Mobility    Modified Rankin (Stroke Patients Only)       Balance Overall balance assessment: History of Falls Sitting-balance support: Feet supported;No upper extremity supported Sitting balance-Leahy Scale: Good   Postural control: Posterior lean Standing balance support: Bilateral upper extremity supported;During functional activity;Single extremity supported Standing balance-Leahy Scale: Poor Standing balance comment: posterior lean in standing with B LEs braced against bed                            Cognition Arousal/Alertness: Awake/alert Behavior During Therapy: WFL for tasks assessed/performed Overall Cognitive Status: Within Functional Limits for tasks assessed                                 General Comments: history of TBI but seemed to follow cues/commands well; Reduced awareness of deficits and safety but was able to verbalize need for rehab prior to returning home      Exercises      General Comments        Pertinent Vitals/Pain Pain  Assessment: No/denies pain    Home Living                      Prior Function            PT Goals (current goals can now be found in the care plan section) Acute Rehab PT Goals Patient Stated Goal: Go home but understands rehab PT Goal Formulation: With patient/family Time For Goal Achievement: 12/02/20 Potential to Achieve Goals: Fair Progress towards PT goals:  Progressing toward goals    Frequency    Min 3X/week      PT Plan Discharge plan needs to be updated    Co-evaluation              AM-PAC PT "6 Clicks" Mobility   Outcome Measure  Help needed turning from your back to your side while in a flat bed without using bedrails?: A Little Help needed moving from lying on your back to sitting on the side of a flat bed without using bedrails?: A Little Help needed moving to and from a bed to a chair (including a wheelchair)?: Total Help needed standing up from a chair using your arms (e.g., wheelchair or bedside chair)?: Total Help needed to walk in hospital room?: Total Help needed climbing 3-5 steps with a railing? : Total 6 Click Score: 10    End of Session Equipment Utilized During Treatment: Gait belt Activity Tolerance: Patient tolerated treatment well Patient left: in bed;with call bell/phone within reach;with bed alarm set Nurse Communication: Mobility status PT Visit Diagnosis: Unsteadiness on feet (R26.81);Difficulty in walking, not elsewhere classified (R26.2);Repeated falls (R29.6);Muscle weakness (generalized) (M62.81)     Time: 1129-1150 PT Time Calculation (min) (ACUTE ONLY): 21 min  Charges:  $Therapeutic Activity: 8-22 mins                     Jacara Benito A. Gilford Rile PT, DPT Acute Rehabilitation Services Pager [phone] Office [phone]    Linna Hoff 11/22/2020, 1:16 PM

## 2020-11-22 NOTE — Discharge Summary (Signed)
[facility]  Physician Discharge Summary   Patient ID: Frank [name] MRN: [id number] DOB/AGE: [id number] 82 y.o.  Admit date: 11/18/2020 Discharge date: 11/22/2020  PCP: Janith Lima, MD  DISCHARGE DIAGNOSES:  Left leg weakness likely due to recrudescence of prior stroke due to UTI Acute urinary tract infection with E. 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  He had been discharged on DAPT (aspirin and Brilinta), however he ran out of Brilinta 2 weeks PTA and unable to refill.  In the ED, code stroke was called.  Neurology evaluated, CTH, CTA with no new stroke, no LVO, suspecting TIA versus recrudescence due to infectious/metabolic abnormalities and less likely due to  seizures.  Consultations:  Neurology  Procedures:  EEG  HOSPITAL COURSE:   Left leg weakness likely due to recrudescence of prior stroke due to UTI/stroke was ruled out. History as noted above.  Code stroke was activated in ED.  CT head and CTA head without new stroke and no large vessel occlusion but showed prior R MCA stroke.  MRI brain shows no acute intracranial abnormality, chronic right MCA territory infarct and chronic bilateral subdural hematomas, unchanged from 3/22.   EEG was done which did not show any acute epileptiform activity. Per neurology the presentation is likely due to TIA (primary diagnosis) versus recrudescence of prior strokes versus seizures (less likely).   TTE recently on 09/09/2020 with preserved LVEF and grade 1 diastolic dysfunction.  LDL 53.  A1c 7.7. TSH: 0.459.   Continuing aspirin 81 Mg daily and Brilinta 45 Mg twice daily.  Continue atorvastatin 10 Mg daily.  PT recommends SNF.   ST recommends dysphagia 2 and nectar thickened liquids.   Patient has significantly improved.  Acute metabolic encephalopathy Likely related to acute cystitis complicating possible underlying vascular dementia and CKD.  No acute stroke on imaging.  UDS positive for opiates and recently started codeine cough syrup could certainly have contributed to his presentation and opioids should be avoided. He is back to his baseline now.  E. coli acute cystitis without hematuria: Urine microscopy suggestive of UTI and urine culture shows >100 K colonies of E. Coli.    Patient was initially placed on ceftriaxone.  Changed over to cephalexin.  Dysphagia Speech therapy evaluated and recommended dysphagia 2 diet and nectar thickened liquids.  Strict aspiration precautions.  Transient hypoxia Chest x-ray showed bilateral atelectasis.  Concern regarding chronic aspiration.  Diet per speech therapy as noted above.  No clinical or radiological pneumonia at this time.    CKD  stage  IIIa Serum creatinine was in the 1.3-1.4 range in March.    Creatinine has been fluctuating.  Likely has experienced some progression of CKD.  Stable for the most part.  Type II DM with renal complications Continue Lantus, SSI and adjust insulins as needed.  OSA on nightly CPAP.  History of TBI Keppra resumed in March.  Essential hypertension Continue current medications.  Anemia in CKD Stable.  Chronic cough secondary to postnasal drip. Continue antitussive agents as well as Flonase.  Goals of care Patient's spouse discussed with [facility] team and requesting palliative care consult.  Seen by palliative care.  Obesity Estimated body mass index is 30.27 kg/m as calculated from the following:   Height as of this encounter: 5\' 9"  (1.753 m).   Weight as of this encounter: 93 kg.  Patient is stable.  Okay for discharge to SNF when bed is available.   PERTINENT LABS:  The results of significant diagnostics from this hospitalization (including imaging, microbiology, ancillary and laboratory) are listed below for reference.    Microbiology: Recent Results (from the past 240 hour(s))  Resp Panel by RT-PCR (Flu A&B, Covid) Nasopharyngeal Swab     Status: None   Collection Time: 11/18/20  9:54 AM   Specimen: Nasopharyngeal Swab; Nasopharyngeal(NP) swabs in vial transport medium  Result Value Ref Range Status   SARS Coronavirus 2 by RT PCR NEGATIVE NEGATIVE Final    Comment: (NOTE) SARS-CoV-2 target nucleic acids are NOT DETECTED.  The SARS-CoV-2 RNA is generally detectable in upper respiratory specimens during the acute phase of infection. The lowest concentration of SARS-CoV-2 viral copies this assay can detect is 138 copies/mL. A negative result does not preclude SARS-Cov-2 infection and should not be used as the sole basis for treatment or other patient management decisions. A negative result may occur with  improper specimen collection/handling, submission of specimen  other than nasopharyngeal swab, presence of viral mutation(s) within the areas targeted by this assay, and inadequate number of viral copies(<138 copies/mL). A negative result must be combined with clinical observations, patient history, and epidemiological information. The expected result is Negative.  Fact Sheet for Patients:  EntrepreneurPulse.com.au  Fact Sheet for Healthcare Providers:  IncredibleEmployment.be  This test is no t yet approved or cleared by the [place] FDA and  has been authorized for detection and/or diagnosis of SARS-CoV-2 by FDA under an Emergency Use Authorization (EUA). This EUA will remain  in effect (meaning this test can be used) for the duration of the COVID-19 declaration under Section 564(b)(1) of the Act, 21 U.S.C.section 360bbb-3(b)(1), unless the authorization is terminated  or revoked sooner.       Influenza A by PCR NEGATIVE NEGATIVE Final   Influenza B by PCR NEGATIVE NEGATIVE Final    Comment: (NOTE) The Xpert Xpress SARS-CoV-2/FLU/RSV plus assay is intended as an aid in the diagnosis of influenza from Nasopharyngeal swab specimens and should not be used as a sole basis for treatment. Nasal washings and aspirates are unacceptable for Xpert Xpress SARS-CoV-2/FLU/RSV testing.  Fact Sheet for Patients: EntrepreneurPulse.com.au  Fact Sheet for Healthcare Providers: IncredibleEmployment.be  This test is not yet approved or cleared by the [place] FDA and has been authorized for detection and/or diagnosis of SARS-CoV-2 by FDA under an Emergency Use Authorization (EUA). This EUA will remain in effect (meaning this test can be used) for the duration of the COVID-19 declaration under Section 564(b)(1) of the Act, 21 U.S.C. section 360bbb-3(b)(1), unless the authorization is terminated or revoked.  Performed at [facility], [place] [address], [place],  [place] [address]   Culture, Urine     Status: Abnormal   Collection Time: 11/18/20 11:56 AM   Specimen: Urine, Catheterized  Result Value Ref Range Status   Specimen Description URINE, CATHETERIZED  Final   Special Requests   Final    NONE Performed at [facility], [place] [address] [place], [place] [address]    Culture >=100,000 COLONIES/mL ESCHERICHIA COLI (A)  Final   Report Status 11/20/2020 FINAL  Final   Organism ID, Bacteria ESCHERICHIA COLI (A)  Final      Susceptibility   Escherichia coli - MIC*    AMPICILLIN >=32 RESISTANT Resistant     CEFAZOLIN <=4 SENSITIVE Sensitive     CEFEPIME <=0.12 SENSITIVE Sensitive     CEFTRIAXONE <=0.25 SENSITIVE Sensitive     CIPROFLOXACIN <=0.25 SENSITIVE Sensitive     GENTAMICIN <=1 SENSITIVE Sensitive     IMIPENEM <=0.25 SENSITIVE Sensitive     NITROFURANTOIN <=16 SENSITIVE Sensitive     TRIMETH/SULFA >=320 RESISTANT Resistant     AMPICILLIN/SULBACTAM 16 INTERMEDIATE Intermediate     PIP/TAZO <=4 SENSITIVE Sensitive     * >=100,000 COLONIES/mL ESCHERICHIA COLI     Labs:  COVID-19 Labs   Lab Results  Component Value Date   SARSCOV2NAA NEGATIVE 11/18/2020   [place] NEGATIVE 11/06/2020   [place] NEGATIVE 09/17/2020   [place] NEGATIVE 09/09/2020      Basic Metabolic Panel: Recent Labs  Lab 11/18/20 0902 11/18/20 1002 11/19/20 0324 11/20/20 0257 11/21/20 0256  NA 136 135 137 139 137  K 4.1 4.1 4.0 3.7 3.7  CL 101 100 100 106 101  CO2 27  --  28 27 27   GLUCOSE 235* 213* 212* 128* 128*  BUN 30* 31* 30* 24* 24*  CREATININE 1.81* 1.60* 1.55* 1.38* 1.54*  CALCIUM 9.1  --  9.2 8.8* 8.9   Liver Function Tests: Recent Labs  Lab 11/18/20 0902  AST 18  ALT 15  ALKPHOS 111  BILITOT 0.8  PROT 6.7  ALBUMIN 3.3*   CBC: Recent Labs  Lab 11/18/20 0902 11/18/20 1002 11/20/20 0257  WBC 8.1  --  6.1  NEUTROABS 6.0  --   --   HGB 10.6* 9.9* 9.9*  HCT 34.5* 29.0* 31.8*  MCV 85.6  --  84.8  PLT 193  --  183    BNP: BNP (last 3 results) Recent Labs    05/14/20 1329  BNP 393.4*    CBG: Recent Labs  Lab 11/21/20 0706 11/21/20 1156 11/21/20 1600 11/21/20 2126 11/22/20 0634  GLUCAP 140* 227* 219* 252* 196*     IMAGING STUDIES DG Chest 2 View  Result Date: 11/18/2020 CLINICAL DATA:  Cough EXAM: CHEST - 2 VIEW COMPARISON:  November 05, 2020 FINDINGS: There is atelectatic change in the lung bases. No edema or airspace opacity. Pacemaker present with lead tips in right atrium and right ventricle regions. Heart size and pulmonary vascularity are normal. No adenopathy. There is aortic atherosclerosis. There is calcification in the left carotid artery. There is degenerative change in the thoracic spine. Postoperative change noted in right shoulder. Evidence of prior trauma lateral right clavicle. IMPRESSION: Bibasilar atelectasis. No edema or airspace opacity. Heart size within normal limits. Pacemaker lead positions unchanged. Aortic Atherosclerosis (ICD10-I70.0). Electronically Signed   By: Lowella Grip III M.D.   On: 11/18/2020 13:20   DG Chest 2 View  Result Date: 11/05/2020 CLINICAL DATA:  Cough and  fatigue EXAM: CHEST - 2 VIEW COMPARISON:  September 18, 2020 FINDINGS: There is mild bibasilar scarring. No edema or airspace opacity. Heart size and pulmonary vascular normal. 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 No adenopathy. There is aortic atherosclerosis. Postoperative change noted in the proximal left humerus. Evidence of prior trauma lateral right clavicle. IMPRESSION: Slight bibasilar scarring. No edema or airspace opacity. Stable cardiac silhouette. 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 Aortic Atherosclerosis (ICD10-I70.0). Electronically Signed   By: Lowella Grip III M.D.   On: 11/05/2020 12:11   MR BRAIN WO CONTRAST  Result Date: 11/18/2020 CLINICAL DATA:  Altered mental status with increased left lower extremity weakness over baseline.  History of right MCA stroke. EXAM: MRI HEAD WITHOUT CONTRAST TECHNIQUE: Multiplanar, multiecho pulse sequences of the brain and surrounding structures were obtained without intravenous contrast. COMPARISON:  Head CT 11/18/2020 and MRI 09/10/2020 FINDINGS: The study is mildly to moderately motion degraded. Brain: There is a moderately large chronic posterior right MCA territory infarct with associated chronic blood products (acute to subacute in 09/2020). No acute infarct is identified. Chronic bilateral subdural hematomas measure up to 8 mm in thickness on the right and 9 mm on the left, unchanged from the prior MRI. There is no midline shift or other significant mass effect. Small T2 hyperintensities in the cerebral white matter bilaterally are unchanged from the prior MRI and are nonspecific but compatible with mild chronic small vessel ischemic disease. There is mild cerebral atrophy. Vascular: Major intracranial vascular flow voids are grossly preserved. Susceptibility artifact in the right sylvian fissure from a right MCA branch vessel stent. Skull and upper cervical spine: Left frontal craniotomy. Right frontal burr hole. Sinuses/Orbits: Bilateral cataract extraction. Mild bilateral ethmoid sinus mucosal thickening. Clear mastoid air cells. Other: None. IMPRESSION: 1. No acute intracranial abnormality. 2. Chronic right MCA territory infarct. 3. Chronic bilateral subdural hematomas, unchanged from 09/2020. 4. Mild chronic small vessel ischemic disease. Electronically Signed   By: Logan Bores M.D.   On: 11/18/2020 16:18   EEG adult  Result Date: 11/20/2020 Lora Havens, MD     11/20/2020  5:37 PM Patient Name: Uzair Maner MRN: [id number] Epilepsy Attending: Lora Havens Referring Physician/Provider: Dr Bonnielee Haff Date: 11/20/2020 Duration: 24 mins Patient history: 80yo M with prior R MCA stroke, now with  Hallucinations and fall. EEG to evaluate for seizure Level of alertness: Awake,  asleep AEDs during EEG study: LEV Technical aspects: This EEG study was done with scalp electrodes positioned according to the 10-20 International system of electrode placement. Electrical activity was acquired at a sampling rate of 500Hz  and reviewed with a high frequency filter of 70Hz  and a low frequency filter of 1Hz . EEG data were recorded continuously and digitally stored. Description: The posterior dominant rhythm consists of 8 Hz activity of moderate voltage (25-35 uV) seen predominantly in posterior head regions, asymmetric ( R<l) and reactive to eye opening and eye closing. Sleep was characterized by vertex waves, sleep spindles (12 to 14 Hz), maximal frontocentral region.  EEG showed continuous 5 to 8 Hz theta-alpha activity in left hemisphere admixed with 15 to 18 Hz beta activity in left central temporal region consistent with breach artifact.  There is also 3 to 5 Hz theta-delta slowing in right hemisphere. Sharp transients were seen in left centro-parietal region region.  Hyperventilation and photic stimulation were not performed.   ABNORMALITY - Continuous slow, generalized and lateralized right hemisphere - Breach artifact, left  centro- temporal region  IMPRESSION: This study is suggestive of cortical dysfunction in right hemisphere likely secondary to underlying encephalomalacia.  There is also evidence of breach artifact in left centro- temporal region consistent with prior craniotomy.  Additionally there is evidence of mild diffuse encephalopathy, nonspecific etiology.  No seizures or definite epileptiform discharges were seen throughout the recording.  Lora Havens   CUP PACEART REMOTE DEVICE CHECK  Result Date: 11/14/2020 Scheduled remote reviewed. Normal device function.  Next remote 91 days. Kathy Breach, RN, CCDS, [employer]  CT HEAD CODE STROKE WO CONTRAST  Result Date: 11/18/2020 CLINICAL DATA:  Code stroke. 80 year old male last known well at midnight. Right MCA  infarct with hemorrhagic transformation in March. Right MCA M2 stent. History of unresolved subdural hematomas. EXAM: CT HEAD WITHOUT CONTRAST TECHNIQUE: Contiguous axial images were obtained from the base of the skull through the vertex without intravenous contrast. COMPARISON:  Head CT 09/18/2020 and earlier. FINDINGS: Brain: Mixed density bilateral subdural hematomas are stable since March, measuring up to 8-9 mm in thickness bilaterally and most pronounced over the posterior convexities. Developing encephalomalacia throughout the posterior right MCA territory with resolved hyperdense hemorrhage and mass effect since March. Hypodensity in the posterior left corona radiata, posterior left internal capsule and medial left thalamus appears increased since March. Gray-white matter differentiation elsewhere appears stable. No acute cortically based infarct identified. Vascular: Mild Calcified atherosclerosis at the skull base. Right MCA branch stent in the sylvian fissure appears stable. Skull: Previous left superior convexity craniotomy, bilateral burr holes. No acute osseous abnormality identified. Sinuses/Orbits: Mildly improved ethmoid sinus mucosal thickening. Other Visualized paranasal sinuses and mastoids are stable and well aerated. Other: No acute orbit or scalp soft tissue findings. ASPECTS [facility] Stroke Program Early CT Score) Total score (0-10 with 10 being normal): 10 IMPRESSION: 1. No acute cortically based infarct or acute intracranial hemorrhage identified. ASPECTS 10. 2. 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 3. Questionable increased small vessel disease in the posterior limb left internal capsule and left thalamus since March. 4. Unresolved small bilateral mixed density subdural hematomas remain stable. Study discussed by telephone with Dr. Donnetta Simpers on 11/18/2020 at 09:28 . Electronically Signed   By: Genevie Ann  M.D.   On: 11/18/2020 09:30   CT ANGIO HEAD CODE STROKE  Result Date: 11/18/2020 CLINICAL DATA:  Code stroke. 80 year old male last known well at midnight. Right MCA infarct with hemorrhagic transformation in March. Right MCA M2 stent. History of unresolved subdural hematomas. EXAM: CT ANGIOGRAPHY HEAD AND NECK TECHNIQUE: Multidetector CT imaging of the head and neck was performed using the standard protocol during bolus administration of intravenous contrast. Multiplanar CT image reconstructions and MIPs were obtained to evaluate the vascular anatomy. Carotid stenosis measurements (when applicable) are obtained utilizing NASCET criteria, using the distal internal carotid diameter as the denominator. CONTRAST:  53mL OMNIPAQUE IOHEXOL 350 MG/ML SOLN COMPARISON:  Plain head CT 0919 hours today. CTA head and neck and CTP 09/09/20. FINDINGS: CTA NECK Skeleton: Stable.  No acute osseous abnormality identified. Upper chest: Left chest cardiac pacemaker redemonstrated. Patchy nonspecific upper lung opacity has not significantly changed since February and may be scarring. No superior mediastinal lymphadenopathy. Visible central pulmonary arteries appear patent. Other neck: Stable.  No acute finding. Aortic arch: Stable mild calcified arch atherosclerosis. Three vessel arch configuration. Right carotid system: Stable right CCA plaque without significant stenosis proximal to the bifurcation. Bulky calcified plaque at the  right ICA origin and bulb resulting in radiographic string sign stenosis appears stable on series 5, image 104. The right ICA remains patent to the skull base. Left carotid system: Stable left CCA, left carotid bifurcation, proximal left ICA, and mild distal left cervical ICA calcified plaque with less than 50% stenosis. Vertebral arteries: Stable, and notable for mild to moderate right vertebral artery origin stenosis due to calcified plaque. CTA HEAD Posterior circulation: Stable and negative. Mildly  dominant left V4 segment. Both posterior communicating arteries are small but present. Anterior circulation: Both ICA siphons remain patent. Calcified siphon atherosclerosis is stable, mild to moderate and not associated with significant siphon stenosis. Normal posterior communicating artery and ophthalmic artery origins. Patent carotid termini, MCA and ACA origins. Dominant left A1 as before. 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 Left MCA M1 and left MCA branches are stable and within normal limits. Right MCA M1 and right MCA bifurcation remain patent without stenosis. Posterior right M2 stent has been placed traversing the embolized calcification demonstrated in February. The stent appears patent and enhancing (series 15, image 10). Although posterior right MCA division branches distal to the stent remain diminutive or absent. Other right MCA branches appear stable. Venous sinuses: Patent. Anatomic variants: Dominant left ACA A1. Review of the MIP images confirms the above findings IMPRESSION: 1. Negative for emergent large vessel occlusion. 2. Stented posterior Right MCA branch where the embolized calcification was noted in February. The stent is patent. And other MCA branches remain stable. 3. 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 Electronically Signed   By: Genevie Ann M.D.   On: 11/18/2020 10:09   CT ANGIO NECK CODE STROKE  Result Date: 11/18/2020 CLINICAL DATA:  Code stroke. 80 year old male last known well at midnight. Right MCA infarct with hemorrhagic transformation in March. Right MCA M2 stent. History of unresolved subdural hematomas. EXAM: CT ANGIOGRAPHY HEAD AND NECK TECHNIQUE: Multidetector CT imaging of the head  and neck was performed using the standard protocol during bolus administration of intravenous contrast. Multiplanar CT image reconstructions and MIPs were obtained to evaluate the vascular anatomy. Carotid stenosis measurements (when applicable) are obtained utilizing NASCET criteria, using the distal internal carotid diameter as the denominator. CONTRAST:  58mL OMNIPAQUE IOHEXOL 350 MG/ML SOLN COMPARISON:  Plain head CT 0919 hours today. CTA head and neck and CTP 09/09/20. FINDINGS: CTA NECK Skeleton: Stable.  No acute osseous abnormality identified. Upper chest: Left chest cardiac pacemaker redemonstrated. Patchy nonspecific upper lung opacity has not significantly changed since February and may be scarring. No superior mediastinal lymphadenopathy. Visible central pulmonary arteries appear patent. Other neck: Stable.  No acute finding. Aortic arch: Stable mild calcified arch atherosclerosis. Three vessel arch configuration. Right carotid system: Stable right CCA plaque without significant stenosis proximal to the bifurcation. Bulky calcified plaque at the right ICA origin and bulb resulting in radiographic string sign stenosis appears stable on series 5, image 104. The right ICA remains patent to the skull base. Left carotid system: Stable left CCA, left carotid bifurcation, proximal left ICA, and mild distal left cervical ICA calcified plaque with less than 50% stenosis. Vertebral arteries: Stable, and notable for mild to moderate right vertebral artery origin stenosis due to calcified plaque. CTA HEAD Posterior circulation: Stable and negative. Mildly dominant  left V4 segment. Both posterior communicating arteries are small but present. Anterior circulation: Both ICA siphons remain patent. Calcified siphon atherosclerosis is stable, mild to moderate and not associated with significant siphon stenosis. Normal posterior communicating artery and ophthalmic artery origins. Patent carotid termini, MCA and ACA origins.  Dominant left A1 as before. 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 Left MCA M1 and left MCA branches are stable and within normal limits. Right MCA M1 and right MCA bifurcation remain patent without stenosis. Posterior right M2 stent has been placed traversing the embolized calcification demonstrated in February. The stent appears patent and enhancing (series 15, image 10). Although posterior right MCA division branches distal to the stent remain diminutive or absent. Other right MCA branches appear stable. Venous sinuses: Patent. Anatomic variants: Dominant left ACA A1. Review of the MIP images confirms the above findings IMPRESSION: 1. Negative for emergent large vessel occlusion. 2. Stented posterior Right MCA branch where the embolized calcification was noted in February. The stent is patent. And other MCA branches remain stable. 3. 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 Electronically Signed   By: Odessa Fleming M.D.   On: 11/18/2020 10:09    DISCHARGE EXAMINATION: Vitals:   11/21/20 1953 11/22/20 0004 11/22/20 0326 11/22/20 0745  BP:   (!) 162/82 133/81  Pulse:  84    Resp:  16    Temp: 98.7 F (37.1 C) 98.9 F (37.2 C) 98.9 F (37.2 C) 99 F (37.2 C)  TempSrc: Oral Axillary Axillary Oral  SpO2: 93% 92%    Weight:      Height:       General appearance: Awake alert.  In no distress Resp: Clear to auscultation bilaterally.  Normal effort Cardio: S1-S2 is normal regular.  No S3-S4.  No rubs murmurs or bruit GI: Abdomen is soft.  Nontender nondistended.  Bowel sounds are present normal.  No masses organomegaly     DISPOSITION: SNF  Discharge Instructions    Call MD for:  difficulty breathing, headache or  visual disturbances   Complete by: As directed    Call MD for:  extreme fatigue   Complete by: As directed    Call MD for:  persistant dizziness or light-headedness   Complete by: As directed    Call MD for:  persistant nausea and vomiting   Complete by: As directed    Call MD for:  severe uncontrolled pain   Complete by: As directed    Call MD for:  temperature >100.4   Complete by: As directed    Diet - low sodium heart healthy   Complete by: As directed    Discharge instructions   Complete by: As directed    Please review instructions on the discharge summary.  You were cared for by a hospitalist during your hospital stay. If you have any questions about your discharge medications or the care you received while you were in the hospital after you are discharged, you can call the unit and asked to speak with the hospitalist on call if the hospitalist that took care of you is not available. Once you are discharged, your primary care physician will handle any further medical issues. Please note that NO REFILLS for any discharge medications will be authorized once you are  discharged, as it is imperative that you return to your primary care physician (or establish a relationship with a primary care physician if you do not have one) for your aftercare needs so that they can reassess your need for medications and monitor your lab values. If you do not have a primary care physician, you can call [phone] for a physician referral.   Increase activity slowly   Complete by: As directed         Allergies as of 11/22/2020      Reactions   Lisinopril Cough   Invokana [canagliflozin] Other (See Comments)   Stopped by MD   Penicillins Other (See Comments)   Allergic as a child.  Patient does not remember reaction.  Has had cephalasporins without problems.   Sulfamethoxazole Other (See Comments)   Unknown reaction- from childhood      Medication List    STOP taking these medications    HYDROcodone bit-homatropine 5-1.5 MG/5ML syrup Commonly known as: HYCODAN     TAKE these medications   ACCRUFeR 30 MG Caps Generic drug: Ferric Maltol Take 1 capsule by mouth in the morning and at bedtime. What changed: how much to take   amLODipine 10 MG tablet Commonly known as: NORVASC Take 1 tablet (10 mg total) by mouth daily.   aspirin 81 MG chewable tablet Chew 1 tablet (81 mg total) by mouth daily.   atorvastatin 10 MG tablet Commonly known as: LIPITOR Take 1 tablet (10 mg total) by mouth daily.   benzonatate 100 MG capsule Commonly known as: TESSALON Take 1 capsule (100 mg total) by mouth 3 (three) times daily.   cephALEXin 500 MG capsule Commonly known as: KEFLEX Take 1 capsule (500 mg total) by mouth every 8 (eight) hours for 4 days.   cloNIDine 0.1 mg/24hr patch Commonly known as: CATAPRES - Dosed in mg/24 hr Place 1 patch (0.1 mg total) onto the skin once a week.   FLUoxetine 20 MG capsule Commonly known as: PROZAC TAKE 1 CAPSULE BY MOUTH  DAILY   fluticasone 50 MCG/ACT nasal spray Commonly known as: FLONASE Place 1 spray into both nostrils daily.   glipiZIDE 10 MG 24 hr tablet Commonly known as: GLUCOTROL XL TAKE 1 TABLET BY MOUTH  DAILY What changed: when to take this   guaiFENesin 100 MG/5ML Soln Commonly known as: ROBITUSSIN Take 5 mLs (100 mg total) by mouth every 4 (four) hours as needed for cough or to loosen phlegm.   Gvoke HypoPen 2-Pack 1 MG/0.2ML Soaj Generic drug: Glucagon Inject 1 Act into the skin daily as needed. What changed:   how much to take  when to take this  reasons to take this   hydrocortisone cream 1 % Apply 1 application topically 4 (four) times daily as needed for itching.   Insulin Pen Needle 32G X 6 MM Misc 1 Act by Does not apply route daily.   levETIRAcetam 500 MG tablet Commonly known as: KEPPRA Take 500 mg by mouth 2 (two) times daily. What changed: Another medication with the same name was removed.  Continue taking this medication, and follow the directions you see here.   loratadine 10 MG tablet Commonly known as: CLARITIN Take 1 tablet (10 mg total) by mouth every evening.   pantoprazole 40 MG tablet Commonly known as: PROTONIX TAKE 1 TABLET BY MOUTH  DAILY   PRESCRIPTION MEDICATION CPAP- At bedtime   senna-docusate 8.6-50 MG tablet Commonly known as: Senokot-S Take 1 tablet by mouth at bedtime as  needed for mild constipation.   solifenacin 10 MG tablet Commonly known as: VESICARE Take 1 tablet (10 mg total) by mouth daily.   ticagrelor 90 MG Tabs tablet Commonly known as: BRILINTA Take 0.5 tablets (45 mg total) by mouth 2 (two) times daily.   Toujeo Max SoloStar 300 UNIT/ML Solostar Pen Generic drug: insulin glargine (2 Unit Dial) Inject 10 Units into the skin daily.          TOTAL DISCHARGE TIME: 35 minutes  Elis Rawlinson [employer] on [email or web address]  11/22/2020, 8:19 AM

## 2020-11-23 LAB — BLOOD GAS, ARTERIAL
Acid-Base Excess: 3.1 mmol/L — ABNORMAL HIGH (ref 0.0–2.0)
Bicarbonate: 27.5 mmol/L (ref 20.0–28.0)
Drawn by: 28338
FIO2: 28
O2 Saturation: 97 %
Patient temperature: 37
pCO2 arterial: 45.1 mmHg (ref 32.0–48.0)
pH, Arterial: 7.402 (ref 7.350–7.450)
pO2, Arterial: 92.1 mmHg (ref 83.0–108.0)

## 2020-11-23 LAB — GLUCOSE, CAPILLARY
Glucose-Capillary: 155 mg/dL — ABNORMAL HIGH (ref 70–99)
Glucose-Capillary: 225 mg/dL — ABNORMAL HIGH (ref 70–99)
Glucose-Capillary: 209 mg/dL — ABNORMAL HIGH (ref 70–99)
Glucose-Capillary: 179 mg/dL — ABNORMAL HIGH (ref 70–99)
Glucose-Capillary: 151 mg/dL — ABNORMAL HIGH (ref 70–99)
Glucose-Capillary: 143 mg/dL — ABNORMAL HIGH (ref 70–99)

## 2020-11-23 LAB — SARS CORONAVIRUS 2 (TAT 6-24 HRS): SARS Coronavirus 2: NEGATIVE

## 2020-11-23 NOTE — Progress Notes (Signed)
Called rapid response nurse Saralyn Pilar for second opinion on patient condition. Patient has been alert & oriented x4 today, verbose, and not lethargic. Pt was up in the chair for 3-4 hours to eat lunch; family visited and no issues arose. Around 14:45 pt called to get back in bed, was assisted back to bed with myself & NT [place] using gait belt and walker. Pt stated he was very tired and wanted to rest. About 1 hr later I went to check on pt and he was very lethargic and difficult to arouse without continuous stimulation. CBG 179, BP 130s/70s, 100% O2 on 2 L [place], pulse 70s, RR 20s. Pt was able to move all extremities. Saralyn Pilar came to perform assessment and pt became slightly more aroused, decided to give about an hour for patient to awaken. Reassessment of pt at 1800 and pt was very lethargic and less verbal than previous hour. Called Saralyn Pilar back to ask for ABG. Notified Dr. [place] Pink of change in condition who stated perhaps symptoms due to COVID-19 vaccine received yesterday. Will continue to monitor pt. No further testing ordered at this time.

## 2020-11-23 NOTE — Significant Event (Signed)
Rapid Response Event Note   Reason for Call :  Decreased LOC  Initial Focused Assessment:  Called by the RN because she was concerned that the patient is more sleepy than normal and harder to arouse.  Patient was asleep in the bed on my arrival.  He was arousable to my voice but kept trying to fall asleep during my exam. He answered questions appropriately.   The patient did have a desaturation event this afternoon around 1500. He dropped to 88 and his o2 recovered quickly after getting 2L O2 [place]. The patient has a week cough.  VS stable.  Patient did display a intermittent tremor in his upper extremities. He did demonstrate some asterixis.    BP 102/59 Ecg 75 O2 100 RR 21  CBG 179    Interventions:  Vital signs collected  Plan of Care:  Initially we were going to see if patient would wake up on his own. According to RN, the patient is harder to arouse now after approximately an hour and a half passed. ABG ordered  Event Summary:   MD Notified:  Call Time: 1652 Arrival Time: 9702 End Time: [place], RN

## 2020-11-23 NOTE — Progress Notes (Signed)
Pt has home CPAP at bedside and asked for RT to place water in it. RT filled water chamber up with sterile water per patient request and pt needs no further assistance per pt.

## 2020-11-23 NOTE — Discharge Summary (Signed)
[facility]  Physician Discharge Summary   Patient ID: Frank [name] MRN: [id number] DOB/AGE: 82-Jul-1942 82 y.o.  Admit date: 11/18/2020 Discharge date: 11/23/2020  PCP: Janith Lima, MD  DISCHARGE DIAGNOSES:  Left leg weakness likely due to recrudescence of prior stroke due to UTI Acute urinary tract infection with E. 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  He had been discharged on DAPT (aspirin and Brilinta), however he ran out of Brilinta 2 weeks PTA and unable to refill.  In the ED, code stroke was called.  Neurology evaluated, CTH, CTA with no new stroke, no LVO, suspecting TIA versus recrudescence due to infectious/metabolic abnormalities and less likely due to  seizures.  Consultations:  Neurology  Procedures:  EEG  HOSPITAL COURSE:   Left leg weakness likely due to recrudescence of prior stroke due to UTI/stroke was ruled out. History as noted above.  Code stroke was activated in ED.  CT head and CTA head without new stroke and no large vessel occlusion but showed prior R MCA stroke.  MRI brain shows no acute intracranial abnormality, chronic right MCA territory infarct and chronic bilateral subdural hematomas, unchanged from 3/22.   EEG was done which did not show any acute epileptiform activity. Per neurology the presentation is likely due to TIA (primary diagnosis) versus recrudescence of prior strokes versus seizures (less likely).   TTE recently on 09/09/2020 with preserved LVEF and grade 1 diastolic dysfunction.  LDL 53.  A1c 7.7. TSH: 0.459.   Continuing aspirin 81 Mg daily and Brilinta 45 Mg twice daily.  Continue atorvastatin 10 Mg daily.  PT recommends SNF.   ST recommends dysphagia 2 and nectar thickened liquids.   Patient has significantly improved though he remains physically deconditioned and would benefit from short-term rehab.  Acute metabolic encephalopathy Likely related to acute cystitis complicating possible underlying vascular dementia and CKD.  No acute stroke on imaging.  UDS positive for opiates and recently started codeine cough syrup could certainly have contributed to his presentation and opioids should be avoided. He is back to his baseline now.  E. coli acute cystitis without hematuria: Urine microscopy suggestive of UTI and urine culture shows >100 K colonies of E. Coli.    Patient was initially placed on ceftriaxone.  Changed over to cephalexin.  Dysphagia Speech therapy evaluated and recommended dysphagia 2 diet and nectar thickened liquids.  Strict aspiration precautions.  Transient hypoxia Chest x-ray showed bilateral atelectasis.  Concern regarding chronic aspiration.  Diet per speech therapy as noted  above.  No clinical or radiological pneumonia at this time.    CKD stage IIIa Serum creatinine was in the 1.3-1.4 range in March.    Creatinine has been fluctuating.  Likely has experienced some progression of CKD.  Stable for the most part.  Type II DM with renal complications Continue Lantus, SSI and adjust insulins as needed.  OSA on nightly CPAP.  History of TBI Keppra resumed in March.  Essential hypertension Continue current medications.  Anemia in CKD Stable.  Chronic cough secondary to postnasal drip. Continue antitussive agents as well as Flonase.  Goals of care Patient's spouse discussed with [facility] team and requesting palliative care consult.  Seen by palliative care.  Obesity Estimated body mass index is 30.27 kg/m as calculated from the following:   Height as of this encounter: 5\' 9"  (1.753 m).   Weight as of this encounter: 93 kg.  Patient is stable.  Okay for discharge to SNF when bed is available.   PERTINENT LABS:  The results of significant diagnostics from this hospitalization (including imaging, microbiology, ancillary and laboratory) are listed below for reference.    Microbiology: Recent Results (from the past 240 hour(s))  Resp Panel by RT-PCR (Flu A&B, Covid) Nasopharyngeal Swab     Status: None   Collection Time: 11/18/20  9:54 AM   Specimen: Nasopharyngeal Swab; Nasopharyngeal(NP) swabs in vial transport medium  Result Value Ref Range Status   SARS Coronavirus 2 by RT PCR NEGATIVE NEGATIVE Final    Comment: (NOTE) SARS-CoV-2 target nucleic acids are NOT DETECTED.  The SARS-CoV-2 RNA is generally detectable in upper respiratory specimens during the acute phase of infection. The lowest concentration of SARS-CoV-2 viral copies this assay can detect is 138 copies/mL. A negative result does not preclude SARS-Cov-2 infection and should not be used as the sole basis for treatment or other patient management decisions. A negative result may  occur with  improper specimen collection/handling, submission of specimen other than nasopharyngeal swab, presence of viral mutation(s) within the areas targeted by this assay, and inadequate number of viral copies(<138 copies/mL). A negative result must be combined with clinical observations, patient history, and epidemiological information. The expected result is Negative.  Fact Sheet for Patients:  EntrepreneurPulse.com.au  Fact Sheet for Healthcare Providers:  IncredibleEmployment.be  This test is no t yet approved or cleared by the [place] FDA and  has been authorized for detection and/or diagnosis of SARS-CoV-2 by FDA under an Emergency Use Authorization (EUA). This EUA will remain  in effect (meaning this test can be used) for the duration of the COVID-19 declaration under Section 564(b)(1) of the Act, 21 U.S.C.section 360bbb-3(b)(1), unless the authorization is terminated  or revoked sooner.       Influenza A by PCR NEGATIVE NEGATIVE Final   Influenza B by PCR NEGATIVE NEGATIVE Final    Comment: (NOTE) The Xpert Xpress SARS-CoV-2/FLU/RSV plus assay is intended as an aid in the diagnosis of influenza from Nasopharyngeal swab specimens and should not be used as a sole basis for treatment. Nasal washings and aspirates are unacceptable for Xpert Xpress SARS-CoV-2/FLU/RSV testing.  Fact Sheet for Patients: EntrepreneurPulse.com.au  Fact Sheet for Healthcare Providers: IncredibleEmployment.be  This test is not yet approved or cleared by the [place] FDA and has been authorized for detection and/or diagnosis of SARS-CoV-2 by FDA under an Emergency Use Authorization (EUA). This EUA will remain in effect (meaning this test can be used) for the duration of the COVID-19 declaration under Section 564(b)(1) of the Act,  21 U.S.C. section 360bbb-3(b)(1), unless the authorization is terminated  or revoked.  Performed at [facility], [place] [address] [place], [place] [address]   Culture, Urine     Status: Abnormal   Collection Time: 11/18/20 11:56 AM   Specimen: Urine, Catheterized  Result Value Ref Range Status   Specimen Description URINE, CATHETERIZED  Final   Special Requests   Final    NONE Performed at [facility], [place] [address] [place], [place] [address]    Culture >=100,000 COLONIES/mL ESCHERICHIA COLI (A)  Final   Report Status 11/20/2020 FINAL  Final   Organism ID, Bacteria ESCHERICHIA COLI (A)  Final      Susceptibility   Escherichia coli - MIC*    AMPICILLIN >=32 RESISTANT Resistant     CEFAZOLIN <=4 SENSITIVE Sensitive     CEFEPIME <=0.12 SENSITIVE Sensitive     CEFTRIAXONE <=0.25 SENSITIVE Sensitive     CIPROFLOXACIN <=0.25 SENSITIVE Sensitive     GENTAMICIN <=1 SENSITIVE Sensitive     IMIPENEM <=0.25 SENSITIVE Sensitive     NITROFURANTOIN <=16 SENSITIVE Sensitive     TRIMETH/SULFA >=320 RESISTANT Resistant     AMPICILLIN/SULBACTAM 16 INTERMEDIATE Intermediate     PIP/TAZO <=4 SENSITIVE Sensitive     * >=100,000 COLONIES/mL ESCHERICHIA COLI     Labs:  COVID-19 Labs   Lab Results  Component Value Date   SARSCOV2NAA NEGATIVE 11/18/2020   [place] NEGATIVE 11/06/2020   [place] NEGATIVE 09/17/2020   [place] NEGATIVE 09/09/2020      Basic Metabolic Panel: Recent Labs  Lab 11/18/20 0902 11/18/20 1002 11/19/20 0324 11/20/20 0257 11/21/20 0256  NA 136 135 137 139 137  K 4.1 4.1 4.0 3.7 3.7  CL 101 100 100 106 101  CO2 27  --  28 27 27   GLUCOSE 235* 213* 212* 128* 128*  BUN 30* 31* 30* 24* 24*  CREATININE 1.81* 1.60* 1.55* 1.38* 1.54*  CALCIUM 9.1  --  9.2 8.8* 8.9   Liver Function Tests: Recent Labs  Lab 11/18/20 0902  AST 18  ALT 15  ALKPHOS 111  BILITOT 0.8  PROT 6.7  ALBUMIN 3.3*   CBC: Recent Labs  Lab 11/18/20 0902 11/18/20 1002 11/20/20 0257  WBC 8.1  --  6.1  NEUTROABS 6.0  --   --    HGB 10.6* 9.9* 9.9*  HCT 34.5* 29.0* 31.8*  MCV 85.6  --  84.8  PLT 193  --  183   BNP: BNP (last 3 results) Recent Labs    05/14/20 1329  BNP 393.4*    CBG: Recent Labs  Lab 11/22/20 0634 11/22/20 1229 11/22/20 1605 11/22/20 2117 11/23/20 0619  GLUCAP 196* 257* 184* 157* 155*     IMAGING STUDIES DG Chest 2 View  Result Date: 11/18/2020 CLINICAL DATA:  Cough EXAM: CHEST - 2 VIEW COMPARISON:  November 05, 2020 FINDINGS: There is atelectatic change in the lung bases. No edema or airspace opacity. Pacemaker present with lead tips in right atrium and right ventricle regions. Heart size and pulmonary vascularity are normal. No adenopathy. There is aortic atherosclerosis. There is calcification in the left carotid artery. There is degenerative change in the thoracic spine. Postoperative change noted in right shoulder. Evidence of prior trauma lateral right clavicle. IMPRESSION: Bibasilar atelectasis. No edema or airspace opacity. Heart size within normal limits. Pacemaker lead positions unchanged. Aortic Atherosclerosis (ICD10-I70.0). Electronically Signed   By: Lowella Grip III M.D.   On: 11/18/2020 13:20   DG  Chest 2 View  Result Date: 11/05/2020 CLINICAL DATA:  Cough and fatigue EXAM: CHEST - 2 VIEW COMPARISON:  September 18, 2020 FINDINGS: There is mild bibasilar scarring. No edema or airspace opacity. Heart size and pulmonary vascular normal. 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 No adenopathy. There is aortic atherosclerosis. Postoperative change noted in the proximal left humerus. Evidence of prior trauma lateral right clavicle. IMPRESSION: Slight bibasilar scarring. No edema or airspace opacity. Stable cardiac silhouette. 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 Aortic Atherosclerosis (ICD10-I70.0). Electronically Signed   By: Lowella Grip III M.D.   On: 11/05/2020 12:11   MR BRAIN WO CONTRAST  Result Date: 11/18/2020 CLINICAL DATA:   Altered mental status with increased left lower extremity weakness over baseline. History of right MCA stroke. EXAM: MRI HEAD WITHOUT CONTRAST TECHNIQUE: Multiplanar, multiecho pulse sequences of the brain and surrounding structures were obtained without intravenous contrast. COMPARISON:  Head CT 11/18/2020 and MRI 09/10/2020 FINDINGS: The study is mildly to moderately motion degraded. Brain: There is a moderately large chronic posterior right MCA territory infarct with associated chronic blood products (acute to subacute in 09/2020). No acute infarct is identified. Chronic bilateral subdural hematomas measure up to 8 mm in thickness on the right and 9 mm on the left, unchanged from the prior MRI. There is no midline shift or other significant mass effect. Small T2 hyperintensities in the cerebral white matter bilaterally are unchanged from the prior MRI and are nonspecific but compatible with mild chronic small vessel ischemic disease. There is mild cerebral atrophy. Vascular: Major intracranial vascular flow voids are grossly preserved. Susceptibility artifact in the right sylvian fissure from a right MCA branch vessel stent. Skull and upper cervical spine: Left frontal craniotomy. Right frontal burr hole. Sinuses/Orbits: Bilateral cataract extraction. Mild bilateral ethmoid sinus mucosal thickening. Clear mastoid air cells. Other: None. IMPRESSION: 1. No acute intracranial abnormality. 2. Chronic right MCA territory infarct. 3. Chronic bilateral subdural hematomas, unchanged from 09/2020. 4. Mild chronic small vessel ischemic disease. Electronically Signed   By: Logan Bores M.D.   On: 11/18/2020 16:18   EEG adult  Result Date: 11/20/2020 Lora Havens, MD     11/20/2020  5:37 PM Patient Name: Emmerich Padalino MRN: [id number] Epilepsy Attending: Lora Havens Referring Physician/Provider: Dr Bonnielee Haff Date: 11/20/2020 Duration: 24 mins Patient history: 80yo M with prior R MCA stroke, now with   Hallucinations and fall. EEG to evaluate for seizure Level of alertness: Awake, asleep AEDs during EEG study: LEV Technical aspects: This EEG study was done with scalp electrodes positioned according to the 10-20 International system of electrode placement. Electrical activity was acquired at a sampling rate of 500Hz  and reviewed with a high frequency filter of 70Hz  and a low frequency filter of 1Hz . EEG data were recorded continuously and digitally stored. Description: The posterior dominant rhythm consists of 8 Hz activity of moderate voltage (25-35 uV) seen predominantly in posterior head regions, asymmetric ( R<l) and reactive to eye opening and eye closing. Sleep was characterized by vertex waves, sleep spindles (12 to 14 Hz), maximal frontocentral region.  EEG showed continuous 5 to 8 Hz theta-alpha activity in left hemisphere admixed with 15 to 18 Hz beta activity in left central temporal region consistent with breach artifact.  There is also 3 to 5 Hz theta-delta slowing in right hemisphere. Sharp transients were seen in left centro-parietal region region.  Hyperventilation and photic stimulation were not performed.   ABNORMALITY -  Continuous slow, generalized and lateralized right hemisphere - Breach artifact, left centro- temporal region  IMPRESSION: This study is suggestive of cortical dysfunction in right hemisphere likely secondary to underlying encephalomalacia.  There is also evidence of breach artifact in left centro- temporal region consistent with prior craniotomy.  Additionally there is evidence of mild diffuse encephalopathy, nonspecific etiology.  No seizures or definite epileptiform discharges were seen throughout the recording.  Lora Havens   CUP PACEART REMOTE DEVICE CHECK  Result Date: 11/14/2020 Scheduled remote reviewed. Normal device function.  Next remote 91 days. Kathy Breach, RN, CCDS, [employer]  CT HEAD CODE STROKE WO CONTRAST  Result Date: 11/18/2020 CLINICAL  DATA:  Code stroke. 80 year old male last known well at midnight. Right MCA infarct with hemorrhagic transformation in March. Right MCA M2 stent. History of unresolved subdural hematomas. EXAM: CT HEAD WITHOUT CONTRAST TECHNIQUE: Contiguous axial images were obtained from the base of the skull through the vertex without intravenous contrast. COMPARISON:  Head CT 09/18/2020 and earlier. FINDINGS: Brain: Mixed density bilateral subdural hematomas are stable since March, measuring up to 8-9 mm in thickness bilaterally and most pronounced over the posterior convexities. Developing encephalomalacia throughout the posterior right MCA territory with resolved hyperdense hemorrhage and mass effect since March. Hypodensity in the posterior left corona radiata, posterior left internal capsule and medial left thalamus appears increased since March. Gray-white matter differentiation elsewhere appears stable. No acute cortically based infarct identified. Vascular: Mild Calcified atherosclerosis at the skull base. Right MCA branch stent in the sylvian fissure appears stable. Skull: Previous left superior convexity craniotomy, bilateral burr holes. No acute osseous abnormality identified. Sinuses/Orbits: Mildly improved ethmoid sinus mucosal thickening. Other Visualized paranasal sinuses and mastoids are stable and well aerated. Other: No acute orbit or scalp soft tissue findings. ASPECTS [facility] Stroke Program Early CT Score) Total score (0-10 with 10 being normal): 10 IMPRESSION: 1. No acute cortically based infarct or acute intracranial hemorrhage identified. ASPECTS 10. 2. 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 3. Questionable increased small vessel disease in the posterior limb left internal capsule and left thalamus since March. 4. Unresolved small bilateral mixed density subdural hematomas remain stable. Study discussed by telephone with Dr.  Donnetta Simpers on 11/18/2020 at 09:28 . Electronically Signed   By: Genevie Ann M.D.   On: 11/18/2020 09:30   CT ANGIO HEAD CODE STROKE  Result Date: 11/18/2020 CLINICAL DATA:  Code stroke. 80 year old male last known well at midnight. Right MCA infarct with hemorrhagic transformation in March. Right MCA M2 stent. History of unresolved subdural hematomas. EXAM: CT ANGIOGRAPHY HEAD AND NECK TECHNIQUE: Multidetector CT imaging of the head and neck was performed using the standard protocol during bolus administration of intravenous contrast. Multiplanar CT image reconstructions and MIPs were obtained to evaluate the vascular anatomy. Carotid stenosis measurements (when applicable) are obtained utilizing NASCET criteria, using the distal internal carotid diameter as the denominator. CONTRAST:  29mL OMNIPAQUE IOHEXOL 350 MG/ML SOLN COMPARISON:  Plain head CT 0919 hours today. CTA head and neck and CTP 09/09/20. FINDINGS: CTA NECK Skeleton: Stable.  No acute osseous abnormality identified. Upper chest: Left chest cardiac pacemaker redemonstrated. Patchy nonspecific upper lung opacity has not significantly changed since February and may be scarring. No superior mediastinal lymphadenopathy. Visible central pulmonary arteries appear patent. Other neck: Stable.  No acute finding. Aortic arch: Stable mild calcified arch atherosclerosis. Three vessel arch configuration. Right carotid system: Stable right CCA plaque without  significant stenosis proximal to the bifurcation. Bulky calcified plaque at the right ICA origin and bulb resulting in radiographic string sign stenosis appears stable on series 5, image 104. The right ICA remains patent to the skull base. Left carotid system: Stable left CCA, left carotid bifurcation, proximal left ICA, and mild distal left cervical ICA calcified plaque with less than 50% stenosis. Vertebral arteries: Stable, and notable for mild to moderate right vertebral artery origin stenosis due to  calcified plaque. CTA HEAD Posterior circulation: Stable and negative. Mildly dominant left V4 segment. Both posterior communicating arteries are small but present. Anterior circulation: Both ICA siphons remain patent. Calcified siphon atherosclerosis is stable, mild to moderate and not associated with significant siphon stenosis. Normal posterior communicating artery and ophthalmic artery origins. Patent carotid termini, MCA and ACA origins. Dominant left A1 as before. 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 Left MCA M1 and left MCA branches are stable and within normal limits. Right MCA M1 and right MCA bifurcation remain patent without stenosis. Posterior right M2 stent has been placed traversing the embolized calcification demonstrated in February. The stent appears patent and enhancing (series 15, image 10). Although posterior right MCA division branches distal to the stent remain diminutive or absent. Other right MCA branches appear stable. Venous sinuses: Patent. Anatomic variants: Dominant left ACA A1. Review of the MIP images confirms the above findings IMPRESSION: 1. Negative for emergent large vessel occlusion. 2. Stented posterior Right MCA branch where the embolized calcification was noted in February. The stent is patent. And other MCA branches remain stable. 3. 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 Electronically Signed   By: Genevie Ann M.D.   On: 11/18/2020 10:09   CT ANGIO NECK CODE STROKE  Result Date: 11/18/2020 CLINICAL DATA:  Code stroke. 80 year old male last known well at midnight. Right MCA infarct with hemorrhagic transformation in March. Right MCA M2 stent. History of unresolved subdural hematomas. EXAM:  CT ANGIOGRAPHY HEAD AND NECK TECHNIQUE: Multidetector CT imaging of the head and neck was performed using the standard protocol during bolus administration of intravenous contrast. Multiplanar CT image reconstructions and MIPs were obtained to evaluate the vascular anatomy. Carotid stenosis measurements (when applicable) are obtained utilizing NASCET criteria, using the distal internal carotid diameter as the denominator. CONTRAST:  54mL OMNIPAQUE IOHEXOL 350 MG/ML SOLN COMPARISON:  Plain head CT 0919 hours today. CTA head and neck and CTP 09/09/20. FINDINGS: CTA NECK Skeleton: Stable.  No acute osseous abnormality identified. Upper chest: Left chest cardiac pacemaker redemonstrated. Patchy nonspecific upper lung opacity has not significantly changed since February and may be scarring. No superior mediastinal lymphadenopathy. Visible central pulmonary arteries appear patent. Other neck: Stable.  No acute finding. Aortic arch: Stable mild calcified arch atherosclerosis. Three vessel arch configuration. Right carotid system: Stable right CCA plaque without significant stenosis proximal to the bifurcation. Bulky calcified plaque at the right ICA origin and bulb resulting in radiographic string sign stenosis appears stable on series 5, image 104. The right ICA remains patent to the skull base. Left carotid system: Stable left CCA, left carotid bifurcation, proximal left ICA, and mild distal left cervical ICA calcified plaque with less than 50% stenosis. Vertebral arteries: Stable, and notable for mild to moderate right vertebral artery origin stenosis due to  calcified plaque. CTA HEAD Posterior circulation: Stable and negative. Mildly dominant left V4 segment. Both posterior communicating arteries are small but present. Anterior circulation: Both ICA siphons remain patent. Calcified siphon atherosclerosis is stable, mild to moderate and not associated with significant siphon stenosis. Normal posterior communicating  artery and ophthalmic artery origins. Patent carotid termini, MCA and ACA origins. Dominant left A1 as before. 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 Left MCA M1 and left MCA branches are stable and within normal limits. Right MCA M1 and right MCA bifurcation remain patent without stenosis. Posterior right M2 stent has been placed traversing the embolized calcification demonstrated in February. The stent appears patent and enhancing (series 15, image 10). Although posterior right MCA division branches distal to the stent remain diminutive or absent. Other right MCA branches appear stable. Venous sinuses: Patent. Anatomic variants: Dominant left ACA A1. Review of the MIP images confirms the above findings IMPRESSION: 1. Negative for emergent large vessel occlusion. 2. Stented posterior Right MCA branch where the embolized calcification was noted in February. The stent is patent. And other MCA branches remain stable. 3. 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 Electronically Signed   By: Genevie Ann M.D.   On: 11/18/2020 10:09    DISCHARGE EXAMINATION: Vitals:   11/22/20 2002 11/22/20 2352 11/23/20 0342 11/23/20 0745  BP: (!) 156/66 (!) 158/63 (!) 159/76 (!) 151/77  Pulse: 77 74 87 86  Resp: 18 16 18 16   Temp: 98.8 F (37.1 C) 100.1 F (37.8 C) 98.9 F (37.2 C) 98.2 F (36.8 C)  TempSrc: Oral Oral Oral Oral  SpO2: 90% 97% 92% 94%  Weight:      Height:       General appearance: Awake alert.  In no distress Resp: Clear to auscultation bilaterally.  Normal effort Cardio: S1-S2 is normal regular.  No S3-S4.  No rubs murmurs or bruit GI: Abdomen is soft.  Nontender nondistended.  Bowel sounds are present normal.  No  masses organomegaly     DISPOSITION: SNF  Discharge Instructions    Call MD for:  difficulty breathing, headache or visual disturbances   Complete by: As directed    Call MD for:  extreme fatigue   Complete by: As directed    Call MD for:  persistant dizziness or light-headedness   Complete by: As directed    Call MD for:  persistant nausea and vomiting   Complete by: As directed    Call MD for:  severe uncontrolled pain   Complete by: As directed    Call MD for:  temperature >100.4   Complete by: As directed    Diet - low sodium heart healthy   Complete by: As directed    Discharge instructions   Complete by: As directed    Please review instructions on the discharge summary.  You were cared for by a hospitalist during your hospital stay. If you have any questions about your discharge medications or the care you received while you were in the hospital after you are discharged, you can call the unit and asked to speak with the hospitalist on call if the hospitalist that took care of you is not available. Once you are discharged, your primary care physician will handle any further medical issues. Please  note that NO REFILLS for any discharge medications will be authorized once you are discharged, as it is imperative that you return to your primary care physician (or establish a relationship with a primary care physician if you do not have one) for your aftercare needs so that they can reassess your need for medications and monitor your lab values. If you do not have a primary care physician, you can call [phone] for a physician referral.   Increase activity slowly   Complete by: As directed         Allergies as of 11/23/2020      Reactions   Lisinopril Cough   Invokana [canagliflozin] Other (See Comments)   Stopped by MD   Penicillins Other (See Comments)   Allergic as a child.  Patient does not remember reaction.  Has had cephalasporins without problems.   Sulfamethoxazole  Other (See Comments)   Unknown reaction- from childhood      Medication List    STOP taking these medications   HYDROcodone bit-homatropine 5-1.5 MG/5ML syrup Commonly known as: HYCODAN     TAKE these medications   ACCRUFeR 30 MG Caps Generic drug: Ferric Maltol Take 1 capsule by mouth in the morning and at bedtime. What changed: how much to take   amLODipine 10 MG tablet Commonly known as: NORVASC Take 1 tablet (10 mg total) by mouth daily.   aspirin 81 MG chewable tablet Chew 1 tablet (81 mg total) by mouth daily.   atorvastatin 10 MG tablet Commonly known as: LIPITOR Take 1 tablet (10 mg total) by mouth daily.   benzonatate 100 MG capsule Commonly known as: TESSALON Take 1 capsule (100 mg total) by mouth 3 (three) times daily.   cephALEXin 500 MG capsule Commonly known as: KEFLEX Take 1 capsule (500 mg total) by mouth every 8 (eight) hours for 4 days.   cloNIDine 0.1 mg/24hr patch Commonly known as: CATAPRES - Dosed in mg/24 hr Place 1 patch (0.1 mg total) onto the skin once a week.   FLUoxetine 20 MG capsule Commonly known as: PROZAC TAKE 1 CAPSULE BY MOUTH  DAILY   fluticasone 50 MCG/ACT nasal spray Commonly known as: FLONASE Place 1 spray into both nostrils daily.   glipiZIDE 10 MG 24 hr tablet Commonly known as: GLUCOTROL XL TAKE 1 TABLET BY MOUTH  DAILY What changed: when to take this   guaiFENesin 100 MG/5ML Soln Commonly known as: ROBITUSSIN Take 5 mLs (100 mg total) by mouth every 4 (four) hours as needed for cough or to loosen phlegm.   Gvoke HypoPen 2-Pack 1 MG/0.2ML Soaj Generic drug: Glucagon Inject 1 Act into the skin daily as needed. What changed:   how much to take  when to take this  reasons to take this   hydrocortisone cream 1 % Apply 1 application topically 4 (four) times daily as needed for itching.   Insulin Pen Needle 32G X 6 MM Misc 1 Act by Does not apply route daily.   levETIRAcetam 500 MG tablet Commonly known as:  KEPPRA Take 500 mg by mouth 2 (two) times daily. What changed: Another medication with the same name was removed. Continue taking this medication, and follow the directions you see here.   loratadine 10 MG tablet Commonly known as: CLARITIN Take 1 tablet (10 mg total) by mouth every evening.   pantoprazole 40 MG tablet Commonly known as: PROTONIX TAKE 1 TABLET BY MOUTH  DAILY   PRESCRIPTION MEDICATION CPAP- At bedtime   senna-docusate 8.6-50  MG tablet Commonly known as: Senokot-S Take 1 tablet by mouth at bedtime as needed for mild constipation.   solifenacin 10 MG tablet Commonly known as: VESICARE Take 1 tablet (10 mg total) by mouth daily.   ticagrelor 90 MG Tabs tablet Commonly known as: BRILINTA Take 0.5 tablets (45 mg total) by mouth 2 (two) times daily.   Toujeo Max SoloStar 300 UNIT/ML Solostar Pen Generic drug: insulin glargine (2 Unit Dial) Inject 10 Units into the skin daily.         Contact information for after-discharge care    Destination    [facility] Preferred SNF .   Service: Skilled Nursing Contact information: [address] [place] [phone]                  TOTAL DISCHARGE TIME: 31 minutes  [place]  [facility] Pager on [email or web address]  11/23/2020, 9:33 AM

## 2020-11-23 NOTE — TOC Progression Note (Signed)
Transition of Care [facility]) - Progression Note    Patient Details  Name: Frank [name] MRN: [id number] Date of Birth: 05/20/1941  Transition of Care [facility]) CM/SW [place], [place] Phone Number: 11/23/2020, 2:03 PM  Clinical Narrative:     CSW received call from pt's wife requesting [employer]. Bed was confirmed, Covid ordered, and insurance started. Edgar noted that pt will likely be able to DC tomorrow. SW will continue to follow for transition of care.   Expected Discharge Plan: [place] Barriers to Discharge: Continued Medical Work up  Expected Discharge Plan and Services Expected Discharge Plan: [place] Choice: [place] arrangements for the past 2 months: Single Family Home                                       Social Determinants of Health (SDOH) Interventions    Readmission Risk Interventions No flowsheet data found.

## 2020-11-24 LAB — GLUCOSE, CAPILLARY
Glucose-Capillary: 172 mg/dL — ABNORMAL HIGH (ref 70–99)
Glucose-Capillary: 181 mg/dL — ABNORMAL HIGH (ref 70–99)
Glucose-Capillary: 178 mg/dL — ABNORMAL HIGH (ref 70–99)
Glucose-Capillary: 213 mg/dL — ABNORMAL HIGH (ref 70–99)

## 2020-11-24 LAB — CBC
HCT: 33.4 % — ABNORMAL LOW (ref 39.0–52.0)
Hemoglobin: 10.5 g/dL — ABNORMAL LOW (ref 13.0–17.0)
MCH: 26.3 pg (ref 26.0–34.0)
MCHC: 31.4 g/dL (ref 30.0–36.0)
MCV: 83.5 fL (ref 80.0–100.0)
Platelets: 192 10*3/uL (ref 150–400)
RBC: 4 MIL/uL — ABNORMAL LOW (ref 4.22–5.81)
RDW: 13.3 % (ref 11.5–15.5)
WBC: 6.5 10*3/uL (ref 4.0–10.5)
nRBC: 0 % (ref 0.0–0.2)

## 2020-11-24 LAB — BASIC METABOLIC PANEL
Anion gap: 6 (ref 5–15)
BUN: 25 mg/dL — ABNORMAL HIGH (ref 8–23)
CO2: 30 mmol/L (ref 22–32)
Calcium: 8.8 mg/dL — ABNORMAL LOW (ref 8.9–10.3)
Chloride: 99 mmol/L (ref 98–111)
Creatinine, Ser: 1.57 mg/dL — ABNORMAL HIGH (ref 0.61–1.24)
GFR, Estimated: 44 mL/min — ABNORMAL LOW (ref >60)
Glucose, Bld: 164 mg/dL — ABNORMAL HIGH (ref 70–99)
Potassium: 4.1 mmol/L (ref 3.5–5.1)
Sodium: 135 mmol/L (ref 135–145)

## 2020-11-24 NOTE — Progress Notes (Signed)
Pt has home CPAP at bedside. I asked pt was he ready to go on CPAP and pt stated "no, I seem to be breathing ok without it".

## 2020-11-24 NOTE — Progress Notes (Addendum)
Yesterday evening's events noted.  Patient was noted to be quite somnolent all during the day yesterday.  ABG was done which did not show any hypercapnia.  He is not on any new sedative medications.  The only new medicine that he received was his COVID-19 booster vaccination on Friday.  Patient noted to be afebrile.  Vital signs stable.  Patient seen this morning.  Easily arousable.  Answers all questions appropriately.  No concern for any new neurological process based on examination.  He is nonfocal.  Lungs are clear to auscultation.  S1-S2 is normal regular.  Abdomen is soft nontender nondistended.  Blood work was done this morning which also showed stable findings.  WBC is normal.  Continue to watch the patient in the hospital for another day.  May consider imaging studies if there are any new changes but patient appears to be quite stable at this time except for excessive somnolence.  Medication list was again reviewed.  No changes will be made at this time.  Will reevaluate patient later today.  Patient has been waiting to go to skilled nursing facility.  We will hold off on discharging today.  If he remains stable then he can be released tomorrow.  ADDENDUM Patient was re-evaluated at 1:25pm. His wife is at bedside. He is wide awake and feels well. No further work up anticipated as he is back to baseline.   Bonnielee Haff 11/24/2020

## 2020-11-25 DIAGNOSIS — G9389 Other specified disorders of brain: Secondary | ICD-10-CM | POA: Diagnosis not present

## 2020-11-25 DIAGNOSIS — Z87891 Personal history of nicotine dependence: Secondary | ICD-10-CM | POA: Diagnosis not present

## 2020-11-25 DIAGNOSIS — J40 Bronchitis, not specified as acute or chronic: Secondary | ICD-10-CM | POA: Diagnosis not present

## 2020-11-25 DIAGNOSIS — N1831 Chronic kidney disease, stage 3a: Secondary | ICD-10-CM | POA: Diagnosis not present

## 2020-11-25 DIAGNOSIS — I69821 Dysphasia following other cerebrovascular disease: Secondary | ICD-10-CM | POA: Diagnosis not present

## 2020-11-25 DIAGNOSIS — J418 Mixed simple and mucopurulent chronic bronchitis: Secondary | ICD-10-CM | POA: Diagnosis not present

## 2020-11-25 DIAGNOSIS — I739 Peripheral vascular disease, unspecified: Secondary | ICD-10-CM | POA: Diagnosis not present

## 2020-11-25 DIAGNOSIS — I69391 Dysphagia following cerebral infarction: Secondary | ICD-10-CM | POA: Diagnosis not present

## 2020-11-25 DIAGNOSIS — E785 Hyperlipidemia, unspecified: Secondary | ICD-10-CM | POA: Diagnosis not present

## 2020-11-25 DIAGNOSIS — I10 Essential (primary) hypertension: Secondary | ICD-10-CM | POA: Diagnosis not present

## 2020-11-25 DIAGNOSIS — Z7982 Long term (current) use of aspirin: Secondary | ICD-10-CM | POA: Diagnosis not present

## 2020-11-25 DIAGNOSIS — I6601 Occlusion and stenosis of right middle cerebral artery: Secondary | ICD-10-CM | POA: Diagnosis not present

## 2020-11-25 DIAGNOSIS — N183 Chronic kidney disease, stage 3 unspecified: Secondary | ICD-10-CM | POA: Diagnosis not present

## 2020-11-25 DIAGNOSIS — J90 Pleural effusion, not elsewhere classified: Secondary | ICD-10-CM | POA: Diagnosis not present

## 2020-11-25 DIAGNOSIS — J410 Simple chronic bronchitis: Secondary | ICD-10-CM | POA: Diagnosis not present

## 2020-11-25 DIAGNOSIS — J309 Allergic rhinitis, unspecified: Secondary | ICD-10-CM | POA: Diagnosis not present

## 2020-11-25 DIAGNOSIS — E1129 Type 2 diabetes mellitus with other diabetic kidney complication: Secondary | ICD-10-CM | POA: Diagnosis not present

## 2020-11-25 DIAGNOSIS — J9811 Atelectasis: Secondary | ICD-10-CM | POA: Diagnosis not present

## 2020-11-25 DIAGNOSIS — E1142 Type 2 diabetes mellitus with diabetic polyneuropathy: Secondary | ICD-10-CM | POA: Diagnosis not present

## 2020-11-25 DIAGNOSIS — G4733 Obstructive sleep apnea (adult) (pediatric): Secondary | ICD-10-CM | POA: Diagnosis not present

## 2020-11-25 DIAGNOSIS — S0003XA Contusion of scalp, initial encounter: Secondary | ICD-10-CM | POA: Diagnosis not present

## 2020-11-25 DIAGNOSIS — W19XXXA Unspecified fall, initial encounter: Secondary | ICD-10-CM | POA: Diagnosis not present

## 2020-11-25 DIAGNOSIS — K219 Gastro-esophageal reflux disease without esophagitis: Secondary | ICD-10-CM | POA: Diagnosis not present

## 2020-11-25 DIAGNOSIS — Z794 Long term (current) use of insulin: Secondary | ICD-10-CM | POA: Diagnosis not present

## 2020-11-25 DIAGNOSIS — N390 Urinary tract infection, site not specified: Secondary | ICD-10-CM | POA: Diagnosis not present

## 2020-11-25 DIAGNOSIS — Z7401 Bed confinement status: Secondary | ICD-10-CM | POA: Diagnosis not present

## 2020-11-25 DIAGNOSIS — R059 Cough, unspecified: Secondary | ICD-10-CM | POA: Diagnosis not present

## 2020-11-25 DIAGNOSIS — Z79899 Other long term (current) drug therapy: Secondary | ICD-10-CM | POA: Diagnosis not present

## 2020-11-25 DIAGNOSIS — R0902 Hypoxemia: Secondary | ICD-10-CM | POA: Diagnosis not present

## 2020-11-25 DIAGNOSIS — S065X0A Traumatic subdural hemorrhage without loss of consciousness, initial encounter: Secondary | ICD-10-CM | POA: Diagnosis not present

## 2020-11-25 DIAGNOSIS — E559 Vitamin D deficiency, unspecified: Secondary | ICD-10-CM | POA: Diagnosis not present

## 2020-11-25 DIAGNOSIS — Z7984 Long term (current) use of oral hypoglycemic drugs: Secondary | ICD-10-CM | POA: Diagnosis not present

## 2020-11-25 DIAGNOSIS — F0390 Unspecified dementia without behavioral disturbance: Secondary | ICD-10-CM | POA: Diagnosis not present

## 2020-11-25 DIAGNOSIS — Y92129 Unspecified place in nursing home as the place of occurrence of the external cause: Secondary | ICD-10-CM | POA: Diagnosis not present

## 2020-11-25 DIAGNOSIS — W06XXXA Fall from bed, initial encounter: Secondary | ICD-10-CM | POA: Diagnosis not present

## 2020-11-25 DIAGNOSIS — I639 Cerebral infarction, unspecified: Secondary | ICD-10-CM | POA: Diagnosis not present

## 2020-11-25 DIAGNOSIS — D519 Vitamin B12 deficiency anemia, unspecified: Secondary | ICD-10-CM | POA: Diagnosis not present

## 2020-11-25 DIAGNOSIS — S0990XA Unspecified injury of head, initial encounter: Secondary | ICD-10-CM | POA: Diagnosis not present

## 2020-11-25 DIAGNOSIS — I69828 Other speech and language deficits following other cerebrovascular disease: Secondary | ICD-10-CM | POA: Diagnosis not present

## 2020-11-25 DIAGNOSIS — Z515 Encounter for palliative care: Secondary | ICD-10-CM | POA: Diagnosis not present

## 2020-11-25 DIAGNOSIS — R531 Weakness: Secondary | ICD-10-CM | POA: Diagnosis not present

## 2020-11-25 DIAGNOSIS — E1140 Type 2 diabetes mellitus with diabetic neuropathy, unspecified: Secondary | ICD-10-CM | POA: Diagnosis not present

## 2020-11-25 DIAGNOSIS — N3281 Overactive bladder: Secondary | ICD-10-CM | POA: Diagnosis not present

## 2020-11-25 DIAGNOSIS — M2550 Pain in unspecified joint: Secondary | ICD-10-CM | POA: Diagnosis not present

## 2020-11-25 DIAGNOSIS — R5383 Other fatigue: Secondary | ICD-10-CM | POA: Diagnosis not present

## 2020-11-25 DIAGNOSIS — J3489 Other specified disorders of nose and nasal sinuses: Secondary | ICD-10-CM | POA: Diagnosis not present

## 2020-11-25 DIAGNOSIS — E1165 Type 2 diabetes mellitus with hyperglycemia: Secondary | ICD-10-CM | POA: Diagnosis not present

## 2020-11-25 DIAGNOSIS — R6889 Other general symptoms and signs: Secondary | ICD-10-CM | POA: Diagnosis not present

## 2020-11-25 DIAGNOSIS — R5381 Other malaise: Secondary | ICD-10-CM | POA: Diagnosis not present

## 2020-11-25 DIAGNOSIS — F32A Depression, unspecified: Secondary | ICD-10-CM | POA: Diagnosis not present

## 2020-11-25 DIAGNOSIS — M6281 Muscle weakness (generalized): Secondary | ICD-10-CM | POA: Diagnosis not present

## 2020-11-25 DIAGNOSIS — Z743 Need for continuous supervision: Secondary | ICD-10-CM | POA: Diagnosis not present

## 2020-11-25 DIAGNOSIS — G459 Transient cerebral ischemic attack, unspecified: Secondary | ICD-10-CM | POA: Diagnosis not present

## 2020-11-25 DIAGNOSIS — D631 Anemia in chronic kidney disease: Secondary | ICD-10-CM | POA: Diagnosis not present

## 2020-11-25 DIAGNOSIS — J440 Chronic obstructive pulmonary disease with acute lower respiratory infection: Secondary | ICD-10-CM | POA: Diagnosis not present

## 2020-11-25 DIAGNOSIS — Z9981 Dependence on supplemental oxygen: Secondary | ICD-10-CM | POA: Diagnosis not present

## 2020-11-25 DIAGNOSIS — J209 Acute bronchitis, unspecified: Secondary | ICD-10-CM | POA: Diagnosis not present

## 2020-11-25 DIAGNOSIS — G9341 Metabolic encephalopathy: Secondary | ICD-10-CM | POA: Diagnosis not present

## 2020-11-25 DIAGNOSIS — H47019 Ischemic optic neuropathy, unspecified eye: Secondary | ICD-10-CM | POA: Diagnosis not present

## 2020-11-25 DIAGNOSIS — R0989 Other specified symptoms and signs involving the circulatory and respiratory systems: Secondary | ICD-10-CM | POA: Diagnosis not present

## 2020-11-25 DIAGNOSIS — I129 Hypertensive chronic kidney disease with stage 1 through stage 4 chronic kidney disease, or unspecified chronic kidney disease: Secondary | ICD-10-CM | POA: Diagnosis not present

## 2020-11-25 DIAGNOSIS — I441 Atrioventricular block, second degree: Secondary | ICD-10-CM | POA: Diagnosis not present

## 2020-11-25 DIAGNOSIS — Z950 Presence of cardiac pacemaker: Secondary | ICD-10-CM | POA: Diagnosis not present

## 2020-11-25 DIAGNOSIS — R1312 Dysphagia, oropharyngeal phase: Secondary | ICD-10-CM | POA: Diagnosis not present

## 2020-11-25 LAB — GLUCOSE, CAPILLARY
Glucose-Capillary: 187 mg/dL — ABNORMAL HIGH (ref 70–99)
Glucose-Capillary: 177 mg/dL — ABNORMAL HIGH (ref 70–99)

## 2020-11-25 NOTE — Progress Notes (Signed)
Physical Therapy Treatment Patient Details Name: Frank [name] MRN: [id number] DOB: Nov 15, 1940 Today's Date: 11/25/2020    History of Present Illness 80yo male admitted 11/18/20 presenting with c/o L LE weakness as well as multiple falls at home, confusion. Per neurology w/u, concern for TIA vs recrudescence of past CVA. MRI negative for acute changes. 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  Emphasis on transitions, sit to stand x4 with pre gait activity, transfers and stepping.   Follow Up Recommendations  SNF;Supervision/Assistance - 24 hour     Equipment Recommendations  None recommended by PT    Recommendations for Other Services       Precautions / Restrictions Precautions Precautions: Fall;Other (comment) Precaution Comments: recent R MCA CVA/L weakness, posterior LOB, high fall risk    Mobility  Bed Mobility Overal bed mobility: Needs Assistance Bed Mobility: Supine to Sit     Supine to sit: Min assist     General bed mobility comments: up via R elbow with truncal assist and cues for scooting using bil UE's and assist of w/shift and scoot assist    Transfers Overall transfer level: Needs assistance Equipment used: Rolling walker (2 wheeled) Transfers: Sit to/from [facility] Sit to Stand: Mod assist Stand pivot transfers: Min assist;Mod assist       General transfer comment: pt with moderate posterior bias, use of LE's to support against the bed frame.  Assisted pt coming forward and mild boost.  cues for hand placement ascent and descent  Ambulation/Gait Ambulation/Gait assistance: Min assist;Mod assist Gait Distance (Feet): 2 Feet (forward and back x2)     Gait velocity: decreased   General Gait Details: very small steps retropulsive in nature with decreased w/shifting   Stairs             Wheelchair Mobility    Modified Rankin (Stroke Patients Only)        Balance Overall balance assessment: History of Falls Sitting-balance support: Feet supported;No upper extremity supported Sitting balance-Leahy Scale: Good Sitting balance - Comments: posterior bias   Standing balance support: Bilateral upper extremity supported;During functional activity;Single extremity supported Standing balance-Leahy Scale: Poor Standing balance comment: posterior lean in standing with B LEs braced against bed.  Worked over a 6-7 min period in pre gait activity and work to shift weight forward.                            Cognition Arousal/Alertness: Awake/alert Behavior During Therapy: WFL for tasks assessed/performed Overall Cognitive Status: Within Functional Limits for tasks assessed (ST memory deficits)                                        Exercises      General Comments General comments (skin integrity, edema, etc.): vss on RA, but general drop in SpO2 toward lower 90's      Pertinent Vitals/Pain Pain Assessment: Faces Faces Pain Scale: No hurt Pain Intervention(s): Monitored during session    Home Living Family/patient expects to be discharged to:: Skilled nursing facility Living Arrangements: Spouse/significant other Available Help at Discharge: Available 24 hours/day;Family;Personal care attendant                Prior Function  PT Goals (current goals can now be found in the care plan section) Acute Rehab PT Goals PT Goal Formulation: With patient/family Time For Goal Achievement: 12/02/20 Potential to Achieve Goals: Fair Progress towards PT goals: Progressing toward goals    Frequency    Min 3X/week      PT Plan Discharge plan needs to be updated    Co-evaluation              AM-PAC PT "6 Clicks" Mobility   Outcome Measure  Help needed turning from your back to your side while in a flat bed without using bedrails?: A Little Help needed moving from lying on your back  to sitting on the side of a flat bed without using bedrails?: A Little Help needed moving to and from a bed to a chair (including a wheelchair)?: A Lot Help needed standing up from a chair using your arms (e.g., wheelchair or bedside chair)?: A Lot Help needed to walk in hospital room?: A Lot Help needed climbing 3-5 steps with a railing? : Total 6 Click Score: 13    End of Session   Activity Tolerance: Patient tolerated treatment well Patient left: in chair;with call bell/phone within reach;with family/visitor present Nurse Communication: Mobility status PT Visit Diagnosis: Unsteadiness on feet (R26.81);Difficulty in walking, not elsewhere classified (R26.2);Repeated falls (R29.6);Muscle weakness (generalized) (M62.81)     Time: 2536-6440 PT Time Calculation (min) (ACUTE ONLY): 24 min  Charges:  $Therapeutic Activity: 8-22 mins $Neuromuscular Re-education: 8-22 mins                     11/25/2020  Ginger Carne., PT Acute Rehabilitation Services [phone]  (pager) [phone]  (office)   Tessie Fass Arafat Cocuzza 11/25/2020, 1:10 PM

## 2020-11-25 NOTE — TOC Progression Note (Signed)
Transition of Care [facility]) - Progression Note    Patient Details  Name: Frank [name] MRN: [id number] Date of Birth: 09/09/1940  Transition of Care [facility]) CM/SW [place], [place] Phone Number: 11/25/2020, 9:53 AM  Clinical Narrative:     Insurance approved SNF 5/16- 5/18 Auth ID [id number] Ref ID [id number] Arist Dagout Fax # [phone]  Expected Discharge Plan: Skilled Nursing Facility Barriers to Discharge: Continued Medical Work up  Expected Discharge Plan and Services Expected Discharge Plan: [place] Choice: [place] arrangements for the past 2 months: Single Family Home Expected Discharge Date: 11/25/20                                     Social Determinants of Health (SDOH) Interventions    Readmission Risk Interventions No flowsheet data found.

## 2020-11-25 NOTE — Progress Notes (Signed)
Report called and given to [facility] LPN of [employer]

## 2020-11-25 NOTE — TOC Progression Note (Signed)
Transition of Care [facility]) - Progression Note    Patient Details  Name: Frank [name] MRN: [id number] Date of Birth: 16-Mar-1941  Transition of Care [facility]) CM/SW [place], [place] Phone Number: 11/25/2020, 11:59 AM  Clinical Narrative:     Patient will DC to: [facility] Anticipated DC date: 11/25/20 Family notified: Asher, Torpey (Spouse)  [phone] (Mobile) Transport by: Corey Harold   Per MD patient ready for DC to [employer]. RN, patient, patient's family, and facility notified of DC. Discharge Summary and FL2 sent to facility. RN to call report prior to discharge ([phone] room 128B). DC packet on chart. Ambulance transport requested for patient.   CSW will sign off for now as social work intervention is no longer needed. Please consult [place] again if new needs arise.   Expected Discharge Plan: [place] Barriers to Discharge: No Barriers Identified  Expected Discharge Plan and Services Expected Discharge Plan: [place] Choice: [place] arrangements for the past 2 months: Single Family Home Expected Discharge Date: 11/25/20                                     Social Determinants of Health (SDOH) Interventions    Readmission Risk Interventions No flowsheet data found.

## 2020-11-25 NOTE — Discharge Summary (Signed)
[facility]  Physician Discharge Summary   Patient ID: Frank [name] MRN: [id number] DOB/AGE: [birth year] 83 y.o.  Admit date: 11/18/2020 Discharge date: 11/25/2020  PCP: Janith Lima, MD  DISCHARGE DIAGNOSES:  Left leg weakness likely due to recrudescence of prior stroke due to UTI Acute urinary tract infection with E. 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  He had been discharged on DAPT (aspirin and Brilinta), however he ran out of Brilinta 2 weeks PTA and unable to refill.  In the ED, code stroke was called.  Neurology evaluated, CTH, CTA with no new stroke, no LVO, suspecting TIA versus recrudescence due to infectious/metabolic abnormalities and less likely due to  seizures.  Consultations:  Neurology  Procedures:  EEG  HOSPITAL COURSE:   Left leg weakness likely due to recrudescence of prior stroke due to UTI/stroke was ruled out. History as noted above.  Code stroke was activated in ED.  CT head and CTA head without new stroke and no large vessel occlusion but showed prior R MCA stroke.  MRI brain shows no acute intracranial abnormality, chronic right MCA territory infarct and chronic bilateral subdural hematomas, unchanged from 3/22.   EEG was done which did not show any acute epileptiform activity. Per neurology the presentation is likely due to TIA (primary diagnosis) versus recrudescence of prior strokes versus seizures (less likely).   TTE recently on 09/09/2020 with preserved LVEF and grade 1 diastolic dysfunction.  LDL 53.  A1c 7.7. TSH: 0.459.   Continuing aspirin 81 Mg daily and Brilinta 45 Mg twice daily.  Continue atorvastatin 10 Mg daily.  PT recommends SNF.   ST recommends dysphagia 2 and nectar thickened liquids.   Patient has significantly improved though he remains physically deconditioned and would benefit from short-term rehab.  Acute metabolic encephalopathy Likely related to acute cystitis complicating possible underlying vascular dementia and CKD.  No acute stroke on imaging.  UDS positive for opiates and recently started codeine cough syrup could certainly have contributed to his presentation and opioids should be avoided. Patient was back to his baseline but then about 24 hours after he got his COVID-19 booster vaccination he became very somnolent.  No new deficits noted.  He was observed.  ABG was done which did not show any hypercapnia.  Patient was back to baseline after another 24 hours.  E. coli acute cystitis without hematuria: Urine microscopy suggestive of UTI and urine culture shows >100 K colonies of E. Coli.    Patient was initially placed on ceftriaxone.  Changed  over to cephalexin.  He has completed course of  antibiotics.  Dysphagia Speech therapy evaluated and recommended dysphagia 2 diet and nectar thickened liquids.    Continue with strict aspiration precautions.  Transient hypoxia Chest x-ray showed bilateral atelectasis.  Concern regarding chronic aspiration.  Diet per speech therapy as noted above.  No clinical or radiological pneumonia at this time.    CKD stage IIIa Serum creatinine was in the 1.3-1.4 range in March.    Creatinine has been fluctuating.  Likely has experienced some progression of CKD.  Stable for the most part.  Type II DM with renal complications Continue Lantus, SSI and adjust insulins as needed.  OSA on nightly CPAP.  History of TBI Keppra resumed in March.  Essential hypertension Continue current medications.  Anemia in CKD Stable.  Chronic cough secondary to postnasal drip. Continue antitussive agents as well as Flonase.  Goals of care Patient's spouse discussed with [facility] team and requesting palliative care consult.  Seen by palliative care.  May benefit from being followed in the outpatient setting.  Obesity Estimated body mass index is 30.27 kg/m as calculated from the following:   Height as of this encounter: 5\' 9"  (1.753 m).   Weight as of this encounter: 93 kg.  Patient is stable.  Okay for discharge to SNF when bed is available.   PERTINENT LABS:  The results of significant diagnostics from this hospitalization (including imaging, microbiology, ancillary and laboratory) are listed below for reference.    Microbiology: Recent Results (from the past 240 hour(s))  Resp Panel by RT-PCR (Flu A&B, Covid) Nasopharyngeal Swab     Status: None   Collection Time: 11/18/20  9:54 AM   Specimen: Nasopharyngeal Swab; Nasopharyngeal(NP) swabs in vial transport medium  Result Value Ref Range Status   SARS Coronavirus 2 by RT PCR NEGATIVE NEGATIVE Final    Comment: (NOTE) SARS-CoV-2 target nucleic acids are NOT DETECTED.  The SARS-CoV-2 RNA  is generally detectable in upper respiratory specimens during the acute phase of infection. The lowest concentration of SARS-CoV-2 viral copies this assay can detect is 138 copies/mL. A negative result does not preclude SARS-Cov-2 infection and should not be used as the sole basis for treatment or other patient management decisions. A negative result may occur with  improper specimen collection/handling, submission of specimen other than nasopharyngeal swab, presence of viral mutation(s) within the areas targeted by this assay, and inadequate number of viral copies(<138 copies/mL). A negative result must be combined with clinical observations, patient history, and epidemiological information. The expected result is Negative.  Fact Sheet for Patients:  EntrepreneurPulse.com.au  Fact Sheet for Healthcare Providers:  IncredibleEmployment.be  This test is no t yet approved or cleared by the [place] FDA and  has been authorized for detection and/or diagnosis of SARS-CoV-2 by FDA under an Emergency Use Authorization (EUA). This EUA will remain  in effect (meaning this test can be used) for the duration of the COVID-19 declaration under Section 564(b)(1) of the Act, 21 U.S.C.section 360bbb-3(b)(1), unless the authorization is terminated  or revoked sooner.       Influenza A by PCR NEGATIVE NEGATIVE Final   Influenza B by PCR NEGATIVE NEGATIVE Final    Comment: (NOTE) The Xpert Xpress SARS-CoV-2/FLU/RSV plus assay is intended as an aid in the diagnosis of influenza from Nasopharyngeal swab specimens and should not be used as a sole basis for treatment. Nasal washings and aspirates are unacceptable for Xpert Xpress SARS-CoV-2/FLU/RSV testing.  Fact Sheet for Patients:  BloggerCourse.comhttps://www.fda.gov/media/152166/download  Fact Sheet for Healthcare Providers: SeriousBroker.ithttps://www.fda.gov/media/152162/download  This test is not yet approved or cleared by the  [place] FDA and has been authorized for detection and/or diagnosis of SARS-CoV-2 by FDA under an Emergency Use Authorization (EUA). This EUA will remain in effect (meaning this test can be used) for the duration of the COVID-19 declaration under Section 564(b)(1) of the Act, 21 U.S.C. section 360bbb-3(b)(1), unless the authorization is terminated or revoked.  Performed at [facility], [address] [place], [place] [address]   Culture, Urine     Status: Abnormal   Collection Time: 11/18/20 11:56 AM   Specimen: Urine, Catheterized  Result Value Ref Range Status   Specimen Description URINE, CATHETERIZED  Final   Special Requests   Final    NONE Performed at [facility], [address] [place], [place] [address]    Culture >=100,000 COLONIES/mL ESCHERICHIA COLI (A)  Final   Report Status 11/20/2020 FINAL  Final   Organism ID, Bacteria ESCHERICHIA COLI (A)  Final      Susceptibility   Escherichia coli - MIC*    AMPICILLIN >=32 RESISTANT Resistant     CEFAZOLIN <=4 SENSITIVE Sensitive     CEFEPIME <=0.12 SENSITIVE Sensitive     CEFTRIAXONE <=0.25 SENSITIVE Sensitive     CIPROFLOXACIN <=0.25 SENSITIVE Sensitive     GENTAMICIN <=1 SENSITIVE Sensitive     IMIPENEM <=0.25 SENSITIVE Sensitive     NITROFURANTOIN <=16 SENSITIVE Sensitive     TRIMETH/SULFA >=320 RESISTANT Resistant     AMPICILLIN/SULBACTAM 16 INTERMEDIATE Intermediate     PIP/TAZO <=4 SENSITIVE Sensitive     * >=100,000 COLONIES/mL ESCHERICHIA COLI  SARS CORONAVIRUS 2 (TAT 6-24 HRS) Nasopharyngeal Nasopharyngeal Swab     Status: None   Collection Time: 11/23/20  9:38 AM   Specimen: Nasopharyngeal Swab  Result Value Ref Range Status   SARS Coronavirus 2 NEGATIVE NEGATIVE Final    Comment: (NOTE) SARS-CoV-2 target nucleic acids are NOT DETECTED.  The SARS-CoV-2 RNA is generally detectable in upper and lower respiratory specimens during the acute phase of infection. Negative results do not  preclude SARS-CoV-2 infection, do not rule out co-infections with other pathogens, and should not be used as the sole basis for treatment or other patient management decisions. Negative results must be combined with clinical observations, patient history, and epidemiological information. The expected result is Negative.  Fact Sheet for Patients: HairSlick.nohttps://www.fda.gov/media/138098/download  Fact Sheet for Healthcare Providers: quierodirigir.comhttps://www.fda.gov/media/138095/download  This test is not yet approved or cleared by the [place] FDA and  has been authorized for detection and/or diagnosis of SARS-CoV-2 by FDA under an Emergency Use Authorization (EUA). This EUA will remain  in effect (meaning this test can be used) for the duration of the COVID-19 declaration under Se ction 564(b)(1) of the Act, 21 U.S.C. section 360bbb-3(b)(1), unless the authorization is terminated or revoked sooner.  Performed at [facility], [address] [place], [place] [address]      Labs:  COVID-19 Labs   Lab Results  Component Value Date   SARSCOV2NAA NEGATIVE 11/23/2020   SARSCOV2NAA NEGATIVE 11/18/2020   SARSCOV2NAA NEGATIVE 11/06/2020   SARSCOV2NAA NEGATIVE 09/17/2020      Basic Metabolic Panel: Recent Labs  Lab 11/18/20 1002 11/19/20 0324 11/20/20 0257 11/21/20 0256 11/24/20 0815  NA 135 137 139 137 135  K 4.1 4.0 3.7 3.7 4.1  CL 100 100 106 101 99  CO2  --  28 27 27  30  GLUCOSE 213* 212* 128* 128* 164*  BUN 31* 30* 24* 24* 25*  CREATININE 1.60* 1.55* 1.38* 1.54* 1.57*  CALCIUM  --  9.2 8.8* 8.9 8.8*   CBC: Recent Labs  Lab 11/18/20 1002 11/20/20 0257 11/24/20 0815  WBC  --  6.1 6.5  HGB 9.9* 9.9* 10.5*  HCT 29.0* 31.8* 33.4*  MCV  --  84.8 83.5  PLT  --  183 192   BNP: BNP (last 3 results) Recent Labs    05/14/20 1329  BNP 393.4*    CBG: Recent Labs  Lab 11/24/20 0620 11/24/20 1230 11/24/20 1610 11/24/20 2126 11/25/20 0627  GLUCAP 172* 181* 178*  213* 187*     IMAGING STUDIES DG Chest 2 View  Result Date: 11/18/2020 CLINICAL DATA:  Cough EXAM: CHEST - 2 VIEW COMPARISON:  November 05, 2020 FINDINGS: There is atelectatic change in the lung bases. No edema or airspace opacity. Pacemaker present with lead tips in right atrium and right ventricle regions. Heart size and pulmonary vascularity are normal. No adenopathy. There is aortic atherosclerosis. There is calcification in the left carotid artery. There is degenerative change in the thoracic spine. Postoperative change noted in right shoulder. Evidence of prior trauma lateral right clavicle. IMPRESSION: Bibasilar atelectasis. No edema or airspace opacity. Heart size within normal limits. Pacemaker lead positions unchanged. Aortic Atherosclerosis (ICD10-I70.0). Electronically Signed   By: Lowella Grip III M.D.   On: 11/18/2020 13:20   DG Chest 2 View  Result Date: 11/05/2020 CLINICAL DATA:  Cough and fatigue EXAM: CHEST - 2 VIEW COMPARISON:  September 18, 2020 FINDINGS: There is mild bibasilar scarring. No edema or airspace opacity. Heart size and pulmonary vascular normal. 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 No adenopathy. There is aortic atherosclerosis. Postoperative change noted in the proximal left humerus. Evidence of prior trauma lateral right clavicle. IMPRESSION: Slight bibasilar scarring. No edema or airspace opacity. Stable cardiac silhouette. 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 Aortic Atherosclerosis (ICD10-I70.0). Electronically Signed   By: Lowella Grip III M.D.   On: 11/05/2020 12:11   MR BRAIN WO CONTRAST  Result Date: 11/18/2020 CLINICAL DATA:  Altered mental status with increased left lower extremity weakness over baseline. History of right MCA stroke. EXAM: MRI HEAD WITHOUT CONTRAST TECHNIQUE: Multiplanar, multiecho pulse sequences of the brain and surrounding structures were obtained without intravenous contrast. COMPARISON:   Head CT 11/18/2020 and MRI 09/10/2020 FINDINGS: The study is mildly to moderately motion degraded. Brain: There is a moderately large chronic posterior right MCA territory infarct with associated chronic blood products (acute to subacute in 09/2020). No acute infarct is identified. Chronic bilateral subdural hematomas measure up to 8 mm in thickness on the right and 9 mm on the left, unchanged from the prior MRI. There is no midline shift or other significant mass effect. Small T2 hyperintensities in the cerebral white matter bilaterally are unchanged from the prior MRI and are nonspecific but compatible with mild chronic small vessel ischemic disease. There is mild cerebral atrophy. Vascular: Major intracranial vascular flow voids are grossly preserved. Susceptibility artifact in the right sylvian fissure from a right MCA branch vessel stent. Skull and upper cervical spine: Left frontal craniotomy. Right frontal burr hole. Sinuses/Orbits: Bilateral cataract extraction. Mild bilateral ethmoid sinus mucosal thickening. Clear mastoid air cells. Other: None. IMPRESSION: 1. No acute intracranial abnormality. 2. Chronic right MCA territory infarct. 3. Chronic bilateral subdural hematomas, unchanged from 09/2020. 4. Mild chronic small vessel ischemic disease. Electronically  Signed   By: Logan Bores M.D.   On: 11/18/2020 16:18   EEG adult  Result Date: 11/20/2020 Lora Havens, MD     11/20/2020  5:37 PM Patient Name: Jancarlos Thrun MRN: [id number] Epilepsy Attending: Lora Havens Referring Physician/Provider: Dr Bonnielee Haff Date: 11/20/2020 Duration: 24 mins Patient history: 80yo M with prior R MCA stroke, now with  Hallucinations and fall. EEG to evaluate for seizure Level of alertness: Awake, asleep AEDs during EEG study: LEV Technical aspects: This EEG study was done with scalp electrodes positioned according to the 10-20 International system of electrode placement. Electrical activity was acquired  at a sampling rate of 500Hz  and reviewed with a high frequency filter of 70Hz  and a low frequency filter of 1Hz . EEG data were recorded continuously and digitally stored. Description: The posterior dominant rhythm consists of 8 Hz activity of moderate voltage (25-35 uV) seen predominantly in posterior head regions, asymmetric ( R<l) and reactive to eye opening and eye closing. Sleep was characterized by vertex waves, sleep spindles (12 to 14 Hz), maximal frontocentral region.  EEG showed continuous 5 to 8 Hz theta-alpha activity in left hemisphere admixed with 15 to 18 Hz beta activity in left central temporal region consistent with breach artifact.  There is also 3 to 5 Hz theta-delta slowing in right hemisphere. Sharp transients were seen in left centro-parietal region region.  Hyperventilation and photic stimulation were not performed.   ABNORMALITY - Continuous slow, generalized and lateralized right hemisphere - Breach artifact, left centro- temporal region  IMPRESSION: This study is suggestive of cortical dysfunction in right hemisphere likely secondary to underlying encephalomalacia.  There is also evidence of breach artifact in left centro- temporal region consistent with prior craniotomy.  Additionally there is evidence of mild diffuse encephalopathy, nonspecific etiology.  No seizures or definite epileptiform discharges were seen throughout the recording.  Lora Havens   CUP PACEART REMOTE DEVICE CHECK  Result Date: 11/14/2020 Scheduled remote reviewed. Normal device function.  Next remote 91 days. Kathy Breach, RN, CCDS, [employer]  CT HEAD CODE STROKE WO CONTRAST  Result Date: 11/18/2020 CLINICAL DATA:  Code stroke. 80 year old male last known well at midnight. Right MCA infarct with hemorrhagic transformation in March. Right MCA M2 stent. History of unresolved subdural hematomas. EXAM: CT HEAD WITHOUT CONTRAST TECHNIQUE: Contiguous axial images were obtained from the base of the  skull through the vertex without intravenous contrast. COMPARISON:  Head CT 09/18/2020 and earlier. FINDINGS: Brain: Mixed density bilateral subdural hematomas are stable since March, measuring up to 8-9 mm in thickness bilaterally and most pronounced over the posterior convexities. Developing encephalomalacia throughout the posterior right MCA territory with resolved hyperdense hemorrhage and mass effect since March. Hypodensity in the posterior left corona radiata, posterior left internal capsule and medial left thalamus appears increased since March. Gray-white matter differentiation elsewhere appears stable. No acute cortically based infarct identified. Vascular: Mild Calcified atherosclerosis at the skull base. Right MCA branch stent in the sylvian fissure appears stable. Skull: Previous left superior convexity craniotomy, bilateral burr holes. No acute osseous abnormality identified. Sinuses/Orbits: Mildly improved ethmoid sinus mucosal thickening. Other Visualized paranasal sinuses and mastoids are stable and well aerated. Other: No acute orbit or scalp soft tissue findings. ASPECTS [facility] Stroke Program Early CT Score) Total score (0-10 with 10 being normal): 10 IMPRESSION: 1. No acute cortically based infarct or acute intracranial hemorrhage identified. ASPECTS 10. 2. Developing encephalomalacia in the posterior right hemisphere and resolved hemorrhagic  transformation there since March. 3. Questionable increased small vessel disease in the posterior limb left internal capsule and left thalamus since March. 4. Unresolved small bilateral mixed density subdural hematomas remain stable. Study discussed by telephone with Dr. Donnetta Simpers on 11/18/2020 at 09:28 . Electronically Signed   By: Genevie Ann M.D.   On: 11/18/2020 09:30   CT ANGIO HEAD CODE STROKE  Result Date: 11/18/2020 CLINICAL DATA:  Code stroke. 80 year old male last known well at midnight. Right MCA infarct with hemorrhagic transformation in  March. Right MCA M2 stent. History of unresolved subdural hematomas. EXAM: CT ANGIOGRAPHY HEAD AND NECK TECHNIQUE: Multidetector CT imaging of the head and neck was performed using the standard protocol during bolus administration of intravenous contrast. Multiplanar CT image reconstructions and MIPs were obtained to evaluate the vascular anatomy. Carotid stenosis measurements (when applicable) are obtained utilizing NASCET criteria, using the distal internal carotid diameter as the denominator. CONTRAST:  80mL OMNIPAQUE IOHEXOL 350 MG/ML SOLN COMPARISON:  Plain head CT 0919 hours today. CTA head and neck and CTP 09/09/20. FINDINGS: CTA NECK Skeleton: Stable.  No acute osseous abnormality identified. Upper chest: Left chest cardiac pacemaker redemonstrated. Patchy nonspecific upper lung opacity has not significantly changed since February and may be scarring. No superior mediastinal lymphadenopathy. Visible central pulmonary arteries appear patent. Other neck: Stable.  No acute finding. Aortic arch: Stable mild calcified arch atherosclerosis. Three vessel arch configuration. Right carotid system: Stable right CCA plaque without significant stenosis proximal to the bifurcation. Bulky calcified plaque at the right ICA origin and bulb resulting in radiographic string sign stenosis appears stable on series 5, image 104. The right ICA remains patent to the skull base. Left carotid system: Stable left CCA, left carotid bifurcation, proximal left ICA, and mild distal left cervical ICA calcified plaque with less than 50% stenosis. Vertebral arteries: Stable, and notable for mild to moderate right vertebral artery origin stenosis due to calcified plaque. CTA HEAD Posterior circulation: Stable and negative. Mildly dominant left V4 segment. Both posterior communicating arteries are small but present. Anterior circulation: Both ICA siphons remain patent. Calcified siphon atherosclerosis is stable, mild to moderate and not  associated with significant siphon stenosis. Normal posterior communicating artery and ophthalmic artery origins. Patent carotid termini, MCA and ACA origins. Dominant left A1 as before. 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 Left MCA M1 and left MCA branches are stable and within normal limits. Right MCA M1 and right MCA bifurcation remain patent without stenosis. Posterior right M2 stent has been placed traversing the embolized calcification demonstrated in February. The stent appears patent and enhancing (series 15, image 10). Although posterior right MCA division branches distal to the stent remain diminutive or absent. Other right MCA branches appear stable. Venous sinuses: Patent. Anatomic variants: Dominant left ACA A1. Review of the MIP images confirms the above findings IMPRESSION: 1. Negative for emergent large vessel occlusion. 2. Stented posterior Right MCA branch where the embolized calcification was noted in February. The stent is patent. And other MCA branches remain stable. 3. 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 Electronically Signed   By: Genevie Ann M.D.   On: 11/18/2020 10:09   CT ANGIO NECK CODE STROKE  Result Date: 11/18/2020 CLINICAL DATA:  Code stroke. 80 year old male last known  well at midnight. Right MCA infarct with hemorrhagic transformation in March. Right MCA M2 stent. History of unresolved subdural hematomas. EXAM: CT ANGIOGRAPHY HEAD AND NECK TECHNIQUE: Multidetector CT imaging of the head and neck was performed using the standard protocol during bolus administration of intravenous contrast. Multiplanar CT image reconstructions and MIPs were obtained to evaluate the vascular anatomy. Carotid  stenosis measurements (when applicable) are obtained utilizing NASCET criteria, using the distal internal carotid diameter as the denominator. CONTRAST:  68mL OMNIPAQUE IOHEXOL 350 MG/ML SOLN COMPARISON:  Plain head CT 0919 hours today. CTA head and neck and CTP 09/09/20. FINDINGS: CTA NECK Skeleton: Stable.  No acute osseous abnormality identified. Upper chest: Left chest cardiac pacemaker redemonstrated. Patchy nonspecific upper lung opacity has not significantly changed since February and may be scarring. No superior mediastinal lymphadenopathy. Visible central pulmonary arteries appear patent. Other neck: Stable.  No acute finding. Aortic arch: Stable mild calcified arch atherosclerosis. Three vessel arch configuration. Right carotid system: Stable right CCA plaque without significant stenosis proximal to the bifurcation. Bulky calcified plaque at the right ICA origin and bulb resulting in radiographic string sign stenosis appears stable on series 5, image 104. The right ICA remains patent to the skull base. Left carotid system: Stable left CCA, left carotid bifurcation, proximal left ICA, and mild distal left cervical ICA calcified plaque with less than 50% stenosis. Vertebral arteries: Stable, and notable for mild to moderate right vertebral artery origin stenosis due to calcified plaque. CTA HEAD Posterior circulation: Stable and negative. Mildly dominant left V4 segment. Both posterior communicating arteries are small but present. Anterior circulation: Both ICA siphons remain patent. Calcified siphon atherosclerosis is stable, mild to moderate and not associated with significant siphon stenosis. Normal posterior communicating artery and ophthalmic artery origins. Patent carotid termini, MCA and ACA origins. Dominant left A1 as before. 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 Left MCA M1 and left MCA branches are  stable and within normal limits. Right MCA M1 and right MCA bifurcation remain patent without stenosis. Posterior right M2 stent has been placed traversing the embolized calcification demonstrated in February. The stent appears patent and enhancing (series 15, image 10). Although posterior right MCA division branches distal to the stent remain diminutive or absent. Other right MCA branches appear stable. Venous sinuses: Patent. Anatomic variants: Dominant left ACA A1. Review of the MIP images confirms the above findings IMPRESSION: 1. Negative for emergent large vessel occlusion. 2. Stented posterior Right MCA branch where the embolized calcification was noted in February. The stent is patent. And other MCA branches remain stable. 3. 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 Electronically Signed   By: Genevie Ann M.D.   On: 11/18/2020 10:09    DISCHARGE EXAMINATION: Vitals:   11/24/20 1922 11/24/20 2342 11/25/20 0428 11/25/20 0723  BP: 130/65 129/63 134/72 122/60  Pulse:  84 77 74  Resp: (!) 21 (!) 24 (!) 23 (!) 24  Temp: 99.4 F (37.4 C) 98.5 F (36.9 C) 99.1 F (37.3 C) 98.9 F (37.2 C)  TempSrc: Oral Oral Axillary Oral  SpO2:  95%  100%  Weight:      Height:       General appearance: In no distress.  Denies any complaints this morning. Resp: Clear to auscultation bilaterally.  Normal effort Cardio: S1-S2 is normal regular.  No S3-S4.  No rubs murmurs or bruit GI: Abdomen is soft.  Nontender nondistended.  Bowel sounds are present normal.  No masses organomegaly     DISPOSITION: SNF  Discharge Instructions    Call MD for:  difficulty breathing, headache or visual disturbances   Complete by: As directed    Call MD for:  extreme fatigue   Complete by: As directed    Call MD for:  persistant  dizziness or light-headedness   Complete by: As directed    Call MD for:  persistant nausea and vomiting   Complete by: As directed    Call MD for:  severe uncontrolled pain   Complete by: As directed    Call MD for:  temperature >100.4   Complete by: As directed    Diet - low sodium heart healthy   Complete by: As directed    Discharge instructions   Complete by: As directed    Please review instructions on the discharge summary.  You were cared for by a hospitalist during your hospital stay. If you have any questions about your discharge medications or the care you received while you were in the hospital after you are discharged, you can call the unit and asked to speak with the hospitalist on call if the hospitalist that took care of you is not available. Once you are discharged, your primary care physician will handle any further medical issues. Please note that NO REFILLS for any discharge medications will be authorized once you are discharged, as it is imperative that you return to your primary care physician (or establish a relationship with a primary care physician if you do not have one) for your aftercare needs so that they can reassess your need for medications and monitor your lab values. If you do not have a primary care physician, you can call [phone] for a physician referral.   Increase activity slowly   Complete by: As directed         Allergies as of 11/25/2020      Reactions   Lisinopril Cough   Invokana [canagliflozin] Other (See Comments)   Stopped by MD   Penicillins Other (See Comments)   Allergic as a child.  Patient does not remember reaction.  Has had cephalasporins without problems.   Sulfamethoxazole Other (See Comments)   Unknown reaction- from childhood      Medication List    STOP taking these medications   HYDROcodone bit-homatropine 5-1.5 MG/5ML syrup Commonly known as: HYCODAN     TAKE these medications   ACCRUFeR 30 MG Caps Generic drug:  Ferric Maltol Take 1 capsule by mouth in the morning and at bedtime. What changed: how much to take   amLODipine 10 MG tablet Commonly known as: NORVASC Take 1 tablet (10 mg total) by mouth daily.   aspirin 81 MG chewable tablet Chew 1 tablet (81 mg total) by mouth daily.   atorvastatin 10 MG tablet Commonly known as: LIPITOR Take 1 tablet (10 mg total) by mouth daily.   benzonatate 100 MG capsule Commonly known as: TESSALON Take 1 capsule (100 mg total) by mouth 3 (three) times daily.   cloNIDine 0.1 mg/24hr patch Commonly known as: CATAPRES - Dosed in mg/24 hr Place 1 patch (0.1 mg total) onto the skin once a week.   FLUoxetine 20 MG capsule Commonly known as: PROZAC TAKE 1 CAPSULE BY MOUTH  DAILY   fluticasone 50 MCG/ACT nasal spray Commonly  known as: FLONASE Place 1 spray into both nostrils daily.   glipiZIDE 10 MG 24 hr tablet Commonly known as: GLUCOTROL XL TAKE 1 TABLET BY MOUTH  DAILY What changed: when to take this   guaiFENesin 100 MG/5ML Soln Commonly known as: ROBITUSSIN Take 5 mLs (100 mg total) by mouth every 4 (four) hours as needed for cough or to loosen phlegm.   Gvoke HypoPen 2-Pack 1 MG/0.2ML Soaj Generic drug: Glucagon Inject 1 Act into the skin daily as needed. What changed:   how much to take  when to take this  reasons to take this   hydrocortisone cream 1 % Apply 1 application topically 4 (four) times daily as needed for itching.   Insulin Pen Needle 32G X 6 MM Misc 1 Act by Does not apply route daily.   levETIRAcetam 500 MG tablet Commonly known as: KEPPRA Take 500 mg by mouth 2 (two) times daily. What changed: Another medication with the same name was removed. Continue taking this medication, and follow the directions you see here.   loratadine 10 MG tablet Commonly known as: CLARITIN Take 1 tablet (10 mg total) by mouth every evening.   pantoprazole 40 MG tablet Commonly known as: PROTONIX TAKE 1 TABLET BY MOUTH  DAILY    PRESCRIPTION MEDICATION CPAP- At bedtime   senna-docusate 8.6-50 MG tablet Commonly known as: Senokot-S Take 1 tablet by mouth at bedtime as needed for mild constipation.   solifenacin 10 MG tablet Commonly known as: VESICARE Take 1 tablet (10 mg total) by mouth daily.   ticagrelor 90 MG Tabs tablet Commonly known as: BRILINTA Take 0.5 tablets (45 mg total) by mouth 2 (two) times daily.   Toujeo Max SoloStar 300 UNIT/ML Solostar Pen Generic drug: insulin glargine (2 Unit Dial) Inject 10 Units into the skin daily.         Contact information for after-discharge care    Destination    [facility] Preferred SNF .   Service: Skilled Nursing Contact information: [address] [place] [phone]                  TOTAL DISCHARGE TIME: 32 minutes  [place]  [facility] Pager on [email or web address]  11/25/2020, 9:20 AM

## 2020-11-26 ENCOUNTER — Telehealth: Payer: Self-pay | Admitting: Internal Medicine

## 2020-11-26 NOTE — Telephone Encounter (Signed)
LVM for pt to rtn my call to schedule AWV with [facility]. Please schedule AWV if pt calls the office  

## 2020-12-02 DIAGNOSIS — I69391 Dysphagia following cerebral infarction: Secondary | ICD-10-CM | POA: Diagnosis not present

## 2020-12-02 DIAGNOSIS — R5383 Other fatigue: Secondary | ICD-10-CM | POA: Diagnosis not present

## 2020-12-02 DIAGNOSIS — R0989 Other specified symptoms and signs involving the circulatory and respiratory systems: Secondary | ICD-10-CM | POA: Diagnosis not present

## 2020-12-02 DIAGNOSIS — R059 Cough, unspecified: Secondary | ICD-10-CM | POA: Diagnosis not present

## 2020-12-03 NOTE — Progress Notes (Signed)
Remote pacemaker transmission.   

## 2020-12-04 DIAGNOSIS — R059 Cough, unspecified: Secondary | ICD-10-CM | POA: Diagnosis not present

## 2020-12-04 DIAGNOSIS — Z87891 Personal history of nicotine dependence: Secondary | ICD-10-CM | POA: Diagnosis not present

## 2020-12-04 DIAGNOSIS — J40 Bronchitis, not specified as acute or chronic: Secondary | ICD-10-CM | POA: Diagnosis not present

## 2020-12-05 ENCOUNTER — Other Ambulatory Visit: Payer: Self-pay

## 2020-12-05 ENCOUNTER — Non-Acute Institutional Stay: Payer: Medicare Other | Admitting: Hospice

## 2020-12-05 DIAGNOSIS — J209 Acute bronchitis, unspecified: Secondary | ICD-10-CM | POA: Diagnosis not present

## 2020-12-05 DIAGNOSIS — J440 Chronic obstructive pulmonary disease with acute lower respiratory infection: Secondary | ICD-10-CM

## 2020-12-05 DIAGNOSIS — Z515 Encounter for palliative care: Secondary | ICD-10-CM | POA: Diagnosis not present

## 2020-12-05 DIAGNOSIS — R531 Weakness: Secondary | ICD-10-CM | POA: Diagnosis not present

## 2020-12-05 DIAGNOSIS — I639 Cerebral infarction, unspecified: Secondary | ICD-10-CM | POA: Diagnosis not present

## 2020-12-05 NOTE — Progress Notes (Signed)
Frank [name] Consult Note Telephone: [phone]  Fax: [phone]  PATIENT NAME: Frank [name] [address] [place] [address] [phone] (home)  DOB: 1941/04/18 MRN: [id number]  PRIMARY CARE PROVIDER:    Janith Lima, MD,  [place] [address] [phone]  REFERRING PROVIDER:   Martinique Miller NP RESPONSIBLE PARTY:   Self Contact Information    Name Relation Home Work Mobile   Frank [name] Spouse [phone]  [phone]   Frank [name]   [phone]   Frank [name]   [phone]       I met face to face with patient and family at facility. Palliative Care was asked to follow this patient by consultation request of  Frank Miller NP to address advance care planning, complex medical decision making and goals of care clarification.  Patient endorsed palliative service.  This is the initial visit.    ASSESSMENT AND / RECOMMENDATIONS:   Advance Care Planning: Our advance care planning conversation included a discussion about:     The value and importance of advance care planning   Difference between Hospice and Palliative care  Exploration of goals of care in the event of a sudden injury or illness   Identification and preparation of a healthcare agent   Review and updating or creation of an  advance directive document .   CODE STATUS: Patient affirmed he is a Do Not Resuscitate  Goals of Care: Goals include to maximize quality of life and symptom management.  MOST form selection include no feeding tube, limited additional intervention, antibiotics if indicated, IV fluids if indicated.  I spent 25  minutes providing this initial consultation. More than 50% of the time in this consultation was spent on counseling patient and coordinating  communication. --------------------------------------------------------------------------------------------------------------------------------------  Symptom Management/Plan: Weakness: Related to recent cerebral infarction and hospitalization. PT/OT/ST is ongoing. Falls precautions discussed. Type 2 DM: Continue Insulin, Glipizide. A1c 7.7 11/19/2020.  Repeat A1c every 3 to 6 months. Bronchitis: Continue Azythromycin and Prednisone as ordered to completion. Guaifenisin for cough. Routine CBC BMP Follow up: Palliative care will continue to follow for complex medical decision making, advance care planning, and clarification of goals. Return 6 weeks or prn.Encouraged to call provider sooner with any concerns.   Family /Caregiver/Community Supports: Patient in SNF for acute rehab  PPS: 40%  HOSPICE ELIGIBILITY/DIAGNOSIS: TBD  Chief Complaint: Initial Palliative care visit:weakness  HISTORY OF PRESENT ILLNESS:  Frank [name] is a 80 y.o. year old male  with multiple medical conditions including weakness - acute related to recent cerebral infarction for which he was hospitalized 5/9-5/16/2022. Patient reports he is so weak, worse as evening approaches that it affects his ADLs and self care; ongoing physical therapy is helpful. Patient on antibiotics for bronchitis; denies fever chills, chest pain. History of Hypertension, Type 2 DM, Depression.  History obtained from review of EMR, discussion with primary team, caregiver, family and/or Frank [name].  Review and summarization of Epic records shows history from other than patient. Rest of 10 point ROS asked and negative.     Review of lab tests/diagnostics    COMPREHENSIVE METABOLIC PANEL at facility 12/04/20     GLUCOSE 147 mg/dL 65-99 H Final                 Fasting reference interval   For someone without known diabetes, a glucose value >125 mg/dL indicates that they may have diabetes and this should be confirmed with  a follow-up  test.       UREA NITROGEN (BUN) 39 mg/dL 7-25 H Final        CREATININE 1.52 mg/dL 0.70-1.11 H Final    For patients >40 years of age, the reference limit for Creatinine is approximately 13% higher for people identified as African-American.       eGFR NON-AFR. AMERICAN 42 mL/min/1.44m > OR = 60 L Final        eGFR AFRICAN AMERICAN 49 mL/min/1.745m> OR = 60 L Final        BUN/CREATININE RATIO 26 (calc) 6-22 H Final        SODIUM 137 mmol/L 135-146  Final        POTASSIUM 4.1 mmol/L 3.5-5.3  Final        CHLORIDE 98 mmol/L 98-110  Final        CARBON DIOXIDE 32 mmol/L 20-32  Final        CALCIUM 8.6 mg/dL 8.6-10.3  Final        PROTEIN, TOTAL 5.3 g/dL 6.1-8.1 L Final        ALBUMIN 3.0 g/dL 3.6-5.1 L Final        GLOBULIN 2.3 g/dL (calc) 1.9-3.7  Final        ALBUMIN/GLOBULIN RATIO 1.3 (calc) 1.0-2.5  Final        BILIRUBIN, TOTAL 0.3 mg/dL 0.2-1.2  Final        ALKALINE PHOSPHATASE 85 U/L 35-144  Final        AST 29 U/L 10-35  Final        ALT 46 U/L 9-46  Final     CBC (INCLUDES DIFF/PLT)     WHITE BLOOD CELL COUNT 7.0 Thousand/uL 3.8-10.8  Final        RED BLOOD CELL COUNT 3.65 Million/uL 4.20-5.80 L Final        HEMOGLOBIN 9.7 g/dL 13.2-17.1 L Final        HEMATOCRIT 30.4 % 38.5-50.0 L Final        MCV 83.3 fL 80.0-100.0  Final        MCH 26.6 pg 27.0-33.0 L Final        MCHC 31.9 g/dL 32.0-36.0 L Final        RDW 13.5 % 11.0-15.0  Final        PLATELET COUNT 224 Thousand/uL 140-400  Final        MPV 9.4 fL 7.5-12.5  Final        ABSOLUTE NEUTROPHILS 4312 cells/uL 1500-7800  Final        ABSOLUTE LYMPHOCYTES 938 cells/uL 306 640 6708  Final        ABSOLUTE MONOCYTES 952 cells/uL 200-950 H Final        ABSOLUTE EOSINOPHILS 735 cells/uL 15-500 H Final        ABSOLUTE BASOPHILS 63 cells/uL 0-200  Final        NEUTROPHILS 61.6 %   Final        LYMPHOCYTES 13.4 %   Final         MONOCYTES 13.6 %   Final        EOSINOPHILS 10.5 %   Final        BASOPHILS 0.9 %   Final     Clinical PDF Report [id number] See Attachment    Final  Results for Frank [name](MRN [id number] of 12/05/2020 14:04  Ref. Range 11/19/2020 03:24  Hemoglobin A1C Latest Ref Range: 4.8 - 5.6 % 7.7 (H)  ROS General: NAD EYES: denies vision changes ENMT: denies dysphagia Cardiovascular: denies chest pain/discomfort Pulmonary: occasional productive cough, denies SOB Abdomen: endorses good appetite, denies constipation/diarrhea GU: denies dysuria, urinary frequency MSK: Endorses weakness no falls reported Skin: denies rashes or wounds Neurological: denies pain, denies insomnia Psych: Endorses positive mood Heme/lymph/immuno: denies bruises, abnormal bleeding  Physical Exam Height/Weight 5 feet 9 inches/ 182 Ibs Constitutional: NAD General: Well groomed, cooperative EYES: anicteric sclera, lids intact, no discharge  ENMT: Moist mucous membrane CV: S1 S2, RRR, no LE edema Pulmonary: no increased work of breathing, no cough during visit, 02 supplementation 2 L/min, saturation 98% Abdomen: active BS + 4 quadrants, soft and non tender GU: no suprapubic tenderness MSK: weakness, limited ROM Skin: warm and dry, no rashes or wounds on visible skin Neuro:  weakness, otherwise non focal Psych: non-anxious affect Hem/lymph/immuno: no widespread bruising   PAST MEDICAL HISTORY:  Active Ambulatory Problems    Diagnosis Date Noted  . Hyperlipidemia with target LDL less than 100 07/27/2007  . B12 deficiency anemia 04/15/2006  . Obstructive sleep apnea 09/13/2008  . 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 Essential hypertension 04/15/2006  . Allergic rhinitis 04/15/2006  . BPH (benign prostatic hyperplasia) 06/21/2007  . Vitamin D deficiency 07/23/2010  . Routine general medical examination at a health care facility 04/06/2014  . OAB (overactive bladder)  11/26/2014  . Depression   . Type 2 diabetes mellitus with peripheral neuropathy ([name])   . GERD without esophagitis 07/22/2017  . Chronic renal disease, stage 3, moderately decreased glomerular filtration rate (GFR) between 30-59 mL/min/1.73 square meter ([place]) 11/21/2018  . 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 DNR (do not resuscitate) discussion 10/17/2020  . Iron deficiency anemia secondary to inadequate dietary iron intake 11/06/2020  . Simple chronic bronchitis ([place]) 11/13/2020   Resolved Ambulatory Problems    Diagnosis Date Noted  . Type II diabetes mellitus with manifestations ([place]) 04/15/2006  . Depression 12/25/2009  . Ataxia 05/17/2014  . Onychomycosis of right great toe 07/10/2015  . Cough 10/31/2015  . Otitis media 10/31/2015  . Mass of left hand 12/11/2015  . Asthma with acute exacerbation 03/18/2016  . Acute bronchitis 03/18/2016  . Deficiency anemia 06/16/2016  . Morbid obesity ([place]) 05/12/2017  . Intracranial hematoma following injury ([place]) 06/23/2017  . ICH (intracerebral hemorrhage) ([place]) 06/23/2017  . H/O clavicle fracture   . Benign essential HTN   . Hyperlipidemia   . Traumatic intracranial hemorrhage ([place]) 06/28/2017  . Acute blood loss anemia   . Stage 2 chronic kidney disease   . Hyponatremia   . Stage 3 chronic kidney disease ([place])   . Need for shingles vaccine 07/20/2017  . Subdural hematoma ([place]) 08/04/2017  . SDH (subdural hematoma) ([place]) 08/05/2017  . Arthritis of left hand 12/26/2015  . Subdural hematoma without coma ([place]) 11/30/2017  . SDH (subdural hematoma) ([place]) 12/01/2017  . S/P craniotomy 12/01/2017  . RTI (respiratory tract infection) 11/21/2018  . Cough 11/21/2018  . Thrombocytopenia ([place]) 11/30/2018  . Deficiency anemia 04/20/2019  . Long toenail  04/20/2019  . Need for Tdap vaccination 11/28/2019  . Tick bite of abdomen 12/18/2019  . Contusion of scalp 02/01/2020  . Cellulitis of back except buttock 05/09/2020  . Wound infection, posttraumatic 05/09/2020  . Aspiration pneumonia ([place]) 10/17/2020  . Deficiency anemia 11/05/2020   Past  Medical History:  Diagnosis Date  . Allergy   . Anemia   . Arthritis   . Diabetes mellitus   . GERD (gastroesophageal reflux disease)   . Hypertension   . Neuropathy 2001  . OSA (obstructive sleep apnea)   . TBI (traumatic brain injury) ([place])     SOCIAL HX:  Social History   Tobacco Use  . Smoking status: Former Smoker    Packs/day: 1.00    Quit date: 07/14/1983    Years since quitting: 37.4  . Smokeless tobacco: Never Used  Substance Use Topics  . Alcohol use: Yes    Alcohol/week: 2.0 standard drinks    Types: 2 Standard drinks or equivalent per week    Comment: occassionally     FAMILY HX:  Family History  Problem Relation Age of Onset  . Heart disease Mother   . Breast cancer Mother   . Stroke Father   . Allergies Father        Father and children  . Coronary artery disease Brother   . Diabetes Neg Hx   . Colon cancer Neg Hx       ALLERGIES:  Allergies  Allergen Reactions  . Lisinopril Cough  . Invokana [Canagliflozin] Other (See Comments)    Stopped by MD  . Penicillins Other (See Comments)    Allergic as a child.  Patient does not remember reaction.  Has had cephalasporins without problems.  . Sulfamethoxazole Other (See Comments)    Unknown reaction- from childhood      PERTINENT MEDICATIONS:  Outpatient Encounter Medications as of 12/05/2020  Medication Sig  . amLODipine (NORVASC) 10 MG tablet Take 1 tablet (10 mg total) by mouth daily.  [name] aspirin 81 MG chewable tablet Chew 1 tablet (81 mg total) by mouth daily.  [name] atorvastatin (LIPITOR) 10 MG tablet Take 1 tablet (10 mg total) by mouth daily.  . benzonatate (TESSALON) 100 MG capsule Take 1 capsule (100 mg  total) by mouth 3 (three) times daily.  . cloNIDine (CATAPRES - DOSED IN MG/24 HR) 0.1 mg/24hr patch Place 1 patch (0.1 mg total) onto the skin once a week.  . Ferric Maltol (ACCRUFER) 30 MG CAPS Take 1 capsule by mouth in the morning and at bedtime. (Patient taking differently: Take 30 mg by mouth in the morning and at bedtime.)  . FLUoxetine (PROZAC) 20 MG capsule TAKE 1 CAPSULE BY MOUTH  DAILY (Patient taking differently: Take 20 mg by mouth daily.)  . fluticasone (FLONASE) 50 MCG/ACT nasal spray Place 1 spray into both nostrils daily.  [name] glipiZIDE (GLUCOTROL XL) 10 MG 24 hr tablet TAKE 1 TABLET BY MOUTH  DAILY (Patient taking differently: Take 10 mg by mouth daily with breakfast.)  . Glucagon (GVOKE HYPOPEN 2-PACK) 1 MG/0.2ML SOAJ Inject 1 Act into the skin daily as needed. (Patient taking differently: Inject 1 each into the skin as needed (low blood sugar).)  . guaiFENesin (ROBITUSSIN) 100 MG/5ML SOLN Take 5 mLs (100 mg total) by mouth every 4 (four) hours as needed for cough or to loosen phlegm.  . hydrocortisone cream 1 % Apply 1 application topically 4 (four) times daily as needed for itching.  . insulin glargine, 2 Unit Dial, (TOUJEO MAX SOLOSTAR) 300 UNIT/ML Solostar Pen Inject 10 Units into the skin daily.  . Insulin Pen Needle 32G X 6 MM MISC 1 Act by Does not apply route daily.  [name] levETIRAcetam (KEPPRA) 500 MG tablet Take 500 mg by mouth 2 (two) times daily.  [name]  loratadine (CLARITIN) 10 MG tablet Take 1 tablet (10 mg total) by mouth every evening.  . pantoprazole (PROTONIX) 40 MG tablet TAKE 1 TABLET BY MOUTH  DAILY (Patient taking differently: Take 40 mg by mouth daily.)  . PRESCRIPTION MEDICATION CPAP- At bedtime  . senna-docusate (SENOKOT-S) 8.6-50 MG tablet Take 1 tablet by mouth at bedtime as needed for mild constipation.  . solifenacin (VESICARE) 10 MG tablet Take 1 tablet (10 mg total) by mouth daily.  . ticagrelor (BRILINTA) 90 MG TABS tablet Take 0.5 tablets (45 mg total) by  mouth 2 (two) times daily.   No facility-administered encounter medications on file as of 12/05/2020.     Thank you for the opportunity to participate in the care of Mr. Chiarelli.  The palliative care team will continue to follow. Please call our office at [phone] if we can be of additional assistance.   Note: Portions of this note were generated with Lobbyist. Dictation errors may occur despite best attempts at proofreading.  Teodoro Spray, NP

## 2020-12-06 DIAGNOSIS — R5383 Other fatigue: Secondary | ICD-10-CM | POA: Diagnosis not present

## 2020-12-06 DIAGNOSIS — R0989 Other specified symptoms and signs involving the circulatory and respiratory systems: Secondary | ICD-10-CM | POA: Diagnosis not present

## 2020-12-06 DIAGNOSIS — R059 Cough, unspecified: Secondary | ICD-10-CM | POA: Diagnosis not present

## 2020-12-06 DIAGNOSIS — J40 Bronchitis, not specified as acute or chronic: Secondary | ICD-10-CM | POA: Diagnosis not present

## 2020-12-06 DIAGNOSIS — Z9981 Dependence on supplemental oxygen: Secondary | ICD-10-CM | POA: Diagnosis not present

## 2020-12-06 DIAGNOSIS — J90 Pleural effusion, not elsewhere classified: Secondary | ICD-10-CM | POA: Diagnosis not present

## 2020-12-10 DIAGNOSIS — M6281 Muscle weakness (generalized): Secondary | ICD-10-CM | POA: Diagnosis not present

## 2020-12-10 DIAGNOSIS — J40 Bronchitis, not specified as acute or chronic: Secondary | ICD-10-CM | POA: Diagnosis not present

## 2020-12-10 DIAGNOSIS — E1165 Type 2 diabetes mellitus with hyperglycemia: Secondary | ICD-10-CM | POA: Diagnosis not present

## 2020-12-13 ENCOUNTER — Other Ambulatory Visit: Payer: Medicare Other | Admitting: Nurse Practitioner

## 2020-12-13 ENCOUNTER — Other Ambulatory Visit: Payer: Self-pay

## 2020-12-14 ENCOUNTER — Emergency Department (HOSPITAL_COMMUNITY)
Admission: EM | Admit: 2020-12-14 | Discharge: 2020-12-14 | Disposition: A | Discharge status: Home / Self Care | Payer: Medicare Other | Attending: Emergency Medicine | Admitting: Emergency Medicine

## 2020-12-14 ENCOUNTER — Emergency Department (HOSPITAL_COMMUNITY): Payer: Medicare Other

## 2020-12-14 ENCOUNTER — Other Ambulatory Visit: Payer: Self-pay

## 2020-12-14 DIAGNOSIS — S0990XA Unspecified injury of head, initial encounter: Secondary | ICD-10-CM | POA: Diagnosis present

## 2020-12-14 DIAGNOSIS — Z7984 Long term (current) use of oral hypoglycemic drugs: Secondary | ICD-10-CM | POA: Diagnosis not present

## 2020-12-14 DIAGNOSIS — F0390 Unspecified dementia without behavioral disturbance: Secondary | ICD-10-CM | POA: Insufficient documentation

## 2020-12-14 DIAGNOSIS — Y92129 Unspecified place in nursing home as the place of occurrence of the external cause: Secondary | ICD-10-CM | POA: Diagnosis not present

## 2020-12-14 DIAGNOSIS — Z794 Long term (current) use of insulin: Secondary | ICD-10-CM | POA: Diagnosis not present

## 2020-12-14 DIAGNOSIS — Z87891 Personal history of nicotine dependence: Secondary | ICD-10-CM | POA: Diagnosis not present

## 2020-12-14 DIAGNOSIS — R5381 Other malaise: Secondary | ICD-10-CM | POA: Diagnosis not present

## 2020-12-14 DIAGNOSIS — I129 Hypertensive chronic kidney disease with stage 1 through stage 4 chronic kidney disease, or unspecified chronic kidney disease: Secondary | ICD-10-CM | POA: Diagnosis not present

## 2020-12-14 DIAGNOSIS — Z950 Presence of cardiac pacemaker: Secondary | ICD-10-CM | POA: Insufficient documentation

## 2020-12-14 DIAGNOSIS — S0003XA Contusion of scalp, initial encounter: Secondary | ICD-10-CM | POA: Diagnosis not present

## 2020-12-14 DIAGNOSIS — I739 Peripheral vascular disease, unspecified: Secondary | ICD-10-CM | POA: Diagnosis not present

## 2020-12-14 DIAGNOSIS — E1140 Type 2 diabetes mellitus with diabetic neuropathy, unspecified: Secondary | ICD-10-CM | POA: Diagnosis not present

## 2020-12-14 DIAGNOSIS — R0902 Hypoxemia: Secondary | ICD-10-CM | POA: Diagnosis not present

## 2020-12-14 DIAGNOSIS — Z7982 Long term (current) use of aspirin: Secondary | ICD-10-CM | POA: Insufficient documentation

## 2020-12-14 DIAGNOSIS — W06XXXA Fall from bed, initial encounter: Secondary | ICD-10-CM | POA: Diagnosis not present

## 2020-12-14 DIAGNOSIS — G9389 Other specified disorders of brain: Secondary | ICD-10-CM | POA: Diagnosis not present

## 2020-12-14 DIAGNOSIS — J3489 Other specified disorders of nose and nasal sinuses: Secondary | ICD-10-CM | POA: Diagnosis not present

## 2020-12-14 DIAGNOSIS — N183 Chronic kidney disease, stage 3 unspecified: Secondary | ICD-10-CM | POA: Diagnosis not present

## 2020-12-14 DIAGNOSIS — Z79899 Other long term (current) drug therapy: Secondary | ICD-10-CM | POA: Diagnosis not present

## 2020-12-14 DIAGNOSIS — W19XXXA Unspecified fall, initial encounter: Secondary | ICD-10-CM | POA: Diagnosis not present

## 2020-12-14 DIAGNOSIS — Z743 Need for continuous supervision: Secondary | ICD-10-CM | POA: Diagnosis not present

## 2020-12-14 DIAGNOSIS — R531 Weakness: Secondary | ICD-10-CM | POA: Diagnosis not present

## 2020-12-14 DIAGNOSIS — S065X0A Traumatic subdural hemorrhage without loss of consciousness, initial encounter: Secondary | ICD-10-CM | POA: Diagnosis not present

## 2020-12-14 NOTE — ED Notes (Signed)
Attempted to call facility x1.

## 2020-12-14 NOTE — ED Notes (Signed)
[facility] Called 

## 2020-12-14 NOTE — ED Triage Notes (Signed)
BIB [facility] from [facility] health care. Facility states that he rolled out of bed and hit left side of head. It was an unwitnessed fall. Pt is not on any blood thinner. No LOC. Wife wanted pt to come and be evaluated. No pain  Hx: dementia. A&Ox4.   148/74 92 2L  @ baseline 18 RR 67 heart rate

## 2020-12-14 NOTE — ED Provider Notes (Signed)
[facility] EMERGENCY DEPARTMENT Provider Note   CSN: [id number] Arrival date & time: 12/14/20  0516     History Chief Complaint  Patient presents with  . Fall    Frank [name] is a 80 y.o. male.  Patient presents to the emergency department for evaluation after a fall.  Patient reports that he rolled out of bed and hit the left side of his head.  He has some bruising on the left side of his face and scalp.  Denies any other injury or pain elsewhere.        Past Medical History:  Diagnosis Date  . Allergy    Rhinitis  . Anemia    NOS iron deficient and B12 deficient  . Arthritis   . 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 Diabetes mellitus    Type 2  . GERD (gastroesophageal reflux disease)   . Hyperlipidemia   . Hypertension   . 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 OSA (obstructive sleep apnea)    cpap  . TBI (traumatic brain injury) [facility])     Patient Active Problem List   Diagnosis Date Noted  . Simple chronic bronchitis ([place]) 11/13/2020  . Iron deficiency anemia secondary to inadequate dietary iron intake 11/06/2020  . DNR (do not resuscitate) discussion 10/17/2020  . Stroke (cerebrum) ([place]) 09/09/2020  . 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 Chronic renal disease, stage 3, moderately decreased glomerular filtration rate (GFR) between 30-59 mL/min/1.73 square meter ([place]) 11/21/2018  . GERD without esophagitis 07/22/2017  . Depression   . Type 2 diabetes mellitus with peripheral neuropathy ([name])   . OAB (overactive bladder) 11/26/2014  . Routine general medical examination at a health care facility 04/06/2014  . Vitamin D deficiency 07/23/2010  . Obstructive sleep apnea 09/13/2008  . Hyperlipidemia with target LDL less than 100 07/27/2007  . BPH (benign prostatic hyperplasia) 06/21/2007   . 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 B12 deficiency anemia 04/15/2006  . Essential hypertension 04/15/2006  . Allergic rhinitis 04/15/2006    Past Surgical History:  Procedure Laterality Date  . APPENDECTOMY    . arm fracture Left 2011   with hardware  . BURR HOLE Bilateral 08/04/2017   Procedure: Haskell Flirt;  Surgeon: [name], MD;  Location: [place];  Service: Neurosurgery;  Laterality: Bilateral;  . COLONOSCOPY    . CRANIOTOMY Left 12/01/2017   Procedure: Trudee Kuster Holes CRANIOTOMY HEMATOMA EVACUATION SUBDURAL;  Surgeon: [name], MD;  Location: [place];  Service: Neurosurgery;  Laterality: Left;  . ESOPHAGOGASTRODUODENOSCOPY  2006   gastritis  . IR PERCUTANEOUS ART THROMBECTOMY/INFUSION INTRACRANIAL INC DIAG ANGIO  09/09/2020  . PACEMAKER IMPLANT N/A 05/14/2020   Procedure: PACEMAKER IMPLANT;  Surgeon: [name], MD;  Location: [place] [facility];  Service: Cardiovascular;  Laterality: N/A;  . RADIOLOGY WITH ANESTHESIA N/A 09/09/2020   Procedure: IR WITH ANESTHESIA;  Surgeon: Radiologist, Medication, MD;  Location: [place];  Service: Radiology;  Laterality: N/A;  . ROTATOR CUFF REPAIR    . SEPTOPLASTY  1959   Deviated Septum  . 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 Heart disease Mother   . Breast cancer Mother   . Stroke Father   . Allergies Father  Father and children  . Coronary artery disease Brother   . Diabetes Neg Hx   . Colon cancer Neg Hx     Social History   Tobacco Use  . Smoking status: Former Smoker    Packs/day: 1.00    Quit date: 07/14/1983    Years since quitting: 37.4  . Smokeless tobacco: Never Used  Vaping Use  . Vaping Use: Never used  Substance Use Topics  . Alcohol use: Yes    Alcohol/week: 2.0 standard drinks    Types: 2 Standard drinks or equivalent per week    Comment: occassionally  . Drug use: No    Home Medications Prior to Admission medications   Medication Sig  Start Date End Date Taking? Authorizing Provider  amLODipine (NORVASC) 10 MG tablet Take 1 tablet (10 mg total) by mouth daily. 09/18/20   Briant Cedar, MD  aspirin 81 MG chewable tablet Chew 1 tablet (81 mg total) by mouth daily. 09/18/20   Briant Cedar, MD  atorvastatin (LIPITOR) 10 MG tablet Take 1 tablet (10 mg total) by mouth daily. 09/18/20   Briant Cedar, MD  benzonatate (TESSALON) 100 MG capsule Take 1 capsule (100 mg total) by mouth 3 (three) times daily. 11/22/20   Bonnielee Haff, MD  cloNIDine (CATAPRES - DOSED IN MG/24 HR) 0.1 mg/24hr patch Place 1 patch (0.1 mg total) onto the skin once a week. 11/13/20   Janith Lima, MD  Ferric Maltol (ACCRUFER) 30 MG CAPS Take 1 capsule by mouth in the morning and at bedtime. Patient taking differently: Take 30 mg by mouth in the morning and at bedtime. 11/06/20   Janith Lima, MD  FLUoxetine (PROZAC) 20 MG capsule TAKE 1 CAPSULE BY MOUTH  DAILY Patient taking differently: Take 20 mg by mouth daily. 09/25/20   Janith Lima, MD  fluticasone (FLONASE) 50 MCG/ACT nasal spray Place 1 spray into both nostrils daily.    [provider]  glipiZIDE (GLUCOTROL XL) 10 MG 24 hr tablet TAKE 1 TABLET BY MOUTH  DAILY Patient taking differently: Take 10 mg by mouth daily with breakfast. 09/25/20   Janith Lima, MD  Glucagon (GVOKE HYPOPEN 2-PACK) 1 MG/0.2ML SOAJ Inject 1 Act into the skin daily as needed. Patient taking differently: Inject 1 each into the skin as needed (low blood sugar). 04/30/20   Janith Lima, MD  guaiFENesin (ROBITUSSIN) 100 MG/5ML SOLN Take 5 mLs (100 mg total) by mouth every 4 (four) hours as needed for cough or to loosen phlegm. 11/21/20   Bonnielee Haff, MD  hydrocortisone cream 1 % Apply 1 application topically 4 (four) times daily as needed for itching.    [provider]  insulin glargine, 2 Unit Dial, (TOUJEO MAX SOLOSTAR) 300 UNIT/ML Solostar Pen Inject 10 Units into the skin daily.  04/16/20   Janith Lima, MD  Insulin Pen Needle 32G X 6 MM MISC 1 Act by Does not apply route daily. 04/08/20   Janith Lima, MD  levETIRAcetam (KEPPRA) 500 MG tablet Take 500 mg by mouth 2 (two) times daily. 10/16/20   [provider]  loratadine (CLARITIN) 10 MG tablet Take 1 tablet (10 mg total) by mouth every evening. 09/18/20   Briant Cedar, MD  pantoprazole (PROTONIX) 40 MG tablet TAKE 1 TABLET BY MOUTH  DAILY Patient taking differently: Take 40 mg by mouth daily. 10/03/20   Janith Lima, MD  PRESCRIPTION MEDICATION CPAP- At bedtime    [provider]  senna-docusate (SENOKOT-S) 8.6-50 MG tablet Take 1 tablet by mouth at bedtime as needed for mild constipation. 11/21/20   Bonnielee Haff, MD  solifenacin (VESICARE) 10 MG tablet Take 1 tablet (10 mg total) by mouth daily. 11/14/20   Janith Lima, MD  ticagrelor (BRILINTA) 90 MG TABS tablet Take 0.5 tablets (45 mg total) by mouth 2 (two) times daily. 11/14/20   Janith Lima, MD    Allergies    Lisinopril, Invokana [canagliflozin], Penicillins, and Sulfamethoxazole  Review of Systems   Review of Systems  Neurological: Positive for headaches.  All other systems reviewed and are negative.   Physical Exam Updated Vital Signs BP (!) 162/74   Pulse 76   Temp 98.3 F (36.8 C) (Oral)   Resp 17   SpO2 100%   Physical Exam Vitals and nursing note reviewed.  Constitutional:      General: He is not in acute distress.    Appearance: Normal appearance. He is well-developed.  HENT:     Head: Normocephalic. Contusion present. No laceration.      Right Ear: Hearing normal.     Left Ear: Hearing normal.     Nose: Nose normal.  Eyes:     Conjunctiva/sclera: Conjunctivae normal.     Pupils: Pupils are equal, round, and reactive to light.  Cardiovascular:     Rate and Rhythm: Regular rhythm.     Heart sounds: S1 normal and S2 normal. No murmur heard. No friction rub. No gallop.   Pulmonary:     Effort:  Pulmonary effort is normal. No respiratory distress.     Breath sounds: Normal breath sounds.  Chest:     Chest wall: No tenderness.  Abdominal:     General: Bowel sounds are normal.     Palpations: Abdomen is soft.     Tenderness: There is no abdominal tenderness. There is no guarding or rebound. Negative signs include Murphy's sign and McBurney's sign.     Hernia: No hernia is present.  Musculoskeletal:        General: Normal range of motion.     Cervical back: Normal range of motion and neck supple.  Skin:    General: Skin is warm and dry.     Findings: No rash.  Neurological:     Mental Status: He is alert and oriented to person, place, and time.     GCS: GCS eye subscore is 4. GCS verbal subscore is 5. GCS motor subscore is 6.     Cranial Nerves: No cranial nerve deficit.     Sensory: No sensory deficit.     Coordination: Coordination normal.  Psychiatric:        Speech: Speech normal.        Behavior: Behavior normal.        Thought Content: Thought content normal.     ED Results / Procedures / Treatments   Labs (all labs ordered are listed, but only abnormal results are displayed) Labs Reviewed - No data to display  EKG None  Radiology CT HEAD WO CONTRAST  Result Date: 12/14/2020 CLINICAL DATA:  80 year old male status post fall. EXAM: CT HEAD WITHOUT CONTRAST TECHNIQUE: Contiguous axial images were obtained from the base of the skull through the vertex without intravenous contrast. COMPARISON:  Brain MRI, CTA and head CT 11/18/2020. 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  Minimal associated hemispheric mass effect is stable. Occasional hyperdense blood products are stable. 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 Other bilateral chronic small vessel disease. No ventriculomegaly. No  midline shift or acute intracranial mass effect. No acute intracranial hemorrhage or acute cortically based infarct identified. Vascular: Chronic posterior right MCA branch stent. Calcified atherosclerosis at the skull base. Skull: Stable left superior convexity craniotomy and right superior frontal burr hole. No acute osseous abnormality identified. Sinuses/Orbits: Visualized paranasal sinuses and mastoids are stable and well aerated., mild ethmoid mucosal thickening. Other: No acute orbit or scalp soft tissue finding. IMPRESSION: 1. Stable since last month.  No acute traumatic injury identified. 2. Stable unresolved bilateral mixed density subdural hematomas, up to 9-10 mm in thickness. No significant intracranial mass effect. 3. Posterior right MCA stent with right MCA territory encephalomalacia. Electronically Signed   By: Genevie Ann M.D.   On: 12/14/2020 06:03    Procedures Procedures   Medications Ordered in ED Medications - No data to display  ED Course  I have reviewed the triage vital signs and the nursing notes.  Pertinent labs & imaging results that were available during my care of the patient were reviewed by me and considered in my medical decision making (see chart for details).    MDM Rules/Calculators/A&P                          Patient presents with head injury after a fall.  No other injury on examination.  CT head unremarkable.  No further work-up necessary.  Final Clinical Impression(s) / ED Diagnoses Final diagnoses:  Contusion of scalp, initial encounter    Rx / DC Orders ED Discharge Orders    None       Donnie Gedeon, Gwenyth Allegra, MD 12/14/20 [phone]

## 2020-12-14 NOTE — ED Notes (Signed)
Called to get an update on [facility], patient is on the list but it may be a while before [facility] can get here

## 2020-12-14 NOTE — ED Notes (Signed)
Updated family.

## 2020-12-14 NOTE — ED Notes (Signed)
Attempted to call report x 2. No answer. 

## 2020-12-16 ENCOUNTER — Telehealth: Payer: Self-pay | Admitting: Internal Medicine

## 2020-12-16 DIAGNOSIS — S0990XA Unspecified injury of head, initial encounter: Secondary | ICD-10-CM | POA: Diagnosis not present

## 2020-12-16 DIAGNOSIS — M6281 Muscle weakness (generalized): Secondary | ICD-10-CM | POA: Diagnosis not present

## 2020-12-16 DIAGNOSIS — W19XXXA Unspecified fall, initial encounter: Secondary | ICD-10-CM | POA: Diagnosis not present

## 2020-12-16 NOTE — Telephone Encounter (Signed)
Call [facility] and ask if they will accept him

## 2020-12-16 NOTE — Telephone Encounter (Signed)
Patients wife called and is wondering what she needs to do to move the patinet from [employer] to [employer]. she said that the patient has fallen out of the bed a few times in the last few weeks. She is requesting a call back and can be reached at [phone]. Please advise.

## 2020-12-17 NOTE — Telephone Encounter (Addendum)
LVM for Star (admissions) at [facility] to inquire about how to get the pt transferred.   [facility] [phone]

## 2020-12-18 ENCOUNTER — Other Ambulatory Visit: Payer: Self-pay

## 2020-12-18 NOTE — Patient Outreach (Signed)
[facility]) Care Management  12/18/2020  Frank [name] 08-29-1940 [id number]   First telephone outreach attempt to obtain mRS. No answer. Left message for returned call.  Philmore Pali [facility] Management Assistant [phone]

## 2020-12-18 NOTE — Telephone Encounter (Signed)
Followed up with Star again today, LVM.

## 2020-12-19 DIAGNOSIS — N1831 Chronic kidney disease, stage 3a: Secondary | ICD-10-CM | POA: Diagnosis not present

## 2020-12-19 DIAGNOSIS — M6281 Muscle weakness (generalized): Secondary | ICD-10-CM | POA: Diagnosis not present

## 2020-12-19 DIAGNOSIS — Z9981 Dependence on supplemental oxygen: Secondary | ICD-10-CM | POA: Diagnosis not present

## 2020-12-19 DIAGNOSIS — R1312 Dysphagia, oropharyngeal phase: Secondary | ICD-10-CM | POA: Diagnosis not present

## 2020-12-19 DIAGNOSIS — I10 Essential (primary) hypertension: Secondary | ICD-10-CM | POA: Diagnosis not present

## 2020-12-19 DIAGNOSIS — G4733 Obstructive sleep apnea (adult) (pediatric): Secondary | ICD-10-CM | POA: Diagnosis not present

## 2020-12-19 DIAGNOSIS — E1129 Type 2 diabetes mellitus with other diabetic kidney complication: Secondary | ICD-10-CM | POA: Diagnosis not present

## 2020-12-19 DIAGNOSIS — K219 Gastro-esophageal reflux disease without esophagitis: Secondary | ICD-10-CM | POA: Diagnosis not present

## 2020-12-20 NOTE — Telephone Encounter (Signed)
I have reached out to Star, she informed me that she has received my messages but was having a hard time getting through in the main office number. I provided her my direct desk ext. She stated she has been in contact with [facility] to get the pt transferred to [facility].

## 2020-12-23 ENCOUNTER — Telehealth (HOSPITAL_COMMUNITY): Payer: Self-pay

## 2020-12-23 NOTE — Telephone Encounter (Signed)
Called to schedule consult, no answer, left vm. AW  

## 2020-12-23 NOTE — Telephone Encounter (Signed)
-----   Message from [place], [place] sent at 12/23/2020  1:36 PM EDT ----- Regarding: RE: CTA f/u Please arrange to see Dev in consult ASAP.   Thanks, Sherlie Ban   ----- Message ----- From: Danielle Dess Sent: 12/23/2020   7:49 AM EDT To: Docia Barrier, PA Subject: CTA f/u                                        Sherlie Ban,   Mr. Rufo was due this month for cta head/neck with Deveshwar. He just had one done recently on 11/18/20 in epic.   Please review.   Thanks,  Lia Foyer

## 2020-12-24 ENCOUNTER — Other Ambulatory Visit: Payer: Self-pay

## 2020-12-24 DIAGNOSIS — R410 Disorientation, unspecified: Secondary | ICD-10-CM | POA: Diagnosis not present

## 2020-12-24 DIAGNOSIS — R5381 Other malaise: Secondary | ICD-10-CM | POA: Diagnosis not present

## 2020-12-24 DIAGNOSIS — R531 Weakness: Secondary | ICD-10-CM | POA: Diagnosis not present

## 2020-12-24 NOTE — Patient Outreach (Signed)
[facility]) Care Management  12/24/2020  Gabriele Loveland 07/14/1940 [id number]   Second telephone outreach attempt to obtain mRS. No answer. Left message for returned call.  Philmore Pali [facility] Management Assistant [phone]

## 2020-12-26 ENCOUNTER — Other Ambulatory Visit: Payer: Self-pay

## 2020-12-26 ENCOUNTER — Ambulatory Visit (INDEPENDENT_AMBULATORY_CARE_PROVIDER_SITE_OTHER): Payer: Medicare Other | Admitting: Internal Medicine

## 2020-12-26 ENCOUNTER — Encounter: Payer: Self-pay | Admitting: Internal Medicine

## 2020-12-26 VITALS — BP 110/60 | HR 80 | Temp 98.7°F | Ht 69.0 in

## 2020-12-26 DIAGNOSIS — Z7982 Long term (current) use of aspirin: Secondary | ICD-10-CM | POA: Diagnosis not present

## 2020-12-26 DIAGNOSIS — D510 Vitamin B12 deficiency anemia due to intrinsic factor deficiency: Secondary | ICD-10-CM

## 2020-12-26 DIAGNOSIS — F0280 Dementia in other diseases classified elsewhere without behavioral disturbance: Secondary | ICD-10-CM | POA: Diagnosis not present

## 2020-12-26 DIAGNOSIS — J410 Simple chronic bronchitis: Secondary | ICD-10-CM

## 2020-12-26 DIAGNOSIS — N1832 Chronic kidney disease, stage 3b: Secondary | ICD-10-CM | POA: Diagnosis not present

## 2020-12-26 DIAGNOSIS — M6281 Muscle weakness (generalized): Secondary | ICD-10-CM | POA: Diagnosis not present

## 2020-12-26 DIAGNOSIS — Z950 Presence of cardiac pacemaker: Secondary | ICD-10-CM | POA: Diagnosis not present

## 2020-12-26 DIAGNOSIS — E785 Hyperlipidemia, unspecified: Secondary | ICD-10-CM | POA: Diagnosis not present

## 2020-12-26 DIAGNOSIS — Z794 Long term (current) use of insulin: Secondary | ICD-10-CM | POA: Diagnosis not present

## 2020-12-26 DIAGNOSIS — Z87440 Personal history of urinary (tract) infections: Secondary | ICD-10-CM | POA: Diagnosis not present

## 2020-12-26 DIAGNOSIS — R531 Weakness: Secondary | ICD-10-CM | POA: Insufficient documentation

## 2020-12-26 DIAGNOSIS — S61512D Laceration without foreign body of left wrist, subsequent encounter: Secondary | ICD-10-CM | POA: Diagnosis not present

## 2020-12-26 DIAGNOSIS — I69398 Other sequelae of cerebral infarction: Secondary | ICD-10-CM | POA: Diagnosis not present

## 2020-12-26 DIAGNOSIS — G4733 Obstructive sleep apnea (adult) (pediatric): Secondary | ICD-10-CM | POA: Diagnosis not present

## 2020-12-26 DIAGNOSIS — N390 Urinary tract infection, site not specified: Secondary | ICD-10-CM

## 2020-12-26 DIAGNOSIS — I129 Hypertensive chronic kidney disease with stage 1 through stage 4 chronic kidney disease, or unspecified chronic kidney disease: Secondary | ICD-10-CM | POA: Diagnosis not present

## 2020-12-26 DIAGNOSIS — Z7984 Long term (current) use of oral hypoglycemic drugs: Secondary | ICD-10-CM | POA: Diagnosis not present

## 2020-12-26 DIAGNOSIS — D631 Anemia in chronic kidney disease: Secondary | ICD-10-CM | POA: Diagnosis not present

## 2020-12-26 DIAGNOSIS — E1122 Type 2 diabetes mellitus with diabetic chronic kidney disease: Secondary | ICD-10-CM | POA: Diagnosis not present

## 2020-12-26 DIAGNOSIS — R1310 Dysphagia, unspecified: Secondary | ICD-10-CM | POA: Diagnosis not present

## 2020-12-26 DIAGNOSIS — Z9181 History of falling: Secondary | ICD-10-CM | POA: Diagnosis not present

## 2020-12-26 DIAGNOSIS — I2510 Atherosclerotic heart disease of native coronary artery without angina pectoris: Secondary | ICD-10-CM | POA: Diagnosis not present

## 2020-12-26 LAB — CBC WITH DIFFERENTIAL/PLATELET
Basophils Absolute: 0 10*3/uL (ref 0.0–0.1)
Basophils Relative: 0.4 % (ref 0.0–3.0)
Eosinophils Absolute: 0.3 10*3/uL (ref 0.0–0.7)
Eosinophils Relative: 3.2 % (ref 0.0–5.0)
HCT: 32.4 % — ABNORMAL LOW (ref 39.0–52.0)
Hemoglobin: 10.5 g/dL — ABNORMAL LOW (ref 13.0–17.0)
Lymphocytes Relative: 12.1 % (ref 12.0–46.0)
Lymphs Abs: 1.2 10*3/uL (ref 0.7–4.0)
MCHC: 32.4 g/dL (ref 30.0–36.0)
MCV: 81.2 fl (ref 78.0–100.0)
Monocytes Absolute: 1 10*3/uL (ref 0.1–1.0)
Monocytes Relative: 10.7 % (ref 3.0–12.0)
Neutro Abs: 7.2 10*3/uL (ref 1.4–7.7)
Neutrophils Relative %: 73.6 % (ref 43.0–77.0)
Platelets: 174 10*3/uL (ref 150.0–400.0)
RBC: 3.99 Mil/uL — ABNORMAL LOW (ref 4.22–5.81)
RDW: 15.7 % — ABNORMAL HIGH (ref 11.5–15.5)
WBC: 9.8 10*3/uL (ref 4.0–10.5)

## 2020-12-26 LAB — BASIC METABOLIC PANEL
BUN: 32 mg/dL — ABNORMAL HIGH (ref 6–23)
CO2: 31 mEq/L (ref 19–32)
Calcium: 9.1 mg/dL (ref 8.4–10.5)
Chloride: 99 mEq/L (ref 96–112)
Creatinine, Ser: 1.61 mg/dL — ABNORMAL HIGH (ref 0.40–1.50)
GFR: 39.65 mL/min — ABNORMAL LOW (ref >60.00)
Glucose, Bld: 185 mg/dL — ABNORMAL HIGH (ref 70–99)
Potassium: 3.9 mEq/L (ref 3.5–5.1)
Sodium: 137 mEq/L (ref 135–145)

## 2020-12-26 LAB — HEPATIC FUNCTION PANEL
ALT: 15 U/L (ref 0–53)
AST: 16 U/L (ref 0–37)
Albumin: 3.6 g/dL (ref 3.5–5.2)
Alkaline Phosphatase: 92 U/L (ref 39–117)
Bilirubin, Direct: 0.2 mg/dL (ref 0.0–0.3)
Total Bilirubin: 0.6 mg/dL (ref 0.2–1.2)
Total Protein: 6.6 g/dL (ref 6.0–8.3)

## 2020-12-26 MED ORDER — CYANOCOBALAMIN 1000 MCG/ML IJ SOLN
1000.0000 ug | Freq: Once | INTRAMUSCULAR | Status: AC
  Administered 2020-12-26: 1000 ug via INTRAMUSCULAR

## 2020-12-26 NOTE — Patient Instructions (Addendum)
You had the B12 shot today  Please continue all other medications as before, and refills have been done if requested.  Please have the pharmacy call with any other refills you may need.  Please keep your appointments with your specialists as you may have planned  We will work on the letter today  Please go to the LAB at the blood drawing area for the tests to be done  You will be contacted by phone if any changes need to be made immediately.  Otherwise, you will receive a letter about your results with an explanation, but please check with MyChart first.  Please remember to sign up for MyChart if you have not done so, as this will be important to you in the future with finding out test results, communicating by private email, and scheduling acute appointments online when needed.

## 2020-12-26 NOTE — Progress Notes (Signed)
Patient ID: Frank [name], male   DOB: 1941/06/08, 80 y.o.   MRN: [id number]        Chief Complaint: follow up recent uti, post stroke, generalized weakness and difficutly ambulating       HPI:  Frank [name] is a 81 y.o. male pt of Dr Ronnald Ramp here with wife who is a bit at her wits end; pt has hx of recent stroke and post UTI, then bronchitis during his rehab such that he never did regain much of his stamina but was required to leave the rehab after 20 days anyway.  Now at home and she is having much difficulty attending to his needs, he is high risk of all due to general weakness, and indeed did fall, she cant ift him but son luckly was available to lift him, no apparent injury.  Wife states HH RN was ordered post rehab but not enough.  He currently has nursing 8a-4p mon-Friday and PT/OT scheduled to start early next wk. Pt is essentailly currently wheelchair bound, and unable to assist with even transfer from wheelchair to commode   Daughter trying to help today was in touch with insurance who told a letter from me today would help try to increase the hours covered.  Wife is wanting hours increased to 8a-4p x 7 days, as well as 11p -7am so she can rest.  After she gets here, she realizes she does not know where this letter would be specifically be sent in order to be approved somehow.  She is adamant it would go to the insurance rather than home health agency since her daughter told her this.  She also asks for repeat UA given the recent UTI.  Pt denies urinary symptoms such as dysuria.  Dementia overall stable symptomatically, and not assoc with behavioral changes such as hallucinations, paranoia, or agitation.       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, Ischemic optic   OSA (obstructive sleep apnea)    cpap   TBI (traumatic brain injury) [facility])    Past Surgical History:  Procedure Laterality Date   APPENDECTOMY     arm fracture Left 2011   with hardware   BURR HOLE Bilateral 08/04/2017   Procedure: Haskell Flirt;  Surgeon: [name], MD;  Location: [place];  Service: Neurosurgery;  Laterality: Bilateral;   COLONOSCOPY     CRANIOTOMY Left 12/01/2017   Procedure: Trudee Kuster Holes CRANIOTOMY HEMATOMA EVACUATION SUBDURAL;  Surgeon: [name], MD;  Location: [place];  Service: Neurosurgery;  Laterality: Left;   ESOPHAGOGASTRODUODENOSCOPY  2006   gastritis   IR PERCUTANEOUS ART THROMBECTOMY/INFUSION INTRACRANIAL INC DIAG ANGIO  09/09/2020   PACEMAKER IMPLANT N/A 05/14/2020   Procedure: PACEMAKER IMPLANT;  Surgeon: [name], MD;  Location: [place] [facility];  Service: Cardiovascular;  Laterality: N/A;  RADIOLOGY WITH ANESTHESIA N/A 09/09/2020   Procedure: IR WITH ANESTHESIA;  Surgeon: Radiologist, Medication, MD;  Location: [place];  Service: Radiology;  Laterality: N/A;   ROTATOR CUFF REPAIR     SEPTOPLASTY  1959   Deviated Septum   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 He smoked an average of 1.00 packs per day. He has never used smokeless tobacco. He reports current alcohol use of about 2.0 standard drinks of alcohol per week. He reports that he does not use drugs. family history includes Allergies in his father; Breast cancer in his mother; Coronary artery disease in his brother; Heart disease in his mother; Stroke in his father. Allergies  Allergen Reactions   Lisinopril Cough   Invokana [Canagliflozin] Other (See Comments)    Stopped by MD    Penicillins Other (See Comments)    Allergic as a child.  Patient does not remember reaction.  Has had cephalasporins without problems.   Sulfamethoxazole Other (See Comments)    Unknown reaction- from childhood   Current Outpatient Medications on File Prior to Visit  Medication Sig Dispense Refill   amLODipine (NORVASC) 10 MG tablet Take 1 tablet (10 mg total) by mouth daily.     aspirin 81 MG chewable tablet Chew 1 tablet (81 mg total) by mouth daily.     atorvastatin (LIPITOR) 10 MG tablet Take 1 tablet (10 mg total) by mouth daily.     benzonatate (TESSALON) 100 MG capsule Take 1 capsule (100 mg total) by mouth 3 (three) times daily. 20 capsule 0   cloNIDine (CATAPRES - DOSED IN MG/24 HR) 0.1 mg/24hr patch Place 1 patch (0.1 mg total) onto the skin once a week. (Patient taking differently: Place 0.1 mg onto the skin every Tuesday.) 12 patch 1   Dextromethorphan-guaiFENesin (ROBITUSSIN DM PO) Take 15 mLs by mouth See admin instructions. Qid x 7 days     Ferric Maltol (ACCRUFER) 30 MG CAPS Take 1 capsule by mouth in the morning and at bedtime. (Patient taking differently: Take 30 mg by mouth in the morning and at bedtime.) 180 capsule 1   FLUoxetine (PROZAC) 20 MG capsule TAKE 1 CAPSULE BY MOUTH  DAILY (Patient taking differently: Take 20 mg by mouth daily.) 90 capsule 1   fluticasone (FLONASE) 50 MCG/ACT nasal spray Place 1 spray into both nostrils daily.     glipiZIDE (GLUCOTROL XL) 10 MG 24 hr tablet TAKE 1 TABLET BY MOUTH  DAILY (Patient taking differently: Take 10 mg by mouth daily with breakfast.) 90 tablet 0   Glucagon (GVOKE HYPOPEN 2-PACK) 1 MG/0.2ML SOAJ Inject 1 Act into the skin daily as needed. (Patient taking differently: Inject 1 each into the skin as needed (low blood sugar).) 2 mL 5   guaiFENesin (ROBITUSSIN) 100 MG/5ML SOLN Take 5 mLs (100 mg total) by mouth every 4 (four) hours as needed for cough or to loosen phlegm. 236 mL 0   hydrocortisone cream 1 % Apply 1 application  topically 4 (four) times daily as needed for itching.     insulin glargine, 2 Unit Dial, (TOUJEO MAX SOLOSTAR) 300 UNIT/ML Solostar Pen Inject 10 Units into the skin daily. 6 mL 1   Insulin Pen Needle 32G X 6 MM MISC 1 Act by Does not apply route daily. 100 each 1   levETIRAcetam (KEPPRA) 500 MG tablet Take 500 mg by mouth 2 (two) times daily.     loratadine (CLARITIN) 10 MG  tablet Take 1 tablet (10 mg total) by mouth every evening.     OVER THE COUNTER MEDICATION Take 120 mLs by mouth in the morning and at bedtime. Med pass 2.0     OXYGEN Inhale 2-4 L into the lungs See admin instructions. To keep O2 greater than 90%     pantoprazole (PROTONIX) 40 MG tablet TAKE 1 TABLET BY MOUTH  DAILY (Patient taking differently: Take 40 mg by mouth daily.) 90 tablet 1   PRESCRIPTION MEDICATION CPAP- At bedtime     senna-docusate (SENOKOT-S) 8.6-50 MG tablet Take 1 tablet by mouth at bedtime as needed for mild constipation.     solifenacin (VESICARE) 10 MG tablet Take 1 tablet (10 mg total) by mouth daily. 90 tablet 1   ticagrelor (BRILINTA) 90 MG TABS tablet Take 0.5 tablets (45 mg total) by mouth 2 (two) times daily. 180 tablet 0   No current facility-administered medications on file prior to visit.        ROS:  All others reviewed and negative.  Objective        PE:  BP 110/60 (BP Location: Left Arm, Patient Position: Sitting, Cuff Size: Normal)   Pulse 80   Temp 98.7 F (37.1 C) (Oral)   Ht 5\' 9"  (1.753 m)   SpO2 94%   BMI 30.27 kg/m                 Constitutional: Pt appears in NAD               HENT: Head: NCAT.                Right Ear: External ear normal.                 Left Ear: External ear normal.                Eyes: . Pupils are equal, round, and reactive to light. Conjunctivae and EOM are normal               Nose: without d/c or deformity               Neck: Neck supple. Gross normal ROM               Cardiovascular: Normal rate and regular rhythm.                  Pulmonary/Chest: Effort normal and breath sounds without rales or wheezing.                Abd:  Soft, NT, ND, + BS, no organomegaly               Neurological: Pt is alert. At baseline orientation, motor grossly intact               Skin: Skin is warm. No rashes, no other new lesions, LE edema - none               Psychiatric: Pt behavior is normal without agitation   Micro: none  Cardiac tracings I have personally interpreted today:  none  Pertinent Radiological findings (summarize): none   Lab Results  Component Value Date   WBC 9.8 12/26/2020   HGB 10.5 (L) 12/26/2020   HCT 32.4 (L) 12/26/2020   PLT 174.0 12/26/2020   GLUCOSE 185 (H) 12/26/2020   CHOL 104 11/19/2020   TRIG 91 11/19/2020   HDL 33 (L) 11/19/2020   LDLCALC 53 11/19/2020  ALT 15 12/26/2020   AST 16 12/26/2020   NA 137 12/26/2020   K 3.9 12/26/2020   CL 99 12/26/2020   CREATININE 1.61 (H) 12/26/2020   BUN 32 (H) 12/26/2020   CO2 31 12/26/2020   TSH 0.459 11/18/2020   PSA 1.08 07/10/2015   INR 1.2 11/18/2020   HGBA1C 7.7 (H) 11/19/2020   MICROALBUR 67.2 04/23/2020   Assessment/Plan:  Ajmal Kathan is a 80 y.o. White or Caucasian [1] male with  has a past medical history of Allergy, Anemia, Arthritis, AV block, Mobitz 2 (05/14/2020), Diabetes mellitus, GERD (gastroesophageal reflux disease), Hyperlipidemia, Hypertension, Neuropathy (2001), OSA (obstructive sleep apnea), and TBI (traumatic brain injury) ([place]).  Generalized weakness Exam o/w benign for new finding, for urine studies as requested, but suspect as per wife he has post uti and bronchitis low stamina; ok for letter if she can let us know where to send; to start PT/OT early next wk; famly will need to support him till he can better assist in his care, but if unable, he should present to the ED for for further evaluation and consideration for skilled placement due to inability to support his needs in the home; also for cbc, bmp  Simple chronic  bronchitis ([name]) Has occas non prod cough now, but o/w appears improved,  to f/u any worsening symptoms or concerns  B12 deficiency anemia Also for b12 1000 mg im today as is close to being due, also with lab f/u today  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  Cathlean Cower, MD 12/29/2020 3:56 PM [facility]

## 2020-12-27 ENCOUNTER — Encounter: Payer: Self-pay | Admitting: Internal Medicine

## 2020-12-27 ENCOUNTER — Other Ambulatory Visit: Payer: Self-pay

## 2020-12-27 DIAGNOSIS — Z950 Presence of cardiac pacemaker: Secondary | ICD-10-CM | POA: Diagnosis not present

## 2020-12-27 DIAGNOSIS — R1310 Dysphagia, unspecified: Secondary | ICD-10-CM | POA: Diagnosis not present

## 2020-12-27 DIAGNOSIS — I69398 Other sequelae of cerebral infarction: Secondary | ICD-10-CM | POA: Diagnosis not present

## 2020-12-27 DIAGNOSIS — N1832 Chronic kidney disease, stage 3b: Secondary | ICD-10-CM | POA: Diagnosis not present

## 2020-12-27 DIAGNOSIS — E1122 Type 2 diabetes mellitus with diabetic chronic kidney disease: Secondary | ICD-10-CM | POA: Diagnosis not present

## 2020-12-27 DIAGNOSIS — E785 Hyperlipidemia, unspecified: Secondary | ICD-10-CM | POA: Diagnosis not present

## 2020-12-27 DIAGNOSIS — I129 Hypertensive chronic kidney disease with stage 1 through stage 4 chronic kidney disease, or unspecified chronic kidney disease: Secondary | ICD-10-CM | POA: Diagnosis not present

## 2020-12-27 DIAGNOSIS — M6281 Muscle weakness (generalized): Secondary | ICD-10-CM | POA: Diagnosis not present

## 2020-12-27 DIAGNOSIS — S61512D Laceration without foreign body of left wrist, subsequent encounter: Secondary | ICD-10-CM | POA: Diagnosis not present

## 2020-12-27 DIAGNOSIS — Z794 Long term (current) use of insulin: Secondary | ICD-10-CM | POA: Diagnosis not present

## 2020-12-27 DIAGNOSIS — Z7984 Long term (current) use of oral hypoglycemic drugs: Secondary | ICD-10-CM | POA: Diagnosis not present

## 2020-12-27 DIAGNOSIS — Z7982 Long term (current) use of aspirin: Secondary | ICD-10-CM | POA: Diagnosis not present

## 2020-12-27 DIAGNOSIS — D631 Anemia in chronic kidney disease: Secondary | ICD-10-CM | POA: Diagnosis not present

## 2020-12-27 DIAGNOSIS — Z87440 Personal history of urinary (tract) infections: Secondary | ICD-10-CM | POA: Diagnosis not present

## 2020-12-27 DIAGNOSIS — G4733 Obstructive sleep apnea (adult) (pediatric): Secondary | ICD-10-CM | POA: Diagnosis not present

## 2020-12-27 DIAGNOSIS — Z9181 History of falling: Secondary | ICD-10-CM | POA: Diagnosis not present

## 2020-12-27 DIAGNOSIS — I2510 Atherosclerotic heart disease of native coronary artery without angina pectoris: Secondary | ICD-10-CM | POA: Diagnosis not present

## 2020-12-27 LAB — URINALYSIS, ROUTINE W REFLEX MICROSCOPIC
Bilirubin Urine: NEGATIVE
Hgb urine dipstick: NEGATIVE
Ketones, ur: NEGATIVE
Nitrite: NEGATIVE
RBC / HPF: NONE SEEN (ref 0–?)
Specific Gravity, Urine: 1.02 (ref 1.000–1.030)
Total Protein, Urine: 30 — AB
Urine Glucose: NEGATIVE
Urobilinogen, UA: 0.2 (ref 0.0–1.0)
pH: 6 (ref 5.0–8.0)

## 2020-12-27 NOTE — Patient Outreach (Signed)
[facility]) Care Management  12/27/2020  Frank [name] 31-Dec-1940 [id number]   3 outreach attempts were completed to obtain mRs. mRs could not be obtained because patient never returned my calls. mRs=7    [place] Management Assistant [phone]

## 2020-12-29 ENCOUNTER — Encounter: Payer: Self-pay | Admitting: Internal Medicine

## 2020-12-29 ENCOUNTER — Other Ambulatory Visit: Payer: Self-pay | Admitting: Internal Medicine

## 2020-12-29 DIAGNOSIS — E1142 Type 2 diabetes mellitus with diabetic polyneuropathy: Secondary | ICD-10-CM

## 2020-12-29 LAB — URINE CULTURE: Result:: NO GROWTH

## 2020-12-29 NOTE — Assessment & Plan Note (Addendum)
Exam o/w benign for new finding, for urine studies as requested, but suspect as per wife he has post uti and bronchitis low stamina; ok for letter if she can let us know where to send; to start PT/OT early next wk; famly will need to support him till he can better assist in his care, but if unable, he should present to the ED for for further evaluation and consideration for skilled placement due to inability to support his needs in the home; also for cbc, bmp

## 2020-12-29 NOTE — Assessment & Plan Note (Addendum)
Also for b12 1000 mg im today as is close to being due, also with lab f/u today

## 2020-12-29 NOTE — Assessment & Plan Note (Signed)
O/w stable behaviorally,  to f/u any worsening symptoms or concerns

## 2020-12-29 NOTE — Assessment & Plan Note (Signed)
Has occas non prod cough now, but o/w appears improved,  to f/u any worsening symptoms or concerns

## 2020-12-30 ENCOUNTER — Telehealth (HOSPITAL_COMMUNITY): Payer: Self-pay

## 2020-12-30 ENCOUNTER — Telehealth: Payer: Self-pay

## 2020-12-30 ENCOUNTER — Other Ambulatory Visit: Payer: Medicare Other | Admitting: Nurse Practitioner

## 2020-12-30 ENCOUNTER — Other Ambulatory Visit: Payer: Self-pay

## 2020-12-30 VITALS — BP 120/58 | HR 66

## 2020-12-30 DIAGNOSIS — G4730 Sleep apnea, unspecified: Secondary | ICD-10-CM | POA: Diagnosis not present

## 2020-12-30 DIAGNOSIS — I959 Hypotension, unspecified: Secondary | ICD-10-CM

## 2020-12-30 DIAGNOSIS — Z515 Encounter for palliative care: Secondary | ICD-10-CM | POA: Diagnosis not present

## 2020-12-30 DIAGNOSIS — R531 Weakness: Secondary | ICD-10-CM

## 2020-12-30 NOTE — Progress Notes (Addendum)
[facility] Consult Note Telephone: [phone]  Fax: [phone]    Date of encounter: 12/30/20 PATIENT NAME: Frank [name] [address] [place] [address]   [phone] (home)  DOB: 11-04-1940 MRN: [id number]  PRIMARY CARE PROVIDER:    Janith Lima, MD,  [place] [address] [phone]  REFERRING PROVIDER:   Janith Lima, MD [address] [place],  [place] [address] [phone]  RESPONSIBLE PARTY:    Contact Information     Name Relation Home Work Mobile   [place] Spouse [phone]  [phone]   Frank [name], [place]   [phone]   Frank [name] Daughter   [phone]     I met face to face with patient in home. Patient wife and his daughter Frank [name] present during visit. Palliative Care was asked to follow this patient by consultation request of  Frank Lima, MD to address advance care planning and complex medical decision making. This is an initial home visit.        ASSESSMENT AND PLAN / RECOMMENDATIONS:   Advance Care Planning/Goals of Care: Goals include to maximize quality of life and symptom management. Our advance care planning conversation included a discussion about:    The value and importance of advance care planning  Exploration of goals of care in the event of a sudden injury or illness  Review and updating or creation of an  advance directive document . CODE STATUS: DNR Goal of care: Patient's goal of care is function while preserving function. Patient verbalized desire to regain enough strength to be able to be completely independent in his ADLs and return to doing things he enjoys doing. Directives: Signed DNR and MOST forms present in home, copies on Frank [name] EMR. Details of MOST form include limited additional intervention,antibiotics if indicated, IV fluids if indicated, no feeding tube. Reviewed MOST form elections today, no changes made.  Patient reiterated desire to not be resuscitated in the event of cardiac or respiratory arrest.  I spent 25 minutes providing this consultation. More than 50% of the time in this consultation was spent in counseling and care coordination. -------------------------------------------------------------------------------  Symptom Management/Plan: Generalized weakness: continue home PT/OT as planned. Maintain safety, discussed fall prevention strategies. Hypotension: Family report that Neurologist during patient's hospitalization want patient's blood pressure to run low, patient yet to be seen by Dr. Loretta Plume outpatient, appointment scheduled for next week.on 01/06/2021. Patient advised to take Amlodipine at bedtime. Advised to keep log of blood pressures before taking morning medications, advised to take log to Neuro appointment. Patient advised to call me in the next week if systolic blood blood pressure  consistently < 100. We may consider decreasing dose of Amlodipine pending Neurology appointment.   Sleep apnea: denied insomnia. Continue CPAP with 3L at bedtime. Provided general support and encouragement. Questions and concerns were addressed. Patient and family was encouraged to call with questions and/or concerns.  Follow up Palliative Care Visit: Palliative care will continue to follow for complex medical decision making, advance care planning, and clarification of goals. Return in about 4 weeks or prn.  PPS: 50%  HOSPICE ELIGIBILITY/DIAGNOSIS: TBD  Chief Complaint: low blood pressures  History obtained from review of Epic EMR and discussion with [name] and his family.  HISTORY OF PRESENT ILLNESS:  [name] is a 80 y.o. 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 He continues to require maximum assist with his ADLs, he is requires assistance with transfers. He is able to feed self and manage his medication. Patient is pending starting PT/OT. Family report patient having episodes of low blood pressures wit SBP range 84-131, average of 108, no report of bradycardia. Patient on Amlodipine 66m and Clonidine patch. Patient denied lightheadedness but report feeling of low energy. Now mostly uses wheelchair for ambulation. Had one fall since discharge from rehab stay. Type 2 diabetes is stable, A1c 7.7 on 11/19/2020.   I reviewed available labs, medications, imaging, studies and related documents from the EMR.  Records reviewed and summarized above.   ROS General: NAD, denied fever, denied chills EYES: denies acute vision changes ENMT: denies dysphagia Cardiovascular: denies chest pain, denies DOE Pulmonary: denies cough, denies increased SOB Abdomen: endorses poor appetite, denies constipation, endorses continence of bowel GU: denies dysuria, endorses continence of urine MSK: endorsed weakness Skin: denies rashes, endorsed skin tear to left hand and left lower leg Neurological: denies pain, denies insomnia Psych: Endorses positive mood Heme/lymph/immuno: denies bruises, abnormal bleeding  Vitals:   12/30/20 1246  BP: (!) 120/58  Pulse: 66  SpO2: 97%     Physical Exam: Current and past weights: 210lbs, Ht 5 10". BMI 30.1 Constitutional: NAD General: chronically ill and frail appearing, sitting in wheelchair at his dinning area actively participating in visit discussion.  EYES: anicteric sclera, no discharge  ENMT: intact hearing, oral mucous membranes moist CV: S1S2 normal, no LE edema Pulmonary: LCTA, no increased work of breathing, no cough, room air Abdomen: no ascites GU: deferred MSK: sarcopenia, moves all extremities Skin: warm and dry, dressing on wound tear to  left hand and left lower leg intact. 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 ([facility] 11/28/2019   Chronic renal disease, stage 3, moderately decreased glomerular filtration rate (GFR) between 30-59 mL/min/1.73 square meter ([facility] 11/21/2018   GERD without esophagitis 07/22/2017   Depression    Type 2 diabetes mellitus with peripheral neuropathy ([facility]    OAB (overactive bladder) 11/26/2014   Routine general medical examination at a health care facility 04/06/2014   Vitamin D deficiency 07/23/2010   Obstructive sleep apnea 09/13/2008   Hyperlipidemia with target LDL less than 100 07/27/2007   BPH (benign prostatic hyperplasia) 06/21/2007   OPTIC NEUROPATHY, 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   B12 deficiency anemia 04/15/2006   Essential hypertension 04/15/2006   Allergic rhinitis 04/15/2006   PAST MEDICAL HISTORY:  Active Ambulatory Problems    Diagnosis Date Noted   Hyperlipidemia with target LDL less than 100 07/27/2007   B12 deficiency anemia 04/15/2006   Obstructive sleep apnea 09/13/2008   OPTIC NEUROPATHY, 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   Essential hypertension 04/15/2006   Allergic rhinitis 04/15/2006   BPH (benign prostatic hyperplasia)  06/21/2007   Vitamin D deficiency 07/23/2010   Routine general medical examination at a health care facility 04/06/2014   OAB (overactive bladder) 11/26/2014   Depression     Type 2 diabetes mellitus with peripheral neuropathy ([place])    GERD without esophagitis 07/22/2017   Chronic renal disease, stage 3, moderately decreased glomerular filtration rate (GFR) between 30-59 mL/min/1.73 square meter ([place]) 11/21/2018   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 ([place]) 11/28/2019   AV block, 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   Stroke (cerebrum) ([place]) 09/09/2020   Middle cerebral artery embolism, 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   DNR (do not resuscitate) discussion 10/17/2020   Iron deficiency anemia secondary to inadequate dietary iron intake 11/06/2020   Simple chronic bronchitis ([place]) 11/13/2020   Generalized weakness 12/26/2020   Resolved Ambulatory Problems    Diagnosis Date Noted   Type II diabetes mellitus with manifestations ([place]) 04/15/2006   Depression 12/25/2009   Ataxia 05/17/2014   Onychomycosis of right great toe 07/10/2015   Cough 10/31/2015   Otitis media 10/31/2015   Mass of left hand 12/11/2015   Asthma with acute exacerbation 03/18/2016   Acute bronchitis 03/18/2016   Deficiency anemia 06/16/2016   Morbid obesity ([place]) 05/12/2017   Intracranial hematoma following injury ([place]) 06/23/2017   ICH (intracerebral hemorrhage) ([place]) 06/23/2017   H/O clavicle fracture    Benign essential HTN    Hyperlipidemia    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   Acute blood loss anemia    Stage 2 chronic kidney disease    Hyponatremia    Stage 3 chronic kidney disease ([place])    Need for shingles vaccine 07/20/2017   Subdural hematoma ([name]) 08/04/2017   SDH (subdural hematoma) ([place]) 08/05/2017   Arthritis of left hand 12/26/2015   Subdural hematoma without coma ([place]) 11/30/2017   SDH (subdural hematoma) ([place]) 12/01/2017   S/P craniotomy 12/01/2017   RTI (respiratory tract infection) 11/21/2018   Cough 11/21/2018   Thrombocytopenia ([place]) 11/30/2018   Deficiency anemia 04/20/2019   Long toenail 04/20/2019   Need for Tdap  vaccination 11/28/2019   Tick bite of abdomen 12/18/2019   Contusion of scalp 02/01/2020   Cellulitis of back except buttock 05/09/2020   Wound infection, posttraumatic 05/09/2020   Aspiration pneumonia ([place]) 10/17/2020   Deficiency anemia 11/05/2020   Past Medical History:  Diagnosis Date   Allergy    Anemia    Arthritis    Diabetes mellitus    GERD (gastroesophageal reflux disease)    Hypertension    Neuropathy 2001   OSA (obstructive sleep apnea)    TBI (traumatic brain injury) ([place])    SOCIAL HX:  Social History   Tobacco Use   Smoking status: Former    Packs/day: 1.00    Pack years: 0.00    Types: Cigarettes    Quit date: 07/14/1983    Years since quitting: 37.4   Smokeless tobacco: Never  Substance Use Topics   Alcohol use: Yes    Alcohol/week: 2.0 standard drinks    Types: 2 Standard drinks or equivalent per week    Comment: occassionally   FAMILY HX:  Family History  Problem Relation Age of Onset   Heart disease Mother    Breast cancer Mother    Stroke Father    Allergies Father        Father and children   Coronary artery disease Brother    Diabetes Neg Hx    Colon cancer Neg Hx      ALLERGIES:  Allergies  Allergen Reactions   Lisinopril Cough   Invokana [Canagliflozin] Other (See Comments)    Stopped by MD   Penicillins Other (See Comments)    Allergic as a child.  Patient does not remember reaction.  Has had cephalasporins without problems.   Sulfamethoxazole Other (See Comments)    Unknown reaction- from childhood     PERTINENT MEDICATIONS:  Outpatient Encounter Medications as of 12/30/2020  Medication Sig   amLODipine (NORVASC) 10 MG tablet Take 1 tablet (10 mg total) by mouth daily.   aspirin 81 MG chewable tablet Chew 1 tablet (81 mg total) by mouth daily.   atorvastatin (LIPITOR) 10 MG tablet Take 1 tablet (10 mg total) by mouth daily.   benzonatate (TESSALON) 100 MG capsule Take 1 capsule (100 mg total) by mouth 3 (three) times daily.    cloNIDine (CATAPRES - DOSED IN MG/24 HR) 0.1 mg/24hr patch Place 1 patch (0.1 mg total) onto the skin once a week. (Patient taking differently: Place 0.1 mg onto the skin every Tuesday.)   Dextromethorphan-guaiFENesin (ROBITUSSIN DM PO) Take 15 mLs by mouth See admin instructions. Qid x 7 days   Ferric Maltol (ACCRUFER) 30 MG CAPS Take 1 capsule by mouth in the morning and at bedtime. (Patient taking differently: Take 30 mg by mouth in the morning and at bedtime.)   FLUoxetine (PROZAC) 20 MG capsule TAKE 1 CAPSULE BY MOUTH  DAILY (Patient taking differently: Take 20 mg by mouth daily.)   fluticasone (FLONASE) 50 MCG/ACT nasal spray Place 1 spray into both nostrils daily.   glipiZIDE (GLUCOTROL XL) 10 MG 24 hr tablet TAKE 1 TABLET BY MOUTH  DAILY (Patient taking differently: Take 10 mg by mouth daily with breakfast.)   Glucagon (GVOKE HYPOPEN 2-PACK) 1 MG/0.2ML SOAJ Inject 1 Act into the skin daily as needed. (Patient taking differently: Inject 1 each into the skin as needed (low blood sugar).)   guaiFENesin (ROBITUSSIN) 100 MG/5ML SOLN Take 5 mLs (100 mg total) by mouth every 4 (four) hours as needed for cough or to loosen phlegm.   hydrocortisone cream 1 % Apply 1 application topically 4 (four) times daily as needed for itching.   Insulin Pen Needle 32G X 6 MM MISC 1 Act by Does not apply route daily.   levETIRAcetam (KEPPRA) 500 MG tablet Take 500 mg by mouth 2 (two) times daily.   loratadine (CLARITIN) 10 MG tablet Take 1 tablet (10 mg total) by mouth every evening.   OVER THE COUNTER MEDICATION Take 120 mLs by mouth in the morning and at bedtime. Med pass 2.0   OXYGEN Inhale 2-4 L into the lungs See admin instructions. To keep O2 greater than 90%   pantoprazole (PROTONIX) 40 MG tablet TAKE 1 TABLET BY MOUTH  DAILY (Patient taking differently: Take 40 mg by mouth daily.)   PRESCRIPTION MEDICATION CPAP- At bedtime   senna-docusate (SENOKOT-S) 8.6-50 MG tablet Take 1 tablet by mouth at bedtime as  needed for mild constipation.   solifenacin (VESICARE) 10 MG tablet Take 1 tablet (10 mg total) by mouth daily.   ticagrelor (BRILINTA) 90 MG TABS tablet Take 0.5 tablets (45 mg total) by mouth 2 (two) times daily.   TOUJEO MAX SOLOSTAR 300 UNIT/ML Solostar Pen INJECT SUBCUTANEOUSLY 10  UNITS DAILY   No facility-administered encounter medications on file as of 12/30/2020.   Thank you for the opportunity to participate in the care of Mr. Knight.  The palliative care team will continue to follow. Please call our  office at [phone] if we can be of additional assistance.   Jari Favre, DNP, AGPCNP-BC  COVID-19 PATIENT SCREENING TOOL Asked and negative response unless otherwise noted:   Have you had symptoms of covid, tested positive or been in contact with someone with symptoms/positive test in the past 5-10 days?

## 2020-12-30 NOTE — Telephone Encounter (Signed)
Attempted to contact patient's wife Linna Caprice to schedule a Palliative Care consult appointment in the home. Patient transferred from [facility] to home. No answer left a message to return call.

## 2020-12-30 NOTE — Telephone Encounter (Signed)
Called to schedule consult, no answer. AW

## 2020-12-31 ENCOUNTER — Other Ambulatory Visit: Payer: Self-pay | Admitting: Internal Medicine

## 2020-12-31 DIAGNOSIS — E1142 Type 2 diabetes mellitus with diabetic polyneuropathy: Secondary | ICD-10-CM

## 2020-12-31 DIAGNOSIS — E113293 Type 2 diabetes mellitus with mild nonproliferative diabetic retinopathy without macular edema, bilateral: Secondary | ICD-10-CM | POA: Diagnosis not present

## 2020-12-31 DIAGNOSIS — H524 Presbyopia: Secondary | ICD-10-CM | POA: Diagnosis not present

## 2020-12-31 DIAGNOSIS — E119 Type 2 diabetes mellitus without complications: Secondary | ICD-10-CM

## 2020-12-31 LAB — HM DIABETES EYE EXAM

## 2021-01-01 DIAGNOSIS — I69398 Other sequelae of cerebral infarction: Secondary | ICD-10-CM | POA: Diagnosis not present

## 2021-01-01 DIAGNOSIS — E1122 Type 2 diabetes mellitus with diabetic chronic kidney disease: Secondary | ICD-10-CM | POA: Diagnosis not present

## 2021-01-01 DIAGNOSIS — Z950 Presence of cardiac pacemaker: Secondary | ICD-10-CM | POA: Diagnosis not present

## 2021-01-01 DIAGNOSIS — Z7984 Long term (current) use of oral hypoglycemic drugs: Secondary | ICD-10-CM | POA: Diagnosis not present

## 2021-01-01 DIAGNOSIS — G4733 Obstructive sleep apnea (adult) (pediatric): Secondary | ICD-10-CM | POA: Diagnosis not present

## 2021-01-01 DIAGNOSIS — S61512D Laceration without foreign body of left wrist, subsequent encounter: Secondary | ICD-10-CM | POA: Diagnosis not present

## 2021-01-01 DIAGNOSIS — N1832 Chronic kidney disease, stage 3b: Secondary | ICD-10-CM | POA: Diagnosis not present

## 2021-01-01 DIAGNOSIS — E785 Hyperlipidemia, unspecified: Secondary | ICD-10-CM | POA: Diagnosis not present

## 2021-01-01 DIAGNOSIS — I2510 Atherosclerotic heart disease of native coronary artery without angina pectoris: Secondary | ICD-10-CM | POA: Diagnosis not present

## 2021-01-01 DIAGNOSIS — I129 Hypertensive chronic kidney disease with stage 1 through stage 4 chronic kidney disease, or unspecified chronic kidney disease: Secondary | ICD-10-CM | POA: Diagnosis not present

## 2021-01-01 DIAGNOSIS — Z794 Long term (current) use of insulin: Secondary | ICD-10-CM | POA: Diagnosis not present

## 2021-01-01 DIAGNOSIS — Z7982 Long term (current) use of aspirin: Secondary | ICD-10-CM | POA: Diagnosis not present

## 2021-01-01 DIAGNOSIS — Z9181 History of falling: Secondary | ICD-10-CM | POA: Diagnosis not present

## 2021-01-01 DIAGNOSIS — Z87440 Personal history of urinary (tract) infections: Secondary | ICD-10-CM | POA: Diagnosis not present

## 2021-01-01 DIAGNOSIS — M6281 Muscle weakness (generalized): Secondary | ICD-10-CM | POA: Diagnosis not present

## 2021-01-01 DIAGNOSIS — D631 Anemia in chronic kidney disease: Secondary | ICD-10-CM | POA: Diagnosis not present

## 2021-01-01 DIAGNOSIS — R1310 Dysphagia, unspecified: Secondary | ICD-10-CM | POA: Diagnosis not present

## 2021-01-02 ENCOUNTER — Telehealth: Payer: Self-pay | Admitting: Pharmacist

## 2021-01-02 DIAGNOSIS — R1310 Dysphagia, unspecified: Secondary | ICD-10-CM | POA: Diagnosis not present

## 2021-01-02 DIAGNOSIS — N1832 Chronic kidney disease, stage 3b: Secondary | ICD-10-CM | POA: Diagnosis not present

## 2021-01-02 DIAGNOSIS — Z7982 Long term (current) use of aspirin: Secondary | ICD-10-CM | POA: Diagnosis not present

## 2021-01-02 DIAGNOSIS — S61512D Laceration without foreign body of left wrist, subsequent encounter: Secondary | ICD-10-CM | POA: Diagnosis not present

## 2021-01-02 DIAGNOSIS — I129 Hypertensive chronic kidney disease with stage 1 through stage 4 chronic kidney disease, or unspecified chronic kidney disease: Secondary | ICD-10-CM | POA: Diagnosis not present

## 2021-01-02 DIAGNOSIS — M6281 Muscle weakness (generalized): Secondary | ICD-10-CM | POA: Diagnosis not present

## 2021-01-02 DIAGNOSIS — D631 Anemia in chronic kidney disease: Secondary | ICD-10-CM | POA: Diagnosis not present

## 2021-01-02 DIAGNOSIS — Z950 Presence of cardiac pacemaker: Secondary | ICD-10-CM | POA: Diagnosis not present

## 2021-01-02 DIAGNOSIS — G4733 Obstructive sleep apnea (adult) (pediatric): Secondary | ICD-10-CM | POA: Diagnosis not present

## 2021-01-02 DIAGNOSIS — I2510 Atherosclerotic heart disease of native coronary artery without angina pectoris: Secondary | ICD-10-CM | POA: Diagnosis not present

## 2021-01-02 DIAGNOSIS — E785 Hyperlipidemia, unspecified: Secondary | ICD-10-CM | POA: Diagnosis not present

## 2021-01-02 DIAGNOSIS — Z87440 Personal history of urinary (tract) infections: Secondary | ICD-10-CM | POA: Diagnosis not present

## 2021-01-02 DIAGNOSIS — Z7984 Long term (current) use of oral hypoglycemic drugs: Secondary | ICD-10-CM | POA: Diagnosis not present

## 2021-01-02 DIAGNOSIS — E1122 Type 2 diabetes mellitus with diabetic chronic kidney disease: Secondary | ICD-10-CM | POA: Diagnosis not present

## 2021-01-02 DIAGNOSIS — Z9181 History of falling: Secondary | ICD-10-CM | POA: Diagnosis not present

## 2021-01-02 DIAGNOSIS — I69398 Other sequelae of cerebral infarction: Secondary | ICD-10-CM | POA: Diagnosis not present

## 2021-01-02 DIAGNOSIS — Z794 Long term (current) use of insulin: Secondary | ICD-10-CM | POA: Diagnosis not present

## 2021-01-02 NOTE — Progress Notes (Signed)
Chronic Care Management Pharmacy Assistant   Name: Frank [name]  MRN: [id number] DOB: [birth year]   Reason for Encounter: Disease State   Conditions to be addressed/monitored: HTN, Diabetic Call   Recent office visits:  11/05/20 Dr. Ronnald Ramp Internal Medicine ordered Ferric Maltol 30 mg discontinued cyancobalamin, doxiazosin, and temisartan 11/13/20 Dr. Ronnald Ramp ordered Hydrocodone Bit-Homatrop Mbr 5-1.5 mg/30ml, clonidine 0.1 mg wk, discontinued benzonatate  Recent consult visits:  None ID  Hospital visits:  Medication Reconciliation was completed by comparing discharge summary, patient's EMR and Pharmacy list, and upon discussion with patient.  Admitted to the hospital on 11/05/20 due to fatigue. Discharge date was 11/06/20. Discharged from [facility].    11/18/20 due to stroke discharged 11/25/20 from [facility]  12/14/20 due to fall discharged 12/14/20 from [facility]?Medications Started at [facility] Discharge:?? -started benzonatate  Medication Changes at Hospital Discharge: -Changed None ID  Medications Discontinued at Hospital Discharge: -Stopped None ID  Medications that remain the same after Hospital Discharge:??  -All other medications will remain the same.    Medications: Outpatient Encounter Medications as of 01/02/2021  Medication Sig   amLODipine (NORVASC) 10 MG tablet Take 1 tablet (10 mg total) by mouth daily.   aspirin 81 MG chewable tablet Chew 1 tablet (81 mg total) by mouth daily.   atorvastatin (LIPITOR) 10 MG tablet Take 1 tablet (10 mg total) by mouth daily.   benzonatate (TESSALON) 100 MG capsule Take 1 capsule (100 mg total) by mouth 3 (three) times daily.   cloNIDine (CATAPRES - DOSED IN MG/24 HR) 0.1 mg/24hr patch Place 1 patch (0.1 mg total) onto the skin once a week. (Patient taking differently: Place 0.1 mg onto the skin every Tuesday.)   Dextromethorphan-guaiFENesin (ROBITUSSIN DM PO) Take 15 mLs by mouth See admin  instructions. Qid x 7 days   Ferric Maltol (ACCRUFER) 30 MG CAPS Take 1 capsule by mouth in the morning and at bedtime. (Patient taking differently: Take 30 mg by mouth in the morning and at bedtime.)   FLUoxetine (PROZAC) 20 MG capsule TAKE 1 CAPSULE BY MOUTH  DAILY (Patient taking differently: Take 20 mg by mouth daily.)   fluticasone (FLONASE) 50 MCG/ACT nasal spray Place 1 spray into both nostrils daily.   glipiZIDE (GLUCOTROL XL) 10 MG 24 hr tablet TAKE 1 TABLET BY MOUTH  DAILY   Glucagon (GVOKE HYPOPEN 2-PACK) 1 MG/0.2ML SOAJ Inject 1 Act into the skin daily as needed. (Patient taking differently: Inject 1 each into the skin as needed (low blood sugar).)   guaiFENesin (ROBITUSSIN) 100 MG/5ML SOLN Take 5 mLs (100 mg total) by mouth every 4 (four) hours as needed for cough or to loosen phlegm.   hydrocortisone cream 1 % Apply 1 application topically 4 (four) times daily as needed for itching.   Insulin Pen Needle 32G X 6 MM MISC 1 Act by Does not apply route daily.   levETIRAcetam (KEPPRA) 500 MG tablet Take 500 mg by mouth 2 (two) times daily.   loratadine (CLARITIN) 10 MG tablet Take 1 tablet (10 mg total) by mouth every evening.   OVER THE COUNTER MEDICATION Take 120 mLs by mouth in the morning and at bedtime. Med pass 2.0   OXYGEN Inhale 2-4 L into the lungs See admin instructions. To keep O2 greater than 90%   pantoprazole (PROTONIX) 40 MG tablet TAKE 1 TABLET BY MOUTH  DAILY (Patient taking differently: Take 40 mg by mouth daily.)   PRESCRIPTION MEDICATION  CPAP- At bedtime   senna-docusate (SENOKOT-S) 8.6-50 MG tablet Take 1 tablet by mouth at bedtime as needed for mild constipation.   solifenacin (VESICARE) 10 MG tablet Take 1 tablet (10 mg total) by mouth daily.   ticagrelor (BRILINTA) 90 MG TABS tablet Take 0.5 tablets (45 mg total) by mouth 2 (two) times daily.   TOUJEO MAX SOLOSTAR 300 UNIT/ML Solostar Pen INJECT SUBCUTANEOUSLY 10  UNITS DAILY   No facility-administered encounter  medications on file as of 01/02/2021.    Pharmacist Review Reviewed chart prior to disease state call. Spoke with patient regarding BP  Recent Office Vitals: BP Readings from Last 3 Encounters:  12/30/20 (!) 120/58  12/26/20 110/60  12/14/20 (!) 141/71   Pulse Readings from Last 3 Encounters:  12/30/20 66  12/26/20 80  12/14/20 72    Wt Readings from Last 3 Encounters:  11/18/20 205 lb (93 kg)  11/13/20 204 lb (92.5 kg)  09/20/20 205 lb 0.4 oz (93 kg)     Kidney Function Lab Results  Component Value Date/Time   CREATININE 1.61 (H) 12/26/2020 04:00 PM   CREATININE 1.57 (H) 11/24/2020 08:15 AM   GFR 39.65 (L) 12/26/2020 04:00 PM   GFRNONAA 44 (L) 11/24/2020 08:15 AM   GFRAA 30 (L) 12/08/2019 10:50 PM    BMP Latest Ref Rng & Units 12/26/2020 11/24/2020 11/21/2020  Glucose 70 - 99 mg/dL 185(H) 164(H) 128(H)  BUN 6 - 23 mg/dL 32(H) 25(H) 24(H)  Creatinine 0.40 - 1.50 mg/dL 1.61(H) 1.57(H) 1.54(H)  Sodium 135 - 145 mEq/L 137 135 137  Potassium 3.5 - 5.1 mEq/L 3.9 4.1 3.7  Chloride 96 - 112 mEq/L 99 99 101  CO2 19 - 32 mEq/L 31 30 27   Calcium 8.4 - 10.5 mg/dL 9.1 8.8(L) 8.9    Current antihypertensive regimen:  Amlodipine 10 mg 1 tab daily Clonidine 0.1 mg patch weekly  How often are you checking your Blood Pressure? daily Current home BP readings: Patient blood pressure this morning was 106/56  What recent interventions/DTPs have been made by any provider to improve Blood Pressure control since last CPP Visit: Dr. Ronnald Ramp lowered the dose on clonidine patch to 0.1 mg  Any recent hospitalizations or ED visits since last visit with CPP? Yes  What diet changes have been made to improve Blood Pressure Control?  Patient wife states that there has not been any changes to diet What exercise is being done to improve your Blood Pressure Control?  Wife states that the patient is unable to exercise  Adherence Review: Is the patient currently on ACE/ARB medication? No Does the  patient have >5 day gap between last estimated fill dates? No   Star Rating Drugs: Atorvastatin 09/25/20 90 ds   Ethelene Hal Clinical Pharmacist Assistant [phone]

## 2021-01-02 NOTE — Progress Notes (Signed)
Chronic Care Management Pharmacy Assistant   Name: Frank [name]  MRN: [id number] DOB: Jan 25, 1941   Reason for Encounter: Disease State   Conditions to be addressed/monitored: DMII    Medications: Outpatient Encounter Medications as of 01/02/2021  Medication Sig   amLODipine (NORVASC) 10 MG tablet Take 1 tablet (10 mg total) by mouth daily.   aspirin 81 MG chewable tablet Chew 1 tablet (81 mg total) by mouth daily.   atorvastatin (LIPITOR) 10 MG tablet Take 1 tablet (10 mg total) by mouth daily.   benzonatate (TESSALON) 100 MG capsule Take 1 capsule (100 mg total) by mouth 3 (three) times daily.   cloNIDine (CATAPRES - DOSED IN MG/24 HR) 0.1 mg/24hr patch Place 1 patch (0.1 mg total) onto the skin once a week. (Patient taking differently: Place 0.1 mg onto the skin every Tuesday.)   Dextromethorphan-guaiFENesin (ROBITUSSIN DM PO) Take 15 mLs by mouth See admin instructions. Qid x 7 days   Ferric Maltol (ACCRUFER) 30 MG CAPS Take 1 capsule by mouth in the morning and at bedtime. (Patient taking differently: Take 30 mg by mouth in the morning and at bedtime.)   FLUoxetine (PROZAC) 20 MG capsule TAKE 1 CAPSULE BY MOUTH  DAILY (Patient taking differently: Take 20 mg by mouth daily.)   fluticasone (FLONASE) 50 MCG/ACT nasal spray Place 1 spray into both nostrils daily.   glipiZIDE (GLUCOTROL XL) 10 MG 24 hr tablet TAKE 1 TABLET BY MOUTH  DAILY   Glucagon (GVOKE HYPOPEN 2-PACK) 1 MG/0.2ML SOAJ Inject 1 Act into the skin daily as needed. (Patient taking differently: Inject 1 each into the skin as needed (low blood sugar).)   guaiFENesin (ROBITUSSIN) 100 MG/5ML SOLN Take 5 mLs (100 mg total) by mouth every 4 (four) hours as needed for cough or to loosen phlegm.   hydrocortisone cream 1 % Apply 1 application topically 4 (four) times daily as needed for itching.   Insulin Pen Needle 32G X 6 MM MISC 1 Act by Does not apply route daily.   levETIRAcetam (KEPPRA) 500 MG tablet Take 500 mg  by mouth 2 (two) times daily.   loratadine (CLARITIN) 10 MG tablet Take 1 tablet (10 mg total) by mouth every evening.   OVER THE COUNTER MEDICATION Take 120 mLs by mouth in the morning and at bedtime. Med pass 2.0   OXYGEN Inhale 2-4 L into the lungs See admin instructions. To keep O2 greater than 90%   pantoprazole (PROTONIX) 40 MG tablet TAKE 1 TABLET BY MOUTH  DAILY (Patient taking differently: Take 40 mg by mouth daily.)   PRESCRIPTION MEDICATION CPAP- At bedtime   senna-docusate (SENOKOT-S) 8.6-50 MG tablet Take 1 tablet by mouth at bedtime as needed for mild constipation.   solifenacin (VESICARE) 10 MG tablet Take 1 tablet (10 mg total) by mouth daily.   ticagrelor (BRILINTA) 90 MG TABS tablet Take 0.5 tablets (45 mg total) by mouth 2 (two) times daily.   TOUJEO MAX SOLOSTAR 300 UNIT/ML Solostar Pen INJECT SUBCUTANEOUSLY 10  UNITS DAILY   No facility-administered encounter medications on file as of 01/02/2021.    Pharmacist Review Recent Relevant Labs: Lab Results  Component Value Date/Time   HGBA1C 7.7 (H) 11/19/2020 03:24 AM   HGBA1C 7.7 (H) 11/18/2020 09:02 AM   MICROALBUR 67.2 04/23/2020 12:00 AM   MICROALBUR 10.9 (H) 11/27/2019 10:38 AM    Kidney Function Lab Results  Component Value Date/Time   CREATININE 1.61 (H) 12/26/2020 04:00 PM   CREATININE 1.57 (H)  11/24/2020 08:15 AM   GFR 39.65 (L) 12/26/2020 04:00 PM   GFRNONAA 44 (L) 11/24/2020 08:15 AM   GFRAA 30 (L) 12/08/2019 10:50 PM    Current antihyperglycemic regimen:  Toujeo  300 unit/ml  What recent interventions/DTPs have been made to improve glycemic control:  None ID  Have there been any recent hospitalizations or ED visits since last visit with CPP? Yes Patient denies hypoglycemic symptoms, including None Patient denies hyperglycemic symptoms, including fatigue How often are you checking your blood sugar? once daily What are your blood sugars ranging?  Fasting: NA Before meals: 160 After meals:  NA Bedtime: NA During the week, how often does your blood glucose drop below 70? Never Are you checking your feet daily/regularly?   Adherence Review: Is the patient currently on a STATIN medication? Yes Is the patient currently on ACE/ARB medication? No Does the patient have >5 day gap between last estimated fill dates? No    Star Rating Drugs: Atorvastatin 09/25/20 90 ds  [place] Pharmacist Assistant [phone]   Time spent:52

## 2021-01-03 DIAGNOSIS — S61512D Laceration without foreign body of left wrist, subsequent encounter: Secondary | ICD-10-CM | POA: Diagnosis not present

## 2021-01-03 DIAGNOSIS — Z7982 Long term (current) use of aspirin: Secondary | ICD-10-CM | POA: Diagnosis not present

## 2021-01-03 DIAGNOSIS — Z9181 History of falling: Secondary | ICD-10-CM | POA: Diagnosis not present

## 2021-01-03 DIAGNOSIS — Z950 Presence of cardiac pacemaker: Secondary | ICD-10-CM | POA: Diagnosis not present

## 2021-01-03 DIAGNOSIS — G4733 Obstructive sleep apnea (adult) (pediatric): Secondary | ICD-10-CM | POA: Diagnosis not present

## 2021-01-03 DIAGNOSIS — I2510 Atherosclerotic heart disease of native coronary artery without angina pectoris: Secondary | ICD-10-CM | POA: Diagnosis not present

## 2021-01-03 DIAGNOSIS — Z7984 Long term (current) use of oral hypoglycemic drugs: Secondary | ICD-10-CM | POA: Diagnosis not present

## 2021-01-03 DIAGNOSIS — M6281 Muscle weakness (generalized): Secondary | ICD-10-CM | POA: Diagnosis not present

## 2021-01-03 DIAGNOSIS — I129 Hypertensive chronic kidney disease with stage 1 through stage 4 chronic kidney disease, or unspecified chronic kidney disease: Secondary | ICD-10-CM | POA: Diagnosis not present

## 2021-01-03 DIAGNOSIS — E1122 Type 2 diabetes mellitus with diabetic chronic kidney disease: Secondary | ICD-10-CM | POA: Diagnosis not present

## 2021-01-03 DIAGNOSIS — Z87440 Personal history of urinary (tract) infections: Secondary | ICD-10-CM | POA: Diagnosis not present

## 2021-01-03 DIAGNOSIS — N1832 Chronic kidney disease, stage 3b: Secondary | ICD-10-CM | POA: Diagnosis not present

## 2021-01-03 DIAGNOSIS — E785 Hyperlipidemia, unspecified: Secondary | ICD-10-CM | POA: Diagnosis not present

## 2021-01-03 DIAGNOSIS — I69398 Other sequelae of cerebral infarction: Secondary | ICD-10-CM | POA: Diagnosis not present

## 2021-01-03 DIAGNOSIS — D631 Anemia in chronic kidney disease: Secondary | ICD-10-CM | POA: Diagnosis not present

## 2021-01-03 DIAGNOSIS — Z794 Long term (current) use of insulin: Secondary | ICD-10-CM | POA: Diagnosis not present

## 2021-01-03 DIAGNOSIS — R1310 Dysphagia, unspecified: Secondary | ICD-10-CM | POA: Diagnosis not present

## 2021-01-03 NOTE — Telephone Encounter (Signed)
Patient's wife reports pt is sleeping all the time and BP has been on low side, she wants to know if she can take off clonidine patch and just use amlodipine for BP.  BP was 106/56 the morning of call. Advised pt it is ok to remove clonidine patch and monitor BP closely. Advised pt to notify CCM team or PCP office if BP increases > 140/90.

## 2021-01-06 ENCOUNTER — Encounter: Payer: Self-pay | Admitting: Neurology

## 2021-01-06 ENCOUNTER — Ambulatory Visit: Payer: Medicare Other | Admitting: Neurology

## 2021-01-06 ENCOUNTER — Other Ambulatory Visit: Payer: Self-pay

## 2021-01-06 VITALS — BP 108/65 | HR 82 | Ht 69.0 in | Wt 182.8 lb

## 2021-01-06 DIAGNOSIS — Z8679 Personal history of other diseases of the circulatory system: Secondary | ICD-10-CM | POA: Diagnosis not present

## 2021-01-06 DIAGNOSIS — Z87898 Personal history of other specified conditions: Secondary | ICD-10-CM | POA: Diagnosis not present

## 2021-01-06 DIAGNOSIS — E1142 Type 2 diabetes mellitus with diabetic polyneuropathy: Secondary | ICD-10-CM | POA: Diagnosis not present

## 2021-01-06 DIAGNOSIS — I63411 Cerebral infarction due to embolism of right middle cerebral artery: Secondary | ICD-10-CM | POA: Diagnosis not present

## 2021-01-06 DIAGNOSIS — E785 Hyperlipidemia, unspecified: Secondary | ICD-10-CM | POA: Diagnosis not present

## 2021-01-06 DIAGNOSIS — I10 Essential (primary) hypertension: Secondary | ICD-10-CM | POA: Diagnosis not present

## 2021-01-06 DIAGNOSIS — R2990 Unspecified symptoms and signs involving the nervous system: Secondary | ICD-10-CM

## 2021-01-06 DIAGNOSIS — Z95828 Presence of other vascular implants and grafts: Secondary | ICD-10-CM

## 2021-01-06 NOTE — Progress Notes (Signed)
NEUROLOGY CONSULTATION NOTE  Frank [name] MRN: [id number] DOB: 03/15/1941  Referring provider: Kathrynn Speed, MD (hospital referral) Primary care provider: Scarlette Calico, MD  Reason for consult:  stroke  Assessment/Plan:    Stroke-like event (worsening of baseline left leg weakness) - likely recrudescence of prior stroke likely due to being off of Brilinta for 7 days. 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  LDL goal less than 70 - Normotensive blood pressure - Hgb A1c goal less than 7 2. Keppra for seizure prophylaxis. 3. Follow up as needed.   Subjective:  Frank [name] is an 80 year old right-handed male with HTN, HLD, DM II with polyneuropathy, AVB II s/p PPM, CKD, OSA, and history of TBI secondary to traumatic SDH s/p evacuation and symptomatic seizures (on Keppra) who presents for stroke.  History supplemented by hospital records.  Patient had a right MCA stroke with hemorrhagic conversion back in March 2022 presenting as right facial droop and right sided weakness.  He underwent revascularization of the occluded inferior division right MCA and right ICA stent.  Developed cerebral edema and monitored in ICU.  Discharged on ASA 81mg  daily and Brilinta 45mg  BID.    He was admitted to [facility] on 11/18/2020 for worsening left leg weakness.  MRI of brain personally reviewed showed chronic right MCA infarct and chronic bilateral SDH but no acute findings.  CTA of head and neck personally reviewed showed no large vessel occlusion or hemodynamically significant stenosis.  LDL was 53 and Hgb A1c was 7.7.  EEG showed left centro-temporal region  breach artifact and generalized and right hemispheric slowing but no epileptiform discharges or electrographic seizures were seen.  Apparently, he had run out of Brilinta and had not been able to get a refill for 7 days.  He was also found to have a UTI.  He was advised to continue ASA and Brilinta.  He was discharged to rehab where he caught bronchitis.  He is now living back at home with his wife.  He has an aid.  He has PT/OT daily.  He says he is feeling well.   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, Ischemic optic   OSA (obstructive sleep apnea)    cpap   TBI (traumatic brain injury) ([place])     PAST SURGICAL HISTORY: Past Surgical History:  Procedure Laterality Date   APPENDECTOMY     arm fracture Left 2011   with hardware   BURR HOLE Bilateral 08/04/2017   Procedure: Haskell Flirt;  Surgeon: [name], MD;  Location: [facility];  Service: Neurosurgery;  Laterality: Bilateral;   COLONOSCOPY     CRANIOTOMY Left 12/01/2017   Procedure: Trudee Kuster Holes CRANIOTOMY HEMATOMA EVACUATION SUBDURAL;  Surgeon: [name], MD;  Location: [place];  Service: Neurosurgery;  Laterality: Left;   ESOPHAGOGASTRODUODENOSCOPY  2006   gastritis   IR PERCUTANEOUS ART THROMBECTOMY/INFUSION INTRACRANIAL INC DIAG ANGIO  09/09/2020   PACEMAKER IMPLANT N/A 05/14/2020   Procedure: PACEMAKER IMPLANT;  Surgeon: [name], MD;  Location: [facility];  Service: Cardiovascular;  Laterality: N/A;   RADIOLOGY WITH ANESTHESIA N/A 09/09/2020   Procedure: IR WITH ANESTHESIA;  Surgeon: Radiologist, Medication, MD;  Location: [place] [facility];  Service: Radiology;  Laterality: N/A;   ROTATOR CUFF REPAIR      SEPTOPLASTY  1959   Deviated Septum   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     aspirin 81 MG chewable tablet Chew 1 tablet (81 mg total) by mouth daily.     atorvastatin (LIPITOR) 10 MG tablet Take 1 tablet (10 mg total) by mouth daily.     benzonatate (TESSALON) 100 MG capsule Take 1 capsule (100 mg total) by mouth 3 (three) times daily. 20 capsule 0   cloNIDine (CATAPRES - DOSED IN MG/24 HR) 0.1 mg/24hr patch Place 1 patch (0.1 mg total) onto the skin once a week. (Patient not taking: Reported on 01/03/2021) 12 patch 1   Dextromethorphan-guaiFENesin (ROBITUSSIN DM PO) Take 15 mLs by mouth See admin instructions. Qid x 7 days     Ferric Maltol (ACCRUFER) 30 MG CAPS Take 1 capsule by mouth in the morning and at bedtime. (Patient taking differently: Take 30 mg by mouth in the morning and at bedtime.) 180 capsule 1   FLUoxetine (PROZAC) 20 MG capsule TAKE 1 CAPSULE BY MOUTH  DAILY (Patient taking differently: Take 20 mg by mouth daily.) 90 capsule 1   fluticasone (FLONASE) 50 MCG/ACT nasal spray Place 1 spray into both nostrils daily.     glipiZIDE (GLUCOTROL XL) 10 MG 24 hr tablet TAKE 1 TABLET BY MOUTH  DAILY 90 tablet 0   Glucagon (GVOKE HYPOPEN 2-PACK) 1 MG/0.2ML SOAJ Inject 1 Act into the skin daily as needed. (Patient taking differently: Inject 1 each into the skin as needed (low blood sugar).) 2 mL 5   guaiFENesin (ROBITUSSIN) 100 MG/5ML SOLN Take 5 mLs (100 mg total) by mouth every 4 (four) hours as needed for cough or to loosen phlegm. 236 mL 0   hydrocortisone cream 1 % Apply 1 application topically 4 (four) times daily as needed for itching.     Insulin Pen Needle 32G X 6 MM MISC 1 Act by Does not apply route daily. 100 each 1   levETIRAcetam (KEPPRA) 500 MG tablet Take 500 mg by mouth 2 (two) times daily.      loratadine (CLARITIN) 10 MG tablet Take 1 tablet (10 mg total) by mouth every evening.     OVER THE COUNTER MEDICATION Take 120 mLs by mouth in the morning and at bedtime. Med pass 2.0     OXYGEN Inhale 2-4 L into the lungs See admin instructions. To keep O2 greater than 90%     pantoprazole (PROTONIX) 40 MG tablet TAKE 1 TABLET BY MOUTH  DAILY (Patient taking differently: Take 40 mg by mouth daily.) 90 tablet 1   PRESCRIPTION MEDICATION CPAP- At bedtime     senna-docusate (SENOKOT-S) 8.6-50 MG tablet Take 1 tablet by mouth at bedtime as needed for mild constipation.     solifenacin (VESICARE) 10 MG tablet Take 1 tablet (10 mg total) by mouth  daily. 90 tablet 1   ticagrelor (BRILINTA) 90 MG TABS tablet Take 0.5 tablets (45 mg total) by mouth 2 (two) times daily. 180 tablet 0   TOUJEO MAX SOLOSTAR 300 UNIT/ML Solostar Pen INJECT SUBCUTANEOUSLY 10  UNITS DAILY 6 mL 1   No current facility-administered medications on file prior to visit.    ALLERGIES: Allergies  Allergen Reactions   Lisinopril Cough   Invokana [Canagliflozin] Other (See Comments)    Stopped by MD   Penicillins Other (See Comments)    Allergic as a child.  Patient does not remember reaction.  Has had cephalasporins without problems.   Sulfamethoxazole Other (See Comments)    Unknown reaction- from childhood    FAMILY HISTORY: Family History  Problem Relation Age of Onset   Heart disease Mother    Breast cancer Mother    Stroke Father    Allergies Father        Father and children   Coronary artery disease Brother    Diabetes Neg Hx    Colon cancer Neg Hx     Objective:  Blood pressure 108/65, pulse 82, height 5\' 9"  (1.753 m), weight 182 lb 12.8 oz (82.9 kg), SpO2 98 %. General: No acute distress.  Patient appears well-groomed.   Head:  Normocephalic/atraumatic Eyes:  fundi examined but not visualized Neck: supple, no paraspinal tenderness, full range of motion Back: No paraspinal tenderness Heart: regular  rate and rhythm Lungs: Clear to auscultation bilaterally. Vascular: No carotid bruits. Neurological Exam: Mental status: alert and oriented to person, place, and time, recent and remote memory intact, fund of knowledge intact, attention and concentration intact, speech fluent and not dysarthric, language intact. Cranial nerves: CN I: not tested CN II: pupils equal, round and reactive to light, visual fields intact CN III, IV, VI:  full range of motion, no nystagmus, no ptosis CN V: facial sensation intact. CN VII: upper and lower face symmetric CN VIII: hearing intact CN IX, X: gag intact, uvula midline CN XI: sternocleidomastoid and trapezius muscles intact CN XII: tongue midline Bulk & Tone: normal, no fasciculations. Motor:  muscle strength 4/5 left proximal leg, otherwise, 5/5 throughout Sensation:  Pinprick and vibratory sensation intact. Deep Tendon Reflexes:  2+ throughout,  toes downgoing.   Finger to nose testing:  Without dysmetria.   Heel to shin:  Without dysmetria.   Gait:  In wheelchair.  Difficulty standing up and maintaining balance or ambulating independently.    Thank you for allowing me to take part in the care of this patient.  Metta Clines, DO  CC: Scarlette Calico, MD

## 2021-01-06 NOTE — Patient Instructions (Signed)
The stroke symptoms likely related to the UTI, not a new stroke.  No change in management Follow up as needed.

## 2021-01-08 DIAGNOSIS — D631 Anemia in chronic kidney disease: Secondary | ICD-10-CM | POA: Diagnosis not present

## 2021-01-08 DIAGNOSIS — R1310 Dysphagia, unspecified: Secondary | ICD-10-CM | POA: Diagnosis not present

## 2021-01-08 DIAGNOSIS — S61512D Laceration without foreign body of left wrist, subsequent encounter: Secondary | ICD-10-CM | POA: Diagnosis not present

## 2021-01-08 DIAGNOSIS — Z9181 History of falling: Secondary | ICD-10-CM | POA: Diagnosis not present

## 2021-01-08 DIAGNOSIS — E1122 Type 2 diabetes mellitus with diabetic chronic kidney disease: Secondary | ICD-10-CM | POA: Diagnosis not present

## 2021-01-08 DIAGNOSIS — I2510 Atherosclerotic heart disease of native coronary artery without angina pectoris: Secondary | ICD-10-CM | POA: Diagnosis not present

## 2021-01-08 DIAGNOSIS — E785 Hyperlipidemia, unspecified: Secondary | ICD-10-CM | POA: Diagnosis not present

## 2021-01-08 DIAGNOSIS — G4733 Obstructive sleep apnea (adult) (pediatric): Secondary | ICD-10-CM | POA: Diagnosis not present

## 2021-01-08 DIAGNOSIS — Z794 Long term (current) use of insulin: Secondary | ICD-10-CM | POA: Diagnosis not present

## 2021-01-08 DIAGNOSIS — Z7982 Long term (current) use of aspirin: Secondary | ICD-10-CM | POA: Diagnosis not present

## 2021-01-08 DIAGNOSIS — I69398 Other sequelae of cerebral infarction: Secondary | ICD-10-CM | POA: Diagnosis not present

## 2021-01-08 DIAGNOSIS — N1832 Chronic kidney disease, stage 3b: Secondary | ICD-10-CM | POA: Diagnosis not present

## 2021-01-08 DIAGNOSIS — M6281 Muscle weakness (generalized): Secondary | ICD-10-CM | POA: Diagnosis not present

## 2021-01-08 DIAGNOSIS — Z87440 Personal history of urinary (tract) infections: Secondary | ICD-10-CM | POA: Diagnosis not present

## 2021-01-08 DIAGNOSIS — Z7984 Long term (current) use of oral hypoglycemic drugs: Secondary | ICD-10-CM | POA: Diagnosis not present

## 2021-01-08 DIAGNOSIS — Z950 Presence of cardiac pacemaker: Secondary | ICD-10-CM | POA: Diagnosis not present

## 2021-01-08 DIAGNOSIS — I129 Hypertensive chronic kidney disease with stage 1 through stage 4 chronic kidney disease, or unspecified chronic kidney disease: Secondary | ICD-10-CM | POA: Diagnosis not present

## 2021-01-09 ENCOUNTER — Other Ambulatory Visit: Payer: Self-pay | Admitting: Internal Medicine

## 2021-01-09 DIAGNOSIS — Z7984 Long term (current) use of oral hypoglycemic drugs: Secondary | ICD-10-CM | POA: Diagnosis not present

## 2021-01-09 DIAGNOSIS — Z9181 History of falling: Secondary | ICD-10-CM | POA: Diagnosis not present

## 2021-01-09 DIAGNOSIS — M6281 Muscle weakness (generalized): Secondary | ICD-10-CM | POA: Diagnosis not present

## 2021-01-09 DIAGNOSIS — S61512D Laceration without foreign body of left wrist, subsequent encounter: Secondary | ICD-10-CM | POA: Diagnosis not present

## 2021-01-09 DIAGNOSIS — E1142 Type 2 diabetes mellitus with diabetic polyneuropathy: Secondary | ICD-10-CM

## 2021-01-09 DIAGNOSIS — E785 Hyperlipidemia, unspecified: Secondary | ICD-10-CM | POA: Diagnosis not present

## 2021-01-09 DIAGNOSIS — I69398 Other sequelae of cerebral infarction: Secondary | ICD-10-CM | POA: Diagnosis not present

## 2021-01-09 DIAGNOSIS — R1310 Dysphagia, unspecified: Secondary | ICD-10-CM | POA: Diagnosis not present

## 2021-01-09 DIAGNOSIS — D631 Anemia in chronic kidney disease: Secondary | ICD-10-CM | POA: Diagnosis not present

## 2021-01-09 DIAGNOSIS — G4733 Obstructive sleep apnea (adult) (pediatric): Secondary | ICD-10-CM | POA: Diagnosis not present

## 2021-01-09 DIAGNOSIS — Z794 Long term (current) use of insulin: Secondary | ICD-10-CM | POA: Diagnosis not present

## 2021-01-09 DIAGNOSIS — E1122 Type 2 diabetes mellitus with diabetic chronic kidney disease: Secondary | ICD-10-CM | POA: Diagnosis not present

## 2021-01-09 DIAGNOSIS — Z87440 Personal history of urinary (tract) infections: Secondary | ICD-10-CM | POA: Diagnosis not present

## 2021-01-09 DIAGNOSIS — Z950 Presence of cardiac pacemaker: Secondary | ICD-10-CM | POA: Diagnosis not present

## 2021-01-09 DIAGNOSIS — I2510 Atherosclerotic heart disease of native coronary artery without angina pectoris: Secondary | ICD-10-CM | POA: Diagnosis not present

## 2021-01-09 DIAGNOSIS — I129 Hypertensive chronic kidney disease with stage 1 through stage 4 chronic kidney disease, or unspecified chronic kidney disease: Secondary | ICD-10-CM | POA: Diagnosis not present

## 2021-01-09 DIAGNOSIS — Z7982 Long term (current) use of aspirin: Secondary | ICD-10-CM | POA: Diagnosis not present

## 2021-01-09 DIAGNOSIS — N1832 Chronic kidney disease, stage 3b: Secondary | ICD-10-CM | POA: Diagnosis not present

## 2021-01-09 MED ORDER — TOUJEO MAX SOLOSTAR 300 UNIT/ML ~~LOC~~ SOPN: 10.0000 [IU] | PEN_INJECTOR | Freq: Every day | SUBCUTANEOUS | 1 refills | Status: DC

## 2021-01-09 NOTE — Progress Notes (Signed)
Chronic Care Management Pharmacy Assistant   Name: Frank [name]  MRN: [id number] DOB: 07/16/1940   Reason for Encounter: Chart Review    Medications: Outpatient Encounter Medications as of 01/02/2021  Medication Sig Note   amLODipine (NORVASC) 10 MG tablet Take 1 tablet (10 mg total) by mouth daily.    aspirin 81 MG chewable tablet Chew 1 tablet (81 mg total) by mouth daily.    atorvastatin (LIPITOR) 10 MG tablet Take 1 tablet (10 mg total) by mouth daily.    cloNIDine (CATAPRES - DOSED IN MG/24 HR) 0.1 mg/24hr patch Place 1 patch (0.1 mg total) onto the skin once a week. (Patient not taking: No sig reported) 01/03/2021: On hold as of 01/02/21 due to borderline low BP. Pt is monitoring BP closely on just amlodipine.   Ferric Maltol (ACCRUFER) 30 MG CAPS Take 1 capsule by mouth in the morning and at bedtime. (Patient taking differently: Take 30 mg by mouth in the morning and at bedtime.)    FLUoxetine (PROZAC) 20 MG capsule TAKE 1 CAPSULE BY MOUTH  DAILY (Patient taking differently: Take 20 mg by mouth daily.)    glipiZIDE (GLUCOTROL XL) 10 MG 24 hr tablet TAKE 1 TABLET BY MOUTH  DAILY    Glucagon (GVOKE HYPOPEN 2-PACK) 1 MG/0.2ML SOAJ Inject 1 Act into the skin daily as needed. (Patient taking differently: Inject 1 each into the skin as needed (low blood sugar).)    Insulin Pen Needle 32G X 6 MM MISC 1 Act by Does not apply route daily.    levETIRAcetam (KEPPRA) 500 MG tablet Take 500 mg by mouth 2 (two) times daily.    OVER THE COUNTER MEDICATION Take 120 mLs by mouth in the morning and at bedtime. Med pass 2.0    pantoprazole (PROTONIX) 40 MG tablet TAKE 1 TABLET BY MOUTH  DAILY (Patient taking differently: Take 40 mg by mouth daily.)    PRESCRIPTION MEDICATION CPAP- At bedtime    solifenacin (VESICARE) 10 MG tablet Take 1 tablet (10 mg total) by mouth daily.    ticagrelor (BRILINTA) 90 MG TABS tablet Take 0.5 tablets (45 mg total) by mouth 2 (two) times daily.    TOUJEO MAX  SOLOSTAR 300 UNIT/ML Solostar Pen INJECT SUBCUTANEOUSLY 10  UNITS DAILY    [DISCONTINUED] benzonatate (TESSALON) 100 MG capsule Take 1 capsule (100 mg total) by mouth 3 (three) times daily.    [DISCONTINUED] Dextromethorphan-guaiFENesin (ROBITUSSIN DM PO) Take 15 mLs by mouth See admin instructions. Qid x 7 days    [DISCONTINUED] fluticasone (FLONASE) 50 MCG/ACT nasal spray Place 1 spray into both nostrils daily.    [DISCONTINUED] guaiFENesin (ROBITUSSIN) 100 MG/5ML SOLN Take 5 mLs (100 mg total) by mouth every 4 (four) hours as needed for cough or to loosen phlegm.    [DISCONTINUED] hydrocortisone cream 1 % Apply 1 application topically 4 (four) times daily as needed for itching.    [DISCONTINUED] loratadine (CLARITIN) 10 MG tablet Take 1 tablet (10 mg total) by mouth every evening.    [DISCONTINUED] OXYGEN Inhale 2-4 L into the lungs See admin instructions. To keep O2 greater than 90%    [DISCONTINUED] senna-docusate (SENOKOT-S) 8.6-50 MG tablet Take 1 tablet by mouth at bedtime as needed for mild constipation.    No facility-administered encounter medications on file as of 01/02/2021.   Pharmacist Review  Reviewed chart for medication changes and adherence.  Recent OV, Consult or Hospital visit: 01/06/21 Metta Clines, DO Neurology No medication changes indicated  No  gaps in adherence identified. Patient has follow up scheduled with pharmacy team. No further action required.   [place] Pharmacist Assistant [phone]   Time spent:7

## 2021-01-10 DIAGNOSIS — I2510 Atherosclerotic heart disease of native coronary artery without angina pectoris: Secondary | ICD-10-CM | POA: Diagnosis not present

## 2021-01-10 DIAGNOSIS — N1832 Chronic kidney disease, stage 3b: Secondary | ICD-10-CM | POA: Diagnosis not present

## 2021-01-10 DIAGNOSIS — E1122 Type 2 diabetes mellitus with diabetic chronic kidney disease: Secondary | ICD-10-CM | POA: Diagnosis not present

## 2021-01-10 DIAGNOSIS — Z7982 Long term (current) use of aspirin: Secondary | ICD-10-CM | POA: Diagnosis not present

## 2021-01-10 DIAGNOSIS — I129 Hypertensive chronic kidney disease with stage 1 through stage 4 chronic kidney disease, or unspecified chronic kidney disease: Secondary | ICD-10-CM | POA: Diagnosis not present

## 2021-01-10 DIAGNOSIS — Z7984 Long term (current) use of oral hypoglycemic drugs: Secondary | ICD-10-CM | POA: Diagnosis not present

## 2021-01-10 DIAGNOSIS — Z87440 Personal history of urinary (tract) infections: Secondary | ICD-10-CM | POA: Diagnosis not present

## 2021-01-10 DIAGNOSIS — G4733 Obstructive sleep apnea (adult) (pediatric): Secondary | ICD-10-CM | POA: Diagnosis not present

## 2021-01-10 DIAGNOSIS — Z9181 History of falling: Secondary | ICD-10-CM | POA: Diagnosis not present

## 2021-01-10 DIAGNOSIS — Z794 Long term (current) use of insulin: Secondary | ICD-10-CM | POA: Diagnosis not present

## 2021-01-10 DIAGNOSIS — M6281 Muscle weakness (generalized): Secondary | ICD-10-CM | POA: Diagnosis not present

## 2021-01-10 DIAGNOSIS — D631 Anemia in chronic kidney disease: Secondary | ICD-10-CM | POA: Diagnosis not present

## 2021-01-10 DIAGNOSIS — E785 Hyperlipidemia, unspecified: Secondary | ICD-10-CM | POA: Diagnosis not present

## 2021-01-10 DIAGNOSIS — S61512D Laceration without foreign body of left wrist, subsequent encounter: Secondary | ICD-10-CM | POA: Diagnosis not present

## 2021-01-10 DIAGNOSIS — I69398 Other sequelae of cerebral infarction: Secondary | ICD-10-CM | POA: Diagnosis not present

## 2021-01-10 DIAGNOSIS — R1310 Dysphagia, unspecified: Secondary | ICD-10-CM | POA: Diagnosis not present

## 2021-01-10 DIAGNOSIS — Z950 Presence of cardiac pacemaker: Secondary | ICD-10-CM | POA: Diagnosis not present

## 2021-01-13 DIAGNOSIS — S61512D Laceration without foreign body of left wrist, subsequent encounter: Secondary | ICD-10-CM | POA: Diagnosis not present

## 2021-01-13 DIAGNOSIS — I129 Hypertensive chronic kidney disease with stage 1 through stage 4 chronic kidney disease, or unspecified chronic kidney disease: Secondary | ICD-10-CM | POA: Diagnosis not present

## 2021-01-13 DIAGNOSIS — Z950 Presence of cardiac pacemaker: Secondary | ICD-10-CM | POA: Diagnosis not present

## 2021-01-13 DIAGNOSIS — Z794 Long term (current) use of insulin: Secondary | ICD-10-CM | POA: Diagnosis not present

## 2021-01-13 DIAGNOSIS — R1310 Dysphagia, unspecified: Secondary | ICD-10-CM | POA: Diagnosis not present

## 2021-01-13 DIAGNOSIS — N1832 Chronic kidney disease, stage 3b: Secondary | ICD-10-CM | POA: Diagnosis not present

## 2021-01-13 DIAGNOSIS — Z7984 Long term (current) use of oral hypoglycemic drugs: Secondary | ICD-10-CM | POA: Diagnosis not present

## 2021-01-13 DIAGNOSIS — I69398 Other sequelae of cerebral infarction: Secondary | ICD-10-CM | POA: Diagnosis not present

## 2021-01-13 DIAGNOSIS — E785 Hyperlipidemia, unspecified: Secondary | ICD-10-CM | POA: Diagnosis not present

## 2021-01-13 DIAGNOSIS — D631 Anemia in chronic kidney disease: Secondary | ICD-10-CM | POA: Diagnosis not present

## 2021-01-13 DIAGNOSIS — E1122 Type 2 diabetes mellitus with diabetic chronic kidney disease: Secondary | ICD-10-CM | POA: Diagnosis not present

## 2021-01-13 DIAGNOSIS — I2510 Atherosclerotic heart disease of native coronary artery without angina pectoris: Secondary | ICD-10-CM | POA: Diagnosis not present

## 2021-01-13 DIAGNOSIS — M6281 Muscle weakness (generalized): Secondary | ICD-10-CM | POA: Diagnosis not present

## 2021-01-13 DIAGNOSIS — G4733 Obstructive sleep apnea (adult) (pediatric): Secondary | ICD-10-CM | POA: Diagnosis not present

## 2021-01-13 DIAGNOSIS — Z9181 History of falling: Secondary | ICD-10-CM | POA: Diagnosis not present

## 2021-01-13 DIAGNOSIS — Z7982 Long term (current) use of aspirin: Secondary | ICD-10-CM | POA: Diagnosis not present

## 2021-01-13 DIAGNOSIS — Z87440 Personal history of urinary (tract) infections: Secondary | ICD-10-CM | POA: Diagnosis not present

## 2021-01-14 DIAGNOSIS — M6281 Muscle weakness (generalized): Secondary | ICD-10-CM | POA: Diagnosis not present

## 2021-01-14 DIAGNOSIS — Z7984 Long term (current) use of oral hypoglycemic drugs: Secondary | ICD-10-CM | POA: Diagnosis not present

## 2021-01-14 DIAGNOSIS — Z87440 Personal history of urinary (tract) infections: Secondary | ICD-10-CM | POA: Diagnosis not present

## 2021-01-14 DIAGNOSIS — G4733 Obstructive sleep apnea (adult) (pediatric): Secondary | ICD-10-CM | POA: Diagnosis not present

## 2021-01-14 DIAGNOSIS — R1310 Dysphagia, unspecified: Secondary | ICD-10-CM | POA: Diagnosis not present

## 2021-01-14 DIAGNOSIS — I2510 Atherosclerotic heart disease of native coronary artery without angina pectoris: Secondary | ICD-10-CM | POA: Diagnosis not present

## 2021-01-14 DIAGNOSIS — D631 Anemia in chronic kidney disease: Secondary | ICD-10-CM | POA: Diagnosis not present

## 2021-01-14 DIAGNOSIS — I69398 Other sequelae of cerebral infarction: Secondary | ICD-10-CM | POA: Diagnosis not present

## 2021-01-14 DIAGNOSIS — Z950 Presence of cardiac pacemaker: Secondary | ICD-10-CM | POA: Diagnosis not present

## 2021-01-14 DIAGNOSIS — N1832 Chronic kidney disease, stage 3b: Secondary | ICD-10-CM | POA: Diagnosis not present

## 2021-01-14 DIAGNOSIS — S61512D Laceration without foreign body of left wrist, subsequent encounter: Secondary | ICD-10-CM | POA: Diagnosis not present

## 2021-01-14 DIAGNOSIS — Z7982 Long term (current) use of aspirin: Secondary | ICD-10-CM | POA: Diagnosis not present

## 2021-01-14 DIAGNOSIS — E1122 Type 2 diabetes mellitus with diabetic chronic kidney disease: Secondary | ICD-10-CM | POA: Diagnosis not present

## 2021-01-14 DIAGNOSIS — I129 Hypertensive chronic kidney disease with stage 1 through stage 4 chronic kidney disease, or unspecified chronic kidney disease: Secondary | ICD-10-CM | POA: Diagnosis not present

## 2021-01-14 DIAGNOSIS — E785 Hyperlipidemia, unspecified: Secondary | ICD-10-CM | POA: Diagnosis not present

## 2021-01-14 DIAGNOSIS — Z794 Long term (current) use of insulin: Secondary | ICD-10-CM | POA: Diagnosis not present

## 2021-01-14 DIAGNOSIS — Z9181 History of falling: Secondary | ICD-10-CM | POA: Diagnosis not present

## 2021-01-15 DIAGNOSIS — S61512D Laceration without foreign body of left wrist, subsequent encounter: Secondary | ICD-10-CM | POA: Diagnosis not present

## 2021-01-15 DIAGNOSIS — I129 Hypertensive chronic kidney disease with stage 1 through stage 4 chronic kidney disease, or unspecified chronic kidney disease: Secondary | ICD-10-CM | POA: Diagnosis not present

## 2021-01-15 DIAGNOSIS — Z794 Long term (current) use of insulin: Secondary | ICD-10-CM | POA: Diagnosis not present

## 2021-01-15 DIAGNOSIS — R1310 Dysphagia, unspecified: Secondary | ICD-10-CM | POA: Diagnosis not present

## 2021-01-15 DIAGNOSIS — I69398 Other sequelae of cerebral infarction: Secondary | ICD-10-CM | POA: Diagnosis not present

## 2021-01-15 DIAGNOSIS — Z9181 History of falling: Secondary | ICD-10-CM | POA: Diagnosis not present

## 2021-01-15 DIAGNOSIS — Z87440 Personal history of urinary (tract) infections: Secondary | ICD-10-CM | POA: Diagnosis not present

## 2021-01-15 DIAGNOSIS — Z950 Presence of cardiac pacemaker: Secondary | ICD-10-CM | POA: Diagnosis not present

## 2021-01-15 DIAGNOSIS — Z7984 Long term (current) use of oral hypoglycemic drugs: Secondary | ICD-10-CM | POA: Diagnosis not present

## 2021-01-15 DIAGNOSIS — G4733 Obstructive sleep apnea (adult) (pediatric): Secondary | ICD-10-CM | POA: Diagnosis not present

## 2021-01-15 DIAGNOSIS — M6281 Muscle weakness (generalized): Secondary | ICD-10-CM | POA: Diagnosis not present

## 2021-01-15 DIAGNOSIS — E785 Hyperlipidemia, unspecified: Secondary | ICD-10-CM | POA: Diagnosis not present

## 2021-01-15 DIAGNOSIS — D631 Anemia in chronic kidney disease: Secondary | ICD-10-CM | POA: Diagnosis not present

## 2021-01-15 DIAGNOSIS — E1122 Type 2 diabetes mellitus with diabetic chronic kidney disease: Secondary | ICD-10-CM | POA: Diagnosis not present

## 2021-01-15 DIAGNOSIS — N1832 Chronic kidney disease, stage 3b: Secondary | ICD-10-CM | POA: Diagnosis not present

## 2021-01-15 DIAGNOSIS — Z7982 Long term (current) use of aspirin: Secondary | ICD-10-CM | POA: Diagnosis not present

## 2021-01-15 DIAGNOSIS — I2510 Atherosclerotic heart disease of native coronary artery without angina pectoris: Secondary | ICD-10-CM | POA: Diagnosis not present

## 2021-01-16 ENCOUNTER — Ambulatory Visit: Payer: Medicare Other | Admitting: Internal Medicine

## 2021-01-16 DIAGNOSIS — I69398 Other sequelae of cerebral infarction: Secondary | ICD-10-CM | POA: Diagnosis not present

## 2021-01-16 DIAGNOSIS — Z87440 Personal history of urinary (tract) infections: Secondary | ICD-10-CM | POA: Diagnosis not present

## 2021-01-16 DIAGNOSIS — E1122 Type 2 diabetes mellitus with diabetic chronic kidney disease: Secondary | ICD-10-CM | POA: Diagnosis not present

## 2021-01-16 DIAGNOSIS — Z7982 Long term (current) use of aspirin: Secondary | ICD-10-CM | POA: Diagnosis not present

## 2021-01-16 DIAGNOSIS — I2510 Atherosclerotic heart disease of native coronary artery without angina pectoris: Secondary | ICD-10-CM | POA: Diagnosis not present

## 2021-01-16 DIAGNOSIS — S61512D Laceration without foreign body of left wrist, subsequent encounter: Secondary | ICD-10-CM | POA: Diagnosis not present

## 2021-01-16 DIAGNOSIS — D631 Anemia in chronic kidney disease: Secondary | ICD-10-CM | POA: Diagnosis not present

## 2021-01-16 DIAGNOSIS — M6281 Muscle weakness (generalized): Secondary | ICD-10-CM | POA: Diagnosis not present

## 2021-01-16 DIAGNOSIS — Z9181 History of falling: Secondary | ICD-10-CM | POA: Diagnosis not present

## 2021-01-16 DIAGNOSIS — G4733 Obstructive sleep apnea (adult) (pediatric): Secondary | ICD-10-CM | POA: Diagnosis not present

## 2021-01-16 DIAGNOSIS — R1310 Dysphagia, unspecified: Secondary | ICD-10-CM | POA: Diagnosis not present

## 2021-01-16 DIAGNOSIS — Z7984 Long term (current) use of oral hypoglycemic drugs: Secondary | ICD-10-CM | POA: Diagnosis not present

## 2021-01-16 DIAGNOSIS — N1832 Chronic kidney disease, stage 3b: Secondary | ICD-10-CM | POA: Diagnosis not present

## 2021-01-16 DIAGNOSIS — E785 Hyperlipidemia, unspecified: Secondary | ICD-10-CM | POA: Diagnosis not present

## 2021-01-16 DIAGNOSIS — Z950 Presence of cardiac pacemaker: Secondary | ICD-10-CM | POA: Diagnosis not present

## 2021-01-16 DIAGNOSIS — Z794 Long term (current) use of insulin: Secondary | ICD-10-CM | POA: Diagnosis not present

## 2021-01-16 DIAGNOSIS — I129 Hypertensive chronic kidney disease with stage 1 through stage 4 chronic kidney disease, or unspecified chronic kidney disease: Secondary | ICD-10-CM | POA: Diagnosis not present

## 2021-01-17 DIAGNOSIS — Z87440 Personal history of urinary (tract) infections: Secondary | ICD-10-CM | POA: Diagnosis not present

## 2021-01-17 DIAGNOSIS — D631 Anemia in chronic kidney disease: Secondary | ICD-10-CM | POA: Diagnosis not present

## 2021-01-17 DIAGNOSIS — E785 Hyperlipidemia, unspecified: Secondary | ICD-10-CM | POA: Diagnosis not present

## 2021-01-17 DIAGNOSIS — Z9181 History of falling: Secondary | ICD-10-CM | POA: Diagnosis not present

## 2021-01-17 DIAGNOSIS — G4733 Obstructive sleep apnea (adult) (pediatric): Secondary | ICD-10-CM | POA: Diagnosis not present

## 2021-01-17 DIAGNOSIS — E1122 Type 2 diabetes mellitus with diabetic chronic kidney disease: Secondary | ICD-10-CM | POA: Diagnosis not present

## 2021-01-17 DIAGNOSIS — S61512D Laceration without foreign body of left wrist, subsequent encounter: Secondary | ICD-10-CM | POA: Diagnosis not present

## 2021-01-17 DIAGNOSIS — M6281 Muscle weakness (generalized): Secondary | ICD-10-CM | POA: Diagnosis not present

## 2021-01-17 DIAGNOSIS — Z950 Presence of cardiac pacemaker: Secondary | ICD-10-CM | POA: Diagnosis not present

## 2021-01-17 DIAGNOSIS — R1310 Dysphagia, unspecified: Secondary | ICD-10-CM | POA: Diagnosis not present

## 2021-01-17 DIAGNOSIS — Z7984 Long term (current) use of oral hypoglycemic drugs: Secondary | ICD-10-CM | POA: Diagnosis not present

## 2021-01-17 DIAGNOSIS — I69398 Other sequelae of cerebral infarction: Secondary | ICD-10-CM | POA: Diagnosis not present

## 2021-01-17 DIAGNOSIS — N1832 Chronic kidney disease, stage 3b: Secondary | ICD-10-CM | POA: Diagnosis not present

## 2021-01-17 DIAGNOSIS — I2510 Atherosclerotic heart disease of native coronary artery without angina pectoris: Secondary | ICD-10-CM | POA: Diagnosis not present

## 2021-01-17 DIAGNOSIS — I129 Hypertensive chronic kidney disease with stage 1 through stage 4 chronic kidney disease, or unspecified chronic kidney disease: Secondary | ICD-10-CM | POA: Diagnosis not present

## 2021-01-17 DIAGNOSIS — Z794 Long term (current) use of insulin: Secondary | ICD-10-CM | POA: Diagnosis not present

## 2021-01-17 DIAGNOSIS — Z7982 Long term (current) use of aspirin: Secondary | ICD-10-CM | POA: Diagnosis not present

## 2021-01-19 ENCOUNTER — Other Ambulatory Visit: Payer: Self-pay | Admitting: Internal Medicine

## 2021-01-19 DIAGNOSIS — J418 Mixed simple and mucopurulent chronic bronchitis: Secondary | ICD-10-CM | POA: Diagnosis not present

## 2021-01-19 DIAGNOSIS — R1312 Dysphagia, oropharyngeal phase: Secondary | ICD-10-CM | POA: Diagnosis not present

## 2021-01-19 DIAGNOSIS — Z9981 Dependence on supplemental oxygen: Secondary | ICD-10-CM | POA: Diagnosis not present

## 2021-01-19 DIAGNOSIS — R0902 Hypoxemia: Secondary | ICD-10-CM | POA: Diagnosis not present

## 2021-01-19 DIAGNOSIS — J9811 Atelectasis: Secondary | ICD-10-CM | POA: Diagnosis not present

## 2021-01-19 DIAGNOSIS — R569 Unspecified convulsions: Secondary | ICD-10-CM

## 2021-01-19 MED ORDER — LEVETIRACETAM 500 MG PO TABS: 500.0000 mg | ORAL_TABLET | Freq: Two times a day (BID) | ORAL | 1 refills | Status: DC

## 2021-01-20 DIAGNOSIS — M6281 Muscle weakness (generalized): Secondary | ICD-10-CM | POA: Diagnosis not present

## 2021-01-20 DIAGNOSIS — E785 Hyperlipidemia, unspecified: Secondary | ICD-10-CM | POA: Diagnosis not present

## 2021-01-20 DIAGNOSIS — Z794 Long term (current) use of insulin: Secondary | ICD-10-CM | POA: Diagnosis not present

## 2021-01-20 DIAGNOSIS — R1310 Dysphagia, unspecified: Secondary | ICD-10-CM | POA: Diagnosis not present

## 2021-01-20 DIAGNOSIS — Z7982 Long term (current) use of aspirin: Secondary | ICD-10-CM | POA: Diagnosis not present

## 2021-01-20 DIAGNOSIS — E1122 Type 2 diabetes mellitus with diabetic chronic kidney disease: Secondary | ICD-10-CM | POA: Diagnosis not present

## 2021-01-20 DIAGNOSIS — G4733 Obstructive sleep apnea (adult) (pediatric): Secondary | ICD-10-CM | POA: Diagnosis not present

## 2021-01-20 DIAGNOSIS — I2510 Atherosclerotic heart disease of native coronary artery without angina pectoris: Secondary | ICD-10-CM | POA: Diagnosis not present

## 2021-01-20 DIAGNOSIS — Z87440 Personal history of urinary (tract) infections: Secondary | ICD-10-CM | POA: Diagnosis not present

## 2021-01-20 DIAGNOSIS — Z7984 Long term (current) use of oral hypoglycemic drugs: Secondary | ICD-10-CM | POA: Diagnosis not present

## 2021-01-20 DIAGNOSIS — Z950 Presence of cardiac pacemaker: Secondary | ICD-10-CM | POA: Diagnosis not present

## 2021-01-20 DIAGNOSIS — D631 Anemia in chronic kidney disease: Secondary | ICD-10-CM | POA: Diagnosis not present

## 2021-01-20 DIAGNOSIS — I129 Hypertensive chronic kidney disease with stage 1 through stage 4 chronic kidney disease, or unspecified chronic kidney disease: Secondary | ICD-10-CM | POA: Diagnosis not present

## 2021-01-20 DIAGNOSIS — Z9181 History of falling: Secondary | ICD-10-CM | POA: Diagnosis not present

## 2021-01-20 DIAGNOSIS — S61512D Laceration without foreign body of left wrist, subsequent encounter: Secondary | ICD-10-CM | POA: Diagnosis not present

## 2021-01-20 DIAGNOSIS — N1832 Chronic kidney disease, stage 3b: Secondary | ICD-10-CM | POA: Diagnosis not present

## 2021-01-20 DIAGNOSIS — I69398 Other sequelae of cerebral infarction: Secondary | ICD-10-CM | POA: Diagnosis not present

## 2021-01-27 ENCOUNTER — Other Ambulatory Visit: Payer: Self-pay

## 2021-01-27 ENCOUNTER — Other Ambulatory Visit: Payer: Medicare Other | Admitting: Nurse Practitioner

## 2021-01-27 VITALS — BP 110/58 | HR 90 | Resp 18

## 2021-01-27 DIAGNOSIS — Z515 Encounter for palliative care: Secondary | ICD-10-CM

## 2021-01-27 DIAGNOSIS — S61512D Laceration without foreign body of left wrist, subsequent encounter: Secondary | ICD-10-CM | POA: Diagnosis not present

## 2021-01-27 DIAGNOSIS — R531 Weakness: Secondary | ICD-10-CM | POA: Diagnosis not present

## 2021-01-27 DIAGNOSIS — Z794 Long term (current) use of insulin: Secondary | ICD-10-CM | POA: Diagnosis not present

## 2021-01-27 DIAGNOSIS — E785 Hyperlipidemia, unspecified: Secondary | ICD-10-CM | POA: Diagnosis not present

## 2021-01-27 DIAGNOSIS — I69398 Other sequelae of cerebral infarction: Secondary | ICD-10-CM | POA: Diagnosis not present

## 2021-01-27 DIAGNOSIS — Z9181 History of falling: Secondary | ICD-10-CM | POA: Diagnosis not present

## 2021-01-27 DIAGNOSIS — G4733 Obstructive sleep apnea (adult) (pediatric): Secondary | ICD-10-CM | POA: Diagnosis not present

## 2021-01-27 DIAGNOSIS — I2510 Atherosclerotic heart disease of native coronary artery without angina pectoris: Secondary | ICD-10-CM | POA: Diagnosis not present

## 2021-01-27 DIAGNOSIS — E1122 Type 2 diabetes mellitus with diabetic chronic kidney disease: Secondary | ICD-10-CM | POA: Diagnosis not present

## 2021-01-27 DIAGNOSIS — K5900 Constipation, unspecified: Secondary | ICD-10-CM | POA: Diagnosis not present

## 2021-01-27 DIAGNOSIS — Z87440 Personal history of urinary (tract) infections: Secondary | ICD-10-CM | POA: Diagnosis not present

## 2021-01-27 DIAGNOSIS — I129 Hypertensive chronic kidney disease with stage 1 through stage 4 chronic kidney disease, or unspecified chronic kidney disease: Secondary | ICD-10-CM | POA: Diagnosis not present

## 2021-01-27 DIAGNOSIS — I952 Hypotension due to drugs: Secondary | ICD-10-CM | POA: Diagnosis not present

## 2021-01-27 DIAGNOSIS — Z7982 Long term (current) use of aspirin: Secondary | ICD-10-CM | POA: Diagnosis not present

## 2021-01-27 DIAGNOSIS — Z950 Presence of cardiac pacemaker: Secondary | ICD-10-CM | POA: Diagnosis not present

## 2021-01-27 DIAGNOSIS — R1310 Dysphagia, unspecified: Secondary | ICD-10-CM | POA: Diagnosis not present

## 2021-01-27 DIAGNOSIS — M6281 Muscle weakness (generalized): Secondary | ICD-10-CM | POA: Diagnosis not present

## 2021-01-27 DIAGNOSIS — D631 Anemia in chronic kidney disease: Secondary | ICD-10-CM | POA: Diagnosis not present

## 2021-01-27 DIAGNOSIS — N1832 Chronic kidney disease, stage 3b: Secondary | ICD-10-CM | POA: Diagnosis not present

## 2021-01-27 DIAGNOSIS — Z7984 Long term (current) use of oral hypoglycemic drugs: Secondary | ICD-10-CM | POA: Diagnosis not present

## 2021-01-27 NOTE — Progress Notes (Signed)
[facility] Consult Note Telephone: [phone]  Fax: [phone]   Date of encounter: 01/27/21 PATIENT NAME: Frank [name] [address] [place] [address]   [phone] (home)  DOB: 01-Jul-1941 MRN: [id number]  PRIMARY CARE PROVIDER:    Janith Lima, MD,  [place] [address] [phone]  REFERRING PROVIDER:   Janith Lima, MD [address] [place],  [place] [address] [phone]  RESPONSIBLE PARTY:    Contact Information     Name Relation Home Work Mobile   Frank [name] Spouse [phone]  [phone]   Frank [name], Frank [name]   [phone]   Frank [name] Daughter   [phone]     I met face to face with patient in home. Patient's wife and his caregiver present during visit.  Palliative Care was asked to follow this patient by consultation request of  Janith Lima, MD to address advance care planning and complex medical decision making. This is a follow up visit.                                   ASSESSMENT AND PLAN / RECOMMENDATIONS:   Advance Care Planning/Goals of Care: Goals include to maximize quality of life and symptom management. Our advance care planning conversation included a discussion about:    Exploration of personal, cultural or spiritual beliefs that might influence medical decisions  CODE STATUS: DNR Goal of care: Patient's goal of care is function. Directives: Signed DNR and MOST form present in home, copy on [facility] EMR. Details of MOST form include limited additional intervention,antibiotics if indicated, IV fluids if indicated, no feeding tube. Palliative care will continue to provide support to patient, family and medical team.  Symptom Management/Plan: Hypertension: continue current plan of care with administration of Amlodipine in the evenings. Continue routine blood pressure monitoring. Generalized weakness: condition improved. Continue  physical therapy. Maintain safety, prevent falls by encouraging consistent use of assistive device during ambulation. Continue to encourage as much independence as possible. Continue to encourage adequate oral and fluid intake. Continue to offer nutritional supplement Glucerna to augment caloric intake. Continue to follow up with Neurology.  Constipation: Take Miralax 17g daily as needed if no BM in more than 2 days. Questions and concerns were addressed. Patient and family was encouraged to call with questions and/or concerns. Provided general support and encouragement.  Follow up Palliative Care Visit: Palliative care will continue to follow for complex medical decision making, advance care planning, and clarification of goals. Return in about 4-6 weeks or prn.  I spent 60 minutes providing this consultation. More than 50% of the time in this consultation was spent in counseling and care coordination.  PPS: 50%  HOSPICE ELIGIBILITY/DIAGNOSIS: TBD  CHIEF COMPLAIN: follow up visit for low blood pressure  History obtained from review of Epic EMR, discussion with Frank [name] and his wife.  HISTORY OF PRESENT ILLNESS:  Frank [name] is a 80 y.o. year old male with multiple medical problems including Type 2 DM, BPH, CKD 3, sleep apnea on CPAP, depression, hx of traumatic brain injury s/p brain surgery( (on Keppra for seizure prevention), hx of CVA in Feb 2022. Patient report blood pressure is improved since he switched the administration of his Amlodipine to evening from morning administration. Review of BP log in the last 2 weeks showed SBP range of 105-123, no report of bradycardia. No report  of chest pain or increased SOB. Patient report improved function since the last palliative care visit. Report moving from the use of wheelchair for ambulation to now using a front wheel walker, report improved gait. No report of falls. Patient denied fever, denied chills. No chewing or swallowing  problems. He report fair appetite, endorsed occassional constipation. Ten systems reviewed and are negative for acute change, except as noted in the HPI.   I reviewed available labs, medications, imaging, studies and related documents from the EMR.  Records reviewed and summarized above.   Vitals:   01/27/21 1600  BP: (!) 110/58  Pulse: 90  Resp: 18  SpO2: 98%    Physical Exam: Current and past weights: 184lbs down from 210lbs, Ht 5f 10", BMI 26.4kg/m2 Constitutional: NAD General: chronically ill and frail appearing, sitting in wheelchair at his dinning area actively participating in visit discussion.  EYES: anicteric sclera, no discharge ENMT: intact hearing, oral mucous membranes moist CV: S1S2 normal, no LE edema Pulmonary: LCTA, no increased work of breathing, no cough, room air Abdomen: no ascites GU: deferred MSK: sarcopenia, moves all extremities Skin: warm and dry, dressing on wound tear to left hand and left lower leg intact. 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  The palliative care team will continue to follow. Please call our office at [phone] if we can be of additional assistance.    , DNP, AGPCNP-BC  COVID-19 PATIENT SCREENING TOOL Asked and negative response unless otherwise noted:   Have you had symptoms of covid, tested positive or been in contact with someone with  symptoms/positive test in the past 5-10 days?   

## 2021-01-28 ENCOUNTER — Telehealth: Payer: Self-pay | Admitting: Internal Medicine

## 2021-01-28 NOTE — Telephone Encounter (Signed)
[facility] FYI:  ---Caller states that her husband took his medications last night and she realized that he took a double dose of his evening medications at 6:30pm. She has held off on giving him the day meds today. Blood pressure was 111/59 O2 Sat is 92%, 125 blood sugar. He had a stroke in Feb. 22. No symptoms during the night. He ate this am and seems OK.  Advised to call poison center now

## 2021-02-03 DIAGNOSIS — N1832 Chronic kidney disease, stage 3b: Secondary | ICD-10-CM | POA: Diagnosis not present

## 2021-02-03 DIAGNOSIS — I129 Hypertensive chronic kidney disease with stage 1 through stage 4 chronic kidney disease, or unspecified chronic kidney disease: Secondary | ICD-10-CM | POA: Diagnosis not present

## 2021-02-03 DIAGNOSIS — G4733 Obstructive sleep apnea (adult) (pediatric): Secondary | ICD-10-CM | POA: Diagnosis not present

## 2021-02-03 DIAGNOSIS — E785 Hyperlipidemia, unspecified: Secondary | ICD-10-CM | POA: Diagnosis not present

## 2021-02-03 DIAGNOSIS — I69398 Other sequelae of cerebral infarction: Secondary | ICD-10-CM | POA: Diagnosis not present

## 2021-02-03 DIAGNOSIS — Z950 Presence of cardiac pacemaker: Secondary | ICD-10-CM | POA: Diagnosis not present

## 2021-02-03 DIAGNOSIS — Z87440 Personal history of urinary (tract) infections: Secondary | ICD-10-CM | POA: Diagnosis not present

## 2021-02-03 DIAGNOSIS — Z794 Long term (current) use of insulin: Secondary | ICD-10-CM | POA: Diagnosis not present

## 2021-02-03 DIAGNOSIS — I2510 Atherosclerotic heart disease of native coronary artery without angina pectoris: Secondary | ICD-10-CM | POA: Diagnosis not present

## 2021-02-03 DIAGNOSIS — Z9181 History of falling: Secondary | ICD-10-CM | POA: Diagnosis not present

## 2021-02-03 DIAGNOSIS — Z7982 Long term (current) use of aspirin: Secondary | ICD-10-CM | POA: Diagnosis not present

## 2021-02-03 DIAGNOSIS — M6281 Muscle weakness (generalized): Secondary | ICD-10-CM | POA: Diagnosis not present

## 2021-02-03 DIAGNOSIS — E1122 Type 2 diabetes mellitus with diabetic chronic kidney disease: Secondary | ICD-10-CM | POA: Diagnosis not present

## 2021-02-03 DIAGNOSIS — S61512D Laceration without foreign body of left wrist, subsequent encounter: Secondary | ICD-10-CM | POA: Diagnosis not present

## 2021-02-03 DIAGNOSIS — Z7984 Long term (current) use of oral hypoglycemic drugs: Secondary | ICD-10-CM | POA: Diagnosis not present

## 2021-02-03 DIAGNOSIS — R1310 Dysphagia, unspecified: Secondary | ICD-10-CM | POA: Diagnosis not present

## 2021-02-03 DIAGNOSIS — D631 Anemia in chronic kidney disease: Secondary | ICD-10-CM | POA: Diagnosis not present

## 2021-02-06 DIAGNOSIS — D692 Other nonthrombocytopenic purpura: Secondary | ICD-10-CM | POA: Diagnosis not present

## 2021-02-06 DIAGNOSIS — Z7982 Long term (current) use of aspirin: Secondary | ICD-10-CM | POA: Diagnosis not present

## 2021-02-06 DIAGNOSIS — Z7984 Long term (current) use of oral hypoglycemic drugs: Secondary | ICD-10-CM | POA: Diagnosis not present

## 2021-02-06 DIAGNOSIS — Z9181 History of falling: Secondary | ICD-10-CM | POA: Diagnosis not present

## 2021-02-06 DIAGNOSIS — L570 Actinic keratosis: Secondary | ICD-10-CM | POA: Diagnosis not present

## 2021-02-06 DIAGNOSIS — L821 Other seborrheic keratosis: Secondary | ICD-10-CM | POA: Diagnosis not present

## 2021-02-06 DIAGNOSIS — Z950 Presence of cardiac pacemaker: Secondary | ICD-10-CM | POA: Diagnosis not present

## 2021-02-06 DIAGNOSIS — R1310 Dysphagia, unspecified: Secondary | ICD-10-CM | POA: Diagnosis not present

## 2021-02-06 DIAGNOSIS — S61512D Laceration without foreign body of left wrist, subsequent encounter: Secondary | ICD-10-CM | POA: Diagnosis not present

## 2021-02-06 DIAGNOSIS — Z794 Long term (current) use of insulin: Secondary | ICD-10-CM | POA: Diagnosis not present

## 2021-02-06 DIAGNOSIS — G4733 Obstructive sleep apnea (adult) (pediatric): Secondary | ICD-10-CM | POA: Diagnosis not present

## 2021-02-06 DIAGNOSIS — Z85828 Personal history of other malignant neoplasm of skin: Secondary | ICD-10-CM | POA: Diagnosis not present

## 2021-02-06 DIAGNOSIS — D1801 Hemangioma of skin and subcutaneous tissue: Secondary | ICD-10-CM | POA: Diagnosis not present

## 2021-02-06 DIAGNOSIS — I2510 Atherosclerotic heart disease of native coronary artery without angina pectoris: Secondary | ICD-10-CM | POA: Diagnosis not present

## 2021-02-06 DIAGNOSIS — E1122 Type 2 diabetes mellitus with diabetic chronic kidney disease: Secondary | ICD-10-CM | POA: Diagnosis not present

## 2021-02-06 DIAGNOSIS — D631 Anemia in chronic kidney disease: Secondary | ICD-10-CM | POA: Diagnosis not present

## 2021-02-06 DIAGNOSIS — I129 Hypertensive chronic kidney disease with stage 1 through stage 4 chronic kidney disease, or unspecified chronic kidney disease: Secondary | ICD-10-CM | POA: Diagnosis not present

## 2021-02-06 DIAGNOSIS — E785 Hyperlipidemia, unspecified: Secondary | ICD-10-CM | POA: Diagnosis not present

## 2021-02-06 DIAGNOSIS — N1832 Chronic kidney disease, stage 3b: Secondary | ICD-10-CM | POA: Diagnosis not present

## 2021-02-06 DIAGNOSIS — I69398 Other sequelae of cerebral infarction: Secondary | ICD-10-CM | POA: Diagnosis not present

## 2021-02-06 DIAGNOSIS — M6281 Muscle weakness (generalized): Secondary | ICD-10-CM | POA: Diagnosis not present

## 2021-02-06 DIAGNOSIS — S41112A Laceration without foreign body of left upper arm, initial encounter: Secondary | ICD-10-CM | POA: Diagnosis not present

## 2021-02-06 DIAGNOSIS — Z87440 Personal history of urinary (tract) infections: Secondary | ICD-10-CM | POA: Diagnosis not present

## 2021-02-10 ENCOUNTER — Ambulatory Visit (INDEPENDENT_AMBULATORY_CARE_PROVIDER_SITE_OTHER): Payer: Medicare Other | Admitting: Internal Medicine

## 2021-02-10 ENCOUNTER — Encounter: Payer: Self-pay | Admitting: Internal Medicine

## 2021-02-10 ENCOUNTER — Telehealth: Payer: Self-pay | Admitting: Internal Medicine

## 2021-02-10 ENCOUNTER — Other Ambulatory Visit: Payer: Self-pay

## 2021-02-10 ENCOUNTER — Ambulatory Visit: Payer: Medicare Other | Admitting: Internal Medicine

## 2021-02-10 VITALS — BP 116/60 | HR 71 | Temp 98.1°F | Resp 16 | Ht 69.0 in | Wt 184.0 lb

## 2021-02-10 DIAGNOSIS — I700 Atherosclerosis of aorta: Secondary | ICD-10-CM | POA: Insufficient documentation

## 2021-02-10 DIAGNOSIS — E1142 Type 2 diabetes mellitus with diabetic polyneuropathy: Secondary | ICD-10-CM

## 2021-02-10 DIAGNOSIS — D508 Other iron deficiency anemias: Secondary | ICD-10-CM | POA: Diagnosis not present

## 2021-02-10 LAB — CBC WITH DIFFERENTIAL/PLATELET
Basophils Absolute: 0.1 10*3/uL (ref 0.0–0.1)
Basophils Relative: 0.7 % (ref 0.0–3.0)
Eosinophils Absolute: 0.4 10*3/uL (ref 0.0–0.7)
Eosinophils Relative: 4.5 % (ref 0.0–5.0)
HCT: 33.9 % — ABNORMAL LOW (ref 39.0–52.0)
Hemoglobin: 11 g/dL — ABNORMAL LOW (ref 13.0–17.0)
Lymphocytes Relative: 13.2 % (ref 12.0–46.0)
Lymphs Abs: 1.2 10*3/uL (ref 0.7–4.0)
MCHC: 32.6 g/dL (ref 30.0–36.0)
MCV: 82.5 fl (ref 78.0–100.0)
Monocytes Absolute: 0.9 10*3/uL (ref 0.1–1.0)
Monocytes Relative: 9.4 % (ref 3.0–12.0)
Neutro Abs: 6.6 10*3/uL (ref 1.4–7.7)
Neutrophils Relative %: 72.2 % (ref 43.0–77.0)
Platelets: 205 10*3/uL (ref 150.0–400.0)
RBC: 4.11 Mil/uL — ABNORMAL LOW (ref 4.22–5.81)
RDW: 16.9 % — ABNORMAL HIGH (ref 11.5–15.5)
WBC: 9.2 10*3/uL (ref 4.0–10.5)

## 2021-02-10 LAB — BASIC METABOLIC PANEL
BUN: 26 mg/dL — ABNORMAL HIGH (ref 6–23)
CO2: 29 mEq/L (ref 19–32)
Calcium: 9.1 mg/dL (ref 8.4–10.5)
Chloride: 101 mEq/L (ref 96–112)
Creatinine, Ser: 1.33 mg/dL (ref 0.40–1.50)
GFR: 49.82 mL/min — ABNORMAL LOW (ref >60.00)
Glucose, Bld: 105 mg/dL — ABNORMAL HIGH (ref 70–99)
Potassium: 4.2 mEq/L (ref 3.5–5.1)
Sodium: 138 mEq/L (ref 135–145)

## 2021-02-10 LAB — FERRITIN: Ferritin: 51.7 ng/mL (ref 22.0–322.0)

## 2021-02-10 LAB — HEMOGLOBIN A1C: Hgb A1c MFr Bld: 7.7 % — ABNORMAL HIGH (ref 4.6–6.5)

## 2021-02-10 LAB — IRON: Iron: 58 ug/dL (ref 42–165)

## 2021-02-10 MED ORDER — CYANOCOBALAMIN 1000 MCG/ML IJ SOLN
1000.0000 ug | Freq: Once | INTRAMUSCULAR | Status: AC
  Administered 2021-02-10: 1000 ug via INTRAMUSCULAR

## 2021-02-10 NOTE — Telephone Encounter (Signed)
D/c order has been signed by PCP and faxed back to [facility].

## 2021-02-10 NOTE — Telephone Encounter (Signed)
Ok to d/c O2?   I will do an order for you to sign if you agree.

## 2021-02-10 NOTE — Progress Notes (Signed)
Subjective:  Patient ID: Frank [name], male    DOB: [birth year]  Age: 80 y.o. MRN: [id number]  CC: Anemia and Diabetes  This visit occurred during the SARS-CoV-2 public health emergency.  Safety protocols were in place, including screening questions prior to the visit, additional usage of staff PPE, and extensive cleaning of exam room while observing appropriate contact time as indicated for disinfecting solutions.    HPI Frank [name] presents for f/up -  His wife is with him today.  She continues to express frustration about how hard it is to take care of him.  He continues to get up out of his wheelchair on his own and has falls and gets stranded in places like the kitchen.  The family is looking into placement in a personal care home.  He has no new symptoms to offer.  Outpatient Medications Prior to Visit  Medication Sig Dispense Refill   amLODipine (NORVASC) 10 MG tablet Take 1 tablet (10 mg total) by mouth daily.     aspirin 81 MG chewable tablet Chew 1 tablet (81 mg total) by mouth daily.     atorvastatin (LIPITOR) 10 MG tablet Take 1 tablet (10 mg total) by mouth daily.     cloNIDine (CATAPRES - DOSED IN MG/24 HR) 0.1 mg/24hr patch Place 1 patch (0.1 mg total) onto the skin once a week. 12 patch 1   Ferric Maltol (ACCRUFER) 30 MG CAPS Take 1 capsule by mouth in the morning and at bedtime. (Patient taking differently: Take 30 mg by mouth in the morning and at bedtime.) 180 capsule 1   FLUoxetine (PROZAC) 20 MG capsule TAKE 1 CAPSULE BY MOUTH  DAILY (Patient taking differently: Take 20 mg by mouth daily.) 90 capsule 1   glipiZIDE (GLUCOTROL XL) 10 MG 24 hr tablet TAKE 1 TABLET BY MOUTH  DAILY 90 tablet 0   Glucagon (GVOKE HYPOPEN 2-PACK) 1 MG/0.2ML SOAJ Inject 1 Act into the skin daily as needed. (Patient taking differently: Inject 1 each into the skin as needed (low blood sugar).) 2 mL 5   insulin glargine, 2 Unit Dial, (TOUJEO MAX SOLOSTAR) 300 UNIT/ML Solostar Pen  Inject 10 Units into the skin daily. 6 mL 1   Insulin Pen Needle 32G X 6 MM MISC 1 Act by Does not apply route daily. 100 each 1   levETIRAcetam (KEPPRA) 500 MG tablet Take 1 tablet (500 mg total) by mouth 2 (two) times daily. 180 tablet 1   OVER THE COUNTER MEDICATION Take 120 mLs by mouth in the morning and at bedtime. Med pass 2.0     pantoprazole (PROTONIX) 40 MG tablet TAKE 1 TABLET BY MOUTH  DAILY (Patient taking differently: Take 40 mg by mouth daily.) 90 tablet 1   PRESCRIPTION MEDICATION CPAP- At bedtime     solifenacin (VESICARE) 10 MG tablet Take 1 tablet (10 mg total) by mouth daily. 90 tablet 1   ticagrelor (BRILINTA) 90 MG TABS tablet Take 0.5 tablets (45 mg total) by mouth 2 (two) times daily. 180 tablet 0   No facility-administered medications prior to visit.    ROS Review of Systems  Constitutional:  Positive for fatigue. Negative for diaphoresis and unexpected weight change.  HENT: Negative.    Eyes: Negative.   Respiratory:  Negative for cough, shortness of breath and wheezing.   Cardiovascular:  Negative for chest pain, palpitations and leg swelling.  Gastrointestinal:  Negative for abdominal pain, constipation, diarrhea, nausea and vomiting.  Endocrine: Negative.  Genitourinary: Negative.  Negative for difficulty urinating.  Musculoskeletal:  Positive for gait problem. Negative for myalgias.  Skin: Negative.   Neurological:  Positive for weakness. Negative for dizziness, light-headedness and numbness.  Hematological:  Negative for adenopathy. Does not bruise/bleed easily.  Psychiatric/Behavioral: Negative.     Objective:  BP 116/60 (BP Location: Left Arm, Patient Position: Sitting, Cuff Size: Large)   Pulse 71   Temp 98.1 F (36.7 C) (Oral)   Resp 16   Ht '5\' 9"'$  (1.753 m)   Wt 184 lb (83.5 kg)   SpO2 100%   BMI 27.17 kg/m   BP Readings from Last 3 Encounters:  02/10/21 116/60  01/27/21 (!) 110/58  01/06/21 108/65    Wt Readings from Last 3  Encounters:  02/10/21 184 lb (83.5 kg)  01/06/21 182 lb 12.8 oz (82.9 kg)  11/18/20 205 lb (93 kg)    Physical Exam Vitals reviewed.  Constitutional:      Appearance: He is ill-appearing (in a wheel chair).  HENT:     Nose: Nose normal.     Mouth/Throat:     Mouth: Mucous membranes are moist.  Eyes:     Conjunctiva/sclera: Conjunctivae normal.  Cardiovascular:     Rate and Rhythm: Normal rate and regular rhythm.     Heart sounds: No murmur heard. Pulmonary:     Effort: Pulmonary effort is normal.     Breath sounds: No stridor. Examination of the right-lower field reveals rales. Examination of the left-lower field reveals rales. Rales present. No wheezing or rhonchi.  Abdominal:     General: Abdomen is flat.     Palpations: There is no mass.     Tenderness: There is no abdominal tenderness. There is no guarding.     Hernia: No hernia is present.  Musculoskeletal:        General: Normal range of motion.     Cervical back: Neck supple.     Right lower leg: No edema.     Left lower leg: No edema.  Lymphadenopathy:     Cervical: No cervical adenopathy.  Skin:    General: Skin is warm and dry.  Neurological:     General: No focal deficit present.  Psychiatric:        Mood and Affect: Mood normal.        Behavior: Behavior normal.    Lab Results  Component Value Date   WBC 9.2 02/10/2021   HGB 11.0 (L) 02/10/2021   HCT 33.9 (L) 02/10/2021   PLT 205.0 02/10/2021   GLUCOSE 105 (H) 02/10/2021   CHOL 104 11/19/2020   TRIG 91 11/19/2020   HDL 33 (L) 11/19/2020   LDLCALC 53 11/19/2020   ALT 15 12/26/2020   AST 16 12/26/2020   NA 138 02/10/2021   K 4.2 02/10/2021   CL 101 02/10/2021   CREATININE 1.33 02/10/2021   BUN 26 (H) 02/10/2021   CO2 29 02/10/2021   TSH 0.459 11/18/2020   PSA 1.08 07/10/2015   INR 1.2 11/18/2020   HGBA1C 7.7 (H) 02/10/2021   MICROALBUR 67.2 04/23/2020    CT HEAD WO CONTRAST  Result Date: 12/14/2020 CLINICAL DATA:  80 year old male status  post fall. EXAM: CT HEAD WITHOUT CONTRAST TECHNIQUE: Contiguous axial images were obtained from the base of the skull through the vertex without intravenous contrast. COMPARISON:  Brain MRI, CTA and head CT 11/18/2020. 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 Minimal associated hemispheric mass effect is stable. Occasional hyperdense blood products are stable. 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 Other bilateral chronic small vessel disease. No ventriculomegaly. No midline shift or acute intracranial mass effect. No acute intracranial hemorrhage or acute cortically based infarct identified. Vascular: Chronic posterior right MCA branch stent. Calcified atherosclerosis at the skull base. Skull: Stable left superior convexity craniotomy and right superior frontal burr hole. No acute osseous abnormality identified. Sinuses/Orbits: Visualized paranasal sinuses and mastoids are stable and well aerated., mild ethmoid mucosal thickening. Other: No acute orbit or scalp soft tissue finding. IMPRESSION: 1. Stable since last month.  No acute traumatic injury identified. 2. Stable unresolved bilateral mixed density subdural hematomas, up to 9-10 mm in thickness. No significant intracranial mass effect. 3. Posterior right MCA stent with right MCA territory encephalomalacia. Electronically Signed   By: Genevie Ann M.D.   On: 12/14/2020 06:03    Assessment & Plan:   Tyberious was seen today for anemia and diabetes.  Diagnoses and all orders for this visit:  Atherosclerosis of aorta ([place])- Risk factor modifications addressed.  Type 2 diabetes mellitus with peripheral neuropathy ([place])- His blood sugar is adequately well controlled. -     Basic metabolic panel; Future -     Hemoglobin A1c; Future -     Hemoglobin A1c -     Basic metabolic  panel  Iron deficiency anemia secondary to inadequate dietary iron intake- Improvement noted. -     CBC with Differential/Platelet; Future -     Iron; Future -     Ferritin; Future -     cyanocobalamin ((VITAMIN B-12)) injection 1,000 mcg -     Ferritin -     Iron -     CBC with Differential/Platelet  I am having [name] [name] "ken" maintain his Insulin Pen Needle, Gvoke HypoPen 2-Pack, PRESCRIPTION MEDICATION, aspirin, amLODipine, atorvastatin, FLUoxetine, pantoprazole, ACCRUFeR, cloNIDine, solifenacin, ticagrelor, OVER THE COUNTER MEDICATION, glipiZIDE, Toujeo Max SoloStar, and levETIRAcetam. We administered cyanocobalamin.  Meds ordered this encounter  Medications   cyanocobalamin ((VITAMIN B-12)) injection 1,000 mcg      Follow-up: Return in about 4 months (around 06/12/2021).  Scarlette Calico, MD

## 2021-02-10 NOTE — Patient Instructions (Signed)
[employer] medicine (25th ed., pp. 1059-1068). [place], [place]: Elsevier.">  Anemia  Anemia is a condition in which there is not enough red blood cells or hemoglobin in the blood. Hemoglobin is a substance in red blood cells thatcarries oxygen. When you do not have enough red blood cells or hemoglobin (are anemic), your body cannot get enough oxygen and your organs may not work properly. Asa result, you may feel very tired or have other problems. What are the causes? Common causes of anemia include: Excessive bleeding. Anemia can be caused by excessive bleeding inside or outside the body, including bleeding from the intestines or from heavy menstrual periods in females. Poor nutrition. Long-lasting (chronic) kidney, thyroid, and liver disease. Bone marrow disorders, spleen problems, and blood disorders. Cancer and treatments for cancer. HIV (human immunodeficiency virus) and AIDS (acquired immunodeficiency syndrome). Infections, medicines, and autoimmune disorders that destroy red blood cells. What are the signs or symptoms? Symptoms of this condition include: Minor weakness. Dizziness. Headache, or difficulties concentrating and sleeping. Heartbeats that feel irregular or faster than normal (palpitations). Shortness of breath, especially with exercise. Pale skin, lips, and nails, or cold hands and feet. Indigestion and nausea. Symptoms may occur suddenly or develop slowly. If your anemia is mild, you maynot have symptoms. How is this diagnosed? This condition is diagnosed based on blood tests, your medical history, and a physical exam. In some cases, a test may be needed in which cells are removed from the soft tissue inside of a bone and looked at under a microscope (bone marrow biopsy). Your health care provider may also check your stool (feces) for blood and may do additional testing to look for the cause of yourbleeding. Other tests may include: Imaging tests, such as a CT scan or  MRI. A procedure to see inside your esophagus and stomach (endoscopy). A procedure to see inside your colon and rectum (colonoscopy). How is this treated? Treatment for this condition depends on the cause. If you continue to lose a lot of blood, you may need to be treated at a hospital. Treatment may include: Taking supplements of iron, vitamin B12, or folic acid. Taking a hormone medicine (erythropoietin) that can help to stimulate red blood cell growth. Having a blood transfusion. This may be needed if you lose a lot of blood. Making changes to your diet. Having surgery to remove your spleen. Follow these instructions at home: Take over-the-counter and prescription medicines only as told by your health care provider. Take supplements only as told by your health care provider. Follow any diet instructions that you were given by your health care provider. Keep all follow-up visits as told by your health care provider. This is important. Contact a health care provider if: You develop new bleeding anywhere in the body. Get help right away if: You are very weak. You are short of breath. You have pain in your abdomen or chest. You are dizzy or feel faint. You have trouble concentrating. You have bloody stools, black stools, or tarry stools. You vomit repeatedly or you vomit up blood. These symptoms may represent a serious problem that is an emergency. Do not wait to see if the symptoms will go away. Get medical help right away. Call your local emergency services (911 in the [place] Do not drive yourself to the hospital. Summary Anemia is a condition in which you do not have enough red blood cells or enough of a substance in your red blood cells that carries oxygen (hemoglobin). Symptoms may occur suddenly   or develop slowly. If your anemia is mild, you may not have symptoms. This condition is diagnosed with blood tests, a medical history, and a physical exam. Other tests may be  needed. Treatment for this condition depends on the cause of the anemia. This information is not intended to replace advice given to you by your health care provider. Make sure you discuss any questions you have with your healthcare provider. Document Revised: 06/06/2019 Document Reviewed: 06/06/2019 Elsevier Patient Education  2022 [employer].

## 2021-02-10 NOTE — Telephone Encounter (Signed)
   Per spouse please fax discontinue and pick up order to [facility] for [place] phone [phone] Fax [phone], [place]

## 2021-02-12 ENCOUNTER — Ambulatory Visit (INDEPENDENT_AMBULATORY_CARE_PROVIDER_SITE_OTHER): Payer: Medicare Other

## 2021-02-12 ENCOUNTER — Telehealth: Payer: Self-pay | Admitting: Neurology

## 2021-02-12 DIAGNOSIS — I441 Atrioventricular block, second degree: Secondary | ICD-10-CM | POA: Diagnosis not present

## 2021-02-12 LAB — CUP PACEART REMOTE DEVICE CHECK
Battery Remaining Longevity: 134 mo
Battery Voltage: 3.11 V
Brady Statistic AP VP Percent: 1.06 %
Brady Statistic AP VS Percent: 0 %
Brady Statistic AS VP Percent: 98.73 %
Brady Statistic AS VS Percent: 0.21 %
Brady Statistic RA Percent Paced: 1.07 %
Brady Statistic RV Percent Paced: 99.79 %
Date Time Interrogation Session: 20220803020205
Implantable Lead Implant Date: 20211102
Implantable Lead Implant Date: 20211102
Implantable Lead Location: 753859
Implantable Lead Location: 753860
Implantable Lead Model: 3830
Implantable Lead Model: 5076
Implantable Pulse Generator Implant Date: 20211102
Lead Channel Impedance Value: 342 Ohm
Lead Channel Impedance Value: 399 Ohm
Lead Channel Impedance Value: 456 Ohm
Lead Channel Impedance Value: 456 Ohm
Lead Channel Pacing Threshold Amplitude: 0.5 V
Lead Channel Pacing Threshold Amplitude: 1.125 V
Lead Channel Pacing Threshold Pulse Width: 0.4 ms
Lead Channel Pacing Threshold Pulse Width: 0.4 ms
Lead Channel Sensing Intrinsic Amplitude: 23 mV
Lead Channel Sensing Intrinsic Amplitude: 23 mV
Lead Channel Sensing Intrinsic Amplitude: 3.875 mV
Lead Channel Sensing Intrinsic Amplitude: 3.875 mV
Lead Channel Setting Pacing Amplitude: 1.5 V
Lead Channel Setting Pacing Amplitude: 2.25 V
Lead Channel Setting Pacing Pulse Width: 0.4 ms
Lead Channel Setting Sensing Sensitivity: 1.2 mV

## 2021-02-12 NOTE — Telephone Encounter (Signed)
Pt wife stated jaffe told her he didn't think he needed a follow up but she is concerned. she has seen a difference in him and she would like to see jaffe to cover all bases. Please let her know if jaffe will see him. [phone]

## 2021-02-13 NOTE — Telephone Encounter (Signed)
Tried calling pt wife back, No answer. Lmovm to call the office back.  What differences are you seeing in your husband Are they new symptoms that he is experiencing.   Have you discussed this with the patient's PCP.

## 2021-02-13 NOTE — Telephone Encounter (Signed)
Wife called back regarding Frank [name]. She said its hard for to say exactly what is wrong with him, she just knows he is changing. He has fallen out of wheelchair twice and EMS has to be called. He cut himself once. He is making bad decisions, and she understands his issues, but she said its getting worse and she doesn't know how to handle it. Its like having a child, knowing something is wrong but cant pinpoint. He doesn't even remember what he is wearing. He will try to get out of wheelchair thinking he can walk and fall. Yes, the PCP knows and they are going to see him soon.

## 2021-02-13 NOTE — Telephone Encounter (Signed)
Per Dr.Jaffe please keep appt with the PCP, if they will warrant to see Dr.Jaffe then we will order a neruopsych test before he sees Dr.Jaffe.    LMoVm for Wife

## 2021-02-18 ENCOUNTER — Other Ambulatory Visit: Payer: Self-pay | Admitting: Internal Medicine

## 2021-02-20 DIAGNOSIS — N1832 Chronic kidney disease, stage 3b: Secondary | ICD-10-CM | POA: Diagnosis not present

## 2021-02-20 DIAGNOSIS — G4733 Obstructive sleep apnea (adult) (pediatric): Secondary | ICD-10-CM | POA: Diagnosis not present

## 2021-02-20 DIAGNOSIS — R1310 Dysphagia, unspecified: Secondary | ICD-10-CM | POA: Diagnosis not present

## 2021-02-20 DIAGNOSIS — S61512D Laceration without foreign body of left wrist, subsequent encounter: Secondary | ICD-10-CM | POA: Diagnosis not present

## 2021-02-20 DIAGNOSIS — Z9181 History of falling: Secondary | ICD-10-CM | POA: Diagnosis not present

## 2021-02-20 DIAGNOSIS — E785 Hyperlipidemia, unspecified: Secondary | ICD-10-CM | POA: Diagnosis not present

## 2021-02-20 DIAGNOSIS — I69398 Other sequelae of cerebral infarction: Secondary | ICD-10-CM | POA: Diagnosis not present

## 2021-02-20 DIAGNOSIS — I2510 Atherosclerotic heart disease of native coronary artery without angina pectoris: Secondary | ICD-10-CM | POA: Diagnosis not present

## 2021-02-20 DIAGNOSIS — Z950 Presence of cardiac pacemaker: Secondary | ICD-10-CM | POA: Diagnosis not present

## 2021-02-20 DIAGNOSIS — M6281 Muscle weakness (generalized): Secondary | ICD-10-CM | POA: Diagnosis not present

## 2021-02-20 DIAGNOSIS — I129 Hypertensive chronic kidney disease with stage 1 through stage 4 chronic kidney disease, or unspecified chronic kidney disease: Secondary | ICD-10-CM | POA: Diagnosis not present

## 2021-02-20 DIAGNOSIS — D631 Anemia in chronic kidney disease: Secondary | ICD-10-CM | POA: Diagnosis not present

## 2021-02-20 DIAGNOSIS — E1122 Type 2 diabetes mellitus with diabetic chronic kidney disease: Secondary | ICD-10-CM | POA: Diagnosis not present

## 2021-02-20 DIAGNOSIS — Z87440 Personal history of urinary (tract) infections: Secondary | ICD-10-CM | POA: Diagnosis not present

## 2021-02-20 DIAGNOSIS — Z7982 Long term (current) use of aspirin: Secondary | ICD-10-CM | POA: Diagnosis not present

## 2021-02-20 DIAGNOSIS — Z794 Long term (current) use of insulin: Secondary | ICD-10-CM | POA: Diagnosis not present

## 2021-02-20 DIAGNOSIS — Z7984 Long term (current) use of oral hypoglycemic drugs: Secondary | ICD-10-CM | POA: Diagnosis not present

## 2021-02-26 ENCOUNTER — Other Ambulatory Visit: Payer: Self-pay | Admitting: Internal Medicine

## 2021-02-26 DIAGNOSIS — I10 Essential (primary) hypertension: Secondary | ICD-10-CM

## 2021-02-26 DIAGNOSIS — E1142 Type 2 diabetes mellitus with diabetic polyneuropathy: Secondary | ICD-10-CM

## 2021-03-04 ENCOUNTER — Other Ambulatory Visit (HOSPITAL_COMMUNITY): Payer: Self-pay | Admitting: Interventional Radiology

## 2021-03-04 DIAGNOSIS — I6601 Occlusion and stenosis of right middle cerebral artery: Secondary | ICD-10-CM

## 2021-03-07 ENCOUNTER — Other Ambulatory Visit (HOSPITAL_COMMUNITY): Payer: Self-pay | Admitting: Radiology

## 2021-03-07 ENCOUNTER — Other Ambulatory Visit: Payer: Self-pay

## 2021-03-07 ENCOUNTER — Ambulatory Visit (HOSPITAL_COMMUNITY)
Admission: RE | Admit: 2021-03-07 | Discharge: 2021-03-07 | Disposition: A | Discharge status: Home / Self Care | Payer: Medicare Other | Source: Ambulatory Visit | Attending: Interventional Radiology | Admitting: Interventional Radiology

## 2021-03-07 DIAGNOSIS — Z79899 Other long term (current) drug therapy: Secondary | ICD-10-CM | POA: Diagnosis not present

## 2021-03-07 DIAGNOSIS — Z7982 Long term (current) use of aspirin: Secondary | ICD-10-CM | POA: Diagnosis not present

## 2021-03-07 DIAGNOSIS — E119 Type 2 diabetes mellitus without complications: Secondary | ICD-10-CM | POA: Insufficient documentation

## 2021-03-07 DIAGNOSIS — Z7902 Long term (current) use of antithrombotics/antiplatelets: Secondary | ICD-10-CM | POA: Insufficient documentation

## 2021-03-07 DIAGNOSIS — Z888 Allergy status to other drugs, medicaments and biological substances status: Secondary | ICD-10-CM | POA: Insufficient documentation

## 2021-03-07 DIAGNOSIS — Z7984 Long term (current) use of oral hypoglycemic drugs: Secondary | ICD-10-CM | POA: Insufficient documentation

## 2021-03-07 DIAGNOSIS — G4733 Obstructive sleep apnea (adult) (pediatric): Secondary | ICD-10-CM | POA: Diagnosis not present

## 2021-03-07 DIAGNOSIS — Z794 Long term (current) use of insulin: Secondary | ICD-10-CM | POA: Diagnosis not present

## 2021-03-07 DIAGNOSIS — I63512 Cerebral infarction due to unspecified occlusion or stenosis of left middle cerebral artery: Secondary | ICD-10-CM

## 2021-03-07 DIAGNOSIS — Z880 Allergy status to penicillin: Secondary | ICD-10-CM | POA: Diagnosis not present

## 2021-03-07 DIAGNOSIS — I6601 Occlusion and stenosis of right middle cerebral artery: Secondary | ICD-10-CM | POA: Insufficient documentation

## 2021-03-07 DIAGNOSIS — I63231 Cerebral infarction due to unspecified occlusion or stenosis of right carotid arteries: Secondary | ICD-10-CM | POA: Diagnosis not present

## 2021-03-07 DIAGNOSIS — Z882 Allergy status to sulfonamides status: Secondary | ICD-10-CM | POA: Insufficient documentation

## 2021-03-07 DIAGNOSIS — I10 Essential (primary) hypertension: Secondary | ICD-10-CM | POA: Insufficient documentation

## 2021-03-10 HISTORY — PX: IR RADIOLOGIST EVAL & MGMT: IMG5224

## 2021-03-10 LAB — PLATELET INHIBITION P2Y12

## 2021-03-11 NOTE — Progress Notes (Signed)
Remote pacemaker transmission.   

## 2021-03-12 ENCOUNTER — Other Ambulatory Visit: Payer: Self-pay | Admitting: Internal Medicine

## 2021-03-13 ENCOUNTER — Telehealth: Payer: Self-pay | Admitting: Pharmacist

## 2021-03-13 NOTE — Progress Notes (Addendum)
    Chronic Care Management Pharmacy Assistant   Name: Frank [name] MRN: [id number] DOB: 02/26/1941  Reason for Encounter: Disease State - General Adherence  Recent office visits:  02/10/21 Ronnald Ramp (PCP) - Anemia  Diabetes. No med changes.  Recent consult visits:  01/06/21 Tomi Likens (Neurology) - Stroke like symptoms. D/c Benzonatate, Robitussin, Flonase, Hydrocortisone, Claritin & Senokot.  Hospital visits:  None in previous 6 months  Medications: Outpatient Encounter Medications as of 03/13/2021  Medication Sig Note   amLODipine (NORVASC) 10 MG tablet Take 1 tablet (10 mg total) by mouth daily.    aspirin 81 MG chewable tablet Chew 1 tablet (81 mg total) by mouth daily.    atorvastatin (LIPITOR) 10 MG tablet Take 1 tablet (10 mg total) by mouth daily.    cloNIDine (CATAPRES - DOSED IN MG/24 HR) 0.1 mg/24hr patch Place 1 patch (0.1 mg total) onto the skin once a week. 01/03/2021: On hold as of 01/02/21 due to borderline low BP. Pt is monitoring BP closely on just amlodipine.   Ferric Maltol (ACCRUFER) 30 MG CAPS Take 1 capsule by mouth in the morning and at bedtime. (Patient taking differently: Take 30 mg by mouth in the morning and at bedtime.)    FLUoxetine (PROZAC) 20 MG capsule TAKE 1 CAPSULE BY MOUTH  DAILY    glipiZIDE (GLUCOTROL XL) 10 MG 24 hr tablet TAKE 1 TABLET BY MOUTH  DAILY    Glucagon (GVOKE HYPOPEN 2-PACK) 1 MG/0.2ML SOAJ Inject 1 Act into the skin daily as needed. (Patient taking differently: Inject 1 each into the skin as needed (low blood sugar).)    insulin glargine, 2 Unit Dial, (TOUJEO MAX SOLOSTAR) 300 UNIT/ML Solostar Pen Inject 10 Units into the skin daily.    Insulin Pen Needle 32G X 6 MM MISC 1 Act by Does not apply route daily.    levETIRAcetam (KEPPRA) 500 MG tablet Take 1 tablet (500 mg total) by mouth 2 (two) times daily.    OVER THE COUNTER MEDICATION Take 120 mLs by mouth in the morning and at bedtime. Med pass 2.0    pantoprazole (PROTONIX) 40 MG tablet  TAKE 1 TABLET BY MOUTH  DAILY    PRESCRIPTION MEDICATION CPAP- At bedtime    solifenacin (VESICARE) 10 MG tablet Take 1 tablet (10 mg total) by mouth daily.    ticagrelor (BRILINTA) 90 MG TABS tablet Take 0.5 tablets (45 mg total) by mouth 2 (two) times daily.    No facility-administered encounter medications on file as of 03/13/2021.    Attempted contact with [name] 3 times on 9/8, 9/12 & 9/13. Unsuccessful outreach, will attempt again next month.    [facility]: Atorvastatin - last fill 09/25/20 45D?  Orinda Kenner, [place] Clinical Pharmacists Assistant [phone]

## 2021-03-15 ENCOUNTER — Other Ambulatory Visit: Payer: Self-pay | Admitting: Internal Medicine

## 2021-03-15 DIAGNOSIS — N3281 Overactive bladder: Secondary | ICD-10-CM

## 2021-03-19 ENCOUNTER — Telehealth (HOSPITAL_COMMUNITY): Payer: Self-pay

## 2021-03-20 ENCOUNTER — Other Ambulatory Visit (HOSPITAL_COMMUNITY): Payer: Self-pay | Admitting: Interventional Radiology

## 2021-03-20 DIAGNOSIS — I771 Stricture of artery: Secondary | ICD-10-CM

## 2021-03-20 DIAGNOSIS — C44629 Squamous cell carcinoma of skin of left upper limb, including shoulder: Secondary | ICD-10-CM | POA: Diagnosis not present

## 2021-03-25 ENCOUNTER — Ambulatory Visit (INDEPENDENT_AMBULATORY_CARE_PROVIDER_SITE_OTHER): Payer: Medicare Other | Admitting: Internal Medicine

## 2021-03-25 ENCOUNTER — Other Ambulatory Visit: Payer: Self-pay

## 2021-03-25 ENCOUNTER — Encounter: Payer: Self-pay | Admitting: Internal Medicine

## 2021-03-25 VITALS — BP 130/64 | HR 72 | Temp 98.0°F | Ht 69.0 in

## 2021-03-25 DIAGNOSIS — I10 Essential (primary) hypertension: Secondary | ICD-10-CM

## 2021-03-25 DIAGNOSIS — D510 Vitamin B12 deficiency anemia due to intrinsic factor deficiency: Secondary | ICD-10-CM | POA: Diagnosis not present

## 2021-03-25 DIAGNOSIS — L03114 Cellulitis of left upper limb: Secondary | ICD-10-CM | POA: Diagnosis not present

## 2021-03-25 DIAGNOSIS — E1142 Type 2 diabetes mellitus with diabetic polyneuropathy: Secondary | ICD-10-CM

## 2021-03-25 HISTORY — DX: Cellulitis of left upper limb: L03.114

## 2021-03-25 MED ORDER — AMOXICILLIN-POT CLAVULANATE 875-125 MG PO TABS: 1.0000 | ORAL_TABLET | Freq: Two times a day (BID) | ORAL | 0 refills | Status: DC

## 2021-03-25 MED ORDER — CEFTRIAXONE SODIUM 1 G IJ SOLR
1.0000 g | Freq: Once | INTRAMUSCULAR | Status: AC
  Administered 2021-03-25: 1 g via INTRAMUSCULAR

## 2021-03-25 MED ORDER — CYANOCOBALAMIN 1000 MCG/ML IJ SOLN
1000.0000 ug | Freq: Once | INTRAMUSCULAR | Status: AC
  Administered 2021-03-25: 1000 ug via INTRAMUSCULAR

## 2021-03-25 NOTE — Assessment & Plan Note (Signed)
Mild to mod, for antibx course, but for ED if any worsening in the next 1-2 days, to f/u any worsening symptoms or concerns

## 2021-03-25 NOTE — Assessment & Plan Note (Signed)
For b12 today IM 

## 2021-03-25 NOTE — Progress Notes (Signed)
Patient ID: Armari Point, male   DOB: 20-Jun-1941, 80 y.o.   MRN: [id number]        Chief Complaint: follow up left hand and thumb pain       HPI:  Frank [name] is a 80 y.o. male here after post hand lesion removed per derm recently, has bandaid for cover without complication, except shortly thereafter 2 days ago had accidental trip and fell with partial uplifting of the left thumbnail. Bleeding stopped and nail replaced, with another bandaid per family but since then has rapidly worsening red, tender, swelling, pain mostly to the thumb and thenar eminence but now the palm and starting the back of the hand as well, without other abscess, red streaks, ulceration  Pt denies chest pain, increased sob or doe, wheezing, orthopnea, PND, increased LE swelling, palpitations, dizziness or syncope.   Pt denies polydipsia, polyuria, or new focal neuro s/s.  Note pt has PCN allergy listed from childhood but has taken augmentin mar 2022 and tolerated well.        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, Ischemic optic   OSA (obstructive sleep apnea)    cpap   TBI (traumatic brain injury) [name])    Past Surgical History:  Procedure Laterality Date   APPENDECTOMY     arm fracture Left 2011   with hardware   BURR HOLE Bilateral 08/04/2017   Procedure: Haskell Flirt;  Surgeon: [name], MD;  Location: [place];  Service: Neurosurgery;  Laterality: Bilateral;   COLONOSCOPY     CRANIOTOMY Left 12/01/2017   Procedure: Trudee Kuster  Holes CRANIOTOMY HEMATOMA EVACUATION SUBDURAL;  Surgeon: [name], MD;  Location: [place];  Service: Neurosurgery;  Laterality: Left;   ESOPHAGOGASTRODUODENOSCOPY  2006   gastritis   IR PERCUTANEOUS ART THROMBECTOMY/INFUSION INTRACRANIAL INC DIAG ANGIO  09/09/2020   IR RADIOLOGIST EVAL & MGMT  03/10/2021   PACEMAKER IMPLANT N/A 05/14/2020   Procedure: PACEMAKER IMPLANT;  Surgeon: [name], MD;  Location: [place] CV LAB;  Service: Cardiovascular;  Laterality: N/A;   RADIOLOGY WITH ANESTHESIA N/A 09/09/2020   Procedure: IR WITH ANESTHESIA;  Surgeon: Radiologist, Medication, MD;  Location: [place];  Service: Radiology;  Laterality: N/A;   ROTATOR CUFF REPAIR     SEPTOPLASTY  1959   Deviated Septum   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 His smoking use included cigarettes. He smoked an average of 1 pack per day. He has never used smokeless tobacco. He reports current alcohol use of about 2.0 standard drinks per week. He reports that he does not use drugs. family history includes Allergies in his father; Breast cancer in his mother; Coronary artery disease in his brother; Heart disease in his mother; Stroke in his father. Allergies  Allergen Reactions  Lisinopril Cough   Invokana [Canagliflozin] Other (See Comments)    Stopped by MD, unknown reaction    Penicillins Other (See Comments)    Allergic as a child.  Patient does not remember reaction.  Has had cephalasporins without problems.   Sulfamethoxazole Other (See Comments)    Unknown reaction- from childhood   Current Outpatient Medications on File Prior to Visit  Medication Sig Dispense Refill   acetaminophen (TYLENOL) 500 MG tablet Take 500-1,000 mg by mouth every 6 (six) hours as needed for moderate pain or headache.     amLODipine (NORVASC) 10 MG tablet Take 1 tablet (10 mg total) by mouth daily. (Patient taking differently: Take 10 mg by mouth at bedtime.)     aspirin 81 MG  chewable tablet Chew 1 tablet (81 mg total) by mouth daily.     atorvastatin (LIPITOR) 10 MG tablet Take 1 tablet (10 mg total) by mouth daily.     atorvastatin (LIPITOR) 20 MG tablet Take 20 mg by mouth at bedtime.     cloNIDine (CATAPRES - DOSED IN MG/24 HR) 0.1 mg/24hr patch Place 1 patch (0.1 mg total) onto the skin once a week. 12 patch 1   Ferric Maltol (ACCRUFER) 30 MG CAPS Take 1 capsule by mouth in the morning and at bedtime. (Patient taking differently: Take 30 mg by mouth in the morning and at bedtime.) 180 capsule 1   FLUoxetine (PROZAC) 20 MG capsule TAKE 1 CAPSULE BY MOUTH  DAILY 90 capsule 1   fluticasone (FLONASE) 50 MCG/ACT nasal spray Place 1-2 sprays into both nostrils daily as needed for allergies or rhinitis.     glipiZIDE (GLUCOTROL XL) 10 MG 24 hr tablet TAKE 1 TABLET BY MOUTH  DAILY 90 tablet 0   insulin glargine, 2 Unit Dial, (TOUJEO MAX SOLOSTAR) 300 UNIT/ML Solostar Pen Inject 10 Units into the skin daily. 6 mL 1   Insulin Pen Needle 32G X 6 MM MISC 1 Act by Does not apply route daily. 100 each 1   levETIRAcetam (KEPPRA) 500 MG tablet Take 1 tablet (500 mg total) by mouth 2 (two) times daily. 180 tablet 1   pantoprazole (PROTONIX) 40 MG tablet TAKE 1 TABLET BY MOUTH  DAILY 90 tablet 1   PRESCRIPTION MEDICATION CPAP- At bedtime     solifenacin (VESICARE) 10 MG tablet TAKE 1 TABLET BY MOUTH  DAILY 90 tablet 1   ticagrelor (BRILINTA) 90 MG TABS tablet Take 0.5 tablets (45 mg total) by mouth 2 (two) times daily. 180 tablet 0   No current facility-administered medications on file prior to visit.        ROS:  All others reviewed and negative.  Objective        PE:  BP 130/64 (BP Location: Right Arm, Patient Position: Sitting, Cuff Size: Large)   Pulse 72   Temp 98 F (36.7 C) (Oral)   Ht '5\' 9"'$  (1.753 m)   SpO2 95%   BMI 27.17 kg/m                 Constitutional: Pt appears in NAD               HENT: Head: NCAT.                Right Ear: External ear normal.                  Left Ear: External ear normal.  Eyes: . Pupils are equal, round, and reactive to light. Conjunctivae and EOM are normal               Nose: without d/c or deformity               Neck: Neck supple. Gross normal ROM               Cardiovascular: Normal rate and regular rhythm.                 Pulmonary/Chest: Effort normal and breath sounds without rales or wheezing.                Abd:  Soft, NT, ND, + BS, no organomegaly               Neurological: Pt is alert. At baseline orientation, motor grossly intact               Skin: LE edema - trace bilat; left hand with partial nail uplifting but remains intact at the base; left thumb and thenar eminence with warm tender heat sweling 2+ with 1+ same to palm and near partial post hand as well, without frank abscess or fluctuance, o/w neurovasc intact               Psychiatric: Pt behavior is normal without agitation   Micro: none  Cardiac tracings I have personally interpreted today:  none  Pertinent Radiological findings (summarize): none   Lab Results  Component Value Date   WBC 9.2 02/10/2021   HGB 11.0 (L) 02/10/2021   HCT 33.9 (L) 02/10/2021   PLT 205.0 02/10/2021   GLUCOSE 105 (H) 02/10/2021   CHOL 104 11/19/2020   TRIG 91 11/19/2020   HDL 33 (L) 11/19/2020   LDLCALC 53 11/19/2020   ALT 15 12/26/2020   AST 16 12/26/2020   NA 138 02/10/2021   K 4.2 02/10/2021   CL 101 02/10/2021   CREATININE 1.33 02/10/2021   BUN 26 (H) 02/10/2021   CO2 29 02/10/2021   TSH 0.459 11/18/2020   PSA 1.08 07/10/2015   INR 1.2 11/18/2020   HGBA1C 7.7 (H) 02/10/2021   MICROALBUR 67.2 04/23/2020   Assessment/Plan:  Frank [name] is a 81 y.o. White or Caucasian [1] male with  has a past medical history of Allergy, Anemia, Arthritis, AV block, Mobitz 2 (05/14/2020), Diabetes mellitus, GERD (gastroesophageal reflux disease), Hyperlipidemia, Hypertension, Neuropathy (2001), OSA (obstructive sleep apnea), and TBI  (traumatic brain injury) ([place]).  B12 deficiency anemia For b12 today IM  Cellulitis of left hand Mild to mod, for antibx course, but for ED if any worsening in the next 1-2 days, to f/u any worsening symptoms or concerns  Essential hypertension BP Readings from Last 3 Encounters:  03/25/21 130/64  02/10/21 116/60  01/27/21 (!) 110/58   Stable, pt to continue medical treatment norvasc   Type 2 diabetes mellitus with peripheral neuropathy ([name]) Lab Results  Component Value Date   HGBA1C 7.7 (H) 02/10/2021   Mild uncontrolled, pt to continue current medical treatment as declines change glipizde, toujeo  Followup: No follow-ups on file.  Cathlean Cower, MD 03/25/2021 7:25 PM [facility]

## 2021-03-25 NOTE — Patient Instructions (Signed)
Since you can take the penicillin antibiotic (like the amoxil you took in mar 2022), we can give the antibiotic shot today (rocephin)  Please take all new medication as prescribed - the augmentin  Please monitor closely, and even in 1-2 days if not improving, he should go to the ED  Please continue all other medications as before, and refills have been done if requested.  Please have the pharmacy call with any other refills you may need.  Please keep your appointments with your specialists as you may have planned

## 2021-03-25 NOTE — Assessment & Plan Note (Addendum)
Lab Results  Component Value Date   HGBA1C 7.7 (H) 02/10/2021   Mild uncontrolled, pt to continue current medical treatment as declines change glipizde, toujeo

## 2021-03-25 NOTE — Assessment & Plan Note (Signed)
BP Readings from Last 3 Encounters:  03/25/21 130/64  02/10/21 116/60  01/27/21 (!) 110/58   Stable, pt to continue medical treatment norvasc

## 2021-03-26 ENCOUNTER — Inpatient Hospital Stay (HOSPITAL_COMMUNITY)
Admission: EM | Admit: 2021-03-26 | Discharge: 2021-04-03 | DRG: 035 | Disposition: A | Discharge status: Skilled Nursing Facility | Payer: Medicare Other | Attending: Internal Medicine | Admitting: Internal Medicine

## 2021-03-26 ENCOUNTER — Encounter (HOSPITAL_COMMUNITY): Payer: Self-pay | Admitting: Emergency Medicine

## 2021-03-26 ENCOUNTER — Emergency Department (HOSPITAL_COMMUNITY): Payer: Medicare Other

## 2021-03-26 ENCOUNTER — Inpatient Hospital Stay (HOSPITAL_COMMUNITY): Payer: Medicare Other

## 2021-03-26 ENCOUNTER — Ambulatory Visit: Payer: Medicare Other

## 2021-03-26 DIAGNOSIS — Z794 Long term (current) use of insulin: Secondary | ICD-10-CM

## 2021-03-26 DIAGNOSIS — L089 Local infection of the skin and subcutaneous tissue, unspecified: Secondary | ICD-10-CM

## 2021-03-26 DIAGNOSIS — R410 Disorientation, unspecified: Secondary | ICD-10-CM

## 2021-03-26 DIAGNOSIS — Z23 Encounter for immunization: Secondary | ICD-10-CM

## 2021-03-26 DIAGNOSIS — I10 Essential (primary) hypertension: Secondary | ICD-10-CM | POA: Diagnosis present

## 2021-03-26 DIAGNOSIS — Z7982 Long term (current) use of aspirin: Secondary | ICD-10-CM

## 2021-03-26 DIAGNOSIS — I129 Hypertensive chronic kidney disease with stage 1 through stage 4 chronic kidney disease, or unspecified chronic kidney disease: Secondary | ICD-10-CM | POA: Diagnosis present

## 2021-03-26 DIAGNOSIS — Z8249 Family history of ischemic heart disease and other diseases of the circulatory system: Secondary | ICD-10-CM

## 2021-03-26 DIAGNOSIS — K219 Gastro-esophageal reflux disease without esophagitis: Secondary | ICD-10-CM | POA: Diagnosis present

## 2021-03-26 DIAGNOSIS — Z7902 Long term (current) use of antithrombotics/antiplatelets: Secondary | ICD-10-CM

## 2021-03-26 DIAGNOSIS — Z66 Do not resuscitate: Secondary | ICD-10-CM | POA: Diagnosis present

## 2021-03-26 DIAGNOSIS — I63239 Cerebral infarction due to unspecified occlusion or stenosis of unspecified carotid arteries: Secondary | ICD-10-CM | POA: Diagnosis present

## 2021-03-26 DIAGNOSIS — Z950 Presence of cardiac pacemaker: Secondary | ICD-10-CM

## 2021-03-26 DIAGNOSIS — Z882 Allergy status to sulfonamides status: Secondary | ICD-10-CM

## 2021-03-26 DIAGNOSIS — L03818 Cellulitis of other sites: Secondary | ICD-10-CM

## 2021-03-26 DIAGNOSIS — E785 Hyperlipidemia, unspecified: Secondary | ICD-10-CM | POA: Diagnosis present

## 2021-03-26 DIAGNOSIS — Z7984 Long term (current) use of oral hypoglycemic drugs: Secondary | ICD-10-CM

## 2021-03-26 DIAGNOSIS — L03114 Cellulitis of left upper limb: Secondary | ICD-10-CM | POA: Diagnosis present

## 2021-03-26 DIAGNOSIS — E538 Deficiency of other specified B group vitamins: Secondary | ICD-10-CM | POA: Diagnosis present

## 2021-03-26 DIAGNOSIS — E1142 Type 2 diabetes mellitus with diabetic polyneuropathy: Secondary | ICD-10-CM | POA: Diagnosis present

## 2021-03-26 DIAGNOSIS — Z87891 Personal history of nicotine dependence: Secondary | ICD-10-CM

## 2021-03-26 DIAGNOSIS — N183 Chronic kidney disease, stage 3 unspecified: Secondary | ICD-10-CM | POA: Diagnosis present

## 2021-03-26 DIAGNOSIS — R296 Repeated falls: Secondary | ICD-10-CM | POA: Diagnosis present

## 2021-03-26 DIAGNOSIS — G4730 Sleep apnea, unspecified: Secondary | ICD-10-CM | POA: Diagnosis present

## 2021-03-26 DIAGNOSIS — G4733 Obstructive sleep apnea (adult) (pediatric): Secondary | ICD-10-CM | POA: Diagnosis present

## 2021-03-26 DIAGNOSIS — I6521 Occlusion and stenosis of right carotid artery: Secondary | ICD-10-CM | POA: Diagnosis not present

## 2021-03-26 DIAGNOSIS — M1990 Unspecified osteoarthritis, unspecified site: Secondary | ICD-10-CM | POA: Diagnosis present

## 2021-03-26 DIAGNOSIS — Z803 Family history of malignant neoplasm of breast: Secondary | ICD-10-CM

## 2021-03-26 DIAGNOSIS — Z880 Allergy status to penicillin: Secondary | ICD-10-CM

## 2021-03-26 DIAGNOSIS — F0280 Dementia in other diseases classified elsewhere without behavioral disturbance: Secondary | ICD-10-CM | POA: Diagnosis present

## 2021-03-26 DIAGNOSIS — I771 Stricture of artery: Secondary | ICD-10-CM

## 2021-03-26 DIAGNOSIS — Z888 Allergy status to other drugs, medicaments and biological substances status: Secondary | ICD-10-CM

## 2021-03-26 DIAGNOSIS — R531 Weakness: Secondary | ICD-10-CM

## 2021-03-26 DIAGNOSIS — I2510 Atherosclerotic heart disease of native coronary artery without angina pectoris: Secondary | ICD-10-CM | POA: Diagnosis present

## 2021-03-26 DIAGNOSIS — Z006 Encounter for examination for normal comparison and control in clinical research program: Secondary | ICD-10-CM

## 2021-03-26 DIAGNOSIS — Z20822 Contact with and (suspected) exposure to covid-19: Secondary | ICD-10-CM | POA: Diagnosis present

## 2021-03-26 DIAGNOSIS — E1122 Type 2 diabetes mellitus with diabetic chronic kidney disease: Secondary | ICD-10-CM | POA: Diagnosis present

## 2021-03-26 DIAGNOSIS — Z79899 Other long term (current) drug therapy: Secondary | ICD-10-CM

## 2021-03-26 DIAGNOSIS — Z8673 Personal history of transient ischemic attack (TIA), and cerebral infarction without residual deficits: Secondary | ICD-10-CM

## 2021-03-26 DIAGNOSIS — Z823 Family history of stroke: Secondary | ICD-10-CM

## 2021-03-26 HISTORY — DX: Local infection of the skin and subcutaneous tissue, unspecified: L08.9

## 2021-03-26 HISTORY — DX: Presence of cardiac pacemaker: Z95.0

## 2021-03-26 LAB — CBC WITH DIFFERENTIAL/PLATELET
Abs Immature Granulocytes: 0.04 10*3/uL (ref 0.00–0.07)
Basophils Absolute: 0.1 10*3/uL (ref 0.0–0.1)
Basophils Relative: 1 %
Eosinophils Absolute: 0.3 10*3/uL (ref 0.0–0.5)
Eosinophils Relative: 3 %
HCT: 35 % — ABNORMAL LOW (ref 39.0–52.0)
Hemoglobin: 11 g/dL — ABNORMAL LOW (ref 13.0–17.0)
Immature Granulocytes: 0 %
Lymphocytes Relative: 10 %
Lymphs Abs: 1.1 10*3/uL (ref 0.7–4.0)
MCH: 27.5 pg (ref 26.0–34.0)
MCHC: 31.4 g/dL (ref 30.0–36.0)
MCV: 87.5 fL (ref 80.0–100.0)
Monocytes Absolute: 0.9 10*3/uL (ref 0.1–1.0)
Monocytes Relative: 8 %
Neutro Abs: 8.6 10*3/uL — ABNORMAL HIGH (ref 1.7–7.7)
Neutrophils Relative %: 78 %
Platelets: 180 10*3/uL (ref 150–400)
RBC: 4 MIL/uL — ABNORMAL LOW (ref 4.22–5.81)
RDW: 14.5 % (ref 11.5–15.5)
WBC: 11 10*3/uL — ABNORMAL HIGH (ref 4.0–10.5)
nRBC: 0 % (ref 0.0–0.2)

## 2021-03-26 LAB — COMPREHENSIVE METABOLIC PANEL
ALT: 29 U/L (ref 0–44)
AST: 24 U/L (ref 15–41)
Albumin: 3 g/dL — ABNORMAL LOW (ref 3.5–5.0)
Alkaline Phosphatase: 77 U/L (ref 38–126)
Anion gap: 8 (ref 5–15)
BUN: 29 mg/dL — ABNORMAL HIGH (ref 8–23)
CO2: 25 mmol/L (ref 22–32)
Calcium: 8.7 mg/dL — ABNORMAL LOW (ref 8.9–10.3)
Chloride: 102 mmol/L (ref 98–111)
Creatinine, Ser: 1.46 mg/dL — ABNORMAL HIGH (ref 0.61–1.24)
GFR, Estimated: 48 mL/min — ABNORMAL LOW (ref >60)
Glucose, Bld: 181 mg/dL — ABNORMAL HIGH (ref 70–99)
Potassium: 4.5 mmol/L (ref 3.5–5.1)
Sodium: 135 mmol/L (ref 135–145)
Total Bilirubin: 0.5 mg/dL (ref 0.3–1.2)
Total Protein: 6.2 g/dL — ABNORMAL LOW (ref 6.5–8.1)

## 2021-03-26 LAB — RESP PANEL BY RT-PCR (FLU A&B, COVID) ARPGX2
Influenza A by PCR: NEGATIVE
Influenza B by PCR: NEGATIVE
SARS Coronavirus 2 by RT PCR: NEGATIVE

## 2021-03-26 LAB — URINALYSIS, ROUTINE W REFLEX MICROSCOPIC
Bilirubin Urine: NEGATIVE
Glucose, UA: NEGATIVE mg/dL
Ketones, ur: NEGATIVE mg/dL
Nitrite: NEGATIVE
Protein, ur: 30 mg/dL — AB
Specific Gravity, Urine: 1.02 (ref 1.005–1.030)
pH: 7 (ref 5.0–8.0)

## 2021-03-26 LAB — URINALYSIS, MICROSCOPIC (REFLEX)
Squamous Epithelial / HPF: NONE SEEN (ref 0–5)
WBC, UA: >50 WBC/hpf (ref 0–5)

## 2021-03-26 LAB — LACTIC ACID, PLASMA
Lactic Acid, Venous: 1.1 mmol/L (ref 0.5–1.9)
Lactic Acid, Venous: 1.3 mmol/L (ref 0.5–1.9)

## 2021-03-26 LAB — CBG MONITORING, ED: Glucose-Capillary: 106 mg/dL — ABNORMAL HIGH (ref 70–99)

## 2021-03-26 MED ORDER — ACETAMINOPHEN 325 MG PO TABS: 650.0000 mg | ORAL_TABLET | Freq: Four times a day (QID) | ORAL | Status: DC | PRN

## 2021-03-26 MED ORDER — ONDANSETRON HCL 4 MG PO TABS: 4.0000 mg | ORAL_TABLET | Freq: Four times a day (QID) | ORAL | Status: DC | PRN

## 2021-03-26 MED ORDER — DARIFENACIN HYDROBROMIDE ER 15 MG PO TB24
15.0000 mg | ORAL_TABLET | Freq: Every day | ORAL | Status: DC
  Administered 2021-03-27 – 2021-04-03 (×7): 15 mg via ORAL
  Filled 2021-03-26 (×8): qty 1

## 2021-03-26 MED ORDER — VANCOMYCIN HCL IN DEXTROSE 1-5 GM/200ML-% IV SOLN: 1000.0000 mg | INTRAVENOUS | Status: DC

## 2021-03-26 MED ORDER — LEVETIRACETAM 500 MG PO TABS
500.0000 mg | ORAL_TABLET | Freq: Two times a day (BID) | ORAL | Status: DC
  Administered 2021-03-26 – 2021-04-03 (×16): 500 mg via ORAL
  Filled 2021-03-26 (×16): qty 1

## 2021-03-26 MED ORDER — TICAGRELOR 90 MG PO TABS
45.0000 mg | ORAL_TABLET | Freq: Two times a day (BID) | ORAL | Status: DC
  Administered 2021-03-26 – 2021-03-31 (×10): 45 mg via ORAL
  Filled 2021-03-26 (×11): qty 1

## 2021-03-26 MED ORDER — INSULIN GLARGINE (2 UNIT DIAL) 300 UNIT/ML ~~LOC~~ SOPN: 10.0000 [IU] | PEN_INJECTOR | Freq: Every day | SUBCUTANEOUS | Status: DC

## 2021-03-26 MED ORDER — ACETAMINOPHEN 650 MG RE SUPP: 650.0000 mg | Freq: Four times a day (QID) | RECTAL | Status: DC | PRN

## 2021-03-26 MED ORDER — FLUOXETINE HCL 20 MG PO CAPS
20.0000 mg | ORAL_CAPSULE | Freq: Every day | ORAL | Status: DC
  Administered 2021-03-27 – 2021-04-03 (×7): 20 mg via ORAL
  Filled 2021-03-26 (×7): qty 1

## 2021-03-26 MED ORDER — MORPHINE SULFATE (PF) 2 MG/ML IV SOLN: 2.0000 mg | INTRAVENOUS | Status: DC | PRN

## 2021-03-26 MED ORDER — INSULIN ASPART 100 UNIT/ML IJ SOLN
0.0000 [IU] | Freq: Three times a day (TID) | INTRAMUSCULAR | Status: DC
  Administered 2021-03-27: 2 [IU] via SUBCUTANEOUS
  Administered 2021-03-27: 5 [IU] via SUBCUTANEOUS
  Administered 2021-03-28: 2 [IU] via SUBCUTANEOUS
  Administered 2021-03-28: 3 [IU] via SUBCUTANEOUS
  Administered 2021-03-29 (×3): 2 [IU] via SUBCUTANEOUS
  Administered 2021-03-30 – 2021-04-01 (×4): 3 [IU] via SUBCUTANEOUS
  Administered 2021-04-02 (×2): 2 [IU] via SUBCUTANEOUS
  Administered 2021-04-02 – 2021-04-03 (×2): 3 [IU] via SUBCUTANEOUS

## 2021-03-26 MED ORDER — VANCOMYCIN HCL 1750 MG/350ML IV SOLN
1750.0000 mg | Freq: Once | INTRAVENOUS | Status: DC
  Administered 2021-03-26: 1750 mg via INTRAVENOUS
  Filled 2021-03-26: qty 350

## 2021-03-26 MED ORDER — INSULIN GLARGINE-YFGN 100 UNIT/ML ~~LOC~~ SOLN
10.0000 [IU] | Freq: Every day | SUBCUTANEOUS | Status: DC
  Administered 2021-03-26 – 2021-03-29 (×4): 10 [IU] via SUBCUTANEOUS
  Filled 2021-03-26 (×5): qty 0.1

## 2021-03-26 MED ORDER — ONDANSETRON HCL 4 MG/2ML IJ SOLN: 4.0000 mg | Freq: Four times a day (QID) | INTRAMUSCULAR | Status: DC | PRN

## 2021-03-26 MED ORDER — POLYETHYLENE GLYCOL 3350 17 G PO PACK: 17.0000 g | PACK | Freq: Every day | ORAL | Status: DC | PRN

## 2021-03-26 MED ORDER — PANTOPRAZOLE SODIUM 40 MG PO TBEC
40.0000 mg | DELAYED_RELEASE_TABLET | Freq: Every day | ORAL | Status: DC
  Administered 2021-03-27 – 2021-04-03 (×7): 40 mg via ORAL
  Filled 2021-03-26 (×7): qty 1

## 2021-03-26 MED ORDER — ATORVASTATIN CALCIUM 10 MG PO TABS
10.0000 mg | ORAL_TABLET | Freq: Every day | ORAL | Status: DC
  Administered 2021-03-27 – 2021-04-03 (×7): 10 mg via ORAL
  Filled 2021-03-26 (×7): qty 1

## 2021-03-26 MED ORDER — ASPIRIN 81 MG PO CHEW
81.0000 mg | CHEWABLE_TABLET | Freq: Every day | ORAL | Status: DC
  Administered 2021-03-27 – 2021-03-31 (×5): 81 mg via ORAL
  Filled 2021-03-26 (×5): qty 1

## 2021-03-26 MED ORDER — AMLODIPINE BESYLATE 10 MG PO TABS
10.0000 mg | ORAL_TABLET | Freq: Every day | ORAL | Status: DC
  Administered 2021-03-26 – 2021-04-02 (×8): 10 mg via ORAL
  Filled 2021-03-26 (×6): qty 1
  Filled 2021-03-26: qty 2
  Filled 2021-03-26: qty 1

## 2021-03-26 MED ORDER — HYDROCODONE-ACETAMINOPHEN 5-325 MG PO TABS
1.0000 | ORAL_TABLET | ORAL | Status: DC | PRN
  Administered 2021-03-30: 1 via ORAL
  Filled 2021-03-26: qty 1

## 2021-03-26 MED ORDER — ENOXAPARIN SODIUM 40 MG/0.4ML IJ SOSY
40.0000 mg | PREFILLED_SYRINGE | INTRAMUSCULAR | Status: DC
  Administered 2021-03-26 – 2021-03-29 (×4): 40 mg via SUBCUTANEOUS
  Filled 2021-03-26 (×4): qty 0.4

## 2021-03-26 MED ORDER — HYDRALAZINE HCL 20 MG/ML IJ SOLN: 5.0000 mg | INTRAMUSCULAR | Status: DC | PRN

## 2021-03-26 MED ORDER — BISACODYL 5 MG PO TBEC: 5.0000 mg | DELAYED_RELEASE_TABLET | Freq: Every day | ORAL | Status: DC | PRN

## 2021-03-26 MED ORDER — CLONIDINE HCL 0.1 MG/24HR TD PTWK
0.1000 mg | MEDICATED_PATCH | TRANSDERMAL | Status: DC
  Filled 2021-03-26: qty 1

## 2021-03-26 MED ORDER — DOCUSATE SODIUM 100 MG PO CAPS
100.0000 mg | ORAL_CAPSULE | Freq: Two times a day (BID) | ORAL | Status: DC
  Administered 2021-03-26 – 2021-04-03 (×15): 100 mg via ORAL
  Filled 2021-03-26 (×15): qty 1

## 2021-03-26 NOTE — ED Provider Notes (Addendum)
[facility] EMERGENCY DEPARTMENT Provider Note   CSN: [id number] Arrival date & time: 03/26/21  0841     History Chief Complaint  Patient presents with   Wound Infection    Frank [name] is a 80 y.o. male with past medical history of HTN, HLD, vitamin B12 deficiency, T2DM with peripheral neuropathy, GERD, who presents to the ED today with complaints of left thumb wound.  History mainly provided by wife who was at bedside.  His wife states that patient sustained a fall on Sunday, and at that time had partial uplifting of the thumbnail and bleeding. He was seen by his PCP yesterday, at which time he was placed on Augmentin and told to follow-up in the ED for any worsening symptoms. His wife thinks that the infection looks worse and may be spreading up his arm, so they presented here today for further evaluation. Wife states that the left thumb was exquisitely tender to palpation this morning and is hot and swollen compared to the contralateral side.  He also had a low-grade fever at home around 99.  She states that he has been more confused since last Tuesday. 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    His wife has also been concerned that the patient has been having increased falls in the past few months (he has sustained about 5 falls since mid June). Wife states he did not hit his head during any of these falls.  He uses a wheelchair to get around the house and has a home aide that comes to the house 5 days a week and at night.  He also is involved with PT twice a week.  Denies any other complaint such as headache, shortness of breath, chest pain, BLE swelling.  Of note, patient is scheduled for stent placement by IR on 09/19.  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, Ischemic optic   OSA (obstructive sleep apnea)    cpap   TBI (traumatic brain injury) [facility])     Patient Active Problem List   Diagnosis Date Noted   Cellulitis of left hand 03/25/2021   Atherosclerosis of aorta ([place]) 02/10/2021   Generalized weakness 12/26/2020   Simple chronic bronchitis ([place]) 11/13/2020   Iron deficiency anemia secondary to inadequate dietary iron intake 11/06/2020   DNR (do not resuscitate) discussion 10/17/2020   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   Middle cerebral artery embolism, 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   AV block, 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 ([place]) 11/28/2019   Chronic renal disease, stage 3, moderately decreased glomerular filtration rate (GFR) between 30-59 mL/min/1.73 square meter ([place]) 11/21/2018   GERD without esophagitis 07/22/2017   Depression    Type 2 diabetes mellitus with peripheral neuropathy ([name])    OAB (overactive bladder) 11/26/2014   Routine general medical examination at a health care facility 04/06/2014   Vitamin D deficiency 07/23/2010   Obstructive sleep apnea 09/13/2008   Hyperlipidemia with target LDL less than 100 07/27/2007  BPH (benign prostatic hyperplasia) 06/21/2007   OPTIC NEUROPATHY, 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   B12 deficiency anemia 04/15/2006   Essential hypertension 04/15/2006   Allergic rhinitis 04/15/2006    Past Surgical History:  Procedure Laterality Date   APPENDECTOMY     arm fracture Left 2011   with hardware   BURR HOLE Bilateral 08/04/2017   Procedure: Haskell Flirt;  Surgeon: [name], MD;  Location: [place];  Service: Neurosurgery;  Laterality: Bilateral;   COLONOSCOPY     CRANIOTOMY Left 12/01/2017   Procedure: Trudee Kuster Holes CRANIOTOMY HEMATOMA EVACUATION SUBDURAL;  Surgeon: [name], MD;  Location: [place];  Service: Neurosurgery;  Laterality: Left;   ESOPHAGOGASTRODUODENOSCOPY  2006   gastritis   IR PERCUTANEOUS ART THROMBECTOMY/INFUSION INTRACRANIAL INC DIAG ANGIO  09/09/2020   IR RADIOLOGIST EVAL & MGMT  03/10/2021   PACEMAKER IMPLANT N/A 05/14/2020   Procedure: PACEMAKER IMPLANT;  Surgeon: [name], MD;  Location: [place] CV LAB;  Service: Cardiovascular;  Laterality: N/A;   RADIOLOGY WITH ANESTHESIA N/A 09/09/2020   Procedure: IR WITH ANESTHESIA;  Surgeon: Radiologist, Medication, MD;  Location: [place];  Service: Radiology;  Laterality: N/A;   ROTATOR CUFF REPAIR     SEPTOPLASTY  1959   Deviated Septum   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       Family History  Problem Relation Age of Onset   Heart disease Mother    Breast cancer Mother    Stroke Father    Allergies Father        Father and children   Coronary artery disease Brother    Diabetes Neg Hx    Colon cancer Neg Hx     Social History   Tobacco Use   Smoking status: Former    Packs/day: 1.00    Types: Cigarettes    Quit date: 07/14/1983    Years since quitting: 37.7   Smokeless tobacco: Never  Vaping Use   Vaping Use: Never used  Substance Use Topics   Alcohol use: Yes    Alcohol/week: 2.0 standard drinks    Types: 2 Standard drinks or equivalent per week    Comment: occassionally   Drug use: No    Home Medications Prior to Admission medications   Medication Sig Start Date End Date Taking? Authorizing Provider  acetaminophen (TYLENOL) 500 MG tablet Take 500-1,000 mg by mouth every 6 (six) hours as needed for moderate pain or headache.    [provider]  amLODipine (NORVASC) 10 MG tablet Take 1 tablet (10 mg total) by mouth daily. Patient taking differently: Take 10 mg by mouth at bedtime. 09/18/20   Briant Cedar, MD  amoxicillin-clavulanate (AUGMENTIN) 875-125 MG tablet Take 1 tablet by mouth 2 (two) times daily. 03/25/21   Biagio Borg, MD  aspirin 81 MG chewable tablet Chew 1  tablet (81 mg total) by mouth daily. 09/18/20   Briant Cedar, MD  atorvastatin (LIPITOR) 10 MG tablet Take 1 tablet (10 mg total) by mouth daily. 09/18/20   Briant Cedar, MD  atorvastatin (LIPITOR) 20 MG tablet Take 20 mg by mouth at bedtime.    [provider]  cloNIDine (CATAPRES - DOSED IN MG/24 HR) 0.1 mg/24hr patch Place 1 patch (0.1 mg total) onto the skin once a week. 11/13/20   Janith Lima, MD  Ferric Maltol (ACCRUFER) 30 MG CAPS Take 1 capsule by mouth in the morning and at bedtime. Patient taking differently: Take 30 mg by mouth in the  morning and at bedtime. 11/06/20   Janith Lima, MD  FLUoxetine (PROZAC) 20 MG capsule TAKE 1 CAPSULE BY MOUTH  DAILY 03/13/21   Janith Lima, MD  fluticasone [facility]) 50 MCG/ACT nasal spray Place 1-2 sprays into both nostrils daily as needed for allergies or rhinitis.    [provider]  glipiZIDE (GLUCOTROL XL) 10 MG 24 hr tablet TAKE 1 TABLET BY MOUTH  DAILY 01/01/21   Janith Lima, MD  insulin glargine, 2 Unit Dial, (TOUJEO MAX SOLOSTAR) 300 UNIT/ML Solostar Pen Inject 10 Units into the skin daily. 01/09/21   Janith Lima, MD  Insulin Pen Needle 32G X 6 MM MISC 1 Act by Does not apply route daily. 04/08/20   Janith Lima, MD  levETIRAcetam (KEPPRA) 500 MG tablet Take 1 tablet (500 mg total) by mouth 2 (two) times daily. 01/19/21   Janith Lima, MD  pantoprazole (PROTONIX) 40 MG tablet TAKE 1 TABLET BY MOUTH  DAILY 02/19/21   Janith Lima, MD  PRESCRIPTION MEDICATION CPAP- At bedtime    [provider]  solifenacin (VESICARE) 10 MG tablet TAKE 1 TABLET BY MOUTH  DAILY 03/16/21   Janith Lima, MD  ticagrelor (BRILINTA) 90 MG TABS tablet Take 0.5 tablets (45 mg total) by mouth 2 (two) times daily. 11/14/20   Janith Lima, MD    Allergies    Lisinopril, Invokana [canagliflozin], Penicillins, and Sulfamethoxazole  Review of Systems   Review of Systems  Constitutional:  Positive for fever.   HENT: Negative.    Eyes: Negative.   Respiratory: Negative.    Cardiovascular: Negative.   Gastrointestinal: Negative.   Endocrine: Negative.   Genitourinary:  Negative for difficulty urinating and dysuria.  Musculoskeletal:  Negative for joint swelling.  Skin:  Positive for wound.  Allergic/Immunologic: Negative.   Neurological:  Positive for weakness. Negative for facial asymmetry and numbness.  Hematological: Negative.   Psychiatric/Behavioral:  Positive for confusion.    Physical Exam Updated Vital Signs BP (!) 148/77   Pulse 81   Temp (!) 97.1 F (36.2 C)   Resp 14   SpO2 99%   Physical Exam Constitutional:      General: He is not in acute distress.    Comments: Elderly male, somnolent at times during exam  HENT:     Head: Normocephalic and atraumatic.  Eyes:     Pupils: Pupils are equal, round, and reactive to light.  Cardiovascular:     Rate and Rhythm: Normal rate and regular rhythm.     Heart sounds: No murmur heard.   No friction rub. No gallop.  Pulmonary:     Effort: Pulmonary effort is normal.     Breath sounds: Normal breath sounds.  Abdominal:     Palpations: Abdomen is soft.     Tenderness: There is no abdominal tenderness.  Musculoskeletal:     Cervical back: Neck supple.     Right lower leg: No edema.     Left lower leg: No edema.  Skin:    Comments: Left hand: There is a small wound on the back of the hand with dried blood and overlying bandage, no signs of active bleeding. Left thumb: Left thumb is warm, erythematous, swollen. Left forearm: There is some ecchymosis, wife comments this is from fall last Sunday.  There is some surrounding erythema over the left forearm which is new per wife.  Neurological:     General: No focal deficit present.  Mental Status: He is alert. Mental status is at baseline.  Psychiatric:        Mood and Affect: Mood normal.        Behavior: Behavior normal.    ED Results / Procedures / Treatments    Labs (all labs ordered are listed, but only abnormal results are displayed) Labs Reviewed  COMPREHENSIVE METABOLIC PANEL - Abnormal; Notable for the following components:      Result Value   Glucose, Bld 181 (*)    BUN 29 (*)    Creatinine, Ser 1.46 (*)    Calcium 8.7 (*)    Total Protein 6.2 (*)    Albumin 3.0 (*)    GFR, Estimated 48 (*)    All other components within normal limits  CBC WITH DIFFERENTIAL/PLATELET - Abnormal; Notable for the following components:   WBC 11.0 (*)    RBC 4.00 (*)    Hemoglobin 11.0 (*)    HCT 35.0 (*)    Neutro Abs 8.6 (*)    All other components within normal limits  CULTURE, BLOOD (ROUTINE X 2)  CULTURE, BLOOD (ROUTINE X 2)  RESP PANEL BY RT-PCR (FLU A&B, COVID) ARPGX2  LACTIC ACID, PLASMA  LACTIC ACID, PLASMA  URINALYSIS, ROUTINE W REFLEX MICROSCOPIC    EKG None  Radiology DG Finger Thumb Left  Result Date: 03/26/2021 CLINICAL DATA:  From infection. EXAM: LEFT THUMB 2+V COMPARISON:  None. FINDINGS: No evidence of fracture. No subluxation or dislocation. No radiopaque soft tissue foreign body. No evidence for bony destruction to suggest osteomyelitis. IMPRESSION: No evidence for bony destruction or radiopaque soft tissue foreign body. Electronically Signed   By: Misty Stanley M.D.   On: 03/26/2021 09:44    Procedures Procedures   Medications Ordered in ED Medications - No data to display  ED Course  I have reviewed the triage vital signs and the nursing notes.  Pertinent labs & imaging results that were available during my care of the patient were reviewed by me and considered in my medical decision making (see chart for details).    MDM Rules/Calculators/A&P                          This is an 80 year old male who is here for increased falls, confusion, and a worsening left thumb wound, placed on Augmentin regimen yesterday. Afebrile, BP ranging from 123456 systolic since arrival. Thumb XR in triage was unremarkable. WBC 11,  Hgb 11, electrolytes wnl, BUN 29, Cr 1.46. lactic acid x2 wnl. Clinical picture consistent with left thumb cellulitis, confusion could be from infection, polypharmacy, metabolic causes. Pt has failed outpatient therapy and continues to be weak and have balance issues per wife. Will admit to the hospitalist service for further workup. Will broaden antibiotics for MRSA coverage with vanc.  400 PM: Blood cultures x2, UA, EKG, COVID ordered.  425 PM: Transitioned to vancomycin per pharmacy  Patient care transferred to Dr. Vanita Panda at the end of my shift. Patient presentation, ED course, and plan of care discussed with review of all pertinent labs and imaging. Please see his/her note for further details regarding further ED course and disposition.   Final Clinical Impression(s) / ED Diagnoses Final diagnoses:  Confusion  Cellulitis of other specified site    Rx / DC Orders ED Discharge Orders     None        Orvis Brill, MD 03/26/21 1626    Orvis Brill, MD 03/26/21  [id number]    Elnora Morrison, MD 03/27/21 [phone]

## 2021-03-26 NOTE — Progress Notes (Signed)
Pharmacy Antibiotic Note  Frank [name] is a 80 y.o. male admitted on 03/26/2021 with cellulitis. He presented 9/13 to his PCP for L thumb infection and was started on Augmentin before presenting to ED 9/14 with worsened infection. Pharmacy has been consulted for vancomycin dosing.  WBC 11, afebrile SCr 1.46 - baseline around 1.5-1.6  Plan: Give vancomycin 1750 mg IV x1 Vancomycin 1000 mg IV every 24 hours.  Goal trough 10-15 mcg/mL.    > eAUC 452, SCr used 1.46, Cmax 28.4 Monitor renal function, CBC, cultures/sensitivities, LOT and de-escalate as able  Temp (24hrs), Avg:97.1 F (36.2 C), Min:97.1 F (36.2 C), Max:97.1 F (36.2 C)  Recent Labs  Lab 03/26/21 0915 03/26/21 1437  WBC 11.0*  --   CREATININE 1.46*  --   LATICACIDVEN 1.1 1.3    CrCl cannot be calculated (Unknown ideal weight.).    Allergies  Allergen Reactions   Lisinopril Cough   Invokana [Canagliflozin] Other (See Comments)    Stopped by MD, unknown reaction    Penicillins Other (See Comments)    Allergic as a child.  Patient does not remember reaction.  Has had cephalasporins without problems.   Sulfamethoxazole Other (See Comments)    Unknown reaction- from childhood    Antimicrobials this admission: Vancomycin 9/14 >>  Dose adjustments this admission: none  Microbiology results: 9/14 BCx: pending  Thank you for allowing pharmacy to be a part of this patient's care.  Laurey Arrow, PharmD PGY1 Pharmacy Resident 03/26/2021  4:41 PM  Please check [email or web address] for unit-specific pharmacy phone numbers.

## 2021-03-26 NOTE — ED Triage Notes (Signed)
Patient here with complaint of left thumb infection that was evaluated by PCP yesterday. Note from PCP state if wound does not improve over the next two days to go to ED for evaluation, patient here this morning for evaluation of thumb.   Patient originally stated when this writer entered the room that he was here for an procedure for a stent placement in IR, paperwork with patient and procedure listed in Epic both indicate the procedure is scheduled for 9/19.

## 2021-03-26 NOTE — H&P (Addendum)
History and Physical    Frank [name] [id number] DOB: 1941/06/25 DOA: 03/26/2021  PCP: Janith Lima, MD Consultants:  Tomi Likens - neurology; Palliative care; Candiss Norse - nephrology; Lovena Le - cardiology; Saintclair Halsted - neurosurgery Patient coming from:  Home - lives with wife; Donald Prose: Wife, [phone], [phone]   Chief Complaint: L thumb infection  HPI: Frank [name] is a 80 y.o. male with medical history significant of DM; HTN; HLD; and OSA on CPAP presenting with L thumb infection.  His wife went out of town last week.  He noticed on Tuesday that he had a growth on his hand; he was seen by Dr. Elvera Lennox and [facility] was removed.  He also looked at the thumb - he had fallen on 9/1 and EMS had to get him off the floor.  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  It looked worse over the weekend.  Yesterday, his hand was swollen and TTP.  They went to the PCP and was given abx injection and PO antibiotic.  This AM, it was more painful, swollen, and he had streaking erythema.    Also, his wife is noticing that she has to prompt him with getting into the car.  He has bene more belligerent.  She wondered if maybe there was another issue, since he has h/o TBI.  He also is due to carotid stent and it is scheduled for 9/19 - need to make sure infection is treated first.  CVA was 2/28 and he had thrombolysis -> hemorrhagic.  Went to rehab and was doing well.  Then he developed UTI and he had to start all over with rehab and was home again on 6/10 with [facility] after.  Ambulates with walker; uses a wheelchair in the house to prevent falls.  Has dementia, this is getting worse.  He is DNR.      ED Course: L thumb wound with cellulitis, placed on Vanc - seen yesterday for this and placed on Augmentin, told to f/u if worse.  Also with increased falls for several months, balance issues, failing outpatient PT.  UA and blood cultures pending.  Review of Systems: As per HPI;  otherwise review of systems reviewed and negative. Very limited due to apparent cognitive impairment.  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, Ischemic optic   OSA (obstructive sleep apnea)    cpap   TBI (traumatic brain injury) [facility])     Past Surgical History:  Procedure Laterality Date   APPENDECTOMY     arm fracture Left 2011   with hardware   BURR HOLE Bilateral 08/04/2017   Procedure: Haskell Flirt;  Surgeon: [name], MD;  Location: [place];  Service: Neurosurgery;  Laterality: Bilateral;   COLONOSCOPY     CRANIOTOMY Left 12/01/2017   Procedure: Trudee Kuster Holes CRANIOTOMY HEMATOMA EVACUATION SUBDURAL;  Surgeon: [name], MD;  Location: [place];  Service: Neurosurgery;  Laterality: Left;   ESOPHAGOGASTRODUODENOSCOPY  2006   gastritis   IR PERCUTANEOUS ART THROMBECTOMY/INFUSION INTRACRANIAL INC DIAG ANGIO  09/09/2020   IR RADIOLOGIST EVAL & MGMT  03/10/2021   PACEMAKER IMPLANT N/A 05/14/2020   Procedure: PACEMAKER IMPLANT;  Surgeon: [name], MD;  Location: [place] CV [facility];  Service: Cardiovascular;  Laterality: N/A;   RADIOLOGY WITH ANESTHESIA N/A 09/09/2020   Procedure: IR WITH ANESTHESIA;  Surgeon: Radiologist, Medication, MD;  Location: [place];  Service: Radiology;  Laterality: N/A;   ROTATOR CUFF REPAIR     SEPTOPLASTY  1959   Deviated Septum   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    Social History   Socioeconomic History   Marital status: Married    Spouse name: Not on file   Number of children: 1   Years of education: Not on file   Highest education level: Not on file  Occupational History   Not on file  Tobacco Use    Smoking status: Former    Packs/day: 1.00    Types: Cigarettes    Quit date: 07/14/1983    Years since quitting: 37.7   Smokeless tobacco: Never  Vaping Use   Vaping Use: Never used  Substance and Sexual Activity   Alcohol use: Yes    Alcohol/week: 2.0 standard drinks    Types: 2 Standard drinks or equivalent per week    Comment: occassionally   Drug use: No   Sexual activity: Never  Other Topics Concern   Not on file  Social History Narrative   Right handed    Lives with wife   Has a caregiver    Social Determinants of Health   Financial Resource Strain: Not on file  Food Insecurity: Not on file  Transportation Needs: Not on file  Physical Activity: Not on file  Stress: Not on file  Social Connections: Not on file  Intimate Partner Violence: Not on file    Allergies  Allergen Reactions   Lisinopril Cough   Invokana [Canagliflozin] Other (See Comments)    Stopped by MD, unknown reaction    Penicillins Other (See Comments)    Allergic as a child.  Patient does not remember reaction.  Has had cephalasporins without problems.   Sulfamethoxazole Other (See Comments)    Unknown reaction- from childhood    Family History  Problem Relation Age of Onset   Heart disease Mother    Breast cancer Mother    Stroke Father    Allergies Father        Father and children   Coronary artery disease Brother    Diabetes Neg Hx    Colon cancer Neg Hx     Prior to Admission medications   Medication Sig Start Date End Date Taking? Authorizing Provider  acetaminophen (TYLENOL) 500 MG tablet Take 500-1,000 mg by mouth every 6 (six) hours as needed for moderate pain or headache.    [provider]  amLODipine (NORVASC) 10 MG tablet Take 1 tablet (10 mg total) by mouth daily. Patient taking differently: Take 10 mg by mouth at bedtime. 09/18/20   Briant Cedar, MD  amoxicillin-clavulanate (AUGMENTIN) 875-125 MG tablet Take 1 tablet by mouth 2 (two) times daily. 03/25/21    Biagio Borg, MD  aspirin 81 MG chewable tablet Chew 1 tablet (81 mg total) by mouth daily. 09/18/20   Briant Cedar, MD  atorvastatin (LIPITOR) 10 MG tablet Take 1 tablet (10 mg total) by mouth daily. 09/18/20   Briant Cedar, MD  atorvastatin (LIPITOR) 20 MG tablet Take 20 mg by mouth at bedtime.    [provider]  cloNIDine (CATAPRES - DOSED IN MG/24 HR) 0.1 mg/24hr patch Place 1 patch (0.1 mg total) onto the skin once a week. 11/13/20   Janith Lima, MD  Ferric Maltol (ACCRUFER) 30 MG CAPS Take 1 capsule by mouth in the morning and at bedtime. Patient taking differently: Take 30 mg by mouth in the morning and at bedtime. 11/06/20   Janith Lima, MD  FLUoxetine (PROZAC) 20 MG capsule TAKE 1 CAPSULE BY MOUTH  DAILY 03/13/21   Janith Lima, MD  fluticasone [facility]) 50 MCG/ACT nasal spray Place 1-2 sprays into both nostrils daily as needed for allergies or rhinitis.    [provider]  glipiZIDE (GLUCOTROL XL) 10 MG 24 hr tablet TAKE 1 TABLET BY MOUTH  DAILY 01/01/21   Janith Lima, MD  insulin glargine, 2 Unit Dial, (TOUJEO MAX SOLOSTAR) 300 UNIT/ML Solostar Pen Inject 10 Units into the skin daily. 01/09/21   Janith Lima, MD  Insulin Pen Needle 32G X 6 MM MISC 1 Act by Does not apply route daily. 04/08/20   Janith Lima, MD  levETIRAcetam (KEPPRA) 500 MG tablet Take 1 tablet (500 mg total) by mouth 2 (two) times daily. 01/19/21   Janith Lima, MD  pantoprazole (PROTONIX) 40 MG tablet TAKE 1 TABLET BY MOUTH  DAILY 02/19/21   Janith Lima, MD  PRESCRIPTION MEDICATION CPAP- At bedtime    [provider]  solifenacin (VESICARE) 10 MG tablet TAKE 1 TABLET BY MOUTH  DAILY 03/16/21   Janith Lima, MD  ticagrelor (BRILINTA) 90 MG TABS tablet Take 0.5 tablets (45 mg total) by mouth 2 (two) times daily. 11/14/20   Janith Lima, MD    Physical Exam: Vitals:   03/26/21 1434 03/26/21 1529 03/26/21 1559 03/26/21 1600  BP: (!) 155/72 (!) 148/77 (!)  162/78   Pulse: 75 81 79   Resp: '15 14 17   '$ Temp:      SpO2: 97% 99% 97%   Weight:    83.5 kg  Height:    '5\' 9"'$  (1.753 m)     General:  Appears calm and comfortable and is in NAD Eyes:  normal lids, iris ENT:  grossly normal hearing, lips & tongue, mmm; some absent dentition Neck:  no LAD, masses or thyromegaly Cardiovascular:  RRR, no m/r/g. No LE edema.  Respiratory:   CTA bilaterally with no wheezes/rales/rhonchi.  Normal respiratory effort. Abdomen:  soft, NT, ND, NABS Back:   normal alignment, no CVAT Skin:  L thumb with partial nail avulsion with surrounding erythema at the base of the nail without apparent streaking erythema noted on exam     Musculoskeletal:  no bony abnormality Psychiatric:  blunted mood and affect, speech sparse but appropriate, appears pleasantly confused/detached Neurologic:  unable to effectively perform    Radiological Exams on Admission: Independently reviewed - see discussion in A/P where applicable  DG Finger Thumb Left  Result Date: 03/26/2021 CLINICAL DATA:  From infection. EXAM: LEFT THUMB 2+V COMPARISON:  None. FINDINGS: No evidence of fracture. No subluxation or dislocation. No radiopaque soft tissue foreign body. No evidence for bony destruction to suggest osteomyelitis. IMPRESSION: No evidence for bony destruction or radiopaque soft tissue foreign body. Electronically Signed   By: Misty Stanley M.D.   On: 03/26/2021 09:44    EKG: Independently reviewed.  Ventricularly paced rhythm with rate 79   Labs on Admission: I have personally reviewed the available labs and imaging studies at the  time of the admission.  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 Caralyn Guile to review the image and advise about further management vs. To consult on the patient in person -He called back and thinks this was traumatic injury and does not think he needs antibiotics at all - will stop Vanc at this time and hold further antibiotics.  Increased falls -Patient with known h/o cognitive impairment associated with TBI and CVA as well as significant waxing and waning generalized weakness due to medical conditions -He has had multiple recent falls -His wife requests a head CT to ensure to bleeding from falls -He appears to be diffusely weak globally and quite deconditioned with generally poor rehab potential -Will order PT/OT evaluations and ST cognitive eval  Dementia, h/o TBI -His wife reports recent belligerence -This may be a progression of his dementia or may be related to medical issues -Consider Seroquel qhs -Continue Prozac -Continue Keppra  Carotid stenosis/CAD -Recent coronary stents, continue ASA  HTN -Continue Catapres patch, Norvasc  HLD -Continue Lipitor  DM -Recent A1c was 7.7 -hold Glucotrol -Continue Toujeo -Cover with moderate-scale SSI   OSA -Continue CPAP  DNR -I have discussed  code status with the patient's wife and the patient would not desire resuscitation and would prefer to die a natural death should that situation arise.     Note: This patient has been tested and is pending for the novel coronavirus COVID-19. The patient has been fully vaccinated against COVID-19.   Level of care: Med-Surg DVT prophylaxis:  Lovenox  Code Status:  DNR - confirmed with family Family Communication: Wife was present throughout evaluation Disposition Plan:  The patient is from: home  Anticipated d/c is to: home without [facility] services once her cardiology issues have been resolved.  Anticipated d/c date will depend on clinical response to treatment, likely 2-3 days  Patient is currently: acutely ill Consults called: Hand surgery (possible telephone only); PT/OT/ST Admission status:  Admit - It is my clinical opinion that admission to INPATIENT is reasonable and necessary because of the expectation that this patient will require hospital care that crosses at least 2 midnights to treat this condition based on the medical complexity of the problems presented.  Given the aforementioned information, the predictability of an adverse outcome is felt to be significant.    Karmen Bongo MD [facility]   How to contact the [facility] Attending or Consulting provider [place] or covering provider during after hours [place], for this patient?  Check  the care team in [facility] and look for a) attending/consulting [place] provider listed and b) the [facility] team listed Log into [email or web address] and use [place]'s universal password to access. If you do not have the password, please contact the hospital operator. Locate the [facility] provider you are looking for under [facility] and page to a number that you can be directly reached. If you still have difficulty reaching the provider, please page the [facility] (Director on Call) for the Hospitalists listed on amion for assistance.   03/26/2021, 5:57 PM

## 2021-03-27 DIAGNOSIS — L089 Local infection of the skin and subcutaneous tissue, unspecified: Secondary | ICD-10-CM | POA: Diagnosis not present

## 2021-03-27 LAB — BASIC METABOLIC PANEL
Anion gap: 7 (ref 5–15)
BUN: 27 mg/dL — ABNORMAL HIGH (ref 8–23)
CO2: 27 mmol/L (ref 22–32)
Calcium: 8.9 mg/dL (ref 8.9–10.3)
Chloride: 103 mmol/L (ref 98–111)
Creatinine, Ser: 1.37 mg/dL — ABNORMAL HIGH (ref 0.61–1.24)
GFR, Estimated: 52 mL/min — ABNORMAL LOW (ref >60)
Glucose, Bld: 95 mg/dL (ref 70–99)
Potassium: 3.9 mmol/L (ref 3.5–5.1)
Sodium: 137 mmol/L (ref 135–145)

## 2021-03-27 LAB — CBC
HCT: 36.5 % — ABNORMAL LOW (ref 39.0–52.0)
Hemoglobin: 11.6 g/dL — ABNORMAL LOW (ref 13.0–17.0)
MCH: 27.2 pg (ref 26.0–34.0)
MCHC: 31.8 g/dL (ref 30.0–36.0)
MCV: 85.5 fL (ref 80.0–100.0)
Platelets: 162 10*3/uL (ref 150–400)
RBC: 4.27 MIL/uL (ref 4.22–5.81)
RDW: 14.4 % (ref 11.5–15.5)
WBC: 8.6 10*3/uL (ref 4.0–10.5)
nRBC: 0 % (ref 0.0–0.2)

## 2021-03-27 LAB — GLUCOSE, CAPILLARY
Glucose-Capillary: 84 mg/dL (ref 70–99)
Glucose-Capillary: 127 mg/dL — ABNORMAL HIGH (ref 70–99)
Glucose-Capillary: 203 mg/dL — ABNORMAL HIGH (ref 70–99)
Glucose-Capillary: 202 mg/dL — ABNORMAL HIGH (ref 70–99)

## 2021-03-27 NOTE — ED Notes (Signed)
Placed patient on a [place] after attempting to use bedpan & condom cath originally.

## 2021-03-27 NOTE — Progress Notes (Signed)
Wife called MD in IR that is to place a stent this coming Monday for pt. She is wanting to know if pt came have procedure done while he is already here instead of having to come back Monday.  Wife stated they DO NOT want to do a SNF. They will do whatever it takes to keep him home with Home Health. They have a good support system.

## 2021-03-27 NOTE — Progress Notes (Signed)
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 Salley Scarlet of dermatology--used Band-Aid to cover had  several accidental trips and fall lifting the left thumbnail-saw PCP was placed on p.o. antibiotic + injection but because of worsening erythema pain etc. brought to ED Dr. Apolonio Schneiders of hand surgery reviewed the images and does not feel antibiotics were needed  Hospital-Problem based course  Trauma to left Thumb Does not require any antibiotics, symptomatic management and potential outpatient follow-up with Hand, Dr. Apolonio Schneiders who gave recommendations to be admitting physician Discontinue morphine-can continue Norco 1-2 every 4 as needed severe pain Frequent falls Unsafe to discharge because of 2 falls recently and mild cognitive impairments Therapy services input pending Strokes in the past Prior large MCA infarct with hemorrhagic conversion 2/28 Continue ticagrelor 45 mg twice daily, aspirin 81 mg, Keppra 500 twice daily [is on blood thinner because RCA stenosis ?]  Needs to be Clarified in the outpatient setting Continue atorvastatin 10 daily Hypertension Continue amlodipine 10 nightly, clonidine patch 0.1 q. 7 days Diabetes mellitus type 2 Glipizide XL 10 mg  held on admission, continue 10 units of long-acting insulin at this time only    DVT prophylaxis: Lovenox Code Status: DNR Family Communication: None present at the bedside Disposition:  Status is: Inpatient  Remains inpatient appropriate because:Hemodynamically unstable, Unsafe d/c plan, and IV treatments appropriate due to intensity of illness or inability to take PO  Dispo: The patient is from: Home              Anticipated d/c is to:  To be determined unclear at this time may need skilled but if is cleared can go home as early as tomorrow with home health              Patient currently is not medically stable to d/c.   Difficult to place patient No       Consultants:  Hand surgeon Dr. Apolonio Schneiders  Procedures:   Antimicrobials:     Subjective: Awake alert coherent not confused pleasant no distress awaiting breakfast   Objective: Vitals:   03/27/21 0049 03/27/21 0200 03/27/21 0243 03/27/21 0351  BP:  125/64 (!) 154/84 118/65  Pulse:  81 88 81  Resp:  (!) '21 17 18  '$ Temp: 98.3 F (36.8 C)  98 F (36.7 C) 99.1 F (37.3 C)  TempSrc: Oral  Oral Oral  SpO2:  95% 97% 94%  Weight:      Height:        Intake/Output Summary (Last 24 hours) at 03/27/2021 0717 Last data filed at 03/26/2021 2124 Gross per 24 hour  Intake 350 ml  Output --  Net 350 ml   Filed Weights   03/26/21 1600  Weight: 83.5 kg    Examination:  EOMI NCAT no focal deficit S1-S2 no murmur no rub no gallop CTA B no added sound no rales  no rhonchi Abdomen soft And examined as below     Data Reviewed: personally reviewed   CBC    Component Value Date/Time   WBC 8.6 03/27/2021 0327   RBC 4.27 03/27/2021 0327   HGB 11.6 (L) 03/27/2021 0327   HGB 10.2 (L) 12/12/2018 1431   HCT 36.5 (L) 03/27/2021 0327   PLT 162 03/27/2021 0327   PLT 160 12/12/2018 1431   MCV 85.5 03/27/2021 0327   MCH 27.2 03/27/2021 0327   MCHC 31.8 03/27/2021 0327   RDW 14.4 03/27/2021 0327   LYMPHSABS 1.1  03/26/2021 0915   MONOABS 0.9 03/26/2021 0915   EOSABS 0.3 03/26/2021 0915   BASOSABS 0.1 03/26/2021 0915   CMP Latest Ref Rng & Units 03/27/2021 03/26/2021 02/10/2021  Glucose 70 - 99 mg/dL 95 181(H) 105(H)  BUN 8 - 23 mg/dL 27(H) 29(H) 26(H)  Creatinine 0.61 - 1.24 mg/dL 1.37(H) 1.46(H) 1.33  Sodium 135 - 145 mmol/L 137 135 138  Potassium 3.5 - 5.1 mmol/L 3.9 4.5 4.2  Chloride 98 - 111 mmol/L 103 102 101  CO2 22 - 32 mmol/L '27 25 29  '$ Calcium 8.9 - 10.3 mg/dL 8.9 8.7(L) 9.1  Total Protein 6.5 - 8.1 g/dL - 6.2(L) -  Total Bilirubin 0.3 - 1.2 mg/dL - 0.5 -  Alkaline Phos 38 - 126 U/L - 77 -  AST 15 - 41 U/L - 24 -  ALT 0 - 44 U/L - 29 -     Radiology Studies: CT HEAD WO CONTRAST (5MM)  Result Date: 03/26/2021 CLINICAL DATA:  Confusion.  History of traumatic brain injury. EXAM: CT HEAD WITHOUT CONTRAST TECHNIQUE: Contiguous axial images were obtained from the base of the skull through the vertex without intravenous contrast. COMPARISON:  None. 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 Right-sided chronic subdural collection is also unchanged. There is unchanged encephalomalacia of the right parietal and temporal lobes. There is periventricular hypoattenuation compatible with chronic microvascular disease. Vascular: Unchanged right MCA branch stent. Skull: Remote right frontal burr hole and left frontoparietal craniectomy. Sinuses/Orbits: No fluid levels or advanced mucosal thickening of the visualized paranasal sinuses. No mastoid or middle ear effusion. The orbits are normal. IMPRESSION: 1. Unchanged bilateral chronic subdural hematomas. No acute abnormality. 2. Unchanged encephalomalacia of the right parietal and temporal lobes. 3. Unchanged right MCA branch stent. Electronically Signed   By: Ulyses Jarred M.D.   On: 03/26/2021 19:02   DG Finger Thumb Left  Result Date: 03/26/2021 CLINICAL DATA:  From infection. EXAM: LEFT THUMB 2+V COMPARISON:  None.  FINDINGS: No evidence of fracture. No subluxation or dislocation. No radiopaque soft tissue foreign body. No evidence for bony destruction to suggest osteomyelitis. IMPRESSION: No evidence for bony destruction or radiopaque soft tissue foreign body. Electronically Signed   By: Misty Stanley M.D.   On: 03/26/2021 09:44     Scheduled Meds:  amLODipine  10 mg Oral QHS   aspirin  81 mg Oral Daily   atorvastatin  10 mg Oral Daily   [START ON 04/01/2021] cloNIDine  0.1 mg Transdermal Weekly   darifenacin  15 mg Oral Daily   docusate sodium  100 mg Oral BID   enoxaparin (LOVENOX) injection  40 mg Subcutaneous Q24H   FLUoxetine  20 mg Oral Daily   insulin aspart  0-15 Units Subcutaneous TID WC   insulin glargine-yfgn  10 Units Subcutaneous QHS   levETIRAcetam  500 mg Oral BID   pantoprazole  40 mg Oral Daily  ticagrelor  45 mg Oral BID   Continuous Infusions:   LOS: 1 day   Time spent: [place], MD [facility] To contact the attending provider between 7A-7P or the covering provider during after hours 7P-7A, please log into the web site [email or web address] and access using universal [place] password for that web site. If you do not have the password, please call the hospital operator.  03/27/2021, 7:17 AM

## 2021-03-27 NOTE — Progress Notes (Signed)
Pt placed on CPAP. Pt is resting comfortably sat 98. RT will cont to monitor.

## 2021-03-27 NOTE — Evaluation (Signed)
Occupational Therapy Evaluation Patient Details Name: Frank [name] MRN: [id number] DOB: March 17, 1941 Today's Date: 03/27/2021   History of Present Illness Pt presented with L thumb infection and h/o recent falls. PMH significant of DM, HTN, HLD, OSA on CPAP, TBI, dementia, and CVA.   Clinical Impression   Pt admitted with the above diagnoses and presents with below problem list. Pt will benefit from continued acute OT to address the below listed deficits and maximize independence with basic ADLs prior to d/c to venue below. At baseline, pt is limited in mobility, uses rw vs w/c for ADLs, some assist with ADLs and functional transfers. Pt presents with generalized weakness and impaired balance. Today, pt needed mod A +2 for stand-pivot EOB to recliner.        Recommendations for follow up therapy are one component of a multi-disciplinary discharge planning process, led by the attending physician.  Recommendations may be updated based on patient status, additional functional criteria and insurance authorization.   Follow Up Recommendations  SNF    Equipment Recommendations  Other (comment) (defer to next venue)    Recommendations for Other Services       Precautions / Restrictions Precautions Precautions: Fall Precaution Comments: h/o recent falls at home Restrictions Weight Bearing Restrictions: No      Mobility Bed Mobility Overal bed mobility: Needs Assistance Bed Mobility: Supine to Sit Rolling: Min assist   Supine to sit: Mod assist;HOB elevated     General bed mobility comments: assist for trunk elevation. Extra time and effort. Some assist to pivot hips to full EOB position.    Transfers Overall transfer level: Needs assistance Equipment used: Rolling walker (2 wheeled) Transfers: Sit to/from [facility] Sit to Stand: Mod assist;+2 physical assistance Stand pivot transfers: Mod assist;+2 physical assistance       General transfer  comment: EOB to recliner. Unsteadiness noted. Seeking external support of side of bed with the backs of his legs in standing. True stand-pivot transfer keeping seat surface behind pt due to instability.    Balance Overall balance assessment: Needs assistance;History of Falls Sitting-balance support: Single extremity supported;No upper extremity supported;Feet supported Sitting balance-Leahy Scale: Fair Sitting balance - Comments: able to take meds while sitting EOB Postural control: Posterior lean Standing balance support: Bilateral upper extremity supported Standing balance-Leahy Scale: Poor Standing balance comment: seeks external support of legs pressed into side of bed in static standing. BUE support.                           ADL either performed or assessed with clinical judgement   ADL Overall ADL's : Needs assistance/impaired Eating/Feeding: Set up;Sitting   Grooming: Minimal assistance;Sitting   Upper Body Bathing: Minimal assistance;Sitting   Lower Body Bathing: Maximal assistance;+2 for physical assistance;Sit to/from stand   Upper Body Dressing : Minimal assistance;Sitting   Lower Body Dressing: Maximal assistance;+2 for physical assistance;Sit to/from stand   Toilet Transfer: Moderate assistance;+2 for physical assistance;Stand-pivot;BSC;RW   Toileting- Clothing Manipulation and Hygiene: Maximal assistance;+2 for physical assistance;Sit to/from stand         General ADL Comments: Pt completed bed mobility, sat EOB a few minutes then stand-pivot trasnfer to recliner.     Vision         Perception     Praxis      Pertinent Vitals/Pain Pain Assessment: No/denies pain     Hand Dominance Right   Extremity/Trunk Assessment Upper Extremity Assessment Upper Extremity Assessment:  Generalized weakness   Lower Extremity Assessment Lower Extremity Assessment: Defer to PT evaluation LLE Deficits / Details: no tone but sits in ER at hip with  supination posture on L foot LLE Coordination: decreased gross motor   Cervical / Trunk Assessment Cervical / Trunk Assessment: Kyphotic (mild)   Communication Communication Communication: No difficulties   Cognition Arousal/Alertness: Awake/alert Behavior During Therapy: WFL for tasks assessed/performed;Flat affect Overall Cognitive Status: History of cognitive impairments - at baseline                                 General Comments: requires direction to stay on task and answer questions   General Comments  Pt is up to side of bed with help and to chair with two mod help.  Pt is very weak and requiring more help than his wife will be able to provide    Exercises     Shoulder Instructions      Home Living Family/patient expects to be discharged to:: Private residence Living Arrangements: Spouse/significant other Available Help at Discharge: Available 24 hours/day;Family;Personal care attendant Type of Home: House Home Access: Stairs to enter     Home Layout: One level     Bathroom Shower/Tub: Occupational psychologist: Handicapped height Bathroom Accessibility: Yes   Home Equipment: Environmental consultant - 2 wheels;Shower seat;Grab bars - tub/shower;Grab bars - toilet;Cane - single point;Walker - 4 wheels;Wheelchair - manual          Prior Functioning/Environment Level of Independence: Independent with assistive device(s);Needs assistance  Gait / Transfers Assistance Needed: rw for transfers, w/c in lieu of functional mobility per pt report. ADL's / Homemaking Assistance Needed: transfers to w/c to access bathroom per pt report   Comments: some conflicting info in pt's verbal report. No family present to provide PLOF. Info also gleamed from chart review.        OT Problem List: Decreased strength;Decreased activity tolerance;Impaired balance (sitting and/or standing);Decreased cognition;Decreased safety awareness;Decreased knowledge of use of DME or  AE;Decreased knowledge of precautions;Impaired tone;Pain      OT Treatment/Interventions: Self-care/ADL training;Therapeutic exercise;DME and/or AE instruction;Therapeutic activities;Patient/family education;Balance training    OT Goals(Current goals can be found in the care plan section) Acute Rehab OT Goals Patient Stated Goal: not stated OT Goal Formulation: With patient Time For Goal Achievement: 04/10/21 Potential to Achieve Goals: Good  OT Frequency: Min 2X/week   Barriers to D/C:            Co-evaluation PT/OT/SLP Co-Evaluation/Treatment: Yes Reason for Co-Treatment: To address functional/ADL transfers;For patient/therapist safety   OT goals addressed during session: ADL's and self-care      AM-PAC OT "6 Clicks" Daily Activity     Outcome Measure Help from another person eating meals?: A Little Help from another person taking care of personal grooming?: A Lot Help from another person toileting, which includes using toliet, bedpan, or urinal?: A Lot Help from another person bathing (including washing, rinsing, drying)?: A Lot Help from another person to put on and taking off regular upper body clothing?: A Lot Help from another person to put on and taking off regular lower body clothing?: A Lot 6 Click Score: 13   End of Session Equipment Utilized During Treatment: Gait belt;Rolling walker Nurse Communication: Mobility status  Activity Tolerance: Patient tolerated treatment well Patient left: in chair;with call bell/phone within reach;with chair alarm set  OT Visit Diagnosis: Unsteadiness on feet (R26.81);Other abnormalities  of gait and mobility (R26.89);Muscle weakness (generalized) (M62.81);History of falling (Z91.81);Repeated falls (R29.6);Other symptoms and signs involving cognitive function;Other symptoms and signs involving the nervous system (R29.898)                Time: 1001-1025 OT Time Calculation (min): 24 min Charges:  OT General Charges $OT Visit: 1  Visit OT Evaluation $OT Eval Moderate Complexity: [place], OT Acute Rehabilitation Services Pager: [phone] Office: [phone]   Hortencia Pilar 03/27/2021, 1:55 PM

## 2021-03-27 NOTE — Evaluation (Signed)
Physical Therapy Evaluation Patient Details Name: Frank [name] MRN: [id number] DOB: 1940-08-05 Today's Date: 03/27/2021  History of Present Illness  80 yo male was admitted on 9/14 with L thumb infection and h/o recent falls.  Pt had fallen and also had surgery on L thumb for a growth, but progressed to infection.  Pt has also had more belligerent personality per wife recently.  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    Clinical Impression  Pt is assisted by two person help to side of bed and to stand, then to chair with a strong posterior lean in upright posture.  Pt is motivated to work, but demonstrating slow follow through with cues and was giving inconsistent answers to history questions as compared to chart history.   Will work with him to increase strength and consistency of mobility, to increase safety with balance and initiation of all mobility, and to progress to ability to be assisted by one person at home.  Pt is unable to control posterior lean, unable to be assisted with less than two person help and has been off his feet for a time.  Follow for PT goals of acute therapy to go to SNF as recommended at dc.       Recommendations for follow up therapy are one component of a multi-disciplinary discharge planning process, led by the attending physician.  Recommendations may be updated based on patient status, additional functional criteria and insurance authorization.  Follow Up Recommendations SNF    Equipment Recommendations  None recommended by PT    Recommendations for Other Services       Precautions / Restrictions Precautions Precautions: Fall Precaution Comments: h/o recent falls at home Restrictions Weight Bearing Restrictions: No      Mobility  Bed Mobility Overal bed mobility: Needs Assistance Bed Mobility: Rolling;Supine to Sit Rolling: Min assist   Supine to sit:  Mod assist     General bed mobility comments: mod to support trunk and to scoot to EOB    Transfers Overall transfer level: Needs assistance Equipment used: Rolling walker (2 wheeled);2 person hand held assist Transfers: Sit to/from Stand Sit to Stand: Mod assist;+2 physical assistance;+2 safety/equipment;From elevated surface Stand pivot transfers: Mod assist;+2 physical assistance       General transfer comment: Mod to power up with two, mod to support and gain control of initial standing to get to chair, strong posterior lean  Ambulation/Gait             General Gait Details: steps are in sequence to transfer and not functional for gait  Stairs            Wheelchair Mobility    Modified Rankin (Stroke Patients Only)       Balance Overall balance assessment: History of Falls;Needs assistance Sitting-balance support: Feet supported;Single extremity supported Sitting balance-Leahy Scale: Fair Sitting balance - Comments: can use one UE functionally while sitting Postural control: Posterior lean Standing balance support: Bilateral upper extremity supported Standing balance-Leahy Scale: Poor Standing balance comment: requires help to prop legs on side of bed and requires two to get to chair without it                             Pertinent Vitals/Pain Pain Assessment: No/denies pain    Home Living Family/patient expects to be discharged to:: Private residence Living Arrangements:  Spouse/significant other Available Help at Discharge: Available 24 hours/day;Family;Personal care attendant Type of Home: House Home Access: Stairs to enter     Home Layout: One level Home Equipment: Environmental consultant - 2 wheels;Shower seat;Grab bars - tub/shower;Grab bars - toilet;Cane - single point;Walker - 4 wheels;Wheelchair - manual      Prior Function Level of Independence: Independent with assistive device(s);Needs assistance   Gait / Transfers Assistance Needed: RW to  transfer and wc for mobility recently  ADL's / Homemaking Assistance Needed: wc to bathroom  Comments: pt is not matching his chart history     Hand Dominance   Dominant Hand: Right    Extremity/Trunk Assessment   Upper Extremity Assessment Upper Extremity Assessment: Defer to OT evaluation    Lower Extremity Assessment Lower Extremity Assessment: Generalized weakness;LLE deficits/detail LLE Deficits / Details: no tone but sits in ER at hip with supination posture on L foot LLE Coordination: decreased gross motor    Cervical / Trunk Assessment Cervical / Trunk Assessment: Kyphotic (mld)  Communication   Communication: No difficulties  Cognition Arousal/Alertness: Awake/alert Behavior During Therapy: WFL for tasks assessed/performed Overall Cognitive Status: No family/caregiver present to determine baseline cognitive functioning                                 General Comments: requires direction to stay on task and answer questions      General Comments General comments (skin integrity, edema, etc.): Pt is up to side of bed with help and to chair with two mod help.  Pt is very weak and requiring more help than his wife will be able to provide    Exercises     Assessment/Plan    PT Assessment Patient needs continued PT services  PT Problem List Decreased strength;Decreased activity tolerance;Decreased balance;Decreased mobility;Decreased coordination;Decreased knowledge of use of DME;Decreased safety awareness       PT Treatment Interventions DME instruction;Gait training;Functional mobility training;Therapeutic activities;Therapeutic exercise;Balance training;Neuromuscular re-education;Patient/family education;Stair training    PT Goals (Current goals can be found in the Care Plan section)  Acute Rehab PT Goals Patient Stated Goal: to go home with wife PT Goal Formulation: With patient Time For Goal Achievement: 04/10/21 Potential to Achieve Goals:  Good    Frequency Min 2X/week   Barriers to discharge Decreased caregiver support;Inaccessible home environment home with stairs to enter and will need two person help    Co-evaluation PT/OT/SLP Co-Evaluation/Treatment: Yes Reason for Co-Treatment: To address functional/ADL transfers;For patient/therapist safety   OT goals addressed during session: ADL's and self-care       AM-PAC PT "6 Clicks" Mobility  Outcome Measure Help needed turning from your back to your side while in a flat bed without using bedrails?: A Lot Help needed moving from lying on your back to sitting on the side of a flat bed without using bedrails?: A Lot Help needed moving to and from a bed to a chair (including a wheelchair)?: A Lot Help needed standing up from a chair using your arms (e.g., wheelchair or bedside chair)?: A Lot Help needed to walk in hospital room?: Total Help needed climbing 3-5 steps with a railing? : Total 6 Click Score: 10    End of Session Equipment Utilized During Treatment: Gait belt Activity Tolerance: Patient limited by fatigue;Treatment limited secondary to medical complications (Comment) Patient left: in chair;with call bell/phone within reach;with chair alarm set Nurse Communication: Mobility status PT Visit Diagnosis: Unsteadiness on  feet (R26.81);Muscle weakness (generalized) (M62.81);History of falling (Z91.81)    Time: 1000-1031 PT Time Calculation (min) (ACUTE ONLY): 31 min   Charges:   PT Evaluation $PT Eval Moderate Complexity: 1 Mod         Ramond Dial 03/27/2021, 1:28 PM  Mee Hives, PT MS Acute Rehab Dept. Number: [place] [facility] [place]

## 2021-03-28 ENCOUNTER — Other Ambulatory Visit: Payer: Self-pay | Admitting: Physician Assistant

## 2021-03-28 ENCOUNTER — Other Ambulatory Visit: Payer: Self-pay | Admitting: Internal Medicine

## 2021-03-28 ENCOUNTER — Encounter (HOSPITAL_COMMUNITY): Payer: Self-pay | Admitting: Internal Medicine

## 2021-03-28 DIAGNOSIS — Z9181 History of falling: Secondary | ICD-10-CM | POA: Diagnosis not present

## 2021-03-28 DIAGNOSIS — Z823 Family history of stroke: Secondary | ICD-10-CM | POA: Diagnosis not present

## 2021-03-28 DIAGNOSIS — Z9989 Dependence on other enabling machines and devices: Secondary | ICD-10-CM | POA: Diagnosis not present

## 2021-03-28 DIAGNOSIS — Z87891 Personal history of nicotine dependence: Secondary | ICD-10-CM | POA: Diagnosis not present

## 2021-03-28 DIAGNOSIS — N183 Chronic kidney disease, stage 3 unspecified: Secondary | ICD-10-CM | POA: Diagnosis not present

## 2021-03-28 DIAGNOSIS — I129 Hypertensive chronic kidney disease with stage 1 through stage 4 chronic kidney disease, or unspecified chronic kidney disease: Secondary | ICD-10-CM | POA: Diagnosis not present

## 2021-03-28 DIAGNOSIS — I63411 Cerebral infarction due to embolism of right middle cerebral artery: Secondary | ICD-10-CM | POA: Diagnosis not present

## 2021-03-28 DIAGNOSIS — Z950 Presence of cardiac pacemaker: Secondary | ICD-10-CM | POA: Diagnosis not present

## 2021-03-28 DIAGNOSIS — I2510 Atherosclerotic heart disease of native coronary artery without angina pectoris: Secondary | ICD-10-CM | POA: Diagnosis present

## 2021-03-28 DIAGNOSIS — E785 Hyperlipidemia, unspecified: Secondary | ICD-10-CM | POA: Diagnosis not present

## 2021-03-28 DIAGNOSIS — E119 Type 2 diabetes mellitus without complications: Secondary | ICD-10-CM

## 2021-03-28 DIAGNOSIS — E1142 Type 2 diabetes mellitus with diabetic polyneuropathy: Secondary | ICD-10-CM | POA: Diagnosis not present

## 2021-03-28 DIAGNOSIS — Z006 Encounter for examination for normal comparison and control in clinical research program: Secondary | ICD-10-CM | POA: Diagnosis not present

## 2021-03-28 DIAGNOSIS — Z8249 Family history of ischemic heart disease and other diseases of the circulatory system: Secondary | ICD-10-CM | POA: Diagnosis not present

## 2021-03-28 DIAGNOSIS — I63231 Cerebral infarction due to unspecified occlusion or stenosis of right carotid arteries: Secondary | ICD-10-CM | POA: Diagnosis not present

## 2021-03-28 DIAGNOSIS — I6203 Nontraumatic chronic subdural hemorrhage: Secondary | ICD-10-CM | POA: Diagnosis not present

## 2021-03-28 DIAGNOSIS — M1990 Unspecified osteoarthritis, unspecified site: Secondary | ICD-10-CM | POA: Diagnosis present

## 2021-03-28 DIAGNOSIS — R410 Disorientation, unspecified: Secondary | ICD-10-CM | POA: Diagnosis present

## 2021-03-28 DIAGNOSIS — Z794 Long term (current) use of insulin: Secondary | ICD-10-CM | POA: Diagnosis not present

## 2021-03-28 DIAGNOSIS — Z743 Need for continuous supervision: Secondary | ICD-10-CM | POA: Diagnosis not present

## 2021-03-28 DIAGNOSIS — G9389 Other specified disorders of brain: Secondary | ICD-10-CM | POA: Diagnosis not present

## 2021-03-28 DIAGNOSIS — F0280 Dementia in other diseases classified elsewhere without behavioral disturbance: Secondary | ICD-10-CM | POA: Diagnosis present

## 2021-03-28 DIAGNOSIS — R531 Weakness: Secondary | ICD-10-CM | POA: Diagnosis not present

## 2021-03-28 DIAGNOSIS — Z803 Family history of malignant neoplasm of breast: Secondary | ICD-10-CM | POA: Diagnosis not present

## 2021-03-28 DIAGNOSIS — I63239 Cerebral infarction due to unspecified occlusion or stenosis of unspecified carotid arteries: Secondary | ICD-10-CM | POA: Diagnosis not present

## 2021-03-28 DIAGNOSIS — Z9889 Other specified postprocedural states: Secondary | ICD-10-CM | POA: Diagnosis not present

## 2021-03-28 DIAGNOSIS — R269 Unspecified abnormalities of gait and mobility: Secondary | ICD-10-CM | POA: Diagnosis not present

## 2021-03-28 DIAGNOSIS — I10 Essential (primary) hypertension: Secondary | ICD-10-CM | POA: Diagnosis not present

## 2021-03-28 DIAGNOSIS — K219 Gastro-esophageal reflux disease without esophagitis: Secondary | ICD-10-CM | POA: Diagnosis present

## 2021-03-28 DIAGNOSIS — G4733 Obstructive sleep apnea (adult) (pediatric): Secondary | ICD-10-CM | POA: Diagnosis not present

## 2021-03-28 DIAGNOSIS — D508 Other iron deficiency anemias: Secondary | ICD-10-CM | POA: Diagnosis not present

## 2021-03-28 DIAGNOSIS — Z23 Encounter for immunization: Secondary | ICD-10-CM | POA: Diagnosis not present

## 2021-03-28 DIAGNOSIS — L03114 Cellulitis of left upper limb: Secondary | ICD-10-CM | POA: Diagnosis not present

## 2021-03-28 DIAGNOSIS — I672 Cerebral atherosclerosis: Secondary | ICD-10-CM | POA: Diagnosis not present

## 2021-03-28 DIAGNOSIS — Z87820 Personal history of traumatic brain injury: Secondary | ICD-10-CM | POA: Diagnosis not present

## 2021-03-28 DIAGNOSIS — L089 Local infection of the skin and subcutaneous tissue, unspecified: Secondary | ICD-10-CM | POA: Diagnosis not present

## 2021-03-28 DIAGNOSIS — Z20822 Contact with and (suspected) exposure to covid-19: Secondary | ICD-10-CM | POA: Diagnosis not present

## 2021-03-28 DIAGNOSIS — E538 Deficiency of other specified B group vitamins: Secondary | ICD-10-CM | POA: Diagnosis not present

## 2021-03-28 DIAGNOSIS — I6601 Occlusion and stenosis of right middle cerebral artery: Secondary | ICD-10-CM | POA: Diagnosis not present

## 2021-03-28 DIAGNOSIS — I6521 Occlusion and stenosis of right carotid artery: Secondary | ICD-10-CM | POA: Diagnosis not present

## 2021-03-28 DIAGNOSIS — Z7902 Long term (current) use of antithrombotics/antiplatelets: Secondary | ICD-10-CM | POA: Diagnosis not present

## 2021-03-28 DIAGNOSIS — E1122 Type 2 diabetes mellitus with diabetic chronic kidney disease: Secondary | ICD-10-CM | POA: Diagnosis not present

## 2021-03-28 DIAGNOSIS — Z7984 Long term (current) use of oral hypoglycemic drugs: Secondary | ICD-10-CM | POA: Diagnosis not present

## 2021-03-28 DIAGNOSIS — I63511 Cerebral infarction due to unspecified occlusion or stenosis of right middle cerebral artery: Secondary | ICD-10-CM | POA: Diagnosis not present

## 2021-03-28 DIAGNOSIS — Z8673 Personal history of transient ischemic attack (TIA), and cerebral infarction without residual deficits: Secondary | ICD-10-CM | POA: Diagnosis not present

## 2021-03-28 DIAGNOSIS — Z66 Do not resuscitate: Secondary | ICD-10-CM | POA: Diagnosis not present

## 2021-03-28 DIAGNOSIS — R0902 Hypoxemia: Secondary | ICD-10-CM | POA: Diagnosis not present

## 2021-03-28 LAB — GLUCOSE, CAPILLARY
Glucose-Capillary: 111 mg/dL — ABNORMAL HIGH (ref 70–99)
Glucose-Capillary: 132 mg/dL — ABNORMAL HIGH (ref 70–99)
Glucose-Capillary: 127 mg/dL — ABNORMAL HIGH (ref 70–99)
Glucose-Capillary: 148 mg/dL — ABNORMAL HIGH (ref 70–99)

## 2021-03-28 MED ORDER — WHITE PETROLATUM EX OINT
TOPICAL_OINTMENT | CUTANEOUS | Status: AC
  Filled 2021-03-28: qty 28.35

## 2021-03-28 NOTE — Progress Notes (Deleted)
RN walked pt out for [employer] to pick him up and take him to [facility] on [address], [place]. Voucher provided by ED SW.

## 2021-03-28 NOTE — Consult Note (Signed)
Chief Complaint: Patient was seen in consultation today for R ICA stenosis  Supervising Physician: Luanne Bras  Patient Status: [facility] - In-pt  History of Present Illness: Frank [name] is a 80 y.o. 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 He was a good candidate for inpatient rehab and was able to participate, however this was complicated by bronchitis and his recovery was limited by ongoing weakness.  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 He was scheduled for diagnostic angiogram with intent to treat Monday September 19th, however is currently admitted with left thumb infection.  He did initiate Augmentin as an output, and received one dose of vancomycin in-house, however currently seems to be doing well without further need for antibiotics.  IR contacted by family and admitting service regarding plans for Monday's scheduled procedure.   Patient assessed at beside alongside Dr. Estanislado Pandy.  Frank [name] answers all questions and reports he is at his baseline.  He does have some ongoing deficits related to his recent stroke including memory loss (also with a history of dementia), gait imbalance, and frequent falls. Current recommendation from PT/OT is for admission to SNF at d/c.  Frank [name] is agreeable to moving forward with procedure prior to discharge.     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, Ischemic optic   OSA (obstructive sleep apnea)    cpap   TBI (traumatic brain injury) [facility])     Past Surgical History:  Procedure Laterality Date   APPENDECTOMY     arm fracture Left 2011   with hardware   BURR HOLE Bilateral 08/04/2017   Procedure: Haskell Flirt;  Surgeon: [name], MD;  Location: [place];  Service: Neurosurgery;  Laterality: Bilateral;   COLONOSCOPY     CRANIOTOMY Left 12/01/2017   Procedure: Trudee Kuster Holes CRANIOTOMY HEMATOMA EVACUATION SUBDURAL;  Surgeon: [name], MD;  Location: [place];  Service: Neurosurgery;  Laterality: Left;   ESOPHAGOGASTRODUODENOSCOPY  2006   gastritis   IR PERCUTANEOUS ART THROMBECTOMY/INFUSION INTRACRANIAL INC DIAG ANGIO  09/09/2020   IR RADIOLOGIST EVAL & MGMT  03/10/2021   PACEMAKER IMPLANT N/A 05/14/2020   Procedure: PACEMAKER IMPLANT;  Surgeon: [name], MD;  Location: [place] CV LAB;  Service: Cardiovascular;  Laterality: N/A;   RADIOLOGY WITH ANESTHESIA N/A 09/09/2020   Procedure: IR WITH ANESTHESIA;  Surgeon: Radiologist, Medication, MD;  Location: [place];  Service: Radiology;  Laterality: N/A;   ROTATOR CUFF REPAIR     SEPTOPLASTY  1959   Deviated Septum   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 Authorizing Provider  acetaminophen (TYLENOL) 500 MG tablet Take 500-1,000 mg by mouth every 6 (six)  hours as needed for moderate pain or headache.   Yes [provider]  amoxicillin-clavulanate (AUGMENTIN) 875-125 MG tablet Take 1 tablet by mouth 2 (two) times daily. 03/25/21  Yes Biagio Borg, MD  aspirin 81 MG chewable tablet Chew 1 tablet (81 mg total) by mouth daily. 09/18/20  Yes Pashayan, Redgie Grayer, MD  atorvastatin (LIPITOR) 10 MG tablet Take 1 tablet (10 mg total) by mouth daily. 09/18/20  Yes Pashayan,  Redgie Grayer, MD  cloNIDine (CATAPRES - DOSED IN MG/24 HR) 0.1 mg/24hr patch Place 1 patch (0.1 mg total) onto the skin once a week. 11/13/20  Yes Janith Lima, MD  Ferric Maltol (ACCRUFER) 30 MG CAPS Take 1 capsule by mouth in the morning and at bedtime. Patient taking differently: Take 30 mg by mouth in the morning and at bedtime. 11/06/20  Yes Janith Lima, MD  FLUoxetine (PROZAC) 20 MG capsule TAKE 1 CAPSULE BY MOUTH  DAILY Patient taking differently: Take 20 mg by mouth daily. 03/13/21  Yes Janith Lima, MD  fluticasone (FLONASE) 50 MCG/ACT nasal spray Place 1-2 sprays into both nostrils daily as needed for allergies or rhinitis.   Yes [provider]  insulin glargine, 2 Unit Dial, (TOUJEO MAX SOLOSTAR) 300 UNIT/ML Solostar Pen Inject 10 Units into the skin daily. 01/09/21  Yes Janith Lima, MD  levETIRAcetam (KEPPRA) 500 MG tablet Take 1 tablet (500 mg total) by mouth 2 (two) times daily. 01/19/21  Yes Janith Lima, MD  pantoprazole (PROTONIX) 40 MG tablet TAKE 1 TABLET BY MOUTH  DAILY Patient taking differently: Take 40 mg by mouth daily. 02/19/21  Yes Janith Lima, MD  solifenacin (VESICARE) 10 MG tablet TAKE 1 TABLET BY MOUTH  DAILY Patient taking differently: Take 10 mg by mouth daily. 03/16/21  Yes Janith Lima, MD  ticagrelor (BRILINTA) 90 MG TABS tablet Take 0.5 tablets (45 mg total) by mouth 2 (two) times daily. 11/14/20  Yes Janith Lima, MD  amLODipine (NORVASC) 10 MG tablet Take 1 tablet (10 mg total) by mouth daily. Patient taking differently: Take 10 mg by mouth at bedtime. 09/18/20   Briant Cedar, MD  glipiZIDE (GLUCOTROL XL) 10 MG 24 hr tablet TAKE 1 TABLET BY MOUTH  DAILY 03/28/21   Janith Lima, MD  Insulin Pen Needle 32G X 6 MM MISC 1 Act by Does not apply route daily. 04/08/20   Janith Lima, MD  PRESCRIPTION MEDICATION CPAP- At bedtime    [provider]     Family History  Problem Relation Age of Onset   Heart disease Mother     Breast cancer Mother    Stroke Father    Allergies Father        Father and children   Coronary artery disease Brother    Diabetes Neg Hx    Colon cancer Neg Hx     Social History   Socioeconomic History   Marital status: Married    Spouse name: Not on file   Number of children: 1   Years of education: Not on file   Highest education level: Not on file  Occupational History   Not on file  Tobacco Use   Smoking status: Former    Packs/day: 1.00    Types: Cigarettes    Quit date: 07/14/1983    Years since quitting: 37.7   Smokeless tobacco: Never  Vaping Use   Vaping Use: Never used  Substance and Sexual Activity  Alcohol use: Yes    Alcohol/week: 2.0 standard drinks    Types: 2 Standard drinks or equivalent per week    Comment: occassionally   Drug use: No   Sexual activity: Never  Other Topics Concern   Not on file  Social History Narrative   Right handed    Lives with wife   Has a caregiver    Social Determinants of Health   Financial Resource Strain: Not on file  Food Insecurity: Not on file  Transportation Needs: Not on file  Physical Activity: Not on file  Stress: Not on file  Social Connections: Not on file     Review of Systems: A 12 point ROS discussed and pertinent positives are indicated in the HPI above.  All other systems are negative.  Review of Systems  Constitutional:  Negative for fatigue and fever.  Respiratory:  Negative for cough and shortness of breath.   Cardiovascular:  Negative for chest pain.  Gastrointestinal:  Negative for abdominal pain.  Genitourinary:  Negative for dysuria.  Musculoskeletal:  Negative for back pain.  Psychiatric/Behavioral:  Positive for confusion (intermittent, limited insight). Negative for behavioral problems.    Vital Signs: BP 127/68 (BP Location: Right Arm)   Pulse 72   Temp 97.6 F (36.4 C) (Oral)   Resp 18   Ht '5\' 9"'$  (1.753 m)   Wt 184 lb (83.5 kg)   SpO2 100%   BMI 27.17 kg/m   Physical  Exam Vitals and nursing note reviewed.  Constitutional:      General: He is not in acute distress.    Appearance: Normal appearance. He is not ill-appearing.  HENT:     Mouth/Throat:     Mouth: Mucous membranes are moist.     Pharynx: Oropharynx is clear.  Cardiovascular:     Rate and Rhythm: Normal rate and regular rhythm.  Pulmonary:     Effort: Pulmonary effort is normal. No respiratory distress.     Breath sounds: Normal breath sounds.  Abdominal:     General: Abdomen is flat.     Palpations: Abdomen is soft.  Musculoskeletal:        General: Signs of injury (left thumb wound, red, bruised) present. Normal range of motion.  Skin:    General: Skin is warm and dry.  Neurological:     Mental Status: He is alert and oriented to person, place, and time. Mental status is at baseline.     Motor: Weakness present.     Gait: Gait abnormal (uses walker or wheel chair).     Comments: Speech intelligible, tongue midline, no slurred speech, cognition intact but with limited insight, moving all extremities spontaneously.  Psychiatric:        Mood and Affect: Mood normal.        Behavior: Behavior normal.        Thought Content: Thought content normal.        Judgment: Judgment normal.     MD Evaluation Airway: WNL Heart: WNL Abdomen: WNL Chest/ Lungs: WNL ASA  Classification: 3 Mallampati/Airway Score: Two   Imaging: CT HEAD WO CONTRAST (5MM)  Result Date: 03/26/2021 CLINICAL DATA:  Confusion.  History of traumatic brain injury. EXAM: CT HEAD WITHOUT CONTRAST TECHNIQUE: Contiguous axial images were obtained from the base of the skull through the vertex without intravenous contrast. COMPARISON:  None. 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 Right-sided chronic subdural collection is also unchanged. There is unchanged encephalomalacia of the right parietal and  temporal lobes. There is periventricular hypoattenuation compatible with chronic  microvascular disease. Vascular: Unchanged right MCA branch stent. Skull: Remote right frontal burr hole and left frontoparietal craniectomy. Sinuses/Orbits: No fluid levels or advanced mucosal thickening of the visualized paranasal sinuses. No mastoid or middle ear effusion. The orbits are normal. IMPRESSION: 1. Unchanged bilateral chronic subdural hematomas. No acute abnormality. 2. Unchanged encephalomalacia of the right parietal and temporal lobes. 3. Unchanged right MCA branch stent. Electronically Signed   By: Ulyses Jarred M.D.   On: 03/26/2021 19:02   DG Finger Thumb Left  Result Date: 03/26/2021 CLINICAL DATA:  From infection. EXAM: LEFT THUMB 2+V COMPARISON:  None. FINDINGS: No evidence of fracture. No subluxation or dislocation. No radiopaque soft tissue foreign body. No evidence for bony destruction to suggest osteomyelitis. IMPRESSION: No evidence for bony destruction or radiopaque soft tissue foreign body. Electronically Signed   By: Misty Stanley M.D.   On: 03/26/2021 09:44   IR Radiologist Eval & Mgmt  Result Date: 03/10/2021 EXAM: ESTABLISHED PATIENT OFFICE VISIT CHIEF COMPLAINT: Follow-up visit. Current Pain Level: 1-10 HISTORY OF PRESENT ILLNESS: Patient is an 80 year old right handed gentleman who returns in follow-up with spouse. According to the patient and spouse there has been significant improvement in the patient's overall condition. She reports he is now able to walk independently with a walker and able to cope with most of the daily living activities. He has no problems feeding himself or taking a shower. He does need some help with shaving. She reports the patient is active in reading email and using his computer. He does not drive. He reports that he is able to sleep without any difficulty. Denies any symptoms of nausea, vomiting, visual blurring, though does need reading glasses to work on the computer and study. He reports no significant change in the decreased visual field in  the left lower quadrant since the stroke. He reports no episodes of loss of consciousness, or of seizure-like activity. He denies any recent chills, fever or rigors. He denies any difficulty with urination, dysuria or hematuria. He denies any constipation, diarrhea, melena, or difficulty swallowing liquids or solids. No recent chest pain, shortness of breath, wheezing, coughing up of sputum or of hemoptysis. Easy bruisability probably related to patient's use of aspirin and Brilinta. Diagnosis * : Date . * : Allergy * : * : Rhinitis . * : Anemia * : * : NOS iron deficient and B12 deficient . * : Arthritis * : . * : AV block, Mobitz 2 * : 05/14/2020 . * : Diabetes mellitus * : * : Type 2 . * : GERD (gastroesophageal reflux disease) * : . * : Hyperlipidemia * : . * : Hypertension * : . 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 * : OSA (obstructive sleep apnea) * : * : cpap . * : TBI (traumatic brain injury) ([place]) * : PAST SURGICAL HISTORY: Past Surgical History: Procedure * : Laterality * : Date . * : APPENDECTOMY * : * : . * : arm fracture * : Left * : 2011 * : with hardware . * Trudee Kuster HOLE * : Bilateral * : 08/04/2017 * : Procedure: Haskell Flirt; Surgeon: [name], MD; Location: [place]; Service: Neurosurgery; Laterality: Bilateral; . * : COLONOSCOPY * : * : . * : CRANIOTOMY * : Left * : 12/01/2017 * : Procedure: Trudee Kuster Holes CRANIOTOMY HEMATOMA EVACUATION SUBDURAL; Surgeon: [name], MD; Location: [place]; Service:  Neurosurgery; Laterality: Left; . * : ESOPHAGOGASTRODUODENOSCOPY * : * : 2006 * : gastritis . * : IR PERCUTANEOUS ART THROMBECTOMY/INFUSION INTRACRANIAL INC DIAG ANGIO * : * : 09/09/2020 . * : PACEMAKER IMPLANT * : N/A * : 05/14/2020 * : Procedure: PACEMAKER IMPLANT; Surgeon: [name], MD; Location: [place] [facility] LAB; Service: Cardiovascular; Laterality: N/A; . * : RADIOLOGY WITH ANESTHESIA * : N/A * : 09/09/2020 * : Procedure: IR WITH ANESTHESIA; Surgeon: Radiologist, Medication, MD; Location: [place]; Service: Radiology; Laterality: N/A; . * : ROTATOR CUFF REPAIR * : * : . * : SEPTOPLASTY * : * : 1959 * : Deviated Septum . 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 * : amLODipine (NORVASC) 10 MG tablet * : Take 1 tablet (10 mg total) by mouth daily. * : * : . * : aspirin 81 MG chewable tablet * : Chew 1 tablet (81 mg total) by mouth daily. * : * : . * : atorvastatin (LIPITOR) 10 MG tablet * : Take 1 tablet (10 mg total) by mouth daily. * : * : . * : benzonatate (TESSALON) 100 MG capsule * : Take 1 capsule (100 mg total) by mouth 3 (three) times daily. * : 20 capsule * : 0 . * : cloNIDine (CATAPRES - DOSED IN MG/24 HR) 0.1 mg/24hr patch * : Place 1 patch (0.1 mg total) onto the skin once a week. (Patient not taking: Reported on 01/03/2021) * : 12 patch * : 1 . * : Dextromethorphan-guaiFENesin (ROBITUSSIN DM PO) * : Take 15 mLs by mouth See admin instructions. Qid x 7 days * : * : . * : Ferric Maltol (ACCRUFER) 30 MG CAPS * : Take 1 capsule by mouth in the morning and at bedtime. (Patient taking differently: Take 30 mg by mouth in the morning and at bedtime.) * : 180 capsule * : 1 . * : FLUoxetine (PROZAC) 20 MG capsule * : TAKE 1 CAPSULE BY MOUTH DAILY (Patient taking differently: Take 20 mg by mouth daily.) * : 90 capsule * : 1 . * : fluticasone (FLONASE) 50 MCG/ACT nasal spray * : Place 1 spray into both nostrils daily. * : * : . * : glipiZIDE (GLUCOTROL XL) 10 MG 24 hr tablet * : TAKE 1 TABLET BY MOUTH  DAILY * : 90 tablet * : 0 . * : Glucagon (GVOKE HYPOPEN 2-PACK) 1 MG/0.2ML SOAJ * : Inject 1 Act into the skin daily as needed. (Patient taking differently: Inject 1 each into the skin as needed (low blood sugar).) * : 2 mL * : 5 . * : guaiFENesin (ROBITUSSIN) 100 MG/5ML SOLN * : Take 5 mLs (100 mg total) by mouth every 4 (four) hours as needed for cough or to loosen phlegm. * : 236 mL * : 0 .  * : hydrocortisone cream 1 % * : Apply 1 application topically 4 (four) times daily as needed for itching. * : * : . * : Insulin Pen Needle 32G X 6 MM MISC * : 1 Act by Does not apply route daily. * : 100 each * : 1 . * : levETIRAcetam (KEPPRA) 500 MG tablet * : Take 500 mg by mouth 2 (two) times daily. * : * : . * : loratadine (CLARITIN) 10 MG tablet * : Take 1  tablet (10 mg total) by mouth every evening. * : * : . * : OVER THE COUNTER MEDICATION * : Take 120 mLs by mouth in the morning and at bedtime. Med pass 2.0 * : * : . * : OXYGEN * : Inhale 2-4 L into the lungs See admin instructions. To keep O2 greater than 90% * : * : . * : pantoprazole (PROTONIX) 40 MG tablet * : TAKE 1 TABLET BY MOUTH DAILY (Patient taking differently: Take 40 mg by mouth daily.) * : 90 tablet * : 1 . * : PRESCRIPTION MEDICATION * : CPAP- At bedtime * : * : . * : senna-docusate (SENOKOT-S) 8.6-50 MG tablet * : Take 1 tablet by mouth at bedtime as needed for mild constipation. * : * : . * : solifenacin (VESICARE) 10 MG tablet * : Take 1 tablet (10 mg total) by mouth daily. * : 90 tablet * : 1 . * : ticagrelor (BRILINTA) 90 MG TABS tablet * : Take 0.5 tablets (45 mg total) by mouth 2 (two) times daily. * : 180 tablet * : 0 . * : TOUJEO MAX SOLOSTAR 300 UNIT/ML Solostar Pen * : INJECT SUBCUTANEOUSLY 10  UNITS DAILY * : 6 mL * : 1 ALLERGIES: Allergies Allergen * : Reactions . * : Lisinopril * : Cough . * : Invokana Canagliflozin * : Other (See Comments) * : * : Stopped by MD . * : Penicillins * : Other (See Comments) * : * : Allergic as a child. Patient does not remember reaction. Has had cephalasporins without problems. . * : Sulfamethoxazole * : Other (See Comments) * : * : Unknown reaction- from childhood FAMILY HISTORY: Family History Problem * : Relation * : Age of Onset . * : Heart disease * : Mother * : . * : Breast cancer * : Mother * : . * : Stroke * : Father * : . * : Allergies * : Father * : * :     Father and children . * :  Coronary artery disease * : Brother * : . * : Diabetes * : Neg Hx * : . * : Colon cancer * : Neg Hx * : REVIEW OF SYSTEMS: Negative unless as mentioned above. PHYSICAL EXAMINATION: Patient is awake, alert and oriented to time, place, space. Normal eye contact. Appears in no acute distress. Neurologically grossly intact except for decrease in the left lower quadrant visual field on confrontation. Coordination: No difficulty with finger-to-nose testing bilaterally. Station gait not tested. 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 The spouse and the patient are agreeable to proceed with the diagnostic catheter arteriogram in the presence of anesthesia with intent to treat the right internal carotid artery proximal stenosis. Patient was advised to continue with his present medications. A platelet inhibition test will be performed today in order to evaluate and/or to make any adjustments to the dose given the patient's symptoms of easy bruisability. 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 The procedure will be scheduled sometime in mid to the third week in September 2022. In the meantime, the patient advised to continue with twice a week outpatient physical therapy as scheduled. Electronically  Signed   By: Luanne Bras M.D.   On: 03/07/2021 13:55    Labs:  CBC: Recent Labs    12/26/20 1600 02/10/21 0912 03/26/21 0915 03/27/21 0327  WBC 9.8 9.2 11.0* 8.6  HGB 10.5* 11.0* 11.0* 11.6*  HCT 32.4* 33.9* 35.0* 36.5*  PLT 174.0 205.0 180 162    COAGS: Recent Labs    09/09/20 1101 11/18/20 0945  INR 1.0 1.2  APTT 32 29    BMP: Recent Labs    11/21/20 0256 11/24/20 0815  12/26/20 1600 02/10/21 0912 03/26/21 0915 03/27/21 0327  NA 137 135 137 138 135 137  K 3.7 4.1 3.9 4.2 4.5 3.9  CL 101 99 99 101 102 103  CO2 '27 30 31 29 25 27  '$ GLUCOSE 128* 164* 185* 105* 181* 95  BUN 24* 25* 32* 26* 29* 27*  CALCIUM 8.9 8.8* 9.1 9.1 8.7* 8.9  CREATININE 1.54* 1.57* 1.61* 1.33 1.46* 1.37*  GFRNONAA 45* 44*  --   --  48* 52*    LIVER FUNCTION TESTS: Recent Labs    09/19/20 0938 11/18/20 0902 12/26/20 1600 03/26/21 0915  BILITOT 0.6 0.8 0.6 0.5  AST 52* '18 16 24  '$ ALT 78* '15 15 29  '$ ALKPHOS 75 111 92 77  PROT 5.5* 6.7 6.6 6.2*  ALBUMIN 2.7* 3.3* 3.6 3.0*    TUMOR MARKERS: No results for input(s): AFPTM, CEA, CA199, CHROMGRNA in the last 8760 hours.  Assessment and Plan: Right ICA proximal stenosis Patient scheduled for diagnostic angiogram with intent to treat R ICA stenosis on Monday 9/19 with anesthesia.  He is currently admitted with a left thumb infection which was treated with short course of antibiotics and is currently stable.  Of note, he has had increased falls at home which may have contributed to his left thumb infection.  PT/OT currently recommended SNF at d/c for increased strength, mobility, and balance.  Dr. Estanislado Pandy has discussed the merits of pursuing outpatient, elective procedure vs. Inpatient procedure with Dr. Verlon Au. Given stability of current infection without signs of osteomyelitis or spread, potential need for SNF at d/c, and ongoing weakness, plan made to pursue inpatient procedure as scheduled on Monday.  NPO p MN Monday.  Continue aspirin and Brilinta as ordered.  Will hold lovenox Monday.  Will discuss procedure and plans with patient's wife for consent.    Thank you for this interesting consult.  I greatly enjoyed meeting Frank [name] and look forward to participating in their care.  A copy of this report was sent to the requesting provider on this date.  Electronically Signed: Docia Barrier,  PA 03/28/2021, 12:39 PM   I spent a total of 40 Minutes    in face to face in clinical consultation, greater than 50% of which was counseling/coordinating care for right ICA stenosis.

## 2021-03-28 NOTE — Progress Notes (Signed)
Physical Therapy Treatment Patient Details Name: Frank [name] MRN: [id number] DOB: [birth year] Today's Date: 03/28/2021   History of Present Illness Pt admitted on 9/14 with L thumb infection and h/o recent falls. PMH significant of DM, HTN, HLD, OSA on CPAP, TBI, dementia, and CVA.    PT Comments    Pt was seen for moving from bed to [facility], then to recliner.  He is motivated to work with PT and demonstrates better standing balance control today.  Pt is with wife who wants to see if CIR will recommend him a bed.  Will send note to CIR to screen him, and see if he is a candidate for that unit.  Follow along for acute PT and work to increase gait distances and standing balance.   Recommendations for follow up therapy are one component of a multi-disciplinary discharge planning process, led by the attending physician.  Recommendations may be updated based on patient status, additional functional criteria and insurance authorization.  Follow Up Recommendations  SNF     Equipment Recommendations    None   Recommendations for Other Services       Precautions / Restrictions Precautions Precautions: Fall Precaution Comments: h/o recent falls at home Restrictions Weight Bearing Restrictions: No     Mobility  Bed Mobility Overal bed mobility: Needs Assistance Bed Mobility: Supine to Sit Rolling: Min assist   Supine to sit: Min assist;Mod assist     General bed mobility comments: mod to lift trunk but legs min assist    Transfers Overall transfer level: Needs assistance Equipment used: Rolling walker (2 wheeled) Transfers: Sit to/from Stand Sit to Stand: Mod assist         General transfer comment: mod from bed with transition to [facility] and then to recliner  Ambulation/Gait Ambulation/Gait assistance: Min assist Gait Distance (Feet): 8 Feet Assistive device: Rolling walker (2 wheeled);1 person hand held assist Gait Pattern/deviations: Step-through pattern;Decreased  stride length;Wide base of support;Trunk flexed Gait velocity: reduced   General Gait Details: gait is more functional with less posterior instability   Stairs             Wheelchair Mobility    Modified Rankin (Stroke Patients Only)       Balance Overall balance assessment: Needs assistance Sitting-balance support: Feet supported;Single extremity supported Sitting balance-Leahy Scale: Fair     Standing balance support: Bilateral upper extremity supported;During functional activity Standing balance-Leahy Scale: Poor                              Cognition Arousal/Alertness: Awake/alert Behavior During Therapy: WFL for tasks assessed/performed;Flat affect Overall Cognitive Status: History of cognitive impairments - at baseline                                 General Comments: directions for sequencing transfers      Exercises      General Comments General comments (skin integrity, edema, etc.): pt is up to stand and take steps in his room to [facility] and then pivot turn to recliner      Pertinent Vitals/Pain Pain Assessment: Faces Faces Pain Scale: Hurts a little bit Pain Location: generally Pain Descriptors / Indicators: Discomfort Pain Intervention(s): Monitored during session;Repositioned    Home Living                      Prior  Function            PT Goals (current goals can now be found in the care plan section) Acute Rehab PT Goals Patient Stated Goal: not stated Progress towards PT goals: Progressing toward goals    Frequency    Min 2X/week      PT Plan Current plan remains appropriate    Co-evaluation              AM-PAC PT "6 Clicks" Mobility   Outcome Measure  Help needed turning from your back to your side while in a flat bed without using bedrails?: A Lot Help needed moving from lying on your back to sitting on the side of a flat bed without using bedrails?: A Lot Help needed moving to and  from a bed to a chair (including a wheelchair)?: A Lot Help needed standing up from a chair using your arms (e.g., wheelchair or bedside chair)?: A Lot Help needed to walk in hospital room?: A Lot Help needed climbing 3-5 steps with a railing? : Total 6 Click Score: 11    End of Session Equipment Utilized During Treatment: Gait belt Activity Tolerance: Patient limited by fatigue;Treatment limited secondary to medical complications (Comment) Patient left: in chair;with call bell/phone within reach;with chair alarm set Nurse Communication: Mobility status PT Visit Diagnosis: Unsteadiness on feet (R26.81);Muscle weakness (generalized) (M62.81);History of falling (Z91.81)     Time: [id number] PT Time Calculation (min) (ACUTE ONLY): 36 min  Charges:  $Therapeutic Activity: 23-37 mins                  Ramond Dial 03/28/2021, 8:39 PM  Mee Hives, PT MS Acute Rehab Dept. Number: [place]

## 2021-03-28 NOTE — Progress Notes (Signed)
Pt placed on CPAP. Rt will cont to monitor.

## 2021-03-28 NOTE — Progress Notes (Signed)
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 Salley Scarlet of dermatology--used Band-Aid to cover had  several accidental trips and fall lifting the left thumbnail-saw PCP was placed on p.o. antibiotic + injection but because of worsening erythema pain etc. brought to ED Dr. Apolonio Schneiders of hand surgery reviewed the images and does not feel antibiotics were needed  Hospital-Problem based course  Trauma to left Thumb Does not require any antibiotics, symptomatic management and potential outpatient follow-up with Hand, Dr. Apolonio Schneiders who gave recommendations to be admitting physician continue Norco 1-2 every 4 as needed severe pain Frequent falls Unsafe to discharge because of 2 falls recently and mild cognitive impairments Therapy services rec's SNF--Family wish to take the patient home Strokes in the past Prior large MCA infarct with hemorrhagic conversion 2/28 Continue ticagrelor 45 mg twice daily, aspirin 81 mg, Keppra 500 twice daily [is on blood thinner because RCA stenosis ?]   Continue atorvastatin 10 daily R ICA stenosis Will ned diagnostic angiogram 03/31/21 under Neuro-IR Dr. Estanislado Pandy Subsequent planning to follow post-procedure Hypertension Continue  amlodipine 10 nightly, clonidine patch 0.1 q. 7 days Diabetes mellitus type 2 Glipizide XL 10 mg held on admission, continue 10 units of long-acting insulin  CBG well controlled 111-132    DVT prophylaxis: Lovenox Code Status: DNR Family Communication: None present at the bedside Disposition:  Status is: Inpatient  Remains inpatient appropriate because:Hemodynamically unstable, Unsafe d/c plan, and IV treatments appropriate due to intensity of illness or inability to take PO  Dispo: The patient is from: Home              Anticipated d/c is to:  To be determined unclear at this time may need skilled but if is cleared can go home as early as tomorrow with home health              Patient currently is not medically stable to d/c.   Difficult to place patient No       Consultants:  Hand surgeon Dr. Apolonio Schneiders  Procedures:   Antimicrobials:     Subjective:  Awake coherent not confused no distress Some oozing from the L thumb No fever no chills no n/v   Objective: Vitals:   03/27/21 2035 03/27/21 2248 03/28/21 0427 03/28/21 0830  BP: 131/68  (!) 152/78 127/68  Pulse: 73 74 68 72  Resp: '17 18 17 18  '$ Temp: 98.1 F (36.7 C)  97.7 F (36.5 C) 97.6 F (36.4 C)  TempSrc:    Oral  SpO2: 98% 98% 95% 100%  Weight:      Height:        Intake/Output Summary (Last 24 hours) at 03/28/2021 1301 Last data filed at 03/28/2021 1115 Gross per 24 hour  Intake 240 ml  Output --  Net 240 ml    Filed Weights   03/26/21 1600  Weight: 83.5 kg  Examination:  EOMI NCAT  coherent and pleasant S1-S2 no murmur no rub no gallop CTA B no added sound no rales no rhonchi Abdomen soft L thumb looks good but has some serous reddish discharge--this is not new   Data Reviewed: personally reviewed   CBC    Component Value Date/Time   WBC 8.6 03/27/2021 0327   RBC 4.27 03/27/2021 0327   HGB 11.6 (L) 03/27/2021 0327   HGB 10.2 (L) 12/12/2018 1431   HCT 36.5 (L) 03/27/2021 0327    PLT 162 03/27/2021 0327   PLT 160 12/12/2018 1431   MCV 85.5 03/27/2021 0327   MCH 27.2 03/27/2021 0327   MCHC 31.8 03/27/2021 0327   RDW 14.4 03/27/2021 0327   LYMPHSABS 1.1 03/26/2021 0915   MONOABS 0.9 03/26/2021 0915   EOSABS 0.3 03/26/2021 0915   BASOSABS 0.1 03/26/2021 0915   CMP Latest Ref Rng & Units 03/27/2021 03/26/2021 02/10/2021  Glucose 70 - 99 mg/dL 95 181(H) 105(H)  BUN 8 - 23 mg/dL 27(H) 29(H) 26(H)  Creatinine 0.61 - 1.24 mg/dL 1.37(H) 1.46(H) 1.33  Sodium 135 - 145 mmol/L 137 135 138  Potassium 3.5 - 5.1 mmol/L 3.9 4.5 4.2  Chloride 98 - 111 mmol/L 103 102 101  CO2 22 - 32 mmol/L '27 25 29  '$ Calcium 8.9 - 10.3 mg/dL 8.9 8.7(L) 9.1  Total Protein 6.5 - 8.1 g/dL - 6.2(L) -  Total Bilirubin 0.3 - 1.2 mg/dL - 0.5 -  Alkaline Phos 38 - 126 U/L - 77 -  AST 15 - 41 U/L - 24 -  ALT 0 - 44 U/L - 29 -     Radiology Studies: CT HEAD WO CONTRAST (5MM)  Result Date: 03/26/2021 CLINICAL DATA:  Confusion.  History of traumatic brain injury. EXAM: CT HEAD WITHOUT CONTRAST TECHNIQUE: Contiguous axial images were obtained from the base of the skull through the vertex without intravenous contrast. COMPARISON:  None. 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 Right-sided chronic subdural collection is also unchanged. There is unchanged encephalomalacia of the right parietal and temporal lobes. There is periventricular hypoattenuation compatible with chronic microvascular disease. Vascular: Unchanged right MCA branch stent. Skull: Remote right frontal burr hole and left frontoparietal craniectomy. Sinuses/Orbits: No fluid levels or advanced mucosal thickening of the visualized paranasal sinuses. No mastoid or middle ear effusion. The orbits are normal. IMPRESSION: 1. Unchanged bilateral chronic subdural hematomas. No acute abnormality. 2. Unchanged encephalomalacia of the right parietal and temporal lobes. 3. Unchanged right MCA branch stent. Electronically Signed    By: Ulyses Jarred M.D.   On: 03/26/2021 19:02     Scheduled Meds:  amLODipine  10 mg Oral QHS   aspirin  81 mg Oral Daily   atorvastatin  10 mg Oral Daily   [START ON 04/01/2021] cloNIDine  0.1 mg Transdermal Weekly   darifenacin  15 mg Oral Daily   docusate sodium  100 mg Oral BID   enoxaparin (LOVENOX) injection  40 mg Subcutaneous Q24H   FLUoxetine  20 mg Oral Daily   insulin aspart  0-15 Units Subcutaneous TID WC   insulin glargine-yfgn  10 Units Subcutaneous QHS   levETIRAcetam  500 mg Oral BID   pantoprazole  40 mg Oral Daily   ticagrelor  45 mg Oral BID   Continuous Infusions:   LOS: 1 day   Time spent: 60  Nita Sells, MD [facility] To contact the attending provider between 7A-7P or the covering provider during after hours 7P-7A, please  log into the web site [email or web address] and access using universal [place] password for that web site. If you do not have the password, please call the hospital operator.  03/28/2021, 1:01 PM

## 2021-03-28 NOTE — Progress Notes (Signed)
IR called. Pt will have procedure Monday.

## 2021-03-28 NOTE — Care Management CC44 (Signed)
Condition Code 44 Documentation Completed  Patient Details  Name: Caidence Fenelon MRN: [id number] Date of Birth: 1940/12/18   Condition Code 44 given:  Yes Patient signature on Condition Code 44 notice:  Yes Documentation of 2 MD's agreement:  Yes Code 44 added to claim:  Yes    Sharin Mons, RN 03/28/2021, 1:15 PM

## 2021-03-28 NOTE — Care Management Obs Status (Signed)
[facility] NOTIFICATION   Patient Details  Name: Frank [name] MRN: [id number] Date of Birth: Dec 10, 1940   Medicare Observation Status Notification Given:  Yes (left @ bedside for wife)    Sharin Mons, RN 03/28/2021, 1:15 PM

## 2021-03-28 NOTE — Progress Notes (Deleted)
[name] to be D/C'd  per MD order.  Discussed with the patient and all questions fully answered.  VSS, Skin clean, dry and intact without evidence of skin break down, no evidence of skin tears noted.  IV catheter discontinued intact. Site without signs and symptoms of complications. Dressing and pressure applied.  An After Visit Summary was printed and given to the patient.  D/c education completed with patient/family including follow up instructions, medication list, d/c activities limitations if indicated, with other d/c instructions as indicated by MD - patient able to verbalize understanding, all questions fully answered.   Patient instructed to return to ED, call 911, or call MD for any changes in condition.   Patient walked out with RN and d/c to [facility] via [place] with voucher from hospital.

## 2021-03-29 DIAGNOSIS — L089 Local infection of the skin and subcutaneous tissue, unspecified: Secondary | ICD-10-CM | POA: Diagnosis not present

## 2021-03-29 LAB — GLUCOSE, CAPILLARY
Glucose-Capillary: 141 mg/dL — ABNORMAL HIGH (ref 70–99)
Glucose-Capillary: 143 mg/dL — ABNORMAL HIGH (ref 70–99)
Glucose-Capillary: 146 mg/dL — ABNORMAL HIGH (ref 70–99)
Glucose-Capillary: 150 mg/dL — ABNORMAL HIGH (ref 70–99)

## 2021-03-29 LAB — CBC WITH DIFFERENTIAL/PLATELET
Abs Immature Granulocytes: 0.01 10*3/uL (ref 0.00–0.07)
Basophils Absolute: 0 10*3/uL (ref 0.0–0.1)
Basophils Relative: 1 %
Eosinophils Absolute: 0.4 10*3/uL (ref 0.0–0.5)
Eosinophils Relative: 7 %
HCT: 33.9 % — ABNORMAL LOW (ref 39.0–52.0)
Hemoglobin: 11.2 g/dL — ABNORMAL LOW (ref 13.0–17.0)
Immature Granulocytes: 0 %
Lymphocytes Relative: 23 %
Lymphs Abs: 1.5 10*3/uL (ref 0.7–4.0)
MCH: 27.5 pg (ref 26.0–34.0)
MCHC: 33 g/dL (ref 30.0–36.0)
MCV: 83.3 fL (ref 80.0–100.0)
Monocytes Absolute: 0.7 10*3/uL (ref 0.1–1.0)
Monocytes Relative: 11 %
Neutro Abs: 3.8 10*3/uL (ref 1.7–7.7)
Neutrophils Relative %: 58 %
Platelets: 166 10*3/uL (ref 150–400)
RBC: 4.07 MIL/uL — ABNORMAL LOW (ref 4.22–5.81)
RDW: 14 % (ref 11.5–15.5)
WBC: 6.5 10*3/uL (ref 4.0–10.5)
nRBC: 0 % (ref 0.0–0.2)

## 2021-03-29 LAB — BASIC METABOLIC PANEL
Anion gap: 10 (ref 5–15)
BUN: 26 mg/dL — ABNORMAL HIGH (ref 8–23)
CO2: 27 mmol/L (ref 22–32)
Calcium: 9 mg/dL (ref 8.9–10.3)
Chloride: 97 mmol/L — ABNORMAL LOW (ref 98–111)
Creatinine, Ser: 1.31 mg/dL — ABNORMAL HIGH (ref 0.61–1.24)
GFR, Estimated: 54 mL/min — ABNORMAL LOW (ref >60)
Glucose, Bld: 177 mg/dL — ABNORMAL HIGH (ref 70–99)
Potassium: 3.7 mmol/L (ref 3.5–5.1)
Sodium: 134 mmol/L — ABNORMAL LOW (ref 135–145)

## 2021-03-29 NOTE — Plan of Care (Signed)
  Problem: Education: Goal: Knowledge of General Education information will improve Description: Including pain rating scale, medication(s)/side effects and non-pharmacologic comfort measures Outcome: Progressing   Problem: Health Behavior/Discharge Planning: Goal: Ability to manage health-related needs will improve Outcome: Progressing   Problem: Clinical Measurements: Goal: Ability to maintain clinical measurements within normal limits will improve Outcome: Progressing Goal: Will remain free from infection Outcome: Progressing Goal: Diagnostic test results will improve Outcome: Progressing Goal: Respiratory complications will improve Outcome: Progressing Goal: Cardiovascular complication will be avoided Outcome: Progressing   Problem: Coping: Goal: Level of anxiety will decrease Outcome: Progressing   Problem: Nutrition: Goal: Adequate nutrition will be maintained Outcome: Progressing   Problem: Pain Managment: Goal: General experience of comfort will improve Outcome: Completed/Met   Problem: Safety: Goal: Ability to remain free from injury will improve Outcome: Progressing

## 2021-03-29 NOTE — Progress Notes (Signed)
Pt refuses CPAP for tonight and states he "wears nasal pillows and does not like ours and also, the machine made a lot of noises". RT instructed pt to call if he changes his mind.

## 2021-03-29 NOTE — Progress Notes (Signed)
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 Salley Scarlet of dermatology--used Band-Aid to cover had  several accidental trips and fall lifting the left thumbnail-saw PCP was placed on p.o. antibiotic + injection but because of worsening erythema pain etc. brought to ED Dr. Apolonio Schneiders of hand surgery reviewed the images and does not feel antibiotics were needed  Hospital-Problem based course  Trauma to left Thumb Does not require any antibiotics, symptomatic management/outpatient follow-up with Hand, Dr. Apolonio Schneiders who gave recommendations to be admitting physician continue Norco 1-2 every 4 as needed severe pain Frequent falls Unsafe to discharge because of 2 falls recently and mild cognitive impairments Therapy services rec's SNF--patient will d/c to home at d/c Strokes in the past Prior large MCA infarct with hemorrhagic conversion 2/28 Continue ticagrelor 45 mg twice daily, aspirin 81 mg, Keppra 500 twice daily [is on blood thinner because RCA stenosis ?]   Continue atorvastatin 10 daily R ICA stenosis diagnostic angiogram 03/31/21 under Neuro-IR Dr. Estanislado Pandy Subsequent planning to follow post-procedure Hypertension Continue amlodipine 10 nightly, clonidine  patch 0.1 q. 7 days Diabetes mellitus type 2 Glipizide XL 10 mg held on admission, continue 10 units of long-acting insulin  CBG well controlled 148-177  DVT prophylaxis: Lovenox Code Status: DNR Family Communication: None present at the bedside Disposition:  Status is: Inpatient  Remains inpatient appropriate because:Hemodynamically unstable, Unsafe d/c plan, and IV treatments appropriate due to intensity of illness or inability to take PO  Dispo: The patient is from: Home              Anticipated d/c is to:  To be determined unclear at this time may need skilled but if is cleared can go home as early as tomorrow with home health              Patient currently is not medically stable to d/c.   Difficult to place patient No  Consultants:  Hand surgeon Dr. Apolonio Schneiders  Procedures:   Antimicrobials:     Subjective:  Fair no distress no fever no chills, Thumb L hand not oozing today-looks drier   Objective: Vitals:   03/28/21 2002 03/28/21 2055 03/29/21 0500 03/29/21 0804  BP: (!) 150/68  (!) 152/76 (!) 169/78  Pulse: 70 72 71 85  Resp: '18 16 18 20  '$ Temp: 97.7 F (36.5 C)  97.7 F (36.5 C) 97.8 F (36.6 C)  TempSrc: Oral  Oral Oral  SpO2: 100% 100% 99% 94%  Weight:      Height:        Intake/Output Summary (Last 24 hours) at 03/29/2021 1041 Last data filed at 03/28/2021 1314 Gross per 24 hour  Intake 360 ml  Output --  Net 360 ml    Filed Weights   03/26/21 1600  Weight: 83.5 kg    Examination:  coherent and pleasant S1-S2 no murmur no rub  no gallop CTA B no added sound no rales no rhonchi Abdomen soft L thumb looks good, no serous d/c   Data Reviewed: personally reviewed   CBC    Component Value Date/Time   WBC 6.5 03/29/2021 0141   RBC 4.07 (L) 03/29/2021 0141   HGB 11.2 (L) 03/29/2021 0141   HGB 10.2 (L) 12/12/2018 1431   HCT 33.9 (L) 03/29/2021 0141   PLT 166 03/29/2021 0141   PLT 160 12/12/2018 1431   MCV 83.3 03/29/2021 0141   MCH 27.5  03/29/2021 0141   MCHC 33.0 03/29/2021 0141   RDW 14.0 03/29/2021 0141   LYMPHSABS 1.5 03/29/2021 0141   MONOABS 0.7 03/29/2021 0141   EOSABS 0.4 03/29/2021 0141   BASOSABS 0.0 03/29/2021 0141   CMP Latest Ref Rng & Units 03/29/2021 03/27/2021 03/26/2021  Glucose 70 - 99 mg/dL 177(H) 95 181(H)  BUN 8 - 23 mg/dL 26(H) 27(H) 29(H)  Creatinine 0.61 - 1.24 mg/dL 1.31(H) 1.37(H) 1.46(H)  Sodium 135 - 145 mmol/L 134(L) 137 135  Potassium 3.5 - 5.1 mmol/L 3.7 3.9 4.5  Chloride 98 - 111 mmol/L 97(L) 103 102  CO2 22 - 32 mmol/L '27 27 25  '$ Calcium 8.9 - 10.3 mg/dL 9.0 8.9 8.7(L)  Total Protein 6.5 - 8.1 g/dL - - 6.2(L)  Total Bilirubin 0.3 - 1.2 mg/dL - - 0.5  Alkaline Phos 38 - 126 U/L - - 77  AST 15 - 41 U/L - - 24  ALT 0 - 44 U/L - - 29     Radiology Studies: No results found.   Scheduled Meds:  amLODipine  10 mg Oral QHS   aspirin  81 mg Oral Daily   atorvastatin  10 mg Oral Daily   [START ON 04/01/2021] cloNIDine  0.1 mg Transdermal Weekly   darifenacin  15 mg Oral Daily   docusate sodium  100 mg Oral BID   enoxaparin (LOVENOX) injection  40 mg Subcutaneous Q24H   FLUoxetine  20 mg Oral Daily   insulin aspart  0-15 Units Subcutaneous TID WC   insulin glargine-yfgn  10 Units Subcutaneous QHS   levETIRAcetam  500 mg Oral BID   pantoprazole  40 mg Oral Daily   ticagrelor  45 mg Oral BID   Continuous Infusions:   LOS: 3 days   Time spent: 15  Nita Sells, MD [facility] To contact the attending provider between 7A-7P or the covering provider during after hours 7P-7A, please log into the web site [email or web address] and access using universal [place] password for that web site. If you do not have the password, please call the hospital operator.  03/29/2021, 10:41 AM

## 2021-03-29 NOTE — Evaluation (Signed)
Speech Language Pathology Evaluation Patient Details Name: Frank [name] MRN: [id number] DOB: 19-Aug-1940 Today's Date: 03/29/2021 Time: 1345-1415 SLP Time Calculation (min) (ACUTE ONLY): 30 min  Problem List:  Patient Active Problem List   Diagnosis Date Noted   Infection of thumb 03/26/2021   Cellulitis of left hand 03/25/2021   Atherosclerosis of aorta ([name]) 02/10/2021   Generalized weakness 12/26/2020   Simple chronic bronchitis ([name]) 11/13/2020   Iron deficiency anemia secondary to inadequate dietary iron intake 11/06/2020   DNR (do not resuscitate) discussion 10/17/2020   Stroke (cerebrum) ([name]) 09/09/2020   Middle cerebral artery embolism, 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   AV block, 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 ([name]) 11/28/2019   Chronic renal disease, stage 3, moderately decreased glomerular filtration rate (GFR) between 30-59 mL/min/1.73 square meter ([name]) 11/21/2018   GERD without esophagitis 07/22/2017   Depression    Type 2 diabetes mellitus with peripheral neuropathy ([name])    OAB (overactive bladder) 11/26/2014   Routine general medical examination at a health care facility 04/06/2014   Vitamin D deficiency 07/23/2010   Obstructive sleep apnea 09/13/2008   Hyperlipidemia with target LDL less than 100 07/27/2007   BPH (benign prostatic hyperplasia) 06/21/2007   OPTIC NEUROPATHY, 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   B12 deficiency anemia 04/15/2006   Essential hypertension 04/15/2006   Allergic rhinitis 04/15/2006   Past Medical History:  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, Ischemic optic   OSA (obstructive sleep apnea)    cpap   TBI (traumatic brain injury)  [facility])    Past Surgical History:  Past Surgical History:  Procedure Laterality Date   APPENDECTOMY     arm fracture Left 2011   with hardware   BURR HOLE Bilateral 08/04/2017   Procedure: Haskell Flirt;  Surgeon: [name], MD;  Location: [place];  Service: Neurosurgery;  Laterality: Bilateral;   COLONOSCOPY     CRANIOTOMY Left 12/01/2017   Procedure: Trudee Kuster Holes CRANIOTOMY HEMATOMA EVACUATION SUBDURAL;  Surgeon: [name], MD;  Location: [place];  Service: Neurosurgery;  Laterality: Left;   ESOPHAGOGASTRODUODENOSCOPY  2006   gastritis   IR PERCUTANEOUS ART THROMBECTOMY/INFUSION INTRACRANIAL INC DIAG ANGIO  09/09/2020   IR RADIOLOGIST EVAL & MGMT  03/10/2021   PACEMAKER IMPLANT N/A 05/14/2020   Procedure: PACEMAKER IMPLANT;  Surgeon: [name], MD;  Location: [place] [facility];  Service: Cardiovascular;  Laterality: N/A;   RADIOLOGY WITH ANESTHESIA N/A 09/09/2020   Procedure: IR WITH ANESTHESIA;  Surgeon: Radiologist, Medication, MD;  Location: [place];  Service: Radiology;  Laterality: N/A;   ROTATOR CUFF REPAIR     SEPTOPLASTY  1959   Deviated Septum   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 PMH significant of DM, HTN, HLD, OSA on CPAP, TBI, dementia, and CVA.   Assessment / Plan / Recommendation Clinical Impression  Mr. Grass presents with a mild cognitive impairment, consistent with his baseline diagnosis from prior CVA in 2017. Wife reports his memory/cognitive skills appear unchanged acutely, but that he has been  more impulsive and belligerent over the past week. She states she isn't sure if he is "impulsive or just bone-headed." He has had multiple falls recently due to getting up without assist. Pt, verbally, has good judgment and reports he needs assist to ambulate, but in the moment does not always wait for assist. He scored 25/30 on the [place] Status Examination,  consistent with mild cognitive impairment. Deficits were observed in complex problem solving (calculations), thought organization (divergent naming), and in orientation to day of week (stated it was Sunday, not Saturday). As cognitive impairment is baseline and pt has full assist for IADL tasks from wife, no further ST is indicated.     SLP Assessment  SLP Recommendation/Assessment: Patient does not need any further Speech Lanaguage Pathology Services SLP Visit Diagnosis: Attention and concentration deficit;Frontal lobe and executive function deficit Attention and concentration deficit following: Other cerebrovascular disease Frontal lobe and executive function deficit following: Other cerebrovascular disease    Recommendations for follow up therapy are one component of a multi-disciplinary discharge planning process, led by the attending physician.  Recommendations may be updated based on patient status, additional functional criteria and insurance authorization.    Follow Up Recommendations  None    Frequency and Duration           SLP Evaluation Cognition  Overall Cognitive Status: History of cognitive impairments - at baseline Arousal/Alertness: Awake/alert Orientation Level: Oriented X4 Attention: Focused Focused Attention: Impaired Focused Attention Impairment: Verbal complex Memory: Impaired Memory Impairment: Retrieval deficit;Decreased short term memory Decreased Short Term Memory: Verbal complex;Functional complex Problem Solving: Impaired Problem Solving Impairment: Verbal complex;Functional complex Behaviors: Impulsive Safety/Judgment: Appears intact       Comprehension  Auditory Comprehension Overall Auditory Comprehension: Appears within functional limits for tasks assessed Yes/No Questions: Within Functional Limits Commands: Within Functional Limits Visual Recognition/Discrimination Discrimination: Within Function Limits Reading Comprehension Reading Status:  Within funtional limits    Expression Expression Primary Mode of Expression: Verbal Verbal Expression Overall Verbal Expression: Appears within functional limits for tasks assessed Initiation: No impairment Automatic Speech: Name Level of Generative/Spontaneous Verbalization: Word Repetition: No impairment Naming: No impairment Pragmatics: No impairment Written Expression Dominant Hand: Right   Oral / Motor  Oral Motor/Sensory Function Overall Oral Motor/Sensory Function: Within functional limits Motor Speech Overall Motor Speech: Appears within functional limits for tasks assessed Respiration: Within functional limits Phonation: Normal Resonance: Within functional limits Articulation: Within functional limitis              Hamilton Marinello P. Laia Wiley, M.S., CCC-SLP Speech-Language Pathologist Acute Rehabilitation Services Pager: [place] 03/29/2021, 2:37 PM

## 2021-03-30 DIAGNOSIS — L089 Local infection of the skin and subcutaneous tissue, unspecified: Secondary | ICD-10-CM | POA: Diagnosis not present

## 2021-03-30 LAB — GLUCOSE, CAPILLARY
Glucose-Capillary: 117 mg/dL — ABNORMAL HIGH (ref 70–99)
Glucose-Capillary: 172 mg/dL — ABNORMAL HIGH (ref 70–99)
Glucose-Capillary: 162 mg/dL — ABNORMAL HIGH (ref 70–99)
Glucose-Capillary: 154 mg/dL — ABNORMAL HIGH (ref 70–99)

## 2021-03-30 LAB — BASIC METABOLIC PANEL
Anion gap: 10 (ref 5–15)
BUN: 30 mg/dL — ABNORMAL HIGH (ref 8–23)
CO2: 27 mmol/L (ref 22–32)
Calcium: 9.2 mg/dL (ref 8.9–10.3)
Chloride: 98 mmol/L (ref 98–111)
Creatinine, Ser: 1.72 mg/dL — ABNORMAL HIGH (ref 0.61–1.24)
GFR, Estimated: 39 mL/min — ABNORMAL LOW (ref >60)
Glucose, Bld: 165 mg/dL — ABNORMAL HIGH (ref 70–99)
Potassium: 4.5 mmol/L (ref 3.5–5.1)
Sodium: 135 mmol/L (ref 135–145)

## 2021-03-30 LAB — CBC WITH DIFFERENTIAL/PLATELET
Abs Immature Granulocytes: 0.03 10*3/uL (ref 0.00–0.07)
Basophils Absolute: 0.1 10*3/uL (ref 0.0–0.1)
Basophils Relative: 1 %
Eosinophils Absolute: 0.3 10*3/uL (ref 0.0–0.5)
Eosinophils Relative: 4 %
HCT: 37.8 % — ABNORMAL LOW (ref 39.0–52.0)
Hemoglobin: 12.2 g/dL — ABNORMAL LOW (ref 13.0–17.0)
Immature Granulocytes: 0 %
Lymphocytes Relative: 16 %
Lymphs Abs: 1.3 10*3/uL (ref 0.7–4.0)
MCH: 27.4 pg (ref 26.0–34.0)
MCHC: 32.3 g/dL (ref 30.0–36.0)
MCV: 84.9 fL (ref 80.0–100.0)
Monocytes Absolute: 0.9 10*3/uL (ref 0.1–1.0)
Monocytes Relative: 12 %
Neutro Abs: 5.5 10*3/uL (ref 1.7–7.7)
Neutrophils Relative %: 67 %
Platelets: 199 10*3/uL (ref 150–400)
RBC: 4.45 MIL/uL (ref 4.22–5.81)
RDW: 13.9 % (ref 11.5–15.5)
WBC: 8.1 10*3/uL (ref 4.0–10.5)
nRBC: 0 % (ref 0.0–0.2)

## 2021-03-30 LAB — PROTIME-INR
INR: 0.9 (ref 0.8–1.2)
Prothrombin Time: 12.5 seconds (ref 11.4–15.2)

## 2021-03-30 LAB — SURGICAL PCR SCREEN
MRSA, PCR: NEGATIVE
Staphylococcus aureus: NEGATIVE

## 2021-03-30 MED ORDER — CHLORHEXIDINE GLUCONATE CLOTH 2 % EX PADS
6.0000 | MEDICATED_PAD | Freq: Every day | CUTANEOUS | Status: DC
  Administered 2021-03-30 – 2021-04-01 (×2): 6 via TOPICAL

## 2021-03-30 MED ORDER — VANCOMYCIN HCL 1000 MG IV SOLR: 1000.0000 mg | INTRAVENOUS | Status: DC

## 2021-03-30 MED ORDER — CLOPIDOGREL BISULFATE 75 MG PO TABS: 75.0000 mg | ORAL_TABLET | ORAL | Status: DC

## 2021-03-30 MED ORDER — SODIUM CHLORIDE 0.9 % IV SOLN: INTRAVENOUS | Status: DC

## 2021-03-30 MED ORDER — NIMODIPINE 30 MG PO CAPS
0.0000 mg | ORAL_CAPSULE | ORAL | Status: DC
  Filled 2021-03-30: qty 2

## 2021-03-30 MED ORDER — VANCOMYCIN HCL IN DEXTROSE 1-5 GM/200ML-% IV SOLN
1000.0000 mg | INTRAVENOUS | Status: AC
  Administered 2021-03-31: 1000 mg via INTRAVENOUS

## 2021-03-30 MED ORDER — VANCOMYCIN HCL IN DEXTROSE 1-5 GM/200ML-% IV SOLN: 1000.0000 mg | INTRAVENOUS | Status: DC

## 2021-03-30 MED ORDER — ASPIRIN EC 325 MG PO TBEC: 325.0000 mg | DELAYED_RELEASE_TABLET | ORAL | Status: DC

## 2021-03-30 MED ORDER — INSULIN GLARGINE-YFGN 100 UNIT/ML ~~LOC~~ SOLN
5.0000 [IU] | Freq: Every day | SUBCUTANEOUS | Status: DC
  Administered 2021-03-30 – 2021-04-02 (×4): 5 [IU] via SUBCUTANEOUS
  Filled 2021-03-30 (×7): qty 0.05

## 2021-03-30 NOTE — H&P (Signed)
Patient Status: [facility] - In-pt  Assessment and Plan: Patient in need of cerebral angiogram with intent to treat right ICA stenosis.  Patient admitted last week with thumb infection.  Also scheduled for planned cerebral intervention today with Dr. Estanislado Pandy.  Due to concerns for discharge planning and needs at home, plan made to proceed with intervention as an inpatient with possible discharge home with wife tomorrow.  Patient will need Neuro ICU bed overnight for observation.  Patient and wife are aware of plans for intervention today and are agreeable to proceed.  He has been NPO.  To get aspirin and Brilinta dosing this AM.   Risks and benefits of cerebral angiogram with intervention were discussed with the patient  and patient's wife including, but not limited to bleeding, infection, vascular injury, contrast induced renal failure, stroke or even death.  This interventional procedure involves the use of X-rays and because of the nature of the planned procedure, it is possible that we will have prolonged use of X-ray fluoroscopy.  Potential radiation risks to you include (but are not limited to) the following: - A slightly elevated risk for cancer  several years later in life. This risk is typically less than 0.5% percent. This risk is low in comparison to the normal incidence of human cancer, which is 33% for women and 50% for men according to the [place]. - Radiation induced injury can include skin redness, resembling a rash, tissue breakdown / ulcers and hair loss (which can be temporary or permanent).   The likelihood of either of these occurring depends on the difficulty of the procedure and whether you are sensitive to radiation due to previous procedures, disease, or genetic conditions.   IF your procedure requires a prolonged use of radiation, you will be notified and given written instructions for further action.  It is your responsibility to monitor the  irradiated area for the 2 weeks following the procedure and to notify your physician if you are concerned that you have suffered a radiation induced injury.    All of the patient's wife's questions were answered, patient and wife are agreeable to proceed.  Consent signed and in chart.  ______________________________________________________________________   History of Present Illness: Frank [name] is a 80 y.o. 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 He was a good candidate for inpatient rehab and was able to participate, however this was complicated by bronchitis and his recovery was limited by ongoing weakness.  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 He was scheduled for diagnostic angiogram with intent to treat Monday. Plan is to proceed today.   Allergies and medications reviewed.   Review of Systems: A 12 point ROS discussed and pertinent positives are indicated in the HPI above.  All other systems are negative.  Review of Systems  Constitutional:  Negative for fatigue and fever.  Respiratory:  Negative for cough and shortness of breath.   Cardiovascular:  Negative for chest pain.  Gastrointestinal:  Negative for abdominal pain.  Genitourinary:  Negative for dysuria.  Musculoskeletal:  Negative for back pain.  Psychiatric/Behavioral:  Negative for behavioral problems and confusion.  Vital Signs: BP (!) 113/59 (BP Location: Right Arm)   Pulse 85   Temp 97.8 F (36.6 C) (Oral)   Resp 17   Ht '5\' 9"'$  (1.753 m)   Wt 184 lb (83.5 kg)   SpO2 100%   BMI 27.17 kg/m   Physical Exam Vitals and nursing note reviewed.   Constitutional:      General: He is not in acute distress.    Appearance: Normal appearance. He is normal weight.  HENT:     Mouth/Throat:     Mouth: Mucous membranes are moist.     Pharynx: Oropharynx is clear.  Cardiovascular:     Rate and Rhythm: Normal rate and regular rhythm.  Pulmonary:     Effort: Pulmonary effort is normal.     Breath sounds: Normal breath sounds.  Skin:    General: Skin is warm and dry.  Neurological:     General: No focal deficit present.     Mental Status: He is alert and oriented to person, place, and time. Mental status is at baseline.  Psychiatric:        Mood and Affect: Mood normal.        Behavior: Behavior normal.        Thought Content: Thought content normal.        Judgment: Judgment normal.     Imaging reviewed.   Labs:  COAGS: Recent Labs    09/09/20 1101 11/18/20 0945 03/30/21 1645  INR 1.0 1.2 0.9  APTT 32 29  --     BMP: Recent Labs    03/26/21 0915 03/27/21 0327 03/29/21 0141 03/30/21 1645  NA 135 137 134* 135  K 4.5 3.9 3.7 4.5  CL 102 103 97* 98  CO2 '25 27 27 27  '$ GLUCOSE 181* 95 177* 165*  BUN 29* 27* 26* 30*  CALCIUM 8.7* 8.9 9.0 9.2  CREATININE 1.46* 1.37* 1.31* 1.72*  GFRNONAA 48* 52* 54* 39*       Electronically Signed: Docia Barrier, PA 03/30/2021, 10:31 PM   I spent a total of 15 minutes in face to face in clinical consultation, greater than 50% of which was counseling/coordinating care for right ICA stenosis.

## 2021-03-30 NOTE — TOC Initial Note (Signed)
Transition of Care [facility]) - Initial/Assessment Note    Patient Details  Name: Frank [name] MRN: [id number] Date of Birth: January 14, 1941  Transition of Care [facility]) CM/SW Contact:    Joanne Chars, LCSW Phone Number: 03/30/2021, 2:11 PM  Clinical Narrative:    CSW message with Mallie Snooks regarding family request to be considered for CIR.  She has reviewed the chart and pt does not meet CIR criteria.   CSW met with pt regarding recommendation for SNF.  Pt said he would like CSW to discuss plan with wife, permission given to speak with wife Frank [name].  Choice document given.  Pt had bad experience at prior SNF and we discussed that there are other options.  [facility] is also currently in place.    CSW spoke with wife Frank [name] on the phone.  She asked about CIR and CSW shared that pt has been reviewed by [facility] and does not meet criteria.  Frank [name], too, was quite skeptical of SNF, said they had poor experience.  She would consider SNF admission to [facility] or [facility] only.  If those are not options, she would prefer pt to come back home with [facility].                  Expected Discharge Plan: Skilled Nursing Facility Barriers to Discharge: Continued Medical Work up, SNF Pending bed offer   Patient Goals and CMS Choice Patient states their goals for this hospitalization and ongoing recovery are:: "back to what I was" CMS Medicare.gov Compare Post Acute Care list provided to:: Patient Choice offered to / list presented to : Patient  Expected Discharge Plan and Services Expected Discharge Plan: [place] Choice: [place] (only if [facility] or [place]) Living arrangements for the past 2 months: Single Family Home                                      Prior Living Arrangements/Services Living arrangements for the past 2 months: Single Family Home Lives with:: Spouse Patient language and need for interpreter reviewed:: Yes Do  you feel safe going back to the place where you live?: Yes      Need for Family Participation in Patient Care: Yes (Comment) Care giver support system in place?: Yes (comment) Current home services: Home OT, Home PT ([facility] currently active for [facility]) Criminal Activity/Legal Involvement Pertinent to Current Situation/Hospitalization: No - Comment as needed  Activities of Daily Living      Permission Sought/Granted Permission sought to share information with : Family Supports Permission granted to share information with : Yes, Verbal Permission Granted  Share Information with NAME: wife Frank [name]  Permission granted to share info w AGENCY: SNF        Emotional Assessment Appearance:: Appears stated age Attitude/Demeanor/Rapport: Engaged Affect (typically observed): Appropriate Orientation: : Oriented to Self, Oriented to Place, Oriented to  Time, Oriented to Situation Alcohol / Substance Use: Not Applicable Psych Involvement: No (comment)  Admission diagnosis:  Confusion [R41.0] Infection of thumb [L08.9] Cellulitis of other specified site [L03.818] Patient Active Problem List   Diagnosis Date Noted   Infection of thumb 03/26/2021   Cellulitis of left hand 03/25/2021   Atherosclerosis of aorta ([place]) 02/10/2021   Generalized weakness 12/26/2020   Simple chronic bronchitis ([place]) 11/13/2020   Iron deficiency anemia secondary to inadequate dietary iron intake 11/06/2020  DNR (do not resuscitate) discussion 10/17/2020   Stroke (cerebrum) ([place]) 09/09/2020   Middle cerebral artery embolism, 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   AV block, 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 ([place]) 11/28/2019   Chronic renal disease, stage 3, moderately decreased glomerular filtration rate (GFR) between 30-59 mL/min/1.73 square meter ([place]) 11/21/2018   GERD without esophagitis 07/22/2017   Depression    Type 2 diabetes mellitus with peripheral neuropathy ([place])     OAB (overactive bladder) 11/26/2014   Routine general medical examination at a health care facility 04/06/2014   Vitamin D deficiency 07/23/2010   Obstructive sleep apnea 09/13/2008   Hyperlipidemia with target LDL less than 100 07/27/2007   BPH (benign prostatic hyperplasia) 06/21/2007   OPTIC NEUROPATHY, 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   B12 deficiency anemia 04/15/2006   Essential hypertension 04/15/2006   Allergic rhinitis 04/15/2006   PCP:  Janith Lima, MD Pharmacy:   [place], [place] [address] Phone: [phone] Fax: [phone]  [facility] - [place], [address] [place] [address] Phone: [phone] Fax: [phone]  [facility]  ([place], [place] [facility] [address] [address] Phone: [phone] Fax: [place], [address] [place] [address] Phone: [phone] Fax: [phone]     Social Determinants of Health (SDOH) Interventions    Readmission Risk Interventions No flowsheet data found.

## 2021-03-30 NOTE — Progress Notes (Signed)
Pt refuses CPAP for tonight.   

## 2021-03-30 NOTE — Progress Notes (Signed)
PROGRESS NOTE   Frank [name]  [id number] DOB: January 09, 1941 DOA: 03/26/2021 PCP: Janith Lima, MD  Brief Narrative:  80 year old community dwelling white male Large MCA infarct 2/28--3/11 with hemorrhagic conversion--IR revascularization dominant R MCA and placement of stent with R ICA balloon angioplasty-complicated by cerebral edema dysphagia seizure chronic subdural plan aspiration Also readmitted 5/9 for worsening left leg weakness found to have E. 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 Salley Scarlet of dermatology--used Band-Aid to cover had  several accidental trips and fall lifting the left thumbnail-saw PCP was placed on p.o. antibiotic + injection but because of worsening erythema pain etc. brought to ED Dr. Apolonio Schneiders of hand surgery reviewed the images and does not feel antibiotics were needed  Hospital-Problem based course  Trauma to left Thumb Does not require any antibiotics, symptomatic management/outpatient follow-up with Hand, Dr. Apolonio Schneiders who gave recommendations to be admitting physician continue Norco 1-2 every 4 as needed severe pain Frequent falls Unsafe to discharge because of 2 falls recently and mild cognitive impairments Therapy services rec's SNF--patient will d/c to home at d/c on as per his wishes Strokes in the past Prior large MCA infarct with hemorrhagic conversion 2/28 Continue ticagrelor 45 mg twice daily, aspirin 81 mg, Keppra 500 twice daily [is on blood thinner because RCA stenosis ?]  --- From neuro IR last note dated 9/16 should be on these through the procedure but hold Lovenox tonight Continue atorvastatin 10 daily R ICA stenosis diagnostic angiogram 03/31/21 under  Neuro-IR Dr. Estanislado Pandy Hypertension Continue amlodipine 10 nightly, clonidine patch 0.1 q. 7 days Diabetes mellitus type 2 Glipizide XL 10 mg held on admission, half dose Lantus 25 units because of intervention CBG well controlled 117-172 N.p.o. after midnight start LR with D5 50 cc/H   DVT prophylaxis: Lovenox Code Status: DNR Family Communication: None present at the bedside Disposition:  Status is: Inpatient  Remains inpatient appropriate because:Hemodynamically unstable, Unsafe d/c plan, and IV treatments appropriate due to intensity of illness or inability to take PO  Dispo: The patient is from: Home              Anticipated d/c is to: To be determined likely discharge postprocedure 9/28 if all remains stable              Difficult to place patient No  Consultants:  Hand surgeon Dr. Apolonio Schneiders  Procedures:   Antimicrobials:     Subjective:  Doing fair No distress No chest pain no fever Up out of bed today has questions about the procedure no nausea no vomiting   Objective: Vitals:   03/29/21 1700 03/29/21 2131 03/30/21 0441 03/30/21 0900  BP: 130/84 137/75 (!) 123/58 (!) 105/55  Pulse: 81 84 68 73  Resp: '18 18 17 18  '$ Temp: 98.2 F (36.8 C) 97.9 F (36.6 C) 97.8 F (36.6 C) 97.8 F (36.6 C)  TempSrc: Oral Oral Oral Oral  SpO2: 98% 96% 96% 98%  Weight:      Height:        Intake/Output Summary (Last 24 hours) at 03/30/2021 1516 Last data filed at 03/30/2021 1030 Gross per 24 hour  Intake 240 ml  Output 550 ml  Net -310 ml    Filed Weights   03/26/21 1600  Weight: 83.5 kg    Examination:  coherent and pleasant S1-S2 no murmur no rub no gallop CTA B no added sound no rales no rhonchi Abdomen soft L thumb looks good, no serous d/c   Data Reviewed: personally reviewed   CBC    Component Value Date/Time   WBC 6.5 03/29/2021 0141   RBC 4.07 (L) 03/29/2021 0141   HGB 11.2 (L) 03/29/2021 0141   HGB 10.2 (L) 12/12/2018 1431   HCT 33.9 (L)  03/29/2021 0141   PLT 166 03/29/2021 0141   PLT 160 12/12/2018 1431   MCV 83.3 03/29/2021 0141   MCH 27.5 03/29/2021 0141   MCHC 33.0 03/29/2021 0141   RDW 14.0 03/29/2021 0141   LYMPHSABS 1.5 03/29/2021 0141   MONOABS 0.7 03/29/2021 0141   EOSABS 0.4 03/29/2021 0141   BASOSABS 0.0 03/29/2021 0141   CMP Latest Ref Rng & Units 03/29/2021 03/27/2021 03/26/2021  Glucose 70 - 99 mg/dL 177(H) 95 181(H)  BUN 8 - 23 mg/dL 26(H) 27(H) 29(H)  Creatinine 0.61 - 1.24 mg/dL 1.31(H) 1.37(H) 1.46(H)  Sodium 135 - 145 mmol/L 134(L) 137 135  Potassium 3.5 - 5.1 mmol/L 3.7 3.9 4.5  Chloride 98 - 111 mmol/L 97(L) 103 102  CO2 22 - 32 mmol/L '27 27 25  '$ Calcium 8.9 - 10.3 mg/dL 9.0 8.9 8.7(L)  Total Protein 6.5 - 8.1 g/dL - - 6.2(L)  Total Bilirubin 0.3 - 1.2 mg/dL - - 0.5  Alkaline Phos 38 - 126 U/L - - 77  AST 15 - 41 U/L - - 24  ALT 0 - 44 U/L - - 29     Radiology Studies: No results found.   Scheduled Meds:  amLODipine  10 mg Oral QHS   aspirin  81 mg Oral Daily   atorvastatin  10 mg Oral Daily   [START ON 04/01/2021] cloNIDine  0.1 mg Transdermal Weekly   darifenacin  15 mg Oral Daily   docusate sodium  100 mg Oral BID   enoxaparin (LOVENOX) injection  40 mg Subcutaneous Q24H   FLUoxetine  20 mg Oral Daily   insulin aspart  0-15 Units Subcutaneous TID WC   insulin glargine-yfgn  10 Units Subcutaneous QHS   levETIRAcetam  500 mg Oral BID   pantoprazole  40 mg Oral Daily   ticagrelor  45 mg Oral BID   Continuous Infusions:   LOS: 4 days   Time spent: 13  Nita Sells, MD [facility] To contact the attending provider between 7A-7P or the covering provider during after hours 7P-7A, please log into the web site [email or web address] and access using universal [place] password for that web site. If you do not have the password, please call the hospital operator.  03/30/2021, 3:16 PM

## 2021-03-30 NOTE — [place] FL2 (Signed)
[place] MEDICAID FL2 LEVEL OF CARE SCREENING TOOL     IDENTIFICATION  Patient Name: Frank [name] Birthdate: 01-Jun-1941 Sex: male Admission Date (Current Location): 03/26/2021  [facility] and [place] Number:  Herbalist and Address:  [facility] [place], [place] [address], [place], [place] [address]      Provider Number: [id number]  Attending Physician Name and Address:  Nita Sells, MD  Relative Name and Phone Number:  Yoseph, Buchanan [phone]  [phone]    Current Level of Care: Hospital Recommended Level of Care: [place] Prior Approval Number:    Date Approved/Denied:   PASRR Number: [id number]  Discharge Plan: SNF    Current Diagnoses: Patient Active Problem List   Diagnosis Date Noted   Infection of thumb 03/26/2021   Cellulitis of left hand 03/25/2021   Atherosclerosis of aorta ([place]) 02/10/2021   Generalized weakness 12/26/2020   Simple chronic bronchitis ([place]) 11/13/2020   Iron deficiency anemia secondary to inadequate dietary iron intake 11/06/2020   DNR (do not resuscitate) discussion 10/17/2020   Stroke (cerebrum) ([place]) 09/09/2020   Middle cerebral artery embolism, 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   AV block, 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 ([place]) 11/28/2019   Chronic renal disease, stage 3, moderately decreased glomerular filtration rate (GFR) between 30-59 mL/min/1.73 square meter ([place]) 11/21/2018   GERD without esophagitis 07/22/2017   Depression    Type 2 diabetes mellitus with peripheral neuropathy ([name])    OAB (overactive bladder) 11/26/2014   Routine general medical examination at a health care facility 04/06/2014   Vitamin D deficiency 07/23/2010   Obstructive sleep apnea 09/13/2008   Hyperlipidemia with target LDL less than 100 07/27/2007   BPH (benign prostatic hyperplasia) 06/21/2007   OPTIC NEUROPATHY, ISCHEMIC  12/23/2006   B12 deficiency anemia 04/15/2006   Essential hypertension 04/15/2006   Allergic rhinitis 04/15/2006    Orientation RESPIRATION BLADDER Height & Weight     Self, Time, Situation, Place  Normal External catheter Weight: 184 lb (83.5 kg) Height:  '5\' 9"'$  (175.3 cm)  BEHAVIORAL SYMPTOMS/MOOD NEUROLOGICAL BOWEL NUTRITION STATUS      Continent Diet (see discharge summary)  AMBULATORY STATUS COMMUNICATION OF NEEDS Skin   Total Care Verbally Normal, Other (Comment) (ecchymosis)                       Personal Care Assistance Level of Assistance  Bathing, Feeding, Dressing Bathing Assistance: Maximum assistance Feeding assistance: Independent Dressing Assistance: Maximum assistance     Functional Limitations Info  Sight, Hearing, Speech Sight Info: Adequate Hearing Info: Adequate Speech Info: Adequate    SPECIAL CARE FACTORS FREQUENCY  PT (By licensed PT), OT (By licensed OT)     PT Frequency: 5x week OT Frequency: 5x week            Contractures Contractures Info: Not present    Additional Factors Info  Code Status, Allergies, Insulin Sliding Scale Code Status Info: DNR Allergies Info: Lisinopril, Invokana (Canagliflozin), Penicillins, Sulfamethoxazole   Insulin Sliding Scale Info: Novolog, 0-15 units 3x day with meals.  See discharge summary.       Current Medications (03/30/2021):  This is the current hospital active medication list Current Facility-Administered Medications  Medication Dose Route Frequency Provider Last Rate Last Admin   acetaminophen (TYLENOL) tablet 650 mg  650 mg Oral Q6H PRN Karmen Bongo, MD       Or   acetaminophen (TYLENOL) suppository  650 mg  650 mg Rectal Q6H PRN Karmen Bongo, MD       amLODipine (NORVASC) tablet 10 mg  10 mg Oral Ivery Quale, MD   10 mg at 03/29/21 2245   aspirin chewable tablet 81 mg  81 mg Oral Daily Karmen Bongo, MD   81 mg at 03/30/21 0912   atorvastatin (LIPITOR) tablet 10 mg  10 mg  Oral Daily Karmen Bongo, MD   10 mg at 03/30/21 0912   bisacodyl (DULCOLAX) EC tablet 5 mg  5 mg Oral Daily PRN Karmen Bongo, MD       Derrill Memo ON 04/01/2021] cloNIDine (CATAPRES - Dosed in mg/24 hr) patch 0.1 mg  0.1 mg Transdermal Weekly Karmen Bongo, MD       darifenacin (ENABLEX) 24 hr tablet 15 mg  15 mg Oral Daily Karmen Bongo, MD   15 mg at 03/30/21 0911   docusate sodium (COLACE) capsule 100 mg  100 mg Oral BID Karmen Bongo, MD   100 mg at 03/30/21 0911   enoxaparin (LOVENOX) injection 40 mg  40 mg Subcutaneous Q24H Karmen Bongo, MD   40 mg at 03/29/21 2001   FLUoxetine (PROZAC) capsule 20 mg  20 mg Oral Daily Karmen Bongo, MD   20 mg at 03/30/21 [id number]   hydrALAZINE (APRESOLINE) injection 5 mg  5 mg Intravenous Q4H PRN Karmen Bongo, MD       HYDROcodone-acetaminophen (NORCO/VICODIN) 5-325 MG per tablet 1-2 tablet  1-2 tablet Oral Q4H PRN Karmen Bongo, MD       insulin aspart (novoLOG) injection 0-15 Units  0-15 Units Subcutaneous TID [facility] Karmen Bongo, MD   3 Units at 03/30/21 1215   insulin glargine-yfgn (SEMGLEE) injection 10 Units  10 Units Subcutaneous QHS Karmen Bongo, MD   10 Units at 03/29/21 2246   levETIRAcetam (KEPPRA) tablet 500 mg  500 mg Oral BID Karmen Bongo, MD   500 mg at 03/30/21 0911   ondansetron (ZOFRAN) tablet 4 mg  4 mg Oral Q6H PRN Karmen Bongo, MD       Or   ondansetron [facility]) injection 4 mg  4 mg Intravenous Q6H PRN Karmen Bongo, MD       pantoprazole (PROTONIX) EC tablet 40 mg  40 mg Oral Daily Karmen Bongo, MD   40 mg at 03/30/21 0912   polyethylene glycol (MIRALAX / GLYCOLAX) packet 17 g  17 g Oral Daily PRN Karmen Bongo, MD       ticagrelor [name]) tablet 45 mg  45 mg Oral BID Karmen Bongo, MD   45 mg at 03/30/21 [id number]     Discharge Medications: Please see discharge summary for a list of discharge medications.  Relevant Imaging Results:  Relevant Lab Results:   Additional Information SSN: [id number],  pt is vaccinated for covid with one booster.  Joanne Chars, LCSW

## 2021-03-31 ENCOUNTER — Encounter (HOSPITAL_COMMUNITY): Payer: Self-pay

## 2021-03-31 ENCOUNTER — Inpatient Hospital Stay (HOSPITAL_COMMUNITY): Payer: Medicare Other | Admitting: Certified Registered"

## 2021-03-31 ENCOUNTER — Encounter (HOSPITAL_COMMUNITY): Payer: Self-pay | Admitting: Internal Medicine

## 2021-03-31 ENCOUNTER — Observation Stay (HOSPITAL_COMMUNITY)
Admission: RE | Admit: 2021-03-31 | Discharge: 2021-03-31 | Disposition: A | Discharge status: Home / Self Care | Payer: Medicare Other | Source: Ambulatory Visit | Attending: Interventional Radiology | Admitting: Interventional Radiology

## 2021-03-31 ENCOUNTER — Inpatient Hospital Stay (HOSPITAL_COMMUNITY)
Admission: RE | Admit: 2021-03-31 | Payer: Medicare Other | Source: Ambulatory Visit | Admitting: Interventional Radiology

## 2021-03-31 ENCOUNTER — Encounter (HOSPITAL_COMMUNITY)
Admission: EM | Disposition: A | Discharge status: Skilled Nursing Facility | Payer: Self-pay | Source: Home / Self Care | Attending: Family Medicine

## 2021-03-31 DIAGNOSIS — Z006 Encounter for examination for normal comparison and control in clinical research program: Secondary | ICD-10-CM | POA: Diagnosis not present

## 2021-03-31 DIAGNOSIS — I672 Cerebral atherosclerosis: Secondary | ICD-10-CM | POA: Diagnosis not present

## 2021-03-31 DIAGNOSIS — I6601 Occlusion and stenosis of right middle cerebral artery: Secondary | ICD-10-CM | POA: Diagnosis not present

## 2021-03-31 DIAGNOSIS — I771 Stricture of artery: Secondary | ICD-10-CM

## 2021-03-31 DIAGNOSIS — I63239 Cerebral infarction due to unspecified occlusion or stenosis of unspecified carotid arteries: Secondary | ICD-10-CM | POA: Diagnosis present

## 2021-03-31 DIAGNOSIS — I6521 Occlusion and stenosis of right carotid artery: Secondary | ICD-10-CM | POA: Diagnosis not present

## 2021-03-31 DIAGNOSIS — L089 Local infection of the skin and subcutaneous tissue, unspecified: Secondary | ICD-10-CM | POA: Diagnosis not present

## 2021-03-31 DIAGNOSIS — Z66 Do not resuscitate: Secondary | ICD-10-CM | POA: Diagnosis not present

## 2021-03-31 DIAGNOSIS — Z23 Encounter for immunization: Secondary | ICD-10-CM | POA: Diagnosis not present

## 2021-03-31 HISTORY — PX: IR CT HEAD LTD: IMG2386

## 2021-03-31 HISTORY — PX: IR ANGIO INTRA EXTRACRAN SEL COM CAROTID INNOMINATE UNI L MOD SED: IMG5358

## 2021-03-31 HISTORY — PX: IR INTRAVSC STENT CERV CAROTID W/EMB-PROT MOD SED INCL ANGIO: IMG2303

## 2021-03-31 HISTORY — PX: IR ANGIO VERTEBRAL SEL SUBCLAVIAN INNOMINATE UNI R MOD SED: IMG5365

## 2021-03-31 HISTORY — PX: RADIOLOGY WITH ANESTHESIA: SHX6223

## 2021-03-31 LAB — CBC WITH DIFFERENTIAL/PLATELET
Abs Immature Granulocytes: 0.03 10*3/uL (ref 0.00–0.07)
Basophils Absolute: 0.1 10*3/uL (ref 0.0–0.1)
Basophils Relative: 1 %
Eosinophils Absolute: 0.5 10*3/uL (ref 0.0–0.5)
Eosinophils Relative: 6 %
HCT: 30.4 % — ABNORMAL LOW (ref 39.0–52.0)
Hemoglobin: 9.8 g/dL — ABNORMAL LOW (ref 13.0–17.0)
Immature Granulocytes: 0 %
Lymphocytes Relative: 25 %
Lymphs Abs: 1.8 10*3/uL (ref 0.7–4.0)
MCH: 27.4 pg (ref 26.0–34.0)
MCHC: 32.2 g/dL (ref 30.0–36.0)
MCV: 84.9 fL (ref 80.0–100.0)
Monocytes Absolute: 1 10*3/uL (ref 0.1–1.0)
Monocytes Relative: 14 %
Neutro Abs: 3.9 10*3/uL (ref 1.7–7.7)
Neutrophils Relative %: 54 %
Platelets: 158 10*3/uL (ref 150–400)
RBC: 3.58 MIL/uL — ABNORMAL LOW (ref 4.22–5.81)
RDW: 13.9 % (ref 11.5–15.5)
WBC: 7.2 10*3/uL (ref 4.0–10.5)
nRBC: 0 % (ref 0.0–0.2)

## 2021-03-31 LAB — CULTURE, BLOOD (ROUTINE X 2)
Culture: NO GROWTH
Culture: NO GROWTH
Special Requests: ADEQUATE

## 2021-03-31 LAB — GLUCOSE, CAPILLARY
Glucose-Capillary: 133 mg/dL — ABNORMAL HIGH (ref 70–99)
Glucose-Capillary: 144 mg/dL — ABNORMAL HIGH (ref 70–99)
Glucose-Capillary: 160 mg/dL — ABNORMAL HIGH (ref 70–99)
Glucose-Capillary: 165 mg/dL — ABNORMAL HIGH (ref 70–99)
Glucose-Capillary: 168 mg/dL — ABNORMAL HIGH (ref 70–99)
Glucose-Capillary: 178 mg/dL — ABNORMAL HIGH (ref 70–99)

## 2021-03-31 LAB — COMPREHENSIVE METABOLIC PANEL
ALT: 18 U/L (ref 0–44)
AST: 15 U/L (ref 15–41)
Albumin: 2.4 g/dL — ABNORMAL LOW (ref 3.5–5.0)
Alkaline Phosphatase: 77 U/L (ref 38–126)
Anion gap: 11 (ref 5–15)
BUN: 31 mg/dL — ABNORMAL HIGH (ref 8–23)
CO2: 25 mmol/L (ref 22–32)
Calcium: 8.5 mg/dL — ABNORMAL LOW (ref 8.9–10.3)
Chloride: 99 mmol/L (ref 98–111)
Creatinine, Ser: 1.75 mg/dL — ABNORMAL HIGH (ref 0.61–1.24)
GFR, Estimated: 38 mL/min — ABNORMAL LOW (ref >60)
Glucose, Bld: 152 mg/dL — ABNORMAL HIGH (ref 70–99)
Potassium: 3.8 mmol/L (ref 3.5–5.1)
Sodium: 135 mmol/L (ref 135–145)
Total Bilirubin: 0.5 mg/dL (ref 0.3–1.2)
Total Protein: 5.2 g/dL — ABNORMAL LOW (ref 6.5–8.1)

## 2021-03-31 LAB — POCT ACTIVATED CLOTTING TIME
Activated Clotting Time: 173 seconds
Activated Clotting Time: 196 seconds

## 2021-03-31 LAB — TYPE AND SCREEN
ABO/RH(D): B POS
Antibody Screen: NEGATIVE

## 2021-03-31 LAB — MRSA NEXT GEN BY PCR, NASAL: MRSA by PCR Next Gen: NOT DETECTED

## 2021-03-31 LAB — HEPARIN LEVEL (UNFRACTIONATED): Heparin Unfractionated: 0.24 IU/mL — ABNORMAL LOW (ref 0.30–0.70)

## 2021-03-31 SURGERY — IR WITH ANESTHESIA
Anesthesia: General

## 2021-03-31 MED ORDER — ACETAMINOPHEN 650 MG RE SUPP: 650.0000 mg | RECTAL | Status: DC | PRN

## 2021-03-31 MED ORDER — PHENYLEPHRINE HCL-NACL 20-0.9 MG/250ML-% IV SOLN
INTRAVENOUS | Status: DC | PRN
  Administered 2021-03-31: 50 ug/min via INTRAVENOUS

## 2021-03-31 MED ORDER — IOHEXOL 240 MG/ML SOLN
50.0000 mL | Freq: Once | INTRAMUSCULAR | Status: AC | PRN
  Administered 2021-03-31: 38 mL via INTRAVENOUS

## 2021-03-31 MED ORDER — ACETAMINOPHEN 325 MG PO TABS
650.0000 mg | ORAL_TABLET | ORAL | Status: DC | PRN
  Administered 2021-04-02 (×2): 650 mg via ORAL
  Filled 2021-03-31 (×2): qty 2

## 2021-03-31 MED ORDER — LIDOCAINE HCL (PF) 1 % IJ SOLN
INTRAMUSCULAR | Status: DC | PRN
  Administered 2021-03-31: 10 mL

## 2021-03-31 MED ORDER — SUGAMMADEX SODIUM 200 MG/2ML IV SOLN
INTRAVENOUS | Status: DC | PRN
  Administered 2021-03-31: 200 mg via INTRAVENOUS

## 2021-03-31 MED ORDER — GLYCOPYRROLATE PF 0.2 MG/ML IJ SOSY
PREFILLED_SYRINGE | INTRAMUSCULAR | Status: DC | PRN
  Administered 2021-03-31: .2 mg via INTRAVENOUS

## 2021-03-31 MED ORDER — EPHEDRINE SULFATE-NACL 50-0.9 MG/10ML-% IV SOSY
PREFILLED_SYRINGE | INTRAVENOUS | Status: DC | PRN
  Administered 2021-03-31: 10 mg via INTRAVENOUS

## 2021-03-31 MED ORDER — ASPIRIN 81 MG PO CHEW
81.0000 mg | CHEWABLE_TABLET | Freq: Every day | ORAL | Status: DC
  Filled 2021-03-31: qty 1

## 2021-03-31 MED ORDER — HEPARIN (PORCINE) 25000 UT/250ML-% IV SOLN
500.0000 [IU]/h | INTRAVENOUS | Status: DC
  Administered 2021-03-31: 500 [IU]/h via INTRAVENOUS
  Filled 2021-03-31: qty 250

## 2021-03-31 MED ORDER — LABETALOL HCL 5 MG/ML IV SOLN
INTRAVENOUS | Status: DC | PRN
  Administered 2021-03-31 (×2): 5 mg via INTRAVENOUS

## 2021-03-31 MED ORDER — AMISULPRIDE (ANTIEMETIC) 5 MG/2ML IV SOLN: 10.0000 mg | Freq: Once | INTRAVENOUS | Status: DC | PRN

## 2021-03-31 MED ORDER — VANCOMYCIN HCL IN DEXTROSE 1-5 GM/200ML-% IV SOLN
INTRAVENOUS | Status: AC
  Filled 2021-03-31: qty 200

## 2021-03-31 MED ORDER — CLEVIDIPINE BUTYRATE 0.5 MG/ML IV EMUL
INTRAVENOUS | Status: AC
  Filled 2021-03-31: qty 50

## 2021-03-31 MED ORDER — PROPOFOL 10 MG/ML IV BOLUS
INTRAVENOUS | Status: DC | PRN
  Administered 2021-03-31: 30 mg via INTRAVENOUS
  Administered 2021-03-31: 120 mg via INTRAVENOUS

## 2021-03-31 MED ORDER — HEPARIN (PORCINE) 25000 UT/250ML-% IV SOLN
500.0000 [IU]/h | INTRAVENOUS | Status: DC
  Administered 2021-03-31: 500 [IU]/h via INTRAVENOUS

## 2021-03-31 MED ORDER — SODIUM CHLORIDE 0.9 % IV SOLN: INTRAVENOUS | Status: DC

## 2021-03-31 MED ORDER — CHLORHEXIDINE GLUCONATE 0.12 % MT SOLN
15.0000 mL | Freq: Once | OROMUCOSAL | Status: AC
  Administered 2021-03-31: 15 mL via OROMUCOSAL

## 2021-03-31 MED ORDER — ORAL CARE MOUTH RINSE: 15.0000 mL | Freq: Once | OROMUCOSAL | Status: AC

## 2021-03-31 MED ORDER — ORAL CARE MOUTH RINSE
15.0000 mL | Freq: Two times a day (BID) | OROMUCOSAL | Status: DC
  Administered 2021-03-31 – 2021-04-02 (×5): 15 mL via OROMUCOSAL

## 2021-03-31 MED ORDER — EPTIFIBATIDE 20 MG/10ML IV SOLN
INTRAVENOUS | Status: AC
  Filled 2021-03-31: qty 10

## 2021-03-31 MED ORDER — ROCURONIUM BROMIDE 10 MG/ML (PF) SYRINGE
PREFILLED_SYRINGE | INTRAVENOUS | Status: DC | PRN
  Administered 2021-03-31: 50 mg via INTRAVENOUS

## 2021-03-31 MED ORDER — FENTANYL CITRATE (PF) 100 MCG/2ML IJ SOLN: 25.0000 ug | INTRAMUSCULAR | Status: DC | PRN

## 2021-03-31 MED ORDER — FENTANYL CITRATE (PF) 250 MCG/5ML IJ SOLN
INTRAMUSCULAR | Status: DC | PRN
  Administered 2021-03-31: 50 ug via INTRAVENOUS

## 2021-03-31 MED ORDER — TICAGRELOR 90 MG PO TABS: 90.0000 mg | ORAL_TABLET | Freq: Two times a day (BID) | ORAL | Status: DC

## 2021-03-31 MED ORDER — DEXAMETHASONE SODIUM PHOSPHATE 10 MG/ML IJ SOLN
INTRAMUSCULAR | Status: DC | PRN
  Administered 2021-03-31: 5 mg via INTRAVENOUS

## 2021-03-31 MED ORDER — NITROGLYCERIN 1 MG/10 ML FOR IR/CATH LAB
INTRA_ARTERIAL | Status: AC
  Filled 2021-03-31: qty 10

## 2021-03-31 MED ORDER — CLEVIDIPINE BUTYRATE 0.5 MG/ML IV EMUL
0.0000 mg/h | INTRAVENOUS | Status: AC
  Administered 2021-03-31: 1 mg/h via INTRAVENOUS
  Administered 2021-03-31 – 2021-04-01 (×2): 3 mg/h via INTRAVENOUS
  Filled 2021-03-31: qty 100

## 2021-03-31 MED ORDER — INFLUENZA VAC A&B SA ADJ QUAD 0.5 ML IM PRSY
0.5000 mL | PREFILLED_SYRINGE | INTRAMUSCULAR | Status: AC
  Administered 2021-04-01: 0.5 mL via INTRAMUSCULAR
  Filled 2021-03-31: qty 0.5

## 2021-03-31 MED ORDER — LIDOCAINE 2% (20 MG/ML) 5 ML SYRINGE
INTRAMUSCULAR | Status: DC | PRN
  Administered 2021-03-31: 60 mg via INTRAVENOUS

## 2021-03-31 MED ORDER — HEPARIN (PORCINE) 25000 UT/250ML-% IV SOLN
INTRAVENOUS | Status: AC
  Filled 2021-03-31: qty 250

## 2021-03-31 MED ORDER — LACTATED RINGERS IV SOLN: INTRAVENOUS | Status: DC

## 2021-03-31 MED ORDER — HEPARIN SODIUM (PORCINE) 1000 UNIT/ML IJ SOLN
INTRAMUSCULAR | Status: DC | PRN
  Administered 2021-03-31: 3000 [IU] via INTRAVENOUS
  Administered 2021-03-31: 1000 [IU] via INTRAVENOUS

## 2021-03-31 MED ORDER — ONDANSETRON HCL 4 MG/2ML IJ SOLN
INTRAMUSCULAR | Status: DC | PRN
  Administered 2021-03-31: 4 mg via INTRAVENOUS

## 2021-03-31 MED ORDER — PHENYLEPHRINE 40 MCG/ML (10ML) SYRINGE FOR IV PUSH (FOR BLOOD PRESSURE SUPPORT)
PREFILLED_SYRINGE | INTRAVENOUS | Status: DC | PRN
  Administered 2021-03-31: 120 ug via INTRAVENOUS
  Administered 2021-03-31: 80 ug via INTRAVENOUS

## 2021-03-31 MED ORDER — EPTIFIBATIDE 20 MG/10ML IV SOLN
INTRAVENOUS | Status: DC | PRN
  Administered 2021-03-31: 1.5 mg via INTRA_ARTERIAL
  Administered 2021-03-31: 1.5 mg
  Administered 2021-03-31: 1.5 mg via INTRA_ARTERIAL

## 2021-03-31 MED ORDER — ACETAMINOPHEN 160 MG/5ML PO SOLN: 650.0000 mg | ORAL | Status: DC | PRN

## 2021-03-31 MED ORDER — TICAGRELOR 90 MG PO TABS
90.0000 mg | ORAL_TABLET | Freq: Two times a day (BID) | ORAL | Status: DC
  Administered 2021-03-31 – 2021-04-01 (×2): 90 mg via ORAL
  Filled 2021-03-31 (×2): qty 1

## 2021-03-31 MED ORDER — ASPIRIN 81 MG PO CHEW
81.0000 mg | CHEWABLE_TABLET | Freq: Every day | ORAL | Status: DC
  Administered 2021-03-31 – 2021-04-03 (×4): 81 mg via ORAL
  Filled 2021-03-31 (×4): qty 1

## 2021-03-31 NOTE — Progress Notes (Signed)
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 Salley Scarlet of dermatology--used Band-Aid to cover had  several accidental trips and fall lifting the left thumbnail-saw PCP was placed on p.o. antibiotic + injection but because of worsening erythema pain etc. brought to ED Dr. Apolonio Schneiders of hand surgery reviewed the images and does not feel antibiotics were needed  Hospital-Problem based course  Trauma to left Thumb Does not require any antibiotics, symptomatic management/outpatient follow-up with Hand, Dr. Apolonio Schneiders who gave recommendations to be admitting physician continue Norco 1-2 every 4 as needed severe pain Frequent falls Unsafe to discharge because of 2 falls recently and mild cognitive impairments Therapy services rec's SNF--patient will d/c to home at d/c on as per his wishes Strokes in the past Prior large MCA infarct with hemorrhagic conversion 2/28 Right ICA stenosis with complex atherosclerotic plaque status post stent assisted angioplasty per Dr. Estanislado Pandy 9/19 aspirin 81 mg, Keppra 500 twice daily  Blood pressure goals Heparin drip Brilinta dosing all as per neuro IR On NS 75 cc/H Continue atorvastatin 10 daily R ICA stenosis diagnostic angiogram  03/31/21 under Neuro-IR Dr. Estanislado Pandy Hypertension Continue amlodipine 10 nightly, clonidine patch 0.1 q. 7 days Patient started on Cleviprex infusion in addition to p.o. meds Diabetes mellitus type 2 Glipizide XL 10 mg held on admission, currently 5 units of long-acting insulin  CBG well controlled 1 40-1 80 Adjust diabetes meds in the morning  DVT prophylaxis: Lovenox Code Status: DNR Family Communication: Discussed with patient's wife at the bedside postprocedure Disposition:  Status is: Inpatient  Remains inpatient appropriate because:Hemodynamically unstable, Unsafe d/c plan, and IV treatments appropriate due to intensity of illness or inability to take PO  Dispo: The patient is from: Home              Anticipated d/c is to: Likely can discharge 9/24 remains stable and comes off all drips          Difficult to place patient No  Consultants:  Hand surgeon Dr. Apolonio Schneiders  Procedures:   Antimicrobials:     Subjective:  Awakens easily no distress seems comfortable no pain On oxygen Wife at the bedside   Objective: Vitals:   03/31/21 1300 03/31/21 1315 03/31/21 1326 03/31/21 1330  BP: (!) 97/48 118/62  (!) 94/49  Pulse: 70 66 69 70  Resp: 20 16 (!) 24 19  Temp:      TempSrc:      SpO2: 95% 98% 96% 96%  Weight:      Height:        Intake/Output Summary (Last 24 hours) at 03/31/2021 1404 Last data filed at 03/31/2021 1200 Gross per 24 hour  Intake 2922.95 ml  Output 1000 ml  Net 1922.95 ml    Filed Weights   03/26/21 1600 03/31/21 0744 03/31/21 1210  Weight: 83.5 kg 83.5 kg 84.8 kg  Examination:  coherent no distress on oxygen EOMI NCAT no focal deficit CTA B no added sound no rhonchi Abdomen soft-right groin seems to have sealed and does not seem to have any hematoma Pupils seem equally reactive Moving grossly lower extremities full neuro exam deferred   Data Reviewed: personally reviewed   CBC    Component Value Date/Time   WBC 7.2 03/31/2021  0136   RBC 3.58 (L) 03/31/2021 0136   HGB 9.8 (L) 03/31/2021 0136   HGB 10.2 (L) 12/12/2018 1431   HCT 30.4 (L) 03/31/2021 0136   PLT 158 03/31/2021 0136   PLT 160 12/12/2018 1431   MCV 84.9 03/31/2021 0136   MCH 27.4 03/31/2021 0136   MCHC 32.2 03/31/2021 0136   RDW 13.9 03/31/2021 0136   LYMPHSABS 1.8 03/31/2021 0136   MONOABS 1.0 03/31/2021 0136   EOSABS 0.5 03/31/2021 0136   BASOSABS 0.1 03/31/2021 0136   CMP Latest Ref Rng & Units 03/31/2021 03/30/2021 03/29/2021  Glucose 70 - 99 mg/dL 152(H) 165(H) 177(H)  BUN 8 - 23 mg/dL 31(H) 30(H) 26(H)  Creatinine 0.61 - 1.24 mg/dL 1.75(H) 1.72(H) 1.31(H)  Sodium 135 - 145 mmol/L 135 135 134(L)  Potassium 3.5 - 5.1 mmol/L 3.8 4.5 3.7  Chloride 98 - 111 mmol/L 99 98 97(L)  CO2 22 - 32 mmol/L 25 27 27   Calcium 8.9 - 10.3 mg/dL 8.5(L) 9.2 9.0  Total Protein 6.5 - 8.1 g/dL 5.2(L) - -  Total Bilirubin 0.3 - 1.2 mg/dL 0.5 - -  Alkaline Phos 38 - 126 U/L 77 - -  AST 15 - 41 U/L 15 - -  ALT 0 - 44 U/L 18 - -     Radiology Studies: No results found.   Scheduled Meds:  amLODipine  10 mg Oral QHS   aspirin  81 mg Oral Daily   Or   aspirin  81 mg Per Tube Daily   atorvastatin  10 mg Oral Daily   Chlorhexidine Gluconate Cloth  6 each Topical Daily   [START ON 04/01/2021] cloNIDine  0.1 mg Transdermal Weekly   darifenacin  15 mg Oral Daily   docusate sodium  100 mg Oral BID   FLUoxetine  20 mg Oral Daily   insulin aspart  0-15 Units Subcutaneous TID WC   insulin glargine-yfgn  5 Units Subcutaneous QHS   levETIRAcetam  500 mg Oral BID   nitroGLYCERIN       pantoprazole  40 mg Oral Daily   ticagrelor  45 mg Oral BID   ticagrelor  90 mg Oral BID   Or   ticagrelor  90 mg Per Tube BID   Continuous Infusions:  sodium chloride 75 mL/hr at 03/31/21 1242   clevidipine     clevidipine 2 mg/hr (03/31/21 1256)   heparin     heparin 500 Units/hr (03/31/21 1245)     LOS: 3 days   Time spent: [place], MD [facility] To contact the attending provider between 7A-7P or the covering provider during after hours 7P-7A, please log into the web site [email or web address] and access using universal [place] password for that web site. If you do not have the password, please call the hospital operator.  03/31/2021, 2:04 PM

## 2021-03-31 NOTE — Progress Notes (Signed)
ANTICOAGULATION CONSULT NOTE - Initial Consult  Pharmacy Consult for heparin Indication: s/p ICA IR procedure  Allergies  Allergen Reactions   Lisinopril Cough   Invokana [Canagliflozin] Other (See Comments)    Stopped by MD, unknown reaction    Penicillins Other (See Comments)    Allergic as a child.  Patient does not remember reaction.  Has had cephalasporins without problems.   Sulfamethoxazole Other (See Comments)    Unknown reaction- from childhood    Patient Measurements: Height: 5\' 9"  (175.3 cm) Weight: 84.8 kg (186 lb 15.2 oz) IBW/kg (Calculated) : 70.7 Heparin Dosing Weight: TBW  Vital Signs: Temp: 98.9 F (37.2 C) (09/19 1600) Temp Source: Axillary (09/19 1600) BP: 109/56 (09/19 1900) Pulse Rate: 70 (09/19 1900)  Labs: Recent Labs    03/29/21 0141 03/30/21 1645 03/31/21 0136 03/31/21 1848  HGB 11.2* 12.2* 9.8*  --   HCT 33.9* 37.8* 30.4*  --   PLT 166 199 158  --   LABPROT  --  12.5  --   --   INR  --  0.9  --   --   HEPARINUNFRC  --   --   --  0.24*  CREATININE 1.31* 1.72* 1.75*  --      Estimated Creatinine Clearance: 32.5 mL/min (A) (by C-G formula based on SCr of 1.75 mg/dL (H)).   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  He is not on anticoagulation PTA  9/19 heparin level 0.24 (on 500 units/hr)  Goal of Therapy:  Heparin level 0.1-0.25 units/ml Monitor platelets by anticoagulation protocol: Yes   Plan:   Continue heparin gtt at 500 units/hr F/u heparin level with am labs Heparin gtt to stop tomorrow AM at 0800 for sheath removal  Donnald Garre, PharmD Clinical Pharmacist  Please check AMION for all [place] numbers After 10:00 PM, call [facility]

## 2021-03-31 NOTE — Progress Notes (Signed)
Report given to [facility] from [place]. Patient leaving floor at 0723.

## 2021-03-31 NOTE — Progress Notes (Signed)
ANTICOAGULATION CONSULT NOTE - Initial Consult  Pharmacy Consult for heparin Indication: s/p ICA IR procedure  Allergies  Allergen Reactions   Lisinopril Cough   Invokana [Canagliflozin] Other (See Comments)    Stopped by MD, unknown reaction    Penicillins Other (See Comments)    Allergic as a child.  Patient does not remember reaction.  Has had cephalasporins without problems.   Sulfamethoxazole Other (See Comments)    Unknown reaction- from childhood    Patient Measurements: Height: '5\' 9"'$  (175.3 cm) Weight: 84.8 kg (186 lb 15.2 oz) IBW/kg (Calculated) : 70.7 Heparin Dosing Weight: TBW  Vital Signs: Temp: 97.5 F (36.4 C) (09/19 1210) Temp Source: Oral (09/19 1210) BP: 127/66 (09/19 1215) Pulse Rate: 70 (09/19 1215)  Labs: Recent Labs    03/29/21 0141 03/30/21 1645 03/31/21 0136  HGB 11.2* 12.2* 9.8*  HCT 33.9* 37.8* 30.4*  PLT 166 199 158  LABPROT  --  12.5  --   INR  --  0.9  --   CREATININE 1.31* 1.72* 1.75*    Estimated Creatinine Clearance: 32.5 mL/min (A) (by C-G formula based on SCr of 1.75 mg/dL (H)).   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  He is not on anticoagulation PTA  Goal of Therapy:  Heparin level 0.1-0.25 units/ml Monitor platelets by anticoagulation protocol: Yes   Plan:  Continue heparin gtt at 500 units/hr F/u 8h heparin level Heparin gtt to stop tomorrow AM at 0800 for sheath  removal   Bertis Ruddy, PharmD Clinical Pharmacist ED Pharmacist Phone # [phone] 03/31/2021 12:30 PM

## 2021-03-31 NOTE — Sedation Documentation (Signed)
Nurse and CRNA transported patient to PACU  5. Bedside report given to [facility] RN right groin dressing intact no hematoma.

## 2021-03-31 NOTE — Anesthesia Postprocedure Evaluation (Signed)
Anesthesia Post Note  Patient: Frank [name]  Procedure(s) Performed: IR WITH ANESTHESIA Comfort     Patient location during evaluation: PACU Anesthesia Type: General Level of consciousness: awake and alert Pain management: pain level controlled Vital Signs Assessment: post-procedure vital signs reviewed and stable Respiratory status: spontaneous breathing, nonlabored ventilation, respiratory function stable and patient connected to nasal cannula oxygen Cardiovascular status: blood pressure returned to baseline and stable Postop Assessment: no apparent nausea or vomiting Anesthetic complications: no   No notable events documented.  Last Vitals:  Vitals:   03/31/21 1400 03/31/21 1430  BP: (!) 117/54 (!) 102/58  Pulse: 73 75  Resp: 15 18  Temp:    SpO2: 96% 98%    Last Pain:  Vitals:   03/31/21 1210  TempSrc: Oral  PainSc: 0-No pain                 Tiajuana Amass

## 2021-03-31 NOTE — Procedures (Signed)
S/P RT VA and bilateral common carotid artreriograms followed by revascularization of prox Rt ICA stenosis associated with a complex atherosclerotic plaque with Stent assisted angioplasty and distal protection. Post CT brain No ICH Stable bilateral chronic subdurals. Extubated. Pupils 2 to 3 mm RT = Lt sluggish. No gross facial asymmetry. Tongue midline. Gradually moving all 4 s to command. 59F angioseal for RT groin hemostasis. Distal,pulses all intact. S.Laiklynn Raczynski MD

## 2021-03-31 NOTE — Sedation Documentation (Signed)
ACT [id number] notified Doctor [employer]

## 2021-03-31 NOTE — Anesthesia Preprocedure Evaluation (Signed)
Anesthesia Evaluation  Patient identified by MRN, date of birth, ID band Patient awake    Reviewed: Allergy & Precautions, NPO status , Patient's Chart, lab work & pertinent test results, Unable to perform ROS - Chart review only  Airway Mallampati: II  TM Distance: >3 FB     Dental  (+) Dental Advisory Given   Pulmonary sleep apnea , former smoker,    Pulmonary exam normal        Cardiovascular hypertension, Pt. on medications Normal cardiovascular exam+ dysrhythmias + pacemaker  Rhythm:Regular Rate:Normal     Neuro/Psych CVA    GI/Hepatic Neg liver ROS, GERD  ,  Endo/Other  diabetes, Type 2, Insulin Dependent, Oral Hypoglycemic Agents  Renal/GU CRFRenal disease     Musculoskeletal  (+) Arthritis ,   Abdominal   Peds  Hematology  (+) anemia ,   Anesthesia Other Findings   Reproductive/Obstetrics                             Lab Results  Component Value Date   WBC 7.2 03/31/2021   HGB 9.8 (L) 03/31/2021   HCT 30.4 (L) 03/31/2021   MCV 84.9 03/31/2021   PLT 158 03/31/2021   Lab Results  Component Value Date   CREATININE 1.75 (H) 03/31/2021   BUN 31 (H) 03/31/2021   NA 135 03/31/2021   K 3.8 03/31/2021   CL 99 03/31/2021   CO2 25 03/31/2021    Anesthesia Physical  Anesthesia Plan  ASA: 3 and emergent  Anesthesia Plan: General   Post-op Pain Management:    Induction: Intravenous  PONV Risk Score and Plan: 2 and Ondansetron, Dexamethasone and Treatment may vary due to age or medical condition  Airway Management Planned: Oral ETT and Video Laryngoscope Planned  Additional Equipment:   Intra-op Plan:   Post-operative Plan: Extubation in OR  Informed Consent: I have reviewed the patients History and Physical, chart, labs and discussed the procedure including the risks, benefits and alternatives for the proposed anesthesia with the patient or authorized representative  who has indicated his/her understanding and acceptance.   Patient has DNR.  Discussed DNR with power of attorney and Suspend DNR.   Dental advisory given and Consent reviewed with POA  Plan Discussed with: CRNA  Anesthesia Plan Comments:         Anesthesia Quick Evaluation

## 2021-03-31 NOTE — Anesthesia Procedure Notes (Signed)
Procedure Name: Intubation Date/Time: 03/31/2021 9:02 AM Performed by: Myna Bright, CRNA Pre-anesthesia Checklist: Patient identified, Emergency Drugs available, Suction available and Patient being monitored Patient Re-evaluated:Patient Re-evaluated prior to induction Oxygen Delivery Method: Circle system utilized Preoxygenation: Pre-oxygenation with 100% oxygen Induction Type: IV induction Ventilation: Mask ventilation without difficulty Laryngoscope Size: Mac and 4 Grade View: Grade II Tube type: Oral Tube size: 7.5 mm Number of attempts: 1 Airway Equipment and Method: Stylet Placement Confirmation: ETT inserted through vocal cords under direct vision, positive ETCO2 and breath sounds checked- equal and bilateral Secured at: 22 cm Tube secured with: Tape Dental Injury: Teeth and Oropharynx as per pre-operative assessment

## 2021-03-31 NOTE — Sedation Documentation (Signed)
ACT [id number] notified Doctor [employer]

## 2021-03-31 NOTE — Progress Notes (Signed)
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   Subjective:  Pt is doing well after procedure He is alert; conversing Wife at bedside --Dr Estanislado Pandy has seen and examined pt   Allergies: Lisinopril, Invokana [canagliflozin], Penicillins, and Sulfamethoxazole  Medications: Prior to Admission medications   Medication Sig Start Date End Date Taking? Authorizing Provider  acetaminophen (TYLENOL) 500 MG tablet Take 500-1,000 mg by mouth every 6 (six) hours as needed for moderate pain or headache.   Yes [provider]  amoxicillin-clavulanate (AUGMENTIN) 875-125 MG tablet Take 1 tablet by mouth 2 (two) times daily. 03/25/21  Yes Biagio Borg, MD  aspirin 81 MG chewable tablet Chew 1 tablet (81 mg total) by mouth daily. 09/18/20  Yes Pashayan, Redgie Grayer, MD  atorvastatin (LIPITOR) 10 MG tablet Take 1 tablet (10 mg total) by mouth daily. 09/18/20  Yes Pashayan, Redgie Grayer, MD  cloNIDine (CATAPRES - DOSED IN MG/24 HR) 0.1 mg/24hr patch Place 1 patch (0.1 mg total) onto the skin once a week. 11/13/20  Yes Janith Lima, MD  Ferric Maltol (ACCRUFER) 30 MG CAPS Take 1 capsule by mouth in the morning and at bedtime. Patient taking differently: Take 30 mg by mouth in the morning and at bedtime. 11/06/20  Yes Janith Lima, MD  FLUoxetine (PROZAC) 20 MG capsule TAKE 1 CAPSULE BY MOUTH  DAILY Patient taking differently: Take 20 mg by mouth daily. 03/13/21  Yes Janith Lima, MD  fluticasone (FLONASE) 50 MCG/ACT nasal spray Place 1-2 sprays into both nostrils daily as needed for allergies or rhinitis.   Yes [provider]  insulin glargine, 2 Unit Dial, (TOUJEO MAX SOLOSTAR) 300 UNIT/ML Solostar Pen Inject 10 Units into the skin daily. 01/09/21  Yes Janith Lima, MD  levETIRAcetam (KEPPRA) 500 MG tablet Take 1 tablet (500 mg total) by mouth 2 (two) times daily. 01/19/21  Yes Janith Lima, MD  pantoprazole (PROTONIX) 40 MG tablet TAKE 1 TABLET BY MOUTH  DAILY Patient taking differently: Take 40 mg by mouth daily. 02/19/21  Yes Janith Lima, MD  solifenacin (VESICARE) 10 MG tablet TAKE 1 TABLET BY MOUTH  DAILY Patient taking differently: Take 10 mg by mouth daily. 03/16/21  Yes Janith Lima, MD  ticagrelor (BRILINTA) 90 MG TABS tablet Take 0.5 tablets (45 mg total) by mouth 2 (two) times daily. 11/14/20  Yes Janith Lima, MD  amLODipine (NORVASC) 10 MG tablet Take 1 tablet (10 mg total) by mouth daily. Patient taking differently: Take 10 mg by mouth at bedtime. 09/18/20   Briant Cedar, MD  glipiZIDE (GLUCOTROL XL) 10 MG 24 hr tablet TAKE 1 TABLET BY MOUTH  DAILY 03/28/21   Janith Lima, MD  Insulin Pen Needle 32G X 6 MM MISC 1 Act by Does not apply route daily. 04/08/20   Janith Lima, MD  PRESCRIPTION MEDICATION CPAP- At bedtime    [provider]     Vital Signs: BP (!) 102/58   Pulse 75   Temp (!) 97.5 F (36.4 C) (Oral)   Resp 18   Ht 5\' 9"  (1.753 m)   Wt 186 lb 15.2 oz (84.8 kg)  SpO2 98%   BMI 27.61 kg/m   Physical Exam Vitals reviewed.  HENT:     Mouth/Throat:     Mouth: Mucous membranes are moist.  Eyes:     Extraocular Movements: Extraocular movements intact.  Abdominal:     Palpations: Abdomen is soft.  Musculoskeletal:     Comments: Moves all 4s Follows commands Slight left arm drift Palm slow to supinate  Rt groin is clean and dry NT- no hematoma Rt foot with good pulses    Skin:    General: Skin is warm.  Neurological:     Comments: Alert Pleasant     Imaging: No results found.  Labs:  CBC: Recent Labs    03/27/21 0327  03/29/21 0141 03/30/21 1645 03/31/21 0136  WBC 8.6 6.5 8.1 7.2  HGB 11.6* 11.2* 12.2* 9.8*  HCT 36.5* 33.9* 37.8* 30.4*  PLT 162 166 199 158    COAGS: Recent Labs    09/09/20 1101 11/18/20 0945 03/30/21 1645  INR 1.0 1.2 0.9  APTT 32 29  --     BMP: Recent Labs    03/27/21 0327 03/29/21 0141 03/30/21 1645 03/31/21 0136  NA 137 134* 135 135  K 3.9 3.7 4.5 3.8  CL 103 97* 98 99  CO2 27 27 27 25   GLUCOSE 95 177* 165* 152*  BUN 27* 26* 30* 31*  CALCIUM 8.9 9.0 9.2 8.5*  CREATININE 1.37* 1.31* 1.72* 1.75*  GFRNONAA 52* 54* 39* 38*    LIVER FUNCTION TESTS: Recent Labs    11/18/20 0902 12/26/20 1600 03/26/21 0915 03/31/21 0136  BILITOT 0.8 0.6 0.5 0.5  AST 18 16 24 15   ALT 15 15 29 18   ALKPHOS 111 92 77 77  PROT 6.7 6.6 6.2* 5.2*  ALBUMIN 3.3* 3.6 3.0* 2.4*    Assessment and Plan:  CVA R ICA revascularization in IR with Dr Estanislado Pandy today Plans per Dr Verlon Au IR will see pt back in 2 weeks for recheck- he will hear from scheduler  Electronically Signed: Lavonia Drafts, PA-C 03/31/2021, 3:06 PM   I spent a total of 15 Minutes at the the patient's bedside AND on the patient's hospital floor or unit, greater than 50% of which was counseling/coordinating care for R ICA revascularization

## 2021-03-31 NOTE — Transfer of Care (Signed)
Immediate Anesthesia Transfer of Care Note  Patient: Frank [name]  Procedure(s) Performed: IR WITH ANESTHESIA STENT PLACEMENT  Patient Location: PACU  Anesthesia Type:General  Level of Consciousness: sedated, patient cooperative and responds to stimulation  Airway & Oxygen Therapy: Patient Spontanous Breathing and Patient connected to face mask oxygen  Post-op Assessment: Report given to RN, Post -op Vital signs reviewed and stable, Patient moving all extremities X 4 and Patient able to stick tongue midline  Post vital signs: Reviewed and stable  Last Vitals:  Vitals Value Taken Time  BP 124/63 03/31/21 1123  Temp    Pulse 66 03/31/21 1131  Resp 20 03/31/21 1131  SpO2 100 % 03/31/21 1131  Vitals shown include unvalidated device data.  Last Pain:  Vitals:   03/31/21 0744  TempSrc: Oral  PainSc:          Complications: No notable events documented.

## 2021-03-31 NOTE — Anesthesia Procedure Notes (Signed)
Arterial Line Insertion Start/End9/19/2022 8:10 AM, 03/31/2021 8:15 AM Performed by: Myna Bright, CRNA, CRNA  Preanesthetic checklist: patient identified, IV checked, site marked, risks and benefits discussed, surgical consent, monitors and equipment checked, pre-op evaluation and timeout performed Lidocaine 1% used for infiltration Left, radial was placed Catheter size: 20 G Maximum sterile barriers used   Attempts: 1 Procedure performed without using ultrasound guided technique. Following insertion, Biopatch and dressing applied. Post procedure assessment: normal  Patient tolerated the procedure well with no immediate complications.

## 2021-04-01 ENCOUNTER — Encounter (HOSPITAL_COMMUNITY): Payer: Self-pay | Admitting: Interventional Radiology

## 2021-04-01 DIAGNOSIS — L089 Local infection of the skin and subcutaneous tissue, unspecified: Secondary | ICD-10-CM | POA: Diagnosis not present

## 2021-04-01 LAB — CBC WITH DIFFERENTIAL/PLATELET
Abs Immature Granulocytes: 0.04 10*3/uL (ref 0.00–0.07)
Basophils Absolute: 0 10*3/uL (ref 0.0–0.1)
Basophils Relative: 0 %
Eosinophils Absolute: 0.1 10*3/uL (ref 0.0–0.5)
Eosinophils Relative: 1 %
HCT: 27.4 % — ABNORMAL LOW (ref 39.0–52.0)
Hemoglobin: 8.8 g/dL — ABNORMAL LOW (ref 13.0–17.0)
Immature Granulocytes: 1 %
Lymphocytes Relative: 13 %
Lymphs Abs: 1 10*3/uL (ref 0.7–4.0)
MCH: 27.4 pg (ref 26.0–34.0)
MCHC: 32.1 g/dL (ref 30.0–36.0)
MCV: 85.4 fL (ref 80.0–100.0)
Monocytes Absolute: 0.8 10*3/uL (ref 0.1–1.0)
Monocytes Relative: 10 %
Neutro Abs: 5.6 10*3/uL (ref 1.7–7.7)
Neutrophils Relative %: 75 %
Platelets: 165 10*3/uL (ref 150–400)
RBC: 3.21 MIL/uL — ABNORMAL LOW (ref 4.22–5.81)
RDW: 13.7 % (ref 11.5–15.5)
WBC: 7.4 10*3/uL (ref 4.0–10.5)
nRBC: 0 % (ref 0.0–0.2)

## 2021-04-01 LAB — PLATELET INHIBITION P2Y12

## 2021-04-01 LAB — GLUCOSE, CAPILLARY
Glucose-Capillary: 118 mg/dL — ABNORMAL HIGH (ref 70–99)
Glucose-Capillary: 101 mg/dL — ABNORMAL HIGH (ref 70–99)

## 2021-04-01 LAB — BASIC METABOLIC PANEL
Anion gap: 9 (ref 5–15)
BUN: 22 mg/dL (ref 8–23)
CO2: 25 mmol/L (ref 22–32)
Calcium: 8.5 mg/dL — ABNORMAL LOW (ref 8.9–10.3)
Chloride: 101 mmol/L (ref 98–111)
Creatinine, Ser: 1.21 mg/dL (ref 0.61–1.24)
GFR, Estimated: 60 mL/min — ABNORMAL LOW (ref >60)
Glucose, Bld: 109 mg/dL — ABNORMAL HIGH (ref 70–99)
Potassium: 3.9 mmol/L (ref 3.5–5.1)
Sodium: 135 mmol/L (ref 135–145)

## 2021-04-01 LAB — SARS CORONAVIRUS 2 (TAT 6-24 HRS): SARS Coronavirus 2: NEGATIVE

## 2021-04-01 MED ORDER — TICAGRELOR 90 MG PO TABS
90.0000 mg | ORAL_TABLET | Freq: Once | ORAL | Status: AC
  Administered 2021-04-01: 90 mg via ORAL
  Filled 2021-04-01: qty 1

## 2021-04-01 MED ORDER — SODIUM CHLORIDE 0.9 % IV BOLUS: 250.0000 mL | INTRAVENOUS | Status: DC | PRN

## 2021-04-01 MED ORDER — TICAGRELOR 90 MG PO TABS
45.0000 mg | ORAL_TABLET | Freq: Two times a day (BID) | ORAL | Status: DC
  Administered 2021-04-02 – 2021-04-03 (×3): 45 mg via ORAL
  Filled 2021-04-01 (×3): qty 1

## 2021-04-01 MED ORDER — TICAGRELOR 90 MG PO TABS: 45.0000 mg | ORAL_TABLET | Freq: Two times a day (BID) | ORAL | Status: DC

## 2021-04-01 NOTE — Progress Notes (Signed)
Inpatient Rehab Admissions Coordinator:   Per updated therapy recommendations pt was screened by Shann Medal, PT, DPT.  Discussed with Dr. Naaman Plummer as it does not appear pt meets criteria for medical necessity and insurance unlikely to approve CIR.  No consult at this time.    Shann Medal, PT, DPT Admissions Coordinator [phone] 04/01/21  4:36 PM

## 2021-04-01 NOTE — TOC Progression Note (Signed)
Transition of Care [facility]) - Progression Note    Patient Details  Name: Markey Deady MRN: [id number] Date of Birth: 27-Dec-1940  Transition of Care [facility]) CM/SW [place], LCSW Phone Number: 04/01/2021, 5:00 PM  Clinical Narrative:    CSW spoke with patient's spouse and provided SNF bed offers as CIR cannot accept patient. She has selected [facility]. CSW submitted clinicals and initiated insurance authorization process.  Ref# [id number].   Expected Discharge Plan: [place] Barriers to Discharge: Continued Medical Work up, SNF Pending bed offer  Expected Discharge Plan and Services Expected Discharge Plan: [place] Choice: [place] (only if [facility] or [place]) Living arrangements for the past 2 months: Single Family Home                                       Social Determinants of Health (SDOH) Interventions    Readmission Risk Interventions No flowsheet data found.

## 2021-04-01 NOTE — TOC Progression Note (Addendum)
Transition of Care [facility]) - Progression Note    Patient Details  Name: Frank [name] MRN: [id number] Date of Birth: 12/20/1940  Transition of Care [facility]) CM/SW [place], LCSW Phone Number: 04/01/2021, 11:05 AM  Clinical Narrative:    11am-CSW awaiting responses from [facility] and [facility].   12pm-[facility] does not have any availability.    Expected Discharge Plan: [place] Barriers to Discharge: Continued Medical Work up, SNF Pending bed offer  Expected Discharge Plan and Services Expected Discharge Plan: [place] Choice: [place] (only if [facility] or [place]) Living arrangements for the past 2 months: Single Family Home                                       Social Determinants of Health (SDOH) Interventions    Readmission Risk Interventions No flowsheet data found.

## 2021-04-01 NOTE — Evaluation (Signed)
Physical Therapy Re-Evaluation Patient Details Name: Frank [name] MRN: [id number] DOB: [birth year] Today's Date: 04/01/2021  History of Present Illness  Pt admitted on 9/14 with L thumb infection and h/o recent falls (recent lesion removed from L thumb complicated by falls and thought may need antibiotics).  Pt also with planned revasculazation scheduled and had RT VA and bil common carotid arteriograms followed by revascularization of prox R ICA stenosis with stent assisted angioplasty performed on 03/31/21. 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  Clinical Impression    Pt reevaluated by PT s/p revascularization of R ICA on 03/31/21.  He required min-mod A of 2 for safety with transfers and was able to ambulate 12'.  Required cues for safety and sequencing.  Pt with orthostatic hypotension but was mostly asymptomatic (reported light dizziness in standing).  With new medical issues - updated recommendation to CIR.        Recommendations for follow up therapy are one component of a multi-disciplinary discharge planning process, led by the attending physician.  Recommendations may be updated based on patient status, additional functional criteria and insurance authorization.  Follow Up Recommendations CIR    Equipment Recommendations  None recommended by PT    Recommendations for Other Services Rehab consult     Precautions / Restrictions Precautions Precautions: Fall Restrictions Weight Bearing Restrictions: No      Mobility  Bed Mobility Overal bed mobility: Needs Assistance Bed Mobility: Supine to Sit     Supine to sit: Mod assist     General bed mobility comments: Cues for sliding legs to EOB then mod A to lift trunk and scoot forward using pad    Transfers Overall transfer level: Needs  assistance Equipment used: Rolling walker (2 wheeled) Transfers: Sit to/from Stand Sit to Stand: Min assist;+2 physical assistance         General transfer comment: Min A x 2 to stand with RW stabilized and increased time to rise; cues for hand placement  Ambulation/Gait Ambulation/Gait assistance: Min assist Gait Distance (Feet): 15 Feet Assistive device: Rolling walker (2 wheeled) Gait Pattern/deviations: Step-to pattern;Decreased stride length;Trunk flexed Gait velocity: reduced   General Gait Details: Cues for RW proximity and posture, and to stay on task/focus on walking; tendency to posterior lean with min A for balance and RW; had chair follow  Stairs            Wheelchair Mobility    Modified Rankin (Stroke Patients Only)       Balance Overall balance assessment: Needs assistance Sitting-balance support: Feet supported;No upper extremity supported Sitting balance-Leahy Scale: Fair   Postural control: Posterior lean Standing balance support: Bilateral upper extremity supported;During functional activity Standing balance-Leahy Scale: Poor Standing balance comment: Requiring min A and RW: initially leaning posteriorly and bracing on bed able to correct with cues to increase weight on toes                             Pertinent Vitals/Pain Pain Assessment: No/denies pain    Home Living Family/patient expects to be discharged to:: Private residence Living Arrangements: Spouse/significant other (wife ina) Available Help at Discharge: Available 24 hours/day;Family;Personal care attendant (5 days week personal care attendant except 4-11; on weekend only gets 1 shift PCA) Type of Home: House Home Access: Stairs to enter Entrance Stairs-Rails:  Can reach both Entrance Stairs-Number of Steps: 4 Home Layout: One level Home Equipment: [place] - 2 wheels;Shower seat;Grab bars - tub/shower;Grab bars - toilet;Cane - single point;Walker - 4 wheels;Wheelchair -  manual      Prior Function Level of Independence: Independent with assistive device(s);Needs assistance   Gait / Transfers Assistance Needed: Uses walker down steps, rollator to get to car, in house uses w/c b/c hx of falls  ADL's / Homemaking Assistance Needed: transfers to w/c to access bathroom per pt report        Hand Dominance   Dominant Hand: Right    Extremity/Trunk Assessment                Communication   Communication: No difficulties  Cognition Arousal/Alertness: Awake/alert Behavior During Therapy: WFL for tasks assessed/performed;Flat affect Overall Cognitive Status: History of cognitive impairments - at baseline                                        General Comments General comments (skin integrity, edema, etc.): Pt's monitor alarming vtach at times with max HR 122 bpm - RN aware and felt due to pacemaker.  BP was 113/53 supine, 93/57 walk (reports mild dizziness), returned to chair was 76/36, reclined and placed SCDs 91/43.  RN present and aware.  Pt asymptomatic of hypotension.    Exercises     Assessment/Plan    PT Assessment Patient needs continued PT services  PT Problem List Decreased strength;Decreased activity tolerance;Decreased balance;Decreased mobility;Decreased coordination;Decreased knowledge of use of DME;Decreased safety awareness;Cardiopulmonary status limiting activity       PT Treatment Interventions DME instruction;Gait training;Functional mobility training;Therapeutic activities;Therapeutic exercise;Balance training;Neuromuscular re-education;Patient/family education;Stair training;Wheelchair mobility training    PT Goals (Current goals can be found in the Care Plan section)  Acute Rehab PT Goals Patient Stated Goal: go to rehab and get better PT Goal Formulation: With patient/family Time For Goal Achievement: 04/15/21 Potential to Achieve Goals: Good    Frequency Min 3X/week   Barriers to discharge  Decreased caregiver support;Inaccessible home environment      Co-evaluation PT/OT/SLP Co-Evaluation/Treatment: Yes Reason for Co-Treatment: Complexity of the patient's impairments (multi-system involvement) PT goals addressed during session: Mobility/safety with mobility;Balance         AM-PAC PT "6 Clicks" Mobility  Outcome Measure Help needed turning from your back to your side while in a flat bed without using bedrails?: A Lot Help needed moving from lying on your back to sitting on the side of a flat bed without using bedrails?: A Lot Help needed moving to and from a bed to a chair (including a wheelchair)?: A Lot Help needed standing up from a chair using your arms (e.g., wheelchair or bedside chair)?: A Lot Help needed to walk in hospital room?: A Little Help needed climbing 3-5 steps with a railing? : A Lot 6 Click Score: 13    End of Session Equipment Utilized During Treatment: Gait belt Activity Tolerance: Patient tolerated treatment well Patient left: in chair;with call bell/phone within reach;with chair alarm set;with family/visitor present Nurse Communication: Mobility status PT Visit Diagnosis: Unsteadiness on feet (R26.81);Muscle weakness (generalized) (M62.81);History of falling (Z91.81)    Time: 0962-8366 PT Time Calculation (min) (ACUTE ONLY): 35 min   Charges:   PT Evaluation $PT Re-evaluation: 1 Re-eval          Abran Richard, PT Acute Rehab Services Pager [phone] [facility] [phone]   Karlton Lemon 04/01/2021, 11:51 AM

## 2021-04-01 NOTE — Progress Notes (Addendum)
Patient with orthostatic hypotension during PT. Patient's BP currently has not fully recovered since physical therapy, lowest noted SBP 77, patient completely asymptomatic, sitting up in bedside chair. BP repeated manually shortly after with BP 95/40 HR 63. Current SBP goal post ICA stent 120-140. BP currently 111/49 MAP 69 HR 74, sitting in chair, eating lunch now, asymptomatic. IR NP Roselyn Reef made aware. Will pass on to Dr. Estanislado Pandy, can change goals to SBP <140. Dr. Verlon Au updated as well.

## 2021-04-01 NOTE — Progress Notes (Signed)
Patient received Brilinta 90mg  last night. Per Dr. Estanislado Pandy, patient to receive another 90mg  this morning, then transition to 45mg  BID. Orders modified.

## 2021-04-01 NOTE — Progress Notes (Signed)
Referring Physician(s): Dr. Verlon Au  Supervising Physician: Luanne Bras  Patient Status:  [facility] - In-pt  Chief Complaint: Large MCA infarct with hemorrhagic conversion s/p right VA and bilateral common carotid arteriograms followed by revascularization of proximal right ICA stenosis associated with a complex atherosclerotic plaque with stent-assisted angioplasty and distal protection, done 03/31/21 by Dr. Estanislado Pandy   Subjective: Patient is alert and oriented; he denies any pain or discomfort. He states he is hungry and wants to watch TV.   Allergies: Lisinopril, Invokana [canagliflozin], Penicillins, and Sulfamethoxazole  Medications: Prior to Admission medications   Medication Sig Start Date End Date Taking? Authorizing Provider  acetaminophen (TYLENOL) 500 MG tablet Take 500-1,000 mg by mouth every 6 (six) hours as needed for moderate pain or headache.   Yes [provider]  amoxicillin-clavulanate (AUGMENTIN) 875-125 MG tablet Take 1 tablet by mouth 2 (two) times daily. 03/25/21  Yes Biagio Borg, MD  aspirin 81 MG chewable tablet Chew 1 tablet (81 mg total) by mouth daily. 09/18/20  Yes Pashayan, Redgie Grayer, MD  atorvastatin (LIPITOR) 10 MG tablet Take 1 tablet (10 mg total) by mouth daily. 09/18/20  Yes Pashayan, Redgie Grayer, MD  Ferric Maltol (ACCRUFER) 30 MG CAPS Take 1 capsule by mouth in the morning and at bedtime. Patient taking differently: Take 30 mg by mouth in the morning and at bedtime. 11/06/20  Yes Janith Lima, MD  FLUoxetine (PROZAC) 20 MG capsule TAKE 1 CAPSULE BY MOUTH  DAILY Patient taking differently: Take 20 mg by mouth daily. 03/13/21  Yes Janith Lima, MD  fluticasone (FLONASE) 50 MCG/ACT nasal spray Place 1-2 sprays into both nostrils daily as needed for allergies or rhinitis.   Yes [provider]  insulin glargine, 2 Unit Dial, (TOUJEO MAX SOLOSTAR) 300 UNIT/ML Solostar Pen Inject 10 Units into the skin daily. 01/09/21  Yes Janith Lima, MD  levETIRAcetam (KEPPRA) 500 MG tablet Take 1 tablet (500 mg total) by mouth 2 (two) times daily. 01/19/21  Yes Janith Lima, MD  pantoprazole (PROTONIX) 40 MG tablet TAKE 1 TABLET BY MOUTH  DAILY Patient taking differently: Take 40 mg by mouth daily. 02/19/21  Yes Janith Lima, MD  solifenacin (VESICARE) 10 MG tablet TAKE 1 TABLET BY MOUTH  DAILY Patient taking differently: Take 10 mg by mouth daily. 03/16/21  Yes Janith Lima, MD  ticagrelor (BRILINTA) 90 MG TABS tablet Take 0.5 tablets (45 mg total) by mouth 2 (two) times daily. 11/14/20  Yes Janith Lima, MD  amLODipine (NORVASC) 10 MG tablet Take 1 tablet (10 mg total) by mouth daily. Patient taking differently: Take 10 mg by mouth at bedtime. 09/18/20   Briant Cedar, MD  glipiZIDE (GLUCOTROL XL) 10 MG 24 hr tablet TAKE 1 TABLET BY MOUTH  DAILY 03/28/21   Janith Lima, MD  Insulin Pen Needle 32G X 6 MM MISC 1 Act by Does not apply route daily. 04/08/20   Janith Lima, MD  PRESCRIPTION MEDICATION CPAP- At bedtime    [provider]     Vital Signs: BP 114/61   Pulse 64   Temp 98.7 F (37.1 C) (Oral)   Resp 12   Ht 5\' 9"  (1.753 m)   Wt 186 lb 15.2 oz (84.8 kg)   SpO2 93%   BMI 27.61 kg/m   Physical Exam Constitutional:      General: He is not in acute distress.    Appearance: He is not ill-appearing.  HENT:     Mouth/Throat:     Mouth: Mucous membranes are dry.  Eyes:     General: No visual field deficit. Cardiovascular:     Comments: Right groin vascular site is soft, clean and dry. Minimally tender. No evidence of hematoma.  Pulmonary:     Effort: Pulmonary effort is normal.  Neurological:     Mental Status: He is alert and oriented to person, place, and time.     Cranial Nerves: No cranial nerve deficit, dysarthria or facial asymmetry.     Motor: Weakness present.     Coordination: Finger-Nose-Finger Test normal.     Comments: Left > right side weakness     Imaging: No  results found.  Labs:  CBC: Recent Labs    03/29/21 0141 03/30/21 1645 03/31/21 0136 04/01/21 0445  WBC 6.5 8.1 7.2 7.4  HGB 11.2* 12.2* 9.8* 8.8*  HCT 33.9* 37.8* 30.4* 27.4*  PLT 166 199 158 165    COAGS: Recent Labs    09/09/20 1101 11/18/20 0945 03/30/21 1645  INR 1.0 1.2 0.9  APTT 32 29  --     BMP: Recent Labs    03/29/21 0141 03/30/21 1645 03/31/21 0136 04/01/21 0445  NA 134* 135 135 135  K 3.7 4.5 3.8 3.9  CL 97* 98 99 101  CO2 27 27 25 25   GLUCOSE 177* 165* 152* 109*  BUN 26* 30* 31* 22  CALCIUM 9.0 9.2 8.5* 8.5*  CREATININE 1.31* 1.72* 1.75* 1.21  GFRNONAA 54* 39* 38* 60*    LIVER FUNCTION TESTS: Recent Labs    11/18/20 0902 12/26/20 1600 03/26/21 0915 03/31/21 0136  BILITOT 0.8 0.6 0.5 0.5  AST 18 16 24 15   ALT 15 15 29 18   ALKPHOS 111 92 77 77  PROT 6.7 6.6 6.2* 5.2*  ALBUMIN 3.3* 3.6 3.0* 2.4*    Assessment and Plan:  Large MCA infarct with hemorrhagic conversion s/p right VA and bilateral common carotid arteriograms followed by revascularization of proximal right ICA stenosis associated with a complex atherosclerotic plaque with stent-assisted angioplasty and distal protection, done 03/31/21 by Dr. Estanislado Pandy   Patient doing well this morning, right femoral vascular site is stable. [place] for patient to sit up at 30 degrees; ok to work with PT/OT today. Repeat P2Y12 ordered and we will await those results before adjusting Brilinta dose - currently scheduled to receive 90 mg twice daily. Patient will need outpatient follow up two weeks after discharge.   Other plans per primary teams. Please call NIR with any questions.   Electronically Signed: Soyla Dryer, AGACNP-BC [phone] 04/01/2021, 9:32 AM   I spent a total of 15 Minutes at the the patient's bedside AND on the patient's hospital floor or unit, greater than 50% of which was counseling/coordinating care for right ICA revascularization.

## 2021-04-01 NOTE — Progress Notes (Addendum)
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 Salley Scarlet of dermatology--used Band-Aid to cover had  several accidental trips and fall lifting the left thumbnail-saw PCP was placed on p.o. antibiotic + injection but because of worsening erythema pain etc. brought to ED Dr. Apolonio Schneiders of hand surgery reviewed the images and does not feel antibiotics were needed  Hospital-Problem based course  Trauma to left Thumb Does not require any antibiotics, symptomatic management/outpatient follow-up with Hand, Dr. Apolonio Schneiders who gave recommendations to be admitting physician continue Norco 1-2 every 4 as needed severe pain Frequent falls Unsafe to discharge because of 2 falls recently and mild cognitive impairments Therapy services rec's CIR-consult input pending patient previously will only go to 2 specific SNF--defer to case management next steps Strokes in the past Prior large MCA infarct with hemorrhagic conversion 2/28 Right ICA stenosis with complex atherosclerotic plaque status post stent assisted angioplasty per Dr. Estanislado Pandy 9/19 aspirin 81 mg, Keppra 500 twice daily  Blood pressure goals liberalized by neuro IR Dr Estanislado Pandy to below 140 however some hypotension  earlier today- we will continue on NS 75 cc/H and if blood pressures recovered from the 1 20-1 30 range probably can clear for discharge Continue atorvastatin 10 daily R ICA stenosis diagnostic angiogram 03/31/21 under Neuro-IR Dr. Estanislado Pandy Brilinta dosing postprocedure as per them Hypertension Continue amlodipine 10 nightly, clonidine is not a home med and was discontinued Stopped Cleviprex infusion morning of 9/20 Diabetes mellitus type 2 Glipizide XL 10 mg held on admission, currently 5 units of long-acting insulin  CBG well controlled 100-1 20 OSA on CPAP Continue CPAP at night as per protocol   DVT prophylaxis: Lovenox Code Status: DNR Family Communication: Discussed with patient's wife at the bedside postprocedure Disposition:  Status is: Inpatient  Remains inpatient appropriate because:Hemodynamically unstable, Unsafe d/c plan, and IV treatments appropriate due to intensity of illness or inability to take PO  Dispo: The patient is from: Home              Anticipated d/c is to: Awaiting evaluation for feasibility of CIR admission if not we will probably discharge home if they do not get the skilled facility that she was at   Difficult to place patient No  Consultants:  Hand surgeon Dr. Apolonio Schneiders Neuro interventional Dr. Estanislado Pandy  Procedures:   Antimicrobials:     Subjective:  Doing fair sitting up in bed conversant no pain no fever Eating drinking coherent no distress    Objective: Vitals:   04/01/21 1232 04/01/21 1244 04/01/21 1300 04/01/21 1400  BP: (!) 102/41 (!) 107/54 (!) 125/52 (!) 106/48  Pulse: 60 68 65 65  Resp: 13 18 20 16   Temp:      TempSrc:      SpO2: 95% 92% 94% 92%  Weight:      Height:  Intake/Output Summary (Last 24 hours) at 04/01/2021 1438 Last data filed at 04/01/2021 1100 Gross per 24 hour  Intake 1775.42 ml  Output 950 ml  Net 825.42 ml    Filed Weights   03/26/21 1600 03/31/21 0744 03/31/21 1210  Weight: 83.5 kg 83.5 kg  84.8 kg    Examination:  Chest clear no added sound no rales no rhonchi S1-S2 no murmur telemetry benign Abdomen soft right groin no hematoma swelling EOMI NCAT no icterus no pallor thick neck Mallampati 4 Power 5/5 no focal deficit ROM grossly intact Psych is euthymic   Data Reviewed: personally reviewed   CBC    Component Value Date/Time   WBC 7.4 04/01/2021 0445   RBC 3.21 (L) 04/01/2021 0445   HGB 8.8 (L) 04/01/2021 0445   HGB 10.2 (L) 12/12/2018 1431   HCT 27.4 (L) 04/01/2021 0445   PLT 165 04/01/2021 0445   PLT 160 12/12/2018 1431   MCV 85.4 04/01/2021 0445   MCH 27.4 04/01/2021 0445   MCHC 32.1 04/01/2021 0445   RDW 13.7 04/01/2021 0445   LYMPHSABS 1.0 04/01/2021 0445   MONOABS 0.8 04/01/2021 0445   EOSABS 0.1 04/01/2021 0445   BASOSABS 0.0 04/01/2021 0445   CMP Latest Ref Rng & Units 04/01/2021 03/31/2021 03/30/2021  Glucose 70 - 99 mg/dL 109(H) 152(H) 165(H)  BUN 8 - 23 mg/dL 22 31(H) 30(H)  Creatinine 0.61 - 1.24 mg/dL 1.21 1.75(H) 1.72(H)  Sodium 135 - 145 mmol/L 135 135 135  Potassium 3.5 - 5.1 mmol/L 3.9 3.8 4.5  Chloride 98 - 111 mmol/L 101 99 98  CO2 22 - 32 mmol/L 25 25 27   Calcium 8.9 - 10.3 mg/dL 8.5(L) 8.5(L) 9.2  Total Protein 6.5 - 8.1 g/dL - 5.2(L) -  Total Bilirubin 0.3 - 1.2 mg/dL - 0.5 -  Alkaline Phos 38 - 126 U/L - 77 -  AST 15 - 41 U/L - 15 -  ALT 0 - 44 U/L - 18 -     Radiology Studies: No results found.   Scheduled Meds:  amLODipine  10 mg Oral QHS   aspirin  81 mg Oral Daily   Or   aspirin  81 mg Per Tube Daily   atorvastatin  10 mg Oral Daily   Chlorhexidine Gluconate Cloth  6 each Topical Daily   cloNIDine  0.1 mg Transdermal Weekly   darifenacin  15 mg Oral Daily   docusate sodium  100 mg Oral BID   FLUoxetine  20 mg Oral Daily   insulin aspart  0-15 Units Subcutaneous TID WC   insulin glargine-yfgn  5 Units Subcutaneous QHS   levETIRAcetam  500 mg Oral BID   mouth rinse  15 mL Mouth Rinse BID   pantoprazole  40 mg  Oral Daily   ticagrelor  45 mg Oral BID   Continuous Infusions:  sodium chloride 75 mL/hr at 04/01/21 1100     LOS: 4 days   Time spent: [place], MD [facility] To contact the attending provider between 7A-7P or the covering provider during after hours 7P-7A, please log into the web site [email or web address] and access using universal [place] password for that web site. If you do not have the password, please call the hospital operator.  04/01/2021, 2:38 PM

## 2021-04-01 NOTE — Evaluation (Signed)
Occupational Therapy RE Evaluation Patient Details Name: Frank [name] MRN: [id number] DOB: 12-Mar-1941 Today's Date: 04/01/2021   History of Present Illness Pt admitted on 9/14 with L thumb infection and h/o recent falls (recent lesion removed from L thumb complicated by falls and thought may need antibiotics).  Pt also with planned revasculazation scheduled and had RT VA and bil common carotid arteriograms followed by revascularization of prox R ICA stenosis with stent assisted angioplasty performed on 03/31/21. 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   Clinical Impression   Patient is s/p revascularization 03/31/21 surgery resulting in functional limitations due to the deficits listed below (see OT problem list). Pt currently with balance deficits and high risk for falls. Recommendation for CIR to decrease care giver burden and maximize indep in home environment. Pt pleasant and attempting all task with a strong posterior lean.  Patient will benefit from skilled OT acutely to increase independence and safety with ADLS to allow discharge CIR.       Recommendations for follow up therapy are one component of a multi-disciplinary discharge planning process, led by the attending physician.  Recommendations may be updated based on patient status, additional functional criteria and insurance authorization.   Follow Up Recommendations  CIR    Equipment Recommendations  None recommended by OT    Recommendations for Other Services Rehab consult     Precautions / Restrictions Precautions Precautions: Fall Precaution Comments: h/o recent falls at home      Mobility Bed Mobility Overal bed mobility: Needs Assistance Bed Mobility: Supine to Sit Rolling: Min assist   Supine to sit: Mod assist     General bed mobility  comments: Cues for sliding legs to EOB then mod A to lift trunk and scoot forward using pad    Transfers Overall transfer level: Needs assistance Equipment used: Rolling walker (2 wheeled) Transfers: Sit to/from Stand Sit to Stand: Min assist;+2 physical assistance Stand pivot transfers: Mod assist;+2 physical assistance       General transfer comment: Min A x 2 to stand with RW stabilized and increased time to rise; cues for hand placement    Balance Overall balance assessment: Needs assistance Sitting-balance support: Feet supported;No upper extremity supported Sitting balance-Leahy Scale: Fair Sitting balance - Comments: able to take meds while sitting EOB Postural control: Posterior lean Standing balance support: Bilateral upper extremity supported;During functional activity Standing balance-Leahy Scale: Poor Standing balance comment: Requiring min A and RW: initially leaning posteriorly and bracing on bed able to correct with cues to increase weight on toes                           ADL either performed or assessed with clinical judgement   ADL Overall ADL's : Needs assistance/impaired Eating/Feeding: Set up;Sitting   Grooming: Minimal assistance;Sitting   Upper Body Bathing: Minimal assistance   Lower Body Bathing: Maximal assistance   Upper Body Dressing : Minimal assistance   Lower Body Dressing: Maximal assistance   Toilet Transfer: +2 for physical assistance;Minimal assistance           Functional mobility during ADLs: Rolling walker;Moderate assistance General ADL Comments: pt with initial transfers with strong posterior lean. pt could benefit from shoes to help with proprioception of feet with static standing     Vision  Perception     Praxis      Pertinent Vitals/Pain Pain Assessment: No/denies pain Faces Pain Scale: Hurts a little bit Pain Location: generally Pain Descriptors / Indicators: Discomfort Pain Intervention(s):  Limited activity within patient's tolerance     Hand Dominance Right   Extremity/Trunk Assessment Upper Extremity Assessment Upper Extremity Assessment: Generalized weakness   Lower Extremity Assessment Lower Extremity Assessment: Defer to PT evaluation   Cervical / Trunk Assessment Cervical / Trunk Assessment: Kyphotic   Communication Communication Communication: No difficulties   Cognition Arousal/Alertness: Awake/alert Behavior During Therapy: WFL for tasks assessed/performed;Flat affect Overall Cognitive Status: History of cognitive impairments - at baseline                                 General Comments: directions for sequencing transfers   General Comments  Pt's monitor alarming vtach at times with max HR 122 bpm - RN aware and felt due to pacemaker.  BP was 113/53 supine, 93/57 walk (reports mild dizziness), returned to chair was 76/36, reclined and placed SCDs 91/43.  RN present and aware.  Pt asymptomatic of hypotension.    Exercises     Shoulder Instructions      Home Living Family/patient expects to be discharged to:: Private residence Living Arrangements: Spouse/significant other Available Help at Discharge: Available 24 hours/day;Family;Personal care attendant Type of Home: House Home Access: Stairs to enter [employer] of Steps: 4 Entrance Stairs-Rails: Can reach both Home Layout: One level     Bathroom Shower/Tub: Occupational psychologist: Handicapped height Bathroom Accessibility: Yes   Home Equipment: Environmental consultant - 2 wheels;Shower seat;Grab bars - tub/shower;Grab bars - toilet;Cane - single point;Walker - 4 wheels;Wheelchair - manual   Additional Comments: Information from chart review; no family present and pt lethargic      Prior Functioning/Environment Level of Independence: Independent with assistive device(s);Needs assistance  Gait / Transfers Assistance Needed: Uses walker down steps, rollator to get to car,  in house uses w/c b/c hx of falls ADL's / Homemaking Assistance Needed: transfers to w/c to access bathroom per pt report            OT Problem List: Decreased strength;Decreased activity tolerance;Impaired balance (sitting and/or standing);Decreased cognition;Decreased safety awareness;Decreased knowledge of use of DME or AE;Decreased knowledge of precautions;Impaired tone;Pain      OT Treatment/Interventions: Self-care/ADL training;Therapeutic exercise;DME and/or AE instruction;Therapeutic activities;Patient/family education;Balance training    OT Goals(Current goals can be found in the care plan section) Acute Rehab OT Goals Patient Stated Goal: to go to rehab like befoer OT Goal Formulation: With patient Time For Goal Achievement: 04/10/21 Potential to Achieve Goals: Good  OT Frequency: Min 2X/week   Barriers to D/C:            Co-evaluation PT/OT/SLP Co-Evaluation/Treatment: Yes Reason for Co-Treatment: Complexity of the patient's impairments (multi-system involvement);Necessary to address cognition/behavior during functional activity;For patient/therapist safety;To address functional/ADL transfers PT goals addressed during session: Mobility/safety with mobility;Balance OT goals addressed during session: ADL's and self-care;Proper use of Adaptive equipment and DME;Strengthening/ROM      AM-PAC OT "6 Clicks" Daily Activity     Outcome Measure Help from another person eating meals?: A Little Help from another person taking care of personal grooming?: A Lot Help from another person toileting, which includes using toliet, bedpan, or urinal?: A Lot Help from another person bathing (including washing, rinsing, drying)?: A Lot Help from another person to  put on and taking off regular upper body clothing?: A Lot Help from another person to put on and taking off regular lower body clothing?: A Lot 6 Click Score: 13   End of Session Equipment Utilized During Treatment: Gait  belt;Rolling walker Nurse Communication: Mobility status  Activity Tolerance: Patient tolerated treatment well Patient left: in chair;with call bell/phone within reach;with chair alarm set  OT Visit Diagnosis: Unsteadiness on feet (R26.81);Other abnormalities of gait and mobility (R26.89);Muscle weakness (generalized) (M62.81);History of falling (Z91.81);Repeated falls (R29.6);Other symptoms and signs involving cognitive function;Other symptoms and signs involving the nervous system (R29.898)                Time: 0871-9941 OT Time Calculation (min): 35 min Charges:  OT General Charges $OT Visit: 1 Visit OT Evaluation $OT Re-eval: 1 Re-eval   Brynn, OTR/L  Acute Rehabilitation Services Pager: [phone] Office: [phone] .   Jeri Modena 04/01/2021, 2:13 PM

## 2021-04-02 DIAGNOSIS — L089 Local infection of the skin and subcutaneous tissue, unspecified: Secondary | ICD-10-CM | POA: Diagnosis not present

## 2021-04-02 LAB — GLUCOSE, CAPILLARY
Glucose-Capillary: 116 mg/dL — ABNORMAL HIGH (ref 70–99)
Glucose-Capillary: 163 mg/dL — ABNORMAL HIGH (ref 70–99)
Glucose-Capillary: 197 mg/dL — ABNORMAL HIGH (ref 70–99)
Glucose-Capillary: 205 mg/dL — ABNORMAL HIGH (ref 70–99)
Glucose-Capillary: 182 mg/dL — ABNORMAL HIGH (ref 70–99)
Glucose-Capillary: 166 mg/dL — ABNORMAL HIGH (ref 70–99)

## 2021-04-02 MED ORDER — WHITE PETROLATUM EX OINT
TOPICAL_OINTMENT | CUTANEOUS | Status: AC
  Administered 2021-04-02: 0.2
  Filled 2021-04-02: qty 28.35

## 2021-04-02 NOTE — Progress Notes (Signed)
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 coli cystitis causing the weakness Prior left frontoparietal subdural hematoma 11/30/2017 status post craniectomy Prior strokes more than 25 years ago, subsequent right parietal hemorrhage 3.2 X1.8 nonoperatively managed 06/2017 and went to rehab for this   Had a hand lesion removed by dermatology in the recent past by Dr. Salley Scarlet of dermatology--used Band-Aid to cover had  several accidental trips and fall lifting the left thumbnail-saw PCP was placed on p.o. antibiotic + injection but because of worsening erythema pain etc. brought to ED Dr. Apolonio Schneiders of hand surgery reviewed the images and does not feel antibiotics were needed.  Consultants  Neurointerventionalist Hand Surgeon  Procedures  Diagnostic angiogram on 03/31/2021 with angioplasty on 03/31/2021.   Antibiotics   Anti-infectives (From admission, onward)    Start     Dose/Rate Route Frequency Ordered Stop   03/31/21 0852  vancomycin (VANCOCIN) 1-5 GM/200ML-% IVPB       Note to Pharmacy: Kandy Garrison   : cabinet override      03/31/21 0852 03/31/21 0920   03/31/21 0715  vancomycin (VANCOCIN) IVPB 1000 mg/200 mL premix  Status:  Discontinued        1,000 mg 200 mL/hr over 60 Minutes Intravenous 60 min pre-op 03/30/21 1644 03/30/21 1658   03/31/21 0715  vancomycin (VANCOCIN) IVPB 1000 mg/200 mL premix        1,000 mg 200 mL/hr over 60 Minutes Intravenous 60 min pre-op 03/30/21 1658 03/31/21 1012   03/30/21 1634  vancomycin (VANCOCIN) 1,000 mg in sodium chloride 0.9 %  250 mL IVPB  Status:  Discontinued        1,000 mg 250 mL/hr over 60 Minutes Intravenous 60 min pre-op 03/30/21 1635 03/30/21 1642   03/27/21 1700  vancomycin (VANCOCIN) IVPB 1000 mg/200 mL premix  Status:  Discontinued        1,000 mg 200 mL/hr over 60 Minutes Intravenous Every 24 hours 03/26/21 1657 03/26/21 1841   03/26/21 1700  vancomycin (VANCOREADY) IVPB 1750 mg/350 mL  Status:  Discontinued        1,750 mg 175 mL/hr over 120 Minutes Intravenous  Once 03/26/21 1648 03/26/21 2124      Subjective  The patient is awake, alert, and oriented x 3. Wife is at bedside. No new complaints.  Objective   Vitals:  Vitals:   04/02/21 1500 04/02/21 1600  BP: 122/70 (!) 137/59  Pulse: 60 (!) 59  Resp: 15 13  Temp:    SpO2: 97% 97%    Exam:  Constitutional:  Awake, alert, and oriented x 3. No acute distress. Eyes:  pupils and irises appear normal Normal lids and conjunctivae Respiratory:  CTA bilaterally, no w/r/r.  Respiratory effort normal. No retractions or accessory muscle use Cardiovascular:  RRR, no m/r/g No LE extremity edema   Normal pedal pulses Abdomen:  Abdomen appears normal; no tenderness or masses No hernias No HSM Musculoskeletal:  Digits/nails XFG:HWEX thumbnail is black and is pulling away from the nail bed. Left thumb demonstrates no deformity or swellling. There is otherwise no clubbing, cyanosis, petechiae, infection. exam of joints, bones, muscles of at least one of  following: head/neck, RUE, LUE, RLE, LLE   strength and tone normal, no atrophy, no abnormal movements No tenderness, masses Skin:  No rashes, lesions, ulcers palpation of skin: no induration or nodules Neurologic:  CN 2-12 intact Sensation all 4 extremities intact Psychiatric:  Mental status: Awake, alert, and oriented x 3. Mood, affect appropriate Orientation to person, place, time  judgment and insight appear intact     I have personally reviewed the following:   Today's Data   Vitals  Micro Data  MRSA by PCR - negative COVID-19 - negative 04/01/2021  Imaging  IR CT intravascular stent IR CT head  Cardiology Data  EKG 03/26/2021  Scheduled Meds:  amLODipine  10 mg Oral QHS   aspirin  81 mg Oral Daily   Or   aspirin  81 mg Per Tube Daily   atorvastatin  10 mg Oral Daily   Chlorhexidine Gluconate Cloth  6 each Topical Daily   darifenacin  15 mg Oral Daily   docusate sodium  100 mg Oral BID   FLUoxetine  20 mg Oral Daily   insulin aspart  0-15 Units Subcutaneous TID WC   insulin glargine-yfgn  5 Units Subcutaneous QHS   levETIRAcetam  500 mg Oral BID   mouth rinse  15 mL Mouth Rinse BID   pantoprazole  40 mg Oral Daily   ticagrelor  45 mg Oral BID   Continuous Infusions:  sodium chloride      Principal Problem:   Infection of thumb Active Problems:   Hyperlipidemia with target LDL less than 100   Obstructive sleep apnea   Essential hypertension   Type 2 diabetes mellitus with peripheral neuropathy ([name])   Chronic renal disease, stage 3, moderately decreased glomerular filtration rate (GFR) between 30-59 mL/min/1.73 square meter ([name])   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 Apolonio Schneiders who gave recommendations to be admitting physician continue Norco 1-2 every 4 as needed severe pain Frequent falls Unsafe to discharge because of 2 falls recently and mild cognitive impairments Therapy services rec's CIR-consult input pending patient previously will only go to 2 specific SNF--defer to case management next steps Strokes in the past Prior large MCA infarct with hemorrhagic conversion 2/28 Right ICA stenosis with complex atherosclerotic plaque status post stent assisted angioplasty per  Dr. Estanislado Pandy 9/19 aspirin 81 mg, Keppra 500 twice daily  Blood pressure goals liberalized by neuro IR Dr Estanislado Pandy to below 140 however some hypotension earlier today- we will continue on NS 75 cc/H and if blood pressures recovered from the 1 20-1 30 range probably can clear for discharge Continue atorvastatin 10 daily R ICA stenosis diagnostic angiogram 03/31/21 under Neuro-IR Dr. Estanislado Pandy Brilinta dosing postprocedure as per them Hypertension Continue amlodipine 10 nightly, clonidine is not a home med and was discontinued Stopped Cleviprex infusion morning of 9/20 Diabetes mellitus type 2 Glipizide XL 10 mg held on admission, currently 5 units of long-acting insulin  CBG well controlled 100-1 20 OSA on CPAP Continue CPAP at night as per protocol   I have seen and examined this patient myself. I have spent 34 minutes in his evaluation and care.  DVT prophylaxis: Lovenox Code Status: DNR Family Communication: Discussed with patient's wife at the bedside postprocedure Disposition:  Status is: Inpatient  Remains inpatient appropriate because:Hemodynamically unstable, Unsafe d/c plan, and IV treatments appropriate due to intensity of illness or inability to take PO   Dispo: The patient is from: Home              Anticipated d/c is to: Awaiting evaluation for feasibility of CIR admission if not we will probably discharge home if they do not get the skilled facility that she was at              Difficult to place patient No   Zhavia Cunanan, DO [facility] Direct contact: see [email or web address]  7PM-7AM contact night coverage as above 04/02/2021, 4:30 PM  LOS: 5 days

## 2021-04-02 NOTE — Progress Notes (Signed)
Patient declined CPAP 

## 2021-04-02 NOTE — TOC Progression Note (Signed)
Transition of Care [facility]) - Progression Note    Patient Details  Name: Frank [name] MRN: [id number] Date of Birth: 12/09/1940  [facility] CM/SW [place], LCSW Phone Number: 04/02/2021, 9:16 AM  Clinical Narrative:    CSW received insurance approval for [place]:  #[id number], effective 04/02/2021-04/04/2021.    Expected Discharge Plan: [place] Barriers to Discharge: Continued Medical Work up, SNF Pending bed offer  Expected Discharge Plan and Services Expected Discharge Plan: [place] Choice: [place] (only if [place] or [place]) Living arrangements for the past 2 months: Single Family Home                                       Social Determinants of Health (SDOH) Interventions    Readmission Risk Interventions No flowsheet data found.

## 2021-04-02 NOTE — Progress Notes (Addendum)
Around 0300, the patient began to complain of pain around his rectum. The patient put out about 1250 of urine since the beginning of the shift at this point. This pain continued to worsen for the patient - unrelieved by turning and repositioning. I gave the patient tylenol in attempt to relieve this pain as well. I began palpating his stomach around 0400 and it was extremely rigid above the bladder. I bladder scanned the patient, which resulted at 780 mL. I intermittent cathed the patient at 0415 and got 1020 mL of urine from his bladder. He immediately felt complete relief of the previous pain.   The patient was unable to tell me that he felt fullness in his bladder and he never felt like he had the urge to urinate. Will continue to monitor the patient's urine output and pain until the end of my shift @  0700.   Normajean Baxter, RN

## 2021-04-02 NOTE — Progress Notes (Signed)
Inpatient Rehab Admissions Coordinator Note:  Per updated therapy recommendations patient was screened for CIR candidacy by Michel Santee, PT. At this time, pt does not appear to demonstrate medical necessity to support a CIR admission.  Discussed with Dr. Naaman Plummer and he agrees.  We will not pursue a rehab consult at this time.  Recommend other rehab venues to be pursued.  Please contact me with any questions.  Michel Santee, [place] [phone] @TODAY @ 9:33 AM

## 2021-04-03 DIAGNOSIS — R339 Retention of urine, unspecified: Secondary | ICD-10-CM | POA: Diagnosis not present

## 2021-04-03 DIAGNOSIS — Z9181 History of falling: Secondary | ICD-10-CM | POA: Diagnosis not present

## 2021-04-03 DIAGNOSIS — I63239 Cerebral infarction due to unspecified occlusion or stenosis of unspecified carotid arteries: Secondary | ICD-10-CM | POA: Diagnosis not present

## 2021-04-03 DIAGNOSIS — G4733 Obstructive sleep apnea (adult) (pediatric): Secondary | ICD-10-CM | POA: Diagnosis not present

## 2021-04-03 DIAGNOSIS — D510 Vitamin B12 deficiency anemia due to intrinsic factor deficiency: Secondary | ICD-10-CM | POA: Diagnosis not present

## 2021-04-03 DIAGNOSIS — G9389 Other specified disorders of brain: Secondary | ICD-10-CM | POA: Diagnosis not present

## 2021-04-03 DIAGNOSIS — I10 Essential (primary) hypertension: Secondary | ICD-10-CM | POA: Diagnosis not present

## 2021-04-03 DIAGNOSIS — Z23 Encounter for immunization: Secondary | ICD-10-CM | POA: Diagnosis present

## 2021-04-03 DIAGNOSIS — N183 Chronic kidney disease, stage 3 unspecified: Secondary | ICD-10-CM | POA: Diagnosis not present

## 2021-04-03 DIAGNOSIS — E785 Hyperlipidemia, unspecified: Secondary | ICD-10-CM | POA: Diagnosis not present

## 2021-04-03 DIAGNOSIS — I6203 Nontraumatic chronic subdural hemorrhage: Secondary | ICD-10-CM | POA: Diagnosis not present

## 2021-04-03 DIAGNOSIS — R531 Weakness: Secondary | ICD-10-CM | POA: Diagnosis not present

## 2021-04-03 DIAGNOSIS — Z743 Need for continuous supervision: Secondary | ICD-10-CM | POA: Diagnosis not present

## 2021-04-03 DIAGNOSIS — Z794 Long term (current) use of insulin: Secondary | ICD-10-CM | POA: Diagnosis not present

## 2021-04-03 DIAGNOSIS — L089 Local infection of the skin and subcutaneous tissue, unspecified: Secondary | ICD-10-CM | POA: Diagnosis not present

## 2021-04-03 DIAGNOSIS — R269 Unspecified abnormalities of gait and mobility: Secondary | ICD-10-CM | POA: Diagnosis not present

## 2021-04-03 DIAGNOSIS — Z87820 Personal history of traumatic brain injury: Secondary | ICD-10-CM | POA: Diagnosis not present

## 2021-04-03 DIAGNOSIS — R296 Repeated falls: Secondary | ICD-10-CM | POA: Diagnosis not present

## 2021-04-03 DIAGNOSIS — I6521 Occlusion and stenosis of right carotid artery: Secondary | ICD-10-CM | POA: Diagnosis not present

## 2021-04-03 DIAGNOSIS — R0902 Hypoxemia: Secondary | ICD-10-CM | POA: Diagnosis not present

## 2021-04-03 DIAGNOSIS — E1142 Type 2 diabetes mellitus with diabetic polyneuropathy: Secondary | ICD-10-CM | POA: Diagnosis not present

## 2021-04-03 DIAGNOSIS — F0280 Dementia in other diseases classified elsewhere without behavioral disturbance: Secondary | ICD-10-CM | POA: Diagnosis not present

## 2021-04-03 DIAGNOSIS — Z8673 Personal history of transient ischemic attack (TIA), and cerebral infarction without residual deficits: Secondary | ICD-10-CM | POA: Diagnosis not present

## 2021-04-03 LAB — BASIC METABOLIC PANEL
Anion gap: 7 (ref 5–15)
BUN: 24 mg/dL — ABNORMAL HIGH (ref 8–23)
CO2: 28 mmol/L (ref 22–32)
Calcium: 8.8 mg/dL — ABNORMAL LOW (ref 8.9–10.3)
Chloride: 100 mmol/L (ref 98–111)
Creatinine, Ser: 1.41 mg/dL — ABNORMAL HIGH (ref 0.61–1.24)
GFR, Estimated: 50 mL/min — ABNORMAL LOW (ref >60)
Glucose, Bld: 160 mg/dL — ABNORMAL HIGH (ref 70–99)
Potassium: 3.9 mmol/L (ref 3.5–5.1)
Sodium: 135 mmol/L (ref 135–145)

## 2021-04-03 LAB — GLUCOSE, CAPILLARY
Glucose-Capillary: 143 mg/dL — ABNORMAL HIGH (ref 70–99)
Glucose-Capillary: 138 mg/dL — ABNORMAL HIGH (ref 70–99)
Glucose-Capillary: 163 mg/dL — ABNORMAL HIGH (ref 70–99)
Glucose-Capillary: 141 mg/dL — ABNORMAL HIGH (ref 70–99)
Glucose-Capillary: 131 mg/dL — ABNORMAL HIGH (ref 70–99)
Glucose-Capillary: 221 mg/dL — ABNORMAL HIGH (ref 70–99)

## 2021-04-03 MED ORDER — TAMSULOSIN HCL 0.4 MG PO CAPS: 0.4000 mg | ORAL_CAPSULE | Freq: Every day | ORAL | 0 refills | Status: DC

## 2021-04-03 MED ORDER — DOCUSATE SODIUM 100 MG PO CAPS: 100.0000 mg | ORAL_CAPSULE | Freq: Two times a day (BID) | ORAL | 0 refills | Status: DC

## 2021-04-03 NOTE — Progress Notes (Signed)
Report called to [facility] facility, discussed plan of care and patient status with Casey Burkitt.   Wife at bedside, will transport all patients belongings and items to new facility.   Foley catheter removed and rentention medications given.

## 2021-04-03 NOTE — Discharge Summary (Addendum)
Physician Discharge Summary  Frank [name] SHF:[id number] DOB: 1940-08-23 DOA: 03/26/2021  PCP: Frank Lima, MD  Admit date: 03/26/2021 Discharge date: 04/03/2021  Recommendations for Outpatient Follow-up:  Discharge to rehab for ongoing PT/OT Follow up with PCP in 7-10 days. Chemistry to be drawn on that visit to be reported to PCP. Follow up with Frank [name] as directed.  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 male with medical history significant of DM; HTN; HLD; and OSA on CPAP presenting with L thumb infection.  His wife went out of town last week.  He noticed on Tuesday that he had a growth on his hand; he was seen by Dr. Elvera [name] and [facility] was removed.  He also looked at the thumb - he had fallen on 9/1 and Frank [name] had to get him off the floor.  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  It looked worse over the weekend.  Yesterday, his hand was swollen and TTP.  They went to the PCP and was given abx injection and PO antibiotic.  This AM, it was more painful, swollen, and he had streaking erythema.     Also, his wife is noticing that she has to prompt him with getting into the  car.  He has bene more belligerent.  She wondered if maybe there was another issue, since he has h/o Frank [name].  He also is due to carotid stent and it is scheduled for 9/19 - need to make sure infection is treated first.  CVA was 2/28 and he had thrombolysis -> hemorrhagic.  Went to rehab and was doing well.  Then he developed UTI and he had to start all over with rehab and was home again on 6/10 with Frank [name] after.  Ambulates with walker; uses a wheelchair in the house to prevent falls.  Has dementia, this is getting worse.  He is Frank [name].      Frank [name]:  The patient was admitted to a telemetry bed. Hand surgery evaluated films of the left hand. He did not feel that antibiotics were warranted. CT head again demonstrated Frank [name] stent. The patient was again found to have complicated and symptomatic Frank [name] stenosis. Frank [name] was consulted and the patient underwent Frank [name] and bilateral common carotid artreriograms followed by revascularization of prox Frank [name] stenosis associated with a complex atherosclerotic plaque with Stent assisted angioplasty and distal protection.He tolerated the procedure well. Blood pressures have been well controlled. He was started on Frank [name] with Frank [name] and Frank [name]. The patient was evaluated by PT/OT. They have recommended inpatient rehab. The patient will be discharged to inpatient rehab in good condition today.  Today's assessment: S: The patient is awake, alert, and oriented x 3. Very sleepy, but rousable.  O: Vitals:  Vitals:   04/03/21 1148 04/03/21 1200  BP:  134/62  Pulse:  65  Resp:  (!) 9  Temp: 98.9 F (37.2 C)   SpO2:  95%    Constitutional:  Awake, alert, and oriented x 3. No acute distress. Eyes:  pupils and irises appear normal Normal lids and conjunctivae Respiratory:  CTA bilaterally, no w/r/r.  Respiratory effort normal. No retractions or accessory muscle use Cardiovascular:  RRR, no m/r/g No LE extremity edema   Normal pedal pulses Abdomen:   Abdomen appears normal; no tenderness or masses No hernias No HSM Musculoskeletal:  Digits/nails WER:XVQM thumbnail is black and is pulling away from the nail bed. Left thumb demonstrates no deformity or swellling. There is otherwise no clubbing, cyanosis, petechiae, infection. exam of joints, bones, muscles of at least one of following: head/neck, RUE, LUE, RLE, LLE   strength and tone normal, no atrophy, no abnormal movements No tenderness, masses Skin:  No rashes, lesions, ulcers palpation of skin: no induration or nodules Neurologic:  CN 2-12 intact Sensation all 4 extremities intact Psychiatric:  Mental status: Awake, alert, and oriented x 3. Mood, affect appropriate Orientation to person, place, time  judgment and insight appear intact     Discharge Instructions  Discharge Instructions     Activity as tolerated - No restrictions   Complete by: As directed    Call MD for:  difficulty breathing, headache or visual disturbances   Complete by: As directed    Call MD for:  persistant nausea and vomiting   Complete by: As directed    Call MD for:  redness, tenderness, or signs of infection (pain, swelling, redness, odor or green/yellow discharge around incision site)   Complete by: As directed    Call MD for:  severe uncontrolled pain   Complete by: As directed    Call MD for:  temperature >100.4   Complete by: As directed    Diet - low sodium heart healthy   Complete by: As directed    Diet Carb Modified   Complete by: As directed    Discharge instructions   Complete by: As directed    Discharge to rehab for ongoing PT/OT. Follow up with PCP in 7-10 days. Chemistry to be drawn at that visit. Follow up with IR as directed.   Increase activity slowly   Complete by: As directed       Allergies as of 04/03/2021       Reactions   Lisinopril Cough   Invokana [canagliflozin] Other (See Comments)   Stopped by MD, unknown reaction    Penicillins Other (See Comments)    Allergic as a child.  Patient does not remember reaction.  Has had cephalasporins without problems.   Sulfamethoxazole Other (See Comments)   Unknown reaction- from childhood        Medication List     STOP taking these medications    amoxicillin-clavulanate 875-125 MG tablet Commonly known as: AUGMENTIN       TAKE these medications    ACCRUFeR 30 MG Caps Generic drug: Ferric Maltol Take 1 capsule by mouth in the morning and at bedtime. What changed: how much to take   acetaminophen 500 MG tablet Commonly known as: TYLENOL Take 500-1,000 mg by mouth every 6 (six) hours as needed for moderate pain or headache.   amLODipine 10 MG tablet Commonly known as: NORVASC Take 1 tablet (10 mg total) by mouth daily. What changed: when to take this  aspirin 81 MG chewable tablet Chew 1 tablet (81 mg total) by mouth daily.   atorvastatin 10 MG tablet Commonly known as: LIPITOR Take 1 tablet (10 mg total) by mouth daily.   docusate sodium 100 MG capsule Commonly known as: COLACE Take 1 capsule (100 mg total) by mouth 2 (two) times daily.   FLUoxetine 20 MG capsule Commonly known as: PROZAC TAKE 1 CAPSULE BY MOUTH  DAILY   fluticasone 50 MCG/ACT nasal spray Commonly known as: FLONASE Place 1-2 sprays into both nostrils daily as needed for allergies or rhinitis.   glipiZIDE 10 MG 24 hr tablet Commonly known as: GLUCOTROL XL TAKE 1 TABLET BY MOUTH  DAILY   Insulin Pen Needle 32G X 6 MM Misc 1 Act by Does not apply route daily.   levETIRAcetam 500 MG tablet Commonly known as: KEPPRA Take 1 tablet (500 mg total) by mouth 2 (two) times daily.   pantoprazole 40 MG tablet Commonly known as: PROTONIX TAKE 1 TABLET BY MOUTH  DAILY   PRESCRIPTION MEDICATION CPAP- At bedtime   solifenacin 10 MG tablet Commonly known as: VESICARE TAKE 1 TABLET BY MOUTH  DAILY   tamsulosin 0.4 MG Caps capsule Commonly known as: Flomax Take 1 capsule (0.4 mg total) by mouth daily  after supper.   ticagrelor 90 MG Tabs tablet Commonly known as: BRILINTA Take 0.5 tablets (45 mg total) by mouth 2 (two) times daily.   Toujeo Max SoloStar 300 UNIT/ML Solostar Pen Generic drug: insulin glargine (2 Unit Dial) Inject 10 Units into the skin daily.       Allergies  Allergen Reactions   Lisinopril Cough   Invokana [Canagliflozin] Other (See Comments)    Stopped by MD, unknown reaction    Penicillins Other (See Comments)    Allergic as a child.  Patient does not remember reaction.  Has had cephalasporins without problems.   Sulfamethoxazole Other (See Comments)    Unknown reaction- from childhood    The results of significant diagnostics from this hospitalization (including imaging, microbiology, ancillary and laboratory) are listed below for reference.    Significant Diagnostic Studies: CT HEAD WO CONTRAST (5MM)  Result Date: 03/26/2021 CLINICAL DATA:  Confusion.  History of traumatic brain injury. EXAM: CT HEAD WITHOUT CONTRAST TECHNIQUE: Contiguous axial images were obtained from the base of the skull through the vertex without intravenous contrast. COMPARISON:  None. 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 Frank-sided chronic subdural collection is also unchanged. There is unchanged encephalomalacia of the Frank parietal and temporal lobes. There is periventricular hypoattenuation compatible with chronic microvascular disease. Vascular: Unchanged Frank [name] branch stent. Skull: Remote Frank frontal burr hole and left frontoparietal craniectomy. Sinuses/Orbits: No fluid levels or advanced mucosal thickening of the visualized paranasal sinuses. No mastoid or middle ear effusion. The orbits are normal. IMPRESSION: 1. Unchanged bilateral chronic subdural hematomas. No acute abnormality. 2. Unchanged encephalomalacia of the Frank parietal and temporal lobes. 3. Unchanged Frank [name] branch stent. Electronically Signed   By: Ulyses Jarred M.D.    On: 03/26/2021 19:02   IR INTRAVSC STENT CERV CAROTID W/EMB-PROT MOD SED  Result Date: 04/02/2021 CLINICAL DATA:  Symptomatic proximal Frank [name] stenosis secondary to complex atherosclerotic plaque. EXAM: IR CT HEAD LIMITED; INTRAVSC STENT CERV CAROTID W/ EMB-PROT COMPARISON:  CT angiogram of the head and neck of Nov 18, 2020, and diagnostic catheter arteriogram of September 09, 2020. MEDICATIONS: Heparin 3,000 units IV. Vancomycin 1 g IV antibiotic was administered within 1 hour of the  procedure. ANESTHESIA/SEDATION: General anesthesia. CONTRAST:  Omnipaque 240, 150 mL. FLUOROSCOPY TIME:  Fluoroscopy Time: 23 minutes 0 seconds (1203 mGy). COMPLICATIONS: None immediate. TECHNIQUE: Informed written consent was obtained from the patient after a thorough discussion of the procedural risks, benefits and alternatives. All questions were addressed. Maximal Sterile Barrier Technique was utilized including caps, mask, sterile gowns, sterile gloves, sterile drape, hand hygiene and skin antiseptic. A timeout was performed prior to the initiation of the procedure. The Frank groin was prepped and draped in the usual sterile fashion. Thereafter using modified Seldinger technique, transfemoral access into the Frank common femoral artery was obtained without difficulty. Over a 0.035 inch guidewire, an 8 [place] Pinnacle 25 cm sheath was inserted. Through this, and also over 0.035 inch guidewire, a 5 [place] JB 1 catheter was advanced to the aortic arch region and selectively positioned in the Frank common carotid artery, the left common carotid artery and the innominate artery. FINDINGS: The left common carotid arteriogram demonstrates modest diffuse atherosclerotic irregularity of the left common carotid artery to the bifurcation. The left external carotid artery origin and branches are widely patent. The left internal carotid artery at the bulb demonstrates a heterogeneous contrast density at the carotid bifurcation extending  into the left carotid bulb without evidence of significant hemodynamic stenosis. Distal to this the left internal carotid artery opacifies normally to the cranial skull base. The petrous, cavernous and the supraclinoid segments are widely patent. Left middle cerebral artery opacifies into the capillary and venous phases. The left anterior cerebral artery opacifies into the capillary and venous phases. There is prompt cross filling via the anterior communicating artery into the Frank anterior cerebral A2 segment and distally with mixing of unopacified blood from the Frank internal carotid artery. 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 It measures 4.3 mm x 3.4 mm. Transient opacification of the left posterior communicating artery is seen. The innominate arteriogram demonstrates patency of the Frank common carotid artery proximally, and the Frank subclavian artery. There is mild stenosis at the origin of the Frank vertebral artery. More distally the vessel is seen to opacify to the cranial skull base. The lateral images demonstrate opacification of the left vertebrobasilar junction and the left posterior-inferior cerebellar artery. Suggestion of a moderate stenosis of the Frank vertebrobasilar junction just distal to the origin of the Frank posterior-inferior cerebellar artery. Frank common carotid arteriogram demonstrates mild atherosclerotic irregularity of the Frank common carotid artery in its mid segment. The Frank common carotid bifurcation demonstrates a complex atherosclerotic plaque with proximally 50% stenosis at the origin of the Frank external carotid artery. Its branches opacify normally. The Frank internal carotid artery proximally and just distally demonstrates a complex heterogeneous opacification of the bulb with 2 areas of tubular stenosis, one at the distal end of the bulb and one at the proximal  aspect of the bulb. These represent focal areas of stenosis of approximately 70%. Distal to this the Frank internal carotid artery opacifies widely to the cranial skull base. The petrous, cavernous and supraclinoid segments are widely patent. The Frank middle cerebral artery opacifies into the capillary and venous phases. Previously positioned stent in the M2 segment of the inferior division is widely patent without evidence of intra stent stenosis. The Frank anterior cerebral artery opacifies proximally and distally with mixing of unopacified blood from the contralateral left A1 segment. ENDOVASCULAR REVASCULARIZATION OF PROXIMAL Frank INTERNAL CAROTID ARTERY STENOSIS WITH STENT ASSISTED ANGIOPLASTY AND  DISTAL PROTECTION. The diagnostic JB 1 catheter in the Frank common carotid artery was exchanged over a 0.035 inch 300 cm Rosen exchange guidewire for an 087 balloon guide catheter which had been prepped with 50% contrast and 50% heparinized saline infusion. Good aspiration was obtained from the hub of the balloon guide catheter. A control arteriogram was then performed extracranially and intracranially. Measurements were then performed of the Frank internal carotid artery and the Frank common carotid artery at the distal and the proximal landing zones proximally. A 4 6/5 mm Emboshield NAV distal protection device was then prepped and purged with heparinized saline infusion in its housing. The air was cleared from within the protection device and the delivery microcatheter. The Emboshield was then captured into the delivery microcatheter. The combination of the 014 inch micro guidewire, and the delivery microcatheter was then retrieved. A moderate J configuration was then given to the distal end of the micro guidewire. Using biplane roadmap technique and constant fluoroscopic guidance in a coaxial manner and with constant heparinized saline infusion, the delivery microcatheter of the protection device was then  advanced to just proximal to the Frank common carotid bifurcation. Using a torque device, the micro guidewire was then gently advanced without difficulty through the complex plaque and into the petrous segment with the distal protection device. The Emboshield protection device was then deployed in the usual manner. The delivery microcatheter was retrieved and removed under constant observation. A gentle control arteriogram performed through balloon guide catheter in the Frank common carotid artery demonstrated no change in the extracranial or intracranial circulations. A 4 mm x 30 mm Viatrac 14 angioplasty catheter was then prepped and purged with heparinized saline infusion retrogradely, and 50% contrast and 50% heparinized saline infusion antegradely. Thereafter using rapid exchange technique, the balloon catheter was advanced without difficulty and positioned distally and proximally across the stenosis in the Frank internal carotid artery. A control inflation was then performed using micro inflation syringe device via micro tubing to normal diameter of 4 mm where it was maintained for approximately 30 seconds. The balloon was then deflated and retrieved and removed with the distal protection device maintained in a stable position. Control arteriogram performed through the balloon guide in the common carotid artery demonstrated improved caliber and flow through the angioplastied segment and distally. Measurements were then performed of the proximal landing zone of the stent in the Frank common carotid artery, and distally in the Frank internal carotid artery. It was elected to proceed with placement of an 8/6 mm x 40 mm Xact stent. The delivery apparatus of the stent was then purged with heparinized saline infusion. Thereafter using the rapid exchange technique, the stent delivery apparatus was then advanced to the Frank internal carotid artery proximally. The proximal and distal landing zones were then  identified. The stent was then deployed in the usual manner without any difficulty. The delivery apparatus was removed. A control arteriogram performed through the balloon guide in the Frank common carotid artery demonstrates significantly improved caliber and flow through the Frank internal carotid artery extra cranially and intracranially. Given the waist, it was elected to proceed with a post stent angioplasty. A 5 mm x 30 mm Viatrac 14 balloon catheter was prepped in the usual manner as described above. Using the rapid exchange technique, this was then advanced whereby the proximal and distal markers were positioned adequate distant from the waist. Again a control inflation was performed using micro inflation syringe device via micro tubing to normal atmosphere attaining  a 5 mm diameter inflation. This was maintained for approximately 20 seconds. Thereafter the balloon was deflated and retrieved and removed. A control arteriogram performed through the Frank common carotid artery demonstrated straightening of the waist. The filter capture device was then advanced under constant fluoroscopic guidance to the proximal marker of the filter device. This was then captured by retrieving the wire with the filter into the capture device. The combination was then removed without any difficulty. Control arteriograms were then performed via the balloon guide at 15 and 30 minutes post stent dilatation. These continued to demonstrate excellent flow through the proximal Frank internal carotid artery and intracranially without evidence of intraluminal filling defects or of occlusions. During this time the patient was also given total of 4.5 mg of intra-arterial Integrilin in order to prevent platelet aggregation within the stent at the site of the complex atherosclerotic plaque. A final control arteriogram performed through the balloon guide in the Frank common carotid artery continued to demonstrate excellent flow without  intravascular filling defects. The balloon guide was then removed. The 8 [place] Pinnacle sheath was removed with hemostasis achieved with an 8 [place] Angio-Seal closure device. Distal pulses remained palpable. A CT of the brain performed Frank after demonstrated no evidence of intracranial hemorrhage. 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 Patient was then extubated. Patient recovered slowly from the general anesthesia being able to move all his fours spontaneously and to command. He denied any headaches, nausea or vomiting. He was then transferred to the PACU and then neuro ICU for post revascularization management. Later that evening, the patient was at his preprocedural neurological status. He denied any headaches, nausea or vomiting or visual symptoms. Motor wise he remained stable with minimal pronation drift off the left outstretched arm. His IV heparin was discontinued the following morning. The patient was switched to Brilinta 190 mg p.o. for followed by 45 mg b.i.d. The Frank groin appeared soft. Distal pulses were palpable. Neurologically, the patient remained unchanged intact. Patient was advised to refrain from stooping, bending or lifting weights above 10 pounds for 2 weeks. Patient also advised to refrain from driving and to maintain adequate hydration. 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 PLAN: Follow-up in the clinic 2 weeks post discharge. Platelet inhibition test will also be performed today. Electronically Signed   By: Luanne Bras M.D.   On: 04/01/2021 09:51   IR CT Head Ltd  Result Date: 04/02/2021 CLINICAL DATA:  Symptomatic proximal Frank [name] stenosis secondary to complex atherosclerotic plaque. EXAM: IR CT HEAD LIMITED; INTRAVSC STENT CERV CAROTID W/ EMB-PROT COMPARISON:   CT angiogram of the head and neck of Nov 18, 2020, and diagnostic catheter arteriogram of September 09, 2020. MEDICATIONS: Heparin 3,000 units IV. Vancomycin 1 g IV antibiotic was administered within 1 hour of the procedure. ANESTHESIA/SEDATION: General anesthesia. CONTRAST:  Omnipaque 240, 150 mL. FLUOROSCOPY TIME:  Fluoroscopy Time: 23 minutes 0 seconds (1203 mGy). COMPLICATIONS: None immediate. TECHNIQUE: Informed written consent was obtained from the patient after a thorough discussion of the procedural risks, benefits and alternatives. All questions were addressed. Maximal Sterile Barrier Technique was utilized including caps, mask, sterile gowns, sterile gloves, sterile drape, hand hygiene and skin antiseptic. A timeout was performed prior to the initiation of the procedure. The Frank groin was prepped and draped in the usual sterile fashion. Thereafter using modified Seldinger technique, transfemoral  access into the Frank common femoral artery was obtained without difficulty. Over a 0.035 inch guidewire, an 8 [place] Pinnacle 25 cm sheath was inserted. Through this, and also over 0.035 inch guidewire, a 5 [place] JB 1 catheter was advanced to the aortic arch region and selectively positioned in the Frank common carotid artery, the left common carotid artery and the innominate artery. FINDINGS: The left common carotid arteriogram demonstrates modest diffuse atherosclerotic irregularity of the left common carotid artery to the bifurcation. The left external carotid artery origin and branches are widely patent. The left internal carotid artery at the bulb demonstrates a heterogeneous contrast density at the carotid bifurcation extending into the left carotid bulb without evidence of significant hemodynamic stenosis. Distal to this the left internal carotid artery opacifies normally to the cranial skull base. The petrous, cavernous and the supraclinoid segments are widely patent. Left middle cerebral artery opacifies  into the capillary and venous phases. The left anterior cerebral artery opacifies into the capillary and venous phases. There is prompt cross filling via the anterior communicating artery into the Frank anterior cerebral A2 segment and distally with mixing of unopacified blood from the Frank internal carotid artery. 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 It measures 4.3 mm x 3.4 mm. Transient opacification of the left posterior communicating artery is seen. The innominate arteriogram demonstrates patency of the Frank common carotid artery proximally, and the Frank subclavian artery. There is mild stenosis at the origin of the Frank vertebral artery. More distally the vessel is seen to opacify to the cranial skull base. The lateral images demonstrate opacification of the left vertebrobasilar junction and the left posterior-inferior cerebellar artery. Suggestion of a moderate stenosis of the Frank vertebrobasilar junction just distal to the origin of the Frank posterior-inferior cerebellar artery. Frank common carotid arteriogram demonstrates mild atherosclerotic irregularity of the Frank common carotid artery in its mid segment. The Frank common carotid bifurcation demonstrates a complex atherosclerotic plaque with proximally 50% stenosis at the origin of the Frank external carotid artery. Its branches opacify normally. The Frank internal carotid artery proximally and just distally demonstrates a complex heterogeneous opacification of the bulb with 2 areas of tubular stenosis, one at the distal end of the bulb and one at the proximal aspect of the bulb. These represent focal areas of stenosis of approximately 70%. Distal to this the Frank internal carotid artery opacifies widely to the cranial skull base. The petrous, cavernous and supraclinoid segments are widely patent. The Frank middle cerebral artery opacifies  into the capillary and venous phases. Previously positioned stent in the M2 segment of the inferior division is widely patent without evidence of intra stent stenosis. The Frank anterior cerebral artery opacifies proximally and distally with mixing of unopacified blood from the contralateral left A1 segment. ENDOVASCULAR REVASCULARIZATION OF PROXIMAL Frank INTERNAL CAROTID ARTERY STENOSIS WITH STENT ASSISTED ANGIOPLASTY AND DISTAL PROTECTION. The diagnostic JB 1 catheter in the Frank common carotid artery was exchanged over a 0.035 inch 300 cm Rosen exchange guidewire for an 087 balloon guide catheter which had been prepped with 50% contrast and 50% heparinized saline infusion. Good aspiration was obtained from the hub of the balloon guide catheter. A control arteriogram was then performed extracranially and intracranially. Measurements were then performed of the Frank internal carotid artery and the Frank common carotid artery at the distal and the proximal landing zones proximally. A 4 6/5 mm Emboshield NAV distal protection device  was then prepped and purged with heparinized saline infusion in its housing. The air was cleared from within the protection device and the delivery microcatheter. The Emboshield was then captured into the delivery microcatheter. The combination of the 014 inch micro guidewire, and the delivery microcatheter was then retrieved. A moderate J configuration was then given to the distal end of the micro guidewire. Using biplane roadmap technique and constant fluoroscopic guidance in a coaxial manner and with constant heparinized saline infusion, the delivery microcatheter of the protection device was then advanced to just proximal to the Frank common carotid bifurcation. Using a torque device, the micro guidewire was then gently advanced without difficulty through the complex plaque and into the petrous segment with the distal protection device. The Emboshield protection device was then  deployed in the usual manner. The delivery microcatheter was retrieved and removed under constant observation. A gentle control arteriogram performed through balloon guide catheter in the Frank common carotid artery demonstrated no change in the extracranial or intracranial circulations. A 4 mm x 30 mm Viatrac 14 angioplasty catheter was then prepped and purged with heparinized saline infusion retrogradely, and 50% contrast and 50% heparinized saline infusion antegradely. Thereafter using rapid exchange technique, the balloon catheter was advanced without difficulty and positioned distally and proximally across the stenosis in the Frank internal carotid artery. A control inflation was then performed using micro inflation syringe device via micro tubing to normal diameter of 4 mm where it was maintained for approximately 30 seconds. The balloon was then deflated and retrieved and removed with the distal protection device maintained in a stable position. Control arteriogram performed through the balloon guide in the common carotid artery demonstrated improved caliber and flow through the angioplastied segment and distally. Measurements were then performed of the proximal landing zone of the stent in the Frank common carotid artery, and distally in the Frank internal carotid artery. It was elected to proceed with placement of an 8/6 mm x 40 mm Xact stent. The delivery apparatus of the stent was then purged with heparinized saline infusion. Thereafter using the rapid exchange technique, the stent delivery apparatus was then advanced to the Frank internal carotid artery proximally. The proximal and distal landing zones were then identified. The stent was then deployed in the usual manner without any difficulty. The delivery apparatus was removed. A control arteriogram performed through the balloon guide in the Frank common carotid artery demonstrates significantly improved caliber and flow through the Frank internal  carotid artery extra cranially and intracranially. Given the waist, it was elected to proceed with a post stent angioplasty. A 5 mm x 30 mm Viatrac 14 balloon catheter was prepped in the usual manner as described above. Using the rapid exchange technique, this was then advanced whereby the proximal and distal markers were positioned adequate distant from the waist. Again a control inflation was performed using micro inflation syringe device via micro tubing to normal atmosphere attaining a 5 mm diameter inflation. This was maintained for approximately 20 seconds. Thereafter the balloon was deflated and retrieved and removed. A control arteriogram performed through the Frank common carotid artery demonstrated straightening of the waist. The filter capture device was then advanced under constant fluoroscopic guidance to the proximal marker of the filter device. This was then captured by retrieving the wire with the filter into the capture device. The combination was then removed without any difficulty. Control arteriograms were then performed via the balloon guide at 15 and 30 minutes post stent dilatation. These continued  to demonstrate excellent flow through the proximal Frank internal carotid artery and intracranially without evidence of intraluminal filling defects or of occlusions. During this time the patient was also given total of 4.5 mg of intra-arterial Integrilin in order to prevent platelet aggregation within the stent at the site of the complex atherosclerotic plaque. A final control arteriogram performed through the balloon guide in the Frank common carotid artery continued to demonstrate excellent flow without intravascular filling defects. The balloon guide was then removed. The 8 French Pinnacle sheath was removed with hemostasis achieved with an 8 [place] Angio-Seal closure device. Distal pulses remained palpable. A CT of the brain performed Frank after demonstrated no evidence of intracranial  hemorrhage. 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 Patient was then extubated. Patient recovered slowly from the general anesthesia being able to move all his fours spontaneously and to command. He denied any headaches, nausea or vomiting. He was then transferred to the PACU and then neuro ICU for post revascularization management. Later that evening, the patient was at his preprocedural neurological status. He denied any headaches, nausea or vomiting or visual symptoms. Motor wise he remained stable with minimal pronation drift off the left outstretched arm. His IV heparin was discontinued the following morning. The patient was switched to Brilinta 190 mg p.o. for followed by 45 mg b.i.d. The Frank groin appeared soft. Distal pulses were palpable. Neurologically, the patient remained unchanged intact. Patient was advised to refrain from stooping, bending or lifting weights above 10 pounds for 2 weeks. Patient also advised to refrain from driving and to maintain adequate hydration. 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 PLAN: Follow-up in the clinic 2 weeks post discharge. Platelet inhibition test will also be performed today. Electronically Signed   By: Luanne Bras M.D.   On: 04/01/2021 09:51   DG Finger Thumb Left  Result Date: 03/26/2021 CLINICAL DATA:  From infection. EXAM: LEFT THUMB 2+V COMPARISON:  None. FINDINGS: No evidence of fracture. No subluxation or dislocation. No radiopaque soft tissue foreign body. No evidence for bony destruction to suggest osteomyelitis. IMPRESSION: No evidence for bony destruction or radiopaque soft tissue foreign body. Electronically Signed   By: Misty Stanley M.D.   On: 03/26/2021 09:44   IR Radiologist Eval & Mgmt  Result Date: 03/10/2021 EXAM:  ESTABLISHED PATIENT OFFICE VISIT CHIEF COMPLAINT: Follow-up visit. Current Pain Level: 1-10 HISTORY OF PRESENT ILLNESS: Patient is an 80 year old Frank handed gentleman who returns in follow-up with spouse. According to the patient and spouse there has been significant improvement in the patient's overall condition. She reports he is now able to walk independently with a walker and able to cope with most of the daily living activities. He has no problems feeding himself or taking a shower. He does need some help with shaving. She reports the patient is active in reading email and using his computer. He does not drive. He reports that he is able to sleep without any difficulty. Denies any symptoms of nausea, vomiting, visual blurring, though does need reading glasses to work on the computer and study. He reports no significant change in the decreased visual field in the left lower quadrant since the stroke. He reports no episodes of loss of consciousness, or of seizure-like activity. He denies any recent chills, fever or rigors. He denies any difficulty with urination, dysuria or hematuria. He denies any  constipation, diarrhea, melena, or difficulty swallowing liquids or solids. No recent chest pain, shortness of breath, wheezing, coughing up of sputum or of hemoptysis. Easy bruisability probably related to patient's use of aspirin and Brilinta. Diagnosis * : Date . * : Allergy * : * : Rhinitis . * : Anemia * : * : NOS iron deficient and B12 deficient . * : Arthritis * : . * : AV block, Mobitz 2 * : 05/14/2020 . * : Diabetes mellitus * : * : Type 2 . * : GERD (gastroesophageal reflux disease) * : . * : Hyperlipidemia * : . * : Hypertension * : . 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 * : OSA (obstructive sleep apnea) * : * : cpap . * : Frank [name] (traumatic brain injury) ([place]) * : PAST SURGICAL HISTORY: Past Surgical History: Procedure * : Laterality * : Date . * : APPENDECTOMY * : * : . * : arm fracture * : Left  * : 2011 * : with hardware . * Trudee Kuster HOLE * : Bilateral * : 08/04/2017 * : Procedure: Haskell Flirt; Surgeon: [name], MD; Location: [place]; Service: Neurosurgery; Laterality: Bilateral; . * : COLONOSCOPY * : * : . * : CRANIOTOMY * : Left * : 12/01/2017 * : Procedure: Trudee Kuster Holes CRANIOTOMY HEMATOMA EVACUATION SUBDURAL; Surgeon: [name], MD; Location: [facility]; Service: Neurosurgery; Laterality: Left; . * : ESOPHAGOGASTRODUODENOSCOPY * : * : 2006 * : gastritis . * : IR PERCUTANEOUS ART THROMBECTOMY/INFUSION INTRACRANIAL INC DIAG ANGIO * : * : 09/09/2020 . * : PACEMAKER IMPLANT * : N/A * : 05/14/2020 * : Procedure: PACEMAKER IMPLANT; Surgeon: [name], MD; Location: [facility]; Service: Cardiovascular; Laterality: N/A; . * : RADIOLOGY WITH ANESTHESIA * : N/A * : 09/09/2020 * : Procedure: IR WITH ANESTHESIA; Surgeon: Radiologist, Medication, MD; Location: [facility]; Service: Radiology; Laterality: N/A; . * : ROTATOR CUFF REPAIR * : * : . * : SEPTOPLASTY * : * : 1959 * : Deviated Septum . 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 * : amLODipine (NORVASC) 10 MG tablet * : Take 1 tablet (10 mg total) by mouth daily. * : * : . * : aspirin 81 MG chewable tablet * : Chew 1 tablet (81 mg total) by mouth daily. * : * : . * : atorvastatin (LIPITOR) 10 MG tablet * : Take 1 tablet (10 mg total) by mouth daily. * : * : . * : benzonatate (TESSALON) 100 MG capsule * : Take 1 capsule (100 mg total) by mouth 3 (three) times daily. * : 20 capsule * : 0 . * : cloNIDine (CATAPRES - DOSED IN MG/24 HR) 0.1 mg/24hr patch * : Place 1 patch (0.1 mg total) onto the skin once a week. (Patient not taking: Reported on 01/03/2021) * : 12 patch * : 1 . * : Dextromethorphan-guaiFENesin (ROBITUSSIN DM PO) * : Take 15 mLs by mouth See admin instructions. Qid x 7 days * : * : . * : Ferric Maltol (ACCRUFER) 30 MG CAPS * : Take 1  capsule by mouth in the morning and at bedtime. (Patient taking differently: Take 30 mg by mouth in the morning and at bedtime.) * : 180 capsule * : 1 . * : FLUoxetine (PROZAC) 20  MG capsule * : TAKE 1 CAPSULE BY MOUTH DAILY (Patient taking differently: Take 20 mg by mouth daily.) * : 90 capsule * : 1 . * : fluticasone (FLONASE) 50 MCG/ACT nasal spray * : Place 1 spray into both nostrils daily. * : * : . * : glipiZIDE (GLUCOTROL XL) 10 MG 24 hr tablet * : TAKE 1 TABLET BY MOUTH  DAILY * : 90 tablet * : 0 . * : Glucagon (GVOKE HYPOPEN 2-PACK) 1 MG/0.2ML SOAJ * : Inject 1 Act into the skin daily as needed. (Patient taking differently: Inject 1 each into the skin as needed (low blood sugar).) * : 2 mL * : 5 . * : guaiFENesin (ROBITUSSIN) 100 MG/5ML SOLN * : Take 5 mLs (100 mg total) by mouth every 4 (four) hours as needed for cough or to loosen phlegm. * : 236 mL * : 0 . * : hydrocortisone cream 1 % * : Apply 1 application topically 4 (four) times daily as needed for itching. * : * : . * : Insulin Pen Needle 32G X 6 MM MISC * : 1 Act by Does not apply route daily. * : 100 each * : 1 . * : levETIRAcetam (KEPPRA) 500 MG tablet * : Take 500 mg by mouth 2 (two) times daily. * : * : . * : loratadine (CLARITIN) 10 MG tablet * : Take 1 tablet (10 mg total) by mouth every evening. * : * : . * : OVER THE COUNTER MEDICATION * : Take 120 mLs by mouth in the morning and at bedtime. Med pass 2.0 * : * : . * : OXYGEN * : Inhale 2-4 L into the lungs See admin instructions. To keep O2 greater than 90% * : * : . * : pantoprazole (PROTONIX) 40 MG tablet * : TAKE 1 TABLET BY MOUTH DAILY (Patient taking differently: Take 40 mg by mouth daily.) * : 90 tablet * : 1 . * : PRESCRIPTION MEDICATION * : CPAP- At bedtime * : * : . * : senna-docusate (SENOKOT-S) 8.6-50 MG tablet * : Take 1 tablet by mouth at bedtime as needed for mild constipation. * : * : . * : solifenacin (VESICARE) 10 MG tablet * : Take 1 tablet (10 mg total) by mouth daily. *  : 90 tablet * : 1 . * : ticagrelor (BRILINTA) 90 MG TABS tablet * : Take 0.5 tablets (45 mg total) by mouth 2 (two) times daily. * : 180 tablet * : 0 . * : TOUJEO MAX SOLOSTAR 300 UNIT/ML Solostar Pen * : INJECT SUBCUTANEOUSLY 10  UNITS DAILY * : 6 mL * : 1 ALLERGIES: Allergies Allergen * : Reactions . * : Lisinopril * : Cough . * : Invokana Canagliflozin * : Other (See Comments) * : * : Stopped by MD . * : Penicillins * : Other (See Comments) * : * : Allergic as a child. Patient does not remember reaction. Has had cephalasporins without problems. . * : Sulfamethoxazole * : Other (See Comments) * : * : Unknown reaction- from childhood FAMILY HISTORY: Family History Problem * : Relation * : Age of Onset . * : Heart disease * : Mother * : . * : Breast cancer * : Mother * : . * : Stroke * : Father * : . * : Allergies * : Father * : * :     Father and children . * : Coronary artery disease * :  Brother * : . * : Diabetes * : Neg Hx * : . * : Colon cancer * : Neg Hx * : REVIEW OF SYSTEMS: Negative unless as mentioned above. PHYSICAL EXAMINATION: Patient is awake, alert and oriented to time, place, space. Normal eye contact. Appears in no acute distress. Neurologically grossly intact except for decrease in the left lower quadrant visual field on confrontation. Coordination: No difficulty with finger-to-nose testing bilaterally. Station gait not tested. ASSESSMENT AND PLAN: Given the patient's significant clinical improvement with physical therapy, suggested it would be reasonable to undertake a diagnostic catheter arteriogram in order to confirm the recently noted string sign stenosis in the Frank internal carotid artery proximally seen on CT angiogram of the head and neck of 11/18/2020. The spouse and the patient are agreeable to proceed with the diagnostic catheter arteriogram in the presence of anesthesia with intent to treat the Frank internal carotid artery proximal stenosis. Patient was advised to continue with his  present medications. A platelet inhibition test will be performed today in order to evaluate and/or to make any adjustments to the dose given the patient's symptoms of easy bruisability. 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 The procedure will be scheduled sometime in mid to the third week in September 2022. In the meantime, the patient advised to continue with twice a week outpatient physical therapy as scheduled. Electronically Signed   By: Luanne Bras M.D.   On: 03/07/2021 13:55   IR ANGIO INTRA EXTRACRAN SEL COM CAROTID INNOMINATE UNI L MOD SED  Result Date: 04/02/2021 CLINICAL DATA:  Symptomatic proximal Frank [name] stenosis secondary to complex atherosclerotic plaque. EXAM: IR CT HEAD LIMITED; INTRAVSC STENT CERV CAROTID W/ EMB-PROT COMPARISON:  CT angiogram of the head and neck of Nov 18, 2020, and diagnostic catheter arteriogram of September 09, 2020. MEDICATIONS: Heparin 3,000 units IV. Vancomycin 1 g IV antibiotic was administered within 1 hour of the procedure. ANESTHESIA/SEDATION: General anesthesia. CONTRAST:  Omnipaque 240, 150 mL. FLUOROSCOPY TIME:  Fluoroscopy Time: 23 minutes 0 seconds (1203 mGy). COMPLICATIONS: None immediate. TECHNIQUE: Informed written consent was obtained from the patient after a thorough discussion of the procedural risks, benefits and alternatives. All questions were addressed. Maximal Sterile Barrier Technique was utilized including caps, mask, sterile gowns, sterile gloves, sterile drape, hand hygiene and skin antiseptic. A timeout was performed prior to the initiation of the procedure. The Frank groin was prepped and draped in the usual sterile fashion. Thereafter using modified Seldinger technique, transfemoral access into the Frank common femoral artery was obtained without difficulty. Over a 0.035 inch guidewire, an 8 [place] Pinnacle 25 cm sheath was inserted. Through this, and also over 0.035 inch guidewire, a 5 [place] JB 1 catheter  was advanced to the aortic arch region and selectively positioned in the Frank common carotid artery, the left common carotid artery and the innominate artery. FINDINGS: The left common carotid arteriogram demonstrates modest diffuse atherosclerotic irregularity of the left common carotid artery to the bifurcation. The left external carotid artery origin and branches are widely patent. The left internal carotid artery at the bulb demonstrates a heterogeneous contrast density at the carotid bifurcation extending into the left carotid bulb without evidence of significant hemodynamic stenosis. Distal to this the left internal carotid artery opacifies normally to the cranial skull base. The petrous, cavernous and the supraclinoid segments are widely patent. Left middle cerebral artery opacifies into the capillary and venous phases. The left anterior cerebral artery opacifies into the  capillary and venous phases. There is prompt cross filling via the anterior communicating artery into the Frank anterior cerebral A2 segment and distally with mixing of unopacified blood from the Frank internal carotid artery. 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 It measures 4.3 mm x 3.4 mm. Transient opacification of the left posterior communicating artery is seen. The innominate arteriogram demonstrates patency of the Frank common carotid artery proximally, and the Frank subclavian artery. There is mild stenosis at the origin of the Frank vertebral artery. More distally the vessel is seen to opacify to the cranial skull base. The lateral images demonstrate opacification of the left vertebrobasilar junction and the left posterior-inferior cerebellar artery. Suggestion of a moderate stenosis of the Frank vertebrobasilar junction just distal to the origin of the Frank posterior-inferior cerebellar artery. Frank common carotid arteriogram  demonstrates mild atherosclerotic irregularity of the Frank common carotid artery in its mid segment. The Frank common carotid bifurcation demonstrates a complex atherosclerotic plaque with proximally 50% stenosis at the origin of the Frank external carotid artery. Its branches opacify normally. The Frank internal carotid artery proximally and just distally demonstrates a complex heterogeneous opacification of the bulb with 2 areas of tubular stenosis, one at the distal end of the bulb and one at the proximal aspect of the bulb. These represent focal areas of stenosis of approximately 70%. Distal to this the Frank internal carotid artery opacifies widely to the cranial skull base. The petrous, cavernous and supraclinoid segments are widely patent. The Frank middle cerebral artery opacifies into the capillary and venous phases. Previously positioned stent in the M2 segment of the inferior division is widely patent without evidence of intra stent stenosis. The Frank anterior cerebral artery opacifies proximally and distally with mixing of unopacified blood from the contralateral left A1 segment. ENDOVASCULAR REVASCULARIZATION OF PROXIMAL Frank INTERNAL CAROTID ARTERY STENOSIS WITH STENT ASSISTED ANGIOPLASTY AND DISTAL PROTECTION. The diagnostic JB 1 catheter in the Frank common carotid artery was exchanged over a 0.035 inch 300 cm Rosen exchange guidewire for an 087 balloon guide catheter which had been prepped with 50% contrast and 50% heparinized saline infusion. Good aspiration was obtained from the hub of the balloon guide catheter. A control arteriogram was then performed extracranially and intracranially. Measurements were then performed of the Frank internal carotid artery and the Frank common carotid artery at the distal and the proximal landing zones proximally. A 4 6/5 mm Emboshield NAV distal protection device was then prepped and purged with heparinized saline infusion in its housing. The air was cleared  from within the protection device and the delivery microcatheter. The Emboshield was then captured into the delivery microcatheter. The combination of the 014 inch micro guidewire, and the delivery microcatheter was then retrieved. A moderate J configuration was then given to the distal end of the micro guidewire. Using biplane roadmap technique and constant fluoroscopic guidance in a coaxial manner and with constant heparinized saline infusion, the delivery microcatheter of the protection device was then advanced to just proximal to the Frank common carotid bifurcation. Using a torque device, the micro guidewire was then gently advanced without difficulty through the complex plaque and into the petrous segment with the distal protection device. The Emboshield protection device was then deployed in the usual manner. The delivery microcatheter was retrieved and removed under constant observation. A gentle control arteriogram performed through balloon guide catheter in the Frank common carotid artery demonstrated no change in  the extracranial or intracranial circulations. A 4 mm x 30 mm Viatrac 14 angioplasty catheter was then prepped and purged with heparinized saline infusion retrogradely, and 50% contrast and 50% heparinized saline infusion antegradely. Thereafter using rapid exchange technique, the balloon catheter was advanced without difficulty and positioned distally and proximally across the stenosis in the Frank internal carotid artery. A control inflation was then performed using micro inflation syringe device via micro tubing to normal diameter of 4 mm where it was maintained for approximately 30 seconds. The balloon was then deflated and retrieved and removed with the distal protection device maintained in a stable position. Control arteriogram performed through the balloon guide in the common carotid artery demonstrated improved caliber and flow through the angioplastied segment and distally. Measurements  were then performed of the proximal landing zone of the stent in the Frank common carotid artery, and distally in the Frank internal carotid artery. It was elected to proceed with placement of an 8/6 mm x 40 mm Xact stent. The delivery apparatus of the stent was then purged with heparinized saline infusion. Thereafter using the rapid exchange technique, the stent delivery apparatus was then advanced to the Frank internal carotid artery proximally. The proximal and distal landing zones were then identified. The stent was then deployed in the usual manner without any difficulty. The delivery apparatus was removed. A control arteriogram performed through the balloon guide in the Frank common carotid artery demonstrates significantly improved caliber and flow through the Frank internal carotid artery extra cranially and intracranially. Given the waist, it was elected to proceed with a post stent angioplasty. A 5 mm x 30 mm Viatrac 14 balloon catheter was prepped in the usual manner as described above. Using the rapid exchange technique, this was then advanced whereby the proximal and distal markers were positioned adequate distant from the waist. Again a control inflation was performed using micro inflation syringe device via micro tubing to normal atmosphere attaining a 5 mm diameter inflation. This was maintained for approximately 20 seconds. Thereafter the balloon was deflated and retrieved and removed. A control arteriogram performed through the Frank common carotid artery demonstrated straightening of the waist. The filter capture device was then advanced under constant fluoroscopic guidance to the proximal marker of the filter device. This was then captured by retrieving the wire with the filter into the capture device. The combination was then removed without any difficulty. Control arteriograms were then performed via the balloon guide at 15 and 30 minutes post stent dilatation. These continued to demonstrate  excellent flow through the proximal Frank internal carotid artery and intracranially without evidence of intraluminal filling defects or of occlusions. During this time the patient was also given total of 4.5 mg of intra-arterial Integrilin in order to prevent platelet aggregation within the stent at the site of the complex atherosclerotic plaque. A final control arteriogram performed through the balloon guide in the Frank common carotid artery continued to demonstrate excellent flow without intravascular filling defects. The balloon guide was then removed. The 8 French Pinnacle sheath was removed with hemostasis achieved with an 8 [place] Angio-Seal closure device. Distal pulses remained palpable. A CT of the brain performed Frank after demonstrated no evidence of intracranial hemorrhage. 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 Patient was then extubated. Patient recovered slowly from the general anesthesia being able to move all his fours spontaneously and to command. He denied any headaches, nausea or vomiting. He was then transferred to the PACU and then  neuro ICU for post revascularization management. Later that evening, the patient was at his preprocedural neurological status. He denied any headaches, nausea or vomiting or visual symptoms. Motor wise he remained stable with minimal pronation drift off the left outstretched arm. His IV heparin was discontinued the following morning. The patient was switched to Brilinta 190 mg p.o. for followed by 45 mg b.i.d. The Frank groin appeared soft. Distal pulses were palpable. Neurologically, the patient remained unchanged intact. Patient was advised to refrain from stooping, bending or lifting weights above 10 pounds for 2 weeks. Patient also advised to refrain from driving and to maintain adequate hydration. 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 PLAN: Follow-up in the clinic 2 weeks post discharge. Platelet inhibition test will also be performed today. Electronically Signed   By: Luanne Bras M.D.   On: 04/01/2021 09:51   IR ANGIO VERTEBRAL SEL SUBCLAVIAN INNOMINATE UNI R MOD SED  Result Date: 04/02/2021 CLINICAL DATA:  Symptomatic proximal Frank [name] stenosis secondary to complex atherosclerotic plaque. EXAM: IR CT HEAD LIMITED; INTRAVSC STENT CERV CAROTID W/ EMB-PROT COMPARISON:  CT angiogram of the head and neck of Nov 18, 2020, and diagnostic catheter arteriogram of September 09, 2020. MEDICATIONS: Heparin 3,000 units IV. Vancomycin 1 g IV antibiotic was administered within 1 hour of the procedure. ANESTHESIA/SEDATION: General anesthesia. CONTRAST:  Omnipaque 240, 150 mL. FLUOROSCOPY TIME:  Fluoroscopy Time: 23 minutes 0 seconds (1203 mGy). COMPLICATIONS: None immediate. TECHNIQUE: Informed written consent was obtained from the patient after a thorough discussion of the procedural risks, benefits and alternatives. All questions were addressed. Maximal Sterile Barrier Technique was utilized including caps, mask, sterile gowns, sterile gloves, sterile drape, hand hygiene and skin antiseptic. A timeout was performed prior to the initiation of the procedure. The Frank groin was prepped and draped in the usual sterile fashion. Thereafter using modified Seldinger technique, transfemoral access into the Frank common femoral artery was obtained without difficulty. Over a 0.035 inch guidewire, an 8 [place] Pinnacle 25 cm sheath was inserted. Through this, and also over 0.035 inch guidewire, a 5 [place] JB 1 catheter was advanced to the aortic arch region and selectively positioned in the Frank common carotid artery, the left common carotid artery and the innominate artery. FINDINGS: The left common carotid arteriogram demonstrates modest diffuse atherosclerotic  irregularity of the left common carotid artery to the bifurcation. The left external carotid artery origin and branches are widely patent. The left internal carotid artery at the bulb demonstrates a heterogeneous contrast density at the carotid bifurcation extending into the left carotid bulb without evidence of significant hemodynamic stenosis. Distal to this the left internal carotid artery opacifies normally to the cranial skull base. The petrous, cavernous and the supraclinoid segments are widely patent. Left middle cerebral artery opacifies into the capillary and venous phases. The left anterior cerebral artery opacifies into the capillary and venous phases. There is prompt cross filling via the anterior communicating artery into the Frank anterior cerebral A2 segment and distally with mixing of unopacified blood from the Frank internal carotid artery. 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 It measures 4.3 mm x 3.4 mm. Transient opacification of the left posterior communicating artery is seen. The innominate arteriogram demonstrates patency of the Frank common carotid artery proximally, and the Frank subclavian artery. There is mild stenosis  at the origin of the Frank vertebral artery. More distally the vessel is seen to opacify to the cranial skull base. The lateral images demonstrate opacification of the left vertebrobasilar junction and the left posterior-inferior cerebellar artery. Suggestion of a moderate stenosis of the Frank vertebrobasilar junction just distal to the origin of the Frank posterior-inferior cerebellar artery. Frank common carotid arteriogram demonstrates mild atherosclerotic irregularity of the Frank common carotid artery in its mid segment. The Frank common carotid bifurcation demonstrates a complex atherosclerotic plaque with proximally 50% stenosis at the origin of the Frank external  carotid artery. Its branches opacify normally. The Frank internal carotid artery proximally and just distally demonstrates a complex heterogeneous opacification of the bulb with 2 areas of tubular stenosis, one at the distal end of the bulb and one at the proximal aspect of the bulb. These represent focal areas of stenosis of approximately 70%. Distal to this the Frank internal carotid artery opacifies widely to the cranial skull base. The petrous, cavernous and supraclinoid segments are widely patent. The Frank middle cerebral artery opacifies into the capillary and venous phases. Previously positioned stent in the M2 segment of the inferior division is widely patent without evidence of intra stent stenosis. The Frank anterior cerebral artery opacifies proximally and distally with mixing of unopacified blood from the contralateral left A1 segment. ENDOVASCULAR REVASCULARIZATION OF PROXIMAL Frank INTERNAL CAROTID ARTERY STENOSIS WITH STENT ASSISTED ANGIOPLASTY AND DISTAL PROTECTION. The diagnostic JB 1 catheter in the Frank common carotid artery was exchanged over a 0.035 inch 300 cm Rosen exchange guidewire for an 087 balloon guide catheter which had been prepped with 50% contrast and 50% heparinized saline infusion. Good aspiration was obtained from the hub of the balloon guide catheter. A control arteriogram was then performed extracranially and intracranially. Measurements were then performed of the Frank internal carotid artery and the Frank common carotid artery at the distal and the proximal landing zones proximally. A 4 6/5 mm Emboshield NAV distal protection device was then prepped and purged with heparinized saline infusion in its housing. The air was cleared from within the protection device and the delivery microcatheter. The Emboshield was then captured into the delivery microcatheter. The combination of the 014 inch micro guidewire, and the delivery microcatheter was then retrieved. A moderate J  configuration was then given to the distal end of the micro guidewire. Using biplane roadmap technique and constant fluoroscopic guidance in a coaxial manner and with constant heparinized saline infusion, the delivery microcatheter of the protection device was then advanced to just proximal to the Frank common carotid bifurcation. Using a torque device, the micro guidewire was then gently advanced without difficulty through the complex plaque and into the petrous segment with the distal protection device. The Emboshield protection device was then deployed in the usual manner. The delivery microcatheter was retrieved and removed under constant observation. A gentle control arteriogram performed through balloon guide catheter in the Frank common carotid artery demonstrated no change in the extracranial or intracranial circulations. A 4 mm x 30 mm Viatrac 14 angioplasty catheter was then prepped and purged with heparinized saline infusion retrogradely, and 50% contrast and 50% heparinized saline infusion antegradely. Thereafter using rapid exchange technique, the balloon catheter was advanced without difficulty and positioned distally and proximally across the stenosis in the Frank internal carotid artery. A control inflation was then performed using micro inflation syringe device via micro tubing to normal diameter of 4 mm where it was maintained for approximately 30 seconds. The balloon was  then deflated and retrieved and removed with the distal protection device maintained in a stable position. Control arteriogram performed through the balloon guide in the common carotid artery demonstrated improved caliber and flow through the angioplastied segment and distally. Measurements were then performed of the proximal landing zone of the stent in the Frank common carotid artery, and distally in the Frank internal carotid artery. It was elected to proceed with placement of an 8/6 mm x 40 mm Xact stent. The delivery  apparatus of the stent was then purged with heparinized saline infusion. Thereafter using the rapid exchange technique, the stent delivery apparatus was then advanced to the Frank internal carotid artery proximally. The proximal and distal landing zones were then identified. The stent was then deployed in the usual manner without any difficulty. The delivery apparatus was removed. A control arteriogram performed through the balloon guide in the Frank common carotid artery demonstrates significantly improved caliber and flow through the Frank internal carotid artery extra cranially and intracranially. Given the waist, it was elected to proceed with a post stent angioplasty. A 5 mm x 30 mm Viatrac 14 balloon catheter was prepped in the usual manner as described above. Using the rapid exchange technique, this was then advanced whereby the proximal and distal markers were positioned adequate distant from the waist. Again a control inflation was performed using micro inflation syringe device via micro tubing to normal atmosphere attaining a 5 mm diameter inflation. This was maintained for approximately 20 seconds. Thereafter the balloon was deflated and retrieved and removed. A control arteriogram performed through the Frank common carotid artery demonstrated straightening of the waist. The filter capture device was then advanced under constant fluoroscopic guidance to the proximal marker of the filter device. This was then captured by retrieving the wire with the filter into the capture device. The combination was then removed without any difficulty. Control arteriograms were then performed via the balloon guide at 15 and 30 minutes post stent dilatation. These continued to demonstrate excellent flow through the proximal Frank internal carotid artery and intracranially without evidence of intraluminal filling defects or of occlusions. During this time the patient was also given total of 4.5 mg of intra-arterial  Integrilin in order to prevent platelet aggregation within the stent at the site of the complex atherosclerotic plaque. A final control arteriogram performed through the balloon guide in the Frank common carotid artery continued to demonstrate excellent flow without intravascular filling defects. The balloon guide was then removed. The 8 French Pinnacle sheath was removed with hemostasis achieved with an 8 [place] Angio-Seal closure device. Distal pulses remained palpable. A CT of the brain performed Frank after demonstrated no evidence of intracranial hemorrhage. 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 Patient was then extubated. Patient recovered slowly from the general anesthesia being able to move all his fours spontaneously and to command. He denied any headaches, nausea or vomiting. He was then transferred to the PACU and then neuro ICU for post revascularization management. Later that evening, the patient was at his preprocedural neurological status. He denied any headaches, nausea or vomiting or visual symptoms. Motor wise he remained stable with minimal pronation drift off the left outstretched arm. His IV heparin was discontinued the following morning. The patient was switched to Brilinta 190 mg p.o. for followed by 45 mg b.i.d. The Frank groin appeared soft. Distal pulses were palpable. Neurologically, the patient remained unchanged intact. Patient was advised to refrain from stooping, bending or lifting weights above  10 pounds for 2 weeks. Patient also advised to refrain from driving and to maintain adequate hydration. 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 PLAN: Follow-up in the clinic 2 weeks post discharge. Platelet inhibition test will also be performed today. Electronically Signed   By:  Luanne Bras M.D.   On: 04/01/2021 09:51    Microbiology: Recent Results (from the past 240 hour(s))  Blood culture (routine x 2)     Status: None   Collection Time: 03/26/21  2:32 PM   Specimen: BLOOD  Result Value Ref Range Status   Specimen Description BLOOD LEFT ANTECUBITAL  Final   Special Requests   Final    BOTTLES DRAWN AEROBIC AND ANAEROBIC Blood Culture adequate volume   Culture   Final    NO GROWTH 5 DAYS Performed at [facility], [address], [place], [place] [address]    Report Status 03/31/2021 FINAL  Final  Blood culture (routine x 2)     Status: None   Collection Time: 03/26/21  4:37 PM   Specimen: BLOOD Frank FOREARM  Result Value Ref Range Status   Specimen Description BLOOD Frank FOREARM  Final   Special Requests   Final    BOTTLES DRAWN AEROBIC AND ANAEROBIC Blood Culture results may not be optimal due to an inadequate volume of blood received in culture bottles   Culture   Final    NO GROWTH 5 DAYS Performed at [facility], [place] [address], [place], [place] [address]    Report Status 03/31/2021 FINAL  Final  Resp Panel by Frank-PCR (Flu A&B, Covid) Nasopharyngeal Swab     Status: None   Collection Time: 03/26/21  7:30 PM   Specimen: Nasopharyngeal Swab; Nasopharyngeal(NP) swabs in vial transport medium  Result Value Ref Range Status   SARS Coronavirus 2 by Frank PCR NEGATIVE NEGATIVE Final    Comment: (NOTE) SARS-CoV-2 target nucleic acids are NOT DETECTED.  The SARS-CoV-2 RNA is generally detectable in upper respiratory specimens during the acute phase of infection. The lowest concentration of SARS-CoV-2 viral copies this assay can detect is 138 copies/mL. A negative result does not preclude SARS-Cov-2 infection and should not be used as the sole basis for treatment or other patient management decisions. A negative result may occur with  improper specimen collection/handling, submission of specimen other than nasopharyngeal swab,  presence of viral mutation(s) within the areas targeted by this assay, and inadequate number of viral copies(<138 copies/mL). A negative result must be combined with clinical observations, patient history, and epidemiological information. The expected result is Negative.  Fact Sheet for Patients:  EntrepreneurPulse.com.au  Fact Sheet for Healthcare Providers:  IncredibleEmployment.be  This test is no t yet approved or cleared by the [place] FDA and  has been authorized for detection and/or diagnosis of SARS-CoV-2 by FDA under an Emergency Use Authorization (EUA). This EUA will remain  in effect (meaning this test can be used) for the duration of the COVID-19 declaration under Section 564(b)(1) of the Act, 21 U.S.C.section 360bbb-3(b)(1), unless the authorization is terminated  or revoked sooner.       Influenza A by PCR NEGATIVE NEGATIVE Final   Influenza B by PCR NEGATIVE NEGATIVE Final    Comment: (NOTE) The Xpert Xpress SARS-CoV-2/FLU/RSV plus assay is intended as an aid in the diagnosis of influenza from Nasopharyngeal swab specimens and should not be used  as a sole basis for treatment. Nasal washings and aspirates are unacceptable for Xpert Xpress SARS-CoV-2/FLU/RSV testing.  Fact Sheet for Patients: EntrepreneurPulse.com.au  Fact Sheet for Healthcare Providers: IncredibleEmployment.be  This test is not yet approved or cleared by the [place] FDA and has been authorized for detection and/or diagnosis of SARS-CoV-2 by FDA under an Emergency Use Authorization (EUA). This EUA will remain in effect (meaning this test can be used) for the duration of the COVID-19 declaration under Section 564(b)(1) of the Act, 21 U.S.C. section 360bbb-3(b)(1), unless the authorization is terminated or revoked.  Performed at [facility], [place] [address] [place], [place] [address]   Surgical pcr  screen     Status: None   Collection Time: 03/30/21  6:26 PM   Specimen: Nasal Mucosa; Nasal Swab  Result Value Ref Range Status   MRSA, PCR NEGATIVE NEGATIVE Final   Staphylococcus aureus NEGATIVE NEGATIVE Final    Comment: (NOTE) The Xpert SA Assay (FDA approved for NASAL specimens in patients 53 years of age and older), is one component of a comprehensive surveillance program. It is not intended to diagnose infection nor to guide or monitor treatment. Performed at [facility], [place] [address], [place], [place] [address]   MRSA Next Gen by PCR, Nasal     Status: None   Collection Time: 03/31/21 12:12 PM   Specimen: Nasal Mucosa; Nasal Swab  Result Value Ref Range Status   MRSA by PCR Next Gen NOT DETECTED NOT DETECTED Final    Comment: (NOTE) The GeneXpert MRSA Assay (FDA approved for NASAL specimens only), is one component of a comprehensive MRSA colonization surveillance program. It is not intended to diagnose MRSA infection nor to guide or monitor treatment for MRSA infections. Test performance is not FDA approved in patients less than 25 years old. Performed at [facility], [place] [address], [place], [place] [address]   SARS CORONAVIRUS 2 (TAT 6-24 HRS) Nasopharyngeal Nasopharyngeal Swab     Status: None   Collection Time: 04/01/21  5:55 PM   Specimen: Nasopharyngeal Swab  Result Value Ref Range Status   SARS Coronavirus 2 NEGATIVE NEGATIVE Final    Comment: (NOTE) SARS-CoV-2 target nucleic acids are NOT DETECTED.  The SARS-CoV-2 RNA is generally detectable in upper and lower respiratory specimens during the acute phase of infection. Negative results do not preclude SARS-CoV-2 infection, do not rule out co-infections with other pathogens, and should not be used as the sole basis for treatment or other patient management decisions. Negative results must be combined with clinical observations, patient history, and epidemiological information. The  expected result is Negative.  Fact Sheet for Patients: SugarRoll.be  Fact Sheet for Healthcare Providers: https://www.woods-mathews.com/  This test is not yet approved or cleared by the [place] FDA and  has been authorized for detection and/or diagnosis of SARS-CoV-2 by FDA under an Emergency Use Authorization (EUA). This EUA will remain  in effect (meaning this test can be used) for the duration of the COVID-19 declaration under Se ction 564(b)(1) of the Act, 21 U.S.C. section 360bbb-3(b)(1), unless the authorization is terminated or revoked sooner.  Performed at [facility], [place] [address] [place], [place] [address]      Labs: Basic Metabolic Panel: Recent Labs  Lab 03/29/21 0141 03/30/21 1645 03/31/21 0136 04/01/21 0445 04/03/21 0311  NA 134* 135 135 135 135  K 3.7 4.5 3.8 3.9 3.9  CL 97* 98 99 101 100  CO2 27  27 25 25 28   GLUCOSE 177* 165* 152* 109* 160*  BUN 26* 30* 31* 22 24*  CREATININE 1.31* 1.72* 1.75* 1.21 1.41*  CALCIUM 9.0 9.2 8.5* 8.5* 8.8*   Liver Function Tests: Recent Labs  Lab 03/31/21 0136  AST 15  ALT 18  ALKPHOS 77  BILITOT 0.5  PROT 5.2*  ALBUMIN 2.4*   No results for input(s): LIPASE, AMYLASE in the last 168 hours. No results for input(s): AMMONIA in the last 168 hours. CBC: Recent Labs  Lab 03/29/21 0141 03/30/21 1645 03/31/21 0136 04/01/21 0445  WBC 6.5 8.1 7.2 7.4  NEUTROABS 3.8 5.5 3.9 5.6  HGB 11.2* 12.2* 9.8* 8.8*  HCT 33.9* 37.8* 30.4* 27.4*  MCV 83.3 84.9 84.9 85.4  PLT 166 199 158 165   Cardiac Enzymes: No results for input(s): CKTOTAL, CKMB, CKMBINDEX, TROPONINI in the last 168 hours. BNP: BNP (last 3 results) Recent Labs    05/14/20 1329  BNP 393.4*    ProBNP (last 3 results) No results for input(s): PROBNP in the last 8760 hours.  CBG: Recent Labs  Lab 04/02/21 1227 04/02/21 1648 04/02/21 2113 04/03/21 0736 04/03/21 1147  GLUCAP 143* 138* 163*  141* 131*    Principal Problem:   Infection of thumb Active Problems:   Hyperlipidemia with target LDL less than 100   Obstructive sleep apnea   Essential hypertension   Type 2 diabetes mellitus with peripheral neuropathy ([name])   Chronic renal disease, stage 3, moderately decreased glomerular filtration rate (GFR) between 30-59 mL/min/1.73 square meter ([name])   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 ([name])   Generalized weakness   Carotid stenosis, symptomatic, with infarction [facility])   Time coordinating discharge: 38 minutes  Signed:        Braelen Sproule, DO [facility]  04/03/2021, 1:15 PM

## 2021-04-03 NOTE — TOC Transition Note (Signed)
Transition of Care [facility]) - CM/SW Discharge Note   Patient Details  Name: Aneesh Faller MRN: [id number] Date of Birth: 1940-10-27  Transition of Care [facility]) CM/SW Contact:  Benard Halsted, LCSW Phone Number: 04/03/2021, 1:13 PM   Clinical Narrative:    Patient will DC to: [facility] Anticipated DC date: 04/03/21  Family notified: Spouse Transport by: Corey Harold   Per MD patient ready for DC to [facility]. RN to call report prior to discharge [phone]). RN, patient, patient's family, and facility notified of DC. Discharge Summary and FL2 sent to facility. DC packet on chart. Ambulance transport requested for patient.   CSW will sign off for now as social work intervention is no longer needed. Please consult [place] again if new needs arise.     Final next level of care: Skilled Nursing Facility Barriers to Discharge: Barriers Resolved   Patient Goals and CMS Choice Patient states their goals for this hospitalization and ongoing recovery are:: "back to what I was" CMS Medicare.gov Compare Post Acute Care list provided to:: Patient Choice offered to / list presented to : Patient  Discharge Placement   Existing PASRR number confirmed : 04/03/21          Patient chooses bed at: [place] Patient to be transferred to facility by: [place] Name of family member notified: Spouse Patient and family notified of of transfer: 04/03/21  Discharge Plan and Services     Post Acute Care Choice: [place] (only if [facility] or [facility])                               Social Determinants of Health (SDOH) Interventions     Readmission Risk Interventions No flowsheet data found.

## 2021-04-03 NOTE — Progress Notes (Signed)
PT Cancellation Note  Patient Details Name: Frank [name] MRN: [id number] DOB: 1940-12-20   Cancelled Treatment:    Reason Eval/Treat Not Completed: Other (comment), deferred due to wife reporting transport to [facility] planned this pm.  Doesn't want his BP to drop.  Will attempt again if not d/c.    Reginia Naas 04/03/2021, 4:34 PM Magda Kiel, PT Acute Rehabilitation Services KXFGH:[phone] Office:[phone] 04/03/2021

## 2021-04-06 DIAGNOSIS — R296 Repeated falls: Secondary | ICD-10-CM | POA: Diagnosis not present

## 2021-04-06 DIAGNOSIS — R339 Retention of urine, unspecified: Secondary | ICD-10-CM | POA: Diagnosis not present

## 2021-04-06 DIAGNOSIS — I6521 Occlusion and stenosis of right carotid artery: Secondary | ICD-10-CM | POA: Diagnosis not present

## 2021-04-07 NOTE — Telephone Encounter (Signed)
Called to schedule 2 week f/u, no answer, left vm. AW

## 2021-04-10 LAB — PLATELET INHIBITION P2Y12

## 2021-04-17 ENCOUNTER — Other Ambulatory Visit: Payer: Self-pay

## 2021-04-17 ENCOUNTER — Ambulatory Visit (INDEPENDENT_AMBULATORY_CARE_PROVIDER_SITE_OTHER): Payer: Medicare Other | Admitting: Internal Medicine

## 2021-04-17 VITALS — BP 110/60 | HR 84 | Temp 98.1°F | Ht 69.0 in

## 2021-04-17 DIAGNOSIS — I63239 Cerebral infarction due to unspecified occlusion or stenosis of unspecified carotid arteries: Secondary | ICD-10-CM | POA: Diagnosis not present

## 2021-04-17 DIAGNOSIS — I10 Essential (primary) hypertension: Secondary | ICD-10-CM

## 2021-04-17 DIAGNOSIS — F0280 Dementia in other diseases classified elsewhere without behavioral disturbance: Secondary | ICD-10-CM

## 2021-04-17 DIAGNOSIS — D510 Vitamin B12 deficiency anemia due to intrinsic factor deficiency: Secondary | ICD-10-CM | POA: Diagnosis not present

## 2021-04-17 DIAGNOSIS — E1142 Type 2 diabetes mellitus with diabetic polyneuropathy: Secondary | ICD-10-CM

## 2021-04-17 NOTE — Progress Notes (Signed)
Patient ID: Frank [name], male   DOB: October 07, 1940, 80 y.o.   MRN: [id number]        Chief Complaint: follow up post hospn and rehab       HPI:  Frank [name] is a 80 y.o. male here with wife overall doing well but with multiple significant comorbids for which they both are accepting of; due for b12 injeciton.  Has lost overall 20 lbs in the past yr;  left thumb infection completely resolved and significant nail loss seen.  With most recent hospn - Hand surgery evaluated films of the left hand. He did not feel that antibiotics were warranted. CT head again demonstrated Right MCA stent. The patient was again found to have complicated and symptomatic Right ICA stenosis. Dr. Estanislado Pandy was consulted and the patient underwent RT VA and bilateral common carotid artreriograms followed by revascularization of prox Rt ICA stenosis associated with a complex atherosclerotic plaque with Stent assisted angioplasty and distal protection.He tolerated the procedure well. Blood pressures have been well controlled. He was started on DAPT with ASA and Brillinta. The patient was evaluated by PT/OT. They have recommended inpatient rehab.  Pt remains with stable dementia, DNR status. Pt remain with urinary catheter, and f/u with urology PA soon.  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, Ischemic optic   OSA (obstructive sleep apnea)    cpap   Presence of permanent cardiac pacemaker     TBI (traumatic brain injury)    Past Surgical History:  Procedure Laterality Date   APPENDECTOMY     arm fracture Left 2011   with hardware   BURR HOLE Bilateral 08/04/2017   Procedure: Haskell Flirt;  Surgeon: [name], MD;  Location: [place];  Service: Neurosurgery;  Laterality: Bilateral;   COLONOSCOPY     CRANIOTOMY Left 12/01/2017   Procedure: Trudee Kuster Holes CRANIOTOMY HEMATOMA EVACUATION SUBDURAL;  Surgeon: [name], MD;  Location: [place];  Service: Neurosurgery;  Laterality: Left;   ESOPHAGOGASTRODUODENOSCOPY  2006   gastritis   IR ANGIO INTRA EXTRACRAN SEL COM CAROTID INNOMINATE UNI L MOD SED  03/31/2021   IR ANGIO VERTEBRAL SEL SUBCLAVIAN INNOMINATE UNI R MOD SED  03/31/2021   IR CT HEAD LTD  03/31/2021   IR INTRAVSC STENT CERV CAROTID W/EMB-PROT MOD SED INCL ANGIO  03/31/2021   IR PERCUTANEOUS ART THROMBECTOMY/INFUSION INTRACRANIAL INC DIAG ANGIO  09/09/2020   IR RADIOLOGIST EVAL & MGMT  03/10/2021   PACEMAKER IMPLANT N/A 05/14/2020   Procedure: PACEMAKER IMPLANT;  Surgeon: [name], MD;  Location: [place] [facility];  Service: Cardiovascular;  Laterality: N/A;   RADIOLOGY WITH ANESTHESIA N/A 09/09/2020   Procedure: IR WITH ANESTHESIA;  Surgeon: Radiologist, Medication, MD;  Location: [place];  Service: Radiology;  Laterality: N/A;  RADIOLOGY WITH ANESTHESIA N/A 03/31/2021   Procedure: IR WITH ANESTHESIA STENT PLACEMENT;  Surgeon: Luanne Bras, MD;  Location: [place];  Service: Radiology;  Laterality: N/A;   ROTATOR CUFF REPAIR     SEPTOPLASTY  1959   Deviated Septum   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 His smoking use included cigarettes. He smoked an average of 1 pack per day. He has never used smokeless tobacco. He reports current alcohol use of about 2.0 standard drinks per week. He reports that he does not use drugs. family history includes Allergies in his father; Breast cancer in his mother; Coronary artery disease in his  brother; Heart disease in his mother; Stroke in his father. Allergies  Allergen Reactions   Lisinopril Cough   Invokana [Canagliflozin] Other (See Comments)    Stopped by MD, unknown reaction    Penicillins Other (See Comments)    Allergic as a child.  Patient does not remember reaction.  Has had cephalasporins without problems.   Sulfamethoxazole Other (See Comments)    Unknown reaction- from childhood   Current Outpatient Medications on File Prior to Visit  Medication Sig Dispense Refill   acetaminophen (TYLENOL) 500 MG tablet Take 500-1,000 mg by mouth every 6 (six) hours as needed for moderate pain or headache.     amLODipine (NORVASC) 10 MG tablet Take 1 tablet (10 mg total) by mouth daily. (Patient taking differently: Take 10 mg by mouth at bedtime.)     aspirin 81 MG chewable tablet Chew 1 tablet (81 mg total) by mouth daily.     atorvastatin (LIPITOR) 10 MG tablet Take 1 tablet (10 mg total) by mouth daily.     docusate sodium (COLACE) 100 MG capsule Take 1 capsule (100 mg total) by mouth 2 (two) times daily. 10 capsule 0   Ferric Maltol (ACCRUFER) 30 MG CAPS Take 1 capsule by mouth in the morning and at bedtime. (Patient taking differently: Take 30 mg by mouth in the morning and at bedtime.) 180 capsule 1   FLUoxetine (PROZAC) 20 MG capsule TAKE 1 CAPSULE BY MOUTH  DAILY (Patient taking differently: Take 20 mg by mouth daily.) 90 capsule 1   fluticasone (FLONASE) 50 MCG/ACT nasal spray Place 1-2 sprays into both nostrils daily as needed for allergies or rhinitis.     glipiZIDE (GLUCOTROL XL) 10 MG 24 hr tablet TAKE 1 TABLET BY MOUTH  DAILY 90 tablet 0   insulin glargine, 2 Unit Dial, (TOUJEO MAX SOLOSTAR) 300 UNIT/ML Solostar Pen Inject 10 Units into the skin daily. 6 mL 1   Insulin Pen Needle 32G X 6 MM MISC 1 Act by Does not apply route daily. 100 each 1   levETIRAcetam (KEPPRA) 500 MG tablet Take 1 tablet (500 mg total) by mouth 2 (two) times daily. 180 tablet 1   pantoprazole  (PROTONIX) 40 MG tablet TAKE 1 TABLET BY MOUTH  DAILY (Patient taking differently: Take 40 mg by mouth daily.) 90 tablet 1   PRESCRIPTION MEDICATION CPAP- At bedtime     solifenacin (VESICARE) 10 MG tablet TAKE 1 TABLET BY MOUTH  DAILY (Patient taking differently: Take 10 mg by mouth daily.) 90 tablet 1   tamsulosin (FLOMAX) 0.4 MG CAPS capsule Take 1 capsule (0.4 mg total) by mouth daily after supper. 30 capsule 0   ticagrelor (BRILINTA) 90 MG TABS tablet Take 0.5 tablets (45 mg total) by mouth 2 (two) times daily. 180 tablet 0  No current facility-administered medications on file prior to visit.        ROS:  All others reviewed and negative.  Objective        PE:  BP 110/60 (BP Location: Right Arm, Patient Position: Sitting, Cuff Size: Normal)   Pulse 84   Temp 98.1 F (36.7 C) (Oral)   Ht 5\' 9"  (1.753 m)   SpO2 98%   BMI 27.61 kg/m                 Constitutional: Pt appears in NAD               HENT: Head: NCAT.                Right Ear: External ear normal.                 Left Ear: External ear normal.                Eyes: . Pupils are equal, round, and reactive to light. Conjunctivae and EOM are normal               Nose: without d/c or deformity               Neck: Neck supple. Gross normal ROM               Cardiovascular: Normal rate and regular rhythm.                 Pulmonary/Chest: Effort normal and breath sounds without rales or wheezing.                Abd:  Soft, NT, ND, + BS, no organomegaly               Neurological: Pt is alert. At baseline orientation, motor grossly intact               Skin: Skin is warm. No rashes, no other new lesions, LE edema - trace bilateral               Psychiatric: Pt behavior is normal without agitation   Micro: none  Cardiac tracings I have personally interpreted today:  none  Pertinent Radiological findings (summarize): none   Lab Results  Component Value Date   WBC 12.3 (H) 04/19/2021   HGB 10.7 (L) 04/19/2021   HCT  33.9 (L) 04/19/2021   PLT 210 04/19/2021   GLUCOSE 309 (H) 04/19/2021   CHOL 104 11/19/2020   TRIG 91 11/19/2020   HDL 33 (L) 11/19/2020   LDLCALC 53 11/19/2020   ALT 23 04/17/2021   AST 17 04/17/2021   NA 133 (L) 04/19/2021   K 4.3 04/19/2021   CL 98 04/19/2021   CREATININE 1.47 (H) 04/19/2021   BUN 27 (H) 04/19/2021   CO2 25 04/19/2021   TSH 0.459 11/18/2020   PSA 1.08 07/10/2015   INR 0.9 03/30/2021   HGBA1C 7.7 (H) 02/10/2021   MICROALBUR 67.2 04/23/2020   Assessment/Plan:  Frank [name] is a 80 y.o. White or Caucasian [1] male with  has a past medical history of Allergy, Anemia, Arthritis, AV block, Mobitz 2 (05/14/2020), Diabetes mellitus, GERD (gastroesophageal reflux disease), Hyperlipidemia, Hypertension, Neuropathy (2001), OSA (obstructive sleep apnea), Presence of permanent cardiac pacemaker, and TBI (traumatic brain injury).  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 ([place]) Currently stable, Pt is DNR, cont same tx -family support  Essential hypertension BP Readings from Last 3 Encounters:  04/19/21 111/73  04/17/21 110/60  04/03/21 137/66   Stable, pt to continue medical treatment - norvasc   Type 2 diabetes mellitus with peripheral neuropathy ([name]) Lab Results  Component Value Date   HGBA1C 7.7 (H) 02/10/2021   Mild uncontrolled, goal ldl < 7.5, pt to continue current medical treatment glucotrol, toujeo - declines change   Carotid stenosis, symptomatic, with infarction ([place]) Stable, for bmp f/u today  B12 deficiency anemia For b12 injeciton today,  to f/u any worsening symptoms or concerns  Followup: Return in about 3 months (around 07/18/2021).  Cathlean Cower, MD 04/19/2021 6:01 PM [facility]

## 2021-04-17 NOTE — Patient Instructions (Signed)

## 2021-04-18 ENCOUNTER — Encounter: Payer: Self-pay | Admitting: Internal Medicine

## 2021-04-18 LAB — HEPATIC FUNCTION PANEL
ALT: 23 U/L (ref 0–53)
AST: 17 U/L (ref 0–37)
Albumin: 3.5 g/dL (ref 3.5–5.2)
Alkaline Phosphatase: 106 U/L (ref 39–117)
Bilirubin, Direct: 0.1 mg/dL (ref 0.0–0.3)
Total Bilirubin: 0.3 mg/dL (ref 0.2–1.2)
Total Protein: 6.3 g/dL (ref 6.0–8.3)

## 2021-04-18 LAB — CBC WITH DIFFERENTIAL/PLATELET
Basophils Absolute: 0.1 10*3/uL (ref 0.0–0.1)
Basophils Relative: 0.8 % (ref 0.0–3.0)
Eosinophils Absolute: 0.9 10*3/uL — ABNORMAL HIGH (ref 0.0–0.7)
Eosinophils Relative: 8.3 % — ABNORMAL HIGH (ref 0.0–5.0)
HCT: 30.9 % — ABNORMAL LOW (ref 39.0–52.0)
Hemoglobin: 10.1 g/dL — ABNORMAL LOW (ref 13.0–17.0)
Lymphocytes Relative: 14 % (ref 12.0–46.0)
Lymphs Abs: 1.5 10*3/uL (ref 0.7–4.0)
MCHC: 32.7 g/dL (ref 30.0–36.0)
MCV: 82.6 fl (ref 78.0–100.0)
Monocytes Absolute: 1.2 10*3/uL — ABNORMAL HIGH (ref 0.1–1.0)
Monocytes Relative: 11.8 % (ref 3.0–12.0)
Neutro Abs: 6.9 10*3/uL (ref 1.4–7.7)
Neutrophils Relative %: 65.1 % (ref 43.0–77.0)
Platelets: 209 10*3/uL (ref 150.0–400.0)
RBC: 3.74 Mil/uL — ABNORMAL LOW (ref 4.22–5.81)
RDW: 13.9 % (ref 11.5–15.5)
WBC: 10.6 10*3/uL — ABNORMAL HIGH (ref 4.0–10.5)

## 2021-04-18 LAB — BASIC METABOLIC PANEL
BUN: 27 mg/dL — ABNORMAL HIGH (ref 6–23)
CO2: 28 mEq/L (ref 19–32)
Calcium: 8.9 mg/dL (ref 8.4–10.5)
Chloride: 100 mEq/L (ref 96–112)
Creatinine, Ser: 1.41 mg/dL (ref 0.40–1.50)
GFR: 46.39 mL/min — ABNORMAL LOW (ref >60.00)
Glucose, Bld: 235 mg/dL — ABNORMAL HIGH (ref 70–99)
Potassium: 4.2 mEq/L (ref 3.5–5.1)
Sodium: 134 mEq/L — ABNORMAL LOW (ref 135–145)

## 2021-04-19 ENCOUNTER — Other Ambulatory Visit: Payer: Self-pay

## 2021-04-19 ENCOUNTER — Emergency Department (HOSPITAL_COMMUNITY)
Admission: EM | Admit: 2021-04-19 | Discharge: 2021-04-19 | Disposition: A | Discharge status: Home / Self Care | Payer: Medicare Other | Attending: Emergency Medicine | Admitting: Emergency Medicine

## 2021-04-19 ENCOUNTER — Encounter (HOSPITAL_COMMUNITY): Payer: Self-pay

## 2021-04-19 ENCOUNTER — Encounter: Payer: Self-pay | Admitting: Internal Medicine

## 2021-04-19 DIAGNOSIS — L989 Disorder of the skin and subcutaneous tissue, unspecified: Secondary | ICD-10-CM | POA: Insufficient documentation

## 2021-04-19 DIAGNOSIS — Z7984 Long term (current) use of oral hypoglycemic drugs: Secondary | ICD-10-CM | POA: Insufficient documentation

## 2021-04-19 DIAGNOSIS — E1122 Type 2 diabetes mellitus with diabetic chronic kidney disease: Secondary | ICD-10-CM | POA: Diagnosis not present

## 2021-04-19 DIAGNOSIS — R238 Other skin changes: Secondary | ICD-10-CM

## 2021-04-19 DIAGNOSIS — Z794 Long term (current) use of insulin: Secondary | ICD-10-CM | POA: Insufficient documentation

## 2021-04-19 DIAGNOSIS — Z87891 Personal history of nicotine dependence: Secondary | ICD-10-CM | POA: Diagnosis not present

## 2021-04-19 DIAGNOSIS — L089 Local infection of the skin and subcutaneous tissue, unspecified: Secondary | ICD-10-CM | POA: Diagnosis not present

## 2021-04-19 DIAGNOSIS — D72829 Elevated white blood cell count, unspecified: Secondary | ICD-10-CM | POA: Diagnosis not present

## 2021-04-19 DIAGNOSIS — Z7982 Long term (current) use of aspirin: Secondary | ICD-10-CM | POA: Diagnosis not present

## 2021-04-19 DIAGNOSIS — Z79899 Other long term (current) drug therapy: Secondary | ICD-10-CM | POA: Diagnosis not present

## 2021-04-19 DIAGNOSIS — N183 Chronic kidney disease, stage 3 unspecified: Secondary | ICD-10-CM | POA: Diagnosis not present

## 2021-04-19 DIAGNOSIS — I129 Hypertensive chronic kidney disease with stage 1 through stage 4 chronic kidney disease, or unspecified chronic kidney disease: Secondary | ICD-10-CM | POA: Diagnosis not present

## 2021-04-19 DIAGNOSIS — E1140 Type 2 diabetes mellitus with diabetic neuropathy, unspecified: Secondary | ICD-10-CM | POA: Insufficient documentation

## 2021-04-19 DIAGNOSIS — Z950 Presence of cardiac pacemaker: Secondary | ICD-10-CM | POA: Insufficient documentation

## 2021-04-19 LAB — CBC WITH DIFFERENTIAL/PLATELET
Abs Immature Granulocytes: 0.07 10*3/uL (ref 0.00–0.07)
Basophils Absolute: 0.1 10*3/uL (ref 0.0–0.1)
Basophils Relative: 1 %
Eosinophils Absolute: 0.3 10*3/uL (ref 0.0–0.5)
Eosinophils Relative: 3 %
HCT: 33.9 % — ABNORMAL LOW (ref 39.0–52.0)
Hemoglobin: 10.7 g/dL — ABNORMAL LOW (ref 13.0–17.0)
Immature Granulocytes: 1 %
Lymphocytes Relative: 10 %
Lymphs Abs: 1.2 10*3/uL (ref 0.7–4.0)
MCH: 27 pg (ref 26.0–34.0)
MCHC: 31.6 g/dL (ref 30.0–36.0)
MCV: 85.6 fL (ref 80.0–100.0)
Monocytes Absolute: 1.1 10*3/uL — ABNORMAL HIGH (ref 0.1–1.0)
Monocytes Relative: 9 %
Neutro Abs: 9.6 10*3/uL — ABNORMAL HIGH (ref 1.7–7.7)
Neutrophils Relative %: 76 %
Platelets: 210 10*3/uL (ref 150–400)
RBC: 3.96 MIL/uL — ABNORMAL LOW (ref 4.22–5.81)
RDW: 13.5 % (ref 11.5–15.5)
WBC: 12.3 10*3/uL — ABNORMAL HIGH (ref 4.0–10.5)
nRBC: 0 % (ref 0.0–0.2)

## 2021-04-19 LAB — BASIC METABOLIC PANEL
Anion gap: 10 (ref 5–15)
BUN: 27 mg/dL — ABNORMAL HIGH (ref 8–23)
CO2: 25 mmol/L (ref 22–32)
Calcium: 9 mg/dL (ref 8.9–10.3)
Chloride: 98 mmol/L (ref 98–111)
Creatinine, Ser: 1.47 mg/dL — ABNORMAL HIGH (ref 0.61–1.24)
GFR, Estimated: 47 mL/min — ABNORMAL LOW (ref >60)
Glucose, Bld: 309 mg/dL — ABNORMAL HIGH (ref 70–99)
Potassium: 4.3 mmol/L (ref 3.5–5.1)
Sodium: 133 mmol/L — ABNORMAL LOW (ref 135–145)

## 2021-04-19 LAB — URINALYSIS, ROUTINE W REFLEX MICROSCOPIC
Bacteria, UA: NONE SEEN
Bilirubin Urine: NEGATIVE
Glucose, UA: NEGATIVE mg/dL
Nitrite: NEGATIVE
Protein, ur: 100 mg/dL — AB
RBC / HPF: >50 RBC/hpf — ABNORMAL HIGH (ref 0–5)
Specific Gravity, Urine: >1.03 — ABNORMAL HIGH (ref 1.005–1.030)
WBC, UA: >50 WBC/hpf — ABNORMAL HIGH (ref 0–5)
pH: 6 (ref 5.0–8.0)

## 2021-04-19 MED ORDER — SODIUM CHLORIDE 0.9 % IV SOLN
1.0000 g | Freq: Once | INTRAVENOUS | Status: AC
  Administered 2021-04-19: 1 g via INTRAVENOUS
  Filled 2021-04-19: qty 10

## 2021-04-19 MED ORDER — CEFUROXIME AXETIL 500 MG PO TABS: 500.0000 mg | ORAL_TABLET | Freq: Two times a day (BID) | ORAL | 0 refills | Status: DC

## 2021-04-19 NOTE — Assessment & Plan Note (Signed)
Lab Results  Component Value Date   HGBA1C 7.7 (H) 02/10/2021   Mild uncontrolled, goal ldl < 7.5, pt to continue current medical treatment glucotrol, toujeo - declines change

## 2021-04-19 NOTE — ED Provider Notes (Signed)
Emergency Medicine Provider Triage Evaluation Note  Ettore Trebilcock , a 80 y.o. male  was evaluated in triage.  Pt complains of foley catheter concern.  Review of Systems  Positive: Abd pain, irritation at foley catheter site Negative: Fever, vomiting  Physical Exam  BP (!) 123/52 (BP Location: Left Arm)   Pulse 96   Temp 98.3 F (36.8 C) (Oral)   Resp 16   Ht 5\' 9"  (1.753 m)   Wt 84.8 kg   SpO2 100%   BMI 27.61 kg/m  Gen:   Awake, no distress   Resp:  Normal effort  MSK:   Moves extremities without difficulty  Other:    Medical Decision Making  Medically screening exam initiated at 12:13 PM.  Appropriate orders placed.  Cartier Washko was informed that the remainder of the evaluation will be completed by another provider, this initial triage assessment does not replace that evaluation, and the importance of remaining in the ED until their evaluation is complete.  Wife report when she cleans pt this AM she notice some yellow discharge coming from the urethral where the foley insertion is.  He also report having some discomfort.  Pt was previously treated for UTI.    Domenic Moras, PA-C 04/19/21 1218    Horton, Alvin Critchley, DO 04/19/21 1609

## 2021-04-19 NOTE — ED Triage Notes (Signed)
Patient 's wife reports that the patient had a catheter placed  on 03/30/21. That catheter was discontinued while in the neuro ICU. Patient was unable to void at that time.  On 04/04/21 patient went to [facility] and during that time the catheter had to be replaced. Patient was sent home 2 days ago with a foley catheter. Patient's wife reports that she noted yellow drainage at the insertion site and patient c/o burning.  Patient's wife reports that the patient was treated for a UTI while in rehab.

## 2021-04-19 NOTE — Assessment & Plan Note (Signed)
BP Readings from Last 3 Encounters:  04/19/21 111/73  04/17/21 110/60  04/03/21 137/66   Stable, pt to continue medical treatment - norvasc

## 2021-04-19 NOTE — Discharge Instructions (Addendum)
You were evaluated today for pain and drainage at the head of your penis where your Foley catheter is inserted.  Urinalysis was obtained and was not convincing of a urinary tract infection, however we we will send this off for culture.  You were noted to have an area of skin breakdown on the head of your penis and discharge which is likely to be the source of your infectious symptoms.  Prior to leaving the hospital you received a dose of antibiotic and a prescription was sent for an antibiotic called cefuroxime which you will take twice daily for 7 days.  You also need to keep this area clean and dry.  Further, we have also placed a referral for urology.  Return for reevaluation if you develop a fever, chills, or notice the redness and drainage is spreading beyond the head of the penis.

## 2021-04-19 NOTE — Assessment & Plan Note (Signed)
For b12 injeciton today,  to f/u any worsening symptoms or concerns

## 2021-04-19 NOTE — Assessment & Plan Note (Signed)
Currently stable, Pt is DNR, cont same tx -family support

## 2021-04-19 NOTE — ED Provider Notes (Signed)
[facility] DEPT Provider Note   CSN: [id number] Arrival date & time: 04/19/21  1144     History No chief complaint on file.   Frank [name] is a 80 y.o. male who presents with 1 day of yellow buildup around his Foley catheter insertion at the tip of his penis.  His wife states that when she went to do routine cleaning this morning she noticed there was a "yellow gunk".  Patient has not had any fever, chills, dysuria but does endorse urgency and pain at the tip of his penis.  His wife states that he was treated for UTI and yeast infection while at a SNF after carotid surgery in September.  She states that he is not one to complain of pain however when he raise concerns today she felt he needed to be seen in the emergency department.     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, Ischemic optic   OSA (obstructive sleep apnea)    cpap   Presence of permanent cardiac pacemaker    TBI (traumatic brain injury)     Patient Active Problem List   Diagnosis Date Noted   Carotid stenosis, symptomatic, with infarction ([place]) 03/31/2021   Infection of thumb 03/26/2021   Cellulitis of left hand 03/25/2021   Atherosclerosis of aorta ([name]) 02/10/2021   Generalized weakness 12/26/2020   Simple chronic bronchitis ([place]) 11/13/2020   Iron deficiency anemia secondary to inadequate dietary iron intake 11/06/2020   DNR (do not resuscitate) discussion 10/17/2020   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   Middle cerebral artery embolism, 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   AV block, 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 ([place]) 11/28/2019   Chronic renal disease, stage 3,  moderately decreased glomerular filtration rate (GFR) between 30-59 mL/min/1.73 square meter ([place]) 11/21/2018   GERD without esophagitis 07/22/2017   Depression    Type 2 diabetes mellitus with peripheral neuropathy ([name])    OAB (overactive bladder) 11/26/2014   Routine general medical examination at a health care facility 04/06/2014   Vitamin D deficiency 07/23/2010   Obstructive sleep apnea 09/13/2008   Hyperlipidemia with target LDL less than 100 07/27/2007   BPH (benign prostatic hyperplasia) 06/21/2007   OPTIC NEUROPATHY, 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   B12 deficiency anemia 04/15/2006   Essential hypertension 04/15/2006   Allergic rhinitis 04/15/2006    Past Surgical History:  Procedure Laterality Date   APPENDECTOMY     arm fracture Left 2011   with hardware   BURR HOLE Bilateral 08/04/2017   Procedure: Haskell Flirt;  Surgeon: [name], MD;  Location: [facility];  Service: Neurosurgery;  Laterality: Bilateral;   COLONOSCOPY     CRANIOTOMY Left 12/01/2017   Procedure: Trudee Kuster Holes CRANIOTOMY HEMATOMA EVACUATION SUBDURAL;  Surgeon: [name], MD;  Location: [facility];  Service: Neurosurgery;  Laterality: Left;   ESOPHAGOGASTRODUODENOSCOPY  2006   gastritis   IR ANGIO INTRA EXTRACRAN SEL COM CAROTID INNOMINATE UNI L MOD SED  03/31/2021   IR ANGIO VERTEBRAL SEL SUBCLAVIAN INNOMINATE UNI R MOD SED  03/31/2021   IR CT HEAD LTD  03/31/2021   IR  INTRAVSC STENT CERV CAROTID W/EMB-PROT MOD SED INCL ANGIO  03/31/2021   IR PERCUTANEOUS ART THROMBECTOMY/INFUSION INTRACRANIAL INC DIAG ANGIO  09/09/2020   IR RADIOLOGIST EVAL & MGMT  03/10/2021   PACEMAKER IMPLANT N/A 05/14/2020   Procedure: PACEMAKER IMPLANT;  Surgeon: [name], MD;  Location: [facility];  Service: Cardiovascular;  Laterality: N/A;   RADIOLOGY WITH ANESTHESIA N/A 09/09/2020   Procedure: IR WITH ANESTHESIA;  Surgeon: Radiologist, Medication, MD;  Location: [facility];  Service: Radiology;  Laterality: N/A;   RADIOLOGY WITH ANESTHESIA N/A  03/31/2021   Procedure: IR WITH ANESTHESIA STENT PLACEMENT;  Surgeon: Luanne Bras, MD;  Location: [facility];  Service: Radiology;  Laterality: N/A;   ROTATOR CUFF REPAIR     SEPTOPLASTY  1959   Deviated Septum   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       Family History  Problem Relation Age of Onset   Heart disease Mother    Breast cancer Mother    Stroke Father    Allergies Father        Father and children   Coronary artery disease Brother    Diabetes Neg Hx    Colon cancer Neg Hx     Social History   Tobacco Use   Smoking status: Former    Packs/day: 1.00    Types: Cigarettes    Quit date: 07/14/1983    Years since quitting: 37.7   Smokeless tobacco: Never  Vaping Use   Vaping Use: Never used  Substance Use Topics   Alcohol use: Yes    Alcohol/week: 2.0 standard drinks    Types: 2 Standard drinks or equivalent per week    Comment: occassionally   Drug use: No    Home Medications Prior to Admission medications   Medication Sig Start Date End Date Taking? Authorizing Provider  cefUROXime (CEFTIN) 500 MG tablet Take 1 tablet (500 mg total) by mouth 2 (two) times daily with a meal. 04/19/21  Yes Farrel Gordon, DO  acetaminophen (TYLENOL) 500 MG tablet Take 500-1,000 mg by mouth every 6 (six) hours as needed for moderate pain or headache.    [provider]  amLODipine (NORVASC) 10 MG tablet Take 1 tablet (10 mg total) by mouth daily. Patient taking differently: Take 10 mg by mouth at bedtime. 09/18/20   Briant Cedar, MD  aspirin 81 MG chewable tablet Chew 1 tablet (81 mg total) by mouth daily. 09/18/20   Briant Cedar, MD  atorvastatin (LIPITOR) 10 MG tablet Take 1 tablet (10 mg total) by mouth daily. 09/18/20   Briant Cedar, MD  docusate sodium (COLACE) 100 MG capsule Take 1 capsule (100 mg total) by mouth 2 (two) times daily. 04/03/21   Swayze, Ava, DO  Ferric Maltol (ACCRUFER) 30 MG CAPS Take 1 capsule by mouth in the morning and at  bedtime. Patient taking differently: Take 30 mg by mouth in the morning and at bedtime. 11/06/20   Janith Lima, MD  FLUoxetine (PROZAC) 20 MG capsule TAKE 1 CAPSULE BY MOUTH  DAILY Patient taking differently: Take 20 mg by mouth daily. 03/13/21   Janith Lima, MD  fluticasone (FLONASE) 50 MCG/ACT nasal spray Place 1-2 sprays into both nostrils daily as needed for allergies or rhinitis.    [provider]  glipiZIDE (GLUCOTROL XL) 10 MG 24 hr tablet TAKE 1 TABLET BY MOUTH  DAILY 03/28/21   Janith Lima, MD  insulin glargine, 2 Unit Dial, ([place])  300 UNIT/ML Solostar Pen Inject 10 Units into the skin daily. 01/09/21   Janith Lima, MD  Insulin Pen Needle 32G X 6 MM MISC 1 Act by Does not apply route daily. 04/08/20   Janith Lima, MD  levETIRAcetam (KEPPRA) 500 MG tablet Take 1 tablet (500 mg total) by mouth 2 (two) times daily. 01/19/21   Janith Lima, MD  pantoprazole (PROTONIX) 40 MG tablet TAKE 1 TABLET BY MOUTH  DAILY Patient taking differently: Take 40 mg by mouth daily. 02/19/21   Janith Lima, MD  PRESCRIPTION MEDICATION CPAP- At bedtime    [provider]  solifenacin (VESICARE) 10 MG tablet TAKE 1 TABLET BY MOUTH  DAILY Patient taking differently: Take 10 mg by mouth daily. 03/16/21   Janith Lima, MD  tamsulosin (FLOMAX) 0.4 MG CAPS capsule Take 1 capsule (0.4 mg total) by mouth daily after supper. 04/03/21   Swayze, Ava, DO  ticagrelor (BRILINTA) 90 MG TABS tablet Take 0.5 tablets (45 mg total) by mouth 2 (two) times daily. 11/14/20   Janith Lima, MD    Allergies    Lisinopril, Invokana [canagliflozin], Penicillins, and Sulfamethoxazole  Review of Systems   Review of Systems  Constitutional:  Negative for chills and fever.  Respiratory:  Negative for shortness of breath.   Genitourinary:  Positive for penile discharge, penile pain and urgency. Negative for dysuria.  Neurological:  Positive for weakness.   Physical Exam Updated  Vital Signs BP 127/79   Pulse 85   Temp 98.3 F (36.8 C) (Oral)   Resp 18   Ht 5\' 9"  (1.753 m)   Wt 84.8 kg   SpO2 94%   BMI 27.61 kg/m   Constitutional: Elderly gentleman in no acute distress. Cardio: Regular rate and rhythm.  No murmurs, rubs, gallops. Pulm: Clear to auscultation bilaterally.  Normal work of breathing on room air. GU: Purulent discharge around head of penis.  Area of skin breakdown on head of penis likely 2/2 to Foley catheter.  Erythema is limited to the penis. MSK: Decreased bulk and tone. Skin: Skin is warm and dry. Neuro: Alert and oriented x3.  No focal deficit noted. Psych: Normal mood and affect.  ED Results / Procedures / Treatments   Labs (all labs ordered are listed, but only abnormal results are displayed) Labs Reviewed  URINALYSIS, ROUTINE W REFLEX MICROSCOPIC - Abnormal; Notable for the following components:      Result Value   Color, Urine YELLOW (*)    APPearance CLOUDY (*)    Specific Gravity, Urine >1.030 (*)    Hgb urine dipstick MODERATE (*)    Ketones, ur TRACE (*)    Protein, ur 100 (*)    Leukocytes,Ua MODERATE (*)    RBC / HPF >50 (*)    WBC, UA >50 (*)    All other components within normal limits  BASIC METABOLIC PANEL - Abnormal; Notable for the following components:   Sodium 133 (*)    Glucose, Bld 309 (*)    BUN 27 (*)    Creatinine, Ser 1.47 (*)    GFR, Estimated 47 (*)    All other components within normal limits  CBC WITH DIFFERENTIAL/PLATELET - Abnormal; Notable for the following components:   WBC 12.3 (*)    RBC 3.96 (*)    Hemoglobin 10.7 (*)    HCT 33.9 (*)    Neutro Abs 9.6 (*)    Monocytes Absolute 1.1 (*)    All other components  within normal limits  URINE CULTURE    EKG None  Radiology No results found.  Procedures None  Medications Ordered in ED Medications  cefTRIAXone (ROCEPHIN) 1 g in sodium chloride 0.9 % 100 mL IVPB (has no administration in time range)    ED Course  I have reviewed  the triage vital signs and the nursing notes.  Pertinent labs & imaging results that were available during my care of the patient were reviewed by me and considered in my medical decision making (see chart for details).    MDM Rules/Calculators/A&P                           Patient presents with 1 day of pain at the head of the penis with associated purulent discharge with Foley catheter.  Patient endorses that the pain is localized to the head of the penis and he does have a sense of urgency, however he denies dysuria, fever, chills.  He does have a leukocytosis of 12.3.  Urinalysis was not convincing for acute cystitis and is comparable to previous collections as recently in September of this year however the sample will be sent for culture.  At this time the patient's presentation is more concerning for a superficial infection complicated by skin breakdown.  Prior to discharge he received Rocephin 1 g.  Patient will discharge home on cefuroxime 5 mg twice daily for 7 days.  Final Clinical Impression(s) / ED Diagnoses Final diagnoses:  Skin breakdown  Local skin infection    Rx / DC Orders ED Discharge Orders          Ordered    Ambulatory referral to Urology        04/19/21 2127    cefUROXime (CEFTIN) 500 MG tablet  2 times daily with meals        04/19/21 2143             Farrel Gordon, DO 04/19/21 2144    Long, Wonda Olds, MD 04/20/21 2122

## 2021-04-19 NOTE — Assessment & Plan Note (Signed)
Stable, for bmp f/u today

## 2021-04-21 ENCOUNTER — Other Ambulatory Visit: Payer: Self-pay

## 2021-04-21 ENCOUNTER — Ambulatory Visit (HOSPITAL_COMMUNITY)
Admission: RE | Admit: 2021-04-21 | Discharge: 2021-04-21 | Disposition: A | Discharge status: Home / Self Care | Payer: Medicare Other | Source: Ambulatory Visit | Attending: Radiology | Admitting: Radiology

## 2021-04-21 ENCOUNTER — Other Ambulatory Visit (HOSPITAL_COMMUNITY): Payer: Self-pay

## 2021-04-21 DIAGNOSIS — I771 Stricture of artery: Secondary | ICD-10-CM

## 2021-04-21 DIAGNOSIS — Z7982 Long term (current) use of aspirin: Secondary | ICD-10-CM | POA: Insufficient documentation

## 2021-04-21 DIAGNOSIS — R531 Weakness: Secondary | ICD-10-CM | POA: Insufficient documentation

## 2021-04-21 DIAGNOSIS — Z794 Long term (current) use of insulin: Secondary | ICD-10-CM | POA: Diagnosis not present

## 2021-04-21 DIAGNOSIS — Z7984 Long term (current) use of oral hypoglycemic drugs: Secondary | ICD-10-CM | POA: Insufficient documentation

## 2021-04-21 DIAGNOSIS — Z888 Allergy status to other drugs, medicaments and biological substances status: Secondary | ICD-10-CM | POA: Diagnosis not present

## 2021-04-21 DIAGNOSIS — I63231 Cerebral infarction due to unspecified occlusion or stenosis of right carotid arteries: Secondary | ICD-10-CM | POA: Diagnosis not present

## 2021-04-21 DIAGNOSIS — I6521 Occlusion and stenosis of right carotid artery: Secondary | ICD-10-CM | POA: Insufficient documentation

## 2021-04-21 DIAGNOSIS — Z79899 Other long term (current) drug therapy: Secondary | ICD-10-CM | POA: Diagnosis not present

## 2021-04-21 DIAGNOSIS — Z87891 Personal history of nicotine dependence: Secondary | ICD-10-CM | POA: Insufficient documentation

## 2021-04-21 DIAGNOSIS — Z7902 Long term (current) use of antithrombotics/antiplatelets: Secondary | ICD-10-CM | POA: Diagnosis not present

## 2021-04-21 DIAGNOSIS — Z9889 Other specified postprocedural states: Secondary | ICD-10-CM | POA: Diagnosis not present

## 2021-04-22 LAB — URINE CULTURE: Culture: 40000 — AB

## 2021-04-23 ENCOUNTER — Telehealth: Payer: Self-pay | Admitting: Emergency Medicine

## 2021-04-23 NOTE — Telephone Encounter (Signed)
Post ED Visit - Positive Culture Follow-up  Culture report reviewed by antimicrobial stewardship pharmacist: [place] [facility] Team []  Elenor Quinones, Pharm.D. []  Heide Guile, Pharm.D., BCPS AQ-ID []  Parks Neptune, Pharm.D., BCPS []  Alycia Rossetti, Pharm.D., BCPS []  [place], Pharm.D., BCPS, AAHIVP []  Legrand Como, Pharm.D., BCPS, AAHIVP []  Salome Arnt, PharmD, BCPS []  Johnnette Gourd, PharmD, BCPS []  Hughes Better, PharmD, BCPS []  Leeroy Cha, PharmD []  Laqueta Linden, PharmD, BCPS []  Albertina Parr, PharmD  [place] Team []  Leodis Sias, PharmD []  Lindell Spar, PharmD []  Royetta Asal, PharmD []  Graylin Shiver, Rph []  Rema Fendt) Glennon Mac, PharmD []  Arlyn Dunning, PharmD []  Netta Cedars, PharmD []  Dia Sitter, PharmD []  Leone Haven, PharmD []  Gretta Arab, PharmD []  Theodis Shove, PharmD []  Peggyann Juba, PharmD [x]  Adria Dill, PharmD   Positive urine culture Treated with Cefuroxime, organism sensitive to the same and no further patient follow-up is required at this time.  Sandi Raveling Dawnna Gritz 04/23/2021, 10:48 AM

## 2021-04-24 ENCOUNTER — Ambulatory Visit (INDEPENDENT_AMBULATORY_CARE_PROVIDER_SITE_OTHER): Payer: Medicare Other | Admitting: Internal Medicine

## 2021-04-24 ENCOUNTER — Telehealth: Payer: Self-pay | Admitting: Internal Medicine

## 2021-04-24 ENCOUNTER — Other Ambulatory Visit: Payer: Self-pay

## 2021-04-24 ENCOUNTER — Encounter: Payer: Self-pay | Admitting: Internal Medicine

## 2021-04-24 VITALS — BP 110/60 | HR 86 | Temp 99.1°F | Ht 69.0 in

## 2021-04-24 DIAGNOSIS — I10 Essential (primary) hypertension: Secondary | ICD-10-CM

## 2021-04-24 DIAGNOSIS — E1142 Type 2 diabetes mellitus with diabetic polyneuropathy: Secondary | ICD-10-CM | POA: Diagnosis not present

## 2021-04-24 DIAGNOSIS — N1832 Chronic kidney disease, stage 3b: Secondary | ICD-10-CM

## 2021-04-24 NOTE — Telephone Encounter (Signed)
Robert Bellow from [facility] has called and is requesting additional information about the pt. Catheter and the drainage at the tip of penis. States that she needs to know why, when, and where the catheter was placed, who has been changing the catheter, as well as if the pt. Has seen any other urologist in the past. Would like a call back from provider's assistant ASAP.    Callback #- [phone]

## 2021-04-24 NOTE — Telephone Encounter (Signed)
LVM on nurse line stating no records of catheter placement however I did see a referral entered for urology back in 2017 to [facility].

## 2021-04-24 NOTE — Progress Notes (Signed)
Patient ID: Frank [name], male   DOB: 25-Sep-1940, 80 y.o.   MRN: [id number]        Chief Complaint: follow up elevated BS's x 3 days with wife       HPI:  Frank [name] is a 80 y.o. male here with c/o increased elevated CBGs at home in the high 100s/low 200s with recent balanitis tx per urology, but now improving and CBG this am was 117.  Pt denies chest pain, increased sob or doe, wheezing, orthopnea, PND, increased LE swelling, palpitations, dizziness or syncope.  . Pt denies polydipsia, polyuria, or low sugar symptoms   Has f/u with urology at [facility] mon oct 17.  Overall pt feeling better, even did a few squats for exercise yesterday.  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, Ischemic optic   OSA (obstructive sleep apnea)    cpap   Presence of permanent cardiac pacemaker    TBI (traumatic brain injury)    Past Surgical History:  Procedure Laterality Date   APPENDECTOMY     arm fracture Left 2011   with hardware   BURR HOLE Bilateral 08/04/2017   Procedure: Haskell Flirt;  Surgeon: [name], MD;  Location: [facility];  Service: Neurosurgery;  Laterality: Bilateral;   COLONOSCOPY     CRANIOTOMY Left 12/01/2017   Procedure: Trudee Kuster Holes CRANIOTOMY HEMATOMA EVACUATION SUBDURAL;  Surgeon: [name], MD;  Location: [facility];  Service: Neurosurgery;  Laterality: Left;   ESOPHAGOGASTRODUODENOSCOPY  2006   gastritis   IR ANGIO INTRA EXTRACRAN SEL COM CAROTID INNOMINATE UNI L MOD SED   03/31/2021   IR ANGIO VERTEBRAL SEL SUBCLAVIAN INNOMINATE UNI R MOD SED  03/31/2021   IR CT HEAD LTD  03/31/2021   IR INTRAVSC STENT CERV CAROTID W/EMB-PROT MOD SED INCL ANGIO  03/31/2021   IR PERCUTANEOUS ART THROMBECTOMY/INFUSION INTRACRANIAL INC DIAG ANGIO  09/09/2020   IR RADIOLOGIST EVAL & MGMT  03/10/2021   PACEMAKER IMPLANT N/A 05/14/2020   Procedure: PACEMAKER IMPLANT;  Surgeon: [name], MD;  Location: 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 His smoking use included cigarettes. He smoked an average of 1 pack per day. He has never used smokeless tobacco. He reports current  alcohol use of about 2.0 standard drinks per week. He reports that he does not use drugs. family history includes Allergies in his father; Breast cancer in his mother; Coronary artery disease in his brother; Heart disease in his mother; Stroke in his father. Allergies  Allergen Reactions   Lisinopril Cough   Invokana [Canagliflozin] Other (See Comments)    Stopped by MD, unknown reaction    Penicillins Other (See Comments)    Allergic as a child.  Patient does not remember reaction.  Has had cephalasporins without problems.   Sulfamethoxazole Other (See Comments)    Unknown reaction- from childhood   Current Outpatient Medications on File Prior to Visit  Medication Sig Dispense Refill   acetaminophen (TYLENOL) 500 MG tablet Take 500-1,000 mg by mouth every 6 (six) hours as needed for  moderate pain or headache.     amLODipine (NORVASC) 10 MG tablet Take 1 tablet (10 mg total) by mouth daily. (Patient taking differently: Take 10 mg by mouth at bedtime.)     aspirin 81 MG chewable tablet Chew 1 tablet (81 mg total) by mouth daily.     atorvastatin (LIPITOR) 10 MG tablet Take 1 tablet (10 mg total) by mouth daily.     cefUROXime (CEFTIN) 500 MG tablet Take 1 tablet (500 mg total) by mouth 2 (two) times daily with a meal. 14 tablet 0   docusate sodium (COLACE) 100 MG capsule Take 1 capsule (100 mg total) by mouth 2 (two) times daily. 10 capsule 0   Ferric Maltol (ACCRUFER) 30 MG CAPS Take 1 capsule by mouth in the morning and at bedtime. (Patient taking differently: Take 30 mg by mouth in the morning and at bedtime.) 180 capsule 1   FLUoxetine (PROZAC) 20 MG capsule TAKE 1 CAPSULE BY MOUTH  DAILY (Patient taking differently: Take 20 mg by mouth daily.) 90 capsule 1   fluticasone (FLONASE) 50 MCG/ACT nasal spray Place 1-2 sprays into both nostrils daily as needed for allergies or rhinitis.     glipiZIDE (GLUCOTROL XL) 10 MG 24 hr tablet TAKE 1 TABLET BY MOUTH  DAILY 90 tablet 0   insulin glargine, 2 Unit Dial, (TOUJEO MAX SOLOSTAR) 300 UNIT/ML Solostar Pen Inject 10 Units into the skin daily. 6 mL 1   Insulin Pen Needle 32G X 6 MM MISC 1 Act by Does not apply route daily. 100 each 1   levETIRAcetam (KEPPRA) 500 MG tablet Take 1 tablet (500 mg total) by mouth 2 (two) times daily. 180 tablet 1   pantoprazole (PROTONIX) 40 MG tablet TAKE 1 TABLET BY MOUTH  DAILY (Patient taking differently: Take 40 mg by mouth daily.) 90 tablet 1   PRESCRIPTION MEDICATION CPAP- At bedtime     solifenacin (VESICARE) 10 MG tablet TAKE 1 TABLET BY MOUTH  DAILY (Patient taking differently: Take 10 mg by mouth daily.) 90 tablet 1   tamsulosin (FLOMAX) 0.4 MG CAPS capsule Take 1 capsule (0.4 mg total) by mouth daily after supper. 30 capsule 0   ticagrelor (BRILINTA) 90 MG TABS tablet Take 0.5 tablets (45 mg  total) by mouth 2 (two) times daily. 180 tablet 0   No current facility-administered medications on file prior to visit.        ROS:  All others reviewed and negative.  Objective        PE:  BP 110/60 (BP Location: Right Arm, Patient Position: Sitting, Cuff Size: Large)   Pulse 86   Temp 99.1 F (37.3 C) (Oral)  Ht 5\' 9"  (1.753 m)   SpO2 96%   BMI 27.61 kg/m                 Constitutional: Pt appears in NAD               HENT: Head: NCAT.                Right Ear: External ear normal.                 Left Ear: External ear normal.                Eyes: . Pupils are equal, round, and reactive to light. Conjunctivae and EOM are normal               Nose: without d/c or deformity               Neck: Neck supple. Gross normal ROM               Cardiovascular: Normal rate and regular rhythm.                 Pulmonary/Chest: Effort normal and breath sounds without rales or wheezing.                Abd:  Soft, NT, ND, + BS, no organomegaly               Neurological: Pt is alert. At baseline orientation, motor grossly intact               Skin: Skin is warm. No rashes, no other new lesions, LE edema - trace bilateral               Psychiatric: Pt behavior is normal without agitation   Micro: none  Cardiac tracings I have personally interpreted today:  none  Pertinent Radiological findings (summarize): none   Lab Results  Component Value Date   WBC 12.3 (H) 04/19/2021   HGB 10.7 (L) 04/19/2021   HCT 33.9 (L) 04/19/2021   PLT 210 04/19/2021   GLUCOSE 309 (H) 04/19/2021   CHOL 104 11/19/2020   TRIG 91 11/19/2020   HDL 33 (L) 11/19/2020   LDLCALC 53 11/19/2020   ALT 23 04/17/2021   AST 17 04/17/2021   NA 133 (L) 04/19/2021   K 4.3 04/19/2021   CL 98 04/19/2021   CREATININE 1.47 (H) 04/19/2021   BUN 27 (H) 04/19/2021   CO2 25 04/19/2021   TSH 0.459 11/18/2020   PSA 1.08 07/10/2015   INR 0.9 03/30/2021   HGBA1C 7.7 (H) 02/10/2021   MICROALBUR 67.2 04/23/2020    Assessment/Plan:  Izac Faulkenberry is a 80 y.o. White or Caucasian [1] male with  has a past medical history of Allergy, Anemia, Arthritis, AV block, Mobitz 2 (05/14/2020), Diabetes mellitus, GERD (gastroesophageal reflux disease), Hyperlipidemia, Hypertension, Neuropathy (2001), OSA (obstructive sleep apnea), Presence of permanent cardiac pacemaker, and TBI (traumatic brain injury).  Type 2 diabetes mellitus with peripheral neuropathy ([name]) Uncontrolled, with transient elevation likely due to balanits now improved today with balanitis improved; ok to continue to monitor, cont same tx  Lab Results  Component Value Date   HGBA1C 7.7 (H) 02/10/2021    Chronic renal disease, stage 3, moderately decreased glomerular filtration rate (GFR) between 30-59 mL/min/1.73 square meter ([name]) Lab Results  Component Value Date   CREATININE 1.47 (H) 04/19/2021   Stable overall, cont to avoid nephrotoxins   Essential hypertension BP Readings  from Last 3 Encounters:  04/24/21 110/60  04/19/21 130/63  04/17/21 110/60   Stable and low normal, tolerating ok, pt to continue medical treatment norvasc  Followup: Return if symptoms worsen or fail to improve.  Cathlean Cower, MD 04/26/2021 9:09 PM [facility]

## 2021-04-25 LAB — PLATELET INHIBITION P2Y12

## 2021-04-26 ENCOUNTER — Encounter: Payer: Self-pay | Admitting: Internal Medicine

## 2021-04-26 NOTE — Assessment & Plan Note (Signed)
Uncontrolled, with transient elevation likely due to balanits now improved today with balanitis improved; ok to continue to monitor, cont same tx  Lab Results  Component Value Date   HGBA1C 7.7 (H) 02/10/2021

## 2021-04-26 NOTE — Patient Instructions (Signed)
Please continue all other medications as before, and refills have been done if requested.  Please have the pharmacy call with any other refills you may need.  Please continue your efforts at being more active, low cholesterol diet, and weight control.  Please keep your appointments with your specialists as you may have planned     

## 2021-04-26 NOTE — Assessment & Plan Note (Signed)
BP Readings from Last 3 Encounters:  04/24/21 110/60  04/19/21 130/63  04/17/21 110/60   Stable and low normal, tolerating ok, pt to continue medical treatment norvasc

## 2021-04-26 NOTE — Assessment & Plan Note (Signed)
Lab Results  Component Value Date   CREATININE 1.47 (H) 04/19/2021   Stable overall, cont to avoid nephrotoxins

## 2021-04-28 ENCOUNTER — Telehealth: Payer: Self-pay

## 2021-04-28 ENCOUNTER — Ambulatory Visit (INDEPENDENT_AMBULATORY_CARE_PROVIDER_SITE_OTHER): Payer: Medicare Other

## 2021-04-28 DIAGNOSIS — D510 Vitamin B12 deficiency anemia due to intrinsic factor deficiency: Secondary | ICD-10-CM

## 2021-04-28 DIAGNOSIS — Z978 Presence of other specified devices: Secondary | ICD-10-CM | POA: Diagnosis not present

## 2021-04-28 DIAGNOSIS — N3281 Overactive bladder: Secondary | ICD-10-CM | POA: Diagnosis not present

## 2021-04-28 DIAGNOSIS — R338 Other retention of urine: Secondary | ICD-10-CM | POA: Diagnosis not present

## 2021-04-28 DIAGNOSIS — R339 Retention of urine, unspecified: Secondary | ICD-10-CM | POA: Diagnosis not present

## 2021-04-28 HISTORY — PX: IR RADIOLOGIST EVAL & MGMT: IMG5224

## 2021-04-28 MED ORDER — CYANOCOBALAMIN 1000 MCG/ML IJ SOLN
1000.0000 ug | Freq: Once | INTRAMUSCULAR | Status: AC
  Administered 2021-04-28: 1000 ug via INTRAMUSCULAR

## 2021-04-28 NOTE — Progress Notes (Signed)
Chronic Care Management Pharmacy Assistant   Name: Frank [name] MRN: [id number] DOB: Nov 02, 1940   Reason for Encounter: Disease State - General Adherence   Recent office visits:  04/24/2021 Frank [name] ([facility]) - Type 2 diabetes mellitus with peripheral neuropathy. No med changes.  04/17/2021 Frank [name] ([facility]) - Type 2 diabetes mellitus with peripheral neuropathy. No med changes.  03/25/21 Frank [name] ([facility]) - Cellulitis of left hand. Start Amoxicillin 875-125 mg.   Recent consult visits:  04/19/21 Frank [name] ([facility]) - Skin breakdown. Start Cefuroxime Axetil 500 mg.  Hospital visits:  Medication Reconciliation was completed by comparing discharge summary, patient's EMR and Pharmacy list, and upon discussion with patient.  Admitted to the hospital on 03/26/21 due to Infection of thumb. Discharge date was 04/03/21. Discharged from [facility].    START taking: docusate sodium (COLACE) tamsulosin (Flomax) STOP taking: amoxicillin-clavulanate 875-125 MG tablet (AUGMENTIN)  Medications that remain the same after Hospital Discharge:??  -All other medications will remain the same.    Medications: Outpatient Encounter Medications as of 04/28/2021  Medication Sig   acetaminophen (TYLENOL) 500 MG tablet Take 500-1,000 mg by mouth every 6 (six) hours as needed for moderate pain or headache.   amLODipine (NORVASC) 10 MG tablet Take 1 tablet (10 mg total) by mouth daily. (Patient taking differently: Take 10 mg by mouth at bedtime.)   aspirin 81 MG chewable tablet Chew 1 tablet (81 mg total) by mouth daily.   atorvastatin (LIPITOR) 10 MG tablet Take 1 tablet (10 mg total) by mouth daily.   cefUROXime (CEFTIN) 500 MG tablet Take 1 tablet (500 mg total) by mouth 2 (two) times daily with a meal.   docusate sodium (COLACE) 100 MG capsule Take 1 capsule (100 mg total) by mouth 2 (two) times daily.   Ferric Maltol (ACCRUFER) 30 MG CAPS Take 1 capsule by mouth in the morning and at bedtime. (Patient  taking differently: Take 30 mg by mouth in the morning and at bedtime.)   FLUoxetine (PROZAC) 20 MG capsule TAKE 1 CAPSULE BY MOUTH  DAILY (Patient taking differently: Take 20 mg by mouth daily.)   fluticasone (FLONASE) 50 MCG/ACT nasal spray Place 1-2 sprays into both nostrils daily as needed for allergies or rhinitis.   glipiZIDE (GLUCOTROL XL) 10 MG 24 hr tablet TAKE 1 TABLET BY MOUTH  DAILY   insulin glargine, 2 Unit Dial, (TOUJEO MAX SOLOSTAR) 300 UNIT/ML Solostar Pen Inject 10 Units into the skin daily.   Insulin Pen Needle 32G X 6 MM MISC 1 Act by Does not apply route daily.   levETIRAcetam (KEPPRA) 500 MG tablet Take 1 tablet (500 mg total) by mouth 2 (two) times daily.   pantoprazole (PROTONIX) 40 MG tablet TAKE 1 TABLET BY MOUTH  DAILY (Patient taking differently: Take 40 mg by mouth daily.)   PRESCRIPTION MEDICATION CPAP- At bedtime   solifenacin (VESICARE) 10 MG tablet TAKE 1 TABLET BY MOUTH  DAILY (Patient taking differently: Take 10 mg by mouth daily.)   tamsulosin (FLOMAX) 0.4 MG CAPS capsule Take 1 capsule (0.4 mg total) by mouth daily after supper.   ticagrelor (BRILINTA) 90 MG TABS tablet Take 0.5 tablets (45 mg total) by mouth 2 (two) times daily.   No facility-administered encounter medications on file as of 04/28/2021.   Have you had any problems recently with your health? Patient states he currently has a catheter in and has a follow up appointment on Friday with his urologists.  Have you had any problems with  your pharmacy? Patient states no problems he uses [facility].   What issues or side effects are you having with your medications? Patient states no issues or side effects.   What would you like me to pass along to [facility] for them to help you with?  Patient states nothing at this time. Follow up appointment has been scheduled for Nov. 1, 2022 @ 10 am.   What can we do to take care of you better? Patient states no concerns at this time.     Star Rating Drugs: Atorvastatin - last fill 12/19/20 90D Glipizide - last filled 11/06/20 [place], [place] Pharmacists Assistant [phone]

## 2021-04-28 NOTE — Progress Notes (Signed)
Pt here for monthly B12 injection per Dr. Jones  B12 1000mcg given IM, and pt tolerated injection well.   

## 2021-04-28 NOTE — Telephone Encounter (Signed)
LATE ENTRY Palliative care RN/SW initial visit scheduled for  05/01/21 @ 12 pm.

## 2021-05-01 ENCOUNTER — Other Ambulatory Visit: Payer: Medicare Other | Admitting: *Deleted

## 2021-05-01 ENCOUNTER — Other Ambulatory Visit: Payer: Self-pay

## 2021-05-01 ENCOUNTER — Other Ambulatory Visit: Payer: Medicare Other

## 2021-05-01 VITALS — BP 120/67 | HR 76 | Temp 97.7°F | Resp 16

## 2021-05-01 DIAGNOSIS — Z515 Encounter for palliative care: Secondary | ICD-10-CM

## 2021-05-01 NOTE — Progress Notes (Signed)
COMMUNITY PALLIATIVE CARE SW NOTE  PATIENT NAME: Frank [name] DOB: 05/02/1941 MRN: [id number]  PRIMARY CARE PROVIDER: Janith Lima, MD  RESPONSIBLE PARTY:  Acct ID - Guarantor Home Phone Work Phone Relationship Acct Type  [id number] DEVERY, [name]  Self P/F     [place], [place], [place] [address]     PLAN OF CARE and INTERVENTIONS:             GOALS OF CARE/ ADVANCE CARE PLANNING:  Goal is for patient to get stronger and to discontinue use of a cathter.  SOCIAL/EMOTIONAL/SPIRITUAL ASSESSMENT/ INTERVENTIONS:  SW and RN-M. Nadara Mustard completed a follow-up visit with patient at his home where he was present with his wife and sitter-Nykita. Patient was sitting up in his wheelchair, awake, alert and cordial with the team. He denied pain. He was able to participate in dialogue with the team as his wife and sitter provided an update on his status and medical history. Patient report that his catheter was doing okay and he feels "it is not as easy as the others that he has had". He explained that he is not able to urinate easily with it. His appetite remains good as he is eating 3 full meals per day and is having no swallowing issues. Since his stroke patient is having increased weakness. Prior receiving rehab at [facility], patient was able to ambulate several laps around the church, now he spends most of his time in the wheelchair, only taking a few steps to transfer. Patient needs assistance with ADL's except for feedings. Patient report that he sleeps well at night despite napping during the day. Patient's cognition is declining as he is having increased confusion and forgetfulness. Patient is scheduled to see a brain specialist in [facility]. Patient continues to enjoy watching sports particularly [place] basketball. Patient also has a great sense of humor. He and his wife remain open to ongoing palliative care visits/support.  PATIENT/CAREGIVER EDUCATION/ COPING:   Patient appears to be coping well. He has a great advocate and support through his wife. PERSONAL EMERGENCY PLAN:  911 can be activated for emergencies. COMMUNITY RESOURCES COORDINATION/ HEALTH CARE NAVIGATION:  Patient has private caregivers through [place]. Monday-Saturday for 8:30 am-4:30 pm and then 11 pm-7 am.       Patient will also begin PT 2x/week  and RN visits to manage his cathter. FINANCIAL/LEGAL CONCERNS/INTERVENTIONS:  None     SOCIAL HX:  Social History   Tobacco Use   Smoking status: Former    Packs/day: 1.00    Types: Cigarettes    Quit date: 07/14/1983    Years since quitting: 37.8   Smokeless tobacco: Never  Substance Use Topics   Alcohol use: Yes    Alcohol/week: 2.0 standard drinks    Types: 2 Standard drinks or equivalent per week    Comment: occassionally    CODE STATUS: To be assessed ADVANCED DIRECTIVES: Yes MOST FORM COMPLETE: Yes HOSPICE EDUCATION PROVIDED: No  PPS: Patient is alert and oriented to self and situation. He is dependent for ADL's except for feeding. Patient ambulates himself in his wheelchair.    Duration of visit and documentation: 60 minutes.   [address] [place], [place]

## 2021-05-06 NOTE — Progress Notes (Signed)
[facility] PALLIATIVE C[facility] RN NOTE  PATIENT NAME: Truitt Cruey DOB: Dec 18, 1940 MRN: [id number]  PRIMARY CARE PROVIDER: Janith Lima, MD  RESPONSIBLE PARTY:  Acct ID - Guarantor Home Phone Work Phone Relationship Acct Type  [id number] YOREL, [phone]  Self P/F     [place], [place], [place] [address]   Covid-19 Pre-screening Negative  PLAN OF CARE and INTERVENTION:  ADVANCE CARE PLANNING/GOALS OF CARE: Goal is for patient to remain at home with his wife and become stronger and more ambulatory. He has a DNR. PATIENT/CAREGIVER EDUCATION: Symptom management, safe mobility/transfers, s/s of infection DISEASE STATUS: Joint visit made with LCSW, M. Lonon. Met with patient, wife and hired caregiver through [place] in the home. Patient sitting up in his wheelchair awake and alert. He is very pleasant and engaging. He is able to answer most questions appropriately and make his needs known. He does have dementia. His wife has noticed that his cognition has been "off." She also reports that he has become weaker with increased falls and less ambulatory and spends most of his time sitting up in the wheelchair since his stroke earlier this year. He was hospitalized from 03/26/21 to 04/03/21 due a thumb infection. 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 He then went to [facility] for rehab. At home he is able to ambulate about 150 ft with rollator and 1 person assistance. He has been working with in-home physical therapy on strengthening and gait stability until he is strong enough to receive therapy outpatient. He has a foley catheter. Denies pain or discomfort. Irritation to tip of penis has resolved. He requires assistance with bathing and dressing. His appetite is good and they are pushing fluids. His blood sugar was 138 today. His bowels have been regular, usually occurring  daily. He takes a nap every day now and usually tires after lunch. He is sleeping well throughout the night. He has an appointment with Dr. Werner Lean with at [facility] in the morning. He has a test of 05/12/21 to see is he can void on his own. Possible cystoscopy scheduled for 07/01/21. He is to have a MRI in the next 5-6 months per wife.   HISTORY OF PRESENT ILLNESS: This is a 80 yo male with a diagnosis of dementia. He has a past medical history of stroke, hypertension, aortic atherosclerosis, GERD, DM II and BPH. Palliative care team continues to follow patient for additional support, goals of care and complex decision making.  CODE STATUS: DNR ADVANCED DIRECTIVES: Y MOST FORM: yes PPS: 50%   PHYSICAL EXAM:   VITALS: Today's Vitals   05/01/21 1234  BP: 120/67  Pulse: 76  Resp: 16  Temp: 97.7 F (36.5 C)  TempSrc: Temporal  SpO2: 94%  PainSc: 0-No pain    LUNGS: clear to auscultation  CARDIAC: Cor RRR; pacemaker EXTREMITIES: No edema SKIN:  Exposed skin is dry and intact   NEURO:  Alert and oriented to person/place, intermittent confusion/forgetfulness, generalized weakness, ambulator w/rollator walker and 1 person assistance    (Duration of visit and documentation 60 minutes   Daryl Eastern, RN BSN

## 2021-05-08 ENCOUNTER — Other Ambulatory Visit: Payer: Self-pay | Admitting: Internal Medicine

## 2021-05-08 DIAGNOSIS — N401 Enlarged prostate with lower urinary tract symptoms: Secondary | ICD-10-CM

## 2021-05-08 DIAGNOSIS — I10 Essential (primary) hypertension: Secondary | ICD-10-CM

## 2021-05-08 DIAGNOSIS — E785 Hyperlipidemia, unspecified: Secondary | ICD-10-CM

## 2021-05-08 DIAGNOSIS — E118 Type 2 diabetes mellitus with unspecified complications: Secondary | ICD-10-CM

## 2021-05-12 ENCOUNTER — Telehealth: Payer: Self-pay | Admitting: Neurology

## 2021-05-12 DIAGNOSIS — I129 Hypertensive chronic kidney disease with stage 1 through stage 4 chronic kidney disease, or unspecified chronic kidney disease: Secondary | ICD-10-CM | POA: Diagnosis not present

## 2021-05-12 DIAGNOSIS — N1832 Chronic kidney disease, stage 3b: Secondary | ICD-10-CM | POA: Diagnosis not present

## 2021-05-12 DIAGNOSIS — N2581 Secondary hyperparathyroidism of renal origin: Secondary | ICD-10-CM | POA: Diagnosis not present

## 2021-05-12 DIAGNOSIS — R338 Other retention of urine: Secondary | ICD-10-CM | POA: Diagnosis not present

## 2021-05-12 DIAGNOSIS — D631 Anemia in chronic kidney disease: Secondary | ICD-10-CM | POA: Diagnosis not present

## 2021-05-12 NOTE — Telephone Encounter (Signed)
Pt's wife called in stating the patient's cognition has "really gone down hill". I scheduled her for a follow up on 12/10/21 and put him on the wait list, but was not sure if maybe Dr. Tomi Likens would want to have him see Clarise Cruz before May?

## 2021-05-13 ENCOUNTER — Telehealth: Payer: Medicare Other

## 2021-05-13 NOTE — Telephone Encounter (Signed)
Pt wife advised that he can see sara.  Front will call to schedule the appt.

## 2021-05-13 NOTE — Telephone Encounter (Signed)
Pt wife c/o pt memory has gotten worse since the last time he was seen in June. The wife is worried.  Frank [name]

## 2021-05-13 NOTE — Telephone Encounter (Signed)
Called patient but could not leave a VM and no answer

## 2021-05-14 ENCOUNTER — Ambulatory Visit (INDEPENDENT_AMBULATORY_CARE_PROVIDER_SITE_OTHER): Payer: Medicare Other

## 2021-05-14 DIAGNOSIS — I441 Atrioventricular block, second degree: Secondary | ICD-10-CM | POA: Diagnosis not present

## 2021-05-14 LAB — CUP PACEART REMOTE DEVICE CHECK
Battery Remaining Longevity: 141 mo
Battery Voltage: 3.06 V
Brady Statistic AP VP Percent: 0.81 %
Brady Statistic AP VS Percent: 0 %
Brady Statistic AS VP Percent: 98.87 %
Brady Statistic AS VS Percent: 0.32 %
Brady Statistic RA Percent Paced: 0.88 %
Brady Statistic RV Percent Paced: 99.68 %
Date Time Interrogation Session: 20221102034815
Implantable Lead Implant Date: 20211102
Implantable Lead Implant Date: 20211102
Implantable Lead Location: 753859
Implantable Lead Location: 753860
Implantable Lead Model: 3830
Implantable Lead Model: 5076
Implantable Pulse Generator Implant Date: 20211102
Lead Channel Impedance Value: 342 Ohm
Lead Channel Impedance Value: 361 Ohm
Lead Channel Impedance Value: 418 Ohm
Lead Channel Impedance Value: 456 Ohm
Lead Channel Pacing Threshold Amplitude: 0.5 V
Lead Channel Pacing Threshold Amplitude: 1 V
Lead Channel Pacing Threshold Pulse Width: 0.4 ms
Lead Channel Pacing Threshold Pulse Width: 0.4 ms
Lead Channel Sensing Intrinsic Amplitude: 4 mV
Lead Channel Sensing Intrinsic Amplitude: 4 mV
Lead Channel Sensing Intrinsic Amplitude: 7.5 mV
Lead Channel Sensing Intrinsic Amplitude: 7.5 mV
Lead Channel Setting Pacing Amplitude: 1.5 V
Lead Channel Setting Pacing Amplitude: 2 V
Lead Channel Setting Pacing Pulse Width: 0.4 ms
Lead Channel Setting Sensing Sensitivity: 1.2 mV

## 2021-05-15 NOTE — Telephone Encounter (Signed)
Called and left a VM to call back and schedule w/ Clarise Cruz.

## 2021-05-19 ENCOUNTER — Encounter (HOSPITAL_COMMUNITY): Payer: Self-pay | Admitting: Emergency Medicine

## 2021-05-19 ENCOUNTER — Inpatient Hospital Stay (HOSPITAL_COMMUNITY)
Admission: EM | Admit: 2021-05-19 | Discharge: 2021-05-21 | DRG: 698 | Disposition: A | Discharge status: Home / Self Care | Payer: Medicare Other | Attending: Internal Medicine | Admitting: Internal Medicine

## 2021-05-19 ENCOUNTER — Other Ambulatory Visit: Payer: Self-pay

## 2021-05-19 DIAGNOSIS — Z823 Family history of stroke: Secondary | ICD-10-CM | POA: Diagnosis not present

## 2021-05-19 DIAGNOSIS — Z20822 Contact with and (suspected) exposure to covid-19: Secondary | ICD-10-CM | POA: Diagnosis not present

## 2021-05-19 DIAGNOSIS — G4730 Sleep apnea, unspecified: Secondary | ICD-10-CM | POA: Diagnosis present

## 2021-05-19 DIAGNOSIS — N400 Enlarged prostate without lower urinary tract symptoms: Secondary | ICD-10-CM | POA: Diagnosis present

## 2021-05-19 DIAGNOSIS — G4733 Obstructive sleep apnea (adult) (pediatric): Secondary | ICD-10-CM | POA: Diagnosis not present

## 2021-05-19 DIAGNOSIS — K5900 Constipation, unspecified: Secondary | ICD-10-CM | POA: Diagnosis present

## 2021-05-19 DIAGNOSIS — I959 Hypotension, unspecified: Secondary | ICD-10-CM | POA: Diagnosis not present

## 2021-05-19 DIAGNOSIS — Z794 Long term (current) use of insulin: Secondary | ICD-10-CM

## 2021-05-19 DIAGNOSIS — R531 Weakness: Secondary | ICD-10-CM

## 2021-05-19 DIAGNOSIS — D649 Anemia, unspecified: Secondary | ICD-10-CM | POA: Diagnosis not present

## 2021-05-19 DIAGNOSIS — Z87820 Personal history of traumatic brain injury: Secondary | ICD-10-CM | POA: Diagnosis not present

## 2021-05-19 DIAGNOSIS — R319 Hematuria, unspecified: Secondary | ICD-10-CM

## 2021-05-19 DIAGNOSIS — Z8249 Family history of ischemic heart disease and other diseases of the circulatory system: Secondary | ICD-10-CM

## 2021-05-19 DIAGNOSIS — G9341 Metabolic encephalopathy: Secondary | ICD-10-CM | POA: Diagnosis not present

## 2021-05-19 DIAGNOSIS — Z743 Need for continuous supervision: Secondary | ICD-10-CM | POA: Diagnosis not present

## 2021-05-19 DIAGNOSIS — I63239 Cerebral infarction due to unspecified occlusion or stenosis of unspecified carotid arteries: Secondary | ICD-10-CM | POA: Diagnosis present

## 2021-05-19 DIAGNOSIS — E785 Hyperlipidemia, unspecified: Secondary | ICD-10-CM | POA: Diagnosis not present

## 2021-05-19 DIAGNOSIS — Z950 Presence of cardiac pacemaker: Secondary | ICD-10-CM

## 2021-05-19 DIAGNOSIS — Z8673 Personal history of transient ischemic attack (TIA), and cerebral infarction without residual deficits: Secondary | ICD-10-CM | POA: Diagnosis not present

## 2021-05-19 DIAGNOSIS — R296 Repeated falls: Secondary | ICD-10-CM | POA: Diagnosis present

## 2021-05-19 DIAGNOSIS — A4152 Sepsis due to Pseudomonas: Secondary | ICD-10-CM | POA: Diagnosis present

## 2021-05-19 DIAGNOSIS — A419 Sepsis, unspecified organism: Secondary | ICD-10-CM | POA: Diagnosis present

## 2021-05-19 DIAGNOSIS — N401 Enlarged prostate with lower urinary tract symptoms: Secondary | ICD-10-CM | POA: Diagnosis not present

## 2021-05-19 DIAGNOSIS — I129 Hypertensive chronic kidney disease with stage 1 through stage 4 chronic kidney disease, or unspecified chronic kidney disease: Secondary | ICD-10-CM | POA: Diagnosis present

## 2021-05-19 DIAGNOSIS — N179 Acute kidney failure, unspecified: Secondary | ICD-10-CM | POA: Diagnosis present

## 2021-05-19 DIAGNOSIS — Z7189 Other specified counseling: Secondary | ICD-10-CM | POA: Diagnosis not present

## 2021-05-19 DIAGNOSIS — Z6825 Body mass index (BMI) 25.0-25.9, adult: Secondary | ICD-10-CM

## 2021-05-19 DIAGNOSIS — T83511A Infection and inflammatory reaction due to indwelling urethral catheter, initial encounter: Secondary | ICD-10-CM | POA: Diagnosis not present

## 2021-05-19 DIAGNOSIS — Z803 Family history of malignant neoplasm of breast: Secondary | ICD-10-CM

## 2021-05-19 DIAGNOSIS — Z66 Do not resuscitate: Secondary | ICD-10-CM | POA: Diagnosis not present

## 2021-05-19 DIAGNOSIS — E1122 Type 2 diabetes mellitus with diabetic chronic kidney disease: Secondary | ICD-10-CM | POA: Diagnosis not present

## 2021-05-19 DIAGNOSIS — R0902 Hypoxemia: Secondary | ICD-10-CM | POA: Diagnosis not present

## 2021-05-19 DIAGNOSIS — N390 Urinary tract infection, site not specified: Principal | ICD-10-CM | POA: Diagnosis present

## 2021-05-19 DIAGNOSIS — N1832 Chronic kidney disease, stage 3b: Secondary | ICD-10-CM | POA: Diagnosis not present

## 2021-05-19 DIAGNOSIS — Z95820 Peripheral vascular angioplasty status with implants and grafts: Secondary | ICD-10-CM

## 2021-05-19 DIAGNOSIS — Z79899 Other long term (current) drug therapy: Secondary | ICD-10-CM

## 2021-05-19 DIAGNOSIS — Z882 Allergy status to sulfonamides status: Secondary | ICD-10-CM

## 2021-05-19 DIAGNOSIS — Z7901 Long term (current) use of anticoagulants: Secondary | ICD-10-CM

## 2021-05-19 DIAGNOSIS — Z880 Allergy status to penicillin: Secondary | ICD-10-CM

## 2021-05-19 DIAGNOSIS — E1142 Type 2 diabetes mellitus with diabetic polyneuropathy: Secondary | ICD-10-CM | POA: Diagnosis not present

## 2021-05-19 DIAGNOSIS — N138 Other obstructive and reflux uropathy: Secondary | ICD-10-CM

## 2021-05-19 DIAGNOSIS — Z7982 Long term (current) use of aspirin: Secondary | ICD-10-CM

## 2021-05-19 DIAGNOSIS — I459 Conduction disorder, unspecified: Secondary | ICD-10-CM | POA: Diagnosis present

## 2021-05-19 DIAGNOSIS — I441 Atrioventricular block, second degree: Secondary | ICD-10-CM | POA: Diagnosis present

## 2021-05-19 DIAGNOSIS — R6889 Other general symptoms and signs: Secondary | ICD-10-CM | POA: Diagnosis not present

## 2021-05-19 DIAGNOSIS — N1831 Chronic kidney disease, stage 3a: Secondary | ICD-10-CM | POA: Diagnosis present

## 2021-05-19 DIAGNOSIS — R54 Age-related physical debility: Secondary | ICD-10-CM | POA: Diagnosis present

## 2021-05-19 DIAGNOSIS — R404 Transient alteration of awareness: Secondary | ICD-10-CM | POA: Diagnosis not present

## 2021-05-19 DIAGNOSIS — N183 Chronic kidney disease, stage 3 unspecified: Secondary | ICD-10-CM | POA: Diagnosis present

## 2021-05-19 DIAGNOSIS — Z7984 Long term (current) use of oral hypoglycemic drugs: Secondary | ICD-10-CM

## 2021-05-19 DIAGNOSIS — Z888 Allergy status to other drugs, medicaments and biological substances status: Secondary | ICD-10-CM

## 2021-05-19 DIAGNOSIS — Z993 Dependence on wheelchair: Secondary | ICD-10-CM

## 2021-05-19 DIAGNOSIS — I499 Cardiac arrhythmia, unspecified: Secondary | ICD-10-CM | POA: Diagnosis not present

## 2021-05-19 DIAGNOSIS — I10 Essential (primary) hypertension: Secondary | ICD-10-CM | POA: Diagnosis not present

## 2021-05-19 HISTORY — DX: Sepsis, unspecified organism: A41.9

## 2021-05-19 LAB — COMPREHENSIVE METABOLIC PANEL
ALT: 18 U/L (ref 0–44)
AST: 18 U/L (ref 15–41)
Albumin: 3.2 g/dL — ABNORMAL LOW (ref 3.5–5.0)
Alkaline Phosphatase: 70 U/L (ref 38–126)
Anion gap: 11 (ref 5–15)
BUN: 26 mg/dL — ABNORMAL HIGH (ref 8–23)
CO2: 23 mmol/L (ref 22–32)
Calcium: 8.9 mg/dL (ref 8.9–10.3)
Chloride: 102 mmol/L (ref 98–111)
Creatinine, Ser: 1.56 mg/dL — ABNORMAL HIGH (ref 0.61–1.24)
GFR, Estimated: 44 mL/min — ABNORMAL LOW (ref >60)
Glucose, Bld: 183 mg/dL — ABNORMAL HIGH (ref 70–99)
Potassium: 4.1 mmol/L (ref 3.5–5.1)
Sodium: 136 mmol/L (ref 135–145)
Total Bilirubin: 0.8 mg/dL (ref 0.3–1.2)
Total Protein: 6.5 g/dL (ref 6.5–8.1)

## 2021-05-19 LAB — CBC WITH DIFFERENTIAL/PLATELET
Abs Immature Granulocytes: 0.09 10*3/uL — ABNORMAL HIGH (ref 0.00–0.07)
Basophils Absolute: 0.1 10*3/uL (ref 0.0–0.1)
Basophils Relative: 1 %
Eosinophils Absolute: 0.1 10*3/uL (ref 0.0–0.5)
Eosinophils Relative: 1 %
HCT: 38 % — ABNORMAL LOW (ref 39.0–52.0)
Hemoglobin: 12.4 g/dL — ABNORMAL LOW (ref 13.0–17.0)
Immature Granulocytes: 1 %
Lymphocytes Relative: 7 %
Lymphs Abs: 1.3 10*3/uL (ref 0.7–4.0)
MCH: 26.9 pg (ref 26.0–34.0)
MCHC: 32.6 g/dL (ref 30.0–36.0)
MCV: 82.4 fL (ref 80.0–100.0)
Monocytes Absolute: 1.6 10*3/uL — ABNORMAL HIGH (ref 0.1–1.0)
Monocytes Relative: 9 %
Neutro Abs: 14.6 10*3/uL — ABNORMAL HIGH (ref 1.7–7.7)
Neutrophils Relative %: 81 %
Platelets: 189 10*3/uL (ref 150–400)
RBC: 4.61 MIL/uL (ref 4.22–5.81)
RDW: 13.4 % (ref 11.5–15.5)
WBC: 17.7 10*3/uL — ABNORMAL HIGH (ref 4.0–10.5)
nRBC: 0 % (ref 0.0–0.2)

## 2021-05-19 LAB — LACTIC ACID, PLASMA
Lactic Acid, Venous: 2.1 mmol/L (ref 0.5–1.9)
Lactic Acid, Venous: 1.8 mmol/L (ref 0.5–1.9)

## 2021-05-19 LAB — URINALYSIS, ROUTINE W REFLEX MICROSCOPIC
Bilirubin Urine: NEGATIVE
Glucose, UA: NEGATIVE mg/dL
Ketones, ur: NEGATIVE mg/dL
Nitrite: NEGATIVE
Protein, ur: 100 mg/dL — AB
Specific Gravity, Urine: 1.015 (ref 1.005–1.030)
WBC, UA: >50 WBC/hpf — ABNORMAL HIGH (ref 0–5)
pH: 7 (ref 5.0–8.0)

## 2021-05-19 LAB — RESP PANEL BY RT-PCR (FLU A&B, COVID) ARPGX2
Influenza A by PCR: NEGATIVE
Influenza B by PCR: NEGATIVE
SARS Coronavirus 2 by RT PCR: NEGATIVE

## 2021-05-19 LAB — CBG MONITORING, ED
Glucose-Capillary: 108 mg/dL — ABNORMAL HIGH (ref 70–99)
Glucose-Capillary: 134 mg/dL — ABNORMAL HIGH (ref 70–99)
Glucose-Capillary: 92 mg/dL (ref 70–99)

## 2021-05-19 LAB — HEMOGLOBIN A1C
Hgb A1c MFr Bld: 7.1 % — ABNORMAL HIGH (ref 4.8–5.6)
Mean Plasma Glucose: 157.07 mg/dL

## 2021-05-19 MED ORDER — ENOXAPARIN SODIUM 40 MG/0.4ML IJ SOSY
40.0000 mg | PREFILLED_SYRINGE | INTRAMUSCULAR | Status: DC
  Administered 2021-05-19 – 2021-05-21 (×3): 40 mg via SUBCUTANEOUS
  Filled 2021-05-19 (×3): qty 0.4

## 2021-05-19 MED ORDER — TICAGRELOR 90 MG PO TABS
45.0000 mg | ORAL_TABLET | Freq: Two times a day (BID) | ORAL | Status: DC
  Administered 2021-05-19 – 2021-05-21 (×5): 45 mg via ORAL
  Filled 2021-05-19 (×5): qty 1

## 2021-05-19 MED ORDER — POLYETHYLENE GLYCOL 3350 17 G PO PACK: 17.0000 g | PACK | Freq: Every day | ORAL | Status: DC | PRN

## 2021-05-19 MED ORDER — AQUAPHOR EX OINT
1.0000 "application " | TOPICAL_OINTMENT | CUTANEOUS | Status: DC | PRN
  Filled 2021-05-19: qty 50

## 2021-05-19 MED ORDER — SODIUM CHLORIDE 0.9 % IV SOLN: 2.0000 g | INTRAVENOUS | Status: DC

## 2021-05-19 MED ORDER — BOOST PO LIQD
237.0000 mL | Freq: Two times a day (BID) | ORAL | Status: DC
  Administered 2021-05-20 – 2021-05-21 (×3): 237 mL via ORAL
  Filled 2021-05-19 (×4): qty 237

## 2021-05-19 MED ORDER — ACETAMINOPHEN 325 MG PO TABS
650.0000 mg | ORAL_TABLET | Freq: Once | ORAL | Status: AC
  Administered 2021-05-19: 650 mg via ORAL
  Filled 2021-05-19: qty 2

## 2021-05-19 MED ORDER — SODIUM CHLORIDE 0.9 % IV SOLN
1.0000 g | Freq: Once | INTRAVENOUS | Status: AC
  Administered 2021-05-19: 1 g via INTRAVENOUS
  Filled 2021-05-19: qty 10

## 2021-05-19 MED ORDER — SODIUM CHLORIDE 0.9 % IV BOLUS
500.0000 mL | Freq: Once | INTRAVENOUS | Status: AC
  Administered 2021-05-19: 500 mL via INTRAVENOUS

## 2021-05-19 MED ORDER — DARIFENACIN HYDROBROMIDE ER 15 MG PO TB24
15.0000 mg | ORAL_TABLET | Freq: Every day | ORAL | Status: DC
  Administered 2021-05-20 – 2021-05-21 (×2): 15 mg via ORAL
  Filled 2021-05-19 (×3): qty 1

## 2021-05-19 MED ORDER — LEVETIRACETAM 500 MG PO TABS
500.0000 mg | ORAL_TABLET | Freq: Two times a day (BID) | ORAL | Status: DC
  Administered 2021-05-19 – 2021-05-21 (×5): 500 mg via ORAL
  Filled 2021-05-19 (×5): qty 1

## 2021-05-19 MED ORDER — FLUOXETINE HCL 20 MG PO CAPS
20.0000 mg | ORAL_CAPSULE | Freq: Every day | ORAL | Status: DC
  Administered 2021-05-19 – 2021-05-21 (×3): 20 mg via ORAL
  Filled 2021-05-19 (×3): qty 1

## 2021-05-19 MED ORDER — INSULIN GLARGINE-YFGN 100 UNIT/ML ~~LOC~~ SOLN
8.0000 [IU] | Freq: Every day | SUBCUTANEOUS | Status: DC
  Administered 2021-05-19: 8 [IU] via SUBCUTANEOUS
  Filled 2021-05-19 (×2): qty 0.08

## 2021-05-19 MED ORDER — ACETAMINOPHEN 650 MG RE SUPP: 650.0000 mg | Freq: Four times a day (QID) | RECTAL | Status: DC | PRN

## 2021-05-19 MED ORDER — INSULIN ASPART 100 UNIT/ML IJ SOLN
0.0000 [IU] | Freq: Three times a day (TID) | INTRAMUSCULAR | Status: DC
  Administered 2021-05-19: 1 [IU] via SUBCUTANEOUS
  Administered 2021-05-20: 2 [IU] via SUBCUTANEOUS
  Administered 2021-05-20: 3 [IU] via SUBCUTANEOUS
  Administered 2021-05-21: 1 [IU] via SUBCUTANEOUS
  Administered 2021-05-21: 2 [IU] via SUBCUTANEOUS

## 2021-05-19 MED ORDER — ACETAMINOPHEN 325 MG PO TABS: 650.0000 mg | ORAL_TABLET | Freq: Four times a day (QID) | ORAL | Status: DC | PRN

## 2021-05-19 MED ORDER — FERROUS GLUCONATE 324 (38 FE) MG PO TABS
324.0000 mg | ORAL_TABLET | Freq: Two times a day (BID) | ORAL | Status: DC
  Administered 2021-05-19 – 2021-05-21 (×4): 324 mg via ORAL
  Filled 2021-05-19 (×6): qty 1

## 2021-05-19 MED ORDER — ASPIRIN 81 MG PO CHEW
81.0000 mg | CHEWABLE_TABLET | Freq: Every day | ORAL | Status: DC
  Administered 2021-05-19 – 2021-05-21 (×3): 81 mg via ORAL
  Filled 2021-05-19 (×3): qty 1

## 2021-05-19 MED ORDER — ALBUTEROL SULFATE (2.5 MG/3ML) 0.083% IN NEBU: 2.5000 mg | INHALATION_SOLUTION | Freq: Four times a day (QID) | RESPIRATORY_TRACT | Status: DC | PRN

## 2021-05-19 MED ORDER — SODIUM CHLORIDE 0.9 % IV SOLN: INTRAVENOUS | Status: DC

## 2021-05-19 MED ORDER — ATORVASTATIN CALCIUM 10 MG PO TABS
5.0000 mg | ORAL_TABLET | Freq: Every day | ORAL | Status: DC
  Administered 2021-05-19 – 2021-05-21 (×3): 5 mg via ORAL
  Filled 2021-05-19 (×3): qty 1

## 2021-05-19 MED ORDER — SODIUM CHLORIDE 0.9% FLUSH
3.0000 mL | Freq: Two times a day (BID) | INTRAVENOUS | Status: DC
  Administered 2021-05-19 – 2021-05-20 (×3): 3 mL via INTRAVENOUS

## 2021-05-19 MED ORDER — DOCUSATE SODIUM 100 MG PO CAPS
100.0000 mg | ORAL_CAPSULE | Freq: Two times a day (BID) | ORAL | Status: DC
  Administered 2021-05-19 – 2021-05-21 (×4): 100 mg via ORAL
  Filled 2021-05-19 (×4): qty 1

## 2021-05-19 NOTE — H&P (Signed)
History and Physical    Frank [name] VHQ:[id number] DOB: July 11, 1941 DOA: 05/19/2021  Referring MD/NP/PA: Thamas Jaegers, MD PCP: Frank Lima, MD  Patient coming from: Home via Ems  Chief Complaint: Weakness and fever  I have personally briefly reviewed patient's old medical records in [place]   HPI: Frank [name] is a 80 y.o. 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  Patient appears to be a little confused and therefore history is obtained mostly from his wife over the phone.  His wife notes that she was awakened around 1:30 AM by the patient's nurse aide that the patient had gotten up to use the restroom, but was unable to sit up on his own due to being too weak.  At baseline had been able to stand and pivot, but had been getting around mostly in a wheelchair due to multiple falls since recent carotid artery stent placement.  Prior to that he had been able to use a Rollator.  His wife checked his temperature around 3 AM and noted that it was elevated at 101 F and thereafter called EMS.  She reports that he seemed to be disoriented similar to when was hospitalized back in May with a urinary tract infection.  ED due to penile pain on 10/8 and noted to have concern for urinary tract infection which was treated with cefuroxime.  Cultures at that time were positive for MSSA.  He has had a indwelling Foley, that was last changed out on 10/31.  He is followed by Dr. Amalia Hailey of urology at [facility] hospital with plans for suprapubic catheter placement and cystoscopy for urinary retention.  His wife notes that over the last year he lost 21 pounds and since August he had not been using his CPAP at night.  Patient denies any complaints at this time.  His wife  also reports that the patient longer requires a modified diet.   ED Course: Upon admission into the emergency department patient was noted to have a temperature of 100.1 F, pulse elevated up to 104, respirations 17-27, blood pressure 99/56-129/81, and O2 saturation maintained on room air.  Labs significant for WBC 17.8, hemoglobin 12.4, BUN 26, creatinine 1.56, glucose 183, lactic acid 2.1-> 1.8.  Blood and urine cultures have been obtained.  Patient has been given 500 mL of normal saline IV fluids, Rocephin IV, and 650 mg of Tylenol.  Influenza and COVID-19 screening was pending.  TRH called to admit.  Review of Systems  Unable to perform ROS: Mental status change  Constitutional:  Positive for fever and malaise/fatigue.  HENT:  Negative for congestion.   Eyes:  Negative for photophobia and pain.  Respiratory:  Negative for cough and shortness of breath.   Cardiovascular:  Negative for chest pain and leg swelling.  Gastrointestinal:  Negative for abdominal pain, diarrhea, nausea and vomiting.  Neurological:  Positive for weakness.  Endo/Heme/Allergies:  Bruises/bleeds easily.  Psychiatric/Behavioral:  Negative for substance abuse.   All other systems reviewed and are negative.  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, Ischemic optic   OSA (obstructive sleep apnea)    cpap   Presence of permanent cardiac pacemaker    TBI (traumatic brain injury)     Past Surgical History:  Procedure Laterality Date   APPENDECTOMY     arm fracture Left 2011   with hardware   BURR HOLE Bilateral 08/04/2017   Procedure: Haskell Flirt;  Surgeon: [name], MD;  Location: [place];  Service: Neurosurgery;  Laterality: Bilateral;   COLONOSCOPY      CRANIOTOMY Left 12/01/2017   Procedure: Trudee Kuster Holes CRANIOTOMY HEMATOMA EVACUATION SUBDURAL;  Surgeon: [name], MD;  Location: [place];  Service: Neurosurgery;  Laterality: Left;   ESOPHAGOGASTRODUODENOSCOPY  2006   gastritis   IR ANGIO INTRA EXTRACRAN SEL COM CAROTID INNOMINATE UNI L MOD SED  03/31/2021   IR ANGIO VERTEBRAL SEL SUBCLAVIAN INNOMINATE UNI R MOD SED  03/31/2021   IR CT HEAD LTD  03/31/2021   IR INTRAVSC STENT CERV CAROTID W/EMB-PROT MOD SED INCL ANGIO  03/31/2021   IR PERCUTANEOUS ART THROMBECTOMY/INFUSION INTRACRANIAL INC DIAG ANGIO  09/09/2020   IR RADIOLOGIST EVAL & MGMT  03/10/2021   IR RADIOLOGIST EVAL & MGMT  04/28/2021   PACEMAKER IMPLANT N/A 05/14/2020   Procedure: PACEMAKER IMPLANT;  Surgeon: [name], MD;  Location: [place] [facility];  Service: Cardiovascular;  Laterality: N/A;   RADIOLOGY WITH ANESTHESIA N/A 09/09/2020   Procedure: IR WITH ANESTHESIA;  Surgeon: Radiologist, Medication, MD;  Location: [place];  Service: Radiology;  Laterality: N/A;   RADIOLOGY WITH ANESTHESIA N/A 03/31/2021   Procedure: IR WITH ANESTHESIA STENT PLACEMENT;  Surgeon: Luanne Bras, MD;  Location: [place];  Service: Radiology;  Laterality: N/A;   ROTATOR CUFF REPAIR     SEPTOPLASTY  1959   Deviated Septum   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 His smoking use included cigarettes. He smoked an average of 1 pack per day. He has never used smokeless tobacco. He reports current alcohol use of about 2.0 standard drinks per week. He reports that he does not use drugs.  Allergies  Allergen Reactions   Lisinopril Cough   Invokana [Canagliflozin] Other (See Comments)    Stopped by MD, unknown reaction    Penicillins Other (See Comments)    Allergic as a child.  Patient does not remember reaction.  Has had cephalasporins without problems.   Sulfamethoxazole Other (See Comments)    Unknown reaction- from childhood    Family History   Problem Relation Age of Onset   Heart disease Mother    Breast cancer Mother    Stroke Father    Allergies Father        Father and children   Coronary artery disease Brother    Diabetes Neg Hx    Colon cancer Neg Hx     Prior to Admission medications   Medication Sig Start Date End Date Taking? Authorizing Provider  acetaminophen (TYLENOL) 500 MG tablet Take 500-1,000 mg by mouth every 6 (six) hours as needed for moderate pain or headache.    [provider]  amLODipine (NORVASC) 10 MG tablet Take 1 tablet (10 mg total) by mouth daily. Patient taking differently: Take 10 mg by mouth at bedtime. 09/18/20   Briant Cedar, MD  aspirin 81 MG chewable tablet Chew 1 tablet (81  mg total) by mouth daily. 09/18/20   Briant Cedar, MD  atorvastatin (LIPITOR) 10 MG tablet Take 1 tablet (10 mg total) by mouth daily. 09/18/20   Briant Cedar, MD  cefUROXime (CEFTIN) 500 MG tablet Take 1 tablet (500 mg total) by mouth 2 (two) times daily with a meal. 04/19/21   Farrel Gordon, DO  docusate sodium (COLACE) 100 MG capsule Take 1 capsule (100 mg total) by mouth 2 (two) times daily. 04/03/21   Swayze, Ava, DO  Ferric Maltol (ACCRUFER) 30 MG CAPS Take 1 capsule by mouth in the morning and at bedtime. Patient taking differently: Take 30 mg by mouth in the morning and at bedtime. 11/06/20   Frank Lima, MD  FLUoxetine (PROZAC) 20 MG capsule TAKE 1 CAPSULE BY MOUTH  DAILY Patient taking differently: Take 20 mg by mouth daily. 03/13/21   Frank Lima, MD  fluticasone (FLONASE) 50 MCG/ACT nasal spray Place 1-2 sprays into both nostrils daily as needed for allergies or rhinitis.    [provider]  glipiZIDE (GLUCOTROL XL) 10 MG 24 hr tablet TAKE 1 TABLET BY MOUTH  DAILY 03/28/21   Frank Lima, MD  insulin glargine, 2 Unit Dial, (TOUJEO MAX SOLOSTAR) 300 UNIT/ML Solostar Pen Inject 10 Units into the skin daily. 01/09/21   Frank Lima, MD  Insulin Pen Needle 32G X 6 MM  MISC 1 Act by Does not apply route daily. 04/08/20   Frank Lima, MD  levETIRAcetam (KEPPRA) 500 MG tablet Take 1 tablet (500 mg total) by mouth 2 (two) times daily. 01/19/21   Frank Lima, MD  pantoprazole (PROTONIX) 40 MG tablet TAKE 1 TABLET BY MOUTH  DAILY Patient taking differently: Take 40 mg by mouth daily. 02/19/21   Frank Lima, MD  PRESCRIPTION MEDICATION CPAP- At bedtime    [provider]  solifenacin (VESICARE) 10 MG tablet TAKE 1 TABLET BY MOUTH  DAILY Patient taking differently: Take 10 mg by mouth daily. 03/16/21   Frank Lima, MD  tamsulosin (FLOMAX) 0.4 MG CAPS capsule Take 1 capsule (0.4 mg total) by mouth daily after supper. 04/03/21   Swayze, Ava, DO  ticagrelor (BRILINTA) 90 MG TABS tablet Take 0.5 tablets (45 mg total) by mouth 2 (two) times daily. 11/14/20   Frank Lima, MD    Physical Exam:  Constitutional: Elderly male who appears to be in no acute distress at this Vitals:   05/19/21 0745 05/19/21 0800 05/19/21 0830 05/19/21 0845  BP: (!) 99/56 (!) 100/55 (!) 109/54 111/62  Pulse: 71 67 68 72  Resp: 18 (!) 21 15 19   Temp:      TempSrc:      SpO2: 96% 94% 95% 98%   Eyes: PERRL, lids and conjunctivae normal ENMT: Mucous membranes are moist. Posterior pharynx clear of any exudate or lesions.  Neck: normal, supple, no masses, no thyromegaly Respiratory: clear to auscultation bilaterally, no wheezing, no crackles. Normal respiratory effort. No accessory muscle use.  Cardiovascular: Regular rate and rhythm, no murmurs / rubs / gallops. No extremity edema. 2+ pedal pulses. No carotid bruits.  Abdomen: no tenderness, no masses palpated. No hepatosplenomegaly. Bowel sounds positive.  Genitourinary: Indwelling Foley catheter in place with dark urine. Musculoskeletal: no clubbing / cyanosis. No joint deformity upper and lower extremities. Good ROM, no contractures. Normal muscle tone.  Skin: no rashes, lesions, ulcers. No induration Neurologic: CN  2-12 grossly intact. Sensation intact, DTR normal. Strength 5/5 in all 4.  Psychiatric:  Alert and oriented to person, place, time, but not situation.    Labs on Admission: I have personally reviewed following labs and imaging studies  CBC: Recent Labs  Lab 05/19/21 0431  WBC 17.7*  NEUTROABS 14.6*  HGB 12.4*  HCT 38.0*  MCV 82.4  PLT 389   Basic Metabolic Panel: Recent Labs  Lab 05/19/21 0431  NA 136  K 4.1  CL 102  CO2 23  GLUCOSE 183*  BUN 26*  CREATININE 1.56*  CALCIUM 8.9   GFR: CrCl cannot be calculated (Unknown ideal weight.). Liver Function Tests: Recent Labs  Lab 05/19/21 0431  AST 18  ALT 18  ALKPHOS 70  BILITOT 0.8  PROT 6.5  ALBUMIN 3.2*   No results for input(s): LIPASE, AMYLASE in the last 168 hours. No results for input(s): AMMONIA in the last 168 hours. Coagulation Profile: No results for input(s): INR, PROTIME in the last 168 hours. Cardiac Enzymes: No results for input(s): CKTOTAL, CKMB, CKMBINDEX, TROPONINI in the last 168 hours. BNP (last 3 results) No results for input(s): PROBNP in the last 8760 hours. HbA1C: No results for input(s): HGBA1C in the last 72 hours. CBG: No results for input(s): GLUCAP in the last 168 hours. Lipid Profile: No results for input(s): CHOL, HDL, LDLCALC, TRIG, CHOLHDL, LDLDIRECT in the last 72 hours. Thyroid Function Tests: No results for input(s): TSH, T4TOTAL, FREET4, T3FREE, THYROIDAB in the last 72 hours. Anemia Panel: No results for input(s): VITAMINB12, FOLATE, FERRITIN, TIBC, IRON, RETICCTPCT in the last 72 hours. Urine analysis:    Component Value Date/Time   COLORURINE YELLOW 05/19/2021 0435   APPEARANCEUR CLOUDY (A) 05/19/2021 0435   LABSPEC 1.015 05/19/2021 0435   PHURINE 7.0 05/19/2021 0435   GLUCOSEU NEGATIVE 05/19/2021 0435   GLUCOSEU NEGATIVE 12/26/2020 1600   HGBUR SMALL (A) 05/19/2021 0435   BILIRUBINUR NEGATIVE 05/19/2021 0435   BILIRUBINUR n 07/19/2013 1136   KETONESUR NEGATIVE  05/19/2021 0435   PROTEINUR 100 (A) 05/19/2021 0435   UROBILINOGEN 0.2 12/26/2020 1600   NITRITE NEGATIVE 05/19/2021 0435   LEUKOCYTESUR LARGE (A) 05/19/2021 0435   Sepsis Labs: No results found for this or any previous visit (from the past 240 hour(s)).   Radiological Exams on Admission: No results found.  EKG: Independently reviewed. Sinus rhythm at 69  Assessment/Plan Sepsis secondary to complicated UTI with chronic indwelling: Patient was reported to have fever up to 100.5 F with tachycardia and tachypnea.  WBC elevated at 17.7 and initial lactic acid 2.1 with resolution after initial IV fluids.  Urinalysis noted large leukocytes, few bacteria, 11-20 RBCs, and greater than 50 WBCs.  Patient had initially been given Rocephin IV records note that previous urine culture from 10/8 was positive for Staph aureus. -Admit to a medical telemetry bed -Follow-up blood and urine culture -Continue rocephin IV for now unless cultures suggest resistance -Recheck CBC tomorrow -Tylenol as needed for fever  Acute metabolic encephalopathy: Patient was noted to be more disoriented than usual per patient's wife.  She reported similar symptoms during previous hospitalization in May where patient had urinary tract infection for which there was concern for similar today. -Continue to monitor  Chronic kidney disease stage IIIa/b: Patient presents with creatinine 1.56 with BUN 26.  Baseline creatinine previously noted to be around 1.4.  Currently not suspected to be greater than 0.3 increase from baseline to suggest an acute kidney injury. -Continue normal saline IV fluids 100 mL/h for 2 L -Recheck kidney function tomorrow  Generalized weakness: Acute  on chronic.  Following recent carotid artery stent placement patient had been mostly wheelchair-bound. -PT/OT to evaluate and treat  Diabetes mellitus type 2 with neuropathy: On admission glucose 183.  Last hemoglobin A1c was 7.7 on 02/10/2021.  Home  medication regimen includes Toujeo 10 units q. at bedtime and glipizide 10 mg daily. -Hypoglycemic protocol -Hold glipizide -Continue pharmacy substitution of Semglee reduced to 8 units -CBGs before every meal with sensitive SSI -Adjust insulin regimen as needed  Normocytic anemia: Chronic.  Hemoglobin 12.4 g/dL which appears slightly increased from previous 10.7 on 10/8.  Suspect slightly increased due to some hemoconcentration as patient appears to be slightly dehydrated.  Patient does receive vitamin B12 shots in the outpatient setting for -Follow-up repeat CBC in a.m.  Transient hypotension: On admission initial blood pressures were noted to be as low as 100/49.  Blood pressure improved after initial IV fluids.  Home blood pressure medications include amlodipine 10 mg daily. -Held amlodipine due to initial hypotension  History of CVA secondary to right carotid stenosis: Patient was noted to have symptomatic right carotid artery stenosis and underwent stent assisted angioplasty with distal dissection 9/19 by interventional radiology. -Continue aspirin, Brilinta, and statin  Heart block s/p PPM: Patient has [employer] pacemaker in place since 05/2020.  History of TBI -Continue Keppra  OSA: Patient reportedly had not been using his CPAP at night since August of this year.  Hyperlipidemia -Continue atorvastatin  BPH s/p chronic foley -Continue Vesicare  -Continue outpatient follow-up with urology to [facility]  DNR: Present on arrival  DVT prophylaxis: Lovenox Code Status: DNR Family Communication: Wife updated over the phone Disposition Plan: Likely discharge back to nursing home once medically stable Consults called: None Admission status: Inpatient for more than 2 midnight stay  Norval Morton MD [facility]   If 7PM-7AM, please contact night-coverage   05/19/2021, 9:17 AM

## 2021-05-19 NOTE — ED Provider Notes (Signed)
MSE was initiated and I personally evaluated the patient and placed orders (if any) at  4:27 AM on May 19, 2021.  Patient to ED from SNF by EMS who reports staff found him significantly weak tonight when attempt to ambulate to the bathroom. Baseline is stand/pivot, walk with assistance and walker. Per EMS, patient too weak to function at baseline. No vomiting or fever. Per EMS, baseline mental status. No complaints from patient. Has chronic, indwelling foley - tubing appears cloudy with sediment.   Today's Vitals   05/19/21 0424 05/19/21 0424  BP:  129/81  Pulse:  (!) 104  Resp:  18  Temp:  100.1 F (37.8 C)  SpO2:  95%  PainSc: 0-No pain    There is no height or weight on file to calculate BMI.  Awake, alert, in NAD. Febrile in ED, slightly tachy Given a cup of water and barely able to hold with both hands.  Appears pale Abdomen soft, nontender.   The patient appears stable so that the remainder of the MSE may be completed by another provider.   Charlann Lange, PA-C 05/19/21 0430    Ezequiel Essex, MD 05/19/21 [id number]

## 2021-05-19 NOTE — ED Triage Notes (Signed)
Patient arrived with EMS from home with fever , cloudy urine and gen, weakness onset this morning .

## 2021-05-19 NOTE — ED Provider Notes (Signed)
[facility] EMERGENCY DEPARTMENT Provider Note   CSN: [id number] Arrival date & time: 05/19/21  0418     History Chief Complaint  Patient presents with   Gen. Weakness / Poss.UTI     Frank [name] is a 80 y.o. male.  History provided by patient and his wife was at bedside.  Patient presents from nursing home with increased complaint of weakness and increased confusion.  Symptoms ongoing for the past 2 days.  No reports of fevers or cough or vomiting or diarrhea.  Patient denies any pain or discomfort.  He has a history of prior stroke, prior carotid endarterectomy, and is Foley catheter dependent.      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, Ischemic optic   OSA (obstructive sleep apnea)    cpap   Presence of permanent cardiac pacemaker    TBI (traumatic brain injury)     Patient Active Problem List   Diagnosis Date Noted   Sepsis secondary to UTI ([place]) 05/19/2021   Carotid stenosis, symptomatic, with infarction ([place]) 03/31/2021   Infection of thumb 03/26/2021   Cellulitis of left hand 03/25/2021   Atherosclerosis of aorta ([place]) 02/10/2021   Generalized weakness 12/26/2020   Simple chronic bronchitis ([place]) 11/13/2020   Iron deficiency anemia secondary to inadequate dietary iron intake 11/06/2020   DNR (do not resuscitate) discussion 10/17/2020   Stroke (cerebrum) ([place]) 09/09/2020   Middle cerebral artery embolism, 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   AV block, 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 ([place]) 11/28/2019   Chronic renal disease, stage 3, moderately decreased glomerular filtration rate (GFR) between 30-59 mL/min/1.73 square meter ([place])  11/21/2018   GERD without esophagitis 07/22/2017   Depression    Type 2 diabetes mellitus with peripheral neuropathy ([name])    OAB (overactive bladder) 11/26/2014   Routine general medical examination at a health care facility 04/06/2014   Vitamin D deficiency 07/23/2010   Obstructive sleep apnea 09/13/2008   Hyperlipidemia with target LDL less than 100 07/27/2007   BPH (benign prostatic hyperplasia) 06/21/2007   OPTIC NEUROPATHY, 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   B12 deficiency anemia 04/15/2006   Essential hypertension 04/15/2006   Allergic rhinitis 04/15/2006    Past Surgical History:  Procedure Laterality Date   APPENDECTOMY     arm fracture Left 2011   with hardware   BURR HOLE Bilateral 08/04/2017   Procedure: Haskell Flirt;  Surgeon: [name], MD;  Location: [facility];  Service: Neurosurgery;  Laterality: Bilateral;   COLONOSCOPY     CRANIOTOMY Left 12/01/2017   Procedure: Trudee Kuster Holes CRANIOTOMY HEMATOMA EVACUATION SUBDURAL;  Surgeon: [name], MD;  Location: [facility];  Service: Neurosurgery;  Laterality: Left;   ESOPHAGOGASTRODUODENOSCOPY  2006   gastritis   IR ANGIO INTRA EXTRACRAN SEL COM CAROTID INNOMINATE UNI L MOD SED  03/31/2021   IR ANGIO VERTEBRAL SEL SUBCLAVIAN INNOMINATE UNI R MOD SED  03/31/2021   IR CT HEAD LTD  03/31/2021   IR INTRAVSC STENT CERV CAROTID W/EMB-PROT MOD SED INCL ANGIO  03/31/2021   IR PERCUTANEOUS ART THROMBECTOMY/INFUSION INTRACRANIAL INC DIAG ANGIO  09/09/2020   IR  RADIOLOGIST EVAL & MGMT  03/10/2021   IR RADIOLOGIST EVAL & MGMT  04/28/2021   PACEMAKER IMPLANT N/A 05/14/2020   Procedure: PACEMAKER IMPLANT;  Surgeon: [name], MD;  Location: [facility];  Service: Cardiovascular;  Laterality: N/A;   RADIOLOGY WITH ANESTHESIA N/A 09/09/2020   Procedure: IR WITH ANESTHESIA;  Surgeon: Radiologist, Medication, MD;  Location: [facility];  Service: Radiology;  Laterality: N/A;   RADIOLOGY WITH ANESTHESIA N/A 03/31/2021   Procedure: IR WITH ANESTHESIA STENT PLACEMENT;   Surgeon: Luanne Bras, MD;  Location: [facility];  Service: Radiology;  Laterality: N/A;   ROTATOR CUFF REPAIR     SEPTOPLASTY  1959   Deviated Septum   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       Family History  Problem Relation Age of Onset   Heart disease Mother    Breast cancer Mother    Stroke Father    Allergies Father        Father and children   Coronary artery disease Brother    Diabetes Neg Hx    Colon cancer Neg Hx     Social History   Tobacco Use   Smoking status: Former    Packs/day: 1.00    Types: Cigarettes    Quit date: 07/14/1983    Years since quitting: 37.8   Smokeless tobacco: Never  Vaping Use   Vaping Use: Never used  Substance Use Topics   Alcohol use: Yes    Alcohol/week: 2.0 standard drinks    Types: 2 Standard drinks or equivalent per week    Comment: occassionally   Drug use: No    Home Medications Prior to Admission medications   Medication Sig Start Date End Date Taking? Authorizing Provider  acetaminophen (TYLENOL) 500 MG tablet Take 500-1,000 mg by mouth every 6 (six) hours as needed for moderate pain or headache.    [provider]  amLODipine (NORVASC) 10 MG tablet Take 1 tablet (10 mg total) by mouth daily. Patient taking differently: Take 10 mg by mouth at bedtime. 09/18/20   Briant Cedar, MD  aspirin 81 MG chewable tablet Chew 1 tablet (81 mg total) by mouth daily. 09/18/20   Briant Cedar, MD  atorvastatin (LIPITOR) 10 MG tablet Take 1 tablet (10 mg total) by mouth daily. 09/18/20   Briant Cedar, MD  cefUROXime (CEFTIN) 500 MG tablet Take 1 tablet (500 mg total) by mouth 2 (two) times daily with a meal. 04/19/21   Farrel Gordon, DO  docusate sodium (COLACE) 100 MG capsule Take 1 capsule (100 mg total) by mouth 2 (two) times daily. 04/03/21   Swayze, Ava, DO  Ferric Maltol (ACCRUFER) 30 MG CAPS Take 1 capsule by mouth in the morning and at bedtime. Patient taking differently: Take 30 mg by mouth in the  morning and at bedtime. 11/06/20   Janith Lima, MD  FLUoxetine (PROZAC) 20 MG capsule TAKE 1 CAPSULE BY MOUTH  DAILY Patient taking differently: Take 20 mg by mouth daily. 03/13/21   Janith Lima, MD  fluticasone (FLONASE) 50 MCG/ACT nasal spray Place 1-2 sprays into both nostrils daily as needed for allergies or rhinitis.    [provider]  glipiZIDE (GLUCOTROL XL) 10 MG 24 hr tablet TAKE 1 TABLET BY MOUTH  DAILY 03/28/21   Janith Lima, MD  insulin glargine, 2 Unit Dial, (TOUJEO MAX SOLOSTAR) 300 UNIT/ML Solostar Pen Inject 10 Units into the skin daily. 01/09/21   Janith Lima,  MD  Insulin Pen Needle 32G X 6 MM MISC 1 Act by Does not apply route daily. 04/08/20   Janith Lima, MD  levETIRAcetam (KEPPRA) 500 MG tablet Take 1 tablet (500 mg total) by mouth 2 (two) times daily. 01/19/21   Janith Lima, MD  pantoprazole (PROTONIX) 40 MG tablet TAKE 1 TABLET BY MOUTH  DAILY Patient taking differently: Take 40 mg by mouth daily. 02/19/21   Janith Lima, MD  PRESCRIPTION MEDICATION CPAP- At bedtime    [provider]  solifenacin (VESICARE) 10 MG tablet TAKE 1 TABLET BY MOUTH  DAILY Patient taking differently: Take 10 mg by mouth daily. 03/16/21   Janith Lima, MD  tamsulosin (FLOMAX) 0.4 MG CAPS capsule Take 1 capsule (0.4 mg total) by mouth daily after supper. 04/03/21   Swayze, Ava, DO  ticagrelor (BRILINTA) 90 MG TABS tablet Take 0.5 tablets (45 mg total) by mouth 2 (two) times daily. 11/14/20   Janith Lima, MD    Allergies    Lisinopril, Invokana [canagliflozin], Penicillins, and Sulfamethoxazole  Review of Systems   Review of Systems  Constitutional:  Negative for fever.  HENT:  Negative for ear pain and sore throat.   Eyes:  Negative for pain.  Respiratory:  Negative for cough.   Cardiovascular:  Negative for chest pain.  Gastrointestinal:  Negative for abdominal pain.  Genitourinary:  Negative for flank pain.  Musculoskeletal:  Negative for back  pain.  Skin:  Negative for color change and rash.  Neurological:  Negative for syncope.  All other systems reviewed and are negative.  Physical Exam Updated Vital Signs BP (!) 110/58   Pulse 69   Temp 98.3 F (36.8 C) (Oral)   Resp 19   SpO2 95%   Physical Exam Constitutional:      Appearance: He is well-developed.  HENT:     Head: Normocephalic.     Nose: Nose normal.  Eyes:     Extraocular Movements: Extraocular movements intact.  Cardiovascular:     Rate and Rhythm: Normal rate.  Pulmonary:     Effort: Pulmonary effort is normal.  Skin:    Coloration: Skin is not jaundiced.  Neurological:     Mental Status: He is alert. Mental status is at baseline.     Comments: Patient awake, alert, answers questions.  Has intermittent episodes of confusion.  Moving all extremities.  Cranial nerves II through XII intact.  No facial droop noted.  Slight weakness in the left lower extremity noted which is his baseline per family.  Strength is 4/5 in the left lower extremity.    ED Results / Procedures / Treatments   Labs (all labs ordered are listed, but only abnormal results are displayed) Labs Reviewed  CBC WITH DIFFERENTIAL/PLATELET - Abnormal; Notable for the following components:      Result Value   WBC 17.7 (*)    Hemoglobin 12.4 (*)    HCT 38.0 (*)    Neutro Abs 14.6 (*)    Monocytes Absolute 1.6 (*)    Abs Immature Granulocytes 0.09 (*)    All other components within normal limits  LACTIC ACID, PLASMA - Abnormal; Notable for the following components:   Lactic Acid, Venous 2.1 (*)    All other components within normal limits  COMPREHENSIVE METABOLIC PANEL - Abnormal; Notable for the following components:   Glucose, Bld 183 (*)    BUN 26 (*)    Creatinine, Ser 1.56 (*)    Albumin 3.2 (*)  GFR, Estimated 44 (*)    All other components within normal limits  URINALYSIS, ROUTINE W REFLEX MICROSCOPIC - Abnormal; Notable for the following components:   APPearance CLOUDY  (*)    Hgb urine dipstick SMALL (*)    Protein, ur 100 (*)    Leukocytes,Ua LARGE (*)    WBC, UA >50 (*)    Bacteria, UA FEW (*)    All other components within normal limits  URINE CULTURE  CULTURE, BLOOD (ROUTINE X 2)  CULTURE, BLOOD (ROUTINE X 2)  RESP PANEL BY RT-PCR (FLU A&B, COVID) [id number]  LACTIC ACID, PLASMA    EKG EKG Interpretation  Date/Time:  Monday May 19 2021 08:42:57 EST Ventricular Rate:  69 PR Interval:  295 QRS Duration: 128 QT Interval:  458 QTC Calculation: 491 R Axis:   -71 Text Interpretation: Sinus rhythm Prolonged PR interval IVCD, consider atypical RBBB Inferior infarct, old Confirmed by Thamas Jaegers ([id number]) on 05/19/2021 9:06:20 AM  Radiology No results found.  Procedures Procedures   Medications Ordered in ED Medications  sodium chloride 0.9 % bolus 500 mL (0 mLs Intravenous Stopped 05/19/21 0833)  acetaminophen (TYLENOL) tablet 650 mg (650 mg Oral Given 05/19/21 0431)  cefTRIAXone (ROCEPHIN) 1 g in sodium chloride 0.9 % 100 mL IVPB (0 g Intravenous Stopped 05/19/21 4585)    ED Course  I have reviewed the triage vital signs and the nursing notes.  Pertinent labs & imaging results that were available during my care of the patient were reviewed by me and considered in my medical decision making (see chart for details).    MDM Rules/Calculators/A&P                           Labs are sent patient has elevated white count 17, lactic acid mildly elevated 2.4.  Urinalysis positive for large leukocytes bacteria and WBCs greater than 50.  He is Foley catheter dependent but given fevers and symptoms suspect urinary tract infection.  Patient had blood culture sent, urine culture sent, started on IV Rocephin.  Given 500 cc fluid bolus with improvement of lactic acid levels.  Will be admitted to the hospitalist team.  Final Clinical Impression(s) / ED Diagnoses Final diagnoses:  Urinary tract infection with hematuria, site unspecified    Rx / DC  Orders ED Discharge Orders     None        Luna Fuse, MD 05/19/21 [phone]

## 2021-05-19 NOTE — ED Notes (Signed)
Pt called out with complaint of constipation. MD made aware. Waiting for orders at this time.

## 2021-05-20 ENCOUNTER — Ambulatory Visit: Payer: Medicare Other | Admitting: Physician Assistant

## 2021-05-20 DIAGNOSIS — A419 Sepsis, unspecified organism: Secondary | ICD-10-CM | POA: Diagnosis not present

## 2021-05-20 DIAGNOSIS — N390 Urinary tract infection, site not specified: Secondary | ICD-10-CM | POA: Diagnosis not present

## 2021-05-20 LAB — URINE CULTURE: Culture: >=100000 — AB

## 2021-05-20 LAB — BASIC METABOLIC PANEL
Anion gap: 9 (ref 5–15)
BUN: 19 mg/dL (ref 8–23)
CO2: 25 mmol/L (ref 22–32)
Calcium: 8.3 mg/dL — ABNORMAL LOW (ref 8.9–10.3)
Chloride: 101 mmol/L (ref 98–111)
Creatinine, Ser: 1.17 mg/dL (ref 0.61–1.24)
GFR, Estimated: >60 mL/min (ref >60)
Glucose, Bld: 60 mg/dL — ABNORMAL LOW (ref 70–99)
Potassium: 3.5 mmol/L (ref 3.5–5.1)
Sodium: 135 mmol/L (ref 135–145)

## 2021-05-20 LAB — CBC
HCT: 32.9 % — ABNORMAL LOW (ref 39.0–52.0)
Hemoglobin: 10.4 g/dL — ABNORMAL LOW (ref 13.0–17.0)
MCH: 26.6 pg (ref 26.0–34.0)
MCHC: 31.6 g/dL (ref 30.0–36.0)
MCV: 84.1 fL (ref 80.0–100.0)
Platelets: 164 10*3/uL (ref 150–400)
RBC: 3.91 MIL/uL — ABNORMAL LOW (ref 4.22–5.81)
RDW: 13.4 % (ref 11.5–15.5)
WBC: 12.4 10*3/uL — ABNORMAL HIGH (ref 4.0–10.5)
nRBC: 0 % (ref 0.0–0.2)

## 2021-05-20 LAB — CBG MONITORING, ED
Glucose-Capillary: 59 mg/dL — ABNORMAL LOW (ref 70–99)
Glucose-Capillary: 156 mg/dL — ABNORMAL HIGH (ref 70–99)

## 2021-05-20 LAB — GLUCOSE, CAPILLARY
Glucose-Capillary: 222 mg/dL — ABNORMAL HIGH (ref 70–99)
Glucose-Capillary: 164 mg/dL — ABNORMAL HIGH (ref 70–99)

## 2021-05-20 MED ORDER — INSULIN GLARGINE-YFGN 100 UNIT/ML ~~LOC~~ SOLN
5.0000 [IU] | Freq: Every day | SUBCUTANEOUS | Status: DC
  Administered 2021-05-20: 5 [IU] via SUBCUTANEOUS
  Filled 2021-05-20 (×2): qty 0.05

## 2021-05-20 MED ORDER — SODIUM CHLORIDE 0.9 % IV SOLN
2.0000 g | Freq: Three times a day (TID) | INTRAVENOUS | Status: DC
  Administered 2021-05-20 – 2021-05-21 (×5): 2 g via INTRAVENOUS
  Filled 2021-05-20 (×5): qty 2

## 2021-05-20 MED ORDER — CHLORHEXIDINE GLUCONATE CLOTH 2 % EX PADS
6.0000 | MEDICATED_PAD | Freq: Every day | CUTANEOUS | Status: DC
  Administered 2021-05-20 – 2021-05-21 (×2): 6 via TOPICAL

## 2021-05-20 MED ORDER — TAMSULOSIN HCL 0.4 MG PO CAPS
0.4000 mg | ORAL_CAPSULE | Freq: Every day | ORAL | Status: DC
  Administered 2021-05-20 – 2021-05-21 (×2): 0.4 mg via ORAL
  Filled 2021-05-20 (×2): qty 1

## 2021-05-20 NOTE — ED Notes (Signed)
Report was received by 89M RN, transport RN's aware & ready for transport.

## 2021-05-20 NOTE — ED Notes (Signed)
This RN called to give report for the pt's Ready bed.

## 2021-05-20 NOTE — Progress Notes (Signed)
NEW ADMISSION NOTE New Admission Note:   Arrival Method: Patient arrived from ED on stretcher. Mental Orientation: alert and oriented x 4. Telemetry: 65M-10 paced Assessment: Completed Skin: ecchymosis, dry and intact. IV: Left FA infusing NS @50  ml/hr Pain: denies any pain. Tubes: Chronic foley and foley was placed before admission. Safety Measures: Safety Fall Prevention Plan has been given, discussed and signed Admission: in process [facility] Orientation: Patient has been orientated to the room, unit and staff.  Family: at bedside  Orders have been reviewed and implemented. Will continue to monitor the patient. Call light has been placed within reach and bed alarm has been activated.   Amaryllis Dyke, RN

## 2021-05-20 NOTE — Progress Notes (Signed)
Inpatient Diabetes Program Recommendations  AACE/ADA: New Consensus Statement on Inpatient Glycemic Control (2015)  Target Ranges:  Prepandial:   less than 140 mg/dL      Peak postprandial:   less than 180 mg/dL (1-2 hours)      Critically ill patients:  140 - 180 mg/dL   Lab Results  Component Value Date   GLUCAP 59 (L) 05/20/2021   HGBA1C 7.1 (H) 05/19/2021    Review of Glycemic Control Results for Frank [name], Frank [name] (MRN [id number]) as of 05/20/2021 09:47  Ref. Range 05/19/2021 11:55 05/19/2021 17:16 05/19/2021 22:23 05/20/2021 08:12  Glucose-Capillary Latest Ref Range: 70 - 99 mg/dL 108 (H) 134 (H) 92 59 (L)   Diabetes history: DM2 Outpatient Diabetes medications: Glucotrol XL 10 mg qd, Toujeo 10 units qd Current orders for Inpatient glycemic control: Semglee 8 units, Novolog correction 0-9 units tid  Inpatient Diabetes Program Recommendations:   Fasting CBG 59 -Decrease Semglee to 5 units qd Secure chat sent to Dr. Lupita Leash.  Thank you, Nani Gasser. Darien Kading, RN, MSN, CDE  Diabetes Coordinator Inpatient Glycemic Control Team Team Pager [phone] (8am-5pm) 05/20/2021 9:47 AM

## 2021-05-20 NOTE — Progress Notes (Signed)
Remote pacemaker transmission.   

## 2021-05-20 NOTE — ED Notes (Signed)
Informed nurse of pt CBG

## 2021-05-20 NOTE — Progress Notes (Signed)
Pharmacy Antibiotic Note  Frank [name] is a 80 y.o. male admitted on 05/19/2021 presenting with weakness/fever, tx with ceftriaxone for UTI, now UCx growing pseudomonas.  Pharmacy has been consulted for cefepime dosing.  Plan: D/c ceftriaxone Cefepime 2g IV q 8h Monitor renal function, UCx susceptibilities and clinical progression to narrow vs transition to PO     No data recorded.  Recent Labs  Lab 05/19/21 0431 05/19/21 0744 05/20/21 0344  WBC 17.7*  --  12.4*  CREATININE 1.56*  --  1.17  LATICACIDVEN 2.1* 1.8  --     CrCl cannot be calculated (Unknown ideal weight.).    Allergies  Allergen Reactions   Lisinopril Cough   Invokana [Canagliflozin] Other (See Comments)    Stopped by MD, unknown reaction    Penicillins Other (See Comments)    Allergic as a child.  Patient does not remember reaction.  Has had cephalasporins without problems.   Sulfamethoxazole Other (See Comments)    Unknown reaction- from childhood    Bertis Ruddy, PharmD Clinical Pharmacist ED Pharmacist Phone # [phone] 05/20/2021 8:25 AM

## 2021-05-20 NOTE — ED Notes (Signed)
This RN spoke with Alvis Lemmings RN to confirm with them that pt is being admitted so that they do not send staff to his residence.

## 2021-05-20 NOTE — Progress Notes (Signed)
[facility] night cross cover note:  I was notified by RN of patient's request for medication for constipation.  Subsequently, I placed order for scheduled Colace twice daily as well as MiraLAX daily prn.      Babs Bertin, DO Hospitalist

## 2021-05-20 NOTE — Progress Notes (Signed)
PROGRESS NOTE    Frank [name]  GYJ:[id number] DOB: 03-21-1941 DOA: 05/19/2021 PCP: Janith Lima, MD   Chief Complaint  Patient presents with   Gen. Weakness / Poss.UTI    Brief Narrative/Hospital Course:  Frank [name], 80 y.o. 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 As per report he had gotten up to use the restroom but unable to sit up on his own due to being too weak at baseline able to stand and pivot but had been getting around mostly in wheelchair due to multiple falls since recent carotid artery stent placement.  He had a fever up to 101, EMS was called and brought to the ED. Mildly confused in the ED poor historian, UA showed UTI with indwelling catheter followed by Dr. Durene Fruits from urology with plan for suprapubic catheter placement and cystoscopy for urine retention in near future.  He has had recent weight loss of 21 pounds since August, has not been using CPAP at night. Patient was admitted on IV antibiotics    Subjective: seen examined this morning in the ED. Feels better Making a bm now- co constipation On RA NO focal weakness, has bene off balance and weak at home Has indewelling foley  Assessment & Plan:  Sepsis secondary to UTI secondary to Pseudomonas due to chronic indwelling catheter: Antibiotic changed to cefepime.  Monitor WBC count follow-up .follow further culture sensitivity.  Seems to be responding well.  He is supposed to follow-up with Dr. Amalia Hailey from urology for suprapubic catheter and cystoscopy in near future soon. Recent Labs  Lab 05/19/21 0431 05/19/21 0744 05/20/21 0344  WBC 17.7*  --  12.4*  LATICACIDVEN 2.1* 1.8  --      HTN/HLD CVA/Right carotid artery stenosis status post stenting 9/22: Blood pressure is fairly controlled.  Transient hypotension  in the ED.  Holding amlodipine.  Had recent stent placed for his carotid artery stenosis by IR continue aspirin Brilinta and statin.  Acute metabolic encephalopathy in the setting of history of TBI : Mild confusion on admission due to UTI mental status appears to be improving.  Continue supportive care and antibiotics.  Continue his Keppra for TBI.  AKI on CKD stage IIIa- creatinine 1.5 on admission improved to 1.9.  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  Monitor closely, he received B12 shots in outpatient setting.   Recent Labs  Lab 05/19/21 0431 05/20/21 0344  HGB 12.4* 10.4*  HCT 38.0* 32.9*    OSA: Has not been using CPAP recently.  BPH: Start Flomax  GOC:DNR   Generalized weakness/debility in the setting of sepsis continue PT OT  Weight pending to evaluate BMI.  Wife endorses significant weight loss.  Nutrition consulted  DVT prophylaxis: enoxaparin (LOVENOX) injection 40 mg Start: 05/19/21 1000 Code Status:   Code Status: DNR Family Communication: plan of care discussed with patient at bedside. Status is: Inpatient  Remains inpatient appropriate because: For ongoing management of Pseudomonas UTI   Objective: Vitals last 24 hrs: Vitals:   05/20/21 0715 05/20/21 0730 05/20/21 0745 05/20/21 0800  BP: 138/79 129/77 (!) 154/72 (!) 139/98  Pulse: 68 73 69 70  Resp:    16  Temp:      TempSrc:      SpO2: 98% 92% 98% 96%   Weight change:   Intake/Output Summary (Last 24 hours) at 05/20/2021 0852 Last data filed at 05/20/2021 0602 Gross per 24 hour   Intake --  Output 1800 ml  Net -1800 ml   Net IO Since Admission: -1,800 mL [05/20/21 0852]   Physical Examination: General exam: AA oriented at baseline, weak,older than stated age. HEENT:Oral mucosa moist, Ear/Nose WNL grossly,dentition normal. Respiratory system: B/l clear BS, no use of accessory muscle, non tender. Cardiovascular system: S1 & S2 +,No JVD. Gastrointestinal system: Abdomen soft, NT,ND, BS+. Nervous System:Alert, awake, moving extremities. Extremities: edema none, distal peripheral pulses palpable.  Skin: No rashes, no icterus. MSK: Normal muscle bulk, tone, power.  Medications reviewed:  Scheduled Meds:  aspirin  81 mg Oral Daily   atorvastatin  5 mg Oral Daily   darifenacin  15 mg Oral Daily   docusate sodium  100 mg Oral BID   enoxaparin (LOVENOX) injection  40 mg Subcutaneous Q24H   ferrous gluconate  324 mg Oral BID   FLUoxetine  20 mg Oral Daily   insulin aspart  0-9 Units Subcutaneous TID WC   insulin glargine-yfgn  8 Units Subcutaneous QHS   lactose free nutrition  237 mL Oral BID BM   levETIRAcetam  500 mg Oral BID   sodium chloride flush  3 mL Intravenous Q12H   ticagrelor  45 mg Oral BID   Continuous Infusions:  sodium chloride 100 mL/hr at 05/20/21 0447   ceFEPime (MAXIPIME) IV     Diet Order             Diet Carb Modified Fluid consistency: Thin; Room service appropriate? Yes  Diet effective now                          Weight change:   Wt Readings from Last 3 Encounters:  04/19/21 84.8 kg  03/31/21 84.8 kg  02/10/21 83.5 kg     Consultants:see note  Procedures:see note Antimicrobials: Anti-infectives (From admission, onward)    Start     Dose/Rate Route Frequency Ordered Stop   05/20/21 1000  cefTRIAXone (ROCEPHIN) 2 g in sodium chloride 0.9 % 100 mL IVPB  Status:  Discontinued        2 g 200 mL/hr over 30 Minutes Intravenous Every 24 hours 05/19/21 1039 05/20/21 0823   05/20/21 0830  ceFEPIme (MAXIPIME) 2 g in  sodium chloride 0.9 % 100 mL IVPB        2 g 200 mL/hr over 30 Minutes Intravenous Every 8 hours 05/20/21 0823     05/19/21 1045  cefTRIAXone (ROCEPHIN) 1 g in sodium chloride 0.9 % 100 mL IVPB        1 g 200 mL/hr over 30 Minutes Intravenous  Once 05/19/21 1038 05/19/21 1203   05/19/21 0730  cefTRIAXone (ROCEPHIN) 1 g in sodium chloride 0.9 % 100 mL IVPB        1 g 200 mL/hr over 30 Minutes Intravenous  Once 05/19/21 0724 05/19/21 0973      Culture/Microbiology    Component Value Date/Time   SDES  BLOOD RIGHT ARM 05/19/2021 0431   SDES BLOOD RIGHT HAND 05/19/2021 0431   SPECREQUEST  05/19/2021 0431    AEROBIC BOTTLE ONLY Blood Culture results may not be optimal due to an inadequate volume of blood received in culture bottles   SPECREQUEST  05/19/2021 0431    BOTTLES DRAWN AEROBIC AND ANAEROBIC Blood Culture results may not be optimal due to an inadequate volume of blood received in culture bottles   CULT  05/19/2021 0431    NO GROWTH 1 DAY Performed at [facility], [place] [address], [place], [place] [address]    CULT  05/19/2021 0431    NO GROWTH 1 DAY Performed at [facility], [place] [address], [place], [place] [address]    REPTSTATUS PENDING 05/19/2021 0431   REPTSTATUS PENDING 05/19/2021 0431    Other culture-see note  Unresulted Labs (From admission, onward)    None     Data Reviewed: I have personally reviewed following labs and imaging studies CBC: Recent Labs  Lab 05/19/21 0431 05/20/21 0344  WBC 17.7* 12.4*  NEUTROABS 14.6*  --   HGB 12.4* 10.4*  HCT 38.0* 32.9*  MCV 82.4 84.1  PLT 189 263   Basic Metabolic Panel: Recent Labs  Lab 05/19/21 0431 05/20/21 0344  NA 136 135  K 4.1 3.5  CL 102 101  CO2 23 25  GLUCOSE 183* 60*  BUN 26* 19  CREATININE 1.56* 1.17  CALCIUM 8.9 8.3*   GFR: CrCl cannot be calculated (Unknown ideal weight.). Liver Function Tests: Recent Labs  Lab 05/19/21 0431  AST 18  ALT 18  ALKPHOS 70  BILITOT 0.8   PROT 6.5  ALBUMIN 3.2*   No results for input(s): LIPASE, AMYLASE in the last 168 hours. No results for input(s): AMMONIA in the last 168 hours. Coagulation Profile: No results for input(s): INR, PROTIME in the last 168 hours. Cardiac Enzymes: No results for input(s): CKTOTAL, CKMB, CKMBINDEX, TROPONINI in the last 168 hours. BNP (last 3 results) No results for input(s): PROBNP in the last 8760 hours. HbA1C: Recent Labs    05/19/21 0431  HGBA1C 7.1*   CBG: Recent Labs  Lab 05/19/21 1155 05/19/21 1716 05/19/21 2223 05/20/21 0812  GLUCAP 108* 134* 92 59*   Lipid Profile: No results for input(s): CHOL, HDL, LDLCALC, TRIG, CHOLHDL, LDLDIRECT in the last 72 hours. Thyroid Function Tests: No results for input(s): TSH, T4TOTAL, FREET4, T3FREE, THYROIDAB in the last 72 hours. Anemia Panel: No results for input(s): VITAMINB12, FOLATE, FERRITIN, TIBC, IRON, RETICCTPCT in the last 72 hours. Sepsis Labs: Recent Labs  Lab 05/19/21 0431 05/19/21 0744  LATICACIDVEN 2.1* 1.8    Recent Results (from the past 240 hour(s))  Urine Culture     Status: Abnormal (Preliminary result)   Collection Time: 05/19/21  4:26 AM   Specimen: Urine, Catheterized  Result Value Ref Range Status   Specimen Description URINE, CATHETERIZED  Final   Special Requests NONE  Final   Culture (A)  Final    >=100,000 COLONIES/mL PSEUDOMONAS AERUGINOSA SUSCEPTIBILITIES TO FOLLOW Performed at [facility], [address], [place],  [address]    Report Status PENDING  Incomplete  Culture, blood (routine x 2)     Status: None (Preliminary result)   Collection Time: 05/19/21  4:31 AM   Specimen: BLOOD RIGHT ARM  Result Value Ref Range Status   Specimen Description BLOOD RIGHT ARM  Final   Special Requests   Final    AEROBIC  BOTTLE ONLY Blood Culture results may not be optimal due to an inadequate volume of blood received in culture bottles   Culture   Final    NO GROWTH 1 DAY Performed at  [place] [address] [place], [place] [address]    Report Status PENDING  Incomplete  Culture, blood (routine x 2)     Status: None (Preliminary result)   Collection Time: 05/19/21  4:31 AM   Specimen: BLOOD RIGHT HAND  Result Value Ref Range Status   Specimen Description BLOOD RIGHT HAND  Final   Special Requests   Final    BOTTLES DRAWN AEROBIC AND ANAEROBIC Blood Culture results may not be optimal due to an inadequate volume of blood received in culture bottles   Culture   Final    NO GROWTH 1 DAY Performed at [facility], [place] [address] [place], [place] [address]    Report Status PENDING  Incomplete  Resp Panel by RT-PCR (Flu A&B, Covid) Nasopharyngeal Swab     Status: None   Collection Time: 05/19/21  8:33 AM   Specimen: Nasopharyngeal Swab; Nasopharyngeal(NP) swabs in vial transport medium  Result Value Ref Range Status   SARS Coronavirus 2 by RT PCR NEGATIVE NEGATIVE Final    Comment: (NOTE) SARS-CoV-2 target nucleic acids are NOT DETECTED.  The SARS-CoV-2 RNA is generally detectable in upper respiratory specimens during the acute phase of infection. The lowest concentration of SARS-CoV-2 viral copies this assay can detect is 138 copies/mL. A negative result does not preclude SARS-Cov-2 infection and should not be used as the sole basis for treatment or other patient management decisions. A negative result may occur with  improper specimen collection/handling, submission of specimen other than nasopharyngeal swab, presence of viral mutation(s) within the areas targeted by this assay, and inadequate number of viral copies(<138 copies/mL). A negative result must be combined with clinical observations, patient history, and epidemiological information. The expected result is Negative.  Fact Sheet for Patients:  EntrepreneurPulse.com.au  Fact Sheet for Healthcare Providers:  IncredibleEmployment.be  This test is no  t yet approved or cleared by the [place] FDA and  has been authorized for detection and/or diagnosis of SARS-CoV-2 by FDA under an Emergency Use Authorization (EUA). This EUA will remain  in effect (meaning this test can be used) for the duration of the COVID-19 declaration under Section 564(b)(1) of the Act, 21 U.S.C.section 360bbb-3(b)(1), unless the authorization is terminated  or revoked sooner.       Influenza A by PCR NEGATIVE NEGATIVE Final   Influenza B by PCR NEGATIVE NEGATIVE Final    Comment: (NOTE) The Xpert Xpress SARS-CoV-2/FLU/RSV plus assay is intended as an aid in the diagnosis of influenza from Nasopharyngeal swab specimens and should not be used as a sole basis for treatment. Nasal washings and aspirates are unacceptable for Xpert Xpress SARS-CoV-2/FLU/RSV testing.  Fact Sheet for Patients: EntrepreneurPulse.com.au  Fact Sheet for Healthcare Providers: IncredibleEmployment.be  This test is not yet approved or cleared by the [place] FDA and has been authorized for detection and/or diagnosis of SARS-CoV-2 by FDA under an Emergency Use Authorization (EUA). This EUA will remain in effect (meaning this test can be used) for the duration of the COVID-19 declaration under Section 564(b)(1) of the Act, 21 U.S.C. section 360bbb-3(b)(1), unless the authorization is terminated or revoked.  Performed at [facility], [place] [address] [place], [place] [address]      Radiology Studies: No results found.  LOS: 1 day   Antonieta Pert, MD [facility]  05/20/2021, 8:52 AM

## 2021-05-20 NOTE — Evaluation (Signed)
Physical Therapy Evaluation Patient Details Name: Frank [name] MRN: [id number] DOB: 02-Nov-1940 Today's Date: 05/20/2021  History of Present Illness  Pt is an 80 y/o male admitted secondary to increased weakness. Found to have sepsis secondary to UTI. PMH includes DM, TBI, CVA, HTN and s/p pacemaker.  Clinical Impression  Pt admitted secondary to problem above with deficits below. Pt requiring mod A for bed mobility and to stand X2. Pt taking a few steps at EOB with mod A, however, had difficulty moving LLE. Pt reports normally he uses WC for mobility as it has been difficult to walk. Also reports he has 24/7 caregivers at home; will need to ensure accuracy. If pt close to baseline and has necessary support, recommending HHPT. If he does not have necessary support, recommend SNF level therapies. Will continue to follow acutely.       Recommendations for follow up therapy are one component of a multi-disciplinary discharge planning process, led by the attending physician.  Recommendations may be updated based on patient status, additional functional criteria and insurance authorization.  Follow Up Recommendations Home health PT (as long as family/caregiver can provide necessary support; if not will likely need SNF)    Assistance Recommended at Discharge Frequent or constant Supervision/Assistance  Functional Status Assessment Patient has had a recent decline in their functional status and demonstrates the ability to make significant improvements in function in a reasonable and predictable amount of time.  Equipment Recommendations  None recommended by PT    Recommendations for Other Services       Precautions / Restrictions Precautions Precautions: Fall Restrictions Weight Bearing Restrictions: No      Mobility  Bed Mobility Overal bed mobility: Needs Assistance Bed Mobility: Supine to Sit;Sit to Supine     Supine to sit: Mod assist Sit to supine: Mod assist   General  bed mobility comments: Required assist for LE and trunk assist. Increased time required.    Transfers Overall transfer level: Needs assistance Equipment used: Rolling walker (2 wheels) Transfers: Sit to/from Stand Sit to Stand: Mod assist           General transfer comment: Mod A for lift assist and steadying to stand from elevated surface. Stood X 2 as pt unable to maintain standing for prolonged period initially. Attempted to take steps at EOB on second stand, but pt having difficulty stepping with LLE.    Ambulation/Gait                  Stairs            Wheelchair Mobility    Modified Rankin (Stroke Patients Only)       Balance Overall balance assessment: Needs assistance Sitting-balance support: No upper extremity supported;Feet supported Sitting balance-Leahy Scale: Fair     Standing balance support: Bilateral upper extremity supported Standing balance-Leahy Scale: Poor Standing balance comment: Reliant on UE and external support                             Pertinent Vitals/Pain Pain Assessment: No/denies pain    Home Living Family/patient expects to be discharged to:: Private residence Living Arrangements: Spouse/significant other Available Help at Discharge: Available 24 hours/day;Family;Personal care attendant Type of Home: House Home Access: Ramped entrance;Stairs to enter Entrance Stairs-Rails: Can reach both Entrance Stairs-Number of Steps: 4   Home Layout: One level Home Equipment: Wheelchair - Publishing copy (2 wheels);Rollator (4 wheels) Additional Comments: Reports he has  24 hour caregivers.    Prior Function Prior Level of Function : Needs assist       Physical Assist : Mobility (physical);ADLs (physical) Mobility (physical): Transfers;Gait ADLs (physical): Dressing;Bathing Mobility Comments: Using WC at home. Pt reports he can normally transfer independently, but unsure of accuracy ADLs Comments:  Caregiver helps with ADLs.     Hand Dominance        Extremity/Trunk Assessment   Upper Extremity Assessment Upper Extremity Assessment: Defer to OT evaluation    Lower Extremity Assessment Lower Extremity Assessment: LLE deficits/detail LLE Deficits / Details: Reports LLE weakness at baseline    Cervical / Trunk Assessment Cervical / Trunk Assessment: Kyphotic  Communication   Communication: No difficulties  Cognition Arousal/Alertness: Awake/alert Behavior During Therapy: WFL for tasks assessed/performed Overall Cognitive Status: History of cognitive impairments - at baseline                                 General Comments: TBI at baseline        General Comments General comments (skin integrity, edema, etc.): No family present    Exercises     Assessment/Plan    PT Assessment Patient needs continued PT services  PT Problem List Decreased strength;Decreased activity tolerance;Decreased balance;Decreased mobility;Decreased coordination;Decreased cognition;Decreased knowledge of use of DME;Decreased safety awareness       PT Treatment Interventions DME instruction;Gait training;Therapeutic activities;Functional mobility training;Therapeutic exercise;Balance training;Cognitive remediation;Patient/family education    PT Goals (Current goals can be found in the Care Plan section)  Acute Rehab PT Goals Patient Stated Goal: to go home PT Goal Formulation: With patient Time For Goal Achievement: 06/03/21 Potential to Achieve Goals: Good    Frequency Min 3X/week   Barriers to discharge        Co-evaluation               AM-PAC PT "6 Clicks" Mobility  Outcome Measure Help needed turning from your back to your side while in a flat bed without using bedrails?: A Lot Help needed moving from lying on your back to sitting on the side of a flat bed without using bedrails?: A Lot Help needed moving to and from a bed to a chair (including a  wheelchair)?: A Lot Help needed standing up from a chair using your arms (e.g., wheelchair or bedside chair)?: A Lot Help needed to walk in hospital room?: A Lot Help needed climbing 3-5 steps with a railing? : Total 6 Click Score: 11    End of Session Equipment Utilized During Treatment: Gait belt Activity Tolerance: Patient tolerated treatment well Patient left: in bed;with call bell/phone within reach;with bed alarm set Nurse Communication: Mobility status PT Visit Diagnosis: Other abnormalities of gait and mobility (R26.89);Unsteadiness on feet (R26.81);Muscle weakness (generalized) (M62.81);Difficulty in walking, not elsewhere classified (R26.2)    Time: 3546-5681 PT Time Calculation (min) (ACUTE ONLY): 16 min   Charges:   PT Evaluation $PT Eval Moderate Complexity: 1 Mod          Reuel Derby, PT, DPT  Acute Rehabilitation Services  Pager: [phone] Office: [phone]   Rudean Hitt 05/20/2021, 6:38 PM

## 2021-05-21 DIAGNOSIS — N390 Urinary tract infection, site not specified: Secondary | ICD-10-CM | POA: Diagnosis not present

## 2021-05-21 DIAGNOSIS — A419 Sepsis, unspecified organism: Secondary | ICD-10-CM | POA: Diagnosis not present

## 2021-05-21 LAB — CBC
HCT: 32.4 % — ABNORMAL LOW (ref 39.0–52.0)
Hemoglobin: 10.3 g/dL — ABNORMAL LOW (ref 13.0–17.0)
MCH: 26.6 pg (ref 26.0–34.0)
MCHC: 31.8 g/dL (ref 30.0–36.0)
MCV: 83.7 fL (ref 80.0–100.0)
Platelets: 150 10*3/uL (ref 150–400)
RBC: 3.87 MIL/uL — ABNORMAL LOW (ref 4.22–5.81)
RDW: 13.3 % (ref 11.5–15.5)
WBC: 8.1 10*3/uL (ref 4.0–10.5)
nRBC: 0 % (ref 0.0–0.2)

## 2021-05-21 LAB — GLUCOSE, CAPILLARY
Glucose-Capillary: 141 mg/dL — ABNORMAL HIGH (ref 70–99)
Glucose-Capillary: 182 mg/dL — ABNORMAL HIGH (ref 70–99)

## 2021-05-21 MED ORDER — CIPROFLOXACIN HCL 500 MG PO TABS: 500.0000 mg | ORAL_TABLET | Freq: Two times a day (BID) | ORAL | 0 refills | Status: AC

## 2021-05-21 NOTE — Discharge Summary (Signed)
Physician Discharge Summary  Frank [name] ZSW:[id number] DOB: 1940-09-02 DOA: 05/19/2021  PCP: Janith Lima, MD  Admit date: 05/19/2021 Discharge date: 05/21/2021  Admitted From: home Disposition:  [place]  Recommendations for Outpatient Follow-up:  Follow up with PCP in 1-2 weeks Will get urology at [facility] in 4 to 7 days. Please obtain BMP/CBC in one week Please follow up on the following pending results:  Home Health:YES  Equipment/Devices: NO  Discharge Condition: Stable Code Status:   Code Status: DNR Diet recommendation:  Diet Order             Diet - low sodium heart healthy           Diet Carb Modified Fluid consistency: Thin; Room service appropriate? Yes  Diet effective now                    Brief/Interim Summary: Frank [name], 80 y.o. 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 As per report he had gotten up to use the restroom but unable to sit up on his own due to being too weak at baseline able to stand and pivot but had been getting around mostly in wheelchair due to multiple falls since recent carotid artery stent placement.  He had a fever up to 101, EMS was called and brought to the ED. Mildly confused in the ED poor historian, UA showed UTI with indwelling catheter followed by Dr. Durene Fruits from urology with plan for suprapubic catheter placement and cystoscopy for urine retention in near future.  He has had recent weight loss of 21 pounds since August, has not been using CPAP at night. Patient was admitted on IV antibiotics. Urine culture grew Pseudomonas-sensitive to ciprofloxacin.  At this time his leukocytosis has resolved.  AKI resolved.  He is back to baseline.  He will be discharged on oral ciprofloxacin and he will keep up his appointment with Dr. Amalia Hailey from  urology later this week or early next week. He feels well and he would like to be discharged home today.  Seen by PT OT and will set up home health PT  Discharge Diagnoses:   Sepsis secondary to Pseudomonas UTI due to chronic indwelling catheter: Managed with cefepime.  Pseudomonas is pansensitive -switch to oral Cipro on discharge.  Clinically improved.  Leukocytosis resolved -He is supposed to follow-up with Dr. Amalia Hailey from urology for suprapubic catheter and cystoscopy in near future soon at [facility] and advised to [facility] soon Recent Labs  Lab 05/19/21 0431 05/19/21 0744 05/20/21 0344 05/21/21 0809  WBC 17.7*  --  12.4* 8.1  LATICACIDVEN 2.1* 1.8  --   --     HTN/HLD CVA/Right carotid artery stenosis status post stenting 9/22: BP controlled.He had transient hypotension in the ED so holding amlodipine.  Had recent stent placed for his carotid artery stenosis by IR, continue aspirin Brilinta and statin.  Follow-up with PCP to resume amlodipine.  Holding for now.  Acute metabolic encephalopathy in the setting of history of TBI : Mild confusion on admission due to UTI mental status much better.  Continue supportive care.  Continue his Keppra for TBI.  AKI on CKD stage IIIa- creatinine  improved to 1.1 Recent Labs  Lab 05/19/21 0431 05/20/21 0344  BUN 26* 19  CREATININE 1.56* 1.17    Mobitz  type II block status post pacemaker  T2DM: On long-term insulin with hypoglycemia - I have cut down the insulin dose no more estimated blood sugar is stable. Recent Labs  Lab 05/20/21 0812 05/20/21 1048 05/20/21 1611 05/20/21 2117 05/21/21 0641  GLUCAP 59* 156* 222* 164* 141*    Normocytic anemia appears chronic.  Monitor closely, he received B12 shots in outpatient setting.   Recent Labs  Lab 05/19/21 0431 05/20/21 0344 05/21/21 0809  HGB 12.4* 10.4* 10.3*  HCT 38.0* 32.9* 32.4*    OSA: Has not been using CPAP recently.  BPH: Start Flomax  GOC:DNR   Generalized  weakness/debility in the setting of sepsis continue PT OT eval completed setting of home health PT.  Consults: toc  Subjective: Alert awake oriented.  Ambulating in the room.  Wants to go home today.  Feels well. Discharge Exam: Vitals:   05/21/21 0518 05/21/21 0853  BP: 117/66 121/60  Pulse: 79 90  Resp: 12 20  Temp:  98.4 F (36.9 C)  SpO2: 98% 100%   General: Pt is alert, awake, not in acute distress Cardiovascular: RRR, S1/S2 +, no rubs, no gallops Respiratory: CTA bilaterally, no wheezing, no rhonchi Abdominal: Soft, NT, ND, bowel sounds + Extremities: no edema, no cyanosis  Discharge Instructions  Discharge Instructions     Diet - low sodium heart healthy   Complete by: As directed    Increase activity slowly   Complete by: As directed       Allergies as of 05/21/2021       Reactions   Lisinopril Cough   Invokana [canagliflozin] Other (See Comments)   Stopped by MD, unknown reaction    Penicillins Other (See Comments)   Allergic as a child.  Patient does not remember reaction.  Has had cephalasporins without problems.   Sulfamethoxazole Other (See Comments)   Unknown reaction- from childhood        Medication List     STOP taking these medications    amLODipine 10 MG tablet Commonly known as: NORVASC       TAKE these medications    ACCRUFeR 30 MG Caps Generic drug: Ferric Maltol Take 1 capsule by mouth in the morning and at bedtime. What changed: how much to take   aspirin 81 MG chewable tablet Chew 1 tablet (81 mg total) by mouth daily.   atorvastatin 10 MG tablet Commonly known as: LIPITOR Take 1 tablet (10 mg total) by mouth daily. What changed: how much to take   ciprofloxacin 500 MG tablet Commonly known as: Cipro Take 1 tablet (500 mg total) by mouth 2 (two) times daily for 5 days.   FLUoxetine 20 MG capsule Commonly known as: PROZAC TAKE 1 CAPSULE BY MOUTH  DAILY   glipiZIDE 10 MG 24 hr tablet Commonly known as: GLUCOTROL  XL TAKE 1 TABLET BY MOUTH  DAILY   Insulin Pen Needle 32G X 6 MM Misc 1 Act by Does not apply route daily.   lactose free nutrition Liqd Take 237 mLs by mouth 2 (two) times daily between meals.   levETIRAcetam 500 MG tablet Commonly known as: KEPPRA Take 1 tablet (500 mg total) by mouth 2 (two) times daily.   mineral oil-hydrophilic petrolatum ointment Apply 1 application topically as needed for dry skin.   PRESCRIPTION MEDICATION CPAP- At bedtime   solifenacin 10 MG tablet Commonly known as: VESICARE TAKE 1 TABLET BY MOUTH  DAILY   tamsulosin 0.4 MG Caps capsule Commonly known as: Flomax Take 1  capsule (0.4 mg total) by mouth daily after supper.   ticagrelor 90 MG Tabs tablet Commonly known as: BRILINTA Take 0.5 tablets (45 mg total) by mouth 2 (two) times daily.   Toujeo Max SoloStar 300 UNIT/ML Solostar Pen Generic drug: insulin glargine (2 Unit Dial) Inject 10 Units into the skin daily.        Follow-up Information     Janith Lima, MD Follow up in 1 week(s).   Specialty: Internal Medicine Contact information: [place] [address] [phone]                Allergies  Allergen Reactions   Lisinopril Cough   Invokana [Canagliflozin] Other (See Comments)    Stopped by MD, unknown reaction    Penicillins Other (See Comments)    Allergic as a child.  Patient does not remember reaction.  Has had cephalasporins without problems.   Sulfamethoxazole Other (See Comments)    Unknown reaction- from childhood    The results of significant diagnostics from this hospitalization (including imaging, microbiology, ancillary and laboratory) are listed below for reference.    Microbiology: Recent Results (from the past 240 hour(s))  Urine Culture     Status: Abnormal   Collection Time: 05/19/21  4:26 AM   Specimen: Urine, Catheterized  Result Value Ref Range Status   Specimen Description URINE, CATHETERIZED  Final   Special Requests    Final    NONE Performed at [facility], [address], [place], [place] [address]    Culture >=100,000 COLONIES/mL PSEUDOMONAS AERUGINOSA (A)  Final   Report Status 05/20/2021 FINAL  Final   Organism ID, Bacteria PSEUDOMONAS AERUGINOSA (A)  Final      Susceptibility   Pseudomonas aeruginosa - MIC*    CEFTAZIDIME 4 SENSITIVE Sensitive     CIPROFLOXACIN 0.5 SENSITIVE Sensitive     GENTAMICIN <=1 SENSITIVE Sensitive     IMIPENEM 2 SENSITIVE Sensitive     PIP/TAZO 8 SENSITIVE Sensitive     CEFEPIME 2 SENSITIVE Sensitive     * >=100,000 COLONIES/mL PSEUDOMONAS AERUGINOSA  Culture, blood (routine x 2)     Status: None (Preliminary result)   Collection Time: 05/19/21  4:31 AM   Specimen: BLOOD RIGHT ARM  Result Value Ref Range Status   Specimen Description BLOOD RIGHT ARM  Final   Special Requests   Final    AEROBIC BOTTLE ONLY Blood Culture results may not be optimal due to an inadequate volume of blood received in culture bottles   Culture   Final    NO GROWTH 2 DAYS Performed at [facility], [place] [address], [place], [place] [address]    Report Status PENDING  Incomplete  Culture, blood (routine x 2)     Status: None (Preliminary result)   Collection Time: 05/19/21  4:31 AM   Specimen: BLOOD RIGHT HAND  Result Value Ref Range Status   Specimen Description BLOOD RIGHT HAND  Final   Special Requests   Final    BOTTLES DRAWN AEROBIC AND ANAEROBIC Blood Culture results may not be optimal due to an inadequate volume of blood received in culture bottles   Culture   Final    NO GROWTH 2 DAYS Performed at [facility], [place] [address] [place], [place] [address]    Report Status PENDING  Incomplete  Resp Panel by RT-PCR (Flu A&B, Covid) Nasopharyngeal Swab     Status: None   Collection Time: 05/19/21  8:33 AM   Specimen: Nasopharyngeal Swab; Nasopharyngeal(NP) swabs in vial transport medium  Result Value Ref Range Status   SARS Coronavirus 2 by RT PCR NEGATIVE  NEGATIVE Final    Comment: (NOTE) SARS-CoV-2 target nucleic acids are NOT DETECTED.  The SARS-CoV-2 RNA is generally detectable in upper respiratory specimens during the acute phase of infection. The lowest concentration of SARS-CoV-2 viral copies this assay can detect is 138 copies/mL. A negative result does not preclude SARS-Cov-2 infection and should not be used as the sole basis for treatment or other patient management decisions. A negative result may occur with  improper specimen collection/handling, submission of specimen other than nasopharyngeal swab, presence of viral mutation(s) within the areas targeted by this assay, and inadequate number of viral copies(<138 copies/mL). A negative result must be combined with clinical observations, patient history, and epidemiological information. The expected result is Negative.  Fact Sheet for Patients:  EntrepreneurPulse.com.au  Fact Sheet for Healthcare Providers:  IncredibleEmployment.be  This test is no t yet approved or cleared by the [place] FDA and  has been authorized for detection and/or diagnosis of SARS-CoV-2 by FDA under an Emergency Use Authorization (EUA). This EUA will remain  in effect (meaning this test can be used) for the duration of the COVID-19 declaration under Section 564(b)(1) of the Act, 21 U.S.C.section 360bbb-3(b)(1), unless the authorization is terminated  or revoked sooner.       Influenza A by PCR NEGATIVE NEGATIVE Final   Influenza B by PCR NEGATIVE NEGATIVE Final    Comment: (NOTE) The Xpert Xpress SARS-CoV-2/FLU/RSV plus assay is intended as an aid in the diagnosis of influenza from Nasopharyngeal swab specimens and should not be used as a sole basis for treatment. Nasal washings and aspirates are unacceptable for Xpert Xpress SARS-CoV-2/FLU/RSV testing.  Fact Sheet for Patients: EntrepreneurPulse.com.au  Fact Sheet for Healthcare  Providers: IncredibleEmployment.be  This test is not yet approved or cleared by the [place] FDA and has been authorized for detection and/or diagnosis of SARS-CoV-2 by FDA under an Emergency Use Authorization (EUA). This EUA will remain in effect (meaning this test can be used) for the duration of the COVID-19 declaration under Section 564(b)(1) of the Act, 21 U.S.C. section 360bbb-3(b)(1), unless the authorization is terminated or revoked.  Performed at [facility], [place] [address], [place], [place] [address]     Procedures/Studies: CUP PACEART REMOTE DEVICE CHECK  Result Date: 05/14/2021 Scheduled remote reviewed. Normal device function.  Next remote 91 days. LR  IR Radiologist Eval & Mgmt  Result Date: 04/28/2021 EXAM: ESTABLISHED PATIENT OFFICE VISIT CHIEF COMPLAINT: Generalized weakness. Current Pain Level: 1-10 HISTORY OF PRESENT ILLNESS: Patient is an 80 year old right handed gentleman who returns in follow-up accompanied by his spouse following revascularization of the right internal carotid artery with stent assisted angioplasty 03/31/2021. There were no intra procedural or periprocedural complications. He was eventually discharged to [facility] for approximately 2 weeks for rehab physical therapy. The patient was then discharged home where physical therapy has been planned for twice a week as per spouse. In the interim, the patient performs regular exercises as prescribed. Spouse reports patient being generally weak, although the patient has a good appetite and eats regularly 3 meals. The patient ambulates with a walker. Patient continues indwelling Foley catheter for which they are waiting urological consultation as to why the patient feels to urinate despite repeated attempts following Foley removal. Otherwise, overall the patient feels in reasonably good condition and being able  to perform his daily routine activities. Denies recent chills,  fever or rigors. Denies any chest pain, shortness of breath or pedal edema. Denies any coughing, wheezing or hemoptysis. Denies any abdominal pain, constipation, diarrhea or melena. Musculoskeletal as described above. Neurologically denies any headaches, nausea, vomiting, visual aberrations, speech difficulties, sensory or motor abnormalities except for as mentioned above. Past Medical History: Diagnosis * : Date . * : Allergy * : * : Rhinitis . * : Anemia * : * : NOS iron deficient and B12 deficient . * : Arthritis * : . * : AV block, Mobitz 2 * : 05/14/2020 . * : Diabetes mellitus * : * : Type 2 . * : GERD (gastroesophageal reflux disease) * : . * : Hyperlipidemia * : . * : Hypertension * : . 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 * : OSA (obstructive sleep apnea) * : * : cpap . * : Presence of permanent cardiac pacemaker * : . * : TBI (traumatic brain injury) * : Patient Active Problem List * : Diagnosis * : Date Noted . * : Carotid stenosis, symptomatic, with infarction ([place]) * : 03/31/2021 . * : Infection of thumb * : 03/26/2021 . * : Cellulitis of left hand * : 03/25/2021 . * : Atherosclerosis of aorta ([place]) * : 02/10/2021 . * : Generalized weakness * : 12/26/2020 . * : Simple chronic bronchitis ([place]) * : 11/13/2020 . * : Iron deficiency anemia secondary to inadequate dietary iron intake * : 11/06/2020 . * : DNR (do not resuscitate) discussion * : 10/17/2020 . * : Stroke (cerebrum) ([place]) * : 09/09/2020 . * : Middle cerebral artery embolism, right * : 09/09/2020 . * : AV block, Mobitz 2 * : 05/14/2020 . 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 * : Chronic renal disease, stage 3, moderately decreased glomerular filtration rate (GFR) between 30-59 mL/min/1.73 square meter ([place]) * : 11/21/2018 . * : GERD without esophagitis * : 07/22/2017 . * : Depression * : . * : Type 2 diabetes mellitus with peripheral neuropathy ([name]) * : . * : OAB  (overactive bladder) * : 11/26/2014 . * : Routine general medical examination at a health care facility * : 04/06/2014 . * : Vitamin D deficiency * : 07/23/2010 . * : Obstructive sleep apnea * : 09/13/2008 . * : Hyperlipidemia with target LDL less than 100 * : 07/27/2007 . * : BPH (benign prostatic hyperplasia) * : 06/21/2007 . * : OPTIC NEUROPATHY, ISCHEMIC * : 12/23/2006 . * : B12 deficiency anemia * : 04/15/2006 . * : Essential hypertension * : 04/15/2006 . * : Allergic rhinitis * : 04/15/2006 Past Surgical History: Procedure * : Laterality * : Date . * : APPENDECTOMY * : * : . * : arm fracture * : Left * : 2011 * : with hardware . * Trudee Kuster HOLE * : Bilateral * : 08/04/2017 * : Procedure: Haskell Flirt; Surgeon: [name], MD; Location: [facility]; Service: Neurosurgery; Laterality: Bilateral; . * : COLONOSCOPY * : * : . * : CRANIOTOMY * : Left * : 12/01/2017 * : Procedure: Trudee Kuster Holes CRANIOTOMY HEMATOMA EVACUATION SUBDURAL; Surgeon: [name], MD; Location: [facility]; Service: Neurosurgery; Laterality: Left; . * : ESOPHAGOGASTRODUODENOSCOPY * : * : 2006 * : gastritis . * : IR ANGIO INTRA EXTRACRAN SEL COM CAROTID INNOMINATE UNI L MOD SED * : * :  03/31/2021 . * : IR ANGIO VERTEBRAL SEL SUBCLAVIAN INNOMINATE UNI R MOD SED * : * : 03/31/2021 . * : IR CT HEAD LTD * : * : 03/31/2021 . * : IR INTRAVSC STENT CERV CAROTID W/EMB-PROT MOD SED INCL ANGIO * : * : 03/31/2021 . * : IR PERCUTANEOUS ART THROMBECTOMY/INFUSION INTRACRANIAL INC DIAG ANGIO * : * : 09/09/2020 . * : IR RADIOLOGIST EVAL & MGMT * : * : 03/10/2021 . * : PACEMAKER IMPLANT * : N/A * : 05/14/2020 * : Procedure: PACEMAKER IMPLANT; Surgeon: [name], MD; Location: [facility]; Service: Cardiovascular; Laterality: N/A; . * : RADIOLOGY WITH ANESTHESIA * : N/A * : 09/09/2020 * : Procedure: IR WITH ANESTHESIA; Surgeon: Radiologist, Medication, MD; Location: [facility]; Service: Radiology; Laterality: N/A; . * : RADIOLOGY WITH ANESTHESIA * : N/A * : 03/31/2021 * : Procedure:  IR WITH ANESTHESIA STENT PLACEMENT; Surgeon: Luanne Bras, MD; Location: [facility]; Service: Radiology; Laterality: N/A; . * : ROTATOR CUFF REPAIR * : * : . * : SEPTOPLASTY * : * : 1959 * : Deviated Septum . 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 Family History Problem * : Relation * : Age of Onset . * : Heart disease * : Mother * : . * : Breast cancer * : Mother * : . * : Stroke * : Father * : . * : Allergies * : Father * : * :     Father and children . * : Coronary artery disease * : Brother * : . * : Diabetes * : Neg Hx * : . * : Colon cancer * : Neg Hx * : Tobacco Use . * : Smoking status: * : Former * : * : Packs/day: * : 1.00 * : * : Types: * : Cigarettes * : * : Quit date: * : 07/14/1983 * : * : Years since quitting: * : 37.7 . * : Smokeless tobacco: * : Never Vaping Use . * : Vaping Use: * : Never used Substance Use Topics . * : Alcohol use: * : Yes * : * : Alcohol/week: * : 2.0 standard drinks * : * : Types: * : 2 Standard drinks or equivalent per week * : * : Comment: occassionally . * : Drug use: * : No Medications: acetaminophen 500 MG tablet commonly known as: TYLENOL take 500-1,000 mg by mouth every 6 (six) hours as needed for moderate pain or headache. amLODipine 10 MG tablet commonly known as: NORVASC take 1 tablet (10 mg total) by mouth daily. What changed: when to take this aspirin 81 MG chewable tablet chew 1 tablet (81 mg total) by mouth daily. atorvastatin 10 MG tablet commonly known as: LIPITOR take 1 tablet (10 mg total) by mouth daily. docusate sodium 100 MG capsule commonly known as: COLACE take 1 capsule (100 mg total) by mouth 2 (two) times daily. FLUoxetine 20 MG capsule commonly known as: PROZAC TAKE 1 CAPSULE BY MOUTH DAILY fluticasone 50 MCG/ACT nasal spray commonly known as: FLONASE place 1-2 sprays into both nostrils daily as needed for allergies or rhinitis. glipiZIDE 10 MG 24 hr tablet commonly known as: GLUCOTROL XL TAKE 1 TABLET BY MOUTH DAILY Insulin Pen Needle 32G X  6 MM Misc1 Act by Does not apply route daily. levETIRAcetam 500 MG tablet commonly known as: KEPPRA take 1 tablet (500 mg total) by mouth 2 (two) times daily. pantoprazole 40 MG  tablet commonly known as: PROTONIX TAKE 1 TABLET BY MOUTH DAILY PRESCRIPTION MEDICATION CPAP- At bedtime solifenacin 10 MG tablet commonly known as: VESICARE TAKE 1 TABLET BY MOUTH DAILY tamsulosin 0.4 MG Caps capsule commonly known as: Flomax take 1 capsule (0.4 mg total) by mouth daily after supper. ticagrelor 90 MG Tabs tablet commonly known as: BRILINTA take 0.5 tablets (45 mg total) by mouth 2 (two) times daily. Toujeo Max SoloStar 300 UNIT/ML Solostar PenGeneric drug: insulin glargine (2 Unit Dial)Inject 10 Units into the skin daily. Allergies: * Lisinopril: Invokana canagliflozin * Cough: Other (See Comments) * Lisinopril: Stopped by MD, unknown reaction * Lisinopril: Penicillins * Cough: Other (See Comments) * Lisinopril: Allergic as a child. Patient does not remember reaction. Has had cephalasporins without problems. * Lisinopril: Sulfamethoxazole * Cough: Other (See Comments) * Lisinopril: Unknown reaction- from childhood REVIEW OF SYSTEMS: As above. PHYSICAL EXAMINATION: Appears awake, alert, oriented to time, place, space. Affect appropriate. In no acute distress. Responses prompt and appropriate. Patient able to remember what he ate the previous night, and for breakfast correctly. Intermittently exhibits pin rolling movement of his right hand intermittently. No evidence of increased rigidity or cogwheeling in the extremities. Eye range of movements symmetric without subjective or objective diplopia, nystagmus, or restriction with superior eye gaze. Station and gait not tested. ASSESSMENT AND PLAN: Will check platelet inhibition with a P2Y12 check today. Patient advised to continue taking his dual antiplatelets as of right now. Ultrasound of the carotids in 4 to 6 months time. Continue with PT OT twice a week as planned according  to the spouse. Awaiting urology appointment in regards to the need for the urinary catheter. Electronically Signed   By: Luanne Bras M.D.   On: 04/28/2021 08:22    Labs: BNP (last 3 results) No results for input(s): BNP in the last 8760 hours. Basic Metabolic Panel: Recent Labs  Lab 05/19/21 0431 05/20/21 0344  NA 136 135  K 4.1 3.5  CL 102 101  CO2 23 25  GLUCOSE 183* 60*  BUN 26* 19  CREATININE 1.56* 1.17  CALCIUM 8.9 8.3*   Liver Function Tests: Recent Labs  Lab 05/19/21 0431  AST 18  ALT 18  ALKPHOS 70  BILITOT 0.8  PROT 6.5  ALBUMIN 3.2*   No results for input(s): LIPASE, AMYLASE in the last 168 hours. No results for input(s): AMMONIA in the last 168 hours. CBC: Recent Labs  Lab 05/19/21 0431 05/20/21 0344 05/21/21 0809  WBC 17.7* 12.4* 8.1  NEUTROABS 14.6*  --   --   HGB 12.4* 10.4* 10.3*  HCT 38.0* 32.9* 32.4*  MCV 82.4 84.1 83.7  PLT 189 164 150   Cardiac Enzymes: No results for input(s): CKTOTAL, CKMB, CKMBINDEX, TROPONINI in the last 168 hours. BNP: Invalid input(s): POCBNP CBG: Recent Labs  Lab 05/20/21 0812 05/20/21 1048 05/20/21 1611 05/20/21 2117 05/21/21 0641  GLUCAP 59* 156* 222* 164* 141*   D-Dimer No results for input(s): DDIMER in the last 72 hours. Hgb A1c Recent Labs    05/19/21 0431  HGBA1C 7.1*   Lipid Profile No results for input(s): CHOL, HDL, LDLCALC, TRIG, CHOLHDL, LDLDIRECT in the last 72 hours. Thyroid function studies No results for input(s): TSH, T4TOTAL, T3FREE, THYROIDAB in the last 72 hours.  Invalid input(s): FREET3 Anemia work up No results for input(s): VITAMINB12, FOLATE, FERRITIN, TIBC, IRON, RETICCTPCT in the last 72 hours. Urinalysis    Component Value Date/Time   COLORURINE YELLOW 05/19/2021 0435   APPEARANCEUR CLOUDY (  A) 05/19/2021 0435   LABSPEC 1.015 05/19/2021 0435   PHURINE 7.0 05/19/2021 0435   GLUCOSEU NEGATIVE 05/19/2021 0435   GLUCOSEU NEGATIVE 12/26/2020 1600   HGBUR SMALL  (A) 05/19/2021 0435   BILIRUBINUR NEGATIVE 05/19/2021 0435   BILIRUBINUR n 07/19/2013 1136   KETONESUR NEGATIVE 05/19/2021 0435   PROTEINUR 100 (A) 05/19/2021 0435   UROBILINOGEN 0.2 12/26/2020 1600   NITRITE NEGATIVE 05/19/2021 0435   LEUKOCYTESUR LARGE (A) 05/19/2021 0435   Sepsis Labs Invalid input(s): PROCALCITONIN,  WBC,  LACTICIDVEN Microbiology Recent Results (from the past 240 hour(s))  Urine Culture     Status: Abnormal   Collection Time: 05/19/21  4:26 AM   Specimen: Urine, Catheterized  Result Value Ref Range Status   Specimen Description URINE, CATHETERIZED  Final   Special Requests   Final    NONE Performed at [facility], [address], [place], [place] [address]    Culture >=100,000 COLONIES/mL PSEUDOMONAS AERUGINOSA (A)  Final   Report Status 05/20/2021 FINAL  Final   Organism ID, Bacteria PSEUDOMONAS AERUGINOSA (A)  Final      Susceptibility   Pseudomonas aeruginosa - MIC*    CEFTAZIDIME 4 SENSITIVE Sensitive     CIPROFLOXACIN 0.5 SENSITIVE Sensitive     GENTAMICIN <=1 SENSITIVE Sensitive     IMIPENEM 2 SENSITIVE Sensitive     PIP/TAZO 8 SENSITIVE Sensitive     CEFEPIME 2 SENSITIVE Sensitive     * >=100,000 COLONIES/mL PSEUDOMONAS AERUGINOSA  Culture, blood (routine x 2)     Status: None (Preliminary result)   Collection Time: 05/19/21  4:31 AM   Specimen: BLOOD RIGHT ARM  Result Value Ref Range Status   Specimen Description BLOOD RIGHT ARM  Final   Special Requests   Final    AEROBIC BOTTLE ONLY Blood Culture results may not be optimal due to an inadequate volume of blood received in culture bottles   Culture   Final    NO GROWTH 2 DAYS Performed at [facility], [place] [address] [place], [place] [address]    Report Status PENDING  Incomplete  Culture, blood (routine x 2)     Status: None (Preliminary result)   Collection Time: 05/19/21  4:31 AM   Specimen: BLOOD RIGHT HAND  Result Value Ref Range Status   Specimen Description BLOOD  RIGHT HAND  Final   Special Requests   Final    BOTTLES DRAWN AEROBIC AND ANAEROBIC Blood Culture results may not be optimal due to an inadequate volume of blood received in culture bottles   Culture   Final    NO GROWTH 2 DAYS Performed at [facility], [place] [address] [place],  [address]    Report Status PENDING  Incomplete  Resp Panel by RT-PCR (Flu A&B, Covid) Nasopharyngeal Swab     Status: None   Collection Time: 05/19/21  8:33 AM   Specimen: Nasopharyngeal Swab; Nasopharyngeal(NP) swabs in vial transport medium  Result Value Ref Range Status   SARS Coronavirus 2 by RT PCR NEGATIVE NEGATIVE Final    Comment: (NOTE) SARS-CoV-2 target nucleic acids are NOT DETECTED.  The SARS-CoV-2 RNA is generally detectable in upper respiratory specimens during the acute phase of infection. The lowest concentration of SARS-CoV-2 viral copies this assay can detect is 138 copies/mL. A negative result does not preclude SARS-Cov-2 infection and should not be used as the sole basis for treatment or other patient management decisions. A negative result may occur with  improper specimen collection/handling, submission of specimen other than nasopharyngeal swab, presence of viral mutation(s) within the areas targeted by this assay, and inadequate number of viral copies(<138 copies/mL). A negative result must be combined with clinical observations, patient history, and epidemiological information. The expected result is Negative.  Fact Sheet for Patients:  EntrepreneurPulse.com.au  Fact Sheet for Healthcare Providers:  IncredibleEmployment.be  This test is no t yet approved or cleared by the [place] FDA and  has been authorized for detection and/or diagnosis of SARS-CoV-2 by FDA under an Emergency Use Authorization (EUA). This EUA will remain  in effect (meaning this test can be used) for the duration of the COVID-19 declaration under Section  564(b)(1) of the Act, 21 U.S.C.section 360bbb-3(b)(1), unless the authorization is terminated  or revoked sooner.       Influenza A by PCR NEGATIVE NEGATIVE Final   Influenza B by PCR NEGATIVE NEGATIVE Final    Comment: (NOTE) The Xpert Xpress SARS-CoV-2/FLU/RSV plus assay is intended as an aid in the diagnosis of influenza from Nasopharyngeal swab specimens and should not be used as a sole basis for treatment. Nasal washings and aspirates are unacceptable for Xpert Xpress SARS-CoV-2/FLU/RSV testing.  Fact Sheet for Patients: EntrepreneurPulse.com.au  Fact Sheet for Healthcare Providers: IncredibleEmployment.be  This test is not yet approved or cleared by the [place] FDA and has been authorized for detection and/or diagnosis of SARS-CoV-2 by FDA under an Emergency Use Authorization (EUA). This EUA will remain in effect (meaning this test can be used) for the duration of the COVID-19 declaration under Section 564(b)(1) of the Act, 21 U.S.C. section 360bbb-3(b)(1), unless the authorization is terminated or revoked.  Performed at [facility], [place] [address], [place],  [address]      Time coordinating discharge: 25 minutes  SIGNED: Antonieta Pert, MD  [facility] 05/21/2021, 10:48 AM  If 7PM-7AM, please contact night-coverage [email or web address]

## 2021-05-21 NOTE — Progress Notes (Signed)
No legal guardian on file

## 2021-05-21 NOTE — TOC Transition Note (Signed)
Transition of Care [facility]) - CM/SW Discharge Note   Patient Details  Name: Frank [name] MRN: [id number] Date of Birth: 1941/04/24  [facility] CM/SW Contact:  Tom-Johnson, Renea Ee, RN Phone Number: 05/21/2021, 11:11 AM   Clinical Narrative:    CM spoke with patient at bedside about needs for post hospital transition. Granddaughter, Apolonio Schneiders in room at bedside. Patient states he resides with his wife. Has two adult children and three grand children. Presents with weakness, frequent falls and temp of 101. Has a walker,wheelchair, rollator, shower chair with back at home. 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 Has indwelling foley cath followed by Dr.Vance with Urology and will follow up with Dr. Amalia Hailey at discharge. Treated for UTI and will be going home with oral Cipro. Patient is active with [facility] home health and CM called and spoke with Bronwen Betters [phone]) and referral made for PT/OT/RN/Aide services with acceptance voiced. Patient is scheduled for discharge today and granddaughter will transport. No further TOC needs noted.     Final next level of care: Home/Self Care Barriers to Discharge: Barriers Resolved   Patient Goals and CMS Choice Patient states their goals for this hospitalization and ongoing recovery are:: To go home CMS Medicare.gov Compare Post Acute Care list provided to:: Patient Choice offered to / list presented to : NA  Discharge Placement                       Discharge Plan and Services                DME Arranged: N/A DME Agency: NA       HH Arranged: NA HH Agency: NA        Social Determinants of Health (SDOH) Interventions     Readmission Risk Interventions No flowsheet data found.

## 2021-05-21 NOTE — Evaluation (Signed)
Occupational Therapy Evaluation Patient Details Name: Frank [name] MRN: [id number] DOB: Dec 20, 1940 Today's Date: 05/21/2021   History of Present Illness Pt is an 80 y/o male admitted secondary to increased weakness. Found to have sepsis secondary to UTI. PMH includes DM, TBI, CVA, HTN and s/p pacemaker.   Clinical Impression   Pt from home and reports having 24/7 caregivers. Uses a WC for moblity with the exception of his bathroom. He uses grab bars and sink surface and "furniture surfs" He reports getting assist for ADL PRN. Today he is mod A for bed mobility and transfers with RW. He uses a bidet at home and was max A in standing (able to maintain) for rear peri care. Max A for LB dressing/bathing at this time. Pt will benefit from skilled OT post-acute at the [facility] level to maximize safety and independence in ADL and functional transfers.      Recommendations for follow up therapy are one component of a multi-disciplinary discharge planning process, led by the attending physician.  Recommendations may be updated based on patient status, additional functional criteria and insurance authorization.   Follow Up Recommendations  Home health OT    Assistance Recommended at Discharge Frequent or constant Supervision/Assistance  Functional Status Assessment  Patient has had a recent decline in their functional status and demonstrates the ability to make significant improvements in function in a reasonable and predictable amount of time.  Equipment Recommendations  None recommended by OT (Pt has appropriate DME)    Recommendations for Other Services PT consult     Precautions / Restrictions Precautions Precautions: Fall Restrictions Weight Bearing Restrictions: No      Mobility Bed Mobility Overal bed mobility: Needs Assistance Bed Mobility: Supine to Sit     Supine to sit: Mod assist     General bed mobility comments: Required assist for LE and trunk assist. Increased  time required. pad used to bring hips EOB    Transfers Overall transfer level: Needs assistance Equipment used: Rolling walker (2 wheels) Transfers: Sit to/from Stand;Bed to chair/wheelchair/BSC Sit to Stand: Mod assist Stand pivot transfers: Mod assist;From elevated surface         General transfer comment: Mod A for lift assist and steadying to stand from elevated surface. Stood X 2 once from bed with heavy mod and then once from [facility] with mod. vc for safe hand placement      Balance Overall balance assessment: Needs assistance Sitting-balance support: No upper extremity supported;Feet supported Sitting balance-Leahy Scale: Fair     Standing balance support: Bilateral upper extremity supported Standing balance-Leahy Scale: Poor Standing balance comment: Reliant on UE and external support                           ADL either performed or assessed with clinical judgement   ADL                                               Vision Patient Visual Report: No change from baseline       Perception     Praxis      Pertinent Vitals/Pain Pain Assessment: No/denies pain     Hand Dominance Right   Extremity/Trunk Assessment Upper Extremity Assessment Upper Extremity Assessment: Overall WFL for tasks assessed   Lower Extremity Assessment Lower Extremity Assessment: Defer to  PT evaluation LLE Deficits / Details: Reports LLE weakness at baseline   Cervical / Trunk Assessment Cervical / Trunk Assessment: Kyphotic   Communication Communication Communication: No difficulties   Cognition Arousal/Alertness: Awake/alert Behavior During Therapy: WFL for tasks assessed/performed Overall Cognitive Status: History of cognitive impairments - at baseline                                 General Comments: TBI at baseline     General Comments  Grandaughter present    Exercises     Shoulder Instructions      Home Living  Family/patient expects to be discharged to:: Private residence Living Arrangements: Spouse/significant other Available Help at Discharge: Available 24 hours/day;Family;Personal care attendant Type of Home: House Home Access: Ramped entrance;Stairs to enter Entrance Stairs-Number of Steps: 4 Entrance Stairs-Rails: Can reach both Home Layout: One level     Bathroom Shower/Tub: Occupational psychologist: Handicapped height Bathroom Accessibility: Yes How Accessible: Accessible via walker Home Equipment: Wheelchair - Publishing copy (2 wheels);Rollator (4 wheels);Grab bars - toilet;Grab bars - tub/shower;Hand held shower head;Shower seat;Other (comment) (Bidet)   Additional Comments: Reports he has 24 hour caregivers.      Prior Functioning/Environment Prior Level of Function : Needs assist       Physical Assist : Mobility (physical);ADLs (physical) Mobility (physical): Transfers;Gait ADLs (physical): Dressing;Bathing Mobility Comments: Using WC at home. furniture surfs in the bathroom, WC does not dit ADLs Comments: Caregiver helps with ADLs PRN        OT Problem List: Decreased strength;Decreased activity tolerance;Impaired balance (sitting and/or standing)      OT Treatment/Interventions: Self-care/ADL training;Energy conservation;DME and/or AE instruction;Therapeutic activities;Patient/family education;Balance training    OT Goals(Current goals can be found in the care plan section) Acute Rehab OT Goals Patient Stated Goal: get to Grandaughter's wedding on Sat OT Goal Formulation: With patient Time For Goal Achievement: 06/04/21 Potential to Achieve Goals: Good ADL Goals Pt Will Perform Grooming: with modified independence;sitting Pt Will Perform Upper Body Dressing: with modified independence;sitting Pt Will Perform Lower Body Dressing: with min guard assist;with caregiver independent in assisting;sit to/from stand Pt Will Transfer to Toilet: with min  guard assist;ambulating;regular height toilet;grab bars Pt Will Perform Toileting - Clothing Manipulation and hygiene: with modified independence;sitting/lateral leans Pt Will Perform Tub/Shower Transfer: Shower transfer;with min guard assist;with caregiver independent in assisting;ambulating;grab bars  OT Frequency: Min 2X/week   Barriers to D/C:            Co-evaluation              AM-PAC OT "6 Clicks" Daily Activity     Outcome Measure Help from another person eating meals?: None Help from another person taking care of personal grooming?: A Little Help from another person toileting, which includes using toliet, bedpan, or urinal?: A Lot Help from another person bathing (including washing, rinsing, drying)?: A Lot Help from another person to put on and taking off regular upper body clothing?: A Little Help from another person to put on and taking off regular lower body clothing?: A Lot 6 Click Score: 16   End of Session Equipment Utilized During Treatment: Gait belt;Rolling walker (2 wheels) Nurse Communication: Mobility status;Precautions (just gas, no BM)  Activity Tolerance: Patient tolerated treatment well Patient left: in chair;with chair alarm set;with family/visitor present  OT Visit Diagnosis: Unsteadiness on feet (R26.81);Other abnormalities of gait and mobility (R26.89);Muscle weakness (generalized) (M62.81)  Time: 1110-1150 OT Time Calculation (min): 40 min Charges:  OT General Charges $OT Visit: 1 Visit OT Evaluation $OT Eval Moderate Complexity: 1 Mod OT Treatments $Self Care/Home Management : 23-37 mins  Jesse Sans OTR/L Acute Rehabilitation Services Pager: [phone] Office: [phone]   Merri Ray Mirko Tailor 05/21/2021, 12:30 PM

## 2021-05-21 NOTE — Progress Notes (Deleted)
Physician Discharge Summary  Frank [name] TIW:[id number] DOB: Oct 24, 1940 DOA: 05/19/2021  PCP: Janith Lima, MD  Admit date: 05/19/2021 Discharge date: 05/21/2021  Admitted From: home Disposition:  [place]  Recommendations for Outpatient Follow-up:  Follow up with PCP in 1-2 weeks Will get urology at [facility] in 4 to 7 days. Please obtain BMP/CBC in one week Please follow up on the following pending results:  Home Health:YES  Equipment/Devices: NO  Discharge Condition: Stable Code Status:   Code Status: DNR Diet recommendation:  Diet Order             Diet - low sodium heart healthy           Diet Carb Modified Fluid consistency: Thin; Room service appropriate? Yes  Diet effective now                    Brief/Interim Summary: Frank [name], 80 y.o. 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 As per report he had gotten up to use the restroom but unable to sit up on his own due to being too weak at baseline able to stand and pivot but had been getting around mostly in wheelchair due to multiple falls since recent carotid artery stent placement.  He had a fever up to 101, EMS was called and brought to the ED. Mildly confused in the ED poor historian, UA showed UTI with indwelling catheter followed by Dr. Durene Fruits from urology with plan for suprapubic catheter placement and cystoscopy for urine retention in near future.  He has had recent weight loss of 21 pounds since August, has not been using CPAP at night. Patient was admitted on IV antibiotics. Urine culture grew Pseudomonas-sensitive to ciprofloxacin.  At this time his leukocytosis has resolved.  AKI resolved.  He is back to baseline.  He will be discharged on oral ciprofloxacin and he will keep up his appointment with Dr. Amalia Hailey from  urology later this week or early next week. He feels well and he would like to be discharged home today.  Seen by PT OT and will set up home health PT  Discharge Diagnoses:   Sepsis secondary to Pseudomonas UTI due to chronic indwelling catheter: Managed with cefepime.  Pseudomonas is pansensitive -switch to oral Cipro on discharge.  Clinically improved.  Leukocytosis resolved -He is supposed to follow-up with Dr. Amalia Hailey from urology for suprapubic catheter and cystoscopy in near future soon at [facility] and advised to [place] soon Recent Labs  Lab 05/19/21 0431 05/19/21 0744 05/20/21 0344 05/21/21 0809  WBC 17.7*  --  12.4* 8.1  LATICACIDVEN 2.1* 1.8  --   --     HTN/HLD CVA/Right carotid artery stenosis status post stenting 9/22: BP controlled.He had transient hypotension in the ED so holding amlodipine.  Had recent stent placed for his carotid artery stenosis by IR, continue aspirin Brilinta and statin.  Follow-up with PCP to resume amlodipine.  Holding for now.  Acute metabolic encephalopathy in the setting of history of TBI : Mild confusion on admission due to UTI mental status much better.  Continue supportive care.  Continue his Keppra for TBI.  AKI on CKD stage IIIa- creatinine  improved to 1.1 Recent Labs  Lab 05/19/21 0431 05/20/21 0344  BUN 26* 19  CREATININE 1.56* 1.17    Mobitz  type II block status post pacemaker  T2DM: On long-term insulin with hypoglycemia - I have cut down the insulin dose no more estimated blood sugar is stable. Recent Labs  Lab 05/20/21 0812 05/20/21 1048 05/20/21 1611 05/20/21 2117 05/21/21 0641  GLUCAP 59* 156* 222* 164* 141*    Normocytic anemia appears chronic.  Monitor closely, he received B12 shots in outpatient setting.   Recent Labs  Lab 05/19/21 0431 05/20/21 0344 05/21/21 0809  HGB 12.4* 10.4* 10.3*  HCT 38.0* 32.9* 32.4*    OSA: Has not been using CPAP recently.  BPH: Start Flomax  GOC:DNR   Generalized  weakness/debility in the setting of sepsis continue PT OT eval completed setting of home health PT.  Consults: toc  Subjective: Alert awake oriented.  Ambulating in the room.  Wants to go home today.  Feels well. Discharge Exam: Vitals:   05/21/21 0518 05/21/21 0853  BP: 117/66 121/60  Pulse: 79 90  Resp: 12 20  Temp:  98.4 F (36.9 C)  SpO2: 98% 100%   General: Pt is alert, awake, not in acute distress Cardiovascular: RRR, S1/S2 +, no rubs, no gallops Respiratory: CTA bilaterally, no wheezing, no rhonchi Abdominal: Soft, NT, ND, bowel sounds + Extremities: no edema, no cyanosis  Discharge Instructions  Discharge Instructions     Diet - low sodium heart healthy   Complete by: As directed    Increase activity slowly   Complete by: As directed       Allergies as of 05/21/2021       Reactions   Lisinopril Cough   Invokana [canagliflozin] Other (See Comments)   Stopped by MD, unknown reaction    Penicillins Other (See Comments)   Allergic as a child.  Patient does not remember reaction.  Has had cephalasporins without problems.   Sulfamethoxazole Other (See Comments)   Unknown reaction- from childhood        Medication List     STOP taking these medications    amLODipine 10 MG tablet Commonly known as: NORVASC       TAKE these medications    ACCRUFeR 30 MG Caps Generic drug: Ferric Maltol Take 1 capsule by mouth in the morning and at bedtime. What changed: how much to take   aspirin 81 MG chewable tablet Chew 1 tablet (81 mg total) by mouth daily.   atorvastatin 10 MG tablet Commonly known as: LIPITOR Take 1 tablet (10 mg total) by mouth daily. What changed: how much to take   ciprofloxacin 500 MG tablet Commonly known as: Cipro Take 1 tablet (500 mg total) by mouth 2 (two) times daily for 5 days.   FLUoxetine 20 MG capsule Commonly known as: PROZAC TAKE 1 CAPSULE BY MOUTH  DAILY   glipiZIDE 10 MG 24 hr tablet Commonly known as: GLUCOTROL  XL TAKE 1 TABLET BY MOUTH  DAILY   Insulin Pen Needle 32G X 6 MM Misc 1 Act by Does not apply route daily.   lactose free nutrition Liqd Take 237 mLs by mouth 2 (two) times daily between meals.   levETIRAcetam 500 MG tablet Commonly known as: KEPPRA Take 1 tablet (500 mg total) by mouth 2 (two) times daily.   mineral oil-hydrophilic petrolatum ointment Apply 1 application topically as needed for dry skin.   PRESCRIPTION MEDICATION CPAP- At bedtime   solifenacin 10 MG tablet Commonly known as: VESICARE TAKE 1 TABLET BY MOUTH  DAILY   tamsulosin 0.4 MG Caps capsule Commonly known as: Flomax Take 1  capsule (0.4 mg total) by mouth daily after supper.   ticagrelor 90 MG Tabs tablet Commonly known as: BRILINTA Take 0.5 tablets (45 mg total) by mouth 2 (two) times daily.   Toujeo Max SoloStar 300 UNIT/ML Solostar Pen Generic drug: insulin glargine (2 Unit Dial) Inject 10 Units into the skin daily.        Follow-up Information     Janith Lima, MD Follow up in 1 week(s).   Specialty: Internal Medicine Contact information: [place] [address] [phone]                Allergies  Allergen Reactions   Lisinopril Cough   Invokana [Canagliflozin] Other (See Comments)    Stopped by MD, unknown reaction    Penicillins Other (See Comments)    Allergic as a child.  Patient does not remember reaction.  Has had cephalasporins without problems.   Sulfamethoxazole Other (See Comments)    Unknown reaction- from childhood    The results of significant diagnostics from this hospitalization (including imaging, microbiology, ancillary and laboratory) are listed below for reference.    Microbiology: Recent Results (from the past 240 hour(s))  Urine Culture     Status: Abnormal   Collection Time: 05/19/21  4:26 AM   Specimen: Urine, Catheterized  Result Value Ref Range Status   Specimen Description URINE, CATHETERIZED  Final   Special Requests    Final    NONE Performed at [facility], [address], [place], [place] [address]    Culture >=100,000 COLONIES/mL PSEUDOMONAS AERUGINOSA (A)  Final   Report Status 05/20/2021 FINAL  Final   Organism ID, Bacteria PSEUDOMONAS AERUGINOSA (A)  Final      Susceptibility   Pseudomonas aeruginosa - MIC*    CEFTAZIDIME 4 SENSITIVE Sensitive     CIPROFLOXACIN 0.5 SENSITIVE Sensitive     GENTAMICIN <=1 SENSITIVE Sensitive     IMIPENEM 2 SENSITIVE Sensitive     PIP/TAZO 8 SENSITIVE Sensitive     CEFEPIME 2 SENSITIVE Sensitive     * >=100,000 COLONIES/mL PSEUDOMONAS AERUGINOSA  Culture, blood (routine x 2)     Status: None (Preliminary result)   Collection Time: 05/19/21  4:31 AM   Specimen: BLOOD RIGHT ARM  Result Value Ref Range Status   Specimen Description BLOOD RIGHT ARM  Final   Special Requests   Final    AEROBIC BOTTLE ONLY Blood Culture results may not be optimal due to an inadequate volume of blood received in culture bottles   Culture   Final    NO GROWTH 2 DAYS Performed at [facility], [place] [address], [place], [place] [address]    Report Status PENDING  Incomplete  Culture, blood (routine x 2)     Status: None (Preliminary result)   Collection Time: 05/19/21  4:31 AM   Specimen: BLOOD RIGHT HAND  Result Value Ref Range Status   Specimen Description BLOOD RIGHT HAND  Final   Special Requests   Final    BOTTLES DRAWN AEROBIC AND ANAEROBIC Blood Culture results may not be optimal due to an inadequate volume of blood received in culture bottles   Culture   Final    NO GROWTH 2 DAYS Performed at [facility], [place] [address], [place], [place] [address]    Report Status PENDING  Incomplete  Resp Panel by RT-PCR (Flu A&B, Covid) Nasopharyngeal Swab     Status: None   Collection Time: 05/19/21  8:33 AM   Specimen: Nasopharyngeal Swab; Nasopharyngeal(NP) swabs in vial transport medium  Result Value Ref Range Status   SARS Coronavirus 2 by RT PCR NEGATIVE  NEGATIVE Final    Comment: (NOTE) SARS-CoV-2 target nucleic acids are NOT DETECTED.  The SARS-CoV-2 RNA is generally detectable in upper respiratory specimens during the acute phase of infection. The lowest concentration of SARS-CoV-2 viral copies this assay can detect is 138 copies/mL. A negative result does not preclude SARS-Cov-2 infection and should not be used as the sole basis for treatment or other patient management decisions. A negative result may occur with  improper specimen collection/handling, submission of specimen other than nasopharyngeal swab, presence of viral mutation(s) within the areas targeted by this assay, and inadequate number of viral copies(<138 copies/mL). A negative result must be combined with clinical observations, patient history, and epidemiological information. The expected result is Negative.  Fact Sheet for Patients:  EntrepreneurPulse.com.au  Fact Sheet for Healthcare Providers:  IncredibleEmployment.be  This test is no t yet approved or cleared by the [place] FDA and  has been authorized for detection and/or diagnosis of SARS-CoV-2 by FDA under an Emergency Use Authorization (EUA). This EUA will remain  in effect (meaning this test can be used) for the duration of the COVID-19 declaration under Section 564(b)(1) of the Act, 21 U.S.C.section 360bbb-3(b)(1), unless the authorization is terminated  or revoked sooner.       Influenza A by PCR NEGATIVE NEGATIVE Final   Influenza B by PCR NEGATIVE NEGATIVE Final    Comment: (NOTE) The Xpert Xpress SARS-CoV-2/FLU/RSV plus assay is intended as an aid in the diagnosis of influenza from Nasopharyngeal swab specimens and should not be used as a sole basis for treatment. Nasal washings and aspirates are unacceptable for Xpert Xpress SARS-CoV-2/FLU/RSV testing.  Fact Sheet for Patients: EntrepreneurPulse.com.au  Fact Sheet for Healthcare  Providers: IncredibleEmployment.be  This test is not yet approved or cleared by the [place] FDA and has been authorized for detection and/or diagnosis of SARS-CoV-2 by FDA under an Emergency Use Authorization (EUA). This EUA will remain in effect (meaning this test can be used) for the duration of the COVID-19 declaration under Section 564(b)(1) of the Act, 21 U.S.C. section 360bbb-3(b)(1), unless the authorization is terminated or revoked.  Performed at [facility], [place] [address], [place], [place] [address]     Procedures/Studies: CUP PACEART REMOTE DEVICE CHECK  Result Date: 05/14/2021 Scheduled remote reviewed. Normal device function.  Next remote 91 days. LR  IR Radiologist Eval & Mgmt  Result Date: 04/28/2021 EXAM: ESTABLISHED PATIENT OFFICE VISIT CHIEF COMPLAINT: Generalized weakness. Current Pain Level: 1-10 HISTORY OF PRESENT ILLNESS: Patient is an 80 year old right handed gentleman who returns in follow-up accompanied by his spouse following revascularization of the right internal carotid artery with stent assisted angioplasty 03/31/2021. There were no intra procedural or periprocedural complications. He was eventually discharged to [facility] for approximately 2 weeks for rehab physical therapy. The patient was then discharged home where physical therapy has been planned for twice a week as per spouse. In the interim, the patient performs regular exercises as prescribed. Spouse reports patient being generally weak, although the patient has a good appetite and eats regularly 3 meals. The patient ambulates with a walker. Patient continues indwelling Foley catheter for which they are waiting urological consultation as to why the patient feels to urinate despite repeated attempts following Foley removal. Otherwise, overall the patient feels in reasonably good condition and being able  to perform his daily routine activities. Denies recent chills,  fever or rigors. Denies any chest pain, shortness of breath or pedal edema. Denies any coughing, wheezing or hemoptysis. Denies any abdominal pain, constipation, diarrhea or melena. Musculoskeletal as described above. Neurologically denies any headaches, nausea, vomiting, visual aberrations, speech difficulties, sensory or motor abnormalities except for as mentioned above. Past Medical History: Diagnosis * : Date . * : Allergy * : * : Rhinitis . * : Anemia * : * : NOS iron deficient and B12 deficient . * : Arthritis * : . * : AV block, Mobitz 2 * : 05/14/2020 . * : Diabetes mellitus * : * : Type 2 . * : GERD (gastroesophageal reflux disease) * : . * : Hyperlipidemia * : . * : Hypertension * : . 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 * : OSA (obstructive sleep apnea) * : * : cpap . * : Presence of permanent cardiac pacemaker * : . * : TBI (traumatic brain injury) * : Patient Active Problem List * : Diagnosis * : Date Noted . * : Carotid stenosis, symptomatic, with infarction ([employer]) * : 03/31/2021 . * : Infection of thumb * : 03/26/2021 . * : Cellulitis of left hand * : 03/25/2021 . * : Atherosclerosis of aorta ([place]) * : 02/10/2021 . * : Generalized weakness * : 12/26/2020 . * : Simple chronic bronchitis ([place]) * : 11/13/2020 . * : Iron deficiency anemia secondary to inadequate dietary iron intake * : 11/06/2020 . * : DNR (do not resuscitate) discussion * : 10/17/2020 . * : Stroke (cerebrum) ([place]) * : 09/09/2020 . * : Middle cerebral artery embolism, right * : 09/09/2020 . * : AV block, Mobitz 2 * : 05/14/2020 . 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 * : Chronic renal disease, stage 3, moderately decreased glomerular filtration rate (GFR) between 30-59 mL/min/1.73 square meter ([place]) * : 11/21/2018 . * : GERD without esophagitis * : 07/22/2017 . * : Depression * : . * : Type 2 diabetes mellitus with peripheral neuropathy ([name]) * : . * : OAB  (overactive bladder) * : 11/26/2014 . * : Routine general medical examination at a health care facility * : 04/06/2014 . * : Vitamin D deficiency * : 07/23/2010 . * : Obstructive sleep apnea * : 09/13/2008 . * : Hyperlipidemia with target LDL less than 100 * : 07/27/2007 . * : BPH (benign prostatic hyperplasia) * : 06/21/2007 . * : [facility] NEUROPATHY, ISCHEMIC * : 12/23/2006 . * : B12 deficiency anemia * : 04/15/2006 . * : Essential hypertension * : 04/15/2006 . * : Allergic rhinitis * : 04/15/2006 Past Surgical History: Procedure * : Laterality * : Date . * : APPENDECTOMY * : * : . * : arm fracture * : Left * : 2011 * : with hardware . * Trudee Kuster HOLE * : Bilateral * : 08/04/2017 * : Procedure: Haskell Flirt; Surgeon: [name], MD; Location: [facility]; Service: Neurosurgery; Laterality: Bilateral; . * : COLONOSCOPY * : * : . * : CRANIOTOMY * : Left * : 12/01/2017 * : Procedure: Trudee Kuster Holes CRANIOTOMY HEMATOMA EVACUATION SUBDURAL; Surgeon: [name], MD; Location: [facility]; Service: Neurosurgery; Laterality: Left; . * : ESOPHAGOGASTRODUODENOSCOPY * : * : 2006 * : gastritis . * : IR ANGIO INTRA EXTRACRAN SEL COM CAROTID INNOMINATE UNI L MOD SED * : * :  03/31/2021 . * : IR ANGIO VERTEBRAL SEL SUBCLAVIAN INNOMINATE UNI R MOD SED * : * : 03/31/2021 . * : IR CT HEAD LTD * : * : 03/31/2021 . * : IR INTRAVSC STENT CERV CAROTID W/EMB-PROT MOD SED INCL ANGIO * : * : 03/31/2021 . * : IR PERCUTANEOUS ART THROMBECTOMY/INFUSION INTRACRANIAL INC DIAG ANGIO * : * : 09/09/2020 . * : IR RADIOLOGIST EVAL & MGMT * : * : 03/10/2021 . * : PACEMAKER IMPLANT * : N/A * : 05/14/2020 * : Procedure: PACEMAKER IMPLANT; Surgeon: [name], MD; Location: [facility]; Service: Cardiovascular; Laterality: N/A; . * : RADIOLOGY WITH ANESTHESIA * : N/A * : 09/09/2020 * : Procedure: IR WITH ANESTHESIA; Surgeon: Radiologist, Medication, MD; Location: [facility]; Service: Radiology; Laterality: N/A; . * : RADIOLOGY WITH ANESTHESIA * : N/A * : 03/31/2021 * : Procedure:  IR WITH ANESTHESIA STENT PLACEMENT; Surgeon: Luanne Bras, MD; Location: [facility]; Service: Radiology; Laterality: N/A; . * : ROTATOR CUFF REPAIR * : * : . * : SEPTOPLASTY * : * : 1959 * : Deviated Septum . 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 Family History Problem * : Relation * : Age of Onset . * : Heart disease * : Mother * : . * : Breast cancer * : Mother * : . * : Stroke * : Father * : . * : Allergies * : Father * : * :     Father and children . * : Coronary artery disease * : Brother * : . * : Diabetes * : Neg Hx * : . * : Colon cancer * : Neg Hx * : Tobacco Use . * : Smoking status: * : Former * : * : Packs/day: * : 1.00 * : * : Types: * : Cigarettes * : * : Quit date: * : 07/14/1983 * : * : Years since quitting: * : 37.7 . * : Smokeless tobacco: * : Never Vaping Use . * : Vaping Use: * : Never used Substance Use Topics . * : Alcohol use: * : Yes * : * : Alcohol/week: * : 2.0 standard drinks * : * : Types: * : 2 Standard drinks or equivalent per week * : * : Comment: occassionally . * : Drug use: * : No Medications: acetaminophen 500 MG tablet commonly known as: TYLENOL take 500-1,000 mg by mouth every 6 (six) hours as needed for moderate pain or headache. amLODipine 10 MG tablet commonly known as: NORVASC take 1 tablet (10 mg total) by mouth daily. What changed: when to take this aspirin 81 MG chewable tablet chew 1 tablet (81 mg total) by mouth daily. atorvastatin 10 MG tablet commonly known as: LIPITOR take 1 tablet (10 mg total) by mouth daily. docusate sodium 100 MG capsule commonly known as: COLACE take 1 capsule (100 mg total) by mouth 2 (two) times daily. FLUoxetine 20 MG capsule commonly known as: PROZAC TAKE 1 CAPSULE BY MOUTH DAILY fluticasone 50 MCG/ACT nasal spray commonly known as: FLONASE place 1-2 sprays into both nostrils daily as needed for allergies or rhinitis. glipiZIDE 10 MG 24 hr tablet commonly known as: GLUCOTROL XL TAKE 1 TABLET BY MOUTH DAILY Insulin Pen Needle 32G X  6 MM Misc1 Act by Does not apply route daily. levETIRAcetam 500 MG tablet commonly known as: KEPPRA take 1 tablet (500 mg total) by mouth 2 (two) times daily. pantoprazole 40 MG  tablet commonly known as: PROTONIX TAKE 1 TABLET BY MOUTH DAILY PRESCRIPTION MEDICATION CPAP- At bedtime solifenacin 10 MG tablet commonly known as: VESICARE TAKE 1 TABLET BY MOUTH DAILY tamsulosin 0.4 MG Caps capsule commonly known as: Flomax take 1 capsule (0.4 mg total) by mouth daily after supper. ticagrelor 90 MG Tabs tablet commonly known as: BRILINTA take 0.5 tablets (45 mg total) by mouth 2 (two) times daily. Toujeo Max SoloStar 300 UNIT/ML Solostar PenGeneric drug: insulin glargine (2 Unit Dial)Inject 10 Units into the skin daily. Allergies: * Lisinopril: Invokana canagliflozin * Cough: Other (See Comments) * Lisinopril: Stopped by MD, unknown reaction * Lisinopril: Penicillins * Cough: Other (See Comments) * Lisinopril: Allergic as a child. Patient does not remember reaction. Has had cephalasporins without problems. * Lisinopril: Sulfamethoxazole * Cough: Other (See Comments) * Lisinopril: Unknown reaction- from childhood REVIEW OF SYSTEMS: As above. PHYSICAL EXAMINATION: Appears awake, alert, oriented to time, place, space. Affect appropriate. In no acute distress. Responses prompt and appropriate. Patient able to remember what he ate the previous night, and for breakfast correctly. Intermittently exhibits pin rolling movement of his right hand intermittently. No evidence of increased rigidity or cogwheeling in the extremities. Eye range of movements symmetric without subjective or objective diplopia, nystagmus, or restriction with superior eye gaze. Station and gait not tested. ASSESSMENT AND PLAN: Will check platelet inhibition with a P2Y12 check today. Patient advised to continue taking his dual antiplatelets as of right now. Ultrasound of the carotids in 4 to 6 months time. Continue with PT OT twice a week as planned according  to the spouse. Awaiting urology appointment in regards to the need for the urinary catheter. Electronically Signed   By: Luanne Bras M.D.   On: 04/28/2021 08:22    Labs: BNP (last 3 results) No results for input(s): BNP in the last 8760 hours. Basic Metabolic Panel: Recent Labs  Lab 05/19/21 0431 05/20/21 0344  NA 136 135  K 4.1 3.5  CL 102 101  CO2 23 25  GLUCOSE 183* 60*  BUN 26* 19  CREATININE 1.56* 1.17  CALCIUM 8.9 8.3*   Liver Function Tests: Recent Labs  Lab 05/19/21 0431  AST 18  ALT 18  ALKPHOS 70  BILITOT 0.8  PROT 6.5  ALBUMIN 3.2*   No results for input(s): LIPASE, AMYLASE in the last 168 hours. No results for input(s): AMMONIA in the last 168 hours. CBC: Recent Labs  Lab 05/19/21 0431 05/20/21 0344 05/21/21 0809  WBC 17.7* 12.4* 8.1  NEUTROABS 14.6*  --   --   HGB 12.4* 10.4* 10.3*  HCT 38.0* 32.9* 32.4*  MCV 82.4 84.1 83.7  PLT 189 164 150   Cardiac Enzymes: No results for input(s): CKTOTAL, CKMB, CKMBINDEX, TROPONINI in the last 168 hours. BNP: Invalid input(s): POCBNP CBG: Recent Labs  Lab 05/20/21 0812 05/20/21 1048 05/20/21 1611 05/20/21 2117 05/21/21 0641  GLUCAP 59* 156* 222* 164* 141*   D-Dimer No results for input(s): DDIMER in the last 72 hours. Hgb A1c Recent Labs    05/19/21 0431  HGBA1C 7.1*   Lipid Profile No results for input(s): CHOL, HDL, LDLCALC, TRIG, CHOLHDL, LDLDIRECT in the last 72 hours. Thyroid function studies No results for input(s): TSH, T4TOTAL, T3FREE, THYROIDAB in the last 72 hours.  Invalid input(s): FREET3 Anemia work up No results for input(s): VITAMINB12, FOLATE, FERRITIN, TIBC, IRON, RETICCTPCT in the last 72 hours. Urinalysis    Component Value Date/Time   COLORURINE YELLOW 05/19/2021 0435   APPEARANCEUR CLOUDY (  A) 05/19/2021 0435   LABSPEC 1.015 05/19/2021 0435   PHURINE 7.0 05/19/2021 0435   GLUCOSEU NEGATIVE 05/19/2021 0435   GLUCOSEU NEGATIVE 12/26/2020 1600   HGBUR SMALL  (A) 05/19/2021 0435   BILIRUBINUR NEGATIVE 05/19/2021 0435   BILIRUBINUR n 07/19/2013 1136   KETONESUR NEGATIVE 05/19/2021 0435   PROTEINUR 100 (A) 05/19/2021 0435   UROBILINOGEN 0.2 12/26/2020 1600   NITRITE NEGATIVE 05/19/2021 0435   LEUKOCYTESUR LARGE (A) 05/19/2021 0435   Sepsis Labs Invalid input(s): PROCALCITONIN,  WBC,  LACTICIDVEN Microbiology Recent Results (from the past 240 hour(s))  Urine Culture     Status: Abnormal   Collection Time: 05/19/21  4:26 AM   Specimen: Urine, Catheterized  Result Value Ref Range Status   Specimen Description URINE, CATHETERIZED  Final   Special Requests   Final    NONE Performed at [facility], [address] [place], [place] [address]    Culture >=100,000 COLONIES/mL PSEUDOMONAS AERUGINOSA (A)  Final   Report Status 05/20/2021 FINAL  Final   Organism ID, Bacteria PSEUDOMONAS AERUGINOSA (A)  Final      Susceptibility   Pseudomonas aeruginosa - MIC*    CEFTAZIDIME 4 SENSITIVE Sensitive     CIPROFLOXACIN 0.5 SENSITIVE Sensitive     GENTAMICIN <=1 SENSITIVE Sensitive     IMIPENEM 2 SENSITIVE Sensitive     PIP/TAZO 8 SENSITIVE Sensitive     CEFEPIME 2 SENSITIVE Sensitive     * >=100,000 COLONIES/mL PSEUDOMONAS AERUGINOSA  Culture, blood (routine x 2)     Status: None (Preliminary result)   Collection Time: 05/19/21  4:31 AM   Specimen: BLOOD RIGHT ARM  Result Value Ref Range Status   Specimen Description BLOOD RIGHT ARM  Final   Special Requests   Final    AEROBIC BOTTLE ONLY Blood Culture results may not be optimal due to an inadequate volume of blood received in culture bottles   Culture   Final    NO GROWTH 2 DAYS Performed at [facility], [place] [address], [place], [place] [address]    Report Status PENDING  Incomplete  Culture, blood (routine x 2)     Status: None (Preliminary result)   Collection Time: 05/19/21  4:31 AM   Specimen: BLOOD RIGHT HAND  Result Value Ref Range Status   Specimen Description BLOOD  RIGHT HAND  Final   Special Requests   Final    BOTTLES DRAWN AEROBIC AND ANAEROBIC Blood Culture results may not be optimal due to an inadequate volume of blood received in culture bottles   Culture   Final    NO GROWTH 2 DAYS Performed at [facility], [place] [address] [place], [place] [address]    Report Status PENDING  Incomplete  Resp Panel by RT-PCR (Flu A&B, Covid) Nasopharyngeal Swab     Status: None   Collection Time: 05/19/21  8:33 AM   Specimen: Nasopharyngeal Swab; Nasopharyngeal(NP) swabs in vial transport medium  Result Value Ref Range Status   SARS Coronavirus 2 by RT PCR NEGATIVE NEGATIVE Final    Comment: (NOTE) SARS-CoV-2 target nucleic acids are NOT DETECTED.  The SARS-CoV-2 RNA is generally detectable in upper respiratory specimens during the acute phase of infection. The lowest concentration of SARS-CoV-2 viral copies this assay can detect is 138 copies/mL. A negative result does not preclude SARS-Cov-2 infection and should not be used as the sole basis for treatment or other patient management decisions. A negative result may occur with  improper specimen collection/handling, submission of specimen other than nasopharyngeal swab, presence of viral mutation(s) within the areas targeted by this assay, and inadequate number of viral copies(<138 copies/mL). A negative result must be combined with clinical observations, patient history, and epidemiological information. The expected result is Negative.  Fact Sheet for Patients:  EntrepreneurPulse.com.au  Fact Sheet for Healthcare Providers:  IncredibleEmployment.be  This test is no t yet approved or cleared by the [place] FDA and  has been authorized for detection and/or diagnosis of SARS-CoV-2 by FDA under an Emergency Use Authorization (EUA). This EUA will remain  in effect (meaning this test can be used) for the duration of the COVID-19 declaration under Section  564(b)(1) of the Act, 21 U.S.C.section 360bbb-3(b)(1), unless the authorization is terminated  or revoked sooner.       Influenza A by PCR NEGATIVE NEGATIVE Final   Influenza B by PCR NEGATIVE NEGATIVE Final    Comment: (NOTE) The Xpert Xpress SARS-CoV-2/FLU/RSV plus assay is intended as an aid in the diagnosis of influenza from Nasopharyngeal swab specimens and should not be used as a sole basis for treatment. Nasal washings and aspirates are unacceptable for Xpert Xpress SARS-CoV-2/FLU/RSV testing.  Fact Sheet for Patients: EntrepreneurPulse.com.au  Fact Sheet for Healthcare Providers: IncredibleEmployment.be  This test is not yet approved or cleared by the [place] FDA and has been authorized for detection and/or diagnosis of SARS-CoV-2 by FDA under an Emergency Use Authorization (EUA). This EUA will remain in effect (meaning this test can be used) for the duration of the COVID-19 declaration under Section 564(b)(1) of the Act, 21 U.S.C. section 360bbb-3(b)(1), unless the authorization is terminated or revoked.  Performed at [facility], [place] [address] [place], [place] [address]      Time coordinating discharge: 25 minutes  SIGNED: Antonieta Pert, MD  [facility] 05/21/2021, 10:47 AM  If 7PM-7AM, please contact night-coverage [email or web address]

## 2021-05-22 ENCOUNTER — Telehealth: Payer: Self-pay

## 2021-05-22 NOTE — Telephone Encounter (Signed)
Transition Care Management Unsuccessful Follow-up Telephone Call  Date of discharge and from where:  05-21-21  Frank [name]   Attempts:  1st Attempt  Reason for unsuccessful TCM follow-up call:  Unable to leave message- mailbox full

## 2021-05-23 ENCOUNTER — Telehealth: Payer: Self-pay | Admitting: Internal Medicine

## 2021-05-23 NOTE — Telephone Encounter (Signed)
Transition Care Management Follow-up Telephone Call Date of discharge and from where: 05/21/21 Frank [name] How have you been since you were released from the hospital? Pt's wife Frank [name] states pt has been feeling weak but has been eating and resting. She also states he has been more confused.  Any questions or concerns? No  Items Reviewed: Did the pt receive and understand the discharge instructions provided? Yes  Medications obtained and verified? Yes  Other? No  Any new allergies since your discharge? No  Dietary orders reviewed? Yes Do you have support at home? Yes   Home Care and Equipment/Supplies: Were home health services ordered? No; however pt's wife Frank [name] states hospice requested and per orders in chart today Dr. Ronnald Ramp will be attending and verbal orders given to [place] Were any new equipment or medical supplies ordered?  No   Functional Questionnaire: (I = Independent and D = Dependent) ADLs: D  Bathing/Dressing- D  Meal Prep- D  Eating- I  Maintaining continence- D  Transferring/Ambulation- D  Managing Meds- D  Follow up appointments reviewed:  PCP Hospital f/u appt confirmed? Yes  Scheduled to see Dr. [name] on 05/27/21 @ 11:20. [facility] f/u appt confirmed? Yes  Scheduled to see [facility] on 06/19/21 . Are transportation arrangements needed? No  If their condition worsens, is the pt aware to call PCP or go to the Emergency Dept.? Yes Was the patient provided with contact information for the PCP's office or ED? Yes Was to pt encouraged to call back with questions or concerns? Yes

## 2021-05-23 NOTE — Telephone Encounter (Signed)
[facility] [phone]  Patient's health is rapidly declining, need hospice order  Requesting Dr. Ronnald Ramp be the attending  Please fax order to [phone]

## 2021-05-23 NOTE — Telephone Encounter (Signed)
Verbal order given, PCP will be attending.

## 2021-05-24 LAB — CULTURE, BLOOD (ROUTINE X 2)
Culture: NO GROWTH
Culture: NO GROWTH

## 2021-05-27 ENCOUNTER — Inpatient Hospital Stay: Payer: Medicare Other | Admitting: Internal Medicine

## 2021-05-30 ENCOUNTER — Ambulatory Visit (INDEPENDENT_AMBULATORY_CARE_PROVIDER_SITE_OTHER)

## 2021-05-30 DIAGNOSIS — D510 Vitamin B12 deficiency anemia due to intrinsic factor deficiency: Secondary | ICD-10-CM | POA: Diagnosis not present

## 2021-05-30 MED ORDER — CYANOCOBALAMIN 1000 MCG/ML IJ SOLN
1000.0000 ug | Freq: Once | INTRAMUSCULAR | Status: AC
  Administered 2021-05-30: 1000 ug via INTRAMUSCULAR

## 2021-05-30 NOTE — Progress Notes (Signed)
Pt given B12 w/o any complications. °

## 2021-06-02 ENCOUNTER — Ambulatory Visit: Payer: Medicare Other | Admitting: Physician Assistant

## 2021-06-11 ENCOUNTER — Encounter: Payer: Self-pay | Admitting: Podiatry

## 2021-06-11 ENCOUNTER — Ambulatory Visit (INDEPENDENT_AMBULATORY_CARE_PROVIDER_SITE_OTHER): Payer: Medicare Other | Admitting: Podiatry

## 2021-06-11 ENCOUNTER — Other Ambulatory Visit: Payer: Self-pay

## 2021-06-11 DIAGNOSIS — E1149 Type 2 diabetes mellitus with other diabetic neurological complication: Secondary | ICD-10-CM | POA: Diagnosis not present

## 2021-06-11 DIAGNOSIS — E1140 Type 2 diabetes mellitus with diabetic neuropathy, unspecified: Secondary | ICD-10-CM | POA: Diagnosis not present

## 2021-06-11 DIAGNOSIS — B351 Tinea unguium: Secondary | ICD-10-CM | POA: Diagnosis not present

## 2021-06-11 DIAGNOSIS — M79675 Pain in left toe(s): Secondary | ICD-10-CM

## 2021-06-11 DIAGNOSIS — M79674 Pain in right toe(s): Secondary | ICD-10-CM | POA: Diagnosis not present

## 2021-06-11 NOTE — Progress Notes (Signed)
Subjective:   Patient ID: Frank [name], male   DOB: 80 y.o.   MRN: [id number]   HPI Patient presents stating he has had increased tingling and burning in both feet and up to through his legs and he is concerned about this and had a stroke 6 weeks ago.  He also has severe nail disease 1-5 both feet that are thick dystrophic and painful and he cannot take care of and has long-term history of diabetes   ROS      Objective:  Physical Exam  Vascular status moderately diminished but intact diminished range of motion and muscle strength noted bilateral.  He does have diminishment of sharp dull vibratory DTR reflexes bilateral and is found to have thick yellow brittle nailbeds 1-5 both feet and they are painful and is also in physical therapy currently     Assessment:  Patient who has had significant increase in health issues with mycotic nail infection with discomfort and also neuropathic pain which also may be due to the stroke that he experienced     Plan:  H&P reviewed all conditions with him and caregiver and at this point debrided nailbeds 1-5 both feet.  We discussed the neuropathy and I am hoping it will settle down on its own but we will get a keep an eye on this and if not we will have to consider medication but I do not want to do anything else to create instability for him.  He will be seen back as needed with all questions answered today

## 2021-06-12 ENCOUNTER — Ambulatory Visit: Payer: Medicare Other | Admitting: Internal Medicine

## 2021-06-17 ENCOUNTER — Telehealth: Payer: Medicare Other

## 2021-06-17 ENCOUNTER — Telehealth: Payer: Self-pay

## 2021-06-17 NOTE — Progress Notes (Signed)
    Chronic Care Management Pharmacy Assistant   Name: [place] Pettengill  MRN: [id number] DOB: 10/17/1940  Spoke with patient wife  Vernell Townley, who confirmed that patient is in palliative care. She did ask if I will ask clinical pharmacist if he could see if the patient could get one last refill on the Ferric Maltol for 30 ds. I told her that I would pass along the message to [facility] the clinical pharmacist.  Ethelene Hal Clinical Pharmacist Assistant [phone]

## 2021-06-19 DIAGNOSIS — Z950 Presence of cardiac pacemaker: Secondary | ICD-10-CM | POA: Diagnosis not present

## 2021-06-19 DIAGNOSIS — E1122 Type 2 diabetes mellitus with diabetic chronic kidney disease: Secondary | ICD-10-CM | POA: Diagnosis not present

## 2021-06-19 DIAGNOSIS — G4733 Obstructive sleep apnea (adult) (pediatric): Secondary | ICD-10-CM | POA: Diagnosis not present

## 2021-06-19 DIAGNOSIS — F32A Depression, unspecified: Secondary | ICD-10-CM | POA: Diagnosis not present

## 2021-06-19 DIAGNOSIS — Z87891 Personal history of nicotine dependence: Secondary | ICD-10-CM | POA: Diagnosis not present

## 2021-06-19 DIAGNOSIS — N183 Chronic kidney disease, stage 3 unspecified: Secondary | ICD-10-CM | POA: Diagnosis not present

## 2021-06-19 DIAGNOSIS — Z79899 Other long term (current) drug therapy: Secondary | ICD-10-CM | POA: Diagnosis not present

## 2021-06-19 DIAGNOSIS — I129 Hypertensive chronic kidney disease with stage 1 through stage 4 chronic kidney disease, or unspecified chronic kidney disease: Secondary | ICD-10-CM | POA: Diagnosis not present

## 2021-06-19 DIAGNOSIS — R339 Retention of urine, unspecified: Secondary | ICD-10-CM | POA: Diagnosis not present

## 2021-06-19 DIAGNOSIS — E1142 Type 2 diabetes mellitus with diabetic polyneuropathy: Secondary | ICD-10-CM | POA: Diagnosis not present

## 2021-06-19 DIAGNOSIS — I10 Essential (primary) hypertension: Secondary | ICD-10-CM | POA: Diagnosis not present

## 2021-06-19 DIAGNOSIS — R338 Other retention of urine: Secondary | ICD-10-CM | POA: Diagnosis not present

## 2021-06-19 DIAGNOSIS — D508 Other iron deficiency anemias: Secondary | ICD-10-CM | POA: Diagnosis not present

## 2021-06-19 DIAGNOSIS — E785 Hyperlipidemia, unspecified: Secondary | ICD-10-CM | POA: Diagnosis not present

## 2021-07-02 ENCOUNTER — Ambulatory Visit (INDEPENDENT_AMBULATORY_CARE_PROVIDER_SITE_OTHER)

## 2021-07-02 ENCOUNTER — Other Ambulatory Visit: Payer: Self-pay

## 2021-07-02 DIAGNOSIS — D510 Vitamin B12 deficiency anemia due to intrinsic factor deficiency: Secondary | ICD-10-CM

## 2021-07-02 MED ORDER — CYANOCOBALAMIN 1000 MCG/ML IJ SOLN
1000.0000 ug | Freq: Once | INTRAMUSCULAR | Status: AC
  Administered 2021-07-02: 15:00:00 1000 ug via INTRAMUSCULAR

## 2021-07-02 NOTE — Progress Notes (Signed)
Pt was given B12 w/o any complications. 

## 2021-07-08 ENCOUNTER — Other Ambulatory Visit: Payer: Self-pay | Admitting: Internal Medicine

## 2021-07-08 ENCOUNTER — Telehealth: Payer: Self-pay | Admitting: Internal Medicine

## 2021-07-08 DIAGNOSIS — D508 Other iron deficiency anemias: Secondary | ICD-10-CM

## 2021-07-08 MED ORDER — ACCRUFER 30 MG PO CAPS: 1.0000 | ORAL_CAPSULE | Freq: Two times a day (BID) | ORAL | 1 refills | Status: DC

## 2021-07-08 NOTE — Telephone Encounter (Signed)
1.Medication Requested: Ferric Maltol (ACCRUFER) 30 MG CAPS  2. Pharmacy (Name, Street, [place]): [facility], [place], [address]  3. On Med List: yes   4. Last Visit with PCP: 04-24-2021 [name])  5. Next visit date with PCP: n/a   Agent: Please be advised that RX refills may take up to 3 business days. We ask that you follow-up with your pharmacy.

## 2021-07-17 ENCOUNTER — Telehealth: Payer: Self-pay

## 2021-07-17 NOTE — Telephone Encounter (Signed)
Pt wife is requesting Dr. Ronnald Ramp to submit a letter to [employer] stating the patients health condition has decline with mobility and unable to transport in the event of a power outage. Also in the care of Hospice.  [name] is requesting that she can be informed of the outcome via [email or web address] contact number [phone]

## 2021-07-20 ENCOUNTER — Other Ambulatory Visit: Payer: Self-pay | Admitting: Internal Medicine

## 2021-07-20 DIAGNOSIS — I6601 Occlusion and stenosis of right middle cerebral artery: Secondary | ICD-10-CM

## 2021-07-21 NOTE — Telephone Encounter (Signed)
Pt's wife, [name], has been informed and expressed understanding. She will request the form from [employer].

## 2021-07-24 ENCOUNTER — Ambulatory Visit: Payer: Medicare Other | Admitting: Internal Medicine

## 2021-07-28 NOTE — Telephone Encounter (Signed)
[employer] form received via drop office.   Form has been completed and faxed back.

## 2021-08-02 ENCOUNTER — Other Ambulatory Visit: Payer: Self-pay | Admitting: Internal Medicine

## 2021-08-04 ENCOUNTER — Ambulatory Visit (INDEPENDENT_AMBULATORY_CARE_PROVIDER_SITE_OTHER)

## 2021-08-04 DIAGNOSIS — E538 Deficiency of other specified B group vitamins: Secondary | ICD-10-CM

## 2021-08-04 MED ORDER — CYANOCOBALAMIN 1000 MCG/ML IJ SOLN
1000.0000 ug | Freq: Once | INTRAMUSCULAR | Status: AC
  Administered 2021-08-04: 1000 ug via INTRAMUSCULAR

## 2021-08-04 NOTE — Progress Notes (Signed)
B12 given and tolerated well °

## 2021-08-12 DIAGNOSIS — D485 Neoplasm of uncertain behavior of skin: Secondary | ICD-10-CM | POA: Diagnosis not present

## 2021-08-12 DIAGNOSIS — L309 Dermatitis, unspecified: Secondary | ICD-10-CM | POA: Diagnosis not present

## 2021-08-12 DIAGNOSIS — D1801 Hemangioma of skin and subcutaneous tissue: Secondary | ICD-10-CM | POA: Diagnosis not present

## 2021-08-12 DIAGNOSIS — L570 Actinic keratosis: Secondary | ICD-10-CM | POA: Diagnosis not present

## 2021-08-12 DIAGNOSIS — L821 Other seborrheic keratosis: Secondary | ICD-10-CM | POA: Diagnosis not present

## 2021-08-12 DIAGNOSIS — Z85828 Personal history of other malignant neoplasm of skin: Secondary | ICD-10-CM | POA: Diagnosis not present

## 2021-08-13 ENCOUNTER — Ambulatory Visit (INDEPENDENT_AMBULATORY_CARE_PROVIDER_SITE_OTHER)

## 2021-08-13 DIAGNOSIS — I441 Atrioventricular block, second degree: Secondary | ICD-10-CM | POA: Diagnosis not present

## 2021-08-13 LAB — CUP PACEART REMOTE DEVICE CHECK
Battery Remaining Longevity: 138 mo
Battery Voltage: 3.04 V
Brady Statistic AP VP Percent: 0.31 %
Brady Statistic AP VS Percent: 0 %
Brady Statistic AS VP Percent: 99.55 %
Brady Statistic AS VS Percent: 0.14 %
Brady Statistic RA Percent Paced: 0.33 %
Brady Statistic RV Percent Paced: 99.86 %
Date Time Interrogation Session: 20230201044608
Implantable Lead Implant Date: 20211102
Implantable Lead Implant Date: 20211102
Implantable Lead Location: 753859
Implantable Lead Location: 753860
Implantable Lead Model: 3830
Implantable Lead Model: 5076
Implantable Pulse Generator Implant Date: 20211102
Lead Channel Impedance Value: 323 Ohm
Lead Channel Impedance Value: 361 Ohm
Lead Channel Impedance Value: 418 Ohm
Lead Channel Impedance Value: 456 Ohm
Lead Channel Pacing Threshold Amplitude: 0.5 V
Lead Channel Pacing Threshold Amplitude: 0.875 V
Lead Channel Pacing Threshold Pulse Width: 0.4 ms
Lead Channel Pacing Threshold Pulse Width: 0.4 ms
Lead Channel Sensing Intrinsic Amplitude: 3 mV
Lead Channel Sensing Intrinsic Amplitude: 3 mV
Lead Channel Sensing Intrinsic Amplitude: 9.125 mV
Lead Channel Sensing Intrinsic Amplitude: 9.125 mV
Lead Channel Setting Pacing Amplitude: 1.5 V
Lead Channel Setting Pacing Amplitude: 2 V
Lead Channel Setting Pacing Pulse Width: 0.4 ms
Lead Channel Setting Sensing Sensitivity: 1.2 mV

## 2021-08-20 ENCOUNTER — Encounter: Payer: Self-pay | Admitting: Podiatry

## 2021-08-20 ENCOUNTER — Ambulatory Visit: Payer: Medicare Other | Admitting: Podiatry

## 2021-08-20 ENCOUNTER — Other Ambulatory Visit: Payer: Self-pay

## 2021-08-20 DIAGNOSIS — M79675 Pain in left toe(s): Secondary | ICD-10-CM

## 2021-08-20 DIAGNOSIS — L03032 Cellulitis of left toe: Secondary | ICD-10-CM

## 2021-08-20 DIAGNOSIS — B351 Tinea unguium: Secondary | ICD-10-CM

## 2021-08-20 DIAGNOSIS — M79674 Pain in right toe(s): Secondary | ICD-10-CM

## 2021-08-20 NOTE — Progress Notes (Signed)
Remote pacemaker transmission.   

## 2021-08-20 NOTE — Patient Instructions (Signed)

## 2021-08-23 ENCOUNTER — Other Ambulatory Visit: Payer: Self-pay | Admitting: Internal Medicine

## 2021-08-23 DIAGNOSIS — R569 Unspecified convulsions: Secondary | ICD-10-CM

## 2021-08-25 NOTE — Progress Notes (Signed)
Subjective:   Patient ID: Frank [name], male   DOB: 81 y.o.   MRN: [id number]   HPI Patient presents stating that he has developed drainage in his third digit of his left foot he is currently in a wheelchair with a primary caregiver and states that he must of traumatized it and its been sore.  Also all of his nails are thick yellow brittle and he cannot cut them and they get tender in shoe gear   ROS      Objective:  Physical Exam  Neurovascular status was found to be unchanged diminishment of PT DP pulses diminishment in neurological with patient currently in wheelchair with the third nailbed left found to be draining and loose with no proximal edema erythema drainage noted.  Patient has elongated nailbeds of adjacent nails     Assessment:  Paronychia infection of the left third toe with mycotic nail infection nails 1 through 5 right and 1-4 5 left with pain     Plan:  H&P reviewed all conditions and for the third nail left I anesthetized the digit sterile prep applied and carefully removed the nail flush the bed applied sterile dressing with low-grade paronychia infection and advised on open toed shoes.  I debrided all other nails no iatrogenic bleeding noted reappoint to recheck

## 2021-08-28 ENCOUNTER — Other Ambulatory Visit: Payer: Self-pay | Admitting: Internal Medicine

## 2021-08-28 DIAGNOSIS — N3281 Overactive bladder: Secondary | ICD-10-CM

## 2021-09-08 ENCOUNTER — Other Ambulatory Visit: Payer: Self-pay

## 2021-09-08 ENCOUNTER — Ambulatory Visit (INDEPENDENT_AMBULATORY_CARE_PROVIDER_SITE_OTHER): Admitting: *Deleted

## 2021-09-08 DIAGNOSIS — D510 Vitamin B12 deficiency anemia due to intrinsic factor deficiency: Secondary | ICD-10-CM

## 2021-09-08 MED ORDER — CYANOCOBALAMIN 1000 MCG/ML IJ SOLN
1000.0000 ug | Freq: Once | INTRAMUSCULAR | Status: AC
  Administered 2021-09-08: 1000 ug via INTRAMUSCULAR

## 2021-09-08 NOTE — Progress Notes (Signed)
Patient is here for a b12 injection. Given in left deltoid. Patient tolerated well   Please co sign

## 2021-09-09 ENCOUNTER — Other Ambulatory Visit: Payer: Self-pay | Admitting: Internal Medicine

## 2021-09-09 DIAGNOSIS — E1142 Type 2 diabetes mellitus with diabetic polyneuropathy: Secondary | ICD-10-CM

## 2021-09-09 DIAGNOSIS — E119 Type 2 diabetes mellitus without complications: Secondary | ICD-10-CM

## 2021-09-10 ENCOUNTER — Other Ambulatory Visit: Payer: Self-pay | Admitting: Internal Medicine

## 2021-09-10 DIAGNOSIS — E119 Type 2 diabetes mellitus without complications: Secondary | ICD-10-CM

## 2021-09-10 DIAGNOSIS — E1142 Type 2 diabetes mellitus with diabetic polyneuropathy: Secondary | ICD-10-CM

## 2021-09-10 MED ORDER — GLIPIZIDE ER 10 MG PO TB24: 10.0000 mg | ORAL_TABLET | Freq: Every day | ORAL | 0 refills | Status: DC

## 2021-09-25 ENCOUNTER — Other Ambulatory Visit (INDEPENDENT_AMBULATORY_CARE_PROVIDER_SITE_OTHER)

## 2021-09-25 ENCOUNTER — Telehealth: Payer: Self-pay | Admitting: Internal Medicine

## 2021-09-25 ENCOUNTER — Other Ambulatory Visit: Payer: Self-pay | Admitting: Internal Medicine

## 2021-09-25 DIAGNOSIS — R310 Gross hematuria: Secondary | ICD-10-CM

## 2021-09-25 LAB — URINALYSIS, ROUTINE W REFLEX MICROSCOPIC
Bilirubin Urine: NEGATIVE
Nitrite: NEGATIVE
Specific Gravity, Urine: 1.025 (ref 1.000–1.030)
Total Protein, Urine: >=300 — AB
Urine Glucose: 250 — AB
Urobilinogen, UA: 0.2 (ref 0.0–1.0)
pH: 7.5 (ref 5.0–8.0)

## 2021-09-25 NOTE — Telephone Encounter (Signed)
Patient wife calling in ? ?Says when draining patient catheter she has noticed blood in urine twice ? ?Says patient has not been feeling well & is not able to come in for visit.[name] wants to know if provider is willing to place lab order for her to bring in urine sample ? ?Please fu [phone] ?

## 2021-09-25 NOTE — Telephone Encounter (Signed)
Pt's wife, [name], has been informed and will come by to pick up sterile cup.  ?

## 2021-09-25 NOTE — Telephone Encounter (Signed)
Patient wife [name] calling in ? ?Says she was not able to bring patient sample over until 4:00 this afternoon ? ?Wants to know if provider or nurse can place a STAT/URGENT rush on the lab order so if patient does need antibiotic he can have it before the weekend  ? ?Please fu [phone] ?

## 2021-09-26 ENCOUNTER — Other Ambulatory Visit: Payer: Self-pay | Admitting: Internal Medicine

## 2021-09-26 DIAGNOSIS — E118 Type 2 diabetes mellitus with unspecified complications: Secondary | ICD-10-CM

## 2021-09-26 DIAGNOSIS — N3001 Acute cystitis with hematuria: Secondary | ICD-10-CM | POA: Insufficient documentation

## 2021-09-26 DIAGNOSIS — E785 Hyperlipidemia, unspecified: Secondary | ICD-10-CM

## 2021-09-26 MED ORDER — NITROFURANTOIN MONOHYD MACRO 100 MG PO CAPS: 100.0000 mg | ORAL_CAPSULE | Freq: Two times a day (BID) | ORAL | 0 refills | Status: AC

## 2021-09-27 DIAGNOSIS — R531 Weakness: Secondary | ICD-10-CM | POA: Diagnosis not present

## 2021-09-28 ENCOUNTER — Other Ambulatory Visit: Payer: Self-pay

## 2021-09-28 ENCOUNTER — Encounter (HOSPITAL_COMMUNITY): Payer: Self-pay

## 2021-09-28 ENCOUNTER — Emergency Department (HOSPITAL_COMMUNITY)
Admission: EM | Admit: 2021-09-28 | Discharge: 2021-09-28 | Disposition: A | Discharge status: Home / Self Care | Attending: Emergency Medicine | Admitting: Emergency Medicine

## 2021-09-28 DIAGNOSIS — R5383 Other fatigue: Secondary | ICD-10-CM | POA: Diagnosis not present

## 2021-09-28 DIAGNOSIS — R739 Hyperglycemia, unspecified: Secondary | ICD-10-CM | POA: Diagnosis not present

## 2021-09-28 DIAGNOSIS — Z7982 Long term (current) use of aspirin: Secondary | ICD-10-CM | POA: Insufficient documentation

## 2021-09-28 DIAGNOSIS — Z7984 Long term (current) use of oral hypoglycemic drugs: Secondary | ICD-10-CM | POA: Insufficient documentation

## 2021-09-28 DIAGNOSIS — I129 Hypertensive chronic kidney disease with stage 1 through stage 4 chronic kidney disease, or unspecified chronic kidney disease: Secondary | ICD-10-CM | POA: Diagnosis not present

## 2021-09-28 DIAGNOSIS — E1122 Type 2 diabetes mellitus with diabetic chronic kidney disease: Secondary | ICD-10-CM | POA: Insufficient documentation

## 2021-09-28 DIAGNOSIS — N390 Urinary tract infection, site not specified: Secondary | ICD-10-CM | POA: Insufficient documentation

## 2021-09-28 DIAGNOSIS — N183 Chronic kidney disease, stage 3 unspecified: Secondary | ICD-10-CM | POA: Insufficient documentation

## 2021-09-28 DIAGNOSIS — R6889 Other general symptoms and signs: Secondary | ICD-10-CM | POA: Diagnosis not present

## 2021-09-28 DIAGNOSIS — Z743 Need for continuous supervision: Secondary | ICD-10-CM | POA: Diagnosis not present

## 2021-09-28 DIAGNOSIS — Z794 Long term (current) use of insulin: Secondary | ICD-10-CM | POA: Diagnosis not present

## 2021-09-28 DIAGNOSIS — R410 Disorientation, unspecified: Secondary | ICD-10-CM | POA: Insufficient documentation

## 2021-09-28 DIAGNOSIS — Z7902 Long term (current) use of antithrombotics/antiplatelets: Secondary | ICD-10-CM | POA: Insufficient documentation

## 2021-09-28 DIAGNOSIS — R531 Weakness: Secondary | ICD-10-CM | POA: Diagnosis not present

## 2021-09-28 DIAGNOSIS — R0689 Other abnormalities of breathing: Secondary | ICD-10-CM | POA: Diagnosis not present

## 2021-09-28 LAB — CBC WITH DIFFERENTIAL/PLATELET
Abs Immature Granulocytes: 0.03 10*3/uL (ref 0.00–0.07)
Basophils Absolute: 0 10*3/uL (ref 0.0–0.1)
Basophils Relative: 1 %
Eosinophils Absolute: 0.3 10*3/uL (ref 0.0–0.5)
Eosinophils Relative: 4 %
HCT: 37.4 % — ABNORMAL LOW (ref 39.0–52.0)
Hemoglobin: 12 g/dL — ABNORMAL LOW (ref 13.0–17.0)
Immature Granulocytes: 0 %
Lymphocytes Relative: 16 %
Lymphs Abs: 1.2 10*3/uL (ref 0.7–4.0)
MCH: 27.3 pg (ref 26.0–34.0)
MCHC: 32.1 g/dL (ref 30.0–36.0)
MCV: 85.2 fL (ref 80.0–100.0)
Monocytes Absolute: 1 10*3/uL (ref 0.1–1.0)
Monocytes Relative: 14 %
Neutro Abs: 4.8 10*3/uL (ref 1.7–7.7)
Neutrophils Relative %: 65 %
Platelets: 160 10*3/uL (ref 150–400)
RBC: 4.39 MIL/uL (ref 4.22–5.81)
RDW: 14.5 % (ref 11.5–15.5)
WBC: 7.4 10*3/uL (ref 4.0–10.5)
nRBC: 0 % (ref 0.0–0.2)

## 2021-09-28 LAB — URINALYSIS, MICROSCOPIC (REFLEX)
RBC / HPF: >50 RBC/hpf (ref 0–5)
Squamous Epithelial / HPF: NONE SEEN (ref 0–5)
WBC, UA: >50 WBC/hpf (ref 0–5)

## 2021-09-28 LAB — COMPREHENSIVE METABOLIC PANEL
ALT: 16 U/L (ref 0–44)
AST: 15 U/L (ref 15–41)
Albumin: 3.1 g/dL — ABNORMAL LOW (ref 3.5–5.0)
Alkaline Phosphatase: 47 U/L (ref 38–126)
Anion gap: 7 (ref 5–15)
BUN: 42 mg/dL — ABNORMAL HIGH (ref 8–23)
CO2: 26 mmol/L (ref 22–32)
Calcium: 8.4 mg/dL — ABNORMAL LOW (ref 8.9–10.3)
Chloride: 103 mmol/L (ref 98–111)
Creatinine, Ser: 1.3 mg/dL — ABNORMAL HIGH (ref 0.61–1.24)
GFR, Estimated: 55 mL/min — ABNORMAL LOW (ref >60)
Glucose, Bld: 182 mg/dL — ABNORMAL HIGH (ref 70–99)
Potassium: 3.9 mmol/L (ref 3.5–5.1)
Sodium: 136 mmol/L (ref 135–145)
Total Bilirubin: 0.3 mg/dL (ref 0.3–1.2)
Total Protein: 5.8 g/dL — ABNORMAL LOW (ref 6.5–8.1)

## 2021-09-28 LAB — URINALYSIS, ROUTINE W REFLEX MICROSCOPIC
Bilirubin Urine: NEGATIVE
Glucose, UA: 100 mg/dL — AB
Ketones, ur: NEGATIVE mg/dL
Nitrite: POSITIVE — AB
Protein, ur: >300 mg/dL — AB
Specific Gravity, Urine: 1.025 (ref 1.005–1.030)
pH: 6 (ref 5.0–8.0)

## 2021-09-28 LAB — LIPASE, BLOOD: Lipase: 27 U/L (ref 11–51)

## 2021-09-28 MED ORDER — LACTATED RINGERS IV BOLUS
500.0000 mL | Freq: Once | INTRAVENOUS | Status: AC
Start: 2021-09-28 — End: 2021-09-28
  Administered 2021-09-28: 500 mL via INTRAVENOUS

## 2021-09-28 MED ORDER — SODIUM CHLORIDE 0.9 % IV SOLN
1.0000 g | INTRAVENOUS | Status: DC
  Administered 2021-09-28: 1 g via INTRAVENOUS
  Filled 2021-09-28: qty 10

## 2021-09-28 NOTE — ED Notes (Signed)
Patient and wife updated on plan of care.

## 2021-09-28 NOTE — Progress Notes (Signed)
Manufacturing engineer (ACC) ? ?Frank [name] - DOB 25-Oct-2040 was sent to the ER per son request. Son is concerned that patient is severely dehydrated. Patient also has a UTI and currently taking Macrobid.  ? ? ?Hospice will continue to follow, should he be admitted. ? ? ?If he is not admitted and ambulance service is needed, please use [facility] as they contract this service for our active hospice patients. ? ?Venia Carbon BSN, RN ?[facility] Liaison  ? ?

## 2021-09-28 NOTE — ED Notes (Signed)
Lab called to verify status current orders.  Reports samples are currently in process ?

## 2021-09-28 NOTE — ED Provider Notes (Signed)
?[facility] DEPT ?Provider Note ? ? ?CSN: [id number] ?Arrival date & time: 09/28/21  1617 ? ?  ? ?History ? ?Chief Complaint  ?Patient presents with  ? Altered Mental Status  ? ? ?Frank [name] is a 81 y.o. 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 ?History provided by both the patient's family members and hospice nursing over the phone.  Appears patient was on Macrobid but patient's family did not feel he was improving rapidly enough and requesting IV antibiotics at home.  Hospice provider declined this request as patient is hemodynamically stable and very well-appearing.  Decision was made to transition patient to ciprofloxacin, however that prescription was called in today and patient's family elected to present to the emergency department rather than pick that antibiotic up. ? ?Patient states that he is feeling somewhat fatigued but otherwise feeling his normal.  He does have suprapubic catheter in place which is draining cloudy yellow urine into Foley bag at bedside. ? ?I personally reviewed this patient's medical records.  He has history of type 2 diabetes, hyperlipidemia, hypertension, BPH, CKD stage III, Mobitz 2 AV block, carotid stenosis, and has signed DNR at the bedside.  Patient has been under care of hospice since September 2022.  He is on dual antiplatelet therapy with Brilinta and aspirin per chart review.  He is wheelchair dependent at home.  Undergoing home PT as well. ? ?HPI ? ?  ? ?Home Medications ?Prior to Admission medications   ?Medication Sig Start Date End Date Taking? Authorizing Provider  ?aspirin 81 MG chewable tablet Chew 1 tablet (81 mg total) by mouth daily. 09/18/20   Briant Cedar, MD  ?atorvastatin (LIPITOR) 10 MG tablet Take 1 tablet (10 mg total) by mouth  daily. ?Patient taking differently: Take 5 mg by mouth daily. 09/18/20   Briant Cedar, MD  ?BRILINTA 90 MG TABS tablet Take 1/2 (one-half) tablet by mouth twice daily 07/20/21   Janith Lima, MD  ?Ferric Maltol (ACCRUFER) 30 MG CAPS Take 1 capsule by mouth in the morning and at bedtime. 07/08/21   Janith Lima, MD  ?FLUoxetine (PROZAC) 20 MG capsule TAKE 1 CAPSULE BY MOUTH  DAILY 08/29/21   Janith Lima, MD  ?glipiZIDE (GLUCOTROL XL) 10 MG 24 hr tablet Take 1 tablet (10 mg total) by mouth daily. 09/10/21   Janith Lima, MD  ?insulin glargine, 2 Unit Dial, (TOUJEO MAX SOLOSTAR) 300 UNIT/ML Solostar Pen Inject 10 Units into the skin daily. 01/09/21   Janith Lima, MD  ?Insulin Pen Needle 32G X 6 MM MISC 1 Act by Does not apply route daily. 04/08/20   Janith Lima, MD  ?lactose free nutrition (BOOST) LIQD Take 237 mLs by mouth 2 (two) times daily between meals.    [provider]  ?levETIRAcetam (KEPPRA) 500 MG tablet Take 1 tablet by mouth twice daily 08/24/21   Janith Lima, MD  ?mineral oil-hydrophilic petrolatum (AQUAPHOR) ointment Apply 1 application topically as needed for dry skin.    [provider]  ?nitrofurantoin, macrocrystal-monohydrate, (MACROBID) 100 MG capsule Take 1 capsule (100 mg total) by mouth 2 (two) times daily for 5 days. 09/26/21 10/01/21  Janith Lima, MD  ?PRESCRIPTION MEDICATION CPAP- At bedtime ?Patient not taking: Reported on 05/19/2021  [provider]  ?solifenacin (VESICARE) 10 MG tablet TAKE 1 TABLET BY MOUTH  DAILY 08/29/21   Janith Lima, MD  ?tamsulosin (FLOMAX) 0.4 MG CAPS capsule Take 1 capsule (0.4 mg total) by mouth daily after supper. ?Patient not taking: No sig reported 04/03/21   Swayze, Ava, DO  ?   ? ?Allergies    ?Lisinopril, Invokana [canagliflozin], Penicillins, and Sulfamethoxazole   ? ?Review of Systems   ?Review of Systems  ?Constitutional:  Positive for fatigue.  ?All other systems reviewed and are  negative. ? ?Physical Exam ?Updated Vital Signs ?BP (!) 116/58 (BP Location: Right Arm)   Pulse 79   Temp 97.8 ?F (36.6 ?C) (Oral)   Resp 16   Ht '5\' 10"'$  (1.778 m)   Wt 79.4 kg   SpO2 98%   BMI 25.11 kg/m?  ?Physical Exam ?Vitals and nursing note reviewed.  ?Constitutional:   ?   Appearance: He is toxic-appearing. He is not ill-appearing.  ?HENT:  ?   Head: Normocephalic and atraumatic.  ?   Nose: Nose normal.  ?   Mouth/Throat:  ?   Mouth: Mucous membranes are moist.  ?   Pharynx: No oropharyngeal exudate or posterior oropharyngeal erythema.  ?Eyes:  ?   General:     ?   Right eye: No discharge.     ?   Left eye: No discharge.  ?   Extraocular Movements: Extraocular movements intact.  ?   Conjunctiva/sclera: Conjunctivae normal.  ?   Pupils: Pupils are equal, round, and reactive to light.  ?Cardiovascular:  ?   Rate and Rhythm: Normal rate and regular rhythm.  ?   Pulses: Normal pulses.  ?   Heart sounds: Normal heart sounds.  ?Pulmonary:  ?   Effort: Pulmonary effort is normal. No respiratory distress.  ?   Breath sounds: Normal breath sounds. No wheezing or rales.  ?Abdominal:  ?   General: Bowel sounds are normal. There is no distension.  ?   Palpations: Abdomen is soft.  ?   Tenderness: There is no abdominal tenderness. There is no right CVA tenderness, left CVA tenderness, guarding or rebound.  ? ? ?Musculoskeletal:     ?   General: No deformity.  ?   Cervical back: Neck supple. No tenderness.  ?   Right lower leg: No edema.  ?   Left lower leg: No edema.  ?Lymphadenopathy:  ?   Cervical: No cervical adenopathy.  ?Skin: ?   General: Skin is warm and dry.  ?   Capillary Refill: Capillary refill takes less than 2 seconds.  ?Neurological:  ?   General: No focal deficit present.  ?   Mental Status: He is alert and oriented to person, place, and time. Mental status is at baseline.  ?   GCS: GCS eye subscore is 4. GCS verbal subscore is 5. GCS motor subscore is 6.  ?   Sensory: Sensation is intact.  ?   Motor:  Motor function is intact.  ?Psychiatric:     ?   Mood and Affect: Mood normal.  ? ? ?ED Results / Procedures / Treatments   ?Labs ?(all labs ordered are listed, but only abnormal results are displayed) ?Labs Reviewed  ?CBC WITH DIFFERENTIAL/PLATELET - Abnormal; Notable for the following components:  ?    Result Value  ? Hemoglobin 12.0 (*)   ? HCT 37.4 (*)   ? All other components within normal limits  ?COMPREHENSIVE METABOLIC PANEL - Abnormal; Notable  for the following components:  ? Glucose, Bld 182 (*)   ? BUN 42 (*)   ? Creatinine, Ser 1.30 (*)   ? Calcium 8.4 (*)   ? Total Protein 5.8 (*)   ? Albumin 3.1 (*)   ? GFR, Estimated 55 (*)   ? All other components within normal limits  ?URINALYSIS, ROUTINE W REFLEX MICROSCOPIC - Abnormal; Notable for the following components:  ? Glucose, UA 100 (*)   ? Hgb urine dipstick LARGE (*)   ? Protein, ur >300 (*)   ? Nitrite POSITIVE (*)   ? Leukocytes,Ua MODERATE (*)   ? All other components within normal limits  ?URINALYSIS, MICROSCOPIC (REFLEX) - Abnormal; Notable for the following components:  ? Bacteria, UA FEW (*)   ? All other components within normal limits  ?URINE CULTURE  ?LIPASE, BLOOD  ? ? ?EKG ?None ? ?Radiology ?No results found. ? ?Procedures ?Procedures  ? ? ?Medications Ordered in ED ?Medications  ?cefTRIAXone (ROCEPHIN) 1 g in sodium chloride 0.9 % 100 mL IVPB (0 g Intravenous Stopped 09/28/21 2126)  ?lactated ringers bolus 500 mL (0 mLs Intravenous Stopped 09/28/21 2132)  ? ? ?ED Course/ Medical Decision Making/ A&P ?Clinical Course as of 09/28/21 2206  ?Sun Sep 28, 2021  ?1746 Consult placed to [facility] care; pending call back.  [RS]  ?51 Unable to contact patient's wife via phone.  [RS]  ?[place] Call to patient's son; Patient started on Macrobid a few days ago for UTI with hematuria. Changed to cipro, but this has not been started yet. Per son, patient is more confused than normal, disoriented to time. Wheelchair bound at baseline;  [RS]  ?[place] call received from a [facility] RN who states that plan was to manage patient in the home today as he was very well-appearing her family preferred to come to the emergency department for a second opinion.  Concern was that pat

## 2021-09-28 NOTE — ED Triage Notes (Signed)
Pt BIB [facility] from home. Pt on hospice with [facility] and is currently being treated for UTI. Was on macrobid and was just switched to cipro. Has not started cipro yet. Pt has h/x dementia and son reports that he is not as alert as he normally is.  ?20G LAC placed by hospice nurse. 1L LR given by hospice nurse.  ?140/92 ?HR 95 ?96% RA ?256 CBG ?

## 2021-09-28 NOTE — Discharge Instructions (Signed)
Frank [name] was seen in the ER today for his urinary tract infection.  He was given a dose of antibiotics in the emergency department.  Please continue with the ciprofloxacin that was prescribed to him outpatient by his outpatient provider.  Follow-up closely with them.  Increase his hydration at home and return to the ER with any new severe symptoms. ?

## 2021-09-30 ENCOUNTER — Other Ambulatory Visit: Payer: Self-pay | Admitting: Internal Medicine

## 2021-09-30 ENCOUNTER — Encounter: Payer: Self-pay | Admitting: Internal Medicine

## 2021-09-30 ENCOUNTER — Telehealth: Payer: Self-pay | Admitting: Internal Medicine

## 2021-09-30 LAB — CULTURE, URINE COMPREHENSIVE

## 2021-09-30 NOTE — Telephone Encounter (Signed)
Hospice nurse would like someone to call with the results of last weeks urinalysis. ?

## 2021-10-01 ENCOUNTER — Encounter: Payer: Self-pay | Admitting: Internal Medicine

## 2021-10-01 LAB — URINE CULTURE: Culture: >=100000 — AB

## 2021-10-02 ENCOUNTER — Telehealth: Payer: Self-pay | Admitting: Emergency Medicine

## 2021-10-02 NOTE — Telephone Encounter (Signed)
Post ED Visit - Positive Culture Follow-up ? ?Culture report reviewed by antimicrobial stewardship pharmacist: ?[place] Team ?'[]'$  Elenor Quinones, Pharm.D. ?'[]'$  Heide Guile, Pharm.D., BCPS AQ-ID ?'[]'$  Parks Neptune, Pharm.D., BCPS ?'[x]'$  Alycia Rossetti, Pharm.D., BCPS ?'[]'$  [place], Pharm.D., BCPS, AAHIVP ?'[]'$  Legrand Como, Pharm.D., BCPS, AAHIVP ?'[]'$  Salome Arnt, PharmD, BCPS ?'[]'$  Johnnette Gourd, PharmD, BCPS ?'[]'$  Hughes Better, PharmD, BCPS ?'[]'$  Leeroy Cha, PharmD ?'[]'$  Laqueta Linden, PharmD, BCPS ?'[]'$  Albertina Parr, PharmD ? ?[place] Team ?'[]'$  Leodis Sias, PharmD ?'[]'$  Lindell Spar, PharmD ?'[]'$  Royetta Asal, PharmD ?'[]'$  Graylin Shiver, Rph ?'[]'$  Rema Fendt) Glennon Mac, PharmD ?'[]'$  Arlyn Dunning, PharmD ?'[]'$  Netta Cedars, PharmD ?'[]'$  Dia Sitter, PharmD ?'[]'$  Leone Haven, PharmD ?'[]'$  Gretta Arab, PharmD ?'[]'$  Theodis Shove, PharmD ?'[]'$  Peggyann Juba, PharmD ?'[]'$  Reuel Boom, PharmD ? ? ?Positive urine culture ?Treated with cipro, organism sensitive to the same and no further patient follow-up is required at this time. ? ?Frank [name] ?10/02/2021, 10:14 AM ?  ?

## 2021-10-06 ENCOUNTER — Telehealth: Payer: Self-pay | Admitting: Internal Medicine

## 2021-10-06 NOTE — Telephone Encounter (Signed)
LVM for pt to rtn my call to schedule AWV with [facility]. Please schedule appt if pt calls the office.  

## 2021-10-08 ENCOUNTER — Ambulatory Visit: Payer: Medicare Other

## 2021-10-13 ENCOUNTER — Ambulatory Visit (INDEPENDENT_AMBULATORY_CARE_PROVIDER_SITE_OTHER)

## 2021-10-13 DIAGNOSIS — E538 Deficiency of other specified B group vitamins: Secondary | ICD-10-CM | POA: Diagnosis not present

## 2021-10-13 MED ORDER — CYANOCOBALAMIN 1000 MCG/ML IJ SOLN
1000.0000 ug | Freq: Once | INTRAMUSCULAR | Status: AC
  Administered 2021-10-13: 1000 ug via INTRAMUSCULAR

## 2021-10-13 NOTE — Progress Notes (Signed)
Pt here for monthly B12 injection per Dr. Ronnald Ramp ? ?B12 1033mg given IM, and pt tolerated injection well. ? ?Next B12 injection scheduled for 11/12/21 ?

## 2021-10-14 ENCOUNTER — Telehealth: Payer: Self-pay

## 2021-10-14 ENCOUNTER — Other Ambulatory Visit: Payer: Self-pay | Admitting: Internal Medicine

## 2021-10-14 DIAGNOSIS — E1142 Type 2 diabetes mellitus with diabetic polyneuropathy: Secondary | ICD-10-CM

## 2021-10-14 MED ORDER — TOUJEO MAX SOLOSTAR 300 UNIT/ML ~~LOC~~ SOPN: 10.0000 [IU] | PEN_INJECTOR | Freq: Every day | SUBCUTANEOUS | 1 refills | Status: DC

## 2021-10-14 NOTE — Telephone Encounter (Signed)
Pt wife is call on behalf of pt for a refill: ?insulin glargine, 2 Unit Dial, (TOUJEO MAX SOLOSTAR) 300 UNIT/ML Solostar Pen ? ?Pharmacy: ?[facility] ([place], [place] ? ?LOV 04/24/21 ? ?

## 2021-10-16 ENCOUNTER — Encounter: Payer: Self-pay | Admitting: Internal Medicine

## 2021-10-16 DIAGNOSIS — E1142 Type 2 diabetes mellitus with diabetic polyneuropathy: Secondary | ICD-10-CM

## 2021-10-20 ENCOUNTER — Encounter: Payer: Self-pay | Admitting: Internal Medicine

## 2021-10-20 ENCOUNTER — Ambulatory Visit (INDEPENDENT_AMBULATORY_CARE_PROVIDER_SITE_OTHER): Admitting: Internal Medicine

## 2021-10-20 VITALS — Ht 70.0 in | Wt 178.0 lb

## 2021-10-20 DIAGNOSIS — Z950 Presence of cardiac pacemaker: Secondary | ICD-10-CM

## 2021-10-20 DIAGNOSIS — I441 Atrioventricular block, second degree: Secondary | ICD-10-CM

## 2021-10-20 DIAGNOSIS — I10 Essential (primary) hypertension: Secondary | ICD-10-CM

## 2021-10-20 NOTE — Progress Notes (Signed)
? ? ? ? ?HPI ?Frank [name] returns today for followup. He is a pleasant 81 yo man with a h/o CHB, s/p PPM insertion. He has labile bp's and his meds have been adjusted. He has preserved LV function. He does have occaisions of weakness which are likely due to low bp. He has felt well.  ?Allergies  ?Allergen Reactions  ? Lisinopril Cough  ? Invokana [Canagliflozin] Other (See Comments)  ?  Stopped by MD, unknown reaction   ? Penicillins Other (See Comments)  ?  Allergic as a child.  Patient does not remember reaction.  Has had cephalasporins without problems.  ? Sulfamethoxazole Other (See Comments)  ?  Unknown reaction- from childhood  ? ? ? ?Current Outpatient Medications  ?Medication Sig Dispense Refill  ? aspirin 81 MG chewable tablet Chew 1 tablet (81 mg total) by mouth daily.    ? atorvastatin (LIPITOR) 10 MG tablet Take 1 tablet (10 mg total) by mouth daily. (Patient taking differently: Take 5 mg by mouth daily.)    ? BRILINTA 90 MG TABS tablet Take 1/2 (one-half) tablet by mouth twice daily 180 tablet 0  ? Ferric Maltol (ACCRUFER) 30 MG CAPS Take 1 capsule by mouth in the morning and at bedtime. 180 capsule 1  ? FLUoxetine (PROZAC) 20 MG capsule TAKE 1 CAPSULE BY MOUTH  DAILY 90 capsule 3  ? glipiZIDE (GLUCOTROL XL) 10 MG 24 hr tablet Take 1 tablet (10 mg total) by mouth daily. 90 tablet 0  ? insulin glargine, 2 Unit Dial, (TOUJEO MAX SOLOSTAR) 300 UNIT/ML Solostar Pen Inject 10 Units into the skin daily. 6 mL 1  ? Insulin Pen Needle 32G X 6 MM MISC 1 Act by Does not apply route daily. 100 each 1  ? lactose free nutrition (BOOST) LIQD Take 237 mLs by mouth 2 (two) times daily between meals.    ? levETIRAcetam (KEPPRA) 500 MG tablet Take 1 tablet by mouth twice daily 180 tablet 0  ? mineral oil-hydrophilic petrolatum (AQUAPHOR) ointment Apply 1 application topically as needed for dry skin.    ? PRESCRIPTION MEDICATION CPAP- At bedtime    ? solifenacin (VESICARE) 10 MG tablet TAKE 1 TABLET BY MOUTH  DAILY 90  tablet 3  ? tamsulosin (FLOMAX) 0.4 MG CAPS capsule Take 1 capsule (0.4 mg total) by mouth daily after supper. 30 capsule 0  ? ?No current facility-administered medications for this visit.  ? ? ? ?Past Medical History:  ?Diagnosis Date  ? Allergy   ? Rhinitis  ? Anemia   ? NOS iron deficient and B12 deficient  ? Arthritis   ? 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 Diabetes mellitus   ? Type 2  ? GERD (gastroesophageal reflux disease)   ? Hyperlipidemia   ? Hypertension   ? 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 OSA (obstructive sleep apnea)   ? cpap  ? Presence of permanent cardiac pacemaker   ? TBI (traumatic brain injury) ([place])   ? ? ?ROS: ? ? All systems reviewed and negative except as noted in the HPI. ? ? ?Past Surgical History:  ?Procedure Laterality Date  ? APPENDECTOMY    ? arm fracture Left 2011  ? with hardware  ? BURR HOLE Bilateral 08/04/2017  ? Procedure: Haskell Flirt;  Surgeon: [name], MD;  Location: [facility];  Service: Neurosurgery;  Laterality: Bilateral;  ? COLONOSCOPY    ? CRANIOTOMY Left 12/01/2017  ? Procedure: Trudee Kuster Holes CRANIOTOMY HEMATOMA EVACUATION SUBDURAL;  Surgeon: [name], MD;  Location: [facility];  Service: Neurosurgery;  Laterality: Left;  ? ESOPHAGOGASTRODUODENOSCOPY  2006  ? gastritis  ? IR ANGIO INTRA EXTRACRAN SEL COM CAROTID INNOMINATE UNI L MOD SED  03/31/2021  ? IR ANGIO VERTEBRAL SEL SUBCLAVIAN INNOMINATE UNI R MOD SED  03/31/2021  ? IR CT HEAD LTD  03/31/2021  ? IR INTRAVSC STENT CERV CAROTID W/EMB-PROT MOD SED INCL ANGIO  03/31/2021  ? IR PERCUTANEOUS ART THROMBECTOMY/INFUSION INTRACRANIAL INC DIAG ANGIO  09/09/2020  ? IR RADIOLOGIST EVAL & MGMT  03/10/2021  ? IR RADIOLOGIST EVAL & MGMT  04/28/2021  ? PACEMAKER IMPLANT N/A 05/14/2020  ? Procedure: PACEMAKER IMPLANT;  Surgeon: [name], MD;  Location: [facility];  Service: Cardiovascular;  Laterality: N/A;  ? RADIOLOGY WITH ANESTHESIA N/A 09/09/2020  ? Procedure: IR WITH ANESTHESIA;  Surgeon: Radiologist, Medication, MD;   Location: [facility];  Service: Radiology;  Laterality: N/A;  ? RADIOLOGY WITH ANESTHESIA N/A 03/31/2021  ? Procedure: IR WITH ANESTHESIA STENT PLACEMENT;  Surgeon: Luanne Bras, MD;  Location: [facility];  Service: Radiology;  Laterality: N/A;  ? ROTATOR CUFF REPAIR    ? SEPTOPLASTY  1959  ? Deviated Septum  ? 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 ? ? ?Family History  ?Problem Relation Age of Onset  ? Heart disease Mother   ? Breast cancer Mother   ? Stroke Father   ? Allergies Father   ?     Father and children  ? Coronary artery disease Brother   ? Diabetes Neg Hx   ? Colon cancer Neg Hx   ? ? ? ?Social History  ? ?Socioeconomic History  ? Marital status: Married  ?  Spouse name: Not on file  ? Number of children: 1  ? Years of education: Not on file  ? Highest education level: Not on file  ?Occupational History  ? Not on file  ?Tobacco Use  ? Smoking status: Former  ?  Packs/day: 1.00  ?  Types: Cigarettes  ?  Quit date: 07/14/1983  ?  Years since quitting: 38.2  ? Smokeless tobacco: Never  ?Vaping Use  ? Vaping Use: Never used  ?Substance and Sexual Activity  ? Alcohol use: Yes  ?  Alcohol/week: 2.0 standard drinks  ?  Types: 2 Standard drinks or equivalent per week  ?  Comment: occassionally  ? Drug use: No  ? Sexual activity: Never  ?Other Topics Concern  ? Not on file  ?Social History Narrative  ? Right handed   ? Lives with wife  ? Has a caregiver   ? ?Social Determinants of Health  ? ?Financial Resource Strain: Not on file  ?Food Insecurity: Not on file  ?Transportation Needs: Not on file  ?Physical Activity: Not on file  ?Stress: Not on file  ?Social Connections: Not on file  ?Intimate Partner Violence: Not on file  ? ? ? ?Ht '5\' 10"'$  (1.778 m)   Wt 178 lb (80.7 kg)   BMI 25.54 kg/m?  ? ?Physical Exam: ? ?Well appearing NAD ?HEENT: Unremarkable ?Neck:  No JVD, no thyromegally ?Lymphatics:  No adenopathy ?Back:  No CVA tenderness ?Lungs:  Clear with no wheezes ?HEART:  Regular rate rhythm, no murmurs, no rubs,  no clicks ?Abd:  soft, positive bowel sounds, no organomegally, no rebound, no guarding ?Ext:  2 plus pulses, no edema, no cyanosis, no clubbing ?Skin:  No rashes no nodules ?Neuro:  CN II through XII intact, motor  grossly intact ? ? ?DEVICE  ?Normal device function.  See PaceArt for details.  ? ?Assess/Plan:  ?1. CHB - he is asymptomatic, s/p PPM insertion. He has no escape today. ?2. 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 ?3. HTN - he has some bp lability. He is encouraged to check his pressures. On days when his pressure is a little high, eat less salt and on days when it is low, eat more salt. No change in his current meds. ?4. Dyslipidemia - he will continue the lipitor. He has lost weight. ?  ?Carleene Overlie Audiel Scheiber,MD ?

## 2021-10-20 NOTE — Patient Instructions (Addendum)
Medication Instructions:  ?Your physician recommends that you continue on your current medications as directed. Please refer to the Current Medication list given to you today. ? ?Labwork: ?None ordered. ? ?Testing/Procedures: ?None ordered. ? ?Follow-Up: ?Your physician wants you to follow-up in: one year with Cristopher Peru, MD or one of the following Advanced Practice Providers on your designated Care Team:   ?Tommye Standard, PA-C ?Legrand Como "Jonni Sanger" [place], PA-C ?You will receive a reminder letter in the mail two months in advance. If you don't receive a letter, please call our office to schedule the follow-up appointment. ? ?Remote monitoring is used to monitor your Pacemaker from home. This monitoring reduces the number of office visits required to check your device to one time per year. It allows [place] to keep an eye on the functioning of your device to ensure it is working properly. You are scheduled for a device check from home on 11/12/2021. You may send your transmission at any time that day. If you have a wireless device, the transmission will be sent automatically. After your physician reviews your transmission, you will receive a postcard with your next transmission date. ? ?Any Other Special Instructions Will Be Listed Below (If Applicable). ? ?If you need a refill on your cardiac medications before your next appointment, please call your pharmacy.  ? ?Important Information About Sugar ? ? ? ? ? ? ? ?

## 2021-10-22 ENCOUNTER — Ambulatory Visit (INDEPENDENT_AMBULATORY_CARE_PROVIDER_SITE_OTHER)

## 2021-10-22 ENCOUNTER — Ambulatory Visit (INDEPENDENT_AMBULATORY_CARE_PROVIDER_SITE_OTHER): Admitting: Internal Medicine

## 2021-10-22 ENCOUNTER — Encounter: Payer: Self-pay | Admitting: Internal Medicine

## 2021-10-22 ENCOUNTER — Other Ambulatory Visit: Payer: Self-pay | Admitting: Internal Medicine

## 2021-10-22 VITALS — BP 130/60 | HR 61 | Resp 18 | Ht 70.0 in

## 2021-10-22 DIAGNOSIS — M25562 Pain in left knee: Secondary | ICD-10-CM | POA: Diagnosis not present

## 2021-10-22 DIAGNOSIS — I10 Essential (primary) hypertension: Secondary | ICD-10-CM | POA: Diagnosis not present

## 2021-10-22 DIAGNOSIS — W19XXXA Unspecified fall, initial encounter: Secondary | ICD-10-CM | POA: Insufficient documentation

## 2021-10-22 DIAGNOSIS — E1142 Type 2 diabetes mellitus with diabetic polyneuropathy: Secondary | ICD-10-CM | POA: Diagnosis not present

## 2021-10-22 DIAGNOSIS — M1612 Unilateral primary osteoarthritis, left hip: Secondary | ICD-10-CM | POA: Diagnosis not present

## 2021-10-22 DIAGNOSIS — M1712 Unilateral primary osteoarthritis, left knee: Secondary | ICD-10-CM | POA: Diagnosis not present

## 2021-10-22 DIAGNOSIS — M160 Bilateral primary osteoarthritis of hip: Secondary | ICD-10-CM | POA: Diagnosis not present

## 2021-10-22 DIAGNOSIS — N390 Urinary tract infection, site not specified: Secondary | ICD-10-CM

## 2021-10-22 NOTE — Progress Notes (Signed)
Patient ID: Frank [name], male   DOB: 1941/04/28, 81 y.o.   MRN: [id number] ? ? ? ?    Chief Complaint: follow up left upper leg pain, recent fall, htn, dm ? ?     HPI:  Frank [name] is a 81 y.o. male here with sister and home caregiver with hx limited by dementia; c/o at least 1 days hx of left upper leg pain from knee to hip, mod, intermittent, achy, worse to palpate the anterior and post upper leg but hard to be specific about location of worst pain; not clear what makes better or worse but was able to do some hip flexion and extension excercises this am with first waking up; did have a slip off the commode 3 days ago but not clear if releated as pain did not seem to start then.  Has also generalized weakness worsening overall per family but does have PT working 2-3 days per wk. Pt denies chest pain, increased sob or doe, wheezing, orthopnea, PND, increased LE swelling, palpitations, dizziness or syncope.   Pt denies polydipsia, polyuria, or new focal neuro s/s.  Pt denies fever, wt loss, night sweats, loss of appetite, or other constitutional symptoms  Urine is dark in bag today but not overly cloudy.  ?      ?Wt Readings from Last 3 Encounters:  ?10/20/21 178 lb (80.7 kg)  ?09/28/21 175 lb (79.4 kg)  ?05/20/21 176 lb 9.4 oz (80.1 kg)  ? ?BP Readings from Last 3 Encounters:  ?10/22/21 130/60  ?09/28/21 132/72  ?05/21/21 121/60  ? ?      ?Past Medical History:  ?Diagnosis Date  ? Allergy   ? Rhinitis  ? Anemia   ? NOS iron deficient and B12 deficient  ? Arthritis   ? 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 Diabetes mellitus   ? Type 2  ? GERD (gastroesophageal reflux disease)   ? Hyperlipidemia   ? Hypertension   ? 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 OSA (obstructive sleep apnea)   ? cpap  ? Presence of permanent cardiac pacemaker   ? TBI (traumatic brain injury) ([place])   ? ?Past Surgical History:  ?Procedure Laterality Date  ? APPENDECTOMY    ? arm fracture Left 2011  ? with hardware  ? BURR HOLE  Bilateral 08/04/2017  ? Procedure: Haskell Flirt;  Surgeon: [name], MD;  Location: [facility];  Service: Neurosurgery;  Laterality: Bilateral;  ? COLONOSCOPY    ? CRANIOTOMY Left 12/01/2017  ? Procedure: Trudee Kuster Holes CRANIOTOMY HEMATOMA EVACUATION SUBDURAL;  Surgeon: [name], MD;  Location: [facility];  Service: Neurosurgery;  Laterality: Left;  ? ESOPHAGOGASTRODUODENOSCOPY  2006  ? gastritis  ? IR ANGIO INTRA EXTRACRAN SEL COM CAROTID INNOMINATE UNI L MOD SED  03/31/2021  ? IR ANGIO VERTEBRAL SEL SUBCLAVIAN INNOMINATE UNI R MOD SED  03/31/2021  ? IR CT HEAD LTD  03/31/2021  ? IR INTRAVSC STENT CERV CAROTID W/EMB-PROT MOD SED INCL ANGIO  03/31/2021  ? IR PERCUTANEOUS ART THROMBECTOMY/INFUSION INTRACRANIAL INC DIAG ANGIO  09/09/2020  ? IR RADIOLOGIST EVAL & MGMT  03/10/2021  ? IR RADIOLOGIST EVAL & MGMT  04/28/2021  ? PACEMAKER IMPLANT N/A 05/14/2020  ? Procedure: PACEMAKER IMPLANT;  Surgeon: [name], MD;  Location: [facility];  Service: Cardiovascular;  Laterality: N/A;  ? RADIOLOGY WITH ANESTHESIA N/A 09/09/2020  ? Procedure: IR WITH ANESTHESIA;  Surgeon: Radiologist, Medication, MD;  Location: [facility];  Service:  Radiology;  Laterality: N/A;  ? RADIOLOGY WITH ANESTHESIA N/A 03/31/2021  ? Procedure: IR WITH ANESTHESIA STENT PLACEMENT;  Surgeon: Luanne Bras, MD;  Location: [facility];  Service: Radiology;  Laterality: N/A;  ? ROTATOR CUFF REPAIR    ? SEPTOPLASTY  1959  ? Deviated Septum  ? 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 ? reports that he quit smoking about 38 years ago. His smoking use included cigarettes. He smoked an average of 1 pack per day. He has never used smokeless tobacco. He reports current alcohol use of about 2.0 standard drinks per week. He reports that he does not use drugs. ?family history includes Allergies in his father; Breast cancer in his mother; Coronary artery disease in his brother; Heart disease in his mother; Stroke in his father. ?Allergies  ?Allergen Reactions  ? Lisinopril Cough  ?  Invokana [Canagliflozin] Other (See Comments)  ?  Stopped by MD, unknown reaction   ? Penicillins Other (See Comments)  ?  Allergic as a child.  Patient does not remember reaction.  Has had cephalasporins without problems.  ? Sulfamethoxazole Other (See Comments)  ?  Unknown reaction- from childhood  ? ?Current Outpatient Medications on File Prior to Visit  ?Medication Sig Dispense Refill  ? aspirin 81 MG chewable tablet Chew 1 tablet (81 mg total) by mouth daily.    ? atorvastatin (LIPITOR) 10 MG tablet Take 1 tablet (10 mg total) by mouth daily. (Patient taking differently: Take 5 mg by mouth daily.)    ? BRILINTA 90 MG TABS tablet Take 1/2 (one-half) tablet by mouth twice daily 180 tablet 0  ? Ferric Maltol (ACCRUFER) 30 MG CAPS Take 1 capsule by mouth in the morning and at bedtime. 180 capsule 1  ? FLUoxetine (PROZAC) 20 MG capsule TAKE 1 CAPSULE BY MOUTH  DAILY 90 capsule 3  ? glipiZIDE (GLUCOTROL XL) 10 MG 24 hr tablet Take 1 tablet (10 mg total) by mouth daily. 90 tablet 0  ? insulin glargine, 2 Unit Dial, (TOUJEO MAX SOLOSTAR) 300 UNIT/ML Solostar Pen Inject 10 Units into the skin daily. 6 mL 1  ? Insulin Pen Needle 32G X 6 MM MISC 1 Act by Does not apply route daily. 100 each 1  ? lactose free nutrition (BOOST) LIQD Take 237 mLs by mouth 2 (two) times daily between meals.    ? levETIRAcetam (KEPPRA) 500 MG tablet Take 1 tablet by mouth twice daily 180 tablet 0  ? mineral oil-hydrophilic petrolatum (AQUAPHOR) ointment Apply 1 application topically as needed for dry skin.    ? PRESCRIPTION MEDICATION CPAP- At bedtime    ? solifenacin (VESICARE) 10 MG tablet TAKE 1 TABLET BY MOUTH  DAILY 90 tablet 3  ? tamsulosin (FLOMAX) 0.4 MG CAPS capsule Take 1 capsule (0.4 mg total) by mouth daily after supper. 30 capsule 0  ? ?No current facility-administered medications on file prior to visit.  ? ?     ROS:  All others reviewed and negative. ? ?Objective  ? ?     PE:  BP 130/60   Pulse 61   Resp 18   Ht '5\' 10"'$  (1.778  m)   SpO2 95%   BMI 25.54 kg/m?  ? ?              Constitutional: Pt appears in NAD ?              HENT: Head: NCAT.  ?  Right Ear: External ear normal.   ?              Left Ear: External ear normal.  ?              Eyes: . Pupils are equal, round, and reactive to light. Conjunctivae and EOM are normal ?              Nose: without d/c or deformity ?              Neck: Neck supple. Gross normal ROM ?              Cardiovascular: Normal rate and regular rhythm.   ?              Pulmonary/Chest: Effort normal and breath sounds without rales or wheezing.  ?              Abd:  Soft, NT, ND, + BS, no organomegaly ?              Neurological: Pt is alert. At baseline orientation, motor grossly intact ?              Mild tender diffuse quad and post upper leg without redness, swelling ?              Skin: Skin is warm. No rashes, no other new lesions, LE edema - none ?              Psychiatric: Pt behavior is normal without agitation  ? ?Micro: none ? ?Cardiac tracings I have personally interpreted today:  none ? ?Pertinent Radiological findings (summarize): none  ? ?Lab Results  ?Component Value Date  ? WBC 7.4 09/28/2021  ? HGB 12.0 (L) 09/28/2021  ? HCT 37.4 (L) 09/28/2021  ? PLT 160 09/28/2021  ? GLUCOSE 182 (H) 09/28/2021  ? CHOL 104 11/19/2020  ? TRIG 91 11/19/2020  ? HDL 33 (L) 11/19/2020  ? [place] 53 11/19/2020  ? ALT 16 09/28/2021  ? AST 15 09/28/2021  ? NA 136 09/28/2021  ? K 3.9 09/28/2021  ? CL 103 09/28/2021  ? CREATININE 1.30 (H) 09/28/2021  ? BUN 42 (H) 09/28/2021  ? CO2 26 09/28/2021  ? TSH 0.459 11/18/2020  ? PSA 1.08 07/10/2015  ? INR 0.9 03/30/2021  ? HGBA1C 7.1 (H) 05/19/2021  ? MICROALBUR 67.2 04/23/2020  ? ?Assessment/Plan:  ?Frank [name] is a 81 y.o. White or Caucasian [1] male with  has a past medical history of Allergy, Anemia, Arthritis, AV block, Mobitz 2 (05/14/2020), Diabetes mellitus, GERD (gastroesophageal reflux disease), Hyperlipidemia, Hypertension, Neuropathy  (2001), OSA (obstructive sleep apnea), Presence of permanent cardiac pacemaker, and TBI (traumatic brain injury) ([place]). ? ?Fall ?Off commode several days ago, now with non specific left upper leg pain, for xrays

## 2021-10-22 NOTE — Assessment & Plan Note (Signed)
Off commode several days ago, now with non specific left upper leg pain, for xrays today, tylenol prn ?

## 2021-10-22 NOTE — Assessment & Plan Note (Addendum)
BP Readings from Last 3 Encounters:  ?10/22/21 130/60  ?09/28/21 132/72  ?05/21/21 121/60  ? ?Stable, pt to continue medical treatment  - diet ? ?

## 2021-10-22 NOTE — Assessment & Plan Note (Signed)
Lab Results  ?Component Value Date  ? HGBA1C 7.1 (H) 05/19/2021  ? ?Stable, pt to continue current medical treatment glucotrol, toujeo ? ?

## 2021-10-22 NOTE — Patient Instructions (Signed)
Please continue all other medications as before, and refills have been done if requested.  Please have the pharmacy call with any other refills you may need.  Please continue your efforts at being more active, low cholesterol diet, and weight control.  Please keep your appointments with your specialists as you may have planned  Please go to the XRAY Department in the first floor for the x-ray testing  Please go to the LAB at the blood drawing area for the tests to be done  You will be contacted by phone if any changes need to be made immediately.  Otherwise, you will receive a letter about your results with an explanation, but please check with MyChart first.  Please remember to sign up for MyChart if you have not done so, as this will be important to you in the future with finding out test results, communicating by private email, and scheduling acute appointments online when needed.  

## 2021-10-22 NOTE — Assessment & Plan Note (Signed)
Recent uti - now for urine culture to reassess ?

## 2021-10-23 ENCOUNTER — Other Ambulatory Visit: Payer: Self-pay | Admitting: Internal Medicine

## 2021-10-23 DIAGNOSIS — M79652 Pain in left thigh: Secondary | ICD-10-CM

## 2021-10-23 LAB — URINALYSIS, ROUTINE W REFLEX MICROSCOPIC
Bilirubin Urine: NEGATIVE
Ketones, ur: NEGATIVE
Nitrite: POSITIVE — AB
Specific Gravity, Urine: >=1.03 — AB (ref 1.000–1.030)
Total Protein, Urine: 100 — AB
Urine Glucose: NEGATIVE
Urobilinogen, UA: 0.2 (ref 0.0–1.0)
pH: 5.5 (ref 5.0–8.0)

## 2021-10-24 ENCOUNTER — Other Ambulatory Visit: Payer: Self-pay | Admitting: Internal Medicine

## 2021-10-24 LAB — URINE CULTURE

## 2021-10-24 MED ORDER — LEVOFLOXACIN 500 MG PO TABS: 500.0000 mg | ORAL_TABLET | Freq: Every day | ORAL | 0 refills | Status: AC

## 2021-10-29 ENCOUNTER — Telehealth: Payer: Self-pay | Admitting: Internal Medicine

## 2021-10-29 MED ORDER — TOUJEO MAX SOLOSTAR 300 UNIT/ML ~~LOC~~ SOPN: 10.0000 [IU] | PEN_INJECTOR | Freq: Every day | SUBCUTANEOUS | 1 refills | Status: DC

## 2021-10-29 NOTE — Telephone Encounter (Signed)
A representative of Hospice calls today in regards to PT's discharge! PT's health has increased and we are now looking at an expectancy past 6 months. They would like a referral be put in for Palliative HH as well as an eventual referral for home health to assist with a suprapubic catheter. ? ?CB: [phone] ?

## 2021-10-30 ENCOUNTER — Emergency Department (HOSPITAL_BASED_OUTPATIENT_CLINIC_OR_DEPARTMENT_OTHER)
Admission: EM | Admit: 2021-10-30 | Discharge: 2021-10-30 | Disposition: A | Discharge status: Home / Self Care | Attending: Emergency Medicine | Admitting: Emergency Medicine

## 2021-10-30 ENCOUNTER — Encounter (HOSPITAL_BASED_OUTPATIENT_CLINIC_OR_DEPARTMENT_OTHER): Payer: Self-pay | Admitting: Emergency Medicine

## 2021-10-30 ENCOUNTER — Other Ambulatory Visit: Payer: Self-pay | Admitting: Internal Medicine

## 2021-10-30 ENCOUNTER — Other Ambulatory Visit: Payer: Self-pay

## 2021-10-30 DIAGNOSIS — Z794 Long term (current) use of insulin: Secondary | ICD-10-CM | POA: Insufficient documentation

## 2021-10-30 DIAGNOSIS — I10 Essential (primary) hypertension: Secondary | ICD-10-CM | POA: Diagnosis not present

## 2021-10-30 DIAGNOSIS — Z7982 Long term (current) use of aspirin: Secondary | ICD-10-CM | POA: Insufficient documentation

## 2021-10-30 DIAGNOSIS — R6889 Other general symptoms and signs: Secondary | ICD-10-CM | POA: Diagnosis not present

## 2021-10-30 DIAGNOSIS — R531 Weakness: Secondary | ICD-10-CM | POA: Insufficient documentation

## 2021-10-30 DIAGNOSIS — F0390 Unspecified dementia without behavioral disturbance: Secondary | ICD-10-CM | POA: Insufficient documentation

## 2021-10-30 DIAGNOSIS — Z743 Need for continuous supervision: Secondary | ICD-10-CM | POA: Diagnosis not present

## 2021-10-30 DIAGNOSIS — E119 Type 2 diabetes mellitus without complications: Secondary | ICD-10-CM | POA: Diagnosis not present

## 2021-10-30 LAB — CBC WITH DIFFERENTIAL/PLATELET
Abs Immature Granulocytes: 0.02 10*3/uL (ref 0.00–0.07)
Basophils Absolute: 0 10*3/uL (ref 0.0–0.1)
Basophils Relative: 1 %
Eosinophils Absolute: 0.4 10*3/uL (ref 0.0–0.5)
Eosinophils Relative: 6 %
HCT: 37.4 % — ABNORMAL LOW (ref 39.0–52.0)
Hemoglobin: 11.7 g/dL — ABNORMAL LOW (ref 13.0–17.0)
Immature Granulocytes: 0 %
Lymphocytes Relative: 23 %
Lymphs Abs: 1.5 10*3/uL (ref 0.7–4.0)
MCH: 26.8 pg (ref 26.0–34.0)
MCHC: 31.3 g/dL (ref 30.0–36.0)
MCV: 85.6 fL (ref 80.0–100.0)
Monocytes Absolute: 0.7 10*3/uL (ref 0.1–1.0)
Monocytes Relative: 11 %
Neutro Abs: 3.8 10*3/uL (ref 1.7–7.7)
Neutrophils Relative %: 59 %
Platelets: 134 10*3/uL — ABNORMAL LOW (ref 150–400)
RBC: 4.37 MIL/uL (ref 4.22–5.81)
RDW: 14.2 % (ref 11.5–15.5)
WBC: 6.4 10*3/uL (ref 4.0–10.5)
nRBC: 0 % (ref 0.0–0.2)

## 2021-10-30 LAB — COMPREHENSIVE METABOLIC PANEL
ALT: 8 U/L (ref 0–44)
AST: 11 U/L — ABNORMAL LOW (ref 15–41)
Albumin: 3.7 g/dL (ref 3.5–5.0)
Alkaline Phosphatase: 45 U/L (ref 38–126)
Anion gap: 7 (ref 5–15)
BUN: 33 mg/dL — ABNORMAL HIGH (ref 8–23)
CO2: 29 mmol/L (ref 22–32)
Calcium: 9.2 mg/dL (ref 8.9–10.3)
Chloride: 102 mmol/L (ref 98–111)
Creatinine, Ser: 1.52 mg/dL — ABNORMAL HIGH (ref 0.61–1.24)
GFR, Estimated: 45 mL/min — ABNORMAL LOW (ref >60)
Glucose, Bld: 98 mg/dL (ref 70–99)
Potassium: 3.9 mmol/L (ref 3.5–5.1)
Sodium: 138 mmol/L (ref 135–145)
Total Bilirubin: 0.4 mg/dL (ref 0.3–1.2)
Total Protein: 6 g/dL — ABNORMAL LOW (ref 6.5–8.1)

## 2021-10-30 MED ORDER — LACTATED RINGERS IV BOLUS
1000.0000 mL | Freq: Once | INTRAVENOUS | Status: AC
Start: 2021-10-30 — End: 2021-10-30
  Administered 2021-10-30: 1000 mL via INTRAVENOUS

## 2021-10-30 NOTE — ED Triage Notes (Signed)
Per [facility] pt coming from home- family is concerned for UTI. States patient has had cloudy urine, lethargy and weakness over the past couple days. Pt has indwelling catheter.  ?

## 2021-10-30 NOTE — Discharge Instructions (Addendum)
Today his labs look normal ex lap a slight increase in his kidney function which may be related to some mild dehydration.  Some of his symptoms may be related to the antibiotic.  At this time it appears that the antibiotic would cover the infection that he has.  Hopefully changing the catheter will also decrease the infection.  A new urine culture was sent.  Plan on following up with your doctor next week as planned.  However in the meantime if he starts to develop high fevers, severe abdominal pain, nausea or vomiting or other concerns return to the emergency room. ?

## 2021-10-30 NOTE — ED Provider Notes (Signed)
?[facility]PT ?Provider Note ? ? ?CSN: [id number] ?Arrival date & time: 10/30/21  1151 ? ?  ? ?History ? ?Chief Complaint  ?Patient presents with  ? Weakness  ? ? ?Frank [name] is a 81 y.o. male. ? ?Patient is an 81 year old male with a history of diabetes, hypertension, anemia, heart block, wheelchair-bound with suprapubic catheter who is currently under palliative care with a history of some dementia who is presenting today with his family and caregiver for worsening fatigue, weakness and lethargy that is worsening over the last few days.  His wife gives a history and reports that 3 weeks ago he was diagnosed with urinary tract infection and treated with antibiotics.  He seemed to be doing much better but then last week they started noticing his urine was cloudy again they followed up with their PCP.  He did a urine and urine culture that grew back Staph epidermidis.  He was started on Levaquin 2 days ago but his wife reports that he has taken the medication but he is just seems to be doing the same thing he was the last time he went to the emergency room.  His catheter has been draining and he denies any pain.  He has not had fever or vomiting.  She reports he still has somewhat of an appetite and has continued to take his medication and eat.  He was able to get out of bed into his wheelchair but is just not himself.  They concern for worsening infection and came here for further evaluation. ? ?The history is provided by a relative, the patient and medical records.  ?Weakness ? ?  ? ?Home Medications ?Prior to Admission medications   ?Medication Sig Start Date End Date Taking? Authorizing Provider  ?aspirin 81 MG chewable tablet Chew 1 tablet (81 mg total) by mouth daily. 09/18/20   Briant Cedar, MD  ?atorvastatin (LIPITOR) 10 MG tablet Take 1 tablet (10 mg total) by mouth daily. ?Patient taking differently: Take 5 mg by mouth daily. 09/18/20   Briant Cedar, MD   ?BRILINTA 90 MG TABS tablet Take 1/2 (one-half) tablet by mouth twice daily 07/20/21   Janith Lima, MD  ?Ferric Maltol (ACCRUFER) 30 MG CAPS Take 1 capsule by mouth in the morning and at bedtime. 07/08/21   Janith Lima, MD  ?FLUoxetine (PROZAC) 20 MG capsule TAKE 1 CAPSULE BY MOUTH  DAILY 08/29/21   Janith Lima, MD  ?glipiZIDE (GLUCOTROL XL) 10 MG 24 hr tablet Take 1 tablet (10 mg total) by mouth daily. 09/10/21   Janith Lima, MD  ?insulin glargine, 2 Unit Dial, (TOUJEO MAX SOLOSTAR) 300 UNIT/ML Solostar Pen Inject 10 Units into the skin daily. 10/29/21   Janith Lima, MD  ?Insulin Pen Needle 32G X 6 MM MISC 1 Act by Does not apply route daily. 04/08/20   Janith Lima, MD  ?lactose free nutrition (BOOST) LIQD Take 237 mLs by mouth 2 (two) times daily between meals.    [provider]  ?levETIRAcetam (KEPPRA) 500 MG tablet Take 1 tablet by mouth twice daily 08/24/21   Janith Lima, MD  ?levofloxacin (LEVAQUIN) 500 MG tablet Take 1 tablet (500 mg total) by mouth daily for 10 days. 10/24/21 11/03/21  Biagio Borg, MD  ?mineral oil-hydrophilic petrolatum (AQUAPHOR) ointment Apply 1 application topically as needed for dry skin.    [provider]  ?PRESCRIPTION MEDICATION CPAP- At bedtime    [provider]  ?  solifenacin (VESICARE) 10 MG tablet TAKE 1 TABLET BY MOUTH  DAILY 08/29/21   Janith Lima, MD  ?tamsulosin (FLOMAX) 0.4 MG CAPS capsule Take 1 capsule (0.4 mg total) by mouth daily after supper. 04/03/21   Swayze, Ava, DO  ?   ? ?Allergies    ?Lisinopril, Invokana [canagliflozin], Penicillins, and Sulfamethoxazole   ? ?Review of Systems   ?Review of Systems  ?Neurological:  Positive for weakness.  ? ?Physical Exam ?Updated Vital Signs ?BP (!) 151/73   Pulse 62   Temp 97.8 ?F (36.6 ?C) (Oral)   Resp 16   Ht '5\' 10"'$  (1.778 m)   Wt 80.7 kg   SpO2 93%   BMI 25.54 kg/m?  ?Physical Exam ?Vitals and nursing note reviewed.  ?Constitutional:   ?   General: He is not in acute  distress. ?   Appearance: He is well-developed.  ?   Comments: Able to answer questions and participate with exam  ?HENT:  ?   Head: Normocephalic and atraumatic.  ?   Mouth/Throat:  ?   Mouth: Mucous membranes are moist.  ?Eyes:  ?   Conjunctiva/sclera: Conjunctivae normal.  ?   Pupils: Pupils are equal, round, and reactive to light.  ?Cardiovascular:  ?   Rate and Rhythm: Normal rate and regular rhythm.  ?   Heart sounds: No murmur heard. ?Pulmonary:  ?   Effort: Pulmonary effort is normal. No respiratory distress.  ?   Breath sounds: Normal breath sounds. No wheezing or rales.  ?Abdominal:  ?   General: There is no distension.  ?   Palpations: Abdomen is soft.  ?   Tenderness: There is no abdominal tenderness. There is no guarding or rebound.  ?   Comments: Suprapubic catheter in place without drainage around the tube  ?Musculoskeletal:     ?   General: No tenderness. Normal range of motion.  ?   Cervical back: Normal range of motion and neck supple.  ?   Right lower leg: No edema.  ?   Left lower leg: No edema.  ?Skin: ?   General: Skin is warm and dry.  ?   Findings: No erythema or rash.  ?Neurological:  ?   Mental Status: He is alert and oriented to person, place, and time.  ?Psychiatric:     ?   Behavior: Behavior normal.  ? ? ?ED Results / Procedures / Treatments   ?Labs ?(all labs ordered are listed, but only abnormal results are displayed) ?Labs Reviewed  ?CBC WITH DIFFERENTIAL/PLATELET - Abnormal; Notable for the following components:  ?    Result Value  ? Hemoglobin 11.7 (*)   ? HCT 37.4 (*)   ? Platelets 134 (*)   ? All other components within normal limits  ?COMPREHENSIVE METABOLIC PANEL - Abnormal; Notable for the following components:  ? BUN 33 (*)   ? Creatinine, Ser 1.52 (*)   ? Total Protein 6.0 (*)   ? AST 11 (*)   ? GFR, Estimated 45 (*)   ? All other components within normal limits  ?URINE CULTURE  ? ? ?EKG ?EKG Interpretation ? ?Date/Time:  Thursday October 30 2021 12:00:22 EDT ?Ventricular  Rate:  66 ?PR Interval:  259 ?QRS Duration: 125 ?QT Interval:  449 ?QTC Calculation: 471 ?R Axis:   -43 ?Text Interpretation: Sinus rhythm Prolonged PR interval IVCD, consider atypical RBBB Inferior infarct, old Consider anterior infarct No significant change since last tracing Confirmed by Blanchie Dessert [phone]) on 10/30/2021 1:06:08  PM ? ?Radiology ?No results found. ? ?Procedures ?Procedures  ? ? ?Medications Ordered in ED ?Medications  ?lactated ringers bolus 1,000 mL (1,000 mLs Intravenous New Bag/Given 10/30/21 1233)  ? ? ?ED Course/ Medical Decision Making/ A&P ?  ?                        ?Medical Decision Making ?Amount and/or Complexity of Data Reviewed ?Independent Historian: caregiver and spouse ?External Data Reviewed: notes. ?Labs: ordered. Decision-making details documented in ED Course. ? ? ?Patient is an elderly male with multiple medical problems who is presenting today due to concern for possible worsening UTI.  Patient does have a suprapubic catheter and started Levaquin 2 days ago based on cultures that grew back from PCPs office that showed Staph epidermidis that was sensitive to Levaquin but resistant to Bactrim and Macrobid.  He has had 2 doses now but his wife feels that he is going in the wrong direction.  He has not had any cough or shortness of breath.  On exam he has no abdominal tenderness and his vital signs are reassuring.  He has no evidence of fluid overload today.  Concern for possible worsening infection versus reaction to the antibiotic as she reports he has not been on this before.  He has still been eating but appetite is decreased.  We will give patient IV fluids.  We will recheck labs to ensure no acute changes.  Such as uremia, new AKI.  Low suspicion for elevated ammonia.  Also will change patient's suprapubic catheter. ? ?1:43 PM ?Superacute pubic catheter was changed without any difficulty.  A new urine culture will be sent.  Patient's last urine culture did grow out  Staph epidermidis but prior to that was Klebsiella which was pansensitive.  External medical records from his recent doctors visits were reviewed.  I independently interpreted patient's labs today and white count is w

## 2021-10-31 LAB — URINE CULTURE: Culture: <10000 — AB

## 2021-10-31 NOTE — Telephone Encounter (Signed)
I have given Ria Comment the verbal order to move forward with Palliative care. ? ?However, pt would need PCP to enter referral for home health to assist with catheter.  ?

## 2021-10-31 NOTE — Telephone Encounter (Signed)
Please call Ria Comment to follow up in regards to pt.  ? ?CB: [phone] ?

## 2021-11-03 ENCOUNTER — Other Ambulatory Visit: Payer: Self-pay | Admitting: Internal Medicine

## 2021-11-03 ENCOUNTER — Telehealth: Payer: Self-pay

## 2021-11-03 NOTE — Telephone Encounter (Signed)
Attempted to contact patient's spouse [name] to schedule a Palliative Care consult appointment. No answer left a message to return call.  ?

## 2021-11-04 ENCOUNTER — Telehealth: Payer: Self-pay

## 2021-11-04 NOTE — Telephone Encounter (Signed)
Spoke with patient's spouse Frank [name] and scheduled a Mychart Palliative Consult for 11/10/21 @ 2:30 PM.  ? ?Consent obtained; updated Netsmart, Team List and Epic.  ? ?

## 2021-11-05 ENCOUNTER — Encounter: Payer: Self-pay | Admitting: Internal Medicine

## 2021-11-05 DIAGNOSIS — N390 Urinary tract infection, site not specified: Secondary | ICD-10-CM

## 2021-11-05 DIAGNOSIS — R339 Retention of urine, unspecified: Secondary | ICD-10-CM

## 2021-11-06 ENCOUNTER — Ambulatory Visit (INDEPENDENT_AMBULATORY_CARE_PROVIDER_SITE_OTHER): Payer: Medicare Other | Admitting: Internal Medicine

## 2021-11-06 ENCOUNTER — Encounter: Payer: Self-pay | Admitting: Internal Medicine

## 2021-11-06 DIAGNOSIS — M79605 Pain in left leg: Secondary | ICD-10-CM | POA: Diagnosis not present

## 2021-11-06 DIAGNOSIS — N390 Urinary tract infection, site not specified: Secondary | ICD-10-CM | POA: Diagnosis not present

## 2021-11-06 DIAGNOSIS — I10 Essential (primary) hypertension: Secondary | ICD-10-CM

## 2021-11-06 NOTE — Progress Notes (Signed)
Patient ID: Frank [name], male   DOB: May 25, 1941, 81 y.o.   MRN: [id number] ? ? ? ?    Chief Complaint: follow up left leg pain and weakness as PCP not available ? ?     HPI:  Frank [name] is a 81 y.o. male here with wife, overall doing ok but has persistent left upper leg pain and reduced strength, recent xray suggestive of quad tearing fragmentation of enthesophyte, getting PT at home but not really improveing, still with general weakness, not really progressing.  Pt denies chest pain, increased sob or doe, wheezing, orthopnea, PND, increased LE swelling, palpitations, dizziness or syncope.   Pt denies polydipsia, polyuria, or new focal neuro s/s.   Denies urinary symptoms such as dysuria, frequency, urgency, flank pain, hematuria or n/v, fever, chills.  Pt has been in hospice but improved now followed per palliative care.  Seen recently in ED with suprapubic catheter change.  Did have some transient gross hematuria now improved.  Most recent urine cx Neg at 4/20 ED visit.   ?  ?Wt Readings from Last 3 Encounters:  ?10/30/21 178 lb (80.7 kg)  ?10/20/21 178 lb (80.7 kg)  ?09/28/21 175 lb (79.4 kg)  ? ?BP Readings from Last 3 Encounters:  ?11/06/21 124/68  ?10/30/21 (!) 171/83  ?10/22/21 130/60  ? ?      ?Past Medical History:  ?Diagnosis Date  ? Allergy   ? Rhinitis  ? Anemia   ? NOS iron deficient and B12 deficient  ? Arthritis   ? 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 Diabetes mellitus   ? Type 2  ? GERD (gastroesophageal reflux disease)   ? Hyperlipidemia   ? Hypertension   ? 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 OSA (obstructive sleep apnea)   ? cpap  ? Presence of permanent cardiac pacemaker   ? TBI (traumatic brain injury) ([place])   ? ?Past Surgical History:  ?Procedure Laterality Date  ? APPENDECTOMY    ? arm fracture Left 2011  ? with hardware  ? BURR HOLE Bilateral 08/04/2017  ? Procedure: Haskell Flirt;  Surgeon: [name], MD;  Location: [facility];  Service: Neurosurgery;  Laterality: Bilateral;   ? COLONOSCOPY    ? CRANIOTOMY Left 12/01/2017  ? Procedure: Trudee Kuster Holes CRANIOTOMY HEMATOMA EVACUATION SUBDURAL;  Surgeon: [name], MD;  Location: [facility];  Service: Neurosurgery;  Laterality: Left;  ? ESOPHAGOGASTRODUODENOSCOPY  2006  ? gastritis  ? IR ANGIO INTRA EXTRACRAN SEL COM CAROTID INNOMINATE UNI L MOD SED  03/31/2021  ? IR ANGIO VERTEBRAL SEL SUBCLAVIAN INNOMINATE UNI R MOD SED  03/31/2021  ? IR CT HEAD LTD  03/31/2021  ? IR INTRAVSC STENT CERV CAROTID W/EMB-PROT MOD SED INCL ANGIO  03/31/2021  ? IR PERCUTANEOUS ART THROMBECTOMY/INFUSION INTRACRANIAL INC DIAG ANGIO  09/09/2020  ? IR RADIOLOGIST EVAL & MGMT  03/10/2021  ? IR RADIOLOGIST EVAL & MGMT  04/28/2021  ? PACEMAKER IMPLANT N/A 05/14/2020  ? Procedure: PACEMAKER IMPLANT;  Surgeon: [name], MD;  Location: [facility];  Service: Cardiovascular;  Laterality: N/A;  ? RADIOLOGY WITH ANESTHESIA N/A 09/09/2020  ? Procedure: IR WITH ANESTHESIA;  Surgeon: Radiologist, Medication, MD;  Location: [facility];  Service: Radiology;  Laterality: N/A;  ? RADIOLOGY WITH ANESTHESIA N/A 03/31/2021  ? Procedure: IR WITH ANESTHESIA STENT PLACEMENT;  Surgeon: Luanne Bras, MD;  Location: [facility];  Service: Radiology;  Laterality: N/A;  ? ROTATOR CUFF REPAIR    ?  SEPTOPLASTY  1959  ? Deviated Septum  ? 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 ? reports that he quit smoking about 38 years ago. His smoking use included cigarettes. He smoked an average of 1 pack per day. He has never used smokeless tobacco. He reports current alcohol use of about 2.0 standard drinks per week. He reports that he does not use drugs. ?family history includes Allergies in his father; Breast cancer in his mother; Coronary artery disease in his brother; Heart disease in his mother; Stroke in his father. ?Allergies  ?Allergen Reactions  ? Lisinopril Cough  ? Invokana [Canagliflozin] Other (See Comments)  ?  Stopped by MD, unknown reaction   ? Penicillins Other (See Comments)  ?  Allergic as a  child.  Patient does not remember reaction.  Has had cephalasporins without problems.  ? Sulfamethoxazole Other (See Comments)  ?  Unknown reaction- from childhood  ? ?Current Outpatient Medications on File Prior to Visit  ?Medication Sig Dispense Refill  ? aspirin 81 MG chewable tablet Chew 1 tablet (81 mg total) by mouth daily.    ? atorvastatin (LIPITOR) 10 MG tablet Take 1 tablet (10 mg total) by mouth daily. (Patient taking differently: Take 5 mg by mouth daily.)    ? BRILINTA 90 MG TABS tablet Take 1/2 (one-half) tablet by mouth twice daily 180 tablet 0  ? Ferric Maltol (ACCRUFER) 30 MG CAPS Take 1 capsule by mouth in the morning and at bedtime. 180 capsule 1  ? FLUoxetine (PROZAC) 20 MG capsule TAKE 1 CAPSULE BY MOUTH  DAILY 90 capsule 3  ? glipiZIDE (GLUCOTROL XL) 10 MG 24 hr tablet Take 1 tablet (10 mg total) by mouth daily. 90 tablet 0  ? insulin glargine, 2 Unit Dial, (TOUJEO MAX SOLOSTAR) 300 UNIT/ML Solostar Pen Inject 10 Units into the skin daily. 6 mL 1  ? Insulin Pen Needle 32G X 6 MM MISC 1 Act by Does not apply route daily. 100 each 1  ? lactose free nutrition (BOOST) LIQD Take 237 mLs by mouth 2 (two) times daily between meals.    ? levETIRAcetam (KEPPRA) 500 MG tablet Take 1 tablet by mouth twice daily 180 tablet 0  ? mineral oil-hydrophilic petrolatum (AQUAPHOR) ointment Apply 1 application topically as needed for dry skin.    ? PRESCRIPTION MEDICATION CPAP- At bedtime    ? solifenacin (VESICARE) 10 MG tablet TAKE 1 TABLET BY MOUTH  DAILY 90 tablet 3  ? tamsulosin (FLOMAX) 0.4 MG CAPS capsule Take 1 capsule (0.4 mg total) by mouth daily after supper. 30 capsule 0  ? ?No current facility-administered medications on file prior to visit.  ? ?     ROS:  All others reviewed and negative. ? ?Objective  ? ?     PE:  BP 124/68   Pulse 80   Resp 18   Ht '5\' 10"'$  (1.778 m)   SpO2 98%   BMI 25.54 kg/m?  ? ?              Constitutional: Pt appears in NAD ?              HENT: Head: NCAT.  ?               Right Ear: External ear normal.   ?              Left Ear: External ear normal.  ?  Eyes: . Pupils are equal, round, and reactive to light. Conjunctivae and EOM are normal ?              Nose: without d/c or deformity ?              Neck: Neck supple. Gross normal ROM ?              Cardiovascular: Normal rate and regular rhythm.   ?              Pulmonary/Chest: Effort normal and breath sounds without rales or wheezing.  ?              Abd:  Soft, NT, ND, + BS, no organomegaly ?              Neurological: Pt is alert. At baseline orientation, motor grossly intact ?              Skin: Skin is warm. No rashes, no other new lesions, LE edema - none ?              Psychiatric: Pt behavior is normal without agitation  ? ?Micro: none ? ?Cardiac tracings I have personally interpreted today:  none ? ?Pertinent Radiological findings (summarize): none  ? ?Lab Results  ?Component Value Date  ? WBC 6.4 10/30/2021  ? HGB 11.7 (L) 10/30/2021  ? HCT 37.4 (L) 10/30/2021  ? PLT 134 (L) 10/30/2021  ? GLUCOSE 98 10/30/2021  ? CHOL 104 11/19/2020  ? TRIG 91 11/19/2020  ? HDL 33 (L) 11/19/2020  ? [place] 53 11/19/2020  ? ALT 8 10/30/2021  ? AST 11 (L) 10/30/2021  ? NA 138 10/30/2021  ? K 3.9 10/30/2021  ? CL 102 10/30/2021  ? CREATININE 1.52 (H) 10/30/2021  ? BUN 33 (H) 10/30/2021  ? CO2 29 10/30/2021  ? TSH 0.459 11/18/2020  ? PSA 1.08 07/10/2015  ? INR 0.9 03/30/2021  ? HGBA1C 7.1 (H) 05/19/2021  ? MICROALBUR 67.2 04/23/2020  ? ?Assessment/Plan:  ?Izaias Krupka is a 81 y.o. White or Caucasian [1] male with  has a past medical history of Allergy, Anemia, Arthritis, AV block, Mobitz 2 (05/14/2020), Diabetes mellitus, GERD (gastroesophageal reflux disease), Hyperlipidemia, Hypertension, Neuropathy (2001), OSA (obstructive sleep apnea), Presence of permanent cardiac pacemaker, and TBI (traumatic brain injury) ([place]). ? ?Left leg pain ?With possible acute injury as per recent femur film - to cont PT but needs sport med  eval as well. ? ?Urinary tract infection without hematuria ?Resolved, needs HH for monthly catheter changes ? ?Essential hypertension ?BP Readings from Last 3 Encounters:  ?11/06/21 124/68  ?10/30/21 (!) 171/83

## 2021-11-09 ENCOUNTER — Encounter: Payer: Self-pay | Admitting: Internal Medicine

## 2021-11-09 DIAGNOSIS — M79605 Pain in left leg: Secondary | ICD-10-CM | POA: Insufficient documentation

## 2021-11-09 NOTE — Assessment & Plan Note (Signed)
BP Readings from Last 3 Encounters:  ?11/06/21 124/68  ?10/30/21 (!) 171/83  ?10/22/21 130/60  ? ?Stable, pt to continue medical treatment  - diet, wt control ? ?

## 2021-11-09 NOTE — Patient Instructions (Signed)
Please stop at first floor for appt with Sports Medicine ? ?Please to continue PT with home health ?

## 2021-11-09 NOTE — Assessment & Plan Note (Signed)
With possible acute injury as per recent femur film - to cont PT but needs sport med eval as well. ?

## 2021-11-09 NOTE — Assessment & Plan Note (Signed)
Resolved, needs HH for monthly catheter changes ?

## 2021-11-10 ENCOUNTER — Encounter: Payer: Self-pay | Admitting: Family Medicine

## 2021-11-10 ENCOUNTER — Ambulatory Visit: Payer: Medicare Other | Admitting: Family Medicine

## 2021-11-10 ENCOUNTER — Telehealth: Payer: Medicare Other | Admitting: Family Medicine

## 2021-11-10 DIAGNOSIS — Z515 Encounter for palliative care: Secondary | ICD-10-CM | POA: Diagnosis not present

## 2021-11-10 NOTE — Progress Notes (Signed)
? ? ?Manufacturing engineer ?Community Palliative Care Consult Note ?Telephone: [phone]  ?Fax: [phone]  ? ?Date of encounter: 11/10/21 ?2:37 PM ?PATIENT NAME: Frank [name] ?[name] [address]   ?[phone] (home)  ?DOB: Mar 06, 1941 ?MRN: [id number] ?PRIMARY CARE PROVIDER:    ?Frank Lima, MD,  ?[place] [address] ?[phone] ? ?REFERRING PROVIDER:   ?Frank Lima, MD ?[place],  [place] [address] ?[phone] ? ?RESPONSIBLE PARTY:    ?Contact Information   ? ? Name Relation Home Work Mobile  ? Frank, [name] Spouse [phone]  [phone]  ? Frank, [name] Son   [phone]  ? Frank [name] Daughter   [phone]  ? ?  ? ?I connected with  Frank [name]  and his wife Frank [name] on 11/10/21 by a video enabled telemedicine application and verified that I am speaking with the correct person using two identifiers. ?  ?I discussed the limitations of evaluation and management by telemedicine. The patient expressed understanding and agreed to proceed.  ? ?Palliative Care was asked to follow this patient by consultation request of  Frank Lima, MD to address advance care planning and complex medical decision making. This is the initial visit.  ? ? ?      ASSESSMENT, SYMPTOM MANAGEMENT AND PLAN / RECOMMENDATIONS:  ? Palliative Care Encounter ?Re-introduction to Palliative Care. ?Advised wife that if home care hasn't called to establish services by Friday to contact Palliative NP to see if I can facilitate orders from PCP. ?Home visit set up for 11/26/21. ? ? ? ?Follow up Palliative Care Visit: Palliative care will continue to follow for complex medical decision making, advance care planning, and clarification of goals. Return 2 weeks or prn. ? ? ? ?This visit was coded based on medical decision making (MDM). ? ?PPS: 50% ? ?HOSPICE ELIGIBILITY/DIAGNOSIS: TBD ? 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   ? ?HISTORY OF PRESENT ILLNESS:  Frank [name] "Frank [name]" is a 81 y.o. year old male with dementia who has had a traumatic brain injury and subsequent stroke. His TBI with ICH and SDH  was 4 years ago and he has big deficits from subsequent stroke.  He was with Hospice but has improved and is no longer meeting Medicare criteria to continue.  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 His last HGB A1c was listed on 05/19/21 at 7.1%, down from prior 7.7% on 02/10/21. He has also had issues with BPH, OAB and urinary retention.  He has had to have a SP cath placed and has had recurrent UTI. He sees Frank [name], Urologist with [facility].  He will have home care to change suprapubic catheter monthly with orders being sent last week from PCP.   Having some nocturia, last UTI was with staph recently and had oral antibiotics x 10 days which has finally abated. He is able to walk a few steps in the bathroom with the assistance of handicapped rails and transfer from his wc to get on the commode.  He has been working with PT since his TBI per wife.  He also has an aide that she pays to come in and help with his personal care. Denies CP, SOB, constipation.  She states he is regular with a half dose of Miralax daily in his fluid of choice and eating prunes. ? ?History obtained from review of EMR, discussion with wife and/or Frank. [name].  ?I reviewed available labs, medications, imaging, studies and related documents from the EMR.  Records reviewed and summarized above.  ? ?ROS ?General: NAD ?EYES: denies vision  changes ?ENMT: denies dysphagia ?Cardiovascular: denies chest pain, denies DOE ?Pulmonary: denies cough, denies increased SOB ?Abdomen: endorses good appetite depending on what he is eating (eats more if he likes food being served), denies constipation, endorses continence of bowel ?GU: SP cath in place was changed last week, has some urge to void around catheter and at times has 3-4 times per night nocturia per wife ?MSK:  denies increased weakness unless has gotten up multiple times during the night to urinate, has residual from stroke, single fall reported while in the bathroom, lost his balance and landed on his bottom ?Skin: denies rashes or wounds ?Neurological: denies pain, denies insomnia ?Psych: Endorses positive mood ?Heme/lymph/immuno: denies bruises, abnormal bleeding ? ?Physical Exam: ?Current and past weights: 178 lbs as of 10/30/21, prior weight on 09/28/21 was 174.68 lbs ?Constitutional: NAD ?General: WNWD ?CV: No noted cyanosis.   ?Pulmonary: No audible dyspnea when talking, no audible wheezing and no cough ?MSK: wheelchair for mobility ?Psych: non-anxious affect ? ?CURRENT PROBLEM LIST:  ?Patient Active Problem List  ? Diagnosis Date Noted  ? Left leg pain 11/09/2021  ? Fall 10/22/2021  ? Urinary tract infection without hematuria 10/22/2021  ? Pacemaker 10/20/2021  ? Acute cystitis with hematuria 09/26/2021  ? Gross hematuria 09/25/2021  ? Sepsis secondary to UTI ([place]) 05/19/2021  ? Transient hypotension 05/19/2021  ? History of traumatic brain injury 05/19/2021  ? Carotid stenosis, symptomatic, with infarction ([place]) 03/31/2021  ? Infection of thumb 03/26/2021  ? Cellulitis of left hand 03/25/2021  ? Atherosclerosis of aorta ([place]) 02/10/2021  ? Generalized weakness 12/26/2020  ? Simple chronic bronchitis ([place]) 11/13/2020  ? Iron deficiency anemia secondary to inadequate dietary iron intake 11/06/2020  ? DNR (do not resuscitate) discussion 10/17/2020  ? Stroke (cerebrum) ([place]) 09/09/2020  ? Middle  cerebral artery embolism, 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  ? 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 ([place]) 11/28/2019  ? Chronic renal disease, stage 3, moderately decreased glomerular filtration rate (GFR) between 30-59 mL/min/1.73 square meter ([place]) 11/21/2018  ? GERD without esophagitis 07/22/2017  ? Depression   ? Type 2 diabetes mellitus with peripheral neuropathy ([name])   ? OAB (overactive bladder) 11/26/2014  ? Routine general medical examination at a health care facility 04/06/2014  ? Vitamin D deficiency 07/23/2010  ? Obstructive sleep apnea 09/13/2008  ? Hyperlipidemia with target LDL less than 100 07/27/2007  ? BPH (benign prostatic hyperplasia) 06/21/2007  ? OPTIC NEUROPATHY, 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  ? B12 deficiency anemia 04/15/2006  ? Essential hypertension 04/15/2006  ? Allergic rhinitis 04/15/2006  ? ?PAST MEDICAL HISTORY:  ?Active Ambulatory Problems  ?  Diagnosis Date Noted  ? Hyperlipidemia with target LDL less than 100 07/27/2007  ? B12 deficiency anemia 04/15/2006  ? Obstructive sleep apnea 09/13/2008  ? OPTIC NEUROPATHY, 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  ? Essential hypertension 04/15/2006  ? Allergic rhinitis 04/15/2006  ? BPH (benign prostatic hyperplasia) 06/21/2007  ? Vitamin D deficiency 07/23/2010  ? Routine general medical examination  at a health care facility 04/06/2014  ? OAB (overactive bladder) 11/26/2014  ? Depression   ? Type 2 diabetes mellitus with peripheral neuropathy ([name])   ? GERD without esophagitis 07/22/2017  ? Chronic renal disease, stage 3, moderately decreased glomerular filtration rate (GFR) between 30-59 mL/min/1.73 square meter ([place]) 11/21/2018  ? 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 ([place]) 11/28/2019  ? 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 Stroke (cerebrum) ([place]) 09/09/2020  ? Middle cerebral artery embolism, 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  ? DNR (do not resuscitate) discussion 10/17/2020  ? Iron  deficiency anemia secondary to inadequate dietary iron intake 11/06/2020  ? Simple chronic bronchitis ([place]) 11/13/2020  ? Generalized weakness 12/26/2020  ? Atherosclerosis of aorta ([place]) 02/10/2021  ? Cellulitis o

## 2021-11-11 ENCOUNTER — Ambulatory Visit: Payer: Medicare Other | Admitting: Family Medicine

## 2021-11-12 ENCOUNTER — Ambulatory Visit (INDEPENDENT_AMBULATORY_CARE_PROVIDER_SITE_OTHER): Payer: Medicare Other

## 2021-11-12 ENCOUNTER — Ambulatory Visit (INDEPENDENT_AMBULATORY_CARE_PROVIDER_SITE_OTHER): Payer: Medicare Other | Admitting: Family Medicine

## 2021-11-12 VITALS — BP 110/64 | HR 72 | Ht 70.0 in

## 2021-11-12 DIAGNOSIS — M5416 Radiculopathy, lumbar region: Secondary | ICD-10-CM

## 2021-11-12 DIAGNOSIS — M79605 Pain in left leg: Secondary | ICD-10-CM

## 2021-11-12 DIAGNOSIS — I441 Atrioventricular block, second degree: Secondary | ICD-10-CM | POA: Diagnosis not present

## 2021-11-12 DIAGNOSIS — M545 Low back pain, unspecified: Secondary | ICD-10-CM | POA: Diagnosis not present

## 2021-11-12 DIAGNOSIS — E538 Deficiency of other specified B group vitamins: Secondary | ICD-10-CM

## 2021-11-12 DIAGNOSIS — L821 Other seborrheic keratosis: Secondary | ICD-10-CM | POA: Diagnosis not present

## 2021-11-12 LAB — CUP PACEART REMOTE DEVICE CHECK
Battery Remaining Longevity: 137 mo
Battery Voltage: 3.03 V
Brady Statistic AP VP Percent: 0.43 %
Brady Statistic AP VS Percent: 0 %
Brady Statistic AS VP Percent: 99.32 %
Brady Statistic AS VS Percent: 0.25 %
Brady Statistic RA Percent Paced: 0.49 %
Brady Statistic RV Percent Paced: 99.74 %
Date Time Interrogation Session: 20230502211104
Implantable Lead Implant Date: 20211102
Implantable Lead Implant Date: 20211102
Implantable Lead Location: 753859
Implantable Lead Location: 753860
Implantable Lead Model: 3830
Implantable Lead Model: 5076
Implantable Pulse Generator Implant Date: 20211102
Lead Channel Impedance Value: 342 Ohm
Lead Channel Impedance Value: 437 Ohm
Lead Channel Impedance Value: 494 Ohm
Lead Channel Impedance Value: 513 Ohm
Lead Channel Pacing Threshold Amplitude: 0.5 V
Lead Channel Pacing Threshold Amplitude: 0.75 V
Lead Channel Pacing Threshold Pulse Width: 0.4 ms
Lead Channel Pacing Threshold Pulse Width: 0.4 ms
Lead Channel Sensing Intrinsic Amplitude: 11.75 mV
Lead Channel Sensing Intrinsic Amplitude: 11.75 mV
Lead Channel Sensing Intrinsic Amplitude: 2.875 mV
Lead Channel Sensing Intrinsic Amplitude: 2.875 mV
Lead Channel Setting Pacing Amplitude: 1.5 V
Lead Channel Setting Pacing Amplitude: 2 V
Lead Channel Setting Pacing Pulse Width: 0.4 ms
Lead Channel Setting Sensing Sensitivity: 1.2 mV

## 2021-11-12 MED ORDER — CYANOCOBALAMIN 1000 MCG/ML IJ SOLN
1000.0000 ug | Freq: Once | INTRAMUSCULAR | Status: AC
  Administered 2021-11-12: 1000 ug via INTRAMUSCULAR

## 2021-11-12 MED ORDER — GABAPENTIN 100 MG PO CAPS: 100.0000 mg | ORAL_CAPSULE | Freq: Three times a day (TID) | ORAL | 3 refills | Status: DC

## 2021-11-12 NOTE — Progress Notes (Signed)
After obtaining consent, and per orders of Dr. Ronnald Ramp, injection of B12 given by Max Sane. Patient tolerated injection well in right deltoid and was informed to report any adverse reaction to me immediately.  ?

## 2021-11-12 NOTE — Progress Notes (Signed)
? ?I, Peterson Lombard, LAT, ATC acting as a scribe for Lynne Leader, MD. ? ?Subjective:   ? ?CC: L thigh pain ? ?HPI: Pt is an 81 y/o male c/o L thigh pain after falling off of a toilet in early April. Pt has been in hospice but improved now followed per palliative care. Pt states pain will start in left thigh and radiate down to calf and vice versa. Sometimes will go down to the foot. Pain will be sharp and dull and ache. Pain is sporadic. Aspirin helps. Pain will would be worse at night and will feel in the morning, but dissipates throughout the day. This has been happening since last November and has been getting worse and more consistent. ? ?Dx imaging: 10/22/21 Bilat hips/pelvis, L femur, & L knee XR ? ?Pertinent review of Systems: No fevers or chills ? ?Relevant historical information: Diabetes.  History of stroke. ? ? ?Objective:   ? ?Vitals:  ? 11/12/21 1422  ?BP: 110/64  ?Pulse: 72  ?SpO2: 97%  ? ?General: Well Developed, well nourished, and in no acute distress.  ? ?MSK: L-spine: Nontender midline. ?Decreased lumbar motion. ?Lower extremity strength is decreased left hip flexion 4/5.  Otherwise lower extremity strength is otherwise intact generally. ?Reflexes are intact but diminished bilaterally and equal bilaterally. ?Sensation is intact throughout. ?Mildly positive left-sided slump test. ? ?Lab and Radiology Results ? ?X-ray images L-spine obtained today personally and independently interpreted ?Severe multilevel DDD without acute compression fracture visible. ?Await formal radiology review ? ?DG Knee Complete 4 Views Left ? ?Result Date: 10/23/2021 ?CLINICAL DATA:  Fall off commode 3 days ago. Left hip and upper leg and knee pain. EXAM: LEFT KNEE - COMPLETE 4+ VIEW; LEFT FEMUR 2 VIEWS COMPARISON:  None. FINDINGS: Left femur: There is diffuse decreased bone mineralization. Mild left femoroacetabular joint space narrowing with moderate peripheral acetabular degenerative osteophytosis. No acute fracture  is seen. No dislocation. Moderate vascular calcifications. Left knee: Mild-to-moderate medial compartment joint space narrowing. Moderate chronic enthesopathic change at the quadriceps and patellar insertions on the patella. There is segmentation/fragmentation at the junction of the inferior patella and the proximal patellar ossification that appears chronic although there may be mild anterior soft tissue swelling in this region seen on lateral view. No joint effusion. No dislocation. Moderate vascular calcifications are noted. IMPRESSION: 1. Mild-to-moderate left femoroacetabular and mild medial and patellofemoral compartment of the knee osteoarthritis. 2. There is moderate chronic enthesopathic change at the quadriceps and patellar insertions on the patella. There is segmentation/fragmentation at the junction of the inferior patella and the proximal patellar ossification that appears chronic ,although there may be mild anterior soft tissue swelling in this region. It is difficult to exclude an acute traumatic injury that may have caused fragmentation of this enthesophyte. Electronically Signed   By: Yvonne Kendall M.D.   On: 10/23/2021 17:25  ? ?CUP PACEART REMOTE DEVICE CHECK ? ?Result Date: 11/12/2021 ?Scheduled remote reviewed. Normal device function.  1 NSVT, 9 beats Next remote 91 days. LA ? ?DG FEMUR MIN 2 VIEWS LEFT ? ?Result Date: 10/23/2021 ?CLINICAL DATA:  Fall off commode 3 days ago. Left hip and upper leg and knee pain. EXAM: LEFT KNEE - COMPLETE 4+ VIEW; LEFT FEMUR 2 VIEWS COMPARISON:  None. FINDINGS: Left femur: There is diffuse decreased bone mineralization. Mild left femoroacetabular joint space narrowing with moderate peripheral acetabular degenerative osteophytosis. No acute fracture is seen. No dislocation. Moderate vascular calcifications. Left knee: Mild-to-moderate medial compartment joint space narrowing. Moderate chronic  enthesopathic change at the quadriceps and patellar insertions on the  patella. There is segmentation/fragmentation at the junction of the inferior patella and the proximal patellar ossification that appears chronic although there may be mild anterior soft tissue swelling in this region seen on lateral view. No joint effusion. No dislocation. Moderate vascular calcifications are noted. IMPRESSION: 1. Mild-to-moderate left femoroacetabular and mild medial and patellofemoral compartment of the knee osteoarthritis. 2. There is moderate chronic enthesopathic change at the quadriceps and patellar insertions on the patella. There is segmentation/fragmentation at the junction of the inferior patella and the proximal patellar ossification that appears chronic ,although there may be mild anterior soft tissue swelling in this region. It is difficult to exclude an acute traumatic injury that may have caused fragmentation of this enthesophyte. Electronically Signed   By: Yvonne Kendall M.D.   On: 10/23/2021 17:25  ? ?DG HIPS BILAT WITH PELVIS MIN 5 VIEWS ? ?Result Date: 10/23/2021 ?CLINICAL DATA:  Fall. EXAM: DG HIP (WITH OR WITHOUT PELVIS) 5+V BILAT COMPARISON:  None. FINDINGS: The bones are osteopenic. There is no evidence for acute fracture or dislocation. Joint spaces are maintained. Mild degenerative changes affect both hips. There are peripheral vascular calcifications present in the soft tissues. IMPRESSION: 1. No acute bony abnormality. 2. Mild degenerative changes of the hips. Electronically Signed   By: Ronney Asters M.D.   On: 10/23/2021 16:18   ? ?I, Lynne Leader, personally (independently) visualized and performed the interpretation of the images attached in this note. ? ? ?Impression and Recommendations:   ? ?Assessment and Plan: ?81 y.o. male with left leg pain thought to be primarily due to lumbar radiculopathy.  We talked about options.  He already is receiving home health physical therapy which I think is a great idea.  Plan to try a low-dose gabapentin.  We talked about prednisone.   Would like to avoid prednisone given his diabetes and hyperglycemia.  Also like to avoid opiates as this will cause constipation and confusion.  Recheck in 1 month.  Return sooner if needed.  Next step if needed could be MRI for epidural steroid injection planning however there are several barriers to this.  He does have a pacemaker which does appear to be MRI conditional.  He is also on a blood thinner.  Both of these are barriers but not insurmountable.. ? ?PDMP not reviewed this encounter. ?Orders Placed This Encounter  ?Procedures  ? DG Lumbar Spine 2-3 Views  ?  Standing Status:   Future  ?  Number of Occurrences:   1  ?  Standing Expiration Date:   11/13/2022  ?  Order Specific Question:   Reason for Exam (SYMPTOM  OR DIAGNOSIS REQUIRED)  ?  Answer:   leg pain  ?  Order Specific Question:   Preferred imaging location?  ?  Answer:   [facility]  ? ?Meds ordered this encounter  ?Medications  ? gabapentin (NEURONTIN) 100 MG capsule  ?  Sig: Take 1 capsule (100 mg total) by mouth 3 (three) times daily.  ?  Dispense:  30 capsule  ?  Refill:  3  ? ? ?Discussed warning signs or symptoms. Please see discharge instructions. Patient expresses understanding. ? ? ?The above documentation has been reviewed and is accurate and complete Lynne Leader, M.D. ? ?

## 2021-11-12 NOTE — Patient Instructions (Addendum)
Thank you for coming in today.  ? ?I've sent a prescription for Gabapentin to your pharmacy.  ? ?Please get an Xray today before you leave  ? ?Recheck back in 6 weeks ?

## 2021-11-14 NOTE — Progress Notes (Signed)
Lumbar spine x-ray shows diffuse arthritis changes.  No acute fractures are present.

## 2021-11-17 ENCOUNTER — Telehealth: Payer: Self-pay | Admitting: Internal Medicine

## 2021-11-17 NOTE — Telephone Encounter (Signed)
[facility] does not have the referral yet and patient is needing it to be sent in today if at all possible ?

## 2021-11-19 NOTE — Telephone Encounter (Signed)
Patient's wife, Frank [name] called back and states that [facility] has not received orders and patient's catheter changed ASAP. Wife's call back number is [phone] ?

## 2021-11-19 NOTE — Telephone Encounter (Signed)
I received a call back from [place] with [facility]. He stated that a referral was not received. However, he informed me that he had capabilities to view charts via Epic. He informed me that he would pull up the pt's chart to view since I reviewed the referral notes with him in regard. He took my contact information down for [place] to communicate via Epic community message if any additional information is needed.  ? ?Pt's wife, [name], has been informed that contact has been made and given an update.  ?

## 2021-11-19 NOTE — Telephone Encounter (Signed)
I have contacted pt's wife, [name], and received a contact name and number for [facility] Benjamine Mola [phone]). ? ?I have attempted to contact [facility] however, I had to leave a message to get a return call.  ?

## 2021-11-20 ENCOUNTER — Telehealth: Payer: Self-pay | Admitting: Family Medicine

## 2021-11-20 NOTE — Telephone Encounter (Signed)
Spouse has been requesting an agency to do monthly suprapubic cath changes.  PCP had sent orders to one agency who couldn't accept the referral.  Advised wife that I had spoken with [facility] who advised that they could not take patient if this was the only reason for referral.  Wife states that pt is fatigued after outing to the Urologist who is in [place] at [facility] and they have waited 3 hours before in the waiting room.  Pt does have an appt on Monday to have the catheter changed by a nurse in the Urology office at [facility].   ?Damaris Hippo FNP-C ?

## 2021-11-20 NOTE — Telephone Encounter (Signed)
Pt wife is calling back after speaking to [facility] and they advised her that at this time they do not have the ability to take on the pt for monthly care of suprapubic catheter. ? ?[facility] provided her with some recommendations that she wants to try: ? ?[place] department  ?Phone [phone] Fax [phone] ? ?[place] Phone [phone] ? ? ?

## 2021-11-24 DIAGNOSIS — R339 Retention of urine, unspecified: Secondary | ICD-10-CM | POA: Diagnosis not present

## 2021-11-24 DIAGNOSIS — Z466 Encounter for fitting and adjustment of urinary device: Secondary | ICD-10-CM | POA: Diagnosis not present

## 2021-11-24 DIAGNOSIS — Z9359 Other cystostomy status: Secondary | ICD-10-CM | POA: Diagnosis not present

## 2021-11-26 ENCOUNTER — Other Ambulatory Visit: Payer: Medicare Other | Admitting: Family Medicine

## 2021-11-26 VITALS — BP 113/72 | HR 76 | Resp 18

## 2021-11-26 DIAGNOSIS — G4730 Sleep apnea, unspecified: Secondary | ICD-10-CM

## 2021-11-26 DIAGNOSIS — Z515 Encounter for palliative care: Secondary | ICD-10-CM | POA: Diagnosis not present

## 2021-11-26 NOTE — Progress Notes (Signed)
Remote pacemaker transmission.   

## 2021-11-26 NOTE — Progress Notes (Signed)
[facility] Consult Note Telephone: [phone]  Fax: [phone]    Date of encounter: 11/26/21 12:03 PM PATIENT NAME: Frank [name] [address] [place] [address]   [phone] (home)  DOB: 1941/03/19 MRN: [id number] PRIMARY CARE PROVIDER:    Janith Lima, MD,  [place] [address] [phone]  REFERRING PROVIDER:   Janith Lima, MD [place],  [place] [address] [phone]  RESPONSIBLE PARTY:    Contact Information     Name Relation Home Work Mobile   [place] Spouse [phone]  [phone]   Arlin, Sass   [phone]   [name] Daughter   [phone]        I met face to face with patient, wife and caregiver in his home. Palliative Care was asked to follow this patient by consultation request of  Janith Lima, MD to address advance care planning and complex medical decision making. This is a follow up visit.                                   ASSESSMENT, SYMPTOM MANAGEMENT AND PLAN / RECOMMENDATIONS:  OSA Non-compliant with CPAP. Monitor for worsening.  2.  Palliative Care Encounter Has plan for suprapubic catheter replacement monthly at [facility]. Encourage frequent repositioning to avoid skin breakdown Encourage frequent sipping of fluids Q 15 minute while awake to maintain hydration. Avoid dehydrating fluids that have caffeine  Advance Care Planning/Goals of Care: Goals include to maximize quality of life and symptom management. Patient gave his permission to discuss. Our advance care planning conversation included a discussion about:    Review of an advance directive document-MOST . Decision not to resuscitate or to de-escalate disease focused treatments due to poor prognosis. CODE STATUS: MOST as of 11/20/20: DNR/DNI with limited additional intervention Use of antibiotics and IV fluids if indicated  No feeding  tube.     Follow up Palliative Care Visit: Palliative care will continue to follow for complex medical decision making, advance care planning, and clarification of goals. Return 4 weeks or prn.   This visit was coded based on medical decision making (MDM).  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  HISTORY OF PRESENT ILLNESS:  Frank [name] is a 81 y.o. 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 He also has peripheral and ischemic optic neuropathy, diabetes, aortic atherosclerosis, HTN, Mobitz II heart block s/p PPM, Carotid stenosis, OSA, allergic rhinitis, GERD, BPH with OAB, CKD stage 3, recent UTI with hematuria, Vitamin B12 deficiency anemia and Vitamin D deficiency.   On approach pt is seen sleeping in his bed.  Denies pain. Constipation has resolved with Miralax daily.  Had SP cath changed on Monday at [facility] and is shceduled for monthly cath changes there as home health cannot come in just for catheter changes.  Had PT yesterday.  No problems with nocturia since having catheter changed.  Eats well but is not drinking well. No falls. Wife states he has been very fatigued since the last catheter due to having to get up to go to the bathroom 4-5 times per night.  He can only transfer to and from bed/chair or commode.  On  10/30/2021 CMP was remarkable for elevated creatinine 1.52, BUN 33, and low AST of 11 (ALT normal at 8), EGFR 45.  On 10/30/2021 CBC was remarkable for low hemoglobin 11.7, hematocrit 37.4% and platelets 134.  Last hemoglobin A1c 05/19/2021 was 7.1%.  On 10/22/2021 UA with 100 of glucose, moderate blood, small leukocytes, positive nitrites, positive yeast, WBCs too numerous to count and few bacteria.  Current urine culture 10/30/2021 with less than 10,000 species.  Last  positive urine culture was 09/28/2021 with greater than 100,000 of Pseudomonas aeruginosa.  Lumbar spine 2-3 views on 11/12/2021 showed degenerative disc disease with possible partial fusion of SI joints and mild lower lumbar facet hypertrophy.  Last pacemaker interrogation was 5-23 with battery status reading is okay  History obtained from review of EMR, discussion with primary team, and interview with family, facility staff/caregiver and/or Frank [name].  I reviewed available labs, medications, imaging, studies and related documents from the EMR.  Records reviewed and summarized above.   ROS  General: NAD EYES: denies vision changes ENMT: denies dysphagia Cardiovascular: denies chest pain, denies DOE Pulmonary: denies cough, denies increased SOB Abdomen: endorses good appetite but not drinking well, denies constipation, endorses continence of bowel GU: denies dysuria, suprapubic cath  MSK:  denies increased weakness, endorses fatigue, no falls reported Skin: denies rashes or wounds Neurological: denies pain, denies insomnia Psych: Endorses irritability Heme/lymph/immuno: denies bruises, abnormal bleeding  Physical Exam: Current and past weights: 178 lbs as of 10/30/2021 Constitutional: Pale and NAD General: frail appearing EYES: anicteric sclera, lids intact, no discharge  ENMT: intact hearing, oral mucous membranes moist, dentition intact CV: S1S2, RRR with LUSB murmur, no LE edema Pulmonary: CTAB, no increased work of breathing, no cough, room air. Noted short periods of apnea Abdomen: normo-active BS + 4 quadrants, soft and non tender, no ascites GU: deferred MSK: no sarcopenia, moves all extremities Skin: warm and dry, no rashes or wounds on visible skin, mild decreased skin turgor and loss of subcu fat of ribs Neuro:  no generalized weakness,  no cognitive impairment Psych: non-anxious affect, sleepy but arousable and O x 3 Hem/lymph/immuno: no widespread bruising   Thank you  for the opportunity to participate in the care of Frank [name].  The palliative care team will continue to follow. Please call our office at [phone] if we can be of additional assistance.   Marijo Conception, FNP -C  COVID-19 PATIENT SCREENING TOOL Asked and negative response unless otherwise noted:   Have you had symptoms of covid, tested positive or been in contact with someone with symptoms/positive test in the past 5-10 days? No

## 2021-11-30 ENCOUNTER — Encounter: Payer: Self-pay | Admitting: Family Medicine

## 2021-11-30 DIAGNOSIS — R0602 Shortness of breath: Secondary | ICD-10-CM | POA: Insufficient documentation

## 2021-12-02 ENCOUNTER — Other Ambulatory Visit: Payer: Self-pay | Admitting: Internal Medicine

## 2021-12-02 DIAGNOSIS — E785 Hyperlipidemia, unspecified: Secondary | ICD-10-CM

## 2021-12-02 DIAGNOSIS — E118 Type 2 diabetes mellitus with unspecified complications: Secondary | ICD-10-CM

## 2021-12-04 ENCOUNTER — Other Ambulatory Visit: Payer: Self-pay | Admitting: Internal Medicine

## 2021-12-04 DIAGNOSIS — E119 Type 2 diabetes mellitus without complications: Secondary | ICD-10-CM

## 2021-12-04 DIAGNOSIS — E1142 Type 2 diabetes mellitus with diabetic polyneuropathy: Secondary | ICD-10-CM

## 2021-12-09 ENCOUNTER — Other Ambulatory Visit: Payer: Self-pay | Admitting: Internal Medicine

## 2021-12-09 DIAGNOSIS — E785 Hyperlipidemia, unspecified: Secondary | ICD-10-CM

## 2021-12-09 DIAGNOSIS — E118 Type 2 diabetes mellitus with unspecified complications: Secondary | ICD-10-CM

## 2021-12-10 ENCOUNTER — Ambulatory Visit: Payer: Medicare Other | Admitting: Neurology

## 2021-12-11 ENCOUNTER — Telehealth: Payer: Self-pay | Admitting: Internal Medicine

## 2021-12-11 ENCOUNTER — Other Ambulatory Visit: Payer: Self-pay | Admitting: Internal Medicine

## 2021-12-11 DIAGNOSIS — E785 Hyperlipidemia, unspecified: Secondary | ICD-10-CM

## 2021-12-11 DIAGNOSIS — E1142 Type 2 diabetes mellitus with diabetic polyneuropathy: Secondary | ICD-10-CM

## 2021-12-11 DIAGNOSIS — R569 Unspecified convulsions: Secondary | ICD-10-CM

## 2021-12-11 MED ORDER — ATORVASTATIN CALCIUM 10 MG PO TABS
5.0000 mg | ORAL_TABLET | Freq: Every day | ORAL | 1 refills | Status: DC
Start: 2021-12-11 — End: 2022-02-15

## 2021-12-11 MED ORDER — TOUJEO MAX SOLOSTAR 300 UNIT/ML ~~LOC~~ SOPN: 10.0000 [IU] | PEN_INJECTOR | Freq: Every day | SUBCUTANEOUS | 0 refills | Status: DC

## 2021-12-11 NOTE — Telephone Encounter (Signed)
1.Medication Requested: atorvastatin (LIPITOR) 10 MG tablet  insulin glargine, 2 Unit Dial, (TOUJEO MAX SOLOSTAR) 300 UNIT/ML Solostar Pen  2. Pharmacy (Name, [place], [facility]): [facility] ([facility] ) - [place], [place] Phone:  [phone]  Fax:  [phone]     3. On Med List: yes  4. Last Visit with PCP:  5. Next visit date with PCP:   Agent: Please be advised that RX refills may take up to 3 business days. We ask that you follow-up with your pharmacy.

## 2021-12-15 ENCOUNTER — Ambulatory Visit (INDEPENDENT_AMBULATORY_CARE_PROVIDER_SITE_OTHER): Payer: Medicare Other | Admitting: Internal Medicine

## 2021-12-15 ENCOUNTER — Ambulatory Visit: Payer: Medicare Other

## 2021-12-15 ENCOUNTER — Encounter: Payer: Self-pay | Admitting: Internal Medicine

## 2021-12-15 VITALS — BP 108/60 | HR 70 | Temp 98.2°F | Ht 70.0 in | Wt 176.0 lb

## 2021-12-15 DIAGNOSIS — N390 Urinary tract infection, site not specified: Secondary | ICD-10-CM | POA: Diagnosis not present

## 2021-12-15 DIAGNOSIS — N3001 Acute cystitis with hematuria: Secondary | ICD-10-CM | POA: Diagnosis not present

## 2021-12-15 DIAGNOSIS — E1142 Type 2 diabetes mellitus with diabetic polyneuropathy: Secondary | ICD-10-CM | POA: Diagnosis not present

## 2021-12-15 DIAGNOSIS — D510 Vitamin B12 deficiency anemia due to intrinsic factor deficiency: Secondary | ICD-10-CM | POA: Diagnosis not present

## 2021-12-15 DIAGNOSIS — B3749 Other urogenital candidiasis: Secondary | ICD-10-CM | POA: Diagnosis not present

## 2021-12-15 DIAGNOSIS — D508 Other iron deficiency anemias: Secondary | ICD-10-CM

## 2021-12-15 LAB — URINALYSIS, ROUTINE W REFLEX MICROSCOPIC
Bilirubin Urine: NEGATIVE
Nitrite: NEGATIVE
Specific Gravity, Urine: >=1.03 — AB (ref 1.000–1.030)
Total Protein, Urine: >=300 — AB
Urine Glucose: NEGATIVE
Urobilinogen, UA: 0.2 (ref 0.0–1.0)
pH: 6 (ref 5.0–8.0)

## 2021-12-15 LAB — BASIC METABOLIC PANEL
BUN: 30 mg/dL — ABNORMAL HIGH (ref 6–23)
CO2: 30 mEq/L (ref 19–32)
Calcium: 9.3 mg/dL (ref 8.4–10.5)
Chloride: 104 mEq/L (ref 96–112)
Creatinine, Ser: 1.53 mg/dL — ABNORMAL HIGH (ref 0.40–1.50)
GFR: 41.86 mL/min — ABNORMAL LOW (ref >60.00)
Glucose, Bld: 124 mg/dL — ABNORMAL HIGH (ref 70–99)
Potassium: 4.5 mEq/L (ref 3.5–5.1)
Sodium: 140 mEq/L (ref 135–145)

## 2021-12-15 LAB — CBC WITH DIFFERENTIAL/PLATELET
Basophils Absolute: 0.1 10*3/uL (ref 0.0–0.1)
Basophils Relative: 0.7 % (ref 0.0–3.0)
Eosinophils Absolute: 0.4 10*3/uL (ref 0.0–0.7)
Eosinophils Relative: 4.8 % (ref 0.0–5.0)
HCT: 37.6 % — ABNORMAL LOW (ref 39.0–52.0)
Hemoglobin: 12.3 g/dL — ABNORMAL LOW (ref 13.0–17.0)
Lymphocytes Relative: 18.2 % (ref 12.0–46.0)
Lymphs Abs: 1.5 10*3/uL (ref 0.7–4.0)
MCHC: 32.7 g/dL (ref 30.0–36.0)
MCV: 84.5 fl (ref 78.0–100.0)
Monocytes Absolute: 0.9 10*3/uL (ref 0.1–1.0)
Monocytes Relative: 11.4 % (ref 3.0–12.0)
Neutro Abs: 5.2 10*3/uL (ref 1.4–7.7)
Neutrophils Relative %: 64.9 % (ref 43.0–77.0)
Platelets: 148 10*3/uL — ABNORMAL LOW (ref 150.0–400.0)
RBC: 4.44 Mil/uL (ref 4.22–5.81)
RDW: 14.3 % (ref 11.5–15.5)
WBC: 8 10*3/uL (ref 4.0–10.5)

## 2021-12-15 LAB — IBC + FERRITIN
Ferritin: 45.9 ng/mL (ref 22.0–322.0)
Iron: 71 ug/dL (ref 42–165)
Saturation Ratios: 26.7 % (ref 20.0–50.0)
TIBC: 266 ug/dL (ref 250.0–450.0)
Transferrin: 190 mg/dL — ABNORMAL LOW (ref 212.0–360.0)

## 2021-12-15 LAB — FOLATE: Folate: 9.1 ng/mL (ref >5.9)

## 2021-12-15 LAB — HEMOGLOBIN A1C: Hgb A1c MFr Bld: 6.5 % (ref 4.6–6.5)

## 2021-12-15 MED ORDER — FLUCONAZOLE 150 MG PO TABS: 150.0000 mg | ORAL_TABLET | Freq: Every day | ORAL | 0 refills | Status: AC

## 2021-12-15 NOTE — Progress Notes (Signed)
Subjective:  Patient ID: Jayshon Dommer, male    DOB: 02-24-1941  Age: 81 y.o. MRN: [id number]  CC: Follow-up (Patient's wife c/o patient's urine being cloudy. Check for possible UTI), Urinary Tract Infection, Anemia, and Diabetes   HPI Dmauri Rosenow presents for f/up -  His wife reports that he has improved but he has but she is concerned that his urine has turned cloudy over the last 3 days.  Outpatient Medications Prior to Visit  Medication Sig Dispense Refill   aspirin 81 MG chewable tablet Chew 1 tablet (81 mg total) by mouth daily.     atorvastatin (LIPITOR) 10 MG tablet Take 0.5 tablets (5 mg total) by mouth daily. 45 tablet 1   BRILINTA 90 MG TABS tablet Take 1/2 (one-half) tablet by mouth twice daily 180 tablet 0   Ferric Maltol (ACCRUFER) 30 MG CAPS Take 1 capsule by mouth in the morning and at bedtime. 180 capsule 1   FLUoxetine (PROZAC) 20 MG capsule TAKE 1 CAPSULE BY MOUTH  DAILY 90 capsule 3   glipiZIDE (GLUCOTROL XL) 10 MG 24 hr tablet TAKE 1 TABLET BY MOUTH DAILY 90 tablet 0   insulin glargine, 2 Unit Dial, (TOUJEO MAX SOLOSTAR) 300 UNIT/ML Solostar Pen Inject 10 Units into the skin daily. 6 mL 0   Insulin Pen Needle 32G X 6 MM MISC 1 Act by Does not apply route daily. 100 each 1   lactose free nutrition (BOOST) LIQD Take 237 mLs by mouth 2 (two) times daily between meals.     levETIRAcetam (KEPPRA) 500 MG tablet Take 1 tablet by mouth twice daily 180 tablet 0   mineral oil-hydrophilic petrolatum (AQUAPHOR) ointment Apply 1 application topically as needed for dry skin.     PRESCRIPTION MEDICATION CPAP- At bedtime     gabapentin (NEURONTIN) 100 MG capsule Take 1 capsule (100 mg total) by mouth 3 (three) times daily. 30 capsule 3   solifenacin (VESICARE) 10 MG tablet TAKE 1 TABLET BY MOUTH  DAILY (Patient not taking: Reported on 12/15/2021) 90 tablet 3   tamsulosin (FLOMAX) 0.4 MG CAPS capsule Take 1 capsule (0.4 mg total) by mouth daily after supper. (Patient  not taking: Reported on 12/15/2021) 30 capsule 0   No facility-administered medications prior to visit.    ROS Review of Systems  Constitutional:  Negative for chills, diaphoresis, fatigue and fever.  HENT: Negative.    Respiratory:  Negative for cough, chest tightness, shortness of breath and wheezing.   Cardiovascular:  Negative for chest pain, palpitations and leg swelling.  Gastrointestinal: Negative.  Negative for abdominal pain, constipation, diarrhea and nausea.  Genitourinary:  Positive for dysuria. Negative for difficulty urinating, flank pain, frequency, hematuria, testicular pain and urgency.  Musculoskeletal: Negative.   Skin: Negative.   Neurological:  Positive for weakness. Negative for dizziness.  Hematological:  Negative for adenopathy. Does not bruise/bleed easily.  Psychiatric/Behavioral: Negative.      Objective:  BP 108/60 (BP Location: Right Arm, Patient Position: Sitting, Cuff Size: Large)   Pulse 70   Temp 98.2 F (36.8 C) (Oral)   Ht '5\' 10"'$  (1.778 m)   Wt 176 lb (79.8 kg)   SpO2 95%   BMI 25.25 kg/m   BP Readings from Last 3 Encounters:  12/15/21 108/60  11/26/21 113/72  11/12/21 110/64    Wt Readings from Last 3 Encounters:  12/15/21 176 lb (79.8 kg)  10/30/21 178 lb (80.7 kg)  10/20/21 178 lb (80.7 kg)    Physical  Exam Vitals reviewed.  Constitutional:      General: He is not in acute distress.    Appearance: He is ill-appearing (in a wheelchair). He is not toxic-appearing or diaphoretic.  HENT:     Nose: Nose normal.     Mouth/Throat:     Mouth: Mucous membranes are moist.  Eyes:     General: No scleral icterus.    Conjunctiva/sclera: Conjunctivae normal.  Cardiovascular:     Rate and Rhythm: Normal rate and regular rhythm.     Heart sounds: No murmur heard. Pulmonary:     Effort: Pulmonary effort is normal.     Breath sounds: No stridor. No wheezing, rhonchi or rales.  Abdominal:     General: Abdomen is flat.     Palpations:  There is no mass.     Tenderness: There is no abdominal tenderness. There is no guarding or rebound.     Hernia: No hernia is present.  Musculoskeletal:        General: Normal range of motion.     Cervical back: Neck supple.     Right lower leg: No edema.     Left lower leg: No edema.  Lymphadenopathy:     Cervical: No cervical adenopathy.  Skin:    General: Skin is warm and dry.     Coloration: Skin is pale.  Neurological:     Mental Status: He is alert. Mental status is at baseline.  Psychiatric:        Mood and Affect: Mood normal.        Behavior: Behavior normal.     Lab Results  Component Value Date   WBC 8.0 12/15/2021   HGB 12.3 (L) 12/15/2021   HCT 37.6 (L) 12/15/2021   PLT 148.0 (L) 12/15/2021   GLUCOSE 124 (H) 12/15/2021   CHOL 104 11/19/2020   TRIG 91 11/19/2020   HDL 33 (L) 11/19/2020   LDLCALC 53 11/19/2020   ALT 8 10/30/2021   AST 11 (L) 10/30/2021   NA 140 12/15/2021   K 4.5 12/15/2021   CL 104 12/15/2021   CREATININE 1.53 (H) 12/15/2021   BUN 30 (H) 12/15/2021   CO2 30 12/15/2021   TSH 0.459 11/18/2020   PSA 1.08 07/10/2015   INR 0.9 03/30/2021   HGBA1C 6.5 12/15/2021   MICROALBUR 67.2 04/23/2020    No results found.  Assessment & Plan:   Izaia was seen today for follow-up, urinary tract infection, anemia and diabetes.  Diagnoses and all orders for this visit:  Type 2 diabetes mellitus with peripheral neuropathy ([place])- His blood sugar is well controlled. -     Basic metabolic panel; Future -     Hemoglobin A1c; Future -     Hemoglobin A1c -     Basic metabolic panel  Acute cystitis with hematuria- Clx is negative for the UA was positive for yeast. -     Urinalysis, Routine w reflex microscopic; Future -     CULTURE, URINE COMPREHENSIVE; Future -     CULTURE, URINE COMPREHENSIVE -     Urinalysis, Routine w reflex microscopic  Urinary tract infection without hematuria, site unspecified -     Urinalysis, Routine w reflex microscopic;  Future -     CULTURE, URINE COMPREHENSIVE; Future -     CULTURE, URINE COMPREHENSIVE -     Urinalysis, Routine w reflex microscopic  Iron deficiency anemia secondary to inadequate dietary iron intake -     IBC + Ferritin; Future -  CBC with Differential/Platelet; Future -     CBC with Differential/Platelet -     IBC + Ferritin  Vitamin B12 deficiency anemia due to intrinsic factor deficiency -     Cancel: Vitamin B12; Future -     CBC with Differential/Platelet; Future -     Folate; Future -     Folate -     CBC with Differential/Platelet  Yeast UTI -     fluconazole (DIFLUCAN) 150 MG tablet; Take 1 tablet (150 mg total) by mouth daily for 10 days.   I have discontinued [name] [name] "ken"'s tamsulosin, solifenacin, and gabapentin. I am also having him start on fluconazole. Additionally, I am having him maintain his Insulin Pen Needle, PRESCRIPTION MEDICATION, aspirin, lactose free nutrition, mineral oil-hydrophilic petrolatum, ACCRUFeR, Brilinta, FLUoxetine, glipiZIDE, atorvastatin, Toujeo Max SoloStar, and levETIRAcetam.  Meds ordered this encounter  Medications   fluconazole (DIFLUCAN) 150 MG tablet    Sig: Take 1 tablet (150 mg total) by mouth daily for 10 days.    Dispense:  10 tablet    Refill:  0     Follow-up: Return in about 3 months (around 03/17/2022).  Scarlette Calico, MD

## 2021-12-15 NOTE — Patient Instructions (Signed)
Anemia  Anemia is a condition in which there is not enough red blood cells or hemoglobin in the blood. Hemoglobin is a substance in red blood cells that carries oxygen. When you do not have enough red blood cells or hemoglobin (are anemic), your body cannot get enough oxygen and your organs may not work properly. As a result, you may feel very tired or have other problems. What are the causes? Common causes of anemia include: Excessive bleeding. Anemia can be caused by excessive bleeding inside or outside the body, including bleeding from the intestines or from heavy menstrual periods in females. Poor nutrition. Long-lasting (chronic) kidney, thyroid, and liver disease. Bone marrow disorders, spleen problems, and blood disorders. Cancer and treatments for cancer. HIV (human immunodeficiency virus) and AIDS (acquired immunodeficiency syndrome). Infections, medicines, and autoimmune disorders that destroy red blood cells. What are the signs or symptoms? Symptoms of this condition include: Minor weakness. Dizziness. Headache, or difficulties concentrating and sleeping. Heartbeats that feel irregular or faster than normal (palpitations). Shortness of breath, especially with exercise. Pale skin, lips, and nails, or cold hands and feet. Indigestion and nausea. Symptoms may occur suddenly or develop slowly. If your anemia is mild, you may not have symptoms. How is this diagnosed? This condition is diagnosed based on blood tests, your medical history, and a physical exam. In some cases, a test may be needed in which cells are removed from the soft tissue inside of a bone and looked at under a microscope (bone marrow biopsy). Your health care provider may also check your stool (feces) for blood and may do additional testing to look for the cause of your bleeding. Other tests may include: Imaging tests, such as a CT scan or MRI. A procedure to see inside your esophagus and stomach (endoscopy). A  procedure to see inside your colon and rectum (colonoscopy). How is this treated? Treatment for this condition depends on the cause. If you continue to lose a lot of blood, you may need to be treated at a hospital. Treatment may include: Taking supplements of iron, vitamin B12, or folic acid. Taking a hormone medicine (erythropoietin) that can help to stimulate red blood cell growth. Having a blood transfusion. This may be needed if you lose a lot of blood. Making changes to your diet. Having surgery to remove your spleen. Follow these instructions at home: Take over-the-counter and prescription medicines only as told by your health care provider. Take supplements only as told by your health care provider. Follow any diet instructions that you were given by your health care provider. Keep all follow-up visits as told by your health care provider. This is important. Contact a health care provider if: You develop new bleeding anywhere in the body. Get help right away if: You are very weak. You are short of breath. You have pain in your abdomen or chest. You are dizzy or feel faint. You have trouble concentrating. You have bloody stools, black stools, or tarry stools. You vomit repeatedly or you vomit up blood. These symptoms may represent a serious problem that is an emergency. Do not wait to see if the symptoms will go away. Get medical help right away. Call your local emergency services (911 in the [place] Do not drive yourself to the hospital. Summary Anemia is a condition in which you do not have enough red blood cells or enough of a substance in your red blood cells that carries oxygen (hemoglobin). Symptoms may occur suddenly or develop slowly. If your anemia   is mild, you may not have symptoms. This condition is diagnosed with blood tests, a medical history, and a physical exam. Other tests may be needed. Treatment for this condition depends on the cause of the anemia. This  information is not intended to replace advice given to you by your health care provider. Make sure you discuss any questions you have with your health care provider. Document Revised: 05/13/2021 Document Reviewed: 06/06/2019 Elsevier Patient Education  2023 Elsevier Inc.  

## 2021-12-16 ENCOUNTER — Telehealth: Payer: Self-pay

## 2021-12-16 NOTE — Telephone Encounter (Signed)
Pharmacy calling stating that because pt is only taking 10 units daily the recommended Rx is the insulin glargine, 2 Unit Dial, (TOUJEO SOLOSTAR) 300 UNIT/ML Solostar Pen instead of the insulin glargine, 2 Unit Dial, (TOUJEO MAX SOLOSTAR) 300 UNIT/ML Solostar Pen. She is asking if its ok to switch.  Dr. Ronnald Ramp Replied yes it was ok for the switch Via Secure Chat.  Pharmacy call was disconnected right after I placed her on hold.  FYI

## 2021-12-17 LAB — CULTURE, URINE COMPREHENSIVE

## 2021-12-22 ENCOUNTER — Telehealth: Payer: Self-pay

## 2021-12-22 NOTE — Telephone Encounter (Signed)
Pt wife is calling stating that pharmacy was needing the Rx for TOUJEO MAX SOLOSTAR) 300 UNIT/ML Solostar Pen.   I called the pharmacy and the update from Dr. Ronnald Ramp from 12/16/21 stating that it was ok to give the (TOUJEO SOLOSTAR) 300 UNIT/ML Solostar Pen to follow unit dosing guidelines.  Hina P, Pharmacist was able to take that information and proceed with filling the pt Rx.  I called the pt wife to give her an update and she had no further questions  FYI

## 2021-12-22 NOTE — Telephone Encounter (Signed)
Noted  

## 2021-12-23 ENCOUNTER — Encounter: Payer: Self-pay | Admitting: Family Medicine

## 2021-12-23 ENCOUNTER — Ambulatory Visit (INDEPENDENT_AMBULATORY_CARE_PROVIDER_SITE_OTHER): Payer: Medicare Other | Admitting: Family Medicine

## 2021-12-23 VITALS — BP 106/60 | HR 74 | Ht 70.0 in

## 2021-12-23 DIAGNOSIS — M5416 Radiculopathy, lumbar region: Secondary | ICD-10-CM

## 2021-12-23 DIAGNOSIS — M79605 Pain in left leg: Secondary | ICD-10-CM | POA: Diagnosis not present

## 2021-12-23 NOTE — Progress Notes (Signed)
   I, Wendy Poet, LAT, ATC, am serving as scribe for Dr. Lynne Leader.  Natalio Salois is a 81 y.o. male who presents to [facility] today for f/u L leg and low back pain, thought to be due to lumbar radiculopathy. Pt was last seen by Dr. Georgina Snell on 11/12/21 and was prescribed low-dose gabapentin, and advised to cont home health PT. Today, pt reports that he's doing pretty well w/ intermittent pain in his lower back and L leg pain.  He is no longer taking the Gabapentin as he did not notice any change in symptoms w/ initial dose.  Dx imaging: 11/12/21 Lumbar XR 10/22/21 Bilat hips/pelvis, L femur, & L knee XR  Pertinent review of systems: No fevers or chills  Relevant historical information: Diabetes.  Heart disease.  History of stroke.   Exam:  BP 106/60 (BP Location: Left Arm, Patient Position: Sitting, Cuff Size: Normal)   Pulse 74   Ht '5\' 10"'$  (1.778 m)   SpO2 94%   BMI 25.25 kg/m  General: Well Developed, well nourished, and in no acute distress.   MSK: Left leg.  Nontender normal leg motion.  Strength generally intact hip flexion knee extension hip abduction and adduction.    Lab and Radiology Results   EXAM: LUMBAR SPINE - 2-3 VIEW   COMPARISON:  None Available.   FINDINGS: There are 5 non-rib-bearing lumbar vertebra. The bones are subjectively under mineralized. Diffuse degenerative disc disease with disc space narrowing and endplate spurring, most prominently affecting L3-L4 and L5-S1. There is mild lower lumbar facet hypertrophy. No evidence of vertebral body compression fracture. No visible focal bone lesion or bone destruction. The sacroiliac joints may be partially fused.   IMPRESSION: 1. Diffuse degenerative disc disease. 2. Mild lower lumbar facet hypertrophy.     Electronically Signed   By: Keith Rake M.D.   On: 11/13/2021 11:46   I, Lynne Leader, personally (independently) visualized and performed the  interpretation of the images attached in this note.     Assessment and Plan: 81 y.o. male with left lumbar radiculopathy.  Improved with a little bit of time and help PT as well as very intermittent gabapentin.  Plan for watchful waiting as his pain has improved quite a bit.  Recheck back as needed.  Precautions reviewed.  Total encounter time 20 minutes including face-to-face time with the patient and, reviewing past medical record, and charting on the date of service.       Discussed warning signs or symptoms. Please see discharge instructions. Patient expresses understanding.   The above documentation has been reviewed and is accurate and complete Lynne Leader, M.D.

## 2021-12-23 NOTE — Patient Instructions (Addendum)
Good to see you today. ° °Follow-up as needed. °

## 2021-12-24 ENCOUNTER — Other Ambulatory Visit: Payer: Medicare Other | Admitting: Family Medicine

## 2021-12-24 VITALS — BP 100/62 | HR 70 | Resp 18

## 2021-12-24 DIAGNOSIS — E11649 Type 2 diabetes mellitus with hypoglycemia without coma: Secondary | ICD-10-CM | POA: Diagnosis not present

## 2021-12-24 DIAGNOSIS — R531 Weakness: Secondary | ICD-10-CM

## 2021-12-24 NOTE — Progress Notes (Signed)
[facility] Consult Note Telephone: [phone]  Fax: [phone]    Date of encounter: 12/24/21 12:19 PM PATIENT NAME: Frank [name] [address] [place] [address]   [phone] (home)  DOB: 09/29/1940 MRN: [id number] PRIMARY CARE PROVIDER:    Janith Lima, MD,  [place] [address] [phone]  REFERRING PROVIDER:   Janith Lima, MD [place],  [place] [address] [phone]  RESPONSIBLE PARTY:    Contact Information     Name Relation Home Work Mobile   [place] Spouse [phone]  [phone]   Colbi, Staubs   [phone]   [name] Daughter   [phone]        I met face to face with patient, wife and caregiver in his home. Palliative Care was asked to follow this patient by consultation request of  Janith Lima, MD to address advance care planning and complex medical decision making. This is a follow up visit   ASSESSMENT, SYMPTOM MANAGEMENT AND PLAN / RECOMMENDATIONS:  Hypoglycemia with Type 2 DM Last HGB A1c 6.5% on 12/15/21 Fasting blood sugars for the last month primarily less than 70 with hypoglycemic unawareness. D/C glipizide for now particularly with variable intake, continue Toujeo unless pt does not eat dinner or want to take Ensure. Wife to email blood sugar log Friday for further instruction.  2.  Generalized weakness Improved, likely secondary to improve sleep pattern and nutritional intake. PPS improved from 40-50 with pt sitting up eating his own breakfast and moving more.    CODE STATUS: MOST as of 11/20/20: DNR/DNI with limited additional intervention Use of antibiotics and IV fluids if indicated  No feeding tube.   Follow up Palliative Care Visit: Palliative care will continue to follow for complex medical decision making, advance care planning, and clarification of goals. Return 4 weeks or prn, email contact  with wife on Friday to follow up blood sugars.   This visit was coded based on medical decision making (MDM).  PPS: 50%  HOSPICE ELIGIBILITY/DIAGNOSIS: TBD  Chief Complaint:  Palliative Care is following for chronic medical management in setting of dementia with hx of stroke with residual deficits.  HISTORY OF PRESENT ILLNESS:  Frank [name] is a 81 y.o. 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 He also has peripheral and ischemic optic neuropathy, diabetes, aortic atherosclerosis, HTN, Mobitz II heart block s/p PPM, Carotid stenosis, OSA, allergic rhinitis, GERD, BPH with OAB, CKD stage 3, recent UTI with hematuria, Vitamin B12 deficiency anemia and Vitamin D deficiency.   On approach pt is seen sitting at the table eating brunch.  Denies pain. Constipation has resolved with Miralax daily.  Fatigues easily with activity. Had a pinched nerve in the leg. Sees Dr Candiss Norse Nephrologist for follow up next week and recently treated with course of Fluconazole due to yeast in urine. He takes Toujeo 10 units nightly and Glipizide 10 mg daily. Blood sugars are running 45-73.  Only had 3 fasting blood sugars in 3 weeks 80 or higher and none over 100.  Denies feeling shaky, jittery and no nausea or vomiting.  Food intake can be variable as fastng blood sugar today was low in upper 40s but he refused dinner last night and Ensure.  Educated family that blood sugar needs to be fasting 90-110.He had an episode on Sunday of waking up at 2 am confused and wanting coffee and  couldn't get him re-oriented or back to bed until after 4 am.  Denies CP, SOB or falls.  Since SP cath changed pt is not waking up at night as much with urge to void.   History obtained from review of EMR, discussion with primary team, and interview with family, facility staff/caregiver and/or Mr. Fleet.  I reviewed available labs, medications, imaging, studies and related documents from  the EMR.  Records reviewed and summarized above.   ROS  General: NAD EYES: denies vision changes ENMT: denies dysphagia Cardiovascular: denies chest pain, denies DOE Pulmonary: denies cough, denies increased SOB Abdomen: endorses variable appetite, constipation improved with bowel regimen, endorses continence of bowel GU: suprapubic cath to gravity drainage with anticipated change next week MSK:  denies increased weakness, no falls reported Skin: denies rashes or wounds Neurological: denies pain, denies insomnia Psych: intermittent confusion Heme/lymph/immuno: denies bruises, abnormal bleeding  Physical Exam: Current and past weights: 178 lbs as of 10/30/2021 Constitutional: NAD General: thin and WD, bright EYES: anicteric sclera, lids intact, no discharge  ENMT: intact hearing, oral mucous membranes moist, dentition intact CV: S1S2, RRR with LUSB murmur, no LE edema Pulmonary: CTAB, no increased work of breathing, no cough, room air.  Abdomen: normo-active BS + 4 quadrants, soft and non tender, no ascites GU: deferred MSK: moves all extremities Skin: warm and dry, no rashes or wounds on visible skin Neuro:  no generalized weakness,  noted short term memory deficits Psych: non-anxious affect, Awake, alert and O x 3, bright and interactive Hem/lymph/immuno: no widespread bruising   Thank you for the opportunity to participate in the care of Mr. Dagmawi Venable.  The palliative care team will continue to follow. Please call our office at [phone] if we can be of additional assistance.   Marijo Conception, FNP -C  COVID-19 PATIENT SCREENING TOOL Asked and negative response unless otherwise noted:   Have you had symptoms of covid, tested positive or been in contact with someone with symptoms/positive test in the past 5-10 days? No

## 2021-12-25 ENCOUNTER — Encounter: Payer: Self-pay | Admitting: Family Medicine

## 2021-12-25 DIAGNOSIS — N2581 Secondary hyperparathyroidism of renal origin: Secondary | ICD-10-CM | POA: Diagnosis not present

## 2021-12-25 DIAGNOSIS — I129 Hypertensive chronic kidney disease with stage 1 through stage 4 chronic kidney disease, or unspecified chronic kidney disease: Secondary | ICD-10-CM | POA: Diagnosis not present

## 2021-12-25 DIAGNOSIS — N1832 Chronic kidney disease, stage 3b: Secondary | ICD-10-CM | POA: Diagnosis not present

## 2021-12-25 DIAGNOSIS — Z9359 Other cystostomy status: Secondary | ICD-10-CM | POA: Diagnosis not present

## 2021-12-25 DIAGNOSIS — D631 Anemia in chronic kidney disease: Secondary | ICD-10-CM | POA: Diagnosis not present

## 2021-12-25 DIAGNOSIS — E11649 Type 2 diabetes mellitus with hypoglycemia without coma: Secondary | ICD-10-CM | POA: Insufficient documentation

## 2021-12-25 DIAGNOSIS — N390 Urinary tract infection, site not specified: Secondary | ICD-10-CM | POA: Diagnosis not present

## 2021-12-26 ENCOUNTER — Encounter: Payer: Self-pay | Admitting: Internal Medicine

## 2021-12-29 DIAGNOSIS — Z9359 Other cystostomy status: Secondary | ICD-10-CM | POA: Diagnosis not present

## 2021-12-29 DIAGNOSIS — R82998 Other abnormal findings in urine: Secondary | ICD-10-CM | POA: Diagnosis not present

## 2021-12-29 DIAGNOSIS — Z435 Encounter for attention to cystostomy: Secondary | ICD-10-CM | POA: Diagnosis not present

## 2021-12-29 DIAGNOSIS — R339 Retention of urine, unspecified: Secondary | ICD-10-CM | POA: Diagnosis not present

## 2021-12-29 DIAGNOSIS — R800 Isolated proteinuria: Secondary | ICD-10-CM | POA: Diagnosis not present

## 2021-12-29 DIAGNOSIS — R338 Other retention of urine: Secondary | ICD-10-CM | POA: Diagnosis not present

## 2021-12-29 DIAGNOSIS — R310 Gross hematuria: Secondary | ICD-10-CM | POA: Diagnosis not present

## 2022-01-14 ENCOUNTER — Ambulatory Visit: Payer: Medicare Other

## 2022-01-16 ENCOUNTER — Ambulatory Visit (INDEPENDENT_AMBULATORY_CARE_PROVIDER_SITE_OTHER): Payer: Medicare Other

## 2022-01-16 DIAGNOSIS — D510 Vitamin B12 deficiency anemia due to intrinsic factor deficiency: Secondary | ICD-10-CM | POA: Diagnosis not present

## 2022-01-16 MED ORDER — CYANOCOBALAMIN 1000 MCG/ML IJ SOLN
1000.0000 ug | Freq: Once | INTRAMUSCULAR | Status: AC
  Administered 2022-01-16: 1000 ug via INTRAMUSCULAR

## 2022-01-16 NOTE — Progress Notes (Signed)
Patient here for monthly B12 injection per Dr. Jones. B12 1000 mcg given left IM and patient tolerated injection well today.  

## 2022-01-17 ENCOUNTER — Other Ambulatory Visit: Payer: Self-pay | Admitting: Internal Medicine

## 2022-01-17 DIAGNOSIS — I6601 Occlusion and stenosis of right middle cerebral artery: Secondary | ICD-10-CM

## 2022-01-19 NOTE — Progress Notes (Signed)
New Patient Office Visit  Subjective    Patient ID: Frank [name], male    DOB: October 22, 1940  Age: 81 y.o. MRN: [id number]  CC: No chief complaint on file.   HPI Frank [name] presents to establish care   Outpatient Encounter Medications as of 01/16/2022  Medication Sig   aspirin 81 MG chewable tablet Chew 1 tablet (81 mg total) by mouth daily.   atorvastatin (LIPITOR) 10 MG tablet Take 0.5 tablets (5 mg total) by mouth daily.   Ferric Maltol (ACCRUFER) 30 MG CAPS Take 1 capsule by mouth in the morning and at bedtime.   FLUoxetine (PROZAC) 20 MG capsule TAKE 1 CAPSULE BY MOUTH  DAILY   insulin glargine, 2 Unit Dial, (TOUJEO MAX SOLOSTAR) 300 UNIT/ML Solostar Pen Inject 10 Units into the skin daily.   Insulin Pen Needle 32G X 6 MM MISC 1 Act by Does not apply route daily.   lactose free nutrition (BOOST) LIQD Take 237 mLs by mouth 2 (two) times daily between meals.   levETIRAcetam (KEPPRA) 500 MG tablet Take 1 tablet by mouth twice daily   mineral oil-hydrophilic petrolatum (AQUAPHOR) ointment Apply 1 application topically as needed for dry skin.   PRESCRIPTION MEDICATION CPAP- At bedtime   [DISCONTINUED] BRILINTA 90 MG TABS tablet Take 1/2 (one-half) tablet by mouth twice daily   [EXPIRED] cyanocobalamin ((VITAMIN B-12)) injection 1,000 mcg    No facility-administered encounter medications on file as of 01/16/2022.    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   Cellulitis of left hand 03/25/2021   Diabetes mellitus    Type 2   GERD (gastroesophageal reflux disease)    Hyperlipidemia    Hypertension    Infection of thumb 03/26/2021   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, Ischemic optic   OSA (obstructive sleep apnea)    cpap   Presence of permanent cardiac pacemaker    Sepsis secondary to UTI ([facility]) 05/19/2021   TBI (traumatic brain injury) [facility])     Past  Surgical History:  Procedure Laterality Date   APPENDECTOMY     arm fracture Left 2011   with hardware   BURR HOLE Bilateral 08/04/2017   Procedure: Haskell Flirt;  Surgeon: [name], MD;  Location: [facility];  Service: Neurosurgery;  Laterality: Bilateral;   COLONOSCOPY     CRANIOTOMY Left 12/01/2017   Procedure: Trudee Kuster Holes CRANIOTOMY HEMATOMA EVACUATION SUBDURAL;  Surgeon: [name], MD;  Location: [facility];  Service: Neurosurgery;  Laterality: Left;   ESOPHAGOGASTRODUODENOSCOPY  2006   gastritis   IR ANGIO INTRA EXTRACRAN SEL COM CAROTID INNOMINATE UNI L MOD SED  03/31/2021   IR ANGIO VERTEBRAL SEL SUBCLAVIAN INNOMINATE UNI R MOD SED  03/31/2021   IR CT HEAD LTD  03/31/2021   IR INTRAVSC STENT CERV CAROTID W/EMB-PROT MOD SED INCL ANGIO  03/31/2021   IR PERCUTANEOUS ART THROMBECTOMY/INFUSION INTRACRANIAL INC DIAG ANGIO  09/09/2020   IR RADIOLOGIST EVAL & MGMT  03/10/2021   IR RADIOLOGIST EVAL & MGMT  04/28/2021   PACEMAKER IMPLANT N/A 05/14/2020   Procedure: PACEMAKER IMPLANT;  Surgeon: [name], MD;  Location: [facility];  Service: Cardiovascular;  Laterality: N/A;   RADIOLOGY WITH ANESTHESIA N/A 09/09/2020   Procedure: IR WITH ANESTHESIA;  Surgeon: Radiologist, Medication, MD;  Location: [facility];  Service: Radiology;  Laterality: N/A;   RADIOLOGY WITH ANESTHESIA N/A 03/31/2021   Procedure: IR WITH ANESTHESIA STENT PLACEMENT;  Surgeon: Luanne Bras, MD;  Location: [facility];  Service: Radiology;  Laterality: N/A;   ROTATOR CUFF REPAIR     SEPTOPLASTY  1959   Deviated Septum   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    Family History  Problem Relation Age of Onset   Heart disease Mother    Breast cancer Mother    Stroke Father    Allergies Father        Father and children   Coronary artery disease Brother    Diabetes Neg Hx    Colon cancer Neg Hx     Social History   Socioeconomic History   Marital status: Married    Spouse name: Not on file   Number of children: 1    Years of education: Not on file   Highest education level: Not on file  Occupational History   Not on file  Tobacco Use   Smoking status: Former    Packs/day: 1.00    Types: Cigarettes    Quit date: 07/14/1983    Years since quitting: 38.5   Smokeless tobacco: Never  Vaping Use   Vaping Use: Never used  Substance and Sexual Activity   Alcohol use: Yes    Alcohol/week: 2.0 standard drinks of alcohol    Types: 2 Standard drinks or equivalent per week    Comment: occassionally   Drug use: No   Sexual activity: Never  Other Topics Concern   Not on file  Social History Narrative   Right handed    Lives with wife   Has a caregiver    Social Determinants of Health   Financial Resource Strain: Low Risk  (12/22/2019)   Overall Financial Resource Strain (CARDIA)    Difficulty of Paying Living Expenses: Not hard at all  Food Insecurity: No Food Insecurity (11/07/2018)   Hunger Vital Sign    Worried About Running Out of Food in the Last Year: Never true    Ran Out of Food in the Last Year: Never true  Transportation Needs: No Transportation Needs (11/07/2018)   PRAPARE - Hydrologist (Medical): No    Lack of Transportation (Non-Medical): No  Physical Activity: Inactive (11/07/2018)   Exercise Vital Sign    Days of Exercise per Week: 0 days    Minutes of Exercise per Session: 0 min  Stress: No Stress Concern Present (11/07/2018)   [place]    Feeling of Stress : Not at all  Social Connections: Unknown (11/07/2018)   Social Connection and Isolation Panel [NHANES]    Frequency of Communication with Friends and Family: More than three times a week    Frequency of Social Gatherings with Friends and Family: More than three times a week    Attends Religious Services: Not on file    Active Member of Clubs or Organizations: Not on file    Attends Archivist Meetings: Not on file     Marital Status: Married  Intimate Partner Violence: Not At Risk (11/07/2018)   Humiliation, Afraid, Rape, and Kick questionnaire    Fear of Current or Ex-Partner: No    Emotionally Abused: No    Physically Abused: No    Sexually Abused: No    ROS      Objective    There were no vitals taken for this visit.  Physical Exam      Assessment & Plan:   Problem List Items Addressed This Visit       Other   B12 deficiency anemia - Primary    No follow-ups on file.   Marcina Millard, CMA

## 2022-01-21 ENCOUNTER — Other Ambulatory Visit: Payer: Medicare Other | Admitting: Family Medicine

## 2022-01-21 VITALS — BP 143/79 | HR 61

## 2022-01-21 DIAGNOSIS — F321 Major depressive disorder, single episode, moderate: Secondary | ICD-10-CM

## 2022-01-21 DIAGNOSIS — Z636 Dependent relative needing care at home: Secondary | ICD-10-CM | POA: Diagnosis not present

## 2022-01-21 DIAGNOSIS — E11649 Type 2 diabetes mellitus with hypoglycemia without coma: Secondary | ICD-10-CM | POA: Diagnosis not present

## 2022-01-25 ENCOUNTER — Encounter: Payer: Self-pay | Admitting: Family Medicine

## 2022-01-25 DIAGNOSIS — F321 Major depressive disorder, single episode, moderate: Secondary | ICD-10-CM | POA: Insufficient documentation

## 2022-01-25 DIAGNOSIS — Z636 Dependent relative needing care at home: Secondary | ICD-10-CM | POA: Insufficient documentation

## 2022-01-25 NOTE — Progress Notes (Signed)
[facility] Consult Note Telephone: [phone]  Fax: [phone]    Date of encounter: 01/21/2022 3:15 PM  PATIENT NAME: Frank [name] [address] [place] [address]   [phone] (home)  DOB: 1940-10-16 MRN: [id number] PRIMARY CARE PROVIDER:    Janith Lima, MD,  [place] [address] [phone]  REFERRING PROVIDER:   Janith Lima, MD [address] [place],  [place] [address] [phone]  RESPONSIBLE PARTY:    Contact Information     Name Relation Home Work Mobile   [place] Spouse [phone]  [phone]   Frank [name], [place]   [phone]   Frank [name] Daughter   [phone]        I met face to face with patient, wife, son and daughter in his home. Palliative Care was asked to follow this patient by consultation request of  Frank Lima, MD to address advance care planning and complex medical decision making. This is a follow up visit   ASSESSMENT, SYMPTOM MANAGEMENT AND PLAN / RECOMMENDATIONS:  Hypoglycemia with Type 2 DM Last HGB A1c 6.5% on 12/15/21 Fasting blood sugars for the last month 120-160 with isolated 210 off Glipizide. Has had 2 blood sugars in the 40s and 50s earlier in June. Continue Toujeo 2 units daily unless not eating.  2.  Major Depression, moderate episode Continues on Prozac 20 mg daily, sent message in Epic to Dr Frank [name] to address whether to increase dose vs change SSRI in same class or add another agent.  3.  Wife with caregiver stress Advised to accept offers from children to help with their Dad's care. Encouraged pt to avoid doing things that pt can do for himself without asking (e.g. straightening his glasses) SW referral to address potential resources to help on weekend, private pay. Discussed different methods to achieve pt's goal of being able to go to the synagogue with his friends. Encouraged wife in some self care  activities.    CODE STATUS: MOST as of 11/20/20: DNR/DNI with limited additional intervention Use of antibiotics and IV fluids if indicated  No feeding tube.   Follow up Palliative Care Visit: Palliative care will continue to follow for complex medical decision making, advance care planning, and clarification of goals. Return 4 weeks or prn.   This visit was coded based on medical decision making (MDM).  PPS: 50%  HOSPICE ELIGIBILITY/DIAGNOSIS: TBD  Chief Complaint:  Palliative Care is following up for chronic medical management in setting of dementia with stroke and traumatic brain injury.  HISTORY OF PRESENT ILLNESS:  Frank [name] is a 81 y.o. 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   Pt has his daughter in from out of town, his son who lives in town and helps, his wife and their caregiver.  Wife states that Dr Frank [name] was in agreement with stopping Glipizide given pt's sugars.  Pt takes Toujeo 2 units nightly. Blood sugars are running 118-160s with outlier of 210.   Denies feeling shaky, jittery and no nausea or vomiting. Denies CP, SOB or falls. Hx obtained from all present including family, caregiver and pt.  Family states he is sleeping more,  lacks motivation to do anything and expresses a sincere desire to be present with friends for synagogue services.  Wife cannot get him in the car on her own and only has a caregiver Monday through Friday.  Son has offered to put pt to bed and let wife go to bed earlier but she won't allow it.  Wife states that the children don't know how to perform perianal hygiene if the pt has a BM.  Discussing son coming  to help get pt in the car for synagogue and in the house once home.  Pt and wife do not want to "impose" on their son "who helps enough".  Advised wife that although children may not do things the way she does they are there offering to help and she needs to allow them the opportunity to help with their father if that is their wish.  Advised her to avoid making excuses for why things weren't possible and take that time to rest, run errands or do something for herself.  She has to have some down time for herself.  Noted wife to spontaneously walk toward pt and without asking him adjust his glasses because she thought his vision was impaired.  Pt demonstrated significant frustration, pulling away, asking her not to do it and rolling his eyes.  Pt does not verbally express appreciation for the efforts his wife makes. Children are expressing frustration with wanting to help but pt and wife not allowing it.  Family concerned that pt's appetite waxes and wanes but he has no motivation to do anything.  He is on Prozac 20 mg daily which has been a longstanding dose. Advised pt that if he is not wanting to deteriorate in function that we must have his cooperation.  Verbalized understanding of the normalcy of his feeling of loss of control and encouraged wife to approach and ask him before performing some activity that pt could do for himself.    History obtained from review of EMR, interview with family, caregiver and/or [name].  I reviewed EMR and no new available labs, imaging, studies or related documents since last Palliative Visit.  No medication changes other than d/c of Glipizide and dose reduction of Toujeo.    ROS  General: NAD ENMT: denies dysphagia Cardiovascular: denies chest pain, denies DOE Pulmonary: denies cough, denies increased SOB Abdomen: endorses variable appetite, constipation improved with bowel regimen, endorses continence of bowel GU: suprapubic cath to gravity drainage with recent  change 12/29/21 MSK:  denies increased weakness, no falls reported Neurological: denies pain, denies insomnia Psych: flat affect, poor motivation, increased sleep Heme/lymph/immuno: denies bruises, abnormal bleeding  Physical Exam: Current and past weights 176 as of 12/15/2021, 178 lbs in April 2023 Constitutional: NAD General: thin and WD, bright EYES: anicteric sclera, lids intact, no discharge  ENMT: intact hearing, oral mucous membranes moist, dentition intact CV: S1S2, RRR with LUSB murmur, no LE edema Pulmonary: CTAB, no increased work of breathing, no cough, room air.  Abdomen: normo-active BS + 4 quadrants, soft and non tender GU: SP cath in place to gravity drainage  MSK: moves all extremities Neuro:  no generalized weakness,  noted short term memory deficits Psych:  flat affect, apathetic Awake, alert and O x 3   Thank you for the opportunity to participate in the care of Mr. Bara.  The palliative care team will continue to follow. Please call our office at [phone] if we can be of additional assistance.   Marijo Conception, FNP -  C  COVID-19 PATIENT SCREENING TOOL Asked and negative response unless otherwise noted:   Have you had symptoms of covid, tested positive or been in contact with someone with symptoms/positive test in the past 5-10 days? No

## 2022-01-27 ENCOUNTER — Other Ambulatory Visit: Payer: Self-pay | Admitting: Internal Medicine

## 2022-01-27 DIAGNOSIS — R339 Retention of urine, unspecified: Secondary | ICD-10-CM | POA: Diagnosis not present

## 2022-01-27 DIAGNOSIS — N3281 Overactive bladder: Secondary | ICD-10-CM | POA: Diagnosis not present

## 2022-01-27 DIAGNOSIS — R400 Somnolence: Secondary | ICD-10-CM

## 2022-01-27 DIAGNOSIS — Z9359 Other cystostomy status: Secondary | ICD-10-CM | POA: Diagnosis not present

## 2022-01-27 DIAGNOSIS — Z466 Encounter for fitting and adjustment of urinary device: Secondary | ICD-10-CM | POA: Diagnosis not present

## 2022-01-27 MED ORDER — METHYLPHENIDATE HCL ER (CD) 10 MG PO CPCR: 10.0000 mg | ORAL_CAPSULE | ORAL | 0 refills | Status: DC

## 2022-02-04 ENCOUNTER — Other Ambulatory Visit: Payer: Medicare Other

## 2022-02-04 DIAGNOSIS — Z789 Other specified health status: Secondary | ICD-10-CM

## 2022-02-04 NOTE — Progress Notes (Signed)
Palliative care SW outreached patient/family via telephone.    Spouse share that she has been needing assistance on the weekend to assist with getting patient in/out of the house to attend religious services on Saturday, spouse share that her son visits on Sunday and provides assistance. Spouse is hopeful that patient can be approved for caregivers hours on the weekend through his workers comp, as he is already receiving hours during the week. Spouse is expected to begin working on Sundays now from 8am-4pm, and hopes the caregivers will be approved to also assist and present during these times.     Resources/interventions: SW provided supportive reflective listening to daughter. Spouse has information for [facility] counseling support and [facility] has a support group that she's aware. Spouse also has transportation information to [place], [place] that can assist with transportation. SW will make spouse aware of any private duty caregivers thaqt may be available on the weekends.   Palliative care will continue to monitor and assist with long term care planning as needed.

## 2022-02-10 DIAGNOSIS — L821 Other seborrheic keratosis: Secondary | ICD-10-CM | POA: Diagnosis not present

## 2022-02-10 DIAGNOSIS — Z85828 Personal history of other malignant neoplasm of skin: Secondary | ICD-10-CM | POA: Diagnosis not present

## 2022-02-10 DIAGNOSIS — D692 Other nonthrombocytopenic purpura: Secondary | ICD-10-CM | POA: Diagnosis not present

## 2022-02-10 DIAGNOSIS — D1801 Hemangioma of skin and subcutaneous tissue: Secondary | ICD-10-CM | POA: Diagnosis not present

## 2022-02-10 DIAGNOSIS — L570 Actinic keratosis: Secondary | ICD-10-CM | POA: Diagnosis not present

## 2022-02-10 DIAGNOSIS — L814 Other melanin hyperpigmentation: Secondary | ICD-10-CM | POA: Diagnosis not present

## 2022-02-11 ENCOUNTER — Ambulatory Visit (INDEPENDENT_AMBULATORY_CARE_PROVIDER_SITE_OTHER): Payer: Medicare Other

## 2022-02-11 DIAGNOSIS — I441 Atrioventricular block, second degree: Secondary | ICD-10-CM

## 2022-02-11 LAB — CUP PACEART REMOTE DEVICE CHECK
Battery Remaining Longevity: 133 mo
Battery Voltage: 3.03 V
Brady Statistic AP VP Percent: 1.17 %
Brady Statistic AP VS Percent: 0 %
Brady Statistic AS VP Percent: 98.64 %
Brady Statistic AS VS Percent: 0.19 %
Brady Statistic RA Percent Paced: 1.23 %
Brady Statistic RV Percent Paced: 99.81 %
Date Time Interrogation Session: 20230802012939
Implantable Lead Implant Date: 20211102
Implantable Lead Implant Date: 20211102
Implantable Lead Location: 753859
Implantable Lead Location: 753860
Implantable Lead Model: 3830
Implantable Lead Model: 5076
Implantable Pulse Generator Implant Date: 20211102
Lead Channel Impedance Value: 342 Ohm
Lead Channel Impedance Value: 342 Ohm
Lead Channel Impedance Value: 399 Ohm
Lead Channel Impedance Value: 475 Ohm
Lead Channel Pacing Threshold Amplitude: 0.625 V
Lead Channel Pacing Threshold Amplitude: 0.875 V
Lead Channel Pacing Threshold Pulse Width: 0.4 ms
Lead Channel Pacing Threshold Pulse Width: 0.4 ms
Lead Channel Sensing Intrinsic Amplitude: 3 mV
Lead Channel Sensing Intrinsic Amplitude: 3 mV
Lead Channel Sensing Intrinsic Amplitude: 31.625 mV
Lead Channel Sensing Intrinsic Amplitude: 31.625 mV
Lead Channel Setting Pacing Amplitude: 1.5 V
Lead Channel Setting Pacing Amplitude: 2 V
Lead Channel Setting Pacing Pulse Width: 0.4 ms
Lead Channel Setting Sensing Sensitivity: 1.2 mV

## 2022-02-14 ENCOUNTER — Other Ambulatory Visit: Payer: Self-pay | Admitting: Internal Medicine

## 2022-02-14 DIAGNOSIS — E785 Hyperlipidemia, unspecified: Secondary | ICD-10-CM

## 2022-02-16 ENCOUNTER — Telehealth: Payer: Self-pay | Admitting: Internal Medicine

## 2022-02-16 ENCOUNTER — Other Ambulatory Visit: Payer: Self-pay | Admitting: Internal Medicine

## 2022-02-16 ENCOUNTER — Ambulatory Visit: Payer: Medicare Other

## 2022-02-16 DIAGNOSIS — E1142 Type 2 diabetes mellitus with diabetic polyneuropathy: Secondary | ICD-10-CM

## 2022-02-16 DIAGNOSIS — N3001 Acute cystitis with hematuria: Secondary | ICD-10-CM

## 2022-02-16 DIAGNOSIS — E11649 Type 2 diabetes mellitus with hypoglycemia without coma: Secondary | ICD-10-CM

## 2022-02-16 DIAGNOSIS — N1832 Chronic kidney disease, stage 3b: Secondary | ICD-10-CM

## 2022-02-16 DIAGNOSIS — B3749 Other urogenital candidiasis: Secondary | ICD-10-CM

## 2022-02-16 NOTE — Telephone Encounter (Signed)
Patients wife called and said that she believes patient has a UTI - she would like to bring in a clean catch sample to the lab - Can orders be placed.  She already has the specimen jar.  Please advise.

## 2022-02-17 ENCOUNTER — Other Ambulatory Visit (INDEPENDENT_AMBULATORY_CARE_PROVIDER_SITE_OTHER): Payer: Medicare Other

## 2022-02-17 DIAGNOSIS — N1832 Chronic kidney disease, stage 3b: Secondary | ICD-10-CM | POA: Diagnosis not present

## 2022-02-17 DIAGNOSIS — B3749 Other urogenital candidiasis: Secondary | ICD-10-CM

## 2022-02-17 DIAGNOSIS — E1142 Type 2 diabetes mellitus with diabetic polyneuropathy: Secondary | ICD-10-CM

## 2022-02-17 DIAGNOSIS — N3001 Acute cystitis with hematuria: Secondary | ICD-10-CM | POA: Diagnosis not present

## 2022-02-17 LAB — URINALYSIS, ROUTINE W REFLEX MICROSCOPIC
Bilirubin Urine: NEGATIVE
Nitrite: NEGATIVE
Specific Gravity, Urine: 1.015 (ref 1.000–1.030)
Total Protein, Urine: 100 — AB
Urine Glucose: NEGATIVE
Urobilinogen, UA: 1 (ref 0.0–1.0)
pH: 7 (ref 5.0–8.0)

## 2022-02-17 LAB — MICROALBUMIN / CREATININE URINE RATIO
Creatinine,U: 51.5 mg/dL
Microalb Creat Ratio: 126.6 mg/g — ABNORMAL HIGH (ref 0.0–30.0)
Microalb, Ur: 65.2 mg/dL — ABNORMAL HIGH (ref 0.0–1.9)

## 2022-02-17 NOTE — Telephone Encounter (Signed)
Called pt wife to relay message abut an order, LVM to call back

## 2022-02-18 ENCOUNTER — Other Ambulatory Visit: Payer: Self-pay | Admitting: Internal Medicine

## 2022-02-18 ENCOUNTER — Encounter (INDEPENDENT_AMBULATORY_CARE_PROVIDER_SITE_OTHER): Payer: Medicare Other | Admitting: Internal Medicine

## 2022-02-18 DIAGNOSIS — N3001 Acute cystitis with hematuria: Secondary | ICD-10-CM

## 2022-02-18 DIAGNOSIS — N390 Urinary tract infection, site not specified: Secondary | ICD-10-CM

## 2022-02-18 MED ORDER — CIPROFLOXACIN HCL 250 MG PO TABS: 250.0000 mg | ORAL_TABLET | Freq: Two times a day (BID) | ORAL | 0 refills | Status: AC

## 2022-02-18 NOTE — Telephone Encounter (Signed)
Pt came yesterday to have UA. Closing encounter.Marland KitchenJohny [name]

## 2022-02-20 ENCOUNTER — Ambulatory Visit: Payer: Medicare Other

## 2022-02-20 LAB — CULTURE, URINE COMPREHENSIVE

## 2022-02-20 NOTE — Telephone Encounter (Signed)
Please see the MyChart message reply(ies) for my assessment and plan.  The patient gave consent for this Medical Advice Message and is aware that it may result in a bill to their insurance company as well as the possibility that this may result in a co-payment or deductible. They are an established patient, but are not seeking medical advice exclusively about a problem treated during an in person or video visit in the last 7 days. I did not recommend an in person or video visit within 7 days of my reply.  I spent a total of 22 minutes cumulative time within 7 days through MyChart messaging Frank Calico, MD

## 2022-02-23 ENCOUNTER — Ambulatory Visit (INDEPENDENT_AMBULATORY_CARE_PROVIDER_SITE_OTHER): Payer: Medicare Other

## 2022-02-23 ENCOUNTER — Telehealth: Payer: Self-pay | Admitting: Internal Medicine

## 2022-02-23 ENCOUNTER — Other Ambulatory Visit: Payer: Self-pay | Admitting: Internal Medicine

## 2022-02-23 ENCOUNTER — Other Ambulatory Visit: Payer: Medicare Other

## 2022-02-23 DIAGNOSIS — D510 Vitamin B12 deficiency anemia due to intrinsic factor deficiency: Secondary | ICD-10-CM | POA: Diagnosis not present

## 2022-02-23 DIAGNOSIS — N3001 Acute cystitis with hematuria: Secondary | ICD-10-CM

## 2022-02-23 MED ORDER — CYANOCOBALAMIN 1000 MCG/ML IJ SOLN
1000.0000 ug | Freq: Once | INTRAMUSCULAR | Status: AC
  Administered 2022-02-23: 1000 ug via INTRAMUSCULAR

## 2022-02-23 NOTE — Telephone Encounter (Signed)
Urine sample was left while pt was in office for b12 injection.

## 2022-02-23 NOTE — Telephone Encounter (Signed)
Patient is coming in today for a B12 shot - wife would like for Dr. Ronnald Ramp to order another urinalysis just to make sure that he is over infection.  Please advise.

## 2022-02-23 NOTE — Progress Notes (Signed)
Patient seen in office today for monthly B12 injection.  per Dr. Ronnald Ramp. Medication administered in left deltoid and patient tolerated injection well.

## 2022-02-24 ENCOUNTER — Other Ambulatory Visit: Payer: Self-pay | Admitting: Internal Medicine

## 2022-02-24 DIAGNOSIS — E1142 Type 2 diabetes mellitus with diabetic polyneuropathy: Secondary | ICD-10-CM

## 2022-02-24 DIAGNOSIS — E119 Type 2 diabetes mellitus without complications: Secondary | ICD-10-CM

## 2022-02-24 DIAGNOSIS — R569 Unspecified convulsions: Secondary | ICD-10-CM

## 2022-02-25 ENCOUNTER — Other Ambulatory Visit: Payer: Self-pay | Admitting: Internal Medicine

## 2022-02-25 LAB — CULTURE, URINE COMPREHENSIVE

## 2022-02-27 ENCOUNTER — Other Ambulatory Visit: Payer: Self-pay | Admitting: Internal Medicine

## 2022-02-27 DIAGNOSIS — I6601 Occlusion and stenosis of right middle cerebral artery: Secondary | ICD-10-CM

## 2022-03-04 DIAGNOSIS — Z435 Encounter for attention to cystostomy: Secondary | ICD-10-CM | POA: Diagnosis not present

## 2022-03-04 DIAGNOSIS — R338 Other retention of urine: Secondary | ICD-10-CM | POA: Diagnosis not present

## 2022-03-06 NOTE — Progress Notes (Signed)
Remote pacemaker transmission.   

## 2022-03-12 ENCOUNTER — Other Ambulatory Visit: Payer: Medicare Other | Admitting: Family Medicine

## 2022-03-12 ENCOUNTER — Encounter: Payer: Self-pay | Admitting: Family Medicine

## 2022-03-12 VITALS — BP 128/72 | HR 76 | Resp 20

## 2022-03-12 DIAGNOSIS — R531 Weakness: Secondary | ICD-10-CM

## 2022-03-12 DIAGNOSIS — Z636 Dependent relative needing care at home: Secondary | ICD-10-CM | POA: Diagnosis not present

## 2022-03-12 DIAGNOSIS — F321 Major depressive disorder, single episode, moderate: Secondary | ICD-10-CM

## 2022-03-12 DIAGNOSIS — E11649 Type 2 diabetes mellitus with hypoglycemia without coma: Secondary | ICD-10-CM

## 2022-03-12 DIAGNOSIS — Z515 Encounter for palliative care: Secondary | ICD-10-CM | POA: Diagnosis not present

## 2022-03-12 NOTE — Progress Notes (Signed)
[facility] Consult Note Telephone: [phone]  Fax: [phone]    Date of encounter: 03/12/2022 12:05 PM  PATIENT NAME: Frank [name] [address] [place] [address]   [phone] (home)  DOB: Mar 14, 1941 MRN: [id number] PRIMARY CARE PROVIDER:    Janith Lima, MD,  [place] [address] [phone]  REFERRING PROVIDER:   Janith Lima, MD [place],  [place] [address] [phone]  RESPONSIBLE PARTY:    Contact Information     Name Relation Home Work Mobile   [place] Spouse [phone]  [phone]   Drayce, Tawil   [phone]   [name] Daughter   [phone]        I met face to face with patient, wife in his home. Palliative Care was asked to follow this patient by consultation request of  Janith Lima, MD to address advance care planning and complex medical decision making. This is a follow up visit   ASSESSMENT, SYMPTOM MANAGEMENT AND PLAN / RECOMMENDATIONS:  Hypoglycemia with Type 2 DM Hypoglycemia resolved.  DM well controlled Continue Toujeo 8 units daily.  2.  Major Depression, moderate episode Has not started on Prozac 40 mg daily More motivation currently  3.  Wife with caregiver stress Praised wife for self care activities she has been able to do. Encouraged pt to consider safety  4.  Generalized weakness Pt working with PT, able to ambulate some and go up and down steps Advised pt until released as safe per PT that he should not try to ambulate on his own without at least standby assist to decrease his risk of fall.  5.  Palliative Care Encounter Encouraged to get flu shot when available through PCP and if he has not had his bivalent covid vaccination to discuss with PCP if he needs to get that first.   CODE STATUS: MOST as of 11/20/20: DNR/DNI with limited additional intervention Use of antibiotics and IV fluids if  indicated  No feeding tube.   Follow up Palliative Care Visit: Palliative care will continue to follow for complex medical decision making, advance care planning, and clarification of goals. Return 6 weeks or prn.   This visit was coded based on medical decision making (MDM).  PPS: 50%  HOSPICE ELIGIBILITY/DIAGNOSIS: TBD  Chief Complaint:  Palliative Care is continuing to follow pt for chronic medical management in pt with hx of embolic stroke and urinary retention.  HISTORY OF PRESENT ILLNESS:  Frank [name] is a 81 y.o. 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    Pt takes Toujeo 8 units nightly. Blood sugars are running 92-140s with isolated 158, 160.   Denies CP, SOB or falls.  Has been walking with PT and going up and down stairs. Wife states in the last 2 days he has gotten up on his own, once without his walker to ambulate when no one was around.  He recently was treated by PCP for unusual organism for UTI.  His appetite is good, denies any coughing or choking after eating or drinking.  History obtained from review of EMR, interview with family, caregiver and/or Frank [name].  Urine culture positive 02/17/22 for Serratia marascens resistant to Augmentin, Cefazolin and Nitrofurantoin. On 02/11/22 he had pacemaker interrogation  I reviewed EMR for new available labs, imaging, studies and related documents since last Palliative visit with changes in Toujeo dose to 8 units.    ROS General: NAD ENMT: denies dysphagia Cardiovascular: denies chest pain, denies DOE Pulmonary: denies cough, denies increased SOB Abdomen: endorses good appetite,  denies constipation, endorses continence of bowel GU: suprapubic cath to gravity drainage with recent change  MSK:  endorses increased strength and function in BLE, no falls reported Neurological: denies pain, denies insomnia Psych: euthymic, brightens on approach Heme/lymph/immuno: denies bruises, abnormal bleeding  Physical Exam: Current and past weights 176 as of 12/15/2021, 178 lbs in April 2023 Constitutional: NAD General: WN and WD CV: S1S2, RRR with LUSB murmur, no LE edema Pulmonary: CTAB, no increased work of breathing, no cough, room air.  Abdomen: normo-active BS + 4 quadrants, soft and non tender GU: SP cath in place to gravity drainage  MSK: moves all extremities Neuro:  no generalized weakness,  noted short term memory deficits Psych: euthymic brightens on approach, A & O x 3   Thank you for the opportunity to participate in the care of Frank [name].  The palliative care team will continue to follow. Please call our office at [phone] if we can be of additional assistance.   Marijo Conception, FNP -C  COVID-19 PATIENT SCREENING TOOL Asked and negative response unless otherwise noted:   Have you had symptoms of covid, tested positive or been in contact with someone with symptoms/positive test in the past 5-10 days? No

## 2022-03-17 ENCOUNTER — Ambulatory Visit (INDEPENDENT_AMBULATORY_CARE_PROVIDER_SITE_OTHER): Payer: Medicare Other | Admitting: Internal Medicine

## 2022-03-17 ENCOUNTER — Encounter: Payer: Self-pay | Admitting: Internal Medicine

## 2022-03-17 VITALS — BP 120/62 | HR 71 | Temp 97.7°F | Wt 175.0 lb

## 2022-03-17 DIAGNOSIS — D510 Vitamin B12 deficiency anemia due to intrinsic factor deficiency: Secondary | ICD-10-CM | POA: Diagnosis not present

## 2022-03-17 DIAGNOSIS — I10 Essential (primary) hypertension: Secondary | ICD-10-CM

## 2022-03-17 DIAGNOSIS — N1832 Chronic kidney disease, stage 3b: Secondary | ICD-10-CM

## 2022-03-17 DIAGNOSIS — E1142 Type 2 diabetes mellitus with diabetic polyneuropathy: Secondary | ICD-10-CM | POA: Diagnosis not present

## 2022-03-17 DIAGNOSIS — D508 Other iron deficiency anemias: Secondary | ICD-10-CM | POA: Diagnosis not present

## 2022-03-17 LAB — BASIC METABOLIC PANEL
BUN: 24 mg/dL — ABNORMAL HIGH (ref 6–23)
CO2: 32 mEq/L (ref 19–32)
Calcium: 8.7 mg/dL (ref 8.4–10.5)
Chloride: 101 mEq/L (ref 96–112)
Creatinine, Ser: 1.31 mg/dL (ref 0.40–1.50)
GFR: 50.35 mL/min — ABNORMAL LOW (ref >60.00)
Glucose, Bld: 153 mg/dL — ABNORMAL HIGH (ref 70–99)
Potassium: 3.9 mEq/L (ref 3.5–5.1)
Sodium: 138 mEq/L (ref 135–145)

## 2022-03-17 LAB — CBC WITH DIFFERENTIAL/PLATELET
Basophils Absolute: 0.1 10*3/uL (ref 0.0–0.1)
Basophils Relative: 1 % (ref 0.0–3.0)
Eosinophils Absolute: 0.3 10*3/uL (ref 0.0–0.7)
Eosinophils Relative: 4.8 % (ref 0.0–5.0)
HCT: 34.4 % — ABNORMAL LOW (ref 39.0–52.0)
Hemoglobin: 11.1 g/dL — ABNORMAL LOW (ref 13.0–17.0)
Lymphocytes Relative: 27.3 % (ref 12.0–46.0)
Lymphs Abs: 1.6 10*3/uL (ref 0.7–4.0)
MCHC: 32.3 g/dL (ref 30.0–36.0)
MCV: 85.5 fl (ref 78.0–100.0)
Monocytes Absolute: 0.6 10*3/uL (ref 0.1–1.0)
Monocytes Relative: 10.5 % (ref 3.0–12.0)
Neutro Abs: 3.3 10*3/uL (ref 1.4–7.7)
Neutrophils Relative %: 56.4 % (ref 43.0–77.0)
Platelets: 160 10*3/uL (ref 150.0–400.0)
RBC: 4.02 Mil/uL — ABNORMAL LOW (ref 4.22–5.81)
RDW: 14.7 % (ref 11.5–15.5)
WBC: 5.8 10*3/uL (ref 4.0–10.5)

## 2022-03-17 LAB — HEMOGLOBIN A1C: Hgb A1c MFr Bld: 7.3 % — ABNORMAL HIGH (ref 4.6–6.5)

## 2022-03-17 LAB — IBC + FERRITIN
Ferritin: 65.4 ng/mL (ref 22.0–322.0)
Iron: 71 ug/dL (ref 42–165)
Saturation Ratios: 29.7 % (ref 20.0–50.0)
TIBC: 239.4 ug/dL — ABNORMAL LOW (ref 250.0–450.0)
Transferrin: 171 mg/dL — ABNORMAL LOW (ref 212.0–360.0)

## 2022-03-17 NOTE — Patient Instructions (Signed)
                                                                    [email or web address]                       

## 2022-03-17 NOTE — Progress Notes (Signed)
Subjective:  Patient ID: Frank [name], male    DOB: 1940/08/07  Age: 81 y.o. MRN: [id number]  CC: Anemia, Hypertension, and Diabetes   HPI Mercedes Valeriano presents for f/up -  He has no UTI symptoms and denies fever or chills.  His appetite is good.  Outpatient Medications Prior to Visit  Medication Sig Dispense Refill   atorvastatin (LIPITOR) 10 MG tablet TAKE ONE-HALF TABLET BY MOUTH  DAILY 50 tablet 2   BRILINTA 90 MG TABS tablet Take 1/2 (one-half) tablet by mouth twice daily 180 tablet 0   Ferric Maltol (ACCRUFER) 30 MG CAPS Take 1 capsule by mouth in the morning and at bedtime. 180 capsule 1   FLUoxetine (PROZAC) 20 MG capsule TAKE 1 CAPSULE BY MOUTH  DAILY 90 capsule 3   FLUoxetine (PROZAC) 40 MG capsule Take 40 mg by mouth daily.     insulin glargine, 2 Unit Dial, (TOUJEO MAX SOLOSTAR) 300 UNIT/ML Solostar Pen Inject 10 Units into the skin daily. 6 mL 0   Insulin Pen Needle 32G X 6 MM MISC 1 Act by Does not apply route daily. 100 each 1   lactose free nutrition (BOOST) LIQD Take 237 mLs by mouth 2 (two) times daily between meals.     levETIRAcetam (KEPPRA) 500 MG tablet Take 1 tablet by mouth twice daily 180 tablet 0   methylphenidate (METADATE CD) 10 MG CR capsule Take 1 capsule (10 mg total) by mouth every morning. 90 capsule 0   mineral oil-hydrophilic petrolatum (AQUAPHOR) ointment Apply 1 application topically as needed for dry skin.     PRESCRIPTION MEDICATION CPAP- At bedtime     No facility-administered medications prior to visit.    ROS Review of Systems  Constitutional:  Positive for fatigue. Negative for chills, diaphoresis and unexpected weight change.  HENT: Negative.    Eyes: Negative.   Respiratory:  Negative for cough, chest tightness, shortness of breath and wheezing.   Cardiovascular:  Negative for chest pain, palpitations and leg swelling.  Gastrointestinal:  Negative for abdominal pain, diarrhea and nausea.  Endocrine: Negative.    Genitourinary: Negative.  Negative for difficulty urinating.  Musculoskeletal: Negative.   Neurological: Negative.  Negative for dizziness, weakness, light-headedness and headaches.  Hematological:  Negative for adenopathy. Does not bruise/bleed easily.  Psychiatric/Behavioral: Negative.      Objective:  BP 120/62 (BP Location: Left Arm, Patient Position: Sitting, Cuff Size: Normal)   Pulse 71   Temp 97.7 F (36.5 C) (Oral)   Wt 175 lb (79.4 kg)   SpO2 94%   BMI 25.11 kg/m   BP Readings from Last 3 Encounters:  03/17/22 120/62  03/12/22 128/72  01/21/22 (!) 143/79    Wt Readings from Last 3 Encounters:  03/17/22 175 lb (79.4 kg)  12/15/21 176 lb (79.8 kg)  10/30/21 178 lb (80.7 kg)    Physical Exam Constitutional:      General: He is not in acute distress.    Appearance: He is not toxic-appearing or diaphoretic.  HENT:     Nose: Nose normal.     Mouth/Throat:     Mouth: Mucous membranes are moist.  Eyes:     General: No scleral icterus.    Conjunctiva/sclera: Conjunctivae normal.  Cardiovascular:     Rate and Rhythm: Normal rate and regular rhythm.     Heart sounds: No murmur heard. Pulmonary:     Effort: Pulmonary effort is normal.     Breath sounds: No stridor. No wheezing, rhonchi  or rales.  Abdominal:     General: Abdomen is flat.     Palpations: There is no mass.     Tenderness: There is no abdominal tenderness. There is no guarding.     Hernia: No hernia is present.  Musculoskeletal:        General: Normal range of motion.     Cervical back: Neck supple.     Right lower leg: No edema.  Lymphadenopathy:     Cervical: No cervical adenopathy.  Skin:    General: Skin is warm.     Coloration: Skin is pale.  Neurological:     General: No focal deficit present.     Mental Status: He is alert.  Psychiatric:        Mood and Affect: Mood normal.        Behavior: Behavior normal.     Lab Results  Component Value Date   WBC 5.8 03/17/2022   HGB 11.1  (L) 03/17/2022   HCT 34.4 (L) 03/17/2022   PLT 160.0 03/17/2022   GLUCOSE 153 (H) 03/17/2022   CHOL 104 11/19/2020   TRIG 91 11/19/2020   HDL 33 (L) 11/19/2020   LDLCALC 53 11/19/2020   ALT 8 10/30/2021   AST 11 (L) 10/30/2021   NA 138 03/17/2022   K 3.9 03/17/2022   CL 101 03/17/2022   CREATININE 1.31 03/17/2022   BUN 24 (H) 03/17/2022   CO2 32 03/17/2022   TSH 0.459 11/18/2020   PSA 1.08 07/10/2015   INR 0.9 03/30/2021   HGBA1C 7.3 (H) 03/17/2022   MICROALBUR 65.2 (H) 02/17/2022    No results found.  Assessment & Plan:   Aneudy was seen today for anemia, hypertension and diabetes.  Diagnoses and all orders for this visit:  Essential hypertension- His blood pressure is well controlled. -     Basic metabolic panel; Future -     Basic metabolic panel  Type 2 diabetes mellitus with peripheral neuropathy ([place])- His blood sugar is adequately well controlled. -     Basic metabolic panel; Future -     Hemoglobin A1c; Future -     Basic metabolic panel -     Hemoglobin A1c  Stage 3b chronic kidney disease ([place])-  -     Basic metabolic pane l; Future -     Basic metabolic panel  Iron deficiency anemia secondary to inadequate dietary iron intake -     IBC + Ferritin; Future -     CBC with Differential/Platelet; Future -     IBC + Ferritin -     CBC with Differential/Platelet  Vitamin B12 deficiency anemia due to intrinsic factor deficiency -     CBC with Differential/Platelet; Future -     CBC with Differential/Platelet   I am having [name] Demetra Shiner" maintain his Insulin Pen Needle, PRESCRIPTION MEDICATION, lactose free nutrition, mineral oil-hydrophilic petrolatum, ACCRUFeR, FLUoxetine, Toujeo Max SoloStar, methylphenidate, atorvastatin, levETIRAcetam, Brilinta, and FLUoxetine.  No orders of the defined types were placed in this encounter.    Follow-up: No follow-ups on file.  Scarlette Calico, MD

## 2022-03-18 ENCOUNTER — Encounter: Payer: Self-pay | Admitting: Internal Medicine

## 2022-03-20 NOTE — Telephone Encounter (Signed)
I have contacted [employer] to inquire about lot numbers. I hae documented in the immunization tab.

## 2022-03-30 ENCOUNTER — Ambulatory Visit: Payer: Medicare Other

## 2022-03-31 ENCOUNTER — Telehealth: Payer: Self-pay

## 2022-03-31 ENCOUNTER — Other Ambulatory Visit: Payer: Self-pay | Admitting: Internal Medicine

## 2022-03-31 DIAGNOSIS — E1142 Type 2 diabetes mellitus with diabetic polyneuropathy: Secondary | ICD-10-CM

## 2022-03-31 NOTE — Telephone Encounter (Signed)
1242- Palliative Care Check In    No answer. Left voice mail.  Attempt #1

## 2022-04-01 ENCOUNTER — Ambulatory Visit (INDEPENDENT_AMBULATORY_CARE_PROVIDER_SITE_OTHER): Payer: Medicare Other

## 2022-04-01 DIAGNOSIS — D510 Vitamin B12 deficiency anemia due to intrinsic factor deficiency: Secondary | ICD-10-CM | POA: Diagnosis not present

## 2022-04-01 MED ORDER — CYANOCOBALAMIN 1000 MCG/ML IJ SOLN
1000.0000 ug | Freq: Once | INTRAMUSCULAR | Status: AC
  Administered 2022-04-01: 1000 ug via INTRAMUSCULAR

## 2022-04-01 NOTE — Progress Notes (Signed)
After obtaining consent, and per orders of Dr. Jones, injection of B12 given in the left deltoid by Jaimie Redditt P Janiah Devinney. Patient instructed to report any adverse reaction to me immediately.  

## 2022-04-03 ENCOUNTER — Telehealth: Payer: Self-pay | Admitting: Internal Medicine

## 2022-04-03 ENCOUNTER — Encounter: Payer: Self-pay | Admitting: Internal Medicine

## 2022-04-03 NOTE — Telephone Encounter (Signed)
Patient's wife called stating that they have seen blood in patient's urine and she wants to know if they should come in for a urinalysis. Call back number is [phone]

## 2022-04-04 ENCOUNTER — Other Ambulatory Visit: Payer: Self-pay | Admitting: Internal Medicine

## 2022-04-04 ENCOUNTER — Encounter: Payer: Self-pay | Admitting: Internal Medicine

## 2022-04-04 DIAGNOSIS — N3001 Acute cystitis with hematuria: Secondary | ICD-10-CM

## 2022-04-04 MED ORDER — CIPROFLOXACIN HCL 250 MG PO TABS: 250.0000 mg | ORAL_TABLET | Freq: Two times a day (BID) | ORAL | 0 refills | Status: AC

## 2022-04-06 NOTE — Telephone Encounter (Signed)
Patient was notified.

## 2022-04-07 DIAGNOSIS — E119 Type 2 diabetes mellitus without complications: Secondary | ICD-10-CM | POA: Diagnosis not present

## 2022-04-07 DIAGNOSIS — H524 Presbyopia: Secondary | ICD-10-CM | POA: Diagnosis not present

## 2022-04-07 LAB — HM DIABETES EYE EXAM

## 2022-04-08 DIAGNOSIS — R339 Retention of urine, unspecified: Secondary | ICD-10-CM | POA: Diagnosis not present

## 2022-04-08 DIAGNOSIS — Z466 Encounter for fitting and adjustment of urinary device: Secondary | ICD-10-CM | POA: Diagnosis not present

## 2022-04-09 ENCOUNTER — Telehealth: Payer: Self-pay

## 2022-04-09 ENCOUNTER — Encounter: Payer: Self-pay | Admitting: Family Medicine

## 2022-04-09 ENCOUNTER — Other Ambulatory Visit: Payer: Medicare Other | Admitting: Family Medicine

## 2022-04-09 VITALS — BP 136/78 | HR 74 | Resp 18

## 2022-04-09 DIAGNOSIS — R531 Weakness: Secondary | ICD-10-CM | POA: Diagnosis not present

## 2022-04-09 DIAGNOSIS — E11649 Type 2 diabetes mellitus with hypoglycemia without coma: Secondary | ICD-10-CM

## 2022-04-09 DIAGNOSIS — Z515 Encounter for palliative care: Secondary | ICD-10-CM | POA: Diagnosis not present

## 2022-04-09 NOTE — Telephone Encounter (Signed)
Verbal order given to enroll pt into palliative care.

## 2022-04-09 NOTE — Telephone Encounter (Signed)
Manus Gunning is calling from [facility] wanting to know if Dr.Jones will give a verbal order to enroll Mr.Eisenburg in palliative care.

## 2022-04-09 NOTE — Progress Notes (Signed)
[facility] Consult Note Telephone: [phone]  Fax: [phone]    Date of encounter: 04/09/2022 12:15 PM  PATIENT NAME: Frank [name] [address] [place] [address]   [phone] (home)  DOB: [birth year] MRN: [id number] PRIMARY CARE PROVIDER:    Janith Lima, MD,  [place] [address] [phone]  REFERRING PROVIDER:   Janith Lima, MD [place],  [place] [address] [phone]  RESPONSIBLE PARTY:    Contact Information     Name Relation Home Work Mobile   Frank [name] Spouse [phone]  [phone]   Frank [name], Frank [name]   [phone]   Frank [name] Daughter   [phone]        I met face to face with patient, wife in his home. Palliative Care was asked to follow this patient by consultation request of  Frank Lima, MD to address advance care planning and complex medical decision making. This is a follow up visit   ASSESSMENT, SYMPTOM MANAGEMENT AND PLAN / RECOMMENDATIONS:  Hypoglycemia with Type 2 DM DM well controlled, occasional blood sugar 70s-90s, none less than 70. Continue Toujeo 8 units daily. Advised to contact PCP if having sugars < 70s.  2.  Generalized weakness Pt working with PT, able to ambulate some and go up and down steps Advised pt if he wants to walk more that he has to do PT exercises when PT not visiting. Advised pt if he does not continue to move he will lose function and likely have increased pain when trying to move. Encouraged pt to share with wife and family if he does not want to do exercise or wants to but lacks energy to complete.  3..  Palliative Care Encounter Pt has updated vaccinations for newest Covid strain, pneumonia and RSV. Encouraged wife that she was doing all she could to support and encourage pt to regain function.   CODE STATUS: MOST as of 11/20/20: DNR/DNI with limited additional intervention Use of  antibiotics and IV fluids if indicated  No feeding tube.   Follow up Palliative Care Visit: Palliative care will continue to follow for complex medical decision making, advance care planning, and clarification of goals. Return visit to be scheduled by nursing in 4-5 weeks.  This visit was coded based on medical decision making (MDM).  PPS: 50%  HOSPICE ELIGIBILITY/DIAGNOSIS: TBD  Chief Complaint:  Palliative Care is following for chronic medical management in setting of dementia with prior CVA.  HISTORY OF PRESENT ILLNESS:  Frank [name] is a 81 y.o. 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    Pt takes Toujeo 8 units nightly. Blood sugars are running 72-130s. 3 isolated lows between 70s-90s.   Denies CP, SOB. Currently on Cipro BID for UTI.  Has had 1 fall recently without injury.  Not doing exercise when PT not present but states "I want to walk more".  History obtained from review of EMR, interview with family, caregiver and/or Frank [name].      Latest Ref Rng & Units 03/17/2022    1:47 PM 12/15/2021    1:51 PM 10/30/2021   12:34 PM  CBC  WBC 4.0 -  10.5 K/uL 5.8  8.0  6.4   Hemoglobin 13.0 - 17.0 g/dL 11.1  12.3  11.7   Hematocrit 39.0 - 52.0 % 34.4  37.6  37.4   Platelets 150.0 - 400.0 K/uL 160.0  148.0  134        Latest Ref Rng & Units 03/17/2022    1:47 PM 12/15/2021    1:51 PM 10/30/2021   12:34 PM  CMP  Glucose 70 - 99 mg/dL 153  124  98   BUN 6 - 23 mg/dL 24  30  33   Creatinine 0.40 - 1.50 mg/dL 1.31  1.53  1.52   Sodium 135 - 145 mEq/L 138  140  138   Potassium 3.5 - 5.1 mEq/L 3.9  4.5  3.9   Chloride 96 - 112 mEq/L 101  104  102    CO2 19 - 32 mEq/L 32  30  29   Calcium 8.4 - 10.5 mg/dL 8.7  9.3  9.2   Total Protein 6.5 - 8.1 g/dL   6.0   Total Bilirubin 0.3 - 1.2 mg/dL   0.4   Alkaline Phos 38 - 126 U/L   45   AST 15 - 41 U/L   11   ALT 0 - 44 U/L   8     03/17/22 Last HGB A1c  7.3%  I reviewed EMR for new available labs, imaging, studies and related documents since last visit.  ROS General: NAD ENMT: denies dysphagia Cardiovascular: denies chest pain, denies DOE Pulmonary: denies cough, denies increased SOB Abdomen: endorses good appetite, denies constipation, endorses continence of bowel GU: suprapubic cath to gravity drainage  MSK: single fall reported, improved strength but has some pain in joints when first getting OOB Neurological: denies insomnia Psych: bright on approach, irritable with wife making suggestions Heme/lymph/immuno: denies bruises, abnormal bleeding  Physical Exam: Current and past weights 175 as of 03/17/2022, 178 lbs in April 2023 Constitutional: NAD General: WN and WD CV: S1S2, RRR with LUSB murmur, no LE edema Pulmonary: CTAB, no increased work of breathing, no cough, room air.  Abdomen: normo-active BS + 4 quadrants, soft and non tender GU: SP cath in place to gravity drainage  MSK: moves all extremities Neuro:  no generalized weakness,  noted short term memory deficits Psych: labile with irritability with wife, bright on approach with provider   Thank you for the opportunity to participate in the care of Frank [name].  The palliative care team will continue to follow. Please call our office at [phone] if we can be of additional assistance.   Frank Conception, FNP -C  COVID-19 PATIENT SCREENING TOOL Asked and negative response unless otherwise noted:   Have you had symptoms of covid, tested positive or been in contact with someone with symptoms/positive test in the past 5-10 days? No

## 2022-04-10 ENCOUNTER — Other Ambulatory Visit: Payer: Self-pay | Admitting: Internal Medicine

## 2022-04-22 ENCOUNTER — Telehealth (HOSPITAL_COMMUNITY): Payer: Self-pay

## 2022-04-22 NOTE — Telephone Encounter (Signed)
Called to schedule us carotid, no answer, left vm. AW  

## 2022-04-22 NOTE — Telephone Encounter (Signed)
Called to schedule f/u US carotid. They don't want to do any f/u's at this time. They will call if this changes. AW

## 2022-04-30 ENCOUNTER — Telehealth: Payer: Self-pay | Admitting: Internal Medicine

## 2022-04-30 NOTE — Telephone Encounter (Signed)
Left message for patient to call back and schedule Medicare Annual Wellness Visit (AWV).   Please offer to do virtually or by telephone.  Left office number and my jabber [phone].  Last AWV:11/08/2019  Please schedule at anytime with Nurse Health Advisor.

## 2022-05-01 ENCOUNTER — Ambulatory Visit (INDEPENDENT_AMBULATORY_CARE_PROVIDER_SITE_OTHER): Payer: Medicare Other | Admitting: *Deleted

## 2022-05-01 DIAGNOSIS — D510 Vitamin B12 deficiency anemia due to intrinsic factor deficiency: Secondary | ICD-10-CM

## 2022-05-01 MED ORDER — CYANOCOBALAMIN 1000 MCG/ML IJ SOLN
1000.0000 ug | Freq: Once | INTRAMUSCULAR | Status: AC
  Administered 2022-05-01: 1000 ug via INTRAMUSCULAR

## 2022-05-01 NOTE — Progress Notes (Signed)
Administered B12 1000 mcg/ml left deltoid. Pt tolerated well. 

## 2022-05-04 ENCOUNTER — Telehealth: Payer: Self-pay

## 2022-05-04 ENCOUNTER — Other Ambulatory Visit: Payer: Self-pay | Admitting: Internal Medicine

## 2022-05-04 DIAGNOSIS — N390 Urinary tract infection, site not specified: Secondary | ICD-10-CM | POA: Diagnosis not present

## 2022-05-04 DIAGNOSIS — R300 Dysuria: Secondary | ICD-10-CM | POA: Diagnosis not present

## 2022-05-04 NOTE — Telephone Encounter (Signed)
Spoke to Hailey(nurse)- stated collect the urine and will [facility] then forward the results to the office.

## 2022-05-04 NOTE — Telephone Encounter (Signed)
Please advise 

## 2022-05-04 NOTE — Telephone Encounter (Signed)
Tammy from [facility] said she received a call from [name]'s wife. [name] believes that [name] has another UTI, cloudly urine, sleeping more, and irritability. They want to know if they can get an order so they can go by and collect a urine sample from the patient.

## 2022-05-05 ENCOUNTER — Telehealth: Payer: Self-pay | Admitting: Internal Medicine

## 2022-05-05 NOTE — Telephone Encounter (Signed)
Spouse called to report urine collected yesterday at [facility] resulted bacteria in urine. Spouse is requesting antibiotic RX to be sent to [facility] on [address].

## 2022-05-06 ENCOUNTER — Encounter: Payer: Self-pay | Admitting: Internal Medicine

## 2022-05-06 ENCOUNTER — Other Ambulatory Visit: Payer: Self-pay | Admitting: Internal Medicine

## 2022-05-06 DIAGNOSIS — N390 Urinary tract infection, site not specified: Secondary | ICD-10-CM

## 2022-05-06 DIAGNOSIS — R338 Other retention of urine: Secondary | ICD-10-CM | POA: Diagnosis not present

## 2022-05-06 DIAGNOSIS — Z435 Encounter for attention to cystostomy: Secondary | ICD-10-CM | POA: Diagnosis not present

## 2022-05-06 MED ORDER — CIPROFLOXACIN HCL 250 MG PO TABS: 250.0000 mg | ORAL_TABLET | Freq: Two times a day (BID) | ORAL | 0 refills | Status: AC

## 2022-05-06 NOTE — Telephone Encounter (Signed)
Patient is wanting an antibiotic called in today if at possible for his urinary tract infection

## 2022-05-08 ENCOUNTER — Telehealth: Payer: Self-pay | Admitting: Internal Medicine

## 2022-05-08 ENCOUNTER — Other Ambulatory Visit: Payer: Self-pay | Admitting: Internal Medicine

## 2022-05-08 DIAGNOSIS — N3000 Acute cystitis without hematuria: Secondary | ICD-10-CM

## 2022-05-08 MED ORDER — CEFPODOXIME PROXETIL 200 MG PO TABS: 200.0000 mg | ORAL_TABLET | Freq: Two times a day (BID) | ORAL | 0 refills | Status: AC

## 2022-05-08 NOTE — Telephone Encounter (Signed)
Will with [facility] calls today regarding PT's recent RX of Cipro. PT was prescribed Cipro as a results of preliminary results from urinalysis. The final report for the urinalysis was received today and the infection that PT has is actually resistant to the Cipro. They are requesting another antibiotic be chosen due to these new findings. I had confirmed with Will that we received the results, and he leaves his number incase there is anything we would need to go over with him.  CB for Will: [phone]  When chosen they are requesting this RX to be sent to the [facility] on file [place], [address])

## 2022-05-13 ENCOUNTER — Ambulatory Visit (INDEPENDENT_AMBULATORY_CARE_PROVIDER_SITE_OTHER): Payer: Medicare Other

## 2022-05-13 DIAGNOSIS — I441 Atrioventricular block, second degree: Secondary | ICD-10-CM | POA: Diagnosis not present

## 2022-05-13 LAB — CUP PACEART REMOTE DEVICE CHECK
Battery Remaining Longevity: 130 mo
Battery Voltage: 3.02 V
Brady Statistic AP VP Percent: 0.76 %
Brady Statistic AP VS Percent: 0 %
Brady Statistic AS VP Percent: 99.15 %
Brady Statistic AS VS Percent: 0.09 %
Brady Statistic RA Percent Paced: 0.77 %
Brady Statistic RV Percent Paced: 99.91 %
Date Time Interrogation Session: 20231101015025
Implantable Lead Connection Status: 753985
Implantable Lead Connection Status: 753985
Implantable Lead Implant Date: 20211102
Implantable Lead Implant Date: 20211102
Implantable Lead Location: 753859
Implantable Lead Location: 753860
Implantable Lead Model: 3830
Implantable Lead Model: 5076
Implantable Pulse Generator Implant Date: 20211102
Lead Channel Impedance Value: 342 Ohm
Lead Channel Impedance Value: 418 Ohm
Lead Channel Impedance Value: 475 Ohm
Lead Channel Impedance Value: 513 Ohm
Lead Channel Pacing Threshold Amplitude: 0.5 V
Lead Channel Pacing Threshold Amplitude: 0.875 V
Lead Channel Pacing Threshold Pulse Width: 0.4 ms
Lead Channel Pacing Threshold Pulse Width: 0.4 ms
Lead Channel Sensing Intrinsic Amplitude: 3.5 mV
Lead Channel Sensing Intrinsic Amplitude: 3.5 mV
Lead Channel Sensing Intrinsic Amplitude: 31.625 mV
Lead Channel Sensing Intrinsic Amplitude: 31.625 mV
Lead Channel Setting Pacing Amplitude: 1.5 V
Lead Channel Setting Pacing Amplitude: 2 V
Lead Channel Setting Pacing Pulse Width: 0.4 ms
Lead Channel Setting Sensing Sensitivity: 1.2 mV
Zone Setting Status: 755011

## 2022-05-15 ENCOUNTER — Telehealth: Payer: Self-pay | Admitting: Internal Medicine

## 2022-05-15 DIAGNOSIS — D508 Other iron deficiency anemias: Secondary | ICD-10-CM

## 2022-05-15 MED ORDER — ACCRUFER 30 MG PO CAPS: 1.0000 | ORAL_CAPSULE | Freq: Two times a day (BID) | ORAL | 1 refills | Status: AC

## 2022-05-15 NOTE — Telephone Encounter (Signed)
ACCRUFER 30 MG capsule. Take 1 per day.  3 MONTH SUPPLY  [facility] [place].  [address], [place], [place] [address] Phone [phone]  Per call from patient's wife, they need new RX, no more refills

## 2022-05-21 NOTE — Progress Notes (Signed)
Subjective:   Frank [name] is a 81 y.o. male who presents for Medicare Annual/Subsequent preventive examination. I connected with  [name] on 05/22/22 by a video and audio enabled telemedicine application and verified that I am speaking with the correct person using two identifiers.  Patient Location: Home  Provider Location: Home Office  I discussed the limitations of evaluation and management by telemedicine. The patient expressed understanding and agreed to proceed.  Review of Systems    Deferred to PCP Cardiac Risk Factors include: advanced age (>72mn, >>51women)     Objective:    There were no vitals filed for this visit. There is no height or weight on file to calculate BMI.     05/22/2022   10:27 AM 10/30/2021   12:01 PM 09/28/2021    4:35 PM 05/19/2021    9:26 AM 04/19/2021   12:02 PM 03/31/2021    2:00 PM 01/06/2021   11:06 AM  Advanced Directives  Does Patient Have a Medical Advance Directive? Yes Yes No No No No Yes  Type of [place]of [place];Out of facility DNR (pink MOST or yellow form) Healthcare Power of [place] (pink MOST or yellow form);Living will  Does patient want to make changes to medical advance directive? No - Patient declined        Copy of HStaffordin Chart? No - copy requested        Would patient like information on creating a medical advance directive?   No - Patient declined No - Patient declined No - Patient declined No - Patient declined   Pre-existing out of facility DNR order (yellow form or pink MOST form) Pink Most/Yellow Form available - Physician notified to receive inpatient order          Current Medications (verified) Outpatient Encounter Medications as of 05/22/2022  Medication Sig   atorvastatin (LIPITOR) 10 MG tablet TAKE ONE-HALF TABLET BY MOUTH  DAILY   BRILINTA 90 MG TABS tablet Take 1/2  (one-half) tablet by mouth twice daily   Ferric Maltol (ACCRUFER) 30 MG CAPS Take 1 capsule by mouth in the morning and at bedtime.   FLUoxetine (PROZAC) 40 MG capsule Take 40 mg by mouth daily.   Insulin Pen Needle 32G X 6 MM MISC 1 Act by Does not apply route daily.   lactose free nutrition (BOOST) LIQD Take 237 mLs by mouth 2 (two) times daily between meals.   levETIRAcetam (KEPPRA) 500 MG tablet Take 1 tablet by mouth twice daily   methylphenidate (METADATE CD) 10 MG CR capsule Take 1 capsule (10 mg total) by mouth every morning.   mineral oil-hydrophilic petrolatum (AQUAPHOR) ointment Apply 1 application topically as needed for dry skin.   PRESCRIPTION MEDICATION CPAP- At bedtime   TOUJEO SOLOSTAR 300 UNIT/ML Solostar Pen INJECT SUBCUTANEOUSLY 10 UNITS  DAILY   FLUoxetine (PROZAC) 20 MG capsule TAKE 1 CAPSULE BY MOUTH  DAILY (Patient not taking: Reported on 05/22/2022)   No facility-administered encounter medications on file as of 05/22/2022.    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, Ischemic optic   OSA (obstructive sleep apnea)    cpap   Presence of permanent cardiac pacemaker    Sepsis secondary to UTI ([place]) 05/19/2021   TBI (traumatic brain injury) [facility])    Past Surgical History:  Procedure Laterality Date   APPENDECTOMY     arm fracture Left 2011   with hardware   BURR HOLE Bilateral 08/04/2017   Procedure: Haskell Flirt;  Surgeon: [name], MD;  Location: [place];  Service: Neurosurgery;  Laterality: Bilateral;   COLONOSCOPY     CRANIOTOMY Left  12/01/2017   Procedure: Trudee Kuster Holes CRANIOTOMY HEMATOMA EVACUATION SUBDURAL;  Surgeon: [name], MD;  Location: [place];  Service: Neurosurgery;  Laterality: Left;   ESOPHAGOGASTRODUODENOSCOPY  2006   gastritis   IR ANGIO INTRA EXTRACRAN SEL COM CAROTID INNOMINATE UNI L MOD SED  03/31/2021   IR ANGIO VERTEBRAL SEL SUBCLAVIAN INNOMINATE UNI R MOD SED  03/31/2021   IR CT HEAD LTD  03/31/2021   IR INTRAVSC STENT CERV CAROTID W/EMB-PROT MOD SED INCL ANGIO  03/31/2021   IR PERCUTANEOUS ART THROMBECTOMY/INFUSION INTRACRANIAL INC DIAG ANGIO  09/09/2020   IR RADIOLOGIST EVAL & MGMT  03/10/2021   IR RADIOLOGIST EVAL & MGMT  04/28/2021   PACEMAKER IMPLANT N/A 05/14/2020   Procedure: PACEMAKER IMPLANT;  Surgeon: [name], MD;  Location: [place] [facility];  Service: Cardiovascular;  Laterality: N/A;   RADIOLOGY WITH ANESTHESIA N/A 09/09/2020   Procedure: IR WITH ANESTHESIA;  Surgeon: Radiologist, Medication, MD;  Location: [place];  Service: Radiology;  Laterality: N/A;   RADIOLOGY WITH ANESTHESIA N/A 03/31/2021   Procedure: IR WITH ANESTHESIA STENT PLACEMENT;  Surgeon: Luanne Bras, MD;  Location: [place];  Service: Radiology;  Laterality: N/A;   ROTATOR CUFF REPAIR     SEPTOPLASTY  1959   Deviated Septum   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   Family History  Problem Relation Age of Onset   Heart disease Mother    Breast cancer Mother    Stroke Father    Allergies Father        Father and children   Coronary artery disease Brother    Diabetes Neg Hx    Colon cancer Neg Hx    Social History   Socioeconomic History   Marital status: Married    Spouse name: Not on file   Number of children: 1   Years of education: Not on file   Highest education level: Not on file  Occupational History   Not on file  Tobacco Use   Smoking status: Former    Packs/day: 1.00    Types: Cigarettes    Quit date: 07/14/1983    Years since quitting: 38.8   Smokeless tobacco: Never  Vaping Use   Vaping Use:  Never used  Substance and Sexual Activity   Alcohol use: Yes    Alcohol/week: 2.0 standard drinks of alcohol    Types: 2 Standard drinks or equivalent per week    Comment: occassionally   Drug use: No   Sexual activity: Never  Other Topics Concern   Not on file  Social History Narrative   Right handed    Lives with wife   Has a caregiver    Social Determinants of Health   Financial Resource Strain: Low Risk  (05/18/2022)  Overall Financial Resource Strain (CARDIA)    Difficulty of Paying Living Expenses: Not very hard  Food Insecurity: Unknown (05/18/2022)   Hunger Vital Sign    Worried About Running Out of Food in the Last Year: Never true    Ran Out of Food in the Last Year: Not on file  Transportation Needs: No Transportation Needs (05/18/2022)   PRAPARE - Hydrologist (Medical): No    Lack of Transportation (Non-Medical): No  Physical Activity: Insufficiently Active (05/18/2022)   Exercise Vital Sign    Days of Exercise per Week: 2 days    Minutes of Exercise per Session: 20 min  Stress: No Stress Concern Present (05/18/2022)   [place]    Feeling of Stress : Not at all  Social Connections: Moderately Integrated (05/18/2022)   Social Connection and Isolation Panel [NHANES]    Frequency of Communication with Friends and Family: More than three times a week    Frequency of Social Gatherings with Friends and Family: Three times a week    Attends Religious Services: Never    Active Member of Clubs or Organizations: Yes    Attends Music therapist: More than 4 times per year    Marital Status: Married    Tobacco Counseling Counseling given: Not Answered   Clinical Intake:  Pre-visit preparation completed: Yes  Pain : No/denies pain     Nutritional Status: BMI 25 -29 Overweight Nutritional Risks: Other (Comment) (patient has a poor appetite; drinks Boost to  supplement) Diabetes: No  How often do you need to have someone help you when you read instructions, pamphlets, or other written materials from your doctor or pharmacy?: 5 - Always What is the last grade level you completed in school?: memory issues; spouse is caregiver  Diabetic?Yes Nutrition Risk Assessment:  Has the patient had any N/V/D within the last 2 months?  No  Does the patient have any non-healing wounds?  No  Has the patient had any unintentional weight loss or weight gain?  No   Diabetes:  Is the patient diabetic?  Yes  If diabetic, was a CBG obtained today?  No  Did the patient bring in their glucometer from home?  No  How often do you monitor your CBG's? daily.   Financial Strains and Diabetes Management:  Are you having any financial strains with the device, your supplies or your medication? Yes .  Does the patient want to be seen by Chronic Care Management for management of their diabetes?  No  Would the patient like to be referred to a Nutritionist or for Diabetic Management?  No   Diabetic Exams:  Diabetic Eye Exam: Completed 04/07/22 Diabetic Foot Exam: Overdue, Pt has been advised about the importance in completing this exam. Pt is scheduled for diabetic foot exam on deferred to PCP.  Interpreter Needed?: No  Information entered by :: Emelia Loron RN   Activities of Daily Living    05/22/2022   10:20 AM 05/18/2022   10:05 AM  In your present state of health, do you have any difficulty performing the following activities:  Hearing? 0 0  Vision? 0 0  Difficulty concentrating or making decisions? 1 1  Comment Hx. of TBI   Walking or climbing stairs? 1 1  Comment wheelchair bound; has aide and family to assist   Dressing or bathing? 1 1  Comment wheelchair bound; has aide and family to assist  Doing errands, shopping? 1 1  Comment family Diplomatic Services operational officer and eating ? Y Y  Comment wheelchair bound; has aide and family to assist   Using the  Toilet? N N  In the past six months, have you accidently leaked urine? Y Y  Comment suprapubic cathetheter   Do you have problems with loss of bowel control? N N  Managing your Medications? Y N  Comment spouse assist   Managing your Finances? Tempie Donning  Comment spouse assist   Housekeeping or managing your Housekeeping? Tempie Donning  Comment spouse assist     Patient Care Team: Janith Lima, MD as PCP - General (Internal Medicine) [name], MD as Consulting Physician (Neurosurgery) Charlton Haws, [facility] as Pharmacist (Pharmacist) Pieter Partridge, DO as Consulting Physician (Neurology)  Indicate any recent Medical Services you may have received from other than [facility] providers in the past year (date may be approximate).     Assessment:   This is a routine wellness examination for [place].  Hearing/Vision screen No results found.  Dietary issues and exercise activities discussed: Current Exercise Habits: Structured exercise class, Time (Minutes): 45, Frequency (Times/Week): 2, Weekly Exercise (Minutes/Week): 90, Intensity: Mild, Exercise limited by: orthopedic condition(s);neurologic condition(s);Other - see comments (stroke with residual)   Goals Addressed             This Visit's Progress    Patient Stated       Continue to work with PT/OT to maintain strength and mobility.      Depression Screen    05/22/2022   10:32 AM 03/17/2022    1:38 PM 12/15/2021    1:08 PM 11/06/2021    1:22 PM 10/17/2020    3:37 PM 05/31/2020    9:08 AM 05/09/2020    3:39 PM  PHQ 2/9 Scores  PHQ - 2 Score 0 0 0 2 0 0 0  PHQ- 9 Score  0 0 11 0 0     Fall Risk    05/18/2022   10:05 AM 03/17/2022    1:37 PM 12/15/2021    1:07 PM 11/06/2021    1:22 PM 01/06/2021   11:06 AM  Fall Risk   Falls in the past year? 1 0 '1 1 1  '$ Number falls in past yr: 1 0 '1 1 1  '$ Injury with Fall? 0 0 1 1 0  Risk for fall due to : History of fall(s);Impaired balance/gait;Impaired mobility No Fall Risks     Follow up Falls  evaluation completed Falls evaluation completed       [place]:  Any stairs in or around the home? No  If so, are there any without handrails? No  Home free of loose throw rugs in walkways, pet beds, electrical cords, etc? No  Adequate lighting in your home to reduce risk of falls? No   ASSISTIVE DEVICES UTILIZED TO PREVENT FALLS:  Life alert? No  Use of a cane, walker or w/c? Yes  Grab bars in the bathroom? Yes  Shower chair or bench in shower? Yes  Elevated toilet seat or a handicapped toilet? Yes   Cognitive Function:        05/22/2022   10:34 AM  6CIT Screen  What Year? 0 points  What month? 3 points  What time? 3 points  Count back from 20 0 points  Months in reverse 0 points  Repeat phrase 4 points  Total Score 10 points  Immunizations Immunization History  Administered Date(s) Administered   Fluad Quad(high Dose 65+) 03/27/2020, 04/01/2021   Influenza Split 05/06/2011, 03/23/2012   Influenza Whole 04/17/2009, 04/16/2010   Influenza, High Dose Seasonal PF 03/11/2016, 05/04/2017, 03/25/2018, 03/07/2019, 03/17/2022   Influenza,inj,Quad PF,6+ Mos 04/04/2013, 04/06/2014, 03/06/2015   PFIZER Comirnaty(Gray Top)Covid-19 Tri-Sucrose Vaccine 11/22/2020   PFIZER(Purple Top)SARS-COV-2 Vaccination 07/29/2019, 08/19/2019   Pfizer Covid-19 Vaccine Bivalent Booster 36yr & up 12/22/2021, 04/03/2022   Pneumococcal Conjugate-13 11/01/2013   Pneumococcal Polysaccharide-23 07/13/2001, 09/17/2010, 05/04/2017   Respiratory Syncytial Virus Vaccine,Recomb Aduvanted(Arexvy) 03/17/2022   Td 01/16/2010   Tdap 11/27/2019   Zoster Recombinat (Shingrix) 10/19/2017, 01/04/2018   Zoster, Live 05/06/2011    TDAP status: Up to date  Flu Vaccine status: Up to date  Pneumococcal vaccine status: Up to date  Covid-19 vaccine status: Information provided on how to obtain vaccines.   Qualifies for Shingles Vaccine? Yes   Zostavax completed No    Shingrix Completed?: Yes  Screening Tests Health Maintenance  Topic Date Due   COVID-19 Vaccine (6 - Pfizer risk series) 05/29/2022   OPHTHALMOLOGY EXAM  04/08/2023   TETANUS/TDAP  11/26/2029   Pneumonia Vaccine 81 Years old  Completed   INFLUENZA VACCINE  Completed   Zoster Vaccines- Shingrix  Completed   HPV VACCINES  Aged Out    Health Maintenance  There are no preventive care reminders to display for this patient.  Colorectal cancer screening: No longer required.   Lung Cancer Screening: (Low Dose CT Chest recommended if Age 83-80years, 30 pack-year currently smoking OR have quit w/in 15years.) does not qualify.   Additional Screening:  Hepatitis C Screening: does qualify; Completed education provided  Vision Screening: Recommended annual ophthalmology exams for early detection of glaucoma and other disorders of the eye. Is the patient up to date with their annual eye exam?  Yes  Who is the provider or what is the name of the office in which the patient attends annual eye exams? Dr. BValetta CloseIf pt is not established with a provider, would they like to be referred to a provider to establish care?  N/A .   Dental Screening: Recommended annual dental exams for proper oral hygiene  Community Resource Referral / Chronic Care Management: CRR required this visit?  No   CCM required this visit?  No      Plan:     I have personally reviewed and noted the following in the patient's chart:   Medical and social history Use of alcohol, tobacco or illicit drugs  Current medications and supplements including opioid prescriptions. Patient is not currently taking opioid prescriptions. Functional ability and status Nutritional status Physical activity Advanced directives List of other physicians Hospitalizations, surgeries, and ER visits in previous 12 months Vitals Screenings to include cognitive, depression, and falls Referrals and appointments  In addition, I have  reviewed and discussed with patient certain preventive protocols, quality metrics, and best practice recommendations. A written personalized care plan for preventive services as well as general preventive health recommendations were provided to patient.     JMichiel Cowboy RN   05/22/2022   Nurse Notes:  Mr. EEsco, Thank you for taking time to come for your Medicare Wellness Visit. I appreciate your ongoing commitment to your health goals. Please review the following plan we discussed and let me know if I can assist you in the future.   These are the goals we discussed:  Goals      Client understands the importance of  follow-up with providers by attending scheduled visits     Exercise 3x per week (60 min per time)     Walk in my neighborhood and/or go to the [facility] 3 times per week.      Patient Stated     Continue to gain strength physically by staying active, increase the amount of water I drink daily, and I want to start to drive again.      Patient Stated     To be around next year     Patient Stated     Continue to work with PT/OT to maintain strength and mobility.     Pharmacy Care Plan     CARE PLAN ENTRY  Current Barriers:  Chronic Disease Management support, education, and care coordination needs related to Hypertension, Hyperlipidemia, Diabetes, and GERD   Hypertension BP Readings from Last 3 Encounters:  12/12/19 128/62  12/09/19 139/64  11/28/19 (!) 136/58  Pharmacist Clinical Goal(s): Over the next 180 days, patient will work with PharmD and providers to maintain BP goal <130/80 Current regimen:  Doxazosin 4 mg - 1/2 tablet at bedtime Interventions: Discussed benefits of taking BP medications at night Patient self care activities - Over the next 180 days, patient will: Check BP as needed, document, and provide at future appointments Ensure daily salt intake < 2300 mg/day Move blood pressure medications to bedtime  Hyperlipidemia Lab Results  Component  Value Date/Time   LDLCALC 66 04/20/2019 11:02 AM  Pharmacist Clinical Goal(s): Over the next 180 days, patient will work with PharmD and providers to maintain LDL goal < 70 Current regimen:  Atorvastatin 80 mg daily Interventions: Discussed cholesterol goals and benefits of atorvastatin for prevention of heart attack and stroke Patient self care activities - Over the next 180 days, patient will: Continue medication as prescribed Continue low cholesterol diet  Diabetes Lab Results  Component Value Date/Time   HGBA1C 7.3 (H) 01/22/2020 10:39 AM   HGBA1C 6.9 (H) 11/27/2019 10:38 AM  Pharmacist Clinical Goal(s): Over the next 180 days, patient will work with PharmD and providers to maintain A1c goal <8% Current regimen:  Glipizide XL 10 mg daily Interventions: Discussed A1c goal and benefits of good blood sugar control Patient self care activities - Over the next 90 days, patient will: Contact provider with any episodes of hypoglycemia (low blood sugar) Continue medication as prescribed  GERD / Heartburn Pharmacist Clinical Goal(s) Over the next 180 days, patient will work with PharmD and providers to determine necessity of medications Current regimen:  No medications Interventions: Trial off pantoprazole was successful without return of significant GERD symptoms Patient self care activities - Over the next 180 days, patient will: If heartburn returns, may start back on pantoprazole at any time  Medication management Pharmacist Clinical Goal(s): Over the next 180 days, patient will work with PharmD and providers to maintain optimal medication adherence Current pharmacy: [facility] mail order Interventions Comprehensive medication review performed. Continue current medication management strategy Patient self care activities - Over the next 180 days, patient will: Focus on medication adherence by pill box Take medications as prescribed Report any questions or concerns to PharmD  and/or provider(s)  Please see past updates related to this goal by clicking on the "Past Updates" button in the selected goal         This is a list of the screening recommended for you and due dates:  Health Maintenance  Topic Date Due   COVID-19 Vaccine (6 - Pfizer risk series) 05/29/2022  Eye exam for diabetics  04/08/2023   Tetanus Vaccine  11/26/2029   Pneumonia Vaccine  Completed   Flu Shot  Completed   Zoster (Shingles) Vaccine  Completed   HPV Vaccine  Aged Out

## 2022-05-21 NOTE — Patient Instructions (Signed)
Health Maintenance, Male Adopting a healthy lifestyle and getting preventive care are important in promoting health and wellness. Ask your health care provider about: The right schedule for you to have regular tests and exams. Things you can do on your own to prevent diseases and keep yourself healthy. What should I know about diet, weight, and exercise? Eat a healthy diet  Eat a diet that includes plenty of vegetables, fruits, low-fat dairy products, and lean protein. Do not eat a lot of foods that are high in solid fats, added sugars, or sodium. Maintain a healthy weight Body mass index (BMI) is a measurement that can be used to identify possible weight problems. It estimates body fat based on height and weight. Your health care provider can help determine your BMI and help you achieve or maintain a healthy weight. Get regular exercise Get regular exercise. This is one of the most important things you can do for your health. Most adults should: Exercise for at least 150 minutes each week. The exercise should increase your heart rate and make you sweat (moderate-intensity exercise). Do strengthening exercises at least twice a week. This is in addition to the moderate-intensity exercise. Spend less time sitting. Even light physical activity can be beneficial. Watch cholesterol and blood lipids Have your blood tested for lipids and cholesterol at 83 years of age, then have this test every 5 years. You may need to have your cholesterol levels checked more often if: Your lipid or cholesterol levels are high. You are older than 83 years of age. You are at high risk for heart disease. What should I know about cancer screening? Many types of cancers can be detected early and may often be prevented. Depending on your health history and family history, you may need to have cancer screening at various ages. This may include screening for: Colorectal cancer. Prostate cancer. Skin cancer. Lung  cancer. What should I know about heart disease, diabetes, and high blood pressure? Blood pressure and heart disease High blood pressure causes heart disease and increases the risk of stroke. This is more likely to develop in people who have high blood pressure readings or are overweight. Talk with your health care provider about your target blood pressure readings. Have your blood pressure checked: Every 3-5 years if you are 18-39 years of age. Every year if you are 40 years old or older. If you are between the ages of 65 and 75 and are a current or former smoker, ask your health care provider if you should have a one-time screening for abdominal aortic aneurysm (AAA). Diabetes Have regular diabetes screenings. This checks your fasting blood sugar level. Have the screening done: Once every three years after age 45 if you are at a normal weight and have a low risk for diabetes. More often and at a younger age if you are overweight or have a high risk for diabetes. What should I know about preventing infection? Hepatitis B If you have a higher risk for hepatitis B, you should be screened for this virus. Talk with your health care provider to find out if you are at risk for hepatitis B infection. Hepatitis C Blood testing is recommended for: Everyone born from 1945 through 1965. Anyone with known risk factors for hepatitis C. Sexually transmitted infections (STIs) You should be screened each year for STIs, including gonorrhea and chlamydia, if: You are sexually active and are younger than 83 years of age. You are older than 83 years of age and your   health care provider tells you that you are at risk for this type of infection. Your sexual activity has changed since you were last screened, and you are at increased risk for chlamydia or gonorrhea. Ask your health care provider if you are at risk. Ask your health care provider about whether you are at high risk for HIV. Your health care provider  may recommend a prescription medicine to help prevent HIV infection. If you choose to take medicine to prevent HIV, you should first get tested for HIV. You should then be tested every 3 months for as long as you are taking the medicine. Follow these instructions at home: Alcohol use Do not drink alcohol if your health care provider tells you not to drink. If you drink alcohol: Limit how much you have to 0-2 drinks a day. Know how much alcohol is in your drink. In the [place] one drink equals one 12 oz bottle of beer (355 mL), one 5 oz glass of Katie Faraone (148 mL), or one 1 oz glass of hard liquor (44 mL). Lifestyle Do not use any products that contain nicotine or tobacco. These products include cigarettes, chewing tobacco, and vaping devices, such as e-cigarettes. If you need help quitting, ask your health care provider. Do not use street drugs. Do not share needles. Ask your health care provider for help if you need support or information about quitting drugs. General instructions Schedule regular health, dental, and eye exams. Stay current with your vaccines. Tell your health care provider if: You often feel depressed. You have ever been abused or do not feel safe at home. Summary Adopting a healthy lifestyle and getting preventive care are important in promoting health and wellness. Follow your health care provider's instructions about healthy diet, exercising, and getting tested or screened for diseases. Follow your health care provider's instructions on monitoring your cholesterol and blood pressure. This information is not intended to replace advice given to you by your health care provider. Make sure you discuss any questions you have with your health care provider. Document Revised: 11/18/2020 Document Reviewed: 11/18/2020 Elsevier Patient Education  2023 Elsevier Inc.  

## 2022-05-22 ENCOUNTER — Ambulatory Visit (INDEPENDENT_AMBULATORY_CARE_PROVIDER_SITE_OTHER): Payer: Medicare Other | Admitting: *Deleted

## 2022-05-22 DIAGNOSIS — Z0000 Encounter for general adult medical examination without abnormal findings: Secondary | ICD-10-CM | POA: Diagnosis not present

## 2022-05-26 ENCOUNTER — Ambulatory Visit (INDEPENDENT_AMBULATORY_CARE_PROVIDER_SITE_OTHER): Payer: Medicare Other | Admitting: Emergency Medicine

## 2022-05-26 ENCOUNTER — Encounter: Payer: Self-pay | Admitting: Emergency Medicine

## 2022-05-26 VITALS — BP 108/58 | HR 76 | Temp 97.6°F | Ht 70.0 in

## 2022-05-26 DIAGNOSIS — Z87440 Personal history of urinary (tract) infections: Secondary | ICD-10-CM

## 2022-05-26 DIAGNOSIS — R031 Nonspecific low blood-pressure reading: Secondary | ICD-10-CM | POA: Insufficient documentation

## 2022-05-26 LAB — CBC WITH DIFFERENTIAL/PLATELET
Basophils Absolute: 0 10*3/uL (ref 0.0–0.1)
Basophils Relative: 0.6 % (ref 0.0–3.0)
Eosinophils Absolute: 0.2 10*3/uL (ref 0.0–0.7)
Eosinophils Relative: 3.9 % (ref 0.0–5.0)
HCT: 39.1 % (ref 39.0–52.0)
Hemoglobin: 12.4 g/dL — ABNORMAL LOW (ref 13.0–17.0)
Lymphocytes Relative: 18.4 % (ref 12.0–46.0)
Lymphs Abs: 1.2 10*3/uL (ref 0.7–4.0)
MCHC: 31.8 g/dL (ref 30.0–36.0)
MCV: 85.5 fl (ref 78.0–100.0)
Monocytes Absolute: 0.6 10*3/uL (ref 0.1–1.0)
Monocytes Relative: 9.6 % (ref 3.0–12.0)
Neutro Abs: 4.3 10*3/uL (ref 1.4–7.7)
Neutrophils Relative %: 67.5 % (ref 43.0–77.0)
Platelets: 157 10*3/uL (ref 150.0–400.0)
RBC: 4.58 Mil/uL (ref 4.22–5.81)
RDW: 13.9 % (ref 11.5–15.5)
WBC: 6.3 10*3/uL (ref 4.0–10.5)

## 2022-05-26 LAB — COMPREHENSIVE METABOLIC PANEL
ALT: 16 U/L (ref 0–53)
AST: 15 U/L (ref 0–37)
Albumin: 3.8 g/dL (ref 3.5–5.2)
Alkaline Phosphatase: 53 U/L (ref 39–117)
BUN: 29 mg/dL — ABNORMAL HIGH (ref 6–23)
CO2: 31 mEq/L (ref 19–32)
Calcium: 8.8 mg/dL (ref 8.4–10.5)
Chloride: 95 mEq/L — ABNORMAL LOW (ref 96–112)
Creatinine, Ser: 1.35 mg/dL (ref 0.40–1.50)
GFR: 48.5 mL/min — ABNORMAL LOW (ref >60.00)
Glucose, Bld: 333 mg/dL — ABNORMAL HIGH (ref 70–99)
Potassium: 4.2 mEq/L (ref 3.5–5.1)
Sodium: 130 mEq/L — ABNORMAL LOW (ref 135–145)
Total Bilirubin: 0.6 mg/dL (ref 0.2–1.2)
Total Protein: 6.4 g/dL (ref 6.0–8.3)

## 2022-05-26 NOTE — Progress Notes (Signed)
Frank [name] 81 y.o.   Chief Complaint  Patient presents with   Follow-up    Patient coming in for BP, low bp reading     HISTORY OF PRESENT ILLNESS: Acute problem visit today.  Patient of Dr. Scarlette Calico. This is a 81 y.o. male accompanied by his wife and nurses aide for evaluation of low blood pressure readings mostly yesterday.  Had a near fall.  Felt weak and was helped to the floor.  No bleeding episodes.  Has not been eating or drinking the way he should Recently had UTI.  Has indwelling Foley catheter.  Was treated with 2 different antibiotics for at least 7 days.  No recent fever.  No flulike symptoms.  No nausea or vomiting.  Denies abdominal pain, chest pain or difficulty breathing. Today feeling better and blood pressures back to near normal.  HPI   Prior to Admission medications   Medication Sig Start Date End Date Taking? Authorizing Provider  atorvastatin (LIPITOR) 10 MG tablet TAKE ONE-HALF TABLET BY MOUTH  DAILY 02/15/22  Yes Janith Lima, MD  BRILINTA 90 MG TABS tablet Take 1/2 (one-half) tablet by mouth twice daily 02/27/22  Yes Janith Lima, MD  Ferric Maltol (ACCRUFER) 30 MG CAPS Take 1 capsule by mouth in the morning and at bedtime. 05/15/22  Yes Janith Lima, MD  FLUoxetine (PROZAC) 40 MG capsule Take 40 mg by mouth daily. 01/26/22  Yes [provider]  Insulin Pen Needle 32G X 6 MM MISC 1 Act by Does not apply route daily. 04/08/20  Yes Janith Lima, MD  lactose free nutrition (BOOST) LIQD Take 237 mLs by mouth 2 (two) times daily between meals.   Yes [provider]  levETIRAcetam (KEPPRA) 500 MG tablet Take 1 tablet by mouth twice daily 02/24/22  Yes Janith Lima, MD  methylphenidate (METADATE CD) 10 MG CR capsule Take 1 capsule (10 mg total) by mouth every morning. 01/27/22  Yes Janith Lima, MD  mineral oil-hydrophilic petrolatum (AQUAPHOR) ointment Apply 1 application topically as needed for dry skin.   Yes [provider]  PRESCRIPTION MEDICATION CPAP- At bedtime   Yes [provider]  TOUJEO SOLOSTAR 300 UNIT/ML Solostar Pen INJECT SUBCUTANEOUSLY 10 UNITS  DAILY 04/01/22  Yes Janith Lima, MD  FLUoxetine (PROZAC) 20 MG capsule TAKE 1 CAPSULE BY MOUTH  DAILY Patient not taking: Reported on 05/22/2022 08/29/21   Janith Lima, MD    Allergies  Allergen Reactions   Lisinopril Cough   Invokana [Canagliflozin] Other (See Comments)    Stopped by MD, unknown reaction    Penicillins Other (See Comments)    Allergic as a child.  Patient does not remember reaction.  Has had cephalasporins without problems.   Sulfamethoxazole Other (See Comments)    Unknown reaction- from childhood    Patient Active Problem List   Diagnosis Date Noted   Uncontrolled daytime somnolence 01/27/2022   Depression, major, single episode, moderate ([place]) 01/25/2022   Caregiver stress 01/25/2022   Hypoglycemia associated with type 2 diabetes mellitus ([place]) 12/25/2021   Palliative care encounter 11/10/2021   Urinary tract infection without hematuria 10/22/2021   Pacemaker 10/20/2021   History of traumatic brain injury 05/19/2021   Carotid stenosis, symptomatic, with infarction ([place]) 03/31/2021   Atherosclerosis of aorta ([place]) 02/10/2021   Simple chronic bronchitis ([place]) 11/13/2020   Iron deficiency anemia secondary to inadequate dietary iron intake 11/06/2020   DNR (do not resuscitate) discussion 10/17/2020  Stroke (cerebrum) ([place]) 09/09/2020   Middle cerebral artery embolism, 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   AV block, 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 ([place]) 11/28/2019   Chronic renal disease, stage 3, moderately decreased glomerular filtration rate (GFR) between 30-59 mL/min/1.73 square meter ([place]) 11/21/2018   GERD without esophagitis 07/22/2017   Type 2 diabetes mellitus with peripheral neuropathy ([place])    OAB (overactive bladder) 11/26/2014    Routine general medical examination at a health care facility 04/06/2014   Vitamin D deficiency 07/23/2010   Sleep apnea 09/13/2008   Hyperlipidemia with target LDL less than 100 07/27/2007   BPH (benign prostatic hyperplasia) 06/21/2007   OPTIC NEUROPATHY, 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   B12 deficiency anemia 04/15/2006   Essential hypertension 04/15/2006   Allergic rhinitis 04/15/2006    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   Cellulitis of left hand 03/25/2021   Diabetes mellitus    Type 2   GERD (gastroesophageal reflux disease)    Hyperlipidemia    Hypertension    Infection of thumb 03/26/2021   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, Ischemic optic   OSA (obstructive sleep apnea)    cpap   Presence of permanent cardiac pacemaker    Sepsis secondary to UTI ([place]) 05/19/2021   TBI (traumatic brain injury) [facility])     Past Surgical History:  Procedure Laterality Date   APPENDECTOMY     arm fracture Left 2011   with hardware   BURR HOLE Bilateral 08/04/2017   Procedure: Haskell Flirt;  Surgeon: [name], MD;  Location: [place];  Service: Neurosurgery;  Laterality: Bilateral;   COLONOSCOPY     CRANIOTOMY Left 12/01/2017   Procedure: Trudee Kuster Holes CRANIOTOMY HEMATOMA EVACUATION SUBDURAL;  Surgeon: [name], MD;  Location: [place];  Service: Neurosurgery;  Laterality: Left;   ESOPHAGOGASTRODUODENOSCOPY  2006   gastritis   IR ANGIO INTRA EXTRACRAN SEL COM CAROTID INNOMINATE UNI L MOD SED  03/31/2021   IR ANGIO VERTEBRAL SEL SUBCLAVIAN INNOMINATE UNI R MOD SED  03/31/2021   IR CT HEAD LTD  03/31/2021   IR INTRAVSC STENT CERV CAROTID W/EMB-PROT MOD SED INCL ANGIO  03/31/2021   IR PERCUTANEOUS ART THROMBECTOMY/INFUSION INTRACRANIAL INC DIAG ANGIO  09/09/2020   IR RADIOLOGIST EVAL & MGMT  03/10/2021   IR RADIOLOGIST EVAL & MGMT  04/28/2021   PACEMAKER IMPLANT N/A 05/14/2020   Procedure: PACEMAKER  IMPLANT;  Surgeon: [name], MD;  Location: [facility];  Service: Cardiovascular;  Laterality: N/A;   RADIOLOGY WITH ANESTHESIA N/A 09/09/2020   Procedure: IR WITH ANESTHESIA;  Surgeon: Radiologist, Medication, MD;  Location: [place];  Service: Radiology;  Laterality: N/A;   RADIOLOGY WITH ANESTHESIA N/A 03/31/2021   Procedure: IR WITH ANESTHESIA STENT PLACEMENT;  Surgeon: Luanne Bras, MD;  Location: [place];  Service: Radiology;  Laterality: N/A;   ROTATOR CUFF REPAIR     SEPTOPLASTY  1959   Deviated Septum   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    Social History   Socioeconomic History   Marital status: Married    Spouse name: Not on file   Number of children: 1   Years of education: Not on file   Highest education level: Not on file  Occupational History   Not on file  Tobacco Use  Smoking status: Former    Packs/day: 1.00    Types: Cigarettes    Quit date: 07/14/1983    Years since quitting: 38.8   Smokeless tobacco: Never  Vaping Use   Vaping Use: Never used  Substance and Sexual Activity   Alcohol use: Yes    Alcohol/week: 2.0 standard drinks of alcohol    Types: 2 Standard drinks or equivalent per week    Comment: occassionally   Drug use: No   Sexual activity: Never  Other Topics Concern   Not on file  Social History Narrative   Right handed    Lives with wife   Has a caregiver    Social Determinants of Health   Financial Resource Strain: Low Risk  (05/18/2022)   Overall Financial Resource Strain (CARDIA)    Difficulty of Paying Living Expenses: Not very hard  Food Insecurity: Unknown (05/18/2022)   Hunger Vital Sign    Worried About Running Out of Food in the Last Year: Never true    [place] in the Last Year: Not on file  Transportation Needs: No Transportation Needs (05/18/2022)   PRAPARE - Hydrologist (Medical): No    Lack of Transportation (Non-Medical): No  Physical Activity: Insufficiently Active  (05/18/2022)   Exercise Vital Sign    Days of Exercise per Week: 2 days    Minutes of Exercise per Session: 20 min  Stress: No Stress Concern Present (05/18/2022)   [place]    Feeling of Stress : Not at all  Social Connections: Moderately Integrated (05/18/2022)   Social Connection and Isolation Panel [NHANES]    Frequency of Communication with Friends and Family: More than three times a week    Frequency of Social Gatherings with Friends and Family: Three times a week    Attends Religious Services: Never    Active Member of Clubs or Organizations: Yes    Attends Archivist Meetings: More than 4 times per year    Marital Status: Married  Human resources officer Violence: Not At Risk (05/22/2022)   Humiliation, Afraid, Rape, and Kick questionnaire    Fear of Current or Ex-Partner: No    Emotionally Abused: No    Physically Abused: No    Sexually Abused: No    Family History  Problem Relation Age of Onset   Heart disease Mother    Breast cancer Mother    Stroke Father    Allergies Father        Father and children   Coronary artery disease Brother    Diabetes Neg Hx    Colon cancer Neg Hx      Review of Systems  Constitutional: Negative.  Negative for chills and fever.  HENT: Negative.  Negative for congestion and sore throat.   Respiratory: Negative.  Negative for cough and shortness of breath.   Cardiovascular: Negative.  Negative for chest pain and palpitations.  Gastrointestinal:  Negative for abdominal pain, diarrhea, nausea and vomiting.  Genitourinary:  Negative for dysuria and hematuria.  Skin:  Negative for rash.  Neurological: Negative.  Negative for dizziness and headaches.  All other systems reviewed and are negative.   Today's Vitals   05/26/22 1524  BP: (!) 108/58  Pulse: 76  Temp: 97.6 F (36.4 C)  TempSrc: Oral  SpO2: 95%  Height: '5\' 10"'$  (1.778 m)   Body mass index is 25.11  kg/m.  Physical Exam Vitals reviewed.  Constitutional:      Appearance: Normal appearance.  HENT:     Head: Normocephalic.     Mouth/Throat:     Mouth: Mucous membranes are moist.     Pharynx: Oropharynx is clear.  Eyes:     Extraocular Movements: Extraocular movements intact.     Pupils: Pupils are equal, round, and reactive to light.  Cardiovascular:     Rate and Rhythm: Normal rate and regular rhythm.     Pulses: Normal pulses.     Heart sounds: Normal heart sounds.  Pulmonary:     Effort: Pulmonary effort is normal.     Breath sounds: Normal breath sounds.  Abdominal:     Palpations: Abdomen is soft.     Tenderness: There is no abdominal tenderness.  Musculoskeletal:     Cervical back: No tenderness.  Lymphadenopathy:     Cervical: No cervical adenopathy.  Skin:    General: Skin is warm and dry.  Neurological:     Mental Status: He is alert and oriented to person, place, and time.  Psychiatric:        Mood and Affect: Mood normal.        Behavior: Behavior normal.      ASSESSMENT & PLAN: A total of 49 minutes was spent with the patient and counseling/coordination of care regarding preparing for this visit, review of available medical records on most recent office visit notes, review of multiple chronic medical conditions and their management, review of all medications, differential diagnosis of hypotension, need for blood work and urinalysis/urine culture, ED precautions, need to increase oral intake of both fluids and food, prognosis, documentation, and need for follow-up.  Problem List Items Addressed This Visit       Cardiovascular and Mediastinum   Low blood pressure reading - Primary    Low blood pressure readings yesterday.  Back to normal today. Asymptomatic.  No signs of sepsis. No GI bleeding.  No new medications. Possible dehydration and poor oral intake lately. ED precautions given. Blood work and urinalysis done today. No bradycardia arrhythmia.   Pacemaker in place.      Relevant Orders   CBC with Differential/Platelet   Comprehensive metabolic panel     Other   Recent urinary tract infection    No clinical signs of sepsis. We will repeat urinalysis and urine culture today. Foley catheter remains in place.      Relevant Orders   Urine Culture   Urinalysis   Patient Instructions  Hypotension As your heart beats, it forces blood through your body. This force is called blood pressure. If you have hypotension, you have low blood pressure.  When your blood pressure is too low, you may not get enough blood to your brain or other parts of your body. This may cause you to feel weak, light-headed, have a fast heartbeat, or even faint. Low blood pressure may be harmless, or it may cause serious problems. What are the causes? Blood loss. Not enough water in the body (dehydration). Heart problems. Hormone problems. Pregnancy. A very bad infection. Not having enough of certain nutrients. Very bad allergic reactions. Certain medicines. What increases the risk? Age. The risk increases as you get older. Conditions that affect the heart or the brain and spinal cord (central nervous system). What are the signs or symptoms? Feeling: Weak. Light-headed. Dizzy. Tired (fatigued). Blurred vision. Fast heartbeat. Fainting, in very bad cases. How is this treated? Changing your diet. This may involve drinking more water or including  more salt (sodium) in your diet by eating high-salt foods. Taking medicines to raise your blood pressure. Changing how much you take (the dosage) of some of your medicines. Wearing compression stockings. These stockings help to prevent blood clots and reduce swelling in your legs. In some cases, you may need to go to the hospital to: Receive fluids through an IV tube. Receive donated blood through an IV tube (transfusion). Get treated for an infection or heart problems, if this applies. Be monitored  while medicines that you are taking wear off. Follow these instructions at home: Eating and drinking  Drink enough fluids to keep your pee (urine) pale yellow. Eat a healthy diet. Follow instructions from your doctor about what you can eat or drink. A healthy diet includes: Fresh fruits and vegetables. Whole grains. Low-fat (lean) meats. Low-fat dairy products. If told, include more salt in your diet. Do not add extra salt to your diet unless your doctor tells you to. Eat small meals often. Avoid standing up quickly after you eat. Medicines Take over-the-counter and prescription medicines only as told by your doctor. Follow instructions from your doctor about changing how much you take of your medicines, if this applies. Do not stop or change any of your medicines on your own. General instructions  Wear compression stockings as told by your doctor. Get up slowly from lying down or sitting. Avoid hot showers and a lot of heat as told by your doctor. Return to your normal activities when your doctor says that it is safe. Do not smoke or use any products that contain nicotine or tobacco. If you need help quitting, ask your doctor. Keep all follow-up visits. Contact a doctor if: You vomit. You have watery poop (diarrhea). You have a fever for more than 2-3 days. You feel more thirsty than normal. You feel weak and tired. Get help right away if: You have chest pain. You have a fast or uneven heartbeat. You lose feeling (have numbness) in any part of your body. You cannot move your arms or your legs. You have trouble talking. You get sweaty or feel light-headed. You faint. You have trouble breathing. You have trouble staying awake. You feel mixed up (confused). These symptoms may be an emergency. Get help right away. Call 911. Do not wait to see if the symptoms will go away. Do not drive yourself to the hospital. Summary Hypotension is also called low blood pressure. It is  when the force of blood pumping through your body is too weak. Hypotension may be harmless, or it may cause serious problems. Treatment may include changing your diet and medicines, and wearing compression stockings. In very bad cases, you may need to go to the hospital. This information is not intended to replace advice given to you by your health care provider. Make sure you discuss any questions you have with your health care provider. Document Revised: 02/17/2021 Document Reviewed: 02/17/2021 Elsevier Patient Education  [place], MD [facility]

## 2022-05-26 NOTE — Assessment & Plan Note (Signed)
No clinical signs of sepsis. We will repeat urinalysis and urine culture today. Foley catheter remains in place.

## 2022-05-26 NOTE — Patient Instructions (Signed)
Hypotension As your heart beats, it forces blood through your body. This force is called blood pressure. If you have hypotension, you have low blood pressure.  When your blood pressure is too low, you may not get enough blood to your brain or other parts of your body. This may cause you to feel weak, light-headed, have a fast heartbeat, or even faint. Low blood pressure may be harmless, or it may cause serious problems. What are the causes? Blood loss. Not enough water in the body (dehydration). Heart problems. Hormone problems. Pregnancy. A very bad infection. Not having enough of certain nutrients. Very bad allergic reactions. Certain medicines. What increases the risk? Age. The risk increases as you get older. Conditions that affect the heart or the brain and spinal cord (central nervous system). What are the signs or symptoms? Feeling: Weak. Light-headed. Dizzy. Tired (fatigued). Blurred vision. Fast heartbeat. Fainting, in very bad cases. How is this treated? Changing your diet. This may involve drinking more water or including more salt (sodium) in your diet by eating high-salt foods. Taking medicines to raise your blood pressure. Changing how much you take (the dosage) of some of your medicines. Wearing compression stockings. These stockings help to prevent blood clots and reduce swelling in your legs. In some cases, you may need to go to the hospital to: Receive fluids through an IV tube. Receive donated blood through an IV tube (transfusion). Get treated for an infection or heart problems, if this applies. Be monitored while medicines that you are taking wear off. Follow these instructions at home: Eating and drinking  Drink enough fluids to keep your pee (urine) pale yellow. Eat a healthy diet. Follow instructions from your doctor about what you can eat or drink. A healthy diet includes: Fresh fruits and vegetables. Whole grains. Low-fat (lean) meats. Low-fat  dairy products. If told, include more salt in your diet. Do not add extra salt to your diet unless your doctor tells you to. Eat small meals often. Avoid standing up quickly after you eat. Medicines Take over-the-counter and prescription medicines only as told by your doctor. Follow instructions from your doctor about changing how much you take of your medicines, if this applies. Do not stop or change any of your medicines on your own. General instructions  Wear compression stockings as told by your doctor. Get up slowly from lying down or sitting. Avoid hot showers and a lot of heat as told by your doctor. Return to your normal activities when your doctor says that it is safe. Do not smoke or use any products that contain nicotine or tobacco. If you need help quitting, ask your doctor. Keep all follow-up visits. Contact a doctor if: You vomit. You have watery poop (diarrhea). You have a fever for more than 2-3 days. You feel more thirsty than normal. You feel weak and tired. Get help right away if: You have chest pain. You have a fast or uneven heartbeat. You lose feeling (have numbness) in any part of your body. You cannot move your arms or your legs. You have trouble talking. You get sweaty or feel light-headed. You faint. You have trouble breathing. You have trouble staying awake. You feel mixed up (confused). These symptoms may be an emergency. Get help right away. Call 911. Do not wait to see if the symptoms will go away. Do not drive yourself to the hospital. Summary Hypotension is also called low blood pressure. It is when the force of blood pumping through your body   is too weak. Hypotension may be harmless, or it may cause serious problems. Treatment may include changing your diet and medicines, and wearing compression stockings. In very bad cases, you may need to go to the hospital. This information is not intended to replace advice given to you by your health care  provider. Make sure you discuss any questions you have with your health care provider. Document Revised: 02/17/2021 Document Reviewed: 02/17/2021 Elsevier Patient Education  2023 [employer] Inc[employer]  

## 2022-05-26 NOTE — Progress Notes (Signed)
Remote pacemaker transmission.   

## 2022-05-26 NOTE — Assessment & Plan Note (Addendum)
Low blood pressure readings yesterday.  Back to normal today. Asymptomatic.  No signs of sepsis. No GI bleeding.  No new medications. Possible dehydration and poor oral intake lately. ED precautions given. Blood work and urinalysis done today. No bradycardia arrhythmia.  Pacemaker in place.

## 2022-05-27 ENCOUNTER — Telehealth: Payer: Self-pay | Admitting: Internal Medicine

## 2022-05-27 ENCOUNTER — Other Ambulatory Visit: Payer: Self-pay | Admitting: Emergency Medicine

## 2022-05-27 DIAGNOSIS — N390 Urinary tract infection, site not specified: Secondary | ICD-10-CM

## 2022-05-27 LAB — URINALYSIS, ROUTINE W REFLEX MICROSCOPIC
Bilirubin Urine: NEGATIVE
Ketones, ur: NEGATIVE
Nitrite: POSITIVE — AB
Specific Gravity, Urine: 1.02 (ref 1.000–1.030)
Total Protein, Urine: 30 — AB
Urine Glucose: NEGATIVE
Urobilinogen, UA: 0.2 (ref 0.0–1.0)
pH: 5.5 (ref 5.0–8.0)

## 2022-05-27 MED ORDER — CIPROFLOXACIN HCL 500 MG PO TABS: 500.0000 mg | ORAL_TABLET | Freq: Two times a day (BID) | ORAL | 0 refills | Status: DC

## 2022-05-27 NOTE — Telephone Encounter (Signed)
Patients wife returned call about message below:   Horald Pollen, MD 05/27/2022 11:54 AM EST Back to Top    Call patient please.  Urinalysis still suggestive of infection.  Urine culture pending.  Recommend to restart antibiotics.  Glucose elevated due to infection.  Prescription for antibiotics sent to pharmacy of record today.   I made her aware of message and she said she is going to go pick up medication

## 2022-05-28 NOTE — Telephone Encounter (Signed)
Noted../lmb 

## 2022-05-30 LAB — URINE CULTURE

## 2022-06-01 ENCOUNTER — Other Ambulatory Visit: Payer: Self-pay | Admitting: Internal Medicine

## 2022-06-01 ENCOUNTER — Ambulatory Visit (INDEPENDENT_AMBULATORY_CARE_PROVIDER_SITE_OTHER): Payer: Medicare Other

## 2022-06-01 ENCOUNTER — Other Ambulatory Visit: Payer: Self-pay | Admitting: Emergency Medicine

## 2022-06-01 DIAGNOSIS — E538 Deficiency of other specified B group vitamins: Secondary | ICD-10-CM

## 2022-06-01 DIAGNOSIS — R569 Unspecified convulsions: Secondary | ICD-10-CM

## 2022-06-01 MED ORDER — CEFUROXIME AXETIL 500 MG PO TABS: 500.0000 mg | ORAL_TABLET | Freq: Two times a day (BID) | ORAL | 0 refills | Status: AC

## 2022-06-01 MED ORDER — CYANOCOBALAMIN 1000 MCG/ML IJ SOLN
1000.0000 ug | Freq: Once | INTRAMUSCULAR | Status: AC
  Administered 2022-06-01: 1000 ug via INTRAMUSCULAR

## 2022-06-01 NOTE — Progress Notes (Signed)
After obtaining consent, and per orders of Dr. Jones, injection of B12 was given in the left deltoid by Damin Salido P Zoraya Fiorenza. Patient instructed to report any adverse reaction to me immediately.  

## 2022-06-08 ENCOUNTER — Other Ambulatory Visit: Payer: Self-pay | Admitting: Internal Medicine

## 2022-06-08 DIAGNOSIS — N3281 Overactive bladder: Secondary | ICD-10-CM

## 2022-06-09 ENCOUNTER — Encounter (HOSPITAL_BASED_OUTPATIENT_CLINIC_OR_DEPARTMENT_OTHER): Payer: Self-pay | Admitting: Emergency Medicine

## 2022-06-09 ENCOUNTER — Other Ambulatory Visit: Payer: Self-pay

## 2022-06-09 ENCOUNTER — Emergency Department (HOSPITAL_BASED_OUTPATIENT_CLINIC_OR_DEPARTMENT_OTHER)
Admission: EM | Admit: 2022-06-09 | Discharge: 2022-06-09 | Disposition: A | Discharge status: Home / Self Care | Payer: Medicare Other | Attending: Emergency Medicine | Admitting: Emergency Medicine

## 2022-06-09 ENCOUNTER — Emergency Department (HOSPITAL_BASED_OUTPATIENT_CLINIC_OR_DEPARTMENT_OTHER): Payer: Medicare Other

## 2022-06-09 DIAGNOSIS — Y999 Unspecified external cause status: Secondary | ICD-10-CM | POA: Insufficient documentation

## 2022-06-09 DIAGNOSIS — S60512A Abrasion of left hand, initial encounter: Secondary | ICD-10-CM | POA: Diagnosis not present

## 2022-06-09 DIAGNOSIS — Z043 Encounter for examination and observation following other accident: Secondary | ICD-10-CM | POA: Diagnosis not present

## 2022-06-09 DIAGNOSIS — W228XXA Striking against or struck by other objects, initial encounter: Secondary | ICD-10-CM | POA: Insufficient documentation

## 2022-06-09 DIAGNOSIS — S098XXA Other specified injuries of head, initial encounter: Secondary | ICD-10-CM | POA: Diagnosis not present

## 2022-06-09 DIAGNOSIS — M79642 Pain in left hand: Secondary | ICD-10-CM | POA: Diagnosis present

## 2022-06-09 DIAGNOSIS — Y929 Unspecified place or not applicable: Secondary | ICD-10-CM | POA: Diagnosis not present

## 2022-06-09 DIAGNOSIS — W19XXXA Unspecified fall, initial encounter: Secondary | ICD-10-CM

## 2022-06-09 DIAGNOSIS — Y939 Activity, unspecified: Secondary | ICD-10-CM | POA: Diagnosis not present

## 2022-06-09 DIAGNOSIS — S0990XA Unspecified injury of head, initial encounter: Secondary | ICD-10-CM | POA: Insufficient documentation

## 2022-06-09 NOTE — ED Provider Notes (Signed)
[facility] DEPT Provider Note   CSN: [id number] Arrival date & time: 06/09/22  1012     History Chief Complaint  Patient presents with   Fall    HPI Frank [name] is a 81 y.o. male presenting for chief complaint of ground-level fall approximately 72 hours prior to arrival.  He was getting out of bed on Saturday night when he fell.  He was at a PT appointment today when they recommended that he come to the emergency department since he takes [place].  He denies fevers or chills, nausea vomiting, syncope shortness of breath.  He is otherwise ambulatory tolerating p.o. intake.  Per wife at bedside he has been doing overall well, they are only presenting because of recommendations.. 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  Patient's recorded medical, surgical, social, medication list and allergies were reviewed in the Snapshot window as part of the initial history.   Review of Systems   Review of Systems  Constitutional:  Negative for chills and fever.  HENT:  Negative for ear pain and sore throat.   Eyes:  Negative for pain and visual disturbance.  Respiratory:  Negative for cough and shortness of breath.   Cardiovascular:  Negative for chest pain and palpitations.  Gastrointestinal:  Negative for abdominal pain and vomiting.  Genitourinary:  Negative for dysuria and hematuria.  Musculoskeletal:  Negative for arthralgias and back pain.  Skin:  Negative for color change and rash.  Neurological:  Negative for seizures and syncope.  All other systems reviewed and are negative.   Physical Exam Updated Vital Signs BP (!) 123/51 (BP Location: Left Arm)   Pulse 72   Temp 97.6 F (36.4 C) (Temporal)   Resp 16   Wt 79.8 kg   SpO2 93%   BMI 25.25 kg/m  Physical Exam Vitals and nursing note reviewed.  Constitutional:      General: He is not in acute distress.    Appearance: He is well-developed.   HENT:     Head: Normocephalic and atraumatic.  Eyes:     Conjunctiva/sclera: Conjunctivae normal.  Cardiovascular:     Rate and Rhythm: Normal rate and regular rhythm.     Heart sounds: No murmur heard. Pulmonary:     Effort: Pulmonary effort is normal. No respiratory distress.     Breath sounds: Normal breath sounds.  Abdominal:     Palpations: Abdomen is soft.     Tenderness: There is no abdominal tenderness.  Musculoskeletal:        General: No swelling.     Cervical back: Neck supple.  Skin:    General: Skin is warm and dry.     Capillary Refill: Capillary refill takes less than 2 seconds.  Neurological:     Mental Status: He is alert.  Psychiatric:        Mood and Affect: Mood normal.      ED Course/ Medical Decision Making/ A&P Clinical Course as of 06/09/22 1140  Tue Jun 09, 2022  1112 Chronic SDH new acute on chronic SDH [CC]    Clinical Course User Index [CC] Tretha Sciara, MD    Procedures Procedures   Medications Ordered in ED Medications - No data to display Medical Decision Making:    Nakeem Murnane is a 81 y.o. male who presented to the ED today with a moderate mechanisma trauma, detailed above.    Additional history discussed with patient's family/caregivers.  Patient's presentation is complicated by their history of multiple comorbid medical conditions on chronic outpatient medication regimens.  Patient placed on continuous vitals and telemetry monitoring while in ED which was reviewed periodically.   Given this mechanism of trauma, a full physical exam was performed. Notably, patient was hemodynamically stable in no acute distress.  No neck tenderness, no visible deformities on complete physical exam.  All joints ranged without any focal pain appreciated.  Abrasion to left hand..   Reviewed and confirmed nursing documentation for past medical history, family history, social history.    Initial Assessment/Plan:   This is a patient  presenting with a moderate mechanism trauma.  As such, I have considered intracranial injuries including intracranial hemorrhage, intrathoracic injuries including blunt myocardial or blunt lung injury, blunt abdominal injuries including aortic dissection, bladder injury, spleen injury, liver injury and I have considered orthopedic injuries including extremity or spinal injury.  With the patient's presentation of moderate mechanism trauma but an otherwise reassuring exam, patient warrants targeted evaluation for potential traumatic injuries. Will proceed with targeted evaluation for potential injuries. Will proceed with CTH. 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   Final Reassessment and Plan:   Patient does have an intracranial hemorrhage that is likely from his fall 1970 2 hours prior to arrival.  However even with antiplatelet utilization, he has had a prolonged observation phase and is asymptomatic.  I do not believe that any further intervention in the emergency department is indicated.  He will need to follow-up with his primary care provider within 48 hours for reassessment of his symptoms.  We discussed risk of rebleeding on antiplatelets and if patient begins to have any sudden onset pain, headache or confusion he will need to immediately proceed to emergency department.  Family expressed understanding and patient was discharged with no further acute events.  I believe that risk of stopping his antiplatelet would likely supersede his risk of bleeding this far out from his insult.   Disposition:  I have considered need for hospitalization, however, considering all of the above, I believe this patient is stable for discharge at this time.  Patient/family educated about specific return precautions for given chief complaint and symptoms.  Patient/family educated about follow-up with PCP.     Patient/family expressed understanding  of return precautions and need for follow-up. Patient spoken to regarding all imaging and laboratory results and appropriate follow up for these results. All education provided in verbal form with additional information in written form. Time was allowed for answering of patient questions. Patient discharged.    Emergency Department Medication Summary:   Medications - No data to display         Clinical Impression:  1. Fall, initial encounter      Discharge   Final Clinical Impression(s) / ED Diagnoses Final diagnoses:  Fall, initial encounter    Rx / DC Orders ED Discharge Orders     None         Tretha Sciara, MD 06/09/22 1140

## 2022-06-09 NOTE — ED Notes (Signed)
All appropriate discharge materials reviewed at length with patient. Time for questions provided. Pt has no other questions at this time and verbalizes understanding of all provided materials.  

## 2022-06-09 NOTE — ED Triage Notes (Signed)
Pt arrives to ED with c/o fall. He has many falls. He hit his head and left shoulder on a dresser on 11/25. No LOC.

## 2022-06-11 ENCOUNTER — Telehealth: Payer: Self-pay | Admitting: Internal Medicine

## 2022-06-11 ENCOUNTER — Other Ambulatory Visit: Payer: Self-pay | Admitting: Internal Medicine

## 2022-06-11 ENCOUNTER — Telehealth: Payer: Self-pay

## 2022-06-11 DIAGNOSIS — U071 COVID-19: Secondary | ICD-10-CM

## 2022-06-11 MED ORDER — MOLNUPIRAVIR EUA 200MG CAPSULE: 4.0000 | ORAL_CAPSULE | Freq: Two times a day (BID) | ORAL | 0 refills | Status: DC

## 2022-06-11 MED ORDER — MOLNUPIRAVIR EUA 200MG CAPSULE: 4.0000 | ORAL_CAPSULE | Freq: Two times a day (BID) | ORAL | 0 refills | Status: AC

## 2022-06-11 NOTE — Telephone Encounter (Signed)
Pt wife states the medication molnupiravir EUA (LAGEVRIO) 200 mg CAPS capsule  [facility] on [address] has the rx and the pts wife states they do have the medication in stock.

## 2022-06-11 NOTE — Telephone Encounter (Signed)
Pt has tested POS for COVID this morning and he is experiencing weakness and fatigue x1 day.  Pts wife asked that an Anti-viral medication in for him please as they both have tested POS for COVID.

## 2022-06-11 NOTE — Telephone Encounter (Signed)
[facility] states that they are out of the molnupiravir EUA (LAGEVRIO) 200 mg CAPS capsule RX and won't be able to fill the prescription.   States they do have paxlovid, but will need a new RX if we want to make that change.

## 2022-06-15 NOTE — Telephone Encounter (Signed)
Error

## 2022-06-16 ENCOUNTER — Telehealth: Payer: Self-pay | Admitting: Internal Medicine

## 2022-06-16 ENCOUNTER — Ambulatory Visit: Payer: Medicare Other | Admitting: Internal Medicine

## 2022-06-16 NOTE — Telephone Encounter (Signed)
Mr. Goar had a uti a few weeks ago.  [facility] would like the results from that urinalysis.  Please fax to [phone].  Please make attention:  Will

## 2022-06-17 ENCOUNTER — Encounter: Payer: Self-pay | Admitting: Internal Medicine

## 2022-06-17 ENCOUNTER — Ambulatory Visit (INDEPENDENT_AMBULATORY_CARE_PROVIDER_SITE_OTHER): Payer: Medicare Other | Admitting: Internal Medicine

## 2022-06-17 VITALS — BP 116/62 | HR 71 | Temp 97.6°F | Ht 70.0 in

## 2022-06-17 DIAGNOSIS — Z87440 Personal history of urinary (tract) infections: Secondary | ICD-10-CM

## 2022-06-17 DIAGNOSIS — E1142 Type 2 diabetes mellitus with diabetic polyneuropathy: Secondary | ICD-10-CM | POA: Diagnosis not present

## 2022-06-17 DIAGNOSIS — Z0001 Encounter for general adult medical examination with abnormal findings: Secondary | ICD-10-CM

## 2022-06-17 DIAGNOSIS — I10 Essential (primary) hypertension: Secondary | ICD-10-CM | POA: Diagnosis not present

## 2022-06-17 DIAGNOSIS — Z0000 Encounter for general adult medical examination without abnormal findings: Secondary | ICD-10-CM

## 2022-06-17 DIAGNOSIS — N1832 Chronic kidney disease, stage 3b: Secondary | ICD-10-CM

## 2022-06-17 LAB — HEMOGLOBIN A1C: Hgb A1c MFr Bld: 7.3 % — ABNORMAL HIGH (ref 4.6–6.5)

## 2022-06-17 LAB — BASIC METABOLIC PANEL
BUN: 31 mg/dL — ABNORMAL HIGH (ref 6–23)
CO2: 30 mEq/L (ref 19–32)
Calcium: 8.6 mg/dL (ref 8.4–10.5)
Chloride: 100 mEq/L (ref 96–112)
Creatinine, Ser: 1.53 mg/dL — ABNORMAL HIGH (ref 0.40–1.50)
GFR: 41.71 mL/min — ABNORMAL LOW (ref >60.00)
Glucose, Bld: 190 mg/dL — ABNORMAL HIGH (ref 70–99)
Potassium: 4 mEq/L (ref 3.5–5.1)
Sodium: 135 mEq/L (ref 135–145)

## 2022-06-17 MED ORDER — METHENAMINE HIPPURATE 1 G PO TABS: 1.0000 g | ORAL_TABLET | Freq: Two times a day (BID) | ORAL | 1 refills | Status: DC

## 2022-06-17 NOTE — Patient Instructions (Signed)
Health Maintenance, Male Adopting a healthy lifestyle and getting preventive care are important in promoting health and wellness. Ask your health care provider about: The right schedule for you to have regular tests and exams. Things you can do on your own to prevent diseases and keep yourself healthy. What should I know about diet, weight, and exercise? Eat a healthy diet  Eat a diet that includes plenty of vegetables, fruits, low-fat dairy products, and lean protein. Do not eat a lot of foods that are high in solid fats, added sugars, or sodium. Maintain a healthy weight Body mass index (BMI) is a measurement that can be used to identify possible weight problems. It estimates body fat based on height and weight. Your health care provider can help determine your BMI and help you achieve or maintain a healthy weight. Get regular exercise Get regular exercise. This is one of the most important things you can do for your health. Most adults should: Exercise for at least 150 minutes each week. The exercise should increase your heart rate and make you sweat (moderate-intensity exercise). Do strengthening exercises at least twice a week. This is in addition to the moderate-intensity exercise. Spend less time sitting. Even light physical activity can be beneficial. Watch cholesterol and blood lipids Have your blood tested for lipids and cholesterol at 83 years of age, then have this test every 5 years. You may need to have your cholesterol levels checked more often if: Your lipid or cholesterol levels are high. You are older than 83 years of age. You are at high risk for heart disease. What should I know about cancer screening? Many types of cancers can be detected early and may often be prevented. Depending on your health history and family history, you may need to have cancer screening at various ages. This may include screening for: Colorectal cancer. Prostate cancer. Skin cancer. Lung  cancer. What should I know about heart disease, diabetes, and high blood pressure? Blood pressure and heart disease High blood pressure causes heart disease and increases the risk of stroke. This is more likely to develop in people who have high blood pressure readings or are overweight. Talk with your health care provider about your target blood pressure readings. Have your blood pressure checked: Every 3-5 years if you are 18-39 years of age. Every year if you are 40 years old or older. If you are between the ages of 65 and 75 and are a current or former smoker, ask your health care provider if you should have a one-time screening for abdominal aortic aneurysm (AAA). Diabetes Have regular diabetes screenings. This checks your fasting blood sugar level. Have the screening done: Once every three years after age 45 if you are at a normal weight and have a low risk for diabetes. More often and at a younger age if you are overweight or have a high risk for diabetes. What should I know about preventing infection? Hepatitis B If you have a higher risk for hepatitis B, you should be screened for this virus. Talk with your health care provider to find out if you are at risk for hepatitis B infection. Hepatitis C Blood testing is recommended for: Everyone born from 1945 through 1965. Anyone with known risk factors for hepatitis C. Sexually transmitted infections (STIs) You should be screened each year for STIs, including gonorrhea and chlamydia, if: You are sexually active and are younger than 83 years of age. You are older than 83 years of age and your   health care provider tells you that you are at risk for this type of infection. Your sexual activity has changed since you were last screened, and you are at increased risk for chlamydia or gonorrhea. Ask your health care provider if you are at risk. Ask your health care provider about whether you are at high risk for HIV. Your health care provider  may recommend a prescription medicine to help prevent HIV infection. If you choose to take medicine to prevent HIV, you should first get tested for HIV. You should then be tested every 3 months for as long as you are taking the medicine. Follow these instructions at home: Alcohol use Do not drink alcohol if your health care provider tells you not to drink. If you drink alcohol: Limit how much you have to 0-2 drinks a day. Know how much alcohol is in your drink. In the [place] one drink equals one 12 oz bottle of beer (355 mL), one 5 oz glass of wine (148 mL), or one 1 oz glass of hard liquor (44 mL). Lifestyle Do not use any products that contain nicotine or tobacco. These products include cigarettes, chewing tobacco, and vaping devices, such as e-cigarettes. If you need help quitting, ask your health care provider. Do not use street drugs. Do not share needles. Ask your health care provider for help if you need support or information about quitting drugs. General instructions Schedule regular health, dental, and eye exams. Stay current with your vaccines. Tell your health care provider if: You often feel depressed. You have ever been abused or do not feel safe at home. Summary Adopting a healthy lifestyle and getting preventive care are important in promoting health and wellness. Follow your health care provider's instructions about healthy diet, exercising, and getting tested or screened for diseases. Follow your health care provider's instructions on monitoring your cholesterol and blood pressure. This information is not intended to replace advice given to you by your health care provider. Make sure you discuss any questions you have with your health care provider. Document Revised: 11/18/2020 Document Reviewed: 11/18/2020 Elsevier Patient Education  2023 [employer]  

## 2022-06-17 NOTE — Progress Notes (Addendum)
Subjective:  Patient ID: Frank [name], male    DOB: 1940-08-12  Age: 81 y.o. MRN: [id number]  CC: Anemia, Annual Exam, and Urinary Tract Infection   HPI Frank [name] presents for a CPX and f/up -   He was recently treated for COVID-19 infection.  His cough has resolved and he denies fever, chills, chest pain, shortness of breath, or night sweats.  His urine continues to be cloudy.  Patient suffers from dementia, CVA, diabetes with neuropathy, which impairs their ability to perform daily activities like bathing, dressing, feeding, grooming, and toileting in the home.  A cane, crutch, or walker will not resolve issue with performing activities of daily living. A wheelchair will allow patient to safely perform daily activities. Patient can safely propel the wheelchair in the home or has a caregiver who can provide assistance. Length of need Lifetime.   Outpatient Medications Prior to Visit  Medication Sig Dispense Refill   atorvastatin (LIPITOR) 10 MG tablet TAKE ONE-HALF TABLET BY MOUTH  DAILY 50 tablet 2   BRILINTA 90 MG TABS tablet Take 1/2 (one-half) tablet by mouth twice daily 180 tablet 0   Ferric Maltol (ACCRUFER) 30 MG CAPS Take 1 capsule by mouth in the morning and at bedtime. 180 capsule 1   FLUoxetine (PROZAC) 20 MG capsule TAKE 1 CAPSULE BY MOUTH  DAILY 90 capsule 3   FLUoxetine (PROZAC) 40 MG capsule Take 40 mg by mouth daily.     Insulin Pen Needle 32G X 6 MM MISC 1 Act by Does not apply route daily. 100 each 1   lactose free nutrition (BOOST) LIQD Take 237 mLs by mouth 2 (two) times daily between meals.     levETIRAcetam (KEPPRA) 500 MG tablet Take 1 tablet by mouth twice daily 180 tablet 0   methylphenidate (METADATE CD) 10 MG CR capsule Take 1 capsule (10 mg total) by mouth every morning. 90 capsule 0   mineral oil-hydrophilic petrolatum (AQUAPHOR) ointment Apply 1 application topically as needed for dry skin.     PRESCRIPTION MEDICATION CPAP- At bedtime      TOUJEO SOLOSTAR 300 UNIT/ML Solostar Pen INJECT SUBCUTANEOUSLY 10 UNITS  DAILY 4.5 mL 1   No facility-administered medications prior to visit.    ROS Review of Systems  Constitutional: Negative.  Negative for chills and fever.  HENT: Negative.    Eyes: Negative.   Respiratory:  Negative for cough, chest tightness, shortness of breath and wheezing.   Cardiovascular:  Negative for chest pain, palpitations and leg swelling.  Gastrointestinal:  Negative for abdominal pain, constipation, diarrhea, nausea and vomiting.  Endocrine: Negative.   Genitourinary:  Positive for difficulty urinating. Negative for dysuria and hematuria.  Musculoskeletal: Negative.   Skin: Negative.   Allergic/Immunologic: Negative.   Neurological:  Positive for weakness. Negative for dizziness and light-headedness.  Hematological:  Negative for adenopathy. Does not bruise/bleed easily.  Psychiatric/Behavioral: Negative.      Objective:  BP 116/62 (BP Location: Right Arm, Patient Position: Sitting, Cuff Size: Large)   Pulse 71   Temp 97.6 F (36.4 C) (Oral)   Ht '5\' 10"'$  (1.778 m)   SpO2 90%   BMI 25.25 kg/m   BP Readings from Last 3 Encounters:  06/17/22 116/62  06/09/22 (!) 123/51  05/26/22 (!) 108/58    Wt Readings from Last 3 Encounters:  06/09/22 176 lb (79.8 kg)  03/17/22 175 lb (79.4 kg)  12/15/21 176 lb (79.8 kg)    Physical Exam Vitals reviewed.  Constitutional:  General: He is not in acute distress.    Appearance: He is not toxic-appearing or diaphoretic.  HENT:     Nose: Nose normal.     Mouth/Throat:     Mouth: Mucous membranes are moist.  Eyes:     General: No scleral icterus.    Conjunctiva/sclera: Conjunctivae normal.  Cardiovascular:     Rate and Rhythm: Normal rate and regular rhythm.     Heart sounds: No murmur heard. Pulmonary:     Effort: Pulmonary effort is normal.     Breath sounds: No stridor. No wheezing, rhonchi or rales.  Abdominal:     General: Abdomen  is flat.     Palpations: There is no mass.     Tenderness: There is no abdominal tenderness. There is no guarding.     Hernia: No hernia is present.  Musculoskeletal:        General: Normal range of motion.     Cervical back: Neck supple.     Right lower leg: No edema.     Left lower leg: No edema.  Lymphadenopathy:     Cervical: No cervical adenopathy.  Skin:    General: Skin is warm and dry.  Neurological:     Mental Status: He is alert. Mental status is at baseline.  Psychiatric:        Mood and Affect: Mood normal.        Behavior: Behavior normal.     Lab Results  Component Value Date   WBC 6.3 05/26/2022   HGB 12.4 (L) 05/26/2022   HCT 39.1 05/26/2022   PLT 157.0 05/26/2022   GLUCOSE 190 (H) 06/17/2022   CHOL 104 11/19/2020   TRIG 91 11/19/2020   HDL 33 (L) 11/19/2020   LDLCALC 53 11/19/2020   ALT 16 05/26/2022   AST 15 05/26/2022   NA 135 06/17/2022   K 4.0 06/17/2022   CL 100 06/17/2022   CREATININE 1.53 (H) 06/17/2022   BUN 31 (H) 06/17/2022   CO2 30 06/17/2022   TSH 0.459 11/18/2020   PSA 1.08 07/10/2015   INR 0.9 03/30/2021   HGBA1C 7.3 (H) 06/17/2022   MICROALBUR 65.2 (H) 02/17/2022    CT Head Wo Contrast  Result Date: 06/09/2022 CLINICAL DATA:  Head trauma, minor. EXAM: CT HEAD WITHOUT CONTRAST TECHNIQUE: Contiguous axial images were obtained from the base of the skull through the vertex without intravenous contrast. RADIATION DOSE REDUCTION: This exam was performed according to the departmental dose-optimization program which includes automated exposure control, adjustment of the mA and/or kV according to patient size and/or use of iterative reconstruction technique. COMPARISON:  03/26/2021 FINDINGS: Brain: Generalized age related volume loss. No focal abnormality seen affecting the pons or cerebellum. 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 Chronic high-density material in a  right sylvian vascular branch. Old small vessel infarctions of the thalami, basal ganglia and hemispheric white matter. Chronic subdural along the convexity on the left, primarily low-density, but with a small amount of hyperdense blood suggesting minor recent bleeding. Maximal thickness is 8 mm. Small chronic subdural collection on the right with maximal thickness of 7 mm. No acute component suspected on this side. No mass effect or shift. No hydrocephalus. Vascular: No acute vascular finding. Skull: Previous left craniotomy.  Previous right burr hole. Sinuses/Orbits: Clear except for a few opacified ethmoid air cells. Orbits negative. Other: None IMPRESSION: 1. Chronic subdural collection on the left, primarily low-density, but with a  small amount of hyperdense blood suggesting minor recent bleeding. Maximal thickness is 8 mm. No mass effect or shift. 2. Small chronic subdural collection on the right with maximal thickness of 7 mm. No acute component suspected on this side. 3. 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 Old small vessel infarctions of the thalami, basal ganglia and hemispheric white matter. Chronic high-density material in a right Sylvian vascular branch. 4. These results were called by telephone at the time of interpretation on 06/09/2022 at 11:12 am to provider [facility] , who verbally acknowledged these results. Electronically Signed   By: Nelson Chimes M.D.   On: 06/09/2022 11:13    Assessment & Plan:   Zakariye was seen today for anemia, annual exam and urinary tract infection.  Diagnoses and all orders for this visit:  Essential hypertension- His blood pressure is well-controlled. -     Basic metabolic panel; Future -     Basic metabolic panel  Type 2 diabetes mellitus with peripheral neuropathy ([place])- His blood sugar is well-controlled. -     Hemoglobin A1c; Future -     Hemoglobin A1c  Recent  urinary tract infection- Urine culture remains positive for Serratia.  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 -     Urinalysis, Routine w reflex microscopic; Future -     CULTURE, URINE COMPREHENSIVE; Future -     methenamine (HIPREX) 1 g tablet; Take 1 tablet (1 g total) by mouth 2 (two) times daily with a meal. -     CULTURE, URINE COMPREHENSIVE -     Urinalysis, Routine w reflex microscopic  Encounter for general adult medical examination with abnormal findings- Exam completed, labs reviewed, vaccines reviewed and updated, no cancer screenings indicated, patient education was given.  Stage 3b chronic kidney disease ([place])- His renal function is stable.   I am having Frank [name]" start on methenamine. I am also having him maintain his Insulin Pen Needle, PRESCRIPTION MEDICATION, lactose free nutrition, mineral oil-hydrophilic petrolatum, FLUoxetine, methylphenidate, atorvastatin, Brilinta, FLUoxetine, Toujeo SoloStar, ACCRUFeR, and levETIRAcetam.  Meds ordered this encounter  Medications   methenamine (HIPREX) 1 g tablet    Sig: Take 1 tablet (1 g total) by mouth 2 (two) times daily with a meal.    Dispense:  180 tablet    Refill:  1     Follow-up: Return in about 6 months (around 12/17/2022).  Scarlette Calico, MD

## 2022-06-18 LAB — URINALYSIS, ROUTINE W REFLEX MICROSCOPIC
Bilirubin Urine: NEGATIVE
Ketones, ur: NEGATIVE
Nitrite: POSITIVE — AB
Specific Gravity, Urine: 1.025 (ref 1.000–1.030)
Total Protein, Urine: 100 — AB
Urine Glucose: NEGATIVE
Urobilinogen, UA: 0.2 (ref 0.0–1.0)
pH: 6.5 (ref 5.0–8.0)

## 2022-06-20 LAB — CULTURE, URINE COMPREHENSIVE

## 2022-06-22 ENCOUNTER — Other Ambulatory Visit: Payer: Self-pay | Admitting: Internal Medicine

## 2022-06-22 ENCOUNTER — Telehealth: Payer: Self-pay | Admitting: Internal Medicine

## 2022-06-22 DIAGNOSIS — N390 Urinary tract infection, site not specified: Secondary | ICD-10-CM

## 2022-06-22 MED ORDER — CEFPODOXIME PROXETIL 200 MG PO TABS: 200.0000 mg | ORAL_TABLET | Freq: Two times a day (BID) | ORAL | 0 refills | Status: AC

## 2022-06-22 NOTE — Telephone Encounter (Signed)
Patients wife [name] called and wanted to let Dr Ronnald Ramp know that patient is not doing well. He's confused, very weak, wants to sleep all the time, and she said she believes he has a bladder infection. She was advised to call back by Dr Ronnald Ramp if not doing better. Call back is [phone]

## 2022-06-23 ENCOUNTER — Encounter: Payer: Self-pay | Admitting: Internal Medicine

## 2022-06-25 DIAGNOSIS — N390 Urinary tract infection, site not specified: Secondary | ICD-10-CM | POA: Diagnosis not present

## 2022-06-25 DIAGNOSIS — N2581 Secondary hyperparathyroidism of renal origin: Secondary | ICD-10-CM | POA: Diagnosis not present

## 2022-06-25 DIAGNOSIS — D631 Anemia in chronic kidney disease: Secondary | ICD-10-CM | POA: Diagnosis not present

## 2022-06-25 DIAGNOSIS — N1832 Chronic kidney disease, stage 3b: Secondary | ICD-10-CM | POA: Diagnosis not present

## 2022-06-25 DIAGNOSIS — I129 Hypertensive chronic kidney disease with stage 1 through stage 4 chronic kidney disease, or unspecified chronic kidney disease: Secondary | ICD-10-CM | POA: Diagnosis not present

## 2022-06-25 DIAGNOSIS — Z9359 Other cystostomy status: Secondary | ICD-10-CM | POA: Diagnosis not present

## 2022-06-29 ENCOUNTER — Encounter: Payer: Self-pay | Admitting: Internal Medicine

## 2022-06-30 ENCOUNTER — Ambulatory Visit (INDEPENDENT_AMBULATORY_CARE_PROVIDER_SITE_OTHER): Payer: Medicare Other | Admitting: Internal Medicine

## 2022-06-30 ENCOUNTER — Other Ambulatory Visit: Payer: Self-pay

## 2022-06-30 ENCOUNTER — Encounter: Payer: Self-pay | Admitting: Internal Medicine

## 2022-06-30 ENCOUNTER — Emergency Department (HOSPITAL_COMMUNITY)
Admission: EM | Admit: 2022-06-30 | Discharge: 2022-07-01 | Disposition: A | Discharge status: Home / Self Care | Payer: Medicare Other | Attending: Emergency Medicine | Admitting: Emergency Medicine

## 2022-06-30 VITALS — BP 112/70 | HR 67 | Temp 98.1°F | Ht 70.0 in

## 2022-06-30 DIAGNOSIS — E119 Type 2 diabetes mellitus without complications: Secondary | ICD-10-CM | POA: Diagnosis not present

## 2022-06-30 DIAGNOSIS — R410 Disorientation, unspecified: Secondary | ICD-10-CM | POA: Insufficient documentation

## 2022-06-30 DIAGNOSIS — Z79899 Other long term (current) drug therapy: Secondary | ICD-10-CM | POA: Insufficient documentation

## 2022-06-30 DIAGNOSIS — N390 Urinary tract infection, site not specified: Secondary | ICD-10-CM | POA: Diagnosis not present

## 2022-06-30 DIAGNOSIS — R531 Weakness: Secondary | ICD-10-CM

## 2022-06-30 DIAGNOSIS — I10 Essential (primary) hypertension: Secondary | ICD-10-CM | POA: Insufficient documentation

## 2022-06-30 DIAGNOSIS — Z794 Long term (current) use of insulin: Secondary | ICD-10-CM | POA: Diagnosis not present

## 2022-06-30 DIAGNOSIS — N289 Disorder of kidney and ureter, unspecified: Secondary | ICD-10-CM | POA: Diagnosis not present

## 2022-06-30 DIAGNOSIS — E538 Deficiency of other specified B group vitamins: Secondary | ICD-10-CM

## 2022-06-30 DIAGNOSIS — D649 Anemia, unspecified: Secondary | ICD-10-CM | POA: Diagnosis not present

## 2022-06-30 DIAGNOSIS — R82998 Other abnormal findings in urine: Secondary | ICD-10-CM | POA: Diagnosis not present

## 2022-06-30 DIAGNOSIS — T83511A Infection and inflammatory reaction due to indwelling urethral catheter, initial encounter: Secondary | ICD-10-CM | POA: Diagnosis not present

## 2022-06-30 DIAGNOSIS — R350 Frequency of micturition: Secondary | ICD-10-CM | POA: Diagnosis present

## 2022-06-30 LAB — URINALYSIS, ROUTINE W REFLEX MICROSCOPIC
Bilirubin Urine: NEGATIVE
Glucose, UA: NEGATIVE mg/dL
Ketones, ur: NEGATIVE mg/dL
Nitrite: POSITIVE — AB
Protein, ur: 100 mg/dL — AB
Specific Gravity, Urine: 1.026 (ref 1.005–1.030)
WBC, UA: >50 WBC/hpf — ABNORMAL HIGH (ref 0–5)
pH: 5 (ref 5.0–8.0)

## 2022-06-30 LAB — BASIC METABOLIC PANEL
Anion gap: 8 (ref 5–15)
BUN: 31 mg/dL — ABNORMAL HIGH (ref 8–23)
CO2: 26 mmol/L (ref 22–32)
Calcium: 8.6 mg/dL — ABNORMAL LOW (ref 8.9–10.3)
Chloride: 106 mmol/L (ref 98–111)
Creatinine, Ser: 1.45 mg/dL — ABNORMAL HIGH (ref 0.61–1.24)
GFR, Estimated: 48 mL/min — ABNORMAL LOW (ref >60)
Glucose, Bld: 140 mg/dL — ABNORMAL HIGH (ref 70–99)
Potassium: 4 mmol/L (ref 3.5–5.1)
Sodium: 140 mmol/L (ref 135–145)

## 2022-06-30 LAB — CBC
HCT: 38.7 % — ABNORMAL LOW (ref 39.0–52.0)
Hemoglobin: 11.9 g/dL — ABNORMAL LOW (ref 13.0–17.0)
MCH: 26.7 pg (ref 26.0–34.0)
MCHC: 30.7 g/dL (ref 30.0–36.0)
MCV: 87 fL (ref 80.0–100.0)
Platelets: 165 10*3/uL (ref 150–400)
RBC: 4.45 MIL/uL (ref 4.22–5.81)
RDW: 13.3 % (ref 11.5–15.5)
WBC: 7.5 10*3/uL (ref 4.0–10.5)
nRBC: 0 % (ref 0.0–0.2)

## 2022-06-30 MED ORDER — CYANOCOBALAMIN 1000 MCG/ML IJ SOLN
1000.0000 ug | Freq: Once | INTRAMUSCULAR | Status: AC
  Administered 2022-06-30: 1000 ug via INTRAMUSCULAR

## 2022-06-30 NOTE — Telephone Encounter (Signed)
Pt has been scheduled today 12/19 @ 9.30am with Dr. Quay Burow.

## 2022-06-30 NOTE — Patient Instructions (Addendum)
     Go to the ED at [facility] for further evaluation and treatment

## 2022-06-30 NOTE — ED Triage Notes (Signed)
Pt via POV c/o recurrent uti with chronic foley use. Current foley was changed about 3 weeks ago. No pain, afebrile

## 2022-06-30 NOTE — Progress Notes (Signed)
Subjective:    Patient ID: Frank [name], male    DOB: March 03, 1941, 81 y.o.   MRN: [id number]      HPI Frank [name] is here for  Chief Complaint  Patient presents with   Urinary Tract Infection    Was seen last week at [facility] (patient is having confusion, weak and dark urine); Patient has appointment with infectious disease on 07/15/22   He is here with his home health aide and wife.  Because of his dementia his wife provides much of the history.  Urine culture - serratia - 12/6 - reviewed - multidrug resistant.  Placed on methenamine.  Thought to be colonization.   ? UTI  - wife called 12/11 - weak, confused, fatigued, dark urine. Started on vantin 200 mg bid x 1 week.  No change in symptoms.  Last night his urine looked like orange soda.  He is confused intermittently.  He has decreased energy, appetite and overall is weak.  There are no other cold symptoms.  His wife does not feel that his dementia is contributing to some of his confusion.  She states the symptoms are related to high urine infection based on his history.  He did have COVID earlier this month which was mostly fatigue and she feels that has improved.    Medications and allergies reviewed with patient and updated if appropriate.  Current Outpatient Medications on File Prior to Visit  Medication Sig Dispense Refill   atorvastatin (LIPITOR) 10 MG tablet TAKE ONE-HALF TABLET BY MOUTH  DAILY 50 tablet 2   BRILINTA 90 MG TABS tablet Take 1/2 (one-half) tablet by mouth twice daily 180 tablet 0   Ferric Maltol (ACCRUFER) 30 MG CAPS Take 1 capsule by mouth in the morning and at bedtime. 180 capsule 1   FLUoxetine (PROZAC) 20 MG capsule TAKE 1 CAPSULE BY MOUTH  DAILY 90 capsule 3   FLUoxetine (PROZAC) 40 MG capsule Take 40 mg by mouth daily.     Insulin Pen Needle 32G X 6 MM MISC 1 Act by Does not apply route daily. 100 each 1   lactose free nutrition (BOOST) LIQD Take 237 mLs by mouth 2 (two) times daily  between meals.     levETIRAcetam (KEPPRA) 500 MG tablet Take 1 tablet by mouth twice daily 180 tablet 0   methenamine (HIPREX) 1 g tablet Take 1 tablet (1 g total) by mouth 2 (two) times daily with a meal. 180 tablet 1   methylphenidate (METADATE CD) 10 MG CR capsule Take 1 capsule (10 mg total) by mouth every morning. 90 capsule 0   mineral oil-hydrophilic petrolatum (AQUAPHOR) ointment Apply 1 application topically as needed for dry skin.     PRESCRIPTION MEDICATION CPAP- At bedtime     TOUJEO SOLOSTAR 300 UNIT/ML Solostar Pen INJECT SUBCUTANEOUSLY 10 UNITS  DAILY 4.5 mL 1   No current facility-administered medications on file prior to visit.    Review of Systems  Constitutional:  Positive for fatigue. Negative for fever.  HENT:  Positive for postnasal drip. Negative for congestion, sinus pressure and sore throat.   Respiratory:  Negative for cough (residual from covid), shortness of breath and wheezing.   Cardiovascular:  Negative for chest pain, palpitations and leg swelling.  Gastrointestinal:  Negative for abdominal pain, constipation, diarrhea and nausea.  Genitourinary:  Positive for hematuria.       Dark urine  Neurological:  Positive for light-headedness (when he stands - transient). Negative for headaches.  Psychiatric/Behavioral:  Positive for confusion.        Objective:   Vitals:   06/30/22 0943  BP: 112/70  Pulse: 67  Temp: 98.1 F (36.7 C)  SpO2: 97%   BP Readings from Last 3 Encounters:  06/30/22 112/70  06/17/22 116/62  06/09/22 (!) 123/51   Wt Readings from Last 3 Encounters:  06/09/22 176 lb (79.8 kg)  03/17/22 175 lb (79.4 kg)  12/15/21 176 lb (79.8 kg)   Body mass index is 25.25 kg/m.    Physical Exam Constitutional:      General: He is not in acute distress.    Appearance: He is not ill-appearing.     Comments: Frail elderly man in wheelchair  HENT:     Head: Normocephalic and atraumatic.  Eyes:     Conjunctiva/sclera: Conjunctivae  normal.  Cardiovascular:     Rate and Rhythm: Normal rate and regular rhythm.  Pulmonary:     Effort: Pulmonary effort is normal. No respiratory distress.     Breath sounds: No wheezing or rales.  Abdominal:     General: There is no distension.     Palpations: Abdomen is soft.     Tenderness: There is no abdominal tenderness.  Musculoskeletal:     Right lower leg: No edema.     Left lower leg: No edema.  Skin:    General: Skin is warm and dry.  Neurological:     Mental Status: He is alert.            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 Urine culture from 12/6 showed Serratia which has been present consistently for a while-thought to be colonization and was not initially started on antibiotics Symptoms persisted and was started on cefpodoxime 200 mg twice daily x 1 week on 12/11-no change in symptoms Wife is convinced the symptoms are related to UTI-no other infections by history or exam He did just have COVID and that could be contributing some to his decreased appetite, generalized weakness He does have dementia and that could be contributing to some of his intermittent confusion Sees infectious disease 1/3 Started on methenamine earlier this month, but already had the infection Reviewing most recent urine culture and his allergies-there is nothing that I can give him even if we were convinced this was a UTI-would need IV antibiotics Wife is going to take him to [facility] emergency room He did collect a urine sample while in the office and we will have him bring that to the ED so that they can run it there and culture it and follow it up since they will get results  quicker    I spent 32 minutes dedicated to the care of this patient on the date of this encounter including review of recent labs, imaging and procedures, speciality notes, obtaining history, communicating with the patient and wife, and documenting clinical information in the EHR

## 2022-07-01 ENCOUNTER — Ambulatory Visit: Payer: Medicare Other

## 2022-07-01 MED ORDER — CEFDINIR 300 MG PO CAPS: 300.0000 mg | ORAL_CAPSULE | Freq: Two times a day (BID) | ORAL | 0 refills | Status: DC

## 2022-07-01 MED ORDER — CEFDINIR 300 MG PO CAPS
300.0000 mg | ORAL_CAPSULE | Freq: Two times a day (BID) | ORAL | Status: DC
  Administered 2022-07-01: 300 mg via ORAL
  Filled 2022-07-01: qty 1

## 2022-07-01 NOTE — Discharge Instructions (Addendum)
Your urine clearly shows evidence of infection.  The infection that you had last month should have been cleared by the antibiotic you were given.  I have ordered a slightly different antibiotic, but this may need to be changed based on what the culture today shows.  You are likely to continue having infections as long as the catheter stays in place.  I am giving you a referral to an infection specialist who will need to coordinate with your urologist regarding treatment of infections in the setting of chronic use of a catheter.  Return to the emergency department if you start running a fever, start vomiting, or if confusion is worsening.

## 2022-07-01 NOTE — ED Provider Notes (Signed)
[facility] DEPT Provider Note   CSN: [id number] Arrival date & time: 06/30/22  1054     History  Chief Complaint  Patient presents with   Urinary Frequency    Frank [name] is a 81 y.o. male.  The history is provided by a relative. The history is limited by the condition of the patient (Confusion).  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  Family has noted that he is more confused than normal and he is weaker than normal.  He has not run a fever and has not had any vomiting or diarrhea.  He did have a urinary tract infection last month treated with ciprofloxacin and cefuroxime.  Current catheter was inserted 3 weeks ago.   Home Medications Prior to Admission medications   Medication Sig Start Date End Date Taking? Authorizing Provider  cefdinir (OMNICEF) 300 MG capsule Take 1 capsule (300 mg total) by mouth 2 (two) times daily. 46/50/35  Yes Delora Fuel, MD  atorvastatin (LIPITOR) 10 MG tablet TAKE ONE-HALF TABLET BY MOUTH  DAILY 02/15/22   Janith Lima, MD  BRILINTA 90 MG TABS tablet Take 1/2 (one-half) tablet by mouth twice daily 02/27/22   Janith Lima, MD  Ferric Maltol (ACCRUFER) 30 MG CAPS Take 1 capsule by mouth in the morning and at bedtime. 05/15/22   Janith Lima, MD  FLUoxetine (PROZAC) 20 MG capsule TAKE 1 CAPSULE BY MOUTH  DAILY 08/29/21   Janith Lima, MD  FLUoxetine (PROZAC) 40 MG capsule Take 40 mg by mouth daily. 01/26/22   [provider]  Insulin Pen Needle 32G X 6 MM MISC 1 Act by Does not apply route daily. 04/08/20   Janith Lima, MD  lactose free nutrition (BOOST) LIQD Take 237 mLs by mouth 2 (two) times daily between meals.    [provider]  levETIRAcetam (KEPPRA) 500 MG tablet Take 1 tablet by  mouth twice daily 06/01/22   Janith Lima, MD  methenamine (HIPREX) 1 g tablet Take 1 tablet (1 g total) by mouth 2 (two) times daily with a meal. 06/17/22   Janith Lima, MD  methylphenidate (METADATE CD) 10 MG CR capsule Take 1 capsule (10 mg total) by mouth every morning. 01/27/22   Janith Lima, MD  mineral oil-hydrophilic petrolatum (AQUAPHOR) ointment Apply 1 application topically as needed for dry skin.    [provider]  PRESCRIPTION MEDICATION CPAP- At bedtime    [provider]  TOUJEO SOLOSTAR 300 UNIT/ML Solostar Pen INJECT SUBCUTANEOUSLY 10 UNITS  DAILY 04/01/22   Janith Lima, MD      Allergies    Lisinopril, Invokana [canagliflozin], Penicillins, and Sulfamethoxazole    Review of Systems   Review of Systems  Unable to perform ROS: Mental status change  Genitourinary:  Positive for frequency.    Physical Exam Updated Vital Signs BP (!) 173/74   Pulse 67   Temp (!) 97.4 F (36.3 C)   Resp 16   SpO2 97%  Physical Exam Vitals and nursing note reviewed.   81 year old male, resting comfortably and in no acute distress. Vital signs are significant for elevated blood pressure. Oxygen saturation is 97%, which is normal.  Foley catheter is in place and draining clear urine. Head is normocephalic and atraumatic. PERRLA, EOMI. Oropharynx is  clear. Neck is nontender and supple without adenopathy or JVD. Back is nontender and there is no CVA tenderness. Lungs are clear without rales, wheezes, or rhonchi. Chest is nontender. Heart has regular rate and rhythm without murmur. Abdomen is soft, flat, nontender. Extremities have no cyanosis or edema, full range of motion is present. Skin is warm and dry without rash. Neurologic: Awake and alert, slow to answer questions but objectively oriented x 3, cranial nerves are intact, moves all extremities equally.  ED Results / Procedures / Treatments   Labs (all labs ordered are listed, but only abnormal  results are displayed) Labs Reviewed  URINALYSIS, ROUTINE W REFLEX MICROSCOPIC - Abnormal; Notable for the following components:      Result Value   APPearance CLOUDY (*)    Hgb urine dipstick MODERATE (*)    Protein, ur 100 (*)    Nitrite POSITIVE (*)    Leukocytes,Ua LARGE (*)    WBC, UA >50 (*)    Bacteria, UA RARE (*)    All other components within normal limits  CBC - Abnormal; Notable for the following components:   Hemoglobin 11.9 (*)    HCT 38.7 (*)    All other components within normal limits  BASIC METABOLIC PANEL - Abnormal; Notable for the following components:   Glucose, Bld 140 (*)    BUN 31 (*)    Creatinine, Ser 1.45 (*)    Calcium 8.6 (*)    GFR, Estimated 48 (*)    All other components within normal limits  URINE CULTURE    EKG None  Radiology No results found.  Procedures Procedures    Medications Ordered in ED Medications  cefdinir (OMNICEF) capsule 300 mg (has no administration in time range)    ED Course/ Medical Decision Making/ A&P                           Medical Decision Making Amount and/or Complexity of Data Reviewed Labs: ordered.  Risk Prescription drug management.   Weakness and confusion in patient with frequent urinary tract infections and chronic indwelling Foley catheter.  I have reviewed and interpreted his laboratory test, and my interpretation is stable anemia, normal WBC, stable renal insufficiency.  Urinalysis does show clear evidence of UTI with positive nitrite, greater than 50 WBCs with WBC clumps.  Urine culture from 05/26/2022 grew Serratia marcescens which was sensitive to second and third generation cephalosporins as well as trimethoprim-sulfamethoxazole.  Unfortunately, he is allergic to sulfa antibiotics.  Specimen has been sent for culture today and I will try him with a different second-generation cephalosporin.  I am discharging him with prescription for cefdinir and I am referring him back to his urologist.  I  have also referred him to infectious disease.  However, I suspect that as long as he keeps his catheter in, he will be prone to frequent urinary infections.  Final Clinical Impression(s) / ED Diagnoses Final diagnoses:  Urinary tract infection associated with indwelling urethral catheter, initial encounter ([place])  Renal insufficiency  Elevated blood pressure reading with diagnosis of hypertension  Normochromic normocytic anemia    Rx / DC Orders ED Discharge Orders          Ordered    cefdinir (OMNICEF) 300 MG capsule  2 times daily        07/01/22 1610              Delora Fuel, MD 96/04/54 [phone]

## 2022-07-02 ENCOUNTER — Ambulatory Visit: Payer: Medicare Other | Admitting: Internal Medicine

## 2022-07-03 LAB — URINE CULTURE: Culture: >=100000 — AB

## 2022-07-04 ENCOUNTER — Telehealth (HOSPITAL_BASED_OUTPATIENT_CLINIC_OR_DEPARTMENT_OTHER): Payer: Self-pay | Admitting: *Deleted

## 2022-07-04 NOTE — Telephone Encounter (Signed)
Post ED Visit - Positive Culture Follow-up  Culture report reviewed by antimicrobial stewardship pharmacist: [place] Team '[]'$  Elenor Quinones, Pharm.D. '[]'$  Heide Guile, Pharm.D., BCPS AQ-ID '[]'$  Parks Neptune, Pharm.D., BCPS '[]'$  Alycia Rossetti, Pharm.D., BCPS '[]'$  [place], Pharm.D., BCPS, AAHIVP '[]'$  Legrand Como, Pharm.D., BCPS, AAHIVP '[]'$  Salome Arnt, PharmD, BCPS '[]'$  Johnnette Gourd, PharmD, BCPS '[]'$  Hughes Better, PharmD, BCPS '[]'$  Leeroy Cha, PharmD '[]'$  Laqueta Linden, PharmD, BCPS '[]'$  Albertina Parr, PharmD  [place] Team '[]'$  Leodis Sias, PharmD '[]'$  Lindell Spar, PharmD '[]'$  Royetta Asal, PharmD '[]'$  Graylin Shiver, Rph '[]'$  Rema Fendt) Glennon Mac, PharmD '[]'$  Arlyn Dunning, PharmD '[]'$  Netta Cedars, PharmD '[]'$  Dia Sitter, PharmD '[]'$  Leone Haven, PharmD '[]'$  Gretta Arab, PharmD '[]'$  Theodis Shove, PharmD '[]'$  Peggyann Juba, PharmD '[x]'$  Dorena Bodo, PharmD   Positive urine culture Treated with Cefdinir., organism resistant to prescribed antimicrobial  No treatment needed at this time per Aletta Edouard, MD  Frank [name] 07/04/2022, 10:08 AM

## 2022-07-04 NOTE — Progress Notes (Signed)
ED Antimicrobial Stewardship Positive Culture Follow Up   Frank [name] is an 81 y.o. male who presented to [facility] on 06/30/2022 with a chief complaint of  Chief Complaint  Patient presents with   Urinary Frequency    Recent Results (from the past 720 hour(s))  CULTURE, URINE COMPREHENSIVE     Status: Abnormal   Collection Time: 06/17/22  3:34 PM   Specimen: Urine  Result Value Ref Range Status   Source: URINE  Final   Status: FINAL  Final   Isolate 1: Serratia marcescens (A)  Final    Comment: Greater than 100,000 CFU/mL of Serratia marcescens      Susceptibility   Serratia marcescens - CULT, URN, SPECIAL NEGATIVE 1    AMOX/CLAVULANIC >=32 Resistant     CEFAZOLIN* >=64 Resistant      * For uncomplicated UTI caused by E. coli, K. pneumoniae or P. mirabilis: Cefazolin is susceptible if MIC <32 mcg/mL and predicts susceptible to the oral agents cefaclor, cefdinir, cefpodoxime, cefprozil, cefuroxime, cephalexin and loracarbef.     CEFTAZIDIME <=1 Sensitive     CEFEPIME <=1 Sensitive     CEFTRIAXONE 8 Resistant     CIPROFLOXACIN >=4 Resistant     LEVOFLOXACIN >=8 Resistant     GENTAMICIN <=1 Sensitive     NITROFURANTOIN >=512 Resistant     TOBRAMYCIN 2 Sensitive     TRIMETH/SULFA* <=20 Sensitive      * For uncomplicated UTI caused by E. coli, K. pneumoniae or P. mirabilis: Cefazolin is susceptible if MIC <32 mcg/mL and predicts susceptible to the oral agents cefaclor, cefdinir, cefpodoxime, cefprozil, cefuroxime, cephalexin and loracarbef. Legend: S = Susceptible  I = Intermediate R = Resistant  NS = Not susceptible * = Not tested  NR = Not reported **NN = See antimicrobic comments   Urine Culture     Status: Abnormal   Collection Time: 06/30/22  4:48 PM   Specimen: Urine, Catheterized  Result Value Ref Range Status   Specimen Description   Final    URINE, CATHETERIZED Performed at [facility] [address] [place], [place]  [address]    Special Requests   Final    NONE Performed at [facility] [place] [address] [place], [place] [address]    Culture >=100,000 COLONIES/mL SERRATIA MARCESCENS (A)  Final   Report Status 07/03/2022 FINAL  Final   Organism ID, Bacteria SERRATIA MARCESCENS (A)  Final      Susceptibility   Serratia marcescens - MIC*    CEFAZOLIN >=64 RESISTANT Resistant     CEFEPIME 1 SENSITIVE Sensitive     CEFTRIAXONE 32 RESISTANT Resistant     CIPROFLOXACIN >=4 RESISTANT Resistant     GENTAMICIN <=1 SENSITIVE Sensitive     NITROFURANTOIN >=512 RESISTANT Resistant     TRIMETH/SULFA 40 SENSITIVE Sensitive     * >=100,000 COLONIES/mL SERRATIA MARCESCENS    Treated with cefdinir, organism resistant to prescribed antimicrobial  Patient with history of dementia, TBI. Presented to PCP earlier in month, wife reports he has UTI - PCP felt to colonization and did not treat. Returned 12/19 and then referred to ED as he would need IV antibiotics if convinced it was UTI. 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  Patient referred to ID, to be seen 07/15/22.   Defer further treatment at this time.   ED Provider: Aletta Edouard, MD   Tawnya Crook, PharmD, BCPS Clinical Pharmacist 07/04/2022 9:47 AM

## 2022-07-08 ENCOUNTER — Telehealth: Payer: Self-pay | Admitting: Internal Medicine

## 2022-07-08 DIAGNOSIS — F0280 Dementia in other diseases classified elsewhere without behavioral disturbance: Secondary | ICD-10-CM

## 2022-07-08 DIAGNOSIS — I10 Essential (primary) hypertension: Secondary | ICD-10-CM

## 2022-07-08 DIAGNOSIS — I63512 Cerebral infarction due to unspecified occlusion or stenosis of left middle cerebral artery: Secondary | ICD-10-CM

## 2022-07-08 DIAGNOSIS — I63239 Cerebral infarction due to unspecified occlusion or stenosis of unspecified carotid arteries: Secondary | ICD-10-CM

## 2022-07-08 DIAGNOSIS — I700 Atherosclerosis of aorta: Secondary | ICD-10-CM

## 2022-07-08 DIAGNOSIS — E1142 Type 2 diabetes mellitus with diabetic polyneuropathy: Secondary | ICD-10-CM

## 2022-07-08 NOTE — Telephone Encounter (Signed)
Caller & Relationship to patient: [facility] Nurse  Call back number: [phone]   Date of last office visit: 12.6.23  Date of next office visit: 6.6.24  Medication(s) to be refilled:  Pt needs a new wheelchair, one of the brakes has broken on his existing equipment.   Pt is requesting the smallest, lightest weight wheelchair with brakes that is available  Preferred Pharmacy:   [facility]  [phone]   Phone: [phone]  Fax: [phone]

## 2022-07-09 NOTE — Telephone Encounter (Signed)
Iona Coach, Jailin Moomaw, [place]; Verne Carrow; Minus Liberty Received. Thank you!       Previous Messages    ----- Message ----- From: Jari Pigg, CMA Sent: 07/09/2022   9:09 AM EST To: Darlina Guys; Nash Shearer; Minus Liberty  Morning-  I have placed an order for DME wheelchair for this pt.  Please reach out to him or his wife [name] to discuss.  Thanks!  Rayette Mogg, CMA

## 2022-07-09 NOTE — Telephone Encounter (Signed)
DME order has been done  [employer] sent to [employer]

## 2022-07-15 ENCOUNTER — Other Ambulatory Visit: Payer: Self-pay

## 2022-07-15 ENCOUNTER — Encounter: Payer: Self-pay | Admitting: Internal Medicine

## 2022-07-15 ENCOUNTER — Ambulatory Visit: Payer: Medicare Other | Admitting: Internal Medicine

## 2022-07-15 VITALS — BP 121/72 | HR 71 | Temp 97.8°F | Ht 70.0 in | Wt 174.0 lb

## 2022-07-15 DIAGNOSIS — R531 Weakness: Secondary | ICD-10-CM | POA: Insufficient documentation

## 2022-07-15 NOTE — Progress Notes (Signed)
[facility] for Infectious Disease      Reason for Consult:concern for recurrent UTIs    Referring Physician: Dr. Candiss Norse    Patient ID: Frank [name], male    DOB: 1941/01/19, 82 y.o.   MRN: [id number]  HPI:   Frank [name] is here for evaluation with a reported history of multiple urinary tract infections. History is mainly provided by the wife, daughter and caregiver who is here with the patient.  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  He has been seen in many settings with concern for urinary infection and has been on multiple courses of antibiotics.   The family describes the concern for infection are intermittent confusion, generalized weakness and concentrated urine.  They report no fever, no leukocytosis.  He has had a suprapubic catheter for over 1 year and this is changed by the hospice team that follows him.  He has now developed resistance to oral antibiotics and has persistent growth with Serratia in the urine.    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 Authorizing Provider  atorvastatin (LIPITOR) 10 MG tablet TAKE ONE-HALF TABLET BY MOUTH  DAILY 02/15/22  Yes Janith Lima, MD  BRILINTA 90 MG TABS tablet Take 1/2 (one-half) tablet by mouth twice daily 02/27/22  Yes Janith Lima, MD  Docusate Calcium (STOOL SOFTENER PO) Take by  mouth. States "2 stool softeners"   Yes [provider]  Ferric Maltol (ACCRUFER) 30 MG CAPS Take 1 capsule by mouth in the morning and at bedtime. 05/15/22  Yes Janith Lima, MD  FLUoxetine (PROZAC) 40 MG capsule Take 40 mg by mouth daily. 01/26/22  Yes [provider]  Insulin Pen Needle 32G X 6 MM MISC 1 Act by Does not apply route daily. 04/08/20  Yes Janith Lima, MD  levETIRAcetam (KEPPRA) 500 MG tablet Take 1 tablet by mouth twice daily 06/01/22  Yes Janith Lima, MD  methenamine (HIPREX) 1 g tablet Take 1 tablet (1 g total) by mouth 2 (two) times daily with a meal. 06/17/22  Yes Janith Lima, MD  Probiotic Product (PROBIOTIC DAILY PO) Take by mouth.   Yes [provider]  solifenacin (VESICARE) 10 MG tablet Take 10 mg by mouth daily. 07/08/22  Yes [provider]  TOUJEO SOLOSTAR 300 UNIT/ML Solostar Pen INJECT SUBCUTANEOUSLY 10 UNITS  DAILY 04/01/22  Yes Janith Lima, MD  VITAMIN D, CHOLECALCIFEROL, PO Take by mouth.   Yes [provider]  FLUoxetine (PROZAC) 20 MG capsule TAKE 1 CAPSULE BY MOUTH  DAILY Patient not taking: Reported on 07/15/2022 08/29/21   Janith Lima, MD  lactose free nutrition (BOOST) LIQD Take 237 mLs by mouth 2 (two)  times daily between meals.    [provider]  methylphenidate (METADATE CD) 10 MG CR capsule Take 1 capsule (10 mg total) by mouth every morning. Patient not taking: Reported on 07/15/2022 01/27/22   Janith Lima, MD  mineral oil-hydrophilic petrolatum (AQUAPHOR) ointment Apply 1 application topically as needed for dry skin.    [provider]  PRESCRIPTION MEDICATION CPAP- At bedtime    [provider]    Allergies  Allergen Reactions   Lisinopril Cough   Invokana [Canagliflozin] Other (See Comments)    Stopped by MD, unknown reaction    Penicillins Other (See Comments)    Allergic as a child.  Patient does not remember reaction.  Has had cephalasporins without  problems.   Sulfamethoxazole Other (See Comments)    Unknown reaction- from childhood    Social History   Tobacco Use   Smoking status: Former    Packs/day: 1.00    Types: Cigarettes    Quit date: 07/14/1983    Years since quitting: 39.0   Smokeless tobacco: Never  Vaping Use   Vaping Use: Never used  Substance Use Topics   Alcohol use: Not Currently    Alcohol/week: 2.0 standard drinks of alcohol    Types: 2 Standard drinks or equivalent per week    Comment: occassionally   Drug use: No    Family History  Problem Relation Age of Onset   Heart disease Mother    Breast cancer Mother    Stroke Father    Allergies Father        Father and children   Coronary artery disease Brother    Diabetes Neg Hx    Colon cancer Neg Hx      Review of Systems  Constitutional: negative for fevers and chills All other systems reviewed and are negative    Constitutional: in no apparent distress  Vitals:   07/15/22 0952  BP: 121/72  Pulse: 71  Temp: 97.8 F (36.6 C)  SpO2: 96%   EYES: anicteric Respiratory: normal respiratory effort Musculoskeletal: no edema  Labs: Lab Results  Component Value Date   WBC 7.5 06/30/2022   HGB 11.9 (L) 06/30/2022   HCT 38.7 (L) 06/30/2022   MCV 87.0 06/30/2022   PLT 165 06/30/2022    Lab Results  Component Value Date   CREATININE 1.45 (H) 06/30/2022   BUN 31 (H) 06/30/2022   NA 140 06/30/2022   K 4.0 06/30/2022   CL 106 06/30/2022   CO2 26 06/30/2022    Lab Results  Component Value Date   ALT 16 05/26/2022   AST 15 05/26/2022   ALKPHOS 53 05/26/2022   BILITOT 0.6 05/26/2022   INR 0.9 03/30/2021     Assessment:  cognitive and motor decline.  I discussed the symptoms of urinary tract infections including dysuria, frequency and can be associated with fever and leukocytosis.  There is no correlation with cystitis causing confusion or weakness and I do not recommend frequent use of antibiotics without concern for true infection as  this only leads to side effects and resistance, as evidenced by his current sensitivity pattern.   There is also no role or known benefit to attempts to 'eradicate' the colonization as prolonged antibiotics do not rid the colonized bacteria.   No role to check for infection routinely without a concern for a true infection.   Plan: 1)  appropriate use of antibiotics when indicated 2) follow up here as needed.

## 2022-07-22 ENCOUNTER — Telehealth: Payer: Self-pay | Admitting: Internal Medicine

## 2022-07-22 NOTE — Telephone Encounter (Signed)
[facility] called   Patient is requesting a transfer to full hospice from [place] only.  They need Dr. Ronnald Ramp approval  Man[facility] Gunning - [phone]

## 2022-07-22 NOTE — Telephone Encounter (Signed)
LVM for [facility] with verbal to transfer pt to Hospice.

## 2022-07-29 ENCOUNTER — Other Ambulatory Visit: Payer: Self-pay | Admitting: Internal Medicine

## 2022-08-03 ENCOUNTER — Ambulatory Visit: Payer: Medicare Other

## 2022-08-04 ENCOUNTER — Other Ambulatory Visit: Payer: Self-pay | Admitting: Internal Medicine

## 2022-08-12 ENCOUNTER — Ambulatory Visit: Payer: Medicare Other

## 2022-08-12 DIAGNOSIS — I441 Atrioventricular block, second degree: Secondary | ICD-10-CM | POA: Diagnosis not present

## 2022-08-12 LAB — CUP PACEART REMOTE DEVICE CHECK
Battery Remaining Longevity: 129 mo
Battery Voltage: 3.02 V
Brady Statistic AP VP Percent: 0.79 %
Brady Statistic AP VS Percent: 0 %
Brady Statistic AS VP Percent: 99.14 %
Brady Statistic AS VS Percent: 0.07 %
Brady Statistic RA Percent Paced: 0.8 %
Brady Statistic RV Percent Paced: 99.93 %
Date Time Interrogation Session: 20240131004144
Implantable Lead Connection Status: 753985
Implantable Lead Connection Status: 753985
Implantable Lead Implant Date: 20211102
Implantable Lead Implant Date: 20211102
Implantable Lead Location: 753859
Implantable Lead Location: 753860
Implantable Lead Model: 3830
Implantable Lead Model: 5076
Implantable Pulse Generator Implant Date: 20211102
Lead Channel Impedance Value: 361 Ohm
Lead Channel Impedance Value: 437 Ohm
Lead Channel Impedance Value: 513 Ohm
Lead Channel Impedance Value: 665 Ohm
Lead Channel Pacing Threshold Amplitude: 0.625 V
Lead Channel Pacing Threshold Amplitude: 0.875 V
Lead Channel Pacing Threshold Pulse Width: 0.4 ms
Lead Channel Pacing Threshold Pulse Width: 0.4 ms
Lead Channel Sensing Intrinsic Amplitude: 11.5 mV
Lead Channel Sensing Intrinsic Amplitude: 11.5 mV
Lead Channel Sensing Intrinsic Amplitude: 3.75 mV
Lead Channel Sensing Intrinsic Amplitude: 3.75 mV
Lead Channel Setting Pacing Amplitude: 1.5 V
Lead Channel Setting Pacing Amplitude: 2 V
Lead Channel Setting Pacing Pulse Width: 0.4 ms
Lead Channel Setting Sensing Sensitivity: 1.2 mV
Zone Setting Status: 755011

## 2022-09-04 NOTE — Progress Notes (Signed)
Remote pacemaker transmission.   

## 2022-09-16 ENCOUNTER — Telehealth: Payer: Self-pay | Admitting: Internal Medicine

## 2022-09-16 ENCOUNTER — Other Ambulatory Visit: Payer: Self-pay | Admitting: Internal Medicine

## 2022-09-16 DIAGNOSIS — Z87440 Personal history of urinary (tract) infections: Secondary | ICD-10-CM

## 2022-09-16 MED ORDER — METHENAMINE HIPPURATE 1 G PO TABS: 1.0000 g | ORAL_TABLET | Freq: Two times a day (BID) | ORAL | 1 refills | Status: DC

## 2022-09-16 NOTE — Telephone Encounter (Signed)
Patients wife called and said that the generic of HIPREX - Please send this back to [employer].

## 2022-09-19 ENCOUNTER — Other Ambulatory Visit: Payer: Self-pay | Admitting: Internal Medicine

## 2022-09-19 DIAGNOSIS — R569 Unspecified convulsions: Secondary | ICD-10-CM

## 2022-10-02 ENCOUNTER — Telehealth: Payer: Self-pay | Admitting: Internal Medicine

## 2022-10-02 NOTE — Telephone Encounter (Signed)
Patient dropped off document Handicap Placard, to be filled out by provider. Patient requested to send it via Call Patient to pick up within 7-days. Document is located in providers tray at front office.Please advise at Mobile [phone])

## 2022-10-05 NOTE — Telephone Encounter (Signed)
Pt's wife, [name], has been informed that form is signed and ready for pick up at the front desk.

## 2022-10-21 ENCOUNTER — Ambulatory Visit: Payer: Medicare Other | Admitting: Internal Medicine

## 2022-11-11 ENCOUNTER — Ambulatory Visit (INDEPENDENT_AMBULATORY_CARE_PROVIDER_SITE_OTHER)

## 2022-11-11 DIAGNOSIS — I441 Atrioventricular block, second degree: Secondary | ICD-10-CM | POA: Diagnosis not present

## 2022-11-12 LAB — CUP PACEART REMOTE DEVICE CHECK
Battery Remaining Longevity: 125 mo
Battery Voltage: 3.02 V
Brady Statistic AP VP Percent: 0.17 %
Brady Statistic AP VS Percent: 0 %
Brady Statistic AS VP Percent: 99.73 %
Brady Statistic AS VS Percent: 0.09 %
Brady Statistic RA Percent Paced: 0.18 %
Brady Statistic RV Percent Paced: 99.91 %
Date Time Interrogation Session: 20240502013611
Implantable Lead Connection Status: 753985
Implantable Lead Connection Status: 753985
Implantable Lead Implant Date: 20211102
Implantable Lead Implant Date: 20211102
Implantable Lead Location: 753859
Implantable Lead Location: 753860
Implantable Lead Model: 3830
Implantable Lead Model: 5076
Implantable Pulse Generator Implant Date: 20211102
Lead Channel Impedance Value: 342 Ohm
Lead Channel Impedance Value: 418 Ohm
Lead Channel Impedance Value: 494 Ohm
Lead Channel Impedance Value: 665 Ohm
Lead Channel Pacing Threshold Amplitude: 0.625 V
Lead Channel Pacing Threshold Amplitude: 0.875 V
Lead Channel Pacing Threshold Pulse Width: 0.4 ms
Lead Channel Pacing Threshold Pulse Width: 0.4 ms
Lead Channel Sensing Intrinsic Amplitude: 10.5 mV
Lead Channel Sensing Intrinsic Amplitude: 10.5 mV
Lead Channel Sensing Intrinsic Amplitude: 3.125 mV
Lead Channel Sensing Intrinsic Amplitude: 3.125 mV
Lead Channel Setting Pacing Amplitude: 1.5 V
Lead Channel Setting Pacing Amplitude: 2 V
Lead Channel Setting Pacing Pulse Width: 0.4 ms
Lead Channel Setting Sensing Sensitivity: 1.2 mV
Zone Setting Status: 755011

## 2022-12-02 NOTE — Progress Notes (Signed)
Remote pacemaker transmission.   

## 2022-12-17 ENCOUNTER — Ambulatory Visit: Payer: Medicare Other | Admitting: Internal Medicine

## 2023-01-08 ENCOUNTER — Other Ambulatory Visit: Payer: Self-pay | Admitting: Internal Medicine

## 2023-01-08 DIAGNOSIS — R569 Unspecified convulsions: Secondary | ICD-10-CM

## 2023-01-09 ENCOUNTER — Other Ambulatory Visit: Payer: Self-pay | Admitting: Internal Medicine

## 2023-01-09 DIAGNOSIS — Z87440 Personal history of urinary (tract) infections: Secondary | ICD-10-CM

## 2023-02-10 ENCOUNTER — Ambulatory Visit (INDEPENDENT_AMBULATORY_CARE_PROVIDER_SITE_OTHER): Payer: Medicare Other

## 2023-02-10 DIAGNOSIS — I441 Atrioventricular block, second degree: Secondary | ICD-10-CM

## 2023-02-25 NOTE — Progress Notes (Signed)
Remote pacemaker transmission.   

## 2023-04-09 ENCOUNTER — Other Ambulatory Visit: Payer: Self-pay | Admitting: Internal Medicine

## 2023-04-09 DIAGNOSIS — R569 Unspecified convulsions: Secondary | ICD-10-CM

## 2023-04-21 ENCOUNTER — Telehealth: Payer: Self-pay | Admitting: Internal Medicine

## 2023-04-21 NOTE — Telephone Encounter (Signed)
Patient's wife is calling because patient is going into hospice and would like to know what to do with the device the patient has. Please advise.

## 2023-04-21 NOTE — Telephone Encounter (Signed)
Returned call to Pt's wife.  He has been admitted to hospice house.  Advised wife she can drop off his remote monitor to our clinic and we will send back to the company for her.  She states she will drop it off tomorrow.  All remote checks cancelled.

## 2023-05-04 ENCOUNTER — Telehealth: Payer: Self-pay | Admitting: Internal Medicine

## 2023-05-04 NOTE — Telephone Encounter (Signed)
Patient's wife came in to drop off [employer] Device at 2:00 pm because patient is deceased . I will will bring to you or if you are free I have the device in the front for you .

## 2023-05-04 NOTE — Telephone Encounter (Signed)
Mailed remote monitor in return kit.

## 2023-05-12 ENCOUNTER — Ambulatory Visit: Payer: Medicare Other

## 2023-05-14 DEATH — deceased

## 2023-08-11 ENCOUNTER — Ambulatory Visit: Payer: Medicare Other

## 2023-11-10 ENCOUNTER — Ambulatory Visit: Payer: Medicare Other

## 2024-02-09 ENCOUNTER — Ambulatory Visit: Payer: Medicare Other

## 2024-05-10 ENCOUNTER — Ambulatory Visit: Payer: Medicare Other

## 2025-02-12 ENCOUNTER — Other Ambulatory Visit: Payer: Medicare Other | Admitting: Family Medicine
